# Patient Record
Sex: Male | Born: 1935 | Race: White | Hispanic: No | Marital: Married | Smoking: Never smoker
Health system: Southern US, Community
[De-identification: ages and names within clinical notes are randomized; demographics above are authoritative.]

## PROBLEM LIST (undated history)

## (undated) DIAGNOSIS — I471 Supraventricular tachycardia, unspecified: Secondary | ICD-10-CM

## (undated) DIAGNOSIS — H3530 Unspecified macular degeneration: Secondary | ICD-10-CM

## (undated) DIAGNOSIS — J45909 Unspecified asthma, uncomplicated: Secondary | ICD-10-CM

## (undated) DIAGNOSIS — K5732 Diverticulitis of large intestine without perforation or abscess without bleeding: Secondary | ICD-10-CM

## (undated) DIAGNOSIS — I739 Peripheral vascular disease, unspecified: Secondary | ICD-10-CM

## (undated) DIAGNOSIS — I495 Sick sinus syndrome: Secondary | ICD-10-CM

## (undated) DIAGNOSIS — I110 Hypertensive heart disease with heart failure: Secondary | ICD-10-CM

## (undated) DIAGNOSIS — Z952 Presence of prosthetic heart valve: Secondary | ICD-10-CM

## (undated) DIAGNOSIS — R339 Retention of urine, unspecified: Secondary | ICD-10-CM

## (undated) DIAGNOSIS — I38 Endocarditis, valve unspecified: Secondary | ICD-10-CM

## (undated) DIAGNOSIS — M1990 Unspecified osteoarthritis, unspecified site: Secondary | ICD-10-CM

## (undated) DIAGNOSIS — D649 Anemia, unspecified: Secondary | ICD-10-CM

## (undated) DIAGNOSIS — I330 Acute and subacute infective endocarditis: Secondary | ICD-10-CM

## (undated) DIAGNOSIS — T826XXA Infection and inflammatory reaction due to cardiac valve prosthesis, initial encounter: Secondary | ICD-10-CM

## (undated) DIAGNOSIS — I10 Essential (primary) hypertension: Secondary | ICD-10-CM

## (undated) DIAGNOSIS — M545 Low back pain, unspecified: Secondary | ICD-10-CM

## (undated) DIAGNOSIS — E785 Hyperlipidemia, unspecified: Secondary | ICD-10-CM

## (undated) DIAGNOSIS — T462X5A Adverse effect of other antidysrhythmic drugs, initial encounter: Secondary | ICD-10-CM

## (undated) DIAGNOSIS — N184 Chronic kidney disease, stage 4 (severe): Secondary | ICD-10-CM

## (undated) DIAGNOSIS — C679 Malignant neoplasm of bladder, unspecified: Secondary | ICD-10-CM

## (undated) DIAGNOSIS — G4733 Obstructive sleep apnea (adult) (pediatric): Secondary | ICD-10-CM

## (undated) DIAGNOSIS — M549 Dorsalgia, unspecified: Secondary | ICD-10-CM

## (undated) DIAGNOSIS — I5042 Chronic combined systolic (congestive) and diastolic (congestive) heart failure: Secondary | ICD-10-CM

## (undated) DIAGNOSIS — J984 Other disorders of lung: Secondary | ICD-10-CM

## (undated) DIAGNOSIS — J309 Allergic rhinitis, unspecified: Secondary | ICD-10-CM

## (undated) DIAGNOSIS — I2510 Atherosclerotic heart disease of native coronary artery without angina pectoris: Secondary | ICD-10-CM

## (undated) DIAGNOSIS — Z8719 Personal history of other diseases of the digestive system: Secondary | ICD-10-CM

## (undated) DIAGNOSIS — I480 Paroxysmal atrial fibrillation: Secondary | ICD-10-CM

## (undated) DIAGNOSIS — I6529 Occlusion and stenosis of unspecified carotid artery: Secondary | ICD-10-CM

## (undated) DIAGNOSIS — C61 Malignant neoplasm of prostate: Secondary | ICD-10-CM

## (undated) DIAGNOSIS — R942 Abnormal results of pulmonary function studies: Secondary | ICD-10-CM

## (undated) DIAGNOSIS — R55 Syncope and collapse: Secondary | ICD-10-CM

## (undated) DIAGNOSIS — K219 Gastro-esophageal reflux disease without esophagitis: Secondary | ICD-10-CM

## (undated) DIAGNOSIS — D696 Thrombocytopenia, unspecified: Secondary | ICD-10-CM

## (undated) DIAGNOSIS — Z950 Presence of cardiac pacemaker: Secondary | ICD-10-CM

## (undated) DIAGNOSIS — G8929 Other chronic pain: Secondary | ICD-10-CM

## (undated) DIAGNOSIS — R7989 Other specified abnormal findings of blood chemistry: Secondary | ICD-10-CM

## (undated) HISTORY — DX: Hyperlipidemia, unspecified: E78.5

## (undated) HISTORY — DX: Malignant neoplasm of bladder, unspecified: C67.9

## (undated) HISTORY — DX: Supraventricular tachycardia: I47.1

## (undated) HISTORY — PX: INSERTION PROSTATE RADIATION SEED: SUR718

## (undated) HISTORY — DX: Malignant neoplasm of prostate: C61

## (undated) HISTORY — DX: Other chronic pain: G89.29

## (undated) HISTORY — DX: Supraventricular tachycardia, unspecified: I47.10

## (undated) HISTORY — PX: INSERT / REPLACE / REMOVE PACEMAKER: SUR710

## (undated) HISTORY — DX: Obstructive sleep apnea (adult) (pediatric): G47.33

## (undated) HISTORY — DX: Occlusion and stenosis of unspecified carotid artery: I65.29

## (undated) HISTORY — PX: CAROTID ENDARTERECTOMY: SUR193

## (undated) HISTORY — DX: Unspecified macular degeneration: H35.30

## (undated) HISTORY — PX: EYE SURGERY: SHX253

## (undated) HISTORY — DX: Diverticulitis of large intestine without perforation or abscess without bleeding: K57.32

## (undated) HISTORY — PX: APPENDECTOMY: SHX54

## (undated) HISTORY — PX: TONSILLECTOMY: SUR1361

## (undated) HISTORY — PX: CATARACT EXTRACTION W/ INTRAOCULAR LENS  IMPLANT, BILATERAL: SHX1307

## (undated) HISTORY — DX: Paroxysmal atrial fibrillation: I48.0

## (undated) HISTORY — DX: Low back pain: M54.5

## (undated) HISTORY — DX: Low back pain, unspecified: M54.50

## (undated) HISTORY — DX: Unspecified osteoarthritis, unspecified site: M19.90

## (undated) HISTORY — DX: Allergic rhinitis, unspecified: J30.9

## (undated) HISTORY — DX: Atherosclerotic heart disease of native coronary artery without angina pectoris: I25.10

---

## 1979-06-03 HISTORY — PX: COLON SURGERY: SHX602

## 2007-06-03 HISTORY — PX: INGUINAL HERNIA REPAIR: SUR1180

## 2015-04-01 ENCOUNTER — Encounter (HOSPITAL_COMMUNITY): Payer: Self-pay | Admitting: *Deleted

## 2015-04-01 ENCOUNTER — Emergency Department (HOSPITAL_COMMUNITY)
Admission: EM | Admit: 2015-04-01 | Discharge: 2015-04-01 | Disposition: A | Discharge status: Home / Self Care | Payer: Medicare Other | Attending: Emergency Medicine | Admitting: Emergency Medicine

## 2015-04-01 DIAGNOSIS — I10 Essential (primary) hypertension: Secondary | ICD-10-CM | POA: Insufficient documentation

## 2015-04-01 DIAGNOSIS — Z8546 Personal history of malignant neoplasm of prostate: Secondary | ICD-10-CM | POA: Diagnosis not present

## 2015-04-01 DIAGNOSIS — Y92000 Kitchen of unspecified non-institutional (private) residence as  the place of occurrence of the external cause: Secondary | ICD-10-CM | POA: Diagnosis not present

## 2015-04-01 DIAGNOSIS — Z23 Encounter for immunization: Secondary | ICD-10-CM | POA: Insufficient documentation

## 2015-04-01 DIAGNOSIS — Y9389 Activity, other specified: Secondary | ICD-10-CM | POA: Diagnosis not present

## 2015-04-01 DIAGNOSIS — S61411A Laceration without foreign body of right hand, initial encounter: Secondary | ICD-10-CM | POA: Diagnosis not present

## 2015-04-01 DIAGNOSIS — Y998 Other external cause status: Secondary | ICD-10-CM | POA: Insufficient documentation

## 2015-04-01 DIAGNOSIS — W231XXA Caught, crushed, jammed, or pinched between stationary objects, initial encounter: Secondary | ICD-10-CM | POA: Diagnosis not present

## 2015-04-01 HISTORY — DX: Essential (primary) hypertension: I10

## 2015-04-01 MED ORDER — ACETAMINOPHEN 325 MG PO TABS
650.0000 mg | ORAL_TABLET | Freq: Once | ORAL | Status: AC
  Administered 2015-04-01: 650 mg via ORAL
  Filled 2015-04-01: qty 2

## 2015-04-01 MED ORDER — TETANUS-DIPHTH-ACELL PERTUSSIS 5-2.5-18.5 LF-MCG/0.5 IM SUSP
0.5000 mL | Freq: Once | INTRAMUSCULAR | Status: AC
  Administered 2015-04-01: 0.5 mL via INTRAMUSCULAR
  Filled 2015-04-01: qty 0.5

## 2015-04-01 MED ORDER — LIDOCAINE-EPINEPHRINE (PF) 2 %-1:200000 IJ SOLN
10.0000 mL | Freq: Once | INTRAMUSCULAR | Status: AC
  Administered 2015-04-01: 10 mL
  Filled 2015-04-01: qty 20

## 2015-04-01 NOTE — ED Notes (Signed)
Pt is here with laceration to right hand.  Pt was trying to open a kitchen window and got it caught on a seal.  Unknown last tetanus.

## 2015-04-01 NOTE — ED Provider Notes (Signed)
CSN: [id number]     Arrival date & time 04/01/15  1051 History  By signing my name below, I, Clement Sayres, attest that this documentation has been prepared under the direction and in the presence of Lexi Conaty, PA-C.  Electronically Signed: Clement Sayres, ED Scribe. 04/01/2015. 12:33 PM.   Chief Complaint  Patient presents with  . Laceration   The history is provided by the patient. No language interpreter was used.   HPI Comments: Ryan [name] is a 81 y.o. male who presents to the Emergency Department complaining of non-painful laceration on dorsum of right hand that occurred PTA (bleeding controlled). Pt has not tried any treatments. Pt was opening a window with both arms outstretched. He was pulled off balance and his right hand was caught on the window seal. Pt is unsure when last tetanus shot was. Pt denies weakness, numbness, or any other injury.  He is not on blood thinners.   Past Medical History  Diagnosis Date  . Cancer [facility])     prostate  . Hypertension    Past Surgical History  Procedure Laterality Date  . Carotid endarterectomy      bilateral  . Appendectomy     No family history on file. Social History  Substance Use Topics  . Smoking status: Never Smoker   . Smokeless tobacco: None  . Alcohol Use: No    Review of Systems  Constitutional: Negative for fever.  Musculoskeletal: Negative for arthralgias.  Skin: Positive for wound. Negative for color change.  Allergic/Immunologic: Negative for immunocompromised state.  Neurological: Negative for weakness and numbness.  Hematological: Does not bruise/bleed easily.  Psychiatric/Behavioral: Positive for self-injury (accidental).      Allergies  Ciprofloxacin  Home Medications   Prior to Admission medications   Not on File   BP 152/71 mmHg  Pulse 71  Temp(Src) 97.5 F (36.4 C) (Oral)  Resp 18  SpO2 100% Physical Exam  Constitutional: He appears well-developed and well-nourished. No distress.   HENT:  Head: Normocephalic and atraumatic.  Neck: Neck supple.  Pulmonary/Chest: Effort normal.  Musculoskeletal:  Superficial laceration/skin tear across dorsum of right hand,  Full active ROM 5/5 in all five fingers Sensation in tact, cap refill < 3 seconds, small abrasion/skin tear on dorsum of left hand.  Neurological: He is alert.  Skin: He is not diaphoretic.  Nursing note and vitals reviewed.   ED Course  Procedures DIAGNOSTIC STUDIES: Oxygen Saturation is 100% on RA, normal by my interpretation.    12:16 PM COORDINATION OF CARE: Discussed treatment plan of updating tetanus and laceration pair. Pt agreed to plan.   Labs Review Labs Reviewed - No data to display  Imaging Review No results found. I have personally reviewed and evaluated these images and lab results as part of my medical decision-making.   EKG Interpretation None       12:11 PM Dr Oleta Mouse made aware of the patient.  She will also see and examine patient.   LACERATION REPAIR Performed by: Clayton Bibles Authorized by: Clayton Bibles Consent: Verbal consent obtained. Risks and benefits: risks, benefits and alternatives were discussed Consent given by: patient Patient identity confirmed: provided demographic data Prepped and Draped in normal sterile fashion Wound explored  Laceration Location: dorsal right hand  Laceration Length: 5cm  No Foreign Bodies seen or palpated  Anesthesia: local infiltration  Local anesthetic: lidocaine 2% with epinephrine  Anesthetic total: 5 ml  Irrigation method: syringe Amount of cleaning: standard  Skin closure: 5-0 vicryl plus  Number of sutures: 8  Technique: simple interrupted  Patient tolerance: Patient tolerated the procedure well with no immediate complications.   MDM   Final diagnoses:  Hand laceration, right, initial encounter    Afebrile, nontoxic patient with laceration to right dorsal hand without tendon involvement.  Neurovascularly intact.   Sutures placed in ED.   D/C home with wound care instructions, return precautions.  Discussed result, findings, treatment, and follow up  with patient.  Pt given return precautions.  Pt verbalizes understanding and agrees with plan.       I personally performed the services described in this documentation, which was scribed in my presence. The recorded information has been reviewed and is accurate.    [place], PA-C 04/01/15 [place] Liu, MD 04/01/15 [phone]

## 2015-04-01 NOTE — ED Notes (Signed)
Suture cart at bedside 

## 2015-04-01 NOTE — ED Notes (Signed)
Patient advised no eating or drinking until evaluated by PA

## 2015-04-01 NOTE — ED Notes (Signed)
Declined W/C at D/C and was escorted to lobby by RN. 

## 2015-04-01 NOTE — Discharge Instructions (Signed)
Read the information below.  You may return to the Emergency Department at any time for worsening condition or any new symptoms that concern you.  If you develop redness, swelling, pus draining from the wound, or fevers greater than 100.4, return to the ER immediately for a recheck.    The sutures are absorbable.  If you prefer to have them taken out you may see your primary care provider, the urgent care, or emergency department in 12-14 days for a wound check and suture removal.     Laceration Care, Adult A laceration is a cut that goes through all of the layers of the skin and into the tissue that is right under the skin. Some lacerations heal on their own. Others need to be closed with stitches (sutures), staples, skin adhesive strips, or skin glue. Proper laceration care minimizes the risk of infection and helps the laceration to heal better. HOW TO CARE FOR YOUR LACERATION If sutures or staples were used:  Keep the wound clean and dry.  If you were given a bandage (dressing), you should change it at least one time per day or as told by your health care provider. You should also change it if it becomes wet or dirty.  Keep the wound completely dry for the first 24 hours or as told by your health care provider. After that time, you may shower or bathe. However, make sure that the wound is not soaked in water until after the sutures or staples have been removed.  Clean the wound one time each day or as told by your health care provider:  Wash the wound with soap and water.  Rinse the wound with water to remove all soap.  Pat the wound dry with a clean towel. Do not rub the wound.  After cleaning the wound, apply a thin layer of antibiotic ointmentas told by your health care provider. This will help to prevent infection and keep the dressing from sticking to the wound.  Have the sutures or staples removed as told by your health care provider. If skin adhesive strips were used:  Keep  the wound clean and dry.  If you were given a bandage (dressing), you should change it at least one time per day or as told by your health care provider. You should also change it if it becomes dirty or wet.  Do not get the skin adhesive strips wet. You may shower or bathe, but be careful to keep the wound dry.  If the wound gets wet, pat it dry with a clean towel. Do not rub the wound.  Skin adhesive strips fall off on their own. You may trim the strips as the wound heals. Do not remove skin adhesive strips that are still stuck to the wound. They will fall off in time. If skin glue was used:  Try to keep the wound dry, but you may briefly wet it in the shower or bath. Do not soak the wound in water, such as by swimming.  After you have showered or bathed, gently pat the wound dry with a clean towel. Do not rub the wound.  Do not do any activities that will make you sweat heavily until the skin glue has fallen off on its own.  Do not apply liquid, cream, or ointment medicine to the wound while the skin glue is in place. Using those may loosen the film before the wound has healed.  If you were given a bandage (dressing), you should change  it at least one time per day or as told by your health care provider. You should also change it if it becomes dirty or wet.  If a dressing is placed over the wound, be careful not to apply tape directly over the skin glue. Doing that may cause the glue to be pulled off before the wound has healed.  Do not pick at the glue. The skin glue usually remains in place for 5-10 days, then it falls off of the skin. General Instructions  Take over-the-counter and prescription medicines only as told by your health care provider.  If you were prescribed an antibiotic medicine or ointment, take or apply it as told by your doctor. Do not stop using it even if your condition improves.  To help prevent scarring, make sure to cover your wound with sunscreen whenever you  are outside after stitches are removed, after adhesive strips are removed, or when glue remains in place and the wound is healed. Make sure to wear a sunscreen of at least 30 SPF.  Do not scratch or pick at the wound.  Keep all follow-up visits as told by your health care provider. This is important.  Check your wound every day for signs of infection. Watch for:  Redness, swelling, or pain.  Fluid, blood, or pus.  Raise (elevate) the injured area above the level of your heart while you are sitting or lying down, if possible. SEEK MEDICAL CARE IF:  You received a tetanus shot and you have swelling, severe pain, redness, or bleeding at the injection site.  You have a fever.  A wound that was closed breaks open.  You notice a bad smell coming from your wound or your dressing.  You notice something coming out of the wound, such as wood or glass.  Your pain is not controlled with medicine.  You have increased redness, swelling, or pain at the site of your wound.  You have fluid, blood, or pus coming from your wound.  You notice a change in the color of your skin near your wound.  You need to change the dressing frequently due to fluid, blood, or pus draining from the wound.  You develop a new rash.  You develop numbness around the wound. SEEK IMMEDIATE MEDICAL CARE IF:  You develop severe swelling around the wound.  Your pain suddenly increases and is severe.  You develop painful lumps near the wound or on skin that is anywhere on your body.  You have a red streak going away from your wound.  The wound is on your hand or foot and you cannot properly move a finger or toe.  The wound is on your hand or foot and you notice that your fingers or toes look pale or bluish.   This information is not intended to replace advice given to you by your health care provider. Make sure you discuss any questions you have with your health care provider.   Document Released: 05/19/2005  Document Revised: 10/03/2014 Document Reviewed: 05/15/2014 Elsevier Interactive Patient Education [employer].

## 2015-07-04 DIAGNOSIS — L570 Actinic keratosis: Secondary | ICD-10-CM | POA: Diagnosis not present

## 2015-07-04 DIAGNOSIS — D1722 Benign lipomatous neoplasm of skin and subcutaneous tissue of left arm: Secondary | ICD-10-CM | POA: Diagnosis not present

## 2015-07-04 DIAGNOSIS — L820 Inflamed seborrheic keratosis: Secondary | ICD-10-CM | POA: Diagnosis not present

## 2015-07-04 DIAGNOSIS — L821 Other seborrheic keratosis: Secondary | ICD-10-CM | POA: Diagnosis not present

## 2015-07-04 DIAGNOSIS — D1801 Hemangioma of skin and subcutaneous tissue: Secondary | ICD-10-CM | POA: Diagnosis not present

## 2015-08-07 DIAGNOSIS — H2513 Age-related nuclear cataract, bilateral: Secondary | ICD-10-CM | POA: Diagnosis not present

## 2015-08-07 DIAGNOSIS — H401222 Low-tension glaucoma, left eye, moderate stage: Secondary | ICD-10-CM | POA: Diagnosis not present

## 2015-08-07 DIAGNOSIS — H401213 Low-tension glaucoma, right eye, severe stage: Secondary | ICD-10-CM | POA: Diagnosis not present

## 2015-08-07 DIAGNOSIS — H353131 Nonexudative age-related macular degeneration, bilateral, early dry stage: Secondary | ICD-10-CM | POA: Diagnosis not present

## 2015-08-07 DIAGNOSIS — H52223 Regular astigmatism, bilateral: Secondary | ICD-10-CM | POA: Diagnosis not present

## 2015-09-25 ENCOUNTER — Telehealth: Payer: Self-pay | Admitting: Cardiology

## 2015-09-25 NOTE — Progress Notes (Signed)
Patient ID: Ryan [name], male   DOB: August 28, 1929, 82 y.o.   MRN: [id number]     Cardiology Office Note    Date:  09/26/2015   ID:  Ryan [name], DOB 08-30-1929, MRN [id number]  PCP:  Velna Hatchet, MD  Cardiologist:  Dorothy Spark, MD   Chief complain: Fatigue and dyspnea on exertion.  History of Present Illness:  Ryan [name] is a 82 y.o. male who is a very pleasant patient who despite his age remains very active. He is a former runner who is now struggling with multiple comorbidities and arthritis but he continues to walk with a walker 2 miles a day. Ryan [name] has a history of peripheral arterial disease with carotid endarterectomy bilaterally for severe stenosis in 1997, hypertension, hyperlipidemia, history of SVT, history of aortic stenosis mild to moderate on echocardiogram in 2015, obstructive sleep apnea that intolerance to Cipro admission as the patient is claustrophobic, patient also has known nonobstructive coronary artery disease with 2 cardiac caths in the past last done in 2013 that showed diffusely calcified vessel with mild left main disease, 40% LAD disease, 60% diffuse circumflex disease and 60% diffuse RCA disease, his LVEF was preserved estimated at 65%. Lexiscan stress test in October 2015 was negative for prior infarction or ischemia and showed persistent normal LVEF. Patient also has history of mildly to moderately elevated pulmonary pressures. He was intolerance to carvedilol, metoprolol, and losartan in the past with a drop in his blood pressure down to 90s. The patient states that on 2 occasions he went to the hospital when he developed chest pain associated pain in his neck that responded to PPIs and was believed to be related to his gastric reflux. He currently continues to be very active but is very limited Ryan [name] than what he is usually used to NSAIDs that it's progressively getting worse to the point when he has to make her frequent stops. He is currently not  on any statin, his last cholesterol measurement was in 2012 with LDL 73 triglycerides 131 and HDL 65. He denies any orthopnea, paroxysmal nocturnal dyspnea no lower extremity edema.   Past Medical History  Diagnosis Date  . Prostate cancer ([place])   . Hypertension   . HLD (hyperlipidemia)   . OSA (obstructive sleep apnea)     intolerant to CPAP  . Age-related macular degeneration   . Aortic valve stenosis   . Coronary arteriosclerosis in native artery   . Aortic valve disorder   . Paroxysmal supraventricular tachycardia ([place])   . Carotid artery obstruction   . Allergic rhinitis   . Diverticulitis of colon   . Chronic lower back pain   . Nocturnal hypoxemia   . DJD (degenerative joint disease)   . History of elevated PSA 05/2009  . Primary malignant neoplasm of bladder ([place])   . Dysthymia     Past Surgical History  Procedure Laterality Date  . Carotid endarterectomy      bilateral  . Appendectomy    . Hernia repair Right 2009  . Abdominal surgery  1981    Meckles Diverticulum with volvulus    Current Medications: Outpatient Prescriptions Prior to Visit  Medication Sig Dispense Refill  . amLODipine (NORVASC) 10 MG tablet Take 10 mg by mouth daily.    Marland Kitchen aspirin 81 MG tablet Take 81 mg by mouth daily.    . cholecalciferol (VITAMIN D) 1000 units tablet Take 1,000 Units by mouth daily.    . Coenzyme Q10 (COQ-10 PO) Take 1  capsule by mouth daily.    . Dorzolamide HCl-Timolol Mal PF 22.3-6.8 MG/ML SOLN Place 1 drop into both eyes at bedtime.    Marland Kitchen latanoprost (XALATAN) 0.005 % ophthalmic solution Place 1 drop into both eyes at bedtime.    Marland Kitchen lisinopril (PRINIVIL,ZESTRIL) 20 MG tablet Take 20 mg by mouth daily.    . Probiotic Product (PROBIOTIC PO) Take 1 tablet by mouth daily.    Marland Kitchen triamcinolone cream (KENALOG) 0.1 % Apply on to skin twice daily  6   No facility-administered medications prior to visit.     Allergies:   Hydrochlorothiazide; Ace inhibitors; Ciprofloxacin;  Losartan; and Sulfa antibiotics   Social History   Social History  . Marital Status: Married    Spouse Name: N/A  . Number of Children: N/A  . Years of Education: N/A   Social History Main Topics  . Smoking status: Never Smoker   . Smokeless tobacco: Never Used  . Alcohol Use: No  . Drug Use: No  . Sexual Activity: Not Asked   Other Topics Concern  . None   Social History Narrative     Family History:  The patient's family history includes Heart disease in his father and mother.   ROS:   Please see the history of present illness.    ROS All other systems reviewed and are negative.   PHYSICAL EXAM:   VS:  BP 160/66 mmHg  Pulse 66  Ht 5\' 7"  (1.702 m)  Wt 128 lb (58.06 kg)  BMI 20.04 kg/m2   GEN: Well nourished, well developed, in no acute distress HEENT: normal Neck: no JVD, carotid bruits, or masses Cardiac: RRR; 4/6 holosystolic murmurs, very soft S2, rubs, or gallops,no edema  Respiratory:  clear to auscultation bilaterally, normal work of breathing GI: soft, nontender, nondistended, + BS MS: no deformity or atrophy Skin: warm and dry, no rash Neuro:  Alert and Oriented x 3, Strength and sensation are intact Psych: euthymic mood, full affect  Wt Readings from Last 3 Encounters:  09/26/15 128 lb (58.06 kg)    Studies/Labs Reviewed:   EKG:  EKG is ordered today.  The ekg ordered today demonstrates Sinus rhythm with ventricular related 66 bpm, right axis deviation, left bundle branch block, there is no prior EKG available for comparison.  Recent Labs: No results found for requested labs within last 365 days.   Lipid Panel as described in history of present illness  Additional studies/ records that were reviewed today include:  I have reviewed his prior medical records including office visits, cardiac cath, stress test, and echocardiogram. All described in history of present illness.    ASSESSMENT:    1. Aortic stenosis   2. Essential hypertension     3. Coronary artery disease involving native coronary artery of native heart without angina pectoris    4. Peripheral arterial disease 5. Hyperlipidemia  PLAN:  In order of problems listed above:  1. Coronary artery disease - known nonobstructive, he is on medication with aspirin lisinopril, no beta blockers at as he is bradycardic at baseline and didn't tolerate in the past. He is not on statin most probably secondary to memory impairment in the past. His EKG shows left bundle branch block that was previously not described, as of now I want to rule out severe aortic stenosis that might be responsible for his symptoms of worsening exercise capacity and shortness of breath on exertion if he doesn't have aortic stenosis that is significant for causing the symptoms I  will consider ischemic workup in the future.  2. Aortic stenosis - mild to moderate on the echocardiogram in 2015, however loud murmur with worsening symptoms, and soft S2 we will repeat echocardiogram to evaluate.  3. Hypertension - most probably white coat syndrome as he states it's always in 130s at home and history of hypotension in the past for now I'll just follow current medical regimen  4. Hyperlipidemia - acceptable lipids in 2012, he is going to have them repeated today we will follow. No statins as he had memory impairment in the past.  5. Carotid disease status post endarterectomy in 1997, he currently has no bruits and no significant dizziness.  6. Left bundle branch block those previous not described on prior EKGs will consider ischemic workup in the future if other causes for his symptoms.  Medication Adjustments/Labs and Tests Ordered: Current medicines are reviewed at length with the patient today.  Concerns regarding medicines are outlined above.  Medication changes, Labs and Tests ordered today are listed in the Patient Instructions below. Patient Instructions  Medication Instructions:  Your physician recommends  that you continue on your current medications as directed. Please refer to the Current Medication list given to you today.   Labwork: Please have a copy of your labs ordered by Dr.Holwerda forwarded to our office Fax # [phone]  Testing/Procedures: Your physician has requested that you have an echocardiogram. Echocardiography is a painless test that uses sound waves to create images of your heart. It provides your doctor with information about the size and shape of your heart and how well your heart's chambers and valves are working. This procedure takes approximately one hour. There are no restrictions for this procedure.    Follow-Up: Your physician recommends that you schedule a follow-up appointment in:  Dr.Nelsons next available or 2 months  Any Other Special Instructions Will Be Listed Below (If Applicable).     If you need a refill on your cardiac medications before your next appointment, please call your pharmacy.       Signed, Dorothy Spark, MD  09/26/2015 10:20 AM    [facility] [address], [place], [place]  [address] Phone: [phone]; Fax: [phone]

## 2015-09-25 NOTE — Telephone Encounter (Signed)
Records rec From [facility] appointment 4/26 with Dr.Nelson gave to [facility]

## 2015-09-26 ENCOUNTER — Encounter: Payer: Self-pay | Admitting: Cardiology

## 2015-09-26 ENCOUNTER — Encounter: Payer: Self-pay | Admitting: *Deleted

## 2015-09-26 ENCOUNTER — Ambulatory Visit (INDEPENDENT_AMBULATORY_CARE_PROVIDER_SITE_OTHER): Payer: Medicare Other | Admitting: Cardiology

## 2015-09-26 VITALS — BP 160/66 | HR 66 | Ht 67.0 in | Wt 128.0 lb

## 2015-09-26 DIAGNOSIS — E785 Hyperlipidemia, unspecified: Secondary | ICD-10-CM | POA: Diagnosis not present

## 2015-09-26 DIAGNOSIS — I350 Nonrheumatic aortic (valve) stenosis: Secondary | ICD-10-CM | POA: Diagnosis not present

## 2015-09-26 DIAGNOSIS — I2510 Atherosclerotic heart disease of native coronary artery without angina pectoris: Secondary | ICD-10-CM | POA: Diagnosis not present

## 2015-09-26 DIAGNOSIS — I10 Essential (primary) hypertension: Secondary | ICD-10-CM

## 2015-09-26 DIAGNOSIS — I447 Left bundle-branch block, unspecified: Secondary | ICD-10-CM

## 2015-09-26 DIAGNOSIS — Z125 Encounter for screening for malignant neoplasm of prostate: Secondary | ICD-10-CM | POA: Diagnosis not present

## 2015-09-26 DIAGNOSIS — I779 Disorder of arteries and arterioles, unspecified: Secondary | ICD-10-CM

## 2015-09-26 DIAGNOSIS — I739 Peripheral vascular disease, unspecified: Secondary | ICD-10-CM

## 2015-09-26 NOTE — Patient Instructions (Addendum)
Medication Instructions:  Your physician recommends that you continue on your current medications as directed. Please refer to the Current Medication list given to you today.   Labwork: Please have a copy of your labs ordered by Dr.Holwerda forwarded to our office Fax # [phone]  Testing/Procedures: Your physician has requested that you have an echocardiogram. Echocardiography is a painless test that uses sound waves to create images of your heart. It provides your doctor with information about the size and shape of your heart and how well your heart's chambers and valves are working. This procedure takes approximately one hour. There are no restrictions for this procedure.    Follow-Up: Your physician recommends that you schedule a follow-up appointment in:  Dr.Nelsons next available or 2 months  Any Other Special Instructions Will Be Listed Below (If Applicable).     If you need a refill on your cardiac medications before your next appointment, please call your pharmacy.

## 2015-10-01 DIAGNOSIS — Z0000 Encounter for general adult medical examination without abnormal findings: Secondary | ICD-10-CM | POA: Diagnosis not present

## 2015-10-01 DIAGNOSIS — C61 Malignant neoplasm of prostate: Secondary | ICD-10-CM | POA: Diagnosis not present

## 2015-10-01 DIAGNOSIS — C671 Malignant neoplasm of dome of bladder: Secondary | ICD-10-CM | POA: Diagnosis not present

## 2015-10-03 DIAGNOSIS — H3530 Unspecified macular degeneration: Secondary | ICD-10-CM | POA: Diagnosis not present

## 2015-10-03 DIAGNOSIS — I350 Nonrheumatic aortic (valve) stenosis: Secondary | ICD-10-CM | POA: Diagnosis not present

## 2015-10-03 DIAGNOSIS — I10 Essential (primary) hypertension: Secondary | ICD-10-CM | POA: Diagnosis not present

## 2015-10-03 DIAGNOSIS — H4089 Other specified glaucoma: Secondary | ICD-10-CM | POA: Diagnosis not present

## 2015-10-03 DIAGNOSIS — I447 Left bundle-branch block, unspecified: Secondary | ICD-10-CM | POA: Diagnosis not present

## 2015-10-03 DIAGNOSIS — I6529 Occlusion and stenosis of unspecified carotid artery: Secondary | ICD-10-CM | POA: Diagnosis not present

## 2015-10-03 DIAGNOSIS — Z0000 Encounter for general adult medical examination without abnormal findings: Secondary | ICD-10-CM | POA: Diagnosis not present

## 2015-10-03 DIAGNOSIS — Z8546 Personal history of malignant neoplasm of prostate: Secondary | ICD-10-CM | POA: Diagnosis not present

## 2015-10-03 DIAGNOSIS — M1990 Unspecified osteoarthritis, unspecified site: Secondary | ICD-10-CM | POA: Diagnosis not present

## 2015-10-03 DIAGNOSIS — I25118 Atherosclerotic heart disease of native coronary artery with other forms of angina pectoris: Secondary | ICD-10-CM | POA: Diagnosis not present

## 2015-10-05 ENCOUNTER — Other Ambulatory Visit (HOSPITAL_COMMUNITY): Payer: Medicare Other

## 2015-10-10 ENCOUNTER — Other Ambulatory Visit: Payer: Self-pay

## 2015-10-10 ENCOUNTER — Ambulatory Visit (HOSPITAL_COMMUNITY): Payer: Medicare Other | Attending: Internal Medicine

## 2015-10-10 DIAGNOSIS — I350 Nonrheumatic aortic (valve) stenosis: Secondary | ICD-10-CM | POA: Diagnosis not present

## 2015-10-10 DIAGNOSIS — Z8249 Family history of ischemic heart disease and other diseases of the circulatory system: Secondary | ICD-10-CM | POA: Diagnosis not present

## 2015-10-10 DIAGNOSIS — I2510 Atherosclerotic heart disease of native coronary artery without angina pectoris: Secondary | ICD-10-CM | POA: Insufficient documentation

## 2015-10-10 DIAGNOSIS — I071 Rheumatic tricuspid insufficiency: Secondary | ICD-10-CM | POA: Insufficient documentation

## 2015-10-10 DIAGNOSIS — E785 Hyperlipidemia, unspecified: Secondary | ICD-10-CM | POA: Diagnosis not present

## 2015-10-10 DIAGNOSIS — I447 Left bundle-branch block, unspecified: Secondary | ICD-10-CM | POA: Insufficient documentation

## 2015-10-10 DIAGNOSIS — I340 Nonrheumatic mitral (valve) insufficiency: Secondary | ICD-10-CM | POA: Diagnosis not present

## 2015-10-10 DIAGNOSIS — G4733 Obstructive sleep apnea (adult) (pediatric): Secondary | ICD-10-CM | POA: Insufficient documentation

## 2015-10-10 DIAGNOSIS — I10 Essential (primary) hypertension: Secondary | ICD-10-CM | POA: Diagnosis not present

## 2015-10-10 DIAGNOSIS — I119 Hypertensive heart disease without heart failure: Secondary | ICD-10-CM | POA: Diagnosis not present

## 2015-10-12 ENCOUNTER — Telehealth: Payer: Self-pay | Admitting: *Deleted

## 2015-10-12 DIAGNOSIS — I350 Nonrheumatic aortic (valve) stenosis: Secondary | ICD-10-CM | POA: Insufficient documentation

## 2015-10-12 NOTE — Telephone Encounter (Signed)
Notified the pt that per Dr Meda Coffee, his echo showed that his LVEF is normal, aortic stenosis is now moderate, no intervention needed, and we will follow with an echo in 1 year.  Informed the pt that I will place the order for the echo in the system, and have one of our [facility] schedulers call the pt back to schedule his echo for one year out.  Pt verbalized understanding and agrees with this plan.

## 2015-10-12 NOTE — Telephone Encounter (Signed)
-----   Message from Dorothy Spark, MD sent at 10/10/2015 10:42 PM EDT ----- LVEF is normal, aortic stenosis is now moderate, no intervention needed, we will follow with an echo in 1 year

## 2015-10-31 DIAGNOSIS — M25561 Pain in right knee: Secondary | ICD-10-CM | POA: Diagnosis not present

## 2015-10-31 DIAGNOSIS — M1711 Unilateral primary osteoarthritis, right knee: Secondary | ICD-10-CM | POA: Diagnosis not present

## 2015-11-23 ENCOUNTER — Ambulatory Visit: Payer: Medicare Other | Admitting: Cardiology

## 2015-11-28 ENCOUNTER — Ambulatory Visit: Payer: Medicare Other | Admitting: Cardiology

## 2015-12-11 DIAGNOSIS — H353131 Nonexudative age-related macular degeneration, bilateral, early dry stage: Secondary | ICD-10-CM | POA: Diagnosis not present

## 2015-12-11 DIAGNOSIS — H401213 Low-tension glaucoma, right eye, severe stage: Secondary | ICD-10-CM | POA: Diagnosis not present

## 2015-12-11 DIAGNOSIS — H52223 Regular astigmatism, bilateral: Secondary | ICD-10-CM | POA: Diagnosis not present

## 2015-12-11 DIAGNOSIS — H401222 Low-tension glaucoma, left eye, moderate stage: Secondary | ICD-10-CM | POA: Diagnosis not present

## 2015-12-11 DIAGNOSIS — H2513 Age-related nuclear cataract, bilateral: Secondary | ICD-10-CM | POA: Diagnosis not present

## 2015-12-26 DIAGNOSIS — G8929 Other chronic pain: Secondary | ICD-10-CM | POA: Diagnosis not present

## 2015-12-26 DIAGNOSIS — M25561 Pain in right knee: Secondary | ICD-10-CM | POA: Diagnosis not present

## 2016-01-07 ENCOUNTER — Telehealth: Payer: Self-pay | Admitting: *Deleted

## 2016-01-07 NOTE — Telephone Encounter (Signed)
Spoke with the pt and informed him that Dr Meda Coffee spoke with his son-in-law and informed him of her recommendations for him to have a left cardiac cath done, with labs prior too, for recent symptoms.  Pt states that he is agreeable to the plan, but prefers to come into the office to see Dr Meda Coffee on Monday 8/14 at 0930, to discuss this in further detail.  Informed the pt that would be perfectly fine, for she suggested this as well.  Scheduled the pt an appt with Dr Meda Coffee on 8/14 at 0930 to discuss and set up cath, with pre-cath labs same day.  Pt verbalized understanding and agrees with this plan.  Pt gracious for all the assistance provided.

## 2016-01-07 NOTE — Telephone Encounter (Signed)
-----   Message from Dorothy Spark, MD sent at 01/07/2016  5:45 AM EDT ----- Karlene Einstein,  Please schedule this patient for a left cardiac cath. He will need labs - CMP, CBC, INR. THank you, [place] Siren

## 2016-01-08 DIAGNOSIS — Z85828 Personal history of other malignant neoplasm of skin: Secondary | ICD-10-CM | POA: Diagnosis not present

## 2016-01-08 DIAGNOSIS — D1722 Benign lipomatous neoplasm of skin and subcutaneous tissue of left arm: Secondary | ICD-10-CM | POA: Diagnosis not present

## 2016-01-08 DIAGNOSIS — L570 Actinic keratosis: Secondary | ICD-10-CM | POA: Diagnosis not present

## 2016-01-08 DIAGNOSIS — L821 Other seborrheic keratosis: Secondary | ICD-10-CM | POA: Diagnosis not present

## 2016-01-08 DIAGNOSIS — C44329 Squamous cell carcinoma of skin of other parts of face: Secondary | ICD-10-CM | POA: Diagnosis not present

## 2016-01-08 DIAGNOSIS — L820 Inflamed seborrheic keratosis: Secondary | ICD-10-CM | POA: Diagnosis not present

## 2016-01-08 DIAGNOSIS — D2261 Melanocytic nevi of right upper limb, including shoulder: Secondary | ICD-10-CM | POA: Diagnosis not present

## 2016-01-08 DIAGNOSIS — D225 Melanocytic nevi of trunk: Secondary | ICD-10-CM | POA: Diagnosis not present

## 2016-01-09 ENCOUNTER — Telehealth: Payer: Self-pay | Admitting: Cardiology

## 2016-01-09 NOTE — Telephone Encounter (Signed)
New message   Pt verbalized that he is calling for rn he has a missed call  Informed him I will send rn a message and I notified him of his appt on 01/14/16

## 2016-01-09 NOTE — Telephone Encounter (Signed)
Pt just calling to confirm his appt for Monday 8/14 at 0930 with Dr Meda Coffee, and to confirm that we will schedule his cardiac cath then at that appt.  Informed the pt that he does have an appt with [place] on Monday 8/14 at 0930, arrive 15 mins early, and we will schedule his cath while he's in the office on Monday.  Informed the pt that we will give him detailed instructions about his cath at that appt, and will also obtain his pre-cath labs same day too. Pt verbalized understanding and agrees with this plan.  Pt gracious for all the assistance provided.

## 2016-01-14 ENCOUNTER — Ambulatory Visit (INDEPENDENT_AMBULATORY_CARE_PROVIDER_SITE_OTHER): Payer: Medicare Other | Admitting: Cardiology

## 2016-01-14 ENCOUNTER — Encounter: Payer: Self-pay | Admitting: Cardiology

## 2016-01-14 ENCOUNTER — Encounter: Payer: Self-pay | Admitting: *Deleted

## 2016-01-14 VITALS — BP 150/70 | HR 73 | Ht 67.0 in | Wt 131.2 lb

## 2016-01-14 DIAGNOSIS — I350 Nonrheumatic aortic (valve) stenosis: Secondary | ICD-10-CM | POA: Diagnosis not present

## 2016-01-14 DIAGNOSIS — Z01812 Encounter for preprocedural laboratory examination: Secondary | ICD-10-CM

## 2016-01-14 DIAGNOSIS — I2510 Atherosclerotic heart disease of native coronary artery without angina pectoris: Secondary | ICD-10-CM | POA: Diagnosis not present

## 2016-01-14 DIAGNOSIS — I10 Essential (primary) hypertension: Secondary | ICD-10-CM

## 2016-01-14 DIAGNOSIS — R0600 Dyspnea, unspecified: Secondary | ICD-10-CM

## 2016-01-14 DIAGNOSIS — R9431 Abnormal electrocardiogram [ECG] [EKG]: Secondary | ICD-10-CM | POA: Diagnosis not present

## 2016-01-14 DIAGNOSIS — R0609 Other forms of dyspnea: Secondary | ICD-10-CM

## 2016-01-14 DIAGNOSIS — I447 Left bundle-branch block, unspecified: Secondary | ICD-10-CM

## 2016-01-14 LAB — CBC WITH DIFFERENTIAL/PLATELET
Basophils Absolute: 92 cells/uL (ref 0–200)
Basophils Relative: 2 %
Eosinophils Absolute: 138 cells/uL (ref 15–500)
Eosinophils Relative: 3 %
HCT: 47.9 % (ref 38.5–50.0)
Hemoglobin: 15.7 g/dL (ref 13.2–17.1)
Lymphs Abs: 690 cells/uL — ABNORMAL LOW (ref 850–3900)
MCH: 30.7 pg (ref 27.0–33.0)
MCHC: 32.8 g/dL (ref 32.0–36.0)
MCV: 93.7 fL (ref 80.0–100.0)
MPV: 10.1 fL (ref 7.5–12.5)
Monocytes Absolute: 506 cells/uL (ref 200–950)
Monocytes Relative: 11 %
Neutro Abs: 3174 cells/uL (ref 1500–7800)
Neutrophils Relative %: 69 %
Platelets: 252 10*3/uL (ref 140–400)
RBC: 5.11 MIL/uL (ref 4.20–5.80)
RDW: 13.3 % (ref 11.0–15.0)
WBC: 4.6 10*3/uL (ref 3.8–10.8)

## 2016-01-14 LAB — COMPREHENSIVE METABOLIC PANEL
ALT: 17 U/L (ref 9–46)
AST: 21 U/L (ref 10–35)
Albumin: 4.3 g/dL (ref 3.6–5.1)
Alkaline Phosphatase: 74 U/L (ref 40–115)
BUN: 21 mg/dL (ref 7–25)
CO2: 27 mmol/L (ref 20–31)
Calcium: 9.9 mg/dL (ref 8.6–10.3)
Chloride: 104 mmol/L (ref 98–110)
Creat: 0.91 mg/dL (ref 0.70–1.11)
Glucose, Bld: 101 mg/dL — ABNORMAL HIGH (ref 65–99)
Potassium: 4.2 mmol/L (ref 3.5–5.3)
Sodium: 142 mmol/L (ref 135–146)
Total Bilirubin: 0.4 mg/dL (ref 0.2–1.2)
Total Protein: 6.7 g/dL (ref 6.1–8.1)

## 2016-01-14 LAB — PROTIME-INR
INR: 1
Prothrombin Time: 10.5 s (ref 9.0–11.5)

## 2016-01-14 NOTE — Progress Notes (Signed)
Patient ID: Ryan [name], male   DOB: February 29, 1930, 82 y.o.   MRN: [id number]     Cardiology Office Note    Date:  01/14/2016   ID:  Ryan [name], DOB 1930-01-20, MRN [id number]  PCP:  Velna Hatchet, MD  Cardiologist:  Ena Dawley, MD   Chief complain: Dyspnea on exertion.  History of Present Illness:  Ryan [name] is a 82 y.o. male who is a very pleasant patient who despite his age remains very active. He is a former runner who is now struggling with multiple comorbidities and arthritis but he continues to walk with a walker 2 miles a day. Ryan [name] has a history of peripheral arterial disease with carotid endarterectomy bilaterally for severe stenosis in 1997, hypertension, hyperlipidemia, history of SVT, history of aortic stenosis mild to moderate on echocardiogram in 2015, obstructive sleep apnea that intolerance to Cipro admission as the patient is claustrophobic, patient also has known nonobstructive coronary artery disease with 2 cardiac caths in the past last done in 2013 that showed diffusely calcified vessel with mild left main disease, 40% LAD disease, 60% diffuse circumflex disease and 60% diffuse RCA disease, his LVEF was preserved estimated at 65%. Lexiscan stress test in October 2015 was negative for prior infarction or ischemia and showed persistent normal LVEF. Patient also has history of mildly to moderately elevated pulmonary pressures. He was intolerance to carvedilol, metoprolol, and losartan in the past with a drop in his blood pressure down to 90s. The patient states that on 2 occasions he went to the hospital when he developed chest pain associated pain in his neck that responded to PPIs and was believed to be related to his gastric reflux. He currently continues to be very active but is very limited [name] than what he is usually used to NSAIDs that it's progressively getting worse to the point when he has to make her frequent stops. He is  currently not on any statin, his last cholesterol measurement was in 2012 with LDL 73 triglycerides 131 and HDL 65. He denies any orthopnea, paroxysmal nocturnal dyspnea no lower extremity edema.  01/14/2016, the patient is being seen earlier than plain, I was notified by his son-in-law who is a physician at [facility] for concern of worsening fatigue and progressively worsening dyspnea on exertion. The patient has been active his whole life and lately he is unable to finish his walks and swims and he just overall feels very tired. He states that he will be okay to except that this is secondary to his age however with his prior medical history of nonobstructive CAD he wants to make sure this is not coming from his heart. He has been compliant to his meds. He denies any palpitations or syncope no lower extremity edema orthopnea or proximal nocturnal dyspnea.   Past Medical History:  Diagnosis Date  . Age-related macular degeneration   . Allergic rhinitis   . Aortic valve disorder   . Aortic valve stenosis   . Carotid artery obstruction   . Chronic lower back pain   . Coronary arteriosclerosis in native artery   . Diverticulitis of colon   . DJD (degenerative joint disease)   . Dysthymia   . History of elevated PSA 05/2009  . HLD (hyperlipidemia)   . Hypertension   . Nocturnal hypoxemia   . OSA (obstructive sleep apnea)    intolerant to CPAP  . Paroxysmal supraventricular tachycardia ([place])   . Primary malignant neoplasm of bladder ([place])   .  Prostate cancer [facility])     Past Surgical History:  Procedure Laterality Date  . ABDOMINAL SURGERY  1981   Meckles Diverticulum with volvulus  . APPENDECTOMY    . CAROTID ENDARTERECTOMY     bilateral  . HERNIA REPAIR Right 2009    Current Medications: Outpatient Medications Prior to Visit  Medication Sig Dispense Refill  . amLODipine (NORVASC) 10 MG tablet Take 10 mg by mouth daily.    Marland Kitchen aspirin 81 MG tablet Take 81 mg by mouth daily.    .  cholecalciferol (VITAMIN D) 1000 units tablet Take 1,000 Units by mouth daily.    . Coenzyme Q10 (COQ-10 PO) Take 1 capsule by mouth daily.    Marland Kitchen latanoprost (XALATAN) 0.005 % ophthalmic solution Place 1 drop into both eyes at bedtime.    Marland Kitchen lisinopril (PRINIVIL,ZESTRIL) 20 MG tablet Take 20 mg by mouth daily.    Marland Kitchen omeprazole (PRILOSEC) 20 MG capsule Take 20 mg by mouth daily.    . Probiotic Product (PROBIOTIC PO) Take 1 tablet by mouth daily.    . Dorzolamide HCl-Timolol Mal PF 22.3-6.8 MG/ML SOLN Place 1 drop into both eyes at bedtime.    . triamcinolone cream (KENALOG) 0.1 % Apply on to skin twice daily  6   No facility-administered medications prior to visit.      Allergies:   Hydrochlorothiazide; Ace inhibitors; Ciprofloxacin; Losartan; and Sulfa antibiotics   Social History   Social History  . Marital status: Married    Spouse name: N/A  . Number of children: N/A  . Years of education: N/A   Social History Main Topics  . Smoking status: Never Smoker  . Smokeless tobacco: Never Used  . Alcohol use No  . Drug use: No  . Sexual activity: Not Asked   Other Topics Concern  . None   Social History Narrative  . None     Family History:  The patient's family history includes Heart disease in his father and mother.   ROS:   Please see the history of present illness.    ROS All other systems reviewed and are negative.   PHYSICAL EXAM:   VS:  BP (!) 150/70   Pulse 73   Ht 5\' 7"  (1.702 m)   Wt 131 lb 3.2 oz (59.5 kg)   BMI 20.55 kg/m    GEN: Well nourished, well developed, in no acute distress  HEENT: normal  Neck: no JVD, carotid bruits, or masses Cardiac: RRR; 4/6 holosystolic murmurs, very soft S2, rubs, or gallops,no edema  Respiratory:  clear to auscultation bilaterally, normal work of breathing GI: soft, nontender, nondistended, + BS MS: no deformity or atrophy  Skin: warm and dry, no rash Neuro:  Alert and Oriented x 3, Strength and sensation are  intact Psych: euthymic mood, full affect  Wt Readings from Last 3 Encounters:  01/14/16 131 lb 3.2 oz (59.5 kg)  09/26/15 128 lb (58.1 kg)    Studies/Labs Reviewed:   EKG:  EKG is ordered today.  The ekg ordered today demonstrates Sinus rhythm with ventricular related 66 bpm, right axis deviation, left bundle branch block, there is no prior EKG available for comparison.  Recent Labs: No results found for requested labs within last 8760 hours.   Lipid Panel as described in history of present illness  Additional studies/ records that were reviewed today include:  I have reviewed his prior medical records including office visits, cardiac cath, stress test, and echocardiogram. All described in history of present  illness.    ASSESSMENT:    1. Pre-procedure lab exam   2. Moderate aortic stenosis   3. Abnormal EKG   4. Coronary artery disease involving native coronary artery of native heart without angina pectoris   5. Essential hypertension   6. DOE (dyspnea on exertion)   7. LBBB (left bundle branch block)    4. Peripheral arterial disease 5. Hyperlipidemia  PLAN:  In order of problems listed above:  1. Coronary artery disease - known nonobstructive, he is on medication with aspirin lisinopril, no beta blockers at as he is bradycardic at baseline and didn't tolerate in the past. He is not on statin most probably secondary to memory impairment in the past. His EKG shows left bundle branch block that was Present in April 2017 as well, but not prior to that.. We have recently checked his aortic stenosis that it's still in moderate range, and since his symptoms of functional capacity and dyspnea on exertion are progressively worsening we will schedule left cardiac catheterization his records indicate that on a cath in 2013 he had 60% lesion in post RCA and left circumflex artery and 40% lesion in LAD.  2. Aortic stenosis -moderate on the echocardiogram in April 2017 with mean gradient  of 25 mmHg.  3. Hypertension - most probably white coat syndrome as he states it's always in 130s at home and history of hypotension in the past for now I'll just follow current medical regimen  4. Hyperlipidemia - acceptable lipids in 2012, not on statins as he had memory impairment in the past.  5. Carotid disease status post endarterectomy in 1997, he currently has no bruits and no significant dizziness.  6. Left bundle branch block those previous not described on prior EKGs will consider ischemic workup in the future if other causes for his symptoms.  Medication Adjustments/Labs and Tests Ordered: Current medicines are reviewed at length with the patient today.  Concerns regarding medicines are outlined above.  Medication changes, Labs and Tests ordered today are listed in the Patient Instructions below. Patient Instructions  Medication Instructions:   Your physician recommends that you continue on your current medications as directed. Please refer to the Current Medication list given to you today.   Labwork:  TODAY--PRE-CATH LABS-CMET, CBC W DIFF, AND PT/INR   Testing/Procedures:  Your physician has requested that you have a cardiac catheterization. Cardiac catheterization is used to diagnose and/or treat various heart conditions. Doctors may recommend this procedure for a number of different reasons. The most common reason is to evaluate chest pain. Chest pain can be a symptom of coronary artery disease (CAD), and cardiac catheterization can show whether plaque is narrowing or blocking your heart's arteries. This procedure is also used to evaluate the valves, as well as measure the blood flow and oxygen levels in different parts of your heart. For further information please visit [facility]uge[facility]iesta[facility][email or web address]. Please follow instruction sheet, as given.  YOUR CATH IS SCHEDULED FOR THIS Thursday 01/17/16 AT 7:30 AM WITH DR Tamala Julian.  YOU MUST ARRIVE AT [place] AT 5:30 AM ON THE  MORNING OF YOUR CATH.    Follow-Up:  AS PLANNED IN February 20, 2016 AT 8:15 AM WITH DR Meda Coffee      If you need a refill on your cardiac medications before your next appointment, please call your pharmacy.      Signed, Ena Dawley, MD  01/14/2016 1:32 PM    [place], [place]  [address] Phone: [phone]; Fax: [phone]

## 2016-01-14 NOTE — Patient Instructions (Addendum)
Medication Instructions:   Your physician recommends that you continue on your current medications as directed. Please refer to the Current Medication list given to you today.   Labwork:  TODAY--PRE-CATH LABS-CMET, CBC W DIFF, AND PT/INR   Testing/Procedures:  Your physician has requested that you have a cardiac catheterization. Cardiac catheterization is used to diagnose and/or treat various heart conditions. Doctors may recommend this procedure for a number of different reasons. The most common reason is to evaluate chest pain. Chest pain can be a symptom of coronary artery disease (CAD), and cardiac catheterization can show whether plaque is narrowing or blocking your heart's arteries. This procedure is also used to evaluate the valves, as well as measure the blood flow and oxygen levels in different parts of your heart. For further information please visit [facility]uge[facility]iest[facility][email or web address]. Please follow instruction sheet, as given.  YOUR CATH IS SCHEDULED FOR THIS Thursday 01/17/16 AT 7:30 AM WITH DR Tamala Julian.  YOU MUST ARRIVE AT [place] AT 5:30 AM ON THE MORNING OF YOUR CATH.    Follow-Up:  AS PLANNED IN February 20, 2016 AT 8:15 AM WITH DR Meda Coffee      If you need a refill on your cardiac medications before your next appointment, please call your pharmacy.

## 2016-01-16 ENCOUNTER — Telehealth: Payer: Self-pay | Admitting: Cardiology

## 2016-01-16 NOTE — Telephone Encounter (Signed)
Follow Up:; ° ° °Returning your call. °

## 2016-01-16 NOTE — Telephone Encounter (Signed)
Notified the pt that per Dr Meda Coffee, his pre-cath labs were all normal. Pt verbalized understanding.

## 2016-01-17 ENCOUNTER — Ambulatory Visit (HOSPITAL_COMMUNITY)
Admission: RE | Admit: 2016-01-17 | Discharge: 2016-01-17 | Disposition: A | Discharge status: Home / Self Care | Payer: Medicare Other | Source: Ambulatory Visit | Attending: Interventional Cardiology | Admitting: Interventional Cardiology

## 2016-01-17 ENCOUNTER — Encounter (HOSPITAL_COMMUNITY): Payer: Self-pay | Admitting: Interventional Cardiology

## 2016-01-17 ENCOUNTER — Encounter (HOSPITAL_COMMUNITY)
Admission: RE | Disposition: A | Discharge status: Home / Self Care | Payer: Self-pay | Source: Ambulatory Visit | Attending: Interventional Cardiology

## 2016-01-17 DIAGNOSIS — I2584 Coronary atherosclerosis due to calcified coronary lesion: Secondary | ICD-10-CM | POA: Diagnosis not present

## 2016-01-17 DIAGNOSIS — Z7982 Long term (current) use of aspirin: Secondary | ICD-10-CM | POA: Diagnosis not present

## 2016-01-17 DIAGNOSIS — E785 Hyperlipidemia, unspecified: Secondary | ICD-10-CM | POA: Insufficient documentation

## 2016-01-17 DIAGNOSIS — M545 Low back pain: Secondary | ICD-10-CM | POA: Insufficient documentation

## 2016-01-17 DIAGNOSIS — Z8551 Personal history of malignant neoplasm of bladder: Secondary | ICD-10-CM | POA: Insufficient documentation

## 2016-01-17 DIAGNOSIS — Z8546 Personal history of malignant neoplasm of prostate: Secondary | ICD-10-CM | POA: Diagnosis not present

## 2016-01-17 DIAGNOSIS — I447 Left bundle-branch block, unspecified: Secondary | ICD-10-CM | POA: Diagnosis not present

## 2016-01-17 DIAGNOSIS — J309 Allergic rhinitis, unspecified: Secondary | ICD-10-CM | POA: Insufficient documentation

## 2016-01-17 DIAGNOSIS — K5732 Diverticulitis of large intestine without perforation or abscess without bleeding: Secondary | ICD-10-CM | POA: Diagnosis not present

## 2016-01-17 DIAGNOSIS — I2510 Atherosclerotic heart disease of native coronary artery without angina pectoris: Secondary | ICD-10-CM | POA: Insufficient documentation

## 2016-01-17 DIAGNOSIS — R0609 Other forms of dyspnea: Secondary | ICD-10-CM

## 2016-01-17 DIAGNOSIS — R0600 Dyspnea, unspecified: Secondary | ICD-10-CM

## 2016-01-17 DIAGNOSIS — F40240 Claustrophobia: Secondary | ICD-10-CM | POA: Insufficient documentation

## 2016-01-17 DIAGNOSIS — I350 Nonrheumatic aortic (valve) stenosis: Secondary | ICD-10-CM

## 2016-01-17 DIAGNOSIS — I6529 Occlusion and stenosis of unspecified carotid artery: Secondary | ICD-10-CM | POA: Insufficient documentation

## 2016-01-17 DIAGNOSIS — G4733 Obstructive sleep apnea (adult) (pediatric): Secondary | ICD-10-CM | POA: Diagnosis not present

## 2016-01-17 DIAGNOSIS — H3530 Unspecified macular degeneration: Secondary | ICD-10-CM | POA: Diagnosis not present

## 2016-01-17 DIAGNOSIS — G8929 Other chronic pain: Secondary | ICD-10-CM | POA: Insufficient documentation

## 2016-01-17 DIAGNOSIS — Z8249 Family history of ischemic heart disease and other diseases of the circulatory system: Secondary | ICD-10-CM | POA: Diagnosis not present

## 2016-01-17 DIAGNOSIS — K219 Gastro-esophageal reflux disease without esophagitis: Secondary | ICD-10-CM | POA: Insufficient documentation

## 2016-01-17 DIAGNOSIS — R5383 Other fatigue: Secondary | ICD-10-CM

## 2016-01-17 DIAGNOSIS — I739 Peripheral vascular disease, unspecified: Secondary | ICD-10-CM | POA: Diagnosis not present

## 2016-01-17 DIAGNOSIS — I10 Essential (primary) hypertension: Secondary | ICD-10-CM | POA: Diagnosis not present

## 2016-01-17 DIAGNOSIS — M1990 Unspecified osteoarthritis, unspecified site: Secondary | ICD-10-CM | POA: Diagnosis not present

## 2016-01-17 DIAGNOSIS — I471 Supraventricular tachycardia: Secondary | ICD-10-CM | POA: Diagnosis not present

## 2016-01-17 DIAGNOSIS — R0602 Shortness of breath: Secondary | ICD-10-CM | POA: Diagnosis present

## 2016-01-17 DIAGNOSIS — I25118 Atherosclerotic heart disease of native coronary artery with other forms of angina pectoris: Secondary | ICD-10-CM

## 2016-01-17 DIAGNOSIS — Z01812 Encounter for preprocedural laboratory examination: Secondary | ICD-10-CM

## 2016-01-17 HISTORY — PX: CARDIAC CATHETERIZATION: SHX172

## 2016-01-17 LAB — POCT I-STAT 3, ART BLOOD GAS (G3+)
ACID-BASE DEFICIT: 4 mmol/L — AB (ref 0.0–2.0)
BICARBONATE: 20.7 meq/L (ref 20.0–24.0)
O2 Saturation: 95 %
PO2 ART: 77 mmHg — AB (ref 80.0–100.0)
TCO2: 22 mmol/L (ref 0–100)
pCO2 arterial: 37.7 mmHg (ref 35.0–45.0)
pH, Arterial: 7.348 — ABNORMAL LOW (ref 7.350–7.450)

## 2016-01-17 LAB — POCT I-STAT 3, VENOUS BLOOD GAS (G3P V)
Acid-base deficit: 2 mmol/L (ref 0.0–2.0)
Acid-base deficit: 2 mmol/L (ref 0.0–2.0)
BICARBONATE: 21.8 meq/L (ref 20.0–24.0)
BICARBONATE: 24.1 meq/L — AB (ref 20.0–24.0)
O2 SAT: 90 %
O2 Saturation: 71 %
PCO2 VEN: 34.5 mmHg — AB (ref 45.0–50.0)
PH VEN: 7.358 — AB (ref 7.250–7.300)
PH VEN: 7.408 — AB (ref 7.250–7.300)
PO2 VEN: 58 mmHg — AB (ref 31.0–45.0)
TCO2: 23 mmol/L (ref 0–100)
TCO2: 25 mmol/L (ref 0–100)
pCO2, Ven: 42.9 mmHg — ABNORMAL LOW (ref 45.0–50.0)
pO2, Ven: 39 mmHg (ref 31.0–45.0)

## 2016-01-17 SURGERY — RIGHT/LEFT HEART CATH AND CORONARY ANGIOGRAPHY

## 2016-01-17 MED ORDER — SODIUM CHLORIDE 0.9 % WEIGHT BASED INFUSION
3.0000 mL/kg/h | INTRAVENOUS | Status: AC
  Administered 2016-01-17: 3 mL/kg/h via INTRAVENOUS

## 2016-01-17 MED ORDER — VERAPAMIL HCL 2.5 MG/ML IV SOLN
INTRAVENOUS | Status: AC
  Filled 2016-01-17: qty 2

## 2016-01-17 MED ORDER — SODIUM CHLORIDE 0.9% FLUSH: 3.0000 mL | Freq: Two times a day (BID) | INTRAVENOUS | Status: DC

## 2016-01-17 MED ORDER — HEPARIN SODIUM (PORCINE) 1000 UNIT/ML IJ SOLN
INTRAMUSCULAR | Status: AC
  Filled 2016-01-17: qty 1

## 2016-01-17 MED ORDER — SODIUM CHLORIDE 0.9 % WEIGHT BASED INFUSION: 1.0000 mL/kg/h | INTRAVENOUS | Status: AC

## 2016-01-17 MED ORDER — HEPARIN SODIUM (PORCINE) 1000 UNIT/ML IJ SOLN
INTRAMUSCULAR | Status: DC | PRN
  Administered 2016-01-17: 3000 [IU] via INTRAVENOUS

## 2016-01-17 MED ORDER — OXYCODONE-ACETAMINOPHEN 5-325 MG PO TABS: 1.0000 | ORAL_TABLET | ORAL | Status: DC | PRN

## 2016-01-17 MED ORDER — SODIUM CHLORIDE 0.9% FLUSH: 3.0000 mL | INTRAVENOUS | Status: DC | PRN

## 2016-01-17 MED ORDER — LIDOCAINE HCL (PF) 1 % IJ SOLN
INTRAMUSCULAR | Status: AC
  Filled 2016-01-17: qty 30

## 2016-01-17 MED ORDER — IOPAMIDOL (ISOVUE-370) INJECTION 76%
INTRAVENOUS | Status: AC
  Filled 2016-01-17: qty 50

## 2016-01-17 MED ORDER — LIDOCAINE HCL (PF) 1 % IJ SOLN
INTRAMUSCULAR | Status: DC | PRN
  Administered 2016-01-17 (×2): 2 mL via SUBCUTANEOUS

## 2016-01-17 MED ORDER — MIDAZOLAM HCL 2 MG/2ML IJ SOLN
INTRAMUSCULAR | Status: AC
  Filled 2016-01-17: qty 2

## 2016-01-17 MED ORDER — ACETAMINOPHEN 325 MG PO TABS: 650.0000 mg | ORAL_TABLET | ORAL | Status: DC | PRN

## 2016-01-17 MED ORDER — FENTANYL CITRATE (PF) 100 MCG/2ML IJ SOLN
INTRAMUSCULAR | Status: DC | PRN
  Administered 2016-01-17: 50 ug via INTRAVENOUS

## 2016-01-17 MED ORDER — SODIUM CHLORIDE 0.9 % IV SOLN: 250.0000 mL | INTRAVENOUS | Status: DC | PRN

## 2016-01-17 MED ORDER — IOPAMIDOL (ISOVUE-370) INJECTION 76%
INTRAVENOUS | Status: DC | PRN
  Administered 2016-01-17: 100 mL via INTRA_ARTERIAL

## 2016-01-17 MED ORDER — HEPARIN (PORCINE) IN NACL 2-0.9 UNIT/ML-% IJ SOLN
INTRAMUSCULAR | Status: DC | PRN
  Administered 2016-01-17: 1000 mL

## 2016-01-17 MED ORDER — SODIUM CHLORIDE 0.9 % WEIGHT BASED INFUSION: 1.0000 mL/kg/h | INTRAVENOUS | Status: DC

## 2016-01-17 MED ORDER — ASPIRIN 81 MG PO CHEW: 81.0000 mg | CHEWABLE_TABLET | Freq: Every day | ORAL | Status: DC

## 2016-01-17 MED ORDER — MIDAZOLAM HCL 2 MG/2ML IJ SOLN
INTRAMUSCULAR | Status: DC | PRN
  Administered 2016-01-17: 1 mg via INTRAVENOUS

## 2016-01-17 MED ORDER — ONDANSETRON HCL 4 MG/2ML IJ SOLN: 4.0000 mg | Freq: Four times a day (QID) | INTRAMUSCULAR | Status: DC | PRN

## 2016-01-17 MED ORDER — IOPAMIDOL (ISOVUE-370) INJECTION 76%
INTRAVENOUS | Status: AC
  Filled 2016-01-17: qty 100

## 2016-01-17 MED ORDER — HEPARIN (PORCINE) IN NACL 2-0.9 UNIT/ML-% IJ SOLN
INTRAMUSCULAR | Status: AC
  Filled 2016-01-17: qty 1000

## 2016-01-17 MED ORDER — ASPIRIN 81 MG PO CHEW: 81.0000 mg | CHEWABLE_TABLET | ORAL | Status: DC

## 2016-01-17 MED ORDER — FENTANYL CITRATE (PF) 100 MCG/2ML IJ SOLN
INTRAMUSCULAR | Status: AC
  Filled 2016-01-17: qty 2

## 2016-01-17 SURGICAL SUPPLY — 14 items
CATH BALLN WEDGE 5F 110CM (CATHETERS) ×2 IMPLANT
CATH INFINITI 5 FR JL3.5 (CATHETERS) ×2 IMPLANT
CATH INFINITI JR4 5F (CATHETERS) ×2 IMPLANT
DEVICE RAD COMP TR BAND LRG (VASCULAR PRODUCTS) ×2 IMPLANT
GLIDESHEATH SLEND A-KIT 6F 22G (SHEATH) ×2 IMPLANT
GUIDEWIRE .025 260CM (WIRE) ×2 IMPLANT
KIT HEART LEFT (KITS) ×2 IMPLANT
PACK CARDIAC CATHETERIZATION (CUSTOM PROCEDURE TRAY) ×2 IMPLANT
SHEATH FAST CATH BRACH 5F 5CM (SHEATH) ×2 IMPLANT
TRANSDUCER W/STOPCOCK (MISCELLANEOUS) ×2 IMPLANT
TUBING CIL FLEX 10 FLL-RA (TUBING) ×2 IMPLANT
WIRE EMERALD ST .035X150CM (WIRE) ×2 IMPLANT
WIRE HI TORQ VERSACORE-J 145CM (WIRE) ×2 IMPLANT
WIRE SAFE-T 1.5MM-J .035X260CM (WIRE) ×2 IMPLANT

## 2016-01-17 NOTE — H&P (View-Only) (Signed)
Patient ID: Ryan [name], male   DOB: July 30, 1929, 82 y.o.   MRN: [id number]     Cardiology Office Note    Date:  01/14/2016   ID:  [name], DOB [birth year], MRN [id number]  PCP:  Velna Hatchet, MD  Cardiologist:  Ena Dawley, MD   Chief complain: Dyspnea on exertion.  History of Present Illness:  Ryan [name] is a 82 y.o. male who is a very pleasant patient who despite his age remains very active. He is a former runner who is now struggling with multiple comorbidities and arthritis but he continues to walk with a walker 2 miles a day. Mr. Gumble has a history of peripheral arterial disease with carotid endarterectomy bilaterally for severe stenosis in 1997, hypertension, hyperlipidemia, history of SVT, history of aortic stenosis mild to moderate on echocardiogram in 2015, obstructive sleep apnea that intolerance to Cipro admission as the patient is claustrophobic, patient also has known nonobstructive coronary artery disease with 2 cardiac caths in the past last done in 2013 that showed diffusely calcified vessel with mild left main disease, 40% LAD disease, 60% diffuse circumflex disease and 60% diffuse RCA disease, his LVEF was preserved estimated at 65%. Lexiscan stress test in October 2015 was negative for prior infarction or ischemia and showed persistent normal LVEF. Patient also has history of mildly to moderately elevated pulmonary pressures. He was intolerance to carvedilol, metoprolol, and losartan in the past with a drop in his blood pressure down to 90s. The patient states that on 2 occasions he went to the hospital when he developed chest pain associated pain in his neck that responded to PPIs and was believed to be related to his gastric reflux. He currently continues to be very active but is very limited [name] than what he is usually used to NSAIDs that it's progressively getting worse to the point when he has to make her frequent stops. He is  currently not on any statin, his last cholesterol measurement was in 2012 with LDL 73 triglycerides 131 and HDL 65. He denies any orthopnea, paroxysmal nocturnal dyspnea no lower extremity edema.  01/14/2016, the patient is being seen earlier than plain, I was notified by his son-in-law who is a physician at [facility] for concern of worsening fatigue and progressively worsening dyspnea on exertion. The patient has been active his whole life and lately he is unable to finish his walks and swims and he just overall feels very tired. He states that he will be okay to except that this is secondary to his age however with his prior medical history of nonobstructive CAD he wants to make sure this is not coming from his heart. He has been compliant to his meds. He denies any palpitations or syncope no lower extremity edema orthopnea or proximal nocturnal dyspnea.   Past Medical History:  Diagnosis Date  . Age-related macular degeneration   . Allergic rhinitis   . Aortic valve disorder   . Aortic valve stenosis   . Carotid artery obstruction   . Chronic lower back pain   . Coronary arteriosclerosis in native artery   . Diverticulitis of colon   . DJD (degenerative joint disease)   . Dysthymia   . History of elevated PSA 05/2009  . HLD (hyperlipidemia)   . Hypertension   . Nocturnal hypoxemia   . OSA (obstructive sleep apnea)    intolerant to CPAP  . Paroxysmal supraventricular tachycardia ([place])   . Primary malignant neoplasm of bladder ([place])   .  Prostate cancer [facility])     Past Surgical History:  Procedure Laterality Date  . ABDOMINAL SURGERY  1981   Meckles Diverticulum with volvulus  . APPENDECTOMY    . CAROTID ENDARTERECTOMY     bilateral  . HERNIA REPAIR Right 2009    Current Medications: Outpatient Medications Prior to Visit  Medication Sig Dispense Refill  . amLODipine (NORVASC) 10 MG tablet Take 10 mg by mouth daily.    Marland Kitchen aspirin 81 MG tablet Take 81 mg by mouth daily.    .  cholecalciferol (VITAMIN D) 1000 units tablet Take 1,000 Units by mouth daily.    . Coenzyme Q10 (COQ-10 PO) Take 1 capsule by mouth daily.    Marland Kitchen latanoprost (XALATAN) 0.005 % ophthalmic solution Place 1 drop into both eyes at bedtime.    Marland Kitchen lisinopril (PRINIVIL,ZESTRIL) 20 MG tablet Take 20 mg by mouth daily.    Marland Kitchen omeprazole (PRILOSEC) 20 MG capsule Take 20 mg by mouth daily.    . Probiotic Product (PROBIOTIC PO) Take 1 tablet by mouth daily.    . Dorzolamide HCl-Timolol Mal PF 22.3-6.8 MG/ML SOLN Place 1 drop into both eyes at bedtime.    . triamcinolone cream (KENALOG) 0.1 % Apply on to skin twice daily  6   No facility-administered medications prior to visit.      Allergies:   Hydrochlorothiazide; Ace inhibitors; Ciprofloxacin; Losartan; and Sulfa antibiotics   Social History   Social History  . Marital status: Married    Spouse name: N/A  . Number of children: N/A  . Years of education: N/A   Social History Main Topics  . Smoking status: Never Smoker  . Smokeless tobacco: Never Used  . Alcohol use No  . Drug use: No  . Sexual activity: Not Asked   Other Topics Concern  . None   Social History Narrative  . None     Family History:  The patient's family history includes Heart disease in his father and mother.   ROS:   Please see the history of present illness.    ROS All other systems reviewed and are negative.   PHYSICAL EXAM:   VS:  BP (!) 150/70   Pulse 73   Ht 5\' 7"  (1.702 m)   Wt 131 lb 3.2 oz (59.5 kg)   BMI 20.55 kg/m    GEN: Well nourished, well developed, in no acute distress  HEENT: normal  Neck: no JVD, carotid bruits, or masses Cardiac: RRR; 4/6 holosystolic murmurs, very soft S2, rubs, or gallops,no edema  Respiratory:  clear to auscultation bilaterally, normal work of breathing GI: soft, nontender, nondistended, + BS MS: no deformity or atrophy  Skin: warm and dry, no rash Neuro:  Alert and Oriented x 3, Strength and sensation are  intact Psych: euthymic mood, full affect  Wt Readings from Last 3 Encounters:  01/14/16 131 lb 3.2 oz (59.5 kg)  09/26/15 128 lb (58.1 kg)    Studies/Labs Reviewed:   EKG:  EKG is ordered today.  The ekg ordered today demonstrates Sinus rhythm with ventricular related 66 bpm, right axis deviation, left bundle branch block, there is no prior EKG available for comparison.  Recent Labs: No results found for requested labs within last 8760 hours.   Lipid Panel as described in history of present illness  Additional studies/ records that were reviewed today include:  I have reviewed his prior medical records including office visits, cardiac cath, stress test, and echocardiogram. All described in history of present  illness.    ASSESSMENT:    1. Pre-procedure lab exam   2. Moderate aortic stenosis   3. Abnormal EKG   4. Coronary artery disease involving native coronary artery of native heart without angina pectoris   5. Essential hypertension   6. DOE (dyspnea on exertion)   7. LBBB (left bundle branch block)    4. Peripheral arterial disease 5. Hyperlipidemia  PLAN:  In order of problems listed above:  1. Coronary artery disease - known nonobstructive, he is on medication with aspirin lisinopril, no beta blockers at as he is bradycardic at baseline and didn't tolerate in the past. He is not on statin most probably secondary to memory impairment in the past. His EKG shows left bundle branch block that was Present in April 2017 as well, but not prior to that.. We have recently checked his aortic stenosis that it's still in moderate range, and since his symptoms of functional capacity and dyspnea on exertion are progressively worsening we will schedule left cardiac catheterization his records indicate that on a cath in 2013 he had 60% lesion in post RCA and left circumflex artery and 40% lesion in LAD.  2. Aortic stenosis -moderate on the echocardiogram in April 2017 with mean gradient  of 25 mmHg.  3. Hypertension - most probably white coat syndrome as he states it's always in 130s at home and history of hypotension in the past for now I'll just follow current medical regimen  4. Hyperlipidemia - acceptable lipids in 2012, not on statins as he had memory impairment in the past.  5. Carotid disease status post endarterectomy in 1997, he currently has no bruits and no significant dizziness.  6. Left bundle branch block those previous not described on prior EKGs will consider ischemic workup in the future if other causes for his symptoms.  Medication Adjustments/Labs and Tests Ordered: Current medicines are reviewed at length with the patient today.  Concerns regarding medicines are outlined above.  Medication changes, Labs and Tests ordered today are listed in the Patient Instructions below. Patient Instructions  Medication Instructions:   Your physician recommends that you continue on your current medications as directed. Please refer to the Current Medication list given to you today.   Labwork:  TODAY--PRE-CATH LABS-CMET, CBC W DIFF, AND PT/INR   Testing/Procedures:  Your physician has requested that you have a cardiac catheterization. Cardiac catheterization is used to diagnose and/or treat various heart conditions. Doctors may recommend this procedure for a number of different reasons. The most common reason is to evaluate chest pain. Chest pain can be a symptom of coronary artery disease (CAD), and cardiac catheterization can show whether plaque is narrowing or blocking your heart's arteries. This procedure is also used to evaluate the valves, as well as measure the blood flow and oxygen levels in different parts of your heart. For further information please visit [facility]uge[facility]iest[facility][email or web address]. Please follow instruction sheet, as given.  YOUR CATH IS SCHEDULED FOR THIS Thursday 01/17/16 AT 7:30 AM WITH DR Tamala Julian.  YOU MUST ARRIVE AT [place] AT 5:30 AM ON THE  MORNING OF YOUR CATH.    Follow-Up:  AS PLANNED IN February 20, 2016 AT 8:15 AM WITH DR Meda Coffee      If you need a refill on your cardiac medications before your next appointment, please call your pharmacy.      Signed, Ena Dawley, MD  01/14/2016 1:32 PM    [place], [place]  [address] Phone: [phone]; Fax: [phone]

## 2016-01-17 NOTE — Discharge Instructions (Signed)
Radial Site Care °Refer to this sheet in the next few weeks. These instructions provide you with information about caring for yourself after your procedure. Your health care provider may also give you more specific instructions. Your treatment has been planned according to current medical practices, but problems sometimes occur. Call your health care provider if you have any problems or questions after your procedure. °WHAT TO EXPECT AFTER THE PROCEDURE °After your procedure, it is typical to have the following: °· Bruising at the radial site that usually fades within 1-2 weeks. °· Blood collecting in the tissue (hematoma) that may be painful to the touch. It should usually decrease in size and tenderness within 1-2 weeks. °HOME CARE INSTRUCTIONS °· Take medicines only as directed by your health care provider. °· You may shower 24-48 hours after the procedure or as directed by your health care provider. Remove the bandage (dressing) and gently wash the site with plain soap and water. Pat the area dry with a clean towel. Do not rub the site, because this may cause bleeding. °· Do not take baths, swim, or use a hot tub until your health care provider approves. °· Check your insertion site every day for redness, swelling, or drainage. °· Do not apply powder or lotion to the site. °· Do not flex or bend the affected arm for 24 hours or as directed by your health care provider. °· Do not push or pull heavy objects with the affected arm for 24 hours or as directed by your health care provider. °· Do not lift over 10 lb (4.5 kg) for 5 days after your procedure or as directed by your health care provider. °· Ask your health care provider when it is okay to: °¨ Return to work or school. °¨ Resume usual physical activities or sports. °¨ Resume sexual activity. °· Do not drive home if you are discharged the same day as the procedure. Have someone else drive you. °· You may drive 24 hours after the procedure unless otherwise  instructed by your health care provider. °· Do not operate machinery or power tools for 24 hours after the procedure. °· If your procedure was done as an outpatient procedure, which means that you went home the same day as your procedure, a responsible adult should be with you for the first 24 hours after you arrive home. °· Keep all follow-up visits as directed by your health care provider. This is important. °SEEK MEDICAL CARE IF: °· You have a fever. °· You have chills. °· You have increased bleeding from the radial site. Hold pressure on the site. CALL 911 °SEEK IMMEDIATE MEDICAL CARE IF: °· You have unusual pain at the radial site. °· You have redness, warmth, or swelling at the radial site. °· You have drainage (other than a small amount of blood on the dressing) from the radial site. °· The radial site is bleeding, and the bleeding does not stop after 30 minutes of holding steady pressure on the site. °· Your arm or hand becomes pale, cool, tingly, or numb. °  °This information is not intended to replace advice given to you by your health care provider. Make sure you discuss any questions you have with your health care provider. °  °Document Released: 06/21/2010 Document Revised: 06/09/2014 Document Reviewed: 12/05/2013 °Elsevier Interactive Patient Education ©2016 [employer]vier Inc. ° °

## 2016-01-17 NOTE — Research (Signed)
LEADERS FREEII Informed Consent   Subject Name: Ryan [name]  Subject met inclusion and exclusion criteria.  The informed consent form, study requirements and expectations were reviewed with the subject and questions and concerns were addressed prior to the signing of the consent form.  The subject verbalized understanding of the trail requirements.  The subject agreed to participate in the LEADERS FREE II trial and signed the informed consent.  The informed consent was obtained prior to performance of any protocol-specific procedures for the subject.  A copy of the signed informed consent was given to the subject and a copy was placed in the subject's medical record. Applicable only if BIOFREEDOM STENT Implanted.  Hedrick,Tammy W 01/17/2016, 2423

## 2016-01-17 NOTE — Progress Notes (Signed)
Site area: rt ac venous sheath Site Prior to Removal:  Level 0 Pressure Applied For:  10 minutes Manual:   yes Patient Status During Pull:  stable Post Pull Site:  Level  0 Post Pull Instructions Given:  yes Post Pull Pulses Present: yes Dressing Applied:  Small tegaderm Bedrest begins @  0930 Comments:

## 2016-01-17 NOTE — Interval H&P Note (Signed)
Cath Lab Visit (complete for each Cath Lab visit)  Clinical Evaluation Leading to the Procedure:   ACS: No.  Non-ACS:    Anginal Classification: CCS [name]  Anti-ischemic medical therapy: Maximal Therapy (2 or more classes of medications)  Non-Invasive Test Results: No non-invasive testing performed  Prior CABG: No previous CABG      History and Physical Interval Note:  01/17/2016 7:43 AM  Ryan [name]  has presented today for surgery, with the diagnosis of ekg changes, moderaate CAD  The various methods of treatment have been discussed with the patient and family. After consideration of risks, benefits and other options for treatment, the patient has consented to  Procedure(s): Right/Left Heart Cath and Coronary Angiography (N/A) as a surgical intervention .  The patient's history has been reviewed, patient examined, no change in status, stable for surgery.  I have reviewed the patient's chart and labs.  Questions were answered to the patient's satisfaction.     Ryan [name]

## 2016-01-18 MED FILL — Iopamidol IV Soln 76%: INTRAVENOUS | Qty: 100 | Status: AC

## 2016-02-06 ENCOUNTER — Ambulatory Visit: Payer: Medicare Other | Admitting: Cardiology

## 2016-02-20 ENCOUNTER — Ambulatory Visit: Payer: Medicare Other | Admitting: Cardiology

## 2016-02-20 ENCOUNTER — Encounter: Payer: Self-pay | Admitting: Cardiology

## 2016-02-20 ENCOUNTER — Ambulatory Visit (INDEPENDENT_AMBULATORY_CARE_PROVIDER_SITE_OTHER): Payer: Medicare Other | Admitting: Cardiology

## 2016-02-20 VITALS — BP 126/62 | HR 80 | Ht 67.0 in | Wt 131.0 lb

## 2016-02-20 DIAGNOSIS — I10 Essential (primary) hypertension: Secondary | ICD-10-CM

## 2016-02-20 DIAGNOSIS — I2510 Atherosclerotic heart disease of native coronary artery without angina pectoris: Secondary | ICD-10-CM | POA: Diagnosis not present

## 2016-02-20 DIAGNOSIS — I447 Left bundle-branch block, unspecified: Secondary | ICD-10-CM | POA: Diagnosis not present

## 2016-02-20 DIAGNOSIS — R0600 Dyspnea, unspecified: Secondary | ICD-10-CM

## 2016-02-20 DIAGNOSIS — R0609 Other forms of dyspnea: Secondary | ICD-10-CM

## 2016-02-20 DIAGNOSIS — I350 Nonrheumatic aortic (valve) stenosis: Secondary | ICD-10-CM | POA: Diagnosis not present

## 2016-02-20 DIAGNOSIS — E785 Hyperlipidemia, unspecified: Secondary | ICD-10-CM

## 2016-02-20 NOTE — Progress Notes (Signed)
Patient ID: Ryan [name], male   DOB: June 14, 1929, 82 y.o.   MRN: [id number]     Cardiology Office Note    Date:  02/23/2016   ID:  Ryan [name], DOB 05-02-1930, MRN [id number]  PCP:  Velna Hatchet, MD  Cardiologist:  Ena Dawley, MD   Chief complain: Dyspnea on exertion.  History of Present Illness:  Ryan [name] is a 82 y.o. male who is a very pleasant patient who despite his age remains very active. He is a former runner who is now struggling with multiple comorbidities and arthritis but he continues to walk with a walker 2 miles a day. Mr. Landers has a history of peripheral arterial disease with carotid endarterectomy bilaterally for severe stenosis in 1997, hypertension, hyperlipidemia, history of SVT, history of aortic stenosis mild to moderate on echocardiogram in 2015, obstructive sleep apnea that intolerance to Cipro admission as the patient is claustrophobic, patient also has known nonobstructive coronary artery disease with 2 cardiac caths in the past last done in 2013 that showed diffusely calcified vessel with mild left main disease, 40% LAD disease, 60% diffuse circumflex disease and 60% diffuse RCA disease, his LVEF was preserved estimated at 65%. Lexiscan stress test in October 2015 was negative for prior infarction or ischemia and showed persistent normal LVEF. Patient also has history of mildly to moderately elevated pulmonary pressures. He was intolerance to carvedilol, metoprolol, and losartan in the past with a drop in his blood pressure down to 90s. The patient states that on 2 occasions he went to the hospital when he developed chest pain associated pain in his neck that responded to PPIs and was believed to be related to his gastric reflux. He currently continues to be very active but is very limited [name] than what he is usually used to NSAIDs that it's progressively getting worse to the point when he has to make her frequent stops. He is currently not on any  statin, his last cholesterol measurement was in 2012 with LDL 73 triglycerides 131 and HDL 65. He denies any orthopnea, paroxysmal nocturnal dyspnea no lower extremity edema.  01/14/2016, the patient is being seen earlier than plain, I was notified by his son-in-law who is a physician at [facility] for concern of worsening fatigue and progressively worsening dyspnea on exertion. The patient has been active his whole life and lately he is unable to finish his walks and swims and he just overall feels very tired. He states that he will be okay to except that this is secondary to his age however with his prior medical history of nonobstructive CAD he wants to make sure this is not coming from his heart. He has been compliant to his meds. He denies any palpitations or syncope no lower extremity edema orthopnea or proximal nocturnal dyspnea.  02/22/2016 - the patient underwent cardiac catheterization on 01/17/2016 that showed severe two-vessel disease in RCA, OM and left circumflex artery and medical management was recommended. The patient states that he is lifestyle has been miserable for at least 6 months now, he was used to walk and swim everyday now he is hardly able to do activities of daily living. He was disappointed that he didn't get any intervention done. He reports shortness of breath with minimal exertion. No palpitations or syncope. Been compliant with his meds.   Past Medical History:  Diagnosis Date  . Age-related macular degeneration   . Allergic rhinitis   . Aortic valve disorder   . Aortic valve stenosis   .  Carotid artery obstruction   . Chronic lower back pain   . Coronary arteriosclerosis in native artery   . Diverticulitis of colon   . DJD (degenerative joint disease)   . Dysthymia   . History of elevated PSA 05/2009  . HLD (hyperlipidemia)   . Hypertension   . Nocturnal hypoxemia   . OSA (obstructive sleep apnea)    intolerant to CPAP  . Paroxysmal supraventricular tachycardia  ([place])   . Primary malignant neoplasm of bladder ([place])   . Prostate cancer [facility])     Past Surgical History:  Procedure Laterality Date  . ABDOMINAL SURGERY  1981   Meckles Diverticulum with volvulus  . APPENDECTOMY    . CARDIAC CATHETERIZATION N/A 01/17/2016   Procedure: Right/Left Heart Cath and Coronary Angiography;  Surgeon: Belva Crome, MD;  Location: [place] [facility];  Service: Cardiovascular;  Laterality: N/A;  . CAROTID ENDARTERECTOMY     bilateral  . HERNIA REPAIR Right 2009    Current Medications: Outpatient Medications Prior to Visit  Medication Sig Dispense Refill  . amLODipine (NORVASC) 10 MG tablet Take 10 mg by mouth daily.    Marland Kitchen aspirin 81 MG tablet Take 81 mg by mouth daily.    . cholecalciferol (VITAMIN D) 1000 units tablet Take 1,000 Units by mouth daily.    . Coenzyme Q10 (COQ-10 PO) Take 1 capsule by mouth daily.    Marland Kitchen latanoprost (XALATAN) 0.005 % ophthalmic solution Place 1 drop into both eyes at bedtime.    Marland Kitchen lisinopril (PRINIVIL,ZESTRIL) 20 MG tablet Take 20 mg by mouth daily.    Marland Kitchen omeprazole (PRILOSEC) 20 MG capsule Take 20 mg by mouth daily.    . Probiotic Product (PROBIOTIC PO) Take 1 tablet by mouth daily.     No facility-administered medications prior to visit.      Allergies:   Hydrochlorothiazide; Ace inhibitors; Ciprofloxacin; Losartan; and Sulfa antibiotics   Social History   Social History  . Marital status: Married    Spouse name: N/A  . Number of children: N/A  . Years of education: N/A   Social History Main Topics  . Smoking status: Never Smoker  . Smokeless tobacco: Never Used  . Alcohol use No  . Drug use: No  . Sexual activity: Not Asked   Other Topics Concern  . None   Social History Narrative  . None     Family History:  The patient's family history includes Heart disease in his father and mother.   ROS:   Please see the history of present illness.    ROS All other systems reviewed and are negative.   PHYSICAL  EXAM:   VS:  BP 126/62   Pulse 80   Ht 5\' 7"  (1.702 m)   Wt 131 lb (59.4 kg)   BMI 20.52 kg/m    GEN: Well nourished, well developed, in no acute distress  HEENT: normal  Neck: no JVD, carotid bruits, or masses Cardiac: RRR; 4/6 holosystolic murmurs, very soft S2, rubs, or gallops,no edema  Respiratory:  clear to auscultation bilaterally, normal work of breathing GI: soft, nontender, nondistended, + BS MS: no deformity or atrophy  Skin: warm and dry, no rash Neuro:  Alert and Oriented x 3, Strength and sensation are intact Psych: euthymic mood, full affect  Wt Readings from Last 3 Encounters:  02/20/16 131 lb (59.4 kg)  01/17/16 130 lb (59 kg)  01/14/16 131 lb 3.2 oz (59.5 kg)    Studies/Labs Reviewed:   EKG:  EKG is ordered today.  The ekg ordered today demonstrates Sinus rhythm with ventricular related 66 bpm, right axis deviation, left bundle branch block, there is no prior EKG available for comparison.  Recent Labs: 01/14/2016: ALT 17; BUN 21; Creat 0.91; Hemoglobin 15.7; Platelets 252; Potassium 4.2; Sodium 142   Lipid Panel as described in history of present illness  Additional studies/ records that were reviewed today include:  I have reviewed his prior medical records including office visits, cardiac cath, stress test, and echocardiogram. All described in history of present illness.  TTE: 10/10/2015 - Left ventricle: The cavity size was normal. Wall thickness was   increased in a pattern of mild LVH. Systolic function was normal.   The estimated ejection fraction was in the range of 60% to 65%.   Wall motion was normal; there were no regional wall motion   abnormalities. Doppler parameters are consistent with abnormal   left ventricular relaxation (grade 1 diastolic dysfunction).   Doppler parameters are consistent with indeterminate ventricular   filling pressure. - Aortic valve: Valve mobility was restricted. There was moderate   stenosis. Peak velocity (S):  387.9 cm/s. Mean gradient (S): 25 mm   Hg. Valve area (VTI): 1.04 cm^2. Valve area (Vmax): 1 cm^2. Valve   area (Vmean): 1.22 cm^2. - Mitral valve: There was trivial regurgitation. - Left atrium: The atrium was mildly dilated. - Right ventricle: The cavity size was normal. Wall thickness was   normal. Systolic function was normal. - Atrial septum: No defect or patent foramen ovale was identified   by color flow Doppler. - Tricuspid valve: There was mild regurgitation. - Inferior vena cava: The vessel was normal in size. The   respirophasic diameter changes were in the normal range (= 50%),   consistent with normal central venous pressure.  Left cardiac catheterization on 01/17/2016  Conclusion     Prox RCA to Mid RCA lesion, 20 %stenosed.  Mid RCA lesion, 80 %stenosed.  Ost 1st Mrg to 1st Mrg lesion, 80 %stenosed.  Mid Cx to Dist Cx lesion, 70 %stenosed.  Prox LAD to Dist LAD lesion, 40 %stenosed.  The left ventricular systolic function is normal.  LV end diastolic pressure is normal.  The left ventricular ejection fraction is 55-65% by visual estimate.  LV end diastolic pressure is normal.    There is three-vessel coronary calcification.  80% eccentric mid RCA stenosis.  80% ostial obtuse marginal #1 stenosis.  Moderate diffuse calcified LAD disease less than 50%.  Normal left ventricular systolic function and hemodynamics  Normal pulmonary pressures and pulmonary care 0.1.  Moderate to severe aortic stenosis with aortic valve area 1.04.   RECOMMENDATIONS:   Anti-ischemic therapy to potentially include beta blocker therapy, antiplatelet therapy, and statin therapy.  Intervention on both the right coronary and obtuse marginal stenosis would be higher than usual risk due to heavy calcification and vessel tortuosity. Initial inclination is medical therapy in the absence of clear anginal symptoms.  Fatigue, you've an arrest is uncertain etiology. Exclude  other possibilities including depression, although on one systemic illnesses.       ASSESSMENT:    1. Hyperlipidemia   2. Coronary artery disease involving native coronary artery of native heart without angina pectoris   3. Aortic stenosis   4. LBBB (left bundle branch block)   5. DOE (dyspnea on exertion)   6. Moderate aortic stenosis   7. Essential hypertension    4. Peripheral arterial disease 5. Hyperlipidemia    PLAN:  In order of problems listed above:  1. Coronary artery disease - Severe mid RCA disease, OM1 disease and left circumflex disease, per Reports these lesions might be amenable for high-risk intervention if medical therapy fails. The patient is significantly symptomatic, we will show these pictures to Dr. Martinique. We will also consider referral for CT surgery.  2. Aortic stenosis -moderate on the echocardiogram in April 2017 with mean gradient of 25 mmHg.  3. Hypertension - controlled.  4. Hyperlipidemia - acceptable lipids in 2012, not on statins as he had memory impairment in the past.  5. Carotid disease status post endarterectomy in 1997, he currently has no bruits and no significant dizziness.  6. Left bundle branch block   Medication Adjustments/Labs and Tests Ordered: Current medicines are reviewed at length with the patient today.  Concerns regarding medicines are outlined above.  Medication changes, Labs and Tests ordered today are listed in the Patient Instructions below. Patient Instructions  Medication Instructions:   Your physician recommends that you continue on your current medications as directed. Please refer to the Current Medication list given to you today.    Follow-Up:  3 MONTHS WITH DR Meda Coffee     If you need a refill on your cardiac medications before your next appointment, please call your pharmacy.      Signed, Ena Dawley, MD  02/23/2016 9:14 AM    [address],  [place], [place]  [address] Phone: [phone]; Fax: [phone]

## 2016-02-20 NOTE — Patient Instructions (Signed)
Medication Instructions:   Your physician recommends that you continue on your current medications as directed. Please refer to the Current Medication list given to you today.     Follow-Up:  3 MONTHS WITH DR NELSON       If you need a refill on your cardiac medications before your next appointment, please call your pharmacy.   

## 2016-02-25 ENCOUNTER — Telehealth: Payer: Self-pay | Admitting: *Deleted

## 2016-02-25 DIAGNOSIS — R0609 Other forms of dyspnea: Secondary | ICD-10-CM

## 2016-02-25 DIAGNOSIS — I350 Nonrheumatic aortic (valve) stenosis: Secondary | ICD-10-CM

## 2016-02-25 DIAGNOSIS — I2510 Atherosclerotic heart disease of native coronary artery without angina pectoris: Secondary | ICD-10-CM

## 2016-02-25 DIAGNOSIS — R0600 Dyspnea, unspecified: Secondary | ICD-10-CM

## 2016-02-25 NOTE — Telephone Encounter (Signed)
Message  Received: Today  Message Contents  Ryan [name] is aware of the referral and will call patient to schedule

## 2016-02-25 NOTE — Telephone Encounter (Signed)
-----   Message from Dorothy Spark, MD sent at 02/24/2016  6:40 PM EDT ----- Thank you Cub! I will arrange him an appointment, he and his family are very nice people! Genelle Gather, please arrange a CT surgery consult, thank you  ----- Message ----- From: Rexene Alberts, MD Sent: 02/24/2016   6:28 PM To: Dorothy Spark, MD  Sounds like someone who should be referred to consider surgery prior to attempting high risk PCI  ----- Message ----- From: Dorothy Spark, MD Sent: 02/23/2016   9:20 AM To: Rexene Alberts, MD  Hi Cub,  This is a patient of mine, he is an 82 year old male, his son-in-law is  one of the [facility] physicians, he used to be very active swimming and walking every day but developed symptoms of severe fatigue and dyspnea on exertion starting earlier this year. He's currently symptomatic with minimal exertion and has difficulties performing activities of daily living. He had a cardiac cath on August 17 that showed severe RCA, OM1 and left circumflex lesion. He also has moderate aortic stenosis, medical therapy was recommended and potential high risk intervention if medical therapy fails. The patient is depressed because of the whole situation as he was use to very active lifestyle and currently unable to do anything.  I am going to ask Peter Martinique if he would consider him for a high risk intervention, however in a combination with moderate aortic stenosis, would you consider him for AVR and CABG?  Thank you,  Houston Siren

## 2016-02-25 NOTE — Telephone Encounter (Signed)
Pt aware of Dr Francesca Oman conversation with Dr Martinique and Dr Roxy Manns.  Pt aware of recommendations for him to be referred to [facility], to see Dr Roxy Manns, for 3 vessel disease, aortic stenosis. Made the pt aware that both Dr Roxy Manns and Dr Meda Coffee agree that he should be referred to consider surgery prior to attempting high risk PCI.  Informed the pt that I will place the referral in the system and send our [facility] schedulers a message to arrange for the pt to be scheduled a new consult appt with Dr Roxy Manns at [facility].  Pt verbalized understanding and agrees with this plan.

## 2016-02-26 ENCOUNTER — Telehealth: Payer: Self-pay | Admitting: Cardiology

## 2016-02-26 NOTE — Telephone Encounter (Signed)
Pt has an appt scheduled with Dr Roxy Manns at [facility] for 03/24/16 at 0930.  Pt aware of appt date and time.

## 2016-02-26 NOTE — Telephone Encounter (Signed)
Pt just calling to clarify that he will be seeing Dr Roxy Manns for consult before any intervention will be done.  Reassured and informed the pt that he will most definitely be seeing Dr Roxy Manns for a new pt consult appt, and they will discuss his case and further plan at that visit.  Also informed the pt that the referral was sent yesterday to Dr Guy Sandifer office at [facility], and Jenny Reichmann is aware of this and will be calling him in the near future to schedule this appt.  Pt verbalized understanding, agrees with this plan, and very gracious for all the assistance provided.

## 2016-02-26 NOTE — Telephone Encounter (Signed)
Mr.Crislip is wanting to speak with in regards to a follow up to your conversation.. Thanks

## 2016-02-27 DIAGNOSIS — Z23 Encounter for immunization: Secondary | ICD-10-CM | POA: Diagnosis not present

## 2016-03-11 ENCOUNTER — Ambulatory Visit (INDEPENDENT_AMBULATORY_CARE_PROVIDER_SITE_OTHER): Payer: Medicare Other | Admitting: Cardiology

## 2016-03-11 ENCOUNTER — Encounter: Payer: Self-pay | Admitting: Cardiology

## 2016-03-11 VITALS — BP 130/62 | HR 76 | Ht 67.0 in | Wt 131.0 lb

## 2016-03-11 DIAGNOSIS — I739 Peripheral vascular disease, unspecified: Secondary | ICD-10-CM

## 2016-03-11 DIAGNOSIS — R0600 Dyspnea, unspecified: Secondary | ICD-10-CM

## 2016-03-11 DIAGNOSIS — I350 Nonrheumatic aortic (valve) stenosis: Secondary | ICD-10-CM

## 2016-03-11 DIAGNOSIS — R0609 Other forms of dyspnea: Secondary | ICD-10-CM

## 2016-03-11 DIAGNOSIS — I779 Disorder of arteries and arterioles, unspecified: Secondary | ICD-10-CM

## 2016-03-11 DIAGNOSIS — I447 Left bundle-branch block, unspecified: Secondary | ICD-10-CM

## 2016-03-11 DIAGNOSIS — I2510 Atherosclerotic heart disease of native coronary artery without angina pectoris: Secondary | ICD-10-CM | POA: Diagnosis not present

## 2016-03-11 DIAGNOSIS — E782 Mixed hyperlipidemia: Secondary | ICD-10-CM

## 2016-03-11 NOTE — Progress Notes (Signed)
Patient ID: Ryan [name], male   DOB: April 28, 1930, 82 y.o.   MRN: [id number]     Cardiology Office Note    Date:  03/11/2016   ID:  Ryan [name], DOB [birth year], MRN [id number]  PCP:  Velna Hatchet, MD  Cardiologist:  Ena Dawley, MD   Chief complain: Dyspnea on exertion.  History of Present Illness:  Ryan [name] is a 82 y.o. male who is a very pleasant patient who despite his age remains very active. He is a former runner who is now struggling with multiple comorbidities and arthritis but he continues to walk with a walker 2 miles a day. Ryan [name] has a history of peripheral arterial disease with carotid endarterectomy bilaterally for severe stenosis in 1997, hypertension, hyperlipidemia, history of SVT, history of aortic stenosis mild to moderate on echocardiogram in 2015, obstructive sleep apnea that intolerance to Cipro admission as the patient is claustrophobic, patient also has known nonobstructive coronary artery disease with 2 cardiac caths in the past last done in 2013 that showed diffusely calcified vessel with mild left main disease, 40% LAD disease, 60% diffuse circumflex disease and 60% diffuse RCA disease, his LVEF was preserved estimated at 65%. Lexiscan stress test in October 2015 was negative for prior infarction or ischemia and showed persistent normal LVEF. Patient also has history of mildly to moderately elevated pulmonary pressures. He was intolerance to carvedilol, metoprolol, and losartan in the past with a drop in his blood pressure down to 90s. The patient states that on 2 occasions he went to the hospital when he developed chest pain associated pain in his neck that responded to PPIs and was believed to be related to his gastric reflux. He currently continues to be very active but is very limited [name] than what he is usually used to NSAIDs that it's progressively getting worse to the point when he has to make her frequent stops. He is currently not on any  statin, his last cholesterol measurement was in 2012 with LDL 73 triglycerides 131 and HDL 65. He denies any orthopnea, paroxysmal nocturnal dyspnea no lower extremity edema.  01/14/2016, the patient is being seen earlier than plain, I was notified by his son-in-law who is a physician at [facility] for concern of worsening fatigue and progressively worsening dyspnea on exertion. The patient has been active his whole life and lately he is unable to finish his walks and swims and he just overall feels very tired. He states that he will be okay to except that this is secondary to his age however with his prior medical history of nonobstructive CAD he wants to make sure this is not coming from his heart. He has been compliant to his meds. He denies any palpitations or syncope no lower extremity edema orthopnea or proximal nocturnal dyspnea.  02/22/2016 - the patient underwent cardiac catheterization on 01/17/2016 that showed severe two-vessel disease in RCA, OM and left circumflex artery and medical management was recommended. The patient states that he is lifestyle has been miserable for at least 6 months now, he was used to walk and swim everyday now he is hardly able to do activities of daily living. He was disappointed that he didn't get any intervention done. He reports shortness of breath with minimal exertion. No palpitations or syncope. Been compliant with his meds.  03/11/2016 - the patient is coming after 2 weeks, in the meantime I have discussed his case with Dr. Tamala Julian and Dr. [place] and they both agreed that he  is coronary lesions would be of high risk and also he has severe aortic stenosis. I have referred the patient to Dr. Roxy Manns but he wanted to discuss this before he goes for an appointment. States that he continues to have symptoms of retrosternal chest tightness, shortness of breath and neck pain after few steps and he is unable to even finish activities of daily living. A few months ago he was  actively involved in sports and had very active life and currently not able to do anything. He denies any syncope or palpitations.   Past Medical History:  Diagnosis Date  . Age-related macular degeneration   . Allergic rhinitis   . Aortic valve disorder   . Aortic valve stenosis   . Carotid artery obstruction   . Chronic lower back pain   . Coronary arteriosclerosis in native artery   . Diverticulitis of colon   . DJD (degenerative joint disease)   . Dysthymia   . History of elevated PSA 05/2009  . HLD (hyperlipidemia)   . Hypertension   . Nocturnal hypoxemia   . OSA (obstructive sleep apnea)    intolerant to CPAP  . Paroxysmal supraventricular tachycardia ([place])   . Primary malignant neoplasm of bladder ([place])   . Prostate cancer [facility])     Past Surgical History:  Procedure Laterality Date  . ABDOMINAL SURGERY  1981   Meckles Diverticulum with volvulus  . APPENDECTOMY    . CARDIAC CATHETERIZATION N/A 01/17/2016   Procedure: Right/Left Heart Cath and Coronary Angiography;  Surgeon: Belva Crome, MD;  Location: [facility];  Service: Cardiovascular;  Laterality: N/A;  . CAROTID ENDARTERECTOMY     bilateral  . HERNIA REPAIR Right 2009    Current Medications: Outpatient Medications Prior to Visit  Medication Sig Dispense Refill  . amLODipine (NORVASC) 10 MG tablet Take 10 mg by mouth daily.    Marland Kitchen aspirin 81 MG tablet Take 81 mg by mouth daily.    . cholecalciferol (VITAMIN D) 1000 units tablet Take 1,000 Units by mouth daily.    . Coenzyme Q10 (COQ-10 PO) Take 1 capsule by mouth daily.    Marland Kitchen latanoprost (XALATAN) 0.005 % ophthalmic solution Place 1 drop into both eyes at bedtime.    Marland Kitchen lisinopril (PRINIVIL,ZESTRIL) 20 MG tablet Take 20 mg by mouth daily.    Marland Kitchen omeprazole (PRILOSEC) 20 MG capsule Take 20 mg by mouth daily.    . Probiotic Product (PROBIOTIC PO) Take 1 tablet by mouth daily.     No facility-administered medications prior to visit.      Allergies:    Hydrochlorothiazide; Ace inhibitors; Ciprofloxacin; Losartan; and Sulfa antibiotics   Social History   Social History  . Marital status: Married    Spouse name: N/A  . Number of children: N/A  . Years of education: N/A   Social History Main Topics  . Smoking status: Never Smoker  . Smokeless tobacco: Never Used  . Alcohol use No  . Drug use: No  . Sexual activity: Not Asked   Other Topics Concern  . None   Social History Narrative  . None     Family History:  The patient's family history includes Heart disease in his father and mother.   ROS:   Please see the history of present illness.    ROS All other systems reviewed and are negative.   PHYSICAL EXAM:   VS:  BP 130/62   Pulse 76   Ht 5\' 7"  (1.702 m)  Wt 131 lb (59.4 kg)   BMI 20.52 kg/m    GEN: Well nourished, well developed, in no acute distress  HEENT: normal  Neck: no JVD, carotid bruits, or masses Cardiac: RRR; 5/6 holosystolic murmurs, no S2, rubs, or gallops,no edema  Respiratory:  clear to auscultation bilaterally, normal work of breathing GI: soft, nontender, nondistended, + BS MS: no deformity or atrophy  Skin: warm and dry, no rash Neuro:  Alert and Oriented x 3, Strength and sensation are intact Psych: euthymic mood, full affect  Wt Readings from Last 3 Encounters:  03/11/16 131 lb (59.4 kg)  02/20/16 131 lb (59.4 kg)  01/17/16 130 lb (59 kg)    Studies/Labs Reviewed:   EKG:  EKG is ordered today.  The ekg ordered today demonstrates Sinus rhythm with ventricular related 66 bpm, right axis deviation, left bundle branch block, there is no prior EKG available for comparison.  Recent Labs: 01/14/2016: ALT 17; BUN 21; Creat 0.91; Hemoglobin 15.7; Platelets 252; Potassium 4.2; Sodium 142   Lipid Panel as described in history of present illness  Additional studies/ records that were reviewed today include:  I have reviewed his prior medical records including office visits, cardiac cath, stress  test, and echocardiogram. All described in history of present illness.  TTE: 10/10/2015 - Left ventricle: The cavity size was normal. Wall thickness was   increased in a pattern of mild LVH. Systolic function was normal.   The estimated ejection fraction was in the range of 60% to 65%.   Wall motion was normal; there were no regional wall motion   abnormalities. Doppler parameters are consistent with abnormal   left ventricular relaxation (grade 1 diastolic dysfunction).   Doppler parameters are consistent with indeterminate ventricular   filling pressure. - Aortic valve: Valve mobility was restricted. There was moderate   stenosis. Peak velocity (S): 387.9 cm/s. Mean gradient (S): 25 mm   Hg. Valve area (VTI): 1.04 cm^2. Valve area (Vmax): 1 cm^2. Valve   area (Vmean): 1.22 cm^2. - Mitral valve: There was trivial regurgitation. - Left atrium: The atrium was mildly dilated. - Right ventricle: The cavity size was normal. Wall thickness was   normal. Systolic function was normal. - Atrial septum: No defect or patent foramen ovale was identified   by color flow Doppler. - Tricuspid valve: There was mild regurgitation. - Inferior vena cava: The vessel was normal in size. The   respirophasic diameter changes were in the normal range (= 50%),   consistent with normal central venous pressure.  Left cardiac catheterization on 01/17/2016  Conclusion     Prox RCA to Mid RCA lesion, 20 %stenosed.  Mid RCA lesion, 80 %stenosed.  Ost 1st Mrg to 1st Mrg lesion, 80 %stenosed.  Mid Cx to Dist Cx lesion, 70 %stenosed.  Prox LAD to Dist LAD lesion, 40 %stenosed.  The left ventricular systolic function is normal.  LV end diastolic pressure is normal.  The left ventricular ejection fraction is 55-65% by visual estimate.  LV end diastolic pressure is normal.    There is three-vessel coronary calcification.  80% eccentric mid RCA stenosis.  80% ostial obtuse marginal #1  stenosis.  Moderate diffuse calcified LAD disease less than 50%.  Normal left ventricular systolic function and hemodynamics  Normal pulmonary pressures and pulmonary care 0.1.  Moderate to severe aortic stenosis with aortic valve area 1.04.   RECOMMENDATIONS:   Anti-ischemic therapy to potentially include beta blocker therapy, antiplatelet therapy, and statin therapy.  Intervention on  both the right coronary and obtuse marginal stenosis would be higher than usual risk due to heavy calcification and vessel tortuosity. Initial inclination is medical therapy in the absence of clear anginal symptoms.  Fatigue, you've an arrest is uncertain etiology. Exclude other possibilities including depression, although on one systemic illnesses.       ASSESSMENT:    1. Coronary artery disease involving native coronary artery of native heart without angina pectoris   2. Nonrheumatic aortic valve stenosis   3. DOE (dyspnea on exertion)   4. Carotid disease, bilateral ([place])   5. Mixed hyperlipidemia   6. LBBB (left bundle branch block)    4. Peripheral arterial disease 5. Hyperlipidemia    PLAN:  In order of problems listed above:  Coronary artery disease - Severe mid RCA disease, OM1 disease and left circumflex disease,  high-risk intervention if medical therapy fails.The patient is severely symptomatic and in addition has severe aortic stenosis feel referred to Dr. Roxy Manns for AVR and CABG.   2. Aortic stenosis -moderate on the echocardiogram in April 2017, however symptoms significantly progressed in the last 2 months, he has no S2 and physical exam this is most probably severe now. He is being referred for AVR and CABG.  3. Hypertension - controlled.  4. Hyperlipidemia - acceptable lipids in 2012, not on statins as he had memory impairment in the past.  5. Carotid disease status post endarterectomy in 1997, he currently has no bruits and no significant dizziness.  6. Left bundle  branch block - chronic  Medication Adjustments/Labs and Tests Ordered: Current medicines are reviewed at length with the patient today.  Concerns regarding medicines are outlined above.  Medication changes, Labs and Tests ordered today are listed in the Patient Instructions below. Patient Instructions  Medication Instructions:   Your physician recommends that you continue on your current medications as directed. Please refer to the Current Medication list given to you today.    Follow-Up:  2 MONTHS WITH DR Meda Coffee     If you need a refill on your cardiac medications before your next appointment, please call your pharmacy.      Signed, Ena Dawley, MD  03/11/2016 10:12 AM    [address], [place], [place]  [address] Phone: [phone]; Fax: [phone]

## 2016-03-11 NOTE — Patient Instructions (Signed)
Medication Instructions:   Your physician recommends that you continue on your current medications as directed. Please refer to the Current Medication list given to you today.    Follow-Up:  2 MONTHS WITH DR Meda Coffee     If you need a refill on your cardiac medications before your next appointment, please call your pharmacy.

## 2016-03-12 ENCOUNTER — Institutional Professional Consult (permissible substitution) (INDEPENDENT_AMBULATORY_CARE_PROVIDER_SITE_OTHER): Payer: Medicare Other | Admitting: Thoracic Surgery (Cardiothoracic Vascular Surgery)

## 2016-03-12 ENCOUNTER — Encounter: Payer: Self-pay | Admitting: Thoracic Surgery (Cardiothoracic Vascular Surgery)

## 2016-03-12 VITALS — BP 145/86 | HR 90 | Resp 19 | Ht 67.0 in | Wt 129.0 lb

## 2016-03-12 DIAGNOSIS — I350 Nonrheumatic aortic (valve) stenosis: Secondary | ICD-10-CM

## 2016-03-12 DIAGNOSIS — R0602 Shortness of breath: Secondary | ICD-10-CM | POA: Diagnosis not present

## 2016-03-12 DIAGNOSIS — I25119 Atherosclerotic heart disease of native coronary artery with unspecified angina pectoris: Secondary | ICD-10-CM | POA: Diagnosis not present

## 2016-03-12 NOTE — Progress Notes (Signed)
I will review the angiogram tomorrow when in the lab and speak with you. Thanks for the update.

## 2016-03-12 NOTE — Patient Instructions (Signed)
Continue all previous medications without any changes at this time  Call your cardiologist and/or go directly to the emergency room for symptoms of chest/neck pain that do not resolve within 15 minutes of rest

## 2016-03-12 NOTE — Progress Notes (Signed)
[facility] AND [facility]  CARDIOTHORACIC SURGERY CONSULTATION REPORT  Referring Provider is Dorothy Spark, MD PCP is Velna Hatchet, MD  Chief Complaint  Patient presents with  . Aortic Stenosis    ECHO 10/10/15, CATH 01/17/16  . Shortness of Breath    ON EXERTION  . Fatigue    HPI:  Patient is an 82 year old gentleman with history of aortic stenosis, coronary artery disease, hypertension, cerebrovascular disease, peripheral vascular disease, and hyperlipidemia who is been referred for surgical consultation to discuss treatment options for management of aortic stenosis and multivessel coronary artery disease. The patient states that he has known about the presence of a heart murmur for most of his adult life. He has been retired for more than 25 years, and in retirement he has moved around the country fair a bit. 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 He still walks 2 miles a day although he has to use a walker because of severe pain in his knees. He moved to [place] within the past 2 years, prior to that having been followed by a cardiologist in [place].  By report his last transthoracic echocardiogram was performed in 2015 and revealed normal left ventricular systolic function with mild to moderate aortic stenosis. In 2013 he reportedly had a diagnostic cardiac catheterization performed that revealed multivessel coronary artery disease with 40% stenosis in the left anterior setting coronary artery, 60% stenosis in the left circumflex going artery, and 60% stenosis in the right coronary artery. After moving to [facility] the patient was referred to Dr. Meda Coffee for follow-up who first evaluated the patient in April of this year. The patient had an echo performed 10/10/2015 which revealed moderate to  severe aortic stenosis with peak velocity across the aortic valve measured 3.9 m/s corresponding to mean transvalvular gradient estimated 25 mmHg. The DVI was reported 0.22 and aortic valve area estimated 1.0 cm. Left ventricular systolic function remained normal with ejection fraction estimated 60-65%. Over the past few months the patient has developed progressive symptoms of exertional shortness of breath and burning pain in his upper chest and throat. Symptoms are almost always brought on with physical exertion and relieved by rest. The patient was seen in the office by Dr. Meda Coffee 01/14/2016 and referred for diagnostic cardiac catheterization. Catheterization performed 01/17/2016 by Dr. Tamala Julian confirmed the presence of aortic stenosis with mean transvalvular gradient measured 25.1 mmHg corresponding to aortic valve area calculated 1.04 cm and valve area index 0.62 cm/BSA.  Coronary angiography revealed multivessel coronary artery disease with discrete eccentric 80% stenosis of the mid right coronary artery, 80% ostial stenosis of a large first obtuse marginal branch of the left circumflex coronary artery, and diffuse nonobstructive disease in the left anterior descending coronary artery. Medical therapy was recommended. The patient was seen in follow-up recently by Dr. Meda Coffee and continued to complain of accelerating symptoms of exertional shortness of breath and discomfort in the upper chest and neck. He was subsequently referred for surgical consultation.  The patient is married and lives locally in [place] with his wife. He has remained physically active and completely functionally independent all of his adult life. He continues to exercise on a daily basis but he has been severely limited for the past month or 2 because of severe exertional shortness of breath and chest pain. He now  states that he can't do much of anything without getting short of breath, tired, or developing palpitations with chest  discomfort. He has had palpitations associated with chest discomfort and severe dizzy spells with one near syncopal event. Symptoms of chest discomfort usually resolve within 10-15 minutes of rest. The patient denies any nocturnal angina or shortness of breath. He denies any history of PND, orthopnea, or lower extremity edema. His mobility is slightly limited because of severe degenerative arthritis in both knees, but he still continues to exercise on a regular basis either walking with his walker or exercising in a swimming pool.    Past Medical History:  Diagnosis Date  . Age-related macular degeneration   . Allergic rhinitis   . Aortic valve stenosis   . Carotid artery obstruction   . Chronic lower back pain   . Coronary arteriosclerosis in native artery   . Diverticulitis of colon   . DJD (degenerative joint disease)   . Dysthymia   . History of elevated PSA 05/2009  . HLD (hyperlipidemia)   . Hypertension   . Nocturnal hypoxemia   . OSA (obstructive sleep apnea)    intolerant to CPAP  . Paroxysmal supraventricular tachycardia ([place])   . Primary malignant neoplasm of bladder ([place])   . Prostate cancer [facility])     Past Surgical History:  Procedure Laterality Date  . ABDOMINAL SURGERY  1981   Meckles Diverticulum with volvulus  . APPENDECTOMY    . CARDIAC CATHETERIZATION N/A 01/17/2016   Procedure: Right/Left Heart Cath and Coronary Angiography;  Surgeon: [place] Crome, MD;  Location: [place];  Service: Cardiovascular;  Laterality: N/A;  . CAROTID ENDARTERECTOMY     bilateral  . HERNIA REPAIR Right 2009    Family History  Problem Relation Age of Onset  . Heart disease Mother   . Heart disease Father     Social History   Social History  . Marital status: Married    Spouse name: N/A  . Number of children: N/A  . Years of education: N/A   Occupational History  . retired    Social History Main Topics  . Smoking status: Never Smoker  . Smokeless tobacco:  Never Used  . Alcohol use No  . Drug use: No  . Sexual activity: Not on file   Other Topics Concern  . Not on file   Social History Narrative  . No narrative on file    Current Outpatient Prescriptions  Medication Sig Dispense Refill  . amLODipine (NORVASC) 10 MG tablet Take 10 mg by mouth daily.    Marland Kitchen aspirin 81 MG tablet Take 81 mg by mouth daily.    . cholecalciferol (VITAMIN D) 1000 units tablet Take 1,000 Units by mouth daily.    . Coenzyme Q10 (COQ-10 PO) Take 1 capsule by mouth daily.    Marland Kitchen latanoprost (XALATAN) 0.005 % ophthalmic solution Place 1 drop into both eyes at bedtime.    Marland Kitchen lisinopril (PRINIVIL,ZESTRIL) 20 MG tablet Take 20 mg by mouth daily.    Marland Kitchen omeprazole (PRILOSEC) 20 MG capsule Take 20 mg by mouth daily.    . Probiotic Product (PROBIOTIC PO) Take 1 tablet by mouth daily.     No current facility-administered medications for this visit.     Allergies  Allergen Reactions  . Hydrochlorothiazide Anaphylaxis  . Ace Inhibitors Cough  . Ciprofloxacin   . Losartan Cough  . Sulfa Antibiotics       Review of Systems:   General:  normal appetite, decreased energy, no weight gain, no weight loss, no fever  Cardiac:  + chest pain with exertion, no chest pain at rest, + SOB with exertion, no resting SOB, no PND, no orthopnea, + palpitations, + arrhythmia, no atrial fibrillation, no LE edema, + dizzy spells, + near syncope  Respiratory:  + exertional shortness of breath, no home oxygen, no productive cough, + dry cough, no bronchitis, + wheezing, no hemoptysis, no asthma, no pain with inspiration or cough, + sleep apnea, no CPAP at night  GI:   no difficulty swallowing, + reflux, no frequent heartburn, no hiatal hernia, no abdominal pain, no constipation, no diarrhea, no hematochezia, no hematemesis, no melena  GU:   no dysuria,  + frequency, no urinary tract infection, no hematuria, no enlarged prostate, no kidney stones, no kidney disease  Vascular:  no pain  suggestive of claudication, no pain in feet, no leg cramps, no varicose veins, no DVT, no non-healing foot ulcer  Neuro:   no stroke, no TIA's, no seizures, no headaches, no temporary blindness one eye,  no slurred speech, no peripheral neuropathy, + chronic pain, no instability of gait, mild memory/cognitive dysfunction  Musculoskeletal: + arthritis, + joint swelling, no myalgias, some difficulty walking, slightly decreased mobility   Skin:   no rash, no itching, no skin infections, no pressure sores or ulcerations  Psych:   + anxiety, + depression, no nervousness, no unusual recent stress  Eyes:   + blurry vision, no floaters, + recent vision changes, + wears glasses or contacts  ENT:   + hearing loss, no loose or painful teeth, no dentures, last saw dentist 2016  Hematologic:  + easy bruising, no abnormal bleeding, no clotting disorder, no frequent epistaxis  Endocrine:  no diabetes, does not check CBG's at home           Physical Exam:   BP (!) 145/86   Pulse 90   Resp 19   Ht 5\' 7"  (1.702 m)   Wt 129 lb (58.5 kg)   SpO2 96% Comment: ON RA  BMI 20.20 kg/m   General:  Thin,  well-appearing  HEENT:  Unremarkable   Neck:   no JVD, no bruits, no adenopathy   Chest:   clear to auscultation, symmetrical breath sounds, no wheezes, no rhonchi   CV:   RRR, grade III/VI crescendo/decrescendo murmur heard best at RUSB,  no diastolic murmur  Abdomen:  soft, non-tender, no masses   Extremities:  warm, well-perfused, pulses diminished but palpable, no LE edema  Rectal/GU  Deferred  Neuro:   Grossly non-focal and symmetrical throughout  Skin:   Clean and dry, no rashes, no breakdown   Diagnostic Tests:  Transthoracic Echocardiography  Patient:    Zadrian, Mccauley MR #:       [id number] Study Date: 10/10/2015 Gender:     M Age:        44 Height:     170.2 cm Weight:     58.1 kg BSA:        1.65 m^2 Pt. Status: Room:   ATTENDING    Lyman Bishop MD  Gaylene Brooks, M.D.  REFERRING    Ena Dawley, M.D.  PERFORMING   [facility], Outpatient  SONOGRAPHER  [facility], RDCS  cc:  ------------------------------------------------------------------- LV EF: 60% -   65%  ------------------------------------------------------------------- Indications:      Aortic Stenosis (I35.0).  ------------------------------------------------------------------- History:   PMH:  SVT, Left Bundle Branch Block, Prostate  Cancer, Obstructive Sleep Apnea.  Coronary artery disease.  Aortic valve disease.  Risk factors:  Family history of coronary artery disease. Hypertension. Dyslipidemia.  ------------------------------------------------------------------- Study Conclusions  - Left ventricle: The cavity size was normal. Wall thickness was   increased in a pattern of mild LVH. Systolic function was normal.   The estimated ejection fraction was in the range of 60% to 65%.   Wall motion was normal; there were no regional wall motion   abnormalities. Doppler parameters are consistent with abnormal   left ventricular relaxation (grade 1 diastolic dysfunction).   Doppler parameters are consistent with indeterminate ventricular   filling pressure. - Aortic valve: Valve mobility was restricted. There was moderate   stenosis. Peak velocity (S): 387.9 cm/s. Mean gradient (S): 25 mm   Hg. Valve area (VTI): 1.04 cm^2. Valve area (Vmax): 1 cm^2. Valve   area (Vmean): 1.22 cm^2. - Mitral valve: There was trivial regurgitation. - Left atrium: The atrium was mildly dilated. - Right ventricle: The cavity size was normal. Wall thickness was   normal. Systolic function was normal. - Atrial septum: No defect or patent foramen ovale was identified   by color flow Doppler. - Tricuspid valve: There was mild regurgitation. - Inferior vena cava: The vessel was normal in size. The   respirophasic diameter changes were in the normal range (= 50%),   consistent with normal  central venous pressure.  Transthoracic echocardiography.  M-mode, complete 2D, spectral Doppler, and color Doppler.  Birthdate:  Patient birthdate: Sep 19, 1929.  Age:  Patient is 82 yr old.  Sex:  Gender: male. BMI: 20 kg/m^2.  Blood pressure:     160/66  Patient status: Outpatient.  Study date:  Study date: 10/10/2015. Study time: 02:10 PM.  Location:  [facility]  -------------------------------------------------------------------  ------------------------------------------------------------------- Left ventricle:  The cavity size was normal. Wall thickness was increased in a pattern of mild LVH. Systolic function was normal. The estimated ejection fraction was in the range of 60% to 65%. Wall motion was normal; there were no regional wall motion abnormalities. Doppler parameters are consistent with abnormal left ventricular relaxation (grade 1 diastolic dysfunction). Doppler parameters are consistent with indeterminate ventricular filling pressure.  ------------------------------------------------------------------- Aortic valve:   Noncalcified annulus. Trileaflet; severely thickened, severely calcified leaflets. Valve mobility was restricted.  Doppler:   There was moderate stenosis.   There was no regurgitation.    VTI ratio of LVOT to aortic valve: 0.23. Valve area (VTI): 1.04 cm^2. Indexed valve area (VTI): 0.63 cm^2/m^2. Peak velocity ratio of LVOT to aortic valve: 0.22. Valve area (Vmax): 1 cm^2. Indexed valve area (Vmax): 0.61 cm^2/m^2. Mean velocity ratio of LVOT to aortic valve: 0.27. Valve area (Vmean): 1.22 cm^2. Indexed valve area (Vmean): 0.74 cm^2/m^2.    Mean gradient (S): 25 mm Hg. Peak gradient (S): 60 mm Hg.  ------------------------------------------------------------------- Aorta:  Aortic root: The aortic root was normal in size.  ------------------------------------------------------------------- Mitral valve:   Structurally normal valve.    Mobility was not restricted.  Doppler:  Transvalvular velocity was within the normal range. There was no evidence for stenosis. There was trivial regurgitation.  ------------------------------------------------------------------- Left atrium:  The atrium was mildly dilated.  ------------------------------------------------------------------- Atrial septum:  No defect or patent foramen ovale was identified by color flow Doppler.  ------------------------------------------------------------------- Right ventricle:  The cavity size was normal. Wall thickness was normal. Systolic function was normal.  ------------------------------------------------------------------- Pulmonic valve:    Structurally normal valve.   Cusp separation was normal.  Doppler:  Transvalvular  velocity was within the normal range. There was no evidence for stenosis. There was no regurgitation.  ------------------------------------------------------------------- Tricuspid valve:   Structurally normal valve.    Doppler: Transvalvular velocity was within the normal range. There was mild regurgitation.  ------------------------------------------------------------------- Pulmonary artery:   The main pulmonary artery was normal-sized. Systolic pressure was within the normal range.  ------------------------------------------------------------------- Right atrium:  The atrium was normal in size.  ------------------------------------------------------------------- Pericardium:  There was no pericardial effusion.  ------------------------------------------------------------------- Systemic veins: Inferior vena cava: The vessel was normal in size. The respirophasic diameter changes were in the normal range (= 50%), consistent with normal central venous pressure. Diameter: 19.2 mm.  ------------------------------------------------------------------- Measurements   IVC                                        Value          Reference  ID                                        19.2  mm       ---------    Left ventricle                            Value          Reference  LV ID, ED, PLAX chordal           (L)     39.9  mm       43 - 52  LV ID, ES, PLAX chordal                   23.6  mm       23 - 38  LV fx shortening, PLAX chordal            41    %        >=29  LV PW thickness, ED                       10.5  mm       ---------  IVS/LV PW ratio, ED                       0.86           <=1.3  Stroke volume, 2D                         93    ml       ---------  Stroke volume/bsa, 2D                     56    ml/m^2   ---------  LV ejection fraction, 1-p A4C             61    %        ---------  LV end-diastolic volume, 2-p              101   ml       ---------  LV end-systolic volume, 2-p               46    ml       ---------  LV ejection fraction, 2-p                 54    %        ---------  Stroke volume, 2-p                        55    ml       ---------  LV end-diastolic volume/bsa, 2-p          61    ml/m^2   ---------  LV end-systolic volume/bsa, 2-p           28    ml/m^2   ---------  Stroke volume/bsa, 2-p                    33.3  ml/m^2   ---------  LV e&', lateral                            6.27  cm/s     ---------  LV E/e&', lateral                          9.6            ---------  LV s&', lateral                            14.7  cm/s     ---------  LV e&', medial                             6.36  cm/s     ---------  LV E/e&', medial                           9.47           ---------  LV e&', average                            6.32  cm/s     ---------  LV E/e&', average                          9.53           ---------  LV ejection time                          400   ms       ---------    Ventricular septum                        Value          Reference  IVS thickness, ED                         9.05  mm       ---------    LVOT                                      Value           Reference  LVOT  ID, S                                24    mm       ---------  LVOT area                                 4.52  cm^2     ---------  LVOT peak velocity, S                     87.1  cm/s     ---------  LVOT mean velocity, S                     63    cm/s     ---------  LVOT VTI, S                               20.6  cm       ---------  LVOT peak gradient, S                     3     mm Hg    ---------    Aortic valve                              Value          Reference  Aortic valve peak velocity, S             387.9 cm/s     ---------  Aortic valve mean velocity, S             234   cm/s     ---------  Aortic valve VTI, S                       87.74 cm       ---------  Aortic mean gradient, S                   25    mm Hg    ---------  Aortic peak gradient, S                   60    mm Hg    ---------  VTI ratio, LVOT/AV                        0.23           ---------  Aortic valve area, VTI                    1.04  cm^2     ---------  Aortic valve area/bsa, VTI                0.63  cm^2/m^2 ---------  Velocity ratio, peak, LVOT/AV             0.22           ---------  Aortic valve area, peak velocity          1     cm^2     ---------  Aortic valve area/bsa, peak  0.61  cm^2/m^2 ---------  velocity  Velocity ratio, mean, LVOT/AV             0.27           ---------  Aortic valve area, mean velocity          1.22  cm^2     ---------  Aortic valve area/bsa, mean               0.74  cm^2/m^2 ---------  velocity    Aorta                                     Value          Reference  Aortic root ID, ED                        28    mm       ---------    Left atrium                               Value          Reference  LA ID, A-P, ES                            36    mm       ---------  LA ID/bsa, A-P                            2.18  cm/m^2   <=2.2  LA volume, S                              53    ml       ---------  LA volume/bsa, S                           32.1  ml/m^2   ---------  LA volume, ES, 1-p A4C                    35    ml       ---------  LA volume/bsa, ES, 1-p A4C                21.2  ml/m^2   ---------  LA volume, ES, 1-p A2C                    72    ml       ---------  LA volume/bsa, ES, 1-p A2C                43.6  ml/m^2   ---------    Mitral valve                              Value          Reference  Mitral E-wave peak velocity               60.2  cm/s     ---------  Mitral A-wave peak velocity  81.9  cm/s     ---------  Mitral deceleration time          (H)     380   ms       150 - 230  Mitral E/A ratio, peak                    0.7            ---------    Tricuspid valve                           Value          Reference  Tricuspid regurg peak velocity            314   cm/s     ---------  Tricuspid peak RV-RA gradient             39    mm Hg    ---------    Right ventricle                           Value          Reference  RV s&', lateral, S                         14.7  cm/s     ---------  Legend: (L)  and  (H)  mark values outside specified reference range.  ------------------------------------------------------------------- Prepared and Electronically Authenticated by  Skeet Latch, MD 2017-05-10T18:52:25    Procedures   Right/Left Heart Cath and Coronary Angiography  Conclusion     Prox RCA to Mid RCA lesion, 20 %stenosed.  Mid RCA lesion, 80 %stenosed.  Ost 1st Mrg to 1st Mrg lesion, 80 %stenosed.  Mid Cx to Dist Cx lesion, 70 %stenosed.  Prox LAD to Dist LAD lesion, 40 %stenosed.  The left ventricular systolic function is normal.  LV end diastolic pressure is normal.  The left ventricular ejection fraction is 55-65% by visual estimate.  LV end diastolic pressure is normal.    There is three-vessel coronary calcification.  80% eccentric mid RCA stenosis.  80% ostial obtuse marginal #1 stenosis.  Moderate diffuse calcified LAD disease less than 50%.  Normal left  ventricular systolic function and hemodynamics  Normal pulmonary pressures and pulmonary care 0.1.  Moderate to severe aortic stenosis with aortic valve area 1.04.   RECOMMENDATIONS:   Anti-ischemic therapy to potentially include beta blocker therapy, antiplatelet therapy, and statin therapy.  Intervention on both the right coronary and obtuse marginal stenosis would be higher than usual risk due to heavy calcification and vessel tortuosity. Initial inclination is medical therapy in the absence of clear anginal symptoms.  Fatigue, you've an arrest is uncertain etiology. Exclude other possibilities including depression, although on one systemic illnesses.   Indications   Fatigue due to excessive exertion, initial encounter [T73.3XXA (ICD-10-CM)]  SOB (shortness of breath) on exertion [R06.02 (ICD-10-CM)]  Coronary artery disease involving native coronary artery of native heart without angina pectoris [I25.10 (ICD-10-CM)]  Aortic stenosis [I35.0 (ICD-10-CM)]  Procedural Details/Technique   Technical Details INDICATION: Vague complaints of exertional fatigue and dyspnea. No chest pain. History of moderate aortic valve disease and prior documentation of nonobstructive coronary disease.  An 18-gauge IV was started in the right antecubital vein. After 1% Xylocaine local infiltration a 5 French radial sheath was then advanced into the vein.  Right heart catheterization was performed with a 5 French Swan-Ganz catheter. Mixed venous main pulmonary artery oxygen saturation was obtained. The catheter was removed.  The right radial area was sterilely prepped and draped. Intravenous sedation with Versed and fentanyl was administered. 1% Xylocaine was infiltrated to achieve local analgesia. A double wall stick with an angiocath was utilized to obtain intra-arterial access. The modified Seldinger technique was used to place a 32F " Slender" sheath in the right radial artery. Weight based heparin was  administered. Coronary angiography was done using 5 F catheters. Right coronary angiography was performed with a JR4. Left ventricular hemodymic recordings and angiography was done using the JR 4 catheter and hand injection. Left coronary angiography was performed with a JL 3.5 cm.  Hemostasis was achieved using a pneumatic band.  During this procedure the patient is administered a total of Versed 1 mg and Fentanyl 50 mg to achieve and maintain moderate conscious sedation. The patient's heart rate, blood pressure, and oxygen saturation are monitored continuously during the procedure. The period of conscious sedation is 52 minutes, of which I was present face-to-face 100% of this time.   Estimated blood loss <50 mL. . During this procedure the patient was administered the following to achieve and maintain moderate conscious sedation: Versed 1 mg, Fentanyl 50 mcg, while the patient's heart rate, blood pressure, and oxygen saturation were continuously monitored. The period of conscious sedation was 52 minutes, of which I was present face-to-face 100% of this time.    Coronary Findings   Dominance: Right  Left Anterior Descending  Prox LAD to Dist LAD lesion, 40% stenosed. The lesion is type C and concentric. The lesion is calcified.  Left Circumflex  Mid Cx to Dist Cx lesion, 70% stenosed. The lesion is type C. The lesion is calcified.  First Obtuse Marginal Branch  Ost 1st Mrg to 1st Mrg lesion, 80% stenosed. The lesion is type C.  Right Coronary Artery  Prox RCA to Mid RCA lesion, 20% stenosed. The lesion is severely calcified.  Mid RCA lesion, 80% stenosed. The lesion is eccentric. The lesion is severely calcified.  Right Heart   Right Heart Pressures LV EDP is normal.    Right Atrium Right atrial pressure is normal.    Wall Motion   Resting               Left Heart   Left Ventricle The left ventricular size is normal. The left ventricular systolic function is normal. LV end  diastolic pressure is normal. The left ventricular ejection fraction is 55-65% by visual estimate. No regional wall motion abnormalities.    Coronary Diagrams   Diagnostic Diagram     Implants     No implant documentation for this case.  PACS Images   Show images for Cardiac catheterization   Link to Procedure Log   Procedure Log    Hemo Data   Flowsheet Row Most Recent Value  Fick Cardiac Output 4.39 L/min  Fick Cardiac Output Index 2.6 (L/min)/BSA  Aortic Mean Gradient 25.1 mmHg  Aortic Peak Gradient 26 mmHg  Aortic Valve Area 1.04  Aortic Value Area Index 0.62 cm2/BSA  RA A Wave 4 mmHg  RA V Wave 4 mmHg  RA Mean 2 mmHg  RV Systolic Pressure 34 mmHg  RV Diastolic Pressure -1 mmHg  RV EDP 6 mmHg  PA Systolic Pressure 32 mmHg  PA Diastolic Pressure 6 mmHg  PA Mean 14 mmHg  PW A Wave 5 mmHg  PW  V Wave 4 mmHg  PW Mean 4 mmHg  AO Systolic Pressure 710 mmHg  AO Diastolic Pressure 54 mmHg  AO Mean 81 mmHg  LV Systolic Pressure 626 mmHg  LV Diastolic Pressure 5 mmHg  LV EDP 11 mmHg  Arterial Occlusion Pressure Extended Systolic Pressure 948 mmHg  Arterial Occlusion Pressure Extended Diastolic Pressure 49 mmHg  Arterial Occlusion Pressure Extended Mean Pressure 75 mmHg  Left Ventricular Apex Extended Systolic Pressure 546 mmHg  Left Ventricular Apex Extended Diastolic Pressure 5 mmHg  Left Ventricular Apex Extended EDP Pressure 10 mmHg  QP/QS 1  TPVR Index 5.78 HRUI  TSVR Index 31.21 HRUI  PVR SVR Ratio 0.14  TPVR/TSVR Ratio 0.19     STS Risk Calculator  Procedure    AVR + CABG  Risk of Mortality   5.7% Morbidity or Mortality  29.1% Prolonged LOS   16.4% Short LOS    16.3% Permanent Stroke   3.6% Prolonged Vent Support  17.2% DSW Infection    0.3% Renal Failure    8.5% Reoperation    12.6%    Impression:  Patient has stage D severe symptomatic aortic stenosis and multivessel coronary artery disease. He presents with recent onset of progressive  symptoms of exertional shortness of breath and chest discomfort radiating to his neck and jaw consistent with angina pectoris and chronic diastolic congestive heart failure. The patient has also had symptoms of palpitations associated with severe dizzy spells and near syncope.  I have personally reviewed the patient's most recent transthoracic echocardiogram and diagnostic cardiac catheterization. Echocardiogram demonstrates findings consistent with severe aortic stenosis. The aortic valve is trileaflet with severe calcification, thickening, and restricted leaflet mobility involving all 3 leaflets of the aortic valve. The peak velocity across the aortic valve measured 3.9 m/s and the DVI was quite low. Left ventricular systolic function remains normal. Aortic valve area was calculator 1.0 cm.  Diagnostic cardiac catheterization confirmed similar findings and also revealed the presence of significant multivessel coronary artery disease. I am particular concern by the appearance of the discrete high-grade stenosis in the mid right coronary artery.  Options include conventional surgical aortic valve replacement with coronary artery bypass grafting versus a hybrid approach including PCI and stenting with transcatheter aortic valve replacement.  Risks associated with conventional surgery would be of at least intermediate risk because of the patient's advanced age and associated comorbid medical conditions. I think the patient would probably do fine with surgery, but risks would not be insignificant and the patient's convalescence would undoubtedly be affected by his advanced age.  The patient's coronary anatomy was felt to be relatively unfavorable for percutaneous coronary intervention at the time of his recent catheterization. It might be reasonable to consider PCI and stenting of the right coronary artery as this appears to be a fairly discrete lesion with critical anatomical appearance. In my opinion this may be in  fact the problem that has caused the patient's recent acceleration of symptoms.    Plan:  I discussed options at length with the patient and his wife in the office today. We reviewed independently the patient's previous echo and cath films and discussed treatment options for both aortic stenosis and coronary artery disease at length. The patient is particularly interested in considering a less invasive approach because of his concerns regarding advanced age and recovery from open heart surgery. As a result, we will plan to review his diagnostic cath films with other members of the multidisciplinary heart team to discuss whether not PCI  and stenting of the right coronary artery might be feasible.  In the meanwhile we will obtain cardiac gated CT angiogram of the heart and CT angiogram of the aorta and iliac vessels to further evaluate the feasibility of transcatheter aortic valve replacement. All of the patient's diagnostic tests will be reviewed by a multidisciplinary team at specialists and the patient will return within the next few weeks to discuss treatment options further. During the interim period of time the patient has been instructed to call Dr. Meda Coffee or present directly to the emergency department for any symptoms of prolonged substernal chest pain and shortness of breath that are unrelieved with rest. All of his questions have been addressed.   I spent in excess of 90 minutes during the conduct of this office consultation and >50% of this time involved direct face-to-face encounter with the patient for counseling and/or coordination of their care.     Valentina Gu. Roxy Manns, MD 03/12/2016 2:05 PM

## 2016-03-13 ENCOUNTER — Telehealth: Payer: Self-pay | Admitting: *Deleted

## 2016-03-13 ENCOUNTER — Other Ambulatory Visit: Payer: Self-pay | Admitting: *Deleted

## 2016-03-13 DIAGNOSIS — I350 Nonrheumatic aortic (valve) stenosis: Secondary | ICD-10-CM

## 2016-03-13 NOTE — Progress Notes (Signed)
I reviewed the images and technically, RCA PCI is feasible. There is a small chance we will not be able to deliver the stent due to calcification. My approach would be spot stent localized to the mid RCA.  He really has far advanced diffuse CAD in all territories.

## 2016-03-13 NOTE — Telephone Encounter (Signed)
-----   Message from Belva Crome, MD sent at 03/13/2016  4:05 PM EDT -----   ----- Message ----- From: Rexene Alberts, MD Sent: 03/13/2016   7:36 AM To: Belva Crome, MD, Dorothy Spark, MD  Thanks.  I favor PCI of RCA followed by TAVR if risks of PCI aren't excessive.  He is pretty vigorous and probably would do fine w/ AVR + CABG but he is 89   ----- Message ----- From: Belva Crome, MD Sent: 03/13/2016   7:27 AM To: Dorothy Spark, MD, Rexene Alberts, MD    ----- Message ----- From: Rexene Alberts, MD Sent: 03/12/2016   2:38 PM To: Belva Crome, MD, Dorothy Spark, MD  Please see the attached note regarding our mutual patient from their visit in our office today.  CHO

## 2016-03-13 NOTE — Progress Notes (Signed)
I will see him in office and discuss. Will then set up PTCA/ Rotational atherectomy of RCA. Anderson Malta, please work him in [employer].

## 2016-03-13 NOTE — Telephone Encounter (Signed)
Belva Crome, MD at 03/12/2016 1:00 PM   Status: Signed    I will see him in office and discuss. Will then set up PTCA/ Rotational atherectomy of RCA. Anderson Malta, please work him in [employer].     Left message for pt to call back.   Ok to double book on 03/18/16 at [facility]

## 2016-03-13 NOTE — Telephone Encounter (Signed)
Will forward to triage to follow up tomorrow as I am out of the office for several days.

## 2016-03-14 ENCOUNTER — Telehealth: Payer: Self-pay | Admitting: Interventional Cardiology

## 2016-03-14 DIAGNOSIS — I350 Nonrheumatic aortic (valve) stenosis: Secondary | ICD-10-CM | POA: Diagnosis not present

## 2016-03-14 LAB — COMPLETE METABOLIC PANEL WITH GFR
ALBUMIN: 4.1 g/dL (ref 3.6–5.1)
ALK PHOS: 67 U/L (ref 40–115)
ALT: 20 U/L (ref 9–46)
AST: 25 U/L (ref 10–35)
BILIRUBIN TOTAL: 0.4 mg/dL (ref 0.2–1.2)
BUN: 20 mg/dL (ref 7–25)
CALCIUM: 9.3 mg/dL (ref 8.6–10.3)
CO2: 28 mmol/L (ref 20–31)
Chloride: 103 mmol/L (ref 98–110)
Creat: 1.09 mg/dL (ref 0.70–1.11)
GFR, EST AFRICAN AMERICAN: 72 mL/min (ref >=60)
GFR, EST NON AFRICAN AMERICAN: 62 mL/min (ref >=60)
Glucose, Bld: 85 mg/dL (ref 65–99)
POTASSIUM: 4.1 mmol/L (ref 3.5–5.3)
SODIUM: 140 mmol/L (ref 135–146)
TOTAL PROTEIN: 6.3 g/dL (ref 6.1–8.1)

## 2016-03-14 NOTE — Telephone Encounter (Signed)
Patient agreeable to 9am on 10/17.

## 2016-03-18 ENCOUNTER — Inpatient Hospital Stay (HOSPITAL_COMMUNITY)
Admission: EM | Admit: 2016-03-18 | Discharge: 2016-03-22 | DRG: 242 | Disposition: A | Discharge status: Home / Self Care | Payer: Medicare Other | Attending: Interventional Cardiology | Admitting: Interventional Cardiology

## 2016-03-18 ENCOUNTER — Encounter: Payer: Self-pay | Admitting: Interventional Cardiology

## 2016-03-18 ENCOUNTER — Ambulatory Visit (HOSPITAL_COMMUNITY): Payer: Medicare Other

## 2016-03-18 ENCOUNTER — Ambulatory Visit (INDEPENDENT_AMBULATORY_CARE_PROVIDER_SITE_OTHER): Payer: Medicare Other | Admitting: Interventional Cardiology

## 2016-03-18 ENCOUNTER — Ambulatory Visit (HOSPITAL_COMMUNITY): Admission: RE | Admit: 2016-03-18 | Payer: Medicare Other | Source: Ambulatory Visit

## 2016-03-18 ENCOUNTER — Ambulatory Visit: Payer: Medicare Other | Admitting: Physical Therapy

## 2016-03-18 ENCOUNTER — Inpatient Hospital Stay (HOSPITAL_COMMUNITY): Payer: Medicare Other

## 2016-03-18 ENCOUNTER — Observation Stay (HOSPITAL_BASED_OUTPATIENT_CLINIC_OR_DEPARTMENT_OTHER): Payer: Medicare Other

## 2016-03-18 VITALS — BP 112/66 | HR 80 | Ht 67.0 in | Wt 132.2 lb

## 2016-03-18 DIAGNOSIS — I495 Sick sinus syndrome: Secondary | ICD-10-CM | POA: Diagnosis present

## 2016-03-18 DIAGNOSIS — I5042 Chronic combined systolic (congestive) and diastolic (congestive) heart failure: Secondary | ICD-10-CM

## 2016-03-18 DIAGNOSIS — I6523 Occlusion and stenosis of bilateral carotid arteries: Secondary | ICD-10-CM | POA: Diagnosis present

## 2016-03-18 DIAGNOSIS — I739 Peripheral vascular disease, unspecified: Secondary | ICD-10-CM | POA: Diagnosis not present

## 2016-03-18 DIAGNOSIS — I700 Atherosclerosis of aorta: Secondary | ICD-10-CM | POA: Diagnosis not present

## 2016-03-18 DIAGNOSIS — G4733 Obstructive sleep apnea (adult) (pediatric): Secondary | ICD-10-CM | POA: Diagnosis present

## 2016-03-18 DIAGNOSIS — I5033 Acute on chronic diastolic (congestive) heart failure: Secondary | ICD-10-CM | POA: Diagnosis not present

## 2016-03-18 DIAGNOSIS — Z8249 Family history of ischemic heart disease and other diseases of the circulatory system: Secondary | ICD-10-CM

## 2016-03-18 DIAGNOSIS — I350 Nonrheumatic aortic (valve) stenosis: Secondary | ICD-10-CM | POA: Diagnosis not present

## 2016-03-18 DIAGNOSIS — I447 Left bundle-branch block, unspecified: Secondary | ICD-10-CM | POA: Diagnosis not present

## 2016-03-18 DIAGNOSIS — R0789 Other chest pain: Secondary | ICD-10-CM | POA: Diagnosis not present

## 2016-03-18 DIAGNOSIS — Z888 Allergy status to other drugs, medicaments and biological substances status: Secondary | ICD-10-CM

## 2016-03-18 DIAGNOSIS — E7800 Pure hypercholesterolemia, unspecified: Secondary | ICD-10-CM | POA: Diagnosis present

## 2016-03-18 DIAGNOSIS — M545 Low back pain: Secondary | ICD-10-CM | POA: Diagnosis present

## 2016-03-18 DIAGNOSIS — R0602 Shortness of breath: Secondary | ICD-10-CM | POA: Diagnosis not present

## 2016-03-18 DIAGNOSIS — I248 Other forms of acute ischemic heart disease: Secondary | ICD-10-CM | POA: Diagnosis not present

## 2016-03-18 DIAGNOSIS — I471 Supraventricular tachycardia: Secondary | ICD-10-CM | POA: Diagnosis not present

## 2016-03-18 DIAGNOSIS — I480 Paroxysmal atrial fibrillation: Principal | ICD-10-CM | POA: Diagnosis present

## 2016-03-18 DIAGNOSIS — R918 Other nonspecific abnormal finding of lung field: Secondary | ICD-10-CM | POA: Diagnosis present

## 2016-03-18 DIAGNOSIS — E785 Hyperlipidemia, unspecified: Secondary | ICD-10-CM | POA: Diagnosis not present

## 2016-03-18 DIAGNOSIS — I2510 Atherosclerotic heart disease of native coronary artery without angina pectoris: Secondary | ICD-10-CM | POA: Diagnosis present

## 2016-03-18 DIAGNOSIS — R0609 Other forms of dyspnea: Secondary | ICD-10-CM

## 2016-03-18 DIAGNOSIS — G8929 Other chronic pain: Secondary | ICD-10-CM | POA: Diagnosis present

## 2016-03-18 DIAGNOSIS — Z950 Presence of cardiac pacemaker: Secondary | ICD-10-CM

## 2016-03-18 DIAGNOSIS — I4891 Unspecified atrial fibrillation: Secondary | ICD-10-CM | POA: Diagnosis not present

## 2016-03-18 DIAGNOSIS — Z8546 Personal history of malignant neoplasm of prostate: Secondary | ICD-10-CM

## 2016-03-18 DIAGNOSIS — Z79899 Other long term (current) drug therapy: Secondary | ICD-10-CM

## 2016-03-18 DIAGNOSIS — I110 Hypertensive heart disease with heart failure: Secondary | ICD-10-CM | POA: Diagnosis present

## 2016-03-18 DIAGNOSIS — Z7982 Long term (current) use of aspirin: Secondary | ICD-10-CM

## 2016-03-18 DIAGNOSIS — R0600 Dyspnea, unspecified: Secondary | ICD-10-CM

## 2016-03-18 DIAGNOSIS — Z881 Allergy status to other antibiotic agents status: Secondary | ICD-10-CM

## 2016-03-18 DIAGNOSIS — I060 Rheumatic aortic stenosis: Secondary | ICD-10-CM | POA: Diagnosis not present

## 2016-03-18 DIAGNOSIS — Z8551 Personal history of malignant neoplasm of bladder: Secondary | ICD-10-CM

## 2016-03-18 DIAGNOSIS — R079 Chest pain, unspecified: Secondary | ICD-10-CM | POA: Diagnosis not present

## 2016-03-18 DIAGNOSIS — I745 Embolism and thrombosis of iliac artery: Secondary | ICD-10-CM | POA: Diagnosis not present

## 2016-03-18 DIAGNOSIS — Z01810 Encounter for preprocedural cardiovascular examination: Secondary | ICD-10-CM

## 2016-03-18 DIAGNOSIS — I2584 Coronary atherosclerosis due to calcified coronary lesion: Secondary | ICD-10-CM | POA: Diagnosis present

## 2016-03-18 DIAGNOSIS — H353190 Nonexudative age-related macular degeneration, unspecified eye, stage unspecified: Secondary | ICD-10-CM | POA: Diagnosis present

## 2016-03-18 DIAGNOSIS — I25119 Atherosclerotic heart disease of native coronary artery with unspecified angina pectoris: Secondary | ICD-10-CM

## 2016-03-18 DIAGNOSIS — Z882 Allergy status to sulfonamides status: Secondary | ICD-10-CM

## 2016-03-18 DIAGNOSIS — R002 Palpitations: Secondary | ICD-10-CM | POA: Diagnosis not present

## 2016-03-18 DIAGNOSIS — Z7901 Long term (current) use of anticoagulants: Secondary | ICD-10-CM

## 2016-03-18 HISTORY — DX: Chronic combined systolic (congestive) and diastolic (congestive) heart failure: I50.42

## 2016-03-18 LAB — COMPREHENSIVE METABOLIC PANEL
ALBUMIN: 4 g/dL (ref 3.5–5.0)
ALK PHOS: 64 U/L (ref 38–126)
ALT: 19 U/L (ref 17–63)
AST: 26 U/L (ref 15–41)
Anion gap: 10 (ref 5–15)
BUN: 20 mg/dL (ref 6–20)
CALCIUM: 9.5 mg/dL (ref 8.9–10.3)
CHLORIDE: 109 mmol/L (ref 101–111)
CO2: 21 mmol/L — AB (ref 22–32)
CREATININE: 1.12 mg/dL (ref 0.61–1.24)
GFR calc non Af Amer: 59 mL/min — ABNORMAL LOW (ref >60)
GLUCOSE: 93 mg/dL (ref 65–99)
Potassium: 3.9 mmol/L (ref 3.5–5.1)
SODIUM: 140 mmol/L (ref 135–145)
Total Bilirubin: 0.6 mg/dL (ref 0.3–1.2)
Total Protein: 6.4 g/dL — ABNORMAL LOW (ref 6.5–8.1)

## 2016-03-18 LAB — CBC WITH DIFFERENTIAL/PLATELET
BASOS ABS: 0 10*3/uL (ref 0.0–0.1)
Basophils Relative: 1 %
EOS PCT: 3 %
Eosinophils Absolute: 0.1 10*3/uL (ref 0.0–0.7)
HCT: 45.6 % (ref 39.0–52.0)
Hemoglobin: 15.7 g/dL (ref 13.0–17.0)
LYMPHS ABS: 1 10*3/uL (ref 0.7–4.0)
LYMPHS PCT: 21 %
MCH: 31.2 pg (ref 26.0–34.0)
MCHC: 34.4 g/dL (ref 30.0–36.0)
MCV: 90.5 fL (ref 78.0–100.0)
MONO ABS: 0.6 10*3/uL (ref 0.1–1.0)
MONOS PCT: 12 %
NEUTROS ABS: 3.2 10*3/uL (ref 1.7–7.7)
Neutrophils Relative %: 63 %
PLATELETS: 212 10*3/uL (ref 150–400)
RBC: 5.04 MIL/uL (ref 4.22–5.81)
RDW: 13 % (ref 11.5–15.5)
WBC: 4.9 10*3/uL (ref 4.0–10.5)

## 2016-03-18 LAB — CBC
HEMATOCRIT: 47.4 % (ref 39.0–52.0)
HEMOGLOBIN: 16 g/dL (ref 13.0–17.0)
MCH: 30.7 pg (ref 26.0–34.0)
MCHC: 33.8 g/dL (ref 30.0–36.0)
MCV: 91 fL (ref 78.0–100.0)
Platelets: 221 10*3/uL (ref 150–400)
RBC: 5.21 MIL/uL (ref 4.22–5.81)
RDW: 13 % (ref 11.5–15.5)
WBC: 5.1 10*3/uL (ref 4.0–10.5)

## 2016-03-18 LAB — BRAIN NATRIURETIC PEPTIDE
B NATRIURETIC PEPTIDE 5: 18.4 pg/mL (ref 0.0–100.0)
B NATRIURETIC PEPTIDE 5: 38.4 pg/mL (ref 0.0–100.0)

## 2016-03-18 LAB — I-STAT TROPONIN, ED: TROPONIN I, POC: 0.08 ng/mL (ref 0.00–0.08)

## 2016-03-18 LAB — BASIC METABOLIC PANEL
ANION GAP: 9 (ref 5–15)
BUN: 19 mg/dL (ref 6–20)
CALCIUM: 9.7 mg/dL (ref 8.9–10.3)
CO2: 25 mmol/L (ref 22–32)
Chloride: 106 mmol/L (ref 101–111)
Creatinine, Ser: 1.18 mg/dL (ref 0.61–1.24)
GFR, EST NON AFRICAN AMERICAN: 55 mL/min — AB (ref >60)
Glucose, Bld: 106 mg/dL — ABNORMAL HIGH (ref 65–99)
POTASSIUM: 4 mmol/L (ref 3.5–5.1)
SODIUM: 140 mmol/L (ref 135–145)

## 2016-03-18 LAB — HEPARIN LEVEL (UNFRACTIONATED): Heparin Unfractionated: 0.46 IU/mL (ref 0.30–0.70)

## 2016-03-18 LAB — TROPONIN I
TROPONIN I: 0.03 ng/mL — AB (ref <0.03)
Troponin I: <0.03 ng/mL (ref <0.03)

## 2016-03-18 LAB — TSH: TSH: 2.194 u[IU]/mL (ref 0.350–4.500)

## 2016-03-18 MED ORDER — HEPARIN BOLUS VIA INFUSION
3000.0000 [IU] | Freq: Once | INTRAVENOUS | Status: AC
  Administered 2016-03-18: 3000 [IU] via INTRAVENOUS
  Filled 2016-03-18: qty 3000

## 2016-03-18 MED ORDER — APIXABAN 2.5 MG PO TABS: 2.5000 mg | ORAL_TABLET | Freq: Two times a day (BID) | ORAL | Status: DC

## 2016-03-18 MED ORDER — ONDANSETRON HCL 4 MG/2ML IJ SOLN: 4.0000 mg | Freq: Four times a day (QID) | INTRAMUSCULAR | Status: DC | PRN

## 2016-03-18 MED ORDER — DILTIAZEM HCL 30 MG PO TABS: 30.0000 mg | ORAL_TABLET | Freq: Three times a day (TID) | ORAL | Status: DC

## 2016-03-18 MED ORDER — VITAMIN D 1000 UNITS PO TABS
1000.0000 [IU] | ORAL_TABLET | Freq: Every day | ORAL | Status: DC
  Administered 2016-03-19 – 2016-03-22 (×4): 1000 [IU] via ORAL
  Filled 2016-03-18 (×4): qty 1

## 2016-03-18 MED ORDER — SODIUM CHLORIDE 0.9% FLUSH: 3.0000 mL | INTRAVENOUS | Status: DC | PRN

## 2016-03-18 MED ORDER — AMIODARONE HCL 200 MG PO TABS
400.0000 mg | ORAL_TABLET | Freq: Two times a day (BID) | ORAL | Status: DC
  Administered 2016-03-18 – 2016-03-19 (×3): 400 mg via ORAL
  Filled 2016-03-18 (×3): qty 2

## 2016-03-18 MED ORDER — PANTOPRAZOLE SODIUM 40 MG PO TBEC
40.0000 mg | DELAYED_RELEASE_TABLET | Freq: Every day | ORAL | Status: DC
  Administered 2016-03-19 – 2016-03-22 (×4): 40 mg via ORAL
  Filled 2016-03-18 (×4): qty 1

## 2016-03-18 MED ORDER — ASPIRIN 81 MG PO CHEW: 324.0000 mg | CHEWABLE_TABLET | Freq: Once | ORAL | Status: DC

## 2016-03-18 MED ORDER — AMIODARONE LOAD VIA INFUSION
150.0000 mg | Freq: Once | INTRAVENOUS | Status: AC
  Administered 2016-03-18: 150 mg via INTRAVENOUS
  Filled 2016-03-18: qty 83.34

## 2016-03-18 MED ORDER — ASPIRIN 81 MG PO TABS: 81.0000 mg | ORAL_TABLET | Freq: Every day | ORAL | Status: DC

## 2016-03-18 MED ORDER — AMIODARONE HCL IN DEXTROSE 360-4.14 MG/200ML-% IV SOLN: 60.0000 mg/h | INTRAVENOUS | Status: DC

## 2016-03-18 MED ORDER — DILTIAZEM HCL-DEXTROSE 100-5 MG/100ML-% IV SOLN (PREMIX): 5.0000 mg/h | INTRAVENOUS | Status: DC

## 2016-03-18 MED ORDER — DILTIAZEM LOAD VIA INFUSION
20.0000 mg | Freq: Once | INTRAVENOUS | Status: DC
  Filled 2016-03-18: qty 20

## 2016-03-18 MED ORDER — ASPIRIN EC 81 MG PO TBEC
81.0000 mg | DELAYED_RELEASE_TABLET | Freq: Every day | ORAL | Status: DC
  Administered 2016-03-19 – 2016-03-22 (×4): 81 mg via ORAL
  Filled 2016-03-18 (×4): qty 1

## 2016-03-18 MED ORDER — LISINOPRIL 20 MG PO TABS
20.0000 mg | ORAL_TABLET | Freq: Every day | ORAL | Status: DC
  Administered 2016-03-19 – 2016-03-22 (×4): 20 mg via ORAL
  Filled 2016-03-18 (×4): qty 1

## 2016-03-18 MED ORDER — LATANOPROST 0.005 % OP SOLN
1.0000 [drp] | Freq: Every day | OPHTHALMIC | Status: DC
  Administered 2016-03-18 – 2016-03-21 (×4): 1 [drp] via OPHTHALMIC
  Filled 2016-03-18: qty 2.5

## 2016-03-18 MED ORDER — APIXABAN 2.5 MG PO TABS
2.5000 mg | ORAL_TABLET | Freq: Two times a day (BID) | ORAL | Status: DC
  Administered 2016-03-19 – 2016-03-22 (×7): 2.5 mg via ORAL
  Filled 2016-03-18 (×8): qty 1

## 2016-03-18 MED ORDER — SODIUM CHLORIDE 0.9% FLUSH
3.0000 mL | Freq: Two times a day (BID) | INTRAVENOUS | Status: DC
  Administered 2016-03-19 – 2016-03-20 (×2): 30 mL via INTRAVENOUS
  Administered 2016-03-20 – 2016-03-22 (×3): 3 mL via INTRAVENOUS

## 2016-03-18 MED ORDER — AMIODARONE HCL IN DEXTROSE 360-4.14 MG/200ML-% IV SOLN
30.0000 mg/h | INTRAVENOUS | Status: DC
  Filled 2016-03-18: qty 200

## 2016-03-18 MED ORDER — ACETAMINOPHEN 325 MG PO TABS: 650.0000 mg | ORAL_TABLET | ORAL | Status: DC | PRN

## 2016-03-18 MED ORDER — SODIUM CHLORIDE 0.9 % IV SOLN: 250.0000 mL | INTRAVENOUS | Status: DC | PRN

## 2016-03-18 MED ORDER — DILTIAZEM LOAD VIA INFUSION
10.0000 mg | Freq: Once | INTRAVENOUS | Status: DC
  Filled 2016-03-18: qty 10

## 2016-03-18 MED ORDER — HEPARIN (PORCINE) IN NACL 100-0.45 UNIT/ML-% IJ SOLN
850.0000 [IU]/h | INTRAMUSCULAR | Status: DC
  Administered 2016-03-18: 850 [IU]/h via INTRAVENOUS
  Filled 2016-03-18 (×2): qty 250

## 2016-03-18 MED ORDER — COQ-10 30 MG PO CAPS: ORAL_CAPSULE | Freq: Every day | ORAL | Status: DC

## 2016-03-18 MED ORDER — DILTIAZEM HCL-DEXTROSE 100-5 MG/100ML-% IV SOLN (PREMIX)
5.0000 mg/h | INTRAVENOUS | Status: DC
  Administered 2016-03-18: 5 mg/h via INTRAVENOUS
  Filled 2016-03-18: qty 100

## 2016-03-18 NOTE — ED Provider Notes (Signed)
[place] DEPT Provider Note   CSN: [id number] Arrival date & time: 03/18/16  1047     History   Chief Complaint Chief Complaint  Patient presents with  . Atrial Fibrillation    HPI Ryan [name] is a 82 y.o. male.  Patient with hx cad, AS, c/o episodes of chest discomfort in the past couple of weeks.  States feels sob, palpitations, and vague burning sensation. Can occur at rest, or with activity. Duration is variable, seconds to several minutes. Today more prolonged, at rest. Also feels generally fatigued. Prior recent workup demonstrated multi-vessel cad and AS.  Patient saw cardiologist today with similar symptoms, was noted to be in new onset afib, and sent to ED.  Patient denies current chest pain. Is in rapid afib.    The history is provided by the patient, the spouse and a relative.  Atrial Fibrillation  Associated symptoms include chest pain and shortness of breath. Pertinent negatives include no abdominal pain and no headaches.    Past Medical History:  Diagnosis Date  . Age-related macular degeneration   . Allergic rhinitis   . Aortic valve stenosis   . Carotid artery obstruction   . Chronic lower back pain   . Coronary arteriosclerosis in native artery   . Diverticulitis of colon   . DJD (degenerative joint disease)   . Dysthymia   . History of elevated PSA 05/2009  . HLD (hyperlipidemia)   . Hypertension   . Nocturnal hypoxemia   . OSA (obstructive sleep apnea)    intolerant to CPAP  . Paroxysmal supraventricular tachycardia ([place])   . Primary malignant neoplasm of bladder ([place])   . Prostate cancer [facility])     Patient Active Problem List   Diagnosis Date Noted  . PAF (paroxysmal atrial fibrillation) ([place]) 03/18/2016  . Acute on chronic diastolic heart failure ([place]) 03/18/2016  . Atrial fibrillation with RVR ([place]) 03/18/2016  . Fatigue 01/17/2016  . DOE (dyspnea on exertion) 01/17/2016  . Coronary artery disease involving native coronary artery  of native heart with angina pectoris ([place])   . Critical aortic stenosis 10/12/2015    Past Surgical History:  Procedure Laterality Date  . ABDOMINAL SURGERY  1981   Meckles Diverticulum with volvulus  . APPENDECTOMY    . CARDIAC CATHETERIZATION N/A 01/17/2016   Procedure: Right/Left Heart Cath and Coronary Angiography;  Surgeon: Belva Crome, MD;  Location: [place] [facility];  Service: Cardiovascular;  Laterality: N/A;  . CAROTID ENDARTERECTOMY     bilateral  . HERNIA REPAIR Right 2009       Home Medications    Prior to Admission medications   Medication Sig Start Date End Date Taking? Authorizing Provider  amLODipine (NORVASC) 10 MG tablet Take 10 mg by mouth daily.    Historical Provider, MD  aspirin 81 MG tablet Take 81 mg by mouth daily.    Historical Provider, MD  cholecalciferol (VITAMIN D) 1000 units tablet Take 1,000 Units by mouth daily.    Historical Provider, MD  Coenzyme Q10 (COQ-10 PO) Take 1 capsule by mouth daily.    Historical Provider, MD  latanoprost (XALATAN) 0.005 % ophthalmic solution Place 1 drop into both eyes at bedtime.    Historical Provider, MD  lisinopril (PRINIVIL,ZESTRIL) 20 MG tablet Take 20 mg by mouth daily.    Historical Provider, MD  omeprazole (PRILOSEC) 20 MG capsule Take 20 mg by mouth daily.    Historical Provider, MD  Probiotic Product (PROBIOTIC PO) Take 1 tablet by  mouth daily.    Historical Provider, MD    Family History Family History  Problem Relation Age of Onset  . Heart disease Mother   . Heart disease Father     Social History Social History  Substance Use Topics  . Smoking status: Never Smoker  . Smokeless tobacco: Never Used  . Alcohol use No     Allergies   Hydrochlorothiazide; Ace inhibitors; Ciprofloxacin; Losartan; and Sulfa antibiotics   Review of Systems Review of Systems  Constitutional: Positive for fatigue. Negative for fever.  HENT: Negative for sore throat.   Eyes: Negative for redness.    Respiratory: Positive for shortness of breath.   Cardiovascular: Positive for chest pain and palpitations. Negative for leg swelling.  Gastrointestinal: Negative for abdominal pain.  Genitourinary: Negative for flank pain.  Musculoskeletal: Negative for back pain and neck pain.  Skin: Negative for rash.  Neurological: Negative for headaches.  Hematological: Does not bruise/bleed easily.  Psychiatric/Behavioral: Negative for confusion.     Physical Exam Updated Vital Signs BP 111/88   Pulse 60   Resp 16   Ht 5\' 7"  (1.702 m)   Wt 59 kg   SpO2 96%   BMI 20.36 kg/m   Physical Exam  Constitutional: He appears well-developed and well-nourished.  Mildly dyspneic.   HENT:  Mouth/Throat: Oropharynx is clear and moist.  Eyes: Conjunctivae are normal.  Neck: Neck supple. No tracheal deviation present. No thyromegaly present.  Cardiovascular: Intact distal pulses.   Tachycardic, irregular.   Pulmonary/Chest: Breath sounds normal. No accessory muscle usage. No respiratory distress.  Abdominal: Soft. He exhibits no distension. There is no tenderness.  Musculoskeletal: He exhibits no edema.  Neurological: He is alert.  Skin: Skin is warm and dry. He is not diaphoretic.  Psychiatric: He has a normal mood and affect.  Nursing note and vitals reviewed.    ED Treatments / Results  Labs (all labs ordered are listed, but only abnormal results are displayed) Results for orders placed or performed during the hospital encounter of 75/10/25  Basic metabolic panel  Result Value Ref Range   Sodium 140 135 - 145 mmol/L   Potassium 4.0 3.5 - 5.1 mmol/L   Chloride 106 101 - 111 mmol/L   CO2 25 22 - 32 mmol/L   Glucose, Bld 106 (H) 65 - 99 mg/dL   BUN 19 6 - 20 mg/dL   Creatinine, Ser 1.18 0.61 - 1.24 mg/dL   Calcium 9.7 8.9 - 10.3 mg/dL   GFR calc non Af Amer 55 (L) >60 mL/min   GFR calc Af Amer >60 >60 mL/min   Anion gap 9 5 - 15  CBC  Result Value Ref Range   WBC 5.1 4.0 - 10.5  K/uL   RBC 5.21 4.22 - 5.81 MIL/uL   Hemoglobin 16.0 13.0 - 17.0 g/dL   HCT 47.4 39.0 - 52.0 %   MCV 91.0 78.0 - 100.0 fL   MCH 30.7 26.0 - 34.0 pg   MCHC 33.8 30.0 - 36.0 g/dL   RDW 13.0 11.5 - 15.5 %   Platelets 221 150 - 400 K/uL  I-stat troponin, ED (not at [facility])  Result Value Ref Range   Troponin i, poc 0.08 0.00 - 0.08 ng/mL   Comment 3           EKG  EKG Interpretation  Date/Time:  Tuesday March 18 2016 10:49:26 EDT Ventricular Rate:  141 PR Interval:    QRS Duration: 117 QT Interval:  310  QTC Calculation: 475 R Axis:   103 Text Interpretation:  Atrial fibrillation with rapid V-rate Nonspecific intraventricular conduction delay Confirmed by Ashok Cordia  MD, Lennette Bihari ([id number]) on 03/18/2016 11:21:50 AM       Radiology Dg Chest Port 1 View  Result Date: 03/18/2016 CLINICAL DATA:  Episode of palpitations, chest pressure and fatigue at doctor's office today, worsening symptoms, history hypertension, prostate cancer, coronary artery disease, carotid artery disease, bladder cancer EXAM: PORTABLE CHEST 1 VIEW COMPARISON:  Portable exam 1150 hours without priors for comparison FINDINGS: Upper normal heart size. Calcified and elongated thoracic aorta. Mediastinal contours and pulmonary vascularity normal. Lungs clear. No pleural effusion or pneumothorax. Bones demineralized. IMPRESSION: No acute abnormalities. Aortic atherosclerosis. Electronically Signed   By: Lavonia Dana M.D.   On: 03/18/2016 12:09    Procedures Procedures (including critical care time)  Medications Ordered in ED Medications  diltiazem (CARDIZEM) 1 mg/mL load via infusion 20 mg (not administered)    And  diltiazem (CARDIZEM) 100 mg in dextrose 5% 146mL (1 mg/mL) infusion (5 mg/hr Intravenous New Bag/Given 03/18/16 1125)  amiodarone (NEXTERONE) 1.8 mg/mL load via infusion 150 mg (not administered)    Followed by  amiodarone (NEXTERONE PREMIX) 360-4.14 MG/200ML-% (1.8 mg/mL) IV infusion (not administered)     Followed by  amiodarone (NEXTERONE PREMIX) 360-4.14 MG/200ML-% (1.8 mg/mL) IV infusion (not administered)     Initial Impression / Assessment and Plan / ED Course  I have reviewed the triage vital signs and the nursing notes.  Pertinent labs & imaging results that were available during my care of the patient were reviewed by me and considered in my medical decision making (see chart for details).  Clinical Course    Iv ns. Continuous pulse ox and monitor. o2 [place]. Labs.  Asa.   cardizem iv bolus and gtt.   Reviewed nursing notes and prior charts for additional history.   Cardiology consulted - they will see in ED.    Rn indicates also has order to add amiodarone to current tx.   Recheck, no chest pain. Hr 70. Repeat ecg ordered.     Final Clinical Impressions(s) / ED Diagnoses   Final diagnoses:  None    New Prescriptions New Prescriptions   No medications on file     Lajean Saver, MD 03/18/16 1228

## 2016-03-18 NOTE — Progress Notes (Signed)
The patient was admitted to the hospital due to atrial fibrillation with RVR.

## 2016-03-18 NOTE — Progress Notes (Signed)
ANTICOAGULATION CONSULT NOTE - Initial Consult  Pharmacy Consult for heparin Indication: atrial fibrillation  Allergies  Allergen Reactions  . Hydrochlorothiazide Anaphylaxis  . Ace Inhibitors Cough  . Ciprofloxacin   . Losartan Cough  . Sulfa Antibiotics     Patient Measurements: Height: 5\' 7"  (170.2 cm) Weight: 130 lb (59 kg) IBW/kg (Calculated) : 66.1 Heparin Dosing Weight: 59kg  Vital Signs: BP: 124/73 (10/17 1230) Pulse Rate: 64 (10/17 1230)  Labs:  Recent Labs  03/18/16 1107 03/18/16 1222  HGB 16.0 15.7  HCT 47.4 45.6  PLT 221 212  CREATININE 1.18  --     Estimated Creatinine Clearance: 39.6 mL/min (by C-G formula based on SCr of 1.18 mg/dL).   Medical History: Past Medical History:  Diagnosis Date  . Age-related macular degeneration   . Allergic rhinitis   . Aortic valve stenosis   . Carotid artery obstruction   . Chronic lower back pain   . Coronary arteriosclerosis in native artery   . Diverticulitis of colon   . DJD (degenerative joint disease)   . Dysthymia   . History of elevated PSA 05/2009  . HLD (hyperlipidemia)   . Hypertension   . Nocturnal hypoxemia   . OSA (obstructive sleep apnea)    intolerant to CPAP  . Paroxysmal supraventricular tachycardia ([place])   . Primary malignant neoplasm of bladder ([place])   . Prostate cancer ([place])     Medications:  Infusions:  . sodium chloride    . amiodarone     Followed by  . amiodarone    . diltiazem (CARDIZEM) infusion 5 mg/hr (03/18/16 1125)  . heparin      Assessment: 9 yom presented to the ED with afib with RVR. To start IV heparin today and transition to apixaban tomorrow. Baseline CBC is WNL. He is not on anticoagulation PTA.   Goal of Therapy:  Heparin level 0.3-0.7 units/ml Monitor platelets by anticoagulation protocol: Yes   Plan:  - Heparin bolus 3000 units IV x 1 - Heparin gtt 850 units/hr - Check an 8 hr heparin level - Heparin level and CBC in AM *Heparin off tomorrow at  10am when apixaban starts  [place] Grayer, Rande Lawman 03/18/2016,12:42 PM

## 2016-03-18 NOTE — Progress Notes (Addendum)
     Seen in ER with wife and Dr. Loletha Carrow (GI doctor) and discussed case. He converted to NSR and is feeling much better. He really wanted to go home but will stay for observation and to get his cardiac CT, CT angio, PFTs and carotid dopplers (which will be arranged by Thurmond Butts). I will stop IV amio and start amio 400mg  BID and diltiazem 30mg  TID. Will continue IV heparin until tomorrow AM when they start Eliquis 2.5mg  at 10am.   Angelena Form PA-C  [facility]

## 2016-03-18 NOTE — ED Triage Notes (Signed)
Pt in from doc's office via [facility] with new onset Afib. Pt was at f/u cardiologist (Dr. Tamala Julian) visit today, placed on EKG monitor and Afib at rate between 108-187 noted. New for pt, hx of cath 1 mo ago, unable to place stents d/t blockage. Pt c/o sob with exertion x 2 wks. A&ox4

## 2016-03-18 NOTE — ED Notes (Signed)
Patient undressed, in gown, on monitor, continuous pulse oximetry and blood pressure cuff 

## 2016-03-18 NOTE — ED Notes (Signed)
Pt converted to NSR

## 2016-03-18 NOTE — Progress Notes (Signed)
*  PRELIMINARY RESULTS* Vascular Ultrasound Carotid Duplex (Doppler) has been completed.  Preliminary findings: Right 1-39% ICA stenosis. Left 40-59% ICA stenosis. Antegrade vertebral flow.   Landry Mellow, RDMS, RVT  03/18/2016, 4:08 PM

## 2016-03-18 NOTE — H&P (Addendum)
Ryan [name] is a 82 y.o. male  Admit Date: 03/18/2016   Referring Physician: Ena Dawley, MD/ Darylene Price, MD Primary Cardiologist:: Ena Dawley Chief complaint / reason for admission: Atrial fibrillation with rapid ventricular response  HPI: Very pleasant 82 year old gentleman with severe aortic stenosis and severe diffuse calcified 3 vessel coronary disease who presents for office evaluation to be considered for percutaneous intervention on the right coronary in preparation forTAVR.  For 2-3 months the patient has experienced recurring episodes of sudden and severe chest tightness/burning, dyspnea, and excessive fatigue. Upon arriving in the office this morning he states that an episode occurred in preparation for the visit. His pulse rate was noted to be greater than 150 and irregularly irregular. His current complaints of dyspnea, weakness, anxiety, and chest pressure are exactly what he has been experiencing over the last several months. EKG in the office confirmed atrial fibrillation with RVR and left bundle branch block. With ongoing chest discomfort he is being admitted to the hospital for control of atrial fibrillation, eventual cardioversion (hopefully pharmacologic) and initiation of anticoagulation therapy. These episodes have occurred with and without activity. They last between 5 and 40 minutes since they began occurring 3 months ago.    PMH:    Past Medical History:  Diagnosis Date  . Age-related macular degeneration   . Allergic rhinitis   . Aortic valve stenosis   . Carotid artery obstruction   . Chronic lower back pain   . Coronary arteriosclerosis in native artery   . Diverticulitis of colon   . DJD (degenerative joint disease)   . Dysthymia   . History of elevated PSA 05/2009  . HLD (hyperlipidemia)   . Hypertension   . Nocturnal hypoxemia   . OSA (obstructive sleep apnea)    intolerant to CPAP  . Paroxysmal supraventricular tachycardia ([place])     . Primary malignant neoplasm of bladder ([place])   . Prostate cancer ([place])     PSH:    Past Surgical History:  Procedure Laterality Date  . ABDOMINAL SURGERY  1981   Meckles Diverticulum with volvulus  . APPENDECTOMY    . CARDIAC CATHETERIZATION N/A 01/17/2016   Procedure: Right/Left Heart Cath and Coronary Angiography;  Surgeon: Belva Crome, MD;  Location: [facility];  Service: Cardiovascular;  Laterality: N/A;  . CAROTID ENDARTERECTOMY     bilateral  . HERNIA REPAIR Right 2009   ALLERGIES:   Hydrochlorothiazide; Ace inhibitors; Ciprofloxacin; Losartan; and Sulfa antibiotics Prior to Admit Meds:   (Not in a hospital admission) Family HX:    Family History  Problem Relation Age of Onset  . Heart disease Mother   . Heart disease Father    Social HX:    Social History   Social History  . Marital status: Married    Spouse name: N/A  . Number of children: N/A  . Years of education: N/A   Occupational History  . retired    Social History Main Topics  . Smoking status: Never Smoker  . Smokeless tobacco: Never Used  . Alcohol use No  . Drug use: No  . Sexual activity: Not on file   Other Topics Concern  . Not on file   Social History Narrative  . No narrative on file     ROS Multiple questions concerning the possibility of TAVR.  Is having additional imaging performed for the procedure and is concerned that he will miss those appointments today. He denies apnea and PND. All  other systems are negative.  Physical Exam: Blood pressure 111/88, pulse 60, resp. rate 16, height 5\' 7"  (1.702 m), weight 130 lb (59 kg), SpO2 96 %.   Slender pleasant gentleman accompanied by his wife. Pallor is noted. The patient appears apprehensive. Hands are cool.  HEENT exam reveals no jaundice. Extraocular movements are full. Neck exam reveals transmitted bilateral carotid bruits. Carotid upstroke and volume are diminished in part due to rapid irregular rhythm. Chest reveals  basilar crackles bilaterally. Cardiac exam reveals a rapid irregularly irregular rhythm with a 3 to 4/6 crescendo decrescendo systolic murmur of aortic stenosis. No diastolic murmurs appreciated. Abdomen is soft. Bowel sounds are normal. Extremities reveal no edema. Radial and pedal pulses are thready and 2+. Neurological exam is intact.   Labs: Lab Results  Component Value Date   WBC 4.6 01/14/2016   HGB 15.7 01/14/2016   HCT 47.9 01/14/2016   MCV 93.7 01/14/2016   PLT 252 01/14/2016    Recent Labs Lab 03/14/16 0945  NA 140  K 4.1  CL 103  CO2 28  BUN 20  CREATININE 1.09  CALCIUM 9.3  PROT 6.3  BILITOT 0.4  ALKPHOS 67  ALT 20  AST 25  GLUCOSE 85   No results found for: CKTOTAL, CKMB, CKMBINDEX, TROPONINI    Radiology:  No results found.  EKG:  Atrial fibrillation with rapid ventricular response, left bundle branch block, but no acute ST-T wave change. Compared to prior tracings, atrial fibrillation is new.   ASSESSMENT:  1. Paroxysmal atrial fibrillation with rapid ventricular response, new diagnosis. CHADS-VASC score 4. 2. Severe aortic stenosis documented by cath and noninvasive studies with valve area of 1 cm. 3. Severe calcific 3 vessel coronary disease. The patient was being seen in the office today to consider intervention on focal stenosis in the mid right coronary as a prelude to TAVR. 4. Acute on chronic diastolic heart failure precipitated by atrial fibrillation with RVR.   Plan:  1. The patient is being admitted to the hospital for management of atrial fibrillation. In the ER he will receive IV diltiazem, being careful not to drop his blood pressure. Additionally IV amiodarone will be started to hopefully convert the patient to normal sinus rhythm. He does have underlying conduction system disease so we have to be careful not to induce high-grade AV block/significant bradycardia. Will need to be watched very closely. It is now clear that the recent  acceleration in symptoms have been related to paroxysmal atrial fibrillation episodes. 2. Ongoing process of being evaluated for TAVR vs SAVR. After reviewing the coronary angiogram today and with the development of atrial fibrillation, SAVR with CABG and Maze should be considered. Other option is PCI+TAVR+Triple drug therapy for AF. Will speak with Dr. Roxy Manns and Dr. Cyndia Bent.Plan to complete staging imaging which was planned for today until hospitalized for AF. 3. Coronary angiography was reviewed with the patient. We discussed intervention on the right coronary which will be at higher risk due to heavy calcification, the requirement for temporary pacing, rotational atherectomy and the possibility that the stent may not be deliverable due to proximal tortuosity. Also has severe ostial disease in the circumflex/first obtuse marginal. PCI may need to be considered on this vessel as well but will jail the diffusely diseased, small ongoing circumflex beyond the obtuse marginal #1. The LAD has calcified diffuse CAD of intermediate stenosis. Will review further with interventional colleagues. 4. Rate control followed by cardioversion either chemically or electrically will help to compensate this  problem. May need diuresis.   [name] 03/18/2016 11:24 AM

## 2016-03-18 NOTE — Progress Notes (Signed)
[place] for heparin Indication: atrial fibrillation  Allergies  Allergen Reactions  . Hydrochlorothiazide Anaphylaxis  . Ace Inhibitors Cough  . Ciprofloxacin   . Losartan Cough  . Sulfa Antibiotics     Patient Measurements: Height: 5\' 7"  (170.2 cm) Weight: 131 lb 6.3 oz (59.6 kg) IBW/kg (Calculated) : 66.1 Heparin Dosing Weight: 59kg  Vital Signs: Temp: 97.8 F (36.6 C) (10/17 2035) Temp Source: Oral (10/17 2035) BP: 107/66 (10/17 2035) Pulse Rate: 51 (10/17 2035)  Labs:  Recent Labs  03/18/16 1107 03/18/16 1222 03/18/16 1958  HGB 16.0 15.7  --   HCT 47.4 45.6  --   PLT 221 212  --   HEPARINUNFRC  --   --  0.46  CREATININE 1.18 1.12  --   TROPONINI  --  <0.03 0.03*    Estimated Creatinine Clearance: 42.1 mL/min (by C-G formula based on SCr of 1.12 mg/dL).   Medical History: Past Medical History:  Diagnosis Date  . Age-related macular degeneration   . Allergic rhinitis   . Aortic valve stenosis   . Carotid artery obstruction   . Chronic lower back pain   . Coronary arteriosclerosis in native artery   . Diverticulitis of colon   . DJD (degenerative joint disease)   . Dysthymia   . History of elevated PSA 05/2009  . HLD (hyperlipidemia)   . Hypertension   . Nocturnal hypoxemia   . OSA (obstructive sleep apnea)    intolerant to CPAP  . Paroxysmal supraventricular tachycardia ([place])   . Primary malignant neoplasm of bladder ([place])   . Prostate cancer ([place])     Medications:  Infusions:  . diltiazem (CARDIZEM) infusion    . heparin 850 Units/hr (03/18/16 1600)    Assessment: 53 yom presented to the ED with afib with RVR. Pharmacy dosing heparin and initial heparin level is at goal   Goal of Therapy:  Heparin level 0.3-0.7 units/ml Monitor platelets by anticoagulation protocol: Yes   Plan:  -No heparin changes -Daily heparin level and CBC -Transition to [place] on 10/18  Hildred Laser, Pharm D  03/18/2016 10:01 PM

## 2016-03-19 ENCOUNTER — Observation Stay (HOSPITAL_COMMUNITY): Payer: Medicare Other

## 2016-03-19 ENCOUNTER — Encounter (HOSPITAL_COMMUNITY): Payer: Self-pay | Admitting: Radiology

## 2016-03-19 DIAGNOSIS — I5033 Acute on chronic diastolic (congestive) heart failure: Secondary | ICD-10-CM | POA: Diagnosis not present

## 2016-03-19 DIAGNOSIS — I350 Nonrheumatic aortic (valve) stenosis: Secondary | ICD-10-CM

## 2016-03-19 DIAGNOSIS — I745 Embolism and thrombosis of iliac artery: Secondary | ICD-10-CM | POA: Diagnosis not present

## 2016-03-19 DIAGNOSIS — E7800 Pure hypercholesterolemia, unspecified: Secondary | ICD-10-CM

## 2016-03-19 DIAGNOSIS — I471 Supraventricular tachycardia: Secondary | ICD-10-CM | POA: Diagnosis not present

## 2016-03-19 DIAGNOSIS — I480 Paroxysmal atrial fibrillation: Principal | ICD-10-CM

## 2016-03-19 DIAGNOSIS — I110 Hypertensive heart disease with heart failure: Secondary | ICD-10-CM | POA: Diagnosis not present

## 2016-03-19 DIAGNOSIS — I495 Sick sinus syndrome: Secondary | ICD-10-CM | POA: Diagnosis not present

## 2016-03-19 DIAGNOSIS — I248 Other forms of acute ischemic heart disease: Secondary | ICD-10-CM | POA: Diagnosis not present

## 2016-03-19 DIAGNOSIS — I447 Left bundle-branch block, unspecified: Secondary | ICD-10-CM | POA: Diagnosis not present

## 2016-03-19 DIAGNOSIS — I6523 Occlusion and stenosis of bilateral carotid arteries: Secondary | ICD-10-CM | POA: Diagnosis not present

## 2016-03-19 DIAGNOSIS — I739 Peripheral vascular disease, unspecified: Secondary | ICD-10-CM | POA: Diagnosis not present

## 2016-03-19 DIAGNOSIS — I060 Rheumatic aortic stenosis: Secondary | ICD-10-CM | POA: Diagnosis not present

## 2016-03-19 LAB — PROTIME-INR
INR: 1.11
Prothrombin Time: 14.4 seconds (ref 11.4–15.2)

## 2016-03-19 LAB — VAS US CAROTID
LEFT ECA DIAS: 12 cm/s
LEFT VERTEBRAL DIAS: 8 cm/s
LICAPDIAS: -46 cm/s
Left CCA dist dias: -23 cm/s
Left CCA dist sys: -81 cm/s
Left CCA prox dias: 17 cm/s
Left CCA prox sys: 99 cm/s
Left ICA dist dias: -20 cm/s
Left ICA dist sys: -92 cm/s
Left ICA prox sys: -155 cm/s
RCCADSYS: -84 cm/s
RIGHT ECA DIAS: 13 cm/s
RIGHT VERTEBRAL DIAS: 11 cm/s
Right CCA prox dias: 12 cm/s
Right CCA prox sys: 80 cm/s

## 2016-03-19 LAB — PULMONARY FUNCTION TEST
DL/VA % pred: 71 %
DL/VA: 3.14 ml/min/mmHg/L
DLCO COR % PRED: 57 %
DLCO cor: 16.19 ml/min/mmHg
DLCO unc % pred: 58 %
DLCO unc: 16.67 ml/min/mmHg
FEF 25-75 PRE: 1.66 L/s
FEF2575-%Pred-Pre: 109 %
FEV1-%PRED-PRE: 106 %
FEV1-PRE: 2.5 L
FEV1FVC-%Pred-Pre: 100 %
FEV6-%Pred-Pre: 109 %
FEV6-Pre: 3.41 L
FEV6FVC-%PRED-PRE: 105 %
FVC-%Pred-Pre: 103 %
FVC-Pre: 3.5 L
Pre FEV1/FVC ratio: 71 %
Pre FEV6/FVC Ratio: 97 %
RV % PRED: 98 %
RV: 2.54 L
TLC % pred: 95 %
TLC: 6.19 L

## 2016-03-19 LAB — HEMOGLOBIN A1C
Hgb A1c MFr Bld: 5.6 % (ref 4.8–5.6)
Hgb A1c MFr Bld: 5.9 % — ABNORMAL HIGH (ref 4.8–5.6)
Mean Plasma Glucose: 114 mg/dL
Mean Plasma Glucose: 123 mg/dL

## 2016-03-19 LAB — HEPARIN LEVEL (UNFRACTIONATED): HEPARIN UNFRACTIONATED: 0.52 [IU]/mL (ref 0.30–0.70)

## 2016-03-19 MED ORDER — AMIODARONE HCL IN DEXTROSE 360-4.14 MG/200ML-% IV SOLN
60.0000 mg/h | INTRAVENOUS | Status: DC
  Filled 2016-03-19: qty 200

## 2016-03-19 MED ORDER — AMIODARONE HCL IN DEXTROSE 360-4.14 MG/200ML-% IV SOLN
30.0000 mg/h | INTRAVENOUS | Status: DC
  Administered 2016-03-19: 30 mg/h via INTRAVENOUS

## 2016-03-19 MED ORDER — AMIODARONE HCL IN DEXTROSE 360-4.14 MG/200ML-% IV SOLN
INTRAVENOUS | Status: AC
  Administered 2016-03-19: 33.3 mg/h
  Filled 2016-03-19: qty 200

## 2016-03-19 MED ORDER — METOPROLOL TARTRATE 12.5 MG HALF TABLET
12.5000 mg | ORAL_TABLET | Freq: Two times a day (BID) | ORAL | Status: DC
  Filled 2016-03-19: qty 1

## 2016-03-19 MED ORDER — IOPAMIDOL (ISOVUE-370) INJECTION 76%
INTRAVENOUS | Status: AC
  Administered 2016-03-19: 100 mL
  Filled 2016-03-19: qty 100

## 2016-03-19 MED ORDER — METOPROLOL TARTRATE 25 MG PO TABS: 25.0000 mg | ORAL_TABLET | Freq: Once | ORAL | Status: DC

## 2016-03-19 MED ORDER — AMIODARONE HCL 200 MG PO TABS
400.0000 mg | ORAL_TABLET | Freq: Two times a day (BID) | ORAL | Status: DC
Start: 2016-03-19 — End: 2016-03-22
  Administered 2016-03-19 – 2016-03-22 (×6): 400 mg via ORAL
  Filled 2016-03-19 (×6): qty 2

## 2016-03-19 MED ORDER — AMIODARONE IV BOLUS ONLY 150 MG/100ML
150.0000 mg | Freq: Once | INTRAVENOUS | Status: AC
Start: 2016-03-19 — End: 2016-03-19
  Administered 2016-03-19: 150 mg via INTRAVENOUS

## 2016-03-19 MED ORDER — METOPROLOL TARTRATE 5 MG/5ML IV SOLN
INTRAVENOUS | Status: AC
  Filled 2016-03-19: qty 15

## 2016-03-19 MED ORDER — METOPROLOL TARTRATE 5 MG/5ML IV SOLN
5.0000 mg | INTRAVENOUS | Status: DC | PRN
  Administered 2016-03-19: 5 mg via INTRAVENOUS

## 2016-03-19 MED ORDER — AMIODARONE HCL 200 MG PO TABS: 400.0000 mg | ORAL_TABLET | Freq: Two times a day (BID) | ORAL | Status: DC

## 2016-03-19 MED ORDER — IOPAMIDOL (ISOVUE-370) INJECTION 76%
INTRAVENOUS | Status: AC
  Administered 2016-03-19: 50 mL
  Filled 2016-03-19: qty 50

## 2016-03-19 NOTE — Progress Notes (Signed)
Patient brought to pulmonary lab for pfts.  Did not administer the albuterol treatment for the post bronchodilator study due to recent onset of rapid a-fib.  HR fluctuated between 72-112 during the test.

## 2016-03-19 NOTE — Progress Notes (Signed)
Patient Name: Ryan [name] Date of Encounter: 03/19/2016  Primary Cardiologist: Ena Dawley, MD  Hospital Problem List     Principal Problem:   PAF (paroxysmal atrial fibrillation) [facility]) Active Problems:   Acute on chronic diastolic heart failure (HCC)   Atrial fibrillation with RVR (HCC)   Atrial fibrillation with rapid ventricular response (HCC)    Subjective   Feeling a little short of breath.  Denies chest pain or edema.    Inpatient Medications    Scheduled Meds: . amiodarone  400 mg Oral BID  . apixaban  2.5 mg Oral BID  . aspirin EC  81 mg Oral Daily  . cholecalciferol  1,000 Units Oral Daily  . diltiazem  10 mg Intravenous Once  . latanoprost  1 drop Both Eyes QHS  . lisinopril  20 mg Oral Daily  . pantoprazole  40 mg Oral Daily  . sodium chloride flush  3 mL Intravenous Q12H   Continuous Infusions: . diltiazem (CARDIZEM) infusion     PRN Meds: sodium chloride, acetaminophen, ondansetron (ZOFRAN) IV, sodium chloride flush   Vital Signs    Vitals:   03/18/16 2035 03/19/16 0011 03/19/16 0410 03/19/16 1000  BP: 107/66 117/61 125/62 (!) 143/67  Pulse: (!) 51 (!) 47 (!) 51   Resp: 14 16 12    Temp: 97.8 F (36.6 C) 97.5 F (36.4 C) 97.4 F (36.3 C)   TempSrc: Oral Oral Oral   SpO2: 94% 96% 93%   Weight:   60.5 kg (133 lb 4.8 oz)   Height:        Intake/Output Summary (Last 24 hours) at 03/19/16 1111 Last data filed at 03/19/16 0753  Gross per 24 hour  Intake              582 ml  Output              900 ml  Net             -318 ml   Filed Weights   03/18/16 1054 03/18/16 1508 03/19/16 0410  Weight: 59 kg (130 lb) 59.6 kg (131 lb 6.3 oz) 60.5 kg (133 lb 4.8 oz)    Physical Exam    GEN: Well nourished, well developed, in no acute distress.  HEENT: Grossly normal.  Neck: Supple, no JVD, carotid bruits, or masses. Cardiac: Irregularly irregular. , III/VI late-peaking systolic murmur at LUSB, no rubs, or gallops. No clubbing, cyanosis,  edema.  Radials/DP/PT 2+ and equal bilaterally.  Respiratory:  Respirations regular and unlabored, clear to auscultation bilaterally. GI: Soft, nontender, nondistended, BS + x 4. MS: no deformity or atrophy. Skin: warm and dry, no rash. Neuro:  Strength and sensation are intact. Psych: AAOx3.  Normal affect.  Labs    CBC  Recent Labs  03/18/16 1107 03/18/16 1222  WBC 5.1 4.9  NEUTROABS  --  3.2  HGB 16.0 15.7  HCT 47.4 45.6  MCV 91.0 90.5  PLT 221 253   Basic Metabolic Panel  Recent Labs  03/18/16 1107 03/18/16 1222  NA 140 140  K 4.0 3.9  CL 106 109  CO2 25 21*  GLUCOSE 106* 93  BUN 19 20  CREATININE 1.18 1.12  CALCIUM 9.7 9.5   Liver Function Tests  Recent Labs  03/18/16 1222  AST 26  ALT 19  ALKPHOS 64  BILITOT 0.6  PROT 6.4*  ALBUMIN 4.0   No results for input(s): LIPASE, AMYLASE in the last 72 hours. Cardiac Enzymes  Recent  Labs  03/18/16 1222 03/18/16 1958  TROPONINI <0.03 0.03*   BNP Invalid input(s): POCBNP D-Dimer No results for input(s): DDIMER in the last 72 hours. Hemoglobin A1C  Recent Labs  03/18/16 1958  HGBA1C 5.9*   Fasting Lipid Panel No results for input(s): CHOL, HDL, LDLCALC, TRIG, CHOLHDL, LDLDIRECT in the last 72 hours. Thyroid Function Tests  Recent Labs  03/18/16 1958  TSH 2.194    Telemetry    Sinus rhythm intermittently with atrial fibrillation with RVR.  Currently in atrial fibrillation.   - Personally Reviewed  ECG    03/19/16: Atrial fibrillation.  Rate 126.  LBBB  - Personally Reviewed  Radiology    Dg Chest Port 1 View  Result Date: 03/18/2016 CLINICAL DATA:  Episode of palpitations, chest pressure and fatigue at doctor's office today, worsening symptoms, history hypertension, prostate cancer, coronary artery disease, carotid artery disease, bladder cancer EXAM: PORTABLE CHEST 1 VIEW COMPARISON:  Portable exam 1150 hours without priors for comparison FINDINGS: Upper normal heart size.  Calcified and elongated thoracic aorta. Mediastinal contours and pulmonary vascularity normal. Lungs clear. No pleural effusion or pneumothorax. Bones demineralized. IMPRESSION: No acute abnormalities. Aortic atherosclerosis. Electronically Signed   By: Lavonia Dana M.D.   On: 03/18/2016 12:09    Cardiac Studies   LHC 01/17/16:  Prox RCA to Mid RCA lesion, 20 %stenosed.  Mid RCA lesion, 80 %stenosed.  Ost 1st Mrg to 1st Mrg lesion, 80 %stenosed.  Mid Cx to Dist Cx lesion, 70 %stenosed.  Prox LAD to Dist LAD lesion, 40 %stenosed.  The left ventricular systolic function is normal.  LV end diastolic pressure is normal.  The left ventricular ejection fraction is 55-65% by visual estimate.  LV end diastolic pressure is normal.    There is three-vessel coronary calcification.  80% eccentric mid RCA stenosis.  80% ostial obtuse marginal #1 stenosis.  Moderate diffuse calcified LAD disease less than 50%.  Normal left ventricular systolic function and hemodynamics  Normal pulmonary pressures and pulmonary capillary wedge.  Moderate to severe aortic stenosis with aortic valve area 1.04.  Echo 10/10/15: Study Conclusions  - Left ventricle: The cavity size was normal. Wall thickness was   increased in a pattern of mild LVH. Systolic function was normal.   The estimated ejection fraction was in the range of 60% to 65%.   Wall motion was normal; there were no regional wall motion   abnormalities. Doppler parameters are consistent with abnormal   left ventricular relaxation (grade 1 diastolic dysfunction).   Doppler parameters are consistent with indeterminate ventricular   filling pressure. - Aortic valve: Valve mobility was restricted. There was moderate   stenosis. Peak velocity (S): 387.9 cm/s. Mean gradient (S): 25 mm   Hg. Valve area (VTI): 1.04 cm^2. Valve area (Vmax): 1 cm^2. Valve   area (Vmean): 1.22 cm^2. - Mitral valve: There was trivial regurgitation. - Left  atrium: The atrium was mildly dilated. - Right ventricle: The cavity size was normal. Wall thickness was   normal. Systolic function was normal. - Atrial septum: No defect or patent foramen ovale was identified   by color flow Doppler. - Tricuspid valve: There was mild regurgitation. - Inferior vena cava: The vessel was normal in size. The   respirophasic diameter changes were in the normal range (= 50%),   consistent with normal central venous pressure.   Patient Profile     Mr. Walrath is an 59M with severe aortic stenosis, diffuse three vessel CAD, chronic diastolic heart  failure and hypertensive heart disease noted to have atrial fibrillation with RVR when evaluated in the office for critical aortic stenosis and obstructive coronary disease.  Assessment & Plan    # Paroxysmal atrial fibrillation:  Mr. Ng is back in atrial fibrillation this morning and rates are poorly-controlled.  We will stop the oral amiodarone and resume the amiodarone with a bolus and infusion.  He has a history of conduction disease, so we will start low dose metoprolol and monitor closely.  Metoprolol 12.5 mg q12h.  He was started on Eliquis today.  # Moderate to severe aortic stenosis:  Mean gradient 25 mmHg by echo 10/2015.  Valve area 1.04 cm^2 by cath 01/2016.  Currently being evaluated for SAVR vs. TAVR.  # CAD:  Mr. Haskew has 80% lesions in the mid RCA and OM1 that are technically challenging to open via PCI, though not impossible.  He is undergoing evaluation for CABG vs  PCI as an outpatient.  Continue aspirin and start metoprolol as above.  He had memory impairment with statins in the past.  Will check fasting lipids.  Consider PCSK9 as an outpatient.  Avoid nitrates given AS.  # Hypertensive heart disease: Blood pressure is well-controlled on lisinopril and diltiazem.    Signed, Skeet Latch, MD  03/19/2016, 11:11 AM

## 2016-03-19 NOTE — Progress Notes (Signed)
Dr. Oval Linsey said that paient can go down for his CT angio test with out a RN while on Amino gtt.

## 2016-03-19 NOTE — Care Management Obs Status (Signed)
[facility] NOTIFICATION   Patient Details  Name: Ryan [name] MRN: [id number] Date of Birth: [birth year]   Medicare Observation Status Notification Given:  Yes    Bethena Roys, RN 03/19/2016, 12:16 PM

## 2016-03-19 NOTE — Progress Notes (Signed)
Telemetry reviewed, patient was in sinus bradycardia with HR 30-40s, now in junctional rhythm with HR 40s without clear p wave. His amiodarone gtt cut back, still in junctional rhythm, will transition PO amiodarone. If continue to have sinus brady, may need to consider tikosyn. His h/o CAD preclude him from class Ic drug, sotolol may cause worsening bradycardia.   Hilbert Corrigan PA Pager: [phone]

## 2016-03-19 NOTE — Progress Notes (Signed)
PT Cancellation Note  Patient Details Name: Ryan [name] MRN: [id number] DOB: Jun 13, 1929   Cancelled Treatment:    Reason Eval/Treat Not Completed: Patient at procedure or test/unavailable.  Per RN, pt just left to go to several tests that will likely take a few hours.  PT will check back, but she reports he will likely not be back before 1700. PT will attempt TAVR exam again in AM.  Thanks,    Wells Guiles B. Ygnacio Fecteau, PT, DPT [phone]   03/19/2016, 3:18 PM

## 2016-03-19 NOTE — Progress Notes (Signed)
[place] for heparin Indication: atrial fibrillation   Assessment: 62 yom presented to the ED with afib with RVR. Pharmacy dosing heparin. Level at goal this morning and patient has now been transitioned to low dose apixaban.   Low dose appropriate given age>82 and wt of 60kg, scr ok at 1.1. CBC within normal limits.  Goal of Therapy:  Heparin level 0.3-0.7 units/ml Monitor platelets by anticoagulation protocol: Yes   Plan:  Apixaban 2.5mg  bid Will provide medication education prior to d/c   Allergies  Allergen Reactions  . Hydrochlorothiazide Anaphylaxis  . Ace Inhibitors Cough  . Ciprofloxacin   . Losartan Cough  . Sulfa Antibiotics     Patient Measurements: Height: 5\' 7"  (170.2 cm) Weight: 133 lb 4.8 oz (60.5 kg) IBW/kg (Calculated) : 66.1 Heparin Dosing Weight: 59kg  Vital Signs: Temp: 97.4 F (36.3 C) (10/18 0410) Temp Source: Oral (10/18 0410) BP: 143/67 (10/18 1000) Pulse Rate: 51 (10/18 0410)  Labs:  Recent Labs  03/18/16 1107 03/18/16 1222 03/18/16 1958 03/19/16 0420  HGB 16.0 15.7  --   --   HCT 47.4 45.6  --   --   PLT 221 212  --   --   LABPROT  --   --   --  14.4  INR  --   --   --  1.11  HEPARINUNFRC  --   --  0.46 0.52  CREATININE 1.18 1.12  --   --   TROPONINI  --  <0.03 0.03*  --     Estimated Creatinine Clearance: 42.8 mL/min (by C-G formula based on SCr of 1.12 mg/dL).   Medical History: Past Medical History:  Diagnosis Date  . Age-related macular degeneration   . Allergic rhinitis   . Aortic valve stenosis   . Carotid artery obstruction   . Chronic lower back pain   . Coronary arteriosclerosis in native artery   . Diverticulitis of colon   . DJD (degenerative joint disease)   . Dysthymia   . History of elevated PSA 05/2009  . HLD (hyperlipidemia)   . Hypertension   . Nocturnal hypoxemia   . OSA (obstructive sleep apnea)    intolerant to CPAP  . Paroxysmal supraventricular  tachycardia ([place])   . Primary malignant neoplasm of bladder ([place])   . Prostate cancer ([place])     Medications:  Infusions:  . diltiazem (CARDIZEM) infusion     Erin Hearing PharmD., BCPS Clinical Pharmacist Pager [phone] 03/19/2016 10:06 AM

## 2016-03-20 ENCOUNTER — Encounter (HOSPITAL_COMMUNITY): Payer: Self-pay | Admitting: *Deleted

## 2016-03-20 ENCOUNTER — Encounter: Payer: Medicare Other | Admitting: Surgery

## 2016-03-20 ENCOUNTER — Encounter (HOSPITAL_COMMUNITY): Payer: Medicare Other

## 2016-03-20 DIAGNOSIS — I2510 Atherosclerotic heart disease of native coronary artery without angina pectoris: Secondary | ICD-10-CM | POA: Diagnosis not present

## 2016-03-20 DIAGNOSIS — G8929 Other chronic pain: Secondary | ICD-10-CM | POA: Diagnosis present

## 2016-03-20 DIAGNOSIS — G4733 Obstructive sleep apnea (adult) (pediatric): Secondary | ICD-10-CM | POA: Diagnosis present

## 2016-03-20 DIAGNOSIS — M545 Low back pain: Secondary | ICD-10-CM | POA: Diagnosis present

## 2016-03-20 DIAGNOSIS — I480 Paroxysmal atrial fibrillation: Secondary | ICD-10-CM | POA: Diagnosis not present

## 2016-03-20 DIAGNOSIS — I495 Sick sinus syndrome: Secondary | ICD-10-CM | POA: Diagnosis not present

## 2016-03-20 DIAGNOSIS — Z7982 Long term (current) use of aspirin: Secondary | ICD-10-CM | POA: Diagnosis not present

## 2016-03-20 DIAGNOSIS — E785 Hyperlipidemia, unspecified: Secondary | ICD-10-CM | POA: Diagnosis not present

## 2016-03-20 DIAGNOSIS — I350 Nonrheumatic aortic (valve) stenosis: Secondary | ICD-10-CM | POA: Diagnosis not present

## 2016-03-20 DIAGNOSIS — R0789 Other chest pain: Secondary | ICD-10-CM | POA: Diagnosis present

## 2016-03-20 DIAGNOSIS — I248 Other forms of acute ischemic heart disease: Secondary | ICD-10-CM | POA: Diagnosis not present

## 2016-03-20 DIAGNOSIS — I739 Peripheral vascular disease, unspecified: Secondary | ICD-10-CM | POA: Diagnosis not present

## 2016-03-20 DIAGNOSIS — Z79899 Other long term (current) drug therapy: Secondary | ICD-10-CM | POA: Diagnosis not present

## 2016-03-20 DIAGNOSIS — I2584 Coronary atherosclerosis due to calcified coronary lesion: Secondary | ICD-10-CM | POA: Diagnosis present

## 2016-03-20 DIAGNOSIS — Z882 Allergy status to sulfonamides status: Secondary | ICD-10-CM | POA: Diagnosis not present

## 2016-03-20 DIAGNOSIS — I060 Rheumatic aortic stenosis: Secondary | ICD-10-CM

## 2016-03-20 DIAGNOSIS — Z881 Allergy status to other antibiotic agents status: Secondary | ICD-10-CM | POA: Diagnosis not present

## 2016-03-20 DIAGNOSIS — I447 Left bundle-branch block, unspecified: Secondary | ICD-10-CM | POA: Diagnosis not present

## 2016-03-20 DIAGNOSIS — Z45018 Encounter for adjustment and management of other part of cardiac pacemaker: Secondary | ICD-10-CM | POA: Diagnosis not present

## 2016-03-20 DIAGNOSIS — I6523 Occlusion and stenosis of bilateral carotid arteries: Secondary | ICD-10-CM | POA: Diagnosis not present

## 2016-03-20 DIAGNOSIS — Z7901 Long term (current) use of anticoagulants: Secondary | ICD-10-CM | POA: Diagnosis not present

## 2016-03-20 DIAGNOSIS — R918 Other nonspecific abnormal finding of lung field: Secondary | ICD-10-CM | POA: Diagnosis present

## 2016-03-20 DIAGNOSIS — I5033 Acute on chronic diastolic (congestive) heart failure: Secondary | ICD-10-CM | POA: Diagnosis not present

## 2016-03-20 DIAGNOSIS — I700 Atherosclerosis of aorta: Secondary | ICD-10-CM | POA: Diagnosis present

## 2016-03-20 DIAGNOSIS — I471 Supraventricular tachycardia: Secondary | ICD-10-CM | POA: Diagnosis not present

## 2016-03-20 DIAGNOSIS — I110 Hypertensive heart disease with heart failure: Secondary | ICD-10-CM | POA: Diagnosis not present

## 2016-03-20 DIAGNOSIS — H353190 Nonexudative age-related macular degeneration, unspecified eye, stage unspecified: Secondary | ICD-10-CM | POA: Diagnosis present

## 2016-03-20 DIAGNOSIS — E7800 Pure hypercholesterolemia, unspecified: Secondary | ICD-10-CM | POA: Diagnosis present

## 2016-03-20 LAB — BASIC METABOLIC PANEL
Anion gap: 7 (ref 5–15)
BUN: 13 mg/dL (ref 6–20)
CALCIUM: 9.1 mg/dL (ref 8.9–10.3)
CO2: 21 mmol/L — AB (ref 22–32)
CREATININE: 1.01 mg/dL (ref 0.61–1.24)
Chloride: 112 mmol/L — ABNORMAL HIGH (ref 101–111)
GFR calc non Af Amer: >60 mL/min (ref >60)
Glucose, Bld: 87 mg/dL (ref 65–99)
Potassium: 3.9 mmol/L (ref 3.5–5.1)
SODIUM: 140 mmol/L (ref 135–145)

## 2016-03-20 LAB — LIPID PANEL
CHOL/HDL RATIO: 2.4 ratio
Cholesterol: 147 mg/dL (ref 0–200)
HDL: 61 mg/dL (ref >40)
LDL Cholesterol: 75 mg/dL (ref 0–99)
Triglycerides: 54 mg/dL (ref <150)
VLDL: 11 mg/dL (ref 0–40)

## 2016-03-20 LAB — CBC
HCT: 42.2 % (ref 39.0–52.0)
HEMOGLOBIN: 14.2 g/dL (ref 13.0–17.0)
MCH: 30.5 pg (ref 26.0–34.0)
MCHC: 33.6 g/dL (ref 30.0–36.0)
MCV: 90.6 fL (ref 78.0–100.0)
Platelets: 183 10*3/uL (ref 150–400)
RBC: 4.66 MIL/uL (ref 4.22–5.81)
RDW: 13.2 % (ref 11.5–15.5)
WBC: 3.6 10*3/uL — ABNORMAL LOW (ref 4.0–10.5)

## 2016-03-20 NOTE — Progress Notes (Signed)
      Ryan [name] [phone]       Ryan [name],Ryan [name] [id number]             [phone]     CARDIOTHORACIC SURGERY PROGRESS NOTE  Subjective: Patient feels well presently  Objective: Vital signs in last 24 hours: Temp:  [97.4 F (36.3 C)-98.1 F (36.7 C)] 97.7 F (36.5 C) (10/19 2043) Pulse Rate:  [49-77] 77 (10/19 1640) Cardiac Rhythm: Normal sinus rhythm (10/19 2005) Resp:  [14-18] 14 (10/19 1007) BP: (134-153)/(60-101) 153/101 (10/19 1640) SpO2:  [95 %-96 %] 95 % (10/19 1640) Weight:  [130 lb 1.6 oz (59 kg)] 130 lb 1.6 oz (59 kg) (10/19 0400)  Physical Exam:  Rhythm:   Afib w/ HR 100-110  Breath sounds: clear  Heart sounds:  Irregular w/ systolic murmur  Incisions:  n/a  Abdomen:  soft  Extremities:  warm   Intake/Output from previous day: 10/18 0701 - 10/19 0700 In: 360 [P.O.:360] Out: 1380 [Urine:1380] Intake/Output this shift: No intake/output data recorded.  Lab Results:  Recent Labs  03/18/16 1222 03/20/16 0422  WBC 4.9 3.6*  HGB 15.7 14.2  HCT 45.6 42.2  PLT 212 183   BMET:  Recent Labs  03/18/16 1222 03/20/16 0422  NA 140 140  K 3.9 3.9  CL 109 112*  CO2 21* 21*  GLUCOSE 93 87  BUN 20 13  CREATININE 1.12 1.01  CALCIUM 9.5 9.1    CBG (last 3)  No results for input(s): GLUCAP in the last 72 hours. PT/INR:   Recent Labs  03/19/16 0420  LABPROT 14.4  INR 1.11    CXR:  N/A  Assessment/Plan:  Patient is well known to me from recent outpatient office consultation.  Circumstances regarding his development of recurrent paroxysmal atrial fibrillation noted.  I agree with Dr. Tamala Julian that this likely explains his recent symptoms of palpitations w/ dizzy spells and near syncope.  He has known severe multivessel CAD with high grade stenosis of mid RCA and moderate disease in the LCx system.  He has aortic stenosis that is bordering on severe.  I feel the most reasonable options include conventional surgical AVR + CABG with or without maze  procedure versus conventional medical therapy for PAF with PCI and stenting of RCA followed by TAVR at a delayed date.  Discussed these options briefly with Ryan [name] including issues regarding medical therapy for PAF with concurrent DAPT for PCI.  Discussed the possibility of maze procedure or at least clipping of LA appendage if the patient were to undergo conventional surgery.  Ryan [name] remains reluctant to consider conventional surgery and is hoping to stick with a less invasive approach.  Will ask one of the Interventional Cardiologists involved with the multidisciplinary heart valve team to speak with him.  This can be done as an outpatient if he is to be discharged in the morning.   I spent in excess of 30 minutes during the conduct of this hospital encounter and >50% of this time involved direct face-to-face encounter with the patient for counseling and/or coordination of their care.    Rexene Alberts, MD 03/20/2016 9:30 PM

## 2016-03-20 NOTE — Progress Notes (Signed)
Patient Name: Ryan [name] Date of Encounter: 03/20/2016  Primary Cardiologist: Ena Dawley, MD   Hospital Problem List     Principal Problem:   PAF (paroxysmal atrial fibrillation) [facility]) Active Problems:   Acute on chronic diastolic heart failure (HCC)   Atrial fibrillation with RVR (HCC)   Atrial fibrillation with rapid ventricular response ([place])   Hypertensive heart disease with heart failure ([place])   Pure hypercholesterolemia   Subjective   Doing well. No chest pain or dyspnea. Wants to go home.   Inpatient Medications    Scheduled Meds: . amiodarone  400 mg Oral BID  . apixaban  2.5 mg Oral BID  . aspirin EC  81 mg Oral Daily  . cholecalciferol  1,000 Units Oral Daily  . diltiazem  10 mg Intravenous Once  . latanoprost  1 drop Both Eyes QHS  . lisinopril  20 mg Oral Daily  . metoprolol tartrate  12.5 mg Oral BID  . metoprolol tartrate  25 mg Oral Once  . pantoprazole  40 mg Oral Daily  . sodium chloride flush  3 mL Intravenous Q12H   Continuous Infusions: . diltiazem (CARDIZEM) infusion     PRN Meds: sodium chloride, acetaminophen, metoprolol, ondansetron (ZOFRAN) IV, sodium chloride flush   Vital Signs    Vitals:   03/20/16 0400 03/20/16 0812 03/20/16 0912 03/20/16 1007  BP:  (!) 148/60 (!) 148/62 136/60  Pulse:  (!) 54  (!) 49  Resp:  18  14  Temp: 97.5 F (36.4 C) 97.8 F (36.6 C) 97.4 F (36.3 C)   TempSrc: Oral Oral Oral   SpO2:  96%    Weight: 130 lb 1.6 oz (59 kg)     Height:        Intake/Output Summary (Last 24 hours) at 03/20/16 1055 Last data filed at 03/20/16 0946  Gross per 24 hour  Intake              440 ml  Output              980 ml  Net             -540 ml   Filed Weights   03/18/16 1508 03/19/16 0410 03/20/16 0400  Weight: 131 lb 6.3 oz (59.6 kg) 133 lb 4.8 oz (60.5 kg) 130 lb 1.6 oz (59 kg)    Physical Exam   GEN: Well nourished, well developed, in no acute distress.  HEENT: Grossly normal.  Neck: Supple,  no JVD, carotid bruits, or masses. Cardiac: RRR, III/VI systolic murmurs, rubs, or gallops. No clubbing, cyanosis, edema.  Radials/DP/PT 2+ and equal bilaterally.  Respiratory:  Respirations regular and unlabored, clear to auscultation bilaterally. GI: Soft, nontender, nondistended, BS + x 4. MS: no deformity or atrophy. Skin: warm and dry, no rash. Neuro:  Strength and sensation are intact. Psych: AAOx3.  Normal affect.  Labs    CBC  Recent Labs  03/18/16 1222 03/20/16 0422  WBC 4.9 3.6*  NEUTROABS 3.2  --   HGB 15.7 14.2  HCT 45.6 42.2  MCV 90.5 90.6  PLT 212 409   Basic Metabolic Panel  Recent Labs  03/18/16 1222 03/20/16 0422  NA 140 140  K 3.9 3.9  CL 109 112*  CO2 21* 21*  GLUCOSE 93 87  BUN 20 13  CREATININE 1.12 1.01  CALCIUM 9.5 9.1   Liver Function Tests  Recent Labs  03/18/16 1222  AST 26  ALT 19  ALKPHOS 64  BILITOT 0.6  PROT 6.4*  ALBUMIN 4.0   No results for input(s): LIPASE, AMYLASE in the last 72 hours. Cardiac Enzymes  Recent Labs  03/18/16 1222 03/18/16 1958  TROPONINI <0.03 0.03*   BNP Invalid input(s): POCBNP D-Dimer No results for input(s): DDIMER in the last 72 hours. Hemoglobin A1C  Recent Labs  03/18/16 1958  HGBA1C 5.9*   Fasting Lipid Panel  Recent Labs  03/20/16 0422  CHOL 147  HDL 61  LDLCALC 75  TRIG 54  CHOLHDL 2.4   Thyroid Function Tests  Recent Labs  03/18/16 1958  TSH 2.194    Telemetry    Sinus rhythm at rate of 50-60s. Yesterday went into sinus bradycardia with HR 30-40s and junctional rhythm  ECG    NA  Radiology    Ct Coronary Morph W/cta Cor W/score W/ca W/cm &/or Wo/cm  Addendum Date: 03/19/2016   ADDENDUM REPORT: 03/19/2016 17:46 CLINICAL DATA:  82 year old male with severe aortic stenosis. EXAM: Cardiac TAVR CT TECHNIQUE: The patient was scanned on a Philips 256 scanner. A 120 kV retrospective scan was triggered in the descending thoracic aorta at 111 HU's. Gantry rotation  speed was 270 msecs and collimation was .9 mm. 10 mg of iv Metoprolol and no nitro were given. The 3D data set was reconstructed in 5% intervals of the R-R cycle. Systolic and diastolic phases were analyzed on a dedicated work station using MPR, MIP and VRT modes. The patient received 80 cc of contrast. FINDINGS: Aortic Valve: Trileaflet, severely thickened and calcified with severely restricted leaflet opening. There is minimal calcification extending into the LVOT. Aorta:  Normal size, moderate diffuse calcifications, no dissection. Sinotubular Junction:  31 x 31 mm Ascending Thoracic Aorta:  35 x 34 mm Aortic Arch:  25 x 25 mm Descending Thoracic Aorta:  23 x 23 mm Sinus of Valsalva Measurements: Non-coronary:  35 mm Right -coronary:  36 mm Left -coronary:  36 mm Coronary Artery Height above Annulus: Left Main:  11 mm Right Coronary:  12 mm Virtual Basal Annulus Measurements: Maximum/Minimum Diameter:  27 x 26 mm Perimeter:  82 mm Area:  504 mm2 Optimum Fluoroscopic Angle for Delivery:  LAO 8 CAU 7 Other findings: A large left atrial appendage without evidence of a thrombus. Normal pulmonary vein drainage into the left atrium. Dilated pulmonary artery measuring 34 x 25 mm. IMPRESSION: 1. Trileaflet, severely thickened and calcified aortic valve with severely restricted leaflet opening and annular measurements suitable for delivery of a 26 mm Edwards-SAPIEN 3 valve. 2. Sufficient annulus to coronary distance. 3. Optimum Fluoroscopic Angle for Delivery:  LAO 8 CAU 7. 4. A large left atrial appendage without evidence of a thrombus 5. Dilated pulmonary artery measuring 34 x 25 mm Ena Dawley Electronically Signed   By: Ena Dawley   On: 03/19/2016 17:46   Result Date: 03/19/2016 EXAM: OVER-READ INTERPRETATION  CT CHEST The following report is an over-read performed by radiologist Dr. Rebekah Chesterfield [facility] on 03/19/2016. This over-read does not include interpretation of cardiac or  coronary anatomy or pathology. The coronary calcium score/coronary CTA interpretation by the cardiologist is attached. COMPARISON:  None. FINDINGS: Full description of extracardiac findings will be dictated separately a under requisition for contemporaneously obtained CTA of the chest, abdomen and pelvis 03/19/2016. IMPRESSION: Please see separate dictation for contemporaneously obtained CTA of the chest, abdomen and pelvis 03/19/2016 for full description of extracardiac findings. Electronically Signed: By: Vinnie Langton M.D. On: 03/19/2016 16:53   Dg Chest  Port 1 View  Result Date: 03/18/2016 CLINICAL DATA:  Episode of palpitations, chest pressure and fatigue at doctor's office today, worsening symptoms, history hypertension, prostate cancer, coronary artery disease, carotid artery disease, bladder cancer EXAM: PORTABLE CHEST 1 VIEW COMPARISON:  Portable exam 1150 hours without priors for comparison FINDINGS: Upper normal heart size. Calcified and elongated thoracic aorta. Mediastinal contours and pulmonary vascularity normal. Lungs clear. No pleural effusion or pneumothorax. Bones demineralized. IMPRESSION: No acute abnormalities. Aortic atherosclerosis. Electronically Signed   By: Lavonia Dana M.D.   On: 03/18/2016 12:09   Ct Angio Chest Aorta W/cm &/or Wo/cm  Result Date: 03/20/2016 CLINICAL DATA:  82 year old male with history of severe aortic stenosis. Preprocedural study prior to potential transcatheter aortic valve replacement (TAVR). Chest tightness and sensation of racing heart beat. Fatigue yesterday. EXAM: CT ANGIOGRAPHY CHEST, ABDOMEN AND PELVIS TECHNIQUE: Multidetector CT imaging through the chest, abdomen and pelvis was performed using the standard protocol during bolus administration of intravenous contrast. Multiplanar reconstructed images and MIPs were obtained and reviewed to evaluate the vascular anatomy. CONTRAST:  70 mL of Isovue 370. COMPARISON:  No priors. FINDINGS: CTA CHEST  FINDINGS Cardiovascular: Heart size is mildly enlarged. There is no significant pericardial fluid, thickening or pericardial calcification. There is aortic atherosclerosis, as well as atherosclerosis of the great vessels of the mediastinum and the coronary arteries, including calcified atherosclerotic plaque in the left main, left anterior descending, left circumflex and right coronary arteries. Severe thickening calcification of the aortic valve. Mediastinum/Lymph Nodes: No pathologically enlarged mediastinal or hilar lymph nodes. Densely calcified right hilar lymph node incidentally noted. Esophagus is unremarkable in appearance. No axillary lymphadenopathy. Lungs/Pleura: There are several pulmonary nodules scattered throughout the lungs bilaterally, the largest of which measures up to 8 x 6 mm (mean diameter of 7 mm) in the subpleural aspect of the right lower lobe (image 41 of series 407). These are nonspecific, but most of these are subpleural in location, likely to represent subpleural lymph nodes. Areas of scarring and/or subsegmental atelectasis are noted in the lung bases bilaterally, particularly in the right lower lobe. No acute consolidative airspace disease. No pleural effusions. Small right-sided Bochdalek's hernia incidentally noted. Musculoskeletal/Soft Tissues: There are no aggressive appearing lytic or blastic lesions noted in the visualized portions of the skeleton. CTA ABDOMEN AND PELVIS FINDINGS Hepatobiliary: Several sub cm well-defined low-attenuation lesions are noted in the liver, largest of which measures up to 9 mm in segment 4A. These are too small to definitively characterize, but are favored to represent tiny cysts. No other definite suspicious appearing hepatic lesions are noted. There is no intra or extrahepatic biliary ductal dilatation. Gallbladder is normal in appearance. Pancreas: No pancreatic mass. No pancreatic ductal dilatation. No pancreatic or peripancreatic fluid or  inflammatory changes. Spleen: Unremarkable. Adrenals/Urinary Tract: Sub cm low-attenuation lesions in the left kidney are too small to definitively characterize, but are statistically likely tiny cysts. Right kidney and bilateral adrenal glands are normal in appearance. No hydroureteronephrosis. Urinary bladder is normal in appearance. Stomach/Bowel: The appearance of the stomach is normal. There is no pathologic dilatation of small bowel or colon. The appendix is not confidently identified and may be surgically absent. Regardless, there are no inflammatory changes noted adjacent to the cecum to suggest the presence of an acute appendicitis at this time. Vascular/Lymphatic: Vascular findings and measurements pertinent to potential TAVR procedure, as detailed below. No aneurysm or dissection identified in the abdominal or pelvic vasculature. The celiac axis, superior mesenteric artery and inferior  mesenteric artery all appear widely patent, without hemodynamically significant stenosis. There are single renal arteries bilaterally which are both widely patent. There is complete inclusion of the right internal iliac artery proximally, which is subsequently reconstituted distally from collateral pathways. No lymphadenopathy noted in the abdomen or pelvis. Reproductive: Fiducial markers associated with the prostate gland. Prostate gland and seminal vesicles are otherwise unremarkable in appearance. Other: Low-attenuation material in the right inguinal region, presumably a Prolene plug from prior herniorrhaphy. No significant volume of ascites. No pneumoperitoneum. Musculoskeletal: Bilateral pars defects at L5 with 14 mm of anterolisthesis of L5 upon S1. There are no aggressive appearing lytic or blastic lesions noted in the visualized portions of the skeleton. VASCULAR MEASUREMENTS PERTINENT TO TAVR: AORTA: Minimal Aortic Diameter -  13 x 14 mm Severity of Aortic Calcification -  moderate RIGHT PELVIS: Right Common Iliac  Artery - Minimal Diameter - 7.7 x 6.4 mm Tortuosity - mild Calcification - moderate Right External Iliac Artery - Minimal Diameter - 7.2 x 7.1 mm Tortuosity - severe Calcification - none Right Common Femoral Artery - Minimal Diameter - 6.9 x 8.0 mm Tortuosity - mild Calcification - none LEFT PELVIS: Left Common Iliac Artery - Minimal Diameter - 6.1 x 7.3 mm Tortuosity - moderate Calcification - mild Left External Iliac Artery - Minimal Diameter - 6.8 x 6.9 mm Tortuosity - severe Calcification - none Left Common Femoral Artery - Minimal Diameter - 6.5 x 7.9 mm Tortuosity - mild Calcification - mild Review of the MIP images confirms the above findings. IMPRESSION: 1. Vascular findings and measurements pertinent to potential TAVR procedure, as above. This patient appears to have suitable pelvic arterial access bilaterally from a size perspective, although there is extensive tortuosity bilaterally, particularly in the external iliac arteries, as detailed above. 2. Severe thickening calcification of the aortic valve, compatible with the clinical history of severe aortic stenosis. 3. Aortic atherosclerosis, in addition to left main and 3 vessel coronary artery disease. 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 4. Mild cardiomegaly. 5. Multiple small pulmonary nodules scattered throughout the lungs bilaterally, measuring 7 mm or less in mean diameter. Many of these are subpleural in location, and are therefore favored to represent benign subpleural lymph nodes, however, these are all considered nonspecific. Non-contrast chest CT at 3-6 months is recommended. If the nodules are stable at time of repeat CT, then future CT at 18-24 months (from today's scan) is considered optional for low-risk patients, but is recommended for high-risk patients. This recommendation follows the consensus  statement: Guidelines for Management of Incidental Pulmonary Nodules Detected on CT Images: From the Fleischner Society 2017; Radiology 2017; 284:228-243. 6. Additional incidental findings, as above. Electronically Signed   By: Vinnie Langton M.D.   On: 03/20/2016 10:19   Ct Angio Abd/pel W/ And/or W/o  Result Date: 03/20/2016 CLINICAL DATA:  82 year old male with history of severe aortic stenosis. Preprocedural study prior to potential transcatheter aortic valve replacement (TAVR). Chest tightness and sensation of racing heart beat. Fatigue yesterday. EXAM: CT ANGIOGRAPHY CHEST, ABDOMEN AND PELVIS TECHNIQUE: Multidetector CT imaging through the chest, abdomen and pelvis was performed using the standard protocol during bolus administration of intravenous contrast. Multiplanar reconstructed images and MIPs were obtained and reviewed to evaluate the vascular anatomy. CONTRAST:  70 mL of Isovue 370. COMPARISON:  No priors. FINDINGS: CTA CHEST FINDINGS Cardiovascular: Heart size is mildly enlarged.  There is no significant pericardial fluid, thickening or pericardial calcification. There is aortic atherosclerosis, as well as atherosclerosis of the great vessels of the mediastinum and the coronary arteries, including calcified atherosclerotic plaque in the left main, left anterior descending, left circumflex and right coronary arteries. Severe thickening calcification of the aortic valve. Mediastinum/Lymph Nodes: No pathologically enlarged mediastinal or hilar lymph nodes. Densely calcified right hilar lymph node incidentally noted. Esophagus is unremarkable in appearance. No axillary lymphadenopathy. Lungs/Pleura: There are several pulmonary nodules scattered throughout the lungs bilaterally, the largest of which measures up to 8 x 6 mm (mean diameter of 7 mm) in the subpleural aspect of the right lower lobe (image 41 of series 407). These are nonspecific, but most of these are subpleural in location, likely to  represent subpleural lymph nodes. Areas of scarring and/or subsegmental atelectasis are noted in the lung bases bilaterally, particularly in the right lower lobe. No acute consolidative airspace disease. No pleural effusions. Small right-sided Bochdalek's hernia incidentally noted. Musculoskeletal/Soft Tissues: There are no aggressive appearing lytic or blastic lesions noted in the visualized portions of the skeleton. CTA ABDOMEN AND PELVIS FINDINGS Hepatobiliary: Several sub cm well-defined low-attenuation lesions are noted in the liver, largest of which measures up to 9 mm in segment 4A. These are too small to definitively characterize, but are favored to represent tiny cysts. No other definite suspicious appearing hepatic lesions are noted. There is no intra or extrahepatic biliary ductal dilatation. Gallbladder is normal in appearance. Pancreas: No pancreatic mass. No pancreatic ductal dilatation. No pancreatic or peripancreatic fluid or inflammatory changes. Spleen: Unremarkable. Adrenals/Urinary Tract: Sub cm low-attenuation lesions in the left kidney are too small to definitively characterize, but are statistically likely tiny cysts. Right kidney and bilateral adrenal glands are normal in appearance. No hydroureteronephrosis. Urinary bladder is normal in appearance. Stomach/Bowel: The appearance of the stomach is normal. There is no pathologic dilatation of small bowel or colon. The appendix is not confidently identified and may be surgically absent. Regardless, there are no inflammatory changes noted adjacent to the cecum to suggest the presence of an acute appendicitis at this time. Vascular/Lymphatic: Vascular findings and measurements pertinent to potential TAVR procedure, as detailed below. No aneurysm or dissection identified in the abdominal or pelvic vasculature. The celiac axis, superior mesenteric artery and inferior mesenteric artery all appear widely patent, without hemodynamically significant  stenosis. There are single renal arteries bilaterally which are both widely patent. There is complete inclusion of the right internal iliac artery proximally, which is subsequently reconstituted distally from collateral pathways. No lymphadenopathy noted in the abdomen or pelvis. Reproductive: Fiducial markers associated with the prostate gland. Prostate gland and seminal vesicles are otherwise unremarkable in appearance. Other: Low-attenuation material in the right inguinal region, presumably a Prolene plug from prior herniorrhaphy. No significant volume of ascites. No pneumoperitoneum. Musculoskeletal: Bilateral pars defects at L5 with 14 mm of anterolisthesis of L5 upon S1. There are no aggressive appearing lytic or blastic lesions noted in the visualized portions of the skeleton. VASCULAR MEASUREMENTS PERTINENT TO TAVR: AORTA: Minimal Aortic Diameter -  13 x 14 mm Severity of Aortic Calcification -  moderate RIGHT PELVIS: Right Common Iliac Artery - Minimal Diameter - 7.7 x 6.4 mm Tortuosity - mild Calcification - moderate Right External Iliac Artery - Minimal Diameter - 7.2 x 7.1 mm Tortuosity - severe Calcification - none Right Common Femoral Artery - Minimal Diameter - 6.9 x 8.0 mm Tortuosity - mild Calcification - none LEFT PELVIS: Left Common Iliac  Artery - Minimal Diameter - 6.1 x 7.3 mm Tortuosity - moderate Calcification - mild Left External Iliac Artery - Minimal Diameter - 6.8 x 6.9 mm Tortuosity - severe Calcification - none Left Common Femoral Artery - Minimal Diameter - 6.5 x 7.9 mm Tortuosity - mild Calcification - mild Review of the MIP images confirms the above findings. IMPRESSION: 1. Vascular findings and measurements pertinent to potential TAVR procedure, as above. This patient appears to have suitable pelvic arterial access bilaterally from a size perspective, although there is extensive tortuosity bilaterally, particularly in the external iliac arteries, as detailed above. 2. Severe  thickening calcification of the aortic valve, compatible with the clinical history of severe aortic stenosis. 3. Aortic atherosclerosis, in addition to left main and 3 vessel coronary artery disease. 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 4. Mild cardiomegaly. 5. Multiple small pulmonary nodules scattered throughout the lungs bilaterally, measuring 7 mm or less in mean diameter. Many of these are subpleural in location, and are therefore favored to represent benign subpleural lymph nodes, however, these are all considered nonspecific. Non-contrast chest CT at 3-6 months is recommended. If the nodules are stable at time of repeat CT, then future CT at 18-24 months (from today's scan) is considered optional for low-risk patients, but is recommended for high-risk patients. This recommendation follows the consensus statement: Guidelines for Management of Incidental Pulmonary Nodules Detected on CT Images: From the Fleischner Society 2017; Radiology 2017; 284:228-243. 6. Additional incidental findings, as above. Electronically Signed   By: Vinnie Langton M.D.   On: 03/20/2016 10:19    Cardiac Studies   LHC 01/17/16:  Prox RCA to Mid RCA lesion, 20 %stenosed.  Mid RCA lesion, 80 %stenosed.  Ost 1st Mrg to 1st Mrg lesion, 80 %stenosed.  Mid Cx to Dist Cx lesion, 70 %stenosed.  Prox LAD to Dist LAD lesion, 40 %stenosed.  The left ventricular systolic function is normal.  LV end diastolic pressure is normal.  The left ventricular ejection fraction is 55-65% by visual estimate.  LV end diastolic pressure is normal.   There is three-vessel coronary calcification.  80% eccentric mid RCA stenosis.  80% ostial obtuse marginal #1 stenosis.  Moderate diffuse calcified LAD disease less than 50%.  Normal left ventricular systolic function and  hemodynamics  Normal pulmonary pressures and pulmonary capillary wedge.  Moderate to severe aortic stenosis with aortic valve area 1.04.  Echo 10/10/15: Study Conclusions  - Left ventricle: The cavity size was normal. Wall thickness was increased in a pattern of mild LVH. Systolic function was normal. The estimated ejection fraction was in the range of 60% to 65%. Wall motion was normal; there were no regional wall motion abnormalities. Doppler parameters are consistent with abnormal left ventricular relaxation (grade 1 diastolic dysfunction). Doppler parameters are consistent with indeterminate ventricular filling pressure. - Aortic valve: Valve mobility was restricted. There was moderate stenosis. Peak velocity (S): 387.9 cm/s. Mean gradient (S): 25 mm Hg. Valve area (VTI): 1.04 cm^2. Valve area (Vmax): 1 cm^2. Valve area (Vmean): 1.22 cm^2. - Mitral valve: There was trivial regurgitation. - Left atrium: The atrium was mildly dilated. - Right ventricle: The cavity size was normal. Wall thickness was normal. Systolic function was normal. - Atrial septum: No defect or patent foramen ovale was identified by color flow Doppler. - Tricuspid valve: There was mild regurgitation. - Inferior vena cava: The vessel  was normal in size. The respirophasic diameter changes were in the normal range (= 50%), consistent with normal central venous pressure.   Carotid doppler 03/18/16 Summary:  - The vertebral arteries appear patent with antegrade flow. - Findings consistent with 1-39 percent stenosis involving the   right internal carotid artery. - Findings consistent with 40 - 76 percent stenosis involving the   left internal carotid artery.  Patient Profile     Mr. Hunsinger is an 12M with severe aortic stenosis, diffuse three vessel CAD, chronic diastolic heart failure and hypertensive heart disease noted to have atrial fibrillation with RVR when evaluated  in the office for critical aortic stenosis and obstructive coronary disease.  Assessment & Plan    1. Paroxysmal atrial fibrillation:  Mr. Trettin is back in atrial fibrillation yesterday morning and rates are poorly-controlled-->Stopped oral amiodarone and resumed the amiodarone with a bolus and infusion. Also started on metroprolol 12.5mg  BID for conduction disease. However later in day he had sinus bradycardia at rate of 30-40 and then junction rhythm.--> stopped IV amiodarone and now rate stable to 50-60s. Will continue Amiodarone 400mg  BID for now. If recurrent afib with RVR will consider EP consult for possible Tikosyn. Hold metoprolol for now. Continue Eliquis for anticoagulation. .  2.  Moderate to severe aortic stenosis:  Mean gradient 25 mmHg by echo 10/2015.  Valve area 1.04 cm^2 by cath 01/2016.  Currently being evaluated for SAVR vs. TAVR. CT chest and doppler study done.   3. CAD:  Has 80% lesions in the mid RCA and OM1 that are technically challenging to open via PCI, though not impossible.  He is undergoing evaluation for CABG vs  PCI as an outpatient.  Continue aspirin and Held metoprolol as above.  Avoid nitrates given AS.  4. HLD: He had memory impairment with statins in the past. 03/20/2016: Cholesterol 147; HDL 61; LDL Cholesterol 75; Triglycerides 54; VLDL 11Consider PCSK9 as an outpatient.    5. Hypertensive heart disease: Blood pressure is well-controlled on lisinopril and diltiazem.    Signed, Leanor Kail, PA  03/20/2016, 10:55 AM

## 2016-03-20 NOTE — Telephone Encounter (Signed)
Closed Encounter  °

## 2016-03-20 NOTE — Care Management Note (Addendum)
Case Management Note  Patient Details  Name: Ryan [name] MRN: [id number] Date of Birth: May 15, 1930  Subjective/Objective:  Pt presented for PAF. Plan for d/c home on Eliquis.                  Action/Plan: CM to provide pt with 30 day free card for Eliquis. Benefits check in process and will make pt aware of cost once completed. Pt uses [employer] and medication is available. No further needs from CM at this time.   Expected Discharge Date:  03/21/16               Expected Discharge Plan:  Home/Self Care  In-House Referral:  NA  Discharge planning Services  CM Consult, Medication Assistance  Post Acute Care Choice:  NA Choice offered to:  NA  DME Arranged:  N/A DME Agency:  NA  HH Arranged:  NA HH Agency:  NA  Status of Service:  Completed, signed off  If discussed at [place] of Stay Meetings, dates discussed:    Additional Comments:     Pt copay will be $74- prior auth not required. Pt is aware. No further needs from CM at this time.     Bethena Roys, RN 03/20/2016, 11:38 AM

## 2016-03-20 NOTE — Progress Notes (Signed)
Discussed with [facility] coordinator. Dr. Roxy Manns will see the patient later today for surgical plan. Will hold discharge since then.

## 2016-03-20 NOTE — Evaluation (Signed)
Physical Therapy Evaluation Patient Details Name: Ryan [name] MRN: [id number] DOB: 1929/08/28 Today's Date: 03/20/2016   History of Present Illness  82 yo male with onset of  PAF and RVR, L BBB with TAVR planned later today, consideration for CABG and MAZE.  PMHx:  macular degeneration, aortic v stenosis, DJD, OSA, bladder CA, carotid endarterectomy, 3 vessel CAD  Clinical Impression  Pt is up to walk today with effort to maintain his O2 sats, but is very motivated to make some progress.  Will plan to work on endurance and will do the post TAVR assessment when appropriate, may need a new PT order to continue depending on the procedure that is done today.  Will follow acutely as needed for gait and strengthening.    Follow Up Recommendations Home health PT;Supervision for mobility/OOB    Equipment Recommendations  None recommended by PT    Recommendations for Other Services Rehab consult     Precautions / Restrictions Precautions Precautions: Fall (telemetry) Restrictions Weight Bearing Restrictions: No      Mobility  Bed Mobility Overal bed mobility: Modified Independent                Transfers Overall transfer level: Modified independent Equipment used: Rolling walker (2 wheeled)                Ambulation/Gait Ambulation/Gait assistance: Min guard Ambulation Distance (Feet): 150 Feet Assistive device: Rolling walker (2 wheeled);1 person hand held assist Gait Pattern/deviations: Step-through pattern;Decreased stride length;Wide base of support;Trunk flexed Gait velocity: controlled Gait velocity interpretation: Below normal speed for age/gender General Gait Details: slow pace with O2 sats declining with mobility, had 2 standing rests with sat declined to 81% once and at rest quickly returns to 96%.    Stairs            Wheelchair Mobility    Modified Rankin (Stroke Patients Only)       Balance Overall balance assessment: Needs  assistance Sitting-balance support: Feet supported Sitting balance-Leahy Scale: Fair     Standing balance support: Bilateral upper extremity supported Standing balance-Leahy Scale: Poor Standing balance comment: poor+ to fair-                             Pertinent Vitals/Pain Pain Assessment: No/denies pain    Home Living Family/patient expects to be discharged to:: Private residence Living Arrangements: Spouse/significant other Available Help at Discharge: Family;Available 24 hours/day Type of Home: House         Home Equipment: Walker - 2 wheels Additional Comments: was on walker at home due to knee OA    Prior Function Level of Independence: Needs assistance   Gait / Transfers Assistance Needed: RW with supervision of wife  ADL's / Homemaking Assistance Needed: wife handles with housework and cooking        Hand Dominance   Dominant Hand: Right    Extremity/Trunk Assessment   Upper Extremity Assessment: Overall WFL for tasks assessed           Lower Extremity Assessment: Generalized weakness      Cervical / Trunk Assessment: Normal  Communication   Communication: No difficulties  Cognition Arousal/Alertness: Awake/alert Behavior During Therapy: WFL for tasks assessed/performed Overall Cognitive Status: Within Functional Limits for tasks assessed                      General Comments General comments (skin integrity, edema, etc.): Pt is  somewhat unsteady with mobility and is desaturating with gait which is likely source    Exercises     Assessment/Plan    PT Assessment Patient needs continued PT services  PT Problem List Decreased range of motion;Decreased strength;Decreased activity tolerance;Decreased balance;Decreased mobility;Decreased coordination;Decreased safety awareness;Cardiopulmonary status limiting activity          PT Treatment Interventions DME instruction;Gait training;Functional mobility  training;Therapeutic activities;Therapeutic exercise;Balance training;Neuromuscular re-education;Patient/family education    PT Goals (Current goals can be found in the Care Plan section)  Acute Rehab PT Goals Patient Stated Goal: to feel better, get up from the bed PT Goal Formulation: With patient/family Time For Goal Achievement: 04/03/16 Potential to Achieve Goals: Good    Frequency Min 3X/week   Barriers to discharge   wife has been able to care for him    Co-evaluation               End of Session Equipment Utilized During Treatment: Gait belt Activity Tolerance: Patient tolerated treatment well;Treatment limited secondary to medical complications (Comment) (lowering sats with gait) Patient left: in bed;with call bell/phone within reach;with family/visitor present;with bed alarm set Nurse Communication: Mobility status    Functional Assessment Tool Used: clinical judgment Functional Limitation: Mobility: Walking and moving around Mobility: Walking and Moving Around Current Status ([id number]): At least 20 percent but less than 40 percent impaired, limited or restricted Mobility: Walking and Moving Around Goal Status [phone]): At least 1 percent but less than 20 percent impaired, limited or restricted    Time: 1004-1032 PT Time Calculation (min) (ACUTE ONLY): 28 min   Charges:   PT Evaluation $PT Eval Moderate Complexity: 1 Procedure PT Treatments $Gait Training: 8-22 mins   PT G Codes:   PT G-Codes **NOT FOR INPATIENT CLASS** Functional Assessment Tool Used: clinical judgment Functional Limitation: Mobility: Walking and moving around Mobility: Walking and Moving Around Current Status (X0940): At least 20 percent but less than 40 percent impaired, limited or restricted Mobility: Walking and Moving Around Goal Status [phone]): At least 1 percent but less than 20 percent impaired, limited or restricted    Ramond Dial 03/20/2016, 12:03 PM    Mee Hives, PT  [facility] Acute Rehab Dept. Number: [place] [facility] [place]

## 2016-03-21 ENCOUNTER — Encounter (HOSPITAL_COMMUNITY)
Admission: EM | Disposition: A | Discharge status: Home / Self Care | Payer: Self-pay | Source: Home / Self Care | Attending: Interventional Cardiology

## 2016-03-21 DIAGNOSIS — I4891 Unspecified atrial fibrillation: Secondary | ICD-10-CM

## 2016-03-21 DIAGNOSIS — I495 Sick sinus syndrome: Secondary | ICD-10-CM

## 2016-03-21 HISTORY — PX: EP IMPLANTABLE DEVICE: SHX172B

## 2016-03-21 LAB — BASIC METABOLIC PANEL
ANION GAP: 7 (ref 5–15)
BUN: 13 mg/dL (ref 6–20)
CHLORIDE: 110 mmol/L (ref 101–111)
CO2: 22 mmol/L (ref 22–32)
Calcium: 9.2 mg/dL (ref 8.9–10.3)
Creatinine, Ser: 1.07 mg/dL (ref 0.61–1.24)
GFR calc non Af Amer: >60 mL/min (ref >60)
Glucose, Bld: 83 mg/dL (ref 65–99)
POTASSIUM: 3.9 mmol/L (ref 3.5–5.1)
SODIUM: 139 mmol/L (ref 135–145)

## 2016-03-21 LAB — CBC
HCT: 47.4 % (ref 39.0–52.0)
HEMOGLOBIN: 15.9 g/dL (ref 13.0–17.0)
MCH: 30.5 pg (ref 26.0–34.0)
MCHC: 33.5 g/dL (ref 30.0–36.0)
MCV: 90.8 fL (ref 78.0–100.0)
Platelets: 223 10*3/uL (ref 150–400)
RBC: 5.22 MIL/uL (ref 4.22–5.81)
RDW: 12.9 % (ref 11.5–15.5)
WBC: 4.4 10*3/uL (ref 4.0–10.5)

## 2016-03-21 SURGERY — PACEMAKER IMPLANT
Anesthesia: LOCAL

## 2016-03-21 MED ORDER — ONDANSETRON HCL 4 MG/2ML IJ SOLN: 4.0000 mg | Freq: Four times a day (QID) | INTRAMUSCULAR | Status: DC | PRN

## 2016-03-21 MED ORDER — SODIUM CHLORIDE 0.9 % IR SOLN
80.0000 mg | Status: AC
  Administered 2016-03-21: 80 mg

## 2016-03-21 MED ORDER — CEFAZOLIN SODIUM-DEXTROSE 2-4 GM/100ML-% IV SOLN
2.0000 g | INTRAVENOUS | Status: AC
  Administered 2016-03-21: 2 g via INTRAVENOUS

## 2016-03-21 MED ORDER — CEFAZOLIN SODIUM-DEXTROSE 2-4 GM/100ML-% IV SOLN
INTRAVENOUS | Status: AC
  Filled 2016-03-21: qty 100

## 2016-03-21 MED ORDER — SODIUM CHLORIDE 0.9 % IV SOLN
INTRAVENOUS | Status: DC
  Administered 2016-03-21: 15:00:00 via INTRAVENOUS

## 2016-03-21 MED ORDER — HEPARIN (PORCINE) IN NACL 2-0.9 UNIT/ML-% IJ SOLN
INTRAMUSCULAR | Status: DC | PRN
  Administered 2016-03-21: 500 mL via INTRAVENOUS

## 2016-03-21 MED ORDER — ACETAMINOPHEN 325 MG PO TABS
325.0000 mg | ORAL_TABLET | ORAL | Status: DC | PRN
  Administered 2016-03-21: 650 mg via ORAL
  Filled 2016-03-21: qty 2

## 2016-03-21 MED ORDER — LIDOCAINE HCL (PF) 1 % IJ SOLN
INTRAMUSCULAR | Status: DC | PRN
  Administered 2016-03-21: 45 mL via INTRADERMAL

## 2016-03-21 MED ORDER — SODIUM CHLORIDE 0.9 % IR SOLN
Status: AC
  Filled 2016-03-21: qty 2

## 2016-03-21 MED ORDER — CEFAZOLIN SODIUM-DEXTROSE 2-3 GM-% IV SOLR: INTRAVENOUS | Status: DC | PRN

## 2016-03-21 MED ORDER — LIDOCAINE HCL (PF) 1 % IJ SOLN
INTRAMUSCULAR | Status: AC
Start: 2016-03-21 — End: 2016-03-21
  Filled 2016-03-21: qty 60

## 2016-03-21 MED ORDER — VANCOMYCIN HCL IN DEXTROSE 1-5 GM/200ML-% IV SOLN
1000.0000 mg | Freq: Two times a day (BID) | INTRAVENOUS | Status: AC
  Administered 2016-03-22: 1000 mg via INTRAVENOUS
  Filled 2016-03-21: qty 200

## 2016-03-21 SURGICAL SUPPLY — 8 items
CABLE SURGICAL S-101-97-12 (CABLE) ×2 IMPLANT
LEAD CAPSURE NOVUS 5076-52CM (Lead) ×2 IMPLANT
LEAD CAPSURE NOVUS 5076-58CM (Lead) ×2 IMPLANT
PACEMAKER ADAPTA DR ADDRL1 (Pacemaker) ×1 IMPLANT
PAD DEFIB LIFELINK (PAD) ×2 IMPLANT
PPM ADAPTA DR ADDRL1 (Pacemaker) ×2 IMPLANT
SHEATH CLASSIC 7F (SHEATH) ×4 IMPLANT
TRAY PACEMAKER INSERTION (PACKS) ×2 IMPLANT

## 2016-03-21 NOTE — H&P (View-Only) (Signed)
ELECTROPHYSIOLOGY CONSULT NOTE   Patient ID: Ryan [name] MRN: [id number] DOB/AGE: [birth year] 82 y.o.  Admit date: 03/18/2016  Requesting Physician: Dr. Oval Linsey  Primary Physician:   Velna Hatchet, MD Primary Cardiologist:   Dr. Meda Coffee Reason for Consultation: tachy brady   HPI: Ryan [name] is a 82 y.o. male with a history of severe aortic stenosis, diffuse three vessel CAD, chronic diastolic heart failure, peripheral arterial disease with carotid endarterectomy bilaterally for severe stenosis in 1997, hyperlipidemia and hypertensive heart disease noted to have atrial fibrillation with RVR when evaluated in the office on 03/18/16 for critical aortic stenosis and obstructive coronary disease. He was directly admitted to [facility] from the office. Now noted on tele to go in and out of afib with up to 4 second post conversion pauses and sinus bradycardia. Electrophysiology consulted for help in management of tachy brady and possible PPM placement.   The patient had an echo performed 10/10/2015 which revealed moderate to severe aortic stenosis with peak velocity across the aortic valve measured 3.9 m/s corresponding to mean transvalvular gradient estimated 25 mmHg. He was seen in 01/2016 by Dr. Meda Coffee for evaluation of worsening DOE and fatigue. Cath on 01/17/16 by Dr. Tamala Julian confirmed the presence of aortic stenosis with mean transvalvular gradient measured 25.1 mmHg corresponding to aortic valve area calculated 1.04 cm and valve area index 0.62 cm/BSA.  Coronary angiography revealed multivessel coronary artery disease with discrete eccentric 80% stenosis of the mid right coronary artery, 80% ostial stenosis of a large first obtuse marginal branch of the left circumflex coronary artery, and diffuse nonobstructive disease in the left anterior descending coronary artery. Medical therapy was recommended. The patient was seen in follow-up by Dr. Meda Coffee and continued to complain of accelerating  symptoms of exertional shortness of breath and discomfort in the upper chest and neck. He was subsequently referred for surgical consultation.  He was seen by Dr. Roxy Manns on 03/12/16 for surgical consult for CABG/AVR vs PCI/TAVR. Patient reported he had palpitations associated with chest discomfort and severe dizzy spells with one near syncopal event.  Patient had a severe episode of chest pain and palpitations on 03/18/16 and was added onto Dr. Darliss Ridgel schedule in the office. He was found to be in afib with RVR HRs ~150. He was admitted from the office and started on IV amiodarone and IV heparin gtt with plans to switch to Eliquis 2.5mg  BID. While waiting to be admitted in the ER, he converted into NSR and felt much better. Patient had missed CT angio and cardiac CT that day, so it was decided to admit him for further observation and to get those studies. Fortunately, he did stay because he went back into afib with RVR the following AM. Dr. Roxy Manns saw him in the hospital and again discussed his options. Patient is leaning towards the less invasive approach of TAVR + PCI.   He is currently in bed and feeling fine. He is very relieved there is a solution to the problems he has been having. None currently. He has had episodes while here which seem to coincide with afib with rvr and long pauses. No CP or sob currently. No LE edema, orthopnea or PND. No dizziness right now.    Past Medical History:  Diagnosis Date  . Age-related macular degeneration   . Allergic rhinitis   . Aortic valve stenosis   . Carotid artery obstruction   . Chronic lower back pain   . Coronary arteriosclerosis in  native artery   . Diverticulitis of colon   . DJD (degenerative joint disease)   . Dysthymia   . History of elevated PSA 05/2009  . HLD (hyperlipidemia)   . Hypertension   . Nocturnal hypoxemia   . OSA (obstructive sleep apnea)    intolerant to CPAP  . Paroxysmal supraventricular tachycardia ([place])   . Primary  malignant neoplasm of bladder ([place])   . Prostate cancer [facility])      Past Surgical History:  Procedure Laterality Date  . ABDOMINAL SURGERY  1981   Meckles Diverticulum with volvulus  . APPENDECTOMY    . CARDIAC CATHETERIZATION N/A 01/17/2016   Procedure: Right/Left Heart Cath and Coronary Angiography;  Surgeon: Belva Crome, MD;  Location: [facility];  Service: Cardiovascular;  Laterality: N/A;  . CAROTID ENDARTERECTOMY     bilateral  . HERNIA REPAIR Right 2009    Allergies  Allergen Reactions  . Hydrochlorothiazide Anaphylaxis  . Ace Inhibitors Cough  . Ciprofloxacin   . Losartan Cough  . Sulfa Antibiotics     I have reviewed the patient's current medications . amiodarone  400 mg Oral BID  . apixaban  2.5 mg Oral BID  . aspirin EC  81 mg Oral Daily  . cholecalciferol  1,000 Units Oral Daily  . diltiazem  10 mg Intravenous Once  . latanoprost  1 drop Both Eyes QHS  . lisinopril  20 mg Oral Daily  . metoprolol tartrate  25 mg Oral Once  . pantoprazole  40 mg Oral Daily  . sodium chloride flush  3 mL Intravenous Q12H   . diltiazem (CARDIZEM) infusion     sodium chloride, acetaminophen, metoprolol, ondansetron (ZOFRAN) IV, sodium chloride flush  Prior to Admission medications   Medication Sig Start Date End Date Taking? Authorizing Provider  amLODipine (NORVASC) 10 MG tablet Take 10 mg by mouth daily.   Yes Historical Provider, MD  aspirin 81 MG tablet Take 81 mg by mouth daily.   Yes Historical Provider, MD  cholecalciferol (VITAMIN D) 1000 units tablet Take 1,000 Units by mouth daily.   Yes Historical Provider, MD  Coenzyme Q10 (COQ-10 PO) Take 1 capsule by mouth daily.   Yes Historical Provider, MD  latanoprost (XALATAN) 0.005 % ophthalmic solution Place 1 drop into both eyes at bedtime.   Yes Historical Provider, MD  lisinopril (PRINIVIL,ZESTRIL) 20 MG tablet Take 20 mg by mouth daily.   Yes Historical Provider, MD  omeprazole (PRILOSEC) 20 MG capsule Take 20  mg by mouth daily.   Yes Historical Provider, MD     Social History   Social History  . Marital status: Married    Spouse name: N/A  . Number of children: N/A  . Years of education: N/A   Occupational History  . retired    Social History Main Topics  . Smoking status: Never Smoker  . Smokeless tobacco: Never Used  . Alcohol use No  . Drug use: No  . Sexual activity: Not on file   Other Topics Concern  . Not on file   Social History Narrative  . No narrative on file    Family Status  Relation Status  . Mother Deceased  . Father Deceased   Family History  Problem Relation Age of Onset  . Heart disease Mother   . Heart disease Father     ROS:  Full 14 point review of systems complete and found to be negative unless listed above.  Physical Exam: Blood pressure  105/86, pulse 73, temperature 97.7 F (36.5 C), temperature source Oral, resp. rate 15, height 5\' 7"  (1.702 m), weight 129 lb 12.8 oz (58.9 kg), SpO2 96 %.  General: Well developed, well nourished, male in no acute distress Head: Eyes PERRLA, No xanthomas.   Normocephalic and atraumatic, oropharynx without edema or exudate. Dentition:  Lungs: CTAB Heart: HRRR S1 S2, no rub/gallop, Heart regular rate and rhythm with S1, S2  murmur. pulses are 2+ extrem.   Neck: No carotid bruits. No lymphadenopathy. No  JVD. Abdomen: Bowel sounds present, abdomen soft and non-tender without masses or hernias noted. Msk:  No spine or cva tenderness. No weakness, no joint deformities or effusions. Extremities: No clubbing or cyanosis.  No LE edema.  Neuro: Alert and oriented X 3. No focal deficits noted. Psych:  Good affect, responds appropriately Skin: No rashes or lesions noted.  Labs:   Lab Results  Component Value Date   WBC 4.4 03/21/2016   HGB 15.9 03/21/2016   HCT 47.4 03/21/2016   MCV 90.8 03/21/2016   PLT 223 03/21/2016    Recent Labs  03/19/16 0420  INR 1.11    Recent Labs Lab 03/18/16 1222   03/21/16 0544  NA 140  < > 139  K 3.9  < > 3.9  CL 109  < > 110  CO2 21*  < > 22  BUN 20  < > 13  CREATININE 1.12  < > 1.07  CALCIUM 9.5  < > 9.2  PROT 6.4*  --   --   BILITOT 0.6  --   --   ALKPHOS 64  --   --   ALT 19  --   --   AST 26  --   --   GLUCOSE 93  < > 83  ALBUMIN 4.0  --   --   < > = values in this interval not displayed. No results found for: MG  Recent Labs  03/18/16 1222 03/18/16 1958  TROPONINI <0.03 0.03*   No results for input(s): TROPIPOC in the last 72 hours. No results found for: PROBNP Lab Results  Component Value Date   CHOL 147 03/20/2016   HDL 61 03/20/2016   LDLCALC 75 03/20/2016   TRIG 54 03/20/2016   No results found for: DDIMER No results found for: LIPASE, AMYLASE TSH  Date/Time Value Ref Range Status  03/18/2016 07:58 PM 2.194 0.350 - 4.500 uIU/mL Final    Comment:    Performed by a 3rd Generation assay with a functional sensitivity of <=0.01 uIU/mL.   No results found for: VITAMINB12, FOLATE, FERRITIN, TIBC, IRON, RETICCTPCT  LHC 01/17/16:  Prox RCA to Mid RCA lesion, 20 %stenosed.  Mid RCA lesion, 80 %stenosed.  Ost 1st Mrg to 1st Mrg lesion, 80 %stenosed.  Mid Cx to Dist Cx lesion, 70 %stenosed.  Prox LAD to Dist LAD lesion, 40 %stenosed.  The left ventricular systolic function is normal.  LV end diastolic pressure is normal.  The left ventricular ejection fraction is 55-65% by visual estimate.  LV end diastolic pressure is normal.   There is three-vessel coronary calcification.  80% eccentric mid RCA stenosis.  80% ostial obtuse marginal #1 stenosis.  Moderate diffuse calcified LAD disease less than 50%.  Normal left ventricular systolic function and hemodynamics  Normal pulmonary pressures and pulmonary capillary wedge.  Moderate to severe aortic stenosis with aortic valve area 1.04.  Echo 10/10/15: Study Conclusions  - Left ventricle: The cavity size was normal. Wall thickness  was increased in  a pattern of mild LVH. Systolic function was normal. The estimated ejection fraction was in the range of 60% to 65%. Wall motion was normal; there were no regional wall motion abnormalities. Doppler parameters are consistent with abnormal left ventricular relaxation (grade 1 diastolic dysfunction). Doppler parameters are consistent with indeterminate ventricular filling pressure. - Aortic valve: Valve mobility was restricted. There was moderate stenosis. Peak velocity (S): 387.9 cm/s. Mean gradient (S): 25 mm Hg. Valve area (VTI): 1.04 cm^2. Valve area (Vmax): 1 cm^2. Valve area (Vmean): 1.22 cm^2. - Mitral valve: There was trivial regurgitation. - Left atrium: The atrium was mildly dilated. - Right ventricle: The cavity size was normal. Wall thickness was normal. Systolic function was normal. - Atrial septum: No defect or patent foramen ovale was identified by color flow Doppler. - Tricuspid valve: There was mild regurgitation. - Inferior vena cava: The vessel was normal in size. The respirophasic diameter changes were in the normal range (= 50%), consistent with normal central venous pressure.   ECG:  afib with RVR, HR 126 and LBBB   Radiology:  Ct Coronary Morph W/cta Cor W/score W/ca W/cm &/or Wo/cm  Addendum Date: 03/19/2016   ADDENDUM REPORT: 03/19/2016 17:46 CLINICAL DATA:  82 year old male with severe aortic stenosis. EXAM: Cardiac TAVR CT TECHNIQUE: The patient was scanned on a Philips 256 scanner. A 120 kV retrospective scan was triggered in the descending thoracic aorta at 111 HU's. Gantry rotation speed was 270 msecs and collimation was .9 mm. 10 mg of iv Metoprolol and no nitro were given. The 3D data set was reconstructed in 5% intervals of the R-R cycle. Systolic and diastolic phases were analyzed on a dedicated work station using MPR, MIP and VRT modes. The patient received 80 cc of contrast. FINDINGS: Aortic Valve: Trileaflet, severely thickened  and calcified with severely restricted leaflet opening. There is minimal calcification extending into the LVOT. Aorta:  Normal size, moderate diffuse calcifications, no dissection. Sinotubular Junction:  31 x 31 mm Ascending Thoracic Aorta:  35 x 34 mm Aortic Arch:  25 x 25 mm Descending Thoracic Aorta:  23 x 23 mm Sinus of Valsalva Measurements: Non-coronary:  35 mm Right -coronary:  36 mm Left -coronary:  36 mm Coronary Artery Height above Annulus: Left Main:  11 mm Right Coronary:  12 mm Virtual Basal Annulus Measurements: Maximum/Minimum Diameter:  27 x 26 mm Perimeter:  82 mm Area:  504 mm2 Optimum Fluoroscopic Angle for Delivery:  LAO 8 CAU 7 Other findings: A large left atrial appendage without evidence of a thrombus. Normal pulmonary vein drainage into the left atrium. Dilated pulmonary artery measuring 34 x 25 mm. IMPRESSION: 1. Trileaflet, severely thickened and calcified aortic valve with severely restricted leaflet opening and annular measurements suitable for delivery of a 26 mm Edwards-SAPIEN 3 valve. 2. Sufficient annulus to coronary distance. 3. Optimum Fluoroscopic Angle for Delivery:  LAO 8 CAU 7. 4. A large left atrial appendage without evidence of a thrombus 5. Dilated pulmonary artery measuring 34 x 25 mm Ena Dawley Electronically Signed   By: Ena Dawley   On: 03/19/2016 17:46   Result Date: 03/19/2016 EXAM: OVER-READ INTERPRETATION  CT CHEST The following report is an over-read performed by radiologist Dr. Rebekah [facility] on 03/19/2016. This over-read does not include interpretation of cardiac or coronary anatomy or pathology. The coronary calcium score/coronary CTA interpretation by the cardiologist is attached. COMPARISON:  None. FINDINGS: Full description of extracardiac findings will be dictated  separately a under requisition for contemporaneously obtained CTA of the chest, abdomen and pelvis 03/19/2016. IMPRESSION: Please see separate dictation for  contemporaneously obtained CTA of the chest, abdomen and pelvis 03/19/2016 for full description of extracardiac findings. Electronically Signed: By: Vinnie Langton M.D. On: 03/19/2016 16:53   Ct Angio Chest Aorta W/cm &/or Wo/cm  Result Date: 03/20/2016 CLINICAL DATA:  82 year old male with history of severe aortic stenosis. Preprocedural study prior to potential transcatheter aortic valve replacement (TAVR). Chest tightness and sensation of racing heart beat. Fatigue yesterday. EXAM: CT ANGIOGRAPHY CHEST, ABDOMEN AND PELVIS TECHNIQUE: Multidetector CT imaging through the chest, abdomen and pelvis was performed using the standard protocol during bolus administration of intravenous contrast. Multiplanar reconstructed images and MIPs were obtained and reviewed to evaluate the vascular anatomy. CONTRAST:  70 mL of Isovue 370. COMPARISON:  No priors. FINDINGS: CTA CHEST FINDINGS Cardiovascular: Heart size is mildly enlarged. There is no significant pericardial fluid, thickening or pericardial calcification. There is aortic atherosclerosis, as well as atherosclerosis of the great vessels of the mediastinum and the coronary arteries, including calcified atherosclerotic plaque in the left main, left anterior descending, left circumflex and right coronary arteries. Severe thickening calcification of the aortic valve. Mediastinum/Lymph Nodes: No pathologically enlarged mediastinal or hilar lymph nodes. Densely calcified right hilar lymph node incidentally noted. Esophagus is unremarkable in appearance. No axillary lymphadenopathy. Lungs/Pleura: There are several pulmonary nodules scattered throughout the lungs bilaterally, the largest of which measures up to 8 x 6 mm (mean diameter of 7 mm) in the subpleural aspect of the right lower lobe (image 41 of series 407). These are nonspecific, but most of these are subpleural in location, likely to represent subpleural lymph nodes. Areas of scarring and/or subsegmental  atelectasis are noted in the lung bases bilaterally, particularly in the right lower lobe. No acute consolidative airspace disease. No pleural effusions. Small right-sided Bochdalek's hernia incidentally noted. Musculoskeletal/Soft Tissues: There are no aggressive appearing lytic or blastic lesions noted in the visualized portions of the skeleton. CTA ABDOMEN AND PELVIS FINDINGS Hepatobiliary: Several sub cm well-defined low-attenuation lesions are noted in the liver, largest of which measures up to 9 mm in segment 4A. These are too small to definitively characterize, but are favored to represent tiny cysts. No other definite suspicious appearing hepatic lesions are noted. There is no intra or extrahepatic biliary ductal dilatation. Gallbladder is normal in appearance. Pancreas: No pancreatic mass. No pancreatic ductal dilatation. No pancreatic or peripancreatic fluid or inflammatory changes. Spleen: Unremarkable. Adrenals/Urinary Tract: Sub cm low-attenuation lesions in the left kidney are too small to definitively characterize, but are statistically likely tiny cysts. Right kidney and bilateral adrenal glands are normal in appearance. No hydroureteronephrosis. Urinary bladder is normal in appearance. Stomach/Bowel: The appearance of the stomach is normal. There is no pathologic dilatation of small bowel or colon. The appendix is not confidently identified and may be surgically absent. Regardless, there are no inflammatory changes noted adjacent to the cecum to suggest the presence of an acute appendicitis at this time. Vascular/Lymphatic: Vascular findings and measurements pertinent to potential TAVR procedure, as detailed below. No aneurysm or dissection identified in the abdominal or pelvic vasculature. The celiac axis, superior mesenteric artery and inferior mesenteric artery all appear widely patent, without hemodynamically significant stenosis. There are single renal arteries bilaterally which are both widely  patent. There is complete inclusion of the right internal iliac artery proximally, which is subsequently reconstituted distally from collateral pathways. No lymphadenopathy noted in the abdomen or  pelvis. Reproductive: Fiducial markers associated with the prostate gland. Prostate gland and seminal vesicles are otherwise unremarkable in appearance. Other: Low-attenuation material in the right inguinal region, presumably a Prolene plug from prior herniorrhaphy. No significant volume of ascites. No pneumoperitoneum. Musculoskeletal: Bilateral pars defects at L5 with 14 mm of anterolisthesis of L5 upon S1. There are no aggressive appearing lytic or blastic lesions noted in the visualized portions of the skeleton. VASCULAR MEASUREMENTS PERTINENT TO TAVR: AORTA: Minimal Aortic Diameter -  13 x 14 mm Severity of Aortic Calcification -  moderate RIGHT PELVIS: Right Common Iliac Artery - Minimal Diameter - 7.7 x 6.4 mm Tortuosity - mild Calcification - moderate Right External Iliac Artery - Minimal Diameter - 7.2 x 7.1 mm Tortuosity - severe Calcification - none Right Common Femoral Artery - Minimal Diameter - 6.9 x 8.0 mm Tortuosity - mild Calcification - none LEFT PELVIS: Left Common Iliac Artery - Minimal Diameter - 6.1 x 7.3 mm Tortuosity - moderate Calcification - mild Left External Iliac Artery - Minimal Diameter - 6.8 x 6.9 mm Tortuosity - severe Calcification - none Left Common Femoral Artery - Minimal Diameter - 6.5 x 7.9 mm Tortuosity - mild Calcification - mild Review of the MIP images confirms the above findings. IMPRESSION: 1. Vascular findings and measurements pertinent to potential TAVR procedure, as above. This patient appears to have suitable pelvic arterial access bilaterally from a size perspective, although there is extensive tortuosity bilaterally, particularly in the external iliac arteries, as detailed above. 2. Severe thickening calcification of the aortic valve, compatible with the clinical history  of severe aortic stenosis. 3. Aortic atherosclerosis, in addition to left main and 3 vessel coronary artery disease. 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 4. Mild cardiomegaly. 5. Multiple small pulmonary nodules scattered throughout the lungs bilaterally, measuring 7 mm or less in mean diameter. Many of these are subpleural in location, and are therefore favored to represent benign subpleural lymph nodes, however, these are all considered nonspecific. Non-contrast chest CT at 3-6 months is recommended. If the nodules are stable at time of repeat CT, then future CT at 18-24 months (from today's scan) is considered optional for low-risk patients, but is recommended for high-risk patients. This recommendation follows the consensus statement: Guidelines for Management of Incidental Pulmonary Nodules Detected on CT Images: From the Fleischner Society 2017; Radiology 2017; 284:228-243. 6. Additional incidental findings, as above. Electronically Signed   By: Vinnie Langton M.D.   On: 03/20/2016 10:19   Ct Angio Abd/pel W/ And/or W/o  Result Date: 03/20/2016 CLINICAL DATA:  82 year old male with history of severe aortic stenosis. Preprocedural study prior to potential transcatheter aortic valve replacement (TAVR). Chest tightness and sensation of racing heart beat. Fatigue yesterday. EXAM: CT ANGIOGRAPHY CHEST, ABDOMEN AND PELVIS TECHNIQUE: Multidetector CT imaging through the chest, abdomen and pelvis was performed using the standard protocol during bolus administration of intravenous contrast. Multiplanar reconstructed images and MIPs were obtained and reviewed to evaluate the vascular anatomy. CONTRAST:  70 mL of Isovue 370. COMPARISON:  No priors. FINDINGS: CTA CHEST FINDINGS Cardiovascular: Heart size is mildly enlarged. There is no significant pericardial fluid, thickening  or pericardial calcification. There is aortic atherosclerosis, as well as atherosclerosis of the great vessels of the mediastinum and the coronary arteries, including calcified atherosclerotic plaque in the left main, left anterior descending, left circumflex and right coronary arteries. Severe  thickening calcification of the aortic valve. Mediastinum/Lymph Nodes: No pathologically enlarged mediastinal or hilar lymph nodes. Densely calcified right hilar lymph node incidentally noted. Esophagus is unremarkable in appearance. No axillary lymphadenopathy. Lungs/Pleura: There are several pulmonary nodules scattered throughout the lungs bilaterally, the largest of which measures up to 8 x 6 mm (mean diameter of 7 mm) in the subpleural aspect of the right lower lobe (image 41 of series 407). These are nonspecific, but most of these are subpleural in location, likely to represent subpleural lymph nodes. Areas of scarring and/or subsegmental atelectasis are noted in the lung bases bilaterally, particularly in the right lower lobe. No acute consolidative airspace disease. No pleural effusions. Small right-sided Bochdalek's hernia incidentally noted. Musculoskeletal/Soft Tissues: There are no aggressive appearing lytic or blastic lesions noted in the visualized portions of the skeleton. CTA ABDOMEN AND PELVIS FINDINGS Hepatobiliary: Several sub cm well-defined low-attenuation lesions are noted in the liver, largest of which measures up to 9 mm in segment 4A. These are too small to definitively characterize, but are favored to represent tiny cysts. No other definite suspicious appearing hepatic lesions are noted. There is no intra or extrahepatic biliary ductal dilatation. Gallbladder is normal in appearance. Pancreas: No pancreatic mass. No pancreatic ductal dilatation. No pancreatic or peripancreatic fluid or inflammatory changes. Spleen: Unremarkable. Adrenals/Urinary Tract: Sub cm low-attenuation lesions in the left kidney  are too small to definitively characterize, but are statistically likely tiny cysts. Right kidney and bilateral adrenal glands are normal in appearance. No hydroureteronephrosis. Urinary bladder is normal in appearance. Stomach/Bowel: The appearance of the stomach is normal. There is no pathologic dilatation of small bowel or colon. The appendix is not confidently identified and may be surgically absent. Regardless, there are no inflammatory changes noted adjacent to the cecum to suggest the presence of an acute appendicitis at this time. Vascular/Lymphatic: Vascular findings and measurements pertinent to potential TAVR procedure, as detailed below. No aneurysm or dissection identified in the abdominal or pelvic vasculature. The celiac axis, superior mesenteric artery and inferior mesenteric artery all appear widely patent, without hemodynamically significant stenosis. There are single renal arteries bilaterally which are both widely patent. There is complete inclusion of the right internal iliac artery proximally, which is subsequently reconstituted distally from collateral pathways. No lymphadenopathy noted in the abdomen or pelvis. Reproductive: Fiducial markers associated with the prostate gland. Prostate gland and seminal vesicles are otherwise unremarkable in appearance. Other: Low-attenuation material in the right inguinal region, presumably a Prolene plug from prior herniorrhaphy. No significant volume of ascites. No pneumoperitoneum. Musculoskeletal: Bilateral pars defects at L5 with 14 mm of anterolisthesis of L5 upon S1. There are no aggressive appearing lytic or blastic lesions noted in the visualized portions of the skeleton. VASCULAR MEASUREMENTS PERTINENT TO TAVR: AORTA: Minimal Aortic Diameter -  13 x 14 mm Severity of Aortic Calcification -  moderate RIGHT PELVIS: Right Common Iliac Artery - Minimal Diameter - 7.7 x 6.4 mm Tortuosity - mild Calcification - moderate Right External Iliac Artery -  Minimal Diameter - 7.2 x 7.1 mm Tortuosity - severe Calcification - none Right Common Femoral Artery - Minimal Diameter - 6.9 x 8.0 mm Tortuosity - mild Calcification - none LEFT PELVIS: Left Common Iliac Artery - Minimal Diameter - 6.1 x 7.3 mm Tortuosity - moderate Calcification - mild Left External Iliac Artery - Minimal Diameter - 6.8 x 6.9 mm Tortuosity - severe Calcification - none Left Common Femoral Artery - Minimal Diameter - 6.5 x 7.9 mm Tortuosity -  mild Calcification - mild Review of the MIP images confirms the above findings. IMPRESSION: 1. Vascular findings and measurements pertinent to potential TAVR procedure, as above. This patient appears to have suitable pelvic arterial access bilaterally from a size perspective, although there is extensive tortuosity bilaterally, particularly in the external iliac arteries, as detailed above. 2. Severe thickening calcification of the aortic valve, compatible with the clinical history of severe aortic stenosis. 3. Aortic atherosclerosis, in addition to left main and 3 vessel coronary artery disease. 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 4. Mild cardiomegaly. 5. Multiple small pulmonary nodules scattered throughout the lungs bilaterally, measuring 7 mm or less in mean diameter. Many of these are subpleural in location, and are therefore favored to represent benign subpleural lymph nodes, however, these are all considered nonspecific. Non-contrast chest CT at 3-6 months is recommended. If the nodules are stable at time of repeat CT, then future CT at 18-24 months (from today's scan) is considered optional for low-risk patients, but is recommended for high-risk patients. This recommendation follows the consensus statement: Guidelines for Management of Incidental Pulmonary Nodules Detected on CT Images: From the Fleischner Society  2017; Radiology 2017; 284:228-243. 6. Additional incidental findings, as above. Electronically Signed   By: Vinnie Langton M.D.   On: 03/20/2016 10:19    ASSESSMENT AND PLAN:    Principal Problem:   PAF (paroxysmal atrial fibrillation) (HCC) Active Problems:   Acute on chronic diastolic heart failure (HCC)   Atrial fibrillation with RVR (HCC)   Atrial fibrillation with rapid ventricular response (HCC)   Hypertensive heart disease with heart failure ([place])   Pure hypercholesterolemia   Tachy-brady syndrome (HCC)  Tachy-Brady Syndrome: patient admitted from office with highly symptomatic afib with RVR. He then spontaneously converted shortly after admission through ER. Tele shows sinus rhythm intermittently with atrial fibrillation with RVR. Rate in AF is 100-110s.  Post-conversion pauses up to 4 seconds, followed by bradycardia in the 30s and then sinus rhythm. -- Currently on amio 400mg  BID  -- Continue Eliquis 2.5 mg BID for CHADSVASC of at least 4 ( HTN, age, vasc dz).  -- Plan will be for PPM this afternoon. NPO until then   Moderate to severe aortic stenosis:  Mean gradient 25 mmHg by echo 10/2015.  Valve area 1.04 cm^2 by cath 01/2016.  He is leaning towards TAVR.  CAD:  Ryan [name] has 80% lesions in the mid RCA and OM1 that are technically challenging to open via PCI, though not impossible. He is leaning towards PCI and TAVR  Hypertensive heart disease: BP with moderate control.    Signed: Angelena Form, PA-C 03/21/2016 11:23 AM  Pager [phone]  EP Attending  Patient seen and examined. He has symptomatic tachy-brady syndrome and pauses of 4 seconds. He has atrial fib with a RVR. He has tight AS. He has LBBB. He needs his valve replaced but I think PPM insertion is reasonable. I have discussed the risks/benefits/goals/expectations of PPM insertion with the patient and his family and they wish to proceed.  Mikle Bosworth.D.

## 2016-03-21 NOTE — Progress Notes (Signed)
Pt's pacemaker dressing in clean dry and intact. Ortho tech paged to apply sling to patient's left arm.

## 2016-03-21 NOTE — Progress Notes (Signed)
Patient Name: Ryan [name] Date of Encounter: 03/21/2016  Primary Cardiologist: Ena Dawley, MD  Hospital Problem List     Principal Problem:   PAF (paroxysmal atrial fibrillation) [facility]) Active Problems:   Acute on chronic diastolic heart failure (HCC)   Atrial fibrillation with RVR (HCC)   Atrial fibrillation with rapid ventricular response ([place])   Hypertensive heart disease with heart failure ([place])   Pure hypercholesterolemia    Subjective   Feeling well.  Does endorse some lightheadedness.  Denies shortness of breath or chest pain.   Inpatient Medications    Scheduled Meds: . amiodarone  400 mg Oral BID  . apixaban  2.5 mg Oral BID  . aspirin EC  81 mg Oral Daily  . cholecalciferol  1,000 Units Oral Daily  . diltiazem  10 mg Intravenous Once  . latanoprost  1 drop Both Eyes QHS  . lisinopril  20 mg Oral Daily  . metoprolol tartrate  25 mg Oral Once  . pantoprazole  40 mg Oral Daily  . sodium chloride flush  3 mL Intravenous Q12H   Continuous Infusions: . diltiazem (CARDIZEM) infusion     PRN Meds: sodium chloride, acetaminophen, metoprolol, ondansetron (ZOFRAN) IV, sodium chloride flush   Vital Signs    Vitals:   03/20/16 2353 03/21/16 0459 03/21/16 0743 03/21/16 0847  BP:    (!) 159/72  Pulse:   (!) 43 74  Resp:   (!) 22 16  Temp: 97.8 F (36.6 C) 97.8 F (36.6 C)  97.7 F (36.5 C)  TempSrc: Oral Oral  Oral  SpO2:   93% 97%  Weight:  58.9 kg (129 lb 12.8 oz)    Height:        Intake/Output Summary (Last 24 hours) at 03/21/16 0912 Last data filed at 03/21/16 0858  Gross per 24 hour  Intake              560 ml  Output              770 ml  Net             -210 ml   Filed Weights   03/19/16 0410 03/20/16 0400 03/21/16 0459  Weight: 60.5 kg (133 lb 4.8 oz) 59 kg (130 lb 1.6 oz) 58.9 kg (129 lb 12.8 oz)    Physical Exam    GEN: Well nourished, well developed, in no acute distress.  HEENT: Grossly normal.  Neck: Supple, no JVD,  carotid bruits, or masses. Cardiac: Irregularly irregular. , III/VI late-peaking systolic murmur at LUSB, no rubs, or gallops. No clubbing, cyanosis, edema.  Radials/DP/PT 2+ and equal bilaterally.  Respiratory:  Respirations regular and unlabored, clear to auscultation bilaterally. GI: Soft, nontender, nondistended, BS + x 4. MS: no deformity or atrophy. Skin: warm and dry, no rash. Neuro:  Strength and sensation are intact. Psych: AAOx3.  Normal affect.  Labs    CBC  Recent Labs  03/18/16 1222 03/20/16 0422 03/21/16 0544  WBC 4.9 3.6* 4.4  NEUTROABS 3.2  --   --   HGB 15.7 14.2 15.9  HCT 45.6 42.2 47.4  MCV 90.5 90.6 90.8  PLT 212 183 366   Basic Metabolic Panel  Recent Labs  03/20/16 0422 03/21/16 0544  NA 140 139  K 3.9 3.9  CL 112* 110  CO2 21* 22  GLUCOSE 87 83  BUN 13 13  CREATININE 1.01 1.07  CALCIUM 9.1 9.2   Liver Function Tests  Recent Labs  03/18/16  1222  AST 26  ALT 19  ALKPHOS 64  BILITOT 0.6  PROT 6.4*  ALBUMIN 4.0   No results for input(s): LIPASE, AMYLASE in the last 72 hours. Cardiac Enzymes  Recent Labs  03/18/16 1222 03/18/16 1958  TROPONINI <0.03 0.03*   BNP Invalid input(s): POCBNP D-Dimer No results for input(s): DDIMER in the last 72 hours. Hemoglobin A1C  Recent Labs  03/18/16 1958  HGBA1C 5.9*   Fasting Lipid Panel  Recent Labs  03/20/16 0422  CHOL 147  HDL 61  LDLCALC 75  TRIG 54  CHOLHDL 2.4   Thyroid Function Tests  Recent Labs  03/18/16 1958  TSH 2.194    Telemetry    Sinus rhythm intermittently with atrial fibrillation with RVR.  Sinus rhythm.  Rate in AF is 100-110s.  Post-conversion pauses up to 4 seconds, followed by bradycardia in the 30s and then sinus rhythm.  - Personally Reviewed  ECG    03/19/16: Atrial fibrillation.  Rate 126.  LBBB  - Personally Reviewed  Radiology    Ct Coronary Morph W/cta Cor W/score W/ca W/cm &/or Wo/cm  Addendum Date: 03/19/2016   ADDENDUM REPORT:  03/19/2016 17:46 CLINICAL DATA:  82 year old male with severe aortic stenosis. EXAM: Cardiac TAVR CT TECHNIQUE: The patient was scanned on a Philips 256 scanner. A 120 kV retrospective scan was triggered in the descending thoracic aorta at 111 HU's. Gantry rotation speed was 270 msecs and collimation was .9 mm. 10 mg of iv Metoprolol and no nitro were given. The 3D data set was reconstructed in 5% intervals of the R-R cycle. Systolic and diastolic phases were analyzed on a dedicated work station using MPR, MIP and VRT modes. The patient received 80 cc of contrast. FINDINGS: Aortic Valve: Trileaflet, severely thickened and calcified with severely restricted leaflet opening. There is minimal calcification extending into the LVOT. Aorta:  Normal size, moderate diffuse calcifications, no dissection. Sinotubular Junction:  31 x 31 mm Ascending Thoracic Aorta:  35 x 34 mm Aortic Arch:  25 x 25 mm Descending Thoracic Aorta:  23 x 23 mm Sinus of Valsalva Measurements: Non-coronary:  35 mm Right -coronary:  36 mm Left -coronary:  36 mm Coronary Artery Height above Annulus: Left Main:  11 mm Right Coronary:  12 mm Virtual Basal Annulus Measurements: Maximum/Minimum Diameter:  27 x 26 mm Perimeter:  82 mm Area:  504 mm2 Optimum Fluoroscopic Angle for Delivery:  LAO 8 CAU 7 Other findings: A large left atrial appendage without evidence of a thrombus. Normal pulmonary vein drainage into the left atrium. Dilated pulmonary artery measuring 34 x 25 mm. IMPRESSION: 1. Trileaflet, severely thickened and calcified aortic valve with severely restricted leaflet opening and annular measurements suitable for delivery of a 26 mm Edwards-SAPIEN 3 valve. 2. Sufficient annulus to coronary distance. 3. Optimum Fluoroscopic Angle for Delivery:  LAO 8 CAU 7. 4. A large left atrial appendage without evidence of a thrombus 5. Dilated pulmonary artery measuring 34 x 25 mm Ena Dawley Electronically Signed   By: Ena Dawley   On: 03/19/2016  17:46   Result Date: 03/19/2016 EXAM: OVER-READ INTERPRETATION  CT CHEST The following report is an over-read performed by radiologist Dr. Rebekah Chesterfield [facility] on 03/19/2016. This over-read does not include interpretation of cardiac or coronary anatomy or pathology. The coronary calcium score/coronary CTA interpretation by the cardiologist is attached. COMPARISON:  None. FINDINGS: Full description of extracardiac findings will be dictated separately a under requisition for contemporaneously obtained CTA  of the chest, abdomen and pelvis 03/19/2016. IMPRESSION: Please see separate dictation for contemporaneously obtained CTA of the chest, abdomen and pelvis 03/19/2016 for full description of extracardiac findings. Electronically Signed: By: Vinnie Langton M.D. On: 03/19/2016 16:53   Ct Angio Chest Aorta W/cm &/or Wo/cm  Result Date: 03/20/2016 CLINICAL DATA:  82 year old male with history of severe aortic stenosis. Preprocedural study prior to potential transcatheter aortic valve replacement (TAVR). Chest tightness and sensation of racing heart beat. Fatigue yesterday. EXAM: CT ANGIOGRAPHY CHEST, ABDOMEN AND PELVIS TECHNIQUE: Multidetector CT imaging through the chest, abdomen and pelvis was performed using the standard protocol during bolus administration of intravenous contrast. Multiplanar reconstructed images and MIPs were obtained and reviewed to evaluate the vascular anatomy. CONTRAST:  70 mL of Isovue 370. COMPARISON:  No priors. FINDINGS: CTA CHEST FINDINGS Cardiovascular: Heart size is mildly enlarged. There is no significant pericardial fluid, thickening or pericardial calcification. There is aortic atherosclerosis, as well as atherosclerosis of the great vessels of the mediastinum and the coronary arteries, including calcified atherosclerotic plaque in the left main, left anterior descending, left circumflex and right coronary arteries. Severe thickening calcification of the  aortic valve. Mediastinum/Lymph Nodes: No pathologically enlarged mediastinal or hilar lymph nodes. Densely calcified right hilar lymph node incidentally noted. Esophagus is unremarkable in appearance. No axillary lymphadenopathy. Lungs/Pleura: There are several pulmonary nodules scattered throughout the lungs bilaterally, the largest of which measures up to 8 x 6 mm (mean diameter of 7 mm) in the subpleural aspect of the right lower lobe (image 41 of series 407). These are nonspecific, but most of these are subpleural in location, likely to represent subpleural lymph nodes. Areas of scarring and/or subsegmental atelectasis are noted in the lung bases bilaterally, particularly in the right lower lobe. No acute consolidative airspace disease. No pleural effusions. Small right-sided Bochdalek's hernia incidentally noted. Musculoskeletal/Soft Tissues: There are no aggressive appearing lytic or blastic lesions noted in the visualized portions of the skeleton. CTA ABDOMEN AND PELVIS FINDINGS Hepatobiliary: Several sub cm well-defined low-attenuation lesions are noted in the liver, largest of which measures up to 9 mm in segment 4A. These are too small to definitively characterize, but are favored to represent tiny cysts. No other definite suspicious appearing hepatic lesions are noted. There is no intra or extrahepatic biliary ductal dilatation. Gallbladder is normal in appearance. Pancreas: No pancreatic mass. No pancreatic ductal dilatation. No pancreatic or peripancreatic fluid or inflammatory changes. Spleen: Unremarkable. Adrenals/Urinary Tract: Sub cm low-attenuation lesions in the left kidney are too small to definitively characterize, but are statistically likely tiny cysts. Right kidney and bilateral adrenal glands are normal in appearance. No hydroureteronephrosis. Urinary bladder is normal in appearance. Stomach/Bowel: The appearance of the stomach is normal. There is no pathologic dilatation of small bowel or  colon. The appendix is not confidently identified and may be surgically absent. Regardless, there are no inflammatory changes noted adjacent to the cecum to suggest the presence of an acute appendicitis at this time. Vascular/Lymphatic: Vascular findings and measurements pertinent to potential TAVR procedure, as detailed below. No aneurysm or dissection identified in the abdominal or pelvic vasculature. The celiac axis, superior mesenteric artery and inferior mesenteric artery all appear widely patent, without hemodynamically significant stenosis. There are single renal arteries bilaterally which are both widely patent. There is complete inclusion of the right internal iliac artery proximally, which is subsequently reconstituted distally from collateral pathways. No lymphadenopathy noted in the abdomen or pelvis. Reproductive: Fiducial markers associated with the prostate gland.  Prostate gland and seminal vesicles are otherwise unremarkable in appearance. Other: Low-attenuation material in the right inguinal region, presumably a Prolene plug from prior herniorrhaphy. No significant volume of ascites. No pneumoperitoneum. Musculoskeletal: Bilateral pars defects at L5 with 14 mm of anterolisthesis of L5 upon S1. There are no aggressive appearing lytic or blastic lesions noted in the visualized portions of the skeleton. VASCULAR MEASUREMENTS PERTINENT TO TAVR: AORTA: Minimal Aortic Diameter -  13 x 14 mm Severity of Aortic Calcification -  moderate RIGHT PELVIS: Right Common Iliac Artery - Minimal Diameter - 7.7 x 6.4 mm Tortuosity - mild Calcification - moderate Right External Iliac Artery - Minimal Diameter - 7.2 x 7.1 mm Tortuosity - severe Calcification - none Right Common Femoral Artery - Minimal Diameter - 6.9 x 8.0 mm Tortuosity - mild Calcification - none LEFT PELVIS: Left Common Iliac Artery - Minimal Diameter - 6.1 x 7.3 mm Tortuosity - moderate Calcification - mild Left External Iliac Artery - Minimal  Diameter - 6.8 x 6.9 mm Tortuosity - severe Calcification - none Left Common Femoral Artery - Minimal Diameter - 6.5 x 7.9 mm Tortuosity - mild Calcification - mild Review of the MIP images confirms the above findings. IMPRESSION: 1. Vascular findings and measurements pertinent to potential TAVR procedure, as above. This patient appears to have suitable pelvic arterial access bilaterally from a size perspective, although there is extensive tortuosity bilaterally, particularly in the external iliac arteries, as detailed above. 2. Severe thickening calcification of the aortic valve, compatible with the clinical history of severe aortic stenosis. 3. Aortic atherosclerosis, in addition to left main and 3 vessel coronary artery disease. 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 4. Mild cardiomegaly. 5. Multiple small pulmonary nodules scattered throughout the lungs bilaterally, measuring 7 mm or less in mean diameter. Many of these are subpleural in location, and are therefore favored to represent benign subpleural lymph nodes, however, these are all considered nonspecific. Non-contrast chest CT at 3-6 months is recommended. If the nodules are stable at time of repeat CT, then future CT at 18-24 months (from today's scan) is considered optional for low-risk patients, but is recommended for high-risk patients. This recommendation follows the consensus statement: Guidelines for Management of Incidental Pulmonary Nodules Detected on CT Images: From the Fleischner Society 2017; Radiology 2017; 284:228-243. 6. Additional incidental findings, as above. Electronically Signed   By: Vinnie Langton M.D.   On: 03/20/2016 10:19   Ct Angio Abd/pel W/ And/or W/o  Result Date: 03/20/2016 CLINICAL DATA:  82 year old male with history of severe aortic stenosis. Preprocedural study prior to  potential transcatheter aortic valve replacement (TAVR). Chest tightness and sensation of racing heart beat. Fatigue yesterday. EXAM: CT ANGIOGRAPHY CHEST, ABDOMEN AND PELVIS TECHNIQUE: Multidetector CT imaging through the chest, abdomen and pelvis was performed using the standard protocol during bolus administration of intravenous contrast. Multiplanar reconstructed images and MIPs were obtained and reviewed to evaluate the vascular anatomy. CONTRAST:  70 mL of Isovue 370. COMPARISON:  No priors. FINDINGS: CTA CHEST FINDINGS Cardiovascular: Heart size is mildly enlarged. There is no significant pericardial fluid, thickening or pericardial calcification. There is aortic atherosclerosis, as well as atherosclerosis of the great vessels of the mediastinum and the coronary arteries, including calcified atherosclerotic plaque in the left main, left anterior descending, left circumflex and right coronary arteries. Severe thickening calcification of the aortic valve. Mediastinum/Lymph Nodes: No  pathologically enlarged mediastinal or hilar lymph nodes. Densely calcified right hilar lymph node incidentally noted. Esophagus is unremarkable in appearance. No axillary lymphadenopathy. Lungs/Pleura: There are several pulmonary nodules scattered throughout the lungs bilaterally, the largest of which measures up to 8 x 6 mm (mean diameter of 7 mm) in the subpleural aspect of the right lower lobe (image 41 of series 407). These are nonspecific, but most of these are subpleural in location, likely to represent subpleural lymph nodes. Areas of scarring and/or subsegmental atelectasis are noted in the lung bases bilaterally, particularly in the right lower lobe. No acute consolidative airspace disease. No pleural effusions. Small right-sided Bochdalek's hernia incidentally noted. Musculoskeletal/Soft Tissues: There are no aggressive appearing lytic or blastic lesions noted in the visualized portions of the skeleton. CTA ABDOMEN AND  PELVIS FINDINGS Hepatobiliary: Several sub cm well-defined low-attenuation lesions are noted in the liver, largest of which measures up to 9 mm in segment 4A. These are too small to definitively characterize, but are favored to represent tiny cysts. No other definite suspicious appearing hepatic lesions are noted. There is no intra or extrahepatic biliary ductal dilatation. Gallbladder is normal in appearance. Pancreas: No pancreatic mass. No pancreatic ductal dilatation. No pancreatic or peripancreatic fluid or inflammatory changes. Spleen: Unremarkable. Adrenals/Urinary Tract: Sub cm low-attenuation lesions in the left kidney are too small to definitively characterize, but are statistically likely tiny cysts. Right kidney and bilateral adrenal glands are normal in appearance. No hydroureteronephrosis. Urinary bladder is normal in appearance. Stomach/Bowel: The appearance of the stomach is normal. There is no pathologic dilatation of small bowel or colon. The appendix is not confidently identified and may be surgically absent. Regardless, there are no inflammatory changes noted adjacent to the cecum to suggest the presence of an acute appendicitis at this time. Vascular/Lymphatic: Vascular findings and measurements pertinent to potential TAVR procedure, as detailed below. No aneurysm or dissection identified in the abdominal or pelvic vasculature. The celiac axis, superior mesenteric artery and inferior mesenteric artery all appear widely patent, without hemodynamically significant stenosis. There are single renal arteries bilaterally which are both widely patent. There is complete inclusion of the right internal iliac artery proximally, which is subsequently reconstituted distally from collateral pathways. No lymphadenopathy noted in the abdomen or pelvis. Reproductive: Fiducial markers associated with the prostate gland. Prostate gland and seminal vesicles are otherwise unremarkable in appearance. Other:  Low-attenuation material in the right inguinal region, presumably a Prolene plug from prior herniorrhaphy. No significant volume of ascites. No pneumoperitoneum. Musculoskeletal: Bilateral pars defects at L5 with 14 mm of anterolisthesis of L5 upon S1. There are no aggressive appearing lytic or blastic lesions noted in the visualized portions of the skeleton. VASCULAR MEASUREMENTS PERTINENT TO TAVR: AORTA: Minimal Aortic Diameter -  13 x 14 mm Severity of Aortic Calcification -  moderate RIGHT PELVIS: Right Common Iliac Artery - Minimal Diameter - 7.7 x 6.4 mm Tortuosity - mild Calcification - moderate Right External Iliac Artery - Minimal Diameter - 7.2 x 7.1 mm Tortuosity - severe Calcification - none Right Common Femoral Artery - Minimal Diameter - 6.9 x 8.0 mm Tortuosity - mild Calcification - none LEFT PELVIS: Left Common Iliac Artery - Minimal Diameter - 6.1 x 7.3 mm Tortuosity - moderate Calcification - mild Left External Iliac Artery - Minimal Diameter - 6.8 x 6.9 mm Tortuosity - severe Calcification - none Left Common Femoral Artery - Minimal Diameter - 6.5 x 7.9 mm Tortuosity - mild Calcification - mild Review of the MIP images  confirms the above findings. IMPRESSION: 1. Vascular findings and measurements pertinent to potential TAVR procedure, as above. This patient appears to have suitable pelvic arterial access bilaterally from a size perspective, although there is extensive tortuosity bilaterally, particularly in the external iliac arteries, as detailed above. 2. Severe thickening calcification of the aortic valve, compatible with the clinical history of severe aortic stenosis. 3. Aortic atherosclerosis, in addition to left main and 3 vessel coronary artery disease. 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 4. Mild cardiomegaly. 5. Multiple small pulmonary nodules  scattered throughout the lungs bilaterally, measuring 7 mm or less in mean diameter. Many of these are subpleural in location, and are therefore favored to represent benign subpleural lymph nodes, however, these are all considered nonspecific. Non-contrast chest CT at 3-6 months is recommended. If the nodules are stable at time of repeat CT, then future CT at 18-24 months (from today's scan) is considered optional for low-risk patients, but is recommended for high-risk patients. This recommendation follows the consensus statement: Guidelines for Management of Incidental Pulmonary Nodules Detected on CT Images: From the Fleischner Society 2017; Radiology 2017; 284:228-243. 6. Additional incidental findings, as above. Electronically Signed   By: Vinnie Langton M.D.   On: 03/20/2016 10:19    Cardiac Studies   LHC 01/17/16:  Prox RCA to Mid RCA lesion, 20 %stenosed.  Mid RCA lesion, 80 %stenosed.  Ost 1st Mrg to 1st Mrg lesion, 80 %stenosed.  Mid Cx to Dist Cx lesion, 70 %stenosed.  Prox LAD to Dist LAD lesion, 40 %stenosed.  The left ventricular systolic function is normal.  LV end diastolic pressure is normal.  The left ventricular ejection fraction is 55-65% by visual estimate.  LV end diastolic pressure is normal.    There is three-vessel coronary calcification.  80% eccentric mid RCA stenosis.  80% ostial obtuse marginal #1 stenosis.  Moderate diffuse calcified LAD disease less than 50%.  Normal left ventricular systolic function and hemodynamics  Normal pulmonary pressures and pulmonary capillary wedge.  Moderate to severe aortic stenosis with aortic valve area 1.04.  Echo 10/10/15: Study Conclusions  - Left ventricle: The cavity size was normal. Wall thickness was   increased in a pattern of mild LVH. Systolic function was normal.   The estimated ejection fraction was in the range of 60% to 65%.   Wall motion was normal; there were no regional wall motion    abnormalities. Doppler parameters are consistent with abnormal   left ventricular relaxation (grade 1 diastolic dysfunction).   Doppler parameters are consistent with indeterminate ventricular   filling pressure. - Aortic valve: Valve mobility was restricted. There was moderate   stenosis. Peak velocity (S): 387.9 cm/s. Mean gradient (S): 25 mm   Hg. Valve area (VTI): 1.04 cm^2. Valve area (Vmax): 1 cm^2. Valve   area (Vmean): 1.22 cm^2. - Mitral valve: There was trivial regurgitation. - Left atrium: The atrium was mildly dilated. - Right ventricle: The cavity size was normal. Wall thickness was   normal. Systolic function was normal. - Atrial septum: No defect or patent foramen ovale was identified   by color flow Doppler. - Tricuspid valve: There was mild regurgitation. - Inferior vena cava: The vessel was normal in size. The   respirophasic diameter changes were in the normal range (= 50%),   consistent with normal central venous pressure.   Patient Profile  Mr. Bacha is an 63M with severe aortic stenosis, diffuse three vessel CAD, chronic diastolic heart failure and hypertensive heart disease noted to have atrial fibrillation with RVR when evaluated in the office for critical aortic stenosis and obstructive coronary disease.  Assessment & Plan    # Paroxysmal atrial fibrillation: # Tachy-brady syndrome:  Mr. Dimperio is in and out of atrial fibrillation.  Rates are in the 100-110s in AF and he is relatively asymtpomatic.  However, he has post-conversion pauses up to 4 seconds followed by bradycardia in the 30s.  He does endorses some lightheadedness, though it is unknown whether this coincides with his bradycardia.  He is leaning towards TAVR and PCI.  If he chooses TAVR, it is even more likely he will need a PPM.  It is unknown how his RCA disease may be contributing to the bradycardia.  Will ask EP to evaluate for PPM. Continue Eliquis, amiodarone and metoprolol for now.   #  Moderate to severe aortic stenosis:  Mean gradient 25 mmHg by echo 10/2015.  Valve area 1.04 cm^2 by cath 01/2016.  He is leaning towards TAVR.  # CAD:  Mr. Batta has 80% lesions in the mid RCA and OM1 that are technically challenging to open via PCI, though not impossible.   He is leaning towards PCI.Marland Kitchen  Continue aspirin and metoprolol as above.  He had memory impairment with statins in the past.  LDL 75 this admission.  Avoid nitrates given AS.  # Hypertensive heart disease: Blood pressure has been a little high on lisinopril and metoprolol.  However, given his AS and bradycardia, we will not titrate medications for now.    Signed, Skeet Latch, MD  03/21/2016, 9:12 AM

## 2016-03-21 NOTE — Interval H&P Note (Signed)
History and Physical Interval Note:  03/21/2016 3:57 PM  Ryan [name]  has presented today for surgery, with the diagnosis of tachy/brady  The various methods of treatment have been discussed with the patient and family. After consideration of risks, benefits and other options for treatment, the patient has consented to  Procedure(s): Pacemaker Implant (N/A) as a surgical intervention .  The patient's history has been reviewed, patient examined, no change in status, stable for surgery.  I have reviewed the patient's chart and labs.  Questions were answered to the patient's satisfaction.     Cristopher Peru

## 2016-03-21 NOTE — Progress Notes (Signed)
Orthopedic Tech Progress Note Patient Details:  Ryan [name] Sep 28, 1929 [id number]  Ortho Devices Type of Ortho Device: Arm sling Ortho Device/Splint Location: LUE Ortho Device/Splint Interventions: Ordered, Application   Braulio Bosch 03/21/2016, 8:56 PM

## 2016-03-21 NOTE — Consult Note (Signed)
ELECTROPHYSIOLOGY CONSULT NOTE   Patient ID: Ryan [name] MRN: [id number] DOB/AGE: 02-Aug-1929 82 y.o.  Admit date: 03/18/2016  Requesting Physician: Dr. Oval Linsey  Primary Physician:   Velna Hatchet, MD Primary Cardiologist:   Dr. Meda Coffee Reason for Consultation: tachy brady   HPI: Ryan [name] is a 82 y.o. male with a history of severe aortic stenosis, diffuse three vessel CAD, chronic diastolic heart failure, peripheral arterial disease with carotid endarterectomy bilaterally for severe stenosis in 1997, hyperlipidemia and hypertensive heart disease noted to have atrial fibrillation with RVR when evaluated in the office on 03/18/16 for critical aortic stenosis and obstructive coronary disease. He was directly admitted to [facility] from the office. Now noted on tele to go in and out of afib with up to 4 second post conversion pauses and sinus bradycardia. Electrophysiology consulted for help in management of tachy brady and possible PPM placement.   The patient had an echo performed 10/10/2015 which revealed moderate to severe aortic stenosis with peak velocity across the aortic valve measured 3.9 m/s corresponding to mean transvalvular gradient estimated 25 mmHg. He was seen in 01/2016 by Dr. Meda Coffee for evaluation of worsening DOE and fatigue. Cath on 01/17/16 by Dr. Tamala Julian confirmed the presence of aortic stenosis with mean transvalvular gradient measured 25.1 mmHg corresponding to aortic valve area calculated 1.04 cm and valve area index 0.62 cm/BSA.  Coronary angiography revealed multivessel coronary artery disease with discrete eccentric 80% stenosis of the mid right coronary artery, 80% ostial stenosis of a large first obtuse marginal branch of the left circumflex coronary artery, and diffuse nonobstructive disease in the left anterior descending coronary artery. Medical therapy was recommended. The patient was seen in follow-up by Dr. Meda Coffee and continued to complain of accelerating  symptoms of exertional shortness of breath and discomfort in the upper chest and neck. He was subsequently referred for surgical consultation.  He was seen by Dr. Roxy Manns on 03/12/16 for surgical consult for CABG/AVR vs PCI/TAVR. Patient reported he had palpitations associated with chest discomfort and severe dizzy spells with one near syncopal event.  Patient had a severe episode of chest pain and palpitations on 03/18/16 and was added onto Dr. Darliss Ridgel schedule in the office. He was found to be in afib with RVR HRs ~150. He was admitted from the office and started on IV amiodarone and IV heparin gtt with plans to switch to Eliquis 2.5mg  BID. While waiting to be admitted in the ER, he converted into NSR and felt much better. Patient had missed CT angio and cardiac CT that day, so it was decided to admit him for further observation and to get those studies. Fortunately, he did stay because he went back into afib with RVR the following AM. Dr. Roxy Manns saw him in the hospital and again discussed his options. Patient is leaning towards the less invasive approach of TAVR + PCI.   He is currently in bed and feeling fine. He is very relieved there is a solution to the problems he has been having. None currently. He has had episodes while here which seem to coincide with afib with rvr and long pauses. No CP or sob currently. No LE edema, orthopnea or PND. No dizziness right now.    Past Medical History:  Diagnosis Date  . Age-related macular degeneration   . Allergic rhinitis   . Aortic valve stenosis   . Carotid artery obstruction   . Chronic lower back pain   . Coronary arteriosclerosis in  native artery   . Diverticulitis of colon   . DJD (degenerative joint disease)   . Dysthymia   . History of elevated PSA 05/2009  . HLD (hyperlipidemia)   . Hypertension   . Nocturnal hypoxemia   . OSA (obstructive sleep apnea)    intolerant to CPAP  . Paroxysmal supraventricular tachycardia ([place])   . Primary  malignant neoplasm of bladder ([place])   . Prostate cancer [facility])      Past Surgical History:  Procedure Laterality Date  . ABDOMINAL SURGERY  1981   Meckles Diverticulum with volvulus  . APPENDECTOMY    . CARDIAC CATHETERIZATION N/A 01/17/2016   Procedure: Right/Left Heart Cath and Coronary Angiography;  Surgeon: Belva Crome, MD;  Location: [place] [facility];  Service: Cardiovascular;  Laterality: N/A;  . CAROTID ENDARTERECTOMY     bilateral  . HERNIA REPAIR Right 2009    Allergies  Allergen Reactions  . Hydrochlorothiazide Anaphylaxis  . Ace Inhibitors Cough  . Ciprofloxacin   . Losartan Cough  . Sulfa Antibiotics     I have reviewed the patient's current medications . amiodarone  400 mg Oral BID  . apixaban  2.5 mg Oral BID  . aspirin EC  81 mg Oral Daily  . cholecalciferol  1,000 Units Oral Daily  . diltiazem  10 mg Intravenous Once  . latanoprost  1 drop Both Eyes QHS  . lisinopril  20 mg Oral Daily  . metoprolol tartrate  25 mg Oral Once  . pantoprazole  40 mg Oral Daily  . sodium chloride flush  3 mL Intravenous Q12H   . diltiazem (CARDIZEM) infusion     sodium chloride, acetaminophen, metoprolol, ondansetron (ZOFRAN) IV, sodium chloride flush  Prior to Admission medications   Medication Sig Start Date End Date Taking? Authorizing Provider  amLODipine (NORVASC) 10 MG tablet Take 10 mg by mouth daily.   Yes Historical Provider, MD  aspirin 81 MG tablet Take 81 mg by mouth daily.   Yes Historical Provider, MD  cholecalciferol (VITAMIN D) 1000 units tablet Take 1,000 Units by mouth daily.   Yes Historical Provider, MD  Coenzyme Q10 (COQ-10 PO) Take 1 capsule by mouth daily.   Yes Historical Provider, MD  latanoprost (XALATAN) 0.005 % ophthalmic solution Place 1 drop into both eyes at bedtime.   Yes Historical Provider, MD  lisinopril (PRINIVIL,ZESTRIL) 20 MG tablet Take 20 mg by mouth daily.   Yes Historical Provider, MD  omeprazole (PRILOSEC) 20 MG capsule Take 20  mg by mouth daily.   Yes Historical Provider, MD     Social History   Social History  . Marital status: Married    Spouse name: N/A  . Number of children: N/A  . Years of education: N/A   Occupational History  . retired    Social History Main Topics  . Smoking status: Never Smoker  . Smokeless tobacco: Never Used  . Alcohol use No  . Drug use: No  . Sexual activity: Not on file   Other Topics Concern  . Not on file   Social History Narrative  . No narrative on file    Family Status  Relation Status  . Mother Deceased  . Father Deceased   Family History  Problem Relation Age of Onset  . Heart disease Mother   . Heart disease Father     ROS:  Full 14 point review of systems complete and found to be negative unless listed above.  Physical Exam: Blood pressure  105/86, pulse 73, temperature 97.7 F (36.5 C), temperature source Oral, resp. rate 15, height 5\' 7"  (1.702 m), weight 129 lb 12.8 oz (58.9 kg), SpO2 96 %.  General: Well developed, well nourished, male in no acute distress Head: Eyes PERRLA, No xanthomas.   Normocephalic and atraumatic, oropharynx without edema or exudate. Dentition:  Lungs: CTAB Heart: HRRR S1 S2, no rub/gallop, Heart regular rate and rhythm with S1, S2  murmur. pulses are 2+ extrem.   Neck: No carotid bruits. No lymphadenopathy. No  JVD. Abdomen: Bowel sounds present, abdomen soft and non-tender without masses or hernias noted. Msk:  No spine or cva tenderness. No weakness, no joint deformities or effusions. Extremities: No clubbing or cyanosis.  No LE edema.  Neuro: Alert and oriented X 3. No focal deficits noted. Psych:  Good affect, responds appropriately Skin: No rashes or lesions noted.  Labs:   Lab Results  Component Value Date   WBC 4.4 03/21/2016   HGB 15.9 03/21/2016   HCT 47.4 03/21/2016   MCV 90.8 03/21/2016   PLT 223 03/21/2016    Recent Labs  03/19/16 0420  INR 1.11    Recent Labs Lab 03/18/16 1222   03/21/16 0544  NA 140  < > 139  K 3.9  < > 3.9  CL 109  < > 110  CO2 21*  < > 22  BUN 20  < > 13  CREATININE 1.12  < > 1.07  CALCIUM 9.5  < > 9.2  PROT 6.4*  --   --   BILITOT 0.6  --   --   ALKPHOS 64  --   --   ALT 19  --   --   AST 26  --   --   GLUCOSE 93  < > 83  ALBUMIN 4.0  --   --   < > = values in this interval not displayed. No results found for: MG  Recent Labs  03/18/16 1222 03/18/16 1958  TROPONINI <0.03 0.03*   No results for input(s): TROPIPOC in the last 72 hours. No results found for: PROBNP Lab Results  Component Value Date   CHOL 147 03/20/2016   HDL 61 03/20/2016   LDLCALC 75 03/20/2016   TRIG 54 03/20/2016   No results found for: DDIMER No results found for: LIPASE, AMYLASE TSH  Date/Time Value Ref Range Status  03/18/2016 07:58 PM 2.194 0.350 - 4.500 uIU/mL Final    Comment:    Performed by a 3rd Generation assay with a functional sensitivity of <=0.01 uIU/mL.   No results found for: VITAMINB12, FOLATE, FERRITIN, TIBC, IRON, RETICCTPCT  LHC 01/17/16:  Prox RCA to Mid RCA lesion, 20 %stenosed.  Mid RCA lesion, 80 %stenosed.  Ost 1st Mrg to 1st Mrg lesion, 80 %stenosed.  Mid Cx to Dist Cx lesion, 70 %stenosed.  Prox LAD to Dist LAD lesion, 40 %stenosed.  The left ventricular systolic function is normal.  LV end diastolic pressure is normal.  The left ventricular ejection fraction is 55-65% by visual estimate.  LV end diastolic pressure is normal.   There is three-vessel coronary calcification.  80% eccentric mid RCA stenosis.  80% ostial obtuse marginal #1 stenosis.  Moderate diffuse calcified LAD disease less than 50%.  Normal left ventricular systolic function and hemodynamics  Normal pulmonary pressures and pulmonary capillary wedge.  Moderate to severe aortic stenosis with aortic valve area 1.04.  Echo 10/10/15: Study Conclusions  - Left ventricle: The cavity size was normal. Wall thickness  was increased in  a pattern of mild LVH. Systolic function was normal. The estimated ejection fraction was in the range of 60% to 65%. Wall motion was normal; there were no regional wall motion abnormalities. Doppler parameters are consistent with abnormal left ventricular relaxation (grade 1 diastolic dysfunction). Doppler parameters are consistent with indeterminate ventricular filling pressure. - Aortic valve: Valve mobility was restricted. There was moderate stenosis. Peak velocity (S): 387.9 cm/s. Mean gradient (S): 25 mm Hg. Valve area (VTI): 1.04 cm^2. Valve area (Vmax): 1 cm^2. Valve area (Vmean): 1.22 cm^2. - Mitral valve: There was trivial regurgitation. - Left atrium: The atrium was mildly dilated. - Right ventricle: The cavity size was normal. Wall thickness was normal. Systolic function was normal. - Atrial septum: No defect or patent foramen ovale was identified by color flow Doppler. - Tricuspid valve: There was mild regurgitation. - Inferior vena cava: The vessel was normal in size. The respirophasic diameter changes were in the normal range (= 50%), consistent with normal central venous pressure.   ECG:  afib with RVR, HR 126 and LBBB   Radiology:  Ct Coronary Morph W/cta Cor W/score W/ca W/cm &/or Wo/cm  Addendum Date: 03/19/2016   ADDENDUM REPORT: 03/19/2016 17:46 CLINICAL DATA:  82 year old male with severe aortic stenosis. EXAM: Cardiac TAVR CT TECHNIQUE: The patient was scanned on a Philips 256 scanner. A 120 kV retrospective scan was triggered in the descending thoracic aorta at 111 HU's. Gantry rotation speed was 270 msecs and collimation was .9 mm. 10 mg of iv Metoprolol and no nitro were given. The 3D data set was reconstructed in 5% intervals of the R-R cycle. Systolic and diastolic phases were analyzed on a dedicated work station using MPR, MIP and VRT modes. The patient received 80 cc of contrast. FINDINGS: Aortic Valve: Trileaflet, severely thickened  and calcified with severely restricted leaflet opening. There is minimal calcification extending into the LVOT. Aorta:  Normal size, moderate diffuse calcifications, no dissection. Sinotubular Junction:  31 x 31 mm Ascending Thoracic Aorta:  35 x 34 mm Aortic Arch:  25 x 25 mm Descending Thoracic Aorta:  23 x 23 mm Sinus of Valsalva Measurements: Non-coronary:  35 mm Right -coronary:  36 mm Left -coronary:  36 mm Coronary Artery Height above Annulus: Left Main:  11 mm Right Coronary:  12 mm Virtual Basal Annulus Measurements: Maximum/Minimum Diameter:  27 x 26 mm Perimeter:  82 mm Area:  504 mm2 Optimum Fluoroscopic Angle for Delivery:  LAO 8 CAU 7 Other findings: A large left atrial appendage without evidence of a thrombus. Normal pulmonary vein drainage into the left atrium. Dilated pulmonary artery measuring 34 x 25 mm. IMPRESSION: 1. Trileaflet, severely thickened and calcified aortic valve with severely restricted leaflet opening and annular measurements suitable for delivery of a 26 mm Edwards-SAPIEN 3 valve. 2. Sufficient annulus to coronary distance. 3. Optimum Fluoroscopic Angle for Delivery:  LAO 8 CAU 7. 4. A large left atrial appendage without evidence of a thrombus 5. Dilated pulmonary artery measuring 34 x 25 mm Ena Dawley Electronically Signed   By: Ena Dawley   On: 03/19/2016 17:46   Result Date: 03/19/2016 EXAM: OVER-READ INTERPRETATION  CT CHEST The following report is an over-read performed by radiologist Dr. Rebekah Chesterfield [facility] on 03/19/2016. This over-read does not include interpretation of cardiac or coronary anatomy or pathology. The coronary calcium score/coronary CTA interpretation by the cardiologist is attached. COMPARISON:  None. FINDINGS: Full description of extracardiac findings will be dictated  separately a under requisition for contemporaneously obtained CTA of the chest, abdomen and pelvis 03/19/2016. IMPRESSION: Please see separate dictation for  contemporaneously obtained CTA of the chest, abdomen and pelvis 03/19/2016 for full description of extracardiac findings. Electronically Signed: By: Vinnie Langton M.D. On: 03/19/2016 16:53   Ct Angio Chest Aorta W/cm &/or Wo/cm  Result Date: 03/20/2016 CLINICAL DATA:  82 year old male with history of severe aortic stenosis. Preprocedural study prior to potential transcatheter aortic valve replacement (TAVR). Chest tightness and sensation of racing heart beat. Fatigue yesterday. EXAM: CT ANGIOGRAPHY CHEST, ABDOMEN AND PELVIS TECHNIQUE: Multidetector CT imaging through the chest, abdomen and pelvis was performed using the standard protocol during bolus administration of intravenous contrast. Multiplanar reconstructed images and MIPs were obtained and reviewed to evaluate the vascular anatomy. CONTRAST:  70 mL of Isovue 370. COMPARISON:  No priors. FINDINGS: CTA CHEST FINDINGS Cardiovascular: Heart size is mildly enlarged. There is no significant pericardial fluid, thickening or pericardial calcification. There is aortic atherosclerosis, as well as atherosclerosis of the great vessels of the mediastinum and the coronary arteries, including calcified atherosclerotic plaque in the left main, left anterior descending, left circumflex and right coronary arteries. Severe thickening calcification of the aortic valve. Mediastinum/Lymph Nodes: No pathologically enlarged mediastinal or hilar lymph nodes. Densely calcified right hilar lymph node incidentally noted. Esophagus is unremarkable in appearance. No axillary lymphadenopathy. Lungs/Pleura: There are several pulmonary nodules scattered throughout the lungs bilaterally, the largest of which measures up to 8 x 6 mm (mean diameter of 7 mm) in the subpleural aspect of the right lower lobe (image 41 of series 407). These are nonspecific, but most of these are subpleural in location, likely to represent subpleural lymph nodes. Areas of scarring and/or subsegmental  atelectasis are noted in the lung bases bilaterally, particularly in the right lower lobe. No acute consolidative airspace disease. No pleural effusions. Small right-sided Bochdalek's hernia incidentally noted. Musculoskeletal/Soft Tissues: There are no aggressive appearing lytic or blastic lesions noted in the visualized portions of the skeleton. CTA ABDOMEN AND PELVIS FINDINGS Hepatobiliary: Several sub cm well-defined low-attenuation lesions are noted in the liver, largest of which measures up to 9 mm in segment 4A. These are too small to definitively characterize, but are favored to represent tiny cysts. No other definite suspicious appearing hepatic lesions are noted. There is no intra or extrahepatic biliary ductal dilatation. Gallbladder is normal in appearance. Pancreas: No pancreatic mass. No pancreatic ductal dilatation. No pancreatic or peripancreatic fluid or inflammatory changes. Spleen: Unremarkable. Adrenals/Urinary Tract: Sub cm low-attenuation lesions in the left kidney are too small to definitively characterize, but are statistically likely tiny cysts. Right kidney and bilateral adrenal glands are normal in appearance. No hydroureteronephrosis. Urinary bladder is normal in appearance. Stomach/Bowel: The appearance of the stomach is normal. There is no pathologic dilatation of small bowel or colon. The appendix is not confidently identified and may be surgically absent. Regardless, there are no inflammatory changes noted adjacent to the cecum to suggest the presence of an acute appendicitis at this time. Vascular/Lymphatic: Vascular findings and measurements pertinent to potential TAVR procedure, as detailed below. No aneurysm or dissection identified in the abdominal or pelvic vasculature. The celiac axis, superior mesenteric artery and inferior mesenteric artery all appear widely patent, without hemodynamically significant stenosis. There are single renal arteries bilaterally which are both widely  patent. There is complete inclusion of the right internal iliac artery proximally, which is subsequently reconstituted distally from collateral pathways. No lymphadenopathy noted in the abdomen or  pelvis. Reproductive: Fiducial markers associated with the prostate gland. Prostate gland and seminal vesicles are otherwise unremarkable in appearance. Other: Low-attenuation material in the right inguinal region, presumably a Prolene plug from prior herniorrhaphy. No significant volume of ascites. No pneumoperitoneum. Musculoskeletal: Bilateral pars defects at L5 with 14 mm of anterolisthesis of L5 upon S1. There are no aggressive appearing lytic or blastic lesions noted in the visualized portions of the skeleton. VASCULAR MEASUREMENTS PERTINENT TO TAVR: AORTA: Minimal Aortic Diameter -  13 x 14 mm Severity of Aortic Calcification -  moderate RIGHT PELVIS: Right Common Iliac Artery - Minimal Diameter - 7.7 x 6.4 mm Tortuosity - mild Calcification - moderate Right External Iliac Artery - Minimal Diameter - 7.2 x 7.1 mm Tortuosity - severe Calcification - none Right Common Femoral Artery - Minimal Diameter - 6.9 x 8.0 mm Tortuosity - mild Calcification - none LEFT PELVIS: Left Common Iliac Artery - Minimal Diameter - 6.1 x 7.3 mm Tortuosity - moderate Calcification - mild Left External Iliac Artery - Minimal Diameter - 6.8 x 6.9 mm Tortuosity - severe Calcification - none Left Common Femoral Artery - Minimal Diameter - 6.5 x 7.9 mm Tortuosity - mild Calcification - mild Review of the MIP images confirms the above findings. IMPRESSION: 1. Vascular findings and measurements pertinent to potential TAVR procedure, as above. This patient appears to have suitable pelvic arterial access bilaterally from a size perspective, although there is extensive tortuosity bilaterally, particularly in the external iliac arteries, as detailed above. 2. Severe thickening calcification of the aortic valve, compatible with the clinical history  of severe aortic stenosis. 3. Aortic atherosclerosis, in addition to left main and 3 vessel coronary artery disease. 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 4. Mild cardiomegaly. 5. Multiple small pulmonary nodules scattered throughout the lungs bilaterally, measuring 7 mm or less in mean diameter. Many of these are subpleural in location, and are therefore favored to represent benign subpleural lymph nodes, however, these are all considered nonspecific. Non-contrast chest CT at 3-6 months is recommended. If the nodules are stable at time of repeat CT, then future CT at 18-24 months (from today's scan) is considered optional for low-risk patients, but is recommended for high-risk patients. This recommendation follows the consensus statement: Guidelines for Management of Incidental Pulmonary Nodules Detected on CT Images: From the Fleischner Society 2017; Radiology 2017; 284:228-243. 6. Additional incidental findings, as above. Electronically Signed   By: Vinnie Langton M.D.   On: 03/20/2016 10:19   Ct Angio Abd/pel W/ And/or W/o  Result Date: 03/20/2016 CLINICAL DATA:  82 year old male with history of severe aortic stenosis. Preprocedural study prior to potential transcatheter aortic valve replacement (TAVR). Chest tightness and sensation of racing heart beat. Fatigue yesterday. EXAM: CT ANGIOGRAPHY CHEST, ABDOMEN AND PELVIS TECHNIQUE: Multidetector CT imaging through the chest, abdomen and pelvis was performed using the standard protocol during bolus administration of intravenous contrast. Multiplanar reconstructed images and MIPs were obtained and reviewed to evaluate the vascular anatomy. CONTRAST:  70 mL of Isovue 370. COMPARISON:  No priors. FINDINGS: CTA CHEST FINDINGS Cardiovascular: Heart size is mildly enlarged. There is no significant pericardial fluid, thickening  or pericardial calcification. There is aortic atherosclerosis, as well as atherosclerosis of the great vessels of the mediastinum and the coronary arteries, including calcified atherosclerotic plaque in the left main, left anterior descending, left circumflex and right coronary arteries. Severe  thickening calcification of the aortic valve. Mediastinum/Lymph Nodes: No pathologically enlarged mediastinal or hilar lymph nodes. Densely calcified right hilar lymph node incidentally noted. Esophagus is unremarkable in appearance. No axillary lymphadenopathy. Lungs/Pleura: There are several pulmonary nodules scattered throughout the lungs bilaterally, the largest of which measures up to 8 x 6 mm (mean diameter of 7 mm) in the subpleural aspect of the right lower lobe (image 41 of series 407). These are nonspecific, but most of these are subpleural in location, likely to represent subpleural lymph nodes. Areas of scarring and/or subsegmental atelectasis are noted in the lung bases bilaterally, particularly in the right lower lobe. No acute consolidative airspace disease. No pleural effusions. Small right-sided Bochdalek's hernia incidentally noted. Musculoskeletal/Soft Tissues: There are no aggressive appearing lytic or blastic lesions noted in the visualized portions of the skeleton. CTA ABDOMEN AND PELVIS FINDINGS Hepatobiliary: Several sub cm well-defined low-attenuation lesions are noted in the liver, largest of which measures up to 9 mm in segment 4A. These are too small to definitively characterize, but are favored to represent tiny cysts. No other definite suspicious appearing hepatic lesions are noted. There is no intra or extrahepatic biliary ductal dilatation. Gallbladder is normal in appearance. Pancreas: No pancreatic mass. No pancreatic ductal dilatation. No pancreatic or peripancreatic fluid or inflammatory changes. Spleen: Unremarkable. Adrenals/Urinary Tract: Sub cm low-attenuation lesions in the left kidney  are too small to definitively characterize, but are statistically likely tiny cysts. Right kidney and bilateral adrenal glands are normal in appearance. No hydroureteronephrosis. Urinary bladder is normal in appearance. Stomach/Bowel: The appearance of the stomach is normal. There is no pathologic dilatation of small bowel or colon. The appendix is not confidently identified and may be surgically absent. Regardless, there are no inflammatory changes noted adjacent to the cecum to suggest the presence of an acute appendicitis at this time. Vascular/Lymphatic: Vascular findings and measurements pertinent to potential TAVR procedure, as detailed below. No aneurysm or dissection identified in the abdominal or pelvic vasculature. The celiac axis, superior mesenteric artery and inferior mesenteric artery all appear widely patent, without hemodynamically significant stenosis. There are single renal arteries bilaterally which are both widely patent. There is complete inclusion of the right internal iliac artery proximally, which is subsequently reconstituted distally from collateral pathways. No lymphadenopathy noted in the abdomen or pelvis. Reproductive: Fiducial markers associated with the prostate gland. Prostate gland and seminal vesicles are otherwise unremarkable in appearance. Other: Low-attenuation material in the right inguinal region, presumably a Prolene plug from prior herniorrhaphy. No significant volume of ascites. No pneumoperitoneum. Musculoskeletal: Bilateral pars defects at L5 with 14 mm of anterolisthesis of L5 upon S1. There are no aggressive appearing lytic or blastic lesions noted in the visualized portions of the skeleton. VASCULAR MEASUREMENTS PERTINENT TO TAVR: AORTA: Minimal Aortic Diameter -  13 x 14 mm Severity of Aortic Calcification -  moderate RIGHT PELVIS: Right Common Iliac Artery - Minimal Diameter - 7.7 x 6.4 mm Tortuosity - mild Calcification - moderate Right External Iliac Artery -  Minimal Diameter - 7.2 x 7.1 mm Tortuosity - severe Calcification - none Right Common Femoral Artery - Minimal Diameter - 6.9 x 8.0 mm Tortuosity - mild Calcification - none LEFT PELVIS: Left Common Iliac Artery - Minimal Diameter - 6.1 x 7.3 mm Tortuosity - moderate Calcification - mild Left External Iliac Artery - Minimal Diameter - 6.8 x 6.9 mm Tortuosity - severe Calcification - none Left Common Femoral Artery - Minimal Diameter - 6.5 x 7.9 mm Tortuosity -  mild Calcification - mild Review of the MIP images confirms the above findings. IMPRESSION: 1. Vascular findings and measurements pertinent to potential TAVR procedure, as above. This patient appears to have suitable pelvic arterial access bilaterally from a size perspective, although there is extensive tortuosity bilaterally, particularly in the external iliac arteries, as detailed above. 2. Severe thickening calcification of the aortic valve, compatible with the clinical history of severe aortic stenosis. 3. Aortic atherosclerosis, in addition to left main and 3 vessel coronary artery disease. 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 4. Mild cardiomegaly. 5. Multiple small pulmonary nodules scattered throughout the lungs bilaterally, measuring 7 mm or less in mean diameter. Many of these are subpleural in location, and are therefore favored to represent benign subpleural lymph nodes, however, these are all considered nonspecific. Non-contrast chest CT at 3-6 months is recommended. If the nodules are stable at time of repeat CT, then future CT at 18-24 months (from today's scan) is considered optional for low-risk patients, but is recommended for high-risk patients. This recommendation follows the consensus statement: Guidelines for Management of Incidental Pulmonary Nodules Detected on CT Images: From the Fleischner Society  2017; Radiology 2017; 284:228-243. 6. Additional incidental findings, as above. Electronically Signed   By: Vinnie Langton M.D.   On: 03/20/2016 10:19    ASSESSMENT AND PLAN:    Principal Problem:   PAF (paroxysmal atrial fibrillation) (HCC) Active Problems:   Acute on chronic diastolic heart failure (HCC)   Atrial fibrillation with RVR (HCC)   Atrial fibrillation with rapid ventricular response (HCC)   Hypertensive heart disease with heart failure ([place])   Pure hypercholesterolemia   Tachy-brady syndrome (HCC)  Tachy-Brady Syndrome: patient admitted from office with highly symptomatic afib with RVR. He then spontaneously converted shortly after admission through ER. Tele shows sinus rhythm intermittently with atrial fibrillation with RVR. Rate in AF is 100-110s.  Post-conversion pauses up to 4 seconds, followed by bradycardia in the 30s and then sinus rhythm. -- Currently on amio 400mg  BID  -- Continue Eliquis 2.5 mg BID for CHADSVASC of at least 4 ( HTN, age, vasc dz).  -- Plan will be for PPM this afternoon. NPO until then   Moderate to severe aortic stenosis:  Mean gradient 25 mmHg by echo 10/2015.  Valve area 1.04 cm^2 by cath 01/2016.  He is leaning towards TAVR.  CAD:  Mr. Witts has 80% lesions in the mid RCA and OM1 that are technically challenging to open via PCI, though not impossible. He is leaning towards PCI and TAVR  Hypertensive heart disease: BP with moderate control.    Signed: Angelena Form, PA-C 03/21/2016 11:23 AM  Pager [phone]  EP Attending  Patient seen and examined. He has symptomatic tachy-brady syndrome and pauses of 4 seconds. He has atrial fib with a RVR. He has tight AS. He has LBBB. He needs his valve replaced but I think PPM insertion is reasonable. I have discussed the risks/benefits/goals/expectations of PPM insertion with the patient and his family and they wish to proceed.  Mikle Bosworth.D.

## 2016-03-21 NOTE — Progress Notes (Addendum)
Physical Therapy Treatment- Pre TAVR assessment/ Discharge Patient Details Name: Ryan [name] MRN: [id number] DOB: 1930-02-11 Today's Date: 03/21/2016    History of Present Illness 82 yo male with onset of  PAF and RVR, L BBB with consideration for CABG and MAZE or TAVR.  PMHx:  macular degeneration, aortic v stenosis, DJD, OSA, bladder CA, carotid endarterectomy, 3 vessel CAD    PT Comments    Pt very pleasant and moving well. Pt reports exercising daily at home with walking in the pool and other activities. Pt uses a RW at baseline due to painful knees from arthritis. Pt with no history of falls or difficulty with mobility and is active and mobile at home. No further therapy needs at this time. Pre TAVR assessment completed below and will sign off from P.T. With pt aware and agreeable. Recommend daily ambulation acutely.  Pre-TAVR assessment: Pre-  BP: 133/75,  HR 72, sats 92% on RA Post-  BP 126/81, HR 79, sats 95% on RA  5 meter walk: 9.52, 8.55, 9.31 = avg of 9.13 6 min walk test = 726 feet Pt rates self as 1 on clinical frailty scale Borg dyspnea of 0.5 with ambulation  Follow Up Recommendations  No PT follow up     Equipment Recommendations  None recommended by PT    Recommendations for Other Services       Precautions / Restrictions Precautions Precautions: Fall    Mobility  Bed Mobility               General bed mobility comments: in chair on arrival  Transfers Overall transfer level: Independent                  Ambulation/Gait Ambulation/Gait assistance: Modified independent (Device/Increase time) Ambulation Distance (Feet): 1000 Feet Assistive device: Rolling walker (2 wheeled) Gait Pattern/deviations: WFL(Within Functional Limits)   Gait velocity interpretation: at or above normal speed for age/gender General Gait Details: pt with good speed, cues x 2 to step into RW. No LOB or noted deficits. Pt prefers to use RW due to arthritic  knees   Stairs            Wheelchair Mobility    Modified Rankin (Stroke Patients Only)       Balance                                    Cognition Arousal/Alertness: Awake/alert Behavior During Therapy: WFL for tasks assessed/performed Overall Cognitive Status: Within Functional Limits for tasks assessed                      Exercises      General Comments        Pertinent Vitals/Pain Pain Assessment: No/denies pain    Home Living                      Prior Function            PT Goals (current goals can now be found in the care plan section) Progress towards PT goals: Goals met/education completed, patient discharged from PT    Frequency           PT Plan Discharge plan needs to be updated    Co-evaluation             End of Session   Activity Tolerance: Patient tolerated treatment well Patient  left: Other (comment);with call bell/phone within reach;with family/visitor present (in bathroom)     Time: 1211-1226 PT Time Calculation (min) (ACUTE ONLY): 15 min  Charges:  $Gait Training: 8-22 mins                    G Codes:      Melford Aase 04-16-16, 12:37 PM  Elwyn Reach, [place]

## 2016-03-21 NOTE — Consult Note (Signed)
CARDIOLOGY CONSULT NOTE  Patient ID: Ryan [name], MRN: [id number], DOB/AGE: 02-21-1930 82 y.o. Admit date: 03/18/2016 Date of Consult: 03/21/2016  Primary Physician: Velna Hatchet, MD Primary Cardiologist: Dr Meda Coffee Referring Physician: Dr Roxy Manns  Chief Complaint: Shortness of breath/chest pain  Reason for Consultation: Aortic stenosis/CAD  HPI: 82 yo male with history of aortic stenosis, CAD, carotid stenosis s/p bilateral carotid endarterectomy, who is currently hospitalized with problems related to atrial fibrillation with tachy - brady syndrome. He has undergone PPM placement earlier this afternoon.   He has been previously active, but over the last 3-4 months has become increasingly weak and dyspneic with physical activity. He has been found to have moderate-severe aortic stenosis with a mean transaortic gradient of 25 mmHg in May 2017 and dimensionless index of 0.22. He ultimately underwent cardiac catheterization confirming severe aortic stenosis with a calculated valve area of 1.0 square cm. He was also found to have severe calcific disease of the RCA and OM branches with 80% stenoses of both vessels.   He has had accelerating symptoms recently and was found to be in AF with RVR requiring hospitalization. On telemetry monitoring, he has been in and out of atrial fibrillation with post-termination pauses and marked bradycardia when in sinus rhythm. A pacemaker was placed by Dr Lovena Le earlier today. The patient has no complaints at present while he is resting in bed.   The patient is independent and lives locally with his wife. His son is a gastroenterologist with  GI.   Medical History:  Past Medical History:  Diagnosis Date  . Age-related macular degeneration   . Allergic rhinitis   . Aortic valve stenosis   . Carotid artery obstruction   . Chronic lower back pain   . Coronary arteriosclerosis in native artery   . Diverticulitis of colon   . DJD (degenerative  joint disease)   . Dysthymia   . History of elevated PSA 05/2009  . HLD (hyperlipidemia)   . Hypertension   . Nocturnal hypoxemia   . OSA (obstructive sleep apnea)    intolerant to CPAP  . Paroxysmal supraventricular tachycardia ([place])   . Primary malignant neoplasm of bladder ([place])   . Prostate cancer [facility])       Surgical History:  Past Surgical History:  Procedure Laterality Date  . ABDOMINAL SURGERY  1981   Meckles Diverticulum with volvulus  . APPENDECTOMY    . CARDIAC CATHETERIZATION N/A 01/17/2016   Procedure: Right/Left Heart Cath and Coronary Angiography;  Surgeon: Belva Crome, MD;  Location: [facility];  Service: Cardiovascular;  Laterality: N/A;  . CAROTID ENDARTERECTOMY     bilateral  . HERNIA REPAIR Right 2009     Home Meds: Prior to Admission medications   Medication Sig Start Date End Date Taking? Authorizing Provider  amLODipine (NORVASC) 10 MG tablet Take 10 mg by mouth daily.   Yes Historical Provider, MD  aspirin 81 MG tablet Take 81 mg by mouth daily.   Yes Historical Provider, MD  cholecalciferol (VITAMIN D) 1000 units tablet Take 1,000 Units by mouth daily.   Yes Historical Provider, MD  Coenzyme Q10 (COQ-10 PO) Take 1 capsule by mouth daily.   Yes Historical Provider, MD  latanoprost (XALATAN) 0.005 % ophthalmic solution Place 1 drop into both eyes at bedtime.   Yes Historical Provider, MD  lisinopril (PRINIVIL,ZESTRIL) 20 MG tablet Take 20 mg by mouth daily.   Yes Historical Provider, MD  omeprazole (PRILOSEC) 20 MG capsule Take  20 mg by mouth daily.   Yes Historical Provider, MD    Inpatient Medications:  . amiodarone  400 mg Oral BID  . apixaban  2.5 mg Oral BID  . aspirin EC  81 mg Oral Daily  . cholecalciferol  1,000 Units Oral Daily  . diltiazem  10 mg Intravenous Once  . latanoprost  1 drop Both Eyes QHS  . lisinopril  20 mg Oral Daily  . pantoprazole  40 mg Oral Daily  . sodium chloride flush  3 mL Intravenous Q12H   . diltiazem  (CARDIZEM) infusion      Allergies:  Allergies  Allergen Reactions  . Hydrochlorothiazide Anaphylaxis  . Ace Inhibitors Cough  . Ciprofloxacin   . Losartan Cough  . Sulfa Antibiotics     Social History   Social History  . Marital status: Married    Spouse name: N/A  . Number of children: N/A  . Years of education: N/A   Occupational History  . retired    Social History Main Topics  . Smoking status: Never Smoker  . Smokeless tobacco: Never Used  . Alcohol use No  . Drug use: No  . Sexual activity: Not on file   Other Topics Concern  . Not on file   Social History Narrative  . No narrative on file     Family History  Problem Relation Age of Onset  . Heart disease Mother   . Heart disease Father      Review of Systems: General: negative for chills, fever, night sweats or weight changes.  ENT: negative for rhinorrhea or epistaxis Cardiovascular: positive for chest pain and jaw pain with exertion and sometimes at rest Dermatological: negative for rash Respiratory: negative for cough or wheezing, + shortness of breath GI: negative for nausea, vomiting, diarrhea, bright red blood per rectum, melena, or hematemesis GU: no hematuria, urgency, or frequency Neurologic: negative for visual changes, syncope, headache, or dizziness Heme: no easy bruising or bleeding Endo: negative for excessive thirst, thyroid disorder, or flushing Musculoskeletal: + arthritis - knees  All other systems reviewed and are otherwise negative except as noted above.  Physical Exam: Blood pressure 126/81, pulse 80, temperature 97.8 F (36.6 C), temperature source Oral, resp. rate 15, height 5\' 7"  (1.702 m), weight 58.9 kg (129 lb 12.8 oz), SpO2 95 %. Pt is alert and oriented, elderly male, in no distress. HEENT: normal Neck: JVP normal. Carotid upstrokes normal without bruits. No thyromegaly. Lungs: equal expansion, clear bilaterally CV: Apex is discrete and nondisplaced, RRR with 3/6  harsh systolic murmur at the RUSB Abd: soft, NT, +BS, no bruit, no hepatosplenomegaly Back: no CVA tenderness Ext: no C/C/E        DP/PT pulses intact and = Skin: warm and dry without rash Neuro: CNII-XII intact             Strength intact = bilaterally    Labs:  Recent Labs  03/18/16 1958  TROPONINI 0.03*   Lab Results  Component Value Date   WBC 4.4 03/21/2016   HGB 15.9 03/21/2016   HCT 47.4 03/21/2016   MCV 90.8 03/21/2016   PLT 223 03/21/2016    Recent Labs Lab 03/18/16 1222  03/21/16 0544  NA 140  < > 139  K 3.9  < > 3.9  CL 109  < > 110  CO2 21*  < > 22  BUN 20  < > 13  CREATININE 1.12  < > 1.07  CALCIUM 9.5  < >  9.2  PROT 6.4*  --   --   BILITOT 0.6  --   --   ALKPHOS 64  --   --   ALT 19  --   --   AST 26  --   --   GLUCOSE 93  < > 83  < > = values in this interval not displayed. Lab Results  Component Value Date   CHOL 147 03/20/2016   HDL 61 03/20/2016   LDLCALC 75 03/20/2016   TRIG 54 03/20/2016   No results found for: DDIMER  Radiology/Studies:  Ct Coronary Morph W/cta Cor W/score W/ca W/cm &/or Wo/cm  Addendum Date: 03/19/2016   ADDENDUM REPORT: 03/19/2016 17:46 CLINICAL DATA:  82 year old male with severe aortic stenosis. EXAM: Cardiac TAVR CT TECHNIQUE: The patient was scanned on a Philips 256 scanner. A 120 kV retrospective scan was triggered in the descending thoracic aorta at 111 HU's. Gantry rotation speed was 270 msecs and collimation was .9 mm. 10 mg of iv Metoprolol and no nitro were given. The 3D data set was reconstructed in 5% intervals of the R-R cycle. Systolic and diastolic phases were analyzed on a dedicated work station using MPR, MIP and VRT modes. The patient received 80 cc of contrast. FINDINGS: Aortic Valve: Trileaflet, severely thickened and calcified with severely restricted leaflet opening. There is minimal calcification extending into the LVOT. Aorta:  Normal size, moderate diffuse calcifications, no dissection.  Sinotubular Junction:  31 x 31 mm Ascending Thoracic Aorta:  35 x 34 mm Aortic Arch:  25 x 25 mm Descending Thoracic Aorta:  23 x 23 mm Sinus of Valsalva Measurements: Non-coronary:  35 mm Right -coronary:  36 mm Left -coronary:  36 mm Coronary Artery Height above Annulus: Left Main:  11 mm Right Coronary:  12 mm Virtual Basal Annulus Measurements: Maximum/Minimum Diameter:  27 x 26 mm Perimeter:  82 mm Area:  504 mm2 Optimum Fluoroscopic Angle for Delivery:  LAO 8 CAU 7 Other findings: A large left atrial appendage without evidence of a thrombus. Normal pulmonary vein drainage into the left atrium. Dilated pulmonary artery measuring 34 x 25 mm. IMPRESSION: 1. Trileaflet, severely thickened and calcified aortic valve with severely restricted leaflet opening and annular measurements suitable for delivery of a 26 mm Edwards-SAPIEN 3 valve. 2. Sufficient annulus to coronary distance. 3. Optimum Fluoroscopic Angle for Delivery:  LAO 8 CAU 7. 4. A large left atrial appendage without evidence of a thrombus 5. Dilated pulmonary artery measuring 34 x 25 mm Ena Dawley Electronically Signed   By: Ena Dawley   On: 03/19/2016 17:46   Result Date: 03/19/2016 EXAM: OVER-READ INTERPRETATION  CT CHEST The following report is an over-read performed by radiologist Dr. Rebekah Chesterfield [facility] on 03/19/2016. This over-read does not include interpretation of cardiac or coronary anatomy or pathology. The coronary calcium score/coronary CTA interpretation by the cardiologist is attached. COMPARISON:  None. FINDINGS: Full description of extracardiac findings will be dictated separately a under requisition for contemporaneously obtained CTA of the chest, abdomen and pelvis 03/19/2016. IMPRESSION: Please see separate dictation for contemporaneously obtained CTA of the chest, abdomen and pelvis 03/19/2016 for full description of extracardiac findings. Electronically Signed: By: Vinnie Langton M.D. On:  03/19/2016 16:53   Dg Chest Port 1 View  Result Date: 03/18/2016 CLINICAL DATA:  Episode of palpitations, chest pressure and fatigue at doctor's office today, worsening symptoms, history hypertension, prostate cancer, coronary artery disease, carotid artery disease, bladder cancer EXAM: PORTABLE CHEST 1  VIEW COMPARISON:  Portable exam 1150 hours without priors for comparison FINDINGS: Upper normal heart size. Calcified and elongated thoracic aorta. Mediastinal contours and pulmonary vascularity normal. Lungs clear. No pleural effusion or pneumothorax. Bones demineralized. IMPRESSION: No acute abnormalities. Aortic atherosclerosis. Electronically Signed   By: Lavonia Dana M.D.   On: 03/18/2016 12:09   Ct Angio Chest Aorta W/cm &/or Wo/cm  Result Date: 03/20/2016 CLINICAL DATA:  82 year old male with history of severe aortic stenosis. Preprocedural study prior to potential transcatheter aortic valve replacement (TAVR). Chest tightness and sensation of racing heart beat. Fatigue yesterday. EXAM: CT ANGIOGRAPHY CHEST, ABDOMEN AND PELVIS TECHNIQUE: Multidetector CT imaging through the chest, abdomen and pelvis was performed using the standard protocol during bolus administration of intravenous contrast. Multiplanar reconstructed images and MIPs were obtained and reviewed to evaluate the vascular anatomy. CONTRAST:  70 mL of Isovue 370. COMPARISON:  No priors. FINDINGS: CTA CHEST FINDINGS Cardiovascular: Heart size is mildly enlarged. There is no significant pericardial fluid, thickening or pericardial calcification. There is aortic atherosclerosis, as well as atherosclerosis of the great vessels of the mediastinum and the coronary arteries, including calcified atherosclerotic plaque in the left main, left anterior descending, left circumflex and right coronary arteries. Severe thickening calcification of the aortic valve. Mediastinum/Lymph Nodes: No pathologically enlarged mediastinal or hilar lymph nodes.  Densely calcified right hilar lymph node incidentally noted. Esophagus is unremarkable in appearance. No axillary lymphadenopathy. Lungs/Pleura: There are several pulmonary nodules scattered throughout the lungs bilaterally, the largest of which measures up to 8 x 6 mm (mean diameter of 7 mm) in the subpleural aspect of the right lower lobe (image 41 of series 407). These are nonspecific, but most of these are subpleural in location, likely to represent subpleural lymph nodes. Areas of scarring and/or subsegmental atelectasis are noted in the lung bases bilaterally, particularly in the right lower lobe. No acute consolidative airspace disease. No pleural effusions. Small right-sided Bochdalek's hernia incidentally noted. Musculoskeletal/Soft Tissues: There are no aggressive appearing lytic or blastic lesions noted in the visualized portions of the skeleton. CTA ABDOMEN AND PELVIS FINDINGS Hepatobiliary: Several sub cm well-defined low-attenuation lesions are noted in the liver, largest of which measures up to 9 mm in segment 4A. These are too small to definitively characterize, but are favored to represent tiny cysts. No other definite suspicious appearing hepatic lesions are noted. There is no intra or extrahepatic biliary ductal dilatation. Gallbladder is normal in appearance. Pancreas: No pancreatic mass. No pancreatic ductal dilatation. No pancreatic or peripancreatic fluid or inflammatory changes. Spleen: Unremarkable. Adrenals/Urinary Tract: Sub cm low-attenuation lesions in the left kidney are too small to definitively characterize, but are statistically likely tiny cysts. Right kidney and bilateral adrenal glands are normal in appearance. No hydroureteronephrosis. Urinary bladder is normal in appearance. Stomach/Bowel: The appearance of the stomach is normal. There is no pathologic dilatation of small bowel or colon. The appendix is not confidently identified and may be surgically absent. Regardless, there  are no inflammatory changes noted adjacent to the cecum to suggest the presence of an acute appendicitis at this time. Vascular/Lymphatic: Vascular findings and measurements pertinent to potential TAVR procedure, as detailed below. No aneurysm or dissection identified in the abdominal or pelvic vasculature. The celiac axis, superior mesenteric artery and inferior mesenteric artery all appear widely patent, without hemodynamically significant stenosis. There are single renal arteries bilaterally which are both widely patent. There is complete inclusion of the right internal iliac artery proximally, which is subsequently reconstituted distally from collateral  pathways. No lymphadenopathy noted in the abdomen or pelvis. Reproductive: Fiducial markers associated with the prostate gland. Prostate gland and seminal vesicles are otherwise unremarkable in appearance. Other: Low-attenuation material in the right inguinal region, presumably a Prolene plug from prior herniorrhaphy. No significant volume of ascites. No pneumoperitoneum. Musculoskeletal: Bilateral pars defects at L5 with 14 mm of anterolisthesis of L5 upon S1. There are no aggressive appearing lytic or blastic lesions noted in the visualized portions of the skeleton. VASCULAR MEASUREMENTS PERTINENT TO TAVR: AORTA: Minimal Aortic Diameter -  13 x 14 mm Severity of Aortic Calcification -  moderate RIGHT PELVIS: Right Common Iliac Artery - Minimal Diameter - 7.7 x 6.4 mm Tortuosity - mild Calcification - moderate Right External Iliac Artery - Minimal Diameter - 7.2 x 7.1 mm Tortuosity - severe Calcification - none Right Common Femoral Artery - Minimal Diameter - 6.9 x 8.0 mm Tortuosity - mild Calcification - none LEFT PELVIS: Left Common Iliac Artery - Minimal Diameter - 6.1 x 7.3 mm Tortuosity - moderate Calcification - mild Left External Iliac Artery - Minimal Diameter - 6.8 x 6.9 mm Tortuosity - severe Calcification - none Left Common Femoral Artery - Minimal  Diameter - 6.5 x 7.9 mm Tortuosity - mild Calcification - mild Review of the MIP images confirms the above findings. IMPRESSION: 1. Vascular findings and measurements pertinent to potential TAVR procedure, as above. This patient appears to have suitable pelvic arterial access bilaterally from a size perspective, although there is extensive tortuosity bilaterally, particularly in the external iliac arteries, as detailed above. 2. Severe thickening calcification of the aortic valve, compatible with the clinical history of severe aortic stenosis. 3. Aortic atherosclerosis, in addition to left main and 3 vessel coronary artery disease. 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 4. Mild cardiomegaly. 5. Multiple small pulmonary nodules scattered throughout the lungs bilaterally, measuring 7 mm or less in mean diameter. Many of these are subpleural in location, and are therefore favored to represent benign subpleural lymph nodes, however, these are all considered nonspecific. Non-contrast chest CT at 3-6 months is recommended. If the nodules are stable at time of repeat CT, then future CT at 18-24 months (from today's scan) is considered optional for low-risk patients, but is recommended for high-risk patients. This recommendation follows the consensus statement: Guidelines for Management of Incidental Pulmonary Nodules Detected on CT Images: From the Fleischner Society 2017; Radiology 2017; 284:228-243. 6. Additional incidental findings, as above. Electronically Signed   By: Vinnie Langton M.D.   On: 03/20/2016 10:19   Ct Angio Abd/pel W/ And/or W/o  Result Date: 03/20/2016 CLINICAL DATA:  82 year old male with history of severe aortic stenosis. Preprocedural study prior to potential transcatheter aortic valve replacement (TAVR). Chest tightness and sensation of racing heart beat.  Fatigue yesterday. EXAM: CT ANGIOGRAPHY CHEST, ABDOMEN AND PELVIS TECHNIQUE: Multidetector CT imaging through the chest, abdomen and pelvis was performed using the standard protocol during bolus administration of intravenous contrast. Multiplanar reconstructed images and MIPs were obtained and reviewed to evaluate the vascular anatomy. CONTRAST:  70 mL of Isovue 370. COMPARISON:  No priors. FINDINGS: CTA CHEST FINDINGS Cardiovascular: Heart size is mildly enlarged. There is no significant pericardial fluid, thickening or pericardial calcification. There is aortic atherosclerosis, as well as atherosclerosis of the great vessels of the mediastinum and the coronary arteries, including calcified atherosclerotic plaque in the left main, left anterior  descending, left circumflex and right coronary arteries. Severe thickening calcification of the aortic valve. Mediastinum/Lymph Nodes: No pathologically enlarged mediastinal or hilar lymph nodes. Densely calcified right hilar lymph node incidentally noted. Esophagus is unremarkable in appearance. No axillary lymphadenopathy. Lungs/Pleura: There are several pulmonary nodules scattered throughout the lungs bilaterally, the largest of which measures up to 8 x 6 mm (mean diameter of 7 mm) in the subpleural aspect of the right lower lobe (image 41 of series 407). These are nonspecific, but most of these are subpleural in location, likely to represent subpleural lymph nodes. Areas of scarring and/or subsegmental atelectasis are noted in the lung bases bilaterally, particularly in the right lower lobe. No acute consolidative airspace disease. No pleural effusions. Small right-sided Bochdalek's hernia incidentally noted. Musculoskeletal/Soft Tissues: There are no aggressive appearing lytic or blastic lesions noted in the visualized portions of the skeleton. CTA ABDOMEN AND PELVIS FINDINGS Hepatobiliary: Several sub cm well-defined low-attenuation lesions are noted in the liver,  largest of which measures up to 9 mm in segment 4A. These are too small to definitively characterize, but are favored to represent tiny cysts. No other definite suspicious appearing hepatic lesions are noted. There is no intra or extrahepatic biliary ductal dilatation. Gallbladder is normal in appearance. Pancreas: No pancreatic mass. No pancreatic ductal dilatation. No pancreatic or peripancreatic fluid or inflammatory changes. Spleen: Unremarkable. Adrenals/Urinary Tract: Sub cm low-attenuation lesions in the left kidney are too small to definitively characterize, but are statistically likely tiny cysts. Right kidney and bilateral adrenal glands are normal in appearance. No hydroureteronephrosis. Urinary bladder is normal in appearance. Stomach/Bowel: The appearance of the stomach is normal. There is no pathologic dilatation of small bowel or colon. The appendix is not confidently identified and may be surgically absent. Regardless, there are no inflammatory changes noted adjacent to the cecum to suggest the presence of an acute appendicitis at this time. Vascular/Lymphatic: Vascular findings and measurements pertinent to potential TAVR procedure, as detailed below. No aneurysm or dissection identified in the abdominal or pelvic vasculature. The celiac axis, superior mesenteric artery and inferior mesenteric artery all appear widely patent, without hemodynamically significant stenosis. There are single renal arteries bilaterally which are both widely patent. There is complete inclusion of the right internal iliac artery proximally, which is subsequently reconstituted distally from collateral pathways. No lymphadenopathy noted in the abdomen or pelvis. Reproductive: Fiducial markers associated with the prostate gland. Prostate gland and seminal vesicles are otherwise unremarkable in appearance. Other: Low-attenuation material in the right inguinal region, presumably a Prolene plug from prior herniorrhaphy. No  significant volume of ascites. No pneumoperitoneum. Musculoskeletal: Bilateral pars defects at L5 with 14 mm of anterolisthesis of L5 upon S1. There are no aggressive appearing lytic or blastic lesions noted in the visualized portions of the skeleton. VASCULAR MEASUREMENTS PERTINENT TO TAVR: AORTA: Minimal Aortic Diameter -  13 x 14 mm Severity of Aortic Calcification -  moderate RIGHT PELVIS: Right Common Iliac Artery - Minimal Diameter - 7.7 x 6.4 mm Tortuosity - mild Calcification - moderate Right External Iliac Artery - Minimal Diameter - 7.2 x 7.1 mm Tortuosity - severe Calcification - none Right Common Femoral Artery - Minimal Diameter - 6.9 x 8.0 mm Tortuosity - mild Calcification - none LEFT PELVIS: Left Common Iliac Artery - Minimal Diameter - 6.1 x 7.3 mm Tortuosity - moderate Calcification - mild Left External Iliac Artery - Minimal Diameter - 6.8 x 6.9 mm Tortuosity - severe Calcification - none Left Common Femoral Artery - Minimal  Diameter - 6.5 x 7.9 mm Tortuosity - mild Calcification - mild Review of the MIP images confirms the above findings. IMPRESSION: 1. Vascular findings and measurements pertinent to potential TAVR procedure, as above. This patient appears to have suitable pelvic arterial access bilaterally from a size perspective, although there is extensive tortuosity bilaterally, particularly in the external iliac arteries, as detailed above. 2. Severe thickening calcification of the aortic valve, compatible with the clinical history of severe aortic stenosis. 3. Aortic atherosclerosis, in addition to left main and 3 vessel coronary artery disease. 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 4. Mild cardiomegaly. 5. Multiple small pulmonary nodules scattered throughout the lungs bilaterally, measuring 7 mm or less in mean diameter. Many of these are subpleural  in location, and are therefore favored to represent benign subpleural lymph nodes, however, these are all considered nonspecific. Non-contrast chest CT at 3-6 months is recommended. If the nodules are stable at time of repeat CT, then future CT at 18-24 months (from today's scan) is considered optional for low-risk patients, but is recommended for high-risk patients. This recommendation follows the consensus statement: Guidelines for Management of Incidental Pulmonary Nodules Detected on CT Images: From the Fleischner Society 2017; Radiology 2017; 284:228-243. 6. Additional incidental findings, as above. Electronically Signed   By: Vinnie Langton M.D.   On: 03/20/2016 10:19   Cardiac Studies: Cardiac Catheterization 2016-01-26: Conclusion     Prox RCA to Mid RCA lesion, 20 %stenosed.  Mid RCA lesion, 80 %stenosed.  Ost 1st Mrg to 1st Mrg lesion, 80 %stenosed.  Mid Cx to Dist Cx lesion, 70 %stenosed.  Prox LAD to Dist LAD lesion, 40 %stenosed.  The left ventricular systolic function is normal.  LV end diastolic pressure is normal.  The left ventricular ejection fraction is 55-65% by visual estimate.  LV end diastolic pressure is normal.    There is three-vessel coronary calcification.  80% eccentric mid RCA stenosis.  80% ostial obtuse marginal #1 stenosis.  Moderate diffuse calcified LAD disease less than 50%.  Normal left ventricular systolic function and hemodynamics  Normal pulmonary pressures and pulmonary capillary wedge.  Moderate to severe aortic stenosis with aortic valve area 1.04.   RECOMMENDATIONS:   Anti-ischemic therapy to potentially include beta blocker therapy, antiplatelet therapy, and statin therapy.  Intervention on both the right coronary and obtuse marginal stenosis would be higher than usual risk due to heavy calcification and vessel tortuosity. Initial inclination is medical therapy in the absence of clear anginal symptoms.  Fatigue even at  rest is of uncertain etiology. Exclude other possibilities including depression, although on one systemic illnesses.    Echo 10-10-2015: Study Conclusions  - Left ventricle: The cavity size was normal. Wall thickness was   increased in a pattern of mild LVH. Systolic function was normal.   The estimated ejection fraction was in the range of 60% to 65%.   Wall motion was normal; there were no regional wall motion   abnormalities. Doppler parameters are consistent with abnormal   left ventricular relaxation (grade 1 diastolic dysfunction).   Doppler parameters are consistent with indeterminate ventricular   filling pressure. - Aortic valve: Valve mobility was restricted. There was moderate   stenosis. Peak velocity (S): 387.9 cm/s. Mean gradient (S): 25 mm   Hg. Valve area (VTI): 1.04 cm^2. Valve area (Vmax): 1 cm^2. Valve   area (Vmean): 1.22 cm^2. -  Mitral valve: There was trivial regurgitation. - Left atrium: The atrium was mildly dilated. - Right ventricle: The cavity size was normal. Wall thickness was   normal. Systolic function was normal. - Atrial septum: No defect or patent foramen ovale was identified   by color flow Doppler. - Tricuspid valve: There was mild regurgitation. - Inferior vena cava: The vessel was normal in size. The   respirophasic diameter changes were in the normal range (= 50%),   consistent with normal central venous pressure.  STS Risk Calculator  Procedure                                          AVR + CABG  Risk of Mortality                                5.7% Morbidity or Mortality                       29.1% Prolonged LOS                                   16.4% Short LOS                                           16.3% Permanent Stroke                             3.6% Prolonged Vent Support                     17.2% DSW Infection                                     0.3% Renal Failure                                       8.5% Reoperation                                         12.6%  ASSESSMENT AND PLAN:  82 yo male with Stage D severe symptomatic aortic stenosis and comorbid cardiac conditions of 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 I suspect he has been having episodes of atrial fibrillation causing demand ischemia in the setting of CAD and aortic stenosis. I have personally reviewed with patient's cath and echo images and agree that TAVR might be a reasonable treatment option for him. I would recommend proceeding with PCI of the RCA and possibly the OM branches prior to TAVR since he has severe 2 vessel CAD with a significant ischemic burden. Complicating matters is the recent diagnosis of paroxysmal atrial fibrillation, now on anticoagulation with apixaban.   I have reviewed the natural history of aortic  stenosis with the patient and their family members who are present today. We have discussed the limitations of medical therapy and the poor prognosis associated with symptomatic aortic stenosis. We have reviewed potential treatment options, including palliative medical therapy, conventional surgical aortic valve replacement, and transcatheter aortic valve replacement. We discussed treatment options in the context of this patient's specific comorbid medical conditions. The patient and his family clearly prefer a minimally-invasive approach over conventional heart surgery in the setting of his advanced age. They understand there is an increased risk with PCI since rotational atherectomy will be required in the presence of calcific coronary disease.   I would anticipate hospital discharge tomorrow, and I will arrange follow-up in 2-3 weeks to arrange PCI as long as he is clinically stable. With PCI, he may require 'triple therapy' with ASA 81 mg, plavix 75 mg, and apixaban for a period of 1 months,  then I would anticipate using a combination of plavix and abixaban together for a total of 6 months.   All questions are answered today.   SignedSherren Mocha MD, [facility] 03/21/2016, 1:59 PM

## 2016-03-22 ENCOUNTER — Inpatient Hospital Stay (HOSPITAL_COMMUNITY): Payer: Medicare Other

## 2016-03-22 DIAGNOSIS — E785 Hyperlipidemia, unspecified: Secondary | ICD-10-CM

## 2016-03-22 DIAGNOSIS — I2510 Atherosclerotic heart disease of native coronary artery without angina pectoris: Secondary | ICD-10-CM

## 2016-03-22 LAB — CBC
HCT: 46.7 % (ref 39.0–52.0)
Hemoglobin: 15.8 g/dL (ref 13.0–17.0)
MCH: 30.6 pg (ref 26.0–34.0)
MCHC: 33.8 g/dL (ref 30.0–36.0)
MCV: 90.3 fL (ref 78.0–100.0)
Platelets: 197 10*3/uL (ref 150–400)
RBC: 5.17 MIL/uL (ref 4.22–5.81)
RDW: 13 % (ref 11.5–15.5)
WBC: 5.1 10*3/uL (ref 4.0–10.5)

## 2016-03-22 LAB — BASIC METABOLIC PANEL
ANION GAP: 6 (ref 5–15)
BUN: 17 mg/dL (ref 6–20)
CALCIUM: 9 mg/dL (ref 8.9–10.3)
CO2: 23 mmol/L (ref 22–32)
Chloride: 108 mmol/L (ref 101–111)
Creatinine, Ser: 1.09 mg/dL (ref 0.61–1.24)
GLUCOSE: 95 mg/dL (ref 65–99)
POTASSIUM: 4 mmol/L (ref 3.5–5.1)
SODIUM: 137 mmol/L (ref 135–145)

## 2016-03-22 MED ORDER — ATORVASTATIN CALCIUM 40 MG PO TABS: 40.0000 mg | ORAL_TABLET | Freq: Every day | ORAL | 5 refills | Status: DC

## 2016-03-22 MED ORDER — APIXABAN 2.5 MG PO TABS: 2.5000 mg | ORAL_TABLET | Freq: Two times a day (BID) | ORAL | 10 refills | Status: DC

## 2016-03-22 MED ORDER — ASPIRIN 81 MG PO TBEC: 81.0000 mg | DELAYED_RELEASE_TABLET | Freq: Every day | ORAL | Status: DC

## 2016-03-22 MED ORDER — APIXABAN 2.5 MG PO TABS: 2.5000 mg | ORAL_TABLET | Freq: Two times a day (BID) | ORAL | 0 refills | Status: DC

## 2016-03-22 MED ORDER — AMIODARONE HCL 400 MG PO TABS: 400.0000 mg | ORAL_TABLET | Freq: Two times a day (BID) | ORAL | 3 refills | Status: DC

## 2016-03-22 NOTE — Progress Notes (Signed)
Spoke with Dr. Lamona Curl on call for cardiology regarding the fact that patient does not have an order for a chest xray this a.m. Verbal order received to put in for a chest xray since patient had a pacemaker placed yesterday. Order placed in EPIC.

## 2016-03-22 NOTE — Progress Notes (Signed)
Patient Name: Ryan [name] Date of Encounter: 03/22/2016  Primary Cardiologist: Dr. Meda Coffee Electrophysiologist: Dr. Karel Jarvis Clinic: Dr. Tyrell Antonio Problem List     Principal Problem:   PAF (paroxysmal atrial fibrillation) [facility]) Active Problems:   Acute on chronic diastolic heart failure (HCC)   Atrial fibrillation with RVR (HCC)   Atrial fibrillation with rapid ventricular response (HCC)   Hypertensive heart disease with heart failure ([place])   Pure hypercholesterolemia   Tachy-brady syndrome [facility])    Patient Profile     82 yo male with Stage D severe symptomatic aortic stenosis and comorbid cardiac conditions of calcific high-grade CAD involving the RCA and left circumflex as well as paroxysmal atrial fibrillation with tachy brady syndrome requiring permanent pacemaker placement 03/21/16. Plan is for staged PCI of RCA and LCx, and likely TAVR in the near future.    Subjective   Doing well this morning. He denies resting CP and dyspnea. Ambulating w/o much difficulty.   Inpatient Medications    Scheduled Meds: . amiodarone  400 mg Oral BID  . apixaban  2.5 mg Oral BID  . aspirin EC  81 mg Oral Daily  . cholecalciferol  1,000 Units Oral Daily  . diltiazem  10 mg Intravenous Once  . latanoprost  1 drop Both Eyes QHS  . lisinopril  20 mg Oral Daily  . pantoprazole  40 mg Oral Daily  . sodium chloride flush  3 mL Intravenous Q12H   Continuous Infusions: . diltiazem (CARDIZEM) infusion     PRN Meds: sodium chloride, acetaminophen, metoprolol, ondansetron (ZOFRAN) IV, sodium chloride flush   Vital Signs    Vitals:   03/21/16 2011 03/22/16 0018 03/22/16 0408 03/22/16 0740  BP: (!) 112/58 118/66 137/78 124/74  Pulse: 61 (!) 59 60 60  Resp: 18 19 14 15   Temp: 97.8 F (36.6 C) 97.8 F (36.6 C) 97.6 F (36.4 C) 97.9 F (36.6 C)  TempSrc: Oral Oral Oral Oral  SpO2: 95% 98% 96% 97%  Weight:   127 lb 12.8 oz (58 kg)   Height:         Intake/Output Summary (Last 24 hours) at 03/22/16 0825 Last data filed at 03/22/16 0500  Gross per 24 hour  Intake             1040 ml  Output              150 ml  Net              890 ml   Filed Weights   03/20/16 0400 03/21/16 0459 03/22/16 0408  Weight: 130 lb 1.6 oz (59 kg) 129 lb 12.8 oz (58.9 kg) 127 lb 12.8 oz (58 kg)    Physical Exam   GEN: Well nourished, well developed, in no acute distress, left arm in sling.  HEENT: Grossly normal.  Neck: Supple, no JVD,  or masses. Bilateral carotid bruits, Cardiac: RRR, 2/6 AS murmur,  No rubs, or gallops. No clubbing, cyanosis, edema.  Radials/DP/PT 2+ and equal bilaterally.  Respiratory:  Respirations regular and unlabored, clear to auscultation bilaterally. GI: Soft, nontender, nondistended, BS + x 4. MS: no deformity or atrophy. Skin: warm and dry, no rash. Neuro:  Strength and sensation are intact. Psych: AAOx3.  Normal affect.  Labs    CBC  Recent Labs  03/21/16 0544 03/22/16 0522  WBC 4.4 5.1  HGB 15.9 15.8  HCT 47.4 46.7  MCV 90.8 90.3  PLT 223 197  Basic Metabolic Panel  Recent Labs  03/21/16 0544 03/22/16 0522  NA 139 137  K 3.9 4.0  CL 110 108  CO2 22 23  GLUCOSE 83 95  BUN 13 17  CREATININE 1.07 1.09  CALCIUM 9.2 9.0   Liver Function Tests No results for input(s): AST, ALT, ALKPHOS, BILITOT, PROT, ALBUMIN in the last 72 hours. No results for input(s): LIPASE, AMYLASE in the last 72 hours. Cardiac Enzymes No results for input(s): CKTOTAL, CKMB, CKMBINDEX, TROPONINI in the last 72 hours. BNP Invalid input(s): POCBNP D-Dimer No results for input(s): DDIMER in the last 72 hours. Hemoglobin A1C No results for input(s): HGBA1C in the last 72 hours. Fasting Lipid Panel  Recent Labs  03/20/16 0422  CHOL 147  HDL 61  LDLCALC 75  TRIG 54  CHOLHDL 2.4   Thyroid Function Tests No results for input(s): TSH, T4TOTAL, T3FREE, THYROIDAB in the last 72 hours.  Invalid input(s):  FREET3  Telemetry    Paced rhythm, HR in the 70s- Personally Reviewed  ECG    Atrial-paced rhythm with prolonged AV conduction- Personally Reviewed  Radiology    Post-Operative CXR  Dg Chest 2 View  Result Date: 03/22/2016 CLINICAL DATA:  82 year old male post pacemaker placement 03/21/2016. Subsequent encounter. EXAM: CHEST  2 VIEW COMPARISON:  03/18/2016 chest x-ray.  03/19/2016 chest CT. FINDINGS: Sequential pacemaker placed from left with leads in the region of the right atrium and right ventricle. No complication detected. Heart size within normal limits. Aortic calcification. CT detected tiny pulmonary nodules not well delineated on the present plain film exam. Please see prior CT report with regard to recommended follow-up. Right base subsegmental atelectasis/ blunting of the posterior sulci. Tiny pleural effusion not excluded. IMPRESSION: Sequential pacemaker placed from left with leads in the region of the right atrium and right ventricle. No complication detected. CT detected tiny pulmonary nodules not well delineated on the present plain film exam. Please see recent CT report with regard to recommended follow-up. Aortic atherosclerosis. Electronically Signed   By: Genia Del M.D.   On: 03/22/2016 08:14    Cardiac Studies   Device Interrogation Report: in paper chart. Normal functioning.   Patient Profile     82 yo male with Stage D severe symptomatic aortic stenosis and comorbid cardiac conditions of calcific high-grade CAD involving the RCA and left circumflex as well as paroxysmal atrial fibrillation with tachy brady syndrome requiring permanent pacemaker placement 03/21/16. Plan is for staged PCI of RCA and LCx, and likely TAVR in the near future.   Assessment & Plan     1. CAD: LHC 01/17/16 demonstrated calcific high-grade CAD involving the RCA and left circumflex. Recommendations are for staged PCI of the RCA and possibly the OM branches prior to TAVR since he has  severe 2 vessel CAD with a significant ischemic burden. Per Dr. Burt Knack, plan is for f/u in 2-3 weeks to arrange PCI. He will require rotational atherectomy in the presence of calcific coronary disease. He is currently CP free. For now, continue medical management with ASA and lisinopril. Consider addition of BB now that he has a PPM. His LDL is not at goal, currently at 75 mg/DL. He needs statin therapy. Consider Lipitor 40 mg nightly. Avoid nitrates given severe AS.   2. Severe Stage D Symptomatic Aortic Stenosis: Pt seen by Dr. Burt Knack in consultation yesterday. Plan is for TAVR, after PCI.   3. Paroxysmal Atrial Fibrillation: Paced rhythm on telemetry. Continue Amiodarone and Eliquis. He has been  receiving IV Cardizem for rate control. Given concomitant CAD, consider PO BB therapy for rate control.   4. Tachy Brady Syndrome/ Sinus Node Dysfunction: Day 1 s/p PPM insertion.   5. PPM: day 1 post-op Medtronic dual-chamber PPM for symptomatic bradycardia due to sinus node dysfunction per Dr. Lovena Le. Post-operative CXR demonstrates proper lead placement and no evidence of pneumothorax. Device interrogation reveals normal functioning. Device pocket stable w/o drainage. He will need wound check in device clinic in 7-10 days. Activity restriction and wound care instructions will be added to AVS.   6. Bilateral Carotid Artery Disease:  Carotid dopplers 03/18/16 demonstrated 1-39 percent stenosis involving the right internal carotid artery and 40-59% left internal carotid artery. He is asymptomatic. Continue ASA + BP control. Consider addition of statin for secondary prevention. Will need yearly f/u.   7. HLD: LDL is 75 mg/dL. He does not appear to be on a statin. Not listed in allergy list. CMP 03/18/16 showed normal hepatic function. He has severe 2V CAD and bilateral carotid artery disease. Will start Lipitor 40 mg. Recheck FLP and HFTs as outpatient in 6 weeks.   8. Pulmonary Nodules: incidentally seen on  recent CT. Per radiology report, multiple small pulmonary nodules scattered throughout the lungs bilaterally, measuring 7 mm or less in mean diameter. Many of these are subpleural in location, and are therefore favored to represent benign subpleural lymph nodes, however, these are all considered nonspecific. Non-contrast chest CT at 3-6 months is recommended.  Dispo: likely discharge home today after MD assessment.   Signed, Lyda Jester, PA-C  03/22/2016, 8:25 AM   EP Attending  Patient seen and examined. Agree with the findings as noted above. He is stable for DC home with followup with Dr. Burt Knack. His CXR and PPM interogation are good.  Cristopher Peru, M.D.

## 2016-03-22 NOTE — Discharge Summary (Signed)
Discharge Summary    Patient ID: Ryan [name],  MRN: [id number], DOB/AGE: [birth year] 80 y.o.  Admit date: 03/18/2016 Discharge date: 03/22/2016  Primary Care Provider: Velna Hatchet Primary Cardiologist: Dr. Meda Coffee Electrophysiologist: Dr. Karel Jarvis Clinic: Dr. Burt Knack   Discharge Diagnoses    Principal Problem:   PAF (paroxysmal atrial fibrillation) [facility]) Active Problems:   Acute on chronic diastolic heart failure (HCC)   Atrial fibrillation with RVR ([place])   Atrial fibrillation with rapid ventricular response (HCC)   Hypertensive heart disease with heart failure ([place])   Pure hypercholesterolemia   Tachy-brady syndrome (HCC)   Allergies Allergies  Allergen Reactions  . Hydrochlorothiazide Anaphylaxis  . Ace Inhibitors Cough  . Ciprofloxacin   . Losartan Cough  . Sulfa Antibiotics     Diagnostic Studies/Procedures    1. Pacemaker Implantation (see op note 03/21/16) 2. CT angio of chest/aorta, abd/pel, Chest CTA and carotid dopplers (see results in radiology studies below)    History of Present Illness     82 yo male with Stage D severe symptomatic aortic stenosis and comorbid cardiac conditions of calcific high-grade CAD involving the RCA and left circumflex as well as paroxysmal atrial fibrillation with tachy brady syndrome, admitted on 03/18/16 with worsening CP, in the setting of rapid atrial fibrillation.     Hospital Course     Patient was admitted to telemetry. He required IV Cardizem and ultimately IV amiodarone. He converted to SR but developed tachybrady syndrome, requiring PPM implantation on 03/21/16. His CP improved with control of his afib. He was seen by Dr. Burt Knack for consideration for TAVR. Dr. Burt Knack suspected that his episodes of atrial fibrillation was causing demand ischemia in the setting of CAD and aortic stenosis. Dr. Burt Knack personally reviewed the patient's cath and echo images and agreed that TAVR might be a reasonable treatment  option for him. Dr. Burt Knack recommend proceeding with PCI of the RCA and possibly the OM branches prior to TAVR since he has severe 2 vessel CAD with a significant ischemic burden. Plan is to have the patient return in 2-3 weeks to discuss staged PCI with Dr. Burt Knack. He was continued on medical therapy. He was discharged home on PO amiodarone, 400 mg BID and Eliquis.   In regards to his PPM, he had no post surgical complications. No CP or dyspnea. CXR revealed proper lead placement and no pneumothorax. Device pocket was stable. He was last seen and examined by Dr. Lovena Le, who determined he was stable for d/c home. He will have a wound check f/u in 7-10 days.    Consultants: [facility]   _____________  Discharge Vitals Blood pressure 121/67, pulse 60, temperature 97.9 F (36.6 C), temperature source Oral, resp. rate 15, height 5\' 7"  (1.702 m), weight 127 lb 12.8 oz (58 kg), SpO2 97 %.  Filed Weights   03/20/16 0400 03/21/16 0459 03/22/16 0408  Weight: 130 lb 1.6 oz (59 kg) 129 lb 12.8 oz (58.9 kg) 127 lb 12.8 oz (58 kg)    Labs & Radiologic Studies    CBC  Recent Labs  03/21/16 0544 03/22/16 0522  WBC 4.4 5.1  HGB 15.9 15.8  HCT 47.4 46.7  MCV 90.8 90.3  PLT 223 299   Basic Metabolic Panel  Recent Labs  03/21/16 0544 03/22/16 0522  NA 139 137  K 3.9 4.0  CL 110 108  CO2 22 23  GLUCOSE 83 95  BUN 13 17  CREATININE 1.07 1.09  CALCIUM 9.2 9.0   Liver Function Tests No results for input(s): AST, ALT, ALKPHOS, BILITOT, PROT, ALBUMIN in the last 72 hours. No results for input(s): LIPASE, AMYLASE in the last 72 hours. Cardiac Enzymes No results for input(s): CKTOTAL, CKMB, CKMBINDEX, TROPONINI in the last 72 hours. BNP Invalid input(s): POCBNP D-Dimer No results for input(s): DDIMER in the last 72 hours. Hemoglobin A1C No results for input(s): HGBA1C in the last 72 hours. Fasting Lipid Panel  Recent Labs  03/20/16 0422  CHOL 147  HDL 61    LDLCALC 75  TRIG 54  CHOLHDL 2.4   Thyroid Function Tests No results for input(s): TSH, T4TOTAL, T3FREE, THYROIDAB in the last 72 hours.  Invalid input(s): FREET3 _____________  Dg Chest 2 View  Result Date: 03/22/2016 CLINICAL DATA:  82 year old male post pacemaker placement 03/21/2016. Subsequent encounter. EXAM: CHEST  2 VIEW COMPARISON:  03/18/2016 chest x-ray.  03/19/2016 chest CT. FINDINGS: Sequential pacemaker placed from left with leads in the region of the right atrium and right ventricle. No complication detected. Heart size within normal limits. Aortic calcification. CT detected tiny pulmonary nodules not well delineated on the present plain film exam. Please see prior CT report with regard to recommended follow-up. Right base subsegmental atelectasis/ blunting of the posterior sulci. Tiny pleural effusion not excluded. IMPRESSION: Sequential pacemaker placed from left with leads in the region of the right atrium and right ventricle. No complication detected. CT detected tiny pulmonary nodules not well delineated on the present plain film exam. Please see recent CT report with regard to recommended follow-up. Aortic atherosclerosis. Electronically Signed   By: Genia Del M.D.   On: 03/22/2016 08:14   Ct Coronary Morph W/cta Cor W/score W/ca W/cm &/or Wo/cm  Addendum Date: 03/19/2016   ADDENDUM REPORT: 03/19/2016 17:46 CLINICAL DATA:  82 year old male with severe aortic stenosis. EXAM: Cardiac TAVR CT TECHNIQUE: The patient was scanned on a Philips 256 scanner. A 120 kV retrospective scan was triggered in the descending thoracic aorta at 111 HU's. Gantry rotation speed was 270 msecs and collimation was .9 mm. 10 mg of iv Metoprolol and no nitro were given. The 3D data set was reconstructed in 5% intervals of the R-R cycle. Systolic and diastolic phases were analyzed on a dedicated work station using MPR, MIP and VRT modes. The patient received 80 cc of contrast. FINDINGS: Aortic  Valve: Trileaflet, severely thickened and calcified with severely restricted leaflet opening. There is minimal calcification extending into the LVOT. Aorta:  Normal size, moderate diffuse calcifications, no dissection. Sinotubular Junction:  31 x 31 mm Ascending Thoracic Aorta:  35 x 34 mm Aortic Arch:  25 x 25 mm Descending Thoracic Aorta:  23 x 23 mm Sinus of Valsalva Measurements: Non-coronary:  35 mm Right -coronary:  36 mm Left -coronary:  36 mm Coronary Artery Height above Annulus: Left Main:  11 mm Right Coronary:  12 mm Virtual Basal Annulus Measurements: Maximum/Minimum Diameter:  27 x 26 mm Perimeter:  82 mm Area:  504 mm2 Optimum Fluoroscopic Angle for Delivery:  LAO 8 CAU 7 Other findings: A large left atrial appendage without evidence of a thrombus. Normal pulmonary vein drainage into the left atrium. Dilated pulmonary artery measuring 34 x 25 mm. IMPRESSION: 1. Trileaflet, severely thickened and calcified aortic valve with severely restricted leaflet opening and annular measurements suitable for delivery of a 26 mm Edwards-SAPIEN 3 valve. 2. Sufficient annulus to coronary distance. 3. Optimum Fluoroscopic Angle for Delivery:  LAO 8 CAU 7. 4.  A large left atrial appendage without evidence of a thrombus 5. Dilated pulmonary artery measuring 34 x 25 mm Ena Dawley Electronically Signed   By: Ena Dawley   On: 03/19/2016 17:46   Result Date: 03/19/2016 EXAM: OVER-READ INTERPRETATION  CT CHEST The following report is an over-read performed by radiologist Dr. Rebekah [facility] on 03/19/2016. This over-read does not include interpretation of cardiac or coronary anatomy or pathology. The coronary calcium score/coronary CTA interpretation by the cardiologist is attached. COMPARISON:  None. FINDINGS: Full description of extracardiac findings will be dictated separately a under requisition for contemporaneously obtained CTA of the chest, abdomen and pelvis 03/19/2016.  IMPRESSION: Please see separate dictation for contemporaneously obtained CTA of the chest, abdomen and pelvis 03/19/2016 for full description of extracardiac findings. Electronically Signed: By: Vinnie Langton M.D. On: 03/19/2016 16:53   Dg Chest Port 1 View  Result Date: 03/18/2016 CLINICAL DATA:  Episode of palpitations, chest pressure and fatigue at doctor's office today, worsening symptoms, history hypertension, prostate cancer, coronary artery disease, carotid artery disease, bladder cancer EXAM: PORTABLE CHEST 1 VIEW COMPARISON:  Portable exam 1150 hours without priors for comparison FINDINGS: Upper normal heart size. Calcified and elongated thoracic aorta. Mediastinal contours and pulmonary vascularity normal. Lungs clear. No pleural effusion or pneumothorax. Bones demineralized. IMPRESSION: No acute abnormalities. Aortic atherosclerosis. Electronically Signed   By: Lavonia Dana M.D.   On: 03/18/2016 12:09   Ct Angio Chest Aorta W/cm &/or Wo/cm  Result Date: 03/20/2016 CLINICAL DATA:  82 year old male with history of severe aortic stenosis. Preprocedural study prior to potential transcatheter aortic valve replacement (TAVR). Chest tightness and sensation of racing heart beat. Fatigue yesterday. EXAM: CT ANGIOGRAPHY CHEST, ABDOMEN AND PELVIS TECHNIQUE: Multidetector CT imaging through the chest, abdomen and pelvis was performed using the standard protocol during bolus administration of intravenous contrast. Multiplanar reconstructed images and MIPs were obtained and reviewed to evaluate the vascular anatomy. CONTRAST:  70 mL of Isovue 370. COMPARISON:  No priors. FINDINGS: CTA CHEST FINDINGS Cardiovascular: Heart size is mildly enlarged. There is no significant pericardial fluid, thickening or pericardial calcification. There is aortic atherosclerosis, as well as atherosclerosis of the great vessels of the mediastinum and the coronary arteries, including calcified atherosclerotic plaque in the left  main, left anterior descending, left circumflex and right coronary arteries. Severe thickening calcification of the aortic valve. Mediastinum/Lymph Nodes: No pathologically enlarged mediastinal or hilar lymph nodes. Densely calcified right hilar lymph node incidentally noted. Esophagus is unremarkable in appearance. No axillary lymphadenopathy. Lungs/Pleura: There are several pulmonary nodules scattered throughout the lungs bilaterally, the largest of which measures up to 8 x 6 mm (mean diameter of 7 mm) in the subpleural aspect of the right lower lobe (image 41 of series 407). These are nonspecific, but most of these are subpleural in location, likely to represent subpleural lymph nodes. Areas of scarring and/or subsegmental atelectasis are noted in the lung bases bilaterally, particularly in the right lower lobe. No acute consolidative airspace disease. No pleural effusions. Small right-sided Bochdalek's hernia incidentally noted. Musculoskeletal/Soft Tissues: There are no aggressive appearing lytic or blastic lesions noted in the visualized portions of the skeleton. CTA ABDOMEN AND PELVIS FINDINGS Hepatobiliary: Several sub cm well-defined low-attenuation lesions are noted in the liver, largest of which measures up to 9 mm in segment 4A. These are too small to definitively characterize, but are favored to represent tiny cysts. No other definite suspicious appearing hepatic lesions are noted. There is no intra or extrahepatic  biliary ductal dilatation. Gallbladder is normal in appearance. Pancreas: No pancreatic mass. No pancreatic ductal dilatation. No pancreatic or peripancreatic fluid or inflammatory changes. Spleen: Unremarkable. Adrenals/Urinary Tract: Sub cm low-attenuation lesions in the left kidney are too small to definitively characterize, but are statistically likely tiny cysts. Right kidney and bilateral adrenal glands are normal in appearance. No hydroureteronephrosis. Urinary bladder is normal in  appearance. Stomach/Bowel: The appearance of the stomach is normal. There is no pathologic dilatation of small bowel or colon. The appendix is not confidently identified and may be surgically absent. Regardless, there are no inflammatory changes noted adjacent to the cecum to suggest the presence of an acute appendicitis at this time. Vascular/Lymphatic: Vascular findings and measurements pertinent to potential TAVR procedure, as detailed below. No aneurysm or dissection identified in the abdominal or pelvic vasculature. The celiac axis, superior mesenteric artery and inferior mesenteric artery all appear widely patent, without hemodynamically significant stenosis. There are single renal arteries bilaterally which are both widely patent. There is complete inclusion of the right internal iliac artery proximally, which is subsequently reconstituted distally from collateral pathways. No lymphadenopathy noted in the abdomen or pelvis. Reproductive: Fiducial markers associated with the prostate gland. Prostate gland and seminal vesicles are otherwise unremarkable in appearance. Other: Low-attenuation material in the right inguinal region, presumably a Prolene plug from prior herniorrhaphy. No significant volume of ascites. No pneumoperitoneum. Musculoskeletal: Bilateral pars defects at L5 with 14 mm of anterolisthesis of L5 upon S1. There are no aggressive appearing lytic or blastic lesions noted in the visualized portions of the skeleton. VASCULAR MEASUREMENTS PERTINENT TO TAVR: AORTA: Minimal Aortic Diameter -  13 x 14 mm Severity of Aortic Calcification -  moderate RIGHT PELVIS: Right Common Iliac Artery - Minimal Diameter - 7.7 x 6.4 mm Tortuosity - mild Calcification - moderate Right External Iliac Artery - Minimal Diameter - 7.2 x 7.1 mm Tortuosity - severe Calcification - none Right Common Femoral Artery - Minimal Diameter - 6.9 x 8.0 mm Tortuosity - mild Calcification - none LEFT PELVIS: Left Common Iliac Artery  - Minimal Diameter - 6.1 x 7.3 mm Tortuosity - moderate Calcification - mild Left External Iliac Artery - Minimal Diameter - 6.8 x 6.9 mm Tortuosity - severe Calcification - none Left Common Femoral Artery - Minimal Diameter - 6.5 x 7.9 mm Tortuosity - mild Calcification - mild Review of the MIP images confirms the above findings. IMPRESSION: 1. Vascular findings and measurements pertinent to potential TAVR procedure, as above. This patient appears to have suitable pelvic arterial access bilaterally from a size perspective, although there is extensive tortuosity bilaterally, particularly in the external iliac arteries, as detailed above. 2. Severe thickening calcification of the aortic valve, compatible with the clinical history of severe aortic stenosis. 3. Aortic atherosclerosis, in addition to left main and 3 vessel coronary artery disease. 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 4. Mild cardiomegaly. 5. Multiple small pulmonary nodules scattered throughout the lungs bilaterally, measuring 7 mm or less in mean diameter. Many of these are subpleural in location, and are therefore favored to represent benign subpleural lymph nodes, however, these are all considered nonspecific. Non-contrast chest CT at 3-6 months is recommended. If the nodules are stable at time of repeat CT, then future CT at 18-24 months (from today's scan) is considered optional for low-risk patients, but is recommended for high-risk patients. This recommendation follows  the consensus statement: Guidelines for Management of Incidental Pulmonary Nodules Detected on CT Images: From the Fleischner Society 2017; Radiology 2017; 284:228-243. 6. Additional incidental findings, as above. Electronically Signed   By: Vinnie Langton M.D.   On: 03/20/2016 10:19   Ct Angio Abd/pel W/ And/or W/o  Result Date:  03/20/2016 CLINICAL DATA:  82 year old male with history of severe aortic stenosis. Preprocedural study prior to potential transcatheter aortic valve replacement (TAVR). Chest tightness and sensation of racing heart beat. Fatigue yesterday. EXAM: CT ANGIOGRAPHY CHEST, ABDOMEN AND PELVIS TECHNIQUE: Multidetector CT imaging through the chest, abdomen and pelvis was performed using the standard protocol during bolus administration of intravenous contrast. Multiplanar reconstructed images and MIPs were obtained and reviewed to evaluate the vascular anatomy. CONTRAST:  70 mL of Isovue 370. COMPARISON:  No priors. FINDINGS: CTA CHEST FINDINGS Cardiovascular: Heart size is mildly enlarged. There is no significant pericardial fluid, thickening or pericardial calcification. There is aortic atherosclerosis, as well as atherosclerosis of the great vessels of the mediastinum and the coronary arteries, including calcified atherosclerotic plaque in the left main, left anterior descending, left circumflex and right coronary arteries. Severe thickening calcification of the aortic valve. Mediastinum/Lymph Nodes: No pathologically enlarged mediastinal or hilar lymph nodes. Densely calcified right hilar lymph node incidentally noted. Esophagus is unremarkable in appearance. No axillary lymphadenopathy. Lungs/Pleura: There are several pulmonary nodules scattered throughout the lungs bilaterally, the largest of which measures up to 8 x 6 mm (mean diameter of 7 mm) in the subpleural aspect of the right lower lobe (image 41 of series 407). These are nonspecific, but most of these are subpleural in location, likely to represent subpleural lymph nodes. Areas of scarring and/or subsegmental atelectasis are noted in the lung bases bilaterally, particularly in the right lower lobe. No acute consolidative airspace disease. No pleural effusions. Small right-sided Bochdalek's hernia incidentally noted. Musculoskeletal/Soft Tissues: There are no  aggressive appearing lytic or blastic lesions noted in the visualized portions of the skeleton. CTA ABDOMEN AND PELVIS FINDINGS Hepatobiliary: Several sub cm well-defined low-attenuation lesions are noted in the liver, largest of which measures up to 9 mm in segment 4A. These are too small to definitively characterize, but are favored to represent tiny cysts. No other definite suspicious appearing hepatic lesions are noted. There is no intra or extrahepatic biliary ductal dilatation. Gallbladder is normal in appearance. Pancreas: No pancreatic mass. No pancreatic ductal dilatation. No pancreatic or peripancreatic fluid or inflammatory changes. Spleen: Unremarkable. Adrenals/Urinary Tract: Sub cm low-attenuation lesions in the left kidney are too small to definitively characterize, but are statistically likely tiny cysts. Right kidney and bilateral adrenal glands are normal in appearance. No hydroureteronephrosis. Urinary bladder is normal in appearance. Stomach/Bowel: The appearance of the stomach is normal. There is no pathologic dilatation of small bowel or colon. The appendix is not confidently identified and may be surgically absent. Regardless, there are no inflammatory changes noted adjacent to the cecum to suggest the presence of an acute appendicitis at this time. Vascular/Lymphatic: Vascular findings and measurements pertinent to potential TAVR procedure, as detailed below. No aneurysm or dissection identified in the abdominal or pelvic vasculature. The celiac axis, superior mesenteric artery and inferior mesenteric artery all appear widely patent, without hemodynamically significant stenosis. There are single renal arteries bilaterally which are both widely patent. There is complete inclusion of the right internal iliac artery proximally, which is subsequently reconstituted distally from collateral pathways. No lymphadenopathy noted in the abdomen or pelvis. Reproductive: Fiducial markers associated with  the prostate gland. Prostate gland and seminal vesicles are otherwise unremarkable in appearance. Other: Low-attenuation material in the right inguinal region, presumably a Prolene plug from prior herniorrhaphy. No significant volume of ascites. No pneumoperitoneum. Musculoskeletal: Bilateral pars defects at L5 with 14 mm of anterolisthesis of L5 upon S1. There are no aggressive appearing lytic or blastic lesions noted in the visualized portions of the skeleton. VASCULAR MEASUREMENTS PERTINENT TO TAVR: AORTA: Minimal Aortic Diameter -  13 x 14 mm Severity of Aortic Calcification -  moderate RIGHT PELVIS: Right Common Iliac Artery - Minimal Diameter - 7.7 x 6.4 mm Tortuosity - mild Calcification - moderate Right External Iliac Artery - Minimal Diameter - 7.2 x 7.1 mm Tortuosity - severe Calcification - none Right Common Femoral Artery - Minimal Diameter - 6.9 x 8.0 mm Tortuosity - mild Calcification - none LEFT PELVIS: Left Common Iliac Artery - Minimal Diameter - 6.1 x 7.3 mm Tortuosity - moderate Calcification - mild Left External Iliac Artery - Minimal Diameter - 6.8 x 6.9 mm Tortuosity - severe Calcification - none Left Common Femoral Artery - Minimal Diameter - 6.5 x 7.9 mm Tortuosity - mild Calcification - mild Review of the MIP images confirms the above findings. IMPRESSION: 1. Vascular findings and measurements pertinent to potential TAVR procedure, as above. This patient appears to have suitable pelvic arterial access bilaterally from a size perspective, although there is extensive tortuosity bilaterally, particularly in the external iliac arteries, as detailed above. 2. Severe thickening calcification of the aortic valve, compatible with the clinical history of severe aortic stenosis. 3. Aortic atherosclerosis, in addition to left main and 3 vessel coronary artery disease. In addition, there is complete occlusion of the proximal right internal iliac artery, with reconstitution of distal flow in the vessel  and its branches from collateral pathways. 4. Mild cardiomegaly. 5. Multiple small pulmonary nodules scattered throughout the lungs bilaterally, measuring 7 mm or less in mean diameter. Many of these are subpleural in location, and are therefore favored to represent benign subpleural lymph nodes, however, these are all considered nonspecific. Non-contrast chest CT at 3-6 months is recommended. If the nodules are stable at time of repeat CT, then future CT at 18-24 months (from today's scan) is considered optional for low-risk patients, but is recommended for high-risk patients. This recommendation follows the consensus statement: Guidelines for Management of Incidental Pulmonary Nodules Detected on CT Images: From the Fleischner Society 2017; Radiology 2017; 284:228-243. 6. Additional incidental findings, as above. Electronically Signed   By: Vinnie Langton M.D.   On: 03/20/2016 10:19   Disposition   Pt is being discharged home today in good condition.  Follow-up Plans & Appointments    Follow-up [place], [place]. Go on 03/21/2016.   Specialty:  Cardiothoracic Surgery Contact information: [address] [place] [address] [phone]        Sherren Mocha, MD .   Specialty:  Cardiology Why:  Our office will call you with a follow-up vist with Dr. Burt Knack to discuss plans for cardiac catheterization for intervention Contact information: [address] [address] [phone]        [place] .   Specialty:  Cardiology Why:  or office will call you with an appointment for a wound check with the pacemaker clinic  Contact information: [address] [place] [phone]         Discharge Instructions  Diet - low sodium heart healthy    Complete by:  As directed    Increase activity slowly    Complete by:  As directed       Discharge Medications    Current Discharge Medication List    START taking these medications   Details  amiodarone (PACERONE) 400 MG tablet Take 1 tablet (400 mg total) by mouth 2 (two) times daily. Qty: 60 tablet, Refills: 3    !! apixaban (ELIQUIS) 2.5 MG TABS tablet Take 1 tablet (2.5 mg total) by mouth 2 (two) times daily. Qty: 60 tablet, Refills: 10    !! apixaban (ELIQUIS) 2.5 MG TABS tablet Take 1 tablet (2.5 mg total) by mouth 2 (two) times daily. Qty: 60 tablet, Refills: 0    atorvastatin (LIPITOR) 40 MG tablet Take 1 tablet (40 mg total) by mouth daily. Qty: 30 tablet, Refills: 5     !! - Potential duplicate medications found. Please discuss with provider.    CONTINUE these medications which have NOT CHANGED   Details  aspirin 81 MG tablet Take 81 mg by mouth daily.    cholecalciferol (VITAMIN D) 1000 units tablet Take 1,000 Units by mouth daily.    Coenzyme Q10 (COQ-10 PO) Take 1 capsule by mouth daily.    latanoprost (XALATAN) 0.005 % ophthalmic solution Place 1 drop into both eyes at bedtime.    lisinopril (PRINIVIL,ZESTRIL) 20 MG tablet Take 20 mg by mouth daily.    omeprazole (PRILOSEC) 20 MG capsule Take 20 mg by mouth daily.      STOP taking these medications     amLODipine (NORVASC) 10 MG tablet          Aspirin prescribed at discharge?  Yes High Intensity Statin Prescribed? (Lipitor 40-80mg  or Crestor 20-40mg ): Yes Beta Blocker Prescribed? No:  For EF <40%, was ACEI/ARB Prescribed? Yes ADP Receptor Inhibitor Prescribed? (i.e. Plavix etc.-Includes Medically Managed Patients): Yes For EF <40%, Aldosterone Inhibitor Prescribed? No:  Was EF assessed during THIS hospitalization? Yes Was Cardiac Rehab II ordered? (Included Medically managed Patients): No:    Outstanding Labs/Studies   Staged PCI. Date TBD.   Duration of Discharge Encounter   Greater than 30 minutes including physician time.  Signed, Lyda Jester PA-C  03/22/2016, 11:23 AM  EP  attending  Patient seen and examined. Agree with above. See my note as well. He is stable for DC home. Usual followup. Will continue systemic anti-coag.  Mikle Bosworth.D.

## 2016-03-24 ENCOUNTER — Encounter: Payer: Self-pay | Admitting: Internal Medicine

## 2016-03-24 ENCOUNTER — Encounter: Payer: Medicare Other | Admitting: Thoracic Surgery (Cardiothoracic Vascular Surgery)

## 2016-03-24 ENCOUNTER — Ambulatory Visit (INDEPENDENT_AMBULATORY_CARE_PROVIDER_SITE_OTHER): Payer: Medicare Other | Admitting: *Deleted

## 2016-03-24 ENCOUNTER — Telehealth: Payer: Self-pay | Admitting: Internal Medicine

## 2016-03-24 ENCOUNTER — Encounter (HOSPITAL_COMMUNITY): Payer: Self-pay | Admitting: Internal Medicine

## 2016-03-24 VITALS — BP 157/70 | HR 60

## 2016-03-24 DIAGNOSIS — Z950 Presence of cardiac pacemaker: Secondary | ICD-10-CM

## 2016-03-24 LAB — CUP PACEART INCLINIC DEVICE CHECK
Battery Remaining Longevity: 137 mo
Battery Voltage: 2.8 V
Brady Statistic AP VP Percent: 0 %
Brady Statistic AS VP Percent: 0 %
Implantable Lead Implant Date: 20171020
Implantable Lead Implant Date: 20171020
Implantable Lead Location: 753859
Implantable Lead Model: 5076
Implantable Lead Model: 5076
Lead Channel Impedance Value: 543 Ohm
Lead Channel Pacing Threshold Amplitude: 0.5 V
Lead Channel Pacing Threshold Pulse Width: 0.4 ms
Lead Channel Setting Pacing Amplitude: 3.5 V
Lead Channel Setting Sensing Sensitivity: 4 mV
MDC IDC LEAD LOCATION: 753860
MDC IDC MSMT BATTERY IMPEDANCE: 100 Ohm
MDC IDC MSMT LEADCHNL RA SENSING INTR AMPL: 2.8 mV
MDC IDC MSMT LEADCHNL RV IMPEDANCE VALUE: 711 Ohm
MDC IDC MSMT LEADCHNL RV PACING THRESHOLD AMPLITUDE: 0.75 V
MDC IDC MSMT LEADCHNL RV PACING THRESHOLD PULSEWIDTH: 0.4 ms
MDC IDC MSMT LEADCHNL RV SENSING INTR AMPL: 8 mV
MDC IDC SESS DTM: 20171023180859
MDC IDC SET LEADCHNL RA PACING AMPLITUDE: 3.5 V
MDC IDC SET LEADCHNL RV PACING PULSEWIDTH: 0.4 ms
MDC IDC STAT BRADY AP VS PERCENT: 74 %
MDC IDC STAT BRADY AS VS PERCENT: 26 %

## 2016-03-24 NOTE — Telephone Encounter (Signed)
Pt with hematoma and bruising s/p PPM implant. Appt made for 2:30 today to evaluate pacemaker site. Dr Lovena Le advised patient to hold Eliquis last night and this morning.   Chanetta Marshall, NP 03/24/2016 12:00 PM

## 2016-03-24 NOTE — Progress Notes (Signed)
Wound check appointment, added-on for post-pressure dressing removal hematoma assessment. Eliquis on hold per GT order since 10/22 AM dose. Steri-strips remain in place until scheduled wound check. Extensive ecchymosis present, site soft to palpation. Verbally reviewed with GT--continue to hold Eliquis until 03/31/16 appointment with GT and call if worsening prior to then. Patient and wife verbalize understanding of instructions.  Device checked due to patient reporting transient dizziness, denies syncope. B/P 157/70 today. Device function stable. Thresholds, sensing, and impedances consistent with implant measurements. Device programmed at 3.5V with auto capture programmed on for extra safety margin until 3 month visit. Histogram distribution appropriate for patient and level of activity. No mode switches or high ventricular rates noted. Patient educated about wound care, arm mobility, lifting restrictions. ROV with GT on 03/31/16.   Obtained patient's permission to include following photo:

## 2016-03-24 NOTE — Patient Instructions (Addendum)
Call the [facility] at [phone] if you develop any signs/syptoms of infection or worsening bleeding, including drainage, increased swelling or pain at site, fever, or chills.   Hold Eliquis until after 03/31/16 appointment with Dr. Lovena Le.

## 2016-03-24 NOTE — Telephone Encounter (Signed)
New message   Pt wife verbalized that Dr.Taylor wants her husband to have an appt in the device clinic today 03/24/16 please call pt wife

## 2016-03-24 NOTE — Telephone Encounter (Signed)
Pt had PPM placed on 03/21/16. Pt experiencing bruising. Dr. Lovena Le recommended pt be seen today in [facility]. Will forward to [facility] to schedule.

## 2016-03-31 ENCOUNTER — Encounter: Payer: Self-pay | Admitting: Internal Medicine

## 2016-03-31 ENCOUNTER — Ambulatory Visit (INDEPENDENT_AMBULATORY_CARE_PROVIDER_SITE_OTHER): Payer: Medicare Other | Admitting: Internal Medicine

## 2016-03-31 VITALS — BP 150/72 | HR 64 | Ht 67.0 in | Wt 133.6 lb

## 2016-03-31 DIAGNOSIS — I495 Sick sinus syndrome: Secondary | ICD-10-CM | POA: Diagnosis not present

## 2016-03-31 MED ORDER — AMIODARONE HCL 200 MG PO TABS: 200.0000 mg | ORAL_TABLET | Freq: Every day | ORAL | 3 refills | Status: DC

## 2016-03-31 NOTE — Progress Notes (Signed)
HPI [name] returns today for followup of symptomatic tachy-brady, s/p PPM insertion. After his device implant, he had a large pocket hematoma on his NOAC. His Eliquis was stopped. He returns for followup. He denies chest pain. No syncope. No palpitations. Allergies  Allergen Reactions  . Hydrochlorothiazide Anaphylaxis  . Ace Inhibitors Cough  . Ciprofloxacin     unknown  . Losartan Cough  . Sulfa Antibiotics     unknown     Current Outpatient Prescriptions  Medication Sig Dispense Refill  . aspirin 81 MG tablet Take 81 mg by mouth daily.    Marland Kitchen atorvastatin (LIPITOR) 40 MG tablet Take 1 tablet (40 mg total) by mouth daily. 30 tablet 5  . cholecalciferol (VITAMIN D) 1000 units tablet Take 1,000 Units by mouth daily.    . Coenzyme Q10 (COQ-10 PO) Take 1 capsule by mouth daily.     Marland Kitchen latanoprost (XALATAN) 0.005 % ophthalmic solution Place 1 drop into both eyes at bedtime.    Marland Kitchen lisinopril (PRINIVIL,ZESTRIL) 20 MG tablet Take 20 mg by mouth daily.    Marland Kitchen amiodarone (PACERONE) 200 MG tablet Take 1 tablet (200 mg total) by mouth daily. 90 tablet 3  . apixaban (ELIQUIS) 2.5 MG TABS tablet Take 1 tablet (2.5 mg total) by mouth 2 (two) times daily. (Patient not taking: Reported on 03/31/2016) 60 tablet 10   No current facility-administered medications for this visit.      Past Medical History:  Diagnosis Date  . Age-related macular degeneration   . Allergic rhinitis   . Aortic valve stenosis   . Carotid artery obstruction   . Chronic lower back pain   . Coronary arteriosclerosis in native artery   . Diverticulitis of colon   . DJD (degenerative joint disease)   . Dysthymia   . History of elevated PSA 05/2009  . HLD (hyperlipidemia)   . Hypertension   . Nocturnal hypoxemia   . OSA (obstructive sleep apnea)    intolerant to CPAP  . Paroxysmal supraventricular tachycardia ([place])   . Primary malignant neoplasm of bladder ([place])   . Prostate cancer ([place])     ROS:   All  systems reviewed and negative except as noted in the HPI.   Past Surgical History:  Procedure Laterality Date  . ABDOMINAL SURGERY  1981   Meckles Diverticulum with volvulus  . APPENDECTOMY    . CARDIAC CATHETERIZATION N/A 01/17/2016   Procedure: Right/Left Heart Cath and Coronary Angiography;  Surgeon: Belva Crome, MD;  Location: [facility];  Service: Cardiovascular;  Laterality: N/A;  . CAROTID ENDARTERECTOMY     bilateral  . EP IMPLANTABLE DEVICE N/A 03/21/2016   Procedure: Pacemaker Implant;  Surgeon: Evans Lance, MD;  Location: [facility];  Service: Cardiovascular;  Laterality: N/A;  . HERNIA REPAIR Right 2009     Family History  Problem Relation Age of Onset  . Heart disease Mother   . Heart disease Father      Social History   Social History  . Marital status: Married    Spouse name: N/A  . Number of children: N/A  . Years of education: N/A   Occupational History  . retired    Social History Main Topics  . Smoking status: Never Smoker  . Smokeless tobacco: Never Used  . Alcohol use No  . Drug use: No  . Sexual activity: Not on file   Other Topics Concern  . Not on file  Social History Narrative  . No narrative on file     BP (!) 150/72   Pulse 64   Ht 5\' 7"  (1.702 m)   Wt 133 lb 9.6 oz (60.6 kg)   BMI 20.92 kg/m   Physical Exam:  Well appearing NAD HEENT: Unremarkable Neck:  No JVD, no thyromegally Lymphatics:  No adenopathy Back:  No CVA tenderness Lungs:  Clear with no wheezes. HEART:  Regular rate rhythm, no murmurs, no rubs, no clicks Abd:  soft, positive bowel sounds, no organomegally, no rebound, no guarding Ext:  2 plus pulses, no edema, no cyanosis, no clubbing Skin:  No rashes no nodules Neuro:  CN II through XII intact, motor grossly intact  DEVICE  Normal device function.  See PaceArt for details.   Assess/Plan: 1. PAF - he is maintaining NSR. Will follow 2. PPM - his hematoma has improved markedly. He  will restart his anti-coagulation 3. HTN - his blood pressure is up a little. Will follow.  Mikle Bosworth.D.

## 2016-03-31 NOTE — Patient Instructions (Addendum)
Medication Instructions:    Your physician has recommended you make the following change in your medication:   1) RESTART Eliquis  --- If you need a refill on your cardiac medications before your next appointment, please call your pharmacy. ---  Labwork:  None ordered  Testing/Procedures:  None ordered  Follow-Up:  Your physician recommends that you schedule a follow-up appointment in: 3 months with Dr. Lovena Le.   Thank you for choosing [facility]!!

## 2016-04-03 ENCOUNTER — Ambulatory Visit: Payer: Medicare Other

## 2016-04-18 ENCOUNTER — Ambulatory Visit (INDEPENDENT_AMBULATORY_CARE_PROVIDER_SITE_OTHER): Payer: Medicare Other | Admitting: Cardiovascular Disease

## 2016-04-18 ENCOUNTER — Encounter: Payer: Self-pay | Admitting: Cardiovascular Disease

## 2016-04-18 VITALS — BP 154/64 | HR 68 | Ht 67.0 in | Wt 131.4 lb

## 2016-04-18 DIAGNOSIS — I350 Nonrheumatic aortic (valve) stenosis: Secondary | ICD-10-CM | POA: Diagnosis not present

## 2016-04-18 MED ORDER — CLOPIDOGREL BISULFATE 75 MG PO TABS: 75.0000 mg | ORAL_TABLET | Freq: Every day | ORAL | 3 refills | Status: DC

## 2016-04-18 NOTE — Progress Notes (Signed)
Cardiology Office Note Date:  04/20/2016   ID:  Ryan [name], DOB Mar 31, 1930, MRN [id number]  PCP:  Velna Hatchet, MD  Cardiologist:  Sherren Mocha, MD    Chief Complaint  Patient presents with  . Follow-up     History of Present Illness: Ryan [name] is a 82 y.o. male who presents for follow-up of aortic stenosis and CAD. The patient was recently hospitalized with atrial fibrillation with RVR, then tachy-brady syndrome requiring PPM placement 03-21-2016. He had complained of progressive fatigue and exercise intolerance over a period of 3-4 months. He is here with his wife today to discuss ongoing treatment options.   His pacemaker implantation was complicated by a pocket hematoma but he is now back on Eliquis for anticoagulation. He continues to complain of exertional dyspnea and fatigue with low-level activity. No chest pain, chest pressure, lightheadedness, or syncope. Has not appreciated any symptomatic improvement with pacemaker implantation and eager to move forward with treatment of aortic stenosis.  He has undergone cardiac catheterization, CTA studies, and surgical evaluation with Dr Roxy Manns.   Past Medical History:  Diagnosis Date  . Age-related macular degeneration   . Allergic rhinitis   . Aortic valve stenosis   . Carotid artery obstruction   . Chronic lower back pain   . Coronary arteriosclerosis in native artery   . Diverticulitis of colon   . DJD (degenerative joint disease)   . Dysthymia   . History of elevated PSA 05/2009  . HLD (hyperlipidemia)   . Hypertension   . Nocturnal hypoxemia   . OSA (obstructive sleep apnea)    intolerant to CPAP  . Paroxysmal supraventricular tachycardia ([place])   . Primary malignant neoplasm of bladder ([place])   . Prostate cancer [facility])     Past Surgical History:  Procedure Laterality Date  . ABDOMINAL SURGERY  1981   Meckles Diverticulum with volvulus  . APPENDECTOMY    . CARDIAC CATHETERIZATION N/A 01/17/2016   Procedure: Right/Left Heart Cath and Coronary Angiography;  Surgeon: Belva Crome, MD;  Location: [facility];  Service: Cardiovascular;  Laterality: N/A;  . CAROTID ENDARTERECTOMY     bilateral  . EP IMPLANTABLE DEVICE N/A 03/21/2016   Procedure: Pacemaker Implant;  Surgeon: Evans Lance, MD;  Location: [facility];  Service: Cardiovascular;  Laterality: N/A;  . HERNIA REPAIR Right 2009    Current Outpatient Prescriptions  Medication Sig Dispense Refill  . amiodarone (PACERONE) 200 MG tablet Take 1 tablet (200 mg total) by mouth daily. 90 tablet 3  . apixaban (ELIQUIS) 2.5 MG TABS tablet Take 1 tablet (2.5 mg total) by mouth 2 (two) times daily. 60 tablet 10  . aspirin 81 MG tablet Take 81 mg by mouth daily.    Marland Kitchen atorvastatin (LIPITOR) 40 MG tablet Take 1 tablet (40 mg total) by mouth daily. 30 tablet 5  . cholecalciferol (VITAMIN D) 1000 units tablet Take 1,000 Units by mouth 2 (two) times a week.     . Coenzyme Q10 (COQ-10 PO) Take 1 capsule by mouth 2 (two) times a week.     . latanoprost (XALATAN) 0.005 % ophthalmic solution Place 1 drop into both eyes at bedtime.    Marland Kitchen lisinopril (PRINIVIL,ZESTRIL) 20 MG tablet Take 20 mg by mouth daily.    . clopidogrel (PLAVIX) 75 MG tablet Take 1 tablet (75 mg total) by mouth daily. 90 tablet 3   No current facility-administered medications for this visit.     Allergies:  Hydrochlorothiazide; Ace inhibitors; Ciprofloxacin; Losartan; and Sulfa antibiotics   Social History:  The patient  reports that he has never smoked. He has never used smokeless tobacco. He reports that he does not drink alcohol or use drugs.   Family History:  The patient's  family history includes Heart disease in his father and mother.    ROS:  Please see the history of present illness. All other systems are reviewed and negative.    PHYSICAL EXAM: VS:  BP (!) 154/64   Pulse 68   Ht 5\' 7"  (1.702 m)   Wt 131 lb 6.4 oz (59.6 kg)   BMI 20.58 kg/m  ,  BMI Body mass index is 20.58 kg/m. GEN: Thin, elderly male, in no acute distress  HEENT: normal  Neck: no JVD, no masses. bilateral carotid bruits Cardiac: RRR with 3/6 harsh late-peaking systolic murmur at the RUSB                Respiratory:  clear to auscultation bilaterally, normal work of breathing GI: soft, nontender, nondistended, + BS MS: no deformity or atrophy  Ext: no pretibial edema, pedal pulses 2+= bilaterally Skin: warm and dry, no rash Neuro:  Strength and sensation are intact Psych: euthymic mood, full affect  EKG:  EKG is not ordered today.  Recent Labs: 03/18/2016: ALT 19; B Natriuretic Peptide 38.4; TSH 2.194 03/22/2016: BUN 17; Creatinine, Ser 1.09; Hemoglobin 15.8; Platelets 197; Potassium 4.0; Sodium 137   Lipid Panel     Component Value Date/Time   CHOL 147 03/20/2016 0422   TRIG 54 03/20/2016 0422   HDL 61 03/20/2016 0422   CHOLHDL 2.4 03/20/2016 0422   VLDL 11 03/20/2016 0422   LDLCALC 75 03/20/2016 0422      Wt Readings from Last 3 Encounters:  04/18/16 131 lb 6.4 oz (59.6 kg)  03/31/16 133 lb 9.6 oz (60.6 kg)  03/22/16 127 lb 12.8 oz (58 kg)     Cardiac Studies Reviewed: Echo 10-10-2015: Study Conclusions  - Left ventricle: The cavity size was normal. Wall thickness was   increased in a pattern of mild LVH. Systolic function was normal.   The estimated ejection fraction was in the range of 60% to 65%.   Wall motion was normal; there were no regional wall motion   abnormalities. Doppler parameters are consistent with abnormal   left ventricular relaxation (grade 1 diastolic dysfunction).   Doppler parameters are consistent with indeterminate ventricular   filling pressure. - Aortic valve: Valve mobility was restricted. There was moderate   stenosis. Peak velocity (S): 387.9 cm/s. Mean gradient (S): 25 mm   Hg. Valve area (VTI): 1.04 cm^2. Valve area (Vmax): 1 cm^2. Valve   area (Vmean): 1.22 cm^2. - Mitral valve: There was trivial  regurgitation. - Left atrium: The atrium was mildly dilated. - Right ventricle: The cavity size was normal. Wall thickness was   normal. Systolic function was normal. - Atrial septum: No defect or patent foramen ovale was identified   by color flow Doppler. - Tricuspid valve: There was mild regurgitation. - Inferior vena cava: The vessel was normal in size. The   respirophasic diameter changes were in the normal range (= 50%),   consistent with normal central venous pressure.  Cardiac Catheterization: Conclusion     Prox RCA to Mid RCA lesion, 20 %stenosed.  Mid RCA lesion, 80 %stenosed.  Ost 1st Mrg to 1st Mrg lesion, 80 %stenosed.  Mid Cx to Dist Cx lesion, 70 %stenosed.  Prox LAD  to Dist LAD lesion, 40 %stenosed.  The left ventricular systolic function is normal.  LV end diastolic pressure is normal.  The left ventricular ejection fraction is 55-65% by visual estimate.  LV end diastolic pressure is normal.    There is three-vessel coronary calcification.  80% eccentric mid RCA stenosis.  80% ostial obtuse marginal #1 stenosis.  Moderate diffuse calcified LAD disease less than 50%.  Normal left ventricular systolic function and hemodynamics  Normal pulmonary pressures and pulmonary capillary wedge.  Moderate to severe aortic stenosis with aortic valve area 1.04.   RECOMMENDATIONS:   Anti-ischemic therapy to potentially include beta blocker therapy, antiplatelet therapy, and statin therapy.  Intervention on both the right coronary and obtuse marginal stenosis would be higher than usual risk due to heavy calcification and vessel tortuosity. Initial inclination is medical therapy in the absence of clear anginal symptoms.  Fatigue even at rest is of uncertain etiology. Exclude other possibilities including depression, although on one systemic illnesses.    CTA Cardiac: FINDINGS: Aortic Valve: Trileaflet, severely thickened and calcified with severely  restricted leaflet opening. There is minimal calcification extending into the LVOT.  Aorta:  Normal size, moderate diffuse calcifications, no dissection.  Sinotubular Junction:  31 x 31 mm  Ascending Thoracic Aorta:  35 x 34 mm  Aortic Arch:  25 x 25 mm  Descending Thoracic Aorta:  23 x 23 mm  Sinus of Valsalva Measurements:  Non-coronary:  35 mm  Right -coronary:  36 mm  Left -coronary:  36 mm  Coronary Artery Height above Annulus:  Left Main:  11 mm  Right Coronary:  12 mm  Virtual Basal Annulus Measurements:  Maximum/Minimum Diameter:  27 x 26 mm  Perimeter:  82 mm  Area:  504 mm2  Optimum Fluoroscopic Angle for Delivery:  LAO 8 CAU 7  Other findings:  A large left atrial appendage without evidence of a thrombus.  Normal pulmonary vein drainage into the left atrium.  Dilated pulmonary artery measuring 34 x 25 mm.  IMPRESSION: 1. Trileaflet, severely thickened and calcified aortic valve with severely restricted leaflet opening and annular measurements suitable for delivery of a 26 mm Edwards-SAPIEN 3 valve.  2. Sufficient annulus to coronary distance.  3. Optimum Fluoroscopic Angle for Delivery:  LAO 8 CAU 7.  4. A large left atrial appendage without evidence of a thrombus  5. Dilated pulmonary artery measuring 34 x 25 mm  CTA Chest/Abd/Pelvis: VASCULAR MEASUREMENTS PERTINENT TO TAVR:  AORTA:  Minimal Aortic Diameter -  13 x 14 mm  Severity of Aortic Calcification -  moderate  RIGHT PELVIS:  Right Common Iliac Artery -  Minimal Diameter - 7.7 x 6.4 mm  Tortuosity - mild  Calcification - moderate  Right External Iliac Artery -  Minimal Diameter - 7.2 x 7.1 mm  Tortuosity - severe  Calcification - none  Right Common Femoral Artery -  Minimal Diameter - 6.9 x 8.0 mm  Tortuosity - mild  Calcification - none  LEFT PELVIS:  Left Common Iliac Artery -  Minimal Diameter - 6.1 x 7.3  mm  Tortuosity - moderate  Calcification - mild  Left External Iliac Artery -  Minimal Diameter - 6.8 x 6.9 mm  Tortuosity - severe  Calcification - none  Left Common Femoral Artery -  Minimal Diameter - 6.5 x 7.9 mm  Tortuosity - mild  Calcification - mild  Review of the MIP images confirms the above findings.  IMPRESSION: 1. Vascular findings and measurements pertinent  to potential TAVR procedure, as above. This patient appears to have suitable pelvic arterial access bilaterally from a size perspective, although there is extensive tortuosity bilaterally, particularly in the external iliac arteries, as detailed above. 2. Severe thickening calcification of the aortic valve, compatible with the clinical history of severe aortic stenosis. 3. Aortic atherosclerosis, in addition to left main and 3 vessel coronary artery disease. 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 4. Mild cardiomegaly. 5. Multiple small pulmonary nodules scattered throughout the lungs bilaterally, measuring 7 mm or less in mean diameter. Many of these are subpleural in location, and are therefore favored to represent benign subpleural lymph nodes, however, these are all considered nonspecific. Non-contrast chest CT at 3-6 months is recommended. If the nodules are stable at time of repeat CT, then future CT at 18-24 months (from today's scan) is considered optional for low-risk patients, but is recommended for high-risk patients. This recommendation follows the consensus statement: Guidelines for Management of Incidental Pulmonary Nodules Detected on CT Images: From the Fleischner Society 2017; Radiology 2017; 284:228-243. 6. Additional incidental findings, as above  ASSESSMENT AND PLAN: 82 yo male with Stage D Severe Aortic Stenosis and Multivessel  CAD, Chronic diastolic CHF NYHA II-III symptoms of exertional dyspnea and fatigue.  Reviewed treatment options with the patient and his wife. Have discussed his case with Dr Roxy Manns and Dr Tamala Julian. With multivessel CAD affecting the RCA and LCx distribution, favor PCI of at least the RCA and possibly the first OM branch of the circumflex as both vessels supply a relatively large area of myocardium and both vessels have severe stenoses. With heavy calcification, I suspect rotational atherectomy will be necessary. Plan a femoral approach with rotational atherectomy and stenting of the RCA and possibly the first OM. I have reviewed the risks, indications, and alternatives to cardiac catheterization, angioplasty, rotational atherectomy, and stenting with the patient. Risks include but are not limited to bleeding, infection, vascular injury, stroke, myocardial infection, arrhythmia, kidney injury, radiation-related injury in the case of prolonged fluoroscopy use, emergency cardiac surgery, and death. The patient understands the risks of serious complication is 1-2 in 3570 with diagnostic cardiac cath and 1-2% or less with angioplasty/stenting. They understand risks are increased in the context of severe aortic stenosis.   Following PCI, will plan to move forward with TAVR once he recovers. CTA studies are reviewed and he appears to have transfemoral access for a 26 mm Sapien 3 valve. Ongoing evaluation by the multidisciplinary heart team will continue once his PCI is completed.   Current medicines are reviewed with the patient today.  The patient does not have concerns regarding medicines.  Labs/ tests ordered today include:  No orders of the defined types were placed in this encounter.   Deatra James, MD  04/20/2016 10:39 PM    [address], [place], [place]  [address] Phone: [phone]; Fax: [phone]

## 2016-04-18 NOTE — Patient Instructions (Signed)
Medication Instructions:  Your physician recommends that you continue on your current medications as directed. Please refer to the Current Medication list given to you today.  Labwork: No new orders.   Testing/Procedures: Your physician has requested that you have a cardiac catheterization. Cardiac catheterization is used to diagnose and/or treat various heart conditions. Doctors may recommend this procedure for a number of different reasons. The most common reason is to evaluate chest pain. Chest pain can be a symptom of coronary artery disease (CAD), and cardiac catheterization can show whether plaque is narrowing or blocking your heart's arteries. This procedure is also used to evaluate the valves, as well as measure the blood flow and oxygen levels in different parts of your heart. For further information please visit [email or web address]. Please follow instruction sheet, as given.  Follow-Up: We will arrange further follow-up after your coronary intervention.   Any Other Special Instructions Will Be Listed Below (If Applicable).     If you need a refill on your cardiac medications before your next appointment, please call your pharmacy.

## 2016-04-25 ENCOUNTER — Other Ambulatory Visit: Payer: Self-pay | Admitting: Cardiovascular Disease

## 2016-04-25 DIAGNOSIS — I25118 Atherosclerotic heart disease of native coronary artery with other forms of angina pectoris: Secondary | ICD-10-CM

## 2016-04-30 ENCOUNTER — Encounter (HOSPITAL_COMMUNITY): Payer: Self-pay | Admitting: Cardiovascular Disease

## 2016-04-30 ENCOUNTER — Encounter (HOSPITAL_COMMUNITY)
Admission: RE | Disposition: A | Discharge status: Home / Self Care | Payer: Self-pay | Source: Ambulatory Visit | Attending: Cardiovascular Disease

## 2016-04-30 ENCOUNTER — Ambulatory Visit (HOSPITAL_COMMUNITY)
Admission: RE | Admit: 2016-04-30 | Discharge: 2016-05-01 | Disposition: A | Discharge status: Home / Self Care | Payer: Medicare Other | Source: Ambulatory Visit | Attending: Cardiovascular Disease | Admitting: Cardiovascular Disease

## 2016-04-30 DIAGNOSIS — Z79899 Other long term (current) drug therapy: Secondary | ICD-10-CM | POA: Insufficient documentation

## 2016-04-30 DIAGNOSIS — I5032 Chronic diastolic (congestive) heart failure: Secondary | ICD-10-CM | POA: Diagnosis not present

## 2016-04-30 DIAGNOSIS — I2584 Coronary atherosclerosis due to calcified coronary lesion: Secondary | ICD-10-CM | POA: Diagnosis not present

## 2016-04-30 DIAGNOSIS — Z955 Presence of coronary angioplasty implant and graft: Secondary | ICD-10-CM

## 2016-04-30 DIAGNOSIS — I2510 Atherosclerotic heart disease of native coronary artery without angina pectoris: Secondary | ICD-10-CM | POA: Diagnosis present

## 2016-04-30 DIAGNOSIS — G4733 Obstructive sleep apnea (adult) (pediatric): Secondary | ICD-10-CM | POA: Insufficient documentation

## 2016-04-30 DIAGNOSIS — Z8546 Personal history of malignant neoplasm of prostate: Secondary | ICD-10-CM | POA: Diagnosis not present

## 2016-04-30 DIAGNOSIS — H3530 Unspecified macular degeneration: Secondary | ICD-10-CM | POA: Diagnosis not present

## 2016-04-30 DIAGNOSIS — M1990 Unspecified osteoarthritis, unspecified site: Secondary | ICD-10-CM | POA: Diagnosis not present

## 2016-04-30 DIAGNOSIS — I110 Hypertensive heart disease with heart failure: Secondary | ICD-10-CM | POA: Diagnosis not present

## 2016-04-30 DIAGNOSIS — Z8551 Personal history of malignant neoplasm of bladder: Secondary | ICD-10-CM | POA: Diagnosis not present

## 2016-04-30 DIAGNOSIS — Z7982 Long term (current) use of aspirin: Secondary | ICD-10-CM | POA: Insufficient documentation

## 2016-04-30 DIAGNOSIS — E785 Hyperlipidemia, unspecified: Secondary | ICD-10-CM | POA: Insufficient documentation

## 2016-04-30 DIAGNOSIS — I25118 Atherosclerotic heart disease of native coronary artery with other forms of angina pectoris: Secondary | ICD-10-CM | POA: Diagnosis not present

## 2016-04-30 DIAGNOSIS — Z8249 Family history of ischemic heart disease and other diseases of the circulatory system: Secondary | ICD-10-CM | POA: Diagnosis not present

## 2016-04-30 DIAGNOSIS — I350 Nonrheumatic aortic (valve) stenosis: Secondary | ICD-10-CM | POA: Diagnosis not present

## 2016-04-30 DIAGNOSIS — Z950 Presence of cardiac pacemaker: Secondary | ICD-10-CM | POA: Diagnosis not present

## 2016-04-30 DIAGNOSIS — I4891 Unspecified atrial fibrillation: Secondary | ICD-10-CM | POA: Diagnosis not present

## 2016-04-30 DIAGNOSIS — Z7901 Long term (current) use of anticoagulants: Secondary | ICD-10-CM | POA: Diagnosis not present

## 2016-04-30 HISTORY — DX: Presence of cardiac pacemaker: Z95.0

## 2016-04-30 HISTORY — DX: Gastro-esophageal reflux disease without esophagitis: K21.9

## 2016-04-30 HISTORY — DX: Unspecified osteoarthritis, unspecified site: M19.90

## 2016-04-30 HISTORY — PX: CARDIAC CATHETERIZATION: SHX172

## 2016-04-30 HISTORY — DX: Unspecified asthma, uncomplicated: J45.909

## 2016-04-30 LAB — CBC
HEMATOCRIT: 38.6 % — AB (ref 39.0–52.0)
Hemoglobin: 12.8 g/dL — ABNORMAL LOW (ref 13.0–17.0)
MCH: 30.8 pg (ref 26.0–34.0)
MCHC: 33.2 g/dL (ref 30.0–36.0)
MCV: 93 fL (ref 78.0–100.0)
PLATELETS: 199 10*3/uL (ref 150–400)
RBC: 4.15 MIL/uL — AB (ref 4.22–5.81)
RDW: 14.3 % (ref 11.5–15.5)
WBC: 4.2 10*3/uL (ref 4.0–10.5)

## 2016-04-30 LAB — BASIC METABOLIC PANEL
ANION GAP: 8 (ref 5–15)
BUN: 17 mg/dL (ref 6–20)
CO2: 26 mmol/L (ref 22–32)
Calcium: 9.3 mg/dL (ref 8.9–10.3)
Chloride: 106 mmol/L (ref 101–111)
Creatinine, Ser: 1.13 mg/dL (ref 0.61–1.24)
GFR calc Af Amer: >60 mL/min (ref >60)
GFR, EST NON AFRICAN AMERICAN: 58 mL/min — AB (ref >60)
GLUCOSE: 103 mg/dL — AB (ref 65–99)
POTASSIUM: 4.4 mmol/L (ref 3.5–5.1)
Sodium: 140 mmol/L (ref 135–145)

## 2016-04-30 LAB — PROTIME-INR
INR: 1.04
Prothrombin Time: 13.6 seconds (ref 11.4–15.2)

## 2016-04-30 LAB — POCT ACTIVATED CLOTTING TIME: Activated Clotting Time: 357 seconds

## 2016-04-30 SURGERY — CORONARY ATHERECTOMY
Anesthesia: LOCAL

## 2016-04-30 MED ORDER — SODIUM CHLORIDE 0.9 % WEIGHT BASED INFUSION: 1.0000 mL/kg/h | INTRAVENOUS | Status: AC

## 2016-04-30 MED ORDER — IOPAMIDOL (ISOVUE-370) INJECTION 76%
INTRAVENOUS | Status: AC
  Filled 2016-04-30: qty 125

## 2016-04-30 MED ORDER — ATORVASTATIN CALCIUM 40 MG PO TABS: 40.0000 mg | ORAL_TABLET | Freq: Every day | ORAL | Status: DC

## 2016-04-30 MED ORDER — HEPARIN (PORCINE) IN NACL 2-0.9 UNIT/ML-% IJ SOLN
INTRAMUSCULAR | Status: DC | PRN
  Administered 2016-04-30: 1000 mL

## 2016-04-30 MED ORDER — AMIODARONE HCL 200 MG PO TABS
200.0000 mg | ORAL_TABLET | Freq: Every day | ORAL | Status: DC
  Administered 2016-05-01: 11:00:00 200 mg via ORAL
  Filled 2016-04-30: qty 1

## 2016-04-30 MED ORDER — IOPAMIDOL (ISOVUE-370) INJECTION 76%
INTRAVENOUS | Status: DC | PRN
  Administered 2016-04-30: 110 mL via INTRA_ARTERIAL

## 2016-04-30 MED ORDER — SODIUM CHLORIDE 0.9 % WEIGHT BASED INFUSION
3.0000 mL/kg/h | INTRAVENOUS | Status: DC
Start: 2016-04-30 — End: 2016-04-30
  Administered 2016-04-30: 3 mL/kg/h via INTRAVENOUS

## 2016-04-30 MED ORDER — HEPARIN (PORCINE) IN NACL 2-0.9 UNIT/ML-% IJ SOLN
INTRAMUSCULAR | Status: AC
  Filled 2016-04-30: qty 500

## 2016-04-30 MED ORDER — ASPIRIN 81 MG PO CHEW: 81.0000 mg | CHEWABLE_TABLET | ORAL | Status: DC

## 2016-04-30 MED ORDER — VERAPAMIL HCL 2.5 MG/ML IV SOLN
INTRAVENOUS | Status: AC
  Filled 2016-04-30: qty 2

## 2016-04-30 MED ORDER — ONDANSETRON HCL 4 MG/2ML IJ SOLN: 4.0000 mg | Freq: Four times a day (QID) | INTRAMUSCULAR | Status: DC | PRN

## 2016-04-30 MED ORDER — BIVALIRUDIN BOLUS VIA INFUSION - CUPID
INTRAVENOUS | Status: DC | PRN
  Administered 2016-04-30: 44.55 mg via INTRAVENOUS

## 2016-04-30 MED ORDER — BIVALIRUDIN 250 MG IV SOLR
INTRAVENOUS | Status: AC
Start: 2016-04-30 — End: 2016-04-30
  Filled 2016-04-30: qty 250

## 2016-04-30 MED ORDER — LIDOCAINE HCL (PF) 1 % IJ SOLN
INTRAMUSCULAR | Status: AC
  Filled 2016-04-30: qty 30

## 2016-04-30 MED ORDER — ASPIRIN 81 MG PO CHEW
81.0000 mg | CHEWABLE_TABLET | Freq: Every day | ORAL | Status: DC
  Administered 2016-05-01: 81 mg via ORAL
  Filled 2016-04-30: qty 1

## 2016-04-30 MED ORDER — SODIUM CHLORIDE 0.9 % WEIGHT BASED INFUSION
1.0000 mL/kg/h | INTRAVENOUS | Status: DC
  Administered 2016-04-30: 250 mL via INTRAVENOUS

## 2016-04-30 MED ORDER — MIDAZOLAM HCL 2 MG/2ML IJ SOLN
INTRAMUSCULAR | Status: DC | PRN
  Administered 2016-04-30 (×3): 1 mg via INTRAVENOUS

## 2016-04-30 MED ORDER — HYDRALAZINE HCL 20 MG/ML IJ SOLN: 5.0000 mg | INTRAMUSCULAR | Status: AC | PRN

## 2016-04-30 MED ORDER — NITROGLYCERIN IN D5W 200-5 MCG/ML-% IV SOLN
INTRAVENOUS | Status: AC
  Filled 2016-04-30: qty 250

## 2016-04-30 MED ORDER — LABETALOL HCL 5 MG/ML IV SOLN: 10.0000 mg | INTRAVENOUS | Status: AC | PRN

## 2016-04-30 MED ORDER — SODIUM CHLORIDE 0.9% FLUSH: 3.0000 mL | Freq: Two times a day (BID) | INTRAVENOUS | Status: DC

## 2016-04-30 MED ORDER — ASPIRIN 81 MG PO TABS: 81.0000 mg | ORAL_TABLET | Freq: Every day | ORAL | Status: DC

## 2016-04-30 MED ORDER — HEPARIN SODIUM (PORCINE) 1000 UNIT/ML IJ SOLN
INTRAMUSCULAR | Status: AC
  Filled 2016-04-30: qty 1

## 2016-04-30 MED ORDER — SODIUM CHLORIDE 0.9 % IV SOLN: 250.0000 mL | INTRAVENOUS | Status: DC | PRN

## 2016-04-30 MED ORDER — SODIUM CHLORIDE 0.9% FLUSH: 3.0000 mL | INTRAVENOUS | Status: DC | PRN

## 2016-04-30 MED ORDER — MIDAZOLAM HCL 2 MG/2ML IJ SOLN
INTRAMUSCULAR | Status: AC
  Filled 2016-04-30: qty 2

## 2016-04-30 MED ORDER — ACETAMINOPHEN 325 MG PO TABS: 650.0000 mg | ORAL_TABLET | ORAL | Status: DC | PRN

## 2016-04-30 MED ORDER — APIXABAN 2.5 MG PO TABS
2.5000 mg | ORAL_TABLET | Freq: Two times a day (BID) | ORAL | Status: DC
  Administered 2016-05-01: 2.5 mg via ORAL
  Filled 2016-04-30: qty 1

## 2016-04-30 MED ORDER — LIDOCAINE HCL (PF) 1 % IJ SOLN
INTRAMUSCULAR | Status: DC | PRN
  Administered 2016-04-30: 30 mL

## 2016-04-30 MED ORDER — FENTANYL CITRATE (PF) 100 MCG/2ML IJ SOLN
INTRAMUSCULAR | Status: AC
  Filled 2016-04-30: qty 2

## 2016-04-30 MED ORDER — LATANOPROST 0.005 % OP SOLN
1.0000 [drp] | Freq: Every day | OPHTHALMIC | Status: DC
  Filled 2016-04-30: qty 2.5

## 2016-04-30 MED ORDER — CLOPIDOGREL BISULFATE 75 MG PO TABS
75.0000 mg | ORAL_TABLET | Freq: Every day | ORAL | Status: DC
  Administered 2016-05-01: 75 mg via ORAL
  Filled 2016-04-30: qty 1

## 2016-04-30 MED ORDER — FENTANYL CITRATE (PF) 100 MCG/2ML IJ SOLN
INTRAMUSCULAR | Status: DC | PRN
  Administered 2016-04-30 (×3): 25 ug via INTRAVENOUS

## 2016-04-30 MED ORDER — LISINOPRIL 10 MG PO TABS
20.0000 mg | ORAL_TABLET | Freq: Every day | ORAL | Status: DC
  Administered 2016-05-01: 20 mg via ORAL
  Filled 2016-04-30: qty 2

## 2016-04-30 MED ORDER — SODIUM CHLORIDE 0.9 % IV SOLN
INTRAVENOUS | Status: DC | PRN
  Administered 2016-04-30: 1.75 mg/kg/h via INTRAVENOUS
  Administered 2016-04-30: 12:00:00

## 2016-04-30 SURGICAL SUPPLY — 18 items
BALLN TREK OTW 2.5X12 (BALLOONS) ×2
BALLN ~~LOC~~ TREK RX 3.25X8 (BALLOONS) ×2 IMPLANT
BALLOON TREK OTW 2.5X12 (BALLOONS) ×1 IMPLANT
CATH ROTALINK PLUS 1.50MM (BURR) ×2 IMPLANT
CATH VISTA GUIDE 7FR AL 1 (CATHETERS) ×2 IMPLANT
DEVICE CLOSURE PERCLS PRGLD 6F (VASCULAR PRODUCTS) ×1 IMPLANT
ELECT DEFIB PAD ADLT CADENCE (PAD) ×2 IMPLANT
KIT ENCORE 26 ADVANTAGE (KITS) ×4 IMPLANT
KIT HEART LEFT (KITS) ×2 IMPLANT
LUBRICANT ROTAGLIDE 20CC VIAL (MISCELLANEOUS) ×2 IMPLANT
PACK CARDIAC CATHETERIZATION (CUSTOM PROCEDURE TRAY) ×2 IMPLANT
PERCLOSE PROGLIDE 6F (VASCULAR PRODUCTS) ×2
SHEATH PINNACLE 7F 10CM (SHEATH) ×2 IMPLANT
STENT SYNERGY DES 3X12 (Permanent Stent) ×2 IMPLANT
TRANSDUCER W/STOPCOCK (MISCELLANEOUS) ×2 IMPLANT
TUBING CIL FLEX 10 FLL-RA (TUBING) ×2 IMPLANT
WIRE COUGAR XT STRL 300CM (WIRE) ×2 IMPLANT
WIRE ROTA FLOPPY .009X325CM (WIRE) ×4 IMPLANT

## 2016-04-30 NOTE — Interval H&P Note (Signed)
Cath Lab Visit (complete for each Cath Lab visit)  Clinical Evaluation Leading to the Procedure:   ACS: No.  Non-ACS:    Anginal Classification: CCS III  Anti-ischemic medical therapy: No Therapy  Non-Invasive Test Results: No non-invasive testing performed  Prior CABG: No previous CABG      History and Physical Interval Note:  04/30/2016 11:19 AM  [name]  has presented today for surgery, with the diagnosis of cad  The various methods of treatment have been discussed with the patient and family. After consideration of risks, benefits and other options for treatment, the patient has consented to  Procedure(s): Coronary/Graft Atherectomy (N/A) as a surgical intervention .  The patient's history has been reviewed, patient examined, no change in status, stable for surgery.  I have reviewed the patient's chart and labs.  Questions were answered to the patient's satisfaction.     Sherren Mocha

## 2016-04-30 NOTE — H&P (View-Only) (Signed)
Cardiology Office Note Date:  04/20/2016   ID:  Ryan [name], DOB Aug 31, 1929, MRN [id number]  PCP:  Velna Hatchet, MD  Cardiologist:  Sherren Mocha, MD    Chief Complaint  Patient presents with  . Follow-up     History of Present Illness: Ryan [name] is a 82 y.o. male who presents for follow-up of aortic stenosis and CAD. The patient was recently hospitalized with atrial fibrillation with RVR, then tachy-brady syndrome requiring PPM placement 03-21-2016. He had complained of progressive fatigue and exercise intolerance over a period of 3-4 months. He is here with his wife today to discuss ongoing treatment options.   His pacemaker implantation was complicated by a pocket hematoma but he is now back on Eliquis for anticoagulation. He continues to complain of exertional dyspnea and fatigue with low-level activity. No chest pain, chest pressure, lightheadedness, or syncope. Has not appreciated any symptomatic improvement with pacemaker implantation and eager to move forward with treatment of aortic stenosis.  He has undergone cardiac catheterization, CTA studies, and surgical evaluation with Dr Roxy Manns.   Past Medical History:  Diagnosis Date  . Age-related macular degeneration   . Allergic rhinitis   . Aortic valve stenosis   . Carotid artery obstruction   . Chronic lower back pain   . Coronary arteriosclerosis in native artery   . Diverticulitis of colon   . DJD (degenerative joint disease)   . Dysthymia   . History of elevated PSA 05/2009  . HLD (hyperlipidemia)   . Hypertension   . Nocturnal hypoxemia   . OSA (obstructive sleep apnea)    intolerant to CPAP  . Paroxysmal supraventricular tachycardia ([place])   . Primary malignant neoplasm of bladder ([place])   . Prostate cancer [facility])     Past Surgical History:  Procedure Laterality Date  . ABDOMINAL SURGERY  1981   Meckles Diverticulum with volvulus  . APPENDECTOMY    . CARDIAC CATHETERIZATION N/A 01/17/2016   Procedure: Right/Left Heart Cath and Coronary Angiography;  Surgeon: Belva Crome, MD;  Location: [place] [facility];  Service: Cardiovascular;  Laterality: N/A;  . CAROTID ENDARTERECTOMY     bilateral  . EP IMPLANTABLE DEVICE N/A 03/21/2016   Procedure: Pacemaker Implant;  Surgeon: Evans Lance, MD;  Location: [place] [facility];  Service: Cardiovascular;  Laterality: N/A;  . HERNIA REPAIR Right 2009    Current Outpatient Prescriptions  Medication Sig Dispense Refill  . amiodarone (PACERONE) 200 MG tablet Take 1 tablet (200 mg total) by mouth daily. 90 tablet 3  . apixaban (ELIQUIS) 2.5 MG TABS tablet Take 1 tablet (2.5 mg total) by mouth 2 (two) times daily. 60 tablet 10  . aspirin 81 MG tablet Take 81 mg by mouth daily.    Marland Kitchen atorvastatin (LIPITOR) 40 MG tablet Take 1 tablet (40 mg total) by mouth daily. 30 tablet 5  . cholecalciferol (VITAMIN D) 1000 units tablet Take 1,000 Units by mouth 2 (two) times a week.     . Coenzyme Q10 (COQ-10 PO) Take 1 capsule by mouth 2 (two) times a week.     . latanoprost (XALATAN) 0.005 % ophthalmic solution Place 1 drop into both eyes at bedtime.    Marland Kitchen lisinopril (PRINIVIL,ZESTRIL) 20 MG tablet Take 20 mg by mouth daily.    . clopidogrel (PLAVIX) 75 MG tablet Take 1 tablet (75 mg total) by mouth daily. 90 tablet 3   No current facility-administered medications for this visit.     Allergies:  Hydrochlorothiazide; Ace inhibitors; Ciprofloxacin; Losartan; and Sulfa antibiotics   Social History:  The patient  reports that he has never smoked. He has never used smokeless tobacco. He reports that he does not drink alcohol or use drugs.   Family History:  The patient's  family history includes Heart disease in his father and mother.    ROS:  Please see the history of present illness. All other systems are reviewed and negative.    PHYSICAL EXAM: VS:  BP (!) 154/64   Pulse 68   Ht 5\' 7"  (1.702 m)   Wt 131 lb 6.4 oz (59.6 kg)   BMI 20.58 kg/m  ,  BMI Body mass index is 20.58 kg/m. GEN: Thin, elderly male, in no acute distress  HEENT: normal  Neck: no JVD, no masses. bilateral carotid bruits Cardiac: RRR with 3/6 harsh late-peaking systolic murmur at the RUSB                Respiratory:  clear to auscultation bilaterally, normal work of breathing GI: soft, nontender, nondistended, + BS MS: no deformity or atrophy  Ext: no pretibial edema, pedal pulses 2+= bilaterally Skin: warm and dry, no rash Neuro:  Strength and sensation are intact Psych: euthymic mood, full affect  EKG:  EKG is not ordered today.  Recent Labs: 03/18/2016: ALT 19; B Natriuretic Peptide 38.4; TSH 2.194 03/22/2016: BUN 17; Creatinine, Ser 1.09; Hemoglobin 15.8; Platelets 197; Potassium 4.0; Sodium 137   Lipid Panel     Component Value Date/Time   CHOL 147 03/20/2016 0422   TRIG 54 03/20/2016 0422   HDL 61 03/20/2016 0422   CHOLHDL 2.4 03/20/2016 0422   VLDL 11 03/20/2016 0422   LDLCALC 75 03/20/2016 0422      Wt Readings from Last 3 Encounters:  04/18/16 131 lb 6.4 oz (59.6 kg)  03/31/16 133 lb 9.6 oz (60.6 kg)  03/22/16 127 lb 12.8 oz (58 kg)     Cardiac Studies Reviewed: Echo 10-10-2015: Study Conclusions  - Left ventricle: The cavity size was normal. Wall thickness was   increased in a pattern of mild LVH. Systolic function was normal.   The estimated ejection fraction was in the range of 60% to 65%.   Wall motion was normal; there were no regional wall motion   abnormalities. Doppler parameters are consistent with abnormal   left ventricular relaxation (grade 1 diastolic dysfunction).   Doppler parameters are consistent with indeterminate ventricular   filling pressure. - Aortic valve: Valve mobility was restricted. There was moderate   stenosis. Peak velocity (S): 387.9 cm/s. Mean gradient (S): 25 mm   Hg. Valve area (VTI): 1.04 cm^2. Valve area (Vmax): 1 cm^2. Valve   area (Vmean): 1.22 cm^2. - Mitral valve: There was trivial  regurgitation. - Left atrium: The atrium was mildly dilated. - Right ventricle: The cavity size was normal. Wall thickness was   normal. Systolic function was normal. - Atrial septum: No defect or patent foramen ovale was identified   by color flow Doppler. - Tricuspid valve: There was mild regurgitation. - Inferior vena cava: The vessel was normal in size. The   respirophasic diameter changes were in the normal range (= 50%),   consistent with normal central venous pressure.  Cardiac Catheterization: Conclusion     Prox RCA to Mid RCA lesion, 20 %stenosed.  Mid RCA lesion, 80 %stenosed.  Ost 1st Mrg to 1st Mrg lesion, 80 %stenosed.  Mid Cx to Dist Cx lesion, 70 %stenosed.  Prox LAD  to Dist LAD lesion, 40 %stenosed.  The left ventricular systolic function is normal.  LV end diastolic pressure is normal.  The left ventricular ejection fraction is 55-65% by visual estimate.  LV end diastolic pressure is normal.    There is three-vessel coronary calcification.  80% eccentric mid RCA stenosis.  80% ostial obtuse marginal #1 stenosis.  Moderate diffuse calcified LAD disease less than 50%.  Normal left ventricular systolic function and hemodynamics  Normal pulmonary pressures and pulmonary capillary wedge.  Moderate to severe aortic stenosis with aortic valve area 1.04.   RECOMMENDATIONS:   Anti-ischemic therapy to potentially include beta blocker therapy, antiplatelet therapy, and statin therapy.  Intervention on both the right coronary and obtuse marginal stenosis would be higher than usual risk due to heavy calcification and vessel tortuosity. Initial inclination is medical therapy in the absence of clear anginal symptoms.  Fatigue even at rest is of uncertain etiology. Exclude other possibilities including depression, although on one systemic illnesses.    CTA Cardiac: FINDINGS: Aortic Valve: Trileaflet, severely thickened and calcified with severely  restricted leaflet opening. There is minimal calcification extending into the LVOT.  Aorta:  Normal size, moderate diffuse calcifications, no dissection.  Sinotubular Junction:  31 x 31 mm  Ascending Thoracic Aorta:  35 x 34 mm  Aortic Arch:  25 x 25 mm  Descending Thoracic Aorta:  23 x 23 mm  Sinus of Valsalva Measurements:  Non-coronary:  35 mm  Right -coronary:  36 mm  Left -coronary:  36 mm  Coronary Artery Height above Annulus:  Left Main:  11 mm  Right Coronary:  12 mm  Virtual Basal Annulus Measurements:  Maximum/Minimum Diameter:  27 x 26 mm  Perimeter:  82 mm  Area:  504 mm2  Optimum Fluoroscopic Angle for Delivery:  LAO 8 CAU 7  Other findings:  A large left atrial appendage without evidence of a thrombus.  Normal pulmonary vein drainage into the left atrium.  Dilated pulmonary artery measuring 34 x 25 mm.  IMPRESSION: 1. Trileaflet, severely thickened and calcified aortic valve with severely restricted leaflet opening and annular measurements suitable for delivery of a 26 mm Edwards-SAPIEN 3 valve.  2. Sufficient annulus to coronary distance.  3. Optimum Fluoroscopic Angle for Delivery:  LAO 8 CAU 7.  4. A large left atrial appendage without evidence of a thrombus  5. Dilated pulmonary artery measuring 34 x 25 mm  CTA Chest/Abd/Pelvis: VASCULAR MEASUREMENTS PERTINENT TO TAVR:  AORTA:  Minimal Aortic Diameter -  13 x 14 mm  Severity of Aortic Calcification -  moderate  RIGHT PELVIS:  Right Common Iliac Artery -  Minimal Diameter - 7.7 x 6.4 mm  Tortuosity - mild  Calcification - moderate  Right External Iliac Artery -  Minimal Diameter - 7.2 x 7.1 mm  Tortuosity - severe  Calcification - none  Right Common Femoral Artery -  Minimal Diameter - 6.9 x 8.0 mm  Tortuosity - mild  Calcification - none  LEFT PELVIS:  Left Common Iliac Artery -  Minimal Diameter - 6.1 x 7.3  mm  Tortuosity - moderate  Calcification - mild  Left External Iliac Artery -  Minimal Diameter - 6.8 x 6.9 mm  Tortuosity - severe  Calcification - none  Left Common Femoral Artery -  Minimal Diameter - 6.5 x 7.9 mm  Tortuosity - mild  Calcification - mild  Review of the MIP images confirms the above findings.  IMPRESSION: 1. Vascular findings and measurements pertinent  to potential TAVR procedure, as above. This patient appears to have suitable pelvic arterial access bilaterally from a size perspective, although there is extensive tortuosity bilaterally, particularly in the external iliac arteries, as detailed above. 2. Severe thickening calcification of the aortic valve, compatible with the clinical history of severe aortic stenosis. 3. Aortic atherosclerosis, in addition to left main and 3 vessel coronary artery disease. 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 4. Mild cardiomegaly. 5. Multiple small pulmonary nodules scattered throughout the lungs bilaterally, measuring 7 mm or less in mean diameter. Many of these are subpleural in location, and are therefore favored to represent benign subpleural lymph nodes, however, these are all considered nonspecific. Non-contrast chest CT at 3-6 months is recommended. If the nodules are stable at time of repeat CT, then future CT at 18-24 months (from today's scan) is considered optional for low-risk patients, but is recommended for high-risk patients. This recommendation follows the consensus statement: Guidelines for Management of Incidental Pulmonary Nodules Detected on CT Images: From the Fleischner Society 2017; Radiology 2017; 284:228-243. 6. Additional incidental findings, as above  ASSESSMENT AND PLAN: 82 yo male with Stage D Severe Aortic Stenosis and Multivessel  CAD, Chronic diastolic CHF NYHA II-III symptoms of exertional dyspnea and fatigue.  Reviewed treatment options with the patient and his wife. Have discussed his case with Dr Roxy Manns and Dr Tamala Julian. With multivessel CAD affecting the RCA and LCx distribution, favor PCI of at least the RCA and possibly the first OM branch of the circumflex as both vessels supply a relatively large area of myocardium and both vessels have severe stenoses. With heavy calcification, I suspect rotational atherectomy will be necessary. Plan a femoral approach with rotational atherectomy and stenting of the RCA and possibly the first OM. I have reviewed the risks, indications, and alternatives to cardiac catheterization, angioplasty, rotational atherectomy, and stenting with the patient. Risks include but are not limited to bleeding, infection, vascular injury, stroke, myocardial infection, arrhythmia, kidney injury, radiation-related injury in the case of prolonged fluoroscopy use, emergency cardiac surgery, and death. The patient understands the risks of serious complication is 1-2 in 4656 with diagnostic cardiac cath and 1-2% or less with angioplasty/stenting. They understand risks are increased in the context of severe aortic stenosis.   Following PCI, will plan to move forward with TAVR once he recovers. CTA studies are reviewed and he appears to have transfemoral access for a 26 mm Sapien 3 valve. Ongoing evaluation by the multidisciplinary heart team will continue once his PCI is completed.   Current medicines are reviewed with the patient today.  The patient does not have concerns regarding medicines.  Labs/ tests ordered today include:  No orders of the defined types were placed in this encounter.   Deatra James, MD  04/20/2016 10:39 PM    [address], [place], [place]  [address] Phone: [phone]; Fax: [phone]

## 2016-05-01 ENCOUNTER — Telehealth: Payer: Self-pay | Admitting: Cardiovascular Disease

## 2016-05-01 DIAGNOSIS — I2584 Coronary atherosclerosis due to calcified coronary lesion: Secondary | ICD-10-CM | POA: Diagnosis not present

## 2016-05-01 DIAGNOSIS — I25110 Atherosclerotic heart disease of native coronary artery with unstable angina pectoris: Secondary | ICD-10-CM | POA: Diagnosis not present

## 2016-05-01 DIAGNOSIS — I350 Nonrheumatic aortic (valve) stenosis: Secondary | ICD-10-CM

## 2016-05-01 DIAGNOSIS — I25118 Atherosclerotic heart disease of native coronary artery with other forms of angina pectoris: Secondary | ICD-10-CM | POA: Diagnosis not present

## 2016-05-01 DIAGNOSIS — I4891 Unspecified atrial fibrillation: Secondary | ICD-10-CM | POA: Diagnosis not present

## 2016-05-01 LAB — BASIC METABOLIC PANEL
ANION GAP: 5 (ref 5–15)
BUN: 17 mg/dL (ref 6–20)
CALCIUM: 8.6 mg/dL — AB (ref 8.9–10.3)
CO2: 23 mmol/L (ref 22–32)
Chloride: 109 mmol/L (ref 101–111)
Creatinine, Ser: 1.12 mg/dL (ref 0.61–1.24)
GFR, EST NON AFRICAN AMERICAN: 59 mL/min — AB (ref >60)
Glucose, Bld: 107 mg/dL — ABNORMAL HIGH (ref 65–99)
POTASSIUM: 4.3 mmol/L (ref 3.5–5.1)
Sodium: 137 mmol/L (ref 135–145)

## 2016-05-01 LAB — CBC
HEMATOCRIT: 36.6 % — AB (ref 39.0–52.0)
HEMOGLOBIN: 12 g/dL — AB (ref 13.0–17.0)
MCH: 30.5 pg (ref 26.0–34.0)
MCHC: 32.8 g/dL (ref 30.0–36.0)
MCV: 92.9 fL (ref 78.0–100.0)
Platelets: 180 10*3/uL (ref 150–400)
RBC: 3.94 MIL/uL — AB (ref 4.22–5.81)
RDW: 14.2 % (ref 11.5–15.5)
WBC: 4.5 10*3/uL (ref 4.0–10.5)

## 2016-05-01 MED ORDER — ANGIOPLASTY BOOK
Freq: Once | Status: AC
  Administered 2016-05-01
  Filled 2016-05-01: qty 1

## 2016-05-01 MED ORDER — NITROGLYCERIN 0.4 MG SL SUBL: 0.4000 mg | SUBLINGUAL_TABLET | SUBLINGUAL | 3 refills | Status: DC | PRN

## 2016-05-01 MED FILL — Nitroglycerin IV Soln 200 MCG/ML in D5W: INTRAVENOUS | Qty: 250 | Status: AC

## 2016-05-01 MED FILL — Heparin Sodium (Porcine) 2 Unit/ML in Sodium Chloride 0.9%: INTRAMUSCULAR | Qty: 500 | Status: AC

## 2016-05-01 MED FILL — Heparin Sodium (Porcine) Inj 1000 Unit/ML: INTRAMUSCULAR | Qty: 10 | Status: AC

## 2016-05-01 MED FILL — Verapamil HCl IV Soln 2.5 MG/ML: INTRAVENOUS | Qty: 4 | Status: AC

## 2016-05-01 NOTE — Telephone Encounter (Signed)
New message  TCM per Ria Comment, appt on 12/8 @8 :60 w/Scott Kathlen Mody

## 2016-05-01 NOTE — Discharge Summary (Signed)
Discharge Summary    Patient ID: Ryan [name],  MRN: [id number], DOB/AGE: 80-Sep-1931 80 y.o.  Admit date: 04/30/2016 Discharge date: 05/01/2016  Primary Care Provider: Velna Hatchet Primary Cardiologist: Dr. Burt Knack  Discharge Diagnoses    Active Problems:   Coronary artery disease of native artery of native heart with stable angina pectoris [facility])   CAD (coronary artery disease), native coronary artery   Allergies Allergies  Allergen Reactions  . Ciprofloxacin Anaphylaxis  . Hydrochlorothiazide Anaphylaxis  . Ace Inhibitors Cough  . Losartan Cough  . Sulfa Antibiotics     unknown    Diagnostic Studies/Procedures    LHC: 04/30/16  Conclusion   Successful rotational atherectomy and stenting of the right coronary artery with a 3.0 x 12 mm Synergy DES.  Recommendations: Triple therapy with aspirin 81 mg, Plavix 75 mg, and apixaban 30 days, then discontinue aspirin. Plans for TAVR in approximately 3-4 weeks.   _____________   History of Present Illness     82 yo male with PMH of AS, CAD, AF with RVR, tachy-brady syndrome requiring PPM who presented to the office with Dr. Burt Knack to discuss treatment options regarding TAVR work-up for progressive fatigue and exercise intolerance over the previous months. Underwent cardiac catheterization showing multivessel CAD in the RCA and LCx. Planned for follow up cath with rotational atherectomy and possible stenting of the RCA and OM with femoral approach.   Hospital Course     Consultants: None   He presented for outpatient cardiac catheterization on 11/29 with Dr. Burt Knack. Had successful rotational atherectomy and stenting of the RCA with DES. Plan to continue on triple therapy with ASA 81mg , Plavix, and Eliquis for one month, then stop ASA. He was observed overnight without any noted complications. Labs were stable post procedure.   He was seen and assessed by Dr. Irish Lack on 11/30 and determined stable for discharge  home. I have arranged for follow up in the office next week. Physical exam per MD on day of discharge. Plans to proceed with TAVR in 3-4 weeks per Dr. York Cerise note.  _____________  Discharge Vitals Blood pressure (!) 152/55, pulse 66, temperature 97.9 F (36.6 C), temperature source Oral, resp. rate 13, height 5\' 7"  (1.702 m), weight 132 lb 4.4 oz (60 kg), SpO2 100 %.  Filed Weights   04/30/16 0928 05/01/16 0447  Weight: 131 lb (59.4 kg) 132 lb 4.4 oz (60 kg)    Labs & Radiologic Studies    CBC  Recent Labs  04/30/16 0947 05/01/16 0244  WBC 4.2 4.5  HGB 12.8* 12.0*  HCT 38.6* 36.6*  MCV 93.0 92.9  PLT 199 381   Basic Metabolic Panel  Recent Labs  04/30/16 0947 05/01/16 0244  NA 140 137  K 4.4 4.3  CL 106 109  CO2 26 23  GLUCOSE 103* 107*  BUN 17 17  CREATININE 1.13 1.12  CALCIUM 9.3 8.6*   Liver Function Tests No results for input(s): AST, ALT, ALKPHOS, BILITOT, PROT, ALBUMIN in the last 72 hours. No results for input(s): LIPASE, AMYLASE in the last 72 hours. Cardiac Enzymes No results for input(s): CKTOTAL, CKMB, CKMBINDEX, TROPONINI in the last 72 hours. BNP Invalid input(s): POCBNP D-Dimer No results for input(s): DDIMER in the last 72 hours. Hemoglobin A1C No results for input(s): HGBA1C in the last 72 hours. Fasting Lipid Panel No results for input(s): CHOL, HDL, LDLCALC, TRIG, CHOLHDL, LDLDIRECT in the last 72 hours. Thyroid Function Tests No results for input(s): TSH, T4TOTAL,  T3FREE, THYROIDAB in the last 72 hours.  Invalid input(s): FREET3 _____________  No results found. Disposition   Pt is being discharged home today in good condition.  Follow-up Plans & Appointments    Follow-up Information    Richardson Dopp, PA-C Follow up on 05/09/2016.   Specialties:  Cardiology, Physician Assistant Why:  at 8:30am for your follow up appt.  Contact information: [address] [address] [phone]           Discharge Instructions    Amb Referral to Cardiac Rehabilitation    Complete by:  As directed    Diagnosis:   Coronary Stents PTCA     Call MD for:  redness, tenderness, or signs of infection (pain, swelling, redness, odor or green/yellow discharge around incision site)    Complete by:  As directed    Diet - low sodium heart healthy    Complete by:  As directed    Discharge instructions    Complete by:  As directed    Groin Site Care Refer to this sheet in the next few weeks. These instructions provide you with information on caring for yourself after your procedure. Your caregiver may also give you more specific instructions. Your treatment has been planned according to current medical practices, but problems sometimes occur. Call your caregiver if you have any problems or questions after your procedure. HOME CARE INSTRUCTIONS You may shower 24 hours after the procedure. Remove the bandage (dressing) and gently wash the site with plain soap and water. Gently pat the site dry.  Do not apply powder or lotion to the site.  Do not sit in a bathtub, swimming pool, or whirlpool for 5 to 7 days.  No bending, squatting, or lifting anything over 10 pounds (4.5 kg) as directed by your caregiver.  Inspect the site at least twice daily.  Do not drive home if you are discharged the same day of the procedure. Have someone else drive you.  You may drive 24 hours after the procedure unless otherwise instructed by your caregiver.  What to expect: Any bruising will usually fade within 1 to 2 weeks.  Blood that collects in the tissue (hematoma) may be painful to the touch. It should usually decrease in size and tenderness within 1 to 2 weeks.  SEEK IMMEDIATE MEDICAL CARE IF: You have unusual pain at the groin site or down the affected leg.  You have redness, warmth, swelling, or pain at the groin site.  You have drainage (other than a small amount of blood on the dressing).  You have chills.  You  have a fever or persistent symptoms for more than 72 hours.  You have a fever and your symptoms suddenly get worse.  Your leg becomes pale, cool, tingly, or numb.  You have heavy bleeding from the site. Hold pressure on the site. .   Continue with your home medications as noted on your summary. You will need to stop your 81mg  aspirin after one month, but continue Eliquis and Plavix.   Increase activity slowly    Complete by:  As directed       Discharge Medications   Current Discharge Medication List    START taking these medications   Details  nitroGLYCERIN (NITROSTAT) 0.4 MG SL tablet Place 1 tablet (0.4 mg total) under the tongue every 5 (five) minutes as needed. Qty: 25 tablet, Refills: 3      CONTINUE these medications which have NOT CHANGED  Details  amiodarone (PACERONE) 200 MG tablet Take 1 tablet (200 mg total) by mouth daily. Qty: 90 tablet, Refills: 3    apixaban (ELIQUIS) 2.5 MG TABS tablet Take 1 tablet (2.5 mg total) by mouth 2 (two) times daily. Qty: 60 tablet, Refills: 10    aspirin 81 MG tablet Take 81 mg by mouth daily.    atorvastatin (LIPITOR) 40 MG tablet Take 1 tablet (40 mg total) by mouth daily. Qty: 30 tablet, Refills: 5    cholecalciferol (VITAMIN D) 1000 units tablet Take 1,000 Units by mouth 2 (two) times a week.     clopidogrel (PLAVIX) 75 MG tablet Take 1 tablet (75 mg total) by mouth daily. Qty: 90 tablet, Refills: 3    Coenzyme Q10 (COQ-10 PO) Take 1 capsule by mouth 2 (two) times a week.     latanoprost (XALATAN) 0.005 % ophthalmic solution Place 1 drop into both eyes at bedtime.    lisinopril (PRINIVIL,ZESTRIL) 20 MG tablet Take 20 mg by mouth daily.          Outstanding Labs/Studies   None  Duration of Discharge Encounter   Greater than 30 minutes including physician time.  Signed, Dameion Briles NP-C 05/01/2016, 10:14 AM   I have examined the patient and reviewed assessment and plan and discussed with patient.  Agree  with above as stated.  Labs stable.  Did well walking.  Systolic murmur noted.  No right groin hematoma.  Palpable right DP pulse. Discussed anticoagulation strategy for the next month and beyond.  TAVR in Jan.  Larae Grooms

## 2016-05-01 NOTE — Care Management Note (Signed)
Case Management Note  Patient Details  Name: [name] MRN: [id number] Date of Birth: [birth year]  Subjective/Objective:     S/p coronary graft atherectomy , DES, will be on triple therapy ASA, Plavix, and Eliquis, which he was already on.  Plan for TAVR in 3-4 weeks.  Patient is for dc today, pta indep.  Lives with [facility],has medication coverage, pcp and transportation at dc.                Action/Plan:   Expected Discharge Date:                  Expected Discharge Plan:  Home/Self Care  In-House Referral:     Discharge planning Services  CM Consult  Post Acute Care Choice:    Choice offered to:     DME Arranged:    DME Agency:     HH Arranged:    HH Agency:     Status of Service:  Completed, signed off  If discussed at [employer] of Stay Meetings, dates discussed:    Additional Comments:  Zenon Mayo, RN 05/01/2016, 11:08 AM

## 2016-05-01 NOTE — Telephone Encounter (Signed)
Now discharged.

## 2016-05-01 NOTE — Progress Notes (Signed)
CARDIAC REHAB PHASE I   PRE:  Rate/Rhythm: 43 SR    BP: sitting 152/55    SaO2:   MODE:  Ambulation: 500 ft   POST:  Rate/Rhythm: 93 pacing    BP: sitting 195/65     SaO2:   Pt ambulated without assist, quick pace. He sts he feels much improved, not so tired. However after sitting, he sts he feels tired. BP elevated. Pt is learning how to pay attention to his body as he loves to exercise. At home, if he plans to walk long distance, he uses a RW and walks slower to conserve energy. Ed completed. Good understanding. He is interested in [place] for after his TAVR and I will refer to [place]. Reinforced to pt to walk daily but pay attention to how he feels and the weather.  [place], ACSM 05/01/2016 8:47 AM

## 2016-05-01 NOTE — Telephone Encounter (Signed)
Still inpatient 11/30.

## 2016-05-02 NOTE — Telephone Encounter (Signed)
Patient contacted regarding discharge from [facility] hospital on 05/01/16.  Patient understands to follow up with provider on 05/09/16 at 8:45 am at [facility] office.  Patient understands discharge instructions?  Yes  Patient understands medications and regiment?  Yes  Patient understands to bring all medications to this visit?  Yes  Other details:

## 2016-05-08 NOTE — Progress Notes (Signed)
Cardiology Office Note:    Date:  05/09/2016   ID:  Ryan [name], DOB Oct 25, 1929, MRN [id number]  PCP:  Velna Hatchet, MD  Cardiologist:  Dr. Ena Dawley  TAVR: Dr. Sherren Mocha   Electrophysiologist:  Dr. Cristopher Peru   Referring MD: Velna Hatchet, MD   Chief Complaint  Patient presents with  . Hospitalization Follow-up    s/p PCI    History of Present Illness:    Ryan [name] is a 82 y.o. male with a hx of carotid artery disease s/p bilateral CEA, CAD, aortic stenosis, HTN, HL, prior SVT, OSA, diastolic CHF, paroxysmal atrial fibrillation.  Echo in 5/17 demonstrated mod aortic stenosis with mean gradient 25 mmHg. In 8/17, he developed worsening dyspnea with exertion and fatigue. Cardiac catheterization demonstrated worsening CAD with 70% mid LCx, 80% OM1 and 80% mid RCA stenosis. PCI would be high risk. Medical therapy was recommended initially. He then saw Dr. Roxy Manns in surgical consultation. He was felt to have stage D severe symptomatic aortic stenosis. Work up for AVR vs TAVR was initiated.  He was then admitted in 10/17 with unstable angina in the setting of AF with RVR. This was complicated by tachybradycardia syndrome and he underwent pacemaker implantation with Dr. Lovena Le. In 11/17, he saw Dr. Burt Knack who recommended proceeding with PCI of the RCA and possibly the OM1. Following PCI, the plan would be to move forward with TAVR. He underwent successful rotational atherectomy and stenting of the RCA with a 3 x 12 mm Synergy DES on 04/30/16. He was discharged on aspirin, Plavix and apixaban with plans to discontinue aspirin after 30 days.  He returns for Cardiology follow up. He is here today with his wife. Since DC from the hospital, he feels about the same. Most distressing for him is fatigue and dyspnea with mild to moderate activities. He denies chest discomfort. He has been dizzy at times. He denies syncope. He denies orthopnea, PND or edema. He did have an episode  of epistaxis 2 days ago. He decided to hold his Eliquis. The epistaxis has resolved.   Prior CV studies that were reviewed today include:    PCI 04/30/16 Rotational atherectomy and stenting with a 3 x 12 mm Synergy DES to the mid RCA Recommendations: Triple therapy with aspirin 81 mg, Plavix 75 mg, and apixaban 30 days, then discontinue aspirin. Plans for TAVR in approximately 3-4 weeks.  Carotid US 03/18/16 R 1-39; L 40-59  R/L [facility] 01/17/16 LAD proximal 40 LCx mid 70, OM1 80 RCA proximal 20, mid 80 EF 55-65 Moderate to severe aortic stenosis with AVA 1.04, peak gradient 26, mean gradient 25.1  Echo 10/10/15 Mild LVH, EF 60-65, normal wall motion, grade 1 diastolic dysfunction, moderate aortic stenosis (mean 25, peak 60), trivial MR, mild LAE, mild TR  Past Medical History:  Diagnosis Date  . Age-related macular degeneration   . Allergic rhinitis   . Aortic valve stenosis   . Arthritis    "hands, lower back" (04/30/2016)  . Asthma   . Carotid artery obstruction   . Chronic lower back pain   . Coronary arteriosclerosis in native artery   . Diverticulitis of colon   . DJD (degenerative joint disease)   . Dysthymia   . GERD (gastroesophageal reflux disease)   . History of elevated PSA 05/2009  . HLD (hyperlipidemia)   . Hypertension   . Nocturnal hypoxemia   . OSA (obstructive sleep apnea)    intolerant to CPAP  . Paroxysmal  supraventricular tachycardia ([place])   . Pneumonia    "when I was a kid"  . Presence of permanent cardiac pacemaker   . Primary malignant neoplasm of bladder ([place])   . Prostate cancer [facility])     Past Surgical History:  Procedure Laterality Date  . APPENDECTOMY    . CARDIAC CATHETERIZATION N/A 01/17/2016   Procedure: Right/Left Heart Cath and Coronary Angiography;  Surgeon: Belva Crome, MD;  Location: [place] [facility];  Service: Cardiovascular;  Laterality: N/A;  . CARDIAC CATHETERIZATION N/A 04/30/2016   Procedure: Coronary/Graft Atherectomy;   Surgeon: Sherren Mocha, MD;  Location: [place] [facility];  Service: Cardiovascular;  Laterality: N/A;  . CAROTID ENDARTERECTOMY Bilateral   . CATARACT EXTRACTION W/ INTRAOCULAR LENS  IMPLANT, BILATERAL Bilateral   . COLON SURGERY  1981   Meckles Diverticulum with volvulus  . EP IMPLANTABLE DEVICE N/A 03/21/2016   Procedure: Pacemaker Implant;  Surgeon: Evans Lance, MD;  Location: [place] [facility];  Service: Cardiovascular;  Laterality: N/A;  . INGUINAL HERNIA REPAIR Right 2009  . INSERTION PROSTATE RADIATION SEED    . TONSILLECTOMY  ~ 1947    Current Medications: Current Meds  Medication Sig  . amiodarone (PACERONE) 200 MG tablet Take 1 tablet (200 mg total) by mouth daily.  Marland Kitchen apixaban (ELIQUIS) 2.5 MG TABS tablet Take 1 tablet (2.5 mg total) by mouth 2 (two) times daily.  Marland Kitchen aspirin 81 MG tablet Take 81 mg by mouth daily.  Marland Kitchen atorvastatin (LIPITOR) 40 MG tablet Take 1 tablet (40 mg total) by mouth daily.  . cholecalciferol (VITAMIN D) 1000 units tablet Take 1,000 Units by mouth 2 (two) times a week.   . clopidogrel (PLAVIX) 75 MG tablet Take 1 tablet (75 mg total) by mouth daily.  . Coenzyme Q10 (COQ-10 PO) Take 1 capsule by mouth 2 (two) times a week.   . latanoprost (XALATAN) 0.005 % ophthalmic solution Place 1 drop into both eyes at bedtime.  . nitroGLYCERIN (NITROSTAT) 0.4 MG SL tablet Place 1 tablet (0.4 mg total) under the tongue every 5 (five) minutes as needed.  . [DISCONTINUED] lisinopril (PRINIVIL,ZESTRIL) 20 MG tablet Take 20 mg by mouth daily.     Allergies:   Ciprofloxacin; Hydrochlorothiazide; Ace inhibitors; Losartan; and Sulfa antibiotics   Social History   Social History  . Marital status: Married    Spouse name: N/A  . Number of children: N/A  . Years of education: N/A   Occupational History  . retired    Social History Main Topics  . Smoking status: Never Smoker  . Smokeless tobacco: Never Used  . Alcohol use 0.0 oz/week     Comment: 04/30/2016  "nothing in years"  . Drug use: No  . Sexual activity: No   Other Topics Concern  . None   Social History Narrative  . None     Family History:  The patient's family history includes Heart disease in his father and mother.   ROS:   Please see the history of present illness.    ROS All other systems reviewed and are negative.   EKGs/Labs/Other Test Reviewed:    EKG:  EKG is  ordered today.  The ekg ordered today demonstrates NSR, HR 71, LBBB  Recent Labs: 03/18/2016: ALT 19; B Natriuretic Peptide 38.4; TSH 2.194 05/01/2016: BUN 17; Creatinine, Ser 1.12; Hemoglobin 12.0; Platelets 180; Potassium 4.3; Sodium 137   Recent Lipid Panel    Component Value Date/Time   CHOL 147 03/20/2016 0422  TRIG 54 03/20/2016 0422   HDL 61 03/20/2016 0422   CHOLHDL 2.4 03/20/2016 0422   VLDL 11 03/20/2016 0422   LDLCALC 75 03/20/2016 0422     Physical Exam:    VS:  BP 124/72   Pulse 71   Ht 5\' 7"  (1.702 m)   Wt 131 lb (59.4 kg)   BMI 20.52 kg/m     Wt Readings from Last 3 Encounters:  05/09/16 131 lb (59.4 kg)  05/01/16 132 lb 4.4 oz (60 kg)  04/18/16 131 lb 6.4 oz (59.6 kg)     Physical Exam  Constitutional: He is oriented to person, place, and time. He appears well-developed and well-nourished. No distress.  HENT:  Head: Normocephalic and atraumatic.  Eyes: No scleral icterus.  Neck: No JVD present.  Cardiovascular: Normal rate and regular rhythm.   Murmur heard.  Harsh crescendo-decrescendo systolic murmur is present with a grade of 3/6  at the upper right sternal border Pulmonary/Chest: He has no wheezes. He has no rales.  Abdominal: Soft. There is no tenderness.  Musculoskeletal: He exhibits no edema.  R groin without hematoma or bruit  Neurological: He is alert and oriented to person, place, and time.  Skin: Skin is warm and dry.  Psychiatric: He has a normal mood and affect.    ASSESSMENT:    1. Coronary artery disease involving native coronary artery of  native heart without angina pectoris   2. Aortic valve stenosis, etiology of cardiac valve disease unspecified   3. Persistent atrial fibrillation ([place])   4. Chronic diastolic CHF (congestive heart failure) ([place])   5. Hypertensive heart disease with heart failure ([place])   6. Mixed hyperlipidemia   7. Cardiac pacemaker in situ    PLAN:    In order of problems listed above:  1. CAD - s/p PCI with DES to RCA.  He denies further angina since his PCI. Residual disease will be treated medically. Continue aspirin, Plavix, statin. He knows to discontinue aspirin 30 days post PCI.  2. Aortic stenosis - Symptomatic severe stage D.  Unfortunately, he continues to have symptoms of dyspnea with exertion and fatigue. We discussed that these symptoms are all likely related to his aortic stenosis. He has follow-up already arranged with Dr. Cyndia Bent in January.  3. Persistent AF - Maintaining normal sinus rhythm. He remains on amiodarone and Eliquis. He did stop his Eliquis 2 days ago secondary to epistaxis. I have asked him to resume this tonight. If he has recurrent epistaxis, he can hold his Eliquis for 2-3 days. However, I have asked him to call our office so that we can arrange ENT evaluation for possible cauterization.  4. Chronic diastolic CHF - Volume appears stable. He is NYHA 2b/3.  5. HTN - Blood pressure is normal. However, he has had some dizziness especially with standing. I will decrease lisinopril to 10 mg daily.  6. HL - Continue statin.  7. PPM - FU with EP as planned.   Medication Adjustments/Labs and Tests Ordered: Current medicines are reviewed at length with the patient today.  Concerns regarding medicines are outlined above.  Medication changes, Labs and Tests ordered today are outlined in the Patient Instructions noted below. Patient Instructions  Medication Instructions:  1. DECREASE LISINOPRIL TO 10 MG DAILY' NEW RX HAS BEEN SENT IN FOR THE 10 MG TABLET 2. RESUME ELIQUIS TONIGHT    3. YOUR LAST DOSE OF ASPIRIN WILL BE ON 05/30/16  Labwork: NONE  Testing/Procedures: NONE  Follow-Up: KEEP  YOUR APPT AS SCHEDULED WITH DR. Meda Coffee ON 05/22/16  Any Other Special Instructions Will Be Listed Below (If Applicable).  If you need a refill on your cardiac medications before your next appointment, please call your pharmacy.  Signed, Richardson Dopp, PA-C  05/09/2016 11:23 AM    [facility] [place], [place], [place]  [address] Phone: [phone]; Fax: [phone]

## 2016-05-09 ENCOUNTER — Other Ambulatory Visit: Payer: Self-pay | Admitting: *Deleted

## 2016-05-09 ENCOUNTER — Ambulatory Visit (INDEPENDENT_AMBULATORY_CARE_PROVIDER_SITE_OTHER): Payer: Medicare Other | Admitting: Physician Assistant

## 2016-05-09 ENCOUNTER — Encounter: Payer: Self-pay | Admitting: Physician Assistant

## 2016-05-09 VITALS — BP 124/72 | HR 71 | Ht 67.0 in | Wt 131.0 lb

## 2016-05-09 DIAGNOSIS — I2510 Atherosclerotic heart disease of native coronary artery without angina pectoris: Secondary | ICD-10-CM

## 2016-05-09 DIAGNOSIS — I5032 Chronic diastolic (congestive) heart failure: Secondary | ICD-10-CM | POA: Diagnosis not present

## 2016-05-09 DIAGNOSIS — I350 Nonrheumatic aortic (valve) stenosis: Secondary | ICD-10-CM | POA: Diagnosis not present

## 2016-05-09 DIAGNOSIS — I4819 Other persistent atrial fibrillation: Secondary | ICD-10-CM

## 2016-05-09 DIAGNOSIS — I481 Persistent atrial fibrillation: Secondary | ICD-10-CM

## 2016-05-09 DIAGNOSIS — I110 Hypertensive heart disease with heart failure: Secondary | ICD-10-CM

## 2016-05-09 DIAGNOSIS — E782 Mixed hyperlipidemia: Secondary | ICD-10-CM

## 2016-05-09 DIAGNOSIS — Z950 Presence of cardiac pacemaker: Secondary | ICD-10-CM

## 2016-05-09 MED ORDER — LISINOPRIL 10 MG PO TABS: 10.0000 mg | ORAL_TABLET | Freq: Every day | ORAL | 3 refills | Status: DC

## 2016-05-09 NOTE — Patient Instructions (Addendum)
Medication Instructions:  1. DECREASE LISINOPRIL TO 10 MG DAILY' NEW RX HAS BEEN SENT IN FOR THE 10 MG TABLET 2. RESUME ELIQUIS TONIGHT  3. YOUR LAST DOSE OF ASPIRIN WILL BE ON 05/30/16  Labwork: NONE  Testing/Procedures: NONE  Follow-Up: KEEP YOUR APPT AS SCHEDULED WITH DR. Meda Coffee ON 05/22/16  Any Other Special Instructions Will Be Listed Below (If Applicable).  If you need a refill on your cardiac medications before your next appointment, please call your pharmacy.

## 2016-05-14 ENCOUNTER — Encounter: Payer: Self-pay | Admitting: Cardiology

## 2016-05-16 ENCOUNTER — Ambulatory Visit: Payer: Medicare Other | Admitting: Cardiology

## 2016-05-22 ENCOUNTER — Ambulatory Visit (INDEPENDENT_AMBULATORY_CARE_PROVIDER_SITE_OTHER): Payer: Medicare Other | Admitting: Cardiology

## 2016-05-22 DIAGNOSIS — Z950 Presence of cardiac pacemaker: Secondary | ICD-10-CM

## 2016-05-22 DIAGNOSIS — I110 Hypertensive heart disease with heart failure: Secondary | ICD-10-CM | POA: Diagnosis not present

## 2016-05-22 DIAGNOSIS — I350 Nonrheumatic aortic (valve) stenosis: Secondary | ICD-10-CM | POA: Diagnosis not present

## 2016-05-22 DIAGNOSIS — I2510 Atherosclerotic heart disease of native coronary artery without angina pectoris: Secondary | ICD-10-CM

## 2016-05-22 LAB — CBC WITH DIFFERENTIAL/PLATELET
Basophils Absolute: 52 cells/uL (ref 0–200)
Basophils Relative: 1 %
Eosinophils Absolute: 104 cells/uL (ref 15–500)
Eosinophils Relative: 2 %
HCT: 40.4 % (ref 38.5–50.0)
Hemoglobin: 13 g/dL — ABNORMAL LOW (ref 13.2–17.1)
Lymphocytes Relative: 21 %
Lymphs Abs: 1092 cells/uL (ref 850–3900)
MCH: 30.2 pg (ref 27.0–33.0)
MCHC: 32.2 g/dL (ref 32.0–36.0)
MCV: 93.7 fL (ref 80.0–100.0)
MPV: 9.7 fL (ref 7.5–12.5)
Monocytes Absolute: 572 cells/uL (ref 200–950)
Monocytes Relative: 11 %
Neutro Abs: 3380 cells/uL (ref 1500–7800)
Neutrophils Relative %: 65 %
Platelets: 274 10*3/uL (ref 140–400)
RBC: 4.31 MIL/uL (ref 4.20–5.80)
RDW: 13.9 % (ref 11.0–15.0)
WBC: 5.2 10*3/uL (ref 3.8–10.8)

## 2016-05-22 LAB — COMPREHENSIVE METABOLIC PANEL
ALT: 36 U/L (ref 9–46)
AST: 32 U/L (ref 10–35)
Albumin: 4.1 g/dL (ref 3.6–5.1)
Alkaline Phosphatase: 77 U/L (ref 40–115)
BUN: 26 mg/dL — ABNORMAL HIGH (ref 7–25)
CO2: 27 mmol/L (ref 20–31)
Calcium: 9.2 mg/dL (ref 8.6–10.3)
Chloride: 102 mmol/L (ref 98–110)
Creat: 1.18 mg/dL — ABNORMAL HIGH (ref 0.70–1.11)
Glucose, Bld: 102 mg/dL — ABNORMAL HIGH (ref 65–99)
Potassium: 4.5 mmol/L (ref 3.5–5.3)
Sodium: 137 mmol/L (ref 135–146)
Total Bilirubin: 0.3 mg/dL (ref 0.2–1.2)
Total Protein: 6.1 g/dL (ref 6.1–8.1)

## 2016-05-22 NOTE — Progress Notes (Signed)
Cardiology Office Note:    Date:  05/26/2016   ID:  [name], DOB 17-Jul-1929, MRN [id number]  PCP:  Velna Hatchet, MD  Cardiologist:  Dr. Ena Dawley  TAVR: Dr. Sherren Mocha   Electrophysiologist:  Dr. Cristopher Peru   Referring MD: Velna Hatchet, MD   No chief complaint on file.   History of Present Illness:    Ryan [name] is a 82 y.o. male with a hx of carotid artery disease s/p bilateral CEA, CAD, aortic stenosis, HTN, HL, prior SVT, OSA, diastolic CHF, paroxysmal atrial fibrillation.  Echo in 5/17 demonstrated mod aortic stenosis with mean gradient 25 mmHg. In 8/17, he developed worsening dyspnea with exertion and fatigue. Cardiac catheterization demonstrated worsening CAD with 70% mid LCx, 80% OM1 and 80% mid RCA stenosis. PCI would be high risk. Medical therapy was recommended initially. He then saw Dr. Roxy Manns in surgical consultation. He was felt to have stage D severe symptomatic aortic stenosis. Work up for AVR vs TAVR was initiated.  He was then admitted in 10/17 with unstable angina in the setting of AF with RVR. This was complicated by tachybradycardia syndrome and he underwent pacemaker implantation with Dr. Lovena Le.   05/22/2016 - He returns for Cardiology follow up. In 11/17, he underwent successful rotational atherectomy and stenting of the RCA with a 3 x 12 mm Synergy DES on 04/30/16 by Dr Burt Knack. He was discharged on aspirin, Plavix and apixaban with plans to discontinue aspirin after 30 days. Apixaban to be stopped 5 days prior to TAVR. TAVR is scheduled for 06/11/2015. He states that he continues to have dyspnea on minimal exertion, dizziness with exertion. No syncope. He continues to have daily epistaxis.   Prior CV studies that were reviewed today include:    PCI 04/30/16 Rotational atherectomy and stenting with a 3 x 12 mm Synergy DES to the mid RCA Recommendations: Triple therapy with aspirin 81 mg, Plavix 75 mg, and apixaban 30 days, then  discontinue aspirin. Plans for TAVR in approximately 3-4 weeks.  Carotid US 03/18/16 R 1-39; L 40-59  R/L [facility] 01/17/16 LAD proximal 40 LCx mid 70, OM1 80 RCA proximal 20, mid 80 EF 55-65 Moderate to severe aortic stenosis with AVA 1.04, peak gradient 26, mean gradient 25.1  Echo 10/10/15 Mild LVH, EF 60-65, normal wall motion, grade 1 diastolic dysfunction, moderate aortic stenosis (mean 25, peak 60), trivial MR, mild LAE, mild TR  Past Medical History:  Diagnosis Date  . Age-related macular degeneration   . Allergic rhinitis   . Aortic valve stenosis   . Arthritis    "hands, lower back" (04/30/2016)  . Asthma   . Carotid artery obstruction   . Chronic lower back pain   . Coronary arteriosclerosis in native artery   . Diverticulitis of colon   . DJD (degenerative joint disease)   . Dysthymia   . GERD (gastroesophageal reflux disease)   . History of elevated PSA 05/2009  . HLD (hyperlipidemia)   . Hypertension   . Nocturnal hypoxemia   . OSA (obstructive sleep apnea)    intolerant to CPAP  . Paroxysmal supraventricular tachycardia ([place])   . Pneumonia    "when I was a kid"  . Presence of permanent cardiac pacemaker   . Primary malignant neoplasm of bladder ([place])   . Prostate cancer [facility])     Past Surgical History:  Procedure Laterality Date  . APPENDECTOMY    . CARDIAC CATHETERIZATION N/A 01/17/2016   Procedure: Right/Left Heart Cath  and Coronary Angiography;  Surgeon: Belva Crome, MD;  Location: [place] [facility];  Service: Cardiovascular;  Laterality: N/A;  . CARDIAC CATHETERIZATION N/A 04/30/2016   Procedure: Coronary/Graft Atherectomy;  Surgeon: Sherren Mocha, MD;  Location: [place] [facility];  Service: Cardiovascular;  Laterality: N/A;  . CAROTID ENDARTERECTOMY Bilateral   . CATARACT EXTRACTION W/ INTRAOCULAR LENS  IMPLANT, BILATERAL Bilateral   . COLON SURGERY  1981   Meckles Diverticulum with volvulus  . EP IMPLANTABLE DEVICE N/A 03/21/2016   Procedure:  Pacemaker Implant;  Surgeon: Evans Lance, MD;  Location: [place] [facility];  Service: Cardiovascular;  Laterality: N/A;  . INGUINAL HERNIA REPAIR Right 2009  . INSERTION PROSTATE RADIATION SEED    . TONSILLECTOMY  ~ 1947    Current Medications: No outpatient prescriptions have been marked as taking for the 05/22/16 encounter (Office Visit) with Dorothy Spark, MD.     Allergies:   Ciprofloxacin; Hydrochlorothiazide; Ace inhibitors; Losartan; and Sulfa antibiotics   Social History   Social History  . Marital status: Married    Spouse name: N/A  . Number of children: N/A  . Years of education: N/A   Occupational History  . retired    Social History Main Topics  . Smoking status: Never Smoker  . Smokeless tobacco: Never Used  . Alcohol use 0.0 oz/week     Comment: 04/30/2016 "nothing in years"  . Drug use: No  . Sexual activity: No   Other Topics Concern  . Not on file   Social History Narrative  . No narrative on file     Family History:  The patient's family history includes Heart disease in his father and mother.   ROS:   Please see the history of present illness.    ROS All other systems reviewed and are negative.   EKGs/Labs/Other Test Reviewed:    EKG:  EKG is  ordered today.  The ekg ordered today demonstrates NSR, HR 71, LBBB  Recent Labs: 03/18/2016: B Natriuretic Peptide 38.4; TSH 2.194 05/22/2016: ALT 36; BUN 26; Creat 1.18; Hemoglobin 13.0; Platelets 274; Potassium 4.5; Sodium 137   Recent Lipid Panel    Component Value Date/Time   CHOL 147 03/20/2016 0422   TRIG 54 03/20/2016 0422   HDL 61 03/20/2016 0422   CHOLHDL 2.4 03/20/2016 0422   VLDL 11 03/20/2016 0422   LDLCALC 75 03/20/2016 0422     Physical Exam:    VS:  There were no vitals taken for this visit.  BP 144/64, HR 72  Wt Readings from Last 3 Encounters:  05/09/16 131 lb (59.4 kg)  05/01/16 132 lb 4.4 oz (60 kg)  04/18/16 131 lb 6.4 oz (59.6 kg)     Physical Exam    Constitutional: He is oriented to person, place, and time. He appears well-developed and well-nourished. No distress.  HENT:  Head: Normocephalic and atraumatic.  Eyes: No scleral icterus.  Neck: No JVD present.  Cardiovascular: Normal rate and regular rhythm.   Murmur heard.  Harsh crescendo-decrescendo systolic murmur is present with a grade of 3/6  at the upper right sternal border Pulmonary/Chest: He has no wheezes. He has no rales.  Abdominal: Soft. There is no tenderness.  Musculoskeletal: He exhibits no edema.  R groin without hematoma or bruit  Neurological: He is alert and oriented to person, place, and time.  Skin: Skin is warm and dry.  Psychiatric: He has a normal mood and affect.    ASSESSMENT:  1. Coronary artery disease involving native coronary artery of native heart without angina pectoris   2. Aortic valve stenosis, etiology of cardiac valve disease unspecified   3. Cardiac pacemaker in situ   4. Hypertensive heart disease with heart failure ([place])    PLAN:    In order of problems listed above:  1. CAD - s/p PCI with DES to RCA.  He denies further angina since his PCI. Residual disease will be treated medically. Continue aspirin, Plavix, statin. He knows to discontinue aspirin 30 days post PCI - on 06/01/2016.  2. Aortic stenosis - Symptomatic severe stage D.  Unfortunately, he continues to have symptoms of dyspnea with exertion and fatigue. TAVR on 06/11/2015  3. Persistent AF - Maintaining normal sinus rhythm. He remains on amiodarone and Eliquis. He continues to have epistaxis however Hb today 13.0. D/C Eliquis 5 days prior to TAVR - on 06/05/2015.  4. Chronic diastolic CHF - Volume appears stable. He is NYHA 2b/3.  5. HTN - Blood pressure is borderline, we will reevaluate after TAVR.   6. HL - Continue statin.  7. PPM - FU with EP as planned.   Medication Adjustments/Labs and Tests Ordered: Current medicines are reviewed at length with the patient  today.  Concerns regarding medicines are outlined above.  Medication changes, Labs and Tests ordered today are outlined in the Patient Instructions noted below. Patient Instructions  Medication Instructions:   Your physician recommends that you continue on your current medications as directed. Please refer to the Current Medication list given to you today.   Labwork:  TODAY--CMET AND CBC W DIFF    Follow-Up:  2 MONTHS WITH DR Meda Coffee       If you need a refill on your cardiac medications before your next appointment, please call your pharmacy.    Signed, Ena Dawley, MD  05/26/2016 4:04 PM    [facility] [place], [place], [place]  [address] Phone: [phone]; Fax: [phone]

## 2016-05-22 NOTE — Patient Instructions (Signed)
Medication Instructions:   Your physician recommends that you continue on your current medications as directed. Please refer to the Current Medication list given to you today.   Labwork:  TODAY--CMET AND CBC W DIFF    Follow-Up:  2 MONTHS WITH DR Meda Coffee       If you need a refill on your cardiac medications before your next appointment, please call your pharmacy.

## 2016-05-29 ENCOUNTER — Other Ambulatory Visit: Payer: Self-pay | Admitting: *Deleted

## 2016-05-29 DIAGNOSIS — I350 Nonrheumatic aortic (valve) stenosis: Secondary | ICD-10-CM

## 2016-06-04 ENCOUNTER — Encounter: Payer: Self-pay | Admitting: Surgery

## 2016-06-04 ENCOUNTER — Institutional Professional Consult (permissible substitution) (INDEPENDENT_AMBULATORY_CARE_PROVIDER_SITE_OTHER): Payer: Medicare Other | Admitting: Surgery

## 2016-06-04 VITALS — BP 188/89 | HR 71 | Resp 16 | Ht 66.0 in | Wt 134.0 lb

## 2016-06-04 DIAGNOSIS — I25119 Atherosclerotic heart disease of native coronary artery with unspecified angina pectoris: Secondary | ICD-10-CM

## 2016-06-04 DIAGNOSIS — I350 Nonrheumatic aortic (valve) stenosis: Secondary | ICD-10-CM

## 2016-06-04 DIAGNOSIS — R0602 Shortness of breath: Secondary | ICD-10-CM

## 2016-06-05 ENCOUNTER — Encounter: Payer: Self-pay | Admitting: Surgery

## 2016-06-05 NOTE — Pre-Procedure Instructions (Addendum)
    Ryan [name]  06/05/2016      [facility] - [address], [place] AT [facility] OF [address] [place] [address] Phone: [phone] Fax: [phone]    Your procedure is scheduled on Tues., Jan 9  Report to [facility] Admitting at 5:30 A.M.  Call this number if you have problems the morning of surgery:  [phone]   Remember:  Do not eat food or drink liquids after midnight on Mon. Jan.8   Take these medicines the morning of surgery with A SIP OF WATER :tylenol if needed, amiodarone (pacerone), eye drops, nitroglycerine if needed              Stop advil, motrin, ibuprofen, aleve, BC Powders, Goody's, vitamins/herbal medicines   Do not wear jewelry.  Do not wear lotions, powders, or cologne, or deoderant.  Do not shave 48 hours prior to surgery.  Men may shave face and neck.  Do not bring valuables to the hospital.  [facility] is not responsible for any belongings or valuables.  Contacts, dentures or bridgework may not be worn into surgery.  Leave your suitcase in the car.  After surgery it may be brought to your room.  For patients admitted to the hospital, discharge time will be determined by your treatment team.  Patients discharged the day of surgery will not be allowed to drive home.    Special instructions:  Review preparing for surgery handout  Please read over the following fact sheets that you were given. Coughing and Deep Breathing and MRSA Information

## 2016-06-05 NOTE — Progress Notes (Signed)
Patient ID: Ryan [name], male   DOB: 04-Jun-1929, 83 y.o.   MRN: [id number]   [place] [facility]ATION REPORT  Referring Provider is Dorothy Spark, MD PCP is Velna Hatchet, MD  Chief Complaint  Patient presents with  . Aortic Stenosis    2nd TAVR eval to review all studies    HPI:  The patient is an 83 year old gentleman with history of aortic stenosis, coronary artery disease, hypertension, cerebrovascular disease, peripheral vascular disease, and hyperlipidemia who was evaluated in August 2017 for progressive symptoms of exertional shortness of breath and burning pain in his upper chest and throat. His last echo in May 2017 showed moderate AS with a mean gradient of 25 mm Hg and a peak velocity ratio of 0.22 with an LVEF of 60-65%. He underwent cath on 01/17/2016 showing a mean aortic transvalvular gradient of 25 mm Hg and a calculated AVA of 1.04 cm2. Coronary angiography revealed multivessel coronary artery disease with an eccentric 80% stenosis of the mid right coronary artery, 80% ostial stenosis of a large first obtuse marginal branch and diffuse nonobstructive disease in the LAD. Medical therapy was recommended but he continued to have progressive exertional shortness of breath and pain in the upper chest and neck. He was seen by Dr. Roxy Manns for surgical evaluation and he was concerned about the RCA stenosis and felt that pre-op PCI was indicated. The patient was subsequently admitted with new onset atrial fibrillation and tachybrady syndrome and underwent implantation of a dual chamber pacemaker by Dr. Lovena Le on 03/21/2016. He then underwent PCI with rotational atherectomy and DES of the RCA on 04/30/2016 and was started on ASA, Plavix and Eliquis with plans for staged TAVR in 4 weeks. He says that he has had no further chest discomfort but still has shortness of breath and fatigue with minimal exertion  around the house. He has not been able to walk too much. He has had some dizzy spells and feels like he is in a mental fog in the afternoon but has had no syncope.  He is married and lives with his wife. They have been physically active walking daily and exercising in a swimming pool but he has not been able to do much lately due to symptoms. His ambulation is slowed by severe degenerative arthritis of both knees and he uses a walker if he needs to go very far.   Past Medical History:  Diagnosis Date  . Age-related macular degeneration   . Allergic rhinitis   . Aortic valve stenosis   . Arthritis    "hands, lower back" (04/30/2016)  . Asthma   . Carotid artery obstruction   . Chronic lower back pain   . Coronary arteriosclerosis in native artery   . Diverticulitis of colon   . DJD (degenerative joint disease)   . Dysthymia   . GERD (gastroesophageal reflux disease)   . History of elevated PSA 05/2009  . HLD (hyperlipidemia)   . Hypertension   . Nocturnal hypoxemia   . OSA (obstructive sleep apnea)    intolerant to CPAP  . Paroxysmal supraventricular tachycardia ([place])   . Pneumonia    "when I was a kid"  . Presence of permanent cardiac pacemaker   . Primary malignant neoplasm of bladder ([place])   . Prostate cancer [facility])     Past Surgical History:  Procedure Laterality Date  . APPENDECTOMY    . CARDIAC CATHETERIZATION N/A 01/17/2016  Procedure: Right/Left Heart Cath and Coronary Angiography;  Surgeon: Belva Crome, MD;  Location: [place] [facility];  Service: Cardiovascular;  Laterality: N/A;  . CARDIAC CATHETERIZATION N/A 04/30/2016   Procedure: Coronary/Graft Atherectomy;  Surgeon: Sherren Mocha, MD;  Location: [place] [facility];  Service: Cardiovascular;  Laterality: N/A;  . CAROTID ENDARTERECTOMY Bilateral   . CATARACT EXTRACTION W/ INTRAOCULAR LENS  IMPLANT, BILATERAL Bilateral   . COLON SURGERY  1981   Meckles Diverticulum with volvulus  . EP IMPLANTABLE DEVICE N/A  03/21/2016   Procedure: Pacemaker Implant;  Surgeon: Evans Lance, MD;  Location: [place] [facility];  Service: Cardiovascular;  Laterality: N/A;  . INGUINAL HERNIA REPAIR Right 2009  . INSERTION PROSTATE RADIATION SEED    . TONSILLECTOMY  ~ 1947    Family History  Problem Relation Age of Onset  . Heart disease Mother   . Heart disease Father     Social History   Social History  . Marital status: Married    Spouse name: N/A  . Number of children: N/A  . Years of education: N/A   Occupational History  . retired    Social History Main Topics  . Smoking status: Never Smoker  . Smokeless tobacco: Never Used  . Alcohol use 0.0 oz/week     Comment: 04/30/2016 "nothing in years"  . Drug use: No  . Sexual activity: No   Other Topics Concern  . Not on file   Social History Narrative  . No narrative on file    Current Outpatient Prescriptions  Medication Sig Dispense Refill  . acetaminophen (TYLENOL) 500 MG tablet Take 500 mg by mouth every 6 (six) hours as needed for mild pain.    Marland Kitchen amiodarone (PACERONE) 200 MG tablet Take 1 tablet (200 mg total) by mouth daily. 90 tablet 3  . apixaban (ELIQUIS) 2.5 MG TABS tablet Take 1 tablet (2.5 mg total) by mouth 2 (two) times daily. 60 tablet 10  . aspirin 81 MG tablet Take 81 mg by mouth daily. Stopped prior to procedure    . atorvastatin (LIPITOR) 40 MG tablet Take 1 tablet (40 mg total) by mouth daily. (Patient taking differently: Take 40 mg by mouth daily at 6 PM. ) 30 tablet 5  . cholecalciferol (VITAMIN D) 1000 units tablet Take 1,000 Units by mouth 2 (two) times a week.     . clopidogrel (PLAVIX) 75 MG tablet Take 1 tablet (75 mg total) by mouth daily. (Patient taking differently: Take 75 mg by mouth every evening. ) 90 tablet 3  . Coenzyme Q10 (COQ-10 PO) Take 1 capsule by mouth 2 (two) times a week.     . latanoprost (XALATAN) 0.005 % ophthalmic solution Place 1 drop into both eyes at bedtime.    Marland Kitchen lisinopril  (PRINIVIL,ZESTRIL) 10 MG tablet Take 1 tablet (10 mg total) by mouth daily. 90 tablet 3  . nitroGLYCERIN (NITROSTAT) 0.4 MG SL tablet Place 1 tablet (0.4 mg total) under the tongue every 5 (five) minutes as needed. 25 tablet 3   No current facility-administered medications for this visit.     Allergies  Allergen Reactions  . Ciprofloxacin Anaphylaxis  . Hydrochlorothiazide Anaphylaxis  . Sulfa Antibiotics Anaphylaxis    unknown  . Ace Inhibitors Cough  . Losartan Cough      Review of Systems:     General:                      normal  appetite, decreased energy, no weight gain, no weight loss, no fever             Cardiac:                       + chest pain with exertion, no chest pain at rest, + SOB with exertion, no resting SOB, no PND, no orthopnea, + palpitations, + arrhythmia, no atrial fibrillation, no LE edema, + dizzy spells, + near syncope             Respiratory:                 + exertional shortness of breath, no home oxygen, no productive cough, + dry cough, no bronchitis, + wheezing, no hemoptysis, no asthma, no pain with inspiration or cough, + sleep apnea, no CPAP at night             GI:                               no difficulty swallowing, + reflux, no frequent heartburn, no hiatal hernia, no abdominal pain, no constipation, no diarrhea, no hematochezia, no hematemesis, no melena             GU:                              no dysuria,  + frequency, no urinary tract infection, no hematuria, no enlarged prostate, no kidney stones, no kidney disease             Vascular:                     no pain suggestive of claudication, no pain in feet, no leg cramps, no varicose veins, no DVT, no non-healing foot ulcer             Neuro:                         no stroke, no TIA's, no seizures, no headaches, no temporary blindness one eye,  no slurred speech, no peripheral neuropathy, + chronic pain, no instability of gait, mild memory/cognitive dysfunction              Musculoskeletal:         + arthritis, + joint swelling, no myalgias, some difficulty walking, slightly decreased mobility              Skin:                            no rash, no itching, no skin infections, no pressure sores or ulcerations             Psych:                         + anxiety, + depression, no nervousness, no unusual recent stress             Eyes:                           + blurry vision, no floaters, + recent vision changes, + wears glasses or contacts             ENT:                            +  hearing loss, no loose or painful teeth, no dentures, last saw dentist 2016             Hematologic:               + easy bruising, no abnormal bleeding, no clotting disorder, no frequent epistaxis             Endocrine:                   no diabetes, does not check CBG's at home              Physical Exam:   BP (!) 188/89 (BP Location: Right Arm, Patient Position: Sitting, Cuff Size: Large)   Pulse 71   Resp 16   Ht _0  (1.676 m)   Wt 134 lb (60.8 kg)   SpO2 99% Comment: ON RA  BMI 21.63 kg/m   General:  Elderly, thin but well-appearing  HEENT:  Unremarkable , NCAT, PERLA, EOMI, oropharynx clear  Neck:   no JVD, no bruits, no adenopathy or thyromegaly  Chest:   clear to auscultation, symmetrical breath sounds, no wheezes, no rhonchi   CV:   RRR, grade III/VI crescendo/decrescendo murmur heard best at RSB,  no diastolic murmur  Abdomen:  soft, non-tender, no masses or organomegaly  Extremities:  warm, well-perfused, pulses palpable in feet, no LE edema  Rectal/GU  Deferred  Neuro:   Grossly non-focal and symmetrical throughout  Skin:   Clean and dry, no rashes, no breakdown   Diagnostic Tests:   [name]                        [address], [place] [address]                            [phone]  ------------------------------------------------------------------- Transthoracic Echocardiography  Patient:     Orland, Visconti MR #:       [id number] Study Date: 10/10/2015 Gender:     M Age:        70 Height:     170.2 cm Weight:     58.1 kg BSA:        1.65 m^2 Pt. Status: Room:   ATTENDING    Lyman Bishop MD  Gaylene Brooks, M.D.  REFERRING    Ena Dawley, M.D.  PERFORMING   [facility], Outpatient  SONOGRAPHER  [facility], RDCS  cc:  ------------------------------------------------------------------- LV EF: 60% -   65%  ------------------------------------------------------------------- Indications:      Aortic Stenosis (I35.0).  ------------------------------------------------------------------- History:   PMH:  SVT, Left Bundle Branch Block, Prostate Cancer, Obstructive Sleep Apnea.  Coronary artery disease.  Aortic valve disease.  Risk factors:  Family history of coronary artery disease. Hypertension. Dyslipidemia.  ------------------------------------------------------------------- Study Conclusions  - Left ventricle: The cavity size was normal. Wall thickness was   increased in a pattern of mild LVH. Systolic function was normal.   The estimated ejection fraction was in the range of 60% to 65%.   Wall motion was normal; there were no regional wall motion   abnormalities. Doppler parameters are consistent with abnormal   left ventricular relaxation (grade 1 diastolic dysfunction).   Doppler parameters are consistent with indeterminate ventricular   filling  pressure. - Aortic valve: Valve mobility was restricted. There was moderate   stenosis. Peak velocity (S): 387.9 cm/s. Mean gradient (S): 25 mm   Hg. Valve area (VTI): 1.04 cm^2. Valve area (Vmax): 1 cm^2. Valve   area (Vmean): 1.22 cm^2. - Mitral valve: There was trivial regurgitation. - Left atrium: The atrium was mildly dilated. - Right ventricle: The cavity size was normal. Wall thickness was   normal. Systolic function was normal. - Atrial septum: No defect or patent foramen ovale was  identified   by color flow Doppler. - Tricuspid valve: There was mild regurgitation. - Inferior vena cava: The vessel was normal in size. The   respirophasic diameter changes were in the normal range (= 50%),   consistent with normal central venous pressure.  Transthoracic echocardiography.  M-mode, complete 2D, spectral Doppler, and color Doppler.  Birthdate:  Patient birthdate: 1929/08/04.  Age:  Patient is 83 yr old.  Sex:  Gender: male. BMI: 20 kg/m^2.  Blood pressure:     160/66  Patient status: Outpatient.  Study date:  Study date: 10/10/2015. Study time: 02:10 PM.  Location:  [place] [facility]  -------------------------------------------------------------------  ------------------------------------------------------------------- Left ventricle:  The cavity size was normal. Wall thickness was increased in a pattern of mild LVH. Systolic function was normal. The estimated ejection fraction was in the range of 60% to 65%. Wall motion was normal; there were no regional wall motion abnormalities. Doppler parameters are consistent with abnormal left ventricular relaxation (grade 1 diastolic dysfunction). Doppler parameters are consistent with indeterminate ventricular filling pressure.  ------------------------------------------------------------------- Aortic valve:   Noncalcified annulus. Trileaflet; severely thickened, severely calcified leaflets. Valve mobility was restricted.  Doppler:   There was moderate stenosis.   There was no regurgitation.    VTI ratio of LVOT to aortic valve: 0.23. Valve area (VTI): 1.04 cm^2. Indexed valve area (VTI): 0.63 cm^2/m^2. Peak velocity ratio of LVOT to aortic valve: 0.22. Valve area (Vmax): 1 cm^2. Indexed valve area (Vmax): 0.61 cm^2/m^2. Mean velocity ratio of LVOT to aortic valve: 0.27. Valve area (Vmean): 1.22 cm^2. Indexed valve area (Vmean): 0.74 cm^2/m^2.    Mean gradient (S): 25 mm Hg. Peak gradient (S): 60 mm  Hg.  ------------------------------------------------------------------- Aorta:  Aortic root: The aortic root was normal in size.  ------------------------------------------------------------------- Mitral valve:   Structurally normal valve.   Mobility was not restricted.  Doppler:  Transvalvular velocity was within the normal range. There was no evidence for stenosis. There was trivial regurgitation.  ------------------------------------------------------------------- Left atrium:  The atrium was mildly dilated.  ------------------------------------------------------------------- Atrial septum:  No defect or patent foramen ovale was identified by color flow Doppler.  ------------------------------------------------------------------- Right ventricle:  The cavity size was normal. Wall thickness was normal. Systolic function was normal.  ------------------------------------------------------------------- Pulmonic valve:    Structurally normal valve.   Cusp separation was normal.  Doppler:  Transvalvular velocity was within the normal range. There was no evidence for stenosis. There was no regurgitation.  ------------------------------------------------------------------- Tricuspid valve:   Structurally normal valve.    Doppler: Transvalvular velocity was within the normal range. There was mild regurgitation.  ------------------------------------------------------------------- Pulmonary artery:   The main pulmonary artery was normal-sized. Systolic pressure was within the normal range.  ------------------------------------------------------------------- Right atrium:  The atrium was normal in size.  ------------------------------------------------------------------- Pericardium:  There was no pericardial effusion.  ------------------------------------------------------------------- Systemic veins: Inferior vena cava: The vessel was normal in size.  The respirophasic diameter changes were in the normal range (= 50%), consistent with normal central venous  pressure. Diameter: 19.2 mm.  ------------------------------------------------------------------- Measurements   IVC                                       Value          Reference  ID                                        19.2  mm       ---------    Left ventricle                            Value          Reference  LV ID, ED, PLAX chordal           (L)     39.9  mm       43 - 52  LV ID, ES, PLAX chordal                   23.6  mm       23 - 38  LV fx shortening, PLAX chordal            41    %        >=29  LV PW thickness, ED                       10.5  mm       ---------  IVS/LV PW ratio, ED                       0.86           <=1.3  Stroke volume, 2D                         93    ml       ---------  Stroke volume/bsa, 2D                     56    ml/m^2   ---------  LV ejection fraction, 1-p A4C             61    %        ---------  LV end-diastolic volume, 2-p              101   ml       ---------  LV end-systolic volume, 2-p               46    ml       ---------  LV ejection fraction, 2-p                 54    %        ---------  Stroke volume, 2-p                        55    ml       ---------  LV end-diastolic volume/bsa, 2-p          61    ml/m^2   ---------  LV end-systolic volume/bsa, 2-p  28    ml/m^2   ---------  Stroke volume/bsa, 2-p                    33.3  ml/m^2   ---------  LV e&', lateral                            6.27  cm/s     ---------  LV E/e&', lateral                          9.6            ---------  LV s&', lateral                            14.7  cm/s     ---------  LV e&', medial                             6.36  cm/s     ---------  LV E/e&', medial                           9.47           ---------  LV e&', average                            6.32  cm/s     ---------  LV E/e&', average                          9.53            ---------  LV ejection time                          400   ms       ---------    Ventricular septum                        Value          Reference  IVS thickness, ED                         9.05  mm       ---------    LVOT                                      Value          Reference  LVOT ID, S                                24    mm       ---------  LVOT area                                 4.52  cm^2     ---------  LVOT peak velocity, S  87.1  cm/s     ---------  LVOT mean velocity, S                     63    cm/s     ---------  LVOT VTI, S                               20.6  cm       ---------  LVOT peak gradient, S                     3     mm Hg    ---------    Aortic valve                              Value          Reference  Aortic valve peak velocity, S             387.9 cm/s     ---------  Aortic valve mean velocity, S             234   cm/s     ---------  Aortic valve VTI, S                       87.74 cm       ---------  Aortic mean gradient, S                   25    mm Hg    ---------  Aortic peak gradient, S                   60    mm Hg    ---------  VTI ratio, LVOT/AV                        0.23           ---------  Aortic valve area, VTI                    1.04  cm^2     ---------  Aortic valve area/bsa, VTI                0.63  cm^2/m^2 ---------  Velocity ratio, peak, LVOT/AV             0.22           ---------  Aortic valve area, peak velocity          1     cm^2     ---------  Aortic valve area/bsa, peak               0.61  cm^2/m^2 ---------  velocity  Velocity ratio, mean, LVOT/AV             0.27           ---------  Aortic valve area, mean velocity          1.22  cm^2     ---------  Aortic valve area/bsa, mean               0.74  cm^2/m^2 ---------  velocity    Aorta  Value          Reference  Aortic root ID, ED                        28    mm       ---------    Left atrium                                Value          Reference  LA ID, A-P, ES                            36    mm       ---------  LA ID/bsa, A-P                            2.18  cm/m^2   <=2.2  LA volume, S                              53    ml       ---------  LA volume/bsa, S                          32.1  ml/m^2   ---------  LA volume, ES, 1-p A4C                    35    ml       ---------  LA volume/bsa, ES, 1-p A4C                21.2  ml/m^2   ---------  LA volume, ES, 1-p A2C                    72    ml       ---------  LA volume/bsa, ES, 1-p A2C                43.6  ml/m^2   ---------    Mitral valve                              Value          Reference  Mitral E-wave peak velocity               60.2  cm/s     ---------  Mitral A-wave peak velocity               81.9  cm/s     ---------  Mitral deceleration time          (H)     380   ms       150 - 230  Mitral E/A ratio, peak                    0.7            ---------    Tricuspid valve                           Value          Reference  Tricuspid regurg peak  velocity            314   cm/s     ---------  Tricuspid peak RV-RA gradient             39    mm Hg    ---------    Right ventricle                           Value          Reference  RV s&', lateral, S                         14.7  cm/s     ---------  Legend: (L)  and  (H)  mark values outside specified reference range.  ------------------------------------------------------------------- Prepared and Electronically Authenticated by  Skeet Latch, MD 2017-05-10T18:52:25  Belva Crome, MD (Primary)    Procedures   Right/Left Heart Cath and Coronary Angiography  Conclusion     Prox RCA to Mid RCA lesion, 20 %stenosed.  Mid RCA lesion, 80 %stenosed.  Ost 1st Mrg to 1st Mrg lesion, 80 %stenosed.  Mid Cx to Dist Cx lesion, 70 %stenosed.  Prox LAD to Dist LAD lesion, 40 %stenosed.  The left ventricular systolic function is normal.  LV end diastolic pressure is normal.  The  left ventricular ejection fraction is 55-65% by visual estimate.  LV end diastolic pressure is normal.    There is three-vessel coronary calcification.  80% eccentric mid RCA stenosis.  80% ostial obtuse marginal #1 stenosis.  Moderate diffuse calcified LAD disease less than 50%.  Normal left ventricular systolic function and hemodynamics  Normal pulmonary pressures and pulmonary capillary wedge.  Moderate to severe aortic stenosis with aortic valve area 1.04.   RECOMMENDATIONS:   Anti-ischemic therapy to potentially include beta blocker therapy, antiplatelet therapy, and statin therapy.  Intervention on both the right coronary and obtuse marginal stenosis would be higher than usual risk due to heavy calcification and vessel tortuosity. Initial inclination is medical therapy in the absence of clear anginal symptoms.  Fatigue even at rest is of uncertain etiology. Exclude other possibilities including depression, although on one systemic illnesses.   Indications   Fatigue due to excessive exertion, initial encounter [T73.3XXA (ICD-10-CM)]  SOB (shortness of breath) on exertion [R06.02 (ICD-10-CM)]  Coronary artery disease involving native coronary artery of native heart without angina pectoris [I25.10 (ICD-10-CM)]  Nonrheumatic aortic valve stenosis [I35.0 (ICD-10-CM)]  Procedural Details/Technique   Technical Details INDICATION: Vague complaints of exertional fatigue and dyspnea. No chest pain. History of moderate aortic valve disease and prior documentation of nonobstructive coronary disease.  An 18-gauge IV was started in the right antecubital vein. After 1% Xylocaine local infiltration a 5 French radial sheath was then advanced into the vein. Right heart catheterization was performed with a 5 French Swan-Ganz catheter. Mixed venous main pulmonary artery oxygen saturation was obtained. The catheter was removed.  The right radial area was sterilely prepped and draped.  Intravenous sedation with Versed and fentanyl was administered. 1% Xylocaine was infiltrated to achieve local analgesia. A double wall stick with an angiocath was utilized to obtain intra-arterial access. The modified Seldinger technique was used to place a 61F " Slender" sheath in the right radial artery. Weight based heparin was administered. Coronary angiography was done using 5 F catheters. Right coronary angiography was performed with a JR4. Left ventricular hemodymic recordings and angiography was done using the  JR 4 catheter and hand injection. Left coronary angiography was performed with a JL 3.5 cm.  Hemostasis was achieved using a pneumatic band.  During this procedure the patient is administered a total of Versed 1 mg and Fentanyl 50 mg to achieve and maintain moderate conscious sedation. The patient's heart rate, blood pressure, and oxygen saturation are monitored continuously during the procedure. The period of conscious sedation is 52 minutes, of which I was present face-to-face 100% of this time.   Estimated blood loss <50 mL. . During this procedure the patient was administered the following to achieve and maintain moderate conscious sedation: Versed 1 mg, Fentanyl 50 mcg, while the patient's heart rate, blood pressure, and oxygen saturation were continuously monitored. The period of conscious sedation was 52 minutes, of which I was present face-to-face 100% of this time.    Complications   Complications documented before study signed (03/12/2016 4:33 PM EDT)    No complications were associated with this study.  Documented by Belva Crome, MD - 01/17/2016 9:43 AM EDT    Coronary Findings   Dominance: Right  Left Anterior Descending  Prox LAD to Dist LAD lesion, 40% stenosed. The lesion is type C and concentric. The lesion is calcified.  Left Circumflex  Mid Cx to Dist Cx lesion, 70% stenosed. The lesion is type C. The lesion is calcified.  First Obtuse Marginal Branch  Ost 1st  Mrg to 1st Mrg lesion, 80% stenosed. The lesion is type C.  Right Coronary Artery  Prox RCA to Mid RCA lesion, 20% stenosed. The lesion is severely calcified.  Mid RCA lesion, 80% stenosed. The lesion is eccentric. The lesion is severely calcified.  Right Heart   Right Heart Pressures LV EDP is normal.    Right Atrium Right atrial pressure is normal.    Wall Motion   Resting               Left Heart   Left Ventricle The left ventricular size is normal. The left ventricular systolic function is normal. LV end diastolic pressure is normal. The left ventricular ejection fraction is 55-65% by visual estimate. No regional wall motion abnormalities.    Coronary Diagrams   Diagnostic Diagram     Implants     No implant documentation for this case.  PACS Images   Show images for Cardiac catheterization   Link to Procedure Log   Procedure Log    Hemo Data   Flowsheet Row Most Recent Value  Fick Cardiac Output 4.39 L/min  Fick Cardiac Output Index 2.6 (L/min)/BSA  Aortic Mean Gradient 25.1 mmHg  Aortic Peak Gradient 26 mmHg  Aortic Valve Area 1.04  Aortic Value Area Index 0.62 cm2/BSA  RA A Wave 4 mmHg  RA V Wave 4 mmHg  RA Mean 2 mmHg  RV Systolic Pressure 34 mmHg  RV Diastolic Pressure -1 mmHg  RV EDP 6 mmHg  PA Systolic Pressure 32 mmHg  PA Diastolic Pressure 6 mmHg  PA Mean 14 mmHg  PW A Wave 5 mmHg  PW V Wave 4 mmHg  PW Mean 4 mmHg  AO Systolic Pressure 269 mmHg  AO Diastolic Pressure 54 mmHg  AO Mean 81 mmHg  LV Systolic Pressure 485 mmHg  LV Diastolic Pressure 5 mmHg  LV EDP 11 mmHg  Arterial Occlusion Pressure Extended Systolic Pressure 462 mmHg  Arterial Occlusion Pressure Extended Diastolic Pressure 49 mmHg  Arterial Occlusion Pressure Extended Mean Pressure 75 mmHg  Left Ventricular Apex Extended Systolic  Pressure 153 mmHg  Left Ventricular Apex Extended Diastolic Pressure 5 mmHg  Left Ventricular Apex Extended EDP Pressure 10 mmHg  QP/QS 1   TPVR Index 5.78 HRUI  TSVR Index 31.21 HRUI  PVR SVR Ratio 0.14  TPVR/TSVR Ratio 0.19    BURLIE CAJAMARCA  Cardiac catheterization  Order# [id number]  Reading physician: Sherren Mocha, MD Ordering physician: Sherren Mocha, MD Study date: 04/30/16  Physicians   Panel Physicians Referring Physician Case Authorizing Physician  Sherren Mocha, MD (Primary)    Procedures   Coronary/Graft Atherectomy  Conclusion   Successful rotational atherectomy and stenting of the right coronary artery with a 3.0 x 12 mm Synergy DES.  Recommendations: Triple therapy with aspirin 81 mg, Plavix 75 mg, and apixaban 30 days, then discontinue aspirin. Plans for TAVR in approximately 3-4 weeks.  Indications   Exertional dyspnea [R06.09 (ICD-10-CM)]  Procedural Details/Technique   Technical Details INDICATION: 83 year old gentleman with coronary artery disease. He also has atrial fibrillation and severe aortic stenosis with plans for TAVR. He underwent permanent pacemaker placement last month for treatment of tachybradycardia syndrome. He presents for PCI of the right coronary artery which has severe calcific stenosis in its midportion. Plans are to perform PCI prior to TAVR for definitive treatment of his aortic stenosis. The patient has exertional dyspnea with class III symptoms.  PROCEDURAL DETAILS:  The right groin was prepped, draped, and anesthetized with 1% lidocaine. Using modified Seldinger technique, a 7 French sheath was introduced into the right femoral artery. A 7 [place] AL-1 guide catheter was used. Initially, a 300 cm cougar wire was advanced down the right coronary artery with plans to transited out for a roto-floppy wire. A 2.5 mm over-the-wire balloon was advanced into the distal RCA without difficulty. However, I was unable to advance the roto-wire through the balloon because of a proximal band. The cougar wire was repositioned and the balloon was removed. A Rotafloppy wire was then used  to cross the lesion. The cougar wire was removed. A 1.5 mm bur was used to perform rotational atherectomy on the lesion. A total of 3 runs were made. The patient tolerated this well. The Rotablator bur was then removed and a cougar wire was again advanced into the distal RCA. The lesion was dilated with a 2.5 mm balloon, stented with a 3.0 x 12 mm Synergy DES, and the stent was postdilated with a 3.25 mm noncompliant balloon to 16 atm. The patient tolerated the entire procedure well. There was TIMI-3 flow and 0% residual stenosis at the completion of the case. The femoral arteriotomy was closed with a Perclose device. The patient has a high femoral artery bifurcation and the Perclose device was deployed at the level of the bifurcation. There were no immediate procedural complications. The patient was transferred to the post catheterization recovery area for further monitoring.    Estimated blood loss <50 mL.  During this procedure the patient was administered the following to achieve and maintain moderate conscious sedation: Versed 3 mg, Fentanyl 75 mcg, while the patient's heart rate, blood pressure, and oxygen saturation were continuously monitored. The period of conscious sedation was 73 minutes, of which I was present face-to-face 100% of this time.    Coronary Findings   Dominance: Right  Left Anterior Descending  Prox LAD to Dist LAD lesion, 40% stenosed. The lesion is type C and concentric. The lesion is calcified.  Left Circumflex  Mid Cx to Dist Cx lesion, 70% stenosed. The lesion is type C. The  lesion is calcified.  First Obtuse Marginal Branch  Ost 1st Mrg to 1st Mrg lesion, 80% stenosed. The lesion is type C.  Right Coronary Artery  Prox RCA to Mid RCA lesion, 20% stenosed. The lesion is severely calcified.  Mid RCA lesion, 90% stenosed. The lesion is eccentric. The lesion is severely calcified.  Angioplasty: Lesion crossed with guidewire. Pre-stent angioplasty was performed. A STENT  SYNERGY DES 3X12 drug eluting stent was successfully placed. Post-stent angioplasty was performed. The pre-interventional distal flow is normal (TIMI 3). The post-interventional distal flow is normal (TIMI 3). The intervention was successful . No complications occurred at this lesion.  Atherectomy: Rotational atherectomy was performed using a CATH ROTALINK PLUS 1.50MM. Atherectomy device crossed the lesion. The intervention was successful.  There is no residual stenosis post intervention.    ADDENDUM REPORT: 03/19/2016 17:46  CLINICAL DATA:  83 year old male with severe aortic stenosis.  EXAM: Cardiac TAVR CT  TECHNIQUE: The patient was scanned on a Philips 256 scanner. A 120 kV retrospective scan was triggered in the descending thoracic aorta at 111 HU's. Gantry rotation speed was 270 msecs and collimation was .9 mm. 10 mg of iv Metoprolol and no nitro were given. The 3D data set was reconstructed in 5% intervals of the R-R cycle. Systolic and diastolic phases were analyzed on a dedicated work station using MPR, MIP and VRT modes. The patient received 80 cc of contrast.  FINDINGS: Aortic Valve: Trileaflet, severely thickened and calcified with severely restricted leaflet opening. There is minimal calcification extending into the LVOT.  Aorta:  Normal size, moderate diffuse calcifications, no dissection.  Sinotubular Junction:  31 x 31 mm  Ascending Thoracic Aorta:  35 x 34 mm  Aortic Arch:  25 x 25 mm  Descending Thoracic Aorta:  23 x 23 mm  Sinus of Valsalva Measurements:  Non-coronary:  35 mm  Right -coronary:  36 mm  Left -coronary:  36 mm  Coronary Artery Height above Annulus:  Left Main:  11 mm  Right Coronary:  12 mm  Virtual Basal Annulus Measurements:  Maximum/Minimum Diameter:  27 x 26 mm  Perimeter:  82 mm  Area:  504 mm2  Optimum Fluoroscopic Angle for Delivery:  LAO 8 CAU 7  Other findings:  A large left atrial  appendage without evidence of a thrombus.  Normal pulmonary vein drainage into the left atrium.  Dilated pulmonary artery measuring 34 x 25 mm.  IMPRESSION: 1. Trileaflet, severely thickened and calcified aortic valve with severely restricted leaflet opening and annular measurements suitable for delivery of a 26 mm Edwards-SAPIEN 3 valve.  2. Sufficient annulus to coronary distance.  3. Optimum Fluoroscopic Angle for Delivery:  LAO 8 CAU 7.  4. A large left atrial appendage without evidence of a thrombus  5. Dilated pulmonary artery measuring 34 x 25 mm  Ena Dawley   Electronically Signed   By: Ena Dawley   On: 03/19/2016 17:46   CLINICAL DATA:  83 year old male with history of severe aortic stenosis. Preprocedural study prior to potential transcatheter aortic valve replacement (TAVR). Chest tightness and sensation of racing heart beat. Fatigue yesterday.  EXAM: CT ANGIOGRAPHY CHEST, ABDOMEN AND PELVIS  TECHNIQUE: Multidetector CT imaging through the chest, abdomen and pelvis was performed using the standard protocol during bolus administration of intravenous contrast. Multiplanar reconstructed images and MIPs were obtained and reviewed to evaluate the vascular anatomy.  CONTRAST:  70 mL of Isovue 370.  COMPARISON:  No priors.  FINDINGS: CTA CHEST FINDINGS  Cardiovascular: Heart size is mildly enlarged. There is no significant pericardial fluid, thickening or pericardial calcification. There is aortic atherosclerosis, as well as atherosclerosis of the great vessels of the mediastinum and the coronary arteries, including calcified atherosclerotic plaque in the left main, left anterior descending, left circumflex and right coronary arteries. Severe thickening calcification of the aortic valve.  Mediastinum/Lymph Nodes: No pathologically enlarged mediastinal or hilar lymph nodes. Densely calcified right hilar lymph node incidentally  noted. Esophagus is unremarkable in appearance. No axillary lymphadenopathy.  Lungs/Pleura: There are several pulmonary nodules scattered throughout the lungs bilaterally, the largest of which measures up to 8 x 6 mm (mean diameter of 7 mm) in the subpleural aspect of the right lower lobe (image 41 of series 407). These are nonspecific, but most of these are subpleural in location, likely to represent subpleural lymph nodes. Areas of scarring and/or subsegmental atelectasis are noted in the lung bases bilaterally, particularly in the right lower lobe. No acute consolidative airspace disease. No pleural effusions. Small right-sided Bochdalek's hernia incidentally noted.  Musculoskeletal/Soft Tissues: There are no aggressive appearing lytic or blastic lesions noted in the visualized portions of the skeleton.  CTA ABDOMEN AND PELVIS FINDINGS  Hepatobiliary: Several sub cm well-defined low-attenuation lesions are noted in the liver, largest of which measures up to 9 mm in segment 4A. These are too small to definitively characterize, but are favored to represent tiny cysts. No other definite suspicious appearing hepatic lesions are noted. There is no intra or extrahepatic biliary ductal dilatation. Gallbladder is normal in appearance.  Pancreas: No pancreatic mass. No pancreatic ductal dilatation. No pancreatic or peripancreatic fluid or inflammatory changes.  Spleen: Unremarkable.  Adrenals/Urinary Tract: Sub cm low-attenuation lesions in the left kidney are too small to definitively characterize, but are statistically likely tiny cysts. Right kidney and bilateral adrenal glands are normal in appearance. No hydroureteronephrosis. Urinary bladder is normal in appearance.  Stomach/Bowel: The appearance of the stomach is normal. There is no pathologic dilatation of small bowel or colon. The appendix is not confidently identified and may be surgically absent.  Regardless, there are no inflammatory changes noted adjacent to the cecum to suggest the presence of an acute appendicitis at this time.  Vascular/Lymphatic: Vascular findings and measurements pertinent to potential TAVR procedure, as detailed below. No aneurysm or dissection identified in the abdominal or pelvic vasculature. The celiac axis, superior mesenteric artery and inferior mesenteric artery all appear widely patent, without hemodynamically significant stenosis. There are single renal arteries bilaterally which are both widely patent. There is complete inclusion of the right internal iliac artery proximally, which is subsequently reconstituted distally from collateral pathways. No lymphadenopathy noted in the abdomen or pelvis.  Reproductive: Fiducial markers associated with the prostate gland. Prostate gland and seminal vesicles are otherwise unremarkable in appearance.  Other: Low-attenuation material in the right inguinal region, presumably a Prolene plug from prior herniorrhaphy. No significant volume of ascites. No pneumoperitoneum.  Musculoskeletal: Bilateral pars defects at L5 with 14 mm of anterolisthesis of L5 upon S1. There are no aggressive appearing lytic or blastic lesions noted in the visualized portions of the skeleton.  VASCULAR MEASUREMENTS PERTINENT TO TAVR:  AORTA:  Minimal Aortic Diameter -  13 x 14 mm  Severity of Aortic Calcification -  moderate  RIGHT PELVIS:  Right Common Iliac Artery -  Minimal Diameter - 7.7 x 6.4 mm  Tortuosity - mild  Calcification - moderate  Right External Iliac Artery -  Minimal Diameter - 7.2 x 7.1  mm  Tortuosity - severe  Calcification - none  Right Common Femoral Artery -  Minimal Diameter - 6.9 x 8.0 mm  Tortuosity - mild  Calcification - none  LEFT PELVIS:  Left Common Iliac Artery -  Minimal Diameter - 6.1 x 7.3 mm  Tortuosity - moderate  Calcification -  mild  Left External Iliac Artery -  Minimal Diameter - 6.8 x 6.9 mm  Tortuosity - severe  Calcification - none  Left Common Femoral Artery -  Minimal Diameter - 6.5 x 7.9 mm  Tortuosity - mild  Calcification - mild  Review of the MIP images confirms the above findings.  IMPRESSION: 1. Vascular findings and measurements pertinent to potential TAVR procedure, as above. This patient appears to have suitable pelvic arterial access bilaterally from a size perspective, although there is extensive tortuosity bilaterally, particularly in the external iliac arteries, as detailed above. 2. Severe thickening calcification of the aortic valve, compatible with the clinical history of severe aortic stenosis. 3. Aortic atherosclerosis, in addition to left main and 3 vessel coronary artery disease. 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 4. Mild cardiomegaly. 5. Multiple small pulmonary nodules scattered throughout the lungs bilaterally, measuring 7 mm or less in mean diameter. Many of these are subpleural in location, and are therefore favored to represent benign subpleural lymph nodes, however, these are all considered nonspecific. Non-contrast chest CT at 3-6 months is recommended. If the nodules are stable at time of repeat CT, then future CT at 18-24 months (from today's scan) is considered optional for low-risk patients, but is recommended for high-risk patients. This recommendation follows the consensus statement: Guidelines for Management of Incidental Pulmonary Nodules Detected on CT Images: From the Fleischner Society 2017; Radiology 2017; 284:228-243. 6. Additional incidental findings, as above.   Electronically Signed   By: Vinnie Langton M.D.   On: 03/20/2016 10:19  Procedure                                           AVR + CABG  Risk of Mortality                                5.7% Morbidity or Mortality                       29.1% Prolonged LOS                                   16.4% Short LOS                                           16.3% Permanent Stroke                             3.6% Prolonged Vent Support                     17.2% DSW Infection  0.3% Renal Failure                                       8.5% Reoperation                                        12.6%   Impression:  This 83 year old gentleman has stage D severe, symptomatic aortic stenosis and multivessel CAD s/p atherectomy and DES of the mid RCA 0n 04/30/2016. His chest discomfort has resolved but he continues to have exertional shortness of breath and fatigue with dizziness at low levels of activity, NYHA class III. I have personally reviewed his echo, cath and CT studies. His mean aortic valve gradient was only 25 by echo last May but the DI was 0.22 and his valve was severely calcified with restricted leaflet mobility and looked like a severely stenotic valve. His mean gradient by cath was 25.1 with an AVA of 1.04. I think he has low gradient, normal LVEF, severe AS and AVR is indicated. His cath shows 3-vessel CAD but the most concerning stenosis in the RCA has been treated with PCI and the other lesions can be managed medically for now. He is in the intermediate risk category for open surgical AVR and I think TAVR is a reasonable alternative treatment for him, especially given his age and degenerative arthritis that would make recovery from open surgery much slower.   His gated cardiac CT shows anatomy favorable for TAVR using a Sapien 3 valve. His abdominal and pelvic CT shows adequate pelvic arterial anatomy for transfemoral access. His chest CT shows multiple small pulmonary nodules scattered throughout both lungs measuring up to 7 mm or less that are likely benign but a follow up CT in 6 months is  recommended and if they are stable then I don't think any further follow up is indicated since he is a life-long non-smoker.  The patient and his wife were counseled at length regarding treatment alternatives for management of severe symptomatic aortic stenosis. Alternative approaches such as conventional aortic valve replacement, transcatheter aortic valve replacement, and palliative medical therapy were compared and contrasted at length. The risks associated with conventional surgical aortic valve replacement were been discussed in detail, as were expectations for post-operative convalescence. Long-term prognosis with medical therapy was discussed. I discussed transcatheter aortic valve replacement, what types of management strategies would be attempted intraoperatively in the event of life-threatening complications, including whether or not the patient would be considered a candidate for the use of cardiopulmonary bypass and/or conversion to open sternotomy for attempted surgical intervention.  The patient has been advised of a variety of complications that might develop including but not limited to risks of death, stroke, paravalvular leak, aortic dissection or other major vascular complications, aortic annulus rupture, device embolization, cardiac rupture or perforation, mitral regurgitation, acute myocardial infarction, arrhythmia, heart block or bradycardia requiring permanent pacemaker placement, congestive heart failure, respiratory failure, renal failure, pneumonia, infection, other late complications related to structural valve deterioration or migration, or other complications that might ultimately cause a temporary or permanent loss of functional independence or other long term morbidity.  The patient provides full informed consent for the procedure as described and all questions were answered.    Plan:  Transfemoral TAVR on Tuesday 06/10/2016  I spent 60 minutes  performing this consultation and  > 50% of this time was spent face to face counseling and coordinating the care of this patient's severe aortic stenosis.  Gaye Pollack, MD 06/04/2016

## 2016-06-06 ENCOUNTER — Encounter (HOSPITAL_COMMUNITY): Payer: Self-pay

## 2016-06-06 ENCOUNTER — Ambulatory Visit (HOSPITAL_COMMUNITY)
Admission: RE | Admit: 2016-06-06 | Discharge: 2016-06-06 | Disposition: A | Discharge status: Home / Self Care | Payer: Medicare Other | Source: Ambulatory Visit | Attending: Cardiovascular Disease | Admitting: Cardiovascular Disease

## 2016-06-06 ENCOUNTER — Encounter (HOSPITAL_COMMUNITY)
Admission: RE | Admit: 2016-06-06 | Discharge: 2016-06-06 | Disposition: A | Discharge status: Home / Self Care | Payer: Medicare Other | Source: Ambulatory Visit | Attending: Cardiovascular Disease | Admitting: Cardiovascular Disease

## 2016-06-06 DIAGNOSIS — R9431 Abnormal electrocardiogram [ECG] [EKG]: Secondary | ICD-10-CM | POA: Insufficient documentation

## 2016-06-06 DIAGNOSIS — I350 Nonrheumatic aortic (valve) stenosis: Secondary | ICD-10-CM | POA: Diagnosis not present

## 2016-06-06 DIAGNOSIS — I700 Atherosclerosis of aorta: Secondary | ICD-10-CM | POA: Insufficient documentation

## 2016-06-06 DIAGNOSIS — I447 Left bundle-branch block, unspecified: Secondary | ICD-10-CM | POA: Insufficient documentation

## 2016-06-06 DIAGNOSIS — Z01818 Encounter for other preprocedural examination: Secondary | ICD-10-CM | POA: Diagnosis not present

## 2016-06-06 DIAGNOSIS — Z01810 Encounter for preprocedural cardiovascular examination: Secondary | ICD-10-CM | POA: Insufficient documentation

## 2016-06-06 HISTORY — DX: Personal history of other diseases of the digestive system: Z87.19

## 2016-06-06 LAB — CBC
HEMATOCRIT: 40.1 % (ref 39.0–52.0)
Hemoglobin: 13.3 g/dL (ref 13.0–17.0)
MCH: 29.4 pg (ref 26.0–34.0)
MCHC: 33.2 g/dL (ref 30.0–36.0)
MCV: 88.7 fL (ref 78.0–100.0)
Platelets: 242 10*3/uL (ref 150–400)
RBC: 4.52 MIL/uL (ref 4.22–5.81)
RDW: 13.6 % (ref 11.5–15.5)
WBC: 4.5 10*3/uL (ref 4.0–10.5)

## 2016-06-06 LAB — COMPREHENSIVE METABOLIC PANEL
ALT: 32 U/L (ref 17–63)
AST: 31 U/L (ref 15–41)
Albumin: 4.1 g/dL (ref 3.5–5.0)
Alkaline Phosphatase: 70 U/L (ref 38–126)
Anion gap: 11 (ref 5–15)
BILIRUBIN TOTAL: 0.5 mg/dL (ref 0.3–1.2)
BUN: 21 mg/dL — AB (ref 6–20)
CO2: 20 mmol/L — ABNORMAL LOW (ref 22–32)
CREATININE: 1.1 mg/dL (ref 0.61–1.24)
Calcium: 9.5 mg/dL (ref 8.9–10.3)
Chloride: 106 mmol/L (ref 101–111)
GFR, EST NON AFRICAN AMERICAN: 60 mL/min — AB (ref >60)
Glucose, Bld: 102 mg/dL — ABNORMAL HIGH (ref 65–99)
POTASSIUM: 4.2 mmol/L (ref 3.5–5.1)
Sodium: 137 mmol/L (ref 135–145)
TOTAL PROTEIN: 6.6 g/dL (ref 6.5–8.1)

## 2016-06-06 LAB — URINALYSIS, ROUTINE W REFLEX MICROSCOPIC
BILIRUBIN URINE: NEGATIVE
GLUCOSE, UA: NEGATIVE mg/dL
Hgb urine dipstick: NEGATIVE
Ketones, ur: NEGATIVE mg/dL
Leukocytes, UA: NEGATIVE
Nitrite: NEGATIVE
PH: 6 (ref 5.0–8.0)
Protein, ur: NEGATIVE mg/dL
Specific Gravity, Urine: 1.009 (ref 1.005–1.030)

## 2016-06-06 LAB — SURGICAL PCR SCREEN
MRSA, PCR: NEGATIVE
Staphylococcus aureus: NEGATIVE

## 2016-06-06 LAB — TYPE AND SCREEN
ABO/RH(D): O POS
ANTIBODY SCREEN: NEGATIVE

## 2016-06-06 LAB — PROTIME-INR
INR: 1.07
PROTHROMBIN TIME: 14 s (ref 11.4–15.2)

## 2016-06-06 LAB — APTT: aPTT: 32 seconds (ref 24–36)

## 2016-06-06 LAB — ABO/RH: ABO/RH(D): O POS

## 2016-06-06 NOTE — Progress Notes (Signed)
Device  Form faxed to Dr. Crissie Sickles. Notified Tomi Bamberger @ [employer] of surgery date and time.   Pt. Stated he has stopped aspirin (12/29) and eliquis (1/3) Per Dr. Cyndia Bent. States to continue plavix.

## 2016-06-07 LAB — HEMOGLOBIN A1C
HEMOGLOBIN A1C: 5.5 % (ref 4.8–5.6)
MEAN PLASMA GLUCOSE: 111 mg/dL

## 2016-06-09 LAB — BLOOD GAS, ARTERIAL
Acid-base deficit: 0.4 mmol/L (ref 0.0–2.0)
BICARBONATE: 23 mmol/L (ref 20.0–28.0)
Drawn by: 449841
FIO2: 21
O2 Saturation: 96.4 %
PCO2 ART: 32.5 mmHg (ref 32.0–48.0)
PH ART: 7.463 — AB (ref 7.350–7.450)
Patient temperature: 98.6
pO2, Arterial: 85.6 mmHg (ref 83.0–108.0)

## 2016-06-09 MED ORDER — NITROGLYCERIN IN D5W 200-5 MCG/ML-% IV SOLN
2.0000 ug/min | INTRAVENOUS | Status: DC
  Filled 2016-06-09: qty 250

## 2016-06-09 MED ORDER — DOPAMINE-DEXTROSE 3.2-5 MG/ML-% IV SOLN
0.0000 ug/kg/min | INTRAVENOUS | Status: DC
  Filled 2016-06-09: qty 250

## 2016-06-09 MED ORDER — VANCOMYCIN HCL 10 G IV SOLR
1250.0000 mg | INTRAVENOUS | Status: AC
  Administered 2016-06-10: 1250 mg via INTRAVENOUS
  Filled 2016-06-09: qty 1250

## 2016-06-09 MED ORDER — DEXTROSE 5 % IV SOLN
0.0000 ug/min | INTRAVENOUS | Status: DC
  Filled 2016-06-09: qty 4

## 2016-06-09 MED ORDER — SODIUM CHLORIDE 0.9 % IV SOLN
INTRAVENOUS | Status: DC
  Filled 2016-06-09: qty 2.5

## 2016-06-09 MED ORDER — MAGNESIUM SULFATE 50 % IJ SOLN
40.0000 meq | INTRAMUSCULAR | Status: DC
  Filled 2016-06-09: qty 10

## 2016-06-09 MED ORDER — NOREPINEPHRINE BITARTRATE 1 MG/ML IV SOLN
0.0000 ug/min | INTRAVENOUS | Status: DC
  Filled 2016-06-09: qty 4

## 2016-06-09 MED ORDER — PHENYLEPHRINE HCL 10 MG/ML IJ SOLN
30.0000 ug/min | INTRAVENOUS | Status: DC
  Filled 2016-06-09: qty 2

## 2016-06-09 MED ORDER — POTASSIUM CHLORIDE 2 MEQ/ML IV SOLN
80.0000 meq | INTRAVENOUS | Status: DC
  Filled 2016-06-09: qty 40

## 2016-06-09 MED ORDER — DEXMEDETOMIDINE HCL IN NACL 400 MCG/100ML IV SOLN
0.1000 ug/kg/h | INTRAVENOUS | Status: AC
  Administered 2016-06-10: .4 ug/kg/h via INTRAVENOUS
  Filled 2016-06-09: qty 100

## 2016-06-09 MED ORDER — CHLORHEXIDINE GLUCONATE 0.12 % MT SOLN
15.0000 mL | Freq: Once | OROMUCOSAL | Status: AC
  Administered 2016-06-10: 15 mL via OROMUCOSAL
  Filled 2016-06-09: qty 15

## 2016-06-09 MED ORDER — SODIUM CHLORIDE 0.9 % IV SOLN: INTRAVENOUS | Status: DC

## 2016-06-09 MED ORDER — DEXTROSE 5 % IV SOLN
1.5000 g | INTRAVENOUS | Status: AC
  Administered 2016-06-10: 1.5 g via INTRAVENOUS
  Filled 2016-06-09: qty 1.5

## 2016-06-09 MED ORDER — SODIUM CHLORIDE 0.9 % IV SOLN
INTRAVENOUS | Status: DC
  Filled 2016-06-09: qty 30

## 2016-06-09 NOTE — H&P (Signed)
[address]       [place] [address]             [phone]      Cardiothoracic Surgery Admission History and Physical   Referring Provider is Dorothy Spark, MD PCP is Velna Hatchet, MD      Chief Complaint  Patient presents with  . Aortic Stenosis        HPI:  The patient is an 83 year old gentleman with history of aortic stenosis, coronary artery disease, hypertension, cerebrovascular disease, peripheral vascular disease, and hyperlipidemia who was evaluated in August 2017 for progressive symptoms of exertional shortness of breath and burning pain in his upper chest and throat. His last echo in May 2017 showed moderate AS with a mean gradient of 25 mm Hg and a peak velocity ratio of 0.22 with an LVEF of 60-65%. He underwent cath on 01/17/2016 showing a mean aortic transvalvular gradient of 25 mm Hg and a calculated AVA of 1.04 cm2. Coronary angiography revealed multivessel coronary artery disease with an eccentric 80% stenosis of the mid right coronary artery, 80% ostial stenosis of a large first obtuse marginal branch and diffuse nonobstructive disease in the LAD. Medical therapy was recommended but he continued to have progressive exertional shortness of breath and pain in the upper chest and neck. He was seen by Dr. Roxy Manns for surgical evaluation and he was concerned about the RCA stenosis and felt that pre-op PCI was indicated. The patient was subsequently admitted with new onset atrial fibrillation and tachybrady syndrome and underwent implantation of a dual chamber pacemaker by Dr. Lovena Le on 03/21/2016. He then underwent PCI with rotational atherectomy and DES of the RCA on 04/30/2016 and was started on ASA, Plavix and Eliquis with plans for staged TAVR in 4 weeks. He says that he has had no further chest discomfort but still has shortness of breath and fatigue with minimal exertion around the house. He has not been able to walk too much. He has had  some dizzy spells and feels like he is in a mental fog in the afternoon but has had no syncope.  He is married and lives with his wife. They have been physically active walking daily and exercising in a swimming pool but he has not been able to do much lately due to symptoms. His ambulation is slowed by severe degenerative arthritis of both knees and he uses a walker if he needs to go very far.       Past Medical History:  Diagnosis Date  . Age-related macular degeneration   . Allergic rhinitis   . Aortic valve stenosis   . Arthritis    "hands, lower back" (04/30/2016)  . Asthma   . Carotid artery obstruction   . Chronic lower back pain   . Coronary arteriosclerosis in native artery   . Diverticulitis of colon   . DJD (degenerative joint disease)   . Dysthymia   . GERD (gastroesophageal reflux disease)   . History of elevated PSA 05/2009  . HLD (hyperlipidemia)   . Hypertension   . Nocturnal hypoxemia   . OSA (obstructive sleep apnea)    intolerant to CPAP  . Paroxysmal supraventricular tachycardia ([place])   . Pneumonia    "when I was a kid"  . Presence of permanent cardiac pacemaker   . Primary malignant neoplasm of bladder ([place])   . Prostate cancer [facility])          Past Surgical History:  Procedure Laterality Date  . APPENDECTOMY    . CARDIAC CATHETERIZATION N/A 01/17/2016   Procedure: Right/Left Heart Cath and Coronary Angiography;  Surgeon: Belva Crome, MD;  Location: [place] [facility];  Service: Cardiovascular;  Laterality: N/A;  . CARDIAC CATHETERIZATION N/A 04/30/2016   Procedure: Coronary/Graft Atherectomy;  Surgeon: Sherren Mocha, MD;  Location: [place] [facility];  Service: Cardiovascular;  Laterality: N/A;  . CAROTID ENDARTERECTOMY Bilateral   . CATARACT EXTRACTION W/ INTRAOCULAR LENS  IMPLANT, BILATERAL Bilateral   . COLON SURGERY  1981   Meckles Diverticulum with volvulus  . EP IMPLANTABLE DEVICE N/A 03/21/2016    Procedure: Pacemaker Implant;  Surgeon: Evans Lance, MD;  Location: [place] [facility];  Service: Cardiovascular;  Laterality: N/A;  . INGUINAL HERNIA REPAIR Right 2009  . INSERTION PROSTATE RADIATION SEED    . TONSILLECTOMY  ~ 1947         Family History  Problem Relation Age of Onset  . Heart disease Mother   . Heart disease Father     Social History        Social History  . Marital status: Married    Spouse name: N/A  . Number of children: N/A  . Years of education: N/A       Occupational History  . retired          Social History Main Topics  . Smoking status: Never Smoker  . Smokeless tobacco: Never Used  . Alcohol use 0.0 oz/week     Comment: 04/30/2016 "nothing in years"  . Drug use: No  . Sexual activity: No       Other Topics Concern  . Not on file      Social History Narrative  . No narrative on file          Current Outpatient Prescriptions  Medication Sig Dispense Refill  . acetaminophen (TYLENOL) 500 MG tablet Take 500 mg by mouth every 6 (six) hours as needed for mild pain.    Marland Kitchen amiodarone (PACERONE) 200 MG tablet Take 1 tablet (200 mg total) by mouth daily. 90 tablet 3  . apixaban (ELIQUIS) 2.5 MG TABS tablet Take 1 tablet (2.5 mg total) by mouth 2 (two) times daily. 60 tablet 10  . aspirin 81 MG tablet Take 81 mg by mouth daily. Stopped prior to procedure    . atorvastatin (LIPITOR) 40 MG tablet Take 1 tablet (40 mg total) by mouth daily. (Patient taking differently: Take 40 mg by mouth daily at 6 PM. ) 30 tablet 5  . cholecalciferol (VITAMIN D) 1000 units tablet Take 1,000 Units by mouth 2 (two) times a week.     . clopidogrel (PLAVIX) 75 MG tablet Take 1 tablet (75 mg total) by mouth daily. (Patient taking differently: Take 75 mg by mouth every evening. ) 90 tablet 3  . Coenzyme Q10 (COQ-10 PO) Take 1 capsule by mouth 2 (two) times a week.     . latanoprost (XALATAN) 0.005 % ophthalmic solution Place 1 drop into  both eyes at bedtime.    Marland Kitchen lisinopril (PRINIVIL,ZESTRIL) 10 MG tablet Take 1 tablet (10 mg total) by mouth daily. 90 tablet 3  . nitroGLYCERIN (NITROSTAT) 0.4 MG SL tablet Place 1 tablet (0.4 mg total) under the tongue every 5 (five) minutes as needed. 25 tablet 3   No current facility-administered medications for this visit.          Allergies  Allergen Reactions  . Ciprofloxacin Anaphylaxis  . Hydrochlorothiazide  Anaphylaxis  . Sulfa Antibiotics Anaphylaxis    unknown  . Ace Inhibitors Cough  . Losartan Cough      Review of Systems:              General:normalappetite, decreasedenergy, noweight gain, noweight loss, nofever Cardiac:+chest pain with exertion, nochest pain at rest, + SOB with exertion, noresting SOB, noPND, noorthopnea, +palpitations, +arrhythmia, noatrial fibrillation, noLE edema, +dizzy spells, + nearsyncope Respiratory:+ exertionalshortness of breath, nohome oxygen, noproductive cough, +dry cough, nobronchitis, +wheezing, nohemoptysis, noasthma, nopain with inspiration or cough, +sleep apnea, noCPAP at night SA:YTKZSWFUXNAT swallowing, +reflux, nofrequent heartburn, nohiatal hernia, noabdominal pain, noconstipation, nodiarrhea, nohematochezia, nohematemesis, nomelena FT:DDUKGURKY, +frequency, nourinary tract infection, nohematuria, noenlarged prostate, nokidney stones, nokidney disease Vascular:nopain suggestive of claudication, nopain in feet, noleg cramps, novaricose veins, noDVT, nonon-healing foot ulcer Neuro:nostroke, noTIA's, noseizures, noheadaches, notemporary blindness one eye, noslurred speech, noperipheral neuropathy, +chronic pain, noinstability of  gait, mildmemory/cognitive dysfunction Musculoskeletal:+arthritis, +joint swelling, nomyalgias, somedifficulty walking, slightly decreasedmobility  Skin:norash, noitching, noskin infections, nopressure sores or ulcerations Psych:+anxiety, +depression, nonervousness, nounusual recent stress Eyes:+blurry vision, nofloaters, +recent vision changes, +wears glasses or contacts ENT:+hearing loss, noloose or painful teeth, nodentures, last saw dentist 2016 Hematologic:+easy bruising, noabnormal bleeding, noclotting disorder, nofrequent epistaxis Endocrine:nodiabetes, does not check CBG's at home    Physical Exam:              BP (!) 188/89 (BP Location: Right Arm, Patient Position: Sitting, Cuff Size: Large)   Pulse 71   Resp 16   Ht 5' 6"  (1.676 m)   Wt 134 lb (60.8 kg)   SpO2 99% Comment: ON RA  BMI 21.63 kg/m              General:                      Elderly, thin but well-appearing             HEENT:                       Unremarkable , NCAT, PERLA, EOMI, oropharynx clear             Neck:                           no JVD, no bruits, no adenopathy or thyromegaly             Chest:                          clear to auscultation, symmetrical breath sounds, no wheezes, no rhonchi              CV:                              RRR, grade III/VI crescendo/decrescendo murmur heard best at RSB,  no diastolic murmur             Abdomen:                    soft, non-tender, no masses or organomegaly             Extremities:                 warm, well-perfused, pulses palpable in feet, no LE edema  Rectal/GU                   Deferred             Neuro:                         Grossly non-focal and symmetrical  throughout             Skin:                            Clean and dry, no rashes, no breakdown   Diagnostic Tests:  [name] [address], [place] [address] [phone]  ------------------------------------------------------------------- Transthoracic Echocardiography  Patient: Ryan [name], Ryan [name] MR #: [id number] Study Date: 10/10/2015 Gender: M Age: 89 Height: 170.2 cm Weight: 58.1 kg BSA: 1.65 m^2 Pt. Status: Room:  ATTENDING Lyman Bishop MD Gaylene Brooks, M.D. REFERRING Ena Dawley, M.D. PERFORMING [facility], Outpatient SONOGRAPHER [facility], RDCS  cc:  ------------------------------------------------------------------- LV EF: 60% - 65%  ------------------------------------------------------------------- Indications: Aortic Stenosis (I35.0).  ------------------------------------------------------------------- History: PMH: SVT, Left Bundle Branch Block, Prostate Cancer, Obstructive Sleep Apnea. Coronary artery disease. Aortic valve disease. Risk factors: Family history of coronary artery disease. Hypertension. Dyslipidemia.  ------------------------------------------------------------------- Study Conclusions  - Left ventricle: The cavity size was normal. Wall thickness was increased in a pattern of mild LVH. Systolic function was normal. The estimated ejection fraction was in the range of 60% to 65%. Wall motion was normal; there were no regional wall motion abnormalities. Doppler parameters are consistent with abnormal left ventricular relaxation (grade 1 diastolic dysfunction). Doppler parameters are consistent with indeterminate ventricular filling pressure. - Aortic valve: Valve mobility was restricted. There was moderate stenosis. Peak velocity (S):  387.9 cm/s. Mean gradient (S): 25 mm Hg. Valve area (VTI): 1.04 cm^2. Valve area (Vmax): 1 cm^2. Valve area (Vmean): 1.22 cm^2. - Mitral valve: There was trivial regurgitation. - Left atrium: The atrium was mildly dilated. - Right ventricle: The cavity size was normal. Wall thickness was normal. Systolic function was normal. - Atrial septum: No defect or patent foramen ovale was identified by color flow Doppler. - Tricuspid valve: There was mild regurgitation. - Inferior vena cava: The vessel was normal in size. The respirophasic diameter changes were in the normal range (= 50%), consistent with normal central venous pressure.  Transthoracic echocardiography. M-mode, complete 2D, spectral Doppler, and color Doppler. Birthdate: Patient birthdate: 1930/01/20. Age: Patient is 83 yr old. Sex: Gender: male. BMI: 20 kg/m^2. Blood pressure: 160/66 Patient status: Outpatient. Study date: Study date: 10/10/2015. Study time: 02:10 PM. Location: [facility]  -------------------------------------------------------------------  ------------------------------------------------------------------- Left ventricle: The cavity size was normal. Wall thickness was increased in a pattern of mild LVH. Systolic function was normal. The estimated ejection fraction was in the range of 60% to 65%. Wall motion was normal; there were no regional wall motion abnormalities. Doppler parameters are consistent with abnormal left ventricular relaxation (grade 1 diastolic dysfunction). Doppler parameters are consistent with indeterminate ventricular filling pressure.  ------------------------------------------------------------------- Aortic valve: Noncalcified annulus. Trileaflet; severely thickened, severely calcified leaflets. Valve mobility was restricted. Doppler: There was moderate stenosis. There was no regurgitation. VTI ratio of LVOT to aortic valve: 0.23.  Valve area (VTI): 1.04 cm^2. Indexed valve area (VTI): 0.63 cm^2/m^2. Peak velocity ratio of LVOT to aortic valve: 0.22. Valve area (Vmax): 1 cm^2. Indexed valve area (Vmax): 0.61 cm^2/m^2. Mean velocity ratio of LVOT to  aortic valve: 0.27. Valve area (Vmean): 1.22 cm^2. Indexed valve area (Vmean): 0.74 cm^2/m^2. Mean gradient (S): 25 mm Hg. Peak gradient (S): 60 mm Hg.  ------------------------------------------------------------------- Aorta: Aortic root: The aortic root was normal in size.  ------------------------------------------------------------------- Mitral valve: Structurally normal valve. Mobility was not restricted. Doppler: Transvalvular velocity was within the normal range. There was no evidence for stenosis. There was trivial regurgitation.  ------------------------------------------------------------------- Left atrium: The atrium was mildly dilated.  ------------------------------------------------------------------- Atrial septum: No defect or patent foramen ovale was identified by color flow Doppler.  ------------------------------------------------------------------- Right ventricle: The cavity size was normal. Wall thickness was normal. Systolic function was normal.  ------------------------------------------------------------------- Pulmonic valve: Structurally normal valve. Cusp separation was normal. Doppler: Transvalvular velocity was within the normal range. There was no evidence for stenosis. There was no regurgitation.  ------------------------------------------------------------------- Tricuspid valve: Structurally normal valve. Doppler: Transvalvular velocity was within the normal range. There was mild regurgitation.  ------------------------------------------------------------------- Pulmonary artery: The main pulmonary artery was normal-sized. Systolic pressure was within the normal  range.  ------------------------------------------------------------------- Right atrium: The atrium was normal in size.  ------------------------------------------------------------------- Pericardium: There was no pericardial effusion.  ------------------------------------------------------------------- Systemic veins: Inferior vena cava: The vessel was normal in size. The respirophasic diameter changes were in the normal range (= 50%), consistent with normal central venous pressure. Diameter: 19.2 mm.  ------------------------------------------------------------------- Measurements  IVC Value Reference ID 19.2 mm ---------  Left ventricle Value Reference LV ID, ED, PLAX chordal (L) 39.9 mm 43 - 52 LV ID, ES, PLAX chordal 23.6 mm 23 - 38 LV fx shortening, PLAX chordal 41 % >=29 LV PW thickness, ED 10.5 mm --------- IVS/LV PW ratio, ED 0.86 <=1.3 Stroke volume, 2D 93 ml --------- Stroke volume/bsa, 2D 56 ml/m^2 --------- LV ejection fraction, 1-p A4C 61 % --------- LV end-diastolic volume, 2-p 425 ml --------- LV end-systolic volume, 2-p 46 ml --------- LV ejection fraction, 2-p 54 % --------- Stroke volume, 2-p 55 ml --------- LV end-diastolic volume/bsa, 2-p 61 ml/m^2 --------- LV end-systolic volume/bsa, 2-p 28 ml/m^2 --------- Stroke volume/bsa, 2-p 33.3 ml/m^2 --------- LV e&', lateral 6.27 cm/s --------- LV E/e&', lateral  9.6 --------- LV s&', lateral 14.7 cm/s --------- LV e&', medial 6.36 cm/s --------- LV E/e&', medial 9.47 --------- LV e&', average 6.32 cm/s --------- LV E/e&', average 9.53 --------- LV ejection time 400 ms ---------  Ventricular septum Value Reference IVS thickness, ED 9.05 mm ---------  LVOT Value Reference LVOT ID, S 24 mm --------- LVOT area 4.52 cm^2 --------- LVOT peak velocity, S 87.1 cm/s --------- LVOT mean velocity, S 63 cm/s --------- LVOT VTI, S 20.6 cm --------- LVOT peak gradient, S 3 mm Hg ---------  Aortic valve Value Reference Aortic valve peak velocity, S 387.9 cm/s --------- Aortic valve mean velocity, S 234 cm/s --------- Aortic valve VTI, S 87.74 cm --------- Aortic mean gradient, S 25 mm Hg --------- Aortic peak gradient, S 60 mm Hg --------- VTI ratio, LVOT/AV 0.23 --------- Aortic valve area, VTI 1.04 cm^2 --------- Aortic valve area/bsa, VTI 0.63 cm^2/m^2 --------- Velocity ratio, peak, LVOT/AV 0.22 --------- Aortic valve area, peak velocity 1 cm^2 --------- Aortic valve area/bsa, peak 0.61 cm^2/m^2 --------- velocity Velocity  ratio, mean, LVOT/AV 0.27 --------- Aortic valve area, mean velocity 1.22 cm^2 --------- Aortic valve area/bsa, mean 0.74 cm^2/m^2 --------- velocity  Aorta Value Reference Aortic root ID, ED 28 mm ---------  Left atrium Value Reference LA ID, A-P, ES 36 mm --------- LA ID/bsa, A-P 2.18 cm/m^2 <=2.2 LA volume, S 53 ml --------- LA volume/bsa, S 32.1 ml/m^2 ---------  LA volume, ES, 1-p A4C 35 ml --------- LA volume/bsa, ES, 1-p A4C 21.2 ml/m^2 --------- LA volume, ES, 1-p A2C 72 ml --------- LA volume/bsa, ES, 1-p A2C 43.6 ml/m^2 ---------  Mitral valve Value Reference Mitral E-wave peak velocity 60.2 cm/s --------- Mitral A-wave peak velocity 81.9 cm/s --------- Mitral deceleration time (H) 380 ms 150 - 230 Mitral E/A ratio, peak 0.7 ---------  Tricuspid valve Value Reference Tricuspid regurg peak velocity 314 cm/s --------- Tricuspid peak RV-RA gradient 39 mm Hg ---------  Right ventricle Value Reference RV s&', lateral, S 14.7 cm/s ---------  Legend: (L) and (H) mark values outside specified reference range.  ------------------------------------------------------------------- Prepared and Electronically Authenticated by  Skeet Latch, MD 2017-05-10T18:52:25  Belva Crome, MD (Primary)      Procedures   Right/Left Heart Cath and Coronary Angiography  Conclusion     Prox RCA to Mid RCA lesion, 20 %stenosed.  Mid RCA lesion, 80 %stenosed.  Ost 1st Mrg to 1st Mrg lesion, 80 %stenosed.  Mid Cx to Dist Cx lesion, 70 %stenosed.  Prox LAD to Dist LAD lesion, 40 %stenosed.  The left ventricular systolic function is normal.  LV end diastolic pressure is normal.  The left ventricular ejection fraction is 55-65% by visual estimate.  LV end diastolic pressure is normal.   There is three-vessel coronary calcification.  80% eccentric mid RCA stenosis.  80% ostial obtuse marginal #1 stenosis.  Moderate diffuse calcified LAD disease less than 50%.  Normal left ventricular systolic function and hemodynamics  Normal pulmonary pressures and pulmonary capillary wedge.  Moderate to severe aortic stenosis with aortic valve area 1.04.   RECOMMENDATIONS:   Anti-ischemic therapy to potentially include beta blocker therapy, antiplatelet therapy, and statin therapy.  Intervention on both the right coronary and obtuse marginal stenosis would be higher than usual risk due to heavy calcification and vessel tortuosity. Initial inclination is medical therapy in the absence of clear anginal symptoms.  Fatigue even at rest is of uncertain etiology. Exclude other possibilities including depression, although on one systemic illnesses.   Indications   Fatigue due to excessive exertion, initial encounter [T73.3XXA (ICD-10-CM)]  SOB (shortness of breath) on exertion [R06.02 (ICD-10-CM)]  Coronary artery disease involving native coronary artery of native heart without angina pectoris [I25.10 (ICD-10-CM)]  Nonrheumatic aortic valve stenosis [I35.0 (ICD-10-CM)]  Procedural Details/Technique   Technical Details INDICATION: Vague complaints of exertional fatigue and dyspnea. No chest pain. History of moderate aortic valve disease and prior documentation of nonobstructive  coronary disease.  An 18-gauge IV was started in the right antecubital vein. After 1% Xylocaine local infiltration a 5 French radial sheath was then advanced into the vein. Right heart catheterization was performed with a 5 French Swan-Ganz catheter. Mixed venous main pulmonary artery oxygen saturation was obtained. The catheter was removed.  The right radial area was sterilely prepped and draped. Intravenous sedation with Versed and fentanyl was administered. 1% Xylocaine was infiltrated to achieve local analgesia. A double wall stick with an angiocath was utilized to obtain intra-arterial access. The modified Seldinger technique was used to place a 45F " Slender" sheath in the right radial artery. Weight based heparin was administered. Coronary angiography was done using 5 F catheters. Right coronary angiography was performed with a JR4. Left ventricular hemodymic recordings and angiography was done using the JR 4 catheter and hand injection. Left coronary angiography was performed with a JL 3.5 cm.  Hemostasis was achieved using a pneumatic band.  During this procedure the patient is administered  a total of Versed 1 mg and Fentanyl 50 mg to achieve and maintain moderate conscious sedation. The patient's heart rate, blood pressure, and oxygen saturation are monitored continuously during the procedure. The period of conscious sedation is 52 minutes, of which I was present face-to-face 100% of this time.   Estimated blood loss <50 mL. . During this procedure the patient was administered the following to achieve and maintain moderate conscious sedation: Versed 1 mg, Fentanyl 50 mcg, while the patient's heart rate, blood pressure, and oxygen saturation were continuously monitored. The period of conscious sedation was 52 minutes, of which I was present face-to-face 100% of this time.    Complications   Complications documented before study signed (03/12/2016 4:33 PM EDT)   No complications were  associated with this study.  Documented by Belva Crome, MD - 01/17/2016 9:43 AM EDT    Coronary Findings   Dominance: Right  Left Anterior Descending  Prox LAD to Dist LAD lesion, 40% stenosed. The lesion is type C and concentric. The lesion is calcified.  Left Circumflex  Mid Cx to Dist Cx lesion, 70% stenosed. The lesion is type C. The lesion is calcified.  First Obtuse Marginal Branch  Ost 1st Mrg to 1st Mrg lesion, 80% stenosed. The lesion is type C.  Right Coronary Artery  Prox RCA to Mid RCA lesion, 20% stenosed. The lesion is severely calcified.  Mid RCA lesion, 80% stenosed. The lesion is eccentric. The lesion is severely calcified.  Right Heart   Right Heart Pressures LV EDP is normal.    Right Atrium Right atrial pressure is normal.    Wall Motion        Resting               Left Heart   Left Ventricle The left ventricular size is normal. The left ventricular systolic function is normal. LV end diastolic pressure is normal. The left ventricular ejection fraction is 55-65% by visual estimate. No regional wall motion abnormalities.    Coronary Diagrams   Diagnostic Diagram     Implants        No implant documentation for this case.  PACS Images   Show images for Cardiac catheterization   Link to Procedure Log   Procedure Log    Hemo Data   Flowsheet Row Most Recent Value  Fick Cardiac Output 4.39 L/min  Fick Cardiac Output Index 2.6 (L/min)/BSA  Aortic Mean Gradient 25.1 mmHg  Aortic Peak Gradient 26 mmHg  Aortic Valve Area 1.04  Aortic Value Area Index 0.62 cm2/BSA  RA A Wave 4 mmHg  RA V Wave 4 mmHg  RA Mean 2 mmHg  RV Systolic Pressure 34 mmHg  RV Diastolic Pressure -1 mmHg  RV EDP 6 mmHg  PA Systolic Pressure 32 mmHg  PA Diastolic Pressure 6 mmHg  PA Mean 14 mmHg  PW A Wave 5 mmHg  PW V Wave 4 mmHg  PW Mean 4 mmHg  AO Systolic Pressure 563 mmHg  AO Diastolic Pressure 54 mmHg  AO Mean 81 mmHg  LV Systolic Pressure  893 mmHg  LV Diastolic Pressure 5 mmHg  LV EDP 11 mmHg  Arterial Occlusion Pressure Extended Systolic Pressure 734 mmHg  Arterial Occlusion Pressure Extended Diastolic Pressure 49 mmHg  Arterial Occlusion Pressure Extended Mean Pressure 75 mmHg  Left Ventricular Apex Extended Systolic Pressure 287 mmHg  Left Ventricular Apex Extended Diastolic Pressure 5 mmHg  Left Ventricular Apex Extended EDP Pressure 10 mmHg  QP/QS 1  TPVR Index 5.78 HRUI  TSVR Index 31.21 HRUI  PVR SVR Ratio 0.14  TPVR/TSVR Ratio 0.19    PAYSON EVRARD  Cardiac catheterization  Order# [id number]  Reading physician: Sherren Mocha, MD Ordering physician: Sherren Mocha, MD Study date: 04/30/16  Physicians   Panel Physicians Referring Physician Case Authorizing Physician  Sherren Mocha, MD (Primary)    Procedures   Coronary/Graft Atherectomy  Conclusion   Successful rotational atherectomy and stenting of the right coronary artery with a 3.0 x 12 mm Synergy DES.  Recommendations: Triple therapy with aspirin 81 mg, Plavix 75 mg, and apixaban 30 days, then discontinue aspirin. Plans for TAVR in approximately 3-4 weeks.  Indications   Exertional dyspnea [R06.09 (ICD-10-CM)]  Procedural Details/Technique   Technical Details INDICATION: 83 year old gentleman with coronary artery disease. He also has atrial fibrillation and severe aortic stenosis with plans for TAVR. He underwent permanent pacemaker placement last month for treatment of tachybradycardia syndrome. He presents for PCI of the right coronary artery which has severe calcific stenosis in its midportion. Plans are to perform PCI prior to TAVR for definitive treatment of his aortic stenosis. The patient has exertional dyspnea with class III symptoms.  PROCEDURAL DETAILS:  The right groin was prepped, draped, and anesthetized with 1% lidocaine. Using modified Seldinger technique, a 7 French sheath was introduced into the right femoral artery. A  7 [place] AL-1 guide catheter was used. Initially, a 300 cm cougar wire was advanced down the right coronary artery with plans to transited out for a roto-floppy wire. A 2.5 mm over-the-wire balloon was advanced into the distal RCA without difficulty. However, I was unable to advance the roto-wire through the balloon because of a proximal band. The cougar wire was repositioned and the balloon was removed. A Rotafloppy wire was then used to cross the lesion. The cougar wire was removed. A 1.5 mm bur was used to perform rotational atherectomy on the lesion. A total of 3 runs were made. The patient tolerated this well. The Rotablator bur was then removed and a cougar wire was again advanced into the distal RCA. The lesion was dilated with a 2.5 mm balloon, stented with a 3.0 x 12 mm Synergy DES, and the stent was postdilated with a 3.25 mm noncompliant balloon to 16 atm. The patient tolerated the entire procedure well. There was TIMI-3 flow and 0% residual stenosis at the completion of the case. The femoral arteriotomy was closed with a Perclose device. The patient has a high femoral artery bifurcation and the Perclose device was deployed at the level of the bifurcation. There were no immediate procedural complications. The patient was transferred to the post catheterization recovery area for further monitoring.    Estimated blood loss <50 mL.  During this procedure the patient was administered the following to achieve and maintain moderate conscious sedation: Versed 3 mg, Fentanyl 75 mcg, while the patient's heart rate, blood pressure, and oxygen saturation were continuously monitored. The period of conscious sedation was 73 minutes, of which I was present face-to-face 100% of this time.    Coronary Findings   Dominance: Right  Left Anterior Descending  Prox LAD to Dist LAD lesion, 40% stenosed. The lesion is type C and concentric. The lesion is calcified.  Left Circumflex  Mid Cx to Dist Cx lesion, 70%  stenosed. The lesion is type C. The lesion is calcified.  First Obtuse Marginal Branch  Ost 1st Mrg to 1st Mrg lesion, 80% stenosed. The lesion is type C.  Right  Coronary Artery  Prox RCA to Mid RCA lesion, 20% stenosed. The lesion is severely calcified.  Mid RCA lesion, 90% stenosed. The lesion is eccentric. The lesion is severely calcified.  Angioplasty: Lesion crossed with guidewire. Pre-stent angioplasty was performed. A STENT SYNERGY DES 3X12 drug eluting stent was successfully placed. Post-stent angioplasty was performed. The pre-interventional distal flow is normal (TIMI 3). The post-interventional distal flow is normal (TIMI 3). The intervention was successful . No complications occurred at this lesion.  Atherectomy: Rotational atherectomy was performed using a CATH ROTALINK PLUS 1.50MM. Atherectomy device crossed the lesion. The intervention was successful.  There is no residual stenosis post intervention.    ADDENDUM REPORT: 03/19/2016 17:46  CLINICAL DATA: 83 year old male with severe aortic stenosis.  EXAM: Cardiac TAVR CT  TECHNIQUE: The patient was scanned on a Philips 256 scanner. A 120 kV retrospective scan was triggered in the descending thoracic aorta at 111 HU's. Gantry rotation speed was 270 msecs and collimation was .9 mm. 10 mg of iv Metoprolol and no nitro were given. The 3D data set was reconstructed in 5% intervals of the R-R cycle. Systolic and diastolic phases were analyzed on a dedicated work station using MPR, MIP and VRT modes. The patient received 80 cc of contrast.  FINDINGS: Aortic Valve: Trileaflet, severely thickened and calcified with severely restricted leaflet opening. There is minimal calcification extending into the LVOT.  Aorta: Normal size, moderate diffuse calcifications, no dissection.  Sinotubular Junction: 31 x 31 mm  Ascending Thoracic Aorta: 35 x 34 mm  Aortic Arch: 25 x 25 mm  Descending Thoracic Aorta: 23 x 23  mm  Sinus of Valsalva Measurements:  Non-coronary: 35 mm  Right -coronary: 36 mm  Left -coronary: 36 mm  Coronary Artery Height above Annulus:  Left Main: 11 mm  Right Coronary: 12 mm  Virtual Basal Annulus Measurements:  Maximum/Minimum Diameter: 27 x 26 mm  Perimeter: 82 mm  Area: 504 mm2  Optimum Fluoroscopic Angle for Delivery: LAO 8 CAU 7  Other findings:  A large left atrial appendage without evidence of a thrombus.  Normal pulmonary vein drainage into the left atrium.  Dilated pulmonary artery measuring 34 x 25 mm.  IMPRESSION: 1. Trileaflet, severely thickened and calcified aortic valve with severely restricted leaflet opening and annular measurements suitable for delivery of a 26 mm Edwards-SAPIEN 3 valve.  2. Sufficient annulus to coronary distance.  3. Optimum Fluoroscopic Angle for Delivery: LAO 8 CAU 7.  4. A large left atrial appendage without evidence of a thrombus  5. Dilated pulmonary artery measuring 34 x 25 mm  Ena Dawley   Electronically Signed By: Ena Dawley On: 03/19/2016 17:46   CLINICAL DATA: 83 year old male with history of severe aortic stenosis. Preprocedural study prior to potential transcatheter aortic valve replacement (TAVR). Chest tightness and sensation of racing heart beat. Fatigue yesterday.  EXAM: CT ANGIOGRAPHY CHEST, ABDOMEN AND PELVIS  TECHNIQUE: Multidetector CT imaging through the chest, abdomen and pelvis was performed using the standard protocol during bolus administration of intravenous contrast. Multiplanar reconstructed images and MIPs were obtained and reviewed to evaluate the vascular anatomy.  CONTRAST: 70 mL of Isovue 370.  COMPARISON: No priors.  FINDINGS: CTA CHEST FINDINGS  Cardiovascular: Heart size is mildly enlarged. There is no significant pericardial fluid, thickening or pericardial calcification. There is aortic  atherosclerosis, as well as atherosclerosis of the great vessels of the mediastinum and the coronary arteries, including calcified atherosclerotic plaque in the left main, left anterior descending, left circumflex  and right coronary arteries. Severe thickening calcification of the aortic valve.  Mediastinum/Lymph Nodes: No pathologically enlarged mediastinal or hilar lymph nodes. Densely calcified right hilar lymph node incidentally noted. Esophagus is unremarkable in appearance. No axillary lymphadenopathy.  Lungs/Pleura: There are several pulmonary nodules scattered throughout the lungs bilaterally, the largest of which measures up to 8 x 6 mm (mean diameter of 7 mm) in the subpleural aspect of the right lower lobe (image 41 of series 407). These are nonspecific, but most of these are subpleural in location, likely to represent subpleural lymph nodes. Areas of scarring and/or subsegmental atelectasis are noted in the lung bases bilaterally, particularly in the right lower lobe. No acute consolidative airspace disease. No pleural effusions. Small right-sided Bochdalek's hernia incidentally noted.  Musculoskeletal/Soft Tissues: There are no aggressive appearing lytic or blastic lesions noted in the visualized portions of the skeleton.  CTA ABDOMEN AND PELVIS FINDINGS  Hepatobiliary: Several sub cm well-defined low-attenuation lesions are noted in the liver, largest of which measures up to 9 mm in segment 4A. These are too small to definitively characterize, but are favored to represent tiny cysts. No other definite suspicious appearing hepatic lesions are noted. There is no intra or extrahepatic biliary ductal dilatation. Gallbladder is normal in appearance.  Pancreas: No pancreatic mass. No pancreatic ductal dilatation. No pancreatic or peripancreatic fluid or inflammatory changes.  Spleen: Unremarkable.  Adrenals/Urinary Tract: Sub cm low-attenuation lesions in the  left kidney are too small to definitively characterize, but are statistically likely tiny cysts. Right kidney and bilateral adrenal glands are normal in appearance. No hydroureteronephrosis. Urinary bladder is normal in appearance.  Stomach/Bowel: The appearance of the stomach is normal. There is no pathologic dilatation of small bowel or colon. The appendix is not confidently identified and may be surgically absent. Regardless, there are no inflammatory changes noted adjacent to the cecum to suggest the presence of an acute appendicitis at this time.  Vascular/Lymphatic: Vascular findings and measurements pertinent to potential TAVR procedure, as detailed below. No aneurysm or dissection identified in the abdominal or pelvic vasculature. The celiac axis, superior mesenteric artery and inferior mesenteric artery all appear widely patent, without hemodynamically significant stenosis. There are single renal arteries bilaterally which are both widely patent. There is complete inclusion of the right internal iliac artery proximally, which is subsequently reconstituted distally from collateral pathways. No lymphadenopathy noted in the abdomen or pelvis.  Reproductive: Fiducial markers associated with the prostate gland. Prostate gland and seminal vesicles are otherwise unremarkable in appearance.  Other: Low-attenuation material in the right inguinal region, presumably a Prolene plug from prior herniorrhaphy. No significant volume of ascites. No pneumoperitoneum.  Musculoskeletal: Bilateral pars defects at L5 with 14 mm of anterolisthesis of L5 upon S1. There are no aggressive appearing lytic or blastic lesions noted in the visualized portions of the skeleton.  VASCULAR MEASUREMENTS PERTINENT TO TAVR:  AORTA:  Minimal Aortic Diameter - 13 x 14 mm  Severity of Aortic Calcification - moderate  RIGHT PELVIS:  Right Common Iliac Artery -  Minimal Diameter - 7.7 x  6.4 mm  Tortuosity - mild  Calcification - moderate  Right External Iliac Artery -  Minimal Diameter - 7.2 x 7.1 mm  Tortuosity - severe  Calcification - none  Right Common Femoral Artery -  Minimal Diameter - 6.9 x 8.0 mm  Tortuosity - mild  Calcification - none  LEFT PELVIS:  Left Common Iliac Artery -  Minimal Diameter - 6.1 x 7.3 mm  Tortuosity -  moderate  Calcification - mild  Left External Iliac Artery -  Minimal Diameter - 6.8 x 6.9 mm  Tortuosity - severe  Calcification - none  Left Common Femoral Artery -  Minimal Diameter - 6.5 x 7.9 mm  Tortuosity - mild  Calcification - mild  Review of the MIP images confirms the above findings.  IMPRESSION: 1. Vascular findings and measurements pertinent to potential TAVR procedure, as above. This patient appears to have suitable pelvic arterial access bilaterally from a size perspective, although there is extensive tortuosity bilaterally, particularly in the external iliac arteries, as detailed above. 2. Severe thickening calcification of the aortic valve, compatible with the clinical history of severe aortic stenosis. 3. Aortic atherosclerosis, in addition to left main and 3 vessel coronary artery disease. 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 4. Mild cardiomegaly. 5. Multiple small pulmonary nodules scattered throughout the lungs bilaterally, measuring 7 mm or less in mean diameter. Many of these are subpleural in location, and are therefore favored to represent benign subpleural lymph nodes, however, these are all considered nonspecific. Non-contrast chest CT at 3-6 months is recommended. If the nodules are stable at time of repeat CT, then future CT at 18-24 months (from today's scan) is considered optional for low-risk patients, but  is recommended for high-risk patients. This recommendation follows the consensus statement: Guidelines for Management of Incidental Pulmonary Nodules Detected on CT Images: From the Fleischner Society 2017; Radiology 2017; 284:228-243. 6. Additional incidental findings, as above.   Electronically Signed By: Vinnie Langton M.D. On: 03/20/2016 10:19  ProcedureAVR + CABG  Risk of Mortality5.7% Morbidity or Mortality29.1% Prolonged LOS16.4% Short LOS16.3% Permanent Stroke3.6% Prolonged Vent Support17.2% DSW Infection0.3% Renal Failure8.5% Reoperation12.6%   Impression:  This 83 year old gentleman has stage D severe, symptomatic aortic stenosis and multivessel CAD s/p atherectomy and DES of the mid RCA 0n 04/30/2016. His chest discomfort has resolved but he continues to have exertional shortness of breath and fatigue with dizziness at low levels of activity, NYHA class III. I have personally reviewed his echo, cath and CT studies. His mean aortic valve gradient was only 25 by echo last May but the DI was 0.22 and his valve was severely calcified with restricted leaflet mobility and looked like a severely stenotic valve. His mean gradient by cath was 25.1 with an AVA of 1.04. I think he has low gradient, normal LVEF, severe AS and AVR is indicated. His cath shows 3-vessel CAD but the most concerning stenosis in the RCA has been treated with PCI and the other lesions can be managed medically for now. He is in the intermediate risk category for open surgical AVR and I think TAVR is a reasonable alternative treatment for him, especially given his age  and degenerative arthritis that would make recovery from open surgery much slower.   His gated cardiac CT shows anatomy favorable for TAVR using a Sapien 3 valve. His abdominal and pelvic CT shows adequate pelvic arterial anatomy for transfemoral access. His chest CT shows multiple small pulmonary nodules scattered throughout both lungs measuring up to 7 mm or less that are likely benign but a follow up CT in 6 months is recommended and if they are stable then I don't think any further follow up is indicated since he is a life-long non-smoker.  The patient and his wife were counseled at length regarding treatment alternatives for  management of severe symptomatic aortic stenosis. Alternative approaches such as conventional aortic valve replacement, transcatheter aortic valve replacement, and palliative medical therapy were compared and contrasted at length. The risks associated with conventional surgical aortic valve replacement were been discussed in detail, as were expectations for post-operative convalescence. Long-term prognosis with medical therapy was discussed. I discussed transcatheter aortic valve replacement, what types of management strategies would be attempted intraoperatively in the event of life-threatening complications, including whether or not the patient would be considered a candidate for the use of cardiopulmonary bypass and/or conversion to open sternotomy for attempted surgical intervention.  The patient has been advised of a variety of complications that might develop including but not limited to risks of death, stroke, paravalvular leak, aortic dissection or other major vascular complications, aortic annulus rupture, device embolization, cardiac rupture or perforation, mitral regurgitation, acute myocardial infarction, arrhythmia, heart block or bradycardia requiring permanent pacemaker placement, congestive heart failure, respiratory failure, renal failure, pneumonia, infection, other  late complications related to structural valve deterioration or migration, or other complications that might ultimately cause a temporary or permanent loss of functional independence or other long term morbidity.  The patient provides full informed consent for the procedure as described and all questions were answered.    Plan:  Transfemoral TAVR on Tuesday 06/10/2016    Gaye Pollack, MD

## 2016-06-10 ENCOUNTER — Inpatient Hospital Stay (HOSPITAL_COMMUNITY): Payer: Medicare Other

## 2016-06-10 ENCOUNTER — Inpatient Hospital Stay (HOSPITAL_COMMUNITY): Payer: Medicare Other | Admitting: Anesthesiology

## 2016-06-10 ENCOUNTER — Other Ambulatory Visit: Payer: Self-pay

## 2016-06-10 ENCOUNTER — Inpatient Hospital Stay (HOSPITAL_COMMUNITY)
Admission: RE | Admit: 2016-06-10 | Discharge: 2016-06-12 | DRG: 267 | Disposition: A | Discharge status: Home / Self Care | Payer: Medicare Other | Source: Ambulatory Visit | Attending: Cardiovascular Disease | Admitting: Cardiovascular Disease

## 2016-06-10 ENCOUNTER — Encounter (HOSPITAL_COMMUNITY): Payer: Self-pay | Admitting: *Deleted

## 2016-06-10 ENCOUNTER — Encounter (HOSPITAL_COMMUNITY)
Admission: RE | Disposition: A | Discharge status: Home / Self Care | Payer: Self-pay | Source: Ambulatory Visit | Attending: Cardiovascular Disease

## 2016-06-10 DIAGNOSIS — Z7982 Long term (current) use of aspirin: Secondary | ICD-10-CM | POA: Diagnosis not present

## 2016-06-10 DIAGNOSIS — E785 Hyperlipidemia, unspecified: Secondary | ICD-10-CM | POA: Diagnosis not present

## 2016-06-10 DIAGNOSIS — Z79899 Other long term (current) drug therapy: Secondary | ICD-10-CM

## 2016-06-10 DIAGNOSIS — Z888 Allergy status to other drugs, medicaments and biological substances status: Secondary | ICD-10-CM | POA: Diagnosis not present

## 2016-06-10 DIAGNOSIS — M545 Low back pain: Secondary | ICD-10-CM | POA: Diagnosis not present

## 2016-06-10 DIAGNOSIS — G4733 Obstructive sleep apnea (adult) (pediatric): Secondary | ICD-10-CM | POA: Diagnosis not present

## 2016-06-10 DIAGNOSIS — I700 Atherosclerosis of aorta: Secondary | ICD-10-CM | POA: Diagnosis not present

## 2016-06-10 DIAGNOSIS — Z7901 Long term (current) use of anticoagulants: Secondary | ICD-10-CM | POA: Diagnosis not present

## 2016-06-10 DIAGNOSIS — I480 Paroxysmal atrial fibrillation: Principal | ICD-10-CM | POA: Diagnosis present

## 2016-06-10 DIAGNOSIS — Z8249 Family history of ischemic heart disease and other diseases of the circulatory system: Secondary | ICD-10-CM

## 2016-06-10 DIAGNOSIS — I350 Nonrheumatic aortic (valve) stenosis: Secondary | ICD-10-CM | POA: Diagnosis not present

## 2016-06-10 DIAGNOSIS — G8929 Other chronic pain: Secondary | ICD-10-CM | POA: Diagnosis not present

## 2016-06-10 DIAGNOSIS — Z8551 Personal history of malignant neoplasm of bladder: Secondary | ICD-10-CM

## 2016-06-10 DIAGNOSIS — Z7902 Long term (current) use of antithrombotics/antiplatelets: Secondary | ICD-10-CM | POA: Diagnosis not present

## 2016-06-10 DIAGNOSIS — K219 Gastro-esophageal reflux disease without esophagitis: Secondary | ICD-10-CM | POA: Diagnosis present

## 2016-06-10 DIAGNOSIS — E7800 Pure hypercholesterolemia, unspecified: Secondary | ICD-10-CM | POA: Diagnosis not present

## 2016-06-10 DIAGNOSIS — Z961 Presence of intraocular lens: Secondary | ICD-10-CM | POA: Diagnosis present

## 2016-06-10 DIAGNOSIS — Z952 Presence of prosthetic heart valve: Secondary | ICD-10-CM

## 2016-06-10 DIAGNOSIS — M170 Bilateral primary osteoarthritis of knee: Secondary | ICD-10-CM | POA: Diagnosis not present

## 2016-06-10 DIAGNOSIS — Z954 Presence of other heart-valve replacement: Secondary | ICD-10-CM | POA: Diagnosis not present

## 2016-06-10 DIAGNOSIS — Z8546 Personal history of malignant neoplasm of prostate: Secondary | ICD-10-CM

## 2016-06-10 DIAGNOSIS — Z006 Encounter for examination for normal comparison and control in clinical research program: Secondary | ICD-10-CM

## 2016-06-10 DIAGNOSIS — I2510 Atherosclerotic heart disease of native coronary artery without angina pectoris: Secondary | ICD-10-CM | POA: Diagnosis present

## 2016-06-10 DIAGNOSIS — Z950 Presence of cardiac pacemaker: Secondary | ICD-10-CM | POA: Diagnosis not present

## 2016-06-10 DIAGNOSIS — Z881 Allergy status to other antibiotic agents status: Secondary | ICD-10-CM

## 2016-06-10 DIAGNOSIS — I10 Essential (primary) hypertension: Secondary | ICD-10-CM | POA: Diagnosis present

## 2016-06-10 DIAGNOSIS — I739 Peripheral vascular disease, unspecified: Secondary | ICD-10-CM | POA: Diagnosis not present

## 2016-06-10 HISTORY — PX: TRANSCATHETER AORTIC VALVE REPLACEMENT, TRANSFEMORAL: SHX6400

## 2016-06-10 HISTORY — PX: TEE WITHOUT CARDIOVERSION: SHX5443

## 2016-06-10 LAB — POCT I-STAT 3, ART BLOOD GAS (G3+)
ACID-BASE DEFICIT: 1 mmol/L (ref 0.0–2.0)
Acid-base deficit: 1 mmol/L (ref 0.0–2.0)
Bicarbonate: 22.2 mmol/L (ref 20.0–28.0)
Bicarbonate: 22.2 mmol/L (ref 20.0–28.0)
O2 Saturation: 96 %
O2 Saturation: 96 %
PO2 ART: 76 mmHg — AB (ref 83.0–108.0)
PO2 ART: 76 mmHg — AB (ref 83.0–108.0)
TCO2: 23 mmol/L (ref 0–100)
TCO2: 23 mmol/L (ref 0–100)
pCO2 arterial: 31.1 mmHg — ABNORMAL LOW (ref 32.0–48.0)
pCO2 arterial: 31.1 mmHg — ABNORMAL LOW (ref 32.0–48.0)
pH, Arterial: 7.46 — ABNORMAL HIGH (ref 7.350–7.450)
pH, Arterial: 7.46 — ABNORMAL HIGH (ref 7.350–7.450)

## 2016-06-10 LAB — PROTIME-INR
INR: 1.26
Prothrombin Time: 15.9 seconds — ABNORMAL HIGH (ref 11.4–15.2)

## 2016-06-10 LAB — ECHO TEE
AO mean calculated velocity dopler: 178 cm/s
AV Area VTI index: 1.11 cm2/m2
AV Area VTI: 3.77 cm2
AV Area mean vel: 1.95 cm2
AV Peak grad: 8 mmHg
AV VEL mean LVOT/AV: 0.51
AV pk vel: 145 cm/s
AVAREAMEANVIN: 1.15 cm2/m2
AVG: 17 mmHg
AVLVOTPG: 8 mmHg
Ao pk vel: 0.99 m/s
CHL CUP AV PEAK INDEX: 2.22
CHL CUP AV VALUE AREA INDEX: 1.11
CHL CUP AV VEL: 1.88
CHL CUP DOP CALC LVOT VTI: 33.7 cm
LDCA: 3.8 cm2
LVOT SV: 128 mL
LVOT peak VTI: 0.49 cm
LVOTD: 22 mm
LVOTPV: 144 cm/s
TVMG: 257 mmHg
VTI: 68.1 cm
Valve area: 1.88 cm2

## 2016-06-10 LAB — POCT I-STAT, CHEM 8
BUN: 22 mg/dL — AB (ref 6–20)
BUN: 22 mg/dL — AB (ref 6–20)
BUN: 21 mg/dL — AB (ref 6–20)
CALCIUM ION: 1.15 mmol/L (ref 1.15–1.40)
CALCIUM ION: 1.16 mmol/L (ref 1.15–1.40)
CHLORIDE: 106 mmol/L (ref 101–111)
CHLORIDE: 107 mmol/L (ref 101–111)
CHLORIDE: 106 mmol/L (ref 101–111)
CREATININE: 1.1 mg/dL (ref 0.61–1.24)
Calcium, Ion: 1.14 mmol/L — ABNORMAL LOW (ref 1.15–1.40)
Creatinine, Ser: 1 mg/dL (ref 0.61–1.24)
Creatinine, Ser: 1.1 mg/dL (ref 0.61–1.24)
GLUCOSE: 116 mg/dL — AB (ref 65–99)
GLUCOSE: 116 mg/dL — AB (ref 65–99)
Glucose, Bld: 103 mg/dL — ABNORMAL HIGH (ref 65–99)
HCT: 33 % — ABNORMAL LOW (ref 39.0–52.0)
HCT: 33 % — ABNORMAL LOW (ref 39.0–52.0)
HEMATOCRIT: 33 % — AB (ref 39.0–52.0)
HEMOGLOBIN: 11.2 g/dL — AB (ref 13.0–17.0)
Hemoglobin: 11.2 g/dL — ABNORMAL LOW (ref 13.0–17.0)
Hemoglobin: 11.2 g/dL — ABNORMAL LOW (ref 13.0–17.0)
POTASSIUM: 4.2 mmol/L (ref 3.5–5.1)
Potassium: 4.1 mmol/L (ref 3.5–5.1)
Potassium: 4.4 mmol/L (ref 3.5–5.1)
SODIUM: 138 mmol/L (ref 135–145)
Sodium: 137 mmol/L (ref 135–145)
Sodium: 139 mmol/L (ref 135–145)
TCO2: 24 mmol/L (ref 0–100)
TCO2: 23 mmol/L (ref 0–100)
TCO2: 24 mmol/L (ref 0–100)

## 2016-06-10 LAB — POCT I-STAT 4, (NA,K, GLUC, HGB,HCT)
Glucose, Bld: 99 mg/dL (ref 65–99)
Glucose, Bld: 99 mg/dL (ref 65–99)
HCT: 32 % — ABNORMAL LOW (ref 39.0–52.0)
HEMATOCRIT: 32 % — AB (ref 39.0–52.0)
HEMOGLOBIN: 10.9 g/dL — AB (ref 13.0–17.0)
Hemoglobin: 10.9 g/dL — ABNORMAL LOW (ref 13.0–17.0)
POTASSIUM: 4 mmol/L (ref 3.5–5.1)
Potassium: 4 mmol/L (ref 3.5–5.1)
SODIUM: 141 mmol/L (ref 135–145)
Sodium: 141 mmol/L (ref 135–145)

## 2016-06-10 LAB — CBC
HEMATOCRIT: 33.1 % — AB (ref 39.0–52.0)
HEMOGLOBIN: 10.8 g/dL — AB (ref 13.0–17.0)
MCH: 29 pg (ref 26.0–34.0)
MCHC: 32.6 g/dL (ref 30.0–36.0)
MCV: 89 fL (ref 78.0–100.0)
Platelets: 190 10*3/uL (ref 150–400)
RBC: 3.72 MIL/uL — ABNORMAL LOW (ref 4.22–5.81)
RDW: 13.5 % (ref 11.5–15.5)
WBC: 4.7 10*3/uL (ref 4.0–10.5)

## 2016-06-10 LAB — APTT: aPTT: 33 seconds (ref 24–36)

## 2016-06-10 SURGERY — IMPLANTATION, AORTIC VALVE, TRANSCATHETER, FEMORAL APPROACH
Anesthesia: General | Site: Chest

## 2016-06-10 MED ORDER — AMIODARONE HCL 200 MG PO TABS
200.0000 mg | ORAL_TABLET | Freq: Every day | ORAL | Status: DC
  Administered 2016-06-11 – 2016-06-12 (×2): 200 mg via ORAL
  Filled 2016-06-10 (×2): qty 1

## 2016-06-10 MED ORDER — SODIUM CHLORIDE 0.9 % IR SOLN
Status: DC | PRN
  Administered 2016-06-10: 5000 mL

## 2016-06-10 MED ORDER — ONDANSETRON HCL 4 MG/2ML IJ SOLN: 4.0000 mg | Freq: Four times a day (QID) | INTRAMUSCULAR | Status: DC | PRN

## 2016-06-10 MED ORDER — ATORVASTATIN CALCIUM 40 MG PO TABS
40.0000 mg | ORAL_TABLET | Freq: Every day | ORAL | Status: DC
  Administered 2016-06-10 – 2016-06-11 (×2): 40 mg via ORAL
  Filled 2016-06-10 (×2): qty 1

## 2016-06-10 MED ORDER — METOPROLOL TARTRATE 25 MG/10 ML ORAL SUSPENSION: 12.5000 mg | Freq: Two times a day (BID) | ORAL | Status: DC

## 2016-06-10 MED ORDER — DEXTROSE 5 % IV SOLN
1.5000 g | Freq: Two times a day (BID) | INTRAVENOUS | Status: DC
  Administered 2016-06-10 – 2016-06-11 (×2): 1.5 g via INTRAVENOUS
  Filled 2016-06-10 (×3): qty 1.5

## 2016-06-10 MED ORDER — MORPHINE SULFATE (PF) 2 MG/ML IV SOLN
2.0000 mg | INTRAVENOUS | Status: DC | PRN
Start: 2016-06-10 — End: 2016-06-11

## 2016-06-10 MED ORDER — HEPARIN SODIUM (PORCINE) 1000 UNIT/ML IJ SOLN
INTRAMUSCULAR | Status: DC | PRN
  Administered 2016-06-10: 9000 [IU] via INTRAVENOUS

## 2016-06-10 MED ORDER — FAMOTIDINE IN NACL 20-0.9 MG/50ML-% IV SOLN
20.0000 mg | Freq: Two times a day (BID) | INTRAVENOUS | Status: DC
  Administered 2016-06-10: 20 mg via INTRAVENOUS
  Filled 2016-06-10: qty 50

## 2016-06-10 MED ORDER — VANCOMYCIN HCL IN DEXTROSE 1-5 GM/200ML-% IV SOLN
1000.0000 mg | Freq: Once | INTRAVENOUS | Status: AC
  Administered 2016-06-10: 1000 mg via INTRAVENOUS
  Filled 2016-06-10: qty 200

## 2016-06-10 MED ORDER — SODIUM CHLORIDE 0.9 % IV SOLN
INTRAVENOUS | Status: DC | PRN
Start: 2016-06-10 — End: 2016-06-10
  Administered 2016-06-10: 500 mL

## 2016-06-10 MED ORDER — OXYCODONE HCL 5 MG PO TABS: 5.0000 mg | ORAL_TABLET | ORAL | Status: DC | PRN

## 2016-06-10 MED ORDER — PROTAMINE SULFATE 10 MG/ML IV SOLN
INTRAVENOUS | Status: DC | PRN
  Administered 2016-06-10: 90 mg via INTRAVENOUS

## 2016-06-10 MED ORDER — ONDANSETRON HCL 4 MG/2ML IJ SOLN
INTRAMUSCULAR | Status: DC | PRN
  Administered 2016-06-10: 4 mg via INTRAVENOUS

## 2016-06-10 MED ORDER — PANTOPRAZOLE SODIUM 40 MG PO TBEC
40.0000 mg | DELAYED_RELEASE_TABLET | Freq: Every day | ORAL | Status: DC
  Administered 2016-06-11: 40 mg via ORAL
  Filled 2016-06-10: qty 1

## 2016-06-10 MED ORDER — PHENYLEPHRINE HCL 10 MG/ML IJ SOLN
INTRAMUSCULAR | Status: DC | PRN
  Administered 2016-06-10 (×2): 40 ug via INTRAVENOUS

## 2016-06-10 MED ORDER — SUCCINYLCHOLINE CHLORIDE 20 MG/ML IJ SOLN
INTRAMUSCULAR | Status: DC | PRN
  Administered 2016-06-10: 60 mg via INTRAVENOUS

## 2016-06-10 MED ORDER — CHLORHEXIDINE GLUCONATE 0.12 % MT SOLN
15.0000 mL | OROMUCOSAL | Status: AC
  Administered 2016-06-10: 15 mL via OROMUCOSAL
  Filled 2016-06-10: qty 15

## 2016-06-10 MED ORDER — METOPROLOL TARTRATE 12.5 MG HALF TABLET
12.5000 mg | ORAL_TABLET | Freq: Two times a day (BID) | ORAL | Status: DC
  Administered 2016-06-10 (×2): 12.5 mg via ORAL
  Filled 2016-06-10 (×2): qty 1

## 2016-06-10 MED ORDER — SODIUM CHLORIDE 0.9% FLUSH
3.0000 mL | Freq: Two times a day (BID) | INTRAVENOUS | Status: DC
  Administered 2016-06-10 – 2016-06-11 (×2): 3 mL via INTRAVENOUS

## 2016-06-10 MED ORDER — REMIFENTANIL HCL 1 MG IV SOLR
INTRAVENOUS | Status: DC | PRN
  Administered 2016-06-10: .5 ug/kg/min via INTRAVENOUS

## 2016-06-10 MED ORDER — SODIUM CHLORIDE 0.9 % IV SOLN
1.0000 mL/kg/h | INTRAVENOUS | Status: AC
  Administered 2016-06-10: 1 mL/kg/h via INTRAVENOUS

## 2016-06-10 MED ORDER — SODIUM CHLORIDE 0.9% FLUSH: 3.0000 mL | INTRAVENOUS | Status: DC | PRN

## 2016-06-10 MED ORDER — PROPOFOL 10 MG/ML IV BOLUS
INTRAVENOUS | Status: AC
  Filled 2016-06-10: qty 20

## 2016-06-10 MED ORDER — CHLORHEXIDINE GLUCONATE 4 % EX LIQD: 60.0000 mL | Freq: Once | CUTANEOUS | Status: DC

## 2016-06-10 MED ORDER — FENTANYL CITRATE (PF) 250 MCG/5ML IJ SOLN
INTRAMUSCULAR | Status: AC
  Filled 2016-06-10: qty 5

## 2016-06-10 MED ORDER — METOPROLOL TARTRATE 5 MG/5ML IV SOLN: 2.5000 mg | INTRAVENOUS | Status: DC | PRN

## 2016-06-10 MED ORDER — ALBUMIN HUMAN 5 % IV SOLN: 250.0000 mL | INTRAVENOUS | Status: DC | PRN

## 2016-06-10 MED ORDER — PHENYLEPHRINE 40 MCG/ML (10ML) SYRINGE FOR IV PUSH (FOR BLOOD PRESSURE SUPPORT)
PREFILLED_SYRINGE | INTRAVENOUS | Status: AC
  Filled 2016-06-10: qty 10

## 2016-06-10 MED ORDER — ALBUMIN HUMAN 5 % IV SOLN
INTRAVENOUS | Status: DC | PRN
  Administered 2016-06-10: 07:00:00 via INTRAVENOUS

## 2016-06-10 MED ORDER — PROPOFOL 10 MG/ML IV BOLUS
INTRAVENOUS | Status: DC | PRN
  Administered 2016-06-10: 10 mg via INTRAVENOUS
  Administered 2016-06-10: 60 mg via INTRAVENOUS

## 2016-06-10 MED ORDER — NITROGLYCERIN IN D5W 200-5 MCG/ML-% IV SOLN
0.0000 ug/min | INTRAVENOUS | Status: DC
  Administered 2016-06-10: 10 ug/min via INTRAVENOUS

## 2016-06-10 MED ORDER — ONDANSETRON HCL 4 MG/2ML IJ SOLN
INTRAMUSCULAR | Status: AC
  Filled 2016-06-10: qty 2

## 2016-06-10 MED ORDER — NOREPINEPHRINE BITARTRATE 1 MG/ML IV SOLN
INTRAVENOUS | Status: DC | PRN
  Administered 2016-06-10: 2 ug/min via INTRAVENOUS
  Administered 2016-06-10: 4 ug/min via INTRAVENOUS

## 2016-06-10 MED ORDER — PHENYLEPHRINE HCL 10 MG/ML IJ SOLN
0.0000 ug/min | INTRAVENOUS | Status: DC
  Filled 2016-06-10: qty 2

## 2016-06-10 MED ORDER — FENTANYL CITRATE (PF) 100 MCG/2ML IJ SOLN
INTRAMUSCULAR | Status: DC | PRN
  Administered 2016-06-10: 50 ug via INTRAVENOUS

## 2016-06-10 MED ORDER — TRAMADOL HCL 50 MG PO TABS
50.0000 mg | ORAL_TABLET | ORAL | Status: DC | PRN
  Administered 2016-06-10: 50 mg via ORAL
  Administered 2016-06-11: 100 mg via ORAL
  Filled 2016-06-10: qty 2
  Filled 2016-06-10: qty 1

## 2016-06-10 MED ORDER — LACTATED RINGERS IV SOLN
INTRAVENOUS | Status: DC | PRN
  Administered 2016-06-10: 07:00:00 via INTRAVENOUS

## 2016-06-10 MED ORDER — LIDOCAINE 2% (20 MG/ML) 5 ML SYRINGE
INTRAMUSCULAR | Status: AC
  Filled 2016-06-10: qty 5

## 2016-06-10 MED ORDER — SODIUM CHLORIDE 0.9 % IV SOLN: 250.0000 mL | INTRAVENOUS | Status: DC | PRN

## 2016-06-10 MED ORDER — CLOPIDOGREL BISULFATE 75 MG PO TABS
75.0000 mg | ORAL_TABLET | Freq: Every day | ORAL | Status: DC
  Administered 2016-06-11 – 2016-06-12 (×2): 75 mg via ORAL
  Filled 2016-06-10 (×2): qty 1

## 2016-06-10 MED ORDER — CHLORHEXIDINE GLUCONATE 4 % EX LIQD: 30.0000 mL | CUTANEOUS | Status: DC

## 2016-06-10 MED ORDER — IODIXANOL 320 MG/ML IV SOLN
INTRAVENOUS | Status: DC | PRN
  Administered 2016-06-10: 74.8 mL via INTRA_ARTERIAL

## 2016-06-10 MED ORDER — LACTATED RINGERS IV SOLN: 500.0000 mL | Freq: Once | INTRAVENOUS | Status: DC | PRN

## 2016-06-10 MED FILL — Potassium Chloride Inj 2 mEq/ML: INTRAVENOUS | Qty: 40 | Status: AC

## 2016-06-10 MED FILL — Magnesium Sulfate Inj 50%: INTRAMUSCULAR | Qty: 10 | Status: AC

## 2016-06-10 MED FILL — Heparin Sodium (Porcine) Inj 1000 Unit/ML: INTRAMUSCULAR | Qty: 30 | Status: AC

## 2016-06-10 SURGICAL SUPPLY — 98 items
ADAPTER UNIV SWAN GANZ BIP (ADAPTER) ×2 IMPLANT
ADAPTER UNV SWAN GANZ BIP (ADAPTER) ×1
ATTRACTOMAT 16X20 MAGNETIC DRP (DRAPES) IMPLANT
BAG BANDED W/RUBBER/TAPE 36X54 (MISCELLANEOUS) ×3 IMPLANT
BAG DECANTER FOR FLEXI CONT (MISCELLANEOUS) ×3 IMPLANT
BAG SNAP BAND KOVER 36X36 (MISCELLANEOUS) ×6 IMPLANT
BLADE 10 SAFETY STRL DISP (BLADE) ×3 IMPLANT
BLADE STERNUM SYSTEM 6 (BLADE) ×3 IMPLANT
BLADE SURG ROTATE 9660 (MISCELLANEOUS) ×3 IMPLANT
CABLE PACING FASLOC BIEGE (MISCELLANEOUS) ×3 IMPLANT
CABLE PACING FASLOC BLUE (MISCELLANEOUS) ×3 IMPLANT
CANISTER SUCTION 2500CC (MISCELLANEOUS) IMPLANT
CANNULA FEM VENOUS REMOTE 22FR (CANNULA) IMPLANT
CANNULA OPTISITE PERFUSION 16F (CANNULA) IMPLANT
CANNULA OPTISITE PERFUSION 18F (CANNULA) IMPLANT
CATH DIAG EXPO 6F VENT PIG 145 (CATHETERS) ×6 IMPLANT
CATH EXPO 5FR AL1 (CATHETERS) ×3 IMPLANT
CATH S G BIP PACING (SET/KITS/TRAYS/PACK) ×6 IMPLANT
CLIP TI MEDIUM 24 (CLIP) ×3 IMPLANT
CLIP TI WIDE RED SMALL 24 (CLIP) ×3 IMPLANT
CONT SPEC 4OZ CLIKSEAL STRL BL (MISCELLANEOUS) ×6 IMPLANT
COVER DOME SNAP 22 D (MISCELLANEOUS) ×3 IMPLANT
COVER MAYO STAND STRL (DRAPES) ×3 IMPLANT
COVER TABLE BACK 60X90 (DRAPES) ×3 IMPLANT
CRADLE DONUT ADULT HEAD (MISCELLANEOUS) ×3 IMPLANT
DERMABOND ADVANCED (GAUZE/BANDAGES/DRESSINGS) ×1
DERMABOND ADVANCED .7 DNX12 (GAUZE/BANDAGES/DRESSINGS) ×2 IMPLANT
DEVICE CLOSURE PERCLS PRGLD 6F (VASCULAR PRODUCTS) ×4 IMPLANT
DRAPE INCISE IOBAN 66X45 STRL (DRAPES) IMPLANT
DRAPE SLUSH MACHINE 52X66 (DRAPES) ×3 IMPLANT
DRAPE TABLE COVER HEAVY DUTY (DRAPES) ×3 IMPLANT
DRSG TEGADERM 4X4.75 (GAUZE/BANDAGES/DRESSINGS) ×6 IMPLANT
ELECT REM PT RETURN 9FT ADLT (ELECTROSURGICAL) ×6
ELECTRODE REM PT RTRN 9FT ADLT (ELECTROSURGICAL) ×4 IMPLANT
FELT TEFLON 6X6 (MISCELLANEOUS) ×3 IMPLANT
FEMORAL VENOUS CANN RAP (CANNULA) IMPLANT
GAUZE SPONGE 4X4 12PLY STRL (GAUZE/BANDAGES/DRESSINGS) ×3 IMPLANT
GLOVE BIO SURGEON STRL SZ7.5 (GLOVE) ×3 IMPLANT
GLOVE BIO SURGEON STRL SZ8 (GLOVE) ×6 IMPLANT
GLOVE EUDERMIC 7 POWDERFREE (GLOVE) ×3 IMPLANT
GLOVE ORTHO TXT STRL SZ7.5 (GLOVE) ×3 IMPLANT
GOWN STRL REUS W/ TWL LRG LVL3 (GOWN DISPOSABLE) ×6 IMPLANT
GOWN STRL REUS W/ TWL XL LVL3 (GOWN DISPOSABLE) ×12 IMPLANT
GOWN STRL REUS W/TWL LRG LVL3 (GOWN DISPOSABLE) ×3
GOWN STRL REUS W/TWL XL LVL3 (GOWN DISPOSABLE) ×6
GUIDEWIRE SAF TJ AMPL .035X180 (WIRE) ×3 IMPLANT
GUIDEWIRE SAFE TJ AMPLATZ EXST (WIRE) ×3 IMPLANT
GUIDEWIRE STRAIGHT .035 260CM (WIRE) ×3 IMPLANT
INSERT FOGARTY 61MM (MISCELLANEOUS) IMPLANT
INSERT FOGARTY SM (MISCELLANEOUS) IMPLANT
INSERT FOGARTY XLG (MISCELLANEOUS) IMPLANT
KIT BASIN OR (CUSTOM PROCEDURE TRAY) ×3 IMPLANT
KIT DILATOR VASC 18G NDL (KITS) IMPLANT
KIT HEART LEFT (KITS) ×3 IMPLANT
KIT ROOM TURNOVER OR (KITS) ×3 IMPLANT
KIT SUCTION CATH 14FR (SUCTIONS) IMPLANT
NEEDLE PERC 18GX7CM (NEEDLE) ×3 IMPLANT
NS IRRIG 1000ML POUR BTL (IV SOLUTION) ×18 IMPLANT
PACK AORTA (CUSTOM PROCEDURE TRAY) ×3 IMPLANT
PAD ARMBOARD 7.5X6 YLW CONV (MISCELLANEOUS) ×6 IMPLANT
PAD ELECT DEFIB RADIOL ZOLL (MISCELLANEOUS) ×3 IMPLANT
PATCH TACHOSII LRG 9.5X4.8 (VASCULAR PRODUCTS) IMPLANT
PERCLOSE PROGLIDE 6F (VASCULAR PRODUCTS) ×6
SET MICROPUNCTURE 5F STIFF (MISCELLANEOUS) ×3 IMPLANT
SHEATH AVANTI 11CM 8FR (MISCELLANEOUS) ×3 IMPLANT
SHEATH PINNACLE 6F 10CM (SHEATH) ×6 IMPLANT
SLEEVE REPOSITIONING LENGTH 30 (MISCELLANEOUS) ×3 IMPLANT
SPONGE GAUZE 4X4 12PLY STER LF (GAUZE/BANDAGES/DRESSINGS) ×6 IMPLANT
SPONGE LAP 4X18 X RAY DECT (DISPOSABLE) ×3 IMPLANT
STOPCOCK MORSE 400PSI 3WAY (MISCELLANEOUS) ×18 IMPLANT
SUT ETHIBOND X763 2 0 SH 1 (SUTURE) IMPLANT
SUT GORETEX CV 4 TH 22 36 (SUTURE) IMPLANT
SUT GORETEX CV4 TH-18 (SUTURE) IMPLANT
SUT GORETEX TH-18 36 INCH (SUTURE) IMPLANT
SUT MNCRL AB 3-0 PS2 18 (SUTURE) IMPLANT
SUT PROLENE 3 0 SH1 36 (SUTURE) IMPLANT
SUT PROLENE 4 0 RB 1 (SUTURE)
SUT PROLENE 4-0 RB1 .5 CRCL 36 (SUTURE) IMPLANT
SUT PROLENE 5 0 C 1 36 (SUTURE) IMPLANT
SUT PROLENE 6 0 C 1 30 (SUTURE) IMPLANT
SUT SILK  1 MH (SUTURE) ×1
SUT SILK 1 MH (SUTURE) ×2 IMPLANT
SUT SILK 2 0 SH CR/8 (SUTURE) IMPLANT
SUT VIC AB 2-0 CT1 27 (SUTURE)
SUT VIC AB 2-0 CT1 TAPERPNT 27 (SUTURE) IMPLANT
SUT VIC AB 2-0 CTX 36 (SUTURE) IMPLANT
SUT VIC AB 3-0 SH 8-18 (SUTURE) IMPLANT
SYR 30ML LL (SYRINGE) ×6 IMPLANT
SYR 50ML LL SCALE MARK (SYRINGE) ×3 IMPLANT
SYRINGE 10CC LL (SYRINGE) ×9 IMPLANT
TOWEL OR 17X26 10 PK STRL BLUE (TOWEL DISPOSABLE) ×6 IMPLANT
TRANSDUCER W/STOPCOCK (MISCELLANEOUS) ×6 IMPLANT
TRAY FOLEY IC TEMP SENS 14FR (CATHETERS) ×3 IMPLANT
TUBE SUCT INTRACARD DLP 20F (MISCELLANEOUS) IMPLANT
TUBING HIGH PRESSURE 120CM (CONNECTOR) ×3 IMPLANT
VALVE HEART TRANSCATH SZ3 26MM (Prosthesis & Implant Heart) ×3 IMPLANT
WIRE AMPLATZ SS-J .035X180CM (WIRE) ×3 IMPLANT
WIRE BENTSON .035X145CM (WIRE) ×3 IMPLANT

## 2016-06-10 NOTE — Plan of Care (Signed)
Problem: Activity: Goal: Risk for activity intolerance will decrease Outcome: Progressing Pt has tolerated sitting up in the chair for multiple hours as well as walking to the bathroom without difficulty.

## 2016-06-10 NOTE — Progress Notes (Signed)
  Echocardiogram Echocardiogram Transesophageal has been performed.  Ryan [name] 06/10/2016, 10:02 AM

## 2016-06-10 NOTE — Op Note (Signed)
[facility]   MULTIDISCIPLINARY HEART VALVE TEAM   TAVR OPERATIVE NOTE   Date of Procedure:  06/10/2016  Preoperative Diagnosis: Severe Aortic Stenosis   Postoperative Diagnosis: Same   Procedure:   Transcatheter Aortic Valve Replacement - Percutaneous Transfemoral Approach  Edwards Sapien 3 THV (size 26 mm, model # 9600TFX, serial # [id number])   Co-Surgeons:  Gilford Raid, MD and Sherren Mocha, MD  Anesthesiologist:  Oleta Mouse, MD  Echocardiographer:  Ena Dawley, MD  Pre-operative Echo Findings:  Severe aortic stenosis  Normal left ventricular systolic function  Post-operative Echo Findings:  Trivial paravalvular leak  Normal left ventricular systolic function    BRIEF CLINICAL NOTE AND INDICATIONS FOR SURGERY  83 year old gentleman with history of aortic stenosis, coronary artery disease, hypertension, cerebrovascular disease, peripheral vascular disease, and hyperlipidemia who was evaluated in August 2017 for progressive symptoms of exertional shortness of breath and burning pain in his upper chest and throat. His last echo in May 2017 showed moderate AS with a mean gradient of 25 mm Hg and a peak velocity ratio of 0.22 with an LVEF of 60-65%. He underwent cath on 01/17/2016 showing a mean aortic transvalvular gradient of 25 mm Hg and a calculated AVA of 1.04 cm2. Coronary angiography revealed multivessel coronary artery disease with an eccentric 80% stenosis of the mid right coronary artery, 80% ostial stenosis of a large first obtuse marginal branch and diffuse nonobstructive disease in the LAD. Medical therapy was recommended but he continued to have progressive exertional shortness of breath and pain in the upper chest and neck. He was seen by Dr. Roxy Manns for surgical evaluation and he was concerned about the RCA stenosis and felt that pre-op PCI was indicated. The patient was subsequently admitted with new onset atrial fibrillation and tachybrady  syndrome and underwent implantation of a dual chamber pacemaker by Dr. Lovena Le on 03/21/2016. He then underwent PCI with rotational atherectomy and DES of the RCA on 04/30/2016 and was started on ASA, Plavix and Eliquis with plans for staged TAVR in 4 weeks. He has held Eliquis x 5 days as per his instruction, has discontinued ASA, and has remained on plavix 75 mg daily. He presents today for TAVR.  During the course of the patient's preoperative work up they have been evaluated comprehensively by a multidisciplinary team of specialists coordinated through the [facility] in the [facility].  They have been demonstrated to suffer from symptomatic severe aortic stenosis as noted above. The patient has been counseled extensively as to the relative risks and benefits of all options for the treatment of severe aortic stenosis including long term medical therapy, conventional surgery for aortic valve replacement, and transcatheter aortic valve replacement.  The patient has been independently evaluated by two cardiac surgeons including Dr. Roxy Manns and Dr. Cyndia Bent, and they are felt to be at moderate risk for conventional surgical aortic valve replacement. Based upon review of all of the patient's preoperative diagnostic tests they are felt to be candidate for transcatheter aortic valve replacement using the transfemoral approach as an alternative to high risk conventional surgery.    Following the decision to proceed with transcatheter aortic valve replacement, a discussion has been held regarding what types of management strategies would be attempted intraoperatively in the event of life-threatening complications, including whether or not the patient would be considered a candidate for the use of cardiopulmonary bypass and/or conversion to open sternotomy for attempted surgical intervention.  The patient has been  advised of a variety of complications that might develop  peculiar to this approach including but not limited to risks of death, stroke, paravalvular leak, aortic dissection or other major vascular complications, aortic annulus rupture, device embolization, cardiac rupture or perforation, acute myocardial infarction, arrhythmia, heart block or bradycardia requiring permanent pacemaker placement, congestive heart failure, respiratory failure, renal failure, pneumonia, infection, other late complications related to structural valve deterioration or migration, or other complications that might ultimately cause a temporary or permanent loss of functional independence or other long term morbidity.  The patient provides full informed consent for the procedure as described and all questions were answered preoperatively.    DETAILS OF THE OPERATIVE PROCEDURE  PREPARATION:    The patient is brought to the operating room on the above mentioned date and central monitoring was established by the anesthesia team including placement of Swan-Ganz catheter and radial arterial line. The patient is placed in the supine position on the operating table.  Intravenous antibiotics are administered.  General endotracheal anesthesia is induced uneventfully.  A Foley catheter is placed.  Baseline transesophageal echocardiogram was performed. The patient's chest, abdomen, both groins, and both lower extremities are prepared and draped in a sterile manner. A time out procedure is performed.   PERIPHERAL ACCESS:    Using the modified Seldinger technique, femoral arterial and venous access was obtained with placement of 6 Fr sheaths on the left side.  A pigtail diagnostic catheter was passed through the left femoral arterial sheath under fluoroscopic guidance into the aortic root.  A temporary transvenous pacemaker catheter was passed through the left femoral venous sheath under fluoroscopic guidance into the right ventricle.  The pacemaker was tested to ensure stable lead placement and  pacemaker capture. Aortic root angiography was performed in order to determine the optimal angiographic angle for valve deployment.   TRANSFEMORAL ACCESS:  A micropuncture technique is used to access the right femoral artery under fluoroscopic guidance. 2 Perclose devices are deployed at 10' and 2' positions to 'PreClose' the femoral artery. An 8 French sheath is placed and then an Amplatz Superstiff wire is advanced through the sheath. This is changed out for a 14 French transfemoral E-Sheath after progressively dilating over the Superstiff wire.  An AL-2 catheter was used to direct a straight-tip exchange length wire across the native aortic valve into the left ventricle. This was exchanged out for a pigtail catheter and position was confirmed in the LV apex. Simultaneous LV and Ao pressures were recorded.  The pigtail catheter was exchanged for an Amplatz Extra-stiff wire in the LV apex.  Echocardiography was utilized to confirm appropriate wire position and no sign of entanglement in the mitral subvalvular apparatus.  TRANSCATHETER HEART VALVE DEPLOYMENT:  An Edwards Sapien 3 transcatheter heart valve (size 26 mm, model #9600TFX, serial # [id number]) was prepared and crimped per manufacturer's guidelines, and the proper orientation of the valve is confirmed on the [employer] delivery system. The valve was advanced through the introducer sheath using normal technique until in an appropriate position in the abdominal aorta beyond the sheath tip. The balloon was then retracted and using the fine-tuning wheel was centered on the valve. The valve was then advanced across the aortic arch using appropriate flexion of the catheter. The valve was carefully positioned across the aortic valve annulus. The Commander catheter was retracted using normal technique. Once final position of the valve has been confirmed by angiographic assessment, the valve is deployed while temporarily holding ventilation and during  rapid  ventricular pacing to maintain systolic blood pressure < 50 mmHg and pulse pressure < 10 mmHg. The balloon inflation is held for >3 seconds after reaching full deployment volume. Once the balloon has fully deflated the balloon is retracted into the ascending aorta and valve function is assessed using echocardiography. There is felt to be mild paravalvular leak and no central aortic insufficiency.  The patient's hemodynamic recovery following valve deployment is good.  The deployment balloon and guidewire are both removed. Echo demostrated acceptable post-procedural gradients, stable mitral valve function, and mild aortic insufficiency. Aortography confirmed no greater than mild aortic insufficiency.    PROCEDURE COMPLETION:  The sheath was removed and femoral artery closure is performed using the 2 previously deployed Perclose devices.  Protamine was administered once femoral arterial repair was complete. The temporary pacemaker, pigtail catheters and femoral sheaths were removed with manual pressure used for hemostasis.   The patient tolerated the procedure well and is transported to the surgical intensive care in stable condition. There were no immediate intraoperative complications. All sponge instrument and needle counts are verified correct at completion of the operation.   The patient received a total of 75 mL of intravenous contrast during the procedure.   Sherren Mocha, MD 06/10/2016 11:03 AM

## 2016-06-10 NOTE — Plan of Care (Signed)
Problem: Nutrition: Goal: Adequate nutrition will be maintained Outcome: Progressing Pt tolerated a cardiac diet today without difficulty.

## 2016-06-10 NOTE — Op Note (Signed)
[facility]   MULTIDISCIPLINARY HEART VALVE TEAM   TAVR OPERATIVE NOTE   Date of Procedure:  06/10/2016  Preoperative Diagnosis: Severe Aortic Stenosis   Postoperative Diagnosis: Same   Procedure:    Transcatheter Aortic Valve Replacement - Percutaneous Right Transfemoral Approach  Edwards Sapien 3 THV (size 26 mm, model # 9600TFX, serial # [id number])   Co-Surgeons:  Gaye Pollack, MD and Sherren Mocha, MD   Anesthesiologist:  Oleta Mouse, MD  Echocardiographer:  Ena Dawley, MD  Pre-operative Echo Findings:   severe aortic stenosis   Normal left ventricular systolic function   Post-operative Echo Findings:  Trivial paravalvular leak   Normal left ventricular systolic function   BRIEF CLINICAL NOTE AND INDICATIONS FOR SURGERY  The patient is an 83 year old gentleman with history of aortic stenosis, coronary artery disease, hypertension, cerebrovascular disease, peripheral vascular disease, and hyperlipidemia who was evaluated in August 2017 for progressive symptoms of exertional shortness of breath and burning pain in his upper chest and throat. His last echo in May 2017 showed moderate AS with a mean gradient of 25 mm Hg and a peak velocity ratio of 0.22 with an LVEF of 60-65%. He underwent cath on 01/17/2016 showing a mean aortic transvalvular gradient of 25 mm Hg and a calculated AVA of 1.04 cm2. Coronary angiography revealed multivessel coronary artery disease with aneccentric 80% stenosis of the mid right coronary artery, 80% ostial stenosis of a large first obtuse marginal branch and diffuse nonobstructive disease in the LAD. Medical therapy was recommended but he continued to have progressive exertional shortness of breath and pain in the upper chest and neck. He was seen by Dr. Roxy Manns for surgical evaluation and he was concerned about the RCA stenosis and felt that pre-op PCI was indicated. The patient was subsequently admitted with new onset  atrial fibrillation and tachybrady syndrome and underwent implantation of a dual chamber pacemaker by Dr. Lovena Le on 03/21/2016. He then underwent PCI with rotational atherectomy and DES of the RCA on 04/30/2016 and was started on ASA, Plavix and Eliquis with plans for staged TAVR in 4 weeks.  He has stage D severe, symptomatic aortic stenosis and multivessel CAD s/p atherectomy and DES of the mid RCA 0n 04/30/2016. His chest discomfort has resolved but he continues to have exertional shortness of breath and fatigue with dizziness at low levels of activity, NYHA class III. I have personally reviewed his echo, cath and CT studies. His mean aortic valve gradient was only 25 by echo last May but the DI was 0.22 and his valve was severely calcified with restricted leaflet mobility and looked like a severely stenotic valve. His mean gradient by cath was 25.1 with an AVA of 1.04. I think he has low gradient, normal LVEF, severe AS and AVR is indicated. His cath shows 3-vessel CAD but the most concerning stenosis in the RCA has been treated with PCI and the other lesions can be managed medically for now. He is in the intermediate risk category for open surgical AVR and I think TAVR is a reasonable alternative treatment for him, especially given his age and degenerative arthritis that would make recovery from open surgery much slower.  His gated cardiac CT shows anatomy favorable for TAVR using a Sapien 3 valve. His abdominal and pelvic CT shows adequate pelvic arterial anatomy for transfemoral access.  The patient and his wife were counseled at length regarding treatment alternatives for management of severe symptomatic aortic stenosis. Alternative approaches such as conventional aortic  valve replacement, transcatheter aortic valve replacement, and palliative medical therapy were compared and contrasted at length. The risks associated with conventional surgical aortic valve replacement were been discussed in  detail, as were expectations for post-operative convalescence. Long-term prognosis with medical therapy was discussed. I discussed transcatheter aortic valve replacement, what types of management strategies would be attempted intraoperatively in the event of life-threatening complications, including whether or not the patient would be considered a candidate for the use of cardiopulmonary bypass and/or conversion to open sternotomy for attempted surgical intervention. The patient has been advised of a variety of complications that might develop including but not limited to risks of death, stroke, paravalvular leak, aortic dissection or other major vascular complications, aortic annulus rupture, device embolization, cardiac rupture or perforation, mitral regurgitation, acute myocardial infarction, arrhythmia, heart block or bradycardia requiring permanent pacemaker placement, congestive heart failure, respiratory failure, renal failure, pneumonia, infection, other late complications related to structural valve deterioration or migration, or other complications that might ultimately cause a temporary or permanent loss of functional independence or other long term morbidity. The patient provides full informed consent for the procedure as described and all questions were answered.   DETAILS OF THE OPERATIVE PROCEDURE  PREPARATION:    The patient is brought to the operating room on the above mentioned date and central monitoring was established by the anesthesia team including placement of a radial arterial line. The patient is placed in the supine position on the operating table.  Intravenous antibiotics are administered. General endotracheal anesthesia is induced uneventfully.  A Foley catheter is placed.  Baseline transesophageal echocardiogram was performed. The patient's chest, abdomen, both groins, and both lower extremities are prepared and draped in a sterile manner. A time out procedure is  performed.   Peripheral and transfemoral access was performed by Dr. Burt Knack and will be dictated in his note.    BALLOON AORTIC VALVULOPLASTY:   Not performed  TRANSCATHETER HEART VALVE DEPLOYMENT:   An Edwards Sapien 3 transcatheter heart valve (size 26 mm, model #9600TFX, serial #[id number]) was prepared and crimped per manufacturer's guidelines, and the proper orientation of the valve is confirmed on the [employer] delivery system. The valve was advanced through the introducer sheath using normal technique until in an appropriate position in the abdominal aorta beyond the sheath tip. The balloon was then retracted and using the fine-tuning wheel was centered on the valve. The valve was then advanced across the aortic arch using appropriate flexion of the catheter. The valve was carefully positioned across the aortic valve annulus. The Commander catheter was retracted using normal technique. Once final position of the valve has been confirmed by angiographic assessment, the valve is deployed while temporarily holding ventilation and during rapid ventricular pacing to maintain systolic blood pressure < 50 mmHg and pulse pressure < 10 mmHg. The balloon inflation is held for >3 seconds after reaching full deployment volume. Once the balloon has fully deflated the balloon is retracted into the ascending aorta and valve function is assessed using echocardiography. There is felt to be trivial paravalvular leak and no central aortic insufficiency.  The patient's hemodynamic recovery following valve deployment is good.  The deployment balloon and guidewire are both removed. Echo demostrated acceptable post-procedural gradients, stable mitral valve function, and trivial aortic insufficiency.     PROCEDURE COMPLETION:   The sheath was removed and femoral artery closure performed by Dr Burt Knack. Please see his separate report for details.  Protamine was administered once femoral arterial repair was  complete.  The temporary pacemaker, pigtail catheters and femoral sheaths were removed with manual pressure used for hemostasis.   The patient tolerated the procedure well and is transported to the surgical intensive care in stable condition. There were no immediate intraoperative complications. All sponge instrument and needle counts are verified correct at completion of the operation.   No blood products were administered during the operation.  The patient received a total of 75 mL of intravenous contrast during the procedure.   Gaye Pollack, MD 06/10/2016 5:37 PM

## 2016-06-10 NOTE — Interval H&P Note (Signed)
History and Physical Interval Note:  06/10/2016 5:55 AM  Ryan [name]  has presented today for surgery, with the diagnosis of SEVERE AS  The various methods of treatment have been discussed with the patient and family. After consideration of risks, benefits and other options for treatment, the patient has consented to  Procedure(s): TRANSCATHETER AORTIC VALVE REPLACEMENT, TRANSFEMORAL (N/A) TRANSESOPHAGEAL ECHOCARDIOGRAM (TEE) (N/A) as a surgical intervention .  The patient's history has been reviewed, patient examined, no change in status, stable for surgery.  I have reviewed the patient's chart and labs.  Questions were answered to the patient's satisfaction.     Gaye Pollack

## 2016-06-10 NOTE — Anesthesia Procedure Notes (Signed)
Procedure Name: Intubation Date/Time: 06/10/2016 7:49 AM Performed by: Rebekah Chesterfield L Pre-anesthesia Checklist: Patient identified, Emergency Drugs available, Suction available and Patient being monitored Patient Re-evaluated:Patient Re-evaluated prior to inductionOxygen Delivery Method: Circle System Utilized Preoxygenation: Pre-oxygenation with 100% oxygen Intubation Type: IV induction Ventilation: Mask ventilation without difficulty Laryngoscope Size: Miller and 2 Grade View: Grade I Tube type: Oral Tube size: 7.5 mm Number of attempts: 1 Airway Equipment and Method: Stylet and Oral airway Placement Confirmation: ETT inserted through vocal cords under direct vision,  positive ETCO2 and breath sounds checked- equal and bilateral Secured at: 20 cm Tube secured with: Tape Dental Injury: Teeth and Oropharynx as per pre-operative assessment

## 2016-06-10 NOTE — Anesthesia Preprocedure Evaluation (Signed)
Anesthesia Evaluation  Patient identified by MRN, date of birth, ID band Patient awake    Reviewed: Allergy & Precautions, NPO status , Patient's Chart, lab work & pertinent test results  History of Anesthesia Complications Negative for: history of anesthetic complications  Airway Mallampati: II  TM Distance: >3 FB Neck ROM: Full    Dental  (+) Teeth Intact   Pulmonary shortness of breath, asthma , sleep apnea ,    breath sounds clear to auscultation       Cardiovascular hypertension, Pt. on medications and Pt. on home beta blockers + angina + CAD, + Peripheral Vascular Disease, +CHF and + DOE  + pacemaker  Rhythm:Regular + Systolic murmurs    Neuro/Psych PSYCHIATRIC DISORDERS Depression negative neurological ROS     GI/Hepatic Neg liver ROS, hiatal hernia, GERD  Controlled,  Endo/Other  negative endocrine ROS  Renal/GU negative Renal ROS     Musculoskeletal  (+) Arthritis ,   Abdominal   Peds  Hematology negative hematology ROS (+)   Anesthesia Other Findings   Reproductive/Obstetrics                             Anesthesia Physical Anesthesia Plan  ASA: III  Anesthesia Plan: General   Post-op Pain Management:    Induction: Intravenous  Airway Management Planned: Oral ETT  Additional Equipment: Arterial line and TEE  Intra-op Plan:   Post-operative Plan: Extubation in OR  Informed Consent: I have reviewed the patients History and Physical, chart, labs and discussed the procedure including the risks, benefits and alternatives for the proposed anesthesia with the patient or authorized representative who has indicated his/her understanding and acceptance.   Dental advisory given  Plan Discussed with: CRNA and Surgeon  Anesthesia Plan Comments:         Anesthesia Quick Evaluation

## 2016-06-10 NOTE — Transfer of Care (Signed)
Immediate Anesthesia Transfer of Care Note  Patient: Ryan [name]  Procedure(s) Performed: Procedure(s): TRANSCATHETER AORTIC VALVE REPLACEMENT, TRANSFEMORAL (N/A) TRANSESOPHAGEAL ECHOCARDIOGRAM (TEE) (N/A)  Patient Location: SICU  Anesthesia Type:General  Level of Consciousness: awake, alert , oriented and patient cooperative  Airway & Oxygen Therapy: Patient Spontanous Breathing and Patient connected to nasal cannula oxygen  Post-op Assessment: Report given to RN, Post -op Vital signs reviewed and stable and Patient moving all extremities  Post vital signs: Reviewed and stable  Last Vitals:  Vitals:   06/10/16 0559  BP: (!) 192/65  Pulse: 76  Resp: 20  Temp: 36.4 C    Last Pain:  Vitals:   06/10/16 0559  TempSrc: Oral         Complications: No apparent anesthesia complications

## 2016-06-10 NOTE — Anesthesia Procedure Notes (Signed)
Central Venous Catheter Insertion Performed by: Oleta Mouse, anesthesiologist Start/End1/01/2017 7:18 AM, 06/10/2016 7:24 AM Patient location: Pre-op. Preanesthetic checklist: patient identified, IV checked, site marked, risks and benefits discussed, surgical consent, monitors and equipment checked, pre-op evaluation, timeout performed and anesthesia consent Lidocaine 1% used for infiltration and patient sedated Hand hygiene performed  and maximum sterile barriers used  Catheter size: 8 Fr Total catheter length 16. Central line was placed.Double lumen Procedure performed using ultrasound guided technique. Ultrasound Notes:anatomy identified, needle tip was noted to be adjacent to the nerve/plexus identified, no ultrasound evidence of intravascular and/or intraneural injection and image(s) printed for medical record Attempts: 1 Following insertion, dressing applied, line sutured and Biopatch. Post procedure assessment: blood return through all ports, free fluid flow and no air  Patient tolerated the procedure well with no immediate complications.

## 2016-06-11 ENCOUNTER — Inpatient Hospital Stay (HOSPITAL_COMMUNITY): Payer: Medicare Other

## 2016-06-11 ENCOUNTER — Encounter (HOSPITAL_COMMUNITY): Payer: Self-pay | Admitting: Cardiovascular Disease

## 2016-06-11 DIAGNOSIS — I700 Atherosclerosis of aorta: Secondary | ICD-10-CM | POA: Diagnosis not present

## 2016-06-11 DIAGNOSIS — I350 Nonrheumatic aortic (valve) stenosis: Secondary | ICD-10-CM

## 2016-06-11 DIAGNOSIS — Z954 Presence of other heart-valve replacement: Secondary | ICD-10-CM

## 2016-06-11 LAB — BASIC METABOLIC PANEL
Anion gap: 7 (ref 5–15)
BUN: 22 mg/dL — AB (ref 6–20)
CALCIUM: 8.6 mg/dL — AB (ref 8.9–10.3)
CO2: 24 mmol/L (ref 22–32)
CREATININE: 1.29 mg/dL — AB (ref 0.61–1.24)
Chloride: 107 mmol/L (ref 101–111)
GFR calc Af Amer: 57 mL/min — ABNORMAL LOW (ref >60)
GFR, EST NON AFRICAN AMERICAN: 49 mL/min — AB (ref >60)
GLUCOSE: 112 mg/dL — AB (ref 65–99)
Potassium: 4 mmol/L (ref 3.5–5.1)
SODIUM: 138 mmol/L (ref 135–145)

## 2016-06-11 LAB — CBC
HCT: 33.6 % — ABNORMAL LOW (ref 39.0–52.0)
Hemoglobin: 11.1 g/dL — ABNORMAL LOW (ref 13.0–17.0)
MCH: 29.9 pg (ref 26.0–34.0)
MCHC: 33 g/dL (ref 30.0–36.0)
MCV: 90.6 fL (ref 78.0–100.0)
PLATELETS: 189 10*3/uL (ref 150–400)
RBC: 3.71 MIL/uL — ABNORMAL LOW (ref 4.22–5.81)
RDW: 13.9 % (ref 11.5–15.5)
WBC: 5.5 10*3/uL (ref 4.0–10.5)

## 2016-06-11 LAB — ECHOCARDIOGRAM COMPLETE
HEIGHTINCHES: 66 in
WEIGHTICAEL: 2172.85 [oz_av]

## 2016-06-11 LAB — MAGNESIUM: Magnesium: 2.3 mg/dL (ref 1.7–2.4)

## 2016-06-11 MED ORDER — FAMOTIDINE 20 MG PO TABS
20.0000 mg | ORAL_TABLET | Freq: Two times a day (BID) | ORAL | Status: DC
  Administered 2016-06-12: 20 mg via ORAL
  Filled 2016-06-11 (×2): qty 1

## 2016-06-11 MED ORDER — SODIUM CHLORIDE 0.9 % IV SOLN: 250.0000 mL | INTRAVENOUS | Status: DC | PRN

## 2016-06-11 MED ORDER — TRAMADOL HCL 50 MG PO TABS: 50.0000 mg | ORAL_TABLET | ORAL | Status: DC | PRN

## 2016-06-11 MED ORDER — ALBUTEROL SULFATE (2.5 MG/3ML) 0.083% IN NEBU: 2.5000 mg | INHALATION_SOLUTION | Freq: Four times a day (QID) | RESPIRATORY_TRACT | Status: DC | PRN

## 2016-06-11 MED ORDER — ONDANSETRON HCL 4 MG/2ML IJ SOLN: 4.0000 mg | Freq: Four times a day (QID) | INTRAMUSCULAR | Status: DC | PRN

## 2016-06-11 MED ORDER — BISACODYL 5 MG PO TBEC: 10.0000 mg | DELAYED_RELEASE_TABLET | Freq: Every day | ORAL | Status: DC | PRN

## 2016-06-11 MED ORDER — MOVING RIGHT ALONG BOOK
Freq: Once | Status: AC
  Administered 2016-06-11: 17:00:00
  Filled 2016-06-11: qty 1

## 2016-06-11 MED ORDER — DOCUSATE SODIUM 100 MG PO CAPS
200.0000 mg | ORAL_CAPSULE | Freq: Every day | ORAL | Status: DC
  Administered 2016-06-12: 200 mg via ORAL
  Filled 2016-06-11: qty 2

## 2016-06-11 MED ORDER — ONDANSETRON HCL 4 MG PO TABS: 4.0000 mg | ORAL_TABLET | Freq: Four times a day (QID) | ORAL | Status: DC | PRN

## 2016-06-11 MED ORDER — APIXABAN 2.5 MG PO TABS
2.5000 mg | ORAL_TABLET | Freq: Two times a day (BID) | ORAL | Status: DC
  Administered 2016-06-12: 2.5 mg via ORAL
  Filled 2016-06-11: qty 1

## 2016-06-11 MED ORDER — ACETAMINOPHEN 325 MG PO TABS: 650.0000 mg | ORAL_TABLET | Freq: Four times a day (QID) | ORAL | Status: DC | PRN

## 2016-06-11 MED ORDER — SODIUM CHLORIDE 0.9% FLUSH
3.0000 mL | Freq: Two times a day (BID) | INTRAVENOUS | Status: DC
  Administered 2016-06-12: 3 mL via INTRAVENOUS

## 2016-06-11 MED ORDER — LATANOPROST 0.005 % OP SOLN
1.0000 [drp] | Freq: Every day | OPHTHALMIC | Status: DC
  Filled 2016-06-11: qty 2.5

## 2016-06-11 MED ORDER — SODIUM CHLORIDE 0.9% FLUSH: 3.0000 mL | INTRAVENOUS | Status: DC | PRN

## 2016-06-11 MED ORDER — BISACODYL 10 MG RE SUPP: 10.0000 mg | Freq: Every day | RECTAL | Status: DC | PRN

## 2016-06-11 NOTE — Anesthesia Postprocedure Evaluation (Addendum)
Anesthesia Post Note  Patient: Ryan [name]  Procedure(s) Performed: Procedure(s) (LRB): TRANSCATHETER AORTIC VALVE REPLACEMENT, TRANSFEMORAL (N/A) TRANSESOPHAGEAL ECHOCARDIOGRAM (TEE) (N/A)  Patient location during evaluation: ICU Anesthesia Type: General Level of consciousness: awake Pain management: pain level controlled Vital Signs Assessment: post-procedure vital signs reviewed and stable Respiratory status: spontaneous breathing, respiratory function stable and patient connected to nasal cannula oxygen Cardiovascular status: stable Postop Assessment: no signs of nausea or vomiting Anesthetic complications: no       Last Vitals:  Vitals:   06/11/16 0600 06/11/16 0700  BP: (!) 95/45 100/72  Pulse: 62 60  Resp: (!) 22 (!) 21  Temp:  37.1 C    Last Pain:  Vitals:   06/11/16 0745  TempSrc:   PainSc: 0-No pain                 Haizlee Henton

## 2016-06-11 NOTE — Progress Notes (Signed)
    Subjective:  Feels well. He walked around the unit 4 times this morning. No complaints.  Objective:  Vital Signs in the last 24 hours: Temp:  [97.3 F (36.3 C)-98.9 F (37.2 C)] 98.7 F (37.1 C) (01/10 0700) Pulse Rate:  [0-162] 60 (01/10 0700) Resp:  [10-24] 21 (01/10 0700) BP: (90-170)/(45-83) 100/72 (01/10 0700) SpO2:  [87 %-99 %] 94 % (01/10 0700) Arterial Line BP: (104-167)/(43-60) 148/54 (01/09 1445) Weight:  [135 lb 12.9 oz (61.6 kg)] 135 lb 12.9 oz (61.6 kg) (01/10 0300)  Intake/Output from previous day: 01/09 0701 - 01/10 0700 In: 2443.2 [P.O.:720; I.V.:1173.2; IV Piggyback:550] Out: 1265 [Urine:1215; Blood:50]  Physical Exam: Pt is alert and oriented, pleasant elderly male in NAD HEENT: normal Neck: JVP - normal Lungs: CTA bilaterally CV: RRR 2/6 systolic ejection murmur at the right upper sternal border Abd: soft, NT, Positive BS, no hepatomegaly Ext: no C/C/E, distal pulses intact and equal, bilateral groin sites are clear Skin: warm/dry no rash   Lab Results:  Recent Labs  06/10/16 1030 06/10/16 1039 06/11/16 0355  WBC 4.7  --  5.5  HGB 10.8* 10.9*  10.9* 11.1*  PLT 190  --  189    Recent Labs  06/10/16 0932 06/10/16 1039 06/11/16 0355  NA 139 141  141 138  K 4.4 4.0  4.0 4.0  CL 106  --  107  CO2  --   --  24  GLUCOSE 116* 99  99 112*  BUN 21*  --  22*  CREATININE 1.10  --  1.29*   No results for input(s): TROPONINI in the last 72 hours.  Invalid input(s): CK, MB  Cardiac Studies: Postoperative day #1 echo is pending  Tele: Paced rhythm  Assessment/Plan:  1. Severe aortic stenosis status post TAVR: Patient with good early recovery. He will be transferred to 2 W. today. Anticipate discharge home tomorrow. Postoperative day #1 echo to be done today.  2. Coronary artery disease, native vessel: The patient with recent PCI. He will continue on clopidogrel. He is having no anginal symptoms.  3. Paroxysmal atrial fibrillation:  Plan to resume Eliquis tomorrow morning prior to discharge. Anticipate strategy of Eliquis and clopidogrel without aspirin. He continues on amiodarone.  Otherwise plans as per Dr Cyndia Bent.   Sherren Mocha, M.D. 06/11/2016, 9:40 AM

## 2016-06-11 NOTE — Significant Event (Signed)
Patient transferred safely to 2W36, ambulated there with RN and spouse. VS stable prior and during the transfer. No personal belongings at bedside of 2S16 that could be transferred to new room. Report given to Benjamin. Patient settled in chair per his request. Staff in room prior to RN leaving.        Shiquita Collignon

## 2016-06-11 NOTE — Care Management Note (Signed)
Case Management Note  Patient Details  Name: Ryan [name] MRN: [id number] Date of Birth: 02-08-1930  Subjective/Objective:      Pt is s/p TRANSCATHETER AORTIC VALVE REPLACEMENT,   Action/Plan:  PTA independent from home with wife.  CM will continue to follow for discharge needs   Expected Discharge Date:                  Expected Discharge Plan:  Home/Self Care  In-House Referral:     Discharge planning Services  CM Consult  Post Acute Care Choice:    Choice offered to:     DME Arranged:    DME Agency:     HH Arranged:    HH Agency:     Status of Service:  In process, will continue to follow  If discussed at Long Length of Stay Meetings, dates discussed:    Additional Comments:  Maryclare Labrador, RN 06/11/2016, 10:00 AM

## 2016-06-11 NOTE — Progress Notes (Signed)
  Echocardiogram 2D Echocardiogram has been performed.  Tam Delisle 06/11/2016, 10:57 AM

## 2016-06-11 NOTE — Progress Notes (Signed)
1 Day Post-Op Procedure(s) (LRB): TRANSCATHETER AORTIC VALVE REPLACEMENT, TRANSFEMORAL (N/A) TRANSESOPHAGEAL ECHOCARDIOGRAM (TEE) (N/A) Subjective:  No complaints. Walked already  Objective: Vital signs in last 24 hours: Temp:  [97.3 F (36.3 C)-98.9 F (37.2 C)] 98.7 F (37.1 C) (01/10 0700) Pulse Rate:  [0-162] 60 (01/10 0700) Cardiac Rhythm: Atrial paced (01/10 0745) Resp:  [10-24] 21 (01/10 0700) BP: (90-170)/(45-83) 100/72 (01/10 0700) SpO2:  [87 %-99 %] 94 % (01/10 0700) Arterial Line BP: (104-167)/(43-60) 148/54 (01/09 1445) Weight:  [61.6 kg (135 lb 12.9 oz)] 61.6 kg (135 lb 12.9 oz) (01/10 0300)  Hemodynamic parameters for last 24 hours:    Intake/Output from previous day: 01/09 0701 - 01/10 0700 In: 2443.2 [P.O.:720; I.V.:1173.2; IV Piggyback:550] Out: 4132 [Urine:1215; Blood:50] Intake/Output this shift: No intake/output data recorded.  General appearance: alert and cooperative Neurologic: intact Heart: regular rate and rhythm, S1, S2 normal, no murmur, click, rub or gallop Lungs: clear to auscultation bilaterally Extremities: extremities normal, atraumatic, no cyanosis or edema Wound: groin sites ok  Lab Results:  Recent Labs  06/10/16 1030 06/10/16 1039 06/11/16 0355  WBC 4.7  --  5.5  HGB 10.8* 10.9*  10.9* 11.1*  HCT 33.1* 32.0*  32.0* 33.6*  PLT 190  --  189   BMET:  Recent Labs  06/10/16 0932 06/10/16 1039 06/11/16 0355  NA 139 141  141 138  K 4.4 4.0  4.0 4.0  CL 106  --  107  CO2  --   --  24  GLUCOSE 116* 99  99 112*  BUN 21*  --  22*  CREATININE 1.10  --  1.29*  CALCIUM  --   --  8.6*    PT/INR:  Recent Labs  06/10/16 1030  LABPROT 15.9*  INR 1.26   ABG    Component Value Date/Time   PHART 7.460 (H) 06/10/2016 1036   PHART 7.460 (H) 06/10/2016 1036   HCO3 22.2 06/10/2016 1036   HCO3 22.2 06/10/2016 1036   TCO2 23 06/10/2016 1036   TCO2 23 06/10/2016 1036   ACIDBASEDEF 1.0 06/10/2016 1036   ACIDBASEDEF 1.0  06/10/2016 1036   O2SAT 96.0 06/10/2016 1036   O2SAT 96.0 06/10/2016 1036   CBG (last 3)  No results for input(s): GLUCAP in the last 72 hours.  CXR: clear  ECG: atrial paced 60, LBBB  Assessment/Plan: S/P Procedure(s) (LRB): TRANSCATHETER AORTIC VALVE REPLACEMENT, TRANSFEMORAL (N/A) TRANSESOPHAGEAL ECHOCARDIOGRAM (TEE) (N/A)  He is hemodynamically stable in A-paced rhythm at 60 by PPM.  Continue Plavix and plan to restart Eliquis tomorrow with coronary stent. No aspirin.  2D echo today.  Remove central line.  Mobilize today and plan home tomorrow if no changes.  Transfer to 2W .      LOS: 1 day    Gaye Pollack 06/11/2016

## 2016-06-12 DIAGNOSIS — Z954 Presence of other heart-valve replacement: Secondary | ICD-10-CM

## 2016-06-12 DIAGNOSIS — I350 Nonrheumatic aortic (valve) stenosis: Secondary | ICD-10-CM

## 2016-06-12 MED ORDER — LISINOPRIL 10 MG PO TABS
10.0000 mg | ORAL_TABLET | Freq: Every day | ORAL | Status: DC
  Administered 2016-06-12: 10 mg via ORAL
  Filled 2016-06-12: qty 1

## 2016-06-12 NOTE — Care Management Note (Signed)
Case Management Note Previous CM note initiated by Maryclare Labrador, RN 06/11/2016, 10:00 AM   Patient Details  Name: Ryan [name] MRN: [id number] Date of Birth: [birth year]  Subjective/Objective:      Pt is s/p TRANSCATHETER AORTIC VALVE REPLACEMENT,   Action/Plan:  PTA independent from home with wife.  CM will continue to follow for discharge needs   Expected Discharge Date:    06/12/16              Expected Discharge Plan:  Home/Self Care  In-House Referral:     Discharge planning Services  CM Consult  Post Acute Care Choice:    Choice offered to:     DME Arranged:    DME Agency:     HH Arranged:    [place] Agency:     Status of Service:  Completed, signed off  If discussed at [employer] of Stay Meetings, dates discussed:    Discharge Disposition: home/self care   Additional Comments:  06/12/16- [place] RN, CM- pt for d/c home today with spouse- no CM needs noted for discharge.   Dawayne Patricia, RN 06/12/2016, 11:50 AM

## 2016-06-12 NOTE — Progress Notes (Addendum)
CARDIAC REHAB PHASE I   PRE:  Rate/Rhythm: 60 paced  BP:  Sitting: 132/47        SaO2: 97 RA  MODE:  Ambulation: 540 ft   POST:  Rate/Rhythm: 77  BP:  Sitting: 155/48         SaO2: 97 RA  Pt ambulated 540 ft on RA, hand held assist, steady gait, tolerated well. Pt c/o increased fatigue with distance, declined rest stop, denies any other complaints. Cardiac surgery discharge education completed with pt and wife at bedside. Reviewed IS, activity progression/restrictions, exercise, heart healthy diet and phase 2 cardiac rehab. Pt verbalized understanding. Pt agrees to phase 2 cardiac rehab referral, will send to [facility]. Pt to chair after walk, call bell within reach.   [phone] Lenna Sciara, RN, BSN 06/12/2016 9:43 AM

## 2016-06-12 NOTE — Progress Notes (Signed)
Subjective:  The patient feels well. He has no complaints this morning. Specifically denies chest pain or shortness of breath.  Objective:  Vital Signs in the last 24 hours: Temp:  [97.7 F (36.5 C)-98.7 F (37.1 C)] 97.8 F (36.6 C) (01/11 0700) Pulse Rate:  [59-61] 61 (01/11 0700) Resp:  [9-22] 16 (01/11 0700) BP: (103-145)/(44-65) 138/56 (01/11 0700) SpO2:  [92 %-100 %] 97 % (01/11 0700) Weight:  [135 lb 8 oz (61.5 kg)] 135 lb 8 oz (61.5 kg) (01/11 0700)  Intake/Output from previous day: 01/10 0701 - 01/11 0700 In: 290 [P.O.:240; IV Piggyback:50] Out: -   Physical Exam: Pt is alert and oriented, NAD HEENT: normal Neck: JVP - normal Lungs: CTA bilaterally CV: RRR with soft systolic ejection murmur at the right upper sternal border Abd: soft, NT, Positive BS, no hepatomegaly Ext: no C/C/E, there is an area of mild ecchymosis around the left groin and left scrotal area with no firm hematoma or tenderness. Skin: warm/dry no rash   Lab Results:  Recent Labs  06/10/16 1030 06/10/16 1039 06/11/16 0355  WBC 4.7  --  5.5  HGB 10.8* 10.9*  10.9* 11.1*  PLT 190  --  189    Recent Labs  06/10/16 0932 06/10/16 1039 06/11/16 0355  NA 139 141  141 138  K 4.4 4.0  4.0 4.0  CL 106  --  107  CO2  --   --  24  GLUCOSE 116* 99  99 112*  BUN 21*  --  22*  CREATININE 1.10  --  1.29*   No results for input(s): TROPONINI in the last 72 hours.  Invalid input(s): CK, MB  Cardiac Studies: 2D Echo: Left ventricle:  The cavity size was normal. Systolic function was normal. The estimated ejection fraction was in the range of 50% to 55%. Wall motion was normal; there were no regional wall motion abnormalities. Doppler parameters are consistent with abnormal left ventricular relaxation (grade 1 diastolic dysfunction).  ------------------------------------------------------------------- Aortic valve:  TAVR bioprosthetic valve is functioning normally. Mobility was  not restricted.  Doppler:  Transvalvular velocity was within the normal range. There was no stenosis. There was trivial regurgitation.    VTI ratio of LVOT to aortic valve: 0.44. Peak velocity ratio of LVOT to aortic valve: 0.44. Mean velocity ratio of LVOT to aortic valve: 0.43.    Mean gradient (S): 6 mm Hg. Peak gradient (S): 13 mm Hg.  ------------------------------------------------------------------- Aorta:  Aortic root: The aortic root was normal in size.  ------------------------------------------------------------------- Mitral valve:   Mildly thickened leaflets . Mobility was not restricted.  Doppler:  Transvalvular velocity was within the normal range. There was no evidence for stenosis. There was trivial regurgitation.  ------------------------------------------------------------------- Left atrium:  The atrium was normal in size.  ------------------------------------------------------------------- Right ventricle:  The cavity size was normal. Wall thickness was normal. Systolic function was normal.  ------------------------------------------------------------------- Pulmonic valve:   Poorly visualized.  Structurally normal valve. Cusp separation was normal.  Doppler:  Transvalvular velocity was within the normal range. There was no evidence for stenosis. There was no regurgitation.  ------------------------------------------------------------------- Tricuspid valve:   Structurally normal valve.    Doppler: Transvalvular velocity was within the normal range. There was moderate regurgitation.  ------------------------------------------------------------------- Pulmonary artery:   The main pulmonary artery was normal-sized. Systolic pressure was mildly increased.  ------------------------------------------------------------------- Right atrium:  The atrium was normal in size.  ------------------------------------------------------------------- Pericardium:   There was no pericardial effusion.  ------------------------------------------------------------------- Systemic veins: Inferior vena  cava: The vessel was normal in size.  Tele: A-paced 60 bpm  Assessment/Plan:  1. Severe aortic stenosis postoperative day #2 from TAVR: The patient has had an uncomplicated postoperative course. His echo is reviewed and shows normal valve function with low gradients and trivial paravalvular insufficiency. He is ready for hospital discharge today. We discussed post discharge restrictions. He can drive in one week and he will return for follow-up at 30 days with an echocardiogram.  2. Coronary artery disease, native vessel: No symptoms of angina. The patient will continue on clopidogrel.  3. Paroxysmal atrial fibrillation: He remains on amiodarone. Eliquis will be restarted this morning.  4. Hypertension: Resume lisinopril 10 mg daily  Disposition: home today   Sherren Mocha, M.D. 06/12/2016, 7:12 AM

## 2016-06-12 NOTE — Discharge Summary (Signed)
Discharge Summary    Patient ID: Ryan [name],  MRN: [id number], DOB/AGE: June 23, 1929 83 y.o.  Admit date: 06/10/2016 Discharge date: 06/12/2016  Primary Care Provider: Velna Hatchet Primary Cardiologist: Dr. Smith/Dr. Lovena Le. Dr. Burt Knack - TAVR  Discharge Diagnoses    Active Problems:   Severe aortic stenosis   Allergies Allergies  Allergen Reactions  . Ciprofloxacin Anaphylaxis  . Hydrochlorothiazide Anaphylaxis  . Sulfa Antibiotics Anaphylaxis  . Ace Inhibitors Cough  . Losartan Cough    Diagnostic Studies/Procedures    Transthoracic Echocardiography 06/11/16 - 1 day post TAVR Study Conclusions  - Left ventricle: The cavity size was normal. Systolic function was   normal. The estimated ejection fraction was in the range of 50%   to 55%. Wall motion was normal; there were no regional wall   motion abnormalities. Doppler parameters are consistent with   abnormal left ventricular relaxation (grade 1 diastolic   dysfunction). - Aortic valve: TAVR bioprosthetic valve is functioning normally.   There was trivial regurgitation. - Mitral valve: There was trivial regurgitation. - Tricuspid valve: There was moderate regurgitation. - Pulmonary arteries: Systolic pressure was mildly increased. PA   peak pressure: 44 mm Hg (S). _____________   History of Present Illness     The patient is an 83 year old gentleman with history of aortic stenosis, coronary artery disease, hypertension, cerebrovascular disease, peripheral vascular disease, and hyperlipidemia who was evaluated in August 2017 for progressive symptoms of exertional shortness of breath and burning pain in his upper chest and throat.   His echo in May 2017 showed moderate AS with a mean gradient of 25 mm Hg and a peak velocity ratio of 0.22 with an LVEF of 60-65%. He underwent cath on 01/17/2016 showing a mean aortic transvalvular gradient of 25 mm Hg and a calculated AVA of 1.04 cm2. Coronary angiography  revealed multivessel coronary artery disease with aneccentric 80% stenosis of the mid right coronary artery, 80% ostial stenosis of a large first obtuse marginal branch and diffuse nonobstructive disease in the LAD. Medical therapy was recommended but he continued to have progressive exertional shortness of breath and pain in the upper chest and neck. He was seen by Dr. Roxy Manns for surgical evaluation and he was concerned about the RCA stenosis and felt that pre-op PCI was indicated.   The patient was subsequently admitted with new onset atrial fibrillation and tachybrady syndrome and underwent implantation of a dual chamber pacemaker by Dr. Lovena Le on 03/21/2016. He then underwent PCI with rotational atherectomy and DES of the RCA on 04/30/2016 and was started on ASA, Plavix and Eliquis with plans for staged TAVR in 4 weeks. He says that he has had no further chest discomfort but still has shortness of breath and fatigue with minimal exertion around the house. He has not been able to walk too much. He has had some dizzy spells and feels like he is in a mental fog in the afternoon but has had no syncope.  He is married and lives with his wife. They have been physically active walking daily and exercising in a swimming pool but he has not been able to do much lately due to symptoms. His ambulation is slowed by severe degenerative arthritis of both knees and he uses a walker if he needs to go very far.    Hospital Course     1. Severe aortic stenosis postoperative day #2 from TAVR: The patient has had an uncomplicated postoperative course. His echo is reviewed and shows  normal valve function with low gradients and trivial paravalvular insufficiency. He is ready for hospital discharge today. We discussed post discharge restrictions. He can drive in one week and he will return for follow-up at 30 days with an echocardiogram.  2. Coronary artery disease, native vessel: No symptoms of angina. The patient will  continue on clopidogrel.  3. Paroxysmal atrial fibrillation: He remains on amiodarone. Eliquis will be restarted on the morning of 06/12/16   4. Hypertension: Resume lisinopril 10 mg daily  _____________  Discharge Vitals Blood pressure (!) 141/57, pulse 61, temperature 97.8 F (36.6 C), temperature source Oral, resp. rate 16, height 5\' 6"  (1.676 m), weight 135 lb 8 oz (61.5 kg), SpO2 97 %.  Filed Weights   06/10/16 0559 06/11/16 0300 06/12/16 0700  Weight: 136 lb (61.7 kg) 135 lb 12.9 oz (61.6 kg) 135 lb 8 oz (61.5 kg)    Labs & Radiologic Studies     CBC  Recent Labs  06/10/16 1030 06/10/16 1039 06/11/16 0355  WBC 4.7  --  5.5  HGB 10.8* 10.9*  10.9* 11.1*  HCT 33.1* 32.0*  32.0* 33.6*  MCV 89.0  --  90.6  PLT 190  --  053   Basic Metabolic Panel  Recent Labs  06/10/16 0932 06/10/16 1039 06/11/16 0355  NA 139 141  141 138  K 4.4 4.0  4.0 4.0  CL 106  --  107  CO2  --   --  24  GLUCOSE 116* 99  99 112*  BUN 21*  --  22*  CREATININE 1.10  --  1.29*  CALCIUM  --   --  8.6*  MG  --   --  2.3   Dg Chest 2 View  Result Date: 06/06/2016 CLINICAL DATA:  Severe aortic stenosis. Pre operative respiratory exam. EXAM: CHEST  2 VIEW COMPARISON:  03/22/2016 FINDINGS: Heart size pulmonary vascularity are normal. Pacemaker in place. Lungs are clear. No effusions or wall abnormality.Calcification in the thoracic aorta. IMPRESSION: No acute abnormality. Aortic atherosclerosis. Electronically Signed   By: Lorriane Shire M.D.   On: 06/06/2016 09:52   Dg Chest Port 1 View  Result Date: 06/11/2016 CLINICAL DATA:  Status post transcatheter aortic valve replacement for severe aortic stenosis. Chest soreness. EXAM: PORTABLE CHEST 1 VIEW COMPARISON:  Chest x-ray of June 10, 2016. FINDINGS: The lungs are well-expanded. Atelectasis on the left has resolved. The right lung is clear. The cardiac silhouette is mildly enlarged but stable. The pulmonary vascularity is normal. The  aortic valve cage appears to be in stable position. The permanent pacemakers also in stable position. There is calcification in the wall of the aortic arch. The right internal jugular venous catheter tip projects over the midportion of the SVC. IMPRESSION: Interval resolution of basilar atelectasis. No pulmonary edema. No acute cardiopulmonary abnormality. Thoracic aortic atherosclerosis. Electronically Signed   By: David  Martinique M.D.   On: 06/11/2016 08:07   Dg Chest Port 1 View  Result Date: 06/10/2016 CLINICAL DATA:  Post TAVR for severe aortic stenosis, history hypertension, P SVT EXAM: PORTABLE CHEST 1 VIEW COMPARISON:  Portable exam 1048 hours compared to 06/06/2016 FINDINGS: RIGHT jugular central venous catheter with tip projecting over SVC near cavoatrial junction. LEFT subclavian transvenous pacemaker leads project over RIGHT atrium and RIGHT ventricle unchanged. Interval TAVR. Enlargement of cardiac silhouette. Mediastinal contours and pulmonary vascularity normal. Streaky atelectasis at lung bases. Lungs otherwise clear. No pleural effusion or pneumothorax. Bones demineralized. IMPRESSION: Bibasilar atelectasis. Interval TAVR. Electronically  Signed   By: Lavonia Dana M.D.   On: 06/10/2016 11:00    Disposition   Pt is being discharged home today in good condition.  Follow-up Plans & Appointments    Follow-up Information    Sherren Mocha, MD Follow up on 07/04/2016.   Specialty:  Cardiology Why:  at 10:30am for hospital follow up  Contact information: [address] [phone]          Discharge Instructions    Amb Referral to Cardiac Rehabilitation    Complete by:  As directed    Diagnosis:  Valve Replacement   Valve:  Aortic Comment - s/p TAVR   Diet - low sodium heart healthy    Complete by:  As directed    Increase activity slowly    Complete by:  As directed       Discharge Medications   Discharge Medication List as of  06/12/2016 12:23 PM    CONTINUE these medications which have NOT CHANGED   Details  acetaminophen (TYLENOL) 500 MG tablet Take 500 mg by mouth every 6 (six) hours as needed for mild pain., Historical Med    amiodarone (PACERONE) 200 MG tablet Take 1 tablet (200 mg total) by mouth daily., Starting Mon 03/31/2016, Normal    apixaban (ELIQUIS) 2.5 MG TABS tablet Take 1 tablet (2.5 mg total) by mouth 2 (two) times daily., Starting Sat 03/22/2016, Normal    atorvastatin (LIPITOR) 40 MG tablet Take 1 tablet (40 mg total) by mouth daily., Starting Sat 03/22/2016, Normal    cholecalciferol (VITAMIN D) 1000 units tablet Take 1,000 Units by mouth 2 (two) times a week. , Historical Med    clopidogrel (PLAVIX) 75 MG tablet Take 1 tablet (75 mg total) by mouth daily., Starting Fri 04/18/2016, Normal    Coenzyme Q10 (COQ-10 PO) Take 1 capsule by mouth 2 (two) times a week. , Historical Med    latanoprost (XALATAN) 0.005 % ophthalmic solution Place 1 drop into both eyes at bedtime., Historical Med    lisinopril (PRINIVIL,ZESTRIL) 10 MG tablet Take 1 tablet (10 mg total) by mouth daily., Starting Fri 05/09/2016, Until Thu 08/07/2016, Normal    nitroGLYCERIN (NITROSTAT) 0.4 MG SL tablet Place 1 tablet (0.4 mg total) under the tongue every 5 (five) minutes as needed., Starting Thu 05/01/2016, Normal      STOP taking these medications     aspirin 81 MG tablet            Outstanding Labs/Studies     Duration of Discharge Encounter   Greater than 30 minutes including physician time.  Signed, Arbutus Leas NP 06/12/2016, 2:43 PM

## 2016-06-12 NOTE — Progress Notes (Signed)
06/12/2015 1745 Received pt to room 2W36 from 2S.  Pt is A&O, no c/o voiced.  Tele monitor applied and CCMD notified.  Oriented to room, call light and bed.  Call bell in reach, wife at bedside. [place]

## 2016-06-12 NOTE — Progress Notes (Signed)
06/12/2016 12:55 PM Discharge AVS meds taken today and those due this evening reviewed.  Follow-up appointments and when to call md reviewed.  D/C IV and TELE.  Questions and concerns addressed.   D/C home per orders. [place]

## 2016-06-12 NOTE — Progress Notes (Signed)
2 Days Post-Op Procedure(s) (LRB): TRANSCATHETER AORTIC VALVE REPLACEMENT, TRANSFEMORAL (N/A) TRANSESOPHAGEAL ECHOCARDIOGRAM (TEE) (N/A) Subjective:  No complaints. Ambulating well  Objective: Vital signs in last 24 hours: Temp:  [97.7 F (36.5 C)-98.7 F (37.1 C)] 97.8 F (36.6 C) (01/11 0700) Pulse Rate:  [59-61] 61 (01/11 0700) Cardiac Rhythm: Normal sinus rhythm;Atrial paced (01/10 1900) Resp:  [9-18] 16 (01/11 0700) BP: (103-145)/(44-65) 138/56 (01/11 0700) SpO2:  [92 %-100 %] 97 % (01/11 0700) Weight:  [61.5 kg (135 lb 8 oz)] 61.5 kg (135 lb 8 oz) (01/11 0700)  Hemodynamic parameters for last 24 hours:    Intake/Output from previous day: 01/10 0701 - 01/11 0700 In: 290 [P.O.:240; IV Piggyback:50] Out: -  Intake/Output this shift: No intake/output data recorded.  General appearance: alert and cooperative Neurologic: intact Heart: regular rate and rhythm, S1, S2 normal, no murmur, click, rub or gallop Lungs: clear to auscultation bilaterally Extremities: extremities normal, atraumatic, no cyanosis or edema Wound: groin sites ok  Lab Results:  Recent Labs  06/10/16 1030 06/10/16 1039 06/11/16 0355  WBC 4.7  --  5.5  HGB 10.8* 10.9*  10.9* 11.1*  HCT 33.1* 32.0*  32.0* 33.6*  PLT 190  --  189   BMET:  Recent Labs  06/10/16 0932 06/10/16 1039 06/11/16 0355  NA 139 141  141 138  K 4.4 4.0  4.0 4.0  CL 106  --  107  CO2  --   --  24  GLUCOSE 116* 99  99 112*  BUN 21*  --  22*  CREATININE 1.10  --  1.29*  CALCIUM  --   --  8.6*    PT/INR:  Recent Labs  06/10/16 1030  LABPROT 15.9*  INR 1.26   ABG    Component Value Date/Time   PHART 7.460 (H) 06/10/2016 1036   PHART 7.460 (H) 06/10/2016 1036   HCO3 22.2 06/10/2016 1036   HCO3 22.2 06/10/2016 1036   TCO2 23 06/10/2016 1036   TCO2 23 06/10/2016 1036   ACIDBASEDEF 1.0 06/10/2016 1036   ACIDBASEDEF 1.0 06/10/2016 1036   O2SAT 96.0 06/10/2016 1036   O2SAT 96.0 06/10/2016 1036   CBG  (last 3)  No results for input(s): GLUCAP in the last 72 hours.  Assessment/Plan: S/P Procedure(s) (LRB): TRANSCATHETER AORTIC VALVE REPLACEMENT, TRANSFEMORAL (N/A) TRANSESOPHAGEAL ECHOCARDIOGRAM (TEE) (N/A)  He is doing well postop. Echo reviewed and aortic valve prosthesis looks good with expected low mean gradient of 6 mm Hg, trivial AI. He can go home today on Plavix and Eliquis. He will follow up in one month with an echo.   LOS: 2 days    Gaye Pollack 06/12/2016

## 2016-06-13 ENCOUNTER — Telehealth: Payer: Self-pay | Admitting: Cardiovascular Disease

## 2016-06-13 NOTE — Telephone Encounter (Signed)
New message    Pt verbalized that he wants to speak to rn for his eye issues

## 2016-06-13 NOTE — Telephone Encounter (Signed)
Patient wants to get an eye exam.  He had valve replacement surgery on 06-10-16.  Eye Dr is wanting a clearance prior to this.  I advised the patient to have the eye Drs office to fax a clearance request to our office for Dr Burt Knack.

## 2016-06-17 NOTE — Telephone Encounter (Signed)
I spoke with the pt and made him aware that he can proceed with eye exam from a cardiac standpoint.  The pt said he may need some form of eye surgery in the future but he is not having surgery at this time and knows that if he requires surgery then clearance may be needed at that time.

## 2016-06-17 NOTE — Telephone Encounter (Signed)
Not sure why an 'eye exam' would require clearance but he should be fine to do that. He would be at low cardiac risk.

## 2016-06-20 ENCOUNTER — Telehealth (HOSPITAL_COMMUNITY): Payer: Self-pay | Admitting: Internal Medicine

## 2016-06-20 NOTE — Telephone Encounter (Deleted)
S/w Laquina H. From [facility] verifying benefits for pt for Cardiac Rehab. Primary Insurance is BCBS with $1,080.00 Deductible, 30% co-insurance and Out of Pocket $4,388.00, with nothing met. No limit of visits.... Reference # [id number]... KJ

## 2016-06-25 DIAGNOSIS — H353131 Nonexudative age-related macular degeneration, bilateral, early dry stage: Secondary | ICD-10-CM | POA: Diagnosis not present

## 2016-06-25 DIAGNOSIS — H401213 Low-tension glaucoma, right eye, severe stage: Secondary | ICD-10-CM | POA: Diagnosis not present

## 2016-06-25 DIAGNOSIS — H401222 Low-tension glaucoma, left eye, moderate stage: Secondary | ICD-10-CM | POA: Diagnosis not present

## 2016-06-25 DIAGNOSIS — H5213 Myopia, bilateral: Secondary | ICD-10-CM | POA: Diagnosis not present

## 2016-06-25 DIAGNOSIS — H2513 Age-related nuclear cataract, bilateral: Secondary | ICD-10-CM | POA: Diagnosis not present

## 2016-07-01 ENCOUNTER — Ambulatory Visit (INDEPENDENT_AMBULATORY_CARE_PROVIDER_SITE_OTHER): Payer: Medicare Other | Admitting: Internal Medicine

## 2016-07-01 ENCOUNTER — Encounter: Payer: Self-pay | Admitting: Internal Medicine

## 2016-07-01 VITALS — BP 142/74 | HR 62 | Ht 67.0 in | Wt 132.4 lb

## 2016-07-01 DIAGNOSIS — Z950 Presence of cardiac pacemaker: Secondary | ICD-10-CM | POA: Diagnosis not present

## 2016-07-01 DIAGNOSIS — I495 Sick sinus syndrome: Secondary | ICD-10-CM | POA: Diagnosis not present

## 2016-07-01 NOTE — Progress Notes (Signed)
HPI [name] returns today for followup of symptomatic tachy-brady, s/p PPM insertion.  He returns for followup. He denies chest pain. No syncope. No palpitations. No edema.  Allergies  Allergen Reactions  . Ciprofloxacin Anaphylaxis  . Hydrochlorothiazide Anaphylaxis  . Sulfa Antibiotics Anaphylaxis  . Ace Inhibitors Cough  . Losartan Cough     Current Outpatient Prescriptions  Medication Sig Dispense Refill  . acetaminophen (TYLENOL) 500 MG tablet Take 500 mg by mouth every 6 (six) hours as needed for mild pain.    Marland Kitchen amiodarone (PACERONE) 200 MG tablet Take 1 tablet (200 mg total) by mouth daily. 90 tablet 3  . apixaban (ELIQUIS) 2.5 MG TABS tablet Take 1 tablet (2.5 mg total) by mouth 2 (two) times daily. 60 tablet 10  . atorvastatin (LIPITOR) 40 MG tablet Take 1 tablet (40 mg total) by mouth daily. (Patient taking differently: Take 40 mg by mouth daily at 6 PM. ) 30 tablet 5  . cholecalciferol (VITAMIN D) 1000 units tablet Take 1,000 Units by mouth 2 (two) times a week.     . clopidogrel (PLAVIX) 75 MG tablet Take 1 tablet (75 mg total) by mouth daily. (Patient taking differently: Take 75 mg by mouth every evening. ) 90 tablet 3  . Coenzyme Q10 (COQ-10 PO) Take 1 capsule by mouth 2 (two) times a week.     . latanoprost (XALATAN) 0.005 % ophthalmic solution Place 1 drop into both eyes at bedtime.    Marland Kitchen lisinopril (PRINIVIL,ZESTRIL) 10 MG tablet Take 1 tablet (10 mg total) by mouth daily. 90 tablet 3  . nitroGLYCERIN (NITROSTAT) 0.4 MG SL tablet Place 1 tablet (0.4 mg total) under the tongue every 5 (five) minutes as needed. 25 tablet 3  . timolol (TIMOPTIC) 0.5 % ophthalmic solution Place 1 drop into both eyes daily.  1   No current facility-administered medications for this visit.      Past Medical History:  Diagnosis Date  . Age-related macular degeneration   . Allergic rhinitis   . Aortic valve stenosis   . Arthritis    "hands, lower back" (04/30/2016)  . Asthma    . Carotid artery obstruction   . Chronic lower back pain   . Coronary arteriosclerosis in native artery   . Diverticulitis of colon   . DJD (degenerative joint disease)   . Dyspnea    "constant but the degree changes"  . Dysthymia   . GERD (gastroesophageal reflux disease)   . History of elevated PSA 05/2009  . History of hiatal hernia   . HLD (hyperlipidemia)   . Hypertension   . Nocturnal hypoxemia   . OSA (obstructive sleep apnea)    intolerant to CPAP  . Paroxysmal supraventricular tachycardia ([place])   . Pneumonia    "when I was a kid"  . Presence of permanent cardiac pacemaker   . Primary malignant neoplasm of bladder ([place])   . Prostate cancer ([place])     ROS:   All systems reviewed and negative except as noted in the HPI.   Past Surgical History:  Procedure Laterality Date  . APPENDECTOMY    . CARDIAC CATHETERIZATION N/A 01/17/2016   Procedure: Right/Left Heart Cath and Coronary Angiography;  Surgeon: Belva Crome, MD;  Location: [place] [facility];  Service: Cardiovascular;  Laterality: N/A;  . CARDIAC CATHETERIZATION N/A 04/30/2016   Procedure: Coronary/Graft Atherectomy;  Surgeon: Sherren Mocha, MD;  Location: [place] [facility];  Service: Cardiovascular;  Laterality: N/A;  .  CAROTID ENDARTERECTOMY Bilateral   . CATARACT EXTRACTION W/ INTRAOCULAR LENS  IMPLANT, BILATERAL Bilateral   . COLON SURGERY  1981   Meckles Diverticulum with volvulus  . EP IMPLANTABLE DEVICE N/A 03/21/2016   Procedure: Pacemaker Implant;  Surgeon: Evans Lance, MD;  Location: [place] [facility];  Service: Cardiovascular;  Laterality: N/A;  . INGUINAL HERNIA REPAIR Right 2009  . INSERTION PROSTATE RADIATION SEED    . TEE WITHOUT CARDIOVERSION N/A 06/10/2016   Procedure: TRANSESOPHAGEAL ECHOCARDIOGRAM (TEE);  Surgeon: Sherren Mocha, MD;  Location: [facility];  Service: Open Heart Surgery;  Laterality: N/A;  . TONSILLECTOMY  ~ 1947  . TRANSCATHETER AORTIC VALVE REPLACEMENT, TRANSFEMORAL N/A  06/10/2016   Procedure: TRANSCATHETER AORTIC VALVE REPLACEMENT, TRANSFEMORAL;  Surgeon: Sherren Mocha, MD;  Location: [place];  Service: Open Heart Surgery;  Laterality: N/A;     Family History  Problem Relation Age of Onset  . Heart disease Mother   . Heart disease Father      Social History   Social History  . Marital status: Married    Spouse name: N/A  . Number of children: N/A  . Years of education: N/A   Occupational History  . retired    Social History Main Topics  . Smoking status: Never Smoker  . Smokeless tobacco: Never Used  . Alcohol use No     Comment: 04/30/2016 "nothing in years"  . Drug use: No  . Sexual activity: No   Other Topics Concern  . Not on file   Social History Narrative  . No narrative on file     BP (!) 142/74 (BP Location: Left Arm)   Pulse 62   Ht 5\' 7"  (1.702 m)   Wt 132 lb 6.4 oz (60.1 kg)   BMI 20.74 kg/m   Physical Exam:  Well appearing elderly, NAD HEENT: Unremarkable Neck:  6 cm JVD, no thyromegally Lymphatics:  No adenopathy Back:  No CVA tenderness Lungs:  Clear with no wheezes. HEART:  Regular rate rhythm, no murmurs, no rubs, no clicks Abd:  soft, positive bowel sounds, no organomegally, no rebound, no guarding Ext:  2 plus pulses, no edema, no cyanosis, no clubbing Skin:  No rashes no nodules Neuro:  CN II through XII intact, motor grossly intact  DEVICE  Normal device function.  See PaceArt for details.   Assess/Plan: 1. PAF - he is maintaining NSR. Will follow 2. PPM - his Medtronic device is working normally. Will recheck in several months. 3. HTN - his blood pressure is up a little. Will follow. He is encouraged to maintain a low sodium diet.  Ryan [name].D.

## 2016-07-01 NOTE — Patient Instructions (Signed)
Medication Instructions:  Your physician recommends that you continue on your current medications as directed. Please refer to the Current Medication list given to you today.  Labwork: None ordered  Testing/Procedures: None ordered  Follow-Up: Your physician wants you to follow-up in 9 months with Dr. Lovena Le. You will receive a reminder letter in the mail two months in advance. If you don't receive a letter, please call our office to schedule the follow-up appointment.  Remote monitoring is used to monitor your Pacemaker from home. This monitoring reduces the number of office visits required to check your device to one time per year. It allows [place] to keep an eye on the functioning of your device to ensure it is working properly. You are scheduled for a device check from home on 09-30-2016. You may send your transmission at any time that day. If you have a wireless device, the transmission will be sent automatically. After your physician reviews your transmission, you will receive a postcard with your next transmission date.     Any Other Special Instructions Will Be Listed Below (If Applicable).     If you need a refill on your cardiac medications before your next appointment, please call your pharmacy.

## 2016-07-04 ENCOUNTER — Ambulatory Visit (HOSPITAL_COMMUNITY): Payer: Medicare Other | Attending: Cardiovascular Disease

## 2016-07-04 ENCOUNTER — Ambulatory Visit (INDEPENDENT_AMBULATORY_CARE_PROVIDER_SITE_OTHER): Payer: Medicare Other | Admitting: Cardiovascular Disease

## 2016-07-04 ENCOUNTER — Other Ambulatory Visit: Payer: Self-pay

## 2016-07-04 ENCOUNTER — Encounter: Payer: Self-pay | Admitting: Cardiovascular Disease

## 2016-07-04 VITALS — BP 148/80 | HR 80 | Ht 67.0 in | Wt 130.8 lb

## 2016-07-04 DIAGNOSIS — M1990 Unspecified osteoarthritis, unspecified site: Secondary | ICD-10-CM | POA: Insufficient documentation

## 2016-07-04 DIAGNOSIS — I350 Nonrheumatic aortic (valve) stenosis: Secondary | ICD-10-CM

## 2016-07-04 DIAGNOSIS — E785 Hyperlipidemia, unspecified: Secondary | ICD-10-CM | POA: Diagnosis not present

## 2016-07-04 DIAGNOSIS — G4733 Obstructive sleep apnea (adult) (pediatric): Secondary | ICD-10-CM | POA: Diagnosis not present

## 2016-07-04 DIAGNOSIS — I10 Essential (primary) hypertension: Secondary | ICD-10-CM | POA: Insufficient documentation

## 2016-07-04 DIAGNOSIS — I471 Supraventricular tachycardia: Secondary | ICD-10-CM | POA: Insufficient documentation

## 2016-07-04 DIAGNOSIS — Z952 Presence of prosthetic heart valve: Secondary | ICD-10-CM

## 2016-07-04 DIAGNOSIS — J189 Pneumonia, unspecified organism: Secondary | ICD-10-CM | POA: Insufficient documentation

## 2016-07-04 DIAGNOSIS — I083 Combined rheumatic disorders of mitral, aortic and tricuspid valves: Secondary | ICD-10-CM | POA: Diagnosis not present

## 2016-07-04 MED ORDER — LISINOPRIL 20 MG PO TABS: 20.0000 mg | ORAL_TABLET | Freq: Every day | ORAL | 3 refills | Status: DC

## 2016-07-04 NOTE — Patient Instructions (Addendum)
Medication Instructions:  Your physician has recommended you make the following change in your medication:  1. INCREASE Lisinopril to 20mg  take one tablet by mouth daily  You can STOP Plavix (clopidogrel) 10/31/2016  Labwork: No new orders.   Testing/Procedures: Your physician has requested that you have an echocardiogram in 1 YEAR. Echocardiography is a painless test that uses sound waves to create images of your heart. It provides your doctor with information about the size and shape of your heart and how well your heart's chambers and valves are working. This procedure takes approximately one hour. There are no restrictions for this procedure.  Follow-Up: Keep your scheduled follow-up appointment with Dr Meda Coffee.   Your physician wants you to follow-up in: 1 YEAR with Dr Burt Knack.  You will receive a reminder letter in the mail two months in advance. If you don't receive a letter, please call our office to schedule the follow-up appointment.  You have been referred to Cardiac Rehabilitation.   Any Other Special Instructions Will Be Listed Below (If Applicable).     If you need a refill on your cardiac medications before your next appointment, please call your pharmacy.

## 2016-07-04 NOTE — Progress Notes (Signed)
Cardiology Office Note Date:  07/04/2016   ID:  Ryan [name], DOB 09-19-1929, MRN [id number]  PCP:  Velna Hatchet, MD  Cardiologist:  Sherren Mocha, MD    Chief Complaint  Patient presents with  . Fatigue    History of Present Illness: Ryan [name] is a 83 y.o. male who presents for one month TAVR follow-up. He has a fairly complex cardiac history and within the last few months has undergone permanent pacemaker placement for paroxysmal atrial fibrillation with tachybradycardia syndrome, has undergone PCI for severe RCA stenosis, and then underwent TAVR 06/10/2016 for treatment of severe systematic aortic stenosis.  The patient is here alone today. He is back to all of his normal activities.He continues to have fatigue. Otherwise is doing pretty well. Denies chest pain, edema, palpitations. Has some DOE with physical activity. He has been walking 15-20 minutes twice/day. Eager to start cardiac rehab.    Past Medical History:  Diagnosis Date  . Age-related macular degeneration   . Allergic rhinitis   . Aortic valve stenosis   . Arthritis    "hands, lower back" (04/30/2016)  . Asthma   . Carotid artery obstruction   . Chronic lower back pain   . Coronary arteriosclerosis in native artery   . Diverticulitis of colon   . DJD (degenerative joint disease)   . Dyspnea    "constant but the degree changes"  . Dysthymia   . GERD (gastroesophageal reflux disease)   . History of elevated PSA 05/2009  . History of hiatal hernia   . HLD (hyperlipidemia)   . Hypertension   . Nocturnal hypoxemia   . OSA (obstructive sleep apnea)    intolerant to CPAP  . Paroxysmal supraventricular tachycardia ([place])   . Pneumonia    "when I was a kid"  . Presence of permanent cardiac pacemaker   . Primary malignant neoplasm of bladder ([place])   . Prostate cancer [facility])     Past Surgical History:  Procedure Laterality Date  . APPENDECTOMY    . CARDIAC CATHETERIZATION N/A 01/17/2016   Procedure: Right/Left Heart Cath and Coronary Angiography;  Surgeon: Belva Crome, MD;  Location: [place] [facility];  Service: Cardiovascular;  Laterality: N/A;  . CARDIAC CATHETERIZATION N/A 04/30/2016   Procedure: Coronary/Graft Atherectomy;  Surgeon: Sherren Mocha, MD;  Location: [place] [facility];  Service: Cardiovascular;  Laterality: N/A;  . CAROTID ENDARTERECTOMY Bilateral   . CATARACT EXTRACTION W/ INTRAOCULAR LENS  IMPLANT, BILATERAL Bilateral   . COLON SURGERY  1981   Meckles Diverticulum with volvulus  . EP IMPLANTABLE DEVICE N/A 03/21/2016   Procedure: Pacemaker Implant;  Surgeon: Evans Lance, MD;  Location: [place] [facility];  Service: Cardiovascular;  Laterality: N/A;  . INGUINAL HERNIA REPAIR Right 2009  . INSERTION PROSTATE RADIATION SEED    . TEE WITHOUT CARDIOVERSION N/A 06/10/2016   Procedure: TRANSESOPHAGEAL ECHOCARDIOGRAM (TEE);  Surgeon: Sherren Mocha, MD;  Location: [place];  Service: Open Heart Surgery;  Laterality: N/A;  . TONSILLECTOMY  ~ 1947  . TRANSCATHETER AORTIC VALVE REPLACEMENT, TRANSFEMORAL N/A 06/10/2016   Procedure: TRANSCATHETER AORTIC VALVE REPLACEMENT, TRANSFEMORAL;  Surgeon: Sherren Mocha, MD;  Location: [place];  Service: Open Heart Surgery;  Laterality: N/A;    Current Outpatient Prescriptions  Medication Sig Dispense Refill  . acetaminophen (TYLENOL) 500 MG tablet Take 500 mg by mouth every 6 (six) hours as needed for mild pain.    Marland Kitchen amiodarone (PACERONE) 200 MG tablet Take 1 tablet (200 mg  total) by mouth daily. 90 tablet 3  . apixaban (ELIQUIS) 2.5 MG TABS tablet Take 1 tablet (2.5 mg total) by mouth 2 (two) times daily. 60 tablet 10  . atorvastatin (LIPITOR) 40 MG tablet Take 1 tablet (40 mg total) by mouth daily. (Patient taking differently: Take 40 mg by mouth daily at 6 PM. ) 30 tablet 5  . cholecalciferol (VITAMIN D) 1000 units tablet Take 1,000 Units by mouth 2 (two) times a week.     . clopidogrel (PLAVIX) 75 MG tablet Take 1 tablet (75  mg total) by mouth daily. (Patient taking differently: Take 75 mg by mouth every evening. ) 90 tablet 3  . Coenzyme Q10 (COQ-10 PO) Take 1 capsule by mouth 2 (two) times a week.     . latanoprost (XALATAN) 0.005 % ophthalmic solution Place 1 drop into both eyes at bedtime.    Marland Kitchen lisinopril (PRINIVIL,ZESTRIL) 20 MG tablet Take 1 tablet (20 mg total) by mouth daily. 90 tablet 3  . nitroGLYCERIN (NITROSTAT) 0.4 MG SL tablet Place 1 tablet (0.4 mg total) under the tongue every 5 (five) minutes as needed. 25 tablet 3  . timolol (TIMOPTIC) 0.5 % ophthalmic solution Place 1 drop into both eyes daily.  1   No current facility-administered medications for this visit.     Allergies:   Ciprofloxacin; Hydrochlorothiazide; Sulfa antibiotics; Ace inhibitors; and Losartan   Social History:  The patient  reports that he has never smoked. He has never used smokeless tobacco. He reports that he does not drink alcohol or use drugs.   Family History:  The patient's  family history includes Heart disease in his father and mother.    ROS:  Please see the history of present illness.  All other systems are reviewed and negative.    PHYSICAL EXAM: VS:  BP (!) 148/80   Pulse 80   Ht 5\' 7"  (1.702 m)   Wt 130 lb 12.8 oz (59.3 kg)   BMI 20.49 kg/m  , BMI Body mass index is 20.49 kg/m. GEN: Well nourished, well developed, in no acute distress  HEENT: normal  Neck: no JVD, no masses. No carotid bruits Cardiac: RRR without murmur or gallop                Respiratory:  clear to auscultation bilaterally, normal work of breathing GI: soft, nontender, nondistended, + BS MS: no deformity or atrophy  Ext: no pretibial edema, pedal pulses 2+= bilaterally Skin: warm and dry, no rash Neuro:  Strength and sensation are intact Psych: euthymic mood, full affect  EKG:  EKG is ordered today. The ekg ordered today shows electronic atrial pacemaker, LBBB, HR 80 bpm, occasional PVC  Recent Labs: 03/18/2016: B Natriuretic  Peptide 38.4; TSH 2.194 06/06/2016: ALT 32 06/11/2016: BUN 22; Creatinine, Ser 1.29; Hemoglobin 11.1; Magnesium 2.3; Platelets 189; Potassium 4.0; Sodium 138   Lipid Panel     Component Value Date/Time   CHOL 147 03/20/2016 0422   TRIG 54 03/20/2016 0422   HDL 61 03/20/2016 0422   CHOLHDL 2.4 03/20/2016 0422   VLDL 11 03/20/2016 0422   LDLCALC 75 03/20/2016 0422      Wt Readings from Last 3 Encounters:  07/04/16 130 lb 12.8 oz (59.3 kg)  07/01/16 132 lb 6.4 oz (60.1 kg)  06/12/16 135 lb 8 oz (61.5 kg)     Cardiac Studies Reviewed: 2D Echo images reviewed (formal interpretation pending): LV function appears low-normal and there is a conduction-related delay secondary to pacing.  The aortic prosthesis is well-visualized and there is trivial AI seen on doppler envelope only. The peak and mean transvalvular gradients are 11 and 7 mmHg, respectively.   ASSESSMENT AND PLAN: 1.  Severe aortic stenosis s/p TAVR: NYHA II sx's of chronic diastolic CHF. No volume overload on exam. Valve function is normal by echo and exam. He has recovered well. Continues on eliquis and plavix.   2. CAD s/p PCI: no angina. Advised to stop plavix in June when he is 6 months out from PCI.  3. PAF: on Eliquis long-term. Amiodarone for rhythm control.   4. HTN, uncontrolled: home BP's reviewed and systolic BP is above goal. Increase lisinopril to 20 mg daily.   Current medicines are reviewed with the patient today.  The patient does not have concerns regarding medicines.  Labs/ tests ordered today include:   Orders Placed This Encounter  Procedures  . AMB referral to cardiac rehabilitation  . EKG 12-Lead  . ECHOCARDIOGRAM COMPLETE    Disposition:   FU with Dr Meda Coffee as scheduled.   Deatra James, MD  07/04/2016 5:11 PM    [facility] [address], [place], [place]  [address] Phone: [phone]; Fax: [phone]

## 2016-07-07 ENCOUNTER — Telehealth: Payer: Self-pay | Admitting: Cardiovascular Disease

## 2016-07-07 NOTE — Telephone Encounter (Signed)
Called Ryan [name] regarding his concerns that [employer] has denied TAVR.  Assured him that (according to acct notes), the claim is being worked on hospital side and will be on pro-fee side as well.  Advised him that these wheels turn slowly & asked for his patience in getting it review.  He was appreciative of the return call and will CB if he has questions regarding future statements.

## 2016-07-07 NOTE — Telephone Encounter (Signed)
I spoke with the pt and he received 4 statements from [facility] in the mail last Friday that his TAVR (06/10/2016) was denied. Statements were in regards to the care provided by Dr Burt Knack, Dr Cyndia Bent, Dr Meda Coffee and Dr Marlou Porch. I advised the pt that I will reach out to our billing department so that they can address any issue with [employer].  The pt states that prior to his surgery he did receive a letter from [facility] stating that his surgery was approved. I will forward this message to billing to follow-up with the pt.

## 2016-07-07 NOTE — Telephone Encounter (Signed)
Ryan [name] is requesting a call back in regards to a procedure that he had completed. Please call, thanks.

## 2016-07-17 DIAGNOSIS — Z85828 Personal history of other malignant neoplasm of skin: Secondary | ICD-10-CM | POA: Diagnosis not present

## 2016-07-17 DIAGNOSIS — D485 Neoplasm of uncertain behavior of skin: Secondary | ICD-10-CM | POA: Diagnosis not present

## 2016-07-17 DIAGNOSIS — L821 Other seborrheic keratosis: Secondary | ICD-10-CM | POA: Diagnosis not present

## 2016-07-17 DIAGNOSIS — L570 Actinic keratosis: Secondary | ICD-10-CM | POA: Diagnosis not present

## 2016-07-17 DIAGNOSIS — L820 Inflamed seborrheic keratosis: Secondary | ICD-10-CM | POA: Diagnosis not present

## 2016-07-23 ENCOUNTER — Telehealth (HOSPITAL_COMMUNITY): Payer: Self-pay | Admitting: Internal Medicine

## 2016-07-23 ENCOUNTER — Encounter: Payer: Self-pay | Admitting: Cardiology

## 2016-07-23 ENCOUNTER — Ambulatory Visit (INDEPENDENT_AMBULATORY_CARE_PROVIDER_SITE_OTHER): Payer: Medicare Other | Admitting: Cardiology

## 2016-07-23 VITALS — BP 142/70 | HR 86 | Ht 67.0 in | Wt 135.0 lb

## 2016-07-23 DIAGNOSIS — I495 Sick sinus syndrome: Secondary | ICD-10-CM

## 2016-07-23 DIAGNOSIS — E782 Mixed hyperlipidemia: Secondary | ICD-10-CM

## 2016-07-23 DIAGNOSIS — I481 Persistent atrial fibrillation: Secondary | ICD-10-CM

## 2016-07-23 DIAGNOSIS — I2510 Atherosclerotic heart disease of native coronary artery without angina pectoris: Secondary | ICD-10-CM

## 2016-07-23 DIAGNOSIS — I4819 Other persistent atrial fibrillation: Secondary | ICD-10-CM

## 2016-07-23 DIAGNOSIS — Z952 Presence of prosthetic heart valve: Secondary | ICD-10-CM

## 2016-07-23 DIAGNOSIS — I5032 Chronic diastolic (congestive) heart failure: Secondary | ICD-10-CM

## 2016-07-23 DIAGNOSIS — Z950 Presence of cardiac pacemaker: Secondary | ICD-10-CM

## 2016-07-23 DIAGNOSIS — I350 Nonrheumatic aortic (valve) stenosis: Secondary | ICD-10-CM | POA: Diagnosis not present

## 2016-07-23 DIAGNOSIS — I110 Hypertensive heart disease with heart failure: Secondary | ICD-10-CM

## 2016-07-23 NOTE — Progress Notes (Signed)
Cardiology Office Note:    Date:  07/23/2016   ID:  [name], DOB 05/10/1930, MRN [id number]  PCP:  Velna Hatchet, MD  Cardiologist:  Dr. Ena Dawley  TAVR: Dr. Sherren Mocha   Electrophysiologist:  Dr. Cristopher Peru   Referring MD: Velna Hatchet, MD   Chief complain: follow up after TAVR  History of Present Illness:    Ryan [name] is a 83 y.o. male with a hx of carotid artery disease s/p bilateral CEA, CAD, aortic stenosis, HTN, HL, prior SVT, OSA, diastolic CHF, paroxysmal atrial fibrillation.  Echo in 5/17 demonstrated mod aortic stenosis with mean gradient 25 mmHg. In 8/17, he developed worsening dyspnea with exertion and fatigue. Cardiac catheterization demonstrated worsening CAD with 70% mid LCx, 80% OM1 and 80% mid RCA stenosis. PCI would be high risk. Medical therapy was recommended initially. He then saw Dr. Roxy Manns in surgical consultation. He was felt to have stage D severe symptomatic aortic stenosis. Work up for AVR vs TAVR was initiated.  He was then admitted in 10/17 with unstable angina in the setting of AF with RVR. This was complicated by tachybradycardia syndrome and he underwent pacemaker implantation with Dr. Lovena Le.   05/22/2016 - He returns for Cardiology follow up. In 11/17, he underwent successful rotational atherectomy and stenting of the RCA with a 3 x 12 mm Synergy DES on 04/30/16 by Dr Burt Knack. He was discharged on aspirin, Plavix and apixaban with plans to discontinue aspirin after 30 days. Apixaban to be stopped 5 days prior to TAVR. TAVR is scheduled for 06/11/2015. He states that he continues to have dyspnea on minimal exertion, dizziness with exertion. No syncope. He continues to have daily epistaxis.  07/23/2016 - 6 weeks post TAVR, he is procedure was uncomplicated, his postprocedural echo shows LVEF 50-55% with no central AI and only trivial paravalvular leak. The patient has been doing well, he started to walk about 20-30 minutes a day, he is  going to start cardiac rehabilitation on March 1. He denies any palpitations, no dizziness, no syncope, no orthopnea, no lower extremity edema. He's complaining is profound fatigue, lack of energy and easy bruising.  Prior CV studies that were reviewed today include:    PCI 04/30/16 Rotational atherectomy and stenting with a 3 x 12 mm Synergy DES to the mid RCA Recommendations: Triple therapy with aspirin 81 mg, Plavix 75 mg, and apixaban 30 days, then discontinue aspirin. Plans for TAVR in approximately 3-4 weeks.  Carotid US 03/18/16 R 1-39; L 40-59  R/L [facility] 01/17/16 LAD proximal 40 LCx mid 70, OM1 80 RCA proximal 20, mid 80 EF 55-65 Moderate to severe aortic stenosis with AVA 1.04, peak gradient 26, mean gradient 25.1  Echo 10/10/15 Mild LVH, EF 60-65, normal wall motion, grade 1 diastolic dysfunction, moderate aortic stenosis (mean 25, peak 60), trivial MR, mild LAE, mild TR  Past Medical History:  Diagnosis Date  . Age-related macular degeneration   . Allergic rhinitis   . Aortic valve stenosis   . Arthritis    "hands, lower back" (04/30/2016)  . Asthma   . Carotid artery obstruction   . Chronic lower back pain   . Coronary arteriosclerosis in native artery   . Diverticulitis of colon   . DJD (degenerative joint disease)   . Dyspnea    "constant but the degree changes"  . Dysthymia   . GERD (gastroesophageal reflux disease)   . History of elevated PSA 05/2009  . History of hiatal hernia   .  HLD (hyperlipidemia)   . Hypertension   . Nocturnal hypoxemia   . OSA (obstructive sleep apnea)    intolerant to CPAP  . Paroxysmal supraventricular tachycardia ([place])   . Pneumonia    "when I was a kid"  . Presence of permanent cardiac pacemaker   . Primary malignant neoplasm of bladder ([place])   . Prostate cancer [facility])     Past Surgical History:  Procedure Laterality Date  . APPENDECTOMY    . CARDIAC CATHETERIZATION N/A 01/17/2016   Procedure: Right/Left Heart Cath and  Coronary Angiography;  Surgeon: Belva Crome, MD;  Location: [place] [facility];  Service: Cardiovascular;  Laterality: N/A;  . CARDIAC CATHETERIZATION N/A 04/30/2016   Procedure: Coronary/Graft Atherectomy;  Surgeon: Sherren Mocha, MD;  Location: [place] [facility];  Service: Cardiovascular;  Laterality: N/A;  . CAROTID ENDARTERECTOMY Bilateral   . CATARACT EXTRACTION W/ INTRAOCULAR LENS  IMPLANT, BILATERAL Bilateral   . COLON SURGERY  1981   Meckles Diverticulum with volvulus  . EP IMPLANTABLE DEVICE N/A 03/21/2016   Procedure: Pacemaker Implant;  Surgeon: Evans Lance, MD;  Location: [place] [facility];  Service: Cardiovascular;  Laterality: N/A;  . INGUINAL HERNIA REPAIR Right 2009  . INSERTION PROSTATE RADIATION SEED    . TEE WITHOUT CARDIOVERSION N/A 06/10/2016   Procedure: TRANSESOPHAGEAL ECHOCARDIOGRAM (TEE);  Surgeon: Sherren Mocha, MD;  Location: [place];  Service: Open Heart Surgery;  Laterality: N/A;  . TONSILLECTOMY  ~ 1947  . TRANSCATHETER AORTIC VALVE REPLACEMENT, TRANSFEMORAL N/A 06/10/2016   Procedure: TRANSCATHETER AORTIC VALVE REPLACEMENT, TRANSFEMORAL;  Surgeon: Sherren Mocha, MD;  Location: [place];  Service: Open Heart Surgery;  Laterality: N/A;    Current Medications: Current Meds  Medication Sig  . acetaminophen (TYLENOL) 500 MG tablet Take 500 mg by mouth every 6 (six) hours as needed for mild pain.  Marland Kitchen amiodarone (PACERONE) 200 MG tablet Take 1 tablet (200 mg total) by mouth daily.  Marland Kitchen apixaban (ELIQUIS) 2.5 MG TABS tablet Take 1 tablet (2.5 mg total) by mouth 2 (two) times daily.  Marland Kitchen atorvastatin (LIPITOR) 40 MG tablet Take 1 tablet (40 mg total) by mouth daily. (Patient taking differently: Take 40 mg by mouth daily at 6 PM. )  . cholecalciferol (VITAMIN D) 1000 units tablet Take 1,000 Units by mouth 2 (two) times a week.   . clopidogrel (PLAVIX) 75 MG tablet Take 1 tablet (75 mg total) by mouth daily. (Patient taking differently: Take 75 mg by mouth every evening. )    . Coenzyme Q10 (COQ-10 PO) Take 1 capsule by mouth 2 (two) times a week.   . latanoprost (XALATAN) 0.005 % ophthalmic solution Place 1 drop into both eyes at bedtime.  Marland Kitchen lisinopril (PRINIVIL,ZESTRIL) 20 MG tablet Take 1 tablet (20 mg total) by mouth daily.  . nitroGLYCERIN (NITROSTAT) 0.4 MG SL tablet Place 1 tablet (0.4 mg total) under the tongue every 5 (five) minutes as needed.  . timolol (TIMOPTIC) 0.5 % ophthalmic solution Place 1 drop into both eyes daily.     Allergies:   Ciprofloxacin; Hydrochlorothiazide; Sulfa antibiotics; Ace inhibitors; and Losartan   Social History   Social History  . Marital status: Married    Spouse name: N/A  . Number of children: N/A  . Years of education: N/A   Occupational History  . retired    Social History Main Topics  . Smoking status: Never Smoker  . Smokeless tobacco: Never Used  . Alcohol use No     Comment:  04/30/2016 "nothing in years"  . Drug use: No  . Sexual activity: No   Other Topics Concern  . None   Social History Narrative  . None     Family History:  The patient's family history includes Heart disease in his father and mother.   ROS:   Please see the history of present illness.    ROS All other systems reviewed and are negative.   EKGs/Labs/Other Test Reviewed:    EKG:  EKG is  ordered today.  The ekg ordered today demonstrates NSR, HR 71, LBBB  Recent Labs: 03/18/2016: B Natriuretic Peptide 38.4; TSH 2.194 06/06/2016: ALT 32 06/11/2016: BUN 22; Creatinine, Ser 1.29; Hemoglobin 11.1; Magnesium 2.3; Platelets 189; Potassium 4.0; Sodium 138   Recent Lipid Panel    Component Value Date/Time   CHOL 147 03/20/2016 0422   TRIG 54 03/20/2016 0422   HDL 61 03/20/2016 0422   CHOLHDL 2.4 03/20/2016 0422   VLDL 11 03/20/2016 0422   LDLCALC 75 03/20/2016 0422     Physical Exam:    VS:  BP (!) 142/70   Pulse 86   Ht 5\' 7"  (1.702 m)   Wt 135 lb (61.2 kg)   BMI 21.14 kg/m   BP 144/64, HR 72  Wt Readings from  Last 3 Encounters:  07/23/16 135 lb (61.2 kg)  07/04/16 130 lb 12.8 oz (59.3 kg)  07/01/16 132 lb 6.4 oz (60.1 kg)     Physical Exam  Constitutional: He is oriented to person, place, and time. He appears well-developed and well-nourished. No distress.  HENT:  Head: Normocephalic and atraumatic.  Eyes: No scleral icterus.  Neck: No JVD present.  Cardiovascular: Normal rate and regular rhythm.   Murmur heard.  Harsh crescendo-decrescendo systolic murmur is present with a grade of 3/6  at the upper right sternal border Pulmonary/Chest: He has no wheezes. He has no rales.  Abdominal: Soft. There is no tenderness.  Musculoskeletal: He exhibits no edema.  R groin without hematoma or bruit  Neurological: He is alert and oriented to person, place, and time.  Skin: Skin is warm and dry.  Psychiatric: He has a normal mood and affect.   TTE: 07/04/2016  Left ventricle: The cavity size was normal. Wall thickness was   increased in a pattern of mild LVH. Systolic function was normal.   The estimated ejection fraction was in the range of 50% to 55%.   Doppler parameters are consistent with abnormal left ventricular   relaxation (grade 1 diastolic dysfunction). - Aortic valve: Post TAVR with trivial periprosthetic   regurgitation. - Aorta: Post TAVR with stable gradients and only trivial   perivalvular regurgitation. - Mitral valve: There was mild regurgitation. - Left atrium: The atrium was moderately dilated. - Atrial septum: No defect or patent foramen ovale was identified.    ASSESSMENT:    1. Severe aortic stenosis   2. S/P TAVR (transcatheter aortic valve replacement)   3. Tachycardia-bradycardia syndrome ([place])   4. Cardiac pacemaker in situ   5. Persistent atrial fibrillation ([employer])   6. Hypertensive heart disease with heart failure ([place])   7. Mixed hyperlipidemia   8. Chronic diastolic CHF (congestive heart failure) ([place])   9. Coronary artery disease involving native coronary  artery of native heart without angina pectoris    PLAN:    In order of problems listed above:  1. CAD - s/p PCI with DES to RCA.  He denies further angina since his PCI. Residual disease will be  treated medically. Continue Plavix, statin, aspirin was discontinued. The patient complains of significant bruising, however he needs to continue Plavix to 10/31/2016.  2. Aortic stenosis -  status post TAVR on 06/11/2015, with great results normal transaortic gradients only mild paravalvular leak. He is about to start cardiac rehabilitation.  3. Persistent AF - Maintaining normal sinus rhythm. He remains on amiodarone and Eliquis. No more epistaxis, hemoglobin stable.   4. Chronic diastolic CHF - Volume appears stable. He is NYHA has improved from 2b ->2a.  5. HTN - controlled.   6. HL - Continue statin.  7. PPM - FU with EP as planned.   Medication Adjustments/Labs and Tests Ordered: Current medicines are reviewed at length with the patient today.  Concerns regarding medicines are outlined above.  Medication changes, Labs and Tests ordered today are outlined in the Patient Instructions noted below. Patient Instructions  Medication Instructions:   Your physician recommends that you continue on your current medications as directed. Please refer to the Current Medication list given to you today.    Follow-Up:  3 MONTHS WITH DR Meda Coffee       If you need a refill on your cardiac medications before your next appointment, please call your pharmacy.    Signed, Ena Dawley, MD  07/23/2016 11:07 AM    [address], [place], [place]  [address] Phone: [phone]; Fax: [phone]

## 2016-07-23 NOTE — Telephone Encounter (Signed)
Pulled pt's information through [employer] to verify patients Insurance Benefits through  [employer]. No Co-Pay, No Deductible, Out Of Pocket $5500.00, pt has met $721.96, pt's responsibility is $4778.04 No Co-Insurance, no limits. Reference # [id number]. KJ

## 2016-07-23 NOTE — Patient Instructions (Signed)
Medication Instructions:   Your physician recommends that you continue on your current medications as directed. Please refer to the Current Medication list given to you today.     Follow-Up:  3 MONTHS WITH DR NELSON       If you need a refill on your cardiac medications before your next appointment, please call your pharmacy.   

## 2016-07-31 ENCOUNTER — Encounter (HOSPITAL_COMMUNITY): Payer: Self-pay

## 2016-07-31 ENCOUNTER — Encounter (HOSPITAL_COMMUNITY)
Admission: RE | Admit: 2016-07-31 | Discharge: 2016-07-31 | Disposition: A | Discharge status: Home / Self Care | Payer: Medicare Other | Source: Ambulatory Visit | Attending: Cardiovascular Disease | Admitting: Cardiovascular Disease

## 2016-07-31 VITALS — BP 142/80 | HR 65 | Ht 64.75 in | Wt 135.6 lb

## 2016-07-31 DIAGNOSIS — I350 Nonrheumatic aortic (valve) stenosis: Secondary | ICD-10-CM | POA: Diagnosis present

## 2016-07-31 DIAGNOSIS — Z952 Presence of prosthetic heart valve: Secondary | ICD-10-CM | POA: Diagnosis present

## 2016-07-31 NOTE — Progress Notes (Signed)
Cardiac Individual Treatment Plan  Patient Details  Name: Ryan [name] MRN: [id number] Date of Birth: 06/24/1929 Referring Provider:   Flowsheet Row CARDIAC REHAB PHASE II ORIENTATION from 07/31/2016 in [facility]  Referring Provider  MD Nelson,Katarina      Initial Encounter Date:  [facility] PHASE II ORIENTATION from 07/31/2016 in [facility]  Date  07/31/16  Referring Provider  MD Ena Dawley      Visit Diagnosis: S/P TAVR (transcatheter aortic valve replacement)  Patient's Home Medications on Admission:  Current Outpatient Prescriptions:  .  acetaminophen (TYLENOL) 500 MG tablet, Take 500 mg by mouth as needed for mild pain. , Disp: , Rfl:  .  amiodarone (PACERONE) 200 MG tablet, Take 1 tablet (200 mg total) by mouth daily., Disp: 90 tablet, Rfl: 3 .  apixaban (ELIQUIS) 2.5 MG TABS tablet, Take 1 tablet (2.5 mg total) by mouth 2 (two) times daily., Disp: 60 tablet, Rfl: 10 .  atorvastatin (LIPITOR) 40 MG tablet, Take 1 tablet (40 mg total) by mouth daily. (Patient taking differently: Take 40 mg by mouth daily at 6 PM. ), Disp: 30 tablet, Rfl: 5 .  cholecalciferol (VITAMIN D) 1000 units tablet, Take 1,000 Units by mouth 2 (two) times a week. , Disp: , Rfl:  .  clopidogrel (PLAVIX) 75 MG tablet, Take 1 tablet (75 mg total) by mouth daily., Disp: 90 tablet, Rfl: 3 .  Coenzyme Q10 (COQ-10 PO), Take 1 capsule by mouth 2 (two) times a week. , Disp: , Rfl:  .  latanoprost (XALATAN) 0.005 % ophthalmic solution, Place 1 drop into both eyes at bedtime., Disp: , Rfl:  .  lisinopril (PRINIVIL,ZESTRIL) 20 MG tablet, Take 1 tablet (20 mg total) by mouth daily., Disp: 90 tablet, Rfl: 3 .  nitroGLYCERIN (NITROSTAT) 0.4 MG SL tablet, Place 1 tablet (0.4 mg total) under the tongue every 5 (five) minutes as needed., Disp: 25 tablet, Rfl: 3 .  timolol (TIMOPTIC) 0.5 % ophthalmic solution, Place 1 drop into both eyes  daily., Disp: , Rfl: 1  Past Medical History: Past Medical History:  Diagnosis Date  . Age-related macular degeneration   . Allergic rhinitis   . Aortic valve stenosis   . Arthritis    "hands, lower back" (04/30/2016)  . Asthma   . Carotid artery obstruction   . Chronic lower back pain   . Coronary arteriosclerosis in native artery   . Diverticulitis of colon   . DJD (degenerative joint disease)   . Dyspnea    "constant but the degree changes"  . Dysthymia   . GERD (gastroesophageal reflux disease)   . History of elevated PSA 05/2009  . History of hiatal hernia   . HLD (hyperlipidemia)   . Hypertension   . Nocturnal hypoxemia   . OSA (obstructive sleep apnea)    intolerant to CPAP  . Paroxysmal supraventricular tachycardia ([place])   . Pneumonia    "when I was a kid"  . Presence of permanent cardiac pacemaker   . Primary malignant neoplasm of bladder ([place])   . Prostate cancer ([place])     Tobacco Use: History  Smoking Status  . Never Smoker  Smokeless Tobacco  . Never Used    Labs: Recent Review Flowsheet Data    Labs for ITP Cardiac and Pulmonary Rehab Latest Ref Rng & Units 06/10/2016 06/10/2016 06/10/2016 06/10/2016 06/10/2016   Cholestrol 0 - 200 mg/dL - - - - -   LDLCALC  0 - 99 mg/dL - - - - -   HDL >40 mg/dL - - - - -   Trlycerides <150 mg/dL - - - - -   Hemoglobin A1c 4.8 - 5.6 % - - - - -   PHART 7.350 - 7.450 - - - 7.460(H) 7.460(H)   PCO2ART 32.0 - 48.0 mmHg - - - 31.1(L) 31.1(L)   HCO3 20.0 - 28.0 mmol/L - - - 22.2 22.2   TCO2 0 - 100 mmol/L 24 23 24 23 23    ACIDBASEDEF 0.0 - 2.0 mmol/L - - - 1.0 1.0   O2SAT % - - - 96.0 96.0      Capillary Blood Glucose: No results found for: GLUCAP   Exercise Target Goals: Date: 07/31/16  Exercise Program Goal: Individual exercise prescription set with THRR, safety & activity barriers. Participant demonstrates ability to understand and report RPE using BORG scale, to self-measure pulse accurately, and to acknowledge  the importance of the exercise prescription.  Exercise Prescription Goal: Starting with aerobic activity 30 plus minutes a day, 3 days per week for initial exercise prescription. Provide home exercise prescription and guidelines that participant acknowledges understanding prior to discharge.  Activity Barriers & Risk Stratification:   6 Minute Walk:     6 Minute Walk    Row Name 07/31/16 1021 07/31/16 1345       6 Minute Walk   Phase Initial  -    Distance 1288 feet  -    Walk Time 6 minutes  -    # of Rest Breaks 0  -    MPH 2.4  -    METS 2.6  -    RPE 11  -    VO2 Peak 9  -    Symptoms No  -    Resting HR 65 bpm  -    Resting BP 174/90  -    Max Ex. HR 95 bpm  -    Max Ex. BP 170/88  -    2 Minute Post BP  - 142/80       Oxygen Initial Assessment:   Oxygen Re-Evaluation:   Oxygen Discharge (Final Oxygen Re-Evaluation):   Initial Exercise Prescription:     Initial Exercise Prescription - 07/31/16 1300      Date of Initial Exercise RX and Referring Provider   Date 07/31/16   Referring Provider MD Nelson,Katarina     Bike   Level 0.6   Minutes 10   METs 2.53     NuStep   Level 1   Minutes 10   METs 2     Track   Laps 10   Minutes 10   METs 2.74     Prescription Details   Frequency (times per week) 5   Duration Progress to 45 minutes of aerobic exercise without signs/symptoms of physical distress     Intensity   THRR 40-80% of Max Heartrate 54-109   Ratings of Perceived Exertion 11-13   Perceived Dyspnea 0-4     Progression   Progression Continue to progress workloads to maintain intensity without signs/symptoms of physical distress.     Resistance Training   Training Prescription Yes   Weight 2   Reps 10-15      Perform Capillary Blood Glucose checks as needed.  Exercise Prescription Changes:   Exercise Comments:   Exercise Goals and Review:      Exercise Goals    Row Name 07/31/16 [phone]  Exercise Goals    Increase Physical Activity Yes       Intervention Provide advice, education, support and counseling about physical activity/exercise needs.;Develop an individualized exercise prescription for aerobic and resistive training based on initial evaluation findings, risk stratification, comorbidities and participant's personal goals.       Expected Outcomes Achievement of increased cardiorespiratory fitness and enhanced flexibility, muscular endurance and strength shown through measurements of functional capacity and personal statement of participant.       Increase Strength and Stamina Yes       Intervention Provide advice, education, support and counseling about physical activity/exercise needs.;Develop an individualized exercise prescription for aerobic and resistive training based on initial evaluation findings, risk stratification, comorbidities and participant's personal goals.       Expected Outcomes Achievement of increased cardiorespiratory fitness and enhanced flexibility, muscular endurance and strength shown through measurements of functional capacity and personal statement of participant.          Exercise Goals Re-Evaluation :    Discharge Exercise Prescription (Final Exercise Prescription Changes):   Nutrition:  Target Goals: Understanding of nutrition guidelines, daily intake of sodium 1500mg , cholesterol 200mg , calories 30% from fat and 7% or less from saturated fats, daily to have 5 or more servings of fruits and vegetables.  Biometrics:     Pre Biometrics - 07/31/16 1639      Pre Biometrics   Waist Circumference 34 inches   Hip Circumference 36.5 inches   Waist to Hip Ratio 0.93 %   Triceps Skinfold 12 mm   % Body Fat 22.9 %   Grip Strength 32 kg   Flexibility 17 in   Single Leg Stand 9.65 seconds       Nutrition Therapy Plan and Nutrition Goals:   Nutrition Discharge: Nutrition Scores:   Nutrition Goals Re-Evaluation:   Nutrition Goals  Re-Evaluation:   Nutrition Goals Discharge (Final Nutrition Goals Re-Evaluation):   Psychosocial: Target Goals: Acknowledge presence or absence of significant depression and/or stress, maximize coping skills, provide positive support system. Participant is able to verbalize types and ability to use techniques and skills needed for reducing stress and depression.  Initial Review & Psychosocial Screening:   Quality of Life Scores:     Quality of Life - 07/31/16 1020      Quality of Life Scores   Health/Function Pre 20.67 %   Socioeconomic Pre 21.57 %   Psych/Spiritual Pre 20.29 %   Family Pre 30 %   GLOBAL Pre 22.15 %      PHQ-9: Recent Review Flowsheet Data    There is no flowsheet data to display.     Interpretation of Total Score  Total Score Depression Severity:  1-4 = Minimal depression, 5-9 = Mild depression, 10-14 = Moderate depression, 15-19 = Moderately severe depression, 20-27 = Severe depression   Psychosocial Evaluation and Intervention:   Psychosocial Re-Evaluation:   Psychosocial Discharge (Final Psychosocial Re-Evaluation):   Vocational Rehabilitation: Provide vocational rehab assistance to qualifying candidates.   Vocational Rehab Evaluation & Intervention:     Vocational Rehab - 07/31/16 1629      Initial Vocational Rehab Evaluation & Intervention   Assessment shows need for Vocational Rehabilitation No     Vocational Rehab Re-Evaulation   Comments --  Ryan [name] is retired      Education: Education Goals: Education classes will be provided on a weekly basis, covering required topics. Participant will state understanding/return demonstration of topics presented.  Learning Barriers/Preferences:     Learning Barriers/Preferences -  07/31/16 0850      Learning Barriers/Preferences   Learning Barriers Sight   Learning Preferences Written Material      Education Topics: Count Your Pulse:  -Group instruction provided by verbal  instruction, demonstration, patient participation and written materials to support subject.  Instructors address importance of being able to find your pulse and how to count your pulse when at home without a heart monitor.  Patients get hands on experience counting their pulse with staff help and individually.   Heart Attack, Angina, and Risk Factor Modification:  -Group instruction provided by verbal instruction, video, and written materials to support subject.  Instructors address signs and symptoms of angina and heart attacks.    Also discuss risk factors for heart disease and how to make changes to improve heart health risk factors.   Functional Fitness:  -Group instruction provided by verbal instruction, demonstration, patient participation, and written materials to support subject.  Instructors address safety measures for doing things around the house.  Discuss how to get up and down off the floor, how to pick things up properly, how to safely get out of a chair without assistance, and balance training.   Meditation and Mindfulness:  -Group instruction provided by verbal instruction, patient participation, and written materials to support subject.  Instructor addresses importance of mindfulness and meditation practice to help reduce stress and improve awareness.  Instructor also leads participants through a meditation exercise.    Stretching for Flexibility and Mobility:  -Group instruction provided by verbal instruction, patient participation, and written materials to support subject.  Instructors lead participants through series of stretches that are designed to increase flexibility thus improving mobility.  These stretches are additional exercise for major muscle groups that are typically performed during regular warm up and cool down.   Hands Only CPR Anytime:  -Group instruction provided by verbal instruction, video, patient participation and written materials to support subject.   Instructors co-teach with AHA video for hands only CPR.  Participants get hands on experience with mannequins.   Nutrition I class: Heart Healthy Eating:  -Group instruction provided by PowerPoint slides, verbal discussion, and written materials to support subject matter. The instructor gives an explanation and review of the Therapeutic Lifestyle Changes diet recommendations, which includes a discussion on lipid goals, dietary fat, sodium, fiber, plant stanol/sterol esters, sugar, and the components of a well-balanced, healthy diet.   Nutrition II class: Lifestyle Skills:  -Group instruction provided by PowerPoint slides, verbal discussion, and written materials to support subject matter. The instructor gives an explanation and review of label reading, grocery shopping for heart health, heart healthy recipe modifications, and ways to make healthier choices when eating out.   Diabetes Question & Answer:  -Group instruction provided by PowerPoint slides, verbal discussion, and written materials to support subject matter. The instructor gives an explanation and review of diabetes co-morbidities, pre- and post-prandial blood glucose goals, pre-exercise blood glucose goals, signs, symptoms, and treatment of hypoglycemia and hyperglycemia, and foot care basics.   Diabetes Blitz:  -Group instruction provided by PowerPoint slides, verbal discussion, and written materials to support subject matter. The instructor gives an explanation and review of the physiology behind type 1 and type 2 diabetes, diabetes medications and rational behind using different medications, pre- and post-prandial blood glucose recommendations and Hemoglobin A1c goals, diabetes diet, and exercise including blood glucose guidelines for exercising safely.    Portion Distortion:  -Group instruction provided by PowerPoint slides, verbal discussion, written materials, and food models to  support subject matter. The instructor gives an  explanation of serving size versus portion size, changes in portions sizes over the last 20 years, and what consists of a serving from each food group.   Stress Management:  -Group instruction provided by verbal instruction, video, and written materials to support subject matter.  Instructors review role of stress in heart disease and how to cope with stress positively.     Exercising on Your Own:  -Group instruction provided by verbal instruction, power point, and written materials to support subject.  Instructors discuss benefits of exercise, components of exercise, frequency and intensity of exercise, and end points for exercise.  Also discuss use of nitroglycerin and activating EMS.  Review options of places to exercise outside of rehab.  Review guidelines for sex with heart disease.   Cardiac Drugs I:  -Group instruction provided by verbal instruction and written materials to support subject.  Instructor reviews cardiac drug classes: antiplatelets, anticoagulants, beta blockers, and statins.  Instructor discusses reasons, side effects, and lifestyle considerations for each drug class.   Cardiac Drugs II:  -Group instruction provided by verbal instruction and written materials to support subject.  Instructor reviews cardiac drug classes: angiotensin converting enzyme inhibitors (ACE-I), angiotensin II receptor blockers (ARBs), nitrates, and calcium channel blockers.  Instructor discusses reasons, side effects, and lifestyle considerations for each drug class.   Anatomy and Physiology of the Circulatory System:  -Group instruction provided by verbal instruction, video, and written materials to support subject.  Reviews functional anatomy of heart, how it relates to various diagnoses, and what role the heart plays in the overall system.   Knowledge Questionnaire Score:     Knowledge Questionnaire Score - 07/31/16 1020      Knowledge Questionnaire Score   Pre Score 17/24      Core  Components/Risk Factors/Patient Goals at Admission:     Personal Goals and Risk Factors at Admission - 07/31/16 1628      Core Components/Risk Factors/Patient Goals on Admission   Hypertension Yes   Intervention Provide education on lifestyle modifcations including regular physical activity/exercise, weight management, moderate sodium restriction and increased consumption of fresh fruit, vegetables, and low fat dairy, alcohol moderation, and smoking cessation.;Monitor prescription use compliance.   Expected Outcomes Short Term: Continued assessment and intervention until BP is < 140/68mm HG in hypertensive participants. < 130/26mm HG in hypertensive participants with diabetes, heart failure or chronic kidney disease.;Long Term: Maintenance of blood pressure at goal levels.   Lipids Yes   Intervention Provide education and support for participant on nutrition & aerobic/resistive exercise along with prescribed medications to achieve LDL 70mg , HDL >40mg .   Expected Outcomes Short Term: Participant states understanding of desired cholesterol values and is compliant with medications prescribed. Participant is following exercise prescription and nutrition guidelines.;Long Term: Cholesterol controlled with medications as prescribed, with individualized exercise RX and with personalized nutrition plan. Value goals: LDL < 70mg , HDL > 40 mg.   Personal Goal Other Yes   Personal Goal Increase grip strength in order to tie shoes again   Intervention Provide exercise programming to assist with increasing strength, functional mobility and exercise capacity.   Expected Outcomes Pt will be able to increase grip strength and return to ADL's without fatigue      Core Components/Risk Factors/Patient Goals Review:    Core Components/Risk Factors/Patient Goals at Discharge (Final Review):    ITP Comments:     ITP Comments    Row Name 07/31/16 [phone]  ITP Comments Dr. Loreta Ave, [place]  Director          Comments: Ryan [name] attended orientation from 0800 to 1000 to review rules and guidelines for program. Completed 6 minute walk test, Intitial ITP, and exercise prescription.  . Telemetry-Atrial paced rhythm with a bundle branch block.  Asymptomatic. Ryan [name] initial  Systolic blood pressure was in the 160's after completing the walk test Ryan [name]'s  Exit blood pressure was 142/80. Will continue to monitor blood pressure.Barnet Pall, RN,BSN 07/31/2016 4:45 PM

## 2016-07-31 NOTE — Progress Notes (Signed)
Cardiac Rehab Medication Review by a Pharmacist  Does the patient  feel that his/her medications are working for him/her?  yes  Has the patient been experiencing any side effects to the medications prescribed?  Yes, patient is experiencing dry cough which has been noted in the allergy section.   Does the patient measure his/her own blood pressure or blood glucose at home?  yes   Does the patient have any problems obtaining medications due to transportation or finances?   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 He is well aware of his medications. We reviewed indications, potential side effects, and the proper administration instructions for nitrostat. He does report dry cough throughout the day with random 'coughing fits.' This intolerance is noted. Will alert nurse.     Ryan [name], Ryan [name], PharmD Clinical Pharmacy Resident [phone] (Pager) 07/31/2016 8:16 AM

## 2016-08-04 ENCOUNTER — Telehealth: Payer: Self-pay | Admitting: Cardiology

## 2016-08-04 ENCOUNTER — Encounter (HOSPITAL_COMMUNITY)
Admission: RE | Admit: 2016-08-04 | Discharge: 2016-08-04 | Disposition: A | Discharge status: Home / Self Care | Payer: Medicare Other | Source: Ambulatory Visit | Attending: Cardiology | Admitting: Cardiology

## 2016-08-04 DIAGNOSIS — I350 Nonrheumatic aortic (valve) stenosis: Secondary | ICD-10-CM | POA: Diagnosis not present

## 2016-08-04 DIAGNOSIS — Z952 Presence of prosthetic heart valve: Secondary | ICD-10-CM

## 2016-08-04 NOTE — Telephone Encounter (Signed)
Called Maria at cardiac rehab.  She stated patient's BP increased when patient was on treadmill 166/80. She had patient rest and his BP went down to 122/80. Verdis Frederickson had patient walk the track and BP was elevated again. Verdis Frederickson had patient rest, and currently his BP is 142/80 HR 74. Verdis Frederickson stated patient has chronic pain as well. Verdis Frederickson stated she would wait to hear from Dr. Meda Coffee before she schedules patient for his next rehab appt. Verdis Frederickson suggested that parameters for BP when exercising at cardiac rehab would be helpful. Will forward to Dr. Meda Coffee for advisement.

## 2016-08-04 NOTE — Telephone Encounter (Signed)
BP up to 180 mmHg is ok during exercise

## 2016-08-04 NOTE — Telephone Encounter (Signed)
New message    Pt c/o BP issue: STAT if pt c/o blurred vision, one-sided weakness or slurred speech  1. What are your last 5 BP readings?came in 140/84 170/80  2. Are you having any other symptoms (ex. Dizziness, headache, blurred vision, passed out)? no  3. What is your BP issue? bp is high when he does exercise test. Verdis Frederickson at cardiac rehab stated she had him stop exercise and it's coming down. She would like a call back

## 2016-08-04 NOTE — Progress Notes (Signed)
Daily Session Note  Patient Details  Name: Ryan [name] MRN: [id number] Date of Birth: 07/01/1929 Referring Provider:   Flowsheet Row CARDIAC REHAB PHASE II ORIENTATION from 07/31/2016 in [facility]  Referring Provider  MD Ena Dawley      Encounter Date: 08/04/2016  Check In:     Session Check In - 08/04/16 1234      Pain Assessment   Currently in Pain? Yes   Pain Score 5    Pain Location Knee   Pain Type Chronic pain   Pain Onset More than a month ago   Pain Frequency Intermittent      Capillary Blood Glucose: No results found for this or any previous visit (from the past 24 hour(s)).    History  Smoking Status  . Never Smoker  Smokeless Tobacco  . Never Used    Goals Met:  No report of cardiac concerns or symptoms  Goals Unmet:  BP  Comments: Today was Ryan [name]'s first day of exercise at cardiac rehab. Entry blood pressure 144/80 with a heart rate of 72. Telemetry rhythm A paced. Blood pressure 172/60 on the nustep with a heart rate of 96. Exercise stopped Resting recheck blood pressure was 138/84 the 128/80. Ryan [name] has chronic knee pain and wears a back brace. Ryan [name] reported that his knee pain is a 5/10. I had Ryan [name] walk on the walking track. Patient used a cane for stability. Blood pressure noted at 166/80 on the walking track after resting Ryan [name] recheck blood pressure was noted at 138/60 with a heart rate of 70. Ryan [name] called and notified at Dr Francesca Oman office. Will fax exercise flow sheets to Dr. Francesca Oman office for review with today's blood pressure and blood pressures from Thursday. Will get further instructions on Ryan Ragle's blood pressure parameters before resuming exercise. Ryan Sers [name] called and notified.Will fax exercise flow sheets to Dr. Francesca Oman office for review with today's blood pressure and last Thursday's blood pressures from orientation. Ryan Pall, [name],BSN 08/04/2016 12:36 PM Dr.  Fransico Him is Medical Director for Cardiac Rehab at [facility].

## 2016-08-04 NOTE — Telephone Encounter (Signed)
Informed Maria RN at Cardiac Rehab that per Dr Meda Coffee, acceptable BP during exertional phase of rehab is 388 mmHg systolic,  and during the resting phase it should be 875 mmHg systolic and lower.  Verdis Frederickson RN verbalized understanding and agrees with this plan.  Verdis Frederickson RN will be faxing parameters over to Dr Meda Coffee to sign off.

## 2016-08-05 ENCOUNTER — Ambulatory Visit (HOSPITAL_COMMUNITY): Payer: Medicare Other

## 2016-08-05 ENCOUNTER — Telehealth: Payer: Self-pay | Admitting: Cardiology

## 2016-08-05 MED ORDER — CARVEDILOL 6.25 MG PO TABS: 6.2500 mg | ORAL_TABLET | Freq: Two times a day (BID) | ORAL | 2 refills | Status: DC

## 2016-08-05 NOTE — Telephone Encounter (Signed)
New message    Pt is calling requesting a call back from RN about phone call that was received yesterday.

## 2016-08-05 NOTE — Telephone Encounter (Signed)
Spoke with the pt and informed him that per Dr Meda Coffee, she recommends that he discontinue his lisinopril and start taking new med carvedilol 6.25 mg po bid.  Confirmed the pharmacy of choice with the pt.  Advised the pt to take this one dose at breakfast and the second dose at dinner time.  Pt verbalized understanding and agrees with this plan.

## 2016-08-05 NOTE — Telephone Encounter (Signed)
Pt calling into the office to let Dr Meda Coffee know that overtime, since he's been taking Lisinopril, he has noted worsening with coughing and at sometimes "feeling wheezy." Pt states that yesterday, he stopped taking this medication.  Pt would like for Dr Meda Coffee to advise on a different regimen in place of lisinopril.  Advised the pt to continue holding lisinopril, and I will update this medication as an allergy in his chart.  Pt states he has multiple intolerances to ace inhibitors and losartan, as indicated in his chart.  Pt states with the stop of lisinopril, the cough slightly improved.  Pt voiced he has no sob at this time, and is in no acute distress. Informed the pt that Dr Meda Coffee is out of the office today, but I will route this message to her for further review and recommendation, and follow-up with the pt shortly thereafter. Pt verbalized understanding and agrees with this plan.

## 2016-08-05 NOTE — Telephone Encounter (Signed)
Please discontinue and try carvedilol 6.25 mg po BID instead

## 2016-08-06 ENCOUNTER — Encounter: Payer: Self-pay | Admitting: Cardiovascular Disease

## 2016-08-06 ENCOUNTER — Encounter (HOSPITAL_COMMUNITY): Payer: Medicare Other

## 2016-08-06 DIAGNOSIS — H401213 Low-tension glaucoma, right eye, severe stage: Secondary | ICD-10-CM | POA: Diagnosis not present

## 2016-08-06 DIAGNOSIS — H401222 Low-tension glaucoma, left eye, moderate stage: Secondary | ICD-10-CM | POA: Diagnosis not present

## 2016-08-06 DIAGNOSIS — H353131 Nonexudative age-related macular degeneration, bilateral, early dry stage: Secondary | ICD-10-CM | POA: Diagnosis not present

## 2016-08-06 DIAGNOSIS — H2513 Age-related nuclear cataract, bilateral: Secondary | ICD-10-CM | POA: Diagnosis not present

## 2016-08-08 ENCOUNTER — Encounter (HOSPITAL_COMMUNITY)
Admission: RE | Admit: 2016-08-08 | Discharge: 2016-08-08 | Disposition: A | Discharge status: Home / Self Care | Payer: Medicare Other | Source: Ambulatory Visit | Attending: Cardiology | Admitting: Cardiology

## 2016-08-08 DIAGNOSIS — Z952 Presence of prosthetic heart valve: Secondary | ICD-10-CM

## 2016-08-08 DIAGNOSIS — I350 Nonrheumatic aortic (valve) stenosis: Secondary | ICD-10-CM | POA: Diagnosis not present

## 2016-08-08 NOTE — Progress Notes (Signed)
Mr Herd returned to exercise today. Medication changes noted. Bob's blood pressures were much improved. Will continue to monitor the patient throughout  the program.Frida Wahlstrom Venetia Maxon, RN,BSN 08/08/2016 5:32 PM

## 2016-08-11 ENCOUNTER — Encounter (HOSPITAL_COMMUNITY)
Admission: RE | Admit: 2016-08-11 | Discharge: 2016-08-11 | Disposition: A | Discharge status: Home / Self Care | Payer: Medicare Other | Source: Ambulatory Visit | Attending: Cardiology | Admitting: Cardiology

## 2016-08-11 ENCOUNTER — Encounter (HOSPITAL_COMMUNITY): Payer: Medicare Other

## 2016-08-11 DIAGNOSIS — I350 Nonrheumatic aortic (valve) stenosis: Secondary | ICD-10-CM | POA: Diagnosis not present

## 2016-08-11 DIAGNOSIS — Z952 Presence of prosthetic heart valve: Secondary | ICD-10-CM

## 2016-08-13 ENCOUNTER — Encounter (HOSPITAL_COMMUNITY): Payer: Medicare Other

## 2016-08-13 ENCOUNTER — Encounter (HOSPITAL_COMMUNITY)
Admission: RE | Admit: 2016-08-13 | Discharge: 2016-08-13 | Disposition: A | Discharge status: Home / Self Care | Payer: Medicare Other | Source: Ambulatory Visit | Attending: Cardiology | Admitting: Cardiology

## 2016-08-13 DIAGNOSIS — Z952 Presence of prosthetic heart valve: Secondary | ICD-10-CM

## 2016-08-13 DIAGNOSIS — I350 Nonrheumatic aortic (valve) stenosis: Secondary | ICD-10-CM | POA: Diagnosis not present

## 2016-08-13 NOTE — Progress Notes (Signed)
Reviewed home exercise program with pt.  Discussed mode/frequency of exercise, THRR, RPE scale and weather conditions for exercising outdoors.  Also discussed signs and symptoms, NTG use and when to call Dr./911.  Pt received copy of handout and verbalized understanding.  Cleda Mccreedy, [place] ACSM RCEP 08/13/2016 16:52

## 2016-08-15 ENCOUNTER — Encounter (HOSPITAL_COMMUNITY)
Admission: RE | Admit: 2016-08-15 | Discharge: 2016-08-15 | Disposition: A | Discharge status: Home / Self Care | Payer: Medicare Other | Source: Ambulatory Visit | Attending: Cardiology | Admitting: Cardiology

## 2016-08-15 ENCOUNTER — Encounter (HOSPITAL_COMMUNITY): Payer: Medicare Other

## 2016-08-15 ENCOUNTER — Telehealth (HOSPITAL_COMMUNITY): Payer: Self-pay | Admitting: *Deleted

## 2016-08-15 DIAGNOSIS — Z952 Presence of prosthetic heart valve: Secondary | ICD-10-CM

## 2016-08-15 DIAGNOSIS — I350 Nonrheumatic aortic (valve) stenosis: Secondary | ICD-10-CM | POA: Diagnosis not present

## 2016-08-15 NOTE — Progress Notes (Signed)
Discharge Summary  Patient Details  Name: Ryan [name] MRN: [id number] Date of Birth: 20-Apr-1930 Referring Provider:     CARDIAC REHAB PHASE II ORIENTATION from 07/31/2016 in [facility]  Referring Provider  MD Nelson,Katarina       Number of Visits: 6  Reason for Discharge:  Patient independent in their exercise. Early Exit:  Mr Broadus says he has equipment at home. Mr Saran will exercise on his own  Smoking History:  History  Smoking Status  . Never Smoker  Smokeless Tobacco  . Never Used    Diagnosis:  S/P TAVR (transcatheter aortic valve replacement)  ADL UCSD:   Initial Exercise Prescription:     Initial Exercise Prescription - 07/31/16 1300      Date of Initial Exercise RX and Referring Provider   Date 07/31/16   Referring Provider MD Nelson,Katarina     Bike   Level 0.6   Minutes 10   METs 2.53     NuStep   Level 1   Minutes 10   METs 2     Track   Laps 10   Minutes 10   METs 2.74     Prescription Details   Frequency (times per week) 5   Duration Progress to 45 minutes of aerobic exercise without signs/symptoms of physical distress     Intensity   THRR 40-80% of Max Heartrate 54-109   Ratings of Perceived Exertion 11-13   Perceived Dyspnea 0-4     Progression   Progression Continue to progress workloads to maintain intensity without signs/symptoms of physical distress.     Resistance Training   Training Prescription Yes   Weight 2   Reps 10-15      Discharge Exercise Prescription (Final Exercise Prescription Changes):     Exercise Prescription Changes - 08/18/16 1300      Response to Exercise   Blood Pressure (Admit) 132/78   Blood Pressure (Exercise) 132/62   Blood Pressure (Exit) 118/72   Heart Rate (Admit) 69 bpm   Heart Rate (Exercise) 97 bpm   Heart Rate (Exit) 70 bpm   Rating of Perceived Exertion (Exercise) 13   Duration Continue with 30 min of aerobic exercise without  signs/symptoms of physical distress.   Intensity THRR unchanged     Progression   Progression Continue to progress workloads to maintain intensity without signs/symptoms of physical distress.   Average METs 2.5     Resistance Training   Training Prescription Yes   Weight 5lbs   Reps 10-15   Time 10 Minutes     Bike   Level 0.6   Minutes 10   METs 2.55     NuStep   Level 3   Minutes 10   METs 2.9     Track   Laps 6   Minutes 10   METs 2.04     Home Exercise Plan   Plans to continue exercise at Home (comment)   Frequency Add 4 additional days to program exercise sessions.   Initial Home Exercises Provided 08/15/16      Functional Capacity:     6 Minute Walk    Row Name 07/31/16 1021 07/31/16 1345       6 Minute Walk   Phase Initial  -    Distance 1288 feet  -    Walk Time 6 minutes  -    # of Rest Breaks 0  -    MPH 2.4  -  METS 2.6  -    RPE 11  -    VO2 Peak 9  -    Symptoms No  -    Resting HR 65 bpm  -    Resting BP 174/90  -    Max Ex. HR 95 bpm  -    Max Ex. BP 170/88  -    2 Minute Post BP  - 142/80       Psychological, QOL, Others - Outcomes: PHQ 2/9: Depression screen PHQ 2/9 08/04/2016  Decreased Interest 0  Down, Depressed, Hopeless 0  PHQ - 2 Score 0    Quality of Life:     Quality of Life - 07/31/16 1020      Quality of Life Scores   Health/Function Pre 20.67 %   Socioeconomic Pre 21.57 %   Psych/Spiritual Pre 20.29 %   Family Pre 30 %   GLOBAL Pre 22.15 %      Personal Goals: Goals established at orientation with interventions provided to work toward goal.     Personal Goals and Risk Factors at Admission - 07/31/16 1628      Core Components/Risk Factors/Patient Goals on Admission   Hypertension Yes   Intervention Provide education on lifestyle modifcations including regular physical activity/exercise, weight management, moderate sodium restriction and increased consumption of fresh fruit, vegetables, and low fat  dairy, alcohol moderation, and smoking cessation.;Monitor prescription use compliance.   Expected Outcomes Short Term: Continued assessment and intervention until BP is < 140/69mm HG in hypertensive participants. < 130/38mm HG in hypertensive participants with diabetes, heart failure or chronic kidney disease.;Long Term: Maintenance of blood pressure at goal levels.   Lipids Yes   Intervention Provide education and support for participant on nutrition & aerobic/resistive exercise along with prescribed medications to achieve LDL 70mg , HDL >40mg .   Expected Outcomes Short Term: Participant states understanding of desired cholesterol values and is compliant with medications prescribed. Participant is following exercise prescription and nutrition guidelines.;Long Term: Cholesterol controlled with medications as prescribed, with individualized exercise RX and with personalized nutrition plan. Value goals: LDL < 70mg , HDL > 40 mg.   Personal Goal Other Yes   Personal Goal Increase grip strength in order to tie shoes again   Intervention Provide exercise programming to assist with increasing strength, functional mobility and exercise capacity.   Expected Outcomes Pt will be able to increase grip strength and return to ADL's without fatigue       Personal Goals Discharge:   Nutrition & Weight - Outcomes:     Pre Biometrics - 07/31/16 1639      Pre Biometrics   Waist Circumference 34 inches   Hip Circumference 36.5 inches   Waist to Hip Ratio 0.93 %   Triceps Skinfold 12 mm   % Body Fat 22.9 %   Grip Strength 32 kg   Flexibility 17 in   Single Leg Stand 9.65 seconds       Nutrition:     Nutrition Therapy & Goals - 08/05/16 1511      Nutrition Therapy   Diet Therapeutic Lifestyle Changes     Personal Nutrition Goals   Nutrition Goal Pt to maintain his current wt around 135 lb.      Intervention Plan   Intervention Prescribe, educate and counsel regarding individualized specific  dietary modifications aiming towards targeted core components such as weight, hypertension, lipid management, diabetes, heart failure and other comorbidities.   Expected Outcomes Short Term Goal: Understand basic principles of  dietary content, such as calories, fat, sodium, cholesterol and nutrients.;Long Term Goal: Adherence to prescribed nutrition plan.      Nutrition Discharge:     Nutrition Assessments - 08/05/16 1511      MEDFICTS Scores   Pre Score 0      Education Questionnaire Score:     Knowledge Questionnaire Score - 07/31/16 1020      Knowledge Questionnaire Score   Pre Score 17/24      Mr Tsou attended exercise this morning and exercised without complaints. Mr Bacchi called back and said he will not be returning to exercise at cardiac rehab. Mr Quiles says he will continue exercise on his own at home.Barnet Pall, RN,BSN 08/21/2016 1:01 PM

## 2016-08-18 ENCOUNTER — Encounter (HOSPITAL_COMMUNITY): Payer: Medicare Other

## 2016-08-18 ENCOUNTER — Telehealth: Payer: Self-pay | Admitting: Physician Assistant

## 2016-08-18 DIAGNOSIS — Z7901 Long term (current) use of anticoagulants: Principal | ICD-10-CM

## 2016-08-18 DIAGNOSIS — Z5181 Encounter for therapeutic drug level monitoring: Secondary | ICD-10-CM

## 2016-08-18 NOTE — Telephone Encounter (Signed)
Pt states he is currently on Plavix and Eliquis. Pt states in the last week or so, when he wakes up in the morning, he notices spots of blood on the sheets, quarter size or less, where his right arm would have been during the night. Pt states he has bruising on his arms that is scabbing,  he feels is the source of the blood on his sheets. Pt denies active bleeding but may have a nose bleed when he blows his nose.  Pt advised I will forward to Dr Meda Coffee for review.

## 2016-08-18 NOTE — Telephone Encounter (Signed)
This call should had been routed to a Triage nurse due to pt c/o bleeding and is on Eliquis and Plavix. He recently saw Dr. Meda Coffee 07/23/16. I will route this call to the Triage nurse for further evaluation of the pt.

## 2016-08-18 NOTE — Telephone Encounter (Signed)
Follow Up:   Pt said Dr Burt Knack said if he had any type of flair up or problem to call Richardson Dopp. He says he is having unnecessary bleeding. Pt is very concerned,because he is on Eliquis and Plavix.Please call asap to advise.He also wants to know if he should follow up with Dr Erling Conte is his regular Cardiolgist?

## 2016-08-19 DIAGNOSIS — Z5181 Encounter for therapeutic drug level monitoring: Secondary | ICD-10-CM | POA: Insufficient documentation

## 2016-08-19 DIAGNOSIS — Z7901 Long term (current) use of anticoagulants: Principal | ICD-10-CM

## 2016-08-19 NOTE — Telephone Encounter (Signed)
He needs to be on both for now, please have him come to check CBC. Thank you.

## 2016-08-19 NOTE — Telephone Encounter (Signed)
Notified the pt that per Dr Meda Coffee, he needs to take both meds for now, and come in for a cbc w diff.  Scheduled the pt a lab appt for this Thursday 3/22 to recheck a cbc.  Pt verbalized understanding and agrees with this plan.

## 2016-08-20 ENCOUNTER — Encounter (HOSPITAL_COMMUNITY): Payer: Medicare Other

## 2016-08-21 ENCOUNTER — Other Ambulatory Visit: Payer: Medicare Other | Admitting: *Deleted

## 2016-08-21 DIAGNOSIS — Z5181 Encounter for therapeutic drug level monitoring: Secondary | ICD-10-CM

## 2016-08-21 DIAGNOSIS — Z7901 Long term (current) use of anticoagulants: Principal | ICD-10-CM

## 2016-08-21 LAB — CBC WITH DIFFERENTIAL/PLATELET
Basophils Absolute: 0.1 10*3/uL (ref 0.0–0.2)
Basos: 1 %
EOS (ABSOLUTE): 0.3 10*3/uL (ref 0.0–0.4)
Eos: 5 %
Hematocrit: 39.1 % (ref 37.5–51.0)
Hemoglobin: 12.6 g/dL — ABNORMAL LOW (ref 13.0–17.7)
Immature Grans (Abs): 0 10*3/uL (ref 0.0–0.1)
Immature Granulocytes: 1 %
Lymphocytes Absolute: 1 10*3/uL (ref 0.7–3.1)
Lymphs: 20 %
MCH: 27.4 pg (ref 26.6–33.0)
MCHC: 32.2 g/dL (ref 31.5–35.7)
MCV: 85 fL (ref 79–97)
Monocytes Absolute: 0.6 10*3/uL (ref 0.1–0.9)
Monocytes: 12 %
Neutrophils Absolute: 3 10*3/uL (ref 1.4–7.0)
Neutrophils: 61 %
Platelets: 226 10*3/uL (ref 150–379)
RBC: 4.6 x10E6/uL (ref 4.14–5.80)
RDW: 15.1 % (ref 12.3–15.4)
WBC: 5 10*3/uL (ref 3.4–10.8)

## 2016-08-22 ENCOUNTER — Encounter (HOSPITAL_COMMUNITY): Payer: Medicare Other

## 2016-08-25 ENCOUNTER — Encounter (HOSPITAL_COMMUNITY): Payer: Medicare Other

## 2016-08-25 ENCOUNTER — Telehealth: Payer: Self-pay | Admitting: *Deleted

## 2016-08-25 MED ORDER — METOPROLOL SUCCINATE ER 25 MG PO TB24: 25.0000 mg | ORAL_TABLET | Freq: Every day | ORAL | 4 refills | Status: DC

## 2016-08-25 NOTE — Telephone Encounter (Signed)
Notified the pt that per Dr Meda Coffee, she wants him to stop taking carvedilol first, and switch to Toprol XL 25 mg po daily.  Dr Meda Coffee wants to see if this improves his coughing and doesn't irritate his asthma, for this is beta selective.  Informed the pt that if he see's no improvement with Toprol XL, and this doesn't help his symptoms, then he should talk with his Ophthalmologist, for possible discontinuation of timolol gtts.  Confirmed the pharmacy of choice with the pt.  Pt verbalized understanding and agrees with this plan.  Will update carvedilol in pts allergies as an intolerance.

## 2016-08-25 NOTE — Telephone Encounter (Signed)
-----   Message from Dorothy Spark, MD sent at 08/23/2016 12:07 AM EDT ----- Please switch carvedilol to Toprol XL 25 mg po daily and if that doesn't help with his symptoms he should talk to his ophtalmologist.

## 2016-08-27 ENCOUNTER — Encounter (HOSPITAL_COMMUNITY): Payer: Medicare Other

## 2016-08-29 ENCOUNTER — Encounter (HOSPITAL_COMMUNITY): Payer: Medicare Other

## 2016-09-01 ENCOUNTER — Encounter (HOSPITAL_COMMUNITY): Payer: Medicare Other

## 2016-09-03 ENCOUNTER — Encounter (HOSPITAL_COMMUNITY): Payer: Medicare Other

## 2016-09-05 ENCOUNTER — Encounter (HOSPITAL_COMMUNITY): Payer: Medicare Other

## 2016-09-08 ENCOUNTER — Encounter (HOSPITAL_COMMUNITY): Payer: Medicare Other

## 2016-09-10 ENCOUNTER — Encounter (HOSPITAL_COMMUNITY): Payer: Medicare Other

## 2016-09-12 ENCOUNTER — Encounter (HOSPITAL_COMMUNITY): Payer: Medicare Other

## 2016-09-15 ENCOUNTER — Encounter (HOSPITAL_COMMUNITY): Payer: Medicare Other

## 2016-09-17 ENCOUNTER — Encounter (HOSPITAL_COMMUNITY): Payer: Medicare Other

## 2016-09-18 ENCOUNTER — Other Ambulatory Visit: Payer: Self-pay | Admitting: Cardiology

## 2016-09-18 NOTE — Telephone Encounter (Signed)
REFILL 

## 2016-09-19 ENCOUNTER — Encounter (HOSPITAL_COMMUNITY): Payer: Medicare Other

## 2016-09-22 ENCOUNTER — Encounter (HOSPITAL_COMMUNITY): Payer: Medicare Other

## 2016-09-24 ENCOUNTER — Encounter (HOSPITAL_COMMUNITY): Payer: Medicare Other

## 2016-09-26 ENCOUNTER — Encounter (HOSPITAL_COMMUNITY): Payer: Medicare Other

## 2016-09-29 ENCOUNTER — Encounter (HOSPITAL_COMMUNITY): Payer: Medicare Other

## 2016-09-30 ENCOUNTER — Ambulatory Visit (INDEPENDENT_AMBULATORY_CARE_PROVIDER_SITE_OTHER): Payer: Medicare Other | Admitting: *Deleted

## 2016-09-30 ENCOUNTER — Telehealth: Payer: Self-pay | Admitting: Cardiology

## 2016-09-30 DIAGNOSIS — I495 Sick sinus syndrome: Secondary | ICD-10-CM | POA: Diagnosis not present

## 2016-09-30 NOTE — Progress Notes (Signed)
Remote pacemaker transmission.   

## 2016-09-30 NOTE — Telephone Encounter (Signed)
Spoke with pt and reminded pt of remote transmission that is due today. Pt verbalized understanding.   

## 2016-10-01 ENCOUNTER — Encounter (HOSPITAL_COMMUNITY): Payer: Medicare Other

## 2016-10-01 ENCOUNTER — Encounter: Payer: Self-pay | Admitting: Cardiology

## 2016-10-01 LAB — CUP PACEART REMOTE DEVICE CHECK
Battery Impedance: 113 Ohm
Battery Remaining Longevity: 156 mo
Battery Voltage: 2.79 V
Brady Statistic AP VP Percent: 0 %
Brady Statistic AS VP Percent: 0 %
Implantable Lead Implant Date: 20171020
Implantable Lead Implant Date: 20171020
Implantable Lead Location: 753860
Implantable Lead Model: 5076
Implantable Lead Model: 5076
Implantable Pulse Generator Implant Date: 20171020
Lead Channel Impedance Value: 472 Ohm
Lead Channel Pacing Threshold Amplitude: 0.5 V
Lead Channel Setting Pacing Amplitude: 1.5 V
Lead Channel Setting Sensing Sensitivity: 4 mV
MDC IDC LEAD LOCATION: 753859
MDC IDC MSMT LEADCHNL RA PACING THRESHOLD PULSEWIDTH: 0.4 ms
MDC IDC MSMT LEADCHNL RV IMPEDANCE VALUE: 671 Ohm
MDC IDC MSMT LEADCHNL RV PACING THRESHOLD AMPLITUDE: 0.625 V
MDC IDC MSMT LEADCHNL RV PACING THRESHOLD PULSEWIDTH: 0.4 ms
MDC IDC SESS DTM: 20180501144916
MDC IDC SET LEADCHNL RV PACING AMPLITUDE: 2 V
MDC IDC SET LEADCHNL RV PACING PULSEWIDTH: 0.4 ms
MDC IDC STAT BRADY AP VS PERCENT: 95 %
MDC IDC STAT BRADY AS VS PERCENT: 5 %

## 2016-10-03 ENCOUNTER — Encounter (HOSPITAL_COMMUNITY): Payer: Medicare Other

## 2016-10-06 ENCOUNTER — Encounter (HOSPITAL_COMMUNITY): Payer: Medicare Other

## 2016-10-08 ENCOUNTER — Encounter (HOSPITAL_COMMUNITY): Payer: Medicare Other

## 2016-10-08 DIAGNOSIS — D692 Other nonthrombocytopenic purpura: Secondary | ICD-10-CM | POA: Diagnosis not present

## 2016-10-08 DIAGNOSIS — R143 Flatulence: Secondary | ICD-10-CM | POA: Diagnosis not present

## 2016-10-08 DIAGNOSIS — Z0000 Encounter for general adult medical examination without abnormal findings: Secondary | ICD-10-CM | POA: Diagnosis not present

## 2016-10-08 DIAGNOSIS — M179 Osteoarthritis of knee, unspecified: Secondary | ICD-10-CM | POA: Diagnosis not present

## 2016-10-10 ENCOUNTER — Encounter (HOSPITAL_COMMUNITY): Payer: Medicare Other

## 2016-10-13 ENCOUNTER — Encounter (HOSPITAL_COMMUNITY): Payer: Medicare Other

## 2016-10-15 ENCOUNTER — Encounter (HOSPITAL_COMMUNITY): Payer: Medicare Other

## 2016-10-15 DIAGNOSIS — C61 Malignant neoplasm of prostate: Secondary | ICD-10-CM | POA: Diagnosis not present

## 2016-10-17 ENCOUNTER — Encounter (HOSPITAL_COMMUNITY): Payer: Medicare Other

## 2016-10-20 ENCOUNTER — Encounter (HOSPITAL_COMMUNITY): Payer: Medicare Other

## 2016-10-20 DIAGNOSIS — C671 Malignant neoplasm of dome of bladder: Secondary | ICD-10-CM | POA: Diagnosis not present

## 2016-10-20 DIAGNOSIS — C61 Malignant neoplasm of prostate: Secondary | ICD-10-CM | POA: Diagnosis not present

## 2016-10-22 ENCOUNTER — Encounter (HOSPITAL_COMMUNITY): Payer: Medicare Other

## 2016-10-23 ENCOUNTER — Encounter: Payer: Self-pay | Admitting: Cardiology

## 2016-10-23 ENCOUNTER — Ambulatory Visit (INDEPENDENT_AMBULATORY_CARE_PROVIDER_SITE_OTHER): Payer: Medicare Other | Admitting: Cardiology

## 2016-10-23 VITALS — BP 126/62 | HR 80 | Ht 64.75 in | Wt 135.0 lb

## 2016-10-23 DIAGNOSIS — E782 Mixed hyperlipidemia: Secondary | ICD-10-CM

## 2016-10-23 DIAGNOSIS — Z952 Presence of prosthetic heart valve: Secondary | ICD-10-CM | POA: Diagnosis not present

## 2016-10-23 DIAGNOSIS — I480 Paroxysmal atrial fibrillation: Secondary | ICD-10-CM

## 2016-10-23 DIAGNOSIS — I2510 Atherosclerotic heart disease of native coronary artery without angina pectoris: Secondary | ICD-10-CM

## 2016-10-23 DIAGNOSIS — Z5181 Encounter for therapeutic drug level monitoring: Secondary | ICD-10-CM | POA: Diagnosis not present

## 2016-10-23 DIAGNOSIS — R5383 Other fatigue: Secondary | ICD-10-CM

## 2016-10-23 DIAGNOSIS — Z950 Presence of cardiac pacemaker: Secondary | ICD-10-CM | POA: Diagnosis not present

## 2016-10-23 DIAGNOSIS — Z7901 Long term (current) use of anticoagulants: Secondary | ICD-10-CM | POA: Diagnosis not present

## 2016-10-23 DIAGNOSIS — I350 Nonrheumatic aortic (valve) stenosis: Secondary | ICD-10-CM

## 2016-10-23 DIAGNOSIS — I5032 Chronic diastolic (congestive) heart failure: Secondary | ICD-10-CM | POA: Diagnosis not present

## 2016-10-23 MED ORDER — DILTIAZEM HCL ER COATED BEADS 120 MG PO CP24: 120.0000 mg | ORAL_CAPSULE | Freq: Every day | ORAL | 1 refills | Status: DC

## 2016-10-23 NOTE — Progress Notes (Signed)
Cardiology Office Note    Date:  10/23/2016   ID:  DUONG HAYDEL, DOB 1930-02-06, MRN [id number]  PCP:  Velna Hatchet, MD  Cardiologist:   Ena Dawley, MD   Chief complain: Fatigue  History of Present Illness:  Ryan [name] is a 83 y.o. male with a hx of carotid artery disease s/p bilateral CEA, CAD, aortic stenosis, HTN, HL, prior SVT, OSA, diastolic CHF, paroxysmal atrial fibrillation.  Echo in 5/17 demonstrated mod aortic stenosis with mean gradient 25 mmHg. In 8/17, he developed worsening dyspnea with exertion and fatigue. Cardiac catheterization demonstrated worsening CAD with 70% mid LCx, 80% OM1 and 80% mid RCA stenosis. PCI would be high risk. Medical therapy was recommended initially. He then saw Dr. Roxy Manns in surgical consultation. He was felt to have stage D severe symptomatic aortic stenosis. Work up for AVR vs TAVR was initiated.  He was then admitted in 10/17 with unstable angina in the setting of AF with RVR. This was complicated by tachybradycardia syndrome and he underwent pacemaker implantation with Dr. Lovena Le.   05/22/2016 - He returns for Cardiology follow up. In 11/17, he underwent successful rotational atherectomy and stenting of the RCA with a 3 x 12 mm Synergy DES on 04/30/16 by Dr Burt Knack. He was discharged on aspirin, Plavix and apixaban with plans to discontinue aspirin after 30 days. Apixaban to be stopped 5 days prior to TAVR. TAVR is scheduled for 06/11/2015. He states that he continues to have dyspnea on minimal exertion, dizziness with exertion. No syncope. He continues to have daily epistaxis.  07/23/2016 - 6 weeks post TAVR, he is procedure was uncomplicated, his postprocedural echo shows LVEF 50-55% with no central AI and only trivial paravalvular leak. The patient has been doing well, he started to walk about 20-30 minutes a day, he is going to start cardiac rehabilitation on March 1. He denies any palpitations, no dizziness, no syncope, no orthopnea, no  lower extremity edema. He's complaining is profound fatigue, lack of energy and easy bruising.  10/23/2016 - 3 months follow-up, the patient states that he continues to feel significantly tired, he exercises every day but has to take break and then has to take a nap after exercise. He is to exercise every day but his hospital course and procedure prolonged course and he'll lost his conditioning. He denies any chest pain shortness of breath, no palpitation no lower extremity edema. He has history of wheezing on a daily basis. He continues to bruise easily but nosebleeds have resolved.   Past Medical History:  Diagnosis Date  . Age-related macular degeneration   . Allergic rhinitis   . Aortic valve stenosis   . Arthritis    "hands, lower back" (04/30/2016)  . Asthma   . Carotid artery obstruction   . Chronic lower back pain   . Coronary arteriosclerosis in native artery   . Diverticulitis of colon   . DJD (degenerative joint disease)   . Dyspnea    "constant but the degree changes"  . Dysthymia   . GERD (gastroesophageal reflux disease)   . History of elevated PSA 05/2009  . History of hiatal hernia   . HLD (hyperlipidemia)   . Hypertension   . Nocturnal hypoxemia   . OSA (obstructive sleep apnea)    intolerant to CPAP  . Paroxysmal supraventricular tachycardia ([place])   . Pneumonia    "when I was a kid"  . Presence of permanent cardiac pacemaker   . Primary malignant neoplasm of  bladder ([place])   . Prostate cancer [facility])     Past Surgical History:  Procedure Laterality Date  . APPENDECTOMY    . CARDIAC CATHETERIZATION N/A 01/17/2016   Procedure: Right/Left Heart Cath and Coronary Angiography;  Surgeon: Belva Crome, MD;  Location: [facility];  Service: Cardiovascular;  Laterality: N/A;  . CARDIAC CATHETERIZATION N/A 04/30/2016   Procedure: Coronary/Graft Atherectomy;  Surgeon: Sherren Mocha, MD;  Location: [facility];  Service: Cardiovascular;  Laterality: N/A;    . CAROTID ENDARTERECTOMY Bilateral   . CATARACT EXTRACTION W/ INTRAOCULAR LENS  IMPLANT, BILATERAL Bilateral   . COLON SURGERY  1981   Meckles Diverticulum with volvulus  . EP IMPLANTABLE DEVICE N/A 03/21/2016   Procedure: Pacemaker Implant;  Surgeon: Evans Lance, MD;  Location: [facility];  Service: Cardiovascular;  Laterality: N/A;  . INGUINAL HERNIA REPAIR Right 2009  . INSERTION PROSTATE RADIATION SEED    . TEE WITHOUT CARDIOVERSION N/A 06/10/2016   Procedure: TRANSESOPHAGEAL ECHOCARDIOGRAM (TEE);  Surgeon: Sherren Mocha, MD;  Location: [facility];  Service: Open Heart Surgery;  Laterality: N/A;  . TONSILLECTOMY  ~ 1947  . TRANSCATHETER AORTIC VALVE REPLACEMENT, TRANSFEMORAL N/A 06/10/2016   Procedure: TRANSCATHETER AORTIC VALVE REPLACEMENT, TRANSFEMORAL;  Surgeon: Sherren Mocha, MD;  Location: [facility];  Service: Open Heart Surgery;  Laterality: N/A;   Current Medications: Outpatient Medications Prior to Visit  Medication Sig Dispense Refill  . acetaminophen (TYLENOL) 500 MG tablet Take 500 mg by mouth as needed for mild pain.     Marland Kitchen apixaban (ELIQUIS) 2.5 MG TABS tablet Take 1 tablet (2.5 mg total) by mouth 2 (two) times daily. 60 tablet 10  . atorvastatin (LIPITOR) 40 MG tablet TAKE 1 TABLET(40 MG) BY MOUTH DAILY 30 tablet 6  . cholecalciferol (VITAMIN D) 1000 units tablet Take 1,000 Units by mouth 2 (two) times a week.     . Coenzyme Q10 (COQ-10 PO) Take 1 capsule by mouth 2 (two) times a week.     . latanoprost (XALATAN) 0.005 % ophthalmic solution Place 1 drop into both eyes at bedtime.    . nitroGLYCERIN (NITROSTAT) 0.4 MG SL tablet Place 1 tablet (0.4 mg total) under the tongue every 5 (five) minutes as needed. 25 tablet 3  . amiodarone (PACERONE) 200 MG tablet Take 1 tablet (200 mg total) by mouth daily. 90 tablet 3  . clopidogrel (PLAVIX) 75 MG tablet Take 1 tablet (75 mg total) by mouth daily. 90 tablet 3  . metoprolol succinate (TOPROL XL) 25 MG 24 hr tablet Take 1 tablet  (25 mg total) by mouth daily. 30 tablet 4  . timolol (TIMOPTIC) 0.5 % ophthalmic solution Place 1 drop into both eyes daily.  1   No facility-administered medications prior to visit.      Allergies:   Ciprofloxacin; Hydrochlorothiazide; Sulfa antibiotics; Ace inhibitors; Losartan; Carvedilol; and Lisinopril   Social History   Social History  . Marital status: Married    Spouse name: N/A  . Number of children: N/A  . Years of education: N/A   Occupational History  . retired    Social History Main Topics  . Smoking status: Never Smoker  . Smokeless tobacco: Never Used  . Alcohol use No     Comment: 04/30/2016 "nothing in years"  . Drug use: No  . Sexual activity: No   Other Topics Concern  . None   Social History Narrative  . None     Family History:  The  patient's family history includes Heart disease in his father and mother.   ROS:   Please see the history of present illness.    ROS All other systems reviewed and are negative.   PHYSICAL EXAM:   VS:  BP 126/62   Pulse 80   Ht 5' 4.75" (1.645 m)   Wt 135 lb (61.2 kg)   BMI 22.64 kg/m    GEN: Well nourished, well developed, in no acute distress  HEENT: normal  Neck: no JVD, carotid bruits, or masses Cardiac: RRR; 2/6 systolic murmurs, rubs, or gallops,no edema  Respiratory:  clear to auscultation bilaterally, normal work of breathing GI: soft, nontender, nondistended, + BS MS: no deformity or atrophy  Skin: warm and dry, no rash Neuro:  Alert and Oriented x 3, Strength and sensation are intact Psych: euthymic mood, full affect  Wt Readings from Last 3 Encounters:  10/23/16 135 lb (61.2 kg)  07/31/16 135 lb 9.3 oz (61.5 kg)  07/23/16 135 lb (61.2 kg)    Studies/Labs Reviewed:   EKG:  EKG is not ordered today.    Recent Labs: 03/18/2016: B Natriuretic Peptide 38.4; TSH 2.194 06/06/2016: ALT 32 06/11/2016: BUN 22; Creatinine, Ser 1.29; Hemoglobin 11.1; Magnesium 2.3; Potassium 4.0; Sodium  138 08/21/2016: Platelets 226   Lipid Panel    Component Value Date/Time   CHOL 147 03/20/2016 0422   TRIG 54 03/20/2016 0422   HDL 61 03/20/2016 0422   CHOLHDL 2.4 03/20/2016 0422   VLDL 11 03/20/2016 0422   LDLCALC 75 03/20/2016 0422    Additional studies/ records that were reviewed today include:   - Left ventricle: The cavity size was normal. Wall thickness was   increased in a pattern of mild LVH. Systolic function was normal.   The estimated ejection fraction was in the range of 50% to 55%.   Doppler parameters are consistent with abnormal left ventricular   relaxation (grade 1 diastolic dysfunction). - Aortic valve: Post TAVR with trivial periprosthetic   regurgitation. - Aorta: Post TAVR with stable gradients and only trivial   perivalvular regurgitation. - Mitral valve: There was mild regurgitation. - Left atrium: The atrium was moderately dilated. - Atrial septum: No defect or patent foramen ovale was identified.    ASSESSMENT:    1. S/P TAVR (transcatheter aortic valve replacement)   2. PAF (paroxysmal atrial fibrillation) (HCC)      PLAN:  In order of problems listed above:  1. CAD - s/p PCI with DES to RCA.  He denies further angina since his PCI. Residual disease will be treated medically. Discontinue Plavix on 10/31/2016, continue Eliquis only.  2. Aortic stenosis -  status post TAVR on 06/11/2015, with great results normal transaortic gradients only mild paravalvular leak. Continue cardiac rehabilitation.He is encouraged to continue exercising on a daily basis as it might take longer to get back to the original functional capacity.  3. Paroxysmal AF - Maintaining normal sinus rhythm. He remains on amiodarone and Eliquis. No more epistaxis, hemoglobin stable. He is profoundly tired, I will discontinue amiodarone and Toprol-XL and start Cardizem 120 CD daily.  4. Chronic diastolic CHF - Volume appears stable. He is NYHA has improved from 2b ->2a.  5. HTN -  controlled.   6. HL - Continue statin.  7. PPM - FU with EP as planned.   Medication Adjustments/Labs and Tests Ordered: Current medicines are reviewed at length with the patient today.  Concerns regarding medicines are outlined above.  Medication changes, Labs and  Tests ordered today are listed in the Patient Instructions below. Patient Instructions  Medication Instructions:   STOP TAKING PLAVIX NOW  STOP TAKING AMIODARONE NOW  STOP TAKING TOPROL XL NOW  START TAKING CARDIZEM CD (DILTIAZEM CD) 120 MG BY MOUTH ONCE DAILY    Follow-Up:  ADD PATIENT ONTO November 24, 2016 AT DR Kalika Smay'S 10:20 AM SLOT, PER DR Meda Coffee       If you need a refill on your cardiac medications before your next appointment, please call your pharmacy.      Signed, Ena Dawley, MD  10/23/2016 9:58 AM    [address], [place], [place]  [address] Phone: [phone]; Fax: [phone]

## 2016-10-23 NOTE — Patient Instructions (Signed)
Medication Instructions:   STOP TAKING PLAVIX NOW  STOP TAKING AMIODARONE NOW  STOP TAKING TOPROL XL NOW  START TAKING CARDIZEM CD (DILTIAZEM CD) 120 MG BY MOUTH ONCE DAILY    Follow-Up:  ADD PATIENT ONTO November 24, 2016 AT DR NELSON'S 10:20 AM SLOT, PER DR Meda Coffee       If you need a refill on your cardiac medications before your next appointment, please call your pharmacy.

## 2016-10-24 ENCOUNTER — Encounter (HOSPITAL_COMMUNITY): Payer: Medicare Other

## 2016-10-27 ENCOUNTER — Encounter (HOSPITAL_COMMUNITY): Payer: Medicare Other

## 2016-10-29 ENCOUNTER — Encounter (HOSPITAL_COMMUNITY): Payer: Medicare Other

## 2016-10-31 ENCOUNTER — Encounter (HOSPITAL_COMMUNITY): Payer: Medicare Other

## 2016-11-03 ENCOUNTER — Encounter (HOSPITAL_COMMUNITY): Payer: Medicare Other

## 2016-11-03 NOTE — Addendum Note (Signed)
Addendum  created 11/03/16 1001 by Oleta Mouse, MD   Sign clinical note

## 2016-11-05 ENCOUNTER — Encounter (HOSPITAL_COMMUNITY): Payer: Medicare Other

## 2016-11-07 ENCOUNTER — Encounter (HOSPITAL_COMMUNITY): Payer: Medicare Other

## 2016-11-10 ENCOUNTER — Encounter (HOSPITAL_COMMUNITY): Payer: Medicare Other

## 2016-11-12 ENCOUNTER — Encounter (HOSPITAL_COMMUNITY): Payer: Medicare Other

## 2016-11-14 ENCOUNTER — Encounter (HOSPITAL_COMMUNITY): Payer: Medicare Other

## 2016-11-17 ENCOUNTER — Encounter (HOSPITAL_COMMUNITY): Payer: Medicare Other

## 2016-11-19 ENCOUNTER — Encounter (HOSPITAL_COMMUNITY): Payer: Medicare Other

## 2016-11-21 ENCOUNTER — Encounter (HOSPITAL_COMMUNITY): Payer: Medicare Other

## 2016-11-24 ENCOUNTER — Encounter (HOSPITAL_COMMUNITY): Payer: Medicare Other

## 2016-11-24 ENCOUNTER — Encounter: Payer: Self-pay | Admitting: Cardiology

## 2016-11-24 ENCOUNTER — Ambulatory Visit (INDEPENDENT_AMBULATORY_CARE_PROVIDER_SITE_OTHER): Payer: Medicare Other | Admitting: Cardiology

## 2016-11-24 VITALS — BP 142/64 | HR 84 | Ht 64.75 in | Wt 134.0 lb

## 2016-11-24 DIAGNOSIS — E782 Mixed hyperlipidemia: Secondary | ICD-10-CM

## 2016-11-24 DIAGNOSIS — I2510 Atherosclerotic heart disease of native coronary artery without angina pectoris: Secondary | ICD-10-CM | POA: Diagnosis not present

## 2016-11-24 DIAGNOSIS — R5383 Other fatigue: Secondary | ICD-10-CM

## 2016-11-24 DIAGNOSIS — I495 Sick sinus syndrome: Secondary | ICD-10-CM

## 2016-11-24 DIAGNOSIS — Z952 Presence of prosthetic heart valve: Secondary | ICD-10-CM | POA: Diagnosis not present

## 2016-11-24 DIAGNOSIS — Z950 Presence of cardiac pacemaker: Secondary | ICD-10-CM

## 2016-11-24 DIAGNOSIS — I480 Paroxysmal atrial fibrillation: Secondary | ICD-10-CM

## 2016-11-24 MED ORDER — ATORVASTATIN CALCIUM 20 MG PO TABS: 20.0000 mg | ORAL_TABLET | Freq: Every day | ORAL | 3 refills | Status: DC

## 2016-11-24 NOTE — Patient Instructions (Signed)
Medication Instructions:   DECREASE YOUR ATORVASTATIN (LIPITOR) TO 20 MG ONCE DAILY     Follow-Up:  4 MONTHS WITH DR Meda Coffee       If you need a refill on your cardiac medications before your next appointment, please call your pharmacy.

## 2016-11-24 NOTE — Progress Notes (Addendum)
Cardiology Office Note    Date:  11/24/2016   ID:  Ryan [name], DOB [birth year], MRN [id number]  PCP:  Velna Hatchet, MD  Cardiologist:   Ena Dawley, MD   Chief complain: Fatigue  History of Present Illness:  Ryan [name] is a 83 y.o. male with a hx of carotid artery disease s/p bilateral CEA, CAD, aortic stenosis, HTN, HL, prior SVT, OSA, diastolic CHF, paroxysmal atrial fibrillation.  Echo in 5/17 demonstrated mod aortic stenosis with mean gradient 25 mmHg. In 8/17, he developed worsening dyspnea with exertion and fatigue. Cardiac catheterization demonstrated worsening CAD with 70% mid LCx, 80% OM1 and 80% mid RCA stenosis. PCI would be high risk. Medical therapy was recommended initially. He then saw Dr. Roxy Manns in surgical consultation. He was felt to have stage D severe symptomatic aortic stenosis. Work up for AVR vs TAVR was initiated.  He was then admitted in 10/17 with unstable angina in the setting of AF with RVR. This was complicated by tachybradycardia syndrome and he underwent pacemaker implantation with Dr. Lovena Le.   05/22/2016 - He returns for Cardiology follow up. In 11/17, he underwent successful rotational atherectomy and stenting of the RCA with a 3 x 12 mm Synergy DES on 04/30/16 by Dr Burt Knack. He was discharged on aspirin, Plavix and apixaban with plans to discontinue aspirin after 30 days. Apixaban to be stopped 5 days prior to TAVR. TAVR is scheduled for 06/11/2015. He states that he continues to have dyspnea on minimal exertion, dizziness with exertion. No syncope. He continues to have daily epistaxis.  07/23/2016 - 6 weeks post TAVR, he is procedure was uncomplicated, his postprocedural echo shows LVEF 50-55% with no central AI and only trivial paravalvular leak. The patient has been doing well, he started to walk about 20-30 minutes a day, he is going to start cardiac rehabilitation on March 1. He denies any palpitations, no dizziness, no syncope, no orthopnea, no  lower extremity edema. He's complaining is profound fatigue, lack of energy and easy bruising.  10/23/2016 - 3 months follow-up, the patient states that he continues to feel significantly tired, he exercises every day but has to take break and then has to take a nap after exercise. He is to exercise every day but his hospital course and procedure prolonged course and he'll lost his conditioning. He denies any chest pain shortness of breath, no palpitation no lower extremity edema. He has history of wheezing on a daily basis. He continues to bruise easily but nosebleeds have resolved.  11/24/2016 - the patient is coming after 1 months, he feels overall much stronger, he exercises in and out of the pool every day, he continues to experience post exertional fatigue that is slightly improved from a month ago. He has also been experiencing problems with balance. He is asking if we can discontinue helical S as he has easy bruising but otherwise no bleeding.  Past Medical History:  Diagnosis Date  . Age-related macular degeneration   . Allergic rhinitis   . Aortic valve stenosis   . Arthritis    "hands, lower back" (04/30/2016)  . Asthma   . Carotid artery obstruction   . Chronic lower back pain   . Coronary arteriosclerosis in native artery   . Diverticulitis of colon   . DJD (degenerative joint disease)   . Dyspnea    "constant but the degree changes"  . Dysthymia   . GERD (gastroesophageal reflux disease)   . History of elevated PSA 05/2009  .  History of hiatal hernia   . HLD (hyperlipidemia)   . Hypertension   . Nocturnal hypoxemia   . OSA (obstructive sleep apnea)    intolerant to CPAP  . Paroxysmal supraventricular tachycardia ([place])   . Pneumonia    "when I was a kid"  . Presence of permanent cardiac pacemaker   . Primary malignant neoplasm of bladder ([place])   . Prostate cancer [facility])     Past Surgical History:  Procedure Laterality Date  . APPENDECTOMY    . CARDIAC  CATHETERIZATION N/A 01/17/2016   Procedure: Right/Left Heart Cath and Coronary Angiography;  Surgeon: Belva Crome, MD;  Location: [place] [facility];  Service: Cardiovascular;  Laterality: N/A;  . CARDIAC CATHETERIZATION N/A 04/30/2016   Procedure: Coronary/Graft Atherectomy;  Surgeon: Sherren Mocha, MD;  Location: [place] [facility];  Service: Cardiovascular;  Laterality: N/A;  . CAROTID ENDARTERECTOMY Bilateral   . CATARACT EXTRACTION W/ INTRAOCULAR LENS  IMPLANT, BILATERAL Bilateral   . COLON SURGERY  1981   Meckles Diverticulum with volvulus  . EP IMPLANTABLE DEVICE N/A 03/21/2016   Procedure: Pacemaker Implant;  Surgeon: Evans Lance, MD;  Location: [place] CV LAB;  Service: Cardiovascular;  Laterality: N/A;  . INGUINAL HERNIA REPAIR Right 2009  . INSERTION PROSTATE RADIATION SEED    . TEE WITHOUT CARDIOVERSION N/A 06/10/2016   Procedure: TRANSESOPHAGEAL ECHOCARDIOGRAM (TEE);  Surgeon: Sherren Mocha, MD;  Location: [place];  Service: Open Heart Surgery;  Laterality: N/A;  . TONSILLECTOMY  ~ 1947  . TRANSCATHETER AORTIC VALVE REPLACEMENT, TRANSFEMORAL N/A 06/10/2016   Procedure: TRANSCATHETER AORTIC VALVE REPLACEMENT, TRANSFEMORAL;  Surgeon: Sherren Mocha, MD;  Location: [place];  Service: Open Heart Surgery;  Laterality: N/A;   Current Medications: Outpatient Medications Prior to Visit  Medication Sig Dispense Refill  . acetaminophen (TYLENOL) 500 MG tablet Take 500 mg by mouth as needed for mild pain.     Marland Kitchen apixaban (ELIQUIS) 2.5 MG TABS tablet Take 1 tablet (2.5 mg total) by mouth 2 (two) times daily. 60 tablet 10  . cholecalciferol (VITAMIN D) 1000 units tablet Take 1,000 Units by mouth 2 (two) times a week.     . Coenzyme Q10 (COQ-10 PO) Take 1 capsule by mouth 2 (two) times a week.     . diltiazem (CARDIZEM CD) 120 MG 24 hr capsule Take 1 capsule (120 mg total) by mouth daily. 90 capsule 1  . latanoprost (XALATAN) 0.005 % ophthalmic solution Place 1 drop into both eyes at  bedtime.    . nitroGLYCERIN (NITROSTAT) 0.4 MG SL tablet Place 1 tablet (0.4 mg total) under the tongue every 5 (five) minutes as needed. 25 tablet 3  . atorvastatin (LIPITOR) 40 MG tablet TAKE 1 TABLET(40 MG) BY MOUTH DAILY 30 tablet 6   No facility-administered medications prior to visit.      Allergies:   Ciprofloxacin; Hydrochlorothiazide; Sulfa antibiotics; Ace inhibitors; Losartan; Carvedilol; and Lisinopril   Social History   Social History  . Marital status: Married    Spouse name: N/A  . Number of children: N/A  . Years of education: N/A   Occupational History  . retired    Social History Main Topics  . Smoking status: Never Smoker  . Smokeless tobacco: Never Used  . Alcohol use No     Comment: 04/30/2016 "nothing in years"  . Drug use: No  . Sexual activity: No   Other Topics Concern  . None   Social History Narrative  . None  Family History:  The patient's family history includes Heart disease in his father and mother.   ROS:   Please see the history of present illness.    ROS All other systems reviewed and are negative.   PHYSICAL EXAM:   VS:  BP (!) 142/64   Pulse 84   Ht 5' 4.75" (1.645 m)   Wt 134 lb (60.8 kg)   BMI 22.47 kg/m    GEN: Well nourished, well developed, in no acute distress  HEENT: normal  Neck: no JVD, carotid bruits, or masses Cardiac: RRR; 2/6 systolic murmurs, rubs, or gallops,no edema  Respiratory:  clear to auscultation bilaterally, normal work of breathing GI: soft, nontender, nondistended, + BS MS: no deformity or atrophy  Skin: warm and dry, no rash Neuro:  Alert and Oriented x 3, Strength and sensation are intact Psych: euthymic mood, full affect  Wt Readings from Last 3 Encounters:  11/24/16 134 lb (60.8 kg)  10/23/16 135 lb (61.2 kg)  07/31/16 135 lb 9.3 oz (61.5 kg)    Studies/Labs Reviewed:   EKG:  EKG is not ordered today.    Recent Labs: 03/18/2016: B Natriuretic Peptide 38.4; TSH 2.194 06/06/2016: ALT  32 06/11/2016: BUN 22; Creatinine, Ser 1.29; Magnesium 2.3; Potassium 4.0; Sodium 138 08/21/2016: Hemoglobin 12.6; Platelets 226   Lipid Panel    Component Value Date/Time   CHOL 147 03/20/2016 0422   TRIG 54 03/20/2016 0422   HDL 61 03/20/2016 0422   CHOLHDL 2.4 03/20/2016 0422   VLDL 11 03/20/2016 0422   LDLCALC 75 03/20/2016 0422    Additional studies/ records that were reviewed today include:   - Left ventricle: The cavity size was normal. Wall thickness was   increased in a pattern of mild LVH. Systolic function was normal.   The estimated ejection fraction was in the range of 50% to 55%.   Doppler parameters are consistent with abnormal left ventricular   relaxation (grade 1 diastolic dysfunction). - Aortic valve: Post TAVR with trivial periprosthetic   regurgitation. - Aorta: Post TAVR with stable gradients and only trivial   perivalvular regurgitation. - Mitral valve: There was mild regurgitation. - Left atrium: The atrium was moderately dilated. - Atrial septum: No defect or patent foramen ovale was identified.    ASSESSMENT:    1. S/P TAVR (transcatheter aortic valve replacement)   2. PAF (paroxysmal atrial fibrillation) ([place])   3. Coronary artery disease involving native coronary artery of native heart without angina pectoris   4. Cardiac pacemaker in situ   5. Fatigue, unspecified type   6. Tachycardia-bradycardia syndrome ([place])   7. Mixed hyperlipidemia      PLAN:  In order of problems listed above:  1. CAD - s/p PCI with DES to RCA.  He denies further angina since his PCI. Residual disease will be treated medically. We discontinued Plavix on 10/31/2016, continue Eliquis only.  2. Aortic stenosis -  status post TAVR on 06/11/2015, with great results normal transaortic gradients 8 mmHg in 07/2016 and trivial paravalvular leak only.   3. Paroxysmal AF - Maintaining normal sinus rhythm. He is now off amiodarone, however will continue Eliquis. His epistaxis has  resolved his hemoglobin is stable and he has bruising but it's not significant. He agrees that he can tolerate a longer period  Continue Cardizem 120 CD daily.  4. Chronic diastolic CHF - Volume appears stable. He is NYHA has improved from 2b ->2a.  5. HTN - controlled.   6. HL -  Continue statin. Decrease dose of Lipitor to 20 mg po daily as he is experiencing memory problems.  7. PPM - FU with EP as planned.   Medication Adjustments/Labs and Tests Ordered: Current medicines are reviewed at length with the patient today.  Concerns regarding medicines are outlined above.  Medication changes, Labs and Tests ordered today are listed in the Patient Instructions below. Patient Instructions  Medication Instructions:   DECREASE YOUR ATORVASTATIN (LIPITOR) TO 20 MG ONCE DAILY     Follow-Up:  4 MONTHS WITH DR Meda Coffee       If you need a refill on your cardiac medications before your next appointment, please call your pharmacy.      Signed, Ena Dawley, MD  11/24/2016 11:35 AM    [address], [place], [place]  [address] Phone: [phone]; Fax: [phone]

## 2016-11-26 ENCOUNTER — Encounter (HOSPITAL_COMMUNITY): Payer: Medicare Other

## 2016-11-28 ENCOUNTER — Encounter (HOSPITAL_COMMUNITY): Payer: Medicare Other

## 2016-12-01 ENCOUNTER — Encounter (HOSPITAL_COMMUNITY): Payer: Medicare Other

## 2016-12-03 ENCOUNTER — Encounter (HOSPITAL_COMMUNITY): Payer: Medicare Other

## 2016-12-05 ENCOUNTER — Encounter (HOSPITAL_COMMUNITY): Payer: Medicare Other

## 2016-12-05 ENCOUNTER — Encounter (HOSPITAL_COMMUNITY): Payer: Self-pay | Admitting: Nurse Practitioner

## 2016-12-05 ENCOUNTER — Emergency Department (HOSPITAL_COMMUNITY)
Admission: EM | Admit: 2016-12-05 | Discharge: 2016-12-05 | Disposition: A | Discharge status: Home / Self Care | Payer: Medicare Other | Attending: Emergency Medicine | Admitting: Emergency Medicine

## 2016-12-05 DIAGNOSIS — R55 Syncope and collapse: Secondary | ICD-10-CM | POA: Diagnosis not present

## 2016-12-05 DIAGNOSIS — Z45018 Encounter for adjustment and management of other part of cardiac pacemaker: Secondary | ICD-10-CM

## 2016-12-05 DIAGNOSIS — Z950 Presence of cardiac pacemaker: Secondary | ICD-10-CM | POA: Diagnosis not present

## 2016-12-05 DIAGNOSIS — J45909 Unspecified asthma, uncomplicated: Secondary | ICD-10-CM | POA: Diagnosis not present

## 2016-12-05 DIAGNOSIS — Z7901 Long term (current) use of anticoagulants: Secondary | ICD-10-CM | POA: Insufficient documentation

## 2016-12-05 DIAGNOSIS — I480 Paroxysmal atrial fibrillation: Secondary | ICD-10-CM | POA: Diagnosis not present

## 2016-12-05 DIAGNOSIS — R404 Transient alteration of awareness: Secondary | ICD-10-CM | POA: Diagnosis not present

## 2016-12-05 DIAGNOSIS — Z953 Presence of xenogenic heart valve: Secondary | ICD-10-CM

## 2016-12-05 DIAGNOSIS — I5032 Chronic diastolic (congestive) heart failure: Secondary | ICD-10-CM | POA: Diagnosis not present

## 2016-12-05 DIAGNOSIS — Z8551 Personal history of malignant neoplasm of bladder: Secondary | ICD-10-CM | POA: Insufficient documentation

## 2016-12-05 DIAGNOSIS — I110 Hypertensive heart disease with heart failure: Secondary | ICD-10-CM | POA: Diagnosis not present

## 2016-12-05 DIAGNOSIS — R42 Dizziness and giddiness: Secondary | ICD-10-CM | POA: Diagnosis not present

## 2016-12-05 DIAGNOSIS — R5383 Other fatigue: Secondary | ICD-10-CM | POA: Insufficient documentation

## 2016-12-05 DIAGNOSIS — I2510 Atherosclerotic heart disease of native coronary artery without angina pectoris: Secondary | ICD-10-CM | POA: Diagnosis not present

## 2016-12-05 DIAGNOSIS — Z8546 Personal history of malignant neoplasm of prostate: Secondary | ICD-10-CM | POA: Insufficient documentation

## 2016-12-05 DIAGNOSIS — Z79899 Other long term (current) drug therapy: Secondary | ICD-10-CM | POA: Diagnosis not present

## 2016-12-05 DIAGNOSIS — I471 Supraventricular tachycardia: Secondary | ICD-10-CM

## 2016-12-05 DIAGNOSIS — M6281 Muscle weakness (generalized): Secondary | ICD-10-CM | POA: Diagnosis present

## 2016-12-05 LAB — I-STAT TROPONIN, ED: Troponin i, poc: 0.02 ng/mL (ref 0.00–0.08)

## 2016-12-05 LAB — BASIC METABOLIC PANEL
Anion gap: 10 (ref 5–15)
BUN: 19 mg/dL (ref 6–20)
CO2: 20 mmol/L — ABNORMAL LOW (ref 22–32)
Calcium: 9 mg/dL (ref 8.9–10.3)
Chloride: 106 mmol/L (ref 101–111)
Creatinine, Ser: 1.11 mg/dL (ref 0.61–1.24)
GFR calc Af Amer: >60 mL/min (ref >60)
GFR calc non Af Amer: 59 mL/min — ABNORMAL LOW (ref >60)
Glucose, Bld: 152 mg/dL — ABNORMAL HIGH (ref 65–99)
Potassium: 4.4 mmol/L (ref 3.5–5.1)
Sodium: 136 mmol/L (ref 135–145)

## 2016-12-05 LAB — URINALYSIS, ROUTINE W REFLEX MICROSCOPIC
BILIRUBIN URINE: NEGATIVE
GLUCOSE, UA: NEGATIVE mg/dL
Hgb urine dipstick: NEGATIVE
KETONES UR: NEGATIVE mg/dL
Leukocytes, UA: NEGATIVE
Nitrite: NEGATIVE
PH: 7 (ref 5.0–8.0)
Protein, ur: NEGATIVE mg/dL
SPECIFIC GRAVITY, URINE: 1.008 (ref 1.005–1.030)

## 2016-12-05 LAB — CBC WITH DIFFERENTIAL/PLATELET
Basophils Absolute: 0 10*3/uL (ref 0.0–0.1)
Basophils Relative: 1 %
Eosinophils Absolute: 0.1 10*3/uL (ref 0.0–0.7)
Eosinophils Relative: 2 %
HCT: 38 % — ABNORMAL LOW (ref 39.0–52.0)
Hemoglobin: 11.8 g/dL — ABNORMAL LOW (ref 13.0–17.0)
Lymphocytes Relative: 27 %
Lymphs Abs: 1.3 10*3/uL (ref 0.7–4.0)
MCH: 24.9 pg — ABNORMAL LOW (ref 26.0–34.0)
MCHC: 31.1 g/dL (ref 30.0–36.0)
MCV: 80.3 fL (ref 78.0–100.0)
Monocytes Absolute: 0.5 10*3/uL (ref 0.1–1.0)
Monocytes Relative: 10 %
Neutro Abs: 2.9 10*3/uL (ref 1.7–7.7)
Neutrophils Relative %: 60 %
Platelets: 151 10*3/uL (ref 150–400)
RBC: 4.73 MIL/uL (ref 4.22–5.81)
RDW: 16.6 % — ABNORMAL HIGH (ref 11.5–15.5)
WBC: 4.9 10*3/uL (ref 4.0–10.5)

## 2016-12-05 MED ORDER — SODIUM CHLORIDE 0.9 % IV BOLUS (SEPSIS)
1000.0000 mL | Freq: Once | INTRAVENOUS | Status: AC
  Administered 2016-12-05: 1000 mL via INTRAVENOUS

## 2016-12-05 NOTE — ED Provider Notes (Signed)
Medical screening examination/treatment/procedure(s) were conducted as a shared visit with non-physician practitioner(s) and myself.  I personally evaluated the patient during the encounter.   EKG Interpretation  Date/Time:  Friday December 05 2016 10:34:04 EDT Ventricular Rate:  68 PR Interval:    QRS Duration: 135 QT Interval:  462 QTC Calculation: 492 R Axis:   59 Text Interpretation:  Sinus rhythm Ventricular premature complex Probable left atrial enlargement IVCD, consider atypical LBBB previous EKG shows paced rhythm no acute changes  Confirmed by Brantley Stage [phone]) on 12/05/2016 10:42:75 AM      83 year old male who presents with weakness and near syncope. Patient sitting on indoor patio and got up to go inside the house. Began to have severe generalized weakness, dizziness/lightheadedness, mild dyspnea, and mild chest discomfort. Was lowered to ground by wife, and he states he felt so unwell that he was unable to get up. Denies heat exposure/sun exposure, recent illness, syncope, focal numbness/weakness, n/v/d, recent GI bleeding, abd pain or severe back pain.   Patient with known history of CAD, diastolic HF, PAF, medtronic pacemaker, and severe AS s/p TAVR. Vital signs normal in the ED. He is non-toxic and in no distress. EKG not ischemic and unchanged from prior. Pacemaker is interrogated. He had several seconds of extreme tachycardia that could be afib or atrial tachycardia during episode today. Patient evaluated by Dr. Recardo Evangelist in the ED, who did not recommend treatment changes for this and felt patient could be discharged home as he likely had brief episode of orthostatic hypotension.  Strict return and follow-up instructions reviewed. He expressed understanding of all discharge instructions and felt comfortable with the plan of care.    Forde Dandy, MD 12/05/16 [phone]

## 2016-12-05 NOTE — ED Triage Notes (Signed)
Per EMS patient from come c/o acute onset of weakness, dizziness and feeling pre-syncopal. Patient called wife who assisted patient to floor and patient was unable to get up. EMS arrival patient was pale and clammy, CBG 126 EKG left BBB- with no known history. Patient has pacemaker and valve replacements. Patient had near syncope but no syncopal event.

## 2016-12-05 NOTE — Consult Note (Signed)
Cardiology Consultation:   Patient ID: Ryan [name]; [id number]; 1930-01-30   Admit date: 12/05/2016 Date of Consult: 12/05/2016  Primary Care Provider: Velna Hatchet, MD Primary Cardiologist: Zella Richer Primary Electrophysiologist:  Lovena Le   Patient Profile:   Ryan [name] is a 83 y.o. male with a hx of AS s/p TAVR January 2018, PAFib and PAT, Tachycardia-bradycardia syndrome s/p dual ch PPM (Medtronic Adapta October 2017), CAD s/p RCA stent November 2017, history of carotid stenosis status post endarterectomy, HTN, chronic diastolic HF  who is being seen today for the evaluation of near syncope at the request of Dr. Oleta Mouse.  History of Present Illness:   Ryan [name] was feeling well earlier today. He went out on the porch and took a phone call for several minutes. It was very hot outside. He stood up to get back in the house and felt very weak and dizzy. He had to sit down on the ground and was unable to stand back up. She did not lose full consciousness. He continued to feel poorly for at least 10-15 minutes before EMS was called and was still feeling weak and unwell when he arrived at the emergency room. These events began around 9:30 AM.  After being monitored in the emergency room he felt much better. He is currently asymptomatic. He did not complain of shortness of breath or chest pain at any point.  He denies recent problems with fever, cough, chills, hemoptysis, leg edema, calf tenderness, abdominal pain, change in bowel pattern or overt bleeding, focal neurological complaints, full blown syncope or new focal neurological events. He did describe racing palpitationm when he was feeling weak, but he continued to have symptoms of a racing heartbeat when he was seen in the emergency room and was in normal sinus rhythm with a rate of 80/min.  Interrogation of his pacemaker does not show any arrhythmia there around the time of his symptoms. He did have a brief burst of what is probably  paroxysmal atrial tachycardia around 9:15 AM, but it lasted for under 3 seconds. His pacemaker shows occasional episodes of paroxysmal atrial tachycardia and paroxysmal atrial fibrillation that have occurred randomly over the last month, the longest being only 29 minutes in duration. During atrial fibrillation ventricular rate response is not well controlled, with rates around 120-125 bpm.  He has a chronic left bundle branch block. Cardiac enzymes and electrolytes were normal today.  Past Medical History:  Diagnosis Date  . Age-related macular degeneration   . Allergic rhinitis   . Aortic valve stenosis   . Arthritis    "hands, lower back" (04/30/2016)  . Asthma   . Carotid artery obstruction   . Chronic lower back pain   . Coronary arteriosclerosis in native artery   . Diverticulitis of colon   . DJD (degenerative joint disease)   . Dyspnea    "constant but the degree changes"  . Dysthymia   . GERD (gastroesophageal reflux disease)   . History of elevated PSA 05/2009  . History of hiatal hernia   . HLD (hyperlipidemia)   . Hypertension   . Nocturnal hypoxemia   . OSA (obstructive sleep apnea)    intolerant to CPAP  . Paroxysmal supraventricular tachycardia ([place])   . Pneumonia    "when I was a kid"  . Presence of permanent cardiac pacemaker   . Primary malignant neoplasm of bladder ([place])   . Prostate cancer [facility])     Past Surgical History:  Procedure Laterality Date  .  APPENDECTOMY    . CARDIAC CATHETERIZATION N/A 01/17/2016   Procedure: Right/Left Heart Cath and Coronary Angiography;  Surgeon: Belva Crome, MD;  Location: [place] CV LAB;  Service: Cardiovascular;  Laterality: N/A;  . CARDIAC CATHETERIZATION N/A 04/30/2016   Procedure: Coronary/Graft Atherectomy;  Surgeon: Sherren Mocha, MD;  Location: [place] CV LAB;  Service: Cardiovascular;  Laterality: N/A;  . CAROTID ENDARTERECTOMY Bilateral   . CATARACT EXTRACTION W/ INTRAOCULAR LENS  IMPLANT, BILATERAL  Bilateral   . COLON SURGERY  1981   Meckles Diverticulum with volvulus  . EP IMPLANTABLE DEVICE N/A 03/21/2016   Procedure: Pacemaker Implant;  Surgeon: Evans Lance, MD;  Location: [place] CV LAB;  Service: Cardiovascular;  Laterality: N/A;  . INGUINAL HERNIA REPAIR Right 2009  . INSERTION PROSTATE RADIATION SEED    . TEE WITHOUT CARDIOVERSION N/A 06/10/2016   Procedure: TRANSESOPHAGEAL ECHOCARDIOGRAM (TEE);  Surgeon: Sherren Mocha, MD;  Location: [place];  Service: Open Heart Surgery;  Laterality: N/A;  . TONSILLECTOMY  ~ 1947  . TRANSCATHETER AORTIC VALVE REPLACEMENT, TRANSFEMORAL N/A 06/10/2016   Procedure: TRANSCATHETER AORTIC VALVE REPLACEMENT, TRANSFEMORAL;  Surgeon: Sherren Mocha, MD;  Location: [place];  Service: Open Heart Surgery;  Laterality: N/A;     Inpatient Medications: Scheduled Meds:  Continuous Infusions:  PRN Meds:   Allergies:    Allergies  Allergen Reactions  . Ciprofloxacin Anaphylaxis  . Hydrochlorothiazide Anaphylaxis  . Sulfa Antibiotics Anaphylaxis  . Ace Inhibitors Cough  . Losartan Cough  . Carvedilol Cough    Pt states it "causes him too cough."  . Lisinopril Cough    Pt reports worsening cough and "feeling wheezy."    Social History:   Social History   Social History  . Marital status: Married    Spouse name: N/A  . Number of children: N/A  . Years of education: N/A   Occupational History  . retired    Social History Main Topics  . Smoking status: Never Smoker  . Smokeless tobacco: Never Used  . Alcohol use No     Comment: 04/30/2016 "nothing in years"  . Drug use: No  . Sexual activity: No   Other Topics Concern  . Not on file   Social History Narrative  . No narrative on file    Family History:   The patient's family history includes Heart disease in his father and mother.  ROS:  Please see the history of present illness.  ROS  All other ROS reviewed and negative.     Physical Exam/Data:   Vitals:   12/05/16  1228 12/05/16 1300 12/05/16 1330 12/05/16 1400  BP: (!) 163/75 (!) 171/71 (!) 172/80 (!) 166/75  Pulse: 65 62 70 67  Resp: 14 13 14 16   Temp:      TempSrc:      SpO2: 95% 98% 97% 96%   No intake or output data in the 24 hours ending 12/05/16 1448 There were no vitals filed for this visit. There is no height or weight on file to calculate BMI.  General:  Well nourished, well developed, in no acute distress HEENT: normal Lymph: no adenopathy Neck: no JVD Endocrine:  No thryomegaly Vascular: No carotid bruits; FA pulses 2+ bilaterally without bruits  Cardiac:  normal S1, Paradoxically split S2; RRR; noDiastolic murmur , 1/6 aortic ejection murmur Lungs:  clear to auscultation bilaterally, no wheezing, rhonchi or rales  Abd: soft, nontender, no hepatomegaly  Ext: no edema Musculoskeletal:  No deformities, BUE and BLE strength  normal and equal Skin: warm and dry  Neuro:  CNs 2-12 intact, no focal abnormalities noted Psych:  Normal affect   EKG:  The EKG was personally reviewed and demonstrates:  Sinus rhythm with a single PVC, atypical left bundle branch block Telemetry:  Telemetry was personally reviewed and demonstrates:  Sinus rhythm  Relevant CV Studies: Pacemaker interrogation is reviewed. Device function is normal. Estimated generator longevity 13 years, normal lead parameters. 90% atrial pacing, only 0.5% ventricular pacing. Overall burden of mode switch less than 0.1% with the longest episode of atrial fibrillation lasting for about 29 minutes on June 3.  Laboratory Data:  Chemistry Recent Labs Lab 12/05/16 1036  NA 136  K 4.4  CL 106  CO2 20*  GLUCOSE 152*  BUN 19  CREATININE 1.11  CALCIUM 9.0  GFRNONAA 59*  GFRAA >60  ANIONGAP 10    No results for input(s): PROT, ALBUMIN, AST, ALT, ALKPHOS, BILITOT in the last 168 hours. Hematology Recent Labs Lab 12/05/16 1036  WBC 4.9  RBC 4.73  HGB 11.8*  HCT 38.0*  MCV 80.3  MCH 24.9*  MCHC 31.1  RDW 16.6*  PLT  151   Cardiac EnzymesNo results for input(s): TROPONINI in the last 168 hours.  Recent Labs Lab 12/05/16 1048  TROPIPOC 0.02    BNPNo results for input(s): BNP, PROBNP in the last 168 hours.  DDimer No results for input(s): DDIMER in the last 168 hours.  Radiology/Studies:  No results found.  Assessment and Plan:   1. Near syncope: Clinical description signs most compatible with a brief episode of hypotension, likely related to change in position and extremely warm weather. Encouraged him to increase intake of water. He is not on diuretics. Avoid abrupt changes in position. 2. PAFib: Episodes are brief and were not temporally associated with the symptoms. They appear to be asymptomatic even when prolonged. He does have rapid ventricular rates and may benefit from a slight increase in the dose of diltiazem. Did not make any changes today since the suspicion is that he was transiently hypotensive. He is appropriately anticoagulated 3. PAT: A very brief episode of ectopic atrial tachycardia, possible atrial flutter with 2-1 AV conduction is seen today, but preceded his symptoms by at least 15 minutes and lasted for less than 3 seconds. Unlikely to have any relationship with this complaint. 4. AS s/p TAVR: Normal device function by echo February 2018. Trivial perivalvular regurgitation described, not audible on physical exam 5. PPM: Normal device function. No adjustments made today.   Signed, Sanda Klein, MD  12/05/2016 2:48 PM

## 2016-12-05 NOTE — ED Provider Notes (Signed)
[facility] DEPT Provider Note   CSN: [id number] Arrival date & time: 12/05/16  1030     History   Chief Complaint Chief Complaint  Patient presents with  . Weakness    HPI Ryan [name] is a 83 y.o. male.  HPI   83 year old male presents today with complaints of generalized weakness.  Patient has a significant past medical history of carotid artery disease status post CEA, CAD, aortic stenosis, hypertension, hyperlipidemia, prior SVT, obstructive sleep apnea, diastolic congestive heart failure, proximal atrial fibrillation status post pacemaker. Patient reports he was in his baseline state of health this morning when he went out onto his back deck.  He was sitting in a chair in the back deck, stood up to go back inside and felt extreme fatigue and dizziness. He notes that he sat on the ground and was unable to get up due to generalized weakness.  He denied any significant chest pain or shortness of breath, reports he was having dizziness and felt as if his heart was racing.  Patient denied any preceding fever, productive cough, shortness of breath, nausea vomiting or diarrhea.      Past Medical History:  Diagnosis Date  . Age-related macular degeneration   . Allergic rhinitis   . Aortic valve stenosis   . Arthritis    "hands, lower back" (04/30/2016)  . Asthma   . Carotid artery obstruction   . Chronic lower back pain   . Coronary arteriosclerosis in native artery   . Diverticulitis of colon   . DJD (degenerative joint disease)   . Dyspnea    "constant but the degree changes"  . Dysthymia   . GERD (gastroesophageal reflux disease)   . History of elevated PSA 05/2009  . History of hiatal hernia   . HLD (hyperlipidemia)   . Hypertension   . Nocturnal hypoxemia   . OSA (obstructive sleep apnea)    intolerant to CPAP  . Paroxysmal supraventricular tachycardia ([place])   . Pneumonia    "when I was a kid"  . Presence of permanent cardiac pacemaker   . Primary  malignant neoplasm of bladder ([place])   . Prostate cancer [facility])     Patient Active Problem List   Diagnosis Date Noted  . Anticoagulation management encounter 08/19/2016  . CAD (coronary artery disease), native coronary artery 04/30/2016  . Tachy-brady syndrome ([place])   . Hypertensive heart disease with heart failure ([place])   . Pure hypercholesterolemia   . PAF (paroxysmal atrial fibrillation) ([place]) 03/18/2016  . Chronic diastolic CHF (congestive heart failure) ([place]) 03/18/2016  . Atrial fibrillation with RVR ([place]) 03/18/2016  . Atrial fibrillation with rapid ventricular response ([place]) 03/18/2016  . Fatigue 01/17/2016  . DOE (dyspnea on exertion) 01/17/2016  . Coronary artery disease of native artery of native heart with stable angina pectoris ([place])   . Severe aortic stenosis 10/12/2015    Past Surgical History:  Procedure Laterality Date  . APPENDECTOMY    . CARDIAC CATHETERIZATION N/A 01/17/2016   Procedure: Right/Left Heart Cath and Coronary Angiography;  Surgeon: Belva Crome, MD;  Location: [facility];  Service: Cardiovascular;  Laterality: N/A;  . CARDIAC CATHETERIZATION N/A 04/30/2016   Procedure: Coronary/Graft Atherectomy;  Surgeon: Sherren Mocha, MD;  Location: [facility];  Service: Cardiovascular;  Laterality: N/A;  . CAROTID ENDARTERECTOMY Bilateral   . CATARACT EXTRACTION W/ INTRAOCULAR LENS  IMPLANT, BILATERAL Bilateral   . COLON SURGERY  1981   Meckles Diverticulum with volvulus  .  EP IMPLANTABLE DEVICE N/A 03/21/2016   Procedure: Pacemaker Implant;  Surgeon: Evans Lance, MD;  Location: [facility];  Service: Cardiovascular;  Laterality: N/A;  . INGUINAL HERNIA REPAIR Right 2009  . INSERTION PROSTATE RADIATION SEED    . TEE WITHOUT CARDIOVERSION N/A 06/10/2016   Procedure: TRANSESOPHAGEAL ECHOCARDIOGRAM (TEE);  Surgeon: Sherren Mocha, MD;  Location: [facility];  Service: Open Heart Surgery;  Laterality: N/A;  . TONSILLECTOMY  ~ 1947  .  TRANSCATHETER AORTIC VALVE REPLACEMENT, TRANSFEMORAL N/A 06/10/2016   Procedure: TRANSCATHETER AORTIC VALVE REPLACEMENT, TRANSFEMORAL;  Surgeon: Sherren Mocha, MD;  Location: [facility];  Service: Open Heart Surgery;  Laterality: N/A;       Home Medications    Prior to Admission medications   Medication Sig Start Date End Date Taking? Authorizing Provider  acetaminophen (TYLENOL) 500 MG tablet Take 500 mg by mouth as needed for mild pain.    Yes [provider]  apixaban (ELIQUIS) 2.5 MG TABS tablet Take 1 tablet (2.5 mg total) by mouth 2 (two) times daily. 03/22/16  Yes Lyda Jester M, PA-C  atorvastatin (LIPITOR) 20 MG tablet Take 1 tablet (20 mg total) by mouth daily. 11/24/16 02/22/17 Yes Dorothy Spark, MD  B Complex Vitamins (VITAMIN B COMPLEX PO) Take 1 capsule by mouth 2 (two) times a week.   Yes [provider]  cholecalciferol (VITAMIN D) 1000 units tablet Take 1,000 Units by mouth 2 (two) times a week.    Yes [provider]  Coenzyme Q10 (COQ-10 PO) Take 1 capsule by mouth 2 (two) times a week.    Yes [provider]  diltiazem (CARDIZEM CD) 120 MG 24 hr capsule Take 1 capsule (120 mg total) by mouth daily. 10/23/16 01/21/17 Yes Dorothy Spark, MD  latanoprost (XALATAN) 0.005 % ophthalmic solution Place 1 drop into both eyes at bedtime.   Yes [provider]  nitroGLYCERIN (NITROSTAT) 0.4 MG SL tablet Place 1 tablet (0.4 mg total) under the tongue every 5 (five) minutes as needed. 05/01/16  Yes Cheryln Manly, NP    Family History Family History  Problem Relation Age of Onset  . Heart disease Mother   . Heart disease Father     Social History Social History  Substance Use Topics  . Smoking status: Never Smoker  . Smokeless tobacco: Never Used  . Alcohol use No     Comment: 04/30/2016 "nothing in years"     Allergies   Ciprofloxacin; Hydrochlorothiazide; Sulfa antibiotics; Ace inhibitors; Losartan; Carvedilol; and  Lisinopril   Review of Systems Review of Systems  All other systems reviewed and are negative.    Physical Exam Updated Vital Signs BP (!) 144/66 (BP Location: Right Arm)   Pulse 71   Temp 98.1 F (36.7 C) (Oral)   Resp 16   SpO2 95%   Physical Exam  Constitutional: He is oriented to person, place, and time. He appears well-developed and well-nourished.  HENT:  Head: Normocephalic and atraumatic.  Eyes: Conjunctivae are normal. Pupils are equal, round, and reactive to light. Right eye exhibits no discharge. Left eye exhibits no discharge. No scleral icterus.  Neck: Normal range of motion. No JVD present. No tracheal deviation present.  Cardiovascular: Normal rate, regular rhythm and normal heart sounds.  Exam reveals no gallop and no friction rub.   No murmur heard. Pulmonary/Chest: Effort normal and breath sounds normal. No stridor. No respiratory distress. He has no wheezes. He has no rales. He exhibits no tenderness.  Abdominal: Soft. He exhibits no distension and no mass. There is no tenderness. There is no rebound and no guarding. No hernia.  Neurological: He is alert and oriented to person, place, and time. He has normal strength. No cranial nerve deficit or sensory deficit. Coordination normal. GCS eye subscore is 4. GCS verbal subscore is 5. GCS motor subscore is 6.  Psychiatric: He has a normal mood and affect. His behavior is normal. Judgment and thought content normal.  Nursing note and vitals reviewed.    ED Treatments / Results  Labs (all labs ordered are listed, but only abnormal results are displayed) Labs Reviewed  CBC WITH DIFFERENTIAL/PLATELET - Abnormal; Notable for the following:       Result Value   Hemoglobin 11.8 (*)    HCT 38.0 (*)    MCH 24.9 (*)    RDW 16.6 (*)    All other components within normal limits  BASIC METABOLIC PANEL - Abnormal; Notable for the following:    CO2 20 (*)    Glucose, Bld 152 (*)    GFR calc non Af Amer 59 (*)    All  other components within normal limits  URINALYSIS, ROUTINE W REFLEX MICROSCOPIC - Abnormal; Notable for the following:    Color, Urine STRAW (*)    All other components within normal limits  I-STAT TROPOININ, ED    EKG  EKG Interpretation  Date/Time:  Friday December 05 2016 10:34:04 EDT Ventricular Rate:  68 PR Interval:    QRS Duration: 135 QT Interval:  462 QTC Calculation: 492 R Axis:   59 Text Interpretation:  Sinus rhythm Ventricular premature complex Probable left atrial enlargement IVCD, consider atypical LBBB previous EKG shows paced rhythm no acute changes  Confirmed by Brantley Stage [phone]) on 12/05/2016 10:42:06 AM       Radiology No results found.  Procedures Procedures (including critical care time)  Medications Ordered in ED Medications  sodium chloride 0.9 % bolus 1,000 mL (0 mLs Intravenous Stopped 12/05/16 1233)     Initial Impression / Assessment and Plan / ED Course  I have reviewed the triage vital signs and the nursing notes.  Pertinent labs & imaging results that were available during my care of the patient were reviewed by me and considered in my medical decision making (see chart for details).      Final Clinical Impressions(s) / ED Diagnoses   Final diagnoses:  Fatigue, unspecified type   Labs: CBC, BMP, Troponin, urinalysis  Imaging:  Consults:  Therapeutics: NS  Discharge Meds:   Assessment/Plan:   83 year old male presents today with an episode of acute weakness.  Patient does have significant cardiac past medical history.  I have low suspicion that this was a true cardiac event.  Patient was very fatigued upon my initial evaluation, after a liter of saline he was dramatically improved.  Patient denied any chest pain throughout this event, denies any abdominal pain or any neurological deficits.  Patient's pacemaker was interrogated which showed a brief episode of atrial tachycardia, a spoke with cardiology who evaluated the patient who agreed  that this was unlikely cardiac event and felt that this was stable for outpatient follow-up.  Patient happy with today's plan, he has no acute findings that would necessitate further evaluation or management here in the ED.  Patient will be discharged home with family and strict return precautions.  He verbalized understanding and agreement to today's plan had no further questions or concerns at time of discharge.  New Prescriptions Discharge Medication List as of 12/05/2016  2:49 PM       Okey Regal, PA-C 12/05/16 1633    Forde Dandy, MD 12/06/16 [phone]

## 2016-12-05 NOTE — Discharge Instructions (Signed)
Please read attached information. If you experience any new or worsening signs or symptoms please return to the emergency room for evaluation. Please follow-up with your primary care provider or specialist as discussed. Please use previously prescribed medication only as directed and discontinue taking if you have any concerning signs or symptoms.   °

## 2016-12-05 NOTE — ED Notes (Signed)
Per [employer] around 951 am 7 beats of ventricular high rate. No other issues noted.

## 2016-12-08 ENCOUNTER — Encounter (HOSPITAL_COMMUNITY): Payer: Medicare Other

## 2016-12-10 DIAGNOSIS — H401213 Low-tension glaucoma, right eye, severe stage: Secondary | ICD-10-CM | POA: Diagnosis not present

## 2016-12-10 DIAGNOSIS — H47233 Glaucomatous optic atrophy, bilateral: Secondary | ICD-10-CM | POA: Diagnosis not present

## 2016-12-10 DIAGNOSIS — H401222 Low-tension glaucoma, left eye, moderate stage: Secondary | ICD-10-CM | POA: Diagnosis not present

## 2016-12-10 DIAGNOSIS — H2513 Age-related nuclear cataract, bilateral: Secondary | ICD-10-CM | POA: Diagnosis not present

## 2016-12-15 ENCOUNTER — Telehealth: Payer: Self-pay | Admitting: Cardiology

## 2016-12-15 MED ORDER — DILTIAZEM HCL ER COATED BEADS 180 MG PO CP24: 180.0000 mg | ORAL_CAPSULE | Freq: Every day | ORAL | 3 refills | Status: DC

## 2016-12-15 NOTE — Telephone Encounter (Signed)
Received patient's transmission which showed 78 atrial high rate episodes - EGMs show afib. 7 ventricular high rates - EGMs show afib with RVR. Most recent afib episode 7/16 beginning around 11:15am and lasting approximately 60min.

## 2016-12-15 NOTE — Telephone Encounter (Signed)
Will forward to Dr Meda Coffee for follow-up request with Dr Loletha Carrow, on this pt.

## 2016-12-15 NOTE — Telephone Encounter (Signed)
Walked patient through how to send a remote transmission successfully. Awaiting for transmission to be received in carelink.

## 2016-12-15 NOTE — Telephone Encounter (Signed)
I have let Dr Loletha Carrow know already. However let Mr Curto know that he had an episode of a-fib with RVR this am, for now we will increase cardizem CD to 180 mg po daily. Please instruct him to stay hydrated and monitor BP for hypotension Please add him to my schedule in 2 weeks. Have him call us if he has more episodes, we might need to restart amiodarone (+ metoprolol ) again. Thank you, Ena Dawley, MD

## 2016-12-15 NOTE — Telephone Encounter (Signed)
New message    Dr. Loletha Carrow cell phone # [phone]  wants Dr. Meda Coffee to call him regarding his father -in - law  From Vivien Rota understanding some issues going on with him. No other details was given  Only wants to speak with Dr.Nelson.   Aware that Dr. Meda Coffee in [place] today

## 2016-12-15 NOTE — Telephone Encounter (Signed)
Notified the pt of transmission results and plan per Dr Meda Coffee.  Informed the pt that Dr Meda Coffee, she wants to increase his Cardizem CD to 180 mg po daily, and he should stay plenty hydrated and monitor his BP for hypotension.  Informed the pt that per Dr Meda Coffee, she wants to see him back in around 2 weeks, for follow-up of increase in med change, and possibly reintroducing amiodarone and metoprolol to his regimen.  Informed the pt that he should call our office back between now and his next follow-up appt, if he has more episodes.  Scheduled the pt to see Dr Meda Coffee on 01/05/17 at 1000.  Confirmed the pharmacy of choice with the pt. Pt verbalized understanding and agrees with this plan.

## 2016-12-15 NOTE — Telephone Encounter (Signed)
Please have device clinic call him and remotely interrogate his pacemaker - he had symptomatic tachycardiac earlier today lasting 30-60 minutes. Please let me know what it shows. Thank you, [place] Siren

## 2016-12-23 DIAGNOSIS — H401133 Primary open-angle glaucoma, bilateral, severe stage: Secondary | ICD-10-CM | POA: Diagnosis not present

## 2016-12-30 ENCOUNTER — Ambulatory Visit (INDEPENDENT_AMBULATORY_CARE_PROVIDER_SITE_OTHER): Payer: Medicare Other | Admitting: *Deleted

## 2016-12-30 DIAGNOSIS — I495 Sick sinus syndrome: Secondary | ICD-10-CM | POA: Diagnosis not present

## 2016-12-30 NOTE — Progress Notes (Signed)
Remote pacemaker transmission.   

## 2017-01-01 ENCOUNTER — Encounter: Payer: Self-pay | Admitting: Cardiology

## 2017-01-01 LAB — CUP PACEART REMOTE DEVICE CHECK
Battery Impedance: 113 Ohm
Battery Voltage: 2.79 V
Brady Statistic AS VS Percent: 12 %
Implantable Lead Implant Date: 20171020
Implantable Lead Location: 753859
Implantable Lead Model: 5076
Lead Channel Impedance Value: 399 Ohm
Lead Channel Pacing Threshold Pulse Width: 0.4 ms
Lead Channel Setting Pacing Amplitude: 1.5 V
Lead Channel Setting Pacing Amplitude: 2 V
Lead Channel Setting Pacing Pulse Width: 0.4 ms
Lead Channel Setting Sensing Sensitivity: 4 mV
MDC IDC LEAD IMPLANT DT: 20171020
MDC IDC LEAD LOCATION: 753860
MDC IDC MSMT BATTERY REMAINING LONGEVITY: 154 mo
MDC IDC MSMT LEADCHNL RA PACING THRESHOLD AMPLITUDE: 0.375 V
MDC IDC MSMT LEADCHNL RA PACING THRESHOLD PULSEWIDTH: 0.4 ms
MDC IDC MSMT LEADCHNL RV IMPEDANCE VALUE: 622 Ohm
MDC IDC MSMT LEADCHNL RV PACING THRESHOLD AMPLITUDE: 0.625 V
MDC IDC MSMT LEADCHNL RV SENSING INTR AMPL: 8 mV
MDC IDC PG IMPLANT DT: 20171020
MDC IDC SESS DTM: 20180731104508
MDC IDC STAT BRADY AP VP PERCENT: 0 %
MDC IDC STAT BRADY AP VS PERCENT: 88 %
MDC IDC STAT BRADY AS VP PERCENT: 0 %

## 2017-01-04 NOTE — Progress Notes (Signed)
Cardiology Office Note    Date:  01/05/2017   ID:  Ryan [name], DOB [birth year], MRN [id number]  PCP:  Ryan Hatchet, MD  Cardiologist:   Ryan Dawley, MD   Chief complain: follow up, a-fib  History of Present Illness:  Ryan [name] is a 83 y.o. male with a hx of carotid artery disease s/p bilateral CEA, CAD, aortic stenosis, HTN, HL, prior SVT, OSA, diastolic CHF, paroxysmal atrial fibrillation.  Echo in 5/17 demonstrated mod aortic stenosis with mean gradient 25 mmHg. In 8/17, he developed worsening dyspnea with exertion and fatigue. Cardiac catheterization demonstrated worsening CAD with 70% mid LCx, 80% OM1 and 80% mid RCA stenosis. PCI would be high risk. Medical therapy was recommended initially. He then saw Ryan. Roxy [name] in surgical consultation. He was felt to have stage D severe symptomatic aortic stenosis. Work up for AVR vs TAVR was initiated.  He was then admitted in 10/17 with unstable angina in the setting of AF with RVR. This was complicated by tachybradycardia syndrome and he underwent pacemaker implantation with Ryan. Lovena [name].   05/22/2016 - He returns for Cardiology follow up. In 11/17, he underwent successful rotational atherectomy and stenting of the RCA with a 3 x 12 mm Synergy DES on 04/30/16 by Ryan [name]. He was discharged on aspirin, Plavix and apixaban with plans to discontinue aspirin after 30 days. Apixaban to be stopped 5 days prior to TAVR. TAVR is scheduled for 06/11/2015. He states that he continues to have dyspnea on minimal exertion, dizziness with exertion. No syncope. He continues to have daily epistaxis.  07/23/2016 - 6 weeks post TAVR, he is procedure was uncomplicated, his postprocedural echo shows LVEF 50-55% with no central AI and only trivial paravalvular leak. The patient has been doing well, he started to walk about 20-30 minutes a day, he is going to start cardiac rehabilitation on March 1. He denies any palpitations, no dizziness, no syncope, no  orthopnea, no lower extremity edema. He's complaining is profound fatigue, lack of energy and easy bruising.  10/23/2016 - 3 months follow-up, the patient states that he continues to feel significantly tired, he exercises every day but has to take break and then has to take a nap after exercise. He is to exercise every day but his hospital course and procedure prolonged course and he'll lost his conditioning. He denies any chest pain shortness of breath, no palpitation no lower extremity edema. He has history of wheezing on a daily basis. He continues to bruise easily but nosebleeds have resolved.  11/24/2016 - the patient is coming after 1 months, he feels overall much stronger, he exercises in and out of the pool every day, he continues to experience post exertional fatigue that is slightly improved from a month ago. He has also been experiencing problems with balance. He is asking if we can discontinue helical S as he has easy bruising but otherwise no bleeding.  01/05/17 - 6 week follow up, we were contacted by Ryan Ryan [name], his son in law in July that his father had an episode of presyncope, he was seen by Ryan Ryan [name] on 12/05/16, it was concluded that it was sec to orthostatic hypotension.  We interrogated his PM and found that he had an episode of a-fib with RVR, we increased cardizem CD to 180 mg po daily. He was instructed to stay hydrated and monitor BP for hypotension. He was explained that if he has more episodes, we might need to restart amiodarone (+ metoprolol )  again. The interrogation also showed A\a very brief episode of ectopic atrial tachycardia, possible atrial flutter with 2-1 AV conduction, but preceded his symptoms by at least 15 minutes and lasted for less than 3 seconds. Unlikely to have any relationship with this complaint. The patient also states that he has been profoundly tired with minimal activity and has chest pressure on exertion. This feels like before he got his stent placed. He  also has episodes of dizziness every day. We have gotten interrogation of his pacer today in the clinic he has had 9 episodes of rapid atrial fibrillation in the last months but the last episode was on July 31. He brings a diary and he had more episodes of dizziness and weakness on August 2nd, 5th, 6th. No syncope.   Past Medical History:  Diagnosis Date  . Age-related macular degeneration   . Allergic rhinitis   . Aortic valve stenosis   . Arthritis    "hands, lower back" (04/30/2016)  . Asthma   . Carotid artery obstruction   . Chronic lower back pain   . Coronary arteriosclerosis in native artery   . Diverticulitis of colon   . DJD (degenerative joint disease)   . Dyspnea    "constant but the degree changes"  . Dysthymia   . GERD (gastroesophageal reflux disease)   . History of elevated PSA 05/2009  . History of hiatal hernia   . HLD (hyperlipidemia)   . Hypertension   . Nocturnal hypoxemia   . OSA (obstructive sleep apnea)    intolerant to CPAP  . Paroxysmal supraventricular tachycardia ([place])   . Pneumonia    "when I was a kid"  . Presence of permanent cardiac pacemaker   . Primary malignant neoplasm of bladder ([place])   . Prostate cancer [facility])     Past Surgical History:  Procedure Laterality Date  . APPENDECTOMY    . CARDIAC CATHETERIZATION N/A 01/17/2016   Procedure: Right/Left Heart Cath and Coronary Angiography;  Surgeon: Ryan Crome, MD;  Location: [place] [facility];  Service: Cardiovascular;  Laterality: N/A;  . CARDIAC CATHETERIZATION N/A 04/30/2016   Procedure: Coronary/Graft Atherectomy;  Surgeon: Ryan Mocha, MD;  Location: [place] [facility];  Service: Cardiovascular;  Laterality: N/A;  . CAROTID ENDARTERECTOMY Bilateral   . CATARACT EXTRACTION W/ INTRAOCULAR LENS  IMPLANT, BILATERAL Bilateral   . COLON SURGERY  1981   Meckles Diverticulum with volvulus  . EP IMPLANTABLE DEVICE N/A 03/21/2016   Procedure: Pacemaker Implant;  Surgeon: Ryan Lance,  MD;  Location: [place] [facility];  Service: Cardiovascular;  Laterality: N/A;  . INGUINAL HERNIA REPAIR Right 2009  . INSERTION PROSTATE RADIATION SEED    . TEE WITHOUT CARDIOVERSION N/A 06/10/2016   Procedure: TRANSESOPHAGEAL ECHOCARDIOGRAM (TEE);  Surgeon: Ryan Mocha, MD;  Location: [place];  Service: Open Heart Surgery;  Laterality: N/A;  . TONSILLECTOMY  ~ 1947  . TRANSCATHETER AORTIC VALVE REPLACEMENT, TRANSFEMORAL N/A 06/10/2016   Procedure: TRANSCATHETER AORTIC VALVE REPLACEMENT, TRANSFEMORAL;  Surgeon: Ryan Mocha, MD;  Location: [place];  Service: Open Heart Surgery;  Laterality: N/A;   Current Medications: Outpatient Medications Prior to Visit  Medication Sig Dispense Refill  . acetaminophen (TYLENOL) 500 MG tablet Take 500 mg by mouth as needed for mild pain.     Marland Kitchen apixaban (ELIQUIS) 2.5 MG TABS tablet Take 1 tablet (2.5 mg total) by mouth 2 (two) times daily. 60 tablet 10  . atorvastatin (LIPITOR) 20 MG tablet Take 1 tablet (20 mg  total) by mouth daily. 90 tablet 3  . B Complex Vitamins (VITAMIN B COMPLEX PO) Take 1 capsule by mouth 2 (two) times a week.    . cholecalciferol (VITAMIN D) 1000 units tablet Take 1,000 Units by mouth 2 (two) times a week.     . Coenzyme Q10 (COQ-10 PO) Take 1 capsule by mouth 2 (two) times a week.     . latanoprost (XALATAN) 0.005 % ophthalmic solution Place 1 drop into both eyes at bedtime.    . nitroGLYCERIN (NITROSTAT) 0.4 MG SL tablet Place 1 tablet (0.4 mg total) under the tongue every 5 (five) minutes as needed. 25 tablet 3  . diltiazem (CARDIZEM CD) 180 MG 24 hr capsule Take 1 capsule (180 mg total) by mouth daily. 30 capsule 3   No facility-administered medications prior to visit.      Allergies:   Ciprofloxacin; Hydrochlorothiazide; Sulfa antibiotics; Ace inhibitors; Losartan; Carvedilol; and Lisinopril   Social History   Social History  . Marital status: Married    Spouse name: N/A  . Number of children: N/A  . Years of education:  N/A   Occupational History  . retired    Social History Main Topics  . Smoking status: Never Smoker  . Smokeless tobacco: Never Used  . Alcohol use No     Comment: 04/30/2016 "nothing in years"  . Drug use: No  . Sexual activity: No   Other Topics Concern  . None   Social History Narrative  . None     Family History:  The patient's family history includes Heart disease in his father and mother.   ROS:   Please see the history of present illness.    ROS All other systems reviewed and are negative.   PHYSICAL EXAM:   VS:  BP 136/74   Pulse 73   Ht 5\' 4"  (1.626 m)   Wt 138 lb (62.6 kg)   SpO2 97%   BMI 23.69 kg/m    GEN: Well nourished, well developed, in no acute distress  HEENT: normal  Neck: no JVD, carotid bruits, or masses Cardiac: RRR; 2/6 systolic murmurs, rubs, or gallops,no edema  Respiratory:  clear to auscultation bilaterally, normal work of breathing GI: soft, nontender, nondistended, + BS MS: no deformity or atrophy  Skin: warm and dry, no rash Neuro:  Alert and Oriented x 3, Strength and sensation are intact Psych: euthymic mood, full affect  Wt Readings from Last 3 Encounters:  01/05/17 138 lb (62.6 kg)  11/24/16 134 lb (60.8 kg)  10/23/16 135 lb (61.2 kg)    Studies/Labs Reviewed:   EKG:  Was personally reviewed and shows HL paced rhythm, left bundle branch block,  Recent Labs: 03/18/2016: B Natriuretic Peptide 38.4; TSH 2.194 06/06/2016: ALT 32 06/11/2016: Magnesium 2.3 12/05/2016: BUN 19; Creatinine, Ser 1.11; Hemoglobin 11.8; Platelets 151; Potassium 4.4; Sodium 136   Lipid Panel    Component Value Date/Time   CHOL 147 03/20/2016 0422   TRIG 54 03/20/2016 0422   HDL 61 03/20/2016 0422   CHOLHDL 2.4 03/20/2016 0422   VLDL 11 03/20/2016 0422   LDLCALC 75 03/20/2016 0422    Additional studies/ records that were reviewed today include:   - Left ventricle: The cavity size was normal. Wall thickness was   increased in a pattern of mild  LVH. Systolic function was normal.   The estimated ejection fraction was in the range of 50% to 55%.   Doppler parameters are consistent with abnormal left ventricular  relaxation (grade 1 diastolic dysfunction). - Aortic valve: Post TAVR with trivial periprosthetic   regurgitation. - Aorta: Post TAVR with stable gradients and only trivial   perivalvular regurgitation. - Mitral valve: There was mild regurgitation. - Left atrium: The atrium was moderately dilated. - Atrial septum: No defect or patent foramen ovale was identified.    ASSESSMENT:    1. Coronary artery disease involving native coronary artery of native heart without angina pectoris   2. SOB (shortness of breath)      PLAN:  In order of problems listed above:  1. DOE, exertional fatigue, dizziness - suspicious for CAD, he underwent successful rotational atherectomy and stenting of the RCA with a 3 x 12 mm Synergy DES on 04/30/16 by Ryan [name]. Residual disease will be treated medically (Mid Cx to Dist Cx lesion, 70% stenosed. The lesion is type C. The lesion is calcified). He now feels like prior to stenting. We will obtain a pharmacologic nuclear stress test to evaluate for ischemia.  2. Paroxysmal atrial fibrillation with rapid ventricular response - previously sought this will be behind his presyncopal episode and weakness some of the episodes are related to presyncope however exertional dyspnea is beyond these and pacemaker interrogation done today in the clinic doesn't correlate at all of his symptoms. However I will discontinue diltiazem and start loading with amiodarone 400 mg by mouth twice a day 5 days, followed by amiodarone 200 mg twice a day 5 days followed by amiodarone 200 mg daily chronically. Also add metoprolol 12.5 mg by mouth twice a day. Continue Eliquis.  3. Aortic stenosis -  status post TAVR on 06/11/2015, with great results normal transaortic gradients 8 mmHg in 07/2016 and trivial paravalvular leak  only.   4. Chronic diastolic CHF - Volume appears stable. He is NYHA has improved from 2b ->2a.  5. HTN - controlled.   6. HL - Continue statin. Decrease dose of Lipitor to 20 mg po daily as he is experiencing memory problems.  7. PPM - FU with EP as planned. Interrogated today as above.  Medication Adjustments/Labs and Tests Ordered: Current medicines are reviewed at length with the patient today.  Concerns regarding medicines are outlined above.  Medication changes, Labs and Tests ordered today are listed in the Patient Instructions below. Patient Instructions  Medication Instructions:  Your physician has recommended you make the following change in your medication:  1) STOP Cardizem 2) START Metoprolol 12.5mg  tablet by mouth TWICE daily 3) START Amiodarone 400 mg (two tablets) by mouth TWICE daily for 5 days; then take 200 mg (one tablet) by mouth TWICE daily for 5 days; then take 200 mg tablet by mouth ONCE daily    Labwork: none  Testing/Procedures: Your physician has requested that you have a lexiscan myoview. For further information please visit [email or web address]. Please follow instruction sheet, as given.   Follow-Up: Your physician recommends that you schedule a follow-up appointment in: 3 weeks in A.  Fib Clinic - August 28th at 11am;   Keep scheduled follow-up with Ryan. Meda Coffee in September   Any Other Special Instructions Will Be Listed Below (If Applicable).     If you need a refill on your cardiac medications before your next appointment, please call your pharmacy.      Signed, Ryan Dawley, MD  01/05/2017 11:21 AM    [address], [place],   [address] Phone: [phone]; Fax: [phone]

## 2017-01-05 ENCOUNTER — Telehealth (HOSPITAL_COMMUNITY): Payer: Self-pay | Admitting: *Deleted

## 2017-01-05 ENCOUNTER — Ambulatory Visit (INDEPENDENT_AMBULATORY_CARE_PROVIDER_SITE_OTHER): Payer: Medicare Other | Admitting: Cardiology

## 2017-01-05 ENCOUNTER — Ambulatory Visit (INDEPENDENT_AMBULATORY_CARE_PROVIDER_SITE_OTHER): Payer: Medicare Other | Admitting: *Deleted

## 2017-01-05 ENCOUNTER — Encounter: Payer: Self-pay | Admitting: Cardiology

## 2017-01-05 VITALS — BP 136/74 | HR 73 | Ht 64.0 in | Wt 138.0 lb

## 2017-01-05 DIAGNOSIS — I480 Paroxysmal atrial fibrillation: Secondary | ICD-10-CM

## 2017-01-05 DIAGNOSIS — R0602 Shortness of breath: Secondary | ICD-10-CM | POA: Diagnosis not present

## 2017-01-05 DIAGNOSIS — I2510 Atherosclerotic heart disease of native coronary artery without angina pectoris: Secondary | ICD-10-CM

## 2017-01-05 DIAGNOSIS — Z952 Presence of prosthetic heart valve: Secondary | ICD-10-CM

## 2017-01-05 DIAGNOSIS — R5383 Other fatigue: Secondary | ICD-10-CM | POA: Diagnosis not present

## 2017-01-05 DIAGNOSIS — I495 Sick sinus syndrome: Secondary | ICD-10-CM

## 2017-01-05 DIAGNOSIS — Z950 Presence of cardiac pacemaker: Secondary | ICD-10-CM

## 2017-01-05 DIAGNOSIS — Z5181 Encounter for therapeutic drug level monitoring: Secondary | ICD-10-CM

## 2017-01-05 DIAGNOSIS — Z7901 Long term (current) use of anticoagulants: Secondary | ICD-10-CM

## 2017-01-05 DIAGNOSIS — I5032 Chronic diastolic (congestive) heart failure: Secondary | ICD-10-CM

## 2017-01-05 LAB — CUP PACEART INCLINIC DEVICE CHECK
Battery Remaining Longevity: 157 mo
Battery Voltage: 2.79 V
Brady Statistic AP VP Percent: 0 %
Implantable Lead Implant Date: 20171020
Implantable Pulse Generator Implant Date: 20171020
Lead Channel Impedance Value: 460 Ohm
Lead Channel Impedance Value: 625 Ohm
Lead Channel Setting Sensing Sensitivity: 4 mV
MDC IDC LEAD IMPLANT DT: 20171020
MDC IDC LEAD LOCATION: 753859
MDC IDC LEAD LOCATION: 753860
MDC IDC MSMT BATTERY IMPEDANCE: 113 Ohm
MDC IDC SESS DTM: 20180806112419
MDC IDC SET LEADCHNL RA PACING AMPLITUDE: 2 V
MDC IDC SET LEADCHNL RV PACING AMPLITUDE: 2.5 V
MDC IDC SET LEADCHNL RV PACING PULSEWIDTH: 0.4 ms
MDC IDC STAT BRADY AP VS PERCENT: 88 %
MDC IDC STAT BRADY AS VP PERCENT: 0 %
MDC IDC STAT BRADY AS VS PERCENT: 12 %

## 2017-01-05 MED ORDER — AMIODARONE HCL 200 MG PO TABS: ORAL_TABLET | ORAL | 3 refills | Status: DC

## 2017-01-05 MED ORDER — METOPROLOL TARTRATE 25 MG PO TABS: 12.5000 mg | ORAL_TABLET | Freq: Two times a day (BID) | ORAL | 3 refills | Status: DC

## 2017-01-05 NOTE — Progress Notes (Signed)
Pacemaker check in clinic for episodes, added-on per Dr. Meda Coffee. No testing performed, impedance and threshold trends stable. Patient reports presyncopal episodes on 8/2, 8/5, and 8/6. 0.6% AT/AF burden, +Eliquis, most recent on 8/3, duration 41min 9sec. 9 VHR episodes--appear AF w/RVR and ?NSVT per markers, most recent on 7/31, duration 29sec (AF w/RVR). RA min output increased to 2.0V, RV min output increased to 2.5V per clinic protocol. Patient education completed. ROV with AF clinic on 8/28 and ROV with GT on 03/27/17.

## 2017-01-05 NOTE — Telephone Encounter (Signed)
Patient given detailed instructions per Myocardial Perfusion Study Information Sheet for the test on 01/06/17 at 7:30. Patient notified to arrive 15 minutes early and that it is imperative to arrive on time for appointment to keep from having the test rescheduled.  If you need to cancel or reschedule your appointment, please call the office within 24 hours of your appointment. . Patient verbalized understanding.Ryan [name]

## 2017-01-05 NOTE — Patient Instructions (Signed)
Medication Instructions:  Your physician has recommended you make the following change in your medication:  1) STOP Cardizem 2) START Metoprolol 12.5mg  tablet by mouth TWICE daily 3) START Amiodarone 400 mg (two tablets) by mouth TWICE daily for 5 days; then take 200 mg (one tablet) by mouth TWICE daily for 5 days; then take 200 mg tablet by mouth ONCE daily    Labwork: none  Testing/Procedures: Your physician has requested that you have a lexiscan myoview. For further information please visit [email or web address]. Please follow instruction sheet, as given.   Follow-Up: Your physician recommends that you schedule a follow-up appointment in: 3 weeks in A.  Fib Clinic - August 28th at 11am;   Keep scheduled follow-up with Dr. Meda Coffee in September   Any Other Special Instructions Will Be Listed Below (If Applicable).     If you need a refill on your cardiac medications before your next appointment, please call your pharmacy.

## 2017-01-06 ENCOUNTER — Encounter (HOSPITAL_COMMUNITY): Payer: Self-pay

## 2017-01-06 ENCOUNTER — Ambulatory Visit (HOSPITAL_COMMUNITY): Payer: Medicare Other | Attending: Cardiology

## 2017-01-06 DIAGNOSIS — R0602 Shortness of breath: Secondary | ICD-10-CM | POA: Diagnosis not present

## 2017-01-06 DIAGNOSIS — R0609 Other forms of dyspnea: Secondary | ICD-10-CM | POA: Diagnosis present

## 2017-01-06 DIAGNOSIS — R5383 Other fatigue: Secondary | ICD-10-CM | POA: Insufficient documentation

## 2017-01-06 DIAGNOSIS — R079 Chest pain, unspecified: Secondary | ICD-10-CM | POA: Insufficient documentation

## 2017-01-06 DIAGNOSIS — R42 Dizziness and giddiness: Secondary | ICD-10-CM | POA: Insufficient documentation

## 2017-01-06 DIAGNOSIS — R9439 Abnormal result of other cardiovascular function study: Secondary | ICD-10-CM | POA: Diagnosis not present

## 2017-01-06 DIAGNOSIS — I509 Heart failure, unspecified: Secondary | ICD-10-CM | POA: Insufficient documentation

## 2017-01-06 DIAGNOSIS — I2510 Atherosclerotic heart disease of native coronary artery without angina pectoris: Secondary | ICD-10-CM

## 2017-01-06 DIAGNOSIS — J45909 Unspecified asthma, uncomplicated: Secondary | ICD-10-CM | POA: Insufficient documentation

## 2017-01-06 DIAGNOSIS — I110 Hypertensive heart disease with heart failure: Secondary | ICD-10-CM | POA: Insufficient documentation

## 2017-01-06 DIAGNOSIS — I447 Left bundle-branch block, unspecified: Secondary | ICD-10-CM | POA: Diagnosis not present

## 2017-01-06 MED ORDER — TECHNETIUM TC 99M TETROFOSMIN IV KIT
10.8000 | PACK | Freq: Once | INTRAVENOUS | Status: AC | PRN
Start: 2017-01-06 — End: 2017-01-06
  Administered 2017-01-06: 10.8 via INTRAVENOUS
  Filled 2017-01-06: qty 11

## 2017-01-06 MED ORDER — TECHNETIUM TC 99M TETROFOSMIN IV KIT
32.4000 | PACK | Freq: Once | INTRAVENOUS | Status: AC | PRN
  Administered 2017-01-06: 32.4 via INTRAVENOUS
  Filled 2017-01-06: qty 33

## 2017-01-06 MED ORDER — REGADENOSON 0.4 MG/5ML IV SOLN
0.4000 mg | Freq: Once | INTRAVENOUS | Status: AC
  Administered 2017-01-06: 0.4 mg via INTRAVENOUS

## 2017-01-07 LAB — MYOCARDIAL PERFUSION IMAGING
LV dias vol: 117 mL (ref 62–150)
LV sys vol: 61 mL
Peak HR: 73 {beats}/min
RATE: 0.34
Rest HR: 73 {beats}/min
SDS: 2
SRS: 4
SSS: 6
TID: 0.93

## 2017-01-09 ENCOUNTER — Telehealth: Payer: Self-pay | Admitting: Cardiology

## 2017-01-09 NOTE — Telephone Encounter (Signed)
Patient called back, informed that per Dr. Meda Coffee there is no ischemia noted from [facility], and no need for a cardiac catheterization. Confirmed his amiodarone taper per last OV note. Pt appreciative for the information provided.

## 2017-01-09 NOTE — Telephone Encounter (Signed)
F/u message ° °Pt returning RN call .please call back to discuss  °

## 2017-01-15 DIAGNOSIS — L858 Other specified epidermal thickening: Secondary | ICD-10-CM | POA: Diagnosis not present

## 2017-01-15 DIAGNOSIS — D045 Carcinoma in situ of skin of trunk: Secondary | ICD-10-CM | POA: Diagnosis not present

## 2017-01-15 DIAGNOSIS — Z85828 Personal history of other malignant neoplasm of skin: Secondary | ICD-10-CM | POA: Diagnosis not present

## 2017-01-15 DIAGNOSIS — L570 Actinic keratosis: Secondary | ICD-10-CM | POA: Diagnosis not present

## 2017-01-15 DIAGNOSIS — D1801 Hemangioma of skin and subcutaneous tissue: Secondary | ICD-10-CM | POA: Diagnosis not present

## 2017-01-22 ENCOUNTER — Telehealth: Payer: Self-pay | Admitting: *Deleted

## 2017-01-22 NOTE — Telephone Encounter (Signed)
Called the pt and informed him that his handicapp assistance form was filled out by Dr Meda Coffee and is available for him to come and pick-up at the front desk at his earliest convenience.  Pt verbalized understanding and more than gracious for all the assistance provided.

## 2017-01-27 ENCOUNTER — Encounter (HOSPITAL_COMMUNITY): Payer: Self-pay | Admitting: Nurse Practitioner

## 2017-01-27 ENCOUNTER — Ambulatory Visit (HOSPITAL_COMMUNITY)
Admission: RE | Admit: 2017-01-27 | Discharge: 2017-01-27 | Disposition: A | Discharge status: Home / Self Care | Payer: Medicare Other | Source: Ambulatory Visit | Attending: Nurse Practitioner | Admitting: Nurse Practitioner

## 2017-01-27 VITALS — BP 132/84 | HR 80 | Ht 64.0 in | Wt 137.8 lb

## 2017-01-27 DIAGNOSIS — I2510 Atherosclerotic heart disease of native coronary artery without angina pectoris: Secondary | ICD-10-CM | POA: Diagnosis not present

## 2017-01-27 DIAGNOSIS — Z7901 Long term (current) use of anticoagulants: Secondary | ICD-10-CM | POA: Diagnosis not present

## 2017-01-27 DIAGNOSIS — Z952 Presence of prosthetic heart valve: Secondary | ICD-10-CM | POA: Diagnosis not present

## 2017-01-27 DIAGNOSIS — I480 Paroxysmal atrial fibrillation: Secondary | ICD-10-CM

## 2017-01-27 NOTE — Progress Notes (Signed)
Primary Care Physician: Velna Hatchet, MD Referring Physician: Dr. Janna Arch is a 83 y.o. male with a h/o CAD, PPM, PVD, TAVR CHF,that started having spells of weakness, presyncope and was found to have afib with RVR. He was started on amiodarone load with Dr. Meda Coffee 8/6 and is here for f/u. He currently is taking 200 mg daily. He reports that he still has spells of weakness that have been present for months. He thought his most recent stent and TAVR repair would have improved his symptoms but they persist. His device was interrogated today he has not had any tachy episodes since 8/10 and that episode was just 46 secs long. He continues on apixaban 2.5 mg with a chadsvasc score of at least 5.  Today, he denies symptoms of palpitations, chest pain, shortness of breath, orthopnea, PND, lower extremity edema, dizziness, presyncope, syncope, or neurologic sequela.+ for weak spells. The patient is tolerating medications without difficulties and is otherwise without complaint today.   Past Medical History:  Diagnosis Date  . Age-related macular degeneration   . Allergic rhinitis   . Aortic valve stenosis   . Arthritis    "hands, lower back" (04/30/2016)  . Asthma   . Carotid artery obstruction   . Chronic lower back pain   . Coronary arteriosclerosis in native artery   . Diverticulitis of colon   . DJD (degenerative joint disease)   . Dyspnea    "constant but the degree changes"  . Dysthymia   . GERD (gastroesophageal reflux disease)   . History of elevated PSA 05/2009  . History of hiatal hernia   . HLD (hyperlipidemia)   . Hypertension   . Nocturnal hypoxemia   . OSA (obstructive sleep apnea)    intolerant to CPAP  . Paroxysmal supraventricular tachycardia ([place])   . Pneumonia    "when I was a kid"  . Presence of permanent cardiac pacemaker   . Primary malignant neoplasm of bladder ([place])   . Prostate cancer [facility])    Past Surgical History:  Procedure Laterality  Date  . APPENDECTOMY    . CARDIAC CATHETERIZATION N/A 01/17/2016   Procedure: Right/Left Heart Cath and Coronary Angiography;  Surgeon: Belva Crome, MD;  Location: [place] [facility];  Service: Cardiovascular;  Laterality: N/A;  . CARDIAC CATHETERIZATION N/A 04/30/2016   Procedure: Coronary/Graft Atherectomy;  Surgeon: Sherren Mocha, MD;  Location: [place] [facility];  Service: Cardiovascular;  Laterality: N/A;  . CAROTID ENDARTERECTOMY Bilateral   . CATARACT EXTRACTION W/ INTRAOCULAR LENS  IMPLANT, BILATERAL Bilateral   . COLON SURGERY  1981   Meckles Diverticulum with volvulus  . EP IMPLANTABLE DEVICE N/A 03/21/2016   Procedure: Pacemaker Implant;  Surgeon: Evans Lance, MD;  Location: [place] [facility];  Service: Cardiovascular;  Laterality: N/A;  . INGUINAL HERNIA REPAIR Right 2009  . INSERTION PROSTATE RADIATION SEED    . TEE WITHOUT CARDIOVERSION N/A 06/10/2016   Procedure: TRANSESOPHAGEAL ECHOCARDIOGRAM (TEE);  Surgeon: Sherren Mocha, MD;  Location: [name];  Service: Open Heart Surgery;  Laterality: N/A;  . TONSILLECTOMY  ~ 1947  . TRANSCATHETER AORTIC VALVE REPLACEMENT, TRANSFEMORAL N/A 06/10/2016   Procedure: TRANSCATHETER AORTIC VALVE REPLACEMENT, TRANSFEMORAL;  Surgeon: Sherren Mocha, MD;  Location: [place];  Service: Open Heart Surgery;  Laterality: N/A;    Current Outpatient Prescriptions  Medication Sig Dispense Refill  . acetaminophen (TYLENOL) 500 MG tablet Take 500 mg by mouth as needed for mild pain.     Marland Kitchen  amiodarone (PACERONE) 200 MG tablet Take 200 mg by mouth daily.    Marland Kitchen apixaban (ELIQUIS) 2.5 MG TABS tablet Take 1 tablet (2.5 mg total) by mouth 2 (two) times daily. 60 tablet 10  . atorvastatin (LIPITOR) 20 MG tablet Take 1 tablet (20 mg total) by mouth daily. 90 tablet 3  . cholecalciferol (VITAMIN D) 1000 units tablet Take 1,000 Units by mouth 2 (two) times a week.     . Coenzyme Q10 (COQ-10 PO) Take 1 capsule by mouth 2 (two) times a week.     . Cyanocobalamin  (VITAMIN B 12 PO) Take 1 capsule by mouth daily.    Marland Kitchen latanoprost (XALATAN) 0.005 % ophthalmic solution Place 1 drop into both eyes at bedtime.    . metoprolol tartrate (LOPRESSOR) 25 MG tablet Take 0.5 tablets (12.5 mg total) by mouth 2 (two) times daily. 180 tablet 3  . nitroGLYCERIN (NITROSTAT) 0.4 MG SL tablet Place 1 tablet (0.4 mg total) under the tongue every 5 (five) minutes as needed. 25 tablet 3   No current facility-administered medications for this encounter.     Allergies  Allergen Reactions  . Ciprofloxacin Anaphylaxis  . Hydrochlorothiazide Anaphylaxis  . Sulfa Antibiotics Anaphylaxis  . Ace Inhibitors Cough  . Losartan Cough  . Carvedilol Cough    Pt states it "causes him too cough."  . Lisinopril Cough    Pt reports worsening cough and "feeling wheezy."    Social History   Social History  . Marital status: Married    Spouse name: N/A  . Number of children: N/A  . Years of education: N/A   Occupational History  . retired    Social History Main Topics  . Smoking status: Never Smoker  . Smokeless tobacco: Never Used  . Alcohol use No     Comment: 04/30/2016 "nothing in years"  . Drug use: No  . Sexual activity: No   Other Topics Concern  . Not on file   Social History Narrative  . No narrative on file    Family History  Problem Relation Age of Onset  . Heart disease Mother   . Heart disease Father     ROS- All systems are reviewed and negative except as per the HPI above  Physical Exam: Vitals:   01/27/17 1103  BP: 132/84  Pulse: 80  Weight: 137 lb 12.8 oz (62.5 kg)  Height: 5\' 4"  (1.626 m)   Wt Readings from Last 3 Encounters:  01/27/17 137 lb 12.8 oz (62.5 kg)  01/06/17 138 lb (62.6 kg)  01/05/17 138 lb (62.6 kg)    Labs: Lab Results  Component Value Date   NA 136 12/05/2016   K 4.4 12/05/2016   CL 106 12/05/2016   CO2 20 (L) 12/05/2016   GLUCOSE 152 (H) 12/05/2016   BUN 19 12/05/2016   CREATININE 1.11 12/05/2016   CALCIUM  9.0 12/05/2016   MG 2.3 06/11/2016   Lab Results  Component Value Date   INR 1.26 06/10/2016   Lab Results  Component Value Date   CHOL 147 03/20/2016   HDL 61 03/20/2016   [place] 75 03/20/2016   TRIG 54 03/20/2016     GEN- The patient is well appearing, alert and oriented x 3 today.   Head- normocephalic, atraumatic Eyes-  Sclera clear, conjunctiva pink Ears- hearing intact Oropharynx- clear Neck- supple, no JVP Lymph- no cervical lymphadenopathy Lungs- Clear to ausculation bilaterally, normal work of breathing Heart- Regular rate and rhythm, no murmurs, rubs or  gallops, PMI not laterally displaced GI- soft, NT, ND, + BS Extremities- no clubbing, cyanosis, or edema MS- no significant deformity or atrophy Skin- no rash or lesion Psych- euthymic mood, full affect Neuro- strength and sensation are intact  EKG-av paced rhythm at 80 bpm, LBBB, pr int 252 ms, qrs int 136 ms, qtc 512 ms Echo- Study Conclusions  - Left ventricle: The cavity size was normal. Systolic function was   normal. The estimated ejection fraction was in the range of 50%   to 55%. Wall motion was normal; there were no regional wall   motion abnormalities. Doppler parameters are consistent with   abnormal left ventricular relaxation (grade 1 diastolic   dysfunction). - Aortic valve: TAVR bioprosthetic valve is functioning normally.   There was trivial regurgitation. - Mitral valve: There was trivial regurgitation. - Tricuspid valve: There was moderate regurgitation. - Pulmonary arteries: Systolic pressure was mildly increased. PA   peak pressure: 44 mm Hg (S).  Nuclear study-8/7-Nuclear stress EF: 48%.  No T wave inversion was noted during stress.  There was no ST segment deviation noted during stress.  Defect 1: There is a medium defect of moderate severity.  This is an intermediate risk study.   Medium size, moderate severity mostly fixed (SDS 2) apical and apical septal perfusion defect,  likely representing LBBB abnormality. No significant reversible ischemia. LVEF 48% with incoordinate septal motion. This is an intermediate risk study (due to LVEF <49%).   Assessment and Plan: 1. Paroxysmal afib Has  finished amiodarone load and is now taking 200 mg a day Tolerating  well Device interrogated and only 45 sec of a RVR on 8/10, since then no tachy activity Continue apixaban 2.5 mg bid   2. CAD Per Dr. Meda Coffee No c/o chest pain  3. TAVR S/p repair  4. Intermittent generalized weakness spells ? Etiology No further high rate episodes since amiodarone to explain spells Defer to Dr. Meda Coffee  F/u with Dr. Meda Coffee 10/25 F/u with Dr. Lovena Le 10/26  Ryan [name], [facility] [address] [place], [place] [address] [phone]

## 2017-02-05 DIAGNOSIS — H18003 Unspecified corneal deposit, bilateral: Secondary | ICD-10-CM | POA: Diagnosis not present

## 2017-02-05 DIAGNOSIS — H401213 Low-tension glaucoma, right eye, severe stage: Secondary | ICD-10-CM | POA: Diagnosis not present

## 2017-02-05 DIAGNOSIS — H401222 Low-tension glaucoma, left eye, moderate stage: Secondary | ICD-10-CM | POA: Diagnosis not present

## 2017-02-05 DIAGNOSIS — H2513 Age-related nuclear cataract, bilateral: Secondary | ICD-10-CM | POA: Diagnosis not present

## 2017-03-02 DIAGNOSIS — H2511 Age-related nuclear cataract, right eye: Secondary | ICD-10-CM | POA: Diagnosis not present

## 2017-03-03 ENCOUNTER — Encounter: Payer: Self-pay | Admitting: Cardiology

## 2017-03-03 ENCOUNTER — Telehealth: Payer: Self-pay | Admitting: *Deleted

## 2017-03-03 NOTE — Telephone Encounter (Signed)
   [facility] Pre-operative Risk Assessment    Request for surgical clearance:  1. What type of surgery is being performed? Cataract Surgery   2. When is this surgery scheduled? 03/09/2017 & 03/23/2017   3. Are there any medications that need to be held prior to surgery and how long?   4. Name of physician performing surgery? Dr. Bing Plume   5. What is your office phone and fax number?  [place]   [phone]  Fax: [phone]   6. Anesthesia type (None, local, MAC, general) ? None  Per clearance request form:   Patients who have a positive history of cardiac problems are required to have had an EKG within the past 12 months.   Patients with a pacemaker/defibrillator must have a current pacer report dated within the last 3 months.   Patients who are having cataract surgery will not need to stop any of their blood thinners.   Patients who have a history of diabetes must be stable at the time of surgery.     Ryan [name] L 03/03/2017, 8:51 AM  _________________________________________________________________   (provider comments below)

## 2017-03-03 NOTE — Telephone Encounter (Signed)
Pre op clearance requested by Dr Bing Plume for cataract surgery. The pt had a TAVR in Jan 2018. He has PAF and is on Eliquis. He had an office visit with Roderic Palau NP in the AF clinic 01/27/17. Dr Lovena Le did a pacemaker check 01/29/17. Cataract surgery is considered low risk, and Eliquis would not need to be held. The pt does not need further cardiac testing and is cleared from a cardiac standpoint. Clearance will be faxed to [facility].  Kerin Ransom PA-C 03/03/2017 2:52 PM

## 2017-03-03 NOTE — Telephone Encounter (Signed)
Surgical clearance has been faxed to [phone]

## 2017-03-09 DIAGNOSIS — H25811 Combined forms of age-related cataract, right eye: Secondary | ICD-10-CM | POA: Diagnosis not present

## 2017-03-09 DIAGNOSIS — H2511 Age-related nuclear cataract, right eye: Secondary | ICD-10-CM | POA: Diagnosis not present

## 2017-03-11 ENCOUNTER — Other Ambulatory Visit: Payer: Self-pay | Admitting: Cardiology

## 2017-03-11 NOTE — Telephone Encounter (Signed)
Pt last saw Dr Meda Coffee 01/05/17, last labs 12/05/16 Creat 1.11, weight 62.5kg, age 83, based on specified criteria pt is not on appropriate dosage of Eliquis at 2.5mg  BID.  Discussed with Fuller Canada, PharmD pt's weight has been fluctuating slightly hovering round 60kg, therefore ok to leave on 2.5mg  BID at present and re-evaluate weight in 6 months. Will refill rx.

## 2017-03-18 DIAGNOSIS — H2512 Age-related nuclear cataract, left eye: Secondary | ICD-10-CM | POA: Diagnosis not present

## 2017-03-23 DIAGNOSIS — H2512 Age-related nuclear cataract, left eye: Secondary | ICD-10-CM | POA: Diagnosis not present

## 2017-03-23 DIAGNOSIS — H25812 Combined forms of age-related cataract, left eye: Secondary | ICD-10-CM | POA: Diagnosis not present

## 2017-03-26 ENCOUNTER — Encounter: Payer: Self-pay | Admitting: Cardiology

## 2017-03-26 ENCOUNTER — Encounter (INDEPENDENT_AMBULATORY_CARE_PROVIDER_SITE_OTHER): Payer: Self-pay

## 2017-03-26 ENCOUNTER — Ambulatory Visit (INDEPENDENT_AMBULATORY_CARE_PROVIDER_SITE_OTHER): Payer: Medicare Other | Admitting: Cardiology

## 2017-03-26 VITALS — BP 142/80 | HR 80 | Ht 64.0 in | Wt 141.0 lb

## 2017-03-26 DIAGNOSIS — Z952 Presence of prosthetic heart valve: Secondary | ICD-10-CM | POA: Diagnosis not present

## 2017-03-26 DIAGNOSIS — Z23 Encounter for immunization: Secondary | ICD-10-CM | POA: Diagnosis not present

## 2017-03-26 DIAGNOSIS — I2510 Atherosclerotic heart disease of native coronary artery without angina pectoris: Secondary | ICD-10-CM | POA: Diagnosis not present

## 2017-03-26 DIAGNOSIS — Z7901 Long term (current) use of anticoagulants: Secondary | ICD-10-CM

## 2017-03-26 DIAGNOSIS — I495 Sick sinus syndrome: Secondary | ICD-10-CM | POA: Diagnosis not present

## 2017-03-26 DIAGNOSIS — E782 Mixed hyperlipidemia: Secondary | ICD-10-CM

## 2017-03-26 DIAGNOSIS — I480 Paroxysmal atrial fibrillation: Secondary | ICD-10-CM

## 2017-03-26 DIAGNOSIS — Z950 Presence of cardiac pacemaker: Secondary | ICD-10-CM

## 2017-03-26 DIAGNOSIS — I5032 Chronic diastolic (congestive) heart failure: Secondary | ICD-10-CM

## 2017-03-26 MED ORDER — AMIODARONE HCL 200 MG PO TABS: 200.0000 mg | ORAL_TABLET | Freq: Every day | ORAL | 2 refills | Status: DC

## 2017-03-26 MED ORDER — METOPROLOL SUCCINATE ER 25 MG PO TB24: 25.0000 mg | ORAL_TABLET | Freq: Every day | ORAL | 2 refills | Status: DC

## 2017-03-26 NOTE — Progress Notes (Signed)
Cardiology Office Note    Date:  03/26/2017   ID:  CONNER MUEGGE, DOB [birth year], MRN [id number]  PCP:  Velna Hatchet, MD  Cardiologist:   Ena Dawley, MD   Chief complain: 2 months follow-up  History of Present Illness:  Ryan [name] is a 83 y.o. male with a hx of carotid artery disease s/p bilateral CEA, CAD, aortic stenosis, HTN, HL, prior SVT, OSA, diastolic CHF, paroxysmal atrial fibrillation.  Echo in 5/17 demonstrated mod aortic stenosis with mean gradient 25 mmHg. In 8/17, he developed worsening dyspnea with exertion and fatigue. Cardiac catheterization demonstrated worsening CAD with 70% mid LCx, 80% OM1 and 80% mid RCA stenosis. PCI would be high risk. Medical therapy was recommended initially. He then saw Dr. Roxy Manns in surgical consultation. He was felt to have stage D severe symptomatic aortic stenosis. Work up for AVR vs TAVR was initiated.  He was then admitted in 10/17 with unstable angina in the setting of AF with RVR. This was complicated by tachybradycardia syndrome and he underwent pacemaker implantation with Dr. Lovena Le.   05/22/2016 - He returns for Cardiology follow up. In 11/17, he underwent successful rotational atherectomy and stenting of the RCA with a 3 x 12 mm Synergy DES on 04/30/16 by Dr Burt Knack. He was discharged on aspirin, Plavix and apixaban with plans to discontinue aspirin after 30 days. Apixaban to be stopped 5 days prior to TAVR. TAVR is scheduled for 06/11/2015. He states that he continues to have dyspnea on minimal exertion, dizziness with exertion. No syncope. He continues to have daily epistaxis.  07/23/2016 - 6 weeks post TAVR, he is procedure was uncomplicated, his postprocedural echo shows LVEF 50-55% with no central AI and only trivial paravalvular leak. The patient has been doing well, he started to walk about 20-30 minutes a day, he is going to start cardiac rehabilitation on March 1. He denies any palpitations, no dizziness, no syncope, no  orthopnea, no lower extremity edema. He's complaining is profound fatigue, lack of energy and easy bruising.  10/23/2016 - 3 months follow-up, the patient states that he continues to feel significantly tired, he exercises every day but has to take break and then has to take a nap after exercise. He is to exercise every day but his hospital course and procedure prolonged course and he'll lost his conditioning. He denies any chest pain shortness of breath, no palpitation no lower extremity edema. He has history of wheezing on a daily basis. He continues to bruise easily but nosebleeds have resolved.  11/24/2016 - the patient is coming after 1 months, he feels overall much stronger, he exercises in and out of the pool every day, he continues to experience post exertional fatigue that is slightly improved from a month ago. He has also been experiencing problems with balance. He is asking if we can discontinue helical S as he has easy bruising but otherwise no bleeding.  01/05/17 - 6 week follow up, we were contacted by Dr Loletha Carrow, his son in law in July that his father had an episode of presyncope, he was seen by Dr Sallyanne Kuster on 12/05/16, it was concluded that it was sec to orthostatic hypotension.  We interrogated his PM and found that he had an episode of a-fib with RVR, we increased cardizem CD to 180 mg po daily. He was instructed to stay hydrated and monitor BP for hypotension. He was explained that if he has more episodes, we might need to restart amiodarone (+ metoprolol )  again. The interrogation also showed A\a very brief episode of ectopic atrial tachycardia, possible atrial flutter with 2-1 AV conduction, but preceded his symptoms by at least 15 minutes and lasted for less than 3 seconds. Unlikely to have any relationship with this complaint. The patient also states that he has been profoundly tired with minimal activity and has chest pressure on exertion. This feels like before he got his stent placed. He  also has episodes of dizziness every day. We have gotten interrogation of his pacer today in the clinic he has had 9 episodes of rapid atrial fibrillation in the last months but the last episode was on July 31. He brings a diary and he had more episodes of dizziness and weakness on August 2nd, 5th, 6th. No syncope.  03/26/2017 -patient is coming after 2 months, he is accompanied by his wife both seem to be doing well. He just underwent cataract surgery with significant improvement in his vision and is scheduled to undergo cataract surgery next week. He continues to exercise, he walks 5 times a week with a walker for half an hour in the [facility]. He denies any falls he has occasional dizziness but no syncope. Denies palpitations. He has completed his amiodarone load and now is on 200 mg daily. He would prefer to take metoprolol just once daily. He has no side effects from his medications. He denies any chest pain.  Past Medical History:  Diagnosis Date  . Age-related macular degeneration   . Allergic rhinitis   . Aortic valve stenosis   . Arthritis    "hands, lower back" (04/30/2016)  . Asthma   . Carotid artery obstruction   . Chronic lower back pain   . Coronary arteriosclerosis in native artery   . Diverticulitis of colon   . DJD (degenerative joint disease)   . Dyspnea    "constant but the degree changes"  . Dysthymia   . GERD (gastroesophageal reflux disease)   . History of elevated PSA 05/2009  . History of hiatal hernia   . HLD (hyperlipidemia)   . Hypertension   . Nocturnal hypoxemia   . OSA (obstructive sleep apnea)    intolerant to CPAP  . Paroxysmal supraventricular tachycardia ([place])   . Pneumonia    "when I was a kid"  . Presence of permanent cardiac pacemaker   . Primary malignant neoplasm of bladder ([place])   . Prostate cancer [facility])     Past Surgical History:  Procedure Laterality Date  . APPENDECTOMY    . CARDIAC CATHETERIZATION N/A 01/17/2016   Procedure:  Right/Left Heart Cath and Coronary Angiography;  Surgeon: Belva Crome, MD;  Location: [place] [facility];  Service: Cardiovascular;  Laterality: N/A;  . CARDIAC CATHETERIZATION N/A 04/30/2016   Procedure: Coronary/Graft Atherectomy;  Surgeon: Sherren Mocha, MD;  Location: [place] [facility];  Service: Cardiovascular;  Laterality: N/A;  . CAROTID ENDARTERECTOMY Bilateral   . CATARACT EXTRACTION W/ INTRAOCULAR LENS  IMPLANT, BILATERAL Bilateral   . COLON SURGERY  1981   Meckles Diverticulum with volvulus  . EP IMPLANTABLE DEVICE N/A 03/21/2016   Procedure: Pacemaker Implant;  Surgeon: Evans Lance, MD;  Location: [place] [facility];  Service: Cardiovascular;  Laterality: N/A;  . INGUINAL HERNIA REPAIR Right 2009  . INSERTION PROSTATE RADIATION SEED    . TEE WITHOUT CARDIOVERSION N/A 06/10/2016   Procedure: TRANSESOPHAGEAL ECHOCARDIOGRAM (TEE);  Surgeon: Sherren Mocha, MD;  Location: [place];  Service: Open Heart Surgery;  Laterality: N/A;  .  TONSILLECTOMY  ~ 1947  . TRANSCATHETER AORTIC VALVE REPLACEMENT, TRANSFEMORAL N/A 06/10/2016   Procedure: TRANSCATHETER AORTIC VALVE REPLACEMENT, TRANSFEMORAL;  Surgeon: Sherren Mocha, MD;  Location: [place];  Service: Open Heart Surgery;  Laterality: N/A;   Current Medications: Outpatient Medications Prior to Visit  Medication Sig Dispense Refill  . acetaminophen (TYLENOL) 500 MG tablet Take 500 mg by mouth as needed for mild pain.     Marland Kitchen atorvastatin (LIPITOR) 20 MG tablet Take 1 tablet (20 mg total) by mouth daily. 90 tablet 3  . cholecalciferol (VITAMIN D) 1000 units tablet Take 1,000 Units by mouth 2 (two) times a week.     . Coenzyme Q10 (COQ-10 PO) Take 1 capsule by mouth 2 (two) times a week.     . Cyanocobalamin (VITAMIN B 12 PO) Take 1 capsule by mouth daily.    Marland Kitchen ELIQUIS 2.5 MG TABS tablet TAKE 1 TABLET(2.5 MG) BY MOUTH TWICE DAILY 60 tablet 5  . latanoprost (XALATAN) 0.005 % ophthalmic solution Place 1 drop into both eyes at bedtime.    .  nitroGLYCERIN (NITROSTAT) 0.4 MG SL tablet Place 1 tablet (0.4 mg total) under the tongue every 5 (five) minutes as needed. 25 tablet 3  . amiodarone (PACERONE) 200 MG tablet Take 200 mg by mouth daily.    . metoprolol tartrate (LOPRESSOR) 25 MG tablet Take 0.5 tablets (12.5 mg total) by mouth 2 (two) times daily. 180 tablet 3   No facility-administered medications prior to visit.      Allergies:   Ciprofloxacin; Hydrochlorothiazide; Sulfa antibiotics; Ace inhibitors; Losartan; Carvedilol; and Lisinopril   Social History   Social History  . Marital status: Married    Spouse name: N/A  . Number of children: N/A  . Years of education: N/A   Occupational History  . retired    Social History Main Topics  . Smoking status: Never Smoker  . Smokeless tobacco: Never Used  . Alcohol use No     Comment: 04/30/2016 "nothing in years"  . Drug use: No  . Sexual activity: No   Other Topics Concern  . None   Social History Narrative  . None    Family History:  The patient's family history includes Heart disease in his father and mother.   ROS:   Please see the history of present illness.    ROS All other systems reviewed and are negative.  PHYSICAL EXAM:   VS:  BP (!) 142/80   Pulse 80   Ht 5\' 4"  (1.626 m)   Wt 141 lb (64 kg)   SpO2 99%   BMI 24.20 kg/m    GEN: Well nourished, well developed, in no acute distress  HEENT: normal  Neck: no JVD, carotid bruits, or masses Cardiac: RRR; 2/6 systolic murmurs, rubs, or gallops,no edema  Respiratory:  clear to auscultation bilaterally, normal work of breathing GI: soft, nontender, nondistended, + BS MS: no deformity or atrophy  Skin: warm and dry, no rash Neuro:  Alert and Oriented x 3, Strength and sensation are intact Psych: euthymic mood, full affect  Wt Readings from Last 3 Encounters:  03/26/17 141 lb (64 kg)  01/27/17 137 lb 12.8 oz (62.5 kg)  01/06/17 138 lb (62.6 kg)    Studies/Labs Reviewed:   EKG:  Was personally  reviewed and shows HL paced rhythm, left bundle branch block,  Recent Labs: 06/06/2016: ALT 32 06/11/2016: Magnesium 2.3 12/05/2016: BUN 19; Creatinine, Ser 1.11; Hemoglobin 11.8; Platelets 151; Potassium 4.4; Sodium 136  Lipid Panel    Component Value Date/Time   CHOL 147 03/20/2016 0422   TRIG 54 03/20/2016 0422   HDL 61 03/20/2016 0422   CHOLHDL 2.4 03/20/2016 0422   VLDL 11 03/20/2016 0422   LDLCALC 75 03/20/2016 0422    Additional studies/ records that were reviewed today include:   - Left ventricle: The cavity size was normal. Wall thickness was   increased in a pattern of mild LVH. Systolic function was normal.   The estimated ejection fraction was in the range of 50% to 55%.   Doppler parameters are consistent with abnormal left ventricular   relaxation (grade 1 diastolic dysfunction). - Aortic valve: Post TAVR with trivial periprosthetic   regurgitation. - Aorta: Post TAVR with stable gradients and only trivial   perivalvular regurgitation. - Mitral valve: There was mild regurgitation. - Left atrium: The atrium was moderately dilated. - Atrial septum: No defect or patent foramen ovale was identified.    ASSESSMENT:    1. Coronary artery disease involving native coronary artery of native heart without angina pectoris   2. Mixed hyperlipidemia   3. S/P TAVR (transcatheter aortic valve replacement)   4. Need for immunization against influenza   5. Tachycardia-bradycardia syndrome ([place])   6. Chronic diastolic CHF (congestive heart failure) ([facility])   7. Cardiac pacemaker in situ   8. PAF (paroxysmal atrial fibrillation) ([place])   9. Chronic anticoagulation      PLAN:  In order of problems listed above:  1. CAD, he underwent successful rotational atherectomy and stenting of the RCA with a 3 x 12 mm Synergy DES on 04/30/16 by Dr Burt Knack. Residual disease will be treated medically (Mid Cx to Dist Cx lesion, 70% stenosed. The lesion is type C. The lesion is calcified).  Because of shortness of breath he underwent a pharmacologic nuclear stress test that was negative for ischemia. He continues to exercise. He is not on aspirin as he is taking Eliquis. Switch from metoprolol 12.5 mg by mouth twice a day to Toprol-XL 25 mg daily.   2. Paroxysmal atrial fibrillation with rapid ventricular response - he has completed amiodarone load, currently on 2 mg daily. Continue Eliquis 2.5 mg by mouth twice a day, there is no bleeding..  3. Aortic stenosis -  status post TAVR on 06/11/2015, with great results normal transaortic gradients 8 mmHg in 07/2016 and trivial paravalvular leak only.   4. Chronic diastolic CHF - Volume appears stable. He is NYHA has improved from 2b ->2a.  5. HTN - controlled.   6. HL - Continue statin. Decrease dose of Lipitor to 20 mg po daily as he is experiencing memory problems.  7. PPM - FU with EP as planned. Followed by Dr. Lovena Le.  Medication Adjustments/Labs and Tests Ordered: Current medicines are reviewed at length with the patient today.  Concerns regarding medicines are outlined above.  Medication changes, Labs and Tests ordered today are listed in the Patient Instructions below. Patient Instructions  Medication Instructions:   STOP TAKING REGULAR METOPROLOL TARTRATE NOW  START TAKING TOPROL XL (METOPROLOL SUCCINATE) 25 MG ONCE DAILY     Follow-Up:  3 MONTHS WITH DR Meda Coffee        If you need a refill on your cardiac medications before your next appointment, please call your pharmacy.      Signed, Ena Dawley, MD  03/26/2017 1:14 PM    [facility] [place], [place], [place]  [address] Phone: [phone]; Fax: [phone]    

## 2017-03-26 NOTE — Patient Instructions (Signed)
Medication Instructions:   STOP TAKING REGULAR METOPROLOL TARTRATE NOW  START TAKING TOPROL XL (METOPROLOL SUCCINATE) 25 MG ONCE DAILY     Follow-Up:  3 MONTHS WITH DR Meda Coffee        If you need a refill on your cardiac medications before your next appointment, please call your pharmacy.

## 2017-03-27 ENCOUNTER — Ambulatory Visit (INDEPENDENT_AMBULATORY_CARE_PROVIDER_SITE_OTHER): Payer: Medicare Other | Admitting: Internal Medicine

## 2017-03-27 ENCOUNTER — Encounter (INDEPENDENT_AMBULATORY_CARE_PROVIDER_SITE_OTHER): Payer: Self-pay

## 2017-03-27 ENCOUNTER — Encounter: Payer: Self-pay | Admitting: Internal Medicine

## 2017-03-27 ENCOUNTER — Telehealth: Payer: Self-pay

## 2017-03-27 VITALS — BP 132/60 | HR 68 | Resp 16 | Ht 64.0 in | Wt 140.2 lb

## 2017-03-27 DIAGNOSIS — I495 Sick sinus syndrome: Secondary | ICD-10-CM

## 2017-03-27 DIAGNOSIS — I480 Paroxysmal atrial fibrillation: Secondary | ICD-10-CM

## 2017-03-27 LAB — CUP PACEART INCLINIC DEVICE CHECK
Battery Impedance: 113 Ohm
Battery Voltage: 2.79 V
Brady Statistic AS VP Percent: 0 %
Date Time Interrogation Session: 20181026125123
Implantable Lead Implant Date: 20171020
Implantable Lead Location: 753860
Implantable Lead Model: 5076
Implantable Lead Model: 5076
Lead Channel Pacing Threshold Amplitude: 0.5 V
Lead Channel Pacing Threshold Pulse Width: 0.4 ms
Lead Channel Pacing Threshold Pulse Width: 0.4 ms
Lead Channel Sensing Intrinsic Amplitude: 11.2 mV
Lead Channel Sensing Intrinsic Amplitude: 4 mV
Lead Channel Setting Pacing Amplitude: 2 V
MDC IDC LEAD IMPLANT DT: 20171020
MDC IDC LEAD LOCATION: 753859
MDC IDC MSMT BATTERY REMAINING LONGEVITY: 140 mo
MDC IDC MSMT LEADCHNL RA IMPEDANCE VALUE: 493 Ohm
MDC IDC MSMT LEADCHNL RA PACING THRESHOLD AMPLITUDE: 0.5 V
MDC IDC MSMT LEADCHNL RA PACING THRESHOLD PULSEWIDTH: 0.4 ms
MDC IDC MSMT LEADCHNL RV IMPEDANCE VALUE: 667 Ohm
MDC IDC MSMT LEADCHNL RV PACING THRESHOLD AMPLITUDE: 0.5 V
MDC IDC MSMT LEADCHNL RV PACING THRESHOLD AMPLITUDE: 0.875 V
MDC IDC MSMT LEADCHNL RV PACING THRESHOLD PULSEWIDTH: 0.4 ms
MDC IDC PG IMPLANT DT: 20171020
MDC IDC SET LEADCHNL RV PACING AMPLITUDE: 2.5 V
MDC IDC SET LEADCHNL RV PACING PULSEWIDTH: 0.4 ms
MDC IDC SET LEADCHNL RV SENSING SENSITIVITY: 4 mV
MDC IDC STAT BRADY AP VP PERCENT: 0 %
MDC IDC STAT BRADY AP VS PERCENT: 97 %
MDC IDC STAT BRADY AS VS PERCENT: 3 %

## 2017-03-27 MED ORDER — AMIODARONE HCL 200 MG PO TABS: ORAL_TABLET | ORAL | 3 refills | Status: DC

## 2017-03-27 NOTE — Patient Instructions (Addendum)
Medication Instructions:  Your physician has recommended you make the following change in your medication:  1.  Reduce your amiodarone.  Take amiodarone 200 mg (one tablet) by mouth daily Monday - Saturday.  Do not take amiodarone on Sunday.   Labwork: None ordered.  Testing/Procedures: None ordered.  Follow-Up: Your physician wants you to follow-up in: one year with Dr. Lovena Le.   You will receive a reminder letter in the mail two months in advance. If you don't receive a letter, please call our office to schedule the follow-up appointment.  Remote monitoring is used to monitor your Pacemaker from home. This monitoring reduces the number of office visits required to check your device to one time per year. It allows [place] to keep an eye on the functioning of your device to ensure it is working properly. You are scheduled for a device check from home on 06/29/2016. You may send your transmission at any time that day. If you have a wireless device, the transmission will be sent automatically. After your physician reviews your transmission, you will receive a postcard with your next transmission date.    Any Other Special Instructions Will Be Listed Below (If Applicable).     If you need a refill on your cardiac medications before your next appointment, please call your pharmacy.

## 2017-03-27 NOTE — Progress Notes (Signed)
HPI Ryan [name] returns today for ongoing evaluation and management of paroxysmal atrial fibrillation, sinus node dysfunction, status post pacemaker insertion. In the interim he has undergone percutaneous aortic valve replacement. His main complaint today is knee pain when he exerts himself. He also has some anginal symptoms which he is treating with nitroglycerin. No syncope. Allergies  Allergen Reactions  . Ciprofloxacin Anaphylaxis  . Hydrochlorothiazide Anaphylaxis  . Sulfa Antibiotics Anaphylaxis  . Ace Inhibitors Cough  . Losartan Cough  . Carvedilol Cough    Pt states it "causes him too cough."  . Lisinopril Cough    Pt reports worsening cough and "feeling wheezy."     Current Outpatient Prescriptions  Medication Sig Dispense Refill  . acetaminophen (TYLENOL) 500 MG tablet Take 500 mg by mouth as needed for mild pain.     Marland Kitchen amiodarone (PACERONE) 200 MG tablet Take 1 tablet (200 mg total) by mouth daily. 90 tablet 2  . atorvastatin (LIPITOR) 20 MG tablet Take 1 tablet (20 mg total) by mouth daily. 90 tablet 3  . cholecalciferol (VITAMIN D) 1000 units tablet Take 1,000 Units by mouth 2 (two) times a week.     . Coenzyme Q10 (COQ-10 PO) Take 1 capsule by mouth 2 (two) times a week.     . Cyanocobalamin (VITAMIN B 12 PO) Take 1 capsule by mouth daily.    Marland Kitchen ELIQUIS 2.5 MG TABS tablet TAKE 1 TABLET(2.5 MG) BY MOUTH TWICE DAILY 60 tablet 5  . latanoprost (XALATAN) 0.005 % ophthalmic solution Place 1 drop into both eyes at bedtime.    . metoprolol succinate (TOPROL XL) 25 MG 24 hr tablet Take 1 tablet (25 mg total) by mouth daily. 90 tablet 2  . nitroGLYCERIN (NITROSTAT) 0.4 MG SL tablet Place 1 tablet (0.4 mg total) under the tongue every 5 (five) minutes as needed. 25 tablet 3  . tobramycin (TOBREX) 0.3 % ophthalmic solution INT 1 GTT INTO OS QID STARTING 1 DAY B SURGERY U UNTIL GONE  0   No current facility-administered medications for this visit.      Past Medical  History:  Diagnosis Date  . Age-related macular degeneration   . Allergic rhinitis   . Aortic valve stenosis   . Arthritis    "hands, lower back" (04/30/2016)  . Asthma   . Carotid artery obstruction   . Chronic lower back pain   . Coronary arteriosclerosis in native artery   . Diverticulitis of colon   . DJD (degenerative joint disease)   . Dyspnea    "constant but the degree changes"  . Dysthymia   . GERD (gastroesophageal reflux disease)   . History of elevated PSA 05/2009  . History of hiatal hernia   . HLD (hyperlipidemia)   . Hypertension   . Nocturnal hypoxemia   . OSA (obstructive sleep apnea)    intolerant to CPAP  . Paroxysmal supraventricular tachycardia ([place])   . Pneumonia    "when I was a kid"  . Presence of permanent cardiac pacemaker   . Primary malignant neoplasm of bladder ([place])   . Prostate cancer ([place])     ROS:   All systems reviewed and negative except as noted in the HPI.   Past Surgical History:  Procedure Laterality Date  . APPENDECTOMY    . CARDIAC CATHETERIZATION N/A 01/17/2016   Procedure: Right/Left Heart Cath and Coronary Angiography;  Surgeon: Belva Crome, MD;  Location: [facility];  Service: Cardiovascular;  Laterality:  N/A;  . CARDIAC CATHETERIZATION N/A 04/30/2016   Procedure: Coronary/Graft Atherectomy;  Surgeon: Sherren Mocha, MD;  Location: [facility];  Service: Cardiovascular;  Laterality: N/A;  . CAROTID ENDARTERECTOMY Bilateral   . CATARACT EXTRACTION W/ INTRAOCULAR LENS  IMPLANT, BILATERAL Bilateral   . COLON SURGERY  1981   Meckles Diverticulum with volvulus  . EP IMPLANTABLE DEVICE N/A 03/21/2016   Procedure: Pacemaker Implant;  Surgeon: Evans Lance, MD;  Location: [facility];  Service: Cardiovascular;  Laterality: N/A;  . INGUINAL HERNIA REPAIR Right 2009  . INSERTION PROSTATE RADIATION SEED    . TEE WITHOUT CARDIOVERSION N/A 06/10/2016   Procedure: TRANSESOPHAGEAL ECHOCARDIOGRAM (TEE);  Surgeon:  Sherren Mocha, MD;  Location: [facility];  Service: Open Heart Surgery;  Laterality: N/A;  . TONSILLECTOMY  ~ 1947  . TRANSCATHETER AORTIC VALVE REPLACEMENT, TRANSFEMORAL N/A 06/10/2016   Procedure: TRANSCATHETER AORTIC VALVE REPLACEMENT, TRANSFEMORAL;  Surgeon: Sherren Mocha, MD;  Location: [facility];  Service: Open Heart Surgery;  Laterality: N/A;     Family History  Problem Relation Age of Onset  . Heart disease Mother   . Heart disease Father      Social History   Social History  . Marital status: Married    Spouse name: N/A  . Number of children: N/A  . Years of education: N/A   Occupational History  . retired    Social History Main Topics  . Smoking status: Never Smoker  . Smokeless tobacco: Never Used  . Alcohol use No     Comment: 04/30/2016 "nothing in years"  . Drug use: No  . Sexual activity: No   Other Topics Concern  . Not on file   Social History Narrative  . No narrative on file     BP 132/60   Pulse 68   Resp 16   Ht 5\' 4"  (1.626 m)   Wt 140 lb 3.2 oz (63.6 kg)   SpO2 97%   BMI 24.07 kg/m   Physical Exam:  Well appearing NAD HEENT: Unremarkable Neck:  No JVD, no thyromegally Lymphatics:  No adenopathy Back:  No CVA tenderness Lungs:  Clear HEART:  Regular rate rhythm, no murmurs, no rubs, no clicks Abd:  soft, positive bowel sounds, no organomegally, no rebound, no guarding Ext:  2 plus pulses, no edema, no cyanosis, no clubbing Skin:  No rashes no nodules Neuro:  CN II through XII intact, motor grossly intact   DEVICE  Normal device function.  See PaceArt for details.   Assess/Plan: 1. Sinus node dysfunction - he is doing well status post pacemaker insertion. 2. Paroxysmal atrial fibrillation - he is maintaining sinus rhythm very nicely on amiodarone. I've asked the patient to reduce his dose by holding it on Sundays and taking 1 tablet daily Monday through Saturday. 3. Angina - he has exertional angina. He will take nitroglycerin  sublingual as needed. He is instructed to notify his primary cardiologist if his symptoms worsen. 4. Pacemaker - his Medtronic dual-chamber pacemaker is working normally. Plan to recheck in several months.  Cristopher Peru, M.D.

## 2017-03-27 NOTE — Telephone Encounter (Signed)
Detailed message left per DPR.  Reiterated Dr. Lovena Le would like Pt to decrease his amiodarone to 200 mg (one tab) by mouth daily Monday - Saturday.  Do not take on Sunday.  Left this nurse name and # for call back if any questions.

## 2017-04-14 ENCOUNTER — Ambulatory Visit: Payer: Medicare Other | Admitting: Adult Health

## 2017-04-14 ENCOUNTER — Encounter: Payer: Self-pay | Admitting: Adult Health

## 2017-04-14 VITALS — BP 160/76 | Temp 97.5°F | Ht 65.5 in | Wt 141.0 lb

## 2017-04-14 DIAGNOSIS — Z7689 Persons encountering health services in other specified circumstances: Secondary | ICD-10-CM | POA: Diagnosis not present

## 2017-04-14 DIAGNOSIS — I110 Hypertensive heart disease with heart failure: Secondary | ICD-10-CM | POA: Diagnosis not present

## 2017-04-14 DIAGNOSIS — C61 Malignant neoplasm of prostate: Secondary | ICD-10-CM | POA: Diagnosis not present

## 2017-04-14 NOTE — Patient Instructions (Signed)
It was great meeting you today   I am going to have you increase your Metoprolol from 25 mg to 50 mg daily. Monitor your blood pressure at home and bring your log to the next appointment. If you feel lightheaded or dizzy then it is ok to go back to the 25 mg daily.   Follow up with me in two weeks for your physical   Someone from Urology will call you to schedule your appointment

## 2017-04-14 NOTE — Progress Notes (Addendum)
Patient presents to clinic today to establish care. He is a pleasant 83 year old male who  has a past medical history of Age-related macular degeneration, Allergic rhinitis, Aortic valve stenosis, Arthritis, Asthma, Carotid artery obstruction, Chronic lower back pain, Coronary arteriosclerosis in native artery, Diverticulitis of colon, DJD (degenerative joint disease), Dyspnea, Dysthymia, GERD (gastroesophageal reflux disease), History of elevated PSA (05/2009), History of hiatal hernia, HLD (hyperlipidemia), Hypertension, Nocturnal hypoxemia, OSA (obstructive sleep apnea), Paroxysmal supraventricular tachycardia ([place]), Pneumonia, Presence of permanent cardiac pacemaker, Primary malignant neoplasm of bladder ([place]), and Prostate cancer ([place]).  He is due for a CPE   Acute Concerns: Establish Care   Chronic Issues: AVR - takes Eliquis 2.5 mg   Sinus node dysfunction - doing well s/p pacemaker insertion   Paroxysmal atrial fibrillation - controlled on amiodarone   Hypertension - Recently prescribed 25 mg Toprol. This was changed from Metoprolol 25 mg BID he reports that at home he has had consistent blood pressure readings in the 937'J systolic at home since this medication change. He also reports a feeling of head pressure and " jumpiness like I have anxiety". Denies any CP or SOB   Hyperlipidemia - Takes Lipitor 20 mg   Prostate Cancer - Radiation therapy   Health Maintenance: Dental -- Does not do routine care  Vision -- Routine  Immunizations -- UTD  Colonoscopy -- No longer needs   Is followed by   Cardiology - Sees Dr. Lovena Le and Meda Coffee.  Urology - Was seen by Urology in [place]. He would like to see someone locally.  Opthalmology   Past Medical History:  Diagnosis Date  . Age-related macular degeneration   . Allergic rhinitis   . Aortic valve stenosis   . Arthritis    "hands, lower back" (04/30/2016)  . Asthma   . Carotid artery obstruction   . Chronic lower back  pain   . Coronary arteriosclerosis in native artery   . Diverticulitis of colon   . DJD (degenerative joint disease)   . Dyspnea    "constant but the degree changes"  . Dysthymia   . GERD (gastroesophageal reflux disease)   . History of elevated PSA 05/2009  . History of hiatal hernia   . HLD (hyperlipidemia)   . Hypertension   . Nocturnal hypoxemia   . OSA (obstructive sleep apnea)    intolerant to CPAP  . Paroxysmal supraventricular tachycardia ([place])   . Pneumonia    "when I was a kid"  . Presence of permanent cardiac pacemaker   . Primary malignant neoplasm of bladder ([place])   . Prostate cancer [facility])     Past Surgical History:  Procedure Laterality Date  . APPENDECTOMY    . CAROTID ENDARTERECTOMY Bilateral   . CATARACT EXTRACTION W/ INTRAOCULAR LENS  IMPLANT, BILATERAL Bilateral   . COLON SURGERY  1981   Meckles Diverticulum with volvulus  . INGUINAL HERNIA REPAIR Right 2009  . INSERTION PROSTATE RADIATION SEED    . TONSILLECTOMY  ~ 1947    Current Outpatient Medications on File Prior to Visit  Medication Sig Dispense Refill  . acetaminophen (TYLENOL) 500 MG tablet Take 500 mg by mouth as needed for mild pain.     Marland Kitchen amiodarone (PACERONE) 200 MG tablet Take 200 mg (one tablet) by mouth once daily Mon-Sat.  Do not take on Sunday. 90 tablet 3  . atorvastatin (LIPITOR) 20 MG tablet Take 1 tablet (20 mg total) by mouth daily. 90 tablet 3  . cholecalciferol (  VITAMIN D) 1000 units tablet Take 1,000 Units by mouth 2 (two) times a week.     . Coenzyme Q10 (COQ-10 PO) Take 1 capsule by mouth 2 (two) times a week.     . Cyanocobalamin (VITAMIN B 12 PO) Take 1 capsule by mouth daily.    Marland Kitchen ELIQUIS 2.5 MG TABS tablet TAKE 1 TABLET(2.5 MG) BY MOUTH TWICE DAILY 60 tablet 5  . latanoprost (XALATAN) 0.005 % ophthalmic solution Place 1 drop into both eyes at bedtime.    . metoprolol succinate (TOPROL XL) 25 MG 24 hr tablet Take 1 tablet (25 mg total) by mouth daily. 90 tablet 2  .  nitroGLYCERIN (NITROSTAT) 0.4 MG SL tablet Place 1 tablet (0.4 mg total) under the tongue every 5 (five) minutes as needed. 25 tablet 3   No current facility-administered medications on file prior to visit.     Allergies  Allergen Reactions  . Ciprofloxacin Anaphylaxis  . Hydrochlorothiazide Anaphylaxis  . Sulfa Antibiotics Anaphylaxis  . Ace Inhibitors Cough  . Losartan Cough  . Carvedilol Cough    Pt states it "causes him too cough."  . Lisinopril Cough    Pt reports worsening cough and "feeling wheezy."    Family History  Problem Relation Age of Onset  . Heart disease Mother   . Heart disease Father     Social History   Socioeconomic History  . Marital status: Married    Spouse name: Not on file  . Number of children: Not on file  . Years of education: Not on file  . Highest education level: Not on file  Social Needs  . Financial resource strain: Not on file  . Food insecurity - worry: Not on file  . Food insecurity - inability: Not on file  . Transportation needs - medical: Not on file  . Transportation needs - non-medical: Not on file  Occupational History  . Occupation: retired  Tobacco Use  . Smoking status: Never Smoker  . Smokeless tobacco: Never Used  Substance and Sexual Activity  . Alcohol use: No    Alcohol/week: 0.0 oz    Comment: 04/30/2016 "nothing in years"  . Drug use: No  . Sexual activity: No  Other Topics Concern  . Not on file  Social History Narrative   Retired    Three children - One in [place], One in [place], and one in [place].        Review of Systems  Constitutional: Negative.   HENT: Negative.   Eyes: Negative.   Respiratory: Negative.   Cardiovascular: Negative.   Genitourinary: Negative.   Musculoskeletal: Positive for back pain and joint pain.  Skin: Negative.   Neurological: Positive for headaches.  Psychiatric/Behavioral: Negative.   All other systems reviewed and are negative.   BP (!) 160/76 (BP Location: Left  Arm)   Temp (!) 97.5 F (36.4 C) (Oral)   Ht 5' 5.5" (1.664 m)   Wt 141 lb (64 kg)   BMI 23.11 kg/m   Physical Exam  Constitutional: He is oriented to person, place, and time and well-developed, well-nourished, and in no distress. No distress.  HENT:  Head: Normocephalic and atraumatic.  Right Ear: External ear normal.  Left Ear: External ear normal.  Nose: Nose normal.  Mouth/Throat: Oropharynx is clear and moist.  Neck: Normal range of motion. Neck supple.  Cardiovascular: Normal rate, regular rhythm, normal heart sounds and intact distal pulses. Exam reveals no gallop and no friction rub.  No murmur heard. Pulmonary/Chest:  Effort normal and breath sounds normal. No respiratory distress. He has no wheezes. He has no rales. He exhibits no tenderness.  Musculoskeletal: He exhibits no edema, tenderness or deformity.  Lymphadenopathy:    He has no cervical adenopathy.  Neurological: He is alert and oriented to person, place, and time. Gait normal. GCS score is 15.  Skin: Skin is warm and dry. No rash noted. He is not diaphoretic. No erythema. No pallor.  Psychiatric: Mood, memory, affect and judgment normal.  Nursing note and vitals reviewed.   Recent Results (from the past 2160 hour(s))  CUP PACEART INCLINIC DEVICE CHECK     Status: None   Collection Time: 03/27/17  5:32 PM  Result Value Ref Range   Date Time Interrogation Session [id number]    Pulse Generator Manufacturer MERM    Pulse Gen Model ADDRL1 Adapta    Pulse Gen Serial Number [id number]    Clinic Name [facility]    Implantable Pulse Generator Type Implantable Pulse Generator    Implantable Pulse Generator Implant Date [id number]    Implantable Lead Manufacturer MERM    Implantable Lead Model 5076 CapSureFix Novus    Implantable Lead Serial Number [id number]    Implantable Lead Implant Date [id number]    Implantable Lead Location Detail 1 UNKNOWN    Implantable Lead Location [id number]    Implantable Lead  Manufacturer MERM    Implantable Lead Model 5076 CapSureFix Novus    Implantable Lead Serial Number [id number]    Implantable Lead Implant Date [id number]    Implantable Lead Location Detail 1 UNKNOWN    Implantable Lead Location [id number]    Lead Channel Setting Sensing Sensitivity 4.00 mV   Lead Channel Setting Pacing Amplitude 2.000 V   Lead Channel Setting Pacing Pulse Width 0.40 ms   Lead Channel Setting Pacing Amplitude 2.500 V   Lead Channel Impedance Value 493 ohm   Lead Channel Sensing Intrinsic Amplitude 4.00 mV   Lead Channel Pacing Threshold Amplitude 0.500 V   Lead Channel Pacing Threshold Pulse Width 0.40 ms   Lead Channel Pacing Threshold Amplitude 0.500 V   Lead Channel Pacing Threshold Pulse Width 0.40 ms   Lead Channel Impedance Value 667 ohm   Lead Channel Sensing Intrinsic Amplitude 11.20 mV   Lead Channel Pacing Threshold Amplitude 0.500 V   Lead Channel Pacing Threshold Pulse Width 0.40 ms   Lead Channel Pacing Threshold Amplitude 0.875 V   Lead Channel Pacing Threshold Pulse Width 0.40 ms   Battery Status OK    Battery Remaining Longevity 140 mo   Battery Voltage 2.79 V   Battery Impedance 113 ohm   Brady Statistic AP VP Percent 0 %   Brady Statistic AS VP Percent 0 %   Brady Statistic AP VS Percent 97 %   Brady Statistic AS VS Percent 3 %   Eval Rhythm SB 51     Assessment/Plan:  1. Encounter to establish care - Very pleasant active gentleman  - Follow up in 2 weeks for CPE  - Follow up sooner if needed  2. Hypertensive heart disease with heart failure (HCC) - Elevated today. Will have him increase Toprol to 50 mg daily.  - Bring blood pressure log to CPE  - Advised if he experienced any lightheadedness or dizziness then to notify me and go back to 25 mg Toprol   3. Prostate cancer [facility])  - Ambulatory referral to Urology  Dorothyann Peng, NP

## 2017-04-14 NOTE — Addendum Note (Signed)
Addended by: Apolinar Junes on: 04/14/2017 10:23 AM   Modules accepted: Orders

## 2017-04-16 ENCOUNTER — Other Ambulatory Visit: Payer: Self-pay

## 2017-04-16 DIAGNOSIS — Z952 Presence of prosthetic heart valve: Secondary | ICD-10-CM

## 2017-04-16 DIAGNOSIS — I350 Nonrheumatic aortic (valve) stenosis: Secondary | ICD-10-CM

## 2017-04-30 ENCOUNTER — Telehealth: Payer: Self-pay | Admitting: *Deleted

## 2017-04-30 ENCOUNTER — Ambulatory Visit (INDEPENDENT_AMBULATORY_CARE_PROVIDER_SITE_OTHER): Payer: Medicare Other | Admitting: Adult Health

## 2017-04-30 ENCOUNTER — Encounter: Payer: Self-pay | Admitting: Adult Health

## 2017-04-30 VITALS — BP 140/82 | Temp 97.6°F | Ht 65.0 in | Wt 141.0 lb

## 2017-04-30 DIAGNOSIS — I110 Hypertensive heart disease with heart failure: Secondary | ICD-10-CM

## 2017-04-30 DIAGNOSIS — Z0000 Encounter for general adult medical examination without abnormal findings: Secondary | ICD-10-CM

## 2017-04-30 DIAGNOSIS — Z125 Encounter for screening for malignant neoplasm of prostate: Secondary | ICD-10-CM

## 2017-04-30 DIAGNOSIS — Z23 Encounter for immunization: Secondary | ICD-10-CM

## 2017-04-30 DIAGNOSIS — R269 Unspecified abnormalities of gait and mobility: Secondary | ICD-10-CM

## 2017-04-30 DIAGNOSIS — E7800 Pure hypercholesterolemia, unspecified: Secondary | ICD-10-CM

## 2017-04-30 NOTE — Telephone Encounter (Signed)
Copied from [place] [id number]. Topic: General - Other >> Apr 30, 2017  1:49 PM Scherrie Gerlach wrote: Reason for CRM:  Pt would like Tommi Rumps to know that the urologist he referred him to is a dr he is already established with.  Pt is happy to see him again, it is Dr Tammi Klippel. Just wanted cory to know

## 2017-04-30 NOTE — Progress Notes (Signed)
Subjective:    Patient ID: Ryan [name], male    DOB: Aug 09, 1929, 83 y.o.   MRN: [id number]  HPI  Patient presents for yearly preventative medicine examination. He is a pleasant 83 year old male who  has a past medical history of Age-related macular degeneration, Allergic rhinitis, Aortic valve stenosis, Arthritis, Asthma, Carotid artery obstruction, Chronic lower back pain, Coronary arteriosclerosis in native artery, Diverticulitis of colon, DJD (degenerative joint disease), Dyspnea, Dysthymia, GERD (gastroesophageal reflux disease), History of elevated PSA (05/2009), History of hiatal hernia, HLD (hyperlipidemia), Hypertension, Nocturnal hypoxemia, OSA (obstructive sleep apnea), Paroxysmal supraventricular tachycardia ([place]), Pneumonia, Presence of permanent cardiac pacemaker, Primary malignant neoplasm of bladder ([place]), and Prostate cancer ([place]).  AVR - takes Eliquis 2.5 mg - stable   Sinus node dysfunction - doing well s/p pacemaker insertion   Paroxysmal atrial fibrillation - controlled on amiodarone   Hypertension -  During his visit two weeks ago Metoprolol was increased from 25 mg daily to 50mg  daily due to elevated blood pressure readings. He was advised to go back to 25 mg daily if he felt dizzy or lightheaded. He reports that he was feeling dizzy about two days ago and went back to the 25 mg. He has been monitoring his blood pressure at home and has readings between 126/71 and 175/84. This morning at home his blood pressure was 126/71. In the office it is 140/82  Hyperlipidemia - Takes Lipitor 20 mg   Hx of Prostate Cancer - Radiation therapy completed   All immunizations and health maintenance protocols were reviewed with the patient and needed orders were placed.  Appropriate screening laboratory values were ordered for the patient including screening of hyperlipidemia, renal function and hepatic function. If indicated by BPH, a PSA was ordered.  Medication  reconciliation,  past medical history, social history, problem list and allergies were reviewed in detail with the patient  Goals were established with regard to weight loss, exercise, and  diet in compliance with medications  End of life planning was discussed. He has an advanced directive and living will  He no longer needs to have a colonoscopy. He does routine vision screens.   He reports that as of lately he has been feeling off balance and that his gait is not strong. He has not fallen but he has felt off balance quite often.     Review of Systems  Constitutional: Positive for fatigue.  HENT: Negative.   Eyes: Negative.   Respiratory: Negative.   Cardiovascular: Negative.   Gastrointestinal: Negative.   Endocrine: Negative.   Genitourinary: Negative.   Musculoskeletal: Positive for arthralgias, back pain and gait problem.  Skin: Negative.   Allergic/Immunologic: Negative.   Neurological: Positive for dizziness.  Hematological: Negative.   Psychiatric/Behavioral: Negative.   All other systems reviewed and are negative.  Past Medical History:  Diagnosis Date  . Age-related macular degeneration   . Allergic rhinitis   . Aortic valve stenosis   . Arthritis    "hands, lower back" (04/30/2016)  . Asthma   . Carotid artery obstruction   . Chronic lower back pain   . Coronary arteriosclerosis in native artery   . Diverticulitis of colon   . DJD (degenerative joint disease)   . Dyspnea    "constant but the degree changes"  . Dysthymia   . GERD (gastroesophageal reflux disease)   . History of elevated PSA 05/2009  . History of hiatal hernia   . HLD (hyperlipidemia)   . Hypertension   .  Nocturnal hypoxemia   . OSA (obstructive sleep apnea)    intolerant to CPAP  . Paroxysmal supraventricular tachycardia ([place])   . Pneumonia    "when I was a kid"  . Presence of permanent cardiac pacemaker   . Primary malignant neoplasm of bladder ([place])   . Prostate cancer [facility])      Social History   Socioeconomic History  . Marital status: Married    Spouse name: Not on file  . Number of children: Not on file  . Years of education: Not on file  . Highest education level: Not on file  Social Needs  . Financial resource strain: Not on file  . Food insecurity - worry: Not on file  . Food insecurity - inability: Not on file  . Transportation needs - medical: Not on file  . Transportation needs - non-medical: Not on file  Occupational History  . Occupation: retired  Tobacco Use  . Smoking status: Never Smoker  . Smokeless tobacco: Never Used  Substance and Sexual Activity  . Alcohol use: No    Alcohol/week: 0.0 oz    Comment: 04/30/2016 "nothing in years"  . Drug use: No  . Sexual activity: No  Other Topics Concern  . Not on file  Social History Narrative   Retired    Three children - One in [place], One in [place], and one in [place].        Past Surgical History:  Procedure Laterality Date  . APPENDECTOMY    . CARDIAC CATHETERIZATION N/A 01/17/2016   Procedure: Right/Left Heart Cath and Coronary Angiography;  Surgeon: Belva Crome, MD;  Location: [place] [facility];  Service: Cardiovascular;  Laterality: N/A;  . CARDIAC CATHETERIZATION N/A 04/30/2016   Procedure: Coronary/Graft Atherectomy;  Surgeon: Sherren Mocha, MD;  Location: [place] [facility];  Service: Cardiovascular;  Laterality: N/A;  . CAROTID ENDARTERECTOMY Bilateral   . CATARACT EXTRACTION W/ INTRAOCULAR LENS  IMPLANT, BILATERAL Bilateral   . COLON SURGERY  1981   Meckles Diverticulum with volvulus  . EP IMPLANTABLE DEVICE N/A 03/21/2016   Procedure: Pacemaker Implant;  Surgeon: Evans Lance, MD;  Location: [place] [facility];  Service: Cardiovascular;  Laterality: N/A;  . INGUINAL HERNIA REPAIR Right 2009  . INSERTION PROSTATE RADIATION SEED    . TEE WITHOUT CARDIOVERSION N/A 06/10/2016   Procedure: TRANSESOPHAGEAL ECHOCARDIOGRAM (TEE);  Surgeon: Sherren Mocha, MD;  Location: [place];  Service: Open Heart Surgery;  Laterality: N/A;  . TONSILLECTOMY  ~ 1947  . TRANSCATHETER AORTIC VALVE REPLACEMENT, TRANSFEMORAL N/A 06/10/2016   Procedure: TRANSCATHETER AORTIC VALVE REPLACEMENT, TRANSFEMORAL;  Surgeon: Sherren Mocha, MD;  Location: [place];  Service: Open Heart Surgery;  Laterality: N/A;    Family History  Problem Relation Age of Onset  . Heart disease Mother   . Heart disease Father     Allergies  Allergen Reactions  . Ciprofloxacin Anaphylaxis  . Hydrochlorothiazide Anaphylaxis  . Sulfa Antibiotics Anaphylaxis  . Ace Inhibitors Cough  . Losartan Cough  . Carvedilol Cough    Pt states it "causes him too cough."  . Lisinopril Cough    Pt reports worsening cough and "feeling wheezy."    Current Outpatient Medications on File Prior to Visit  Medication Sig Dispense Refill  . acetaminophen (TYLENOL) 500 MG tablet Take 500 mg by mouth as needed for mild pain.     Marland Kitchen amiodarone (PACERONE) 200 MG tablet Take 200 mg (one tablet) by mouth once daily Mon-Sat.  Do not  take on Sunday. 90 tablet 3  . atorvastatin (LIPITOR) 20 MG tablet Take 1 tablet (20 mg total) by mouth daily. 90 tablet 3  . cholecalciferol (VITAMIN D) 1000 units tablet Take 1,000 Units by mouth 2 (two) times a week.     . Coenzyme Q10 (COQ-10 PO) Take 1 capsule by mouth 2 (two) times a week.     . Cyanocobalamin (VITAMIN B 12 PO) Take 1 capsule by mouth daily.    Marland Kitchen ELIQUIS 2.5 MG TABS tablet TAKE 1 TABLET(2.5 MG) BY MOUTH TWICE DAILY 60 tablet 5  . latanoprost (XALATAN) 0.005 % ophthalmic solution Place 1 drop into both eyes at bedtime.    . metoprolol succinate (TOPROL XL) 25 MG 24 hr tablet Take 1 tablet (25 mg total) by mouth daily. 90 tablet 2  . nitroGLYCERIN (NITROSTAT) 0.4 MG SL tablet Place 1 tablet (0.4 mg total) under the tongue every 5 (five) minutes as needed. 25 tablet 3   No current facility-administered medications on file prior to visit.     BP 140/82 (BP Location: Left Arm)   Temp  97.6 F (36.4 C) (Oral)   Ht 5\' 5"  (1.651 m)   Wt 141 lb (64 kg)   BMI 23.46 kg/m       Objective:   Physical Exam  Constitutional: He is oriented to person, place, and time. He appears well-developed and well-nourished. No distress.  HENT:  Head: Normocephalic and atraumatic.  Right Ear: External ear normal.  Left Ear: External ear normal.  Nose: Nose normal.  Mouth/Throat: Oropharynx is clear and moist. No oropharyngeal exudate.  Eyes: Conjunctivae and EOM are normal. Pupils are equal, round, and reactive to light. Right eye exhibits no discharge. Left eye exhibits no discharge. No scleral icterus.  Neck: Normal range of motion. Neck supple. Carotid bruit is not present. No tracheal deviation present. No thyroid mass and no thyromegaly present.  Cardiovascular: Normal rate, regular rhythm, normal heart sounds and intact distal pulses. Exam reveals no gallop and no friction rub.  No murmur heard. Pulmonary/Chest: Effort normal and breath sounds normal. No stridor. No respiratory distress. He has no wheezes. He has no rales. He exhibits no tenderness.  Abdominal: Soft. Bowel sounds are normal. He exhibits no distension and no mass. There is no hepatosplenomegaly, splenomegaly or hepatomegaly. There is no tenderness. There is no rebound and no guarding.  Genitourinary:  Genitourinary Comments: Deferred   Musculoskeletal: Normal range of motion. He exhibits tenderness (bilateral knee ). He exhibits no edema or deformity.  Lymphadenopathy:    He has no cervical adenopathy.  Neurological: He is alert and oriented to person, place, and time. He has normal strength and normal reflexes. He displays normal reflexes. No cranial nerve deficit or sensory deficit. He exhibits normal muscle tone. Coordination normal. GCS eye subscore is 4. GCS verbal subscore is 5. GCS motor subscore is 6.  Skin: Skin is warm and dry. No rash noted. He is not diaphoretic. No erythema. No pallor.  Psychiatric: He  has a normal mood and affect. His behavior is normal. Judgment and thought content normal.  Nursing note and vitals reviewed.     Assessment & Plan:  1. Routine general medical examination at a health care facility - Continue to stay active and eat healthy.  - Follow up in one year or sooner if needed - Ambulatory referral to Physical Therapy - Basic metabolic panel; Future - CBC with Differential/Platelet; Future - Hepatic function panel; Future - Lipid panel; Future -  TSH; Future - PSA; Future  2. Pure hypercholesterolemia  - Ambulatory referral to Physical Therapy - Basic metabolic panel; Future - CBC with Differential/Platelet; Future - Hepatic function panel; Future - Lipid panel; Future - TSH; Future - PSA; Future  3. Hypertensive heart disease with heart failure (HCC) - Continue with 25 mg daily Toprol - Ambulatory referral to Physical Therapy - Basic metabolic panel; Future - CBC with Differential/Platelet; Future - Hepatic function panel; Future - Lipid panel; Future - TSH; Future - PSA; Future  4. Gait disturbance  - Ambulatory referral to Physical Therapy  5. Need for vaccination against Streptococcus pneumoniae  - Pneumococcal conjugate vaccine 13-valent   Dorothyann Peng, NP

## 2017-04-30 NOTE — Patient Instructions (Signed)
It was great seeing you today   Please return tomorrow morning for your blood work. No eating after midnight   Someone from physical therapy will call you to schedule your exam   If you need anything, please do not hesitate to call

## 2017-05-01 ENCOUNTER — Other Ambulatory Visit: Payer: Medicare Other

## 2017-05-01 ENCOUNTER — Encounter: Payer: Self-pay | Admitting: Family Medicine

## 2017-05-01 LAB — CBC WITH DIFFERENTIAL/PLATELET
BASOS PCT: 0.9 % (ref 0.0–3.0)
Basophils Absolute: 0 10*3/uL (ref 0.0–0.1)
EOS PCT: 1.6 % (ref 0.0–5.0)
Eosinophils Absolute: 0.1 10*3/uL (ref 0.0–0.7)
HCT: 40.9 % (ref 39.0–52.0)
HEMOGLOBIN: 12.9 g/dL — AB (ref 13.0–17.0)
Lymphocytes Relative: 15.4 % (ref 12.0–46.0)
Lymphs Abs: 0.8 10*3/uL (ref 0.7–4.0)
MCHC: 31.6 g/dL (ref 30.0–36.0)
MCV: 79.4 fl (ref 78.0–100.0)
MONOS PCT: 14 % — AB (ref 3.0–12.0)
Monocytes Absolute: 0.7 10*3/uL (ref 0.1–1.0)
Neutro Abs: 3.3 10*3/uL (ref 1.4–7.7)
Neutrophils Relative %: 68.1 % (ref 43.0–77.0)
Platelets: 167 10*3/uL (ref 150.0–400.0)
RBC: 5.14 Mil/uL (ref 4.22–5.81)
RDW: 18.9 % — AB (ref 11.5–15.5)
WBC: 4.9 10*3/uL (ref 4.0–10.5)

## 2017-05-01 LAB — BASIC METABOLIC PANEL
BUN: 25 mg/dL — AB (ref 6–23)
CALCIUM: 9.4 mg/dL (ref 8.4–10.5)
CO2: 27 mEq/L (ref 19–32)
Chloride: 106 mEq/L (ref 96–112)
Creatinine, Ser: 1.2 mg/dL (ref 0.40–1.50)
GFR: 61.17 mL/min (ref >60.00)
GLUCOSE: 96 mg/dL (ref 70–99)
POTASSIUM: 4.4 meq/L (ref 3.5–5.1)
SODIUM: 141 meq/L (ref 135–145)

## 2017-05-01 LAB — LIPID PANEL
CHOL/HDL RATIO: 2
CHOLESTEROL: 130 mg/dL (ref 0–200)
HDL: 56 mg/dL (ref >39.00)
LDL CALC: 60 mg/dL (ref 0–99)
NonHDL: 74.36
Triglycerides: 70 mg/dL (ref 0.0–149.0)
VLDL: 14 mg/dL (ref 0.0–40.0)

## 2017-05-01 LAB — HEPATIC FUNCTION PANEL
ALBUMIN: 4.2 g/dL (ref 3.5–5.2)
ALK PHOS: 68 U/L (ref 39–117)
ALT: 20 U/L (ref 0–53)
AST: 23 U/L (ref 0–37)
BILIRUBIN TOTAL: 0.6 mg/dL (ref 0.2–1.2)
Bilirubin, Direct: 0.1 mg/dL (ref 0.0–0.3)
Total Protein: 6.6 g/dL (ref 6.0–8.3)

## 2017-05-01 LAB — PSA: PSA: 0.16 ng/mL (ref 0.10–4.00)

## 2017-05-01 LAB — TSH: TSH: 4.54 u[IU]/mL — ABNORMAL HIGH (ref 0.35–4.50)

## 2017-05-01 NOTE — Addendum Note (Signed)
Addended by: Wyvonne Lenz on: 05/01/2017 08:39 AM   Modules accepted: Orders

## 2017-05-13 ENCOUNTER — Encounter: Payer: Self-pay | Admitting: Physician Assistant

## 2017-05-13 ENCOUNTER — Other Ambulatory Visit: Payer: Self-pay

## 2017-05-13 ENCOUNTER — Ambulatory Visit: Payer: Medicare Other | Admitting: Physician Assistant

## 2017-05-13 ENCOUNTER — Ambulatory Visit (HOSPITAL_COMMUNITY): Payer: Medicare Other | Attending: Cardiology

## 2017-05-13 ENCOUNTER — Other Ambulatory Visit: Payer: Self-pay | Admitting: Physician Assistant

## 2017-05-13 VITALS — BP 160/72 | HR 70 | Ht 65.0 in | Wt 141.0 lb

## 2017-05-13 DIAGNOSIS — I10 Essential (primary) hypertension: Secondary | ICD-10-CM

## 2017-05-13 DIAGNOSIS — I083 Combined rheumatic disorders of mitral, aortic and tricuspid valves: Secondary | ICD-10-CM | POA: Insufficient documentation

## 2017-05-13 DIAGNOSIS — Z952 Presence of prosthetic heart valve: Secondary | ICD-10-CM

## 2017-05-13 DIAGNOSIS — I495 Sick sinus syndrome: Secondary | ICD-10-CM | POA: Diagnosis not present

## 2017-05-13 DIAGNOSIS — I480 Paroxysmal atrial fibrillation: Secondary | ICD-10-CM | POA: Diagnosis not present

## 2017-05-13 DIAGNOSIS — I2510 Atherosclerotic heart disease of native coronary artery without angina pectoris: Secondary | ICD-10-CM | POA: Diagnosis not present

## 2017-05-13 DIAGNOSIS — E785 Hyperlipidemia, unspecified: Secondary | ICD-10-CM | POA: Diagnosis not present

## 2017-05-13 DIAGNOSIS — I272 Pulmonary hypertension, unspecified: Secondary | ICD-10-CM | POA: Insufficient documentation

## 2017-05-13 DIAGNOSIS — I519 Heart disease, unspecified: Secondary | ICD-10-CM | POA: Diagnosis not present

## 2017-05-13 DIAGNOSIS — Z48812 Encounter for surgical aftercare following surgery on the circulatory system: Secondary | ICD-10-CM | POA: Insufficient documentation

## 2017-05-13 DIAGNOSIS — I350 Nonrheumatic aortic (valve) stenosis: Secondary | ICD-10-CM | POA: Diagnosis not present

## 2017-05-13 MED ORDER — AMLODIPINE BESYLATE 5 MG PO TABS: 5.0000 mg | ORAL_TABLET | Freq: Every day | ORAL | 2 refills | Status: DC

## 2017-05-13 NOTE — Progress Notes (Addendum)
[facility] [place] [facility]                                       Cardiology Office Note    Date:  05/13/2017   ID:  [name], DOB Aug 24, 1929, MRN [id number]  PCP:  Dorothyann Peng, NP  Cardiologist:  Dr. Meda Coffee / Dr. Lovena Le (EP) / Dr. Burt Knack & Dr. Cyndia Bent (TAVR)  CC: 1 year follow up s/p TAVR  History of Present Illness:  Ryan [name] is a 83 y.o. male with a history of carotid artery disease s/p bilateral CEA, CAD s/p PCI, tachy-brady syndrome s/p PPM, HTN, HLD, prior SVT, OSA, chronic diastolic CHF, paroxysmal atrial fibrillation on Eliquis and severe AS s/p TAVR (06/2016) who presents to clinic for follow up.  Echo in 10/2015 demonstrated mod aortic stenosis with mean gradient 25 mmHg. In 8/17, he developed worsening dyspnea with exertion and fatigue. Cardiac catheterization demonstrated worsening CAD with 70% mid LCx, 80% OM1 and 80% mid RCA stenosis. PCI felt to be high risk. Medical therapy was recommended initially. He then saw Dr. Roxy Manns in surgical consultation. He was felt to have stage D severe symptomatic aortic stenosis. Work up for AVR vs TAVR was initiated.  He was then admitted in 10/17 with unstable angina in the setting of AF with RVR. This was complicated by tachybradycardia syndrome and he underwent pacemaker implantation with Dr. Lovena Le. In 11/17, he underwent successful rotational atherectomy and stenting of the RCA with a 3 x 12 mm Synergy DES on 04/30/16 by Dr Burt Knack.  He ultimately underwent TAVR with a 22mm Edwards Sapien THV via the R TF approach on 06/10/2016 for treatment of severe systematic aortic stenosis. His 1 month echo on 07/04/16 showed EF 50-55%, normally functioning TAVR valve with trivial PVL ( mean gradient 8 mm Hg.)   Today he presents to clinic for follow up. He is doing okay. He continues to have fatigue. Also had cataract surgery and now having a trouble with reading now. He has multiple complaints that  appear to be chronic after reviewing previous notes. No CP. He continues to have shortness of breath that seems to be worse at night. He sleeps on two pillows which seems to help. No PND. No LE edema. He does have some dizziness but no syncope. No blood in stool or urine. He occasionally gets palpitations. He has bone on bone OA in both knees which limits his mobility. He is able to walk 30-45 minutes in the mornings with his wife but he does get very tired after this. He tries to do what he can.   Past Medical History:  Diagnosis Date  . Age-related macular degeneration   . Allergic rhinitis   . Aortic valve stenosis   . Arthritis    "hands, lower back" (04/30/2016)  . Asthma   . Carotid artery obstruction   . Chronic lower back pain   . Coronary arteriosclerosis in native artery   . Diverticulitis of colon   . DJD (degenerative joint disease)   . Dyspnea    "constant but the degree changes"  . Dysthymia   . GERD (gastroesophageal reflux disease)   . History of elevated PSA 05/2009  . History of hiatal hernia   . HLD (hyperlipidemia)   . Hypertension   . Nocturnal hypoxemia   . OSA (obstructive sleep apnea)  intolerant to CPAP  . Paroxysmal supraventricular tachycardia ([place])   . Pneumonia    "when I was a kid"  . Presence of permanent cardiac pacemaker   . Primary malignant neoplasm of bladder ([place])   . Prostate cancer [facility])     Past Surgical History:  Procedure Laterality Date  . APPENDECTOMY    . CARDIAC CATHETERIZATION N/A 01/17/2016   Procedure: Right/Left Heart Cath and Coronary Angiography;  Surgeon: Belva Crome, MD;  Location: [place] [facility];  Service: Cardiovascular;  Laterality: N/A;  . CARDIAC CATHETERIZATION N/A 04/30/2016   Procedure: Coronary/Graft Atherectomy;  Surgeon: Sherren Mocha, MD;  Location: [place] [facility];  Service: Cardiovascular;  Laterality: N/A;  . CAROTID ENDARTERECTOMY Bilateral   . CATARACT EXTRACTION W/ INTRAOCULAR LENS  IMPLANT,  BILATERAL Bilateral   . COLON SURGERY  1981   Meckles Diverticulum with volvulus  . EP IMPLANTABLE DEVICE N/A 03/21/2016   Procedure: Pacemaker Implant;  Surgeon: Evans Lance, MD;  Location: [place] [facility];  Service: Cardiovascular;  Laterality: N/A;  . INGUINAL HERNIA REPAIR Right 2009  . INSERTION PROSTATE RADIATION SEED    . TEE WITHOUT CARDIOVERSION N/A 06/10/2016   Procedure: TRANSESOPHAGEAL ECHOCARDIOGRAM (TEE);  Surgeon: Sherren Mocha, MD;  Location: [place];  Service: Open Heart Surgery;  Laterality: N/A;  . TONSILLECTOMY  ~ 1947  . TRANSCATHETER AORTIC VALVE REPLACEMENT, TRANSFEMORAL N/A 06/10/2016   Procedure: TRANSCATHETER AORTIC VALVE REPLACEMENT, TRANSFEMORAL;  Surgeon: Sherren Mocha, MD;  Location: [place];  Service: Open Heart Surgery;  Laterality: N/A;    Current Medications: Outpatient Medications Prior to Visit  Medication Sig Dispense Refill  . acetaminophen (TYLENOL) 500 MG tablet Take 500 mg by mouth as needed for mild pain.     Marland Kitchen amiodarone (PACERONE) 200 MG tablet Take 200 mg (one tablet) by mouth once daily Mon-Sat.  Do not take on Sunday. 90 tablet 3  . atorvastatin (LIPITOR) 20 MG tablet Take 1 tablet (20 mg total) by mouth daily. 90 tablet 3  . cholecalciferol (VITAMIN D) 1000 units tablet Take 1,000 Units by mouth 2 (two) times a week.     . Coenzyme Q10 (COQ-10 PO) Take 1 capsule by mouth 2 (two) times a week.     . Cyanocobalamin (VITAMIN B 12 PO) Take 1 capsule by mouth daily.    Marland Kitchen ELIQUIS 2.5 MG TABS tablet TAKE 1 TABLET(2.5 MG) BY MOUTH TWICE DAILY 60 tablet 5  . latanoprost (XALATAN) 0.005 % ophthalmic solution Place 1 drop into both eyes at bedtime.    . metoprolol succinate (TOPROL XL) 25 MG 24 hr tablet Take 1 tablet (25 mg total) by mouth daily. 90 tablet 2  . nitroGLYCERIN (NITROSTAT) 0.4 MG SL tablet Place 1 tablet (0.4 mg total) under the tongue every 5 (five) minutes as needed. 25 tablet 3   No facility-administered medications prior to visit.        Allergies:   Ciprofloxacin; Hydrochlorothiazide; Sulfa antibiotics; Ace inhibitors; Losartan; Carvedilol; and Lisinopril   Social History   Socioeconomic History  . Marital status: Married    Spouse name: None  . Number of children: None  . Years of education: None  . Highest education level: None  Social Needs  . Financial resource strain: None  . Food insecurity - worry: None  . Food insecurity - inability: None  . Transportation needs - medical: None  . Transportation needs - non-medical: None  Occupational History  . Occupation: retired  Tobacco Use  .  Smoking status: Never Smoker  . Smokeless tobacco: Never Used  Substance and Sexual Activity  . Alcohol use: No    Alcohol/week: 0.0 oz    Comment: 04/30/2016 "nothing in years"  . Drug use: No  . Sexual activity: No  Other Topics Concern  . None  Social History Narrative   Retired    Three children - One in [place], One in [place], and one in [place].         Family History:  The patient's family history includes Heart disease in his father and mother.      ROS:   Please see the history of present illness.    ROS All other systems reviewed and are negative.   PHYSICAL EXAM:   VS:  BP (!) 160/72   Pulse 70   Ht 5\' 5"  (1.651 m)   Wt 141 lb (64 kg)   SpO2 98%   BMI 23.46 kg/m    GEN: Well nourished, well developed, in no acute distress  HEENT: normal  Neck: no JVD, carotid bruits, or masses Cardiac: RRR; soft SEM @ RUSB. No rubs, or gallops,no edema  Respiratory:  clear to auscultation bilaterally, normal work of breathing GI: soft, nontender, nondistended, + BS MS: no deformity or atrophy  Skin: warm and dry, no rash Neuro:  Alert and Oriented x 3, Strength and sensation are intact Psych: euthymic mood, full affect   Wt Readings from Last 3 Encounters:  05/13/17 141 lb (64 kg)  04/30/17 141 lb (64 kg)  04/14/17 141 lb (64 kg)      Studies/Labs Reviewed:   EKG:  EKG is NOT ordered today.     Recent Labs: 06/11/2016: Magnesium 2.3 05/01/2017: ALT 20; BUN 25; Creatinine, Ser 1.20; Hemoglobin 12.9; Platelets 167.0; Potassium 4.4; Sodium 141; TSH 4.54   Lipid Panel    Component Value Date/Time   CHOL 130 05/01/2017 0827   TRIG 70.0 05/01/2017 0827   HDL 56.00 05/01/2017 0827   CHOLHDL 2 05/01/2017 0827   VLDL 14.0 05/01/2017 0827   LDLCALC 60 05/01/2017 0827    Additional studies/ records that were reviewed today include:  2D ECHO 07/04/16 (1 month s/p TAVR) Study Conclusions - Left ventricle: The cavity size was normal. Wall thickness was   increased in a pattern of mild LVH. Systolic function was normal.   The estimated ejection fraction was in the range of 50% to 55%.   Doppler parameters are consistent with abnormal left ventricular   relaxation (grade 1 diastolic dysfunction). - Aortic valve: Post TAVR with trivial periprosthetic   regurgitation. - Aorta: Post TAVR with stable gradients and only trivial   perivalvular regurgitation. - Mitral valve: There was mild regurgitation. - Left atrium: The atrium was moderately dilated. - Atrial septum: No defect or patent foramen ovale was identified.  ________________  2D ECHO 05/13/17 (1 year s/p TAVR) Study Conclusions: - Left ventricle: The cavity size was normal. Wall thickness was   increased in a pattern of mild LVH. Systolic function was mildly   to moderately reduced. The estimated ejection fraction was in the   range of 40% to 45%. Diffuse hypokinesis. Doppler parameters are   consistent with abnormal left ventricular relaxation (grade 1   diastolic dysfunction). - Ventricular septum: Septal motion showed abnormal function and   dyssynergy. - Aortic valve: A bioprosthesis was present. There was mild   regurgitation. Valve area (VTI): 1.12 cm^2. Valve area (Vmax):   1.1 cm^2. Valve area (Vmean): 1.13 cm^2. -  Mitral valve: There was mild regurgitation. - Left atrium: The atrium was mildly dilated. -  Pulmonary arteries: Systolic pressure was mildly to moderately   increased. PA peak pressure: 44 mm Hg (S). Impressions: - Mild to moderate LV systolic dysfunction; mild diastolic   dysfunction; mild LVH; s/p TAVR with mean gradient 12 mmHg and   mild AI; mild MR; mild TR with mild to moderate pulmonary   hypertension.  ASSESSMENT & PLAN:   Severe AS s/p TAVR: he has NYHA class II symptoms. 2D ECHO today shows moderate LV dysfunction EF 40-45% (which is down from previous 50-55%) and a normally functioning TAVR valve with mild aortic regurgitation; mean gradient 12 mm Hg. He is aware of the need for SBE prophylaxis. He will call us when he arranges upcoming dental work. He will continue regular follow up with Dr. Meda Coffee and Dr. Lovena Le   HTN: BP is very elevated today. 180/80 on my personal recheck. He says it never runs that high at home. With his worsening LV function, I would like to get BP down. I will add amlodipine 5mg  daily and have him seen back in the HTN clinic in ~2 weeks. He will keep a log of his BPs at home.   LV dysfunction: no objective signs of CHF. Continue Toprol XL. He is intolerant to ACEi/ARBs. Start amlodipine as above for better BP control   CAD: s/p PCI in 04/2016. No chest pain. He is not on ASA given need for Eliquis. Continue BB and statin.   PAF: on amiodarone for rhythm control. Continue Eliquis for thromboembolic prophylaxis. He is on 2.5mg  BID. He is >70 years old and weight fluctuates ~ 60kg.   Tachy-brady s/p PPM: followed by Dr. Lovena Le   Medication Adjustments/Labs and Tests Ordered: Current medicines are reviewed at length with the patient today.  Concerns regarding medicines are outlined above.  Medication changes, Labs and Tests ordered today are listed in the Patient Instructions below. Patient Instructions  Medication Instructions:  Start Amlodipine 5mg  daily.   Labwork: None  Testing/Procedures: None  Follow-Up: Your provider recommends that  you schedule a follow-up appointment AS NEEDED with the Structural Heart Team.  Please keep your scheduled appointment with Dr. Meda Coffee. Keep a blood pressure log and bring for Dr. Meda Coffee to review.   Any Other Special Instructions Will Be Listed Below (If Applicable).     If you need a refill on your cardiac medications before your next appointment, please call your pharmacy.      Signed, Angelena Form, PA-C  05/13/2017 6:52 PM    [facility] [address], [place], [place]  [id number] Phone: [phone]; Fax: [phone]

## 2017-05-13 NOTE — Patient Instructions (Signed)
Medication Instructions:  Your provider recommends that you continue on your current medications as directed. Please refer to the Current Medication list given to you today.    Labwork: None  Testing/Procedures: None  Follow-Up: Your provider recommends that you schedule a follow-up appointment AS NEEDED with the Structural Heart Team.  Please keep your scheduled appointment with Dr. Meda Coffee. Keep a blood pressure log and bring for Dr. Meda Coffee to review.   Any Other Special Instructions Will Be Listed Below (If Applicable).     If you need a refill on your cardiac medications before your next appointment, please call your pharmacy.

## 2017-05-14 ENCOUNTER — Telehealth: Payer: Self-pay

## 2017-05-14 NOTE — Telephone Encounter (Signed)
-----   Message from Eileen Stanford, PA-C sent at 05/13/2017  6:50 PM EST ----- Do you mind helping me get an appointment for him in the HTN clinic in 2 weeks. I ended up calling in amlodipine when I saw his echo come back with worsening LV function. Thank you!!  KT

## 2017-05-14 NOTE — Telephone Encounter (Signed)
Mr. Drinkard is returning your call. Please call at [phone]. Thanks

## 2017-05-14 NOTE — Telephone Encounter (Signed)
Called patient to schedule HTN Clinic OV in a couple weeks.  Left message to call back.

## 2017-05-14 NOTE — Telephone Encounter (Signed)
Scheduled patient 1/3 in the HTN Clinic. He will bring BP log with him to review. He was grateful for call and agrees with treatment plan.

## 2017-05-18 ENCOUNTER — Ambulatory Visit: Payer: Medicare Other | Admitting: Physician Assistant

## 2017-05-22 DIAGNOSIS — H2512 Age-related nuclear cataract, left eye: Secondary | ICD-10-CM | POA: Diagnosis not present

## 2017-05-22 DIAGNOSIS — Z961 Presence of intraocular lens: Secondary | ICD-10-CM | POA: Diagnosis not present

## 2017-05-22 DIAGNOSIS — H2511 Age-related nuclear cataract, right eye: Secondary | ICD-10-CM | POA: Diagnosis not present

## 2017-05-28 ENCOUNTER — Telehealth: Payer: Self-pay | Admitting: Cardiology

## 2017-05-28 NOTE — Telephone Encounter (Signed)
Left message for patient to have the pharmacist call Nell Range, PA or Lenice Llamas, RN (whomever patient would like to see) 1/3 and if available best efforts will be made to come see him for a visit.

## 2017-05-28 NOTE — Telephone Encounter (Signed)
New Message    Patient called regarding his appt for 1/3 with the pharmacist, he would like to meet with Valetta Fuller  after he sees the pharmacist, if she will be available.

## 2017-06-04 ENCOUNTER — Ambulatory Visit (INDEPENDENT_AMBULATORY_CARE_PROVIDER_SITE_OTHER): Payer: Medicare Other | Admitting: Pharmacist

## 2017-06-04 ENCOUNTER — Encounter: Payer: Self-pay | Admitting: Pharmacist

## 2017-06-04 VITALS — BP 148/78

## 2017-06-04 DIAGNOSIS — I110 Hypertensive heart disease with heart failure: Secondary | ICD-10-CM | POA: Diagnosis not present

## 2017-06-04 NOTE — Patient Instructions (Addendum)
Return for a follow up appointment in 3-4 weeks as schedule with Dr. Meda Coffee. Call [phone] with questions or concerns with your blood pressure before your visit.   Check your blood pressure at home daily (if able) and keep record of the readings.  Take your BP meds as follows: Start taking amlodipine in the evening. Continue metoprolol succinate in the morning.   Bring all of your meds, your BP cuff and your record of home blood pressures to your next appointment.  Exercise as you're able, try to walk approximately 30 minutes per day.  Keep salt intake to a minimum, especially watch canned and prepared boxed foods.  Eat more fresh fruits and vegetables and fewer canned items.  Avoid eating in fast food restaurants.    HOW TO TAKE YOUR BLOOD PRESSURE: . Rest 5 minutes before taking your blood pressure. .  Don't smoke or drink caffeinated beverages for at least 30 minutes before. . Take your blood pressure before (not after) you eat. . Sit comfortably with your back supported and both feet on the floor (don't cross your legs). . Elevate your arm to heart level on a table or a desk. . Use the proper sized cuff. It should fit smoothly and snugly around your bare upper arm. There should be enough room to slip a fingertip under the cuff. The bottom edge of the cuff should be 1 inch above the crease of the elbow. . Ideally, take 3 measurements at one sitting and record the average.

## 2017-06-04 NOTE — Progress Notes (Signed)
Patient ID: Ryan [name]                 DOB: [birth year]                      MRN: [id number]     HPI: Ryan [name] is a 84 y.o. male patient of Dr. Meda Coffee who presents today for hypertension evaluation. PMH significant for carotid artery disease s/p bilateral CEA, CAD s/p PCI, tachy-brady syndrome s/p PPM, HTN, HLD, prior SVT, OSA, chronic diastolic CHF, paroxysmal atrial fibrillation on Eliquis and severe AS s/p TAVR (06/2016). He was referred to HTN clinic by Nell Range, PA for BP elevated to 180/80 at last visit. He was started on amlodipine 5mg  daily and told to bring log of home pressures.   He presents today for further blood pressure management with his wife. He is using a cane (of note it appears that the cane is a ski pole). He reports that he is dizzy and exhausted since starting amlodipine. He has some SOB. He was standing last night and he felt as if he could/would collapse. He reports he has lost his balance several times when standing. His home blood pressures range 110s-160s/70s-90s mostly 140s/80s. He report his pressure is too low when it runs in 761P systolic.    We had lengthy discussion on blood pressure ranges and what a normal blood pressure level is and why/how pressure are controlled. Ultimately he is concerned about his pressure being too low and causing the fatigue he has been experiencing.    Current HTN meds:  Amlodipine 5mg  daily in the morning Metoprolol succinate 25mg  daily in morning  Previously tried: HCTZ (anaphylaxis), losartan (cough), Acei (cough), carvedilol (cough), lisinopril (cough)  BP goal: <140/90 (given age and risk for fall)  Family History: Heart disease in his father and mother.  Social History: Denies tobacco and alcohol.   Diet: Prepares meals from home. Only uses herbs and no salt. Does not add salt to food. Does well getting vegetables. Eats mostly chicken [place] and fish. Drinks mostly water. He has stopped coffee. Rarely he  will drink some tea.   Exercise: Was an avid exerciser. He now walks every day and uses stationary bike, rower and nordick track at home.   Home BP readings: see above  Wt Readings from Last 3 Encounters:  05/13/17 141 lb (64 kg)  04/30/17 141 lb (64 kg)  04/14/17 141 lb (64 kg)   BP Readings from Last 3 Encounters:  06/04/17 (!) 148/78  05/13/17 (!) 160/72  04/30/17 140/82   Pulse Readings from Last 3 Encounters:  05/13/17 70  03/27/17 68  03/26/17 80    Renal function: CrCl cannot be calculated (Patient's most recent lab result is older than the maximum 21 days allowed.).  Past Medical History:  Diagnosis Date  . Age-related macular degeneration   . Allergic rhinitis   . Aortic valve stenosis   . Arthritis    "hands, lower back" (04/30/2016)  . Asthma   . Carotid artery obstruction   . Chronic lower back pain   . Coronary arteriosclerosis in native artery   . Diverticulitis of colon   . DJD (degenerative joint disease)   . Dyspnea    "constant but the degree changes"  . Dysthymia   . GERD (gastroesophageal reflux disease)   . History of elevated PSA 05/2009  . History of hiatal hernia   . HLD (hyperlipidemia)   . Hypertension   .  Nocturnal hypoxemia   . OSA (obstructive sleep apnea)    intolerant to CPAP  . Paroxysmal supraventricular tachycardia ([place])   . Pneumonia    "when I was a kid"  . Presence of permanent cardiac pacemaker   . Primary malignant neoplasm of bladder ([place])   . Prostate cancer [facility])     Current Outpatient Medications on File Prior to Visit  Medication Sig Dispense Refill  . acetaminophen (TYLENOL) 500 MG tablet Take 500 mg by mouth as needed for mild pain.     Marland Kitchen amiodarone (PACERONE) 200 MG tablet Take 200 mg (one tablet) by mouth once daily Mon-Sat.  Do not take on Sunday. 90 tablet 3  . amLODipine (NORVASC) 5 MG tablet Take 1 tablet (5 mg total) by mouth daily. 30 tablet 2  . atorvastatin (LIPITOR) 20 MG tablet Take 1 tablet (20 mg  total) by mouth daily. 90 tablet 3  . cholecalciferol (VITAMIN D) 1000 units tablet Take 1,000 Units by mouth 2 (two) times a week.     . Coenzyme Q10 (COQ-10 PO) Take 1 capsule by mouth 2 (two) times a week.     . Cyanocobalamin (VITAMIN B 12 PO) Take 1 capsule by mouth daily.    Marland Kitchen ELIQUIS 2.5 MG TABS tablet TAKE 1 TABLET(2.5 MG) BY MOUTH TWICE DAILY 60 tablet 5  . latanoprost (XALATAN) 0.005 % ophthalmic solution Place 1 drop into both eyes at bedtime.    . metoprolol succinate (TOPROL XL) 25 MG 24 hr tablet Take 1 tablet (25 mg total) by mouth daily. 90 tablet 2  . nitroGLYCERIN (NITROSTAT) 0.4 MG SL tablet Place 1 tablet (0.4 mg total) under the tongue every 5 (five) minutes as needed. (Patient not taking: Reported on 06/04/2017) 25 tablet 3   No current facility-administered medications on file prior to visit.     Allergies  Allergen Reactions  . Ciprofloxacin Anaphylaxis  . Hydrochlorothiazide Anaphylaxis  . Sulfa Antibiotics Anaphylaxis  . Ace Inhibitors Cough  . Losartan Cough  . Carvedilol Cough    Pt states it "causes him too cough."  . Lisinopril Cough    Pt reports worsening cough and "feeling wheezy."    Blood pressure (!) 148/78.   Assessment/Plan: Hypertension: BP today above less strict goal <130/80. Orthostatics were negative today.  Will space his medications (move amlodipine to evening dosing) to hopefully help with the perceived lows that he is experiencing and keep pressures more consistently in 130s and 140s. Advised he call if lethargy worsens or if has additional issues with pressures. He will follow up with Dr. Meda Coffee in 3 weeks as scheduled and then can be scheduled in HTN clinic if needed for additional follow up or medication titration.    Thank you, Lelan Pons. Patterson Hammersmith, [facility]  06/04/2017 4:36 PM

## 2017-06-09 DIAGNOSIS — H401222 Low-tension glaucoma, left eye, moderate stage: Secondary | ICD-10-CM | POA: Diagnosis not present

## 2017-06-09 DIAGNOSIS — H353131 Nonexudative age-related macular degeneration, bilateral, early dry stage: Secondary | ICD-10-CM | POA: Diagnosis not present

## 2017-06-09 DIAGNOSIS — H401213 Low-tension glaucoma, right eye, severe stage: Secondary | ICD-10-CM | POA: Diagnosis not present

## 2017-06-09 DIAGNOSIS — H5213 Myopia, bilateral: Secondary | ICD-10-CM | POA: Diagnosis not present

## 2017-06-23 ENCOUNTER — Encounter: Payer: Self-pay | Admitting: Thoracic Surgery (Cardiothoracic Vascular Surgery)

## 2017-06-29 ENCOUNTER — Encounter: Payer: Medicare Other | Admitting: *Deleted

## 2017-06-29 ENCOUNTER — Encounter: Payer: Self-pay | Admitting: Cardiology

## 2017-06-29 ENCOUNTER — Ambulatory Visit: Payer: Medicare Other | Admitting: Cardiology

## 2017-06-29 ENCOUNTER — Telehealth: Payer: Self-pay | Admitting: Cardiology

## 2017-06-29 VITALS — BP 146/70 | HR 85 | Ht 65.0 in | Wt 144.0 lb

## 2017-06-29 DIAGNOSIS — I2510 Atherosclerotic heart disease of native coronary artery without angina pectoris: Secondary | ICD-10-CM

## 2017-06-29 DIAGNOSIS — R0600 Dyspnea, unspecified: Secondary | ICD-10-CM

## 2017-06-29 DIAGNOSIS — I480 Paroxysmal atrial fibrillation: Secondary | ICD-10-CM

## 2017-06-29 DIAGNOSIS — R05 Cough: Secondary | ICD-10-CM | POA: Diagnosis not present

## 2017-06-29 DIAGNOSIS — R0609 Other forms of dyspnea: Secondary | ICD-10-CM | POA: Diagnosis not present

## 2017-06-29 DIAGNOSIS — Z7901 Long term (current) use of anticoagulants: Secondary | ICD-10-CM

## 2017-06-29 DIAGNOSIS — I10 Essential (primary) hypertension: Secondary | ICD-10-CM | POA: Diagnosis not present

## 2017-06-29 DIAGNOSIS — Z79899 Other long term (current) drug therapy: Secondary | ICD-10-CM | POA: Diagnosis not present

## 2017-06-29 DIAGNOSIS — E782 Mixed hyperlipidemia: Secondary | ICD-10-CM

## 2017-06-29 DIAGNOSIS — R059 Cough, unspecified: Secondary | ICD-10-CM

## 2017-06-29 DIAGNOSIS — Z952 Presence of prosthetic heart valve: Secondary | ICD-10-CM | POA: Diagnosis not present

## 2017-06-29 LAB — TSH: TSH: 5.35 u[IU]/mL — ABNORMAL HIGH (ref 0.450–4.500)

## 2017-06-29 MED ORDER — ALBUTEROL SULFATE HFA 108 (90 BASE) MCG/ACT IN AERS: 2.0000 | INHALATION_SPRAY | Freq: Four times a day (QID) | RESPIRATORY_TRACT | 2 refills | Status: DC | PRN

## 2017-06-29 MED ORDER — NEBIVOLOL HCL 2.5 MG PO TABS: 2.5000 mg | ORAL_TABLET | Freq: Every day | ORAL | 1 refills | Status: DC

## 2017-06-29 NOTE — Progress Notes (Signed)
Cardiology Office Note    Date:  06/29/2017   ID:  Ryan [name], DOB 03/15/1930, MRN [id number]  PCP:  Dorothyann Peng, NP  Cardiologist:   Ena Dawley, MD   Chief complain: 3 months follow-up  History of Present Illness:  Ryan [name] is a 84 y.o. male with a hx of carotid artery disease s/p bilateral CEA, CAD, aortic stenosis, HTN, HL, prior SVT, OSA, diastolic CHF, paroxysmal atrial fibrillation.  Echo in 5/17 demonstrated mod aortic stenosis with mean gradient 25 mmHg. In 8/17, he developed worsening dyspnea with exertion and fatigue. Cardiac catheterization demonstrated worsening CAD with 70% mid LCx, 80% OM1 and 80% mid RCA stenosis. PCI would be high risk. Medical therapy was recommended initially. He then saw Dr. Roxy Manns in surgical consultation. He was felt to have stage D severe symptomatic aortic stenosis. Work up for AVR vs TAVR was initiated.  He was then admitted in 10/17 with unstable angina in the setting of AF with RVR. This was complicated by tachybradycardia syndrome and he underwent pacemaker implantation with Dr. Lovena Le.   05/22/2016 - He returns for Cardiology follow up. In 11/17, he underwent successful rotational atherectomy and stenting of the RCA with a 3 x 12 mm Synergy DES on 04/30/16 by Dr Burt Knack. He was discharged on aspirin, Plavix and apixaban with plans to discontinue aspirin after 30 days. Apixaban to be stopped 5 days prior to TAVR. TAVR is scheduled for 06/11/2015. He states that he continues to have dyspnea on minimal exertion, dizziness with exertion. No syncope. He continues to have daily epistaxis.  07/23/2016 - 6 weeks post TAVR, he is procedure was uncomplicated, his postprocedural echo shows LVEF 50-55% with no central AI and only trivial paravalvular leak. The patient has been doing well, he started to walk about 20-30 minutes a day, he is going to start cardiac rehabilitation on March 1. He denies any palpitations, no dizziness, no syncope, no  orthopnea, no lower extremity edema. He's complaining is profound fatigue, lack of energy and easy bruising.  10/23/2016 - 3 months follow-up, the patient states that he continues to feel significantly tired, he exercises every day but has to take break and then has to take a nap after exercise. He is to exercise every day but his hospital course and procedure prolonged course and he'll lost his conditioning. He denies any chest pain shortness of breath, no palpitation no lower extremity edema. He has history of wheezing on a daily basis. He continues to bruise easily but nosebleeds have resolved.  11/24/2016 - the patient is coming after 1 months, he feels overall much stronger, he exercises in and out of the pool every day, he continues to experience post exertional fatigue that is slightly improved from a month ago. He has also been experiencing problems with balance. He is asking if we can discontinue helical S as he has easy bruising but otherwise no bleeding.  01/05/17 - 6 week follow up, we were contacted by Dr Loletha Carrow, his son in law in July that his father had an episode of presyncope, he was seen by Dr Sallyanne Kuster on 12/05/16, it was concluded that it was sec to orthostatic hypotension.  We interrogated his PM and found that he had an episode of a-fib with RVR, we increased cardizem CD to 180 mg po daily. He was instructed to stay hydrated and monitor BP for hypotension. He was explained that if he has more episodes, we might need to restart amiodarone (+ metoprolol )  again. The interrogation also showed A\a very brief episode of ectopic atrial tachycardia, possible atrial flutter with 2-1 AV conduction, but preceded his symptoms by at least 15 minutes and lasted for less than 3 seconds. Unlikely to have any relationship with this complaint. The patient also states that he has been profoundly tired with minimal activity and has chest pressure on exertion. This feels like before he got his stent placed. He  also has episodes of dizziness every day. We have gotten interrogation of his pacer today in the clinic he has had 9 episodes of rapid atrial fibrillation in the last months but the last episode was on July 31. He brings a diary and he had more episodes of dizziness and weakness on August 2nd, 5th, 6th. No syncope.  03/26/2017 -patient is coming after 2 months, he is accompanied by his wife both seem to be doing well. He just underwent cataract surgery with significant improvement in his vision and is scheduled to undergo cataract surgery next week. He continues to exercise, he walks 5 times a week with a walker for half an hour in the [facility]. He denies any falls he has occasional dizziness but no syncope. Denies palpitations. He has completed his amiodarone load and now is on 200 mg daily. He would prefer to take metoprolol just once daily. He has no side effects from his medications. He denies any chest pain.  06/29/2017 - patient is coming after 3 months, he continues to feel profoundly tired, now needs to take several naps during the day, when he exercises on a treadmill or running machine he actually feels better, but then has episodes when he just needs to lay down because he suddenly gets really tired. He denies any palpitations or syncope. He has also noticed wheezing and coughing. He has lifelong asthma but it seems to be worse right now.  Past Medical History:  Diagnosis Date  . Age-related macular degeneration   . Allergic rhinitis   . Aortic valve stenosis   . Arthritis    "hands, lower back" (04/30/2016)  . Asthma   . Carotid artery obstruction   . Chronic lower back pain   . Coronary arteriosclerosis in native artery   . Diverticulitis of colon   . DJD (degenerative joint disease)   . Dyspnea    "constant but the degree changes"  . Dysthymia   . GERD (gastroesophageal reflux disease)   . History of elevated PSA 05/2009  . History of hiatal hernia   . HLD (hyperlipidemia)    . Hypertension   . Nocturnal hypoxemia   . OSA (obstructive sleep apnea)    intolerant to CPAP  . Paroxysmal supraventricular tachycardia ([place])   . Pneumonia    "when I was a kid"  . Presence of permanent cardiac pacemaker   . Primary malignant neoplasm of bladder ([place])   . Prostate cancer [facility])     Past Surgical History:  Procedure Laterality Date  . APPENDECTOMY    . CARDIAC CATHETERIZATION N/A 01/17/2016   Procedure: Right/Left [name] Cath and Coronary Angiography;  Surgeon: Belva Crome, MD;  Location: [place] [facility];  Service: Cardiovascular;  Laterality: N/A;  . CARDIAC CATHETERIZATION N/A 04/30/2016   Procedure: Coronary/Graft Atherectomy;  Surgeon: Sherren Mocha, MD;  Location: [place] [facility];  Service: Cardiovascular;  Laterality: N/A;  . CAROTID ENDARTERECTOMY Bilateral   . CATARACT EXTRACTION W/ INTRAOCULAR LENS  IMPLANT, BILATERAL Bilateral   . COLON SURGERY  1981   [place]  Diverticulum with volvulus  . EP IMPLANTABLE DEVICE N/A 03/21/2016   Procedure: Pacemaker Implant;  Surgeon: Evans Lance, MD;  Location: [place] [facility];  Service: Cardiovascular;  Laterality: N/A;  . INGUINAL HERNIA REPAIR Right 2009  . INSERTION PROSTATE RADIATION SEED    . TEE WITHOUT CARDIOVERSION N/A 06/10/2016   Procedure: TRANSESOPHAGEAL ECHOCARDIOGRAM (TEE);  Surgeon: Sherren Mocha, MD;  Location: [place];  Service: Open [name] Surgery;  Laterality: N/A;  . TONSILLECTOMY  ~ 1947  . TRANSCATHETER AORTIC VALVE REPLACEMENT, TRANSFEMORAL N/A 06/10/2016   Procedure: TRANSCATHETER AORTIC VALVE REPLACEMENT, TRANSFEMORAL;  Surgeon: Sherren Mocha, MD;  Location: [place];  Service: Open [name] Surgery;  Laterality: N/A;   Current Medications: Outpatient Medications Prior to Visit  Medication Sig Dispense Refill  . acetaminophen (TYLENOL) 500 MG tablet Take 500 mg by mouth as needed for mild pain.     Marland Kitchen amiodarone (PACERONE) 200 MG tablet Take 200 mg (one tablet) by mouth once daily Mon-Sat.   Do not take on Sunday. 90 tablet 3  . amLODipine (NORVASC) 5 MG tablet Take 1 tablet (5 mg total) by mouth daily. 30 tablet 2  . atorvastatin (LIPITOR) 20 MG tablet Take 1 tablet (20 mg total) by mouth daily. 90 tablet 3  . cholecalciferol (VITAMIN D) 1000 units tablet Take 1,000 Units by mouth 2 (two) times a week.     . Coenzyme Q10 (COQ-10 PO) Take 1 capsule by mouth 2 (two) times a week.     . Cyanocobalamin (VITAMIN B 12 PO) Take 1 capsule by mouth daily.    Marland Kitchen ELIQUIS 2.5 MG TABS tablet TAKE 1 TABLET(2.5 MG) BY MOUTH TWICE DAILY 60 tablet 5  . latanoprost (XALATAN) 0.005 % ophthalmic solution Place 1 drop into both eyes at bedtime.    . nitroGLYCERIN (NITROSTAT) 0.4 MG SL tablet Place 1 tablet (0.4 mg total) under the tongue every 5 (five) minutes as needed. 25 tablet 3  . metoprolol succinate (TOPROL XL) 25 MG 24 hr tablet Take 1 tablet (25 mg total) by mouth daily. 90 tablet 2   No facility-administered medications prior to visit.      Allergies:   Ciprofloxacin; Hydrochlorothiazide; Sulfa antibiotics; Ace inhibitors; Losartan; Carvedilol; and Lisinopril   Social History   Socioeconomic History  . Marital status: Married    Spouse name: None  . Number of children: None  . Years of education: None  . Highest education level: None  Social Needs  . Financial resource strain: None  . Food insecurity - worry: None  . Food insecurity - inability: None  . Transportation needs - medical: None  . Transportation needs - non-medical: None  Occupational History  . Occupation: retired  Tobacco Use  . Smoking status: Never Smoker  . Smokeless tobacco: Never Used  Substance and Sexual Activity  . Alcohol use: No    Alcohol/week: 0.0 oz    Comment: 04/30/2016 "nothing in years"  . Drug use: No  . Sexual activity: No  Other Topics Concern  . None  Social History Narrative   Retired    Three children - One in [place], One in [place], and one in [place].        Family History:  The  patient's family history includes [name] disease in his father and mother.   ROS:   Please see the history of present illness.    ROS All other systems reviewed and are negative.  PHYSICAL EXAM:   VS:  BP (!) 146/70  Pulse 85   Ht 5\' 5"  (1.651 m)   Wt 144 lb (65.3 kg)   SpO2 97%   BMI 23.96 kg/m    GEN: Well nourished, well developed, in no acute distress  HEENT: normal  Neck: no JVD, carotid bruits, or masses Cardiac: RRR; 2/6 systolic murmurs, rubs, or gallops,no edema  Respiratory:  clear to auscultation bilaterally, normal work of breathing GI: soft, nontender, nondistended, + BS MS: no deformity or atrophy  Skin: warm and dry, no rash Neuro:  Alert and Oriented x 3, Strength and sensation are intact Psych: euthymic mood, full affect  Wt Readings from Last 3 Encounters:  06/29/17 144 lb (65.3 kg)  05/13/17 141 lb (64 kg)  04/30/17 141 lb (64 kg)    Studies/Labs Reviewed:   EKG:  Was personally reviewed and shows HL paced rhythm, left bundle branch block,  Recent Labs: 05/01/2017: ALT 20; BUN 25; Creatinine, Ser 1.20; Hemoglobin 12.9; Platelets 167.0; Potassium 4.4; Sodium 141; TSH 4.54   Lipid Panel    Component Value Date/Time   CHOL 130 05/01/2017 0827   TRIG 70.0 05/01/2017 0827   HDL 56.00 05/01/2017 0827   CHOLHDL 2 05/01/2017 0827   VLDL 14.0 05/01/2017 0827   LDLCALC 60 05/01/2017 0827    Additional studies/ records that were reviewed today include:   - Left ventricle: The cavity size was normal. Wall thickness was   increased in a pattern of mild LVH. Systolic function was normal.   The estimated ejection fraction was in the range of 50% to 55%.   Doppler parameters are consistent with abnormal left ventricular   relaxation (grade 1 diastolic dysfunction). - Aortic valve: Post TAVR with trivial periprosthetic   regurgitation. - Aorta: Post TAVR with stable gradients and only trivial   perivalvular regurgitation. - Mitral valve: There was mild  regurgitation. - Left atrium: The atrium was moderately dilated. - Atrial septum: No defect or patent foramen ovale was identified.   ASSESSMENT:    1. Coronary artery disease involving native coronary artery of native [name] without angina pectoris   2. Long term current use of amiodarone   3. Cough   4. DOE (dyspnea on exertion)      PLAN:  In order of problems listed above:  1. Fatigue, cough - we will check PFTs, he had moderate diffusion defect with pulmonary vascular disease on PFTs in 2017, he is now on long-term amiodarone and we will recheck. We will also refer to pulmonology, and check TSH as it was borderline 1 month ago. We will switch metoprolol to by Bystolic 2.5 mg daily.  2. Hypertension - amlodipine was added to his regimen a months ago, we'll switch metoprolol to by [employer].  3. CAD, he underwent successful rotational atherectomy and stenting of the RCA with a 3 x 12 mm Synergy DES on 04/30/16 by Dr Burt Knack. Residual disease will be treated medically (Mid Cx to Dist Cx lesion, 70% stenosed. The lesion is type C. The lesion is calcified). Because of shortness of breath he underwent a pharmacologic nuclear stress test that was negative for ischemia. He continues to exercise. He is not on aspirin as he is taking Eliquis.   4. Paroxysmal atrial fibrillation with rapid ventricular response - he has completed amiodarone load, we'll recheck PFTs, if abnormal we will send him to the A. fib clinic for possibly switching to a different regimen. Continue Eliquis 2.5 mg by mouth twice a day, there is no bleeding. Hemoglobin 12.9 in 04/2017.  5. Aortic stenosis -  status post TAVR on 06/11/2015, with great results normal transaortic gradients 12 mmHg in 122018 and trivial paravalvular leak only.   6. Chronic diastolic CHF - Volume appears stable.   7. HL - Continue statin. Decrease dose of Lipitor to 20 mg po daily as he is experiencing memory problems.  8. PPM - FU with EP as  planned. Followed by Dr. Lovena Le.  Medication Adjustments/Labs and Tests Ordered: Current medicines are reviewed at length with the patient today.  Concerns regarding medicines are outlined above.  Medication changes, Labs and Tests ordered today are listed in the Patient Instructions below. There are no Patient Instructions on file for this visit.   Signed, Ena Dawley, MD  06/29/2017 9:50 AM    [address], [place], [place]  [address] Phone: [phone]; Fax: [phone]

## 2017-06-29 NOTE — Patient Instructions (Signed)
Medication Instructions:   STOP TAKING METOPROLOL (TOPROL XL) NOW  START TAKING BYSTOLIC 2.5 MG ONCE DAILY  DR NELSON HAS PRESCRIBED FOR YOU TO USE AN  ALBUTEROL INHALER  AS NEEDED--FOLLOW THE INSTRUCTIONS ON THE INHALER CAREFULLY.    Labwork:  TODAY--TSH    Testing/Procedures:  Your physician has recommended that you have a pulmonary function test. Pulmonary Function Tests are a group of tests that measure how well air moves in and out of your lungs.    You have been referred to PULMONOLOGY      Follow-Up:  2 [place]       If you need a refill on your cardiac medications before your next appointment, please call your pharmacy.

## 2017-06-29 NOTE — Telephone Encounter (Signed)
Spoke with pt and reminded pt of remote transmission that is due today. Pt verbalized understanding.   

## 2017-06-30 ENCOUNTER — Telehealth: Payer: Self-pay | Admitting: *Deleted

## 2017-06-30 DIAGNOSIS — R7989 Other specified abnormal findings of blood chemistry: Secondary | ICD-10-CM

## 2017-06-30 HISTORY — DX: Other specified abnormal findings of blood chemistry: R79.89

## 2017-06-30 MED ORDER — LEVOTHYROXINE SODIUM 25 MCG PO TABS: 12.5000 ug | ORAL_TABLET | Freq: Every day | ORAL | 1 refills | Status: DC

## 2017-06-30 NOTE — Telephone Encounter (Signed)
-----   Message from Dorothy Spark, MD sent at 06/29/2017  5:23 PM EST ----- Abnormal TSH, start levothyroxine 12.5 mcg po daily and repeat TSH in 4-6 weeks.

## 2017-06-30 NOTE — Telephone Encounter (Signed)
Spoke with the pt and informed him that per Dr Meda Coffee, he had abnormal TSH, and she recommends that he start taking levothyroxine 12.5 mcg po daily before breakfast, and repeat TSH in 4-6 weeks.  Confirmed the pharmacy of choice with the pt.  Advised the pt that levothyroxine comes in 25 mcg, so he will need to 1/2 this tablet to get the equivalent of 12.5 mcg po daily.  Informed the pt that his pharmacy may 1/2 this tab for him, but incase they don't, he will need to.  Scheduled the pt a lab appt for 08/11/17 to repeat a TSH.  Pt verbalized understanding and agrees with this plan.

## 2017-07-01 DIAGNOSIS — H18003 Unspecified corneal deposit, bilateral: Secondary | ICD-10-CM | POA: Diagnosis not present

## 2017-07-01 DIAGNOSIS — H401222 Low-tension glaucoma, left eye, moderate stage: Secondary | ICD-10-CM | POA: Diagnosis not present

## 2017-07-01 DIAGNOSIS — H401213 Low-tension glaucoma, right eye, severe stage: Secondary | ICD-10-CM | POA: Diagnosis not present

## 2017-07-01 DIAGNOSIS — H353131 Nonexudative age-related macular degeneration, bilateral, early dry stage: Secondary | ICD-10-CM | POA: Diagnosis not present

## 2017-07-03 ENCOUNTER — Encounter: Payer: Self-pay | Admitting: Cardiology

## 2017-07-03 NOTE — Progress Notes (Signed)
Letter  

## 2017-07-07 ENCOUNTER — Ambulatory Visit (INDEPENDENT_AMBULATORY_CARE_PROVIDER_SITE_OTHER): Payer: Medicare Other | Admitting: Internal Medicine

## 2017-07-07 ENCOUNTER — Institutional Professional Consult (permissible substitution): Payer: Medicare Other | Admitting: Emergency Medicine

## 2017-07-07 DIAGNOSIS — R059 Cough, unspecified: Secondary | ICD-10-CM

## 2017-07-07 DIAGNOSIS — R0609 Other forms of dyspnea: Secondary | ICD-10-CM

## 2017-07-07 DIAGNOSIS — Z79899 Other long term (current) drug therapy: Secondary | ICD-10-CM

## 2017-07-07 DIAGNOSIS — R05 Cough: Secondary | ICD-10-CM

## 2017-07-07 DIAGNOSIS — R0600 Dyspnea, unspecified: Secondary | ICD-10-CM

## 2017-07-07 LAB — PULMONARY FUNCTION TEST
DL/VA % pred: 60 %
DL/VA: 2.63 ml/min/mmHg/L
DLCO unc % pred: 50 %
DLCO unc: 14.18 ml/min/mmHg
FEF 25-75 Post: 1.81 L/sec
FEF 25-75 Pre: 1.83 L/sec
FEF2575-%Change-Post: 0 %
FEF2575-%Pred-Post: 125 %
FEF2575-%Pred-Pre: 126 %
FEV1-%Change-Post: 0 %
FEV1-%Pred-Post: 108 %
FEV1-%Pred-Pre: 108 %
FEV1-Post: 2.48 L
FEV1-Pre: 2.46 L
FEV1FVC-%Change-Post: 3 %
FEV1FVC-%Pred-Pre: 105 %
FEV6-%Change-Post: -1 %
FEV6-%Pred-Post: 106 %
FEV6-%Pred-Pre: 108 %
FEV6-Post: 3.24 L
FEV6-Pre: 3.3 L
FEV6FVC-%Change-Post: 1 %
FEV6FVC-%Pred-Post: 108 %
FEV6FVC-%Pred-Pre: 107 %
FVC-%Change-Post: -3 %
FVC-%Pred-Post: 97 %
FVC-%Pred-Pre: 100 %
FVC-Post: 3.24 L
FVC-Pre: 3.34 L
Post FEV1/FVC ratio: 77 %
Post FEV6/FVC ratio: 100 %
Pre FEV1/FVC ratio: 74 %
Pre FEV6/FVC Ratio: 99 %
RV % pred: 101 %
RV: 2.65 L
TLC % pred: 103 %
TLC: 6.7 L

## 2017-07-07 NOTE — Progress Notes (Signed)
PFT completed today 07/07/17 per Ena Dawley, MD

## 2017-07-10 ENCOUNTER — Telehealth: Payer: Self-pay

## 2017-07-10 ENCOUNTER — Telehealth: Payer: Self-pay | Admitting: Cardiology

## 2017-07-10 NOTE — Telephone Encounter (Signed)
Follow Up: ° ° ° ° °Pt said he received a letter. °

## 2017-07-10 NOTE — Telephone Encounter (Signed)
Transmission received.

## 2017-07-10 NOTE — Telephone Encounter (Signed)
Pt to send in transmission

## 2017-07-10 NOTE — Telephone Encounter (Signed)
**Note De-Identified  Obfuscation** I have done a Bystolic PA through covermymeds per request from [employer].

## 2017-07-13 ENCOUNTER — Ambulatory Visit (INDEPENDENT_AMBULATORY_CARE_PROVIDER_SITE_OTHER): Payer: Medicare Other | Admitting: *Deleted

## 2017-07-13 DIAGNOSIS — I480 Paroxysmal atrial fibrillation: Secondary | ICD-10-CM

## 2017-07-13 NOTE — Progress Notes (Signed)
Remote pacemaker transmission.   

## 2017-07-15 ENCOUNTER — Encounter: Payer: Self-pay | Admitting: Cardiology

## 2017-07-15 ENCOUNTER — Telehealth: Payer: Self-pay | Admitting: Cardiology

## 2017-07-15 MED ORDER — METOPROLOL SUCCINATE ER 25 MG PO TB24: 25.0000 mg | ORAL_TABLET | Freq: Every day | ORAL | 11 refills | Status: DC

## 2017-07-15 NOTE — Telephone Encounter (Signed)
Patient calling complaining of facial swelling, weakness, SOB, and dizziness. Patient stated his BP is fine, but his HR has been wacky. Patient stated this symptoms started after taking bystolic. Patient was started on bystolic 2.5 mg daily and metoprolol was discontinued at office visit on 06/29/17. Instructed patient to hold his bystolic until Dr. Meda Coffee is consulted. Will forward to Dr. Meda Coffee for advisement.

## 2017-07-15 NOTE — Telephone Encounter (Signed)
New message ° °Pt verbalized that he is calling for the RN °

## 2017-07-15 NOTE — Telephone Encounter (Signed)
Please switch Bystolic back to toprol XL 25 mg po daily.

## 2017-07-15 NOTE — Telephone Encounter (Signed)
Instructed patient to STOP BYSTOLIC and RESTART TOPROL 25 mg daily. Patient will continue to monitor symptoms and call if they do not improve.

## 2017-07-20 LAB — CUP PACEART REMOTE DEVICE CHECK
Battery Remaining Longevity: 140 mo
Brady Statistic AS VP Percent: 0 %
Brady Statistic AS VS Percent: 3 %
Implantable Lead Implant Date: 20171020
Implantable Lead Implant Date: 20171020
Implantable Lead Location: 753860
Implantable Pulse Generator Implant Date: 20171020
Lead Channel Impedance Value: 684 Ohm
Lead Channel Pacing Threshold Amplitude: 0.75 V
Lead Channel Pacing Threshold Pulse Width: 0.4 ms
Lead Channel Pacing Threshold Pulse Width: 0.4 ms
Lead Channel Setting Pacing Amplitude: 2 V
Lead Channel Setting Sensing Sensitivity: 5.6 mV
MDC IDC LEAD LOCATION: 753859
MDC IDC MSMT BATTERY IMPEDANCE: 138 Ohm
MDC IDC MSMT BATTERY VOLTAGE: 2.8 V
MDC IDC MSMT LEADCHNL RA IMPEDANCE VALUE: 433 Ohm
MDC IDC MSMT LEADCHNL RA PACING THRESHOLD AMPLITUDE: 0.5 V
MDC IDC SESS DTM: 20190208211925
MDC IDC SET LEADCHNL RV PACING AMPLITUDE: 2.5 V
MDC IDC SET LEADCHNL RV PACING PULSEWIDTH: 0.4 ms
MDC IDC STAT BRADY AP VP PERCENT: 0 %
MDC IDC STAT BRADY AP VS PERCENT: 96 %

## 2017-07-21 DIAGNOSIS — L821 Other seborrheic keratosis: Secondary | ICD-10-CM | POA: Diagnosis not present

## 2017-07-21 DIAGNOSIS — D044 Carcinoma in situ of skin of scalp and neck: Secondary | ICD-10-CM | POA: Diagnosis not present

## 2017-07-21 DIAGNOSIS — Z85828 Personal history of other malignant neoplasm of skin: Secondary | ICD-10-CM | POA: Diagnosis not present

## 2017-07-21 DIAGNOSIS — D225 Melanocytic nevi of trunk: Secondary | ICD-10-CM | POA: Diagnosis not present

## 2017-07-21 DIAGNOSIS — D0439 Carcinoma in situ of skin of other parts of face: Secondary | ICD-10-CM | POA: Diagnosis not present

## 2017-07-21 DIAGNOSIS — L570 Actinic keratosis: Secondary | ICD-10-CM | POA: Diagnosis not present

## 2017-07-31 DIAGNOSIS — H353131 Nonexudative age-related macular degeneration, bilateral, early dry stage: Secondary | ICD-10-CM | POA: Diagnosis not present

## 2017-07-31 DIAGNOSIS — H401213 Low-tension glaucoma, right eye, severe stage: Secondary | ICD-10-CM | POA: Diagnosis not present

## 2017-07-31 DIAGNOSIS — H401222 Low-tension glaucoma, left eye, moderate stage: Secondary | ICD-10-CM | POA: Diagnosis not present

## 2017-07-31 DIAGNOSIS — H18003 Unspecified corneal deposit, bilateral: Secondary | ICD-10-CM | POA: Diagnosis not present

## 2017-08-03 ENCOUNTER — Encounter: Payer: Self-pay | Admitting: Emergency Medicine

## 2017-08-03 ENCOUNTER — Ambulatory Visit: Payer: Medicare Other | Admitting: Emergency Medicine

## 2017-08-03 VITALS — BP 132/76 | HR 89 | Ht 66.5 in | Wt 144.0 lb

## 2017-08-03 DIAGNOSIS — R0600 Dyspnea, unspecified: Secondary | ICD-10-CM

## 2017-08-03 DIAGNOSIS — R0609 Other forms of dyspnea: Secondary | ICD-10-CM

## 2017-08-03 DIAGNOSIS — J849 Interstitial pulmonary disease, unspecified: Secondary | ICD-10-CM | POA: Diagnosis not present

## 2017-08-03 DIAGNOSIS — I272 Pulmonary hypertension, unspecified: Secondary | ICD-10-CM

## 2017-08-03 NOTE — Patient Instructions (Addendum)
We will perform a walking oximetry on room air today. We will arrange for a CT scan of the chest. We will arrange for a VQ scan. Please try using your albuterol 2 puffs 10-15 minutes prior to exercise.  I'd like to see whether this medication makes your exercise routine easier, fatigue less bothersome. Continue your levothyroxine as ordered and follow-up thyroid labs as planned by Dr. Meda Coffee. Follow up with Dr. Lamonte Sakai after all of your testing is completed.

## 2017-08-03 NOTE — Progress Notes (Signed)
Subjective:    Patient ID: Ryan [name], male    DOB: 01/14/1930, 84 y.o.   MRN: [id number]  HPI 84 year old never smoker with a significant cardiac history that includes hypertension with diastolic CHF, coronary artery disease/PTCI, hyperlipidemia, atrial fibrillation and tachybradycardia syndrome with a pacemaker in place (03/2016), severe aortic stenosis for which she underwent TAVR 06/2016.  He is on amiodarone (approximately 1 year) and anticoagulation.  He also has a history of untreated obstructive sleep apnea, bladder cancer, prostate cancer.  He carries a diagnosis of long-standing asthma that was made many many years ago clinically, although he has not been on inhaled meds for many years, stopped them due to lack of efficacy. He has chronic back pain and wears a back brace - ? Some restrictive physiology from this.   A review of his chart shows that he has had recent medication changes, Bystolic adjusted back to metoprolol.  Also recently found to have an elevated TSH (06/29/17) and started on levothyroxine 12.5 mg 1 month ago.   He has been under evaluation for progressive fatigue that has been present for over two years. Also for exertional SOB, exhaustion after doing chores. Not clear from his hx whether SOB always accompanies the exhaustion. He is able to ride a bike, walk treadmill (limited by his knees). Able to do rowing machine for 5-8 minutes. Sx have definitely worsened last 2-3 months. He hears wheezing with some exercise, occasionally at rest or in evening. He was given albuterol in the last month, he has used it and it may help the wheeze some. He has a deviated septum, difficult to move air through the R side.   He isn't sure that either of these problems were improved by his TAVR, synthroid. He feels that his sx have slowly worsened.   He describes an episode in July when he was sitting on porch, warm day but not exerting, went from sitting to standing, experienced near  syncope, did not lose consciousness. Prompted the Myoview as below.   Pulmonary function testing was done 07/07/17 and I have reviewed.  His spirometry is grossly normal although he does have some curvature to his flow volume loop.  There is no significant bronchodilator response.  His lung volumes are also normal.  He has a decreased diffusion capacity and this does not correct when adjusted for his alveolar volume.    Review of Systems  Constitutional: Negative for fever and unexpected weight change.  HENT: Positive for congestion, sneezing and trouble swallowing. Negative for dental problem, ear pain, nosebleeds, postnasal drip, rhinorrhea, sinus pressure and sore throat.   Eyes: Negative for redness and itching.  Respiratory: Positive for cough, shortness of breath and wheezing. Negative for chest tightness.   Cardiovascular: Positive for palpitations. Negative for leg swelling.  Gastrointestinal: Negative for nausea and vomiting.  Genitourinary: Negative for dysuria.  Musculoskeletal: Negative for joint swelling.  Skin: Negative for rash.  Neurological: Negative for headaches.  Hematological: Does not bruise/bleed easily.  Psychiatric/Behavioral: Negative for dysphoric mood. The patient is not nervous/anxious.     Past Medical History:  Diagnosis Date  . Age-related macular degeneration   . Allergic rhinitis   . Aortic valve stenosis   . Arthritis    "hands, lower back" (04/30/2016)  . Asthma   . Carotid artery obstruction   . Chronic lower back pain   . Coronary arteriosclerosis in native artery   . Diverticulitis of colon   . DJD (degenerative joint disease)   .  Dyspnea    "constant but the degree changes"  . Dysthymia   . GERD (gastroesophageal reflux disease)   . History of elevated PSA 05/2009  . History of hiatal hernia   . HLD (hyperlipidemia)   . Hypertension   . Nocturnal hypoxemia   . OSA (obstructive sleep apnea)    intolerant to CPAP  . Paroxysmal  supraventricular tachycardia ([place])   . Pneumonia    "when I was a kid"  . Presence of permanent cardiac pacemaker   . Primary malignant neoplasm of bladder ([place])   . Prostate cancer [facility])      Family History  Problem Relation Age of Onset  . Heart disease Mother   . Heart disease Father      Social History   Socioeconomic History  . Marital status: Married    Spouse name: Not on file  . Number of children: Not on file  . Years of education: Not on file  . Highest education level: Not on file  Social Needs  . Financial resource strain: Not on file  . Food insecurity - worry: Not on file  . Food insecurity - inability: Not on file  . Transportation needs - medical: Not on file  . Transportation needs - non-medical: Not on file  Occupational History  . Occupation: retired  Tobacco Use  . Smoking status: Never Smoker  . Smokeless tobacco: Never Used  Substance and Sexual Activity  . Alcohol use: No    Alcohol/week: 0.0 oz    Comment: 04/30/2016 "nothing in years"  . Drug use: No  . Sexual activity: No  Other Topics Concern  . Not on file  Social History Narrative   Retired    Three children - One in [place], One in [place], and one in [place].         Allergies  Allergen Reactions  . Ciprofloxacin Anaphylaxis  . Hydrochlorothiazide Anaphylaxis  . Sulfa Antibiotics Anaphylaxis  . Ace Inhibitors Cough  . Losartan Cough  . Carvedilol Cough    Pt states it "causes him too cough."  . Lisinopril Cough    Pt reports worsening cough and "feeling wheezy."     Outpatient Medications Prior to Visit  Medication Sig Dispense Refill  . acetaminophen (TYLENOL) 500 MG tablet Take 500 mg by mouth as needed for mild pain.     Marland Kitchen albuterol (PROVENTIL HFA;VENTOLIN HFA) 108 (90 Base) MCG/ACT inhaler Inhale 2 puffs into the lungs every 6 (six) hours as needed for wheezing or shortness of breath. 1 Inhaler 2  . amiodarone (PACERONE) 200 MG tablet Take 200 mg (one tablet) by mouth  once daily Mon-Sat.  Do not take on Sunday. 90 tablet 3  . amLODipine (NORVASC) 5 MG tablet Take 1 tablet (5 mg total) by mouth daily. 30 tablet 2  . atorvastatin (LIPITOR) 20 MG tablet Take 1 tablet (20 mg total) by mouth daily. 90 tablet 3  . cholecalciferol (VITAMIN D) 1000 units tablet Take 1,000 Units by mouth 2 (two) times a week.     . Coenzyme Q10 (COQ-10 PO) Take 1 capsule by mouth 2 (two) times a week.     . Cyanocobalamin (VITAMIN B 12 PO) Take 1 capsule by mouth daily.    Marland Kitchen ELIQUIS 2.5 MG TABS tablet TAKE 1 TABLET(2.5 MG) BY MOUTH TWICE DAILY 60 tablet 5  . latanoprost (XALATAN) 0.005 % ophthalmic solution Place 1 drop into both eyes at bedtime.    Marland Kitchen levothyroxine (SYNTHROID, LEVOTHROID) 25 MCG tablet  Take 0.5 tablets (12.5 mcg total) by mouth daily before breakfast. 30 tablet 1  . metoprolol succinate (TOPROL XL) 25 MG 24 hr tablet Take 1 tablet (25 mg total) by mouth daily. 30 tablet 11  . nitroGLYCERIN (NITROSTAT) 0.4 MG SL tablet Place 1 tablet (0.4 mg total) under the tongue every 5 (five) minutes as needed. 25 tablet 3   No facility-administered medications prior to visit.         Objective:   Physical Exam Vitals:   08/03/17 0921 08/03/17 0922  BP:  132/76  Pulse:  89  SpO2:  98%  Weight: 144 lb (65.3 kg)   Height: 5' 6.5" (1.689 m)    Gen: Pleasant, elderly gentleman, in no distress,  normal affect  ENT: No lesions,  mouth clear,  oropharynx clear, no postnasal drip  Neck: No JVD, no stridor  Lungs: No use of accessory muscles, good air movement, no wheezes, no crackles  Cardiovascular: Regular, distant, no murmur  Musculoskeletal: No deformities, no cyanosis or clubbing.  He is wearing an elastic back brace  Neuro: alert, non focal  Skin: Warm, no lesions or rashes.  Some bruises on his arms     CT chest 03/20/16 --  COMPARISON:  No priors.  FINDINGS: CTA CHEST FINDINGS  Cardiovascular: Heart size is mildly enlarged. There is no significant  pericardial fluid, thickening or pericardial calcification. There is aortic atherosclerosis, as well as atherosclerosis of the great vessels of the mediastinum and the coronary arteries, including calcified atherosclerotic plaque in the left main, left anterior descending, left circumflex and right coronary arteries. Severe thickening calcification of the aortic valve.  Mediastinum/Lymph Nodes: No pathologically enlarged mediastinal or hilar lymph nodes. Densely calcified right hilar lymph node incidentally noted. Esophagus is unremarkable in appearance. No axillary lymphadenopathy.  Lungs/Pleura: There are several pulmonary nodules scattered throughout the lungs bilaterally, the largest of which measures up to 8 x 6 mm (mean diameter of 7 mm) in the subpleural aspect of the right lower lobe (image 41 of series 407). These are nonspecific, but most of these are subpleural in location, likely to represent subpleural lymph nodes. Areas of scarring and/or subsegmental atelectasis are noted in the lung bases bilaterally, particularly in the right lower lobe. No acute consolidative airspace disease. No pleural effusions. Small right-sided Bochdalek's hernia incidentally noted.  Musculoskeletal/Soft Tissues: There are no aggressive appearing lytic or blastic lesions noted in the visualized portions of the skeleton.    CXR 06/11/16 --  COMPARISON:  Chest x-ray of June 10, 2016.  FINDINGS: The lungs are well-expanded. Atelectasis on the left has resolved. The right lung is clear. The cardiac silhouette is mildly enlarged but stable. The pulmonary vascularity is normal. The aortic valve cage appears to be in stable position. The permanent pacemakers also in stable position. There is calcification in the wall of the aortic arch. The right internal jugular venous catheter tip projects over the midportion of the SVC.  IMPRESSION: Interval resolution of basilar atelectasis. No  pulmonary edema. No acute cardiopulmonary abnormality.  Thoracic aortic atherosclerosis.    Myoview 12/2016 --   Nuclear stress EF: 48%.  No T wave inversion was noted during stress.  There was no ST segment deviation noted during stress.  Defect 1: There is a medium defect of moderate severity.  This is an intermediate risk study.   Medium size, moderate severity mostly fixed (SDS 2) apical and apical septal perfusion defect, likely representing LBBB abnormality. No significant reversible ischemia. LVEF 48%  with incoordinate septal motion. This is an intermediate risk study (due to LVEF <49%).          Assessment & Plan:  DOE (dyspnea on exertion) Almost certainly multifactorial.  There is a bit of difficulty differentiating exertional dyspnea with progressive exertional fatigue.  He describes both.  He also describes at least one episode of orthostasis and near syncope.  Certainly most of the factors are likely due to his underlying cardiac disease.  These issues have all been treated and he still has symptoms.  Some of what he is experiencing could be medication related, beta-blocker, calcium channel blocker etc.  Also note that he is being evaluated and up titrated with levothyroxine for hypothyroidism.  Question how often he is in atrial fibrillation, how often he is paced and whether this is a contributor.  There are several potential pulmonary issues: - He carries a history of asthma although there is nothing on pulmonary function testing to support this except for some curvature of his flow volume loop.  His FEV1 and airflows appear to be normal.  He may have responded clinically to albuterol, at least it seems to have helped some wheeze that he hears both at rest and with exertion. He has not yet tested this to see if it benefits his exercise routine.  I will ask him to do so, keep track of how his symptoms respond.  No clear indication to put him on a scheduled  bronchodilator at this time. - He has a diffusion defect on pulmonary function testing.  This raises the potential for interstitial disease, pulmonary vascular disease.  There was no desaturation on walking oximetry today.  We will perform a ventilation perfusion scan to screen for possible chronic thromboembolic disease although he is already on anticoagulation.  I will also perform a high-resolution CT scan of his chest to look for any evidence of interstitial disease in the setting of his amiodarone.  No clear indication to stop his amiodarone right now. -He has untreated obstructive sleep apnea.  I do not see a recent sleep study.  This certainly could be a contributor to overall fatigue.  May need to pursue a repeat sleep study, treatment. -Note potential for some relative restrictive lung disease given his back discomfort and the fact that he wears an elastic back brace almost all the time. -He had small pulmonary nodules on CT scan of the chest back in 03/2016.  We can follow these on the high-resolution CT scan mentioned above.  These are almost certainly low risk in a never smoker.  In the end if we do not identify any significant pulmonary contributor than I believe we will ascribe his symptoms to his cardiac disease and its associated limitations, possibly some relative deconditioning because his exercise routine has been limited by his back and knee pain.  If we really needed to further evaluate then cardiopulmonary exercise testing would potentially be helpful.    Baltazar Apo, MD, PhD 08/03/2017, 10:21 AM  Pulmonary and Critical Care [phone] or if no answer [phone]

## 2017-08-03 NOTE — Assessment & Plan Note (Addendum)
Almost certainly multifactorial.  There is a bit of difficulty differentiating exertional dyspnea with progressive exertional fatigue.  He describes both.  He also describes at least one episode of orthostasis and near syncope.  Certainly most of the factors are likely due to his underlying cardiac disease.  These issues have all been treated and he still has symptoms.  Some of what he is experiencing could be medication related, beta-blocker, calcium channel blocker etc.  Also note that he is being evaluated and up titrated with levothyroxine for hypothyroidism.  Question how often he is in atrial fibrillation, how often he is paced and whether this is a contributor.  There are several potential pulmonary issues: - He carries a history of asthma although there is nothing on pulmonary function testing to support this except for some curvature of his flow volume loop.  His FEV1 and airflows appear to be normal.  He may have responded clinically to albuterol, at least it seems to have helped some wheeze that he hears both at rest and with exertion. He has not yet tested this to see if it benefits his exercise routine.  I will ask him to do so, keep track of how his symptoms respond.  No clear indication to put him on a scheduled bronchodilator at this time. - He has a diffusion defect on pulmonary function testing.  This raises the potential for interstitial disease, pulmonary vascular disease.  There was no desaturation on walking oximetry today.  We will perform a ventilation perfusion scan to screen for possible chronic thromboembolic disease although he is already on anticoagulation.  I will also perform a high-resolution CT scan of his chest to look for any evidence of interstitial disease in the setting of his amiodarone.  No clear indication to stop his amiodarone right now. -He has untreated obstructive sleep apnea.  I do not see a recent sleep study.  This certainly could be a contributor to overall  fatigue.  May need to pursue a repeat sleep study, treatment. -Note potential for some relative restrictive lung disease given his back discomfort and the fact that he wears an elastic back brace almost all the time. -He had small pulmonary nodules on CT scan of the chest back in 03/2016.  We can follow these on the high-resolution CT scan mentioned above.  These are almost certainly low risk in a never smoker.  In the end if we do not identify any significant pulmonary contributor than I believe we will ascribe his symptoms to his cardiac disease and its associated limitations, possibly some relative deconditioning because his exercise routine has been limited by his back and knee pain.  If we really needed to further evaluate then cardiopulmonary exercise testing would potentially be helpful.

## 2017-08-06 ENCOUNTER — Ambulatory Visit (HOSPITAL_COMMUNITY)
Admission: RE | Admit: 2017-08-06 | Discharge: 2017-08-06 | Disposition: A | Discharge status: Home / Self Care | Payer: Medicare Other | Source: Ambulatory Visit | Attending: Emergency Medicine | Admitting: Emergency Medicine

## 2017-08-06 DIAGNOSIS — I517 Cardiomegaly: Secondary | ICD-10-CM | POA: Insufficient documentation

## 2017-08-06 DIAGNOSIS — I272 Pulmonary hypertension, unspecified: Secondary | ICD-10-CM

## 2017-08-06 DIAGNOSIS — R918 Other nonspecific abnormal finding of lung field: Secondary | ICD-10-CM | POA: Diagnosis not present

## 2017-08-06 DIAGNOSIS — Z952 Presence of prosthetic heart valve: Secondary | ICD-10-CM | POA: Insufficient documentation

## 2017-08-06 DIAGNOSIS — Z950 Presence of cardiac pacemaker: Secondary | ICD-10-CM | POA: Insufficient documentation

## 2017-08-06 DIAGNOSIS — R0602 Shortness of breath: Secondary | ICD-10-CM | POA: Diagnosis not present

## 2017-08-06 DIAGNOSIS — J849 Interstitial pulmonary disease, unspecified: Secondary | ICD-10-CM | POA: Insufficient documentation

## 2017-08-06 DIAGNOSIS — I2510 Atherosclerotic heart disease of native coronary artery without angina pectoris: Secondary | ICD-10-CM | POA: Insufficient documentation

## 2017-08-06 DIAGNOSIS — I700 Atherosclerosis of aorta: Secondary | ICD-10-CM | POA: Diagnosis not present

## 2017-08-06 MED ORDER — TECHNETIUM TO 99M ALBUMIN AGGREGATED
4.3900 | Freq: Once | INTRAVENOUS | Status: AC | PRN
  Administered 2017-08-06: 4.39 via INTRAVENOUS

## 2017-08-06 MED ORDER — TECHNETIUM TC 99M DIETHYLENETRIAME-PENTAACETIC ACID
32.6000 | Freq: Once | INTRAVENOUS | Status: AC | PRN
  Administered 2017-08-06: 32.6 via RESPIRATORY_TRACT

## 2017-08-06 MED ORDER — TECHNETIUM TO 99M ALBUMIN AGGREGATED: 4.9000 | Freq: Once | INTRAVENOUS | Status: DC | PRN

## 2017-08-11 ENCOUNTER — Other Ambulatory Visit: Payer: Medicare Other

## 2017-08-11 DIAGNOSIS — R7989 Other specified abnormal findings of blood chemistry: Secondary | ICD-10-CM | POA: Diagnosis not present

## 2017-08-11 LAB — TSH: TSH: 3.56 u[IU]/mL (ref 0.450–4.500)

## 2017-08-12 ENCOUNTER — Other Ambulatory Visit: Payer: Self-pay | Admitting: Cardiology

## 2017-08-12 MED ORDER — AMLODIPINE BESYLATE 5 MG PO TABS: 5.0000 mg | ORAL_TABLET | Freq: Every day | ORAL | 2 refills | Status: DC

## 2017-08-12 NOTE — Telephone Encounter (Signed)
Pt's medication was sent to pt's pharmacy as requested. Confirmation received.  °

## 2017-08-26 ENCOUNTER — Encounter: Payer: Self-pay | Admitting: Adult Health

## 2017-08-26 ENCOUNTER — Ambulatory Visit: Payer: Medicare Other | Admitting: Adult Health

## 2017-08-26 VITALS — BP 138/62 | HR 81 | Temp 97.9°F | Wt 142.0 lb

## 2017-08-26 DIAGNOSIS — T148XXA Other injury of unspecified body region, initial encounter: Secondary | ICD-10-CM

## 2017-08-26 DIAGNOSIS — K4090 Unilateral inguinal hernia, without obstruction or gangrene, not specified as recurrent: Secondary | ICD-10-CM

## 2017-08-26 NOTE — Progress Notes (Signed)
Subjective:    Patient ID: Ryan [name], male    DOB: [birth year], 84 y.o.   MRN: [id number]  HPI  84 year old male who  has a past medical history of Age-related macular degeneration, Allergic rhinitis, Aortic valve stenosis, Arthritis, Asthma, Carotid artery obstruction, Chronic lower back pain, Coronary arteriosclerosis in native artery, Diverticulitis of colon, DJD (degenerative joint disease), Dyspnea, Dysthymia, GERD (gastroesophageal reflux disease), History of elevated PSA (05/2009), History of hiatal hernia, HLD (hyperlipidemia), Hypertension, Nocturnal hypoxemia, OSA (obstructive sleep apnea), Paroxysmal supraventricular tachycardia ([place]), Pneumonia, Presence of permanent cardiac pacemaker, Primary malignant neoplasm of bladder ([place]), and Prostate cancer ([place]).  He presents to the office today for for an acute visit.  1.Rash -reports noticing a small red rash on the left side of his face.  He wants to make sure this is nothing to be concerned about.  Denies any itching, pain, or drainage.  Is noticed 2 or 3 days ago and does not feel as though the rash is becoming larger  2. Hernia -he has noticed a bulge in his left lower abdomen.  Concerned that he might hernia.  He denies any pain, discomfort, nausea, vomiting, or bruising.  He does endorse constipation this has been a long-standing issue off and on for him.   Review of Systems See HPI   Past Medical History:  Diagnosis Date  . Age-related macular degeneration   . Allergic rhinitis   . Aortic valve stenosis   . Arthritis    "hands, lower back" (04/30/2016)  . Asthma   . Carotid artery obstruction   . Chronic lower back pain   . Coronary arteriosclerosis in native artery   . Diverticulitis of colon   . DJD (degenerative joint disease)   . Dyspnea    "constant but the degree changes"  . Dysthymia   . GERD (gastroesophageal reflux disease)   . History of elevated PSA 05/2009  . History of hiatal hernia   . HLD  (hyperlipidemia)   . Hypertension   . Nocturnal hypoxemia   . OSA (obstructive sleep apnea)    intolerant to CPAP  . Paroxysmal supraventricular tachycardia ([place])   . Pneumonia    "when I was a kid"  . Presence of permanent cardiac pacemaker   . Primary malignant neoplasm of bladder ([place])   . Prostate cancer [facility])     Social History   Socioeconomic History  . Marital status: Married    Spouse name: Not on file  . Number of children: Not on file  . Years of education: Not on file  . Highest education level: Not on file  Occupational History  . Occupation: retired  Scientific laboratory technician  . Financial resource strain: Not on file  . Food insecurity:    Worry: Not on file    Inability: Not on file  . Transportation needs:    Medical: Not on file    Non-medical: Not on file  Tobacco Use  . Smoking status: Never Smoker  . Smokeless tobacco: Never Used  Substance and Sexual Activity  . Alcohol use: No    Alcohol/week: 0.0 oz    Comment: 04/30/2016 "nothing in years"  . Drug use: No  . Sexual activity: Never  Lifestyle  . Physical activity:    Days per week: Not on file    Minutes per session: Not on file  . Stress: Not on file  Relationships  . Social connections:    Talks on phone: Not on file  Gets together: Not on file    Attends religious service: Not on file    Active member of club or organization: Not on file    Attends meetings of clubs or organizations: Not on file    Relationship status: Not on file  . Intimate partner violence:    Fear of current or ex partner: Not on file    Emotionally abused: Not on file    Physically abused: Not on file    Forced sexual activity: Not on file  Other Topics Concern  . Not on file  Social History Narrative   Retired    Three children - One in [place], One in [place], and one in [place].        Past Surgical History:  Procedure Laterality Date  . APPENDECTOMY    . CARDIAC CATHETERIZATION N/A 01/17/2016   Procedure:  Right/Left Heart Cath and Coronary Angiography;  Surgeon: Belva Crome, MD;  Location: [place] [facility];  Service: Cardiovascular;  Laterality: N/A;  . CARDIAC CATHETERIZATION N/A 04/30/2016   Procedure: Coronary/Graft Atherectomy;  Surgeon: Sherren Mocha, MD;  Location: [place] [facility];  Service: Cardiovascular;  Laterality: N/A;  . CAROTID ENDARTERECTOMY Bilateral   . CATARACT EXTRACTION W/ INTRAOCULAR LENS  IMPLANT, BILATERAL Bilateral   . COLON SURGERY  1981   Meckles Diverticulum with volvulus  . EP IMPLANTABLE DEVICE N/A 03/21/2016   Procedure: Pacemaker Implant;  Surgeon: Evans Lance, MD;  Location: [place] [facility];  Service: Cardiovascular;  Laterality: N/A;  . INGUINAL HERNIA REPAIR Right 2009  . INSERTION PROSTATE RADIATION SEED    . TEE WITHOUT CARDIOVERSION N/A 06/10/2016   Procedure: TRANSESOPHAGEAL ECHOCARDIOGRAM (TEE);  Surgeon: Sherren Mocha, MD;  Location: [place];  Service: Open Heart Surgery;  Laterality: N/A;  . TONSILLECTOMY  ~ 1947  . TRANSCATHETER AORTIC VALVE REPLACEMENT, TRANSFEMORAL N/A 06/10/2016   Procedure: TRANSCATHETER AORTIC VALVE REPLACEMENT, TRANSFEMORAL;  Surgeon: Sherren Mocha, MD;  Location: [facility];  Service: Open Heart Surgery;  Laterality: N/A;    Family History  Problem Relation Age of Onset  . Heart disease Mother   . Heart disease Father     Allergies  Allergen Reactions  . Ciprofloxacin Anaphylaxis  . Hydrochlorothiazide Anaphylaxis  . Sulfa Antibiotics Anaphylaxis  . Ace Inhibitors Cough  . Losartan Cough  . Carvedilol Cough    Pt states it "causes him too cough."  . Lisinopril Cough    Pt reports worsening cough and "feeling wheezy."    Current Outpatient Medications on File Prior to Visit  Medication Sig Dispense Refill  . acetaminophen (TYLENOL) 500 MG tablet Take 500 mg by mouth as needed for mild pain.     Marland Kitchen albuterol (PROVENTIL HFA;VENTOLIN HFA) 108 (90 Base) MCG/ACT inhaler Inhale 2 puffs into the lungs every 6 (six)  hours as needed for wheezing or shortness of breath. 1 Inhaler 2  . amiodarone (PACERONE) 200 MG tablet Take 200 mg (one tablet) by mouth once daily Mon-Sat.  Do not take on Sunday. 90 tablet 3  . amLODipine (NORVASC) 5 MG tablet Take 1 tablet (5 mg total) by mouth daily. 90 tablet 2  . atorvastatin (LIPITOR) 20 MG tablet Take 1 tablet (20 mg total) by mouth daily. 90 tablet 3  . cholecalciferol (VITAMIN D) 1000 units tablet Take 1,000 Units by mouth 2 (two) times a week.     . Coenzyme Q10 (COQ-10 PO) Take 1 capsule by mouth 2 (two) times a week.     Marland Kitchen  Cyanocobalamin (VITAMIN B 12 PO) Take 1 capsule by mouth daily.    Marland Kitchen ELIQUIS 2.5 MG TABS tablet TAKE 1 TABLET(2.5 MG) BY MOUTH TWICE DAILY 60 tablet 5  . latanoprost (XALATAN) 0.005 % ophthalmic solution Place 1 drop into both eyes at bedtime.    Marland Kitchen levothyroxine (SYNTHROID, LEVOTHROID) 25 MCG tablet Take 0.5 tablets (12.5 mcg total) by mouth daily before breakfast. 30 tablet 1  . metoprolol succinate (TOPROL XL) 25 MG 24 hr tablet Take 1 tablet (25 mg total) by mouth daily. 30 tablet 11  . nitroGLYCERIN (NITROSTAT) 0.4 MG SL tablet Place 1 tablet (0.4 mg total) under the tongue every 5 (five) minutes as needed. 25 tablet 3   No current facility-administered medications on file prior to visit.     BP 138/62 (BP Location: Left Arm, Patient Position: Sitting, Cuff Size: Normal)   Pulse 81   Temp 97.9 F (36.6 C) (Oral)   Wt 142 lb (64.4 kg)   SpO2 97%   BMI 22.58 kg/m       Objective:   Physical Exam  Constitutional: He is oriented to person, place, and time. He appears well-developed and well-nourished. No distress.  Cardiovascular: Normal rate, regular rhythm, normal heart sounds and intact distal pulses. Exam reveals no gallop and no friction rub.  No murmur heard. Pulmonary/Chest: Effort normal and breath sounds normal. No respiratory distress. He has no wheezes. He has no rales. He exhibits no tenderness.  Abdominal: Soft. Bowel  sounds are normal. He exhibits no distension and no mass. There is no tenderness. There is no rebound and no guarding. A hernia is present. Hernia confirmed positive in the left inguinal area.  Musculoskeletal:  Right leg brace noted  Neurological: He is alert and oriented to person, place, and time.  Skin: Skin is warm and dry. No rash noted. He is not diaphoretic. No erythema. No pallor.  Abrasion on left cheek  Psychiatric: He has a normal mood and affect. His behavior is normal. Judgment and thought content normal.  Nursing note and vitals reviewed.     Assessment & Plan:  1. Skin abrasion -No rash noted.  He does have various age spots but the area in question appears to be a small, dime sized abrasion.  He is on Eliquis  2.5 mg.   2. Left inguinal hernia -Positive left inguinal hernia.  Easily reducible.  No pain with palpation.  We discussed treatment for inguinal hernias.  He wishes to not see surgery at this time but will follow-up if he notices any discomfort.  I advised high-fiber diet to help with bowel movements  Dorothyann Peng, NP

## 2017-08-26 NOTE — Patient Instructions (Signed)
I would like you to increase the fiber diet ( green leafy vegetables, fruits, prunes, and.or fiber supplement. Stay well hydrated with water.   Try and avoid red meats, rice, cheeses, pasta  Lets follow up with each other after you seen Cardiology and Pulmonary

## 2017-09-01 ENCOUNTER — Encounter: Payer: Self-pay | Admitting: Emergency Medicine

## 2017-09-01 ENCOUNTER — Ambulatory Visit: Payer: Medicare Other | Admitting: Emergency Medicine

## 2017-09-01 DIAGNOSIS — R0600 Dyspnea, unspecified: Secondary | ICD-10-CM

## 2017-09-01 DIAGNOSIS — R0609 Other forms of dyspnea: Secondary | ICD-10-CM

## 2017-09-01 NOTE — Patient Instructions (Signed)
Your ventilation perfusion scan does not show any evidence of blood clots in the lungs. Your CT scan of the chest is overall reassuring.  There may be some very subtle evidence of poorly expanded areas at the bottom of each lung.  This is not enough to explain your shortness of breath. We will follow this to ensure that it does not change over time. At this time I do not see anything that would suggest active amiodarone toxicity.  We do not need to stop your amiodarone. Keep albuterol available to use 2 puffs if needed for wheezing, shortness of breath, chest tightness.  You do not need to take this medication on a schedule. Continue with your exercise routine and building up his stamina, strength, endurance. Follow with Dr Lamonte Sakai in 12 months or sooner if you have any problems

## 2017-09-01 NOTE — Assessment & Plan Note (Signed)
His pulmonary evaluation thus far has revealed some mild abnormalities.   - Specifically he has some possible evidence (subtle) for obstruction on his spirometry, and he has had some resolution of wheezing with empiric albuterol.  Okay to continue this as needed.  Unfortunately it has not helped his exertional dyspnea - Also we have noted some very mild basilar interstitial changes of unclear etiology.  This could be as simple as some basilar atelectasis but raises the question of scar versus interstitial lung disease.  The changes are minor and I do not believe there is any indication to stop his amiodarone due to this observation.  The most we should do is follow serial imaging to ensure that the basilar changes do not become more relevant.  I will follow him with chest x-ray.  Consider repeat CT depending on his chest x-ray and his clinical status after 1 year. -Neither of the above are enough to explain his exertional limitation.  At this point I believe that it is multifactorial but without much in the way of a pulmonary contribution except for some restrictive lung disease due to his back brace.  Agree with cardiopulmonary rehab if he is able to do so.

## 2017-09-01 NOTE — Progress Notes (Signed)
Subjective:    Patient ID: Ryan [name], male    DOB: 1929/10/08, 84 y.o.   MRN: [id number]  HPI 84 year old never smoker with a significant cardiac history that includes hypertension with diastolic CHF, coronary artery disease/PTCI, hyperlipidemia, atrial fibrillation and tachybradycardia syndrome with a pacemaker in place (03/2016), severe aortic stenosis for which she underwent TAVR 06/2016.  He is on amiodarone (approximately 1 year) and anticoagulation.  He also has a history of untreated obstructive sleep apnea, bladder cancer, prostate cancer.  He carries a diagnosis of long-standing asthma that was made many many years ago clinically, although he has not been on inhaled meds for many years, stopped them due to lack of efficacy. He has chronic back pain and wears a back brace - ? Some restrictive physiology from this.   A review of his chart shows that he has had recent medication changes, Bystolic adjusted back to metoprolol.  Also recently found to have an elevated TSH (06/29/17) and started on levothyroxine 12.5 mg 1 month ago.   He has been under evaluation for progressive fatigue that has been present for over two years. Also for exertional SOB, exhaustion after doing chores. Not clear from his hx whether SOB always accompanies the exhaustion. He is able to ride a bike, walk treadmill (limited by his knees). Able to do rowing machine for 5-8 minutes. Sx have definitely worsened last 2-3 months. He hears wheezing with some exercise, occasionally at rest or in evening. He was given albuterol in the last month, he has used it and it may help the wheeze some. He has a deviated septum, difficult to move air through the R side.   He isn't sure that either of these problems were improved by his TAVR, synthroid. He feels that his sx have slowly worsened.   He describes an episode in July when he was sitting on porch, warm day but not exerting, went from sitting to standing, experienced near  syncope, did not lose consciousness. Prompted the Myoview as below.   Pulmonary function testing was done 07/07/17 and I have reviewed.  His spirometry is grossly normal although he does have some curvature to his flow volume loop.  There is no significant bronchodilator response.  His lung volumes are also normal.  He has a decreased diffusion capacity and this does not correct when adjusted for his alveolar volume.   ROV 09/01/17 --pleasant 84 year old gentleman with history as outlined above who follows up today for multifactorial dyspnea.  Teasing out his symptoms he also had episodes of near syncope.  Based on some possible mild obstructive lung disease I asked him to try using albuterol to see if he would benefit. He reports that it may have helped him some in that it he feels that it stops him form wheezing.  It hasn't changed his fatigue, dyspnea with his exercise routine.   I also performed a high-resolution CT scan of the chest which I have reviewed.  This shows a very subtle basilar interstitial changes that are not necessarily consistent with UIP.  These could relate to his amiodarone use but again or not necessarily consistent with this.  He has some scattered bilateral stable small pulmonary nodules that are almost certainly benign.  Again not consistent with amiodarone toxicity.V/q scan 3/7 was low prob for PE.  He tells me that the albuterol may have helped his intermittent wheezing, his wife notes this as well.  It has not significantly impacted his original complaint which was fatigue,  weakness and inability to do his usual exercise routine that was possible before his cardiac procedures.    Review of Systems  Constitutional: Negative for fever and unexpected weight change.  HENT: Positive for congestion, sneezing and trouble swallowing. Negative for dental problem, ear pain, nosebleeds, postnasal drip, rhinorrhea, sinus pressure and sore throat.   Eyes: Negative for redness and itching.    Respiratory: Positive for cough, shortness of breath and wheezing. Negative for chest tightness.   Cardiovascular: Positive for palpitations. Negative for leg swelling.  Gastrointestinal: Negative for nausea and vomiting.  Genitourinary: Negative for dysuria.  Musculoskeletal: Negative for joint swelling.  Skin: Negative for rash.  Neurological: Negative for headaches.  Hematological: Does not bruise/bleed easily.  Psychiatric/Behavioral: Negative for dysphoric mood. The patient is not nervous/anxious.     Past Medical History:  Diagnosis Date  . Age-related macular degeneration   . Allergic rhinitis   . Aortic valve stenosis   . Arthritis    "hands, lower back" (04/30/2016)  . Asthma   . Carotid artery obstruction   . Chronic lower back pain   . Coronary arteriosclerosis in native artery   . Diverticulitis of colon   . DJD (degenerative joint disease)   . Dyspnea    "constant but the degree changes"  . Dysthymia   . GERD (gastroesophageal reflux disease)   . History of elevated PSA 05/2009  . History of hiatal hernia   . HLD (hyperlipidemia)   . Hypertension   . Nocturnal hypoxemia   . OSA (obstructive sleep apnea)    intolerant to CPAP  . Paroxysmal supraventricular tachycardia ([place])   . Pneumonia    "when I was a kid"  . Presence of permanent cardiac pacemaker   . Primary malignant neoplasm of bladder ([place])   . Prostate cancer [facility])      Family History  Problem Relation Age of Onset  . Heart disease Mother   . Heart disease Father      Social History   Socioeconomic History  . Marital status: Married    Spouse name: Not on file  . Number of children: Not on file  . Years of education: Not on file  . Highest education level: Not on file  Occupational History  . Occupation: retired  Scientific laboratory technician  . Financial resource strain: Not on file  . Food insecurity:    Worry: Not on file    Inability: Not on file  . Transportation needs:    Medical: Not on  file    Non-medical: Not on file  Tobacco Use  . Smoking status: Never Smoker  . Smokeless tobacco: Never Used  Substance and Sexual Activity  . Alcohol use: No    Alcohol/week: 0.0 oz    Comment: 04/30/2016 "nothing in years"  . Drug use: No  . Sexual activity: Never  Lifestyle  . Physical activity:    Days per week: Not on file    Minutes per session: Not on file  . Stress: Not on file  Relationships  . Social connections:    Talks on phone: Not on file    Gets together: Not on file    Attends religious service: Not on file    Active member of club or organization: Not on file    Attends meetings of clubs or organizations: Not on file    Relationship status: Not on file  . Intimate partner violence:    Fear of current or ex partner: Not on file  Emotionally abused: Not on file    Physically abused: Not on file    Forced sexual activity: Not on file  Other Topics Concern  . Not on file  Social History Narrative   Retired    Three children - One in [place], One in [place], and one in [place].         Allergies  Allergen Reactions  . Ciprofloxacin Anaphylaxis  . Hydrochlorothiazide Anaphylaxis  . Sulfa Antibiotics Anaphylaxis  . Ace Inhibitors Cough  . Losartan Cough  . Carvedilol Cough    Pt states it "causes him too cough."  . Lisinopril Cough    Pt reports worsening cough and "feeling wheezy."     Outpatient Medications Prior to Visit  Medication Sig Dispense Refill  . acetaminophen (TYLENOL) 500 MG tablet Take 500 mg by mouth as needed for mild pain.     Marland Kitchen albuterol (PROVENTIL HFA;VENTOLIN HFA) 108 (90 Base) MCG/ACT inhaler Inhale 2 puffs into the lungs every 6 (six) hours as needed for wheezing or shortness of breath. 1 Inhaler 2  . amiodarone (PACERONE) 200 MG tablet Take 200 mg (one tablet) by mouth once daily Mon-Sat.  Do not take on Sunday. 90 tablet 3  . amLODipine (NORVASC) 5 MG tablet Take 1 tablet (5 mg total) by mouth daily. 90 tablet 2  .  atorvastatin (LIPITOR) 20 MG tablet Take 1 tablet (20 mg total) by mouth daily. 90 tablet 3  . cholecalciferol (VITAMIN D) 1000 units tablet Take 1,000 Units by mouth 2 (two) times a week.     . Coenzyme Q10 (COQ-10 PO) Take 1 capsule by mouth 2 (two) times a week.     . Cyanocobalamin (VITAMIN B 12 PO) Take 1 capsule by mouth daily.    Marland Kitchen ELIQUIS 2.5 MG TABS tablet TAKE 1 TABLET(2.5 MG) BY MOUTH TWICE DAILY 60 tablet 5  . latanoprost (XALATAN) 0.005 % ophthalmic solution Place 1 drop into both eyes at bedtime.    Marland Kitchen levothyroxine (SYNTHROID, LEVOTHROID) 25 MCG tablet Take 0.5 tablets (12.5 mcg total) by mouth daily before breakfast. 30 tablet 1  . metoprolol succinate (TOPROL XL) 25 MG 24 hr tablet Take 1 tablet (25 mg total) by mouth daily. 30 tablet 11  . nitroGLYCERIN (NITROSTAT) 0.4 MG SL tablet Place 1 tablet (0.4 mg total) under the tongue every 5 (five) minutes as needed. 25 tablet 3   No facility-administered medications prior to visit.         Objective:   Physical Exam Vitals:   09/01/17 0958 09/01/17 0959  BP:  130/80  Pulse:  85  SpO2:  98%  Weight: 140 lb (63.5 kg)   Height: 5\' 6"  (1.676 m)    Gen: Pleasant, elderly gentleman, in no distress,  normal affect  ENT: No lesions,  mouth clear,  oropharynx clear, no postnasal drip  Neck: No JVD, no stridor  Lungs: No use of accessory muscles, good air movement, no wheezes, no crackles  Cardiovascular: Regular, distant, no murmur  Musculoskeletal: No deformities, no cyanosis or clubbing.  He is wearing an elastic back brace  Neuro: alert, non focal  Skin: Warm, no lesions or rashes.  Some bruises on his arms   V/q scan 08/06/17 --  FINDINGS: Ventilation: Central airway deposition of aerosol. Swallowed aerosol within stomach. No definite segmental or subsegmental ventilation defects identified. Prominent aortic arch.  Perfusion: Mildly prominent aortic arch. No perfusion abnormalities.  IMPRESSION: Normal  ventilation and perfusion lung scan.    CT chest  08/07/17 --  COMPARISON:  Chest CT 03/19/2016.  FINDINGS: Cardiovascular: Heart size is mildly enlarged. There is no significant pericardial fluid, thickening or pericardial calcification. There is aortic atherosclerosis, as well as atherosclerosis of the great vessels of the mediastinum and the coronary arteries, including calcified atherosclerotic plaque in the left main, left anterior descending, left circumflex and right coronary arteries. Implantable aortic valve in position. Left-sided pacemaker/AICD with lead tips terminating in the right atrium and right ventricular apex.  Mediastinum/Nodes: No pathologically enlarged mediastinal or hilar lymph nodes. Please note that accurate exclusion of hilar adenopathy is limited on noncontrast CT scans. Small hiatal hernia. No axillary lymphadenopathy.  Lungs/Pleura: Multiple small pulmonary nodules scattered throughout the lungs bilaterally, largest of which is in the anterior aspect of the superior segment of the right lower lobe intimately associated with the major fissure (axial image 81 of series 6) measuring 10 x 6 mm (mean diameter of 8 mm). These nodules all appear unchanged in size, number and distribution compared to prior study from 03/19/2016, strongly favored to be benign. No larger more suspicious appearing pulmonary nodules or masses are noted. No acute consolidative airspace disease. No pleural effusions. High-resolution images demonstrate very subtle areas of ground-glass attenuation and septal thickening, most evident in the extreme lung bases. No traction bronchiectasis or honeycombing.  Upper Abdomen: Aortic atherosclerosis.  Musculoskeletal: There are no aggressive appearing lytic or blastic lesions noted in the visualized portions of the skeleton.  IMPRESSION: 1. There are very subtle fibrotic changes in the extreme lung bases which are highly  nonspecific. 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 If there is persistent clinical concern for interstitial lung disease, repeat high-resolution chest CT should be obtained in 12 months to assess for temporal changes in the appearance of the lung parenchyma. 2. Multiple pulmonary nodules scattered throughout the lungs bilaterally, stable in size, number and distribution compared to the prior examination from 03/19/2016, strongly favored to be benign. Attention at time of follow-up high-resolution chest CT is suggested. 3. Aortic atherosclerosis, in addition to left main and 3 vessel coronary artery disease. 4. Status post TAVR.  CXR 06/11/16 --  COMPARISON:  Chest x-ray of June 10, 2016.  FINDINGS: The lungs are well-expanded. Atelectasis on the left has resolved. The right lung is clear. The cardiac silhouette is mildly enlarged but stable. The pulmonary vascularity is normal. The aortic valve cage appears to be in stable position. The permanent pacemakers also in stable position. There is calcification in the wall of the aortic arch. The right internal jugular venous catheter tip projects over the midportion of the SVC.  IMPRESSION: Interval resolution of basilar atelectasis. No pulmonary edema. No acute cardiopulmonary abnormality.  Thoracic aortic atherosclerosis.    Myoview 12/2016 --   Nuclear stress EF: 48%.  No T wave inversion was noted during stress.  There was no ST segment deviation noted during stress.  Defect 1: There is a medium defect of moderate severity.  This is an intermediate risk study.   Medium size, moderate severity mostly fixed (SDS 2) apical and apical septal perfusion defect, likely representing LBBB abnormality. No significant reversible ischemia. LVEF 48% with incoordinate septal motion. This is an intermediate risk study (due  to LVEF <49%).          Assessment & Plan:  DOE (dyspnea on exertion) His pulmonary evaluation thus far has revealed some mild abnormalities.   - Specifically he has some  possible evidence (subtle) for obstruction on his spirometry, and he has had some resolution of wheezing with empiric albuterol.  Okay to continue this as needed.  Unfortunately it has not helped his exertional dyspnea - Also we have noted some very mild basilar interstitial changes of unclear etiology.  This could be as simple as some basilar atelectasis but raises the question of scar versus interstitial lung disease.  The changes are minor and I do not believe there is any indication to stop his amiodarone due to this observation.  The most we should do is follow serial imaging to ensure that the basilar changes do not become more relevant.  I will follow him with chest x-ray.  Consider repeat CT depending on his chest x-ray and his clinical status after 1 year. -Neither of the above are enough to explain his exertional limitation.  At this point I believe that it is multifactorial but without much in the way of a pulmonary contribution except for some restrictive lung disease due to his back brace.  Agree with cardiopulmonary rehab if he is able to do so.    Baltazar Apo, MD, PhD 09/01/2017, 11:39 AM [facility] [phone] or if no answer [phone]

## 2017-09-03 ENCOUNTER — Telehealth: Payer: Self-pay | Admitting: Cardiology

## 2017-09-03 NOTE — Telephone Encounter (Signed)
New Message:    Pt is calling to reschedule tomorrow's appt I offered the pt the next available which is in aug and pt states he wants a nurse to call him back

## 2017-09-03 NOTE — Telephone Encounter (Signed)
Patient called to cancel his appt 4/5 with Dr. Meda Coffee, because of illness.Ryan [name]   He wanted to reschedule, but nothing was available until August 21 per scheduling and he of course, wanted something sooner.Ryan [name]  He would like to speak to [facility] concerning this matter..  Please advise..  Thank you

## 2017-09-04 ENCOUNTER — Ambulatory Visit: Payer: Medicare Other | Admitting: Cardiology

## 2017-09-04 NOTE — Telephone Encounter (Signed)
New message    April 8th 240p is fine

## 2017-09-04 NOTE — Telephone Encounter (Signed)
Left a detailed message on the pts VM that Dr Meda Coffee can see him next Monday 09/07/17 at 2:40 pm.  Held slot for pt.  Advised the pt to call back to endorse if this date and time works or not.

## 2017-09-04 NOTE — Telephone Encounter (Signed)
Appt scheduled for 09/07/17 at 2:40 pm.  Pt made aware of appt date and time by [facility] scheduling.

## 2017-09-06 ENCOUNTER — Ambulatory Visit (INDEPENDENT_AMBULATORY_CARE_PROVIDER_SITE_OTHER): Payer: Medicare Other

## 2017-09-06 ENCOUNTER — Ambulatory Visit (HOSPITAL_COMMUNITY)
Admission: EM | Admit: 2017-09-06 | Discharge: 2017-09-06 | Disposition: A | Discharge status: Home / Self Care | Payer: Medicare Other | Attending: Physician Assistant | Admitting: Physician Assistant

## 2017-09-06 ENCOUNTER — Other Ambulatory Visit: Payer: Self-pay

## 2017-09-06 ENCOUNTER — Encounter (HOSPITAL_COMMUNITY): Payer: Self-pay | Admitting: *Deleted

## 2017-09-06 DIAGNOSIS — R05 Cough: Secondary | ICD-10-CM

## 2017-09-06 DIAGNOSIS — J029 Acute pharyngitis, unspecified: Secondary | ICD-10-CM

## 2017-09-06 DIAGNOSIS — R0689 Other abnormalities of breathing: Secondary | ICD-10-CM

## 2017-09-06 DIAGNOSIS — J069 Acute upper respiratory infection, unspecified: Secondary | ICD-10-CM

## 2017-09-06 LAB — POCT I-STAT, CHEM 8
BUN: 19 mg/dL (ref 6–20)
CHLORIDE: 98 mmol/L — AB (ref 101–111)
Calcium, Ion: 1.1 mmol/L — ABNORMAL LOW (ref 1.15–1.40)
Creatinine, Ser: 1.4 mg/dL — ABNORMAL HIGH (ref 0.61–1.24)
Glucose, Bld: 110 mg/dL — ABNORMAL HIGH (ref 65–99)
HCT: 39 % (ref 39.0–52.0)
Hemoglobin: 13.3 g/dL (ref 13.0–17.0)
POTASSIUM: 4.4 mmol/L (ref 3.5–5.1)
SODIUM: 135 mmol/L (ref 135–145)
TCO2: 24 mmol/L (ref 22–32)

## 2017-09-06 MED ORDER — ALBUTEROL SULFATE (2.5 MG/3ML) 0.083% IN NEBU
2.5000 mg | INHALATION_SOLUTION | Freq: Once | RESPIRATORY_TRACT | Status: AC
  Administered 2017-09-06: 2.5 mg via RESPIRATORY_TRACT

## 2017-09-06 MED ORDER — PREDNISONE 20 MG PO TABS: ORAL_TABLET | ORAL | 0 refills | Status: AC

## 2017-09-06 MED ORDER — DOXYCYCLINE HYCLATE 100 MG PO CAPS: 100.0000 mg | ORAL_CAPSULE | Freq: Two times a day (BID) | ORAL | 0 refills | Status: AC

## 2017-09-06 MED ORDER — ALBUTEROL SULFATE (2.5 MG/3ML) 0.083% IN NEBU
INHALATION_SOLUTION | RESPIRATORY_TRACT | Status: AC
  Filled 2017-09-06: qty 3

## 2017-09-06 NOTE — ED Provider Notes (Addendum)
09/06/2017 2:30 PM   DOB: 1929-07-26 / MRN: [id number]  SUBJECTIVE:  Ryan [name] is a 84 y.o. male presenting for cough that began 3 days ago.  Patient associates sore throat and some mild nasal congestion.  No obvious exposures to the fluid however patient does have quite a few medical appointments and was at his doctor's office last week and tells me "everyone was wearing a mask."  His son works in the [employer] as a Copywriter, advertising and is accompanying him today.  Patient does have a history of asthma and his son tells me that time his father-in-law becomes ill his asthma tends to become involved in the illness.  He is allergic to ciprofloxacin; hydrochlorothiazide; sulfa antibiotics; ace inhibitors; losartan; carvedilol; and lisinopril.   He  has a past medical history of Age-related macular degeneration, Allergic rhinitis, Aortic valve stenosis, Arthritis, Asthma, Carotid artery obstruction, Chronic lower back pain, Coronary arteriosclerosis in native artery, Diverticulitis of colon, DJD (degenerative joint disease), Dyspnea, Dysthymia, GERD (gastroesophageal reflux disease), History of elevated PSA (05/2009), History of hiatal hernia, HLD (hyperlipidemia), Hypertension, Nocturnal hypoxemia, OSA (obstructive sleep apnea), Paroxysmal supraventricular tachycardia ([place]), Pneumonia, Presence of permanent cardiac pacemaker, Primary malignant neoplasm of bladder ([place]), and Prostate cancer ([place]).    He  reports that he has never smoked. He has never used smokeless tobacco. He reports that he does not drink alcohol or use drugs. He  reports that he does not engage in sexual activity. The patient  has a past surgical history that includes Carotid endarterectomy (Bilateral); Appendectomy; Cardiac catheterization (N/A, 03/21/2016); Tonsillectomy (~ 1947); Inguinal hernia repair (Right, 2009); Insertion prostate radiation seed; Cataract extraction w/ intraocular lens  implant, bilateral (Bilateral);  Cardiac catheterization (N/A, 01/17/2016); Cardiac catheterization (N/A, 04/30/2016); Colon surgery (1981); Transcatheter aortic valve replacement, transfemoral (N/A, 06/10/2016); and TEE without cardioversion (N/A, 06/10/2016).  His family history includes Heart disease in his father and mother.  Review of Systems  Constitutional: Positive for chills and fever. Negative for diaphoresis.  HENT: Positive for congestion and sore throat.   Respiratory: Positive for cough and sputum production. Negative for shortness of breath and wheezing.   Cardiovascular: Negative for chest pain.  Gastrointestinal: Negative for nausea and vomiting.  Genitourinary: Negative for dysuria.  Skin: Negative for rash.  Neurological: Negative for dizziness.    OBJECTIVE:  BP (!) 133/57   Pulse 76   Temp 98.7 F (37.1 C) (Oral)   Resp 18   SpO2 97%   Physical Exam  Constitutional: He appears well-developed. He is active and cooperative.  Non-toxic appearance.  Cardiovascular: Normal rate, regular rhythm, S1 normal, S2 normal, normal heart sounds, intact distal pulses and normal pulses. Exam reveals no gallop and no friction rub.  No murmur heard. Pulmonary/Chest: Effort normal. No tachypnea. He has no rales.  Abdominal: He exhibits no distension.  Musculoskeletal: He exhibits no edema.  Neurological: He is alert.  Skin: Skin is warm and dry. He is not diaphoretic. No pallor.  Vitals reviewed.   Results for orders placed or performed during the hospital encounter of 09/06/17 (from the past 72 hour(s))  I-STAT, chem 8     Status: Abnormal   Collection Time: 09/06/17  1:34 PM  Result Value Ref Range   Sodium 135 135 - 145 mmol/L   Potassium 4.4 3.5 - 5.1 mmol/L   Chloride 98 (L) 101 - 111 mmol/L   BUN 19 6 - 20 mg/dL   Creatinine, Ser 1.40 (H) 0.61 - 1.24 mg/dL  Glucose, Bld 110 (H) 65 - 99 mg/dL   Calcium, Ion 1.10 (L) 1.15 - 1.40 mmol/L   TCO2 24 22 - 32 mmol/L   Hemoglobin 13.3 13.0 - 17.0 g/dL   HCT  39.0 39.0 - 52.0 %   Lab Results  Component Value Date   CREATININE 1.40 (H) 09/06/2017   CREATININE 1.20 05/01/2017   CREATININE 1.11 12/05/2016      Dg Chest 2 View  Result Date: 09/06/2017 CLINICAL DATA:  Patient c/o cough some yellow mucus production. fever, and very fatigue x 3 days. Hx of pacemaker, aortic valve replacement and two stent placements, htn. EXAM: CHEST - 2 VIEW COMPARISON:  08/06/2017 FINDINGS: Cardiac silhouette is borderline enlarged. Changes from aortic valve replacement are stable. Left anterior chest wall sequential pacemaker is stable and well positioned. No mediastinal or hilar masses. No evidence of adenopathy. Clear lungs. No pleural effusion or pneumothorax. Skeletal structures are intact. IMPRESSION: No acute cardiopulmonary disease. Electronically Signed   By: Lajean Manes M.D.   On: 09/06/2017 13:42    ASSESSMENT AND PLAN:  Orders Placed This Encounter  Procedures  . DG Chest 2 View    Standing Status:   Standing    Number of Occurrences:   1    Order Specific Question:   Reason for Exam (SYMPTOM  OR DIAGNOSIS REQUIRED)    Answer:   Cough, fever, elderly.  . I-STAT, chem 8    Standing Status:   Standing    Number of Occurrences:   1     Acute URI: There was some improvement with albuterol.  This is most likely viral however I do not think I can rule out an early pneumonia with a high degree of accuracy.  We will start him on some doxycycline and have given prednisone to be filled with the patient is worsening in the next few days.  They have some nebulizers at home and have advised that the nebs every 8-12 hours  Adventitious breath sounds      The patient is advised to call or return to clinic if he does not see an improvement in symptoms, or to seek the care of the closest emergency department if he worsens with the above plan.   Philis Fendt, MHS, PA-C 09/06/2017 2:30 PM    Tereasa Coop, PA-C 09/06/17 1433    Tereasa Coop,  PA-C 09/06/17 1433

## 2017-09-06 NOTE — ED Triage Notes (Signed)
Per pt & family: started with cough 2 days ago.  Yesterday cough worse with coughing fits.  Now also having body aches, temp up to 100.5.

## 2017-09-06 NOTE — Discharge Instructions (Signed)
Start the doxycycline today.  This will cover for most community-acquired pneumonias.  It also has an anti-inflammatory effect.  I would suggest an albuterol nebulizer every 8-12 hours given his lung sounds that improved mildly with albuterol in the clinic.  He is worsening over the next few days is reasonable to start the prednisone.

## 2017-09-07 ENCOUNTER — Encounter: Payer: Self-pay | Admitting: Cardiology

## 2017-09-07 ENCOUNTER — Ambulatory Visit: Payer: Medicare Other | Admitting: Cardiology

## 2017-09-07 VITALS — BP 120/56 | HR 78 | Ht 66.0 in | Wt 142.2 lb

## 2017-09-07 DIAGNOSIS — I25119 Atherosclerotic heart disease of native coronary artery with unspecified angina pectoris: Secondary | ICD-10-CM

## 2017-09-07 DIAGNOSIS — R0600 Dyspnea, unspecified: Secondary | ICD-10-CM

## 2017-09-07 DIAGNOSIS — Z7901 Long term (current) use of anticoagulants: Secondary | ICD-10-CM

## 2017-09-07 DIAGNOSIS — R0609 Other forms of dyspnea: Secondary | ICD-10-CM | POA: Diagnosis not present

## 2017-09-07 DIAGNOSIS — I5032 Chronic diastolic (congestive) heart failure: Secondary | ICD-10-CM

## 2017-09-07 DIAGNOSIS — Z952 Presence of prosthetic heart valve: Secondary | ICD-10-CM | POA: Diagnosis not present

## 2017-09-07 DIAGNOSIS — R7989 Other specified abnormal findings of blood chemistry: Secondary | ICD-10-CM | POA: Diagnosis not present

## 2017-09-07 DIAGNOSIS — I480 Paroxysmal atrial fibrillation: Secondary | ICD-10-CM

## 2017-09-07 NOTE — Patient Instructions (Addendum)
Medication Instructions:  Your provider recommends that you continue on your current medications as directed. Please refer to the Current Medication list given to you today.    Labwork: Your provider recommends that you return for fasting labs a few days prior to your appointment with Dr. Meda Coffee.  Testing/Procedures: None  Follow-Up: You have an appointment scheduled with Dr. Meda Coffee Thursday, August 29 at 10:00AM.  Any Other Special Instructions Will Be Listed Below (If Applicable).     If you need a refill on your cardiac medications before your next appointment, please call your pharmacy.

## 2017-09-07 NOTE — Progress Notes (Signed)
Cardiology Office Note    Date:  09/07/2017   ID:  Ryan [name], DOB Oct 28, 1929, MRN [id number]  PCP:  Dorothyann Peng, NP  Cardiologist:   Ena Dawley, MD   Chief complain: 3 months follow-up  History of Present Illness:  Ryan [name] is a 84 y.o. male with a hx of carotid artery disease s/p bilateral CEA, CAD, aortic stenosis, HTN, HL, prior SVT, OSA, diastolic CHF, paroxysmal atrial fibrillation.  Echo in 5/17 demonstrated mod aortic stenosis with mean gradient 25 mmHg. In 8/17, he developed worsening dyspnea with exertion and fatigue. Cardiac catheterization demonstrated worsening CAD with 70% mid LCx, 80% OM1 and 80% mid RCA stenosis. PCI would be high risk. Medical therapy was recommended initially. He then saw Dr. Roxy Manns in surgical consultation. He was felt to have stage D severe symptomatic aortic stenosis. Work up for AVR vs TAVR was initiated.  He was then admitted in 10/17 with unstable angina in the setting of AF with RVR. This was complicated by tachybradycardia syndrome and he underwent pacemaker implantation with Dr. Lovena Le.   05/22/2016 - He returns for Cardiology follow up. In 11/17, he underwent successful rotational atherectomy and stenting of the RCA with a 3 x 12 mm Synergy DES on 04/30/16 by Dr Burt Knack. He was discharged on aspirin, Plavix and apixaban with plans to discontinue aspirin after 30 days. Apixaban to be stopped 5 days prior to TAVR. TAVR is scheduled for 06/11/2015. He states that he continues to have dyspnea on minimal exertion, dizziness with exertion. No syncope. He continues to have daily epistaxis.  07/23/2016 - 6 weeks post TAVR, he is procedure was uncomplicated, his postprocedural echo shows LVEF 50-55% with no central AI and only trivial paravalvular leak. The patient has been doing well, he started to walk about 20-30 minutes a day, he is going to start cardiac rehabilitation on March 1. He denies any palpitations, no dizziness, no syncope, no  orthopnea, no lower extremity edema. He's complaining is profound fatigue, lack of energy and easy bruising.  10/23/2016 - 3 months follow-up, the patient states that he continues to feel significantly tired, he exercises every day but has to take break and then has to take a nap after exercise. He is to exercise every day but his hospital course and procedure prolonged course and he'll lost his conditioning. He denies any chest pain shortness of breath, no palpitation no lower extremity edema. He has history of wheezing on a daily basis. He continues to bruise easily but nosebleeds have resolved.  11/24/2016 - the patient is coming after 1 months, he feels overall much stronger, he exercises in and out of the pool every day, he continues to experience post exertional fatigue that is slightly improved from a month ago. He has also been experiencing problems with balance. He is asking if we can discontinue helical S as he has easy bruising but otherwise no bleeding.  01/05/17 - 6 week follow up, we were contacted by Dr Loletha Carrow, his son in law in July that his father had an episode of presyncope, he was seen by Dr Sallyanne Kuster on 12/05/16, it was concluded that it was sec to orthostatic hypotension.  We interrogated his PM and found that he had an episode of a-fib with RVR, we increased cardizem CD to 180 mg po daily. He was instructed to stay hydrated and monitor BP for hypotension. He was explained that if he has more episodes, we might need to restart amiodarone (+ metoprolol )  again. The interrogation also showed A\a very brief episode of ectopic atrial tachycardia, possible atrial flutter with 2-1 AV conduction, but preceded his symptoms by at least 15 minutes and lasted for less than 3 seconds. Unlikely to have any relationship with this complaint. The patient also states that he has been profoundly tired with minimal activity and has chest pressure on exertion. This feels like before he got his stent placed. He  also has episodes of dizziness every day. We have gotten interrogation of his pacer today in the clinic he has had 9 episodes of rapid atrial fibrillation in the last months but the last episode was on July 31. He brings a diary and he had more episodes of dizziness and weakness on August 2nd, 5th, 6th. No syncope.  03/26/2017 -patient is coming after 2 months, he is accompanied by his wife both seem to be doing well. He just underwent cataract surgery with significant improvement in his vision and is scheduled to undergo cataract surgery next week. He continues to exercise, he walks 5 times a week with a walker for half an hour in the [facility]. He denies any falls he has occasional dizziness but no syncope. Denies palpitations. He has completed his amiodarone load and now is on 200 mg daily. He would prefer to take metoprolol just once daily. He has no side effects from his medications. He denies any chest pain.  06/29/2017 - patient is coming after 3 months, he continues to feel profoundly tired, now needs to take several naps during the day, when he exercises on a treadmill or running machine he actually feels better, but then has episodes when he just needs to lay down because he suddenly gets really tired. He denies any palpitations or syncope. He has also noticed wheezing and coughing. He has lifelong asthma but it seems to be worse right now.  09/07/2017 - 3 months follow up,  The patient went to the urgent care for concern of gastroenteritis fever chills and cough, he was diagnosed with viral bronchitis and started on doxycycline and albuterol nebulizer. He was seen by Dr. Lamonte Sakai who stated that he has subtle obstruction on spirometry with some resolution with albuterol, he also has mild interstitial lung disease on his chest CT and he concluded that changes are minor and he doesn't believe that there is indication to stop amiodarone.  The patient continues to have symptoms of fatigue, still able  to perform all activities of daily living denies any palpitations dizziness or syncope, no exertional chest pain, no lower extremity edema orthopnea or proximal nocturnal dyspnea.  Past Medical History:  Diagnosis Date  . Age-related macular degeneration   . Allergic rhinitis   . Aortic valve stenosis   . Arthritis    "hands, lower back" (04/30/2016)  . Asthma   . Carotid artery obstruction   . Chronic lower back pain   . Coronary arteriosclerosis in native artery   . Diverticulitis of colon   . DJD (degenerative joint disease)   . Dyspnea    "constant but the degree changes"  . Dysthymia   . GERD (gastroesophageal reflux disease)   . History of elevated PSA 05/2009  . History of hiatal hernia   . HLD (hyperlipidemia)   . Hypertension   . Nocturnal hypoxemia   . OSA (obstructive sleep apnea)    intolerant to CPAP  . Paroxysmal supraventricular tachycardia ([place])   . Pneumonia    "when I was a kid"  . Presence of  permanent cardiac pacemaker   . Primary malignant neoplasm of bladder ([place])   . Prostate cancer [facility])     Past Surgical History:  Procedure Laterality Date  . APPENDECTOMY    . CARDIAC CATHETERIZATION N/A 01/17/2016   Procedure: Right/Left Heart Cath and Coronary Angiography;  Surgeon: Belva Crome, MD;  Location: [place] [facility];  Service: Cardiovascular;  Laterality: N/A;  . CARDIAC CATHETERIZATION N/A 04/30/2016   Procedure: Coronary/Graft Atherectomy;  Surgeon: Sherren Mocha, MD;  Location: [place] [facility];  Service: Cardiovascular;  Laterality: N/A;  . CAROTID ENDARTERECTOMY Bilateral   . CATARACT EXTRACTION W/ INTRAOCULAR LENS  IMPLANT, BILATERAL Bilateral   . COLON SURGERY  1981   Meckles Diverticulum with volvulus  . EP IMPLANTABLE DEVICE N/A 03/21/2016   Procedure: Pacemaker Implant;  Surgeon: Evans Lance, MD;  Location: [place] [facility];  Service: Cardiovascular;  Laterality: N/A;  . INGUINAL HERNIA REPAIR Right 2009  . INSERTION PROSTATE  RADIATION SEED    . TEE WITHOUT CARDIOVERSION N/A 06/10/2016   Procedure: TRANSESOPHAGEAL ECHOCARDIOGRAM (TEE);  Surgeon: Sherren Mocha, MD;  Location: [place];  Service: Open Heart Surgery;  Laterality: N/A;  . TONSILLECTOMY  ~ 1947  . TRANSCATHETER AORTIC VALVE REPLACEMENT, TRANSFEMORAL N/A 06/10/2016   Procedure: TRANSCATHETER AORTIC VALVE REPLACEMENT, TRANSFEMORAL;  Surgeon: Sherren Mocha, MD;  Location: [place];  Service: Open Heart Surgery;  Laterality: N/A;   Current Medications: Outpatient Medications Prior to Visit  Medication Sig Dispense Refill  . acetaminophen (TYLENOL) 500 MG tablet Take 500 mg by mouth as needed for mild pain.     Marland Kitchen albuterol (2.5 MG/3ML) 0.083% NEBU 3 mL, albuterol (5 MG/ML) 0.5% NEBU 0.5 mL Inhale 5 mg into the lungs 2 (two) times daily at 10 am and 4 pm. PT'S WIFE ADVISED HE IS USING 1 TIME A DAY.    Marland Kitchen albuterol (PROVENTIL HFA;VENTOLIN HFA) 108 (90 Base) MCG/ACT inhaler Inhale 2 puffs into the lungs every 6 (six) hours as needed for wheezing or shortness of breath. 1 Inhaler 2  . amiodarone (PACERONE) 200 MG tablet Take 200 mg (one tablet) by mouth once daily Mon-Sat.  Do not take on Sunday. 90 tablet 3  . amLODipine (NORVASC) 5 MG tablet Take 1 tablet (5 mg total) by mouth daily. 90 tablet 2  . atorvastatin (LIPITOR) 20 MG tablet Take 1 tablet (20 mg total) by mouth daily. 90 tablet 3  . cholecalciferol (VITAMIN D) 1000 units tablet Take 1,000 Units by mouth 2 (two) times a week.     . Coenzyme Q10 (COQ-10 PO) Take 1 capsule by mouth 2 (two) times a week.     . Cyanocobalamin (VITAMIN B 12 PO) Take 1 capsule by mouth daily.    Marland Kitchen doxycycline (VIBRAMYCIN) 100 MG capsule Take 1 capsule (100 mg total) by mouth 2 (two) times daily for 10 days. 20 capsule 0  . ELIQUIS 2.5 MG TABS tablet TAKE 1 TABLET(2.5 MG) BY MOUTH TWICE DAILY 60 tablet 5  . latanoprost (XALATAN) 0.005 % ophthalmic solution Place 1 drop into both eyes at bedtime.    Marland Kitchen levothyroxine (SYNTHROID,  LEVOTHROID) 25 MCG tablet Take 0.5 tablets (12.5 mcg total) by mouth daily before breakfast. 30 tablet 1  . metoprolol succinate (TOPROL XL) 25 MG 24 hr tablet Take 1 tablet (25 mg total) by mouth daily. 30 tablet 11  . nitroGLYCERIN (NITROSTAT) 0.4 MG SL tablet Place 1 tablet (0.4 mg total) under the tongue every 5 (five) minutes as needed.  25 tablet 3  . predniSONE (DELTASONE) 20 MG tablet Take two tabs daily for five days. 10 tablet 0   No facility-administered medications prior to visit.      Allergies:   Ciprofloxacin; Hydrochlorothiazide; Sulfa antibiotics; Ace inhibitors; Losartan; Carvedilol; and Lisinopril   Social History   Socioeconomic History  . Marital status: Married    Spouse name: Not on file  . Number of children: Not on file  . Years of education: Not on file  . Highest education level: Not on file  Occupational History  . Occupation: retired  Scientific laboratory technician  . Financial resource strain: Not on file  . Food insecurity:    Worry: Not on file    Inability: Not on file  . Transportation needs:    Medical: Not on file    Non-medical: Not on file  Tobacco Use  . Smoking status: Never Smoker  . Smokeless tobacco: Never Used  Substance and Sexual Activity  . Alcohol use: No    Alcohol/week: 0.0 oz    Comment: 04/30/2016 "nothing in years"  . Drug use: No  . Sexual activity: Never  Lifestyle  . Physical activity:    Days per week: Not on file    Minutes per session: Not on file  . Stress: Not on file  Relationships  . Social connections:    Talks on phone: Not on file    Gets together: Not on file    Attends religious service: Not on file    Active member of club or organization: Not on file    Attends meetings of clubs or organizations: Not on file    Relationship status: Not on file  Other Topics Concern  . Not on file  Social History Narrative   Retired    Three children - One in [place], One in [place], and one in [place].        Family History:  The  patient's family history includes Heart disease in his father and mother.   ROS:   Please see the history of present illness.    ROS All other systems reviewed and are negative.  PHYSICAL EXAM:   VS:  BP (!) 120/56   Pulse 78   Ht 5\' 6"  (1.676 m)   Wt 142 lb 3.2 oz (64.5 kg)   SpO2 95%   BMI 22.95 kg/m    GEN: Well nourished, well developed, in no acute distress  HEENT: normal  Neck: no JVD, carotid bruits, or masses Cardiac: RRR; 2/6 systolic murmurs, rubs, or gallops,no edema  Respiratory:  clear to auscultation bilaterally, normal work of breathing GI: soft, nontender, nondistended, + BS MS: no deformity or atrophy  Skin: warm and dry, no rash Neuro:  Alert and Oriented x 3, Strength and sensation are intact Psych: euthymic mood, full affect  Wt Readings from Last 3 Encounters:  09/07/17 142 lb 3.2 oz (64.5 kg)  09/01/17 140 lb (63.5 kg)  08/26/17 142 lb (64.4 kg)    Studies/Labs Reviewed:   EKG:  Was personally reviewed and shows Atrial pacing, LBBB, unchanged from prior  Recent Labs: 05/01/2017: ALT 20; Platelets 167.0 08/11/2017: TSH 3.560 09/06/2017: BUN 19; Creatinine, Ser 1.40; Hemoglobin 13.3; Potassium 4.4; Sodium 135   Lipid Panel    Component Value Date/Time   CHOL 130 05/01/2017 0827   TRIG 70.0 05/01/2017 0827   HDL 56.00 05/01/2017 0827   CHOLHDL 2 05/01/2017 0827   VLDL 14.0 05/01/2017 0827   LDLCALC 60 05/01/2017 0827  Additional studies/ records that were reviewed today include:   - Left ventricle: The cavity size was normal. Wall thickness was   increased in a pattern of mild LVH. Systolic function was normal.   The estimated ejection fraction was in the range of 50% to 55%.   Doppler parameters are consistent with abnormal left ventricular   relaxation (grade 1 diastolic dysfunction). - Aortic valve: Post TAVR with trivial periprosthetic   regurgitation. - Aorta: Post TAVR with stable gradients and only trivial   perivalvular  regurgitation. - Mitral valve: There was mild regurgitation. - Left atrium: The atrium was moderately dilated. - Atrial septum: No defect or patent foramen ovale was identified.   ASSESSMENT:    1. Chronic diastolic CHF (congestive heart failure) ([place])   2. Coronary artery disease involving native coronary artery of native heart with angina pectoris ([place])   3. PAF (paroxysmal atrial fibrillation) ([place])   4. S/P TAVR (transcatheter aortic valve replacement)   5. Elevated TSH   6. DOE (dyspnea on exertion)   7. Chronic anticoagulation      PLAN:  In order of problems listed above:  1. Fatigue combination of age and the morbidity, with CAD, recent aVR, pacemaker placement, paroxysmal A. Fib on amiodarone, previously abnormal TSH. The patient is advised to continue exercising or walking as much as he can, no significant culprit was found.  2. Acute bronchitis - not wheezing anymore, advised not to start taking prednisone, continue albuterol and doxycycline.  3. CAD, he underwent successful rotational atherectomy and stenting of the RCA with a 3 x 12 mm Synergy DES on 04/30/16 by Dr Burt Knack. Residual disease will be treated medically (Mid Cx to Dist Cx lesion, 70% stenosed. The lesion is type C. The lesion is calcified). Because of shortness of breath he underwent a pharmacologic nuclear stress test that was negative for ischemia. He continues to exercise. He is not on aspirin as he is taking Eliquis.   4. Paroxysmal atrial fibrillation with rapid ventricular response - continue amiodarone, see about results from pulmonary checkup, he will need periodic CT chest to monitor interstitial lung disease, most recent TSH within normal limits, we'll continue low-dose of Synthroid 12.5 g daily.hemoglobin stable on Eliquis.  5. Aortic stenosis -  status post TAVR on 06/11/2015, with great results normal transaortic gradients 12 mmHg in 05/2017 and trivial paravalvular leak only.   6. Chronic diastolic  CHF - Volume appears stable.   7. HL - Continue statin.   8. PPM - his Medtronic dual-chamber pacemaker is working normally. Followed by Dr. Lovena Le.  9. Hypertension - controlled  Medication Adjustments/Labs and Tests Ordered: Current medicines are reviewed at length with the patient today.  Concerns regarding medicines are outlined above.  Medication changes, Labs and Tests ordered today are listed in the Patient Instructions below. Patient Instructions  Medication Instructions:  Your provider recommends that you continue on your current medications as directed. Please refer to the Current Medication list given to you today.    Labwork: Your provider recommends that you return for fasting labs a few days prior to your appointment with Dr. Meda Coffee.  Testing/Procedures: None  Follow-Up: You have an appointment scheduled with Dr. Meda Coffee Thursday, August 29 at 10:00AM.  Any Other Special Instructions Will Be Listed Below (If Applicable).     If you need a refill on your cardiac medications before your next appointment, please call your pharmacy.      Signed, Ena Dawley, MD  09/07/2017 3:42 PM  [facility] [address], [place], [place]  [address] Phone: [phone]; Fax: [phone]

## 2017-09-09 NOTE — Addendum Note (Signed)
Addended by: Mendel Ryder on: 09/09/2017 08:01 AM   Modules accepted: Orders

## 2017-09-11 ENCOUNTER — Ambulatory Visit: Payer: Medicare Other | Admitting: Adult Health

## 2017-09-11 ENCOUNTER — Encounter: Payer: Self-pay | Admitting: Adult Health

## 2017-09-11 VITALS — BP 106/56 | Temp 98.0°F | Wt 143.0 lb

## 2017-09-11 DIAGNOSIS — J40 Bronchitis, not specified as acute or chronic: Secondary | ICD-10-CM

## 2017-09-11 MED ORDER — PREDNISONE 20 MG PO TABS: 40.0000 mg | ORAL_TABLET | Freq: Every day | ORAL | 0 refills | Status: AC

## 2017-09-11 MED ORDER — ALBUTEROL SULFATE (2.5 MG/3ML) 0.083% IN NEBU: 2.5000 mg | INHALATION_SOLUTION | Freq: Three times a day (TID) | RESPIRATORY_TRACT | 0 refills | Status: DC | PRN

## 2017-09-11 MED ORDER — BUDESONIDE 0.5 MG/2ML IN SUSP: 0.5000 mg | Freq: Three times a day (TID) | RESPIRATORY_TRACT | 0 refills | Status: DC | PRN

## 2017-09-11 NOTE — Progress Notes (Signed)
Subjective:    Patient ID: Ryan [name], male    DOB: 1929/11/04, 84 y.o.   MRN: [id number]  HPI 84 year old male who  has a past medical history of Age-related macular degeneration, Allergic rhinitis, Aortic valve stenosis, Arthritis, Asthma, Carotid artery obstruction, Chronic lower back pain, Coronary arteriosclerosis in native artery, Diverticulitis of colon, DJD (degenerative joint disease), Dyspnea, Dysthymia, GERD (gastroesophageal reflux disease), History of elevated PSA (05/2009), History of hiatal hernia, HLD (hyperlipidemia), Hypertension, Nocturnal hypoxemia, OSA (obstructive sleep apnea), Paroxysmal supraventricular tachycardia ([place]), Pneumonia, Presence of permanent cardiac pacemaker, Primary malignant neoplasm of bladder ([place]), and Prostate cancer ([place]).  He presents to the office today for follow up after being seen in Urgent Care 5 days ago. He was diagnosed with bronchitis after chest xray was normal. He was prescribed doxycycline and prednisone.  Today in the clinic he reports that he has not started feeling any better. He has been using home nebulizer ( improvement for a short time), inhalers ( no improvement), finished his antibiotics but has not started his prednisone yet. He continues to have a continuous cough that is productive with yellow mucus at times. Denies any recent fevers. Continues to have wheezing, especially at night.   Review of Systems See HPI   Past Medical History:  Diagnosis Date  . Age-related macular degeneration   . Allergic rhinitis   . Aortic valve stenosis   . Arthritis    "hands, lower back" (04/30/2016)  . Asthma   . Carotid artery obstruction   . Chronic lower back pain   . Coronary arteriosclerosis in native artery   . Diverticulitis of colon   . DJD (degenerative joint disease)   . Dyspnea    "constant but the degree changes"  . Dysthymia   . GERD (gastroesophageal reflux disease)   . History of elevated PSA 05/2009  . History  of hiatal hernia   . HLD (hyperlipidemia)   . Hypertension   . Nocturnal hypoxemia   . OSA (obstructive sleep apnea)    intolerant to CPAP  . Paroxysmal supraventricular tachycardia ([place])   . Pneumonia    "when I was a kid"  . Presence of permanent cardiac pacemaker   . Primary malignant neoplasm of bladder ([place])   . Prostate cancer [facility])     Social History   Socioeconomic History  . Marital status: Married    Spouse name: Not on file  . Number of children: Not on file  . Years of education: Not on file  . Highest education level: Not on file  Occupational History  . Occupation: retired  Scientific laboratory technician  . Financial resource strain: Not on file  . Food insecurity:    Worry: Not on file    Inability: Not on file  . Transportation needs:    Medical: Not on file    Non-medical: Not on file  Tobacco Use  . Smoking status: Never Smoker  . Smokeless tobacco: Never Used  Substance and Sexual Activity  . Alcohol use: No    Alcohol/week: 0.0 oz    Comment: 04/30/2016 "nothing in years"  . Drug use: No  . Sexual activity: Never  Lifestyle  . Physical activity:    Days per week: Not on file    Minutes per session: Not on file  . Stress: Not on file  Relationships  . Social connections:    Talks on phone: Not on file    Gets together: Not on file  Attends religious service: Not on file    Active member of club or organization: Not on file    Attends meetings of clubs or organizations: Not on file    Relationship status: Not on file  . Intimate partner violence:    Fear of current or ex partner: Not on file    Emotionally abused: Not on file    Physically abused: Not on file    Forced sexual activity: Not on file  Other Topics Concern  . Not on file  Social History Narrative   Retired    Three children - One in [place], One in [place], and one in [place].        Past Surgical History:  Procedure Laterality Date  . APPENDECTOMY    . CARDIAC CATHETERIZATION N/A  01/17/2016   Procedure: Right/Left Heart Cath and Coronary Angiography;  Surgeon: Belva Crome, MD;  Location: [facility];  Service: Cardiovascular;  Laterality: N/A;  . CARDIAC CATHETERIZATION N/A 04/30/2016   Procedure: Coronary/Graft Atherectomy;  Surgeon: Sherren Mocha, MD;  Location: [facility];  Service: Cardiovascular;  Laterality: N/A;  . CAROTID ENDARTERECTOMY Bilateral   . CATARACT EXTRACTION W/ INTRAOCULAR LENS  IMPLANT, BILATERAL Bilateral   . COLON SURGERY  1981   Meckles Diverticulum with volvulus  . EP IMPLANTABLE DEVICE N/A 03/21/2016   Procedure: Pacemaker Implant;  Surgeon: Evans Lance, MD;  Location: [facility];  Service: Cardiovascular;  Laterality: N/A;  . INGUINAL HERNIA REPAIR Right 2009  . INSERTION PROSTATE RADIATION SEED    . TEE WITHOUT CARDIOVERSION N/A 06/10/2016   Procedure: TRANSESOPHAGEAL ECHOCARDIOGRAM (TEE);  Surgeon: Sherren Mocha, MD;  Location: [facility];  Service: Open Heart Surgery;  Laterality: N/A;  . TONSILLECTOMY  ~ 1947  . TRANSCATHETER AORTIC VALVE REPLACEMENT, TRANSFEMORAL N/A 06/10/2016   Procedure: TRANSCATHETER AORTIC VALVE REPLACEMENT, TRANSFEMORAL;  Surgeon: Sherren Mocha, MD;  Location: [facility];  Service: Open Heart Surgery;  Laterality: N/A;    Family History  Problem Relation Age of Onset  . Heart disease Mother   . Heart disease Father     Allergies  Allergen Reactions  . Ciprofloxacin Anaphylaxis  . Hydrochlorothiazide Anaphylaxis  . Sulfa Antibiotics Anaphylaxis  . Ace Inhibitors Cough  . Losartan Cough  . Carvedilol Cough    Pt states it "causes him too cough."  . Lisinopril Cough    Pt reports worsening cough and "feeling wheezy."    Current Outpatient Medications on File Prior to Visit  Medication Sig Dispense Refill  . acetaminophen (TYLENOL) 500 MG tablet Take 500 mg by mouth as needed for mild pain.     Marland Kitchen amiodarone (PACERONE) 200 MG tablet Take 200 mg (one tablet) by mouth once daily Mon-Sat.   Do not take on Sunday. 90 tablet 3  . amLODipine (NORVASC) 5 MG tablet Take 1 tablet (5 mg total) by mouth daily. 90 tablet 2  . atorvastatin (LIPITOR) 20 MG tablet Take 1 tablet (20 mg total) by mouth daily. 90 tablet 3  . cholecalciferol (VITAMIN D) 1000 units tablet Take 1,000 Units by mouth 2 (two) times a week.     . Coenzyme Q10 (COQ-10 PO) Take 1 capsule by mouth 2 (two) times a week.     . Cyanocobalamin (VITAMIN B 12 PO) Take 1 capsule by mouth daily.    Marland Kitchen doxycycline (VIBRAMYCIN) 100 MG capsule Take 1 capsule (100 mg total) by mouth 2 (two) times daily for 10 days. 20 capsule 0  .  ELIQUIS 2.5 MG TABS tablet TAKE 1 TABLET(2.5 MG) BY MOUTH TWICE DAILY 60 tablet 5  . latanoprost (XALATAN) 0.005 % ophthalmic solution Place 1 drop into both eyes at bedtime.    Marland Kitchen levothyroxine (SYNTHROID, LEVOTHROID) 25 MCG tablet Take 0.5 tablets (12.5 mcg total) by mouth daily before breakfast. 30 tablet 1  . metoprolol succinate (TOPROL XL) 25 MG 24 hr tablet Take 1 tablet (25 mg total) by mouth daily. 30 tablet 11  . nitroGLYCERIN (NITROSTAT) 0.4 MG SL tablet Place 1 tablet (0.4 mg total) under the tongue every 5 (five) minutes as needed. 25 tablet 3  . predniSONE (DELTASONE) 20 MG tablet Take two tabs daily for five days. (Patient not taking: Reported on 09/11/2017) 10 tablet 0   No current facility-administered medications on file prior to visit.     BP (!) 106/56   Temp 98 F (36.7 C) (Oral)   Wt 143 lb (64.9 kg)   BMI 23.08 kg/m       Objective:   Physical Exam  Constitutional: He is oriented to person, place, and time. He appears well-developed and well-nourished. No distress.  Cardiovascular: Normal rate, regular rhythm, normal heart sounds and intact distal pulses. Exam reveals no gallop and no friction rub.  No murmur heard. Pulmonary/Chest: Effort normal. No respiratory distress. He has wheezes in the right upper field, the right middle field, the right lower field, the left upper  field, the left middle field and the left lower field. He has no rhonchi. He has no rales. He exhibits no mass and no tenderness.  Neurological: He is alert and oriented to person, place, and time.  Skin: Skin is warm and dry. No rash noted. He is not diaphoretic. No erythema. No pallor.  Psychiatric: He has a normal mood and affect. His behavior is normal. Judgment and thought content normal.  Nursing note and vitals reviewed.     Assessment & Plan:  1. Bronchitis - Improvement with nebulizer. They are out, will refill for short time. Advised to take prednisone as directed. Follow up as needed  - budesonide (PULMICORT) 0.5 MG/2ML nebulizer solution; Take 2 mLs (0.5 mg total) by nebulization every 8 (eight) hours as needed.  Dispense: 50 mL; Refill: 0 - predniSONE (DELTASONE) 20 MG tablet; Take 2 tablets (40 mg total) by mouth daily with breakfast for 5 days.  Dispense: 10 tablet; Refill: 0 - albuterol (PROVENTIL) (2.5 MG/3ML) 0.083% nebulizer solution; Take 3 mLs (2.5 mg total) by nebulization every 8 (eight) hours as needed for wheezing or shortness of breath.  Dispense: 50 mL; Refill: 0   Dorothyann Peng, NP

## 2017-09-24 ENCOUNTER — Other Ambulatory Visit: Payer: Self-pay | Admitting: Cardiology

## 2017-09-25 ENCOUNTER — Other Ambulatory Visit: Payer: Self-pay | Admitting: Cardiology

## 2017-09-28 ENCOUNTER — Telehealth: Payer: Self-pay | Admitting: *Deleted

## 2017-09-28 NOTE — Telephone Encounter (Signed)
Sending Rx for Eliquis 2.5mg  BID.

## 2017-09-28 NOTE — Telephone Encounter (Signed)
-----   Message from Dorothy Spark, MD sent at 09/26/2017  8:13 AM EDT ----- Regarding: RE: Eliquis dosage  I would keep at 2.5 mg PO BID  ----- Message ----- From: Zenovia Jarred, RN Sent: 09/24/2017  11:07 AM To: Dorothy Spark, MD Subject: Eliquis dosage                                 Pt is a 84 yr old male, last weight when he had an office visit with you on 09/07/17 was 64.5Kg. Last serum creatine was 1.4 from hospital admission on 09/06/17 but previous creatines have been 1.1's. Since he only meets one of the criteria, should we increase him to the Eliquis 5mg  BID? He has been on 2.5mg  BID for a while? Please advise, Thank you.  Drue Camera

## 2017-09-30 DIAGNOSIS — H401222 Low-tension glaucoma, left eye, moderate stage: Secondary | ICD-10-CM | POA: Diagnosis not present

## 2017-09-30 DIAGNOSIS — H401213 Low-tension glaucoma, right eye, severe stage: Secondary | ICD-10-CM | POA: Diagnosis not present

## 2017-09-30 DIAGNOSIS — H18003 Unspecified corneal deposit, bilateral: Secondary | ICD-10-CM | POA: Diagnosis not present

## 2017-09-30 DIAGNOSIS — H26493 Other secondary cataract, bilateral: Secondary | ICD-10-CM | POA: Diagnosis not present

## 2017-10-12 ENCOUNTER — Ambulatory Visit (INDEPENDENT_AMBULATORY_CARE_PROVIDER_SITE_OTHER): Payer: Medicare Other | Admitting: *Deleted

## 2017-10-12 DIAGNOSIS — I5032 Chronic diastolic (congestive) heart failure: Secondary | ICD-10-CM

## 2017-10-12 DIAGNOSIS — I495 Sick sinus syndrome: Secondary | ICD-10-CM

## 2017-10-12 NOTE — Progress Notes (Signed)
Remote pacemaker transmission.   

## 2017-10-13 ENCOUNTER — Encounter: Payer: Self-pay | Admitting: Adult Health

## 2017-10-13 ENCOUNTER — Ambulatory Visit: Payer: Self-pay | Admitting: *Deleted

## 2017-10-13 ENCOUNTER — Ambulatory Visit: Payer: Medicare Other | Admitting: Adult Health

## 2017-10-13 VITALS — BP 124/60 | HR 79 | Temp 98.2°F | Wt 142.0 lb

## 2017-10-13 DIAGNOSIS — J42 Unspecified chronic bronchitis: Secondary | ICD-10-CM | POA: Diagnosis not present

## 2017-10-13 DIAGNOSIS — G8929 Other chronic pain: Secondary | ICD-10-CM | POA: Diagnosis not present

## 2017-10-13 DIAGNOSIS — J40 Bronchitis, not specified as acute or chronic: Secondary | ICD-10-CM

## 2017-10-13 DIAGNOSIS — M545 Low back pain, unspecified: Secondary | ICD-10-CM

## 2017-10-13 LAB — CUP PACEART REMOTE DEVICE CHECK
Brady Statistic AP VS Percent: 96 %
Brady Statistic AS VP Percent: 0 %
Brady Statistic AS VS Percent: 4 %
Date Time Interrogation Session: 20190513143838
Implantable Lead Implant Date: 20171020
Implantable Lead Location: 753859
Implantable Lead Location: 753860
Lead Channel Impedance Value: 607 Ohm
Lead Channel Pacing Threshold Amplitude: 0.75 V
Lead Channel Pacing Threshold Pulse Width: 0.4 ms
Lead Channel Pacing Threshold Pulse Width: 0.4 ms
Lead Channel Sensing Intrinsic Amplitude: 8 mV
Lead Channel Setting Pacing Amplitude: 2.5 V
Lead Channel Setting Sensing Sensitivity: 4 mV
MDC IDC LEAD IMPLANT DT: 20171020
MDC IDC MSMT BATTERY IMPEDANCE: 162 Ohm
MDC IDC MSMT BATTERY REMAINING LONGEVITY: 124 mo
MDC IDC MSMT BATTERY VOLTAGE: 2.79 V
MDC IDC MSMT LEADCHNL RA IMPEDANCE VALUE: 390 Ohm
MDC IDC MSMT LEADCHNL RA PACING THRESHOLD AMPLITUDE: 0.5 V
MDC IDC PG IMPLANT DT: 20171020
MDC IDC SET LEADCHNL RA PACING AMPLITUDE: 2 V
MDC IDC SET LEADCHNL RV PACING PULSEWIDTH: 0.4 ms
MDC IDC STAT BRADY AP VP PERCENT: 0 %

## 2017-10-13 MED ORDER — ACETAMINOPHEN-CODEINE #3 300-30 MG PO TABS: 1.0000 | ORAL_TABLET | Freq: Four times a day (QID) | ORAL | 0 refills | Status: DC | PRN

## 2017-10-13 MED ORDER — ALBUTEROL SULFATE (2.5 MG/3ML) 0.083% IN NEBU: 2.5000 mg | INHALATION_SOLUTION | Freq: Three times a day (TID) | RESPIRATORY_TRACT | 0 refills | Status: DC | PRN

## 2017-10-13 NOTE — Telephone Encounter (Signed)
Pt's wife stating the patient is weak this morning. Can use a walker to get around. She states he is not feeling well. He still has the cough from having bronchitis and some shortness of breath with it but has his nebulizer as needed.  An appointment made for today with his provider. She is advised to call back if symptoms increase. Wife voiced understanding. Will route to flow at [facility] at [place].  Reason for Disposition . [1] MODERATE weakness (i.e., interferes with work, school, normal activities) AND [2] cause unknown  (Exceptions: weakness with acute minor illness, or weakness from poor fluid intake)  Answer Assessment - Initial Assessment Questions 1. DESCRIPTION: "Describe how you are feeling."     Not feeling well 2. SEVERITY: "How bad is it?"  "Can you stand and walk?"   - MILD - Feels weak or tired, but does not interfere with work, school or normal activities   - [place] to stand and walk; weakness interferes with work, school, or normal activities   - SEVERE - Unable to stand or walk     moderate 3. ONSET:  "When did the weakness begin?"     This morning 4. CAUSE: "What do you think is causing the weakness?"     Not sure 5. MEDICINES: "Have you recently started a new medicine or had a change in the amount of a medicine?"     Just ventolin spray as needed 6. OTHER SYMPTOMS: "Do you have any other symptoms?" (e.g., chest pain, fever, cough, SOB, vomiting, diarrhea, bleeding)     Cough, sob, upset stomach 7. PREGNANCY: "Is there any chance you are pregnant?" "When was your last menstrual period?"     no  Protocols used: WEAKNESS (GENERALIZED) AND FATIGUE-A-AH

## 2017-10-13 NOTE — Progress Notes (Signed)
Subjective:    Patient ID: Ryan [name], male    DOB: [birth year], 84 y.o.   MRN: [id number]  HPI  84 year old male who  has a past medical history of Age-related macular degeneration, Allergic rhinitis, Aortic valve stenosis, Arthritis, Asthma, Carotid artery obstruction, Chronic lower back pain, Coronary arteriosclerosis in native artery, Diverticulitis of colon, DJD (degenerative joint disease), Dyspnea, Dysthymia, GERD (gastroesophageal reflux disease), History of elevated PSA (05/2009), History of hiatal hernia, HLD (hyperlipidemia), Hypertension, Nocturnal hypoxemia, OSA (obstructive sleep apnea), Paroxysmal supraventricular tachycardia ([place]), Pneumonia, Presence of permanent cardiac pacemaker, Primary malignant neoplasm of bladder ([place]), and Prostate cancer ([place]).  He presents to the office today with multiple complaints.   1 .  Cough.  Was diagnosed with bronchitis about a month ago and was treated with nebulizers, doxycycline, and eventually Tylenol with codeine to help with cough suppressant.  Reports that he did well with this but a couple weeks ago his symptoms returned.  Reports a semi-productive cough with white sputum, wheezing, shortness of breath.  Denies any fevers or fatigue past baseline.  2.  Back pain.  This is a chronic issue these had low back pain for 20+ years feels as though over the last six months has been getting progressively worse.  Pain is described as "sharp and stabbing".  Pain is worse in the morning when he gets out of bed and when changing positions such as from sitting to standing.  Does report some resolution in pain as the day progresses.  Denies any loss of sensation in his lower extremities or issues with bowel or bladder.  He has not had any falls but he often feels off balance.  Review of Systems  Constitutional: Positive for activity change and fatigue.  Respiratory: Positive for cough (semi productive ), shortness of breath and wheezing.     Cardiovascular: Negative.   Gastrointestinal: Negative.   Musculoskeletal: Positive for arthralgias, back pain and gait problem.  Skin: Negative.   Neurological: Positive for weakness. Negative for facial asymmetry, speech difficulty, light-headedness and numbness.  Psychiatric/Behavioral: Negative.   All other systems reviewed and are negative.  Past Medical History:  Diagnosis Date  . Age-related macular degeneration   . Allergic rhinitis   . Aortic valve stenosis   . Arthritis    "hands, lower back" (04/30/2016)  . Asthma   . Carotid artery obstruction   . Chronic lower back pain   . Coronary arteriosclerosis in native artery   . Diverticulitis of colon   . DJD (degenerative joint disease)   . Dyspnea    "constant but the degree changes"  . Dysthymia   . GERD (gastroesophageal reflux disease)   . History of elevated PSA 05/2009  . History of hiatal hernia   . HLD (hyperlipidemia)   . Hypertension   . Nocturnal hypoxemia   . OSA (obstructive sleep apnea)    intolerant to CPAP  . Paroxysmal supraventricular tachycardia ([place])   . Pneumonia    "when I was a kid"  . Presence of permanent cardiac pacemaker   . Primary malignant neoplasm of bladder ([place])   . Prostate cancer [facility])     Social History   Socioeconomic History  . Marital status: Married    Spouse name: Not on file  . Number of children: Not on file  . Years of education: Not on file  . Highest education level: Not on file  Occupational History  . Occupation: retired  Scientific laboratory technician  . Financial  resource strain: Not on file  . Food insecurity:    Worry: Not on file    Inability: Not on file  . Transportation needs:    Medical: Not on file    Non-medical: Not on file  Tobacco Use  . Smoking status: Never Smoker  . Smokeless tobacco: Never Used  Substance and Sexual Activity  . Alcohol use: No    Alcohol/week: 0.0 oz    Comment: 04/30/2016 "nothing in years"  . Drug use: No  . Sexual activity:  Never  Lifestyle  . Physical activity:    Days per week: Not on file    Minutes per session: Not on file  . Stress: Not on file  Relationships  . Social connections:    Talks on phone: Not on file    Gets together: Not on file    Attends religious service: Not on file    Active member of club or organization: Not on file    Attends meetings of clubs or organizations: Not on file    Relationship status: Not on file  . Intimate partner violence:    Fear of current or ex partner: Not on file    Emotionally abused: Not on file    Physically abused: Not on file    Forced sexual activity: Not on file  Other Topics Concern  . Not on file  Social History Narrative   Retired    Three children - One in [place], One in [place], and one in [place].        Past Surgical History:  Procedure Laterality Date  . APPENDECTOMY    . CARDIAC CATHETERIZATION N/A 01/17/2016   Procedure: Right/Left Heart Cath and Coronary Angiography;  Surgeon: Belva Crome, MD;  Location: [facility];  Service: Cardiovascular;  Laterality: N/A;  . CARDIAC CATHETERIZATION N/A 04/30/2016   Procedure: Coronary/Graft Atherectomy;  Surgeon: Sherren Mocha, MD;  Location: [facility];  Service: Cardiovascular;  Laterality: N/A;  . CAROTID ENDARTERECTOMY Bilateral   . CATARACT EXTRACTION W/ INTRAOCULAR LENS  IMPLANT, BILATERAL Bilateral   . COLON SURGERY  1981   Meckles Diverticulum with volvulus  . EP IMPLANTABLE DEVICE N/A 03/21/2016   Procedure: Pacemaker Implant;  Surgeon: Evans Lance, MD;  Location: [facility];  Service: Cardiovascular;  Laterality: N/A;  . INGUINAL HERNIA REPAIR Right 2009  . INSERTION PROSTATE RADIATION SEED    . TEE WITHOUT CARDIOVERSION N/A 06/10/2016   Procedure: TRANSESOPHAGEAL ECHOCARDIOGRAM (TEE);  Surgeon: Sherren Mocha, MD;  Location: [facility];  Service: Open Heart Surgery;  Laterality: N/A;  . TONSILLECTOMY  ~ 1947  . TRANSCATHETER AORTIC VALVE REPLACEMENT,  TRANSFEMORAL N/A 06/10/2016   Procedure: TRANSCATHETER AORTIC VALVE REPLACEMENT, TRANSFEMORAL;  Surgeon: Sherren Mocha, MD;  Location: [facility];  Service: Open Heart Surgery;  Laterality: N/A;    Family History  Problem Relation Age of Onset  . Heart disease Mother   . Heart disease Father     Allergies  Allergen Reactions  . Ciprofloxacin Anaphylaxis  . Hydrochlorothiazide Anaphylaxis  . Sulfa Antibiotics Anaphylaxis  . Ace Inhibitors Cough  . Losartan Cough  . Carvedilol Cough    Pt states it "causes him too cough."  . Lisinopril Cough    Pt reports worsening cough and "feeling wheezy."    Current Outpatient Medications on File Prior to Visit  Medication Sig Dispense Refill  . acetaminophen (TYLENOL) 500 MG tablet Take 500 mg by mouth as needed for mild pain.     Marland Kitchen  amiodarone (PACERONE) 200 MG tablet Take 200 mg (one tablet) by mouth once daily Mon-Sat.  Do not take on Sunday. 90 tablet 3  . amLODipine (NORVASC) 5 MG tablet Take 1 tablet (5 mg total) by mouth daily. 90 tablet 2  . atorvastatin (LIPITOR) 20 MG tablet Take 1 tablet (20 mg total) by mouth daily. 90 tablet 3  . budesonide (PULMICORT) 0.5 MG/2ML nebulizer solution Take 2 mLs (0.5 mg total) by nebulization every 8 (eight) hours as needed. 50 mL 0  . cholecalciferol (VITAMIN D) 1000 units tablet Take 1,000 Units by mouth 2 (two) times a week.     . Coenzyme Q10 (COQ-10 PO) Take 1 capsule by mouth 2 (two) times a week.     . Cyanocobalamin (VITAMIN B 12 PO) Take 1 capsule by mouth daily.    Marland Kitchen ELIQUIS 2.5 MG TABS tablet TAKE 1 TABLET BY MOUTH TWICE DAILY 60 tablet 10  . ELIQUIS 2.5 MG TABS tablet TAKE 1 TABLET BY MOUTH TWICE DAILY 60 tablet 0  . latanoprost (XALATAN) 0.005 % ophthalmic solution Place 1 drop into both eyes at bedtime.    Marland Kitchen levothyroxine (SYNTHROID, LEVOTHROID) 25 MCG tablet Take 0.5 tablets (12.5 mcg total) by mouth daily before breakfast. 30 tablet 1  . metoprolol succinate (TOPROL XL) 25 MG 24 hr tablet  Take 1 tablet (25 mg total) by mouth daily. 30 tablet 11  . nitroGLYCERIN (NITROSTAT) 0.4 MG SL tablet Place 1 tablet (0.4 mg total) under the tongue every 5 (five) minutes as needed. 25 tablet 3   No current facility-administered medications on file prior to visit.     BP 124/60   Pulse 79   Temp 98.2 F (36.8 C) (Oral)   Wt 142 lb (64.4 kg)   SpO2 97%   BMI 22.92 kg/m       Objective:   Physical Exam  Constitutional: He is oriented to person, place, and time. He appears well-developed and well-nourished. No distress.  Cardiovascular: Normal rate, regular rhythm, normal heart sounds and intact distal pulses. Exam reveals no gallop and no friction rub.  No murmur heard. Pulmonary/Chest: Effort normal. No stridor. No respiratory distress. He has wheezes. He has no rales. He exhibits no tenderness.  Abdominal: Soft. Bowel sounds are normal.  Musculoskeletal: He exhibits tenderness (lower lumbar and sacral spine with palpation ).  - Obvious pain with changing positions from sitting to standing.  - Using rolling walker with seat. Has slow steady gait.   Neurological: He is oriented to person, place, and time. No cranial nerve deficit.  Skin: Skin is warm and dry. Capillary refill takes less than 2 seconds. He is not diaphoretic.  Psychiatric: He has a normal mood and affect. His behavior is normal. Judgment and thought content normal.  Vitals reviewed.     Assessment & Plan:  1. Bronchitis - Nebulizer provided  - DG Chest 2 View; Future - acetaminophen-codeine (TYLENOL #3) 300-30 MG tablet; Take 1 tablet by mouth every 6 (six) hours as needed for up to 10 days (cough).  Dispense: 30 tablet; Refill: 0 - albuterol (PROVENTIL) (2.5 MG/3ML) 0.083% nebulizer solution; Take 3 mLs (2.5 mg total) by nebulization every 8 (eight) hours as needed for wheezing or shortness of breath.  Dispense: 50 mL; Refill: 0 - Follow up if no improvement  2. Chronic midline low back pain without  sciatica - Will get xray of lumbar spine. Consider further imaging in the future. Does not appear to be any spinal cord  issues. I think he would benefit from PT for gait training. He will wait until xray is back  - DG Lumbar Spine Complete; Future  Dorothyann Peng, NP

## 2017-10-14 ENCOUNTER — Encounter: Payer: Self-pay | Admitting: Cardiology

## 2017-10-14 ENCOUNTER — Ambulatory Visit: Payer: Medicare Other | Admitting: Adult Health

## 2017-10-14 ENCOUNTER — Ambulatory Visit (INDEPENDENT_AMBULATORY_CARE_PROVIDER_SITE_OTHER)
Admission: RE | Admit: 2017-10-14 | Discharge: 2017-10-14 | Disposition: A | Discharge status: Home / Self Care | Payer: Medicare Other | Source: Ambulatory Visit | Attending: Adult Health | Admitting: Adult Health

## 2017-10-14 ENCOUNTER — Telehealth: Payer: Self-pay | Admitting: Adult Health

## 2017-10-14 DIAGNOSIS — R2681 Unsteadiness on feet: Secondary | ICD-10-CM

## 2017-10-14 DIAGNOSIS — J40 Bronchitis, not specified as acute or chronic: Secondary | ICD-10-CM

## 2017-10-14 DIAGNOSIS — R05 Cough: Secondary | ICD-10-CM | POA: Diagnosis not present

## 2017-10-14 DIAGNOSIS — G8929 Other chronic pain: Secondary | ICD-10-CM

## 2017-10-14 DIAGNOSIS — M545 Low back pain: Secondary | ICD-10-CM

## 2017-10-14 MED ORDER — BENZONATATE 200 MG PO CAPS: 200.0000 mg | ORAL_CAPSULE | Freq: Two times a day (BID) | ORAL | 0 refills | Status: DC | PRN

## 2017-10-14 NOTE — Telephone Encounter (Signed)
Spoke with patient and informed him of his xrays. Chest xray negative for acute process. Lumbar spine x ray shows:   Grade 2 anterolisthesis of L5 with respect S1 secondary to degenerative disc disease and bilateral pars defects. Moderate to severe disc space narrowing at L1-2 and at L2-3 consistent with degenerative change.  We discussed options and he is not interested in surgery at this point in his life. I agree with this assessment. He is ok with trying PT.

## 2017-10-16 ENCOUNTER — Other Ambulatory Visit: Payer: Self-pay | Admitting: Adult Health

## 2017-10-16 MED ORDER — AZITHROMYCIN 250 MG PO TABS: ORAL_TABLET | ORAL | 0 refills | Status: DC

## 2017-10-19 ENCOUNTER — Telehealth: Payer: Self-pay

## 2017-10-19 ENCOUNTER — Telehealth: Payer: Self-pay | Admitting: Emergency Medicine

## 2017-10-19 DIAGNOSIS — C61 Malignant neoplasm of prostate: Secondary | ICD-10-CM | POA: Diagnosis not present

## 2017-10-19 NOTE — Telephone Encounter (Signed)
Dr. Loletha Carrow from GI came to speak to Dr. Lamonte Sakai about this pt this morning. Pt has been having increased shortness of breath for several days. Per Dr. Lamonte Sakai, pt needs to be seen acutely.  Spoke with pt. He has been scheduled with Wyn Quaker, NP on 10/20/17 at 10am. Nothing further was needed.

## 2017-10-19 NOTE — Telephone Encounter (Signed)
   [facility] Pre-operative Risk Assessment    Request for surgical clearance:  1. What type of surgery is being performed? Routine dental care   2. When is this surgery scheduled? Pending  3. What type of clearance is required (medical clearance vs. Pharmacy clearance to hold med vs. Both)? Both  4. Are there any medications that need to be held prior to surgery and how long? Not Listed, (patient takes Eliquis)   5. Practice name and name of physician performing surgery? [facility]   6. What is your office phone number? [phone]    7.   What is your office fax number? [phone]  8.   Anesthesia type (None, local, MAC, general) ? Not listed    Joaquim Lai 10/19/2017, 4:05 PM  _________________________________________________________________   (provider comments below)

## 2017-10-20 ENCOUNTER — Other Ambulatory Visit (INDEPENDENT_AMBULATORY_CARE_PROVIDER_SITE_OTHER): Payer: Medicare Other

## 2017-10-20 ENCOUNTER — Ambulatory Visit: Payer: Medicare Other | Admitting: Pulmonary Disease

## 2017-10-20 ENCOUNTER — Encounter: Payer: Self-pay | Admitting: Pulmonary Disease

## 2017-10-20 VITALS — BP 122/78 | HR 75 | Ht 65.0 in | Wt 143.6 lb

## 2017-10-20 DIAGNOSIS — R0609 Other forms of dyspnea: Secondary | ICD-10-CM

## 2017-10-20 DIAGNOSIS — R5383 Other fatigue: Secondary | ICD-10-CM | POA: Diagnosis not present

## 2017-10-20 DIAGNOSIS — I5032 Chronic diastolic (congestive) heart failure: Secondary | ICD-10-CM

## 2017-10-20 DIAGNOSIS — R0600 Dyspnea, unspecified: Secondary | ICD-10-CM

## 2017-10-20 LAB — BASIC METABOLIC PANEL
BUN: 20 mg/dL (ref 6–23)
CALCIUM: 8.3 mg/dL — AB (ref 8.4–10.5)
CHLORIDE: 104 meq/L (ref 96–112)
CO2: 24 meq/L (ref 19–32)
Creatinine, Ser: 1.13 mg/dL (ref 0.40–1.50)
GFR: 65.49 mL/min (ref >60.00)
Glucose, Bld: 102 mg/dL — ABNORMAL HIGH (ref 70–99)
Potassium: 3.7 mEq/L (ref 3.5–5.1)
SODIUM: 136 meq/L (ref 135–145)

## 2017-10-20 LAB — CBC
HCT: 33.6 % — ABNORMAL LOW (ref 39.0–52.0)
Hemoglobin: 10.9 g/dL — ABNORMAL LOW (ref 13.0–17.0)
MCHC: 32.5 g/dL (ref 30.0–36.0)
MCV: 80.2 fl (ref 78.0–100.0)
Platelets: 193 10*3/uL (ref 150.0–400.0)
RBC: 4.19 Mil/uL — ABNORMAL LOW (ref 4.22–5.81)
RDW: 18 % — AB (ref 11.5–15.5)
WBC: 7.4 10*3/uL (ref 4.0–10.5)

## 2017-10-20 LAB — BRAIN NATRIURETIC PEPTIDE: PRO B NATRI PEPTIDE: 146 pg/mL — AB (ref 0.0–100.0)

## 2017-10-20 MED ORDER — IPRATROPIUM-ALBUTEROL 0.5-2.5 (3) MG/3ML IN SOLN: 3.0000 mL | Freq: Four times a day (QID) | RESPIRATORY_TRACT | Status: DC

## 2017-10-20 MED ORDER — BUDESONIDE-FORMOTEROL FUMARATE 80-4.5 MCG/ACT IN AERO: 2.0000 | INHALATION_SPRAY | Freq: Two times a day (BID) | RESPIRATORY_TRACT | 0 refills | Status: DC

## 2017-10-20 MED ORDER — FUROSEMIDE 20 MG PO TABS: ORAL_TABLET | ORAL | 11 refills | Status: DC

## 2017-10-20 NOTE — Assessment & Plan Note (Signed)
Lab work today >>> BNP to check for fluid overload >>> Being met to check kidney functioning We will follow-up with you regarding these lab results if we need any additional medications or treatment  Follow-up in 2 weeks with Wyn Quaker FNP-C >>> Continue close follow-up with office, notify if you having any sort of changes in her breathing or respiratory symptoms.

## 2017-10-20 NOTE — Progress Notes (Signed)
_0  ID: Ryan [name], male    DOB: 09-20-1929, 84 y.o.   MRN: [id number]  Chief Complaint  Patient presents with  . Acute Visit    weak, fatigue, loss of balance. chronic cough for two weeks    Referring provider: Dorothyann Peng, NP  HPI: 84 year old never smoker with a significant cardiac history that includes hypertension, diastolic congestive heart failure, coronary artery disease, hyperlipidemia, atrial fibrillation, tachybradycardia syndrome with pacemaker in place (03/2016), severe aortic stenosis for which he underwent a TAVR January 2018.  Currently on amiodarone for 1 year and anticoagulation. Other past medical history: Untreated obstructive sleep apnea, bladder cancer, prostate cancer, asthma, chronic back pain.   08/03/2017 Office visit [facility] Reporting progressive fatigue has been present for over 2 years.  Able to do some exercise, but hears wheezing when he exercises difficult to move air through right side due to deviated septum.  He was started on albuterol which has helped somewhat the wheeze.  Pulmonary function test done on 07/07/2017-  High-res CT ordered to track lung nodules as well as to check for interstitial lung disease due to history of being on amiodarone.  03/2016 CT showed small pulmonary nodules.  No desats on walk test today.  VQ scan ordered. Potentially may need sleep study to check for obstructive sleep apnea and CPAP use.  Also may need to consider cardiopulmonary exercise if these other tests do not indicate any particular issues.  09/01/2017 Office visit-[facility] Reviewed high-res CT scan which shows very subtle basilar interstitial changes that are not necessarily consistent with UIP.  Some scattered bilateral stable small pulmonary nodules that are almost certainly benign.  VQ scan on 3/7 was low probability for PE.  He tells me the albuterol has helped with his intermittent wheezing.  Consider cardio pulmonary rehab.  Will follow CT results with chest  x-ray and may consider repeating CT depending on chest x-ray and his clinical status after 1 year (09/2018).  Tests:  Imaging:  10/14/2017 chest x-ray-no acute cardiopulmonary disease, lungs are well-expanded 08/06/2017-CT chest high-res- subtle fibrotic changes in extreme lung bases, multiple pulmonary nodules scattered throughout the lungs stable in size compared with 03/19/2016 CT 08/06/2017-VQ scan- normal ventilation improved perfusion lung scan, low risk for PE  Pulmonary function test 07/07/2017- spirometry is grossly normal, no significant bronchodilator response, lung volumes normal, decreased diffusion capacity.  Cardiac Tests 05/13/2017-echocardiogram- LV ejection fraction 40 to 95%, grade 1 diastolic dysfunction   Chart Review:  09/06/2017-emergency room visit-acute upper respiratory infection-chest x-ray inconclusive considering probably viral, still started on doxycycline and prednisone with albuterol nebulizers and rescue inhalers to be used 09/07/2017-seen by cardiology-advised to not take prednisone, appears clinically stable per assessment 09/11/2017-office visit- primary care- diagnosed with bronchitis, Pulmicort started, prednisone given, albuterol given 10/13/2017-primary care- bronchitis, lower back pain, x-ray of spine, Tylenol 3 given. 10/16/2017-  azithromycin given to patient by primary care   10/20/17  Acute  Patient presents office today with wife.  Patient has had extensive medical follow-up over the past couple of weeks going back and forth between urgent care, primary care, cardiology, and pulmonary.  Patient has finished doxycycline as well as a prednisone taper.  Now is about to finish his azithromycin/Z-Pak tomorrow.  Patient has had multiple chest x-ray showing no acute findings.  Patient also reporting that they have started using Tessalon Perles which is not helped to slow the cough.  Yesterday patient started doing fluticasone nasal nose spray 1 spray each nostril daily  as well  as taking a daily Zyrtec.  Patient and wife admit that this did improve the cough subtly yesterday.  Patient reporting white mucus occasionally with cough.    Patient is mainly reporting cough as well as fatigue.  Patient wants to "feel better and get back to doing the things that he used to do".  Patient denies any fevers.  Patient has been using nebulizer 1 time daily.   Allergies  Allergen Reactions  . Ciprofloxacin Anaphylaxis  . Hydrochlorothiazide Anaphylaxis  . Sulfa Antibiotics Anaphylaxis  . Ace Inhibitors Cough  . Losartan Cough  . Carvedilol Cough    Pt states it "causes him too cough."  . Lisinopril Cough    Pt reports worsening cough and "feeling wheezy."    Immunization History  Administered Date(s) Administered  . Influenza,inj,Quad PF,6+ Mos 03/26/2017  . Pneumococcal Conjugate-13 04/30/2017  . Tdap 04/01/2015    Past Medical History:  Diagnosis Date  . Age-related macular degeneration   . Allergic rhinitis   . Aortic valve stenosis   . Arthritis    "hands, lower back" (04/30/2016)  . Asthma   . Carotid artery obstruction   . Chronic lower back pain   . Coronary arteriosclerosis in native artery   . Diverticulitis of colon   . DJD (degenerative joint disease)   . Dyspnea    "constant but the degree changes"  . Dysthymia   . GERD (gastroesophageal reflux disease)   . History of elevated PSA 05/2009  . History of hiatal hernia   . HLD (hyperlipidemia)   . Hypertension   . Nocturnal hypoxemia   . OSA (obstructive sleep apnea)    intolerant to CPAP  . Paroxysmal supraventricular tachycardia ([place])   . Pneumonia    "when I was a kid"  . Presence of permanent cardiac pacemaker   . Primary malignant neoplasm of bladder ([place])   . Prostate cancer ([place])     Tobacco History: Social History   Tobacco Use  Smoking Status Never Smoker  Smokeless Tobacco Never Used   Counseling given: Not Answered   Outpatient Encounter Medications as of  10/20/2017  Medication Sig  . acetaminophen (TYLENOL) 500 MG tablet Take 500 mg by mouth as needed for mild pain.   Marland Kitchen acetaminophen-codeine (TYLENOL #3) 300-30 MG tablet Take 1 tablet by mouth every 6 (six) hours as needed for up to 10 days (cough).  Marland Kitchen albuterol (PROVENTIL) (2.5 MG/3ML) 0.083% nebulizer solution Take 3 mLs (2.5 mg total) by nebulization every 8 (eight) hours as needed for wheezing or shortness of breath.  Marland Kitchen amiodarone (PACERONE) 200 MG tablet Take 200 mg (one tablet) by mouth once daily Mon-Sat.  Do not take on Sunday.  Marland Kitchen amLODipine (NORVASC) 5 MG tablet Take 1 tablet (5 mg total) by mouth daily.  Marland Kitchen atorvastatin (LIPITOR) 20 MG tablet Take 1 tablet (20 mg total) by mouth daily.  Marland Kitchen azithromycin (ZITHROMAX Z-PAK) 250 MG tablet Take 2 tablets on Day 1.  Then take 1 tablet daily.  . benzonatate (TESSALON) 200 MG capsule Take 1 capsule (200 mg total) by mouth 2 (two) times daily as needed for cough.  . budesonide (PULMICORT) 0.5 MG/2ML nebulizer solution Take 2 mLs (0.5 mg total) by nebulization every 8 (eight) hours as needed.  . cholecalciferol (VITAMIN D) 1000 units tablet Take 1,000 Units by mouth 2 (two) times a week.   . Coenzyme Q10 (COQ-10 PO) Take 1 capsule by mouth 2 (two) times a week.   . Cyanocobalamin (VITAMIN B 12  PO) Take 1 capsule by mouth daily.  Marland Kitchen ELIQUIS 2.5 MG TABS tablet TAKE 1 TABLET BY MOUTH TWICE DAILY  . ELIQUIS 2.5 MG TABS tablet TAKE 1 TABLET BY MOUTH TWICE DAILY  . latanoprost (XALATAN) 0.005 % ophthalmic solution Place 1 drop into both eyes at bedtime.  Marland Kitchen levothyroxine (SYNTHROID, LEVOTHROID) 25 MCG tablet Take 0.5 tablets (12.5 mcg total) by mouth daily before breakfast.  . metoprolol succinate (TOPROL XL) 25 MG 24 hr tablet Take 1 tablet (25 mg total) by mouth daily.  . nitroGLYCERIN (NITROSTAT) 0.4 MG SL tablet Place 1 tablet (0.4 mg total) under the tongue every 5 (five) minutes as needed.  . budesonide-formoterol (SYMBICORT) 80-4.5 MCG/ACT inhaler  Inhale 2 puffs into the lungs 2 (two) times daily.   Facility-Administered Encounter Medications as of 10/20/2017  Medication  . ipratropium-albuterol (DUONEB) 0.5-2.5 (3) MG/3ML nebulizer solution 3 mL     Review of Systems  Constitutional:  +fatigue/lassitude No  weight loss, night sweats,  Fevers, chills  HEENT:   +post nasal drip No headaches,  Difficulty swallowing,  Tooth/dental problems, or  Sore throat, No sneezing, itching, ear ache,  CV:  +increased LE swelling bilaterally, occasional dizziness No chest pain,  Orthopnea, PND,  dizziness, palpitations, syncope.   GI: No heartburn, indigestion, abdominal pain, nausea, vomiting, diarrhea, change in bowel habits, loss of appetite, bloody stools.   Resp: +shortness of breath with rest and exertion, dry and productive cough, with white thick mucous   No coughing up of blood.  No change in color of mucus.  No wheezing.  No chest wall deformity  Skin: no rash or lesions.  GU: no dysuria, change in color of urine, no urgency or frequency.  No flank pain, no hematuria   MS:  No joint pain or swelling.  No decreased range of motion.  No back pain.    Physical Exam  BP 122/78 (BP Location: Right Arm, Cuff Size: Normal)   Pulse 75   Ht _0  (1.651 m)   Wt 143 lb 9.6 oz (65.1 kg)   SpO2 93%   BMI 23.90 kg/m   GEN: A/Ox3; pleasant, NAD, chronically ill, fatigued   HEENT:  [place]/AT,  EACs-clear, TMs-wnl, NOSE- erythematous , THROAT- +post nasal drip  no lesions  NECK:  Supple w/ fair ROM; no JVD; no thyromegaly or nodules palpated; no lymphadenopathy.    RESP  +inspiratory and expiratory wheezes in upper lobes bilaterally with diminished in the bases. Deep breaths illcits cough. No accessory muscle use, no dullness to percussion  CARD:  2+ edema in LE bilaterally, RRR, no m/r/g, pulses intact, no cyanosis or clubbing.  GI: Soft & nt; nml bowel sounds; no organomegaly or masses detected.   Musco: Warm bil, no deformities or  joint swelling noted.   Neuro: alert, no focal deficits noted.    Skin: Warm, no lesions or rashes   Lab Results:  CBC    Component Value Date/Time   WBC 4.9 05/01/2017 0827   RBC 5.14 05/01/2017 0827   HGB 13.3 09/06/2017 1334   HGB 12.6 (L) 08/21/2016 0831   HCT 39.0 09/06/2017 1334   HCT 39.1 08/21/2016 0831   PLT 167.0 05/01/2017 0827   PLT 226 08/21/2016 0831   MCV 79.4 05/01/2017 0827   MCV 85 08/21/2016 0831   MCH 24.9 (L) 12/05/2016 1036   MCHC 31.6 05/01/2017 0827   RDW 18.9 (H) 05/01/2017 0827   RDW 15.1 08/21/2016 0831   LYMPHSABS 0.8 05/01/2017 0827  LYMPHSABS 1.0 08/21/2016 0831   MONOABS 0.7 05/01/2017 0827   EOSABS 0.1 05/01/2017 0827   EOSABS 0.3 08/21/2016 0831   BASOSABS 0.0 05/01/2017 0827   BASOSABS 0.1 08/21/2016 0831    BMET    Component Value Date/Time   NA 135 09/06/2017 1334   K 4.4 09/06/2017 1334   CL 98 (L) 09/06/2017 1334   CO2 27 05/01/2017 0827   GLUCOSE 110 (H) 09/06/2017 1334   BUN 19 09/06/2017 1334   CREATININE 1.40 (H) 09/06/2017 1334   CREATININE 1.18 (H) 05/22/2016 1225   CALCIUM 9.4 05/01/2017 0827   GFRNONAA 59 (L) 12/05/2016 1036   GFRNONAA 62 03/14/2016 0945   GFRAA >60 12/05/2016 1036   GFRAA 72 03/14/2016 0945    BNP    Component Value Date/Time   BNP 38.4 03/18/2016 1958    ProBNP No results found for: PROBNP  Imaging: Dg Chest 2 View  Result Date: 10/14/2017 CLINICAL DATA:  Cough and wheezing due to bronchitis. History of valve replacement and pacemaker placement. Nonsmoker. EXAM: CHEST - 2 VIEW COMPARISON:  PA and lateral chest x-ray of September 06, 2017 FINDINGS: The lungs are well-expanded. There is no focal infiltrate. There is no pleural effusion. The heart and pulmonary vascularity are normal. And prosthetic aortic valve cage is present. The ICD is in stable position. There is calcification in the wall of the aortic arch. The bony thorax exhibits no acute abnormality. IMPRESSION: There is no active  cardiopulmonary disease. Electronically Signed   By: David  Martinique M.D.   On: 10/14/2017 15:01   Dg Lumbar Spine Complete  Result Date: 10/14/2017 CLINICAL DATA:  Worsening low back pain for the past 2 weeks with no known injury. EXAM: LUMBAR SPINE - COMPLETE 4+ VIEW COMPARISON:  Abdominal CT scan of March 19, 2016 FINDINGS: The colonic stool burden is increased diffusely. There is mild levocurvature centered at L4-5. There is grade 2 anterolisthesis of L5 with respect to S1 measuring approximately 1.7 cm. There is degenerative disc space narrowing at L1-2 and L2-3 as well as at L5-S1. There are bilateral pars defects at L5. The pedicles appear intact. The observed portions of the sacrum are normal. IMPRESSION: Grade 2 anterolisthesis of L5 with respect S1 secondary to degenerative disc disease and bilateral pars defects. Moderate to severe disc space narrowing at L1-2 and at L2-3 consistent with degenerative change. Electronically Signed   By: David  Martinique M.D.   On: 10/14/2017 14:59     Assessment & Plan:   Potential CHF exacerbation versus recurrent bronchitis versus potential asthma flare  Significant inspiratory and expiratory wheezes.  Patient does not want to take another prednisone taper.  Will do trial of Symbicort 80 2 puffs twice a day.  As well as duo nebs.  And continue rescue inhaler.  Will do BNP and BMET at work-up to check for fluid overload.  Patient will also wear compression stockings and keep legs elevated.  2-week follow-up.  Patient will call office if breathing and symptoms are not improving and worsening to be seen sooner.  DOE (dyspnea on exertion) Start Symbicort today 2 puffs every 12 hours Start duo nebs nebulizer solution can use every 6 hours Continue albuterol rescue inhaler as needed for shortness of breath  Lab work today >>> BNP to check for fluid overload >>> Being met to check kidney functioning We will follow-up with you regarding these lab results if we  need any additional medications or treatment  Update cardiology with current plan  as well as with the lower extremity swelling  Follow-up in 2 weeks with Wyn Quaker FNP-C >>> Continue close follow-up with office, notify if you having any sort of changes in her breathing or respiratory symptoms.  Fatigue Lab work today >>> BNP to check for fluid overload >>> Being met to check kidney functioning We will follow-up with you regarding these lab results if we need any additional medications or treatment  Follow-up in 2 weeks with Wyn Quaker FNP-C >>> Continue close follow-up with office, notify if you having any sort of changes in her breathing or respiratory symptoms.  Chronic diastolic CHF (congestive heart failure) ([place]) Lab work today >>> BNP to check for fluid overload >>> Being met to check kidney functioning We will follow-up with you regarding these lab results if we need any additional medications or treatment Update cardiology with current plan as well as with the lower extremity swelling  Follow-up in 2 weeks with Wyn Quaker FNP-C >>> Continue close follow-up with office, notify if you having any sort of changes in her breathing or respiratory symptoms.     Lauraine Rinne, NP 10/20/2017

## 2017-10-20 NOTE — Assessment & Plan Note (Addendum)
Lab work today >>> BNP to check for fluid overload >>> Being met to check kidney functioning We will follow-up with you regarding these lab results if we need any additional medications or treatment Update cardiology with current plan as well as with the lower extremity swelling  Wear compression stockings for up to 16 hours a day Elevate legs   Follow-up in 2 weeks with Ryan Quaker FNP-C >>> Continue close follow-up with office, notify if you having any sort of changes in her breathing or respiratory symptoms.

## 2017-10-20 NOTE — Assessment & Plan Note (Signed)
Start Symbicort today 2 puffs every 12 hours Start duo nebs nebulizer solution can use every 6 hours Continue albuterol rescue inhaler as needed for shortness of breath  Lab work today >>> BNP to check for fluid overload >>> Being met to check kidney functioning We will follow-up with you regarding these lab results if we need any additional medications or treatment  Update cardiology with current plan as well as with the lower extremity swelling  Follow-up in 2 weeks with Ryan [name] >>> Continue close follow-up with office, notify if you having any sort of changes in her breathing or respiratory symptoms.

## 2017-10-20 NOTE — Addendum Note (Signed)
Addended by: Lauraine Rinne on: 10/20/2017 01:38 PM   Modules accepted: Orders

## 2017-10-20 NOTE — Patient Instructions (Addendum)
Start Symbicort today 2 puffs every 12 hours Start duo nebs nebulizer solution can use every 6 hours Continue albuterol rescue inhaler as needed for shortness of breath  Lab work today >>> BNP to check for fluid overload >>> Being met to check kidney functioning >>>CBC checking for anemia  We will follow-up with you regarding these lab results if we need any additional medications or treatment  Update cardiology with current plan as well as with the lower extremity swelling  Follow-up in 2 weeks with Wyn Quaker FNP-C >>> Continue close follow-up with office, notify if you having any sort of changes in her breathing or respiratory symptoms.     Please contact the office if your symptoms worsen or you have concerns that you are not improving.   Thank you for choosing [facility] for your healthcare, and for allowing [place] to partner with you on your healthcare journey. I am thankful to be able to provide care to you today.   Wyn Quaker FNP-C

## 2017-10-21 ENCOUNTER — Ambulatory Visit: Payer: Medicare Other | Admitting: Physical Therapy

## 2017-10-21 ENCOUNTER — Telehealth: Payer: Self-pay | Admitting: Cardiology

## 2017-10-21 ENCOUNTER — Telehealth: Payer: Self-pay | Admitting: *Deleted

## 2017-10-21 DIAGNOSIS — R0600 Dyspnea, unspecified: Secondary | ICD-10-CM

## 2017-10-21 DIAGNOSIS — R7989 Other specified abnormal findings of blood chemistry: Secondary | ICD-10-CM

## 2017-10-21 DIAGNOSIS — R0609 Other forms of dyspnea: Principal | ICD-10-CM

## 2017-10-21 DIAGNOSIS — R5383 Other fatigue: Secondary | ICD-10-CM

## 2017-10-21 DIAGNOSIS — I5032 Chronic diastolic (congestive) heart failure: Secondary | ICD-10-CM

## 2017-10-21 MED ORDER — LEVOTHYROXINE SODIUM 25 MCG PO TABS: 12.5000 ug | ORAL_TABLET | Freq: Every day | ORAL | 3 refills | Status: DC

## 2017-10-21 NOTE — Telephone Encounter (Signed)
   Primary Cardiologist:Katarina Meda Coffee, MD  Chart reviewed as part of pre-operative protocol coverage. Please get information regarding what type of dental care patient is having and needed to hold Eliquis? Also need Anesthesia type (None, local, MAC, general).

## 2017-10-21 NOTE — Telephone Encounter (Signed)
Dr. Meda Coffee, ok to refill this?

## 2017-10-21 NOTE — Telephone Encounter (Signed)
RE: Almond Fitzgibbon, Dec 10, 2029  Received: Today  Message Contents  Dorothy Spark, MD  Loletha Carrow Kirke Corin, MD; Dorothyann Peng, NP Cc: Nuala Alpha, LPN        Hi Dr Loletha Carrow,  Thank you so much for letting me know, his weight is more of les stable and his BNP was normal at 124. I agree with a trial of Lasix, we will bring him in for follow up within the next few days.  Considering that he is on amiodarone, he had recent pulmonary function test that showed pulmonary vascular process - that is suggestive of a fluid overload and subtle obstructive process.  His chest CT that showed very subtle fibrotic changes in the extreme lung bases which are highly nonspecific. 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 Dr Lamonte Sakai stated: This could be as simple as some basilar atelectasis but raises the question of scar versus interstitial lung disease. The changes are minor and I do not believe there is any indication to stop his amiodarone due to this observation.   However if he doesn't gat better with diuresis I would send him back to Dr Lovena Le for consideration of a different regimen such as tikosyn.   [place] Siren   Previous Messages    ----- Message -----  From: Doran Stabler, MD  Sent: 10/21/2017  7:20 AM  To: Dorothy Spark, MD, Dorothyann Peng, NP  Subject: [name], Dec 10, 2029             Etheleen Mayhew,   I wanted to update you both on Bob's condition.   Tommi Rumps is aware of a worsening cough over last 7-10 days. Tried another round of Abx that seemed to work a little at first, but still feels bad and now not sleeping well as a result of persistent dry cough.  Tessalon, tylenol codeine, albuterol and budesonide nebs, flonase and zytec on board. CXR again clear last week.  He has also had bilateral and symmetrical peripheral edema for 4-5 days, which is new for him.  Thought it was from inactivity, so got him compression stockings.  [facility] was good enough to get him seen by their NP Aaron Edelman yesterday. He wondered about pulmonary edema, sent Bob for BNP.  They called late yesterday and prescribed furosemide 40 mg daily x 3 days, so I guess the BNP was elevated? He only took it early this AM so clearly too soon to tell. I am having him continue nebulizer treatments since he was apparently wheezing on Brian's exam yesterday.   [place] Siren, your input would be helpful regarding the BNP. [name] has not had pulmonary edema before, though certainly things change.   This all seemed to begin with a resp tract infection 5-6 weeks ago, then got better, then cough returned.   If plan is to stay the course with current treatment, then that is all good. It would just help [name] if he knew everyone was informed and on board. We are trying to help him keep spirits up since he becomes despondent with sickness.   Thanks a million.   - Wilfrid Lund

## 2017-10-21 NOTE — Telephone Encounter (Signed)
Pt's medication was sent to pharmacy as requested. Confirmation received.

## 2017-10-21 NOTE — Telephone Encounter (Signed)
Do not need to hold Eliquis for routine dental care.

## 2017-10-21 NOTE — Telephone Encounter (Signed)
Got the pt scheduled to see Dr. Lovena Le in EP for tomorrow 10/22/17 at 1115.  Pt made aware of appt date and time by EP scheduler.

## 2017-10-21 NOTE — Telephone Encounter (Signed)
Message sent to [place], to call the pt back and arrange a follow-up appt with Dr Lovena Le, at his very next available slot.

## 2017-10-21 NOTE — Telephone Encounter (Signed)
New message:       Pt c/o medication issue:  1. Name of Medication: furosemide (LASIX) 20 MG tablet  2. How are you currently taking this medication (dosage and times per day)?  Please take 40 mg (2 tablets) for the next 3 days. Then, take 20 mg (1 tablet) as needed for lower extremity swelling. 3. Are you having a reaction (difficulty breathing--STAT)? No  4. What is your medication issue? Pt started this medication today for the next three days and then 1 as needed after that.

## 2017-10-21 NOTE — Telephone Encounter (Signed)
Pt requesting a refill on levothyroxine. Would Dr. Meda Coffee like to refill this medication? Please address

## 2017-10-21 NOTE — Telephone Encounter (Signed)
New Message:        *STAT* If patient is at the pharmacy, call can be transferred to refill team.   1. Which medications need to be refilled? (please list name of each medication and dose if known) levothyroxine (SYNTHROID, LEVOTHROID) 25 MCG tablet  2. Which pharmacy/location (including street and city if local pharmacy) is medication to be sent to?[facility] [place], [address] AT [address]  3. Do they need a 30 day or 90 day supply? [place]

## 2017-10-21 NOTE — Telephone Encounter (Signed)
yes

## 2017-10-21 NOTE — Telephone Encounter (Signed)
Pt was just calling to inform Dr Meda Coffee that Dr. Agustina Caroli Nurse Practitioner (Pulmonlogy) office started him on lasix yesterday.  Pt states that Dr Meda Coffee can view the OV note he had with Pulmonology yesterday, for they're in Epic. Pt just wanted to let her know.  Informed the pt that I will endorse this update to Dr Meda Coffee.

## 2017-10-21 NOTE — Telephone Encounter (Signed)
Called dentist office patient is only scheduled for routine cleanings, xrays, and exam. No anesthesia needed.

## 2017-10-22 ENCOUNTER — Encounter: Payer: Self-pay | Admitting: Internal Medicine

## 2017-10-22 ENCOUNTER — Ambulatory Visit: Payer: Medicare Other | Admitting: Internal Medicine

## 2017-10-22 VITALS — BP 108/60 | HR 75 | Ht 65.0 in | Wt 143.6 lb

## 2017-10-22 DIAGNOSIS — I10 Essential (primary) hypertension: Secondary | ICD-10-CM | POA: Diagnosis not present

## 2017-10-22 DIAGNOSIS — I480 Paroxysmal atrial fibrillation: Secondary | ICD-10-CM | POA: Diagnosis not present

## 2017-10-22 DIAGNOSIS — I495 Sick sinus syndrome: Secondary | ICD-10-CM | POA: Diagnosis not present

## 2017-10-22 DIAGNOSIS — I4891 Unspecified atrial fibrillation: Secondary | ICD-10-CM | POA: Diagnosis not present

## 2017-10-22 DIAGNOSIS — Z950 Presence of cardiac pacemaker: Secondary | ICD-10-CM | POA: Diagnosis not present

## 2017-10-22 MED ORDER — APIXABAN 2.5 MG PO TABS: 2.5000 mg | ORAL_TABLET | Freq: Two times a day (BID) | ORAL | 3 refills | Status: DC

## 2017-10-22 MED ORDER — FUROSEMIDE 20 MG PO TABS: 20.0000 mg | ORAL_TABLET | Freq: Every day | ORAL | 3 refills | Status: DC | PRN

## 2017-10-22 NOTE — Telephone Encounter (Signed)
   Primary Cardiologist: Ena Dawley, MD  Chart reviewed as part of pre-operative protocol coverage. Given past medical history and time since last visit, based on ACC/AHA guidelines, Ryan [name] would be at acceptable risk for the planned procedure without further cardiovascular testing.   I will route this recommendation to the requesting party via Epic fax function and remove from pre-op pool.  Please call with questions. Per our clinical pharmacist, no need for hold eliquis for routine dental care  Almyra Deforest, [place] 10/22/2017, 2:54 PM

## 2017-10-22 NOTE — Telephone Encounter (Signed)
Spoke with the pt and wife  and informed them both that per Dr Meda Coffee, he should now decrease his lasix dose to 20 mg po daily as needed for lower extremity edema. Both parties verbalized understanding and agrees with this plan.

## 2017-10-22 NOTE — Telephone Encounter (Addendum)
-----   Message from Dorothy Spark, MD sent at 10/22/2017  4:55 PM EDT ----- Regarding: RE: appt with Dr Lovena Le please tell him to stop lasix and use it just as needed for LE edema  ----- Message ----- From: Nuala Alpha, LPN Sent: 11/14/3792   4:06 PM To: Dorothy Spark, MD Subject: FW: appt with Dr Lovena Le                        Dr. Lovena Le only discontinued his Amio.  Pt still on lasix. Just wanted to let you know!  Thanks,  Karlene Einstein   ----- Message ----- From: Dorothy Spark, MD Sent: 10/22/2017  12:57 PM To: Nuala Alpha, LPN, Doran Stabler, MD Subject: RE: appt with Dr Paticia Stack thinks its amiodarone toxicity, no fluid overload, he is stopping amiodarone and lasix, I will keep you updated. [place] Siren   ----- Message ----- From: Doran Stabler, MD Sent: 10/22/2017   8:02 AM To: Dorothy Spark, MD, Nuala Alpha, LPN Subject: RE: appt with Dr Lovena Le                        Can't thank you enough for your attention to [name].  - HD ----- Message ----- From: Nuala Alpha, LPN Sent: 08/26/6145   4:42 PM To: Dorothy Spark, MD, Doran Stabler, MD Subject: appt with Dr Allene Pyo him scheduled to see Dr Lovena Le tomorrow at [phone].   Thanks,  [facility]

## 2017-10-22 NOTE — Progress Notes (Addendum)
HPI Ryan [name] returns today for followup of his atrial fib. He has maintained NSR for over 2 years on amiodarone. Over the past few months he has had sob and cough. He has had PFTs with a corrected DLCO of 50% predicted. His cough is dry and non-productive and has been incessant over the past few months. He denies fever or chills. No palpitations. He is weak.  Allergies  Allergen Reactions  . Ciprofloxacin Anaphylaxis  . Hydrochlorothiazide Anaphylaxis  . Sulfa Antibiotics Anaphylaxis  . Ace Inhibitors Cough  . Losartan Cough  . Carvedilol Cough    Pt states it "causes him too cough."  . Lisinopril Cough    Pt reports worsening cough and "feeling wheezy."     Current Outpatient Medications  Medication Sig Dispense Refill  . acetaminophen (TYLENOL) 500 MG tablet Take 500 mg by mouth as needed for mild pain.     Marland Kitchen acetaminophen-codeine (TYLENOL #3) 300-30 MG tablet Take 1 tablet by mouth every 6 (six) hours as needed for up to 10 days (cough). 30 tablet 0  . albuterol (PROVENTIL) (2.5 MG/3ML) 0.083% nebulizer solution Take 3 mLs (2.5 mg total) by nebulization every 8 (eight) hours as needed for wheezing or shortness of breath. 50 mL 0  . amLODipine (NORVASC) 5 MG tablet Take 1 tablet (5 mg total) by mouth daily. 90 tablet 2  . apixaban (ELIQUIS) 2.5 MG TABS tablet Take 1 tablet (2.5 mg total) by mouth 2 (two) times daily. 180 tablet 3  . atorvastatin (LIPITOR) 20 MG tablet Take 1 tablet (20 mg total) by mouth daily. 90 tablet 3  . azithromycin (ZITHROMAX Z-PAK) 250 MG tablet Take 2 tablets on Day 1.  Then take 1 tablet daily. 6 tablet 0  . benzonatate (TESSALON) 200 MG capsule Take 1 capsule (200 mg total) by mouth 2 (two) times daily as needed for cough. 20 capsule 0  . budesonide (PULMICORT) 0.5 MG/2ML nebulizer solution Take 2 mLs (0.5 mg total) by nebulization every 8 (eight) hours as needed. 50 mL 0  . budesonide-formoterol (SYMBICORT) 80-4.5 MCG/ACT inhaler Inhale 2 puffs  into the lungs 2 (two) times daily. 1 Inhaler 0  . cholecalciferol (VITAMIN D) 1000 units tablet Take 1,000 Units by mouth 2 (two) times a week.     . Coenzyme Q10 (COQ-10 PO) Take 1 capsule by mouth 2 (two) times a week.     . Cyanocobalamin (VITAMIN B 12 PO) Take 1 capsule by mouth daily.    . furosemide (LASIX) 20 MG tablet Please take 40 mg (2 tablets) for the next 3 days.  Then, take 20 mg (1 tablet) as needed for lower extremity swelling. 60 tablet 11  . latanoprost (XALATAN) 0.005 % ophthalmic solution Place 1 drop into both eyes at bedtime.    Marland Kitchen levothyroxine (SYNTHROID, LEVOTHROID) 25 MCG tablet Take 0.5 tablets (12.5 mcg total) by mouth daily before breakfast. 45 tablet 3  . metoprolol succinate (TOPROL XL) 25 MG 24 hr tablet Take 1 tablet (25 mg total) by mouth daily. 30 tablet 11  . nitroGLYCERIN (NITROSTAT) 0.4 MG SL tablet Place 1 tablet (0.4 mg total) under the tongue every 5 (five) minutes as needed. 25 tablet 3   Current Facility-Administered Medications  Medication Dose Route Frequency Provider Last Rate Last Dose  . ipratropium-albuterol (DUONEB) 0.5-2.5 (3) MG/3ML nebulizer solution 3 mL  3 mL Nebulization Q6H Lauraine Rinne, NP         Past Medical History:  Diagnosis Date  . Age-related macular degeneration   . Allergic rhinitis   . Aortic valve stenosis   . Arthritis    "hands, lower back" (04/30/2016)  . Asthma   . Carotid artery obstruction   . Chronic lower back pain   . Coronary arteriosclerosis in native artery   . Diverticulitis of colon   . DJD (degenerative joint disease)   . Dyspnea    "constant but the degree changes"  . Dysthymia   . GERD (gastroesophageal reflux disease)   . History of elevated PSA 05/2009  . History of hiatal hernia   . HLD (hyperlipidemia)   . Hypertension   . Nocturnal hypoxemia   . OSA (obstructive sleep apnea)    intolerant to CPAP  . Paroxysmal supraventricular tachycardia ([place])   . Pneumonia    "when I was a kid"  .  Presence of permanent cardiac pacemaker   . Primary malignant neoplasm of bladder ([place])   . Prostate cancer ([place])     ROS:   All systems reviewed and negative except as noted in the HPI.   Past Surgical History:  Procedure Laterality Date  . APPENDECTOMY    . CARDIAC CATHETERIZATION N/A 01/17/2016   Procedure: Right/Left Heart Cath and Coronary Angiography;  Surgeon: Belva Crome, MD;  Location: [place] [facility];  Service: Cardiovascular;  Laterality: N/A;  . CARDIAC CATHETERIZATION N/A 04/30/2016   Procedure: Coronary/Graft Atherectomy;  Surgeon: Sherren Mocha, MD;  Location: [place] [facility];  Service: Cardiovascular;  Laterality: N/A;  . CAROTID ENDARTERECTOMY Bilateral   . CATARACT EXTRACTION W/ INTRAOCULAR LENS  IMPLANT, BILATERAL Bilateral   . COLON SURGERY  1981   Meckles Diverticulum with volvulus  . EP IMPLANTABLE DEVICE N/A 03/21/2016   Procedure: Pacemaker Implant;  Surgeon: Evans Lance, MD;  Location: [place] [facility];  Service: Cardiovascular;  Laterality: N/A;  . INGUINAL HERNIA REPAIR Right 2009  . INSERTION PROSTATE RADIATION SEED    . TEE WITHOUT CARDIOVERSION N/A 06/10/2016   Procedure: TRANSESOPHAGEAL ECHOCARDIOGRAM (TEE);  Surgeon: Sherren Mocha, MD;  Location: [place];  Service: Open Heart Surgery;  Laterality: N/A;  . TONSILLECTOMY  ~ 1947  . TRANSCATHETER AORTIC VALVE REPLACEMENT, TRANSFEMORAL N/A 06/10/2016   Procedure: TRANSCATHETER AORTIC VALVE REPLACEMENT, TRANSFEMORAL;  Surgeon: Sherren Mocha, MD;  Location: [place];  Service: Open Heart Surgery;  Laterality: N/A;     Family History  Problem Relation Age of Onset  . Heart disease Mother   . Heart disease Father      Social History   Socioeconomic History  . Marital status: Married    Spouse name: Not on file  . Number of children: Not on file  . Years of education: Not on file  . Highest education level: Not on file  Occupational History  . Occupation: retired  Scientific laboratory technician  .  Financial resource strain: Not on file  . Food insecurity:    Worry: Not on file    Inability: Not on file  . Transportation needs:    Medical: Not on file    Non-medical: Not on file  Tobacco Use  . Smoking status: Never Smoker  . Smokeless tobacco: Never Used  Substance and Sexual Activity  . Alcohol use: No    Alcohol/week: 0.0 oz    Comment: 04/30/2016 "nothing in years"  . Drug use: No  . Sexual activity: Never  Lifestyle  . Physical activity:    Days per week: Not on file  Minutes per session: Not on file  . Stress: Not on file  Relationships  . Social connections:    Talks on phone: Not on file    Gets together: Not on file    Attends religious service: Not on file    Active member of club or organization: Not on file    Attends meetings of clubs or organizations: Not on file    Relationship status: Not on file  . Intimate partner violence:    Fear of current or ex partner: Not on file    Emotionally abused: Not on file    Physically abused: Not on file    Forced sexual activity: Not on file  Other Topics Concern  . Not on file  Social History Narrative   Retired    Three children - One in [place], One in [place], and one in [place].         BP 108/60   Pulse 75   Ht 5\' 5"  (1.651 m)   Wt 143 lb 9.6 oz (65.1 kg)   SpO2 94%   BMI 23.90 kg/m   Physical Exam:  Well appearing 84 yo man, NAD HEENT: Unremarkable Neck:  6 cm JVD, no thyromegally Lymphatics:  No adenopathy Back:  No CVA tenderness Lungs:  Clear with no wheezes HEART:  Regular rate rhythm, no murmurs, no rubs, no clicks Abd:  soft, positive bowel sounds, no organomegally, no rebound, no guarding Ext:  2 plus pulses, no edema, no cyanosis, no clubbing Skin:  No rashes no nodules Neuro:  CN II through XII intact, motor grossly intact  EKG - NSR with atrial pacing and LBBB  DEVICE  Normal device function.  See PaceArt for details.   Assess/Plan: 1. PAF - he is maintaining NSR. I have  asked the patient to stop his amio. I will see him back in 3 months. 2. 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 I have asked him to stop the amio. 3. PPM - his medtronic DDD PM is working normally. Will recheck in several months. 4. Dyspnea - while this is multifactorial, I think he will improve with resolution of the lung toxicity.   Ryan [name].D.

## 2017-10-22 NOTE — Patient Instructions (Addendum)
Medication Instructions:  Your physician has recommended you make the following change in your medication:  1.  Stop taking amiodarone  Labwork: None ordered.  Testing/Procedures: None ordered.  Follow-Up: Your physician wants you to follow-up in: 3 months with Dr. Lovena Le.      Remote monitoring is used to monitor your Pacemaker from home. This monitoring reduces the number of office visits required to check your device to one time per year. It allows [place] to keep an eye on the functioning of your device to ensure it is working properly. You are scheduled for a device check from home on 01/11/2018. You may send your transmission at any time that day. If you have a wireless device, the transmission will be sent automatically. After your physician reviews your transmission, you will receive a postcard with your next transmission date.  Any Other Special Instructions Will Be Listed Below (If Applicable).  If you need a refill on your cardiac medications before your next appointment, please call your pharmacy.

## 2017-10-23 ENCOUNTER — Emergency Department (HOSPITAL_COMMUNITY): Payer: Medicare Other

## 2017-10-23 ENCOUNTER — Other Ambulatory Visit: Payer: Self-pay

## 2017-10-23 ENCOUNTER — Encounter (HOSPITAL_COMMUNITY): Payer: Self-pay | Admitting: Nurse Practitioner

## 2017-10-23 ENCOUNTER — Observation Stay (HOSPITAL_COMMUNITY)
Admission: EM | Admit: 2017-10-23 | Discharge: 2017-10-24 | Disposition: A | Discharge status: Home / Self Care | Payer: Medicare Other | Attending: Internal Medicine | Admitting: Internal Medicine

## 2017-10-23 DIAGNOSIS — J40 Bronchitis, not specified as acute or chronic: Secondary | ICD-10-CM

## 2017-10-23 DIAGNOSIS — I739 Peripheral vascular disease, unspecified: Secondary | ICD-10-CM | POA: Insufficient documentation

## 2017-10-23 DIAGNOSIS — J90 Pleural effusion, not elsewhere classified: Secondary | ICD-10-CM | POA: Diagnosis not present

## 2017-10-23 DIAGNOSIS — G4733 Obstructive sleep apnea (adult) (pediatric): Secondary | ICD-10-CM | POA: Insufficient documentation

## 2017-10-23 DIAGNOSIS — I482 Chronic atrial fibrillation: Secondary | ICD-10-CM | POA: Insufficient documentation

## 2017-10-23 DIAGNOSIS — Z882 Allergy status to sulfonamides status: Secondary | ICD-10-CM | POA: Insufficient documentation

## 2017-10-23 DIAGNOSIS — R0602 Shortness of breath: Secondary | ICD-10-CM | POA: Diagnosis not present

## 2017-10-23 DIAGNOSIS — M19042 Primary osteoarthritis, left hand: Secondary | ICD-10-CM | POA: Insufficient documentation

## 2017-10-23 DIAGNOSIS — R55 Syncope and collapse: Secondary | ICD-10-CM

## 2017-10-23 DIAGNOSIS — Z7901 Long term (current) use of anticoagulants: Secondary | ICD-10-CM | POA: Diagnosis not present

## 2017-10-23 DIAGNOSIS — I252 Old myocardial infarction: Secondary | ICD-10-CM | POA: Insufficient documentation

## 2017-10-23 DIAGNOSIS — R942 Abnormal results of pulmonary function studies: Secondary | ICD-10-CM | POA: Diagnosis present

## 2017-10-23 DIAGNOSIS — J9621 Acute and chronic respiratory failure with hypoxia: Secondary | ICD-10-CM | POA: Insufficient documentation

## 2017-10-23 DIAGNOSIS — J45909 Unspecified asthma, uncomplicated: Secondary | ICD-10-CM | POA: Insufficient documentation

## 2017-10-23 DIAGNOSIS — E785 Hyperlipidemia, unspecified: Secondary | ICD-10-CM | POA: Insufficient documentation

## 2017-10-23 DIAGNOSIS — H3530 Unspecified macular degeneration: Secondary | ICD-10-CM | POA: Insufficient documentation

## 2017-10-23 DIAGNOSIS — I350 Nonrheumatic aortic (valve) stenosis: Secondary | ICD-10-CM | POA: Insufficient documentation

## 2017-10-23 DIAGNOSIS — Z79890 Hormone replacement therapy (postmenopausal): Secondary | ICD-10-CM | POA: Insufficient documentation

## 2017-10-23 DIAGNOSIS — R7989 Other specified abnormal findings of blood chemistry: Secondary | ICD-10-CM | POA: Diagnosis present

## 2017-10-23 DIAGNOSIS — J984 Other disorders of lung: Secondary | ICD-10-CM

## 2017-10-23 DIAGNOSIS — D539 Nutritional anemia, unspecified: Secondary | ICD-10-CM | POA: Insufficient documentation

## 2017-10-23 DIAGNOSIS — R0902 Hypoxemia: Secondary | ICD-10-CM

## 2017-10-23 DIAGNOSIS — C61 Malignant neoplasm of prostate: Secondary | ICD-10-CM | POA: Diagnosis not present

## 2017-10-23 DIAGNOSIS — Z9841 Cataract extraction status, right eye: Secondary | ICD-10-CM | POA: Insufficient documentation

## 2017-10-23 DIAGNOSIS — R0600 Dyspnea, unspecified: Secondary | ICD-10-CM | POA: Diagnosis present

## 2017-10-23 DIAGNOSIS — Z7951 Long term (current) use of inhaled steroids: Secondary | ICD-10-CM | POA: Insufficient documentation

## 2017-10-23 DIAGNOSIS — I5042 Chronic combined systolic (congestive) and diastolic (congestive) heart failure: Secondary | ICD-10-CM | POA: Diagnosis present

## 2017-10-23 DIAGNOSIS — T462X5A Adverse effect of other antidysrhythmic drugs, initial encounter: Secondary | ICD-10-CM

## 2017-10-23 DIAGNOSIS — R404 Transient alteration of awareness: Secondary | ICD-10-CM | POA: Diagnosis not present

## 2017-10-23 DIAGNOSIS — I4581 Long QT syndrome: Secondary | ICD-10-CM | POA: Diagnosis not present

## 2017-10-23 DIAGNOSIS — I480 Paroxysmal atrial fibrillation: Secondary | ICD-10-CM | POA: Diagnosis present

## 2017-10-23 DIAGNOSIS — Z9842 Cataract extraction status, left eye: Secondary | ICD-10-CM | POA: Insufficient documentation

## 2017-10-23 DIAGNOSIS — Z952 Presence of prosthetic heart valve: Secondary | ICD-10-CM | POA: Insufficient documentation

## 2017-10-23 DIAGNOSIS — E7800 Pure hypercholesterolemia, unspecified: Secondary | ICD-10-CM | POA: Diagnosis not present

## 2017-10-23 DIAGNOSIS — I471 Supraventricular tachycardia: Secondary | ICD-10-CM | POA: Insufficient documentation

## 2017-10-23 DIAGNOSIS — I110 Hypertensive heart disease with heart failure: Secondary | ICD-10-CM | POA: Diagnosis not present

## 2017-10-23 DIAGNOSIS — R51 Headache: Secondary | ICD-10-CM | POA: Insufficient documentation

## 2017-10-23 DIAGNOSIS — Z8551 Personal history of malignant neoplasm of bladder: Secondary | ICD-10-CM | POA: Insufficient documentation

## 2017-10-23 DIAGNOSIS — R9431 Abnormal electrocardiogram [ECG] [EKG]: Secondary | ICD-10-CM

## 2017-10-23 DIAGNOSIS — E039 Hypothyroidism, unspecified: Secondary | ICD-10-CM | POA: Diagnosis not present

## 2017-10-23 DIAGNOSIS — I447 Left bundle-branch block, unspecified: Secondary | ICD-10-CM | POA: Diagnosis not present

## 2017-10-23 DIAGNOSIS — Z79899 Other long term (current) drug therapy: Secondary | ICD-10-CM | POA: Insufficient documentation

## 2017-10-23 DIAGNOSIS — I2510 Atherosclerotic heart disease of native coronary artery without angina pectoris: Secondary | ICD-10-CM | POA: Diagnosis not present

## 2017-10-23 DIAGNOSIS — I6529 Occlusion and stenosis of unspecified carotid artery: Secondary | ICD-10-CM | POA: Insufficient documentation

## 2017-10-23 DIAGNOSIS — R0609 Other forms of dyspnea: Secondary | ICD-10-CM

## 2017-10-23 DIAGNOSIS — R5383 Other fatigue: Secondary | ICD-10-CM | POA: Diagnosis present

## 2017-10-23 DIAGNOSIS — Z888 Allergy status to other drugs, medicaments and biological substances status: Secondary | ICD-10-CM | POA: Insufficient documentation

## 2017-10-23 DIAGNOSIS — Z881 Allergy status to other antibiotic agents status: Secondary | ICD-10-CM | POA: Insufficient documentation

## 2017-10-23 DIAGNOSIS — Z950 Presence of cardiac pacemaker: Secondary | ICD-10-CM | POA: Insufficient documentation

## 2017-10-23 DIAGNOSIS — M19041 Primary osteoarthritis, right hand: Secondary | ICD-10-CM | POA: Insufficient documentation

## 2017-10-23 DIAGNOSIS — Z961 Presence of intraocular lens: Secondary | ICD-10-CM | POA: Insufficient documentation

## 2017-10-23 DIAGNOSIS — M479 Spondylosis, unspecified: Secondary | ICD-10-CM | POA: Insufficient documentation

## 2017-10-23 HISTORY — DX: Abnormal results of pulmonary function studies: R94.2

## 2017-10-23 HISTORY — DX: Syncope and collapse: R55

## 2017-10-23 HISTORY — DX: Other disorders of lung: J98.4

## 2017-10-23 LAB — URINALYSIS, ROUTINE W REFLEX MICROSCOPIC
BILIRUBIN URINE: NEGATIVE
Glucose, UA: NEGATIVE mg/dL
Hgb urine dipstick: NEGATIVE
Ketones, ur: NEGATIVE mg/dL
Leukocytes, UA: NEGATIVE
Nitrite: NEGATIVE
PH: 6 (ref 5.0–8.0)
Protein, ur: NEGATIVE mg/dL
SPECIFIC GRAVITY, URINE: 1.008 (ref 1.005–1.030)

## 2017-10-23 LAB — COMPREHENSIVE METABOLIC PANEL
ALBUMIN: 3.1 g/dL — AB (ref 3.5–5.0)
ALT: 81 U/L — ABNORMAL HIGH (ref 17–63)
AST: 58 U/L — ABNORMAL HIGH (ref 15–41)
Alkaline Phosphatase: 110 U/L (ref 38–126)
Anion gap: 9 (ref 5–15)
BUN: 21 mg/dL — AB (ref 6–20)
CO2: 24 mmol/L (ref 22–32)
CREATININE: 1.01 mg/dL (ref 0.61–1.24)
Calcium: 8.6 mg/dL — ABNORMAL LOW (ref 8.9–10.3)
Chloride: 105 mmol/L (ref 101–111)
GFR calc Af Amer: >60 mL/min (ref >60)
GFR calc non Af Amer: >60 mL/min (ref >60)
Glucose, Bld: 104 mg/dL — ABNORMAL HIGH (ref 65–99)
Potassium: 3.7 mmol/L (ref 3.5–5.1)
Sodium: 138 mmol/L (ref 135–145)
Total Bilirubin: 0.8 mg/dL (ref 0.3–1.2)
Total Protein: 6.3 g/dL — ABNORMAL LOW (ref 6.5–8.1)

## 2017-10-23 LAB — FERRITIN: FERRITIN: 64 ng/mL (ref 24–336)

## 2017-10-23 LAB — IRON AND TIBC
Iron: 22 ug/dL — ABNORMAL LOW (ref 45–182)
SATURATION RATIOS: 8 % — AB (ref 17.9–39.5)
TIBC: 266 ug/dL (ref 250–450)
UIBC: 244 ug/dL

## 2017-10-23 LAB — POC OCCULT BLOOD, ED: Fecal Occult Bld: NEGATIVE

## 2017-10-23 LAB — CUP PACEART INCLINIC DEVICE CHECK
Date Time Interrogation Session: 20190524084240
Implantable Lead Implant Date: 20171020
Implantable Lead Location: 753859
Implantable Lead Model: 5076
Implantable Pulse Generator Implant Date: 20171020
MDC IDC LEAD IMPLANT DT: 20171020
MDC IDC LEAD LOCATION: 753860

## 2017-10-23 LAB — TROPONIN I: Troponin I: <0.03 ng/mL (ref <0.03)

## 2017-10-23 LAB — VITAMIN B12: Vitamin B-12: 823 pg/mL (ref 180–914)

## 2017-10-23 LAB — CBC WITH DIFFERENTIAL/PLATELET
BASOS ABS: 0 10*3/uL (ref 0.0–0.1)
Basophils Relative: 1 %
Eosinophils Absolute: 0.1 10*3/uL (ref 0.0–0.7)
Eosinophils Relative: 1 %
HCT: 35.6 % — ABNORMAL LOW (ref 39.0–52.0)
Hemoglobin: 11.4 g/dL — ABNORMAL LOW (ref 13.0–17.0)
LYMPHS ABS: 0.9 10*3/uL (ref 0.7–4.0)
LYMPHS PCT: 11 %
MCH: 25.6 pg — AB (ref 26.0–34.0)
MCHC: 32 g/dL (ref 30.0–36.0)
MCV: 80 fL (ref 78.0–100.0)
MONO ABS: 0.7 10*3/uL (ref 0.1–1.0)
Monocytes Relative: 8 %
NEUTROS ABS: 6.9 10*3/uL (ref 1.7–7.7)
Neutrophils Relative %: 79 %
Platelets: 214 10*3/uL (ref 150–400)
RBC: 4.45 MIL/uL (ref 4.22–5.81)
RDW: 17.3 % — ABNORMAL HIGH (ref 11.5–15.5)
WBC: 8.7 10*3/uL (ref 4.0–10.5)

## 2017-10-23 LAB — CBG MONITORING, ED: Glucose-Capillary: 113 mg/dL — ABNORMAL HIGH (ref 65–99)

## 2017-10-23 LAB — D-DIMER, QUANTITATIVE (NOT AT ARMC): D DIMER QUANT: 0.57 ug{FEU}/mL — AB (ref 0.00–0.50)

## 2017-10-23 LAB — BRAIN NATRIURETIC PEPTIDE: B Natriuretic Peptide: 146.7 pg/mL — ABNORMAL HIGH (ref 0.0–100.0)

## 2017-10-23 LAB — TSH: TSH: 1.162 u[IU]/mL (ref 0.350–4.500)

## 2017-10-23 MED ORDER — METOPROLOL SUCCINATE ER 25 MG PO TB24
25.0000 mg | ORAL_TABLET | Freq: Every day | ORAL | Status: DC
  Administered 2017-10-24: 25 mg via ORAL
  Filled 2017-10-23: qty 1

## 2017-10-23 MED ORDER — ALBUTEROL SULFATE (2.5 MG/3ML) 0.083% IN NEBU: 2.5000 mg | INHALATION_SOLUTION | Freq: Three times a day (TID) | RESPIRATORY_TRACT | Status: DC | PRN

## 2017-10-23 MED ORDER — IPRATROPIUM-ALBUTEROL 0.5-2.5 (3) MG/3ML IN SOLN: 3.0000 mL | Freq: Four times a day (QID) | RESPIRATORY_TRACT | Status: DC | PRN

## 2017-10-23 MED ORDER — BENZONATATE 100 MG PO CAPS: 100.0000 mg | ORAL_CAPSULE | Freq: Three times a day (TID) | ORAL | Status: DC

## 2017-10-23 MED ORDER — ONDANSETRON HCL 4 MG PO TABS: 4.0000 mg | ORAL_TABLET | Freq: Four times a day (QID) | ORAL | Status: DC | PRN

## 2017-10-23 MED ORDER — ACETAMINOPHEN-CODEINE #3 300-30 MG PO TABS
1.0000 | ORAL_TABLET | Freq: Four times a day (QID) | ORAL | Status: DC | PRN
  Administered 2017-10-23: 1 via ORAL
  Filled 2017-10-23: qty 1

## 2017-10-23 MED ORDER — BENZONATATE 100 MG PO CAPS
100.0000 mg | ORAL_CAPSULE | Freq: Three times a day (TID) | ORAL | Status: DC
  Administered 2017-10-23 – 2017-10-24 (×3): 100 mg via ORAL
  Filled 2017-10-23 (×3): qty 1

## 2017-10-23 MED ORDER — BUDESONIDE 0.5 MG/2ML IN SUSP: 0.5000 mg | Freq: Three times a day (TID) | RESPIRATORY_TRACT | Status: DC | PRN

## 2017-10-23 MED ORDER — SODIUM CHLORIDE 0.9 % IV SOLN
INTRAVENOUS | Status: DC
  Administered 2017-10-23: 15:00:00 via INTRAVENOUS

## 2017-10-23 MED ORDER — ATORVASTATIN CALCIUM 20 MG PO TABS
20.0000 mg | ORAL_TABLET | Freq: Every day | ORAL | Status: DC
  Administered 2017-10-24: 20 mg via ORAL
  Filled 2017-10-23: qty 1

## 2017-10-23 MED ORDER — BUTALBITAL-APAP-CAFFEINE 50-325-40 MG PO TABS: 1.0000 | ORAL_TABLET | Freq: Four times a day (QID) | ORAL | Status: DC | PRN

## 2017-10-23 MED ORDER — MOMETASONE FURO-FORMOTEROL FUM 100-5 MCG/ACT IN AERO
2.0000 | INHALATION_SPRAY | Freq: Two times a day (BID) | RESPIRATORY_TRACT | Status: DC
  Administered 2017-10-24: 2 via RESPIRATORY_TRACT
  Filled 2017-10-23: qty 8.8

## 2017-10-23 MED ORDER — LEVOTHYROXINE SODIUM 25 MCG PO TABS
12.5000 ug | ORAL_TABLET | Freq: Every day | ORAL | Status: DC
  Administered 2017-10-24: 12.5 ug via ORAL
  Filled 2017-10-23: qty 1

## 2017-10-23 MED ORDER — ACETAMINOPHEN 325 MG PO TABS: 650.0000 mg | ORAL_TABLET | Freq: Four times a day (QID) | ORAL | Status: DC | PRN

## 2017-10-23 MED ORDER — ONDANSETRON HCL 4 MG/2ML IJ SOLN: 4.0000 mg | Freq: Four times a day (QID) | INTRAMUSCULAR | Status: DC | PRN

## 2017-10-23 MED ORDER — FLUTICASONE PROPIONATE 50 MCG/ACT NA SUSP
2.0000 | Freq: Every day | NASAL | Status: DC
  Administered 2017-10-23 – 2017-10-24 (×2): 2 via NASAL
  Filled 2017-10-23: qty 16

## 2017-10-23 MED ORDER — LATANOPROST 0.005 % OP SOLN
1.0000 [drp] | Freq: Every day | OPHTHALMIC | Status: DC
  Administered 2017-10-23: 1 [drp] via OPHTHALMIC
  Filled 2017-10-23: qty 2.5

## 2017-10-23 MED ORDER — IPRATROPIUM-ALBUTEROL 0.5-2.5 (3) MG/3ML IN SOLN: 3.0000 mL | Freq: Four times a day (QID) | RESPIRATORY_TRACT | 5 refills | Status: DC | PRN

## 2017-10-23 MED ORDER — SODIUM CHLORIDE 0.9% FLUSH
3.0000 mL | Freq: Two times a day (BID) | INTRAVENOUS | Status: DC
  Administered 2017-10-23 – 2017-10-24 (×2): 3 mL via INTRAVENOUS

## 2017-10-23 MED ORDER — ACETAMINOPHEN 650 MG RE SUPP: 650.0000 mg | Freq: Four times a day (QID) | RECTAL | Status: DC | PRN

## 2017-10-23 MED ORDER — SENNOSIDES-DOCUSATE SODIUM 8.6-50 MG PO TABS: 1.0000 | ORAL_TABLET | Freq: Every evening | ORAL | Status: DC | PRN

## 2017-10-23 MED ORDER — APIXABAN 2.5 MG PO TABS
2.5000 mg | ORAL_TABLET | Freq: Two times a day (BID) | ORAL | Status: DC
  Administered 2017-10-23 – 2017-10-24 (×2): 2.5 mg via ORAL
  Filled 2017-10-23 (×2): qty 1

## 2017-10-23 MED ORDER — ORAL CARE MOUTH RINSE
15.0000 mL | Freq: Two times a day (BID) | OROMUCOSAL | Status: DC
  Administered 2017-10-24: 15 mL via OROMUCOSAL

## 2017-10-23 NOTE — ED Provider Notes (Signed)
[facility] DEPT Provider Note   CSN: [id number] Arrival date & time: 10/23/17  1319     History   Chief Complaint Chief Complaint  Patient presents with  . Loss of Consciousness    HPI Ryan [name] is a 84 y.o. male who presents to the emergency department with chief complaint of syncope.  Patient has a past medical history of peripheral vascular disease, asthma, hypertension, chronic A. fib and left bundle branch block on EKG status post pacemaker.  He is followed by Dr. Lovena Le of EP Dr. Meda Coffee in cardiology.  The patient has had about 5 weeks of a cough According to the patient's son-in-law, Dr. Loletha Carrow , patient symptoms began as a URI about 5 weeks ago.  He apparently had a period where he improved but then his cough came back and he has had progressive decline.  He has had multiple visits with primary doctors and specialist over the past 2 weeks.  He has had severe exertional dyspnea and cough and has been essentially unable to go about his ADLs. He was taken off of his amiodarone yesterday secondary to diagnosis of pulmonary toxicity by [facility] pulmonology .   Patient states that he was sitting in his car with his wife when he had sudden onset of vertiginous symptoms and then lost consciousness for about 6 seconds.  His wife helped him back into the house.  His daughter came by and then his son-in-law who brought a pulse oximeter.  The patient's pulse ox was noted to be in the 80s but would improve with deep inhalation.He was ashen and diaphoretic, but his Pulse and HR were normal.   He has no previous history of hypoxia or oxygen dependence.  Patient states that he has no previous episode of syncope.  His daughter who is at bedside states that recently he had significant peripheral edema and took 3 days of Lasix to diurese.  He was seen by his cardiologist who did not feel that he had any CHF as the cause.  He was also called by the pulmonologist 2 days ago to  let him know that he was anemic.  Review of EMR shows that he has a 3 g drop in his hemoglobin over the past month.  The patient is on daily Eliquis but denies any noticeable black or tarry colored stools.  He denied unilateral weakness, difficulty with speech.  His daughter said that he was pale, gray and sweaty.  Patient did not have any chest pain, palpitations.  He has no previous history of GI bleeds.  HPI  Past Medical History:  Diagnosis Date  . Age-related macular degeneration   . Allergic rhinitis   . Aortic valve stenosis   . Arthritis    "hands, lower back" (04/30/2016)  . Asthma   . Carotid artery obstruction   . Chronic lower back pain   . Coronary arteriosclerosis in native artery   . Diverticulitis of colon   . DJD (degenerative joint disease)   . Dyspnea    "constant but the degree changes"  . Dysthymia   . GERD (gastroesophageal reflux disease)   . History of elevated PSA 05/2009  . History of hiatal hernia   . HLD (hyperlipidemia)   . Hypertension   . Nocturnal hypoxemia   . OSA (obstructive sleep apnea)    intolerant to CPAP  . Paroxysmal supraventricular tachycardia ([place])   . Pneumonia    "when I was a kid"  . Presence of  permanent cardiac pacemaker   . Primary malignant neoplasm of bladder ([place])   . Prostate cancer [facility])     Patient Active Problem List   Diagnosis Date Noted  . Syncope 10/23/2017  . Decreased diffusion capacity 10/23/2017  . 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 Elevated TSH 06/30/2017  . Anticoagulation management encounter 08/19/2016  . CAD (coronary artery disease), native coronary artery 04/30/2016  . Tachy-brady syndrome ([place])   . Hypertensive heart disease with heart failure ([place])   . Pure hypercholesterolemia   . PAF (paroxysmal atrial fibrillation) ([place]) 03/18/2016  . Chronic combined systolic (congestive) and diastolic (congestive) heart failure ([place]) 03/18/2016  . Atrial fibrillation with RVR ([place]) 03/18/2016  .  Atrial fibrillation with rapid ventricular response ([place]) 03/18/2016  . Fatigue 01/17/2016  . DOE (dyspnea on exertion) 01/17/2016  . Coronary artery disease of native artery of native heart with stable angina pectoris ([place])   . Severe aortic stenosis 10/12/2015    Past Surgical History:  Procedure Laterality Date  . APPENDECTOMY    . CARDIAC CATHETERIZATION N/A 01/17/2016   Procedure: Right/Left Heart Cath and Coronary Angiography;  Surgeon: Belva Crome, MD;  Location: [place] CV LAB;  Service: Cardiovascular;  Laterality: N/A;  . CARDIAC CATHETERIZATION N/A 04/30/2016   Procedure: Coronary/Graft Atherectomy;  Surgeon: Sherren Mocha, MD;  Location: [place] CV LAB;  Service: Cardiovascular;  Laterality: N/A;  . CAROTID ENDARTERECTOMY Bilateral   . CATARACT EXTRACTION W/ INTRAOCULAR LENS  IMPLANT, BILATERAL Bilateral   . COLON SURGERY  1981   Meckles Diverticulum with volvulus  . EP IMPLANTABLE DEVICE N/A 03/21/2016   Procedure: Pacemaker Implant;  Surgeon: Evans Lance, MD;  Location: [place] CV LAB;  Service: Cardiovascular;  Laterality: N/A;  . INGUINAL HERNIA REPAIR Right 2009  . INSERTION PROSTATE RADIATION SEED    . TEE WITHOUT CARDIOVERSION N/A 06/10/2016   Procedure: TRANSESOPHAGEAL ECHOCARDIOGRAM (TEE);  Surgeon: Sherren Mocha, MD;  Location: [place];  Service: Open Heart Surgery;  Laterality: N/A;  . TONSILLECTOMY  ~ 1947  . TRANSCATHETER AORTIC VALVE REPLACEMENT, TRANSFEMORAL N/A 06/10/2016   Procedure: TRANSCATHETER AORTIC VALVE REPLACEMENT, TRANSFEMORAL;  Surgeon: Sherren Mocha, MD;  Location: [place];  Service: Open Heart Surgery;  Laterality: N/A;        Home Medications    Prior to Admission medications   Medication Sig Start Date End Date Taking? Authorizing Provider  acetaminophen (TYLENOL) 500 MG tablet Take 500 mg by mouth as needed for mild pain.    Yes [provider]  acetaminophen-codeine (TYLENOL #3) 300-30 MG tablet Take 1 tablet by  mouth every 6 (six) hours as needed for up to 10 days (cough). 10/13/17 10/23/17 Yes Nafziger, Tommi Rumps, NP  albuterol (PROVENTIL) (2.5 MG/3ML) 0.083% nebulizer solution Take 3 mLs (2.5 mg total) by nebulization every 8 (eight) hours as needed for wheezing or shortness of breath. 10/13/17  Yes Nafziger, Tommi Rumps, NP  amLODipine (NORVASC) 5 MG tablet Take 1 tablet (5 mg total) by mouth daily. 08/12/17  Yes Dorothy Spark, MD  apixaban (ELIQUIS) 2.5 MG TABS tablet Take 1 tablet (2.5 mg total) by mouth 2 (two) times daily. 10/22/17  Yes Evans Lance, MD  atorvastatin (LIPITOR) 20 MG tablet Take 1 tablet (20 mg total) by mouth daily. 11/24/16  Yes Dorothy Spark, MD  benzonatate (TESSALON) 200 MG capsule Take 1 capsule (200 mg total) by mouth 2 (two) times daily as needed for cough. 10/14/17  Yes Nafziger, Tommi Rumps, NP  budesonide (PULMICORT)  0.5 MG/2ML nebulizer solution Take 2 mLs (0.5 mg total) by nebulization every 8 (eight) hours as needed. 09/11/17  Yes Nafziger, Tommi Rumps, NP  budesonide-formoterol (SYMBICORT) 80-4.5 MCG/ACT inhaler Inhale 2 puffs into the lungs 2 (two) times daily. 10/20/17  Yes Lauraine Rinne, NP  cholecalciferol (VITAMIN D) 1000 units tablet Take 1,000 Units by mouth 2 (two) times a week.    Yes [provider]  Coenzyme Q10 (COQ-10 PO) Take 1 capsule by mouth 2 (two) times a week.    Yes [provider]  Cyanocobalamin (VITAMIN B 12 PO) Take 1 capsule by mouth daily.   Yes [provider]  latanoprost (XALATAN) 0.005 % ophthalmic solution Place 1 drop into both eyes at bedtime.   Yes [provider]  levothyroxine (SYNTHROID, LEVOTHROID) 25 MCG tablet Take 0.5 tablets (12.5 mcg total) by mouth daily before breakfast. 10/21/17  Yes Dorothy Spark, MD  metoprolol succinate (TOPROL XL) 25 MG 24 hr tablet Take 1 tablet (25 mg total) by mouth daily. 07/15/17 07/10/18 Yes Dorothy Spark, MD  furosemide (LASIX) 20 MG tablet Take 1 tablet (20 mg total) by mouth  daily as needed for edema. 10/22/17 01/20/18  Dorothy Spark, MD  ipratropium-albuterol (DUONEB) 0.5-2.5 (3) MG/3ML SOLN Take 3 mLs by nebulization every 6 (six) hours as needed. Dx: R06.09 10/23/17   Lauraine Rinne, NP  nitroGLYCERIN (NITROSTAT) 0.4 MG SL tablet Place 1 tablet (0.4 mg total) under the tongue every 5 (five) minutes as needed. 05/01/16   Cheryln Manly, NP    Family History Family History  Problem Relation Age of Onset  . Heart disease Mother   . Heart disease Father     Social History Social History   Tobacco Use  . Smoking status: Never Smoker  . Smokeless tobacco: Never Used  Substance Use Topics  . Alcohol use: No    Alcohol/week: 0.0 oz    Comment: 04/30/2016 "nothing in years"  . Drug use: No     Allergies   Ciprofloxacin; Hydrochlorothiazide; Sulfa antibiotics; Ace inhibitors; Losartan; Carvedilol; and Lisinopril   Review of Systems Review of Systems Ten systems reviewed and are negative for acute change, except as noted in the HPI.    Physical Exam Updated Vital Signs BP 130/61 (BP Location: Left Arm)   Pulse 62   Temp 98.5 F (36.9 C) (Oral)   Resp 18   Ht 5\' 6"  (1.676 m)   Wt 66 kg (145 lb 9.6 oz)   SpO2 95%   BMI 23.50 kg/m   Physical Exam  Constitutional: He is oriented to person, place, and time. He appears well-developed and well-nourished. No distress.  Appears fatigued  HENT:  Head: Normocephalic and atraumatic.  Mouth/Throat: Oropharynx is clear and moist.  Eyes: Pupils are equal, round, and reactive to light. Conjunctivae and EOM are normal. No scleral icterus.  No horizontal, vertical or rotational nystagmus  Neck: Normal range of motion. Neck supple.  Cardiovascular: Normal rate, regular rhythm, normal heart sounds and intact distal pulses.  Pulmonary/Chest: No respiratory distress. He has no rales.  With tight, wheezy, bronchitic cough.  Patient coughs to the point of breathlessness  Abdominal: Soft. Bowel sounds are  normal. There is no tenderness. There is no rebound and no guarding.  Musculoskeletal: Normal range of motion. He exhibits no edema.  Lymphadenopathy:    He has no cervical adenopathy.  Neurological: He is alert and oriented to person, place, and time. He has normal reflexes. No  cranial nerve deficit. He exhibits normal muscle tone. Coordination normal.  Mental Status:  Alert, oriented, thought content appropriate. Speech fluent without evidence of aphasia. Able to follow 2 step commands without difficulty.  Cranial Nerves:  II:  Peripheral visual fields grossly normal, pupils equal, round, reactive to light III,IV, VI: ptosis not present, extra-ocular motions intact bilaterally  V,VII: smile symmetric, facial light touch sensation equal VIII: hearing grossly normal bilaterally  IX,X: midline uvula rise  XI: bilateral shoulder shrug equal and strong XII: midline tongue extension  Motor:  5/5 in upper and lower extremities bilaterally including strong and equal grip strength and dorsiflexion/plantar flexion Sensory: Pinprick and light touch normal in all extremities.  Deep Tendon Reflexes: 2+ and symmetric  Cerebellar: normal finger-to-nose with bilateral upper extremities CV: distal pulses palpable throughout   Skin: Skin is warm and dry. No rash noted. He is not diaphoretic.  Psychiatric: He has a normal mood and affect. His behavior is normal. Judgment and thought content normal.  Nursing note and vitals reviewed.    ED Treatments / Results  Labs (all labs ordered are listed, but only abnormal results are displayed) Labs Reviewed  CBC WITH DIFFERENTIAL/PLATELET - Abnormal; Notable for the following components:      Result Value   Hemoglobin 11.4 (*)    HCT 35.6 (*)    MCH 25.6 (*)    RDW 17.3 (*)    All other components within normal limits  COMPREHENSIVE METABOLIC PANEL - Abnormal; Notable for the following components:   Glucose, Bld 104 (*)    BUN 21 (*)    Calcium 8.6  (*)    Total Protein 6.3 (*)    Albumin 3.1 (*)    AST 58 (*)    ALT 81 (*)    All other components within normal limits  D-DIMER, QUANTITATIVE (NOT AT [facility]) - Abnormal; Notable for the following components:   D-Dimer, Quant 0.57 (*)    All other components within normal limits  BRAIN NATRIURETIC PEPTIDE - Abnormal; Notable for the following components:   B Natriuretic Peptide 146.7 (*)    All other components within normal limits  IRON AND TIBC - Abnormal; Notable for the following components:   Iron 22 (*)    Saturation Ratios 8 (*)    All other components within normal limits  CBG MONITORING, ED - Abnormal; Notable for the following components:   Glucose-Capillary 113 (*)    All other components within normal limits  URINALYSIS, ROUTINE W REFLEX MICROSCOPIC  TROPONIN I  TSH  FERRITIN  VITAMIN B12  TROPONIN I  TROPONIN I  POC OCCULT BLOOD, ED    EKG EKG Interpretation  Date/Time:  Friday Oct 23 2017 13:41:24 EDT Ventricular Rate:  67 PR Interval:    QRS Duration: 139 QT Interval:  511 QTC Calculation: 540 R Axis:   82 Text Interpretation:  Sinus rhythm Anterior infarct, old Prolonged QT interval Baseline wander in lead(s) V2 Left bundle branch block since last tracing no significant change Confirmed by Malvin Johns [phone]) on 10/23/2017 2:32:54 PM   Radiology Dg Chest 2 View  Result Date: 10/23/2017 CLINICAL DATA:  Syncope. EXAM: CHEST - 2 VIEW COMPARISON:  Radiographs of Oct 14, 2017. FINDINGS: Stable cardiomediastinal silhouette. Aortic valve prosthesis is noted. Left-sided pacemaker is unchanged in position. No pneumothorax is noted. No consolidative process is noted. Small bilateral pleural effusions are noted posteriorly. The visualized skeletal structures are unremarkable. IMPRESSION: Small bilateral pleural effusions. No other significant abnormality seen  in the chest. Electronically Signed   By: Marijo Conception, M.D.   On: 10/23/2017 15:25   Ct Head Wo  Contrast  Result Date: 10/23/2017 CLINICAL DATA:  85 y/o  M; syncope. EXAM: CT HEAD WITHOUT CONTRAST TECHNIQUE: Contiguous axial images were obtained from the base of the skull through the vertex without intravenous contrast. COMPARISON:  None. FINDINGS: Brain: No evidence of acute infarction, hemorrhage, hydrocephalus, extra-axial collection or mass lesion/mass effect. Mild chronic microvascular ischemic changes and parenchymal volume loss of the brain for age. Small chronic lacunar infarctions within left caudate body and thalamus. Vascular: Calcific atherosclerosis of carotid siphons. No hyperdense vessel identified. Skull: Normal. Negative for fracture or focal lesion. Sinuses/Orbits: No acute finding. Other: 16 mm lipoma within the left suboccipital myocutaneous junction. IMPRESSION: 1. No acute intracranial abnormality identified. 2. Mild for age chronic microvascular ischemic changes and parenchymal volume loss of the brain. Electronically Signed   By: Kristine Garbe M.D.   On: 10/23/2017 14:30    Procedures Procedures (including critical care time)  Medications Ordered in ED Medications  acetaminophen-codeine (TYLENOL #3) 300-30 MG per tablet 1 tablet (1 tablet Oral Given 10/23/17 2026)  apixaban (ELIQUIS) tablet 2.5 mg (2.5 mg Oral Given 10/23/17 2227)  atorvastatin (LIPITOR) tablet 20 mg (has no administration in time range)  budesonide (PULMICORT) nebulizer solution 0.5 mg (has no administration in time range)  mometasone-formoterol (DULERA) 100-5 MCG/ACT inhaler 2 puff (2 puffs Inhalation Not Given 10/23/17 2021)  ipratropium-albuterol (DUONEB) 0.5-2.5 (3) MG/3ML nebulizer solution 3 mL (has no administration in time range)  latanoprost (XALATAN) 0.005 % ophthalmic solution 1 drop (1 drop Both Eyes Given 10/23/17 2225)  levothyroxine (SYNTHROID, LEVOTHROID) tablet 12.5 mcg (has no administration in time range)  metoprolol succinate (TOPROL-XL) 24 hr tablet 25 mg (has no  administration in time range)  sodium chloride flush (NS) 0.9 % injection 3 mL (3 mLs Intravenous Given 10/23/17 2228)  acetaminophen (TYLENOL) tablet 650 mg (has no administration in time range)    Or  acetaminophen (TYLENOL) suppository 650 mg (has no administration in time range)  senna-docusate (Senokot-S) tablet 1 tablet (has no administration in time range)  ondansetron (ZOFRAN) tablet 4 mg (has no administration in time range)    Or  ondansetron (ZOFRAN) injection 4 mg (has no administration in time range)  fluticasone (FLONASE) 50 MCG/ACT nasal spray 2 spray (2 sprays Each Nare Given 10/23/17 2227)  butalbital-acetaminophen-caffeine (FIORICET, ESGIC) 50-325-40 MG per tablet 1 tablet (has no administration in time range)  benzonatate (TESSALON) capsule 100 mg (100 mg Oral Given 10/23/17 2227)     Initial Impression / Assessment and Plan / ED Course  I have reviewed the triage vital signs and the nursing notes.  Pertinent labs & imaging results that were available during my care of the patient were reviewed by me and considered in my medical decision making (see chart for details).  Clinical Course as of Oct 23 2312  Fri Oct 23, 2017  1556 POC occult blood, ED [AH]  1558 ALT(!): 81 [AH]  1559 AST(!): 58 [AH]  1729 Patient desatureated to to 78% when ambulating   [AH]    Clinical Course User Index [AH] Margarita Mail, PA-C    Patient with syncope, hypoxia with ambulation.  This may be secondary to the patient's amiodarone toxicity.  I have had his pacemaker interrogated which showed no events during the syncopal event today and his pacemaker appears to be appropriately working.  Negative for occult blood stool and  his anemia does not appear to be acute.  Patient will be admitted by the hospitalist service. Final Clinical Impressions(s) / ED Diagnoses   Final diagnoses:  Syncope, unspecified syncope type  Hypoxia  SOB (shortness of breath)  QT prolongation    ED Discharge  Orders    None       Margarita Mail, PA-C 10/23/17 2315    Malvin Johns, MD 10/26/17 [phone]

## 2017-10-23 NOTE — Addendum Note (Signed)
Addended by: Parke Poisson E on: 10/23/2017 03:47 PM   Modules accepted: Orders

## 2017-10-23 NOTE — ED Notes (Signed)
Pt has urinal and made aware of need for urine specimen. 

## 2017-10-23 NOTE — ED Notes (Signed)
Pt back from x-ray.

## 2017-10-23 NOTE — ED Notes (Signed)
Bed: KL49 Expected date:  Expected time:  Means of arrival:  Comments: 84 yo syncope

## 2017-10-23 NOTE — H&P (Addendum)
[facility] History and Physical   Patient: Ryan [name] TDD:[id number]   PCP: Dorothyann Peng, NP DOB: April 29, 1930   DOA: 10/23/2017   DOS: 10/23/2017   DOS: the patient was seen and examined on 10/23/2017  Patient coming from: The patient is coming from home.  Chief Complaint: Passing out episode  HPI: Ryan [name] is a 84 y.o. male with Past medical history of aortic stenosis S/P TAVR, A. fib, CAD S/P PCI, chronic combined CHF, PVD, pacemaker implant, OSA, HTN, prostate cancer, amiodarone induced lung toxicity. Patient presented with an episode of passing.  Patient was at his baseline when he woke up this morning.  While he was trying to go out with his wife and try to sit in the car he felt that his head was spinning and he felt nauseous.  Following that he passed out in the car.  Patient was brought to the house and reportedly he was confused, pale looking, and severely fatigued with complaints of headache.  1 of the family member who is Dr., Dr. Loletha Carrow had a pulse oximetry and check his oxygen which was 86% on room air.  Patient came around and he was brought to the hospital. At the time of my evaluation patient continues to complain of frontal headache which is throbbing, no dizziness no lightheadedness no chest pain no abdominal pain no nausea no vomiting no diarrhea no burning urination. Patient has chronic swelling of his legs for which she was given Lasix for 2 days a few days ago.  Since yesterday patient has switched his Lasix to as needed as well. Patient is also using stockings for swelling of the leg. Patient is on amiodarone for A. fib.  For last 6 weeks he has been having progressive shortness of breath as well as cough on exertion.  Has been a minute by pulmonary, PFT performed shows very low DLCO and patient was diagnosed with amiodarone induced lung toxicity.  Amiodarone was discontinued no other change in the medications reported.  ED Course: Orthostatics were  negative although patient was not able to stand up for full 3 minutes due to shortness of breath.  Ambulated 40 feet and saturation dropped to 78% on room air and patient become symptomatic with dizziness as well as headache and shortness of breath.  CT of the head unremarkable, chest x-ray unremarkable, patient was referred for admission.  At his baseline ambulates with a walker And is independent for most of his ADL; manages his medication on his own.  Review of Systems: as mentioned in the history of present illness.  All other systems reviewed and are negative.  Past Medical History:  Diagnosis Date  . Age-related macular degeneration   . Allergic rhinitis   . Aortic valve stenosis   . Arthritis    "hands, lower back" (04/30/2016)  . Asthma   . Carotid artery obstruction   . Chronic lower back pain   . Coronary arteriosclerosis in native artery   . Diverticulitis of colon   . DJD (degenerative joint disease)   . Dyspnea    "constant but the degree changes"  . Dysthymia   . GERD (gastroesophageal reflux disease)   . History of elevated PSA 05/2009  . History of hiatal hernia   . HLD (hyperlipidemia)   . Hypertension   . Nocturnal hypoxemia   . OSA (obstructive sleep apnea)    intolerant to CPAP  . Paroxysmal supraventricular tachycardia ([place])   . Pneumonia    "when I was  a kid"  . Presence of permanent cardiac pacemaker   . Primary malignant neoplasm of bladder ([place])   . Prostate cancer [facility])    Past Surgical History:  Procedure Laterality Date  . APPENDECTOMY    . CARDIAC CATHETERIZATION N/A 01/17/2016   Procedure: Right/Left Heart Cath and Coronary Angiography;  Surgeon: Belva Crome, MD;  Location: [place] [facility];  Service: Cardiovascular;  Laterality: N/A;  . CARDIAC CATHETERIZATION N/A 04/30/2016   Procedure: Coronary/Graft Atherectomy;  Surgeon: Sherren Mocha, MD;  Location: [place] [facility];  Service: Cardiovascular;  Laterality: N/A;  . CAROTID  ENDARTERECTOMY Bilateral   . CATARACT EXTRACTION W/ INTRAOCULAR LENS  IMPLANT, BILATERAL Bilateral   . COLON SURGERY  1981   Meckles Diverticulum with volvulus  . EP IMPLANTABLE DEVICE N/A 03/21/2016   Procedure: Pacemaker Implant;  Surgeon: Evans Lance, MD;  Location: [place] [facility];  Service: Cardiovascular;  Laterality: N/A;  . INGUINAL HERNIA REPAIR Right 2009  . INSERTION PROSTATE RADIATION SEED    . TEE WITHOUT CARDIOVERSION N/A 06/10/2016   Procedure: TRANSESOPHAGEAL ECHOCARDIOGRAM (TEE);  Surgeon: Sherren Mocha, MD;  Location: [place];  Service: Open Heart Surgery;  Laterality: N/A;  . TONSILLECTOMY  ~ 1947  . TRANSCATHETER AORTIC VALVE REPLACEMENT, TRANSFEMORAL N/A 06/10/2016   Procedure: TRANSCATHETER AORTIC VALVE REPLACEMENT, TRANSFEMORAL;  Surgeon: Sherren Mocha, MD;  Location: [place];  Service: Open Heart Surgery;  Laterality: N/A;   Social History:  reports that he has never smoked. He has never used smokeless tobacco. He reports that he does not drink alcohol or use drugs.  Allergies  Allergen Reactions  . Ciprofloxacin Anaphylaxis  . Hydrochlorothiazide Anaphylaxis  . Sulfa Antibiotics Anaphylaxis  . Ace Inhibitors Cough  . Losartan Cough  . Carvedilol Cough    Pt states it "causes him too cough."  . Lisinopril Cough    Pt reports worsening cough and "feeling wheezy."    Family History  Problem Relation Age of Onset  . Heart disease Mother   . Heart disease Father      Prior to Admission medications   Medication Sig Start Date End Date Taking? Authorizing Provider  acetaminophen (TYLENOL) 500 MG tablet Take 500 mg by mouth as needed for mild pain.    Yes [provider]  acetaminophen-codeine (TYLENOL #3) 300-30 MG tablet Take 1 tablet by mouth every 6 (six) hours as needed for up to 10 days (cough). 10/13/17 10/23/17 Yes Nafziger, Tommi Rumps, NP  albuterol (PROVENTIL) (2.5 MG/3ML) 0.083% nebulizer solution Take 3 mLs (2.5 mg total) by nebulization every 8  (eight) hours as needed for wheezing or shortness of breath. 10/13/17  Yes Nafziger, Tommi Rumps, NP  amLODipine (NORVASC) 5 MG tablet Take 1 tablet (5 mg total) by mouth daily. 08/12/17  Yes Dorothy Spark, MD  apixaban (ELIQUIS) 2.5 MG TABS tablet Take 1 tablet (2.5 mg total) by mouth 2 (two) times daily. 10/22/17  Yes Evans Lance, MD  atorvastatin (LIPITOR) 20 MG tablet Take 1 tablet (20 mg total) by mouth daily. 11/24/16  Yes Dorothy Spark, MD  benzonatate (TESSALON) 200 MG capsule Take 1 capsule (200 mg total) by mouth 2 (two) times daily as needed for cough. 10/14/17  Yes Nafziger, Tommi Rumps, NP  budesonide (PULMICORT) 0.5 MG/2ML nebulizer solution Take 2 mLs (0.5 mg total) by nebulization every 8 (eight) hours as needed. 09/11/17  Yes Nafziger, Tommi Rumps, NP  budesonide-formoterol (SYMBICORT) 80-4.5 MCG/ACT inhaler Inhale 2 puffs into the lungs 2 (two) times daily.  10/20/17  Yes Lauraine Rinne, NP  cholecalciferol (VITAMIN D) 1000 units tablet Take 1,000 Units by mouth 2 (two) times a week.    Yes [provider]  Coenzyme Q10 (COQ-10 PO) Take 1 capsule by mouth 2 (two) times a week.    Yes [provider]  Cyanocobalamin (VITAMIN B 12 PO) Take 1 capsule by mouth daily.   Yes [provider]  latanoprost (XALATAN) 0.005 % ophthalmic solution Place 1 drop into both eyes at bedtime.   Yes [provider]  levothyroxine (SYNTHROID, LEVOTHROID) 25 MCG tablet Take 0.5 tablets (12.5 mcg total) by mouth daily before breakfast. 10/21/17  Yes Dorothy Spark, MD  metoprolol succinate (TOPROL XL) 25 MG 24 hr tablet Take 1 tablet (25 mg total) by mouth daily. 07/15/17 07/10/18 Yes Dorothy Spark, MD  furosemide (LASIX) 20 MG tablet Take 1 tablet (20 mg total) by mouth daily as needed for edema. 10/22/17 01/20/18  Dorothy Spark, MD  ipratropium-albuterol (DUONEB) 0.5-2.5 (3) MG/3ML SOLN Take 3 mLs by nebulization every 6 (six) hours as needed. Dx: R06.09 10/23/17   Lauraine Rinne, NP  nitroGLYCERIN (NITROSTAT) 0.4 MG SL tablet Place 1 tablet (0.4 mg total) under the tongue every 5 (five) minutes as needed. 05/01/16   Cheryln Manly, NP    Physical Exam: Vitals:   10/23/17 1630 10/23/17 1700 10/23/17 1800 10/23/17 1802  BP: 131/61 (!) 151/70 127/87 127/87  Pulse: 65 69 67 72  Resp:   18 18  Temp:      TempSrc:      SpO2: 96% 92% 99% 99%  Weight:      Height:        General: Alert, Awake and Oriented to Time, Place and Person. Appear in moderate distress, affect appropriate, fatigues very easily and developed wheezing audible on exertion Eyes: PERRL, Conjunctiva normal ENT: Oral Mucosa clear moist. Neck: no JVD, no Abnormal Mass Or lumps Cardiovascular: S1 and S2 Present, aortic systolic Murmur, Peripheral Pulses Present Respiratory: increased respiratory effort, Bilateral Air entry equal and Decreased, no use of accessory muscle, basal Crackles, Occasional  wheezes Abdomen: Bowel Sound present, Soft and no tenderness, no hernia Skin: no redness, no Rash, no induration Extremities: bilateral  Pedal edema, no calf tenderness Neurologic: Grossly no focal neuro deficit. Bilaterally Equal motor strength  Labs on Admission:  CBC: Recent Labs  Lab 10/20/17 1058 10/23/17 1450  WBC 7.4 8.7  NEUTROABS  --  6.9  HGB 10.9* 11.4*  HCT 33.6* 35.6*  MCV 80.2 80.0  PLT 193.0 884   Basic Metabolic Panel: Recent Labs  Lab 10/20/17 1058 10/23/17 1450  NA 136 138  K 3.7 3.7  CL 104 105  CO2 24 24  GLUCOSE 102* 104*  BUN 20 21*  CREATININE 1.13 1.01  CALCIUM 8.3* 8.6*   GFR: Estimated Creatinine Clearance: 48.3 mL/min (by C-G formula based on SCr of 1.01 mg/dL). Liver Function Tests: Recent Labs  Lab 10/23/17 1450  AST 58*  ALT 81*  ALKPHOS 110  BILITOT 0.8  PROT 6.3*  ALBUMIN 3.1*   No results for input(s): LIPASE, AMYLASE in the last 168 hours. No results for input(s): AMMONIA in the last 168 hours. Coagulation Profile: No results for  input(s): INR, PROTIME in the last 168 hours. Cardiac Enzymes: No results for input(s): CKTOTAL, CKMB, CKMBINDEX, TROPONINI in the last 168 hours. BNP (last 3 results) Recent Labs    10/20/17 1058  PROBNP 146.0*   HbA1C:  No results for input(s): HGBA1C in the last 72 hours. CBG: Recent Labs  Lab 10/23/17 1355  GLUCAP 113*   Lipid Profile: No results for input(s): CHOL, HDL, LDLCALC, TRIG, CHOLHDL, LDLDIRECT in the last 72 hours. Thyroid Function Tests: No results for input(s): TSH, T4TOTAL, FREET4, T3FREE, THYROIDAB in the last 72 hours. Anemia Panel: No results for input(s): VITAMINB12, FOLATE, FERRITIN, TIBC, IRON, RETICCTPCT in the last 72 hours. Urine analysis:    Component Value Date/Time   COLORURINE YELLOW 10/23/2017 1450   APPEARANCEUR CLEAR 10/23/2017 1450   LABSPEC 1.008 10/23/2017 1450   PHURINE 6.0 10/23/2017 1450   GLUCOSEU NEGATIVE 10/23/2017 1450   HGBUR NEGATIVE 10/23/2017 1450   BILIRUBINUR NEGATIVE 10/23/2017 1450   KETONESUR NEGATIVE 10/23/2017 1450   PROTEINUR NEGATIVE 10/23/2017 1450   NITRITE NEGATIVE 10/23/2017 1450   LEUKOCYTESUR NEGATIVE 10/23/2017 1450    Radiological Exams on Admission: Dg Chest 2 View  Result Date: 10/23/2017 CLINICAL DATA:  Syncope. EXAM: CHEST - 2 VIEW COMPARISON:  Radiographs of Oct 14, 2017. FINDINGS: Stable cardiomediastinal silhouette. Aortic valve prosthesis is noted. Left-sided pacemaker is unchanged in position. No pneumothorax is noted. No consolidative process is noted. Small bilateral pleural effusions are noted posteriorly. The visualized skeletal structures are unremarkable. IMPRESSION: Small bilateral pleural effusions. No other significant abnormality seen in the chest. Electronically Signed   By: Marijo Conception, M.D.   On: 10/23/2017 15:25   Ct Head Wo Contrast  Result Date: 10/23/2017 CLINICAL DATA:  84 y/o  M; syncope. EXAM: CT HEAD WITHOUT CONTRAST TECHNIQUE: Contiguous axial images were obtained from the  base of the skull through the vertex without intravenous contrast. COMPARISON:  None. FINDINGS: Brain: No evidence of acute infarction, hemorrhage, hydrocephalus, extra-axial collection or mass lesion/mass effect. Mild chronic microvascular ischemic changes and parenchymal volume loss of the brain for age. Small chronic lacunar infarctions within left caudate body and thalamus. Vascular: Calcific atherosclerosis of carotid siphons. No hyperdense vessel identified. Skull: Normal. Negative for fracture or focal lesion. Sinuses/Orbits: No acute finding. Other: 16 mm lipoma within the left suboccipital myocutaneous junction. IMPRESSION: 1. No acute intracranial abnormality identified. 2. Mild for age chronic microvascular ischemic changes and parenchymal volume loss of the brain. Electronically Signed   By: Kristine Garbe M.D.   On: 10/23/2017 14:30   EKG: Independently reviewed. normal sinus rhythm, nonspecific ST and T waves changes, prolonged QT interval.  Assessment/Plan 1. Syncope Presented with a syncope. Etiology unclear at present but most likely acute hypoxia developing progressively over last few weeks is the cause. CT head unremarkable, neurological examination unremarkable.  Do not suspect that patient has neurologic cause of syncope. Chest x-ray clear for pneumonia, d-dimer minimally elevated, EKG shows sinus rhythm. Patient has a pacemaker. QT is prolonged and patient was on amiodarone until yesterday. Elect lites are stable. We will monitor the patient on telemetry for now. Get pacemaker evaluated tomorrow. Check echocardiogram tomorrow. Hold amlodipine for now. Recheck orthostatic vitals in the morning as well as PT and OT evaluation tomorrow morning.  2.  Severe aortic stenosis S/P TAVR. Chronic combined systolic and diastolic CHF. A. fib. DOE. Fatigue. CAD. PAD. Only on Eliquis for now. Which I will continue. Patient was in Lasix for 2 days and it has been switched  to PRN for now. Given the presentation with syncope I will hold off on Lasix although check BMP. Also check echocardiogram. Hold Norvasc, start metoprolol tomorrow.  3.  Headache. ?  Sinus. Fioricet as needed, Tylenol as  needed. Flonase scheduled.  4.  Acute hypoxic respiratory failure. Appears to actually how progressively worsening chronic hypoxic respiratory failure. From amiodarone lung toxicity. Serial troponin, check BNP. D-dimer is not elevated. Echocardiogram pending. Continue nebulizers as needed.  5.  Chronic cough. Change Tessalon from PRN to schedule. Continue Tylenol 3 for cough. From amiodarone induced lung toxicity.  6.  Mild nutritional anemia. Globin is 11-10. FOBT negative. The patient actually has bleeding. Baseline H&H is also 10-11. Check iron studies.  7.  Hypothyroidism. TSH was elevated likely from amiodarone. Patient is on low-dose Synthroid. Monitor.  Nutrition: Cardiac diet DVT Prophylaxis: on therapeutic anticoagulation.  Advance goals of care discussion: full code   Consults: none  Family Communication: family was present at bedside, at the time of interview.  Opportunity was given to ask question and all questions were answered satisfactorily.  Disposition: Admitted as observation, telemetry unit. Likely to be discharged home, in 1-2 days.  Author: Berle Mull, MD [facility] 10/23/2017  If 7PM-7AM, please contact night-coverage [email or web address] Password [email or web address]

## 2017-10-23 NOTE — ED Notes (Signed)
ED TO INPATIENT HANDOFF REPORT  Name/Age/Gender Ryan [name] 84 y.o. male  Code Status    Code Status Orders  (From admission, onward)        Start     Ordered   10/23/17 1754  Full code  Continuous     10/23/17 1758    Code Status History    Date Active Date Inactive Code Status Order ID Comments User Context   06/10/2016 1003 06/12/2016 1605 Full Code [id number]  Sherren Mocha, MD Inpatient   04/30/2016 1328 05/01/2016 1512 Full Code [id number]  Sherren Mocha, MD Inpatient   03/18/2016 1506 03/22/2016 1615 Full Code [id number]  Crista Luria Inpatient   03/18/2016 1205 03/18/2016 1506 Full Code [id number]  Belva Crome, MD ED   01/17/2016 1003 01/17/2016 1649 Full Code [id number]  Belva Crome, MD Inpatient      Home/SNF/Other Home  Chief Complaint syncope  Level of Care/Admitting Diagnosis ED Disposition    ED Disposition Condition [facility]: [facility] [[id number]]  Level of Care: Telemetry [5]  Admit to tele based on following criteria: Eval of Syncope  Diagnosis: Syncope [572620]  Admitting Physician: Lavina Hamman [[id number]]  Attending Physician: Lavina Hamman [[id number]]  PT Class (Do Not Modify): Observation [104]  PT Acc Code (Do Not Modify): Observation [10022]       Medical History Past Medical History:  Diagnosis Date  . Age-related macular degeneration   . Allergic rhinitis   . Aortic valve stenosis   . Arthritis    "hands, lower back" (04/30/2016)  . Asthma   . Carotid artery obstruction   . Chronic lower back pain   . Coronary arteriosclerosis in native artery   . Diverticulitis of colon   . DJD (degenerative joint disease)   . Dyspnea    "constant but the degree changes"  . Dysthymia   . GERD (gastroesophageal reflux disease)   . History of elevated PSA 05/2009  . History of hiatal hernia   . HLD (hyperlipidemia)   . Hypertension   . Nocturnal hypoxemia   . OSA (obstructive  sleep apnea)    intolerant to CPAP  . Paroxysmal supraventricular tachycardia ([place])   . Pneumonia    "when I was a kid"  . Presence of permanent cardiac pacemaker   . Primary malignant neoplasm of bladder ([place])   . Prostate cancer (HCC)     Allergies Allergies  Allergen Reactions  . Ciprofloxacin Anaphylaxis  . Hydrochlorothiazide Anaphylaxis  . Sulfa Antibiotics Anaphylaxis  . Ace Inhibitors Cough  . Losartan Cough  . Carvedilol Cough    Pt states it "causes him too cough."  . Lisinopril Cough    Pt reports worsening cough and "feeling wheezy."    IV Location/Drains/Wounds Patient Lines/Drains/Airways Status   Active Line/Drains/Airways    Name:   Placement date:   Placement time:   Site:   Days:   Peripheral IV 10/23/17 Left Forearm   10/23/17    1447    Forearm   less than 1   Incision (Closed) 06/10/16 Groin Right   06/10/16    0914     500   Incision (Closed) 06/10/16 Groin Left   06/10/16    0914     500          Labs/Imaging Results for orders placed or performed during the hospital encounter of 10/23/17 (from the past 48 hour(s))  CBG monitoring, ED  Status: Abnormal   Collection Time: 10/23/17  1:55 PM  Result Value Ref Range   Glucose-Capillary 113 (H) 65 - 99 mg/dL   Comment 1 Notify RN    Comment 2 Document in Chart   POC occult blood, ED     Status: None   Collection Time: 10/23/17  2:45 PM  Result Value Ref Range   Fecal Occult Bld NEGATIVE NEGATIVE  CBC WITH DIFFERENTIAL     Status: Abnormal   Collection Time: 10/23/17  2:50 PM  Result Value Ref Range   WBC 8.7 4.0 - 10.5 K/uL   RBC 4.45 4.22 - 5.81 MIL/uL   Hemoglobin 11.4 (L) 13.0 - 17.0 g/dL   HCT 35.6 (L) 39.0 - 52.0 %   MCV 80.0 78.0 - 100.0 fL   MCH 25.6 (L) 26.0 - 34.0 pg   MCHC 32.0 30.0 - 36.0 g/dL   RDW 17.3 (H) 11.5 - 15.5 %   Platelets 214 150 - 400 K/uL   Neutrophils Relative % 79 %   Neutro Abs 6.9 1.7 - 7.7 K/uL   Lymphocytes Relative 11 %   Lymphs Abs 0.9 0.7 - 4.0 K/uL    Monocytes Relative 8 %   Monocytes Absolute 0.7 0.1 - 1.0 K/uL   Eosinophils Relative 1 %   Eosinophils Absolute 0.1 0.0 - 0.7 K/uL   Basophils Relative 1 %   Basophils Absolute 0.0 0.0 - 0.1 K/uL    Comment: Performed at [facility], [place] [address], [place], [place] [address]  Comprehensive metabolic panel     Status: Abnormal   Collection Time: 10/23/17  2:50 PM  Result Value Ref Range   Sodium 138 135 - 145 mmol/L   Potassium 3.7 3.5 - 5.1 mmol/L   Chloride 105 101 - 111 mmol/L   CO2 24 22 - 32 mmol/L   Glucose, Bld 104 (H) 65 - 99 mg/dL   BUN 21 (H) 6 - 20 mg/dL   Creatinine, Ser 1.01 0.61 - 1.24 mg/dL   Calcium 8.6 (L) 8.9 - 10.3 mg/dL   Total Protein 6.3 (L) 6.5 - 8.1 g/dL   Albumin 3.1 (L) 3.5 - 5.0 g/dL   AST 58 (H) 15 - 41 U/L   ALT 81 (H) 17 - 63 U/L   Alkaline Phosphatase 110 38 - 126 U/L   Total Bilirubin 0.8 0.3 - 1.2 mg/dL   GFR calc non Af Amer >60 >60 mL/min   GFR calc Af Amer >60 >60 mL/min    Comment: (NOTE) The eGFR has been calculated using the CKD EPI equation. This calculation has not been validated in all clinical situations. eGFR's persistently <60 mL/min signify possible Chronic Kidney Disease.    Anion gap 9 5 - 15    Comment: Performed at [facility], [place] [address] [place], [place] [address]  Urinalysis, Routine w reflex microscopic     Status: None   Collection Time: 10/23/17  2:50 PM  Result Value Ref Range   Color, Urine YELLOW YELLOW   APPearance CLEAR CLEAR   Specific Gravity, Urine 1.008 1.005 - 1.030   pH 6.0 5.0 - 8.0   Glucose, UA NEGATIVE NEGATIVE mg/dL   Hgb urine dipstick NEGATIVE NEGATIVE   Bilirubin Urine NEGATIVE NEGATIVE   Ketones, ur NEGATIVE NEGATIVE mg/dL   Protein, ur NEGATIVE NEGATIVE mg/dL   Nitrite NEGATIVE NEGATIVE   Leukocytes, UA NEGATIVE NEGATIVE    Comment: Performed at [facility], [address]  [place], [place] [address]  D-dimer, quantitative  (not at [facility])     Status: Abnormal   Collection Time: 10/23/17  2:51 PM  Result Value Ref Range   D-Dimer, Quant 0.57 (H) 0.00 - 0.50 ug/mL-FEU    Comment: (NOTE) At the manufacturer cut-off of 0.50 ug/mL FEU, this assay has been documented to exclude PE with a sensitivity and negative predictive value of 97 to 99%.  At this time, this assay has not been approved by the FDA to exclude DVT/VTE. Results should be correlated with clinical presentation. Performed at [facility], [place] [address], [place], [place] [address]    Dg Chest 2 View  Result Date: 10/23/2017 CLINICAL DATA:  Syncope. EXAM: CHEST - 2 VIEW COMPARISON:  Radiographs of Oct 14, 2017. FINDINGS: Stable cardiomediastinal silhouette. Aortic valve prosthesis is noted. Left-sided pacemaker is unchanged in position. No pneumothorax is noted. No consolidative process is noted. Small bilateral pleural effusions are noted posteriorly. The visualized skeletal structures are unremarkable. IMPRESSION: Small bilateral pleural effusions. No other significant abnormality seen in the chest. Electronically Signed   By: Marijo Conception, M.D.   On: 10/23/2017 15:25   Ct Head Wo Contrast  Result Date: 10/23/2017 CLINICAL DATA:  85 y/o  M; syncope. EXAM: CT HEAD WITHOUT CONTRAST TECHNIQUE: Contiguous axial images were obtained from the base of the skull through the vertex without intravenous contrast. COMPARISON:  None. FINDINGS: Brain: No evidence of acute infarction, hemorrhage, hydrocephalus, extra-axial collection or mass lesion/mass effect. Mild chronic microvascular ischemic changes and parenchymal volume loss of the brain for age. Small chronic lacunar infarctions within left caudate body and thalamus. Vascular: Calcific atherosclerosis of carotid siphons. No hyperdense vessel identified. Skull: Normal. Negative for fracture or focal lesion. Sinuses/Orbits: No acute finding. Other: 16 mm lipoma within the left suboccipital  myocutaneous junction. IMPRESSION: 1. No acute intracranial abnormality identified. 2. Mild for age chronic microvascular ischemic changes and parenchymal volume loss of the brain. Electronically Signed   By: Kristine Garbe M.D.   On: 10/23/2017 14:30    Pending Labs Unresulted Labs (From admission, onward)   Start     Ordered   10/23/17 1800  Iron and TIBC  Once,   R     10/23/17 1759   10/23/17 1800  Ferritin  Once,   R     10/23/17 1759   10/23/17 1800  Vitamin B12  Once,   R     10/23/17 1759   10/23/17 1759  Troponin I  Now then every 6 hours,   R     10/23/17 1758   10/23/17 1759  TSH  Once,   R     10/23/17 1758   10/23/17 1755  Brain natriuretic peptide  Once,   R     10/23/17 1758      Vitals/Pain Today's Vitals   10/23/17 1630 10/23/17 1700 10/23/17 1800 10/23/17 1802  BP: 131/61 (!) 151/70 127/87 127/87  Pulse: 65 69 67 72  Resp:   18 18  Temp:      TempSrc:      SpO2: 96% 92% 99% 99%  Weight:      Height:      PainSc:        Isolation Precautions No active isolations  Medications Medications  acetaminophen-codeine (TYLENOL #3) 300-30 MG per tablet 1 tablet (has no administration in time range)  albuterol (PROVENTIL) (2.5 MG/3ML) 0.083% nebulizer solution 2.5 mg (has no administration in time range)  apixaban (ELIQUIS)  tablet 2.5 mg (has no administration in time range)  atorvastatin (LIPITOR) tablet 20 mg (has no administration in time range)  benzonatate (TESSALON) capsule 100 mg (has no administration in time range)  budesonide (PULMICORT) nebulizer solution 0.5 mg (has no administration in time range)  mometasone-formoterol (DULERA) 100-5 MCG/ACT inhaler 2 puff (has no administration in time range)  ipratropium-albuterol (DUONEB) 0.5-2.5 (3) MG/3ML nebulizer solution 3 mL (has no administration in time range)  latanoprost (XALATAN) 0.005 % ophthalmic solution 1 drop (has no administration in time range)  levothyroxine (SYNTHROID, LEVOTHROID)  tablet 12.5 mcg (has no administration in time range)  metoprolol succinate (TOPROL-XL) 24 hr tablet 25 mg (has no administration in time range)  sodium chloride flush (NS) 0.9 % injection 3 mL (has no administration in time range)  acetaminophen (TYLENOL) tablet 650 mg (has no administration in time range)    Or  acetaminophen (TYLENOL) suppository 650 mg (has no administration in time range)  senna-docusate (Senokot-S) tablet 1 tablet (has no administration in time range)  ondansetron (ZOFRAN) tablet 4 mg (has no administration in time range)    Or  ondansetron (ZOFRAN) injection 4 mg (has no administration in time range)    Mobility walks with device

## 2017-10-23 NOTE — ED Notes (Signed)
Pt taken off 2L as he does not wear O2 at home. O2 sat remains between 95%-97%

## 2017-10-23 NOTE — ED Triage Notes (Signed)
Pt is presented by EMS for evaluation post reported syncope episode. He currently denies CP, SOB, dizziness or HA.

## 2017-10-23 NOTE — ED Notes (Signed)
Pt placed back on 2L of oxygen. 

## 2017-10-23 NOTE — ED Notes (Signed)
Patient transported to X-ray 

## 2017-10-23 NOTE — ED Notes (Addendum)
Pt ambulated 20 feet with walker and SpO2 dropped to 88%. Pt asked to walk an additional 20 feet and SpO2 went as low as 78% but then returned to baseline. Pt stated they "felt fine during the walk"

## 2017-10-24 ENCOUNTER — Observation Stay (HOSPITAL_COMMUNITY): Payer: Medicare Other

## 2017-10-24 DIAGNOSIS — R0609 Other forms of dyspnea: Secondary | ICD-10-CM | POA: Diagnosis not present

## 2017-10-24 DIAGNOSIS — R55 Syncope and collapse: Secondary | ICD-10-CM | POA: Diagnosis not present

## 2017-10-24 LAB — GLUCOSE, CAPILLARY: GLUCOSE-CAPILLARY: 85 mg/dL (ref 65–99)

## 2017-10-24 LAB — TROPONIN I: Troponin I: <0.03 ng/mL (ref <0.03)

## 2017-10-24 LAB — ECHOCARDIOGRAM COMPLETE
Height: 66 in
WEIGHTICAEL: 2272 [oz_av]

## 2017-10-24 MED ORDER — ACETAMINOPHEN-CODEINE #3 300-30 MG PO TABS
1.0000 | ORAL_TABLET | Freq: Three times a day (TID) | ORAL | 0 refills | Status: AC | PRN
Start: 2017-10-24 — End: 2017-10-27

## 2017-10-24 MED ORDER — SODIUM CHLORIDE 0.9 % IV SOLN
510.0000 mg | Freq: Once | INTRAVENOUS | Status: AC
  Administered 2017-10-24: 510 mg via INTRAVENOUS
  Filled 2017-10-24: qty 17

## 2017-10-24 MED ORDER — BENZONATATE 100 MG PO CAPS: 100.0000 mg | ORAL_CAPSULE | Freq: Three times a day (TID) | ORAL | 0 refills | Status: AC

## 2017-10-24 MED ORDER — MENTHOL 3 MG MT LOZG
1.0000 | LOZENGE | OROMUCOSAL | Status: DC | PRN
  Filled 2017-10-24: qty 9

## 2017-10-24 MED ORDER — MENTHOL 3 MG MT LOZG: 1.0000 | LOZENGE | OROMUCOSAL | 12 refills | Status: DC | PRN

## 2017-10-24 NOTE — Care Management Note (Addendum)
Case Management Note  Patient Details  Name: Ryan [name] MRN: [id number] Date of Birth: 06/18/1929  Subjective/Objective:    syncope                Action/Plan: NCM spoke to spouse and offered choice for HH/list provided. Wife agreeable to [facility] for [facility]. Will need oxygen and 3n1 bedside commode. Has Rollator at home. Contacted [facility] with new referral.   10/24/2017 4:25 pm Pt completed 6 minute walk test and oxygen dropped to 89% x2 and able to quickly recover during walk to 92-96%. Did not complain of shortness of breath. Pt complained of feeling a little fatigued. Notified attending. Contacted [facility] for 3n1 bedside commode for home.    Expected Discharge Date:                  Expected Discharge Plan:  [place]  In-House Referral:  NA  Discharge planning Services  CM Consult  Post Acute Care Choice:  Home Health Choice offered to:  Spouse  DME Arranged:  3-N-1, Oxygen DME Agency:  [place]:  PT [place] Agency:  [place]  Status of Service:  Completed, signed off  If discussed at [place] of Stay Meetings, dates discussed:    Additional Comments:  Erenest Rasher, RN 10/24/2017, 3:54 PM  [phone]

## 2017-10-24 NOTE — Progress Notes (Signed)
SATURATION QUALIFICATIONS: (This note is used to comply with regulatory documentation for home oxygen)  Patient Saturations on Room Air at Rest = 88%  Placed on 2L [place] and sat return to 92

## 2017-10-24 NOTE — Progress Notes (Signed)
  Echocardiogram 2D Echocardiogram has been performed.  Ryan [name] 10/24/2017, 11:16 AM

## 2017-10-24 NOTE — Progress Notes (Signed)
SATURATION QUALIFICATIONS: (This note is used to comply with regulatory documentation for home oxygen)  Patient Saturations on Room Air at Rest = 94-96%  Patient Saturations on Room Air while Ambulating = 89%     Please briefly explain why patient needs home oxygen:  Pt completed 6 minute walk test and oxygen dropped to 89% x2 and able to quickly recover during walk to 92-96%. Did not complain of shortness of breath. Pt complained of feeling a little fatigued. MD notified.

## 2017-10-24 NOTE — Evaluation (Signed)
Physical Therapy Evaluation Patient Details Name: Ryan [name] MRN: [id number] DOB: [birth year] Today's Date: 10/24/2017   History of Present Illness  84yo male who passed out in the car, woke but dizzy and nauseous, pale, easily fatigued. O2 measured at home was 86%, and he was taken to the ED. O2 dropped to 78% on room air in the ED. Diagnosed with syncope secondary to hypoxia. PMH macular degeneration, aortic valve stenosis, LBP, HTN, OSA, pacemaker, hx bladder CA, prostate CA, cardiac cath, s/p TAVR   Clinical Impression   Patient received in bed, pleasant and willing to work with skilled PT services. Took orthostatic vitals per MD request (see vitals flowsheets), then proceeded with functional transfers and ambulation approximately 166f today and limited by fatigue. Patient able to maintain between 89-93% on room air during gait, and had stabilized at 94-95% on room air while sitting up at end of session; RN aware of patient saturations and OK with him being on room air for now. Patient left up in chair with chair alarm set, all other needs met this afternoon. He will continue to benefit from skilled PT services in the acute setting as well as skilled HHPT services moving forward.    Follow Up Recommendations Home health PT    Equipment Recommendations  Rolling walker with 5" wheels;3in1 (PT)    Recommendations for Other Services       Precautions / Restrictions Precautions Precautions: Fall Precaution Comments: watch O2  Restrictions Weight Bearing Restrictions: No      Mobility  Bed Mobility Overal bed mobility: Modified Independent                Transfers Overall transfer level: Needs assistance Equipment used: Rolling walker (2 wheeled) Transfers: Sit to/from Stand Sit to Stand: Min guard         General transfer comment: min guard for safety and balance, Mod VC for hand placement and safety   Ambulation/Gait Ambulation/Gait assistance: Min  guard Ambulation Distance (Feet): 100 Feet Assistive device: Rolling walker (2 wheeled) Gait Pattern/deviations: Step-through pattern;Decreased step length - right;Decreased step length - left;Decreased stride length;Trunk flexed     General Gait Details: gait distance limited by fatigue   Stairs            Wheelchair Mobility    Modified Rankin (Stroke Patients Only)       Balance Overall balance assessment: Mild deficits observed, not formally tested                                           Pertinent Vitals/Pain Pain Assessment: No/denies pain    Home Living Family/patient expects to be discharged to:: Private residence Living Arrangements: Spouse/significant other Available Help at Discharge: Family;Available 24 hours/day Type of Home: House Home Access: Stairs to enter Entrance Stairs-Rails: Right Entrance Stairs-Number of Steps: 3 Home Layout: One level Home Equipment: Walker - 2 wheels      Prior Function Level of Independence: Independent with assistive device(s)               Hand Dominance        Extremity/Trunk Assessment   Upper Extremity Assessment Upper Extremity Assessment: Defer to OT evaluation    Lower Extremity Assessment Lower Extremity Assessment: Generalized weakness    Cervical / Trunk Assessment Cervical / Trunk Assessment: Normal  Communication   Communication: No difficulties  Cognition Arousal/Alertness: Awake/alert  Behavior During Therapy: WFL for tasks assessed/performed Overall Cognitive Status: Within Functional Limits for tasks assessed                                        General Comments General comments (skin integrity, edema, etc.): SpO2 on room air 95%, able to maintain between 89-93% during gait, stabilized at 94-95% at rest in chair on room air at EOS; VSS otherwise     Exercises     Assessment/Plan    PT Assessment Patient needs continued PT services  PT  Problem List Decreased strength;Decreased mobility;Decreased safety awareness;Decreased coordination;Decreased activity tolerance;Decreased balance       PT Treatment Interventions DME instruction;Therapeutic activities;Gait training;Therapeutic exercise;Patient/family education;Stair training;Balance training;Functional mobility training;Neuromuscular re-education;Manual techniques    PT Goals (Current goals can be found in the Care Plan section)  Acute Rehab PT Goals Patient Stated Goal: to go home  PT Goal Formulation: With patient/family Time For Goal Achievement: 11/07/17 Potential to Achieve Goals: Good    Frequency Min 3X/week   Barriers to discharge        Co-evaluation               AM-PAC PT "6 Clicks" Daily Activity  Outcome Measure Difficulty turning over in bed (including adjusting bedclothes, sheets and blankets)?: None Difficulty moving from lying on back to sitting on the side of the bed? : A Little Difficulty sitting down on and standing up from a chair with arms (e.g., wheelchair, bedside commode, etc,.)?: A Little Help needed moving to and from a bed to chair (including a wheelchair)?: A Little Help needed walking in hospital room?: A Little Help needed climbing 3-5 steps with a railing? : A Lot 6 Click Score: 18    End of Session Equipment Utilized During Treatment: Gait belt Activity Tolerance: Patient tolerated treatment well Patient left: in chair;with call bell/phone within reach;with chair alarm set Nurse Communication: Mobility status;Other (comment)(SpO2 on room air at rest and durnig activity, patient left off of room air at EOS ) PT Visit Diagnosis: Unsteadiness on feet (R26.81);Muscle weakness (generalized) (M62.81)    Time: 5597-4163 PT Time Calculation (min) (ACUTE ONLY): 41 min   Charges:   PT Evaluation $PT Eval Low Complexity: 1 Low PT Treatments $Gait Training: 8-22 mins $Therapeutic Activity: 8-22 mins   PT G Codes:         Deniece Ree PT, DPT, CBIS  Supplemental Physical Therapist [place]   Pager [phone]

## 2017-10-24 NOTE — Progress Notes (Signed)
Notified [facility] to discuss with pt and out of pocket cost. Jonnie Finner RN CCM Case Mgmt phone [phone]

## 2017-10-24 NOTE — Progress Notes (Addendum)
Pt for d/c home. VSS. No change in initial am assessment at this time. Explained that o2 would be delivered to the house tonight. Condition stable.

## 2017-10-25 NOTE — Discharge Summary (Signed)
[facility] Discharge Summary   Patient: Ryan [name] EXB:[id number]   PCP: Dorothyann Peng, NP DOB: [birth year]   Date of admission: 10/23/2017   Date of discharge: 10/24/2017     Discharge Diagnoses:  Principal Problem:   Syncope Active Problems:   Severe aortic stenosis   Fatigue   DOE (dyspnea on exertion)   PAF (paroxysmal atrial fibrillation) (HCC)   Chronic combined systolic (congestive) and diastolic (congestive) heart failure (HCC)   Pure hypercholesterolemia   CAD (coronary artery disease), native coronary artery   Elevated TSH   Decreased diffusion capacity   Amiodarone pulmonary toxicity   Admitted From: Home Disposition: Home with home health  Recommendations for Outpatient Follow-up:  1. Please follow-up with PCP in 1 week also follow-up with cardiology as recommended  Follow-up Information    [facility] Follow up.   Specialty:  [place] Why:  Home Health Physical Therapy- agency will call to arrange initial visit Contact information: [address] [place] [address] [phone]        Dorothyann Peng, NP. Schedule an appointment as soon as possible for a visit in 1 week(s).   Specialty:  Family Medicine Contact information: [place] [address] [phone]        Dorothy Spark, MD .   Specialty:  Cardiology Contact information: [address] [place] [address] [phone]          Diet recommendation: Cardiac diet  Activity: The patient is advised to gradually reintroduce usual activities.  Discharge Condition: good  Code Status: Full code  History of present illness: As per the H and P dictated on admission, "Ryan [name] is a 84 y.o. male with Past medical history of aortic stenosis S/P TAVR, A. fib, CAD S/P PCI, chronic combined CHF, PVD, pacemaker implant, OSA, HTN, prostate cancer, amiodarone induced lung toxicity. Patient  presented with an episode of passing.  Patient was at his baseline when he woke up this morning.  While he was trying to go out with his wife and try to sit in the car he felt that his head was spinning and he felt nauseous.  Following that he passed out in the car.  Patient was brought to the house and reportedly he was confused, pale looking, and severely fatigued with complaints of headache.  1 of the family member who is Dr., Dr. Loletha Carrow had a pulse oximetry and check his oxygen which was 86% on room air.  Patient came around and he was brought to the hospital. At the time of my evaluation patient continues to complain of frontal headache which is throbbing, no dizziness no lightheadedness no chest pain no abdominal pain no nausea no vomiting no diarrhea no burning urination. Patient has chronic swelling of his legs for which she was given Lasix for 2 days a few days ago.  Since yesterday patient has switched his Lasix to as needed as well. Patient is also using stockings for swelling of the leg. Patient is on amiodarone for A. fib.  For last 6 weeks he has been having progressive shortness of breath as well as cough on exertion.  Has been a minute by pulmonary, PFT performed shows very low DLCO and patient was diagnosed with amiodarone induced lung toxicity.  Amiodarone was discontinued no other change in the medications reported."  Hospital Course:  Summary of his active problems in the hospital is as following. 1. Syncope  Etiology unclear  at present but most likely acute hypoxia developing progressively over last few weeks is the cause. CT head unremarkable, neurological examination unremarkable.  Do not suspect that patient has neurologic cause of syncope. Chest x-ray clear for pneumonia, d-dimer minimally elevated-do not suspect PE, EKG shows sinus rhythm. Patient has a pacemaker. QT is prolonged and patient was on amiodarone until yesterday. Electrolytes are stable. Patient was monitored on  telemetry, no significant events noted overnight Patient with interrogation was also unremarkable. Echocardiogram showed EF 35 to 40%. Prior echocardiogram in December 2018 was showing EF of 40 to 45%. Discussed with cardiology, patient has allergies listed to lisinopril and losartan and is currently going amiodarone toxicity induced cough for last few weeks, also lack of room for blood pressure to add any other new medication. Given this contraindications patient will follow-up with cardiology as an outpatient no indication for any further work-up or cardiology in the hospital. Orthostatic vitals were unremarkable morning.  2.  Severe aortic stenosis S/P TAVR. Chronic combined systolic and diastolic CHF. Paroxysmal A. fib. DOE. Fatigue. CAD. PAD. Only on Eliquis for now. Which I will continue. Patient was in Lasix for 2 days and it has been switched to PRN for now.  3.  Headache. ?  Sinus.  Versus hypoxia causing headache. Tylenol as needed.  4.  Acute hypoxic respiratory failure. Appears to actually how progressively worsening chronic hypoxic respiratory failure. From amiodarone lung toxicity. Negative serial troponin, minimally elevated BNP. D-dimer is not elevated. Arrange oxygen on discharge for exertion.  5.  Chronic cough. Change Tessalon from PRN to schedule. Continue Tylenol 3 for cough. From amiodarone induced lung toxicity.  6.  Mild nutritional anemia. Globin is 11-10. FOBT negative. The patient actually has bleeding. Baseline H&H is also 10-11. Iron study suggest severely low iron.  Patient has history of severe constipation and therefore family would like to avoid oral iron. Patient received IV iron x1. Recommend further work-up as an outpatient.  7.  Hypothyroidism. TSH was elevated likely from amiodarone. Patient is on low-dose Synthroid. Monitor.  All other chronic medical condition were stable during the hospitalization.  Patient was seen by  physical therapy, who recommended home health, which was arranged by Education officer, museum and case Freight forwarder. On the day of the discharge the patient's vitals were stable, and no other acute medical condition were reported by patient. the patient was felt safe to be discharge at home with home health.  Consultants: none, discussed with cardiology on phone Procedures: Echocardiogram   DISCHARGE MEDICATION: Allergies as of 10/24/2017      Reactions   Ciprofloxacin Anaphylaxis   Hydrochlorothiazide Anaphylaxis   Sulfa Antibiotics Anaphylaxis   Ace Inhibitors Cough   Losartan Cough   Carvedilol Cough   Pt states it "causes him too cough."   Lisinopril Cough   Pt reports worsening cough and "feeling wheezy."      Medication List    TAKE these medications   acetaminophen 500 MG tablet Commonly known as:  TYLENOL Take 500 mg by mouth as needed for mild pain. Notes to patient:  DO NOT TAKE more than 2 a day. While taking the Tylenol #3   acetaminophen-codeine 300-30 MG tablet Commonly known as:  TYLENOL #3 Take 1 tablet by mouth every 8 (eight) hours as needed for up to 3 days (cough). What changed:  when to take this   albuterol (2.5 MG/3ML) 0.083% nebulizer solution Commonly known as:  PROVENTIL Take 3 mLs (2.5 mg total) by nebulization every 8 (  eight) hours as needed for wheezing or shortness of breath.   amLODipine 5 MG tablet Commonly known as:  NORVASC Take 1 tablet (5 mg total) by mouth daily.   apixaban 2.5 MG Tabs tablet Commonly known as:  ELIQUIS Take 1 tablet (2.5 mg total) by mouth 2 (two) times daily.   atorvastatin 20 MG tablet Commonly known as:  LIPITOR Take 1 tablet (20 mg total) by mouth daily.   benzonatate 100 MG capsule Commonly known as:  TESSALON Take 1 capsule (100 mg total) by mouth 3 (three) times daily for 5 days. What changed:    medication strength  how much to take  when to take this  reasons to take this   budesonide 0.5 MG/2ML nebulizer  solution Commonly known as:  PULMICORT Take 2 mLs (0.5 mg total) by nebulization every 8 (eight) hours as needed.   budesonide-formoterol 80-4.5 MCG/ACT inhaler Commonly known as:  SYMBICORT Inhale 2 puffs into the lungs 2 (two) times daily.   cholecalciferol 1000 units tablet Commonly known as:  VITAMIN D Take 1,000 Units by mouth 2 (two) times a week. Notes to patient:  Only take twice a week as before admission   COQ-10 PO Take 1 capsule by mouth 2 (two) times a week. Notes to patient:  Only take twice a week as before admission   furosemide 20 MG tablet Commonly known as:  LASIX Take 1 tablet (20 mg total) by mouth daily as needed for edema. Notes to patient:  As needed for swelling in the legs   ipratropium-albuterol 0.5-2.5 (3) MG/3ML Soln Commonly known as:  DUONEB Take 3 mLs by nebulization every 6 (six) hours as needed. Dx: R06.09   latanoprost 0.005 % ophthalmic solution Commonly known as:  XALATAN Place 1 drop into both eyes at bedtime.   levothyroxine 25 MCG tablet Commonly known as:  SYNTHROID, LEVOTHROID Take 0.5 tablets (12.5 mcg total) by mouth daily before breakfast.   menthol-cetylpyridinium 3 MG lozenge Commonly known as:  CEPACOL Take 1 lozenge (3 mg total) by mouth as needed for sore throat. Notes to patient:  New medication   metoprolol succinate 25 MG 24 hr tablet Commonly known as:  TOPROL XL Take 1 tablet (25 mg total) by mouth daily.   nitroGLYCERIN 0.4 MG SL tablet Commonly known as:  NITROSTAT Place 1 tablet (0.4 mg total) under the tongue every 5 (five) minutes as needed.   VITAMIN B 12 PO Take 1 capsule by mouth daily.      Allergies  Allergen Reactions  . Ciprofloxacin Anaphylaxis  . Hydrochlorothiazide Anaphylaxis  . Sulfa Antibiotics Anaphylaxis  . Ace Inhibitors Cough  . Losartan Cough  . Carvedilol Cough    Pt states it "causes him too cough."  . Lisinopril Cough    Pt reports worsening cough and "feeling wheezy."    Discharge Instructions    Diet - low sodium heart healthy   Complete by:  As directed    Diet - low sodium heart healthy   Complete by:  As directed    Discharge instructions   Complete by:  As directed    It is important that you read following instructions as well as go over your medication list with RN to help you understand your care after this hospitalization.  Discharge Instructions: Please follow-up with PCP in one week  Please request your primary care physician to go over all Hospital Tests and Procedure/Radiological results at the follow up,  Please get all Hospital records  sent to your PCP by signing hospital release before you go home.   Do not take more than prescribed Pain, Sleep and Anxiety Medications. You were cared for by a hospitalist during your hospital stay. If you have any questions about your discharge medications or the care you received while you were in the hospital after you are discharged, you can call the unit and ask to speak with the hospitalist on call if the hospitalist that took care of you is not available.  Once you are discharged, your primary care physician will handle any further medical issues. Please note that NO REFILLS for any discharge medications will be authorized once you are discharged, as it is imperative that you return to your primary care physician (or establish a relationship with a primary care physician if you do not have one) for your aftercare needs so that they can reassess your need for medications and monitor your lab values. You Must read complete instructions/literature along with all the possible adverse reactions/side effects for all the Medicines you take and that have been prescribed to you. Take any new Medicines after you have completely understood and accept all the possible adverse reactions/side effects. Wear Seat belts while driving. If you have smoked or chewed Tobacco in the last 2 yrs please stop smoking and/or stop  any Recreational drug use.   Increase activity slowly   Complete by:  As directed    Increase activity slowly   Complete by:  As directed      Discharge Exam: Filed Weights   10/23/17 1342 10/23/17 1930 10/24/17 0616  Weight: 65.1 kg (143 lb 9 oz) 66 kg (145 lb 9.6 oz) 64.4 kg (142 lb)   Vitals:   10/24/17 0742 10/24/17 1051  BP:  131/67  Pulse:  64  Resp:  18  Temp:  98.4 F (36.9 C)  SpO2: 90% 93%   General: Appear in no distress, no Rash; Oral Mucosa moist. Cardiovascular: S1 and S2 Present, no Murmur, no JVD Respiratory: Bilateral Air entry present and bilateral  Crackles, no wheezes Abdomen: Bowel Sound present, Soft and no tenderness Extremities: no Pedal edema, no calf tenderness Neurology: Grossly no focal neuro deficit.  The results of significant diagnostics from this hospitalization (including imaging, microbiology, ancillary and laboratory) are listed below for reference.    Significant Diagnostic Studies: Dg Chest 2 View  Result Date: 10/23/2017 CLINICAL DATA:  Syncope. EXAM: CHEST - 2 VIEW COMPARISON:  Radiographs of Oct 14, 2017. FINDINGS: Stable cardiomediastinal silhouette. Aortic valve prosthesis is noted. Left-sided pacemaker is unchanged in position. No pneumothorax is noted. No consolidative process is noted. Small bilateral pleural effusions are noted posteriorly. The visualized skeletal structures are unremarkable. IMPRESSION: Small bilateral pleural effusions. No other significant abnormality seen in the chest. Electronically Signed   By: Marijo Conception, M.D.   On: 10/23/2017 15:25   Dg Chest 2 View  Result Date: 10/14/2017 CLINICAL DATA:  Cough and wheezing due to bronchitis. History of valve replacement and pacemaker placement. Nonsmoker. EXAM: CHEST - 2 VIEW COMPARISON:  PA and lateral chest x-ray of September 06, 2017 FINDINGS: The lungs are well-expanded. There is no focal infiltrate. There is no pleural effusion. The heart and pulmonary vascularity are  normal. And prosthetic aortic valve cage is present. The ICD is in stable position. There is calcification in the wall of the aortic arch. The bony thorax exhibits no acute abnormality. IMPRESSION: There is no active cardiopulmonary disease. Electronically Signed   By: Shanon Brow  [place] M.D.   On: 10/14/2017 15:01   Dg Lumbar Spine Complete  Result Date: 10/14/2017 CLINICAL DATA:  Worsening low back pain for the past 2 weeks with no known injury. EXAM: LUMBAR SPINE - COMPLETE 4+ VIEW COMPARISON:  Abdominal CT scan of March 19, 2016 FINDINGS: The colonic stool burden is increased diffusely. There is mild levocurvature centered at L4-5. There is grade 2 anterolisthesis of L5 with respect to S1 measuring approximately 1.7 cm. There is degenerative disc space narrowing at L1-2 and L2-3 as well as at L5-S1. There are bilateral pars defects at L5. The pedicles appear intact. The observed portions of the sacrum are normal. IMPRESSION: Grade 2 anterolisthesis of L5 with respect S1 secondary to degenerative disc disease and bilateral pars defects. Moderate to severe disc space narrowing at L1-2 and at L2-3 consistent with degenerative change. Electronically Signed   By: David  [place] M.D.   On: 10/14/2017 14:59   Ct Head Wo Contrast  Result Date: 10/23/2017 CLINICAL DATA:  84 y/o  M; syncope. EXAM: CT HEAD WITHOUT CONTRAST TECHNIQUE: Contiguous axial images were obtained from the base of the skull through the vertex without intravenous contrast. COMPARISON:  None. FINDINGS: Brain: No evidence of acute infarction, hemorrhage, hydrocephalus, extra-axial collection or mass lesion/mass effect. Mild chronic microvascular ischemic changes and parenchymal volume loss of the brain for age. Small chronic lacunar infarctions within left caudate body and thalamus. Vascular: Calcific atherosclerosis of carotid siphons. No hyperdense vessel identified. Skull: Normal. Negative for fracture or focal lesion. Sinuses/Orbits: No acute  finding. Other: 16 mm lipoma within the left suboccipital myocutaneous junction. IMPRESSION: 1. No acute intracranial abnormality identified. 2. Mild for age chronic microvascular ischemic changes and parenchymal volume loss of the brain. Electronically Signed   By: Kristine Garbe M.D.   On: 10/23/2017 14:30    Microbiology: No results found for this or any previous visit (from the past 240 hour(s)).   Labs: CBC: Recent Labs  Lab 10/20/17 1058 10/23/17 1450  WBC 7.4 8.7  NEUTROABS  --  6.9  HGB 10.9* 11.4*  HCT 33.6* 35.6*  MCV 80.2 80.0  PLT 193.0 820   Basic Metabolic Panel: Recent Labs  Lab 10/20/17 1058 10/23/17 1450  NA 136 138  K 3.7 3.7  CL 104 105  CO2 24 24  GLUCOSE 102* 104*  BUN 20 21*  CREATININE 1.13 1.01  CALCIUM 8.3* 8.6*   Liver Function Tests: Recent Labs  Lab 10/23/17 1450  AST 58*  ALT 81*  ALKPHOS 110  BILITOT 0.8  PROT 6.3*  ALBUMIN 3.1*   No results for input(s): LIPASE, AMYLASE in the last 168 hours. No results for input(s): AMMONIA in the last 168 hours. Cardiac Enzymes: Recent Labs  Lab 10/23/17 1938 10/23/17 2352 10/24/17 0558  TROPONINI <0.03 <0.03 <0.03   BNP (last 3 results) Recent Labs    10/23/17 1938  BNP 146.7*   CBG: Recent Labs  Lab 10/23/17 1355 10/24/17 0624  GLUCAP 113* 85   Time spent: 35 minutes  Signed:  Berle Mull  [facility] 10/24/2017 , 8:04 AM

## 2017-10-26 DIAGNOSIS — R55 Syncope and collapse: Secondary | ICD-10-CM | POA: Diagnosis not present

## 2017-10-26 DIAGNOSIS — I5032 Chronic diastolic (congestive) heart failure: Secondary | ICD-10-CM | POA: Diagnosis not present

## 2017-10-26 DIAGNOSIS — J984 Other disorders of lung: Secondary | ICD-10-CM | POA: Diagnosis not present

## 2017-10-27 ENCOUNTER — Telehealth: Payer: Self-pay | Admitting: *Deleted

## 2017-10-27 NOTE — Telephone Encounter (Signed)
Spoke with the pt and wife and endorsed to them both that Dr Meda Coffee would like for him to come in to see her PA-C next week, for post-hospital follow-up for syncope and amiodarone toxicity.  Scheduled the pt to come in and see Melina Copa PA-C for next Thursday 11/05/17 at 1130.  Pt and wife aware to arrive 15 mins prior to this OV. Both verbalized understanding and agrees with this plan.

## 2017-10-27 NOTE — Telephone Encounter (Signed)
RE: Ryan [name]  Received: Today  Message Contents  Dorothy Spark, MD  Nuala Alpha, LPN        Karlene Einstein, can you have him come to see a PA in a week or two?   Previous Messages    ----- Message -----  From: Doran Stabler, MD  Sent: 10/27/2017  7:40 AM  To: Dorothy Spark, MD  Subject: Thompson Caul,   I hope your work weekend didn't take too much out of you.  I expect you are off after the call weekend, so nothing urgent.   [name] was admitted from Fri eve to Sat eve after syncope. Hypoxic to 78% with walking in ED. By Sat only got down to 89% with room air walking, but we still arranged home oxygen for now. Hope will improve over weeks while amiodarone goes away.   Repeat echo done, maybe little lower LVEF and little higher PA pressure since Dec. Minimal peripheral edema. Don't think there was pulm edema on ED CXR.   It does not seem he needs med changes at this point. When you feel ready to revisit Afib med, let us know.  He prob should not leave house for office appointments until at least late next week because so tough for him.   Take care,   Mallie Mussel

## 2017-10-27 NOTE — Telephone Encounter (Signed)
Sent a message to our [facility] pool and scheduler, to call the pt or wife to arrange post-ER appointment this week or early next week.  Will continue to follow.

## 2017-10-29 DIAGNOSIS — C61 Malignant neoplasm of prostate: Secondary | ICD-10-CM | POA: Diagnosis not present

## 2017-10-29 DIAGNOSIS — C671 Malignant neoplasm of dome of bladder: Secondary | ICD-10-CM | POA: Diagnosis not present

## 2017-11-03 ENCOUNTER — Ambulatory Visit: Payer: Medicare Other | Admitting: Pulmonary Disease

## 2017-11-03 ENCOUNTER — Other Ambulatory Visit (INDEPENDENT_AMBULATORY_CARE_PROVIDER_SITE_OTHER): Payer: Medicare Other

## 2017-11-03 ENCOUNTER — Encounter: Payer: Self-pay | Admitting: Pulmonary Disease

## 2017-11-03 VITALS — BP 94/58 | HR 81 | Ht 65.0 in | Wt 142.0 lb

## 2017-11-03 DIAGNOSIS — R0609 Other forms of dyspnea: Secondary | ICD-10-CM | POA: Diagnosis not present

## 2017-11-03 DIAGNOSIS — R0600 Dyspnea, unspecified: Secondary | ICD-10-CM

## 2017-11-03 DIAGNOSIS — J984 Other disorders of lung: Secondary | ICD-10-CM

## 2017-11-03 DIAGNOSIS — T462X5A Adverse effect of other antidysrhythmic drugs, initial encounter: Principal | ICD-10-CM

## 2017-11-03 LAB — SEDIMENTATION RATE: SED RATE: 90 mm/h — AB (ref 0–20)

## 2017-11-03 MED ORDER — BUDESONIDE 0.5 MG/2ML IN SUSP: 0.5000 mg | Freq: Three times a day (TID) | RESPIRATORY_TRACT | 0 refills | Status: DC | PRN

## 2017-11-03 MED ORDER — PREDNISONE 10 MG PO TABS: ORAL_TABLET | ORAL | 1 refills | Status: DC

## 2017-11-03 MED ORDER — IPRATROPIUM-ALBUTEROL 0.5-2.5 (3) MG/3ML IN SOLN: 3.0000 mL | Freq: Four times a day (QID) | RESPIRATORY_TRACT | 5 refills | Status: DC | PRN

## 2017-11-03 NOTE — Assessment & Plan Note (Signed)
Labs today  >>> Sed rate  Prednisone -for the amiodarone toxicity >>>40mg  daily for one week >>>30mg  for the following week  >>>20mg  for the following weeks after indefinitely until follow up appointment.  >>>take with food in the AM  >>> Remember to have close follow-up with our office, how you are feeling if this does help manage your symptoms then we can actually go up on this dose.  Continue follow up with PCP and Cardiology over the next 2 weeks  >>> Discuss headaches >>> Discuss daily prednisone started from our office for your amiodarone toxicity   Continue using your rescue inhaler as needed for shortness of breath  Walk today in office -maintained oxygen saturations greater than 92% with exertion off oxygen >>> Continue to follow-up with our office if you notice your oxygen saturations are dropping >>> Use your oxygen that you have at home as needed to maintain saturations  Need to check blood pressures at home >>> Notify primary care if trending down  Continue to check oxygen saturations at home, sporadically at rest as well as with exertion >>> Goal SPO2 sat should be greater than 90% >>> If you are needing oxygen therapy in order to maintain this please notify our office  Follow-up with our office in the next 3 to 4 weeks >>> With Dr. Lamonte Sakai or myself if he is unavailable

## 2017-11-03 NOTE — Assessment & Plan Note (Signed)
Prednisone -for the amiodarone toxicity >>>40mg  daily for one week >>>30mg  for the following week  >>>20mg  for the following weeks after indefinitely until follow up appointment.  >>>take with food in the AM  >>> Remember to have close follow-up with our office, how you are feeling if this does help manage your symptoms then we can actually go up on this dose.  Refilled DuoNeb's and Pulmicort at your request.  >>> These can be used as needed, does not need to be scheduled  Continue using your rescue inhaler as needed for shortness of breath  Walk today in office -maintained oxygen saturations greater than 92% with exertion off oxygen >>> Continue to follow-up with our office if you notice your oxygen saturations are dropping >>> Use your oxygen that you have at home as needed to maintain saturations  Continue to check oxygen saturations at home, sporadically at rest as well as with exertion >>> Goal SPO2 sat should be greater than 90% >>> If you are needing oxygen therapy in order to maintain this please notify our office  Limit daytime naps, long rest periods during the day.  To try to improve nighttime sleep.   Follow-up with our office in the next 3 to 4 weeks >>> With Dr. Lamonte Sakai or myself if he is unavailable

## 2017-11-03 NOTE — Patient Instructions (Addendum)
Labs today  >>> Sed rate  Prednisone -for the amiodarone toxicity >>>40mg  daily for one week >>>30mg  for the following week  >>>20mg  for the following weeks after indefinitely until follow up appointment.  >>>take with food in the AM  >>> Remember to have close follow-up with our office, how you are feeling if this does help manage your symptoms then we can actually go up on this dose.  Continue follow up with PCP and Cardiology over the next 2 weeks  >>> Discuss headaches >>> Discuss daily prednisone started from our office for your amiodarone toxicity   Refilled DuoNeb's and Pulmicort at your request.  >>> These can be used as needed, does not need to be scheduled  Continue using your rescue inhaler as needed for shortness of breath  Walk today in office -maintained oxygen saturations greater than 92% with exertion off oxygen >>> Continue to follow-up with our office if you notice your oxygen saturations are dropping >>> Use your oxygen that you have at home as needed to maintain saturations  Need to check blood pressures at home >>> Notify primary care if trending down  Continue to check oxygen saturations at home, sporadically at rest as well as with exertion >>> Goal SPO2 sat should be greater than 90% >>> If you are needing oxygen therapy in order to maintain this please notify our office  Limit daytime naps, long rest periods during the day.  To try to improve nighttime sleep.   Follow-up with our office in the next 3 to 4 weeks >>> With Dr. Lamonte Sakai or myself if he is unavailable    Please contact the office if your symptoms worsen or you have concerns that you are not improving.   Thank you for choosing [facility] for your healthcare, and for allowing [place] to partner with you on your healthcare journey. I am thankful to be able to provide care to you today.   Wyn Quaker FNP-C

## 2017-11-03 NOTE — Progress Notes (Signed)
@Patient  ID: Ryan [name], male    DOB: 1929-09-26, 84 y.o.   MRN: [id number]  Chief Complaint  Patient presents with  . Hospitalization Follow-up    When at home on 2l of o2 is at 94%. When off doing adls off  of his o2  his oxygen drops to 76 %.    Referring provider: Dorothyann Peng, NP  HPI: 84 year old never smoker with a significant cardiac history that includes hypertension, diastolic congestive heart failure, coronary artery disease, hyperlipidemia, atrial fibrillation, tachybradycardia syndrome with pacemaker in place (03/2016), severe aortic stenosis for which he underwent a TAVR January 2018.  Currently on anticoagulation. Amio recently stopped d/t suspected amiotoxicity Other past medical history: Untreated obstructive sleep apnea, bladder cancer, prostate cancer, asthma, chronic back pain.    Recent [place] [facility] Encounters:   08/03/2017 Office visit [facility] Reporting progressive fatigue has been present for over 2 years.  Able to do some exercise, but hears wheezing when he exercises difficult to move air through right side due to deviated septum.  He was started on albuterol which has helped somewhat the wheeze.  Pulmonary function test done on 07/07/2017-  High-res CT ordered to track lung nodules as well as to check for interstitial lung disease due to history of being on amiodarone.  03/2016 CT showed small pulmonary nodules.  No desats on walk test today.  VQ scan ordered. Potentially may need sleep study to check for obstructive sleep apnea and CPAP use.  Also may need to consider cardiopulmonary exercise if these other tests do not indicate any particular issues.  09/01/2017 Office visit-[facility] Reviewed high-res CT scan which shows very subtle basilar interstitial changes that are not necessarily consistent with UIP.  Some scattered bilateral stable small pulmonary nodules that are almost certainly benign.  VQ scan on 3/7 was low probability for PE.  He tells me the  albuterol has helped with his intermittent wheezing.  Consider cardio pulmonary rehab.  Will follow CT results with chest x-ray and may consider repeating CT depending on chest x-ray and his clinical status after 1 year (09/2018).  10/20/17  Acute  Patient presents office today with wife.  Patient has had extensive medical follow-up over the past couple of weeks going back and forth between urgent care, primary care, cardiology, and pulmonary.  Patient has finished doxycycline as well as a prednisone taper.  Now is about to finish his azithromycin/Z-Pak tomorrow.  Patient has had multiple chest x-ray showing no acute findings.  Patient also reporting that they have started using Tessalon Perles which is not helped to slow the cough.  Yesterday patient started doing fluticasone nasal nose spray 1 spray each nostril daily as well as taking a daily Zyrtec.  Patient and wife admit that this did improve the cough subtly yesterday.  Patient reporting white mucus occasionally with cough.   Patient is mainly reporting cough as well as fatigue.  Patient wants to "feel better and get back to doing the things that he used to do".  Patient denies any fevers.  Patient has been using nebulizer 1 time daily.  Tests:  Imaging:  10/23/2017-chest x-ray- small bilateral pleural effusions, no significant abnormality seen on chest 10/14/2017 chest x-ray-no acute cardiopulmonary disease, lungs are well-expanded 08/06/2017-CT chest high-res- subtle fibrotic changes in extreme lung bases, multiple pulmonary nodules scattered throughout the lungs stable in size compared with 03/19/2016 CT 08/06/2017-VQ scan- normal ventilation improved perfusion lung scan, low risk for PE  Pulmonary function test 07/07/2017- spirometry is  grossly normal, no significant bronchodilator response, lung volumes normal, decreased diffusion capacity.  Cardiac Tests 10/24/2017-echocardiogram- LV ejection fraction 35 to 14%, grade 1 diastolic dysfunction,  moderate LVH, systolic function was moderately reduced, mild regurgitation with mitral valve 05/13/2017-echocardiogram- LV ejection fraction 40 to 48%, grade 1 diastolic dysfunction   Lab:  10/23/2017-BNP-146.7 10/20/2017-BNP-146  Chart Review:  09/06/2017-emergency room visit-acute upper respiratory infection-chest x-ray inconclusive considering probably viral, still started on doxycycline and prednisone with albuterol nebulizers and rescue inhalers to be used 09/07/2017-seen by cardiology-advised to not take prednisone, appears clinically stable per assessment 09/11/2017-office visit- primary care- diagnosed with bronchitis, Pulmicort started, prednisone given, albuterol given 10/13/2017-primary care- bronchitis, lower back pain, x-ray of spine, Tylenol 3 given. 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  Reporting continued fatigue, shortness of breath with exertion.  Was sent home with 2 L of oxygen at the request of his family.  Patient did not qualify for oxygen coverage due to sats maintaining greater than 92% when in the hospital.  Family requesting to be retested today as they are noticing times where his oxygen sats have dropped to 85% or 76% over the past week.  We will recheck today in office.  Spouse and daughter reporting that PCP follow-up is next week, and cardiology follow-up is on 11/05/2017.  On arrival to  appointment today patient's blood pressure was low.  Family reports they have been checking his blood pressure at home and blood pressure has been higher than today's visit.  We will recheck the end of today's appointment.    Allergies  Allergen Reactions  . Ciprofloxacin Anaphylaxis  . Hydrochlorothiazide Anaphylaxis  . Sulfa Antibiotics Anaphylaxis  . Ace Inhibitors Cough  . Losartan Cough  . Carvedilol Cough    Pt states it "causes him too cough."  . Lisinopril Cough    Pt reports worsening cough and "feeling wheezy."    Immunization History  Administered Date(s) Administered  . Influenza,inj,Quad PF,6+ Mos 03/26/2017  . Pneumococcal Conjugate-13 04/30/2017  . Tdap 04/01/2015    Past Medical History:  Diagnosis Date  . Age-related macular degeneration   . Allergic rhinitis   . Aortic valve stenosis   . Arthritis    "hands, lower back" (04/30/2016)  . Asthma   . Carotid artery obstruction   . Chronic lower back pain   . Coronary arteriosclerosis in native artery   . Diverticulitis of colon   . DJD (degenerative joint disease)   . Dyspnea    "constant but the degree changes"  . Dysthymia   . GERD (gastroesophageal reflux disease)   . History of elevated PSA 05/2009  . History of hiatal hernia   . HLD (hyperlipidemia)   . Hypertension   . Nocturnal hypoxemia   . OSA (obstructive sleep apnea)    intolerant to CPAP  . Paroxysmal supraventricular tachycardia ([place])   . Pneumonia    "when I was a kid"  . Presence of permanent cardiac pacemaker   . Primary malignant neoplasm of  bladder ([place])   . Prostate cancer ([place])     Tobacco History: Social History   Tobacco Use  Smoking Status Never Smoker  Smokeless Tobacco Never Used   Counseling given: Yes   Outpatient Encounter Medications as of 11/03/2017  Medication Sig  . acetaminophen (TYLENOL) 500 MG tablet Take 500 mg by mouth as needed for mild pain.   Marland Kitchen albuterol (PROVENTIL) (2.5 MG/3ML) 0.083% nebulizer  solution Take 3 mLs (2.5 mg total) by nebulization every 8 (eight) hours as needed for wheezing or shortness of breath.  Marland Kitchen amLODipine (NORVASC) 5 MG tablet Take 1 tablet (5 mg total) by mouth daily.  Marland Kitchen apixaban (ELIQUIS) 2.5 MG TABS tablet Take 1 tablet (2.5 mg total) by mouth 2 (two) times daily.  Marland Kitchen atorvastatin (LIPITOR) 20 MG tablet Take 1 tablet (20 mg total) by mouth daily.  . budesonide (PULMICORT) 0.5 MG/2ML nebulizer solution Take 2 mLs (0.5 mg total) by nebulization every 8 (eight) hours as needed.  . budesonide-formoterol (SYMBICORT) 80-4.5 MCG/ACT inhaler Inhale 2 puffs into the lungs 2 (two) times daily.  . cholecalciferol (VITAMIN D) 1000 units tablet Take 1,000 Units by mouth 2 (two) times a week.   . Coenzyme Q10 (COQ-10 PO) Take 1 capsule by mouth 2 (two) times a week.   . Cyanocobalamin (VITAMIN B 12 PO) Take 1 capsule by mouth daily.  . furosemide (LASIX) 20 MG tablet Take 1 tablet (20 mg total) by mouth daily as needed for edema.  Marland Kitchen ipratropium-albuterol (DUONEB) 0.5-2.5 (3) MG/3ML SOLN Take 3 mLs by nebulization every 6 (six) hours as needed. Dx: R06.09  . latanoprost (XALATAN) 0.005 % ophthalmic solution Place 1 drop into both eyes at bedtime.  Marland Kitchen levothyroxine (SYNTHROID, LEVOTHROID) 25 MCG tablet Take 0.5 tablets (12.5 mcg total) by mouth daily before breakfast.  . menthol-cetylpyridinium (CEPACOL) 3 MG lozenge Take 1 lozenge (3 mg total) by mouth as needed for sore throat.  . metoprolol succinate (TOPROL XL) 25 MG 24 hr tablet Take 1 tablet (25 mg total) by mouth daily.  . nitroGLYCERIN (NITROSTAT) 0.4 MG SL tablet Place 1 tablet (0.4 mg total) under the tongue every 5 (five) minutes as needed.  . [DISCONTINUED] budesonide (PULMICORT) 0.5 MG/2ML nebulizer solution Take 2 mLs (0.5 mg total) by nebulization every 8 (eight) hours as needed.  . [DISCONTINUED] ipratropium-albuterol (DUONEB) 0.5-2.5 (3) MG/3ML SOLN Take 3 mLs by nebulization every 6 (six) hours as needed. Dx: R06.09   . predniSONE (DELTASONE) 10 MG tablet Take 4 tablets (40 mg) daily for the next week, take 3 tablets (30 mg) daily for the following week, then take 2 tablets (20 mg) daily until follow-up appointment   No facility-administered encounter medications on file as of 11/03/2017.      Review of Systems  Constitutional: +fatigue, sleeping changes  No  weight loss, night sweats,  fevers, chills HEENT:  +headaches,  No Difficulty swallowing,  Tooth/dental problems, or  Sore throat, No sneezing, itching, ear ache, nasal congestion, post nasal drip  CV: No chest pain,  orthopnea, PND, swelling in lower extremities, anasarca, dizziness, palpitations, syncope  GI: No heartburn, indigestion, abdominal pain, nausea, vomiting, diarrhea, change in bowel habits, loss of appetite, bloody stools Resp: +cough, dry and productive, sob with exertion, using home 02 as needed for sats No shortness of breath at rest  No change in color of mucus.  No wheezing.  No chest wall deformity Skin: no rash, lesions, no skin changes. GU: no dysuria, change in color of urine,  no urgency or frequency.  No flank pain, no hematuria  MS:  No joint pain or swelling.  No decreased range of motion.  No back pain. Psych:  No change in mood or affect. No depression or anxiety.  No memory loss.   Physical Exam  BP (!) 94/58 (BP Location: Left Arm, Cuff Size: Normal)   Pulse 81   Ht 5\' 5"  (1.651 m)   Wt 142 lb (64.4 kg)   BMI 23.63 kg/m   BP Readings from Last 3 Encounters:  11/03/17 (!) 94/58  10/24/17 131/67  10/22/17 108/60   Repeat in office today - 102/62   GEN: A/Ox3; pleasant , NAD, chronically ill   HEENT:  [place]/AT, THROAT-clear  NECK:  Supple w/ fair ROM; no JVD; normal carotid impulses w/o bruits; +bilaterally occipital lymphadenopathy.    RESP:  +coughing with deep breaths, good air movement, no wheeze  no accessory muscle use, no dullness to percussion  CARD:  RRR, no m/r/g, no peripheral edema, pulses intact,  no cyanosis or clubbing.  GI:   Soft & nt; nml bowel sounds  Musco: Warm bil, no deformities or joint swelling noted.   Neuro: alert, no focal deficits noted.    Skin: Warm, no lesions or rashes    Lab Results:  CBC    Component Value Date/Time   WBC 8.7 10/23/2017 1450   RBC 4.45 10/23/2017 1450   HGB 11.4 (L) 10/23/2017 1450   HGB 12.6 (L) 08/21/2016 0831   HCT 35.6 (L) 10/23/2017 1450   HCT 39.1 08/21/2016 0831   PLT 214 10/23/2017 1450   PLT 226 08/21/2016 0831   MCV 80.0 10/23/2017 1450   MCV 85 08/21/2016 0831   MCH 25.6 (L) 10/23/2017 1450   MCHC 32.0 10/23/2017 1450   RDW 17.3 (H) 10/23/2017 1450   RDW 15.1 08/21/2016 0831   LYMPHSABS 0.9 10/23/2017 1450   LYMPHSABS 1.0 08/21/2016 0831   MONOABS 0.7 10/23/2017 1450   EOSABS 0.1 10/23/2017 1450   EOSABS 0.3 08/21/2016 0831   BASOSABS 0.0 10/23/2017 1450   BASOSABS 0.1 08/21/2016 0831    BMET    Component Value Date/Time   NA 138 10/23/2017 1450   K 3.7 10/23/2017 1450   CL 105 10/23/2017 1450   CO2 24 10/23/2017 1450   GLUCOSE 104 (H) 10/23/2017 1450   BUN 21 (H) 10/23/2017 1450   CREATININE 1.01 10/23/2017 1450   CREATININE 1.18 (H) 05/22/2016 1225   CALCIUM 8.6 (L) 10/23/2017 1450   GFRNONAA >60 10/23/2017 1450   GFRNONAA 62 03/14/2016 0945   GFRAA >60 10/23/2017 1450   GFRAA 72 03/14/2016 0945    BNP    Component Value Date/Time   BNP 146.7 (H) 10/23/2017 1938    ProBNP    Component Value Date/Time   PROBNP 146.0 (H) 10/20/2017 1058    Imaging: Dg Chest 2 View  Result Date: 10/23/2017 CLINICAL DATA:  Syncope. EXAM: CHEST - 2 VIEW COMPARISON:  Radiographs of Oct 14, 2017. FINDINGS: Stable cardiomediastinal silhouette. Aortic valve prosthesis is noted. Left-sided pacemaker is unchanged in position. No pneumothorax is noted. No consolidative process is noted. Small bilateral pleural effusions are noted posteriorly. The visualized skeletal structures are unremarkable. IMPRESSION: Small  bilateral pleural effusions. No other significant abnormality seen in the chest. Electronically Signed   By: Marijo Conception, M.D.   On: 10/23/2017 15:25   Dg Chest 2 View  Result Date: 10/14/2017 CLINICAL DATA:  Cough and wheezing due to bronchitis. History  of valve replacement and pacemaker placement. Nonsmoker. EXAM: CHEST - 2 VIEW COMPARISON:  PA and lateral chest x-ray of September 06, 2017 FINDINGS: The lungs are well-expanded. There is no focal infiltrate. There is no pleural effusion. The heart and pulmonary vascularity are normal. And prosthetic aortic valve cage is present. The ICD is in stable position. There is calcification in the wall of the aortic arch. The bony thorax exhibits no acute abnormality. IMPRESSION: There is no active cardiopulmonary disease. Electronically Signed   By: David  Martinique M.D.   On: 10/14/2017 15:01   Dg Lumbar Spine Complete  Result Date: 10/14/2017 CLINICAL DATA:  Worsening low back pain for the past 2 weeks with no known injury. EXAM: LUMBAR SPINE - COMPLETE 4+ VIEW COMPARISON:  Abdominal CT scan of March 19, 2016 FINDINGS: The colonic stool burden is increased diffusely. There is mild levocurvature centered at L4-5. There is grade 2 anterolisthesis of L5 with respect to S1 measuring approximately 1.7 cm. There is degenerative disc space narrowing at L1-2 and L2-3 as well as at L5-S1. There are bilateral pars defects at L5. The pedicles appear intact. The observed portions of the sacrum are normal. IMPRESSION: Grade 2 anterolisthesis of L5 with respect S1 secondary to degenerative disc disease and bilateral pars defects. Moderate to severe disc space narrowing at L1-2 and at L2-3 consistent with degenerative change. Electronically Signed   By: David  Martinique M.D.   On: 10/14/2017 14:59   Ct Head Wo Contrast  Result Date: 10/23/2017 CLINICAL DATA:  84 y/o  M; syncope. EXAM: CT HEAD WITHOUT CONTRAST TECHNIQUE: Contiguous axial images were obtained from the base of the  skull through the vertex without intravenous contrast. COMPARISON:  None. FINDINGS: Brain: No evidence of acute infarction, hemorrhage, hydrocephalus, extra-axial collection or mass lesion/mass effect. Mild chronic microvascular ischemic changes and parenchymal volume loss of the brain for age. Small chronic lacunar infarctions within left caudate body and thalamus. Vascular: Calcific atherosclerosis of carotid siphons. No hyperdense vessel identified. Skull: Normal. Negative for fracture or focal lesion. Sinuses/Orbits: No acute finding. Other: 16 mm lipoma within the left suboccipital myocutaneous junction. IMPRESSION: 1. No acute intracranial abnormality identified. 2. Mild for age chronic microvascular ischemic changes and parenchymal volume loss of the brain. Electronically Signed   By: Kristine Garbe M.D.   On: 10/23/2017 14:30     Assessment & Plan:   84 year old patient seen in office today for follow-up from hospital as well as follow-up regarding his chronic shortness of breath, cough, amiodarone pulmonary toxicity.  No sed rate seen in chart review will get today.  Also not seen the patient has been started on prednisone for treatment of the amiodarone pulmonary toxicity.  Will start prednisone patient today.  Discussed with patient that prednisone dose is typically between 40 and 60 mg daily.  Patient is apprehensive to do high doses of prednisone as it affects his sleep.  Will do slow taper starting at 40 mg daily today, and will work down to 20 mg daily in 2 weeks.  Patient and wife to follow-up with office if they are having issues or need to stop and go down on the prednisone.  Follow-up with office in 3 to 4 weeks.  Walk in office today patient's oxygen saturations did not drop when off oxygen: At rest oxygen was 96 to 97%, with exertion lowest sats were 92%.  This does not qualify the patient for oxygen.  Informed patient to have close follow-up with her office and  to check SPO2  at home.  Amiodarone pulmonary toxicity Labs today  >>> Sed rate  Prednisone -for the amiodarone toxicity >>>40mg  daily for one week >>>30mg  for the following week  >>>20mg  for the following weeks after indefinitely until follow up appointment.  >>>take with food in the AM  >>> Remember to have close follow-up with our office, how you are feeling if this does help manage your symptoms then we can actually go up on this dose.  Continue follow up with PCP and Cardiology over the next 2 weeks  >>> Discuss headaches >>> Discuss daily prednisone started from our office for your amiodarone toxicity   Continue using your rescue inhaler as needed for shortness of breath  Walk today in office -maintained oxygen saturations greater than 92% with exertion off oxygen >>> Continue to follow-up with our office if you notice your oxygen saturations are dropping >>> Use your oxygen that you have at home as needed to maintain saturations  Need to check blood pressures at home >>> Notify primary care if trending down  Continue to check oxygen saturations at home, sporadically at rest as well as with exertion >>> Goal SPO2 sat should be greater than 90% >>> If you are needing oxygen therapy in order to maintain this please notify our office  Follow-up with our office in the next 3 to 4 weeks >>> With Dr. Lamonte Sakai or myself if he is unavailable   DOE (dyspnea on exertion) Prednisone -for the amiodarone toxicity >>>40mg  daily for one week >>>30mg  for the following week  >>>20mg  for the following weeks after indefinitely until follow up appointment.  >>>take with food in the AM  >>> Remember to have close follow-up with our office, how you are feeling if this does help manage your symptoms then we can actually go up on this dose.  Refilled DuoNeb's and Pulmicort at your request.  >>> These can be used as needed, does not need to be scheduled  Continue using your rescue inhaler as needed for  shortness of breath  Walk today in office -maintained oxygen saturations greater than 92% with exertion off oxygen >>> Continue to follow-up with our office if you notice your oxygen saturations are dropping >>> Use your oxygen that you have at home as needed to maintain saturations  Continue to check oxygen saturations at home, sporadically at rest as well as with exertion >>> Goal SPO2 sat should be greater than 90% >>> If you are needing oxygen therapy in order to maintain this please notify our office  Limit daytime naps, long rest periods during the day.  To try to improve nighttime sleep.   Follow-up with our office in the next 3 to 4 weeks >>> With Dr. Lamonte Sakai or myself if he is unavailable      Lauraine Rinne, NP 11/03/2017

## 2017-11-04 ENCOUNTER — Encounter: Payer: Self-pay | Admitting: Physician Assistant

## 2017-11-04 NOTE — Progress Notes (Signed)
Lab results have come back.  Showing an elevated sedimentation rate.  As discussed in your office visit this was to be expected given your other clinical picture.  This can be seen in the amiodarone pulmonary toxicity.  This is a marker.  No changes in the plan of care.    Let us continue with the prednisone as discussed in your office visit.  I followed up with Dr. Lamonte Sakai he agrees with this plan of care.  If you have any questions or concerns or feel like you are unable to tolerate prednisone insomnia please follow-up with [place].  I hope you have a good appointment with your cardiologist tomorrow.  Hang in there! Pleasure taking care of you yesterday!  Wyn Quaker FNP

## 2017-11-04 NOTE — Progress Notes (Signed)
Cardiology Office Note    Date:  11/05/2017  ID:  [name], DOB Jan 14, 1930, MRN [id number] PCP:  Dorothyann Peng, NP  Cardiologist:  Ena Dawley, MD   Chief Complaint: f/u afib  History of Present Illness:  Ryan [name] is a 84 y.o. 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  In 8/17, he developed worsening dyspnea with exertion and fatigue. Cardiac catheterization demonstrated worsening CAD with 70% mid LCx, 80% OM1 and 80% mid RCA stenosis. PCI would be high risk. Medical therapy was recommended initially. He then saw Dr. Roxy Manns in surgical consultation. He was felt to have stage D severe symptomatic aortic stenosis. Work up for AVR vs TAVR was initiated.  He was then admitted in 10/17 with unstable angina in the setting of AF with RVR. This was complicated by tachybradycardia syndrome and he underwent pacemaker implantation with Dr. Lovena Le. In 11/17, he underwent successful rotational atherectomy and DES to RCA. He underwent TAVR 06/2016. In 11/2016 he had near-syncope secondary to orthostatic hypotension. PPM 12/2016 demonstrated recurrence of atrial fibrillation and he was restarted on amiodarone. He was seen by pulmonology given mild interstitial lung disease on CT and Dr. Lamonte Sakai concluded that changes were minor and he didn't believe that there was indication to stop amiodarone at that time. When seen in follow-up 10/22/17 Dr. Lovena Le was concerned for amiodarone toxicity given combination of cough, fatigue, dyspnea and reduced DLCO so amiodarone was stopped. He was maintaining NSR by interrogation. He was admitted 5/24-5/25 with syncope without clear etiology. Device interrogation was reportedly unremarkable per discharge summary. Orthostatic vitals were  unremarkable. He did have evidence of acute hypoxic respiratory failure now felt to represent chronic hypoxic respiratory failure so O2 was arranged. TSH was normal. BNP 146. D-dimer 0.57, BUN 21, Cr 1.01, albumin 3.1, Hgb 11.4, Hemoccult neg. 2D echo 10/24/17 showed mod LVH, EF 35-40%, grade 1 DD, mild-moderate AS, PASP 20mmHg. EF was 50-55% after TAVR, and 40-45% in 05/2017. He was seen by pulm NP yesterday who checked a sed rate (high) and started prednisone.  Historically per Dr. Francesca Oman note he has had severe fatigue which has not really been improved with any cardiac interventions. Dr. Meda Coffee has suspected depression due to patient's decline and has advised he discuss with primary care. He returns for follow-up today with his daughter Ryan [name]. He did not sleep well last night because of the prednisone and feels "lousy" today. Ryan [name] states this is usually the case if he does not sleep well. He denies any CP or SOB. He had edema last week but no swelling today. He has had intermittent blurriness in his right eye. There is no visual field loss but a generalized blurriness. The first episode they noticed was when he was in the shower without his oxygen. He also tends to have a headache and nausea when O2 is low. He has no focal deficit today. He did have some right eye blurriness today but his O2 condenser was not set at the 3L he was supposed to be on. The blurriness is superimposed on intermittent eye issues over the years for which he's had surgery.   Past Medical History:  Diagnosis Date  . Age-related macular degeneration   . Allergic rhinitis   . Amiodarone toxicity   . Aortic valve stenosis  a. s/p TAVR 06/2016.  . Arthritis    "hands, lower back" (04/30/2016)  . Asthma   . Cardiomyopathy ([place])    a. EF declined to 35-40% by echo 09/2017 (previously 50-55% after TAVR).  . Carotid artery obstruction    a. s/p bilat CEA.  . Chronic combined systolic and diastolic CHF (congestive heart  failure) ([place])   . Chronic lower back pain   . Coronary arteriosclerosis in native artery    a. 2017 Cardiac catheterization demonstrated worsening CAD with 70% mid LCx, 80% OM1 and 80% mid RCA stenosis. b.  In 11/17, he underwent successful rotational atherectomy and DES to RCA.  . Diverticulitis of colon   . DJD (degenerative joint disease)   . Dyspnea    "constant but the degree changes"  . GERD (gastroesophageal reflux disease)   . History of elevated PSA 05/2009  . History of hiatal hernia   . HLD (hyperlipidemia)   . Hypertension   . Nocturnal hypoxemia   . OSA (obstructive sleep apnea)    intolerant to CPAP  . PAF (paroxysmal atrial fibrillation) ([place])   . Paroxysmal supraventricular tachycardia ([place])   . Pneumonia    "when I was a kid"  . Presence of permanent cardiac pacemaker   . Primary malignant neoplasm of bladder ([place])   . Prostate cancer [facility])     Past Surgical History:  Procedure Laterality Date  . APPENDECTOMY    . CARDIAC CATHETERIZATION N/A 01/17/2016   Procedure: Right/Left Heart Cath and Coronary Angiography;  Surgeon: Belva Crome, MD;  Location: [place] [facility];  Service: Cardiovascular;  Laterality: N/A;  . CARDIAC CATHETERIZATION N/A 04/30/2016   Procedure: Coronary/Graft Atherectomy;  Surgeon: Sherren Mocha, MD;  Location: [place] [facility];  Service: Cardiovascular;  Laterality: N/A;  . CAROTID ENDARTERECTOMY Bilateral   . CATARACT EXTRACTION W/ INTRAOCULAR LENS  IMPLANT, BILATERAL Bilateral   . COLON SURGERY  1981   Meckles Diverticulum with volvulus  . EP IMPLANTABLE DEVICE N/A 03/21/2016   Procedure: Pacemaker Implant;  Surgeon: Evans Lance, MD;  Location: [place] [facility];  Service: Cardiovascular;  Laterality: N/A;  . INGUINAL HERNIA REPAIR Right 2009  . INSERTION PROSTATE RADIATION SEED    . TEE WITHOUT CARDIOVERSION N/A 06/10/2016   Procedure: TRANSESOPHAGEAL ECHOCARDIOGRAM (TEE);  Surgeon: Sherren Mocha, MD;  Location: [place];   Service: Open Heart Surgery;  Laterality: N/A;  . TONSILLECTOMY  ~ 1947  . TRANSCATHETER AORTIC VALVE REPLACEMENT, TRANSFEMORAL N/A 06/10/2016   Procedure: TRANSCATHETER AORTIC VALVE REPLACEMENT, TRANSFEMORAL;  Surgeon: Sherren Mocha, MD;  Location: [place];  Service: Open Heart Surgery;  Laterality: N/A;    Current Medications: Current Meds  Medication Sig  . acetaminophen (TYLENOL) 500 MG tablet Take 500 mg by mouth as needed for mild pain.   Marland Kitchen albuterol (PROVENTIL) (2.5 MG/3ML) 0.083% nebulizer solution Take 3 mLs (2.5 mg total) by nebulization every 8 (eight) hours as needed for wheezing or shortness of breath.  Marland Kitchen amLODipine (NORVASC) 5 MG tablet Take 1 tablet (5 mg total) by mouth daily.  Marland Kitchen apixaban (ELIQUIS) 2.5 MG TABS tablet Take 1 tablet (2.5 mg total) by mouth 2 (two) times daily.  Marland Kitchen atorvastatin (LIPITOR) 20 MG tablet Take 1 tablet (20 mg total) by mouth daily.  . budesonide (PULMICORT) 0.5 MG/2ML nebulizer solution Take 2 mLs (0.5 mg total) by nebulization every 8 (eight) hours as needed.  . budesonide-formoterol (SYMBICORT) 80-4.5 MCG/ACT inhaler Inhale 2 puffs into the lungs 2 (two) times  daily.  . cholecalciferol (VITAMIN D) 1000 units tablet Take 1,000 Units by mouth 2 (two) times a week.   . Coenzyme Q10 (COQ-10 PO) Take 1 capsule by mouth 2 (two) times a week.   . Cyanocobalamin (VITAMIN B 12 PO) Take 1 capsule by mouth daily.  . furosemide (LASIX) 20 MG tablet Take 1 tablet (20 mg total) by mouth daily as needed for edema.  Marland Kitchen latanoprost (XALATAN) 0.005 % ophthalmic solution Place 1 drop into both eyes at bedtime.  Marland Kitchen levothyroxine (SYNTHROID, LEVOTHROID) 25 MCG tablet Take 0.5 tablets (12.5 mcg total) by mouth daily before breakfast.  . menthol-cetylpyridinium (CEPACOL) 3 MG lozenge Take 1 lozenge (3 mg total) by mouth as needed for sore throat.  . metoprolol succinate (TOPROL XL) 25 MG 24 hr tablet Take 1 tablet (25 mg total) by mouth daily.  . nitroGLYCERIN (NITROSTAT) 0.4  MG SL tablet Place 1 tablet (0.4 mg total) under the tongue every 5 (five) minutes as needed.  . predniSONE (DELTASONE) 10 MG tablet Take 4 tablets (40 mg) daily for the next week, take 3 tablets (30 mg) daily for the following week, then take 2 tablets (20 mg) daily until follow-up appointment    Allergies:   Ciprofloxacin; Hydrochlorothiazide; Sulfa antibiotics; Ace inhibitors; Losartan; Carvedilol; and Lisinopril   Social History   Socioeconomic History  . Marital status: Married    Spouse name: Not on file  . Number of children: Not on file  . Years of education: Not on file  . Highest education level: Not on file  Occupational History  . Occupation: retired  Scientific laboratory technician  . Financial resource strain: Not on file  . Food insecurity:    Worry: Not on file    Inability: Not on file  . Transportation needs:    Medical: Not on file    Non-medical: Not on file  Tobacco Use  . Smoking status: Never Smoker  . Smokeless tobacco: Never Used  Substance and Sexual Activity  . Alcohol use: No    Alcohol/week: 0.0 oz    Comment: 04/30/2016 "nothing in years"  . Drug use: No  . Sexual activity: Never  Lifestyle  . Physical activity:    Days per week: Not on file    Minutes per session: Not on file  . Stress: Not on file  Relationships  . Social connections:    Talks on phone: Not on file    Gets together: Not on file    Attends religious service: Not on file    Active member of club or organization: Not on file    Attends meetings of clubs or organizations: Not on file    Relationship status: Not on file  Other Topics Concern  . Not on file  Social History Narrative   Retired    Three children - One in [place], One in [place], and one in [place].         Family History:  The patient's family history includes Heart disease in his father and mother.  ROS:   Please see the history of present illness.  All other systems are reviewed and otherwise negative.    PHYSICAL  EXAM:   VS:  BP 124/70   Pulse 74   Ht 5\' 5"  (1.651 m)   Wt 140 lb (63.5 kg)   SpO2 92%   BMI 23.30 kg/m   BMI: Body mass index is 23.3 kg/m. GEN: Well nourished, well developed chronically ill appearing WM, in no acute distress  HEENT: normocephalic, atraumatic Neck: no JVD, carotid bruits, or masses Cardiac: RRR; no murmurs, rubs, or gallops, no edema  Respiratory:  Diminished throughout without wheezes/rales/rhonchi, normal work of breathing GI: soft, nontender, nondistended, + BS MS: no deformity or atrophy Skin: warm and dry, no rash Neuro:  Alert and Oriented x 3, Strength and sensation are intact, follows commands. reporst generalized R eye blurriness that comes and goes Psych: euthymic mood, full affect  Wt Readings from Last 3 Encounters:  11/05/17 140 lb (63.5 kg)  11/03/17 142 lb (64.4 kg)  10/24/17 142 lb (64.4 kg)      Studies/Labs Reviewed:   EKG:  EKG was ordered today and personally reviewed by me and demonstrates atrial paced rhythm with prolonged AV conduction, NSIVCD  Recent Labs: 10/20/2017: Pro B Natriuretic peptide (BNP) 146.0 10/23/2017: ALT 81; B Natriuretic Peptide 146.7; BUN 21; Creatinine, Ser 1.01; Hemoglobin 11.4; Platelets 214; Potassium 3.7; Sodium 138; TSH 1.162   Lipid Panel    Component Value Date/Time   CHOL 130 05/01/2017 0827   TRIG 70.0 05/01/2017 0827   HDL 56.00 05/01/2017 0827   CHOLHDL 2 05/01/2017 0827   VLDL 14.0 05/01/2017 0827   LDLCALC 60 05/01/2017 0827    Additional studies/ records that were reviewed today include: Summarized above  ASSESSMENT & PLAN:   1. CAD - overall stable. No angina. Not on ASA due to concomitant Eliquis. 2. Chronic combined CHF with cardiomyopathy - LV function has declined over the last several years. He's not had any angina. D/w Dr. Meda Coffee who does not suspect that invasive evaluation of this and his aortic stenosis would really change management or his overall quality of life. We will focus on  medical therapy for now, stopping amlodipine due to low EF and adding spironolactone 12.5mg  daily with repeat BMET in 5 days. We will avoid Imdur for now given h/o headaches. Continue metoprolol. Allergies/intolerances noted to ACEI/ARB in the past. Appears euvolemic on exam. 3. Paroxysmal atrial fib with amiodarone toxicity - unfortunately patient has not been a candidate for many of the antiarrhythmics given his known CAD. Amiodarone was stopped as above. He is atrial pacing today with controlled rate. He has not missed any doses of apixaban. Will continue surveillance. If he has symptomatic afib in the future might need to consider dofetilide but would have to review QTc with EP. 4. Eye blurriness - somewhat atypical for neurologic issue. Reviewed with Dr. Meda Coffee - recommend to use O2 at adequate level and recommend he reach out to primary care to discuss. He needs to get back into eye doctor was well since retinopathy could also be a side effect of amiodarone.  Disposition: F/u with Dr. Marjo Bicker (care team if available) in several weeks to f/u.  Medication Adjustments/Labs and Tests Ordered: Current medicines are reviewed at length with the patient today.  Concerns regarding medicines are outlined above. Medication changes, Labs and Tests ordered today are summarized above and listed in the Patient Instructions accessible in Encounters.   Signed, Charlie Pitter, PA-C  11/05/2017 12:11 PM    [facility] [address], [place], [place]  [address] Phone: [phone]; Fax: [phone]

## 2017-11-05 ENCOUNTER — Encounter: Payer: Self-pay | Admitting: Physician Assistant

## 2017-11-05 ENCOUNTER — Ambulatory Visit: Payer: Medicare Other | Admitting: Physician Assistant

## 2017-11-05 VITALS — BP 124/70 | HR 74 | Ht 65.0 in | Wt 140.0 lb

## 2017-11-05 DIAGNOSIS — I480 Paroxysmal atrial fibrillation: Secondary | ICD-10-CM | POA: Diagnosis not present

## 2017-11-05 DIAGNOSIS — I5042 Chronic combined systolic (congestive) and diastolic (congestive) heart failure: Secondary | ICD-10-CM

## 2017-11-05 DIAGNOSIS — I429 Cardiomyopathy, unspecified: Secondary | ICD-10-CM | POA: Diagnosis not present

## 2017-11-05 DIAGNOSIS — T462X5A Adverse effect of other antidysrhythmic drugs, initial encounter: Secondary | ICD-10-CM

## 2017-11-05 DIAGNOSIS — H538 Other visual disturbances: Secondary | ICD-10-CM | POA: Diagnosis not present

## 2017-11-05 DIAGNOSIS — J984 Other disorders of lung: Secondary | ICD-10-CM | POA: Diagnosis not present

## 2017-11-05 DIAGNOSIS — I2510 Atherosclerotic heart disease of native coronary artery without angina pectoris: Secondary | ICD-10-CM | POA: Diagnosis not present

## 2017-11-05 MED ORDER — SPIRONOLACTONE 25 MG PO TABS: 12.5000 mg | ORAL_TABLET | Freq: Every day | ORAL | 1 refills | Status: DC

## 2017-11-05 NOTE — Patient Instructions (Addendum)
Medication Instructions:  Your physician has recommended you make the following change in your medication: 1.  STOP the Amlodipine 2.  START the Spironolactone 25 mg taking 1/2 tablet daily   Labwork: IN 5 DAYS:  BMET  Testing/Procedures: None ordered  Follow-Up: Your physician recommends that you schedule a follow-up appointment in: [place] [facility] APP ON A DAY SHE IS IN THE OFFICE   Any Other Special Instructions Will Be Listed Below (If Applicable).     If you need a refill on your cardiac medications before your next appointment, please call your pharmacy.

## 2017-11-07 ENCOUNTER — Emergency Department (HOSPITAL_COMMUNITY): Payer: Medicare Other

## 2017-11-07 ENCOUNTER — Encounter (HOSPITAL_COMMUNITY): Payer: Self-pay | Admitting: Emergency Medicine

## 2017-11-07 ENCOUNTER — Inpatient Hospital Stay (HOSPITAL_COMMUNITY)
Admission: EM | Admit: 2017-11-07 | Discharge: 2017-11-14 | DRG: 314 | Disposition: A | Discharge status: Skilled Nursing Facility | Payer: Medicare Other | Attending: Internal Medicine | Admitting: Internal Medicine

## 2017-11-07 ENCOUNTER — Other Ambulatory Visit: Payer: Self-pay

## 2017-11-07 ENCOUNTER — Observation Stay (HOSPITAL_COMMUNITY): Payer: Medicare Other

## 2017-11-07 DIAGNOSIS — I5022 Chronic systolic (congestive) heart failure: Secondary | ICD-10-CM | POA: Diagnosis not present

## 2017-11-07 DIAGNOSIS — M4640 Discitis, unspecified, site unspecified: Secondary | ICD-10-CM | POA: Diagnosis present

## 2017-11-07 DIAGNOSIS — Z952 Presence of prosthetic heart valve: Secondary | ICD-10-CM | POA: Diagnosis not present

## 2017-11-07 DIAGNOSIS — M549 Dorsalgia, unspecified: Secondary | ICD-10-CM | POA: Diagnosis present

## 2017-11-07 DIAGNOSIS — R339 Retention of urine, unspecified: Secondary | ICD-10-CM | POA: Diagnosis not present

## 2017-11-07 DIAGNOSIS — T462X5A Adverse effect of other antidysrhythmic drugs, initial encounter: Secondary | ICD-10-CM

## 2017-11-07 DIAGNOSIS — Z8249 Family history of ischemic heart disease and other diseases of the circulatory system: Secondary | ICD-10-CM

## 2017-11-07 DIAGNOSIS — D72829 Elevated white blood cell count, unspecified: Secondary | ICD-10-CM | POA: Diagnosis not present

## 2017-11-07 DIAGNOSIS — I340 Nonrheumatic mitral (valve) insufficiency: Secondary | ICD-10-CM | POA: Diagnosis not present

## 2017-11-07 DIAGNOSIS — Z882 Allergy status to sulfonamides status: Secondary | ICD-10-CM

## 2017-11-07 DIAGNOSIS — R0603 Acute respiratory distress: Secondary | ICD-10-CM | POA: Diagnosis not present

## 2017-11-07 DIAGNOSIS — Z7901 Long term (current) use of anticoagulants: Secondary | ICD-10-CM

## 2017-11-07 DIAGNOSIS — R55 Syncope and collapse: Secondary | ICD-10-CM | POA: Diagnosis not present

## 2017-11-07 DIAGNOSIS — M47816 Spondylosis without myelopathy or radiculopathy, lumbar region: Secondary | ICD-10-CM | POA: Diagnosis present

## 2017-11-07 DIAGNOSIS — I429 Cardiomyopathy, unspecified: Secondary | ICD-10-CM | POA: Diagnosis not present

## 2017-11-07 DIAGNOSIS — M4317 Spondylolisthesis, lumbosacral region: Secondary | ICD-10-CM | POA: Diagnosis not present

## 2017-11-07 DIAGNOSIS — I10 Essential (primary) hypertension: Secondary | ICD-10-CM | POA: Insufficient documentation

## 2017-11-07 DIAGNOSIS — Z7952 Long term (current) use of systemic steroids: Secondary | ICD-10-CM

## 2017-11-07 DIAGNOSIS — I110 Hypertensive heart disease with heart failure: Secondary | ICD-10-CM | POA: Diagnosis present

## 2017-11-07 DIAGNOSIS — R279 Unspecified lack of coordination: Secondary | ICD-10-CM | POA: Diagnosis not present

## 2017-11-07 DIAGNOSIS — G8929 Other chronic pain: Secondary | ICD-10-CM | POA: Diagnosis present

## 2017-11-07 DIAGNOSIS — T826XXA Infection and inflammatory reaction due to cardiac valve prosthesis, initial encounter: Secondary | ICD-10-CM | POA: Diagnosis not present

## 2017-11-07 DIAGNOSIS — D696 Thrombocytopenia, unspecified: Secondary | ICD-10-CM | POA: Diagnosis present

## 2017-11-07 DIAGNOSIS — N390 Urinary tract infection, site not specified: Secondary | ICD-10-CM | POA: Diagnosis not present

## 2017-11-07 DIAGNOSIS — M19041 Primary osteoarthritis, right hand: Secondary | ICD-10-CM | POA: Diagnosis present

## 2017-11-07 DIAGNOSIS — Z888 Allergy status to other drugs, medicaments and biological substances status: Secondary | ICD-10-CM | POA: Diagnosis not present

## 2017-11-07 DIAGNOSIS — R140 Abdominal distension (gaseous): Secondary | ICD-10-CM

## 2017-11-07 DIAGNOSIS — T826XXD Infection and inflammatory reaction due to cardiac valve prosthesis, subsequent encounter: Secondary | ICD-10-CM | POA: Diagnosis not present

## 2017-11-07 DIAGNOSIS — D509 Iron deficiency anemia, unspecified: Secondary | ICD-10-CM | POA: Diagnosis not present

## 2017-11-07 DIAGNOSIS — Z881 Allergy status to other antibiotic agents status: Secondary | ICD-10-CM | POA: Diagnosis not present

## 2017-11-07 DIAGNOSIS — R7989 Other specified abnormal findings of blood chemistry: Secondary | ICD-10-CM | POA: Diagnosis present

## 2017-11-07 DIAGNOSIS — Z9981 Dependence on supplemental oxygen: Secondary | ICD-10-CM

## 2017-11-07 DIAGNOSIS — Z79899 Other long term (current) drug therapy: Secondary | ICD-10-CM

## 2017-11-07 DIAGNOSIS — M48061 Spinal stenosis, lumbar region without neurogenic claudication: Secondary | ICD-10-CM | POA: Diagnosis present

## 2017-11-07 DIAGNOSIS — R7881 Bacteremia: Secondary | ICD-10-CM

## 2017-11-07 DIAGNOSIS — I350 Nonrheumatic aortic (valve) stenosis: Secondary | ICD-10-CM | POA: Diagnosis not present

## 2017-11-07 DIAGNOSIS — E785 Hyperlipidemia, unspecified: Secondary | ICD-10-CM | POA: Diagnosis present

## 2017-11-07 DIAGNOSIS — G4733 Obstructive sleep apnea (adult) (pediatric): Secondary | ICD-10-CM | POA: Diagnosis present

## 2017-11-07 DIAGNOSIS — M5137 Other intervertebral disc degeneration, lumbosacral region: Secondary | ICD-10-CM | POA: Diagnosis not present

## 2017-11-07 DIAGNOSIS — D6959 Other secondary thrombocytopenia: Secondary | ICD-10-CM | POA: Diagnosis not present

## 2017-11-07 DIAGNOSIS — Z9861 Coronary angioplasty status: Secondary | ICD-10-CM

## 2017-11-07 DIAGNOSIS — J9611 Chronic respiratory failure with hypoxia: Secondary | ICD-10-CM | POA: Diagnosis not present

## 2017-11-07 DIAGNOSIS — B952 Enterococcus as the cause of diseases classified elsewhere: Secondary | ICD-10-CM

## 2017-11-07 DIAGNOSIS — R7982 Elevated C-reactive protein (CRP): Secondary | ICD-10-CM | POA: Diagnosis present

## 2017-11-07 DIAGNOSIS — Z7951 Long term (current) use of inhaled steroids: Secondary | ICD-10-CM

## 2017-11-07 DIAGNOSIS — J984 Other disorders of lung: Secondary | ICD-10-CM

## 2017-11-07 DIAGNOSIS — N179 Acute kidney failure, unspecified: Secondary | ICD-10-CM | POA: Diagnosis present

## 2017-11-07 DIAGNOSIS — Y831 Surgical operation with implant of artificial internal device as the cause of abnormal reaction of the patient, or of later complication, without mention of misadventure at the time of the procedure: Secondary | ICD-10-CM | POA: Diagnosis present

## 2017-11-07 DIAGNOSIS — I482 Chronic atrial fibrillation: Secondary | ICD-10-CM | POA: Diagnosis not present

## 2017-11-07 DIAGNOSIS — A4181 Sepsis due to Enterococcus: Secondary | ICD-10-CM | POA: Diagnosis not present

## 2017-11-07 DIAGNOSIS — R627 Adult failure to thrive: Secondary | ICD-10-CM | POA: Diagnosis present

## 2017-11-07 DIAGNOSIS — M546 Pain in thoracic spine: Secondary | ICD-10-CM | POA: Diagnosis not present

## 2017-11-07 DIAGNOSIS — Z950 Presence of cardiac pacemaker: Secondary | ICD-10-CM | POA: Diagnosis not present

## 2017-11-07 DIAGNOSIS — K219 Gastro-esophageal reflux disease without esophagitis: Secondary | ICD-10-CM | POA: Diagnosis present

## 2017-11-07 DIAGNOSIS — I2510 Atherosclerotic heart disease of native coronary artery without angina pectoris: Secondary | ICD-10-CM | POA: Diagnosis present

## 2017-11-07 DIAGNOSIS — I272 Pulmonary hypertension, unspecified: Secondary | ICD-10-CM | POA: Diagnosis present

## 2017-11-07 DIAGNOSIS — I330 Acute and subacute infective endocarditis: Secondary | ICD-10-CM

## 2017-11-07 DIAGNOSIS — E7800 Pure hypercholesterolemia, unspecified: Secondary | ICD-10-CM | POA: Diagnosis not present

## 2017-11-07 DIAGNOSIS — M545 Low back pain, unspecified: Secondary | ICD-10-CM

## 2017-11-07 DIAGNOSIS — I080 Rheumatic disorders of both mitral and aortic valves: Secondary | ICD-10-CM | POA: Diagnosis not present

## 2017-11-07 DIAGNOSIS — I495 Sick sinus syndrome: Secondary | ICD-10-CM | POA: Diagnosis not present

## 2017-11-07 DIAGNOSIS — Z5181 Encounter for therapeutic drug level monitoring: Secondary | ICD-10-CM | POA: Diagnosis not present

## 2017-11-07 DIAGNOSIS — I480 Paroxysmal atrial fibrillation: Secondary | ICD-10-CM | POA: Diagnosis not present

## 2017-11-07 DIAGNOSIS — J45909 Unspecified asthma, uncomplicated: Secondary | ICD-10-CM | POA: Diagnosis present

## 2017-11-07 DIAGNOSIS — I447 Left bundle-branch block, unspecified: Secondary | ICD-10-CM | POA: Diagnosis not present

## 2017-11-07 DIAGNOSIS — I38 Endocarditis, valve unspecified: Secondary | ICD-10-CM | POA: Diagnosis not present

## 2017-11-07 DIAGNOSIS — I5042 Chronic combined systolic (congestive) and diastolic (congestive) heart failure: Secondary | ICD-10-CM | POA: Diagnosis not present

## 2017-11-07 DIAGNOSIS — I119 Hypertensive heart disease without heart failure: Secondary | ICD-10-CM | POA: Diagnosis not present

## 2017-11-07 DIAGNOSIS — Z79890 Hormone replacement therapy (postmenopausal): Secondary | ICD-10-CM

## 2017-11-07 DIAGNOSIS — Z8546 Personal history of malignant neoplasm of prostate: Secondary | ICD-10-CM

## 2017-11-07 DIAGNOSIS — I4891 Unspecified atrial fibrillation: Secondary | ICD-10-CM | POA: Diagnosis not present

## 2017-11-07 DIAGNOSIS — I471 Supraventricular tachycardia: Secondary | ICD-10-CM | POA: Diagnosis not present

## 2017-11-07 DIAGNOSIS — Z8551 Personal history of malignant neoplasm of bladder: Secondary | ICD-10-CM

## 2017-11-07 DIAGNOSIS — R778 Other specified abnormalities of plasma proteins: Secondary | ICD-10-CM | POA: Diagnosis present

## 2017-11-07 DIAGNOSIS — Z9842 Cataract extraction status, left eye: Secondary | ICD-10-CM

## 2017-11-07 DIAGNOSIS — R935 Abnormal findings on diagnostic imaging of other abdominal regions, including retroperitoneum: Secondary | ICD-10-CM | POA: Diagnosis not present

## 2017-11-07 DIAGNOSIS — M19042 Primary osteoarthritis, left hand: Secondary | ICD-10-CM | POA: Diagnosis present

## 2017-11-07 DIAGNOSIS — H3530 Unspecified macular degeneration: Secondary | ICD-10-CM | POA: Diagnosis present

## 2017-11-07 DIAGNOSIS — R748 Abnormal levels of other serum enzymes: Secondary | ICD-10-CM | POA: Diagnosis not present

## 2017-11-07 DIAGNOSIS — I2584 Coronary atherosclerosis due to calcified coronary lesion: Secondary | ICD-10-CM | POA: Diagnosis present

## 2017-11-07 DIAGNOSIS — Z961 Presence of intraocular lens: Secondary | ICD-10-CM | POA: Diagnosis present

## 2017-11-07 DIAGNOSIS — R9389 Abnormal findings on diagnostic imaging of other specified body structures: Secondary | ICD-10-CM | POA: Diagnosis not present

## 2017-11-07 DIAGNOSIS — Z9841 Cataract extraction status, right eye: Secondary | ICD-10-CM

## 2017-11-07 DIAGNOSIS — E860 Dehydration: Secondary | ICD-10-CM | POA: Diagnosis not present

## 2017-11-07 DIAGNOSIS — Z743 Need for continuous supervision: Secondary | ICD-10-CM | POA: Diagnosis not present

## 2017-11-07 HISTORY — DX: Dorsalgia, unspecified: M54.9

## 2017-11-07 HISTORY — DX: Retention of urine, unspecified: R33.9

## 2017-11-07 HISTORY — DX: Thrombocytopenia, unspecified: D69.6

## 2017-11-07 LAB — COMPREHENSIVE METABOLIC PANEL
ALT: 49 U/L (ref 17–63)
AST: 48 U/L — AB (ref 15–41)
Albumin: 3 g/dL — ABNORMAL LOW (ref 3.5–5.0)
Alkaline Phosphatase: 90 U/L (ref 38–126)
Anion gap: 9 (ref 5–15)
BILIRUBIN TOTAL: 0.9 mg/dL (ref 0.3–1.2)
BUN: 20 mg/dL (ref 6–20)
CO2: 25 mmol/L (ref 22–32)
CREATININE: 1.18 mg/dL (ref 0.61–1.24)
Calcium: 8.7 mg/dL — ABNORMAL LOW (ref 8.9–10.3)
Chloride: 107 mmol/L (ref 101–111)
GFR calc Af Amer: >60 mL/min (ref >60)
GFR, EST NON AFRICAN AMERICAN: 54 mL/min — AB (ref >60)
Glucose, Bld: 97 mg/dL (ref 65–99)
POTASSIUM: 4 mmol/L (ref 3.5–5.1)
Sodium: 141 mmol/L (ref 135–145)
TOTAL PROTEIN: 6 g/dL — AB (ref 6.5–8.1)

## 2017-11-07 LAB — CBC WITH DIFFERENTIAL/PLATELET
Abs Immature Granulocytes: 0.3 10*3/uL — ABNORMAL HIGH (ref 0.0–0.1)
BASOS ABS: 0 10*3/uL (ref 0.0–0.1)
BASOS PCT: 0 %
EOS ABS: 0 10*3/uL (ref 0.0–0.7)
EOS PCT: 0 %
HCT: 39.5 % (ref 39.0–52.0)
Hemoglobin: 12.3 g/dL — ABNORMAL LOW (ref 13.0–17.0)
Immature Granulocytes: 2 %
LYMPHS PCT: 6 %
Lymphs Abs: 0.7 10*3/uL (ref 0.7–4.0)
MCH: 25.6 pg — ABNORMAL LOW (ref 26.0–34.0)
MCHC: 31.1 g/dL (ref 30.0–36.0)
MCV: 82.3 fL (ref 78.0–100.0)
Monocytes Absolute: 1 10*3/uL (ref 0.1–1.0)
Monocytes Relative: 9 %
Neutro Abs: 9.3 10*3/uL — ABNORMAL HIGH (ref 1.7–7.7)
Neutrophils Relative %: 83 %
PLATELETS: 60 10*3/uL — AB (ref 150–400)
RBC: 4.8 MIL/uL (ref 4.22–5.81)
RDW: 19.4 % — AB (ref 11.5–15.5)
WBC: 11.3 10*3/uL — AB (ref 4.0–10.5)

## 2017-11-07 LAB — URINALYSIS, ROUTINE W REFLEX MICROSCOPIC
Bilirubin Urine: NEGATIVE
GLUCOSE, UA: NEGATIVE mg/dL
Hgb urine dipstick: NEGATIVE
KETONES UR: NEGATIVE mg/dL
LEUKOCYTES UA: NEGATIVE
Nitrite: NEGATIVE
PH: 6 (ref 5.0–8.0)
Protein, ur: NEGATIVE mg/dL
Specific Gravity, Urine: 1.011 (ref 1.005–1.030)

## 2017-11-07 LAB — TROPONIN I
TROPONIN I: 0.2 ng/mL — AB (ref <0.03)
TROPONIN I: 0.09 ng/mL — AB (ref <0.03)
Troponin I: 0.08 ng/mL (ref <0.03)

## 2017-11-07 LAB — BRAIN NATRIURETIC PEPTIDE: B Natriuretic Peptide: 219.3 pg/mL — ABNORMAL HIGH (ref 0.0–100.0)

## 2017-11-07 LAB — PROTIME-INR
INR: 1.33
Prothrombin Time: 16.3 seconds — ABNORMAL HIGH (ref 11.4–15.2)

## 2017-11-07 LAB — TSH: TSH: 2.06 u[IU]/mL (ref 0.350–4.500)

## 2017-11-07 MED ORDER — ONDANSETRON HCL 4 MG PO TABS
4.0000 mg | ORAL_TABLET | Freq: Four times a day (QID) | ORAL | Status: DC | PRN
  Administered 2017-11-09: 4 mg via ORAL
  Filled 2017-11-07: qty 1

## 2017-11-07 MED ORDER — ATORVASTATIN CALCIUM 20 MG PO TABS
20.0000 mg | ORAL_TABLET | Freq: Every day | ORAL | Status: DC
  Administered 2017-11-07 – 2017-11-14 (×8): 20 mg via ORAL
  Filled 2017-11-07 (×8): qty 1

## 2017-11-07 MED ORDER — BISACODYL 5 MG PO TBEC: 5.0000 mg | DELAYED_RELEASE_TABLET | Freq: Every day | ORAL | Status: DC | PRN

## 2017-11-07 MED ORDER — METOPROLOL SUCCINATE ER 25 MG PO TB24
25.0000 mg | ORAL_TABLET | Freq: Every day | ORAL | Status: DC
  Administered 2017-11-08 – 2017-11-12 (×6): 25 mg via ORAL
  Filled 2017-11-07 (×7): qty 1

## 2017-11-07 MED ORDER — NITROGLYCERIN 0.4 MG SL SUBL: 0.4000 mg | SUBLINGUAL_TABLET | SUBLINGUAL | Status: DC | PRN

## 2017-11-07 MED ORDER — TRAZODONE HCL 50 MG PO TABS: 25.0000 mg | ORAL_TABLET | Freq: Every evening | ORAL | Status: DC | PRN

## 2017-11-07 MED ORDER — SENNOSIDES-DOCUSATE SODIUM 8.6-50 MG PO TABS
1.0000 | ORAL_TABLET | Freq: Two times a day (BID) | ORAL | Status: DC
  Administered 2017-11-07 – 2017-11-08 (×3): 1 via ORAL
  Filled 2017-11-07 (×3): qty 1

## 2017-11-07 MED ORDER — VITAMIN B 12 100 MCG PO LOZG: LOZENGE | Freq: Every day | ORAL | Status: DC

## 2017-11-07 MED ORDER — ACETAMINOPHEN 500 MG PO TABS
500.0000 mg | ORAL_TABLET | ORAL | Status: DC | PRN
  Administered 2017-11-12: 500 mg via ORAL
  Filled 2017-11-07: qty 1

## 2017-11-07 MED ORDER — PREDNISONE 20 MG PO TABS
40.0000 mg | ORAL_TABLET | Freq: Every day | ORAL | Status: DC
  Administered 2017-11-08 – 2017-11-09 (×2): 40 mg via ORAL
  Filled 2017-11-07 (×2): qty 2

## 2017-11-07 MED ORDER — APIXABAN 2.5 MG PO TABS
2.5000 mg | ORAL_TABLET | Freq: Two times a day (BID) | ORAL | Status: DC
  Filled 2017-11-07: qty 1

## 2017-11-07 MED ORDER — HYDROCODONE-ACETAMINOPHEN 5-325 MG PO TABS
1.0000 | ORAL_TABLET | ORAL | Status: DC | PRN
  Administered 2017-11-07: 2 via ORAL
  Administered 2017-11-07: 1 via ORAL
  Administered 2017-11-08 (×2): 2 via ORAL
  Filled 2017-11-07 (×2): qty 2
  Filled 2017-11-07: qty 1
  Filled 2017-11-07: qty 2

## 2017-11-07 MED ORDER — METHOCARBAMOL 500 MG PO TABS
500.0000 mg | ORAL_TABLET | Freq: Three times a day (TID) | ORAL | Status: DC
  Administered 2017-11-07 (×2): 500 mg via ORAL
  Filled 2017-11-07 (×2): qty 1

## 2017-11-07 MED ORDER — ALBUTEROL SULFATE (2.5 MG/3ML) 0.083% IN NEBU: 2.5000 mg | INHALATION_SOLUTION | Freq: Three times a day (TID) | RESPIRATORY_TRACT | Status: DC | PRN

## 2017-11-07 MED ORDER — GLYCERIN (LAXATIVE) 2.1 G RE SUPP
1.0000 | Freq: Every day | RECTAL | Status: DC | PRN
  Filled 2017-11-07: qty 1

## 2017-11-07 MED ORDER — MOMETASONE FURO-FORMOTEROL FUM 100-5 MCG/ACT IN AERO
2.0000 | INHALATION_SPRAY | Freq: Two times a day (BID) | RESPIRATORY_TRACT | Status: DC
  Administered 2017-11-08 – 2017-11-14 (×13): 2 via RESPIRATORY_TRACT
  Filled 2017-11-07: qty 8.8

## 2017-11-07 MED ORDER — MENTHOL 3 MG MT LOZG
1.0000 | LOZENGE | OROMUCOSAL | Status: DC | PRN
  Filled 2017-11-07: qty 9

## 2017-11-07 MED ORDER — ONDANSETRON HCL 4 MG/2ML IJ SOLN: 4.0000 mg | Freq: Four times a day (QID) | INTRAMUSCULAR | Status: DC | PRN

## 2017-11-07 MED ORDER — LORAZEPAM 2 MG/ML IJ SOLN
0.5000 mg | Freq: Once | INTRAMUSCULAR | Status: AC
  Administered 2017-11-07: 0.5 mg via INTRAVENOUS
  Filled 2017-11-07: qty 1

## 2017-11-07 MED ORDER — SPIRONOLACTONE 12.5 MG HALF TABLET
12.5000 mg | ORAL_TABLET | Freq: Every day | ORAL | Status: DC
  Filled 2017-11-07 (×2): qty 1

## 2017-11-07 MED ORDER — VITAMIN B-12 100 MCG PO TABS
500.0000 ug | ORAL_TABLET | Freq: Every day | ORAL | Status: DC
  Administered 2017-11-08 – 2017-11-14 (×7): 500 ug via ORAL
  Filled 2017-11-07 (×2): qty 5
  Filled 2017-11-07: qty 1
  Filled 2017-11-07 (×5): qty 5

## 2017-11-07 MED ORDER — MORPHINE SULFATE (PF) 4 MG/ML IV SOLN
4.0000 mg | Freq: Once | INTRAVENOUS | Status: AC
  Administered 2017-11-07: 4 mg via INTRAVENOUS
  Filled 2017-11-07: qty 1

## 2017-11-07 MED ORDER — LATANOPROST 0.005 % OP SOLN
1.0000 [drp] | Freq: Every day | OPHTHALMIC | Status: DC
  Administered 2017-11-07 – 2017-11-13 (×7): 1 [drp] via OPHTHALMIC
  Filled 2017-11-07: qty 2.5

## 2017-11-07 MED ORDER — IOPAMIDOL (ISOVUE-M 200) INJECTION 41%
INTRAMUSCULAR | Status: AC
  Filled 2017-11-07: qty 10

## 2017-11-07 MED ORDER — MORPHINE SULFATE (PF) 4 MG/ML IV SOLN
2.0000 mg | Freq: Once | INTRAVENOUS | Status: AC
  Administered 2017-11-07: 2 mg via INTRAVENOUS
  Filled 2017-11-07: qty 1

## 2017-11-07 MED ORDER — MORPHINE SULFATE (PF) 2 MG/ML IV SOLN
2.0000 mg | INTRAVENOUS | Status: AC | PRN
  Administered 2017-11-07 – 2017-11-08 (×3): 2 mg via INTRAVENOUS
  Filled 2017-11-07 (×3): qty 1

## 2017-11-07 MED ORDER — LEVOTHYROXINE SODIUM 25 MCG PO TABS
12.5000 ug | ORAL_TABLET | Freq: Every day | ORAL | Status: DC
  Administered 2017-11-08 – 2017-11-14 (×7): 12.5 ug via ORAL
  Filled 2017-11-07 (×7): qty 1

## 2017-11-07 MED ORDER — METHOCARBAMOL 500 MG PO TABS: 500.0000 mg | ORAL_TABLET | Freq: Three times a day (TID) | ORAL | Status: DC

## 2017-11-07 MED ORDER — BUDESONIDE 0.5 MG/2ML IN SUSP
0.5000 mg | Freq: Every day | RESPIRATORY_TRACT | Status: DC
  Administered 2017-11-11: 0.5 mg via RESPIRATORY_TRACT
  Filled 2017-11-07 (×9): qty 2

## 2017-11-07 NOTE — ED Provider Notes (Signed)
[facility] Provider Note   CSN: [id number] Arrival date & time: 11/07/17  0865     History   Chief Complaint Chief Complaint  Patient presents with  . Back Pain    HPI Ryan [name] is a 84 y.o. male.  84 year old male with prior medical history significant for aortic valve stenosis status post TAVR, 6 cardiomyopathy, A. fib, CHF, chronic low back pain, DJD, OSA, and permanent pacemaker who presents for evaluation of left low back pain.  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  Patient was started on prednisone and spironolactone last week for that issue.  Patient returns today complaining of increasing left low back pain that started about 3 days ago.  Patient has taken several doses of hydrocodone at home without improvement in the symptoms.  Patient reports pain with movement of his back.  He denies numbness or weakness in his lower extremities.  He denies difficulty with his bowel movements.  Patient denies recent fever.  Patient denies recent injury or fall.  Patient was not able to ambulate today secondary to his pain.  The history is provided by the patient and medical records.  Back Pain   This is a recurrent problem. The current episode started more than 2 days ago. The problem occurs constantly. The problem has been gradually worsening. The pain is associated with no known injury. The pain is present in the lumbar spine. The quality of the pain is described as aching and stabbing. The pain does not radiate. The pain is moderate. The symptoms are aggravated by bending and twisting. Pertinent negatives include no chest pain, no fever, no paresthesias, no paresis, no tingling and no weakness.    Past Medical History:  Diagnosis Date  . Age-related macular degeneration   . Allergic rhinitis   . Amiodarone toxicity   . Aortic valve stenosis    a. s/p TAVR 06/2016.  . Arthritis    "hands, lower back"  (04/30/2016)  . Asthma   . Cardiomyopathy ([place])    a. EF declined to 35-40% by echo 09/2017 (previously 50-55% after TAVR).  . Carotid artery obstruction    a. s/p bilat CEA.  . Chronic combined systolic and diastolic CHF (congestive heart failure) ([place])   . Chronic lower back pain   . Coronary arteriosclerosis in native artery    a. 2017 Cardiac catheterization demonstrated worsening CAD with 70% mid LCx, 80% OM1 and 80% mid RCA stenosis. b.  In 11/17, he underwent successful rotational atherectomy and DES to RCA.  . Diverticulitis of colon   . DJD (degenerative joint disease)   . Dyspnea    "constant but the degree changes"  . GERD (gastroesophageal reflux disease)   . History of elevated PSA 05/2009  . History of hiatal hernia   . HLD (hyperlipidemia)   . Hypertension   . Nocturnal hypoxemia   . OSA (obstructive sleep apnea)    intolerant to CPAP  . PAF (paroxysmal atrial fibrillation) ([place])   . Paroxysmal supraventricular tachycardia ([place])   . Pneumonia    "when I was a kid"  . Presence of permanent cardiac pacemaker   . Primary malignant neoplasm of bladder ([place])   . Prostate cancer [facility])     Patient Active Problem List   Diagnosis Date Noted  . Syncope 10/23/2017  . Decreased diffusion capacity 10/23/2017  . 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 Elevated TSH 06/30/2017  . Anticoagulation management encounter 08/19/2016  .  CAD (coronary artery disease), native coronary artery 04/30/2016  . Tachy-brady syndrome ([place])   . Hypertensive heart disease with heart failure ([place])   . Pure hypercholesterolemia   . PAF (paroxysmal atrial fibrillation) ([place]) 03/18/2016  . Chronic combined systolic (congestive) and diastolic (congestive) heart failure ([place]) 03/18/2016  . Atrial fibrillation with RVR ([place]) 03/18/2016  . Atrial fibrillation with rapid ventricular response ([place]) 03/18/2016  . Fatigue 01/17/2016  . DOE (dyspnea on exertion) 01/17/2016  . Coronary artery  disease of native artery of native heart with stable angina pectoris ([place])   . Severe aortic stenosis 10/12/2015    Past Surgical History:  Procedure Laterality Date  . APPENDECTOMY    . CARDIAC CATHETERIZATION N/A 01/17/2016   Procedure: Right/Left Heart Cath and Coronary Angiography;  Surgeon: Belva Crome, MD;  Location: [place] [facility];  Service: Cardiovascular;  Laterality: N/A;  . CARDIAC CATHETERIZATION N/A 04/30/2016   Procedure: Coronary/Graft Atherectomy;  Surgeon: Sherren Mocha, MD;  Location: [place] [facility];  Service: Cardiovascular;  Laterality: N/A;  . CAROTID ENDARTERECTOMY Bilateral   . CATARACT EXTRACTION W/ INTRAOCULAR LENS  IMPLANT, BILATERAL Bilateral   . COLON SURGERY  1981   Meckles Diverticulum with volvulus  . EP IMPLANTABLE DEVICE N/A 03/21/2016   Procedure: Pacemaker Implant;  Surgeon: Evans Lance, MD;  Location: [place] [facility];  Service: Cardiovascular;  Laterality: N/A;  . INGUINAL HERNIA REPAIR Right 2009  . INSERTION PROSTATE RADIATION SEED    . TEE WITHOUT CARDIOVERSION N/A 06/10/2016   Procedure: TRANSESOPHAGEAL ECHOCARDIOGRAM (TEE);  Surgeon: Sherren Mocha, MD;  Location: [place];  Service: Open Heart Surgery;  Laterality: N/A;  . TONSILLECTOMY  ~ 1947  . TRANSCATHETER AORTIC VALVE REPLACEMENT, TRANSFEMORAL N/A 06/10/2016   Procedure: TRANSCATHETER AORTIC VALVE REPLACEMENT, TRANSFEMORAL;  Surgeon: Sherren Mocha, MD;  Location: [place];  Service: Open Heart Surgery;  Laterality: N/A;        Home Medications    Prior to Admission medications   Medication Sig Start Date End Date Taking? Authorizing Provider  acetaminophen (TYLENOL) 500 MG tablet Take 500 mg by mouth as needed for mild pain.     [provider]  albuterol (PROVENTIL) (2.5 MG/3ML) 0.083% nebulizer solution Take 3 mLs (2.5 mg total) by nebulization every 8 (eight) hours as needed for wheezing or shortness of breath. 10/13/17   Nafziger, Tommi Rumps, NP  apixaban (ELIQUIS) 2.5 MG  TABS tablet Take 1 tablet (2.5 mg total) by mouth 2 (two) times daily. 10/22/17   Evans Lance, MD  atorvastatin (LIPITOR) 20 MG tablet Take 1 tablet (20 mg total) by mouth daily. 11/24/16   Dorothy Spark, MD  budesonide (PULMICORT) 0.5 MG/2ML nebulizer solution Take 2 mLs (0.5 mg total) by nebulization every 8 (eight) hours as needed. 11/03/17   Lauraine Rinne, NP  budesonide-formoterol (SYMBICORT) 80-4.5 MCG/ACT inhaler Inhale 2 puffs into the lungs 2 (two) times daily. 10/20/17   Lauraine Rinne, NP  cholecalciferol (VITAMIN D) 1000 units tablet Take 1,000 Units by mouth 2 (two) times a week.     [provider]  Coenzyme Q10 (COQ-10 PO) Take 1 capsule by mouth 2 (two) times a week.     [provider]  Cyanocobalamin (VITAMIN B 12 PO) Take 1 capsule by mouth daily.    [provider]  furosemide (LASIX) 20 MG tablet Take 1 tablet (20 mg total) by mouth daily as needed for edema. 10/22/17 01/20/18  Dorothy Spark, MD  latanoprost (  XALATAN) 0.005 % ophthalmic solution Place 1 drop into both eyes at bedtime.    [provider]  levothyroxine (SYNTHROID, LEVOTHROID) 25 MCG tablet Take 0.5 tablets (12.5 mcg total) by mouth daily before breakfast. 10/21/17   Dorothy Spark, MD  menthol-cetylpyridinium (CEPACOL) 3 MG lozenge Take 1 lozenge (3 mg total) by mouth as needed for sore throat. 10/24/17   Lavina Hamman, MD  metoprolol succinate (TOPROL XL) 25 MG 24 hr tablet Take 1 tablet (25 mg total) by mouth daily. 07/15/17 07/10/18  Dorothy Spark, MD  nitroGLYCERIN (NITROSTAT) 0.4 MG SL tablet Place 1 tablet (0.4 mg total) under the tongue every 5 (five) minutes as needed. 05/01/16   Cheryln Manly, NP  predniSONE (DELTASONE) 10 MG tablet Take 4 tablets (40 mg) daily for the next week, take 3 tablets (30 mg) daily for the following week, then take 2 tablets (20 mg) daily until follow-up appointment 11/03/17   Lauraine Rinne, NP  spironolactone (ALDACTONE) 25 MG  tablet Take 0.5 tablets (12.5 mg total) by mouth daily. 11/05/17   Charlie Pitter, PA-C    Family History Family History  Problem Relation Age of Onset  . Heart disease Mother   . Heart disease Father     Social History Social History   Tobacco Use  . Smoking status: Never Smoker  . Smokeless tobacco: Never Used  Substance Use Topics  . Alcohol use: No    Alcohol/week: 0.0 oz    Comment: 04/30/2016 "nothing in years"  . Drug use: No     Allergies   Ciprofloxacin; Hydrochlorothiazide; Sulfa antibiotics; Ace inhibitors; Losartan; Carvedilol; and Lisinopril   Review of Systems Review of Systems  Constitutional: Negative for fever.  Cardiovascular: Negative for chest pain.  Musculoskeletal: Positive for back pain.  Neurological: Negative for tingling, weakness and paresthesias.  All other systems reviewed and are negative.    Physical Exam Updated Vital Signs BP (!) 141/67   Pulse 64   Temp 97.7 F (36.5 C) (Oral)   Resp 17   Wt 63.5 kg (140 lb)   SpO2 94%   BMI 23.30 kg/m   Physical Exam  Constitutional: He is oriented to person, place, and time. He appears well-developed and well-nourished. No distress.  HENT:  Head: Normocephalic and atraumatic.  Mouth/Throat: Oropharynx is clear and moist.  Eyes: Pupils are equal, round, and reactive to light. Conjunctivae and EOM are normal.  Neck: Normal range of motion. Neck supple.  Cardiovascular: Normal rate, regular rhythm and normal heart sounds.  Pulmonary/Chest: Effort normal and breath sounds normal. No respiratory distress.  Abdominal: Soft. He exhibits no distension. There is no tenderness.  Musculoskeletal: Normal range of motion. He exhibits tenderness. He exhibits no edema or deformity.  Patient with localized tenderness to the left low back.  Patient's pain is reproducible with palpation or attempted movement of the left low back.  Both lower extremities are neurovascular intact with 5 out of 5 strength.    Neurological: He is alert and oriented to person, place, and time.  Skin: Skin is warm and dry.  Psychiatric: He has a normal mood and affect.  Nursing note and vitals reviewed.    ED Treatments / Results  Labs (all labs ordered are listed, but only abnormal results are displayed) Labs Reviewed  COMPREHENSIVE METABOLIC PANEL - Abnormal; Notable for the following components:      Result Value   Calcium 8.7 (*)    Total Protein 6.0 (*)  Albumin 3.0 (*)    AST 48 (*)    GFR calc non Af Amer 54 (*)    All other components within normal limits  CBC WITH DIFFERENTIAL/PLATELET - Abnormal; Notable for the following components:   WBC 11.3 (*)    Hemoglobin 12.3 (*)    MCH 25.6 (*)    RDW 19.4 (*)    Platelets 60 (*)    Neutro Abs 9.3 (*)    Abs Immature Granulocytes 0.3 (*)    All other components within normal limits  TROPONIN I - Abnormal; Notable for the following components:   Troponin I 0.20 (*)    All other components within normal limits  PROTIME-INR - Abnormal; Notable for the following components:   Prothrombin Time 16.3 (*)    All other components within normal limits  BRAIN NATRIURETIC PEPTIDE - Abnormal; Notable for the following components:   B Natriuretic Peptide 219.3 (*)    All other components within normal limits  URINALYSIS, ROUTINE W REFLEX MICROSCOPIC    EKG EKG Interpretation  Date/Time:  Saturday November 07 2017 11:14:20 EDT Ventricular Rate:  64 PR Interval:    QRS Duration: 134 QT Interval:  525 QTC Calculation: 542 R Axis:   18 Text Interpretation:  Sinus rhythm Probable left atrial enlargement IVCD, consider atypical LBBB Confirmed by Dene Gentry [phone]) on 11/07/2017 11:25:43 AM   Radiology Dg Chest 1 View  Result Date: 11/07/2017 CLINICAL DATA:  Respiratory distress EXAM: CHEST  1 VIEW COMPARISON:  Oct 23, 2017 FINDINGS: There is slight scarring in the lower lung zones. There is no edema or consolidation. Heart is mildly enlarged with  pulmonary vascularity normal. Pacemaker leads attached right atrium and right ventricle. There is a prosthetic aortic valve. There is aortic atherosclerosis. No. Bones appear osteoporotic. IMPRESSION: Slight scarring in the lower lung zones. No edema or consolidation. Stable cardiac prominence. Pacemaker leads attached to right atrium right ventricle. Aortic valve replacement present. There is aortic atherosclerosis. Aortic Atherosclerosis (ICD10-I70.0). Electronically Signed   By: Lowella Grip III M.D.   On: 11/07/2017 09:35   Dg Lumbar Spine Complete  Result Date: 11/07/2017 CLINICAL DATA:  Low back pain.  Worsening for 3 days. EXAM: LUMBAR SPINE - COMPLETE 4+ VIEW COMPARISON:  10/14/2017 FINDINGS: Again noted is some levoscoliosis in the lumbar spine. The curvature is similar to the comparison examination. Again noted is significant anterolisthesis at L5-S1 measuring roughly 1.8 cm and unchanged. This is compatible with grade 2 or grade 3 spondylolisthesis. Chronic disc space narrowing and endplate changes at U0-A5 and L2-L3. Mild retrolisthesis at L2-L3. The vertebral body heights are maintained. Limited evaluation for pars defects but suspect pars defects at L5 associated with the anterolisthesis. Large amount of stool in the abdomen. IMPRESSION: Stable appearance of the lumbar spine without acute abnormality. Scoliosis with multilevel degenerative disease and significant anterolisthesis at L5-S1. Large stool burden. Electronically Signed   By: Markus Daft M.D.   On: 11/07/2017 09:38    Procedures Procedures (including critical care time)  Medications Ordered in ED Medications  morphine 4 MG/ML injection 2 mg (2 mg Intravenous Given 11/07/17 0854)  morphine 4 MG/ML injection 4 mg (4 mg Intravenous Given 11/07/17 0949)  LORazepam (ATIVAN) injection 0.5 mg (0.5 mg Intravenous Given 11/07/17 1128)     Initial Impression / Assessment and Plan / ED Course  I have reviewed the triage vital signs and  the nursing notes.  Pertinent labs & imaging results that were available during my care  of the patient were reviewed by me and considered in my medical decision making (see chart for details).       MDM  Screen complete  Patient is presenting for evaluation of left low back pain.  Patient's history and exam are consistent with likely muscular spasm.  Patient is not significantly improved following multiple doses of narcotics and benzos in the ED.  Plain films do not suggest acute trauma or fracture.  Baseline labs do not suggest other acute pathology.  Of note, patient's troponin is mildly elevated at 0.2.  This is in the setting of an unchanged EKG with no active chest pain or shortness of breath.  Patient had an echo performed during his last admission last week.  Case was discussed with the hospitalist service to evaluate the patient for admission.  Patient understands the plan of care and need for admission.  Final Clinical Impressions(s) / ED Diagnoses   Final diagnoses:  Acute low back pain without sciatica, unspecified back pain laterality    ED Discharge Orders    None       Valarie Merino, MD 11/07/17 1151

## 2017-11-07 NOTE — ED Notes (Signed)
Patient transported to X-ray 

## 2017-11-07 NOTE — ED Notes (Signed)
Date and time results received: 11/07/17 1100  Test: Troponin Critical Value: 0.20  Name of Provider Notified: Messick MD  At bedside

## 2017-11-07 NOTE — H&P (Addendum)
History and Physical    PERETZ THIEME BSW:[id number] DOB: 05/05/1930 DOA: 11/07/2017  PCP: Dorothyann Peng, NP Patient coming from: home  Chief Complaint: back pain  HPI: Ryan [name] is a very pleasant 84 y.o. male with medical history significant for aortic valve stenosis status post TAVR, cardiomyopathy, A. Fib, CHF, chronic low back pain,carotid artery disease status post bilateral CEA, CAD status postPTCA 2017, hypertension since emergency Department chief complaint persistentback pain. [facility] are asked to admit  Information is obtained from the patient the chart and his wife and daughter who at the bedside. 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 He was started on prednisone and spironolactone. He was discharged home and doing better. He states 3 days ago he developed agile worsening left low back pain.  She states he took several doses of hydrocodone without improvement. He describes the pain as sharp intermittent with spasms. He states that movement makes the pain worse. He denies any recent fall or injury. He denies numbness tingling of his extremities. He does state that straightening his legs increases the pain as well. He states typically he is up during the night 3 or 4 times to go to the bathroom but now since he can't stand he is having difficulty emptying his bladder.    ED Course: in the emergency department he is provided with Ativan and morphine as well as IV fluids. He is afebrile hemodynamically stable and not hypoxic.  Review of Systems: As per HPI otherwise all other systems reviewed and are negative.   Ambulatory Status: he relates independently is independent with ADLs  Past Medical History:  Diagnosis Date  . Age-related macular degeneration   . Allergic rhinitis   . Amiodarone toxicity   . Aortic valve stenosis    a. s/p TAVR 06/2016.  . Arthritis    "hands, lower back" (04/30/2016)  . Asthma     . Cardiomyopathy ([place])    a. EF declined to 35-40% by echo 09/2017 (previously 50-55% after TAVR).  . Carotid artery obstruction    a. s/p bilat CEA.  . Chronic combined systolic and diastolic CHF (congestive heart failure) ([place])   . Chronic lower back pain   . Coronary arteriosclerosis in native artery    a. 2017 Cardiac catheterization demonstrated worsening CAD with 70% mid LCx, 80% OM1 and 80% mid RCA stenosis. b.  In 11/17, he underwent successful rotational atherectomy and DES to RCA.  . Diverticulitis of colon   . DJD (degenerative joint disease)   . Dyspnea    "constant but the degree changes"  . GERD (gastroesophageal reflux disease)   . History of elevated PSA 05/2009  . History of hiatal hernia   . HLD (hyperlipidemia)   . Hypertension   . Nocturnal hypoxemia   . OSA (obstructive sleep apnea)    intolerant to CPAP  . PAF (paroxysmal atrial fibrillation) ([place])   . Paroxysmal supraventricular tachycardia ([place])   . Pneumonia    "when I was a kid"  . Presence of permanent cardiac pacemaker   . Primary malignant neoplasm of bladder ([place])   . Prostate cancer [facility])     Past Surgical History:  Procedure Laterality Date  . APPENDECTOMY    . CARDIAC CATHETERIZATION N/A 01/17/2016   Procedure: Right/Left Heart Cath and Coronary Angiography;  Surgeon: Belva Crome, MD;  Location: [place] [facility];  Service: Cardiovascular;  Laterality: N/A;  . CARDIAC CATHETERIZATION N/A 04/30/2016   Procedure: Coronary/Graft Atherectomy;  Surgeon: Sherren Mocha, MD;  Location: [place] [facility];  Service: Cardiovascular;  Laterality: N/A;  . CAROTID ENDARTERECTOMY Bilateral   . CATARACT EXTRACTION W/ INTRAOCULAR LENS  IMPLANT, BILATERAL Bilateral   . COLON SURGERY  1981   Meckles Diverticulum with volvulus  . EP IMPLANTABLE DEVICE N/A 03/21/2016   Procedure: Pacemaker Implant;  Surgeon: Evans Lance, MD;  Location: [place] [facility];  Service: Cardiovascular;  Laterality: N/A;  .  INGUINAL HERNIA REPAIR Right 2009  . INSERTION PROSTATE RADIATION SEED    . TEE WITHOUT CARDIOVERSION N/A 06/10/2016   Procedure: TRANSESOPHAGEAL ECHOCARDIOGRAM (TEE);  Surgeon: Sherren Mocha, MD;  Location: [place];  Service: Open Heart Surgery;  Laterality: N/A;  . TONSILLECTOMY  ~ 1947  . TRANSCATHETER AORTIC VALVE REPLACEMENT, TRANSFEMORAL N/A 06/10/2016   Procedure: TRANSCATHETER AORTIC VALVE REPLACEMENT, TRANSFEMORAL;  Surgeon: Sherren Mocha, MD;  Location: [place];  Service: Open Heart Surgery;  Laterality: N/A;    Social History   Socioeconomic History  . Marital status: Married    Spouse name: Not on file  . Number of children: Not on file  . Years of education: Not on file  . Highest education level: Not on file  Occupational History  . Occupation: retired  Scientific laboratory technician  . Financial resource strain: Not on file  . Food insecurity:    Worry: Not on file    Inability: Not on file  . Transportation needs:    Medical: Not on file    Non-medical: Not on file  Tobacco Use  . Smoking status: Never Smoker  . Smokeless tobacco: Never Used  Substance and Sexual Activity  . Alcohol use: No    Alcohol/week: 0.0 oz    Comment: 04/30/2016 "nothing in years"  . Drug use: No  . Sexual activity: Never  Lifestyle  . Physical activity:    Days per week: Not on file    Minutes per session: Not on file  . Stress: Not on file  Relationships  . Social connections:    Talks on phone: Not on file    Gets together: Not on file    Attends religious service: Not on file    Active member of club or organization: Not on file    Attends meetings of clubs or organizations: Not on file    Relationship status: Not on file  . Intimate partner violence:    Fear of current or ex partner: Not on file    Emotionally abused: Not on file    Physically abused: Not on file    Forced sexual activity: Not on file  Other Topics Concern  . Not on file  Social History Narrative   Retired    Three  children - One in [place], One in [place], and one in [place].        Allergies  Allergen Reactions  . Ciprofloxacin Anaphylaxis  . Hydrochlorothiazide Anaphylaxis  . Sulfa Antibiotics Anaphylaxis  . Ace Inhibitors Cough  . Losartan Cough  . Carvedilol Cough    Pt states it "causes him too cough."  . Lisinopril Cough    Pt reports worsening cough and "feeling wheezy."    Family History  Problem Relation Age of Onset  . Heart disease Mother   . Heart disease Father     Prior to Admission medications   Medication Sig Start Date End Date Taking? Authorizing Provider  acetaminophen (TYLENOL) 500 MG tablet Take 500 mg by mouth as needed for mild pain.  Yes [provider]  acetaminophen-codeine (TYLENOL #3) 300-30 MG tablet Take 1 tablet by mouth at bedtime.   Yes [provider]  albuterol (PROVENTIL) (2.5 MG/3ML) 0.083% nebulizer solution Take 3 mLs (2.5 mg total) by nebulization every 8 (eight) hours as needed for wheezing or shortness of breath. 10/13/17  Yes Nafziger, Tommi Rumps, NP  apixaban (ELIQUIS) 2.5 MG TABS tablet Take 1 tablet (2.5 mg total) by mouth 2 (two) times daily. 10/22/17  Yes Evans Lance, MD  atorvastatin (LIPITOR) 20 MG tablet Take 1 tablet (20 mg total) by mouth daily. 11/24/16  Yes Dorothy Spark, MD  budesonide-formoterol [facility]) 80-4.5 MCG/ACT inhaler Inhale 2 puffs into the lungs 2 (two) times daily. 10/20/17  Yes Lauraine Rinne, NP  cholecalciferol (VITAMIN D) 1000 units tablet Take 1,000 Units by mouth 2 (two) times a week.    Yes [provider]  Coenzyme Q10 (COQ-10 PO) Take 1 capsule by mouth 2 (two) times a week.    Yes [provider]  Cyanocobalamin (VITAMIN B 12 PO) Take 1 capsule by mouth daily.   Yes [provider]  furosemide (LASIX) 20 MG tablet Take 1 tablet (20 mg total) by mouth daily as needed for edema. 10/22/17 01/20/18 Yes Dorothy Spark, MD  latanoprost (XALATAN) 0.005 % ophthalmic solution  Place 1 drop into both eyes at bedtime.   Yes [provider]  levothyroxine (SYNTHROID, LEVOTHROID) 25 MCG tablet Take 0.5 tablets (12.5 mcg total) by mouth daily before breakfast. 10/21/17  Yes Dorothy Spark, MD  menthol-cetylpyridinium (CEPACOL) 3 MG lozenge Take 1 lozenge (3 mg total) by mouth as needed for sore throat. 10/24/17  Yes Lavina Hamman, MD  metoprolol succinate (TOPROL XL) 25 MG 24 hr tablet Take 1 tablet (25 mg total) by mouth daily. 07/15/17 07/10/18 Yes Dorothy Spark, MD  Netarsudil Dimesylate (RHOPRESSA) 0.02 % SOLN Place 1 drop into both eyes at bedtime. Sample given by the physician   Yes [provider]  nitroGLYCERIN (NITROSTAT) 0.4 MG SL tablet Place 1 tablet (0.4 mg total) under the tongue every 5 (five) minutes as needed. 05/01/16  Yes Cheryln Manly, NP  predniSONE (DELTASONE) 10 MG tablet Take 4 tablets (40 mg) daily for the next week, take 3 tablets (30 mg) daily for the following week, then take 2 tablets (20 mg) daily until follow-up appointment Patient taking differently: Take 40 mg by mouth daily with breakfast. Take 4 tablets (40 mg) daily for the next week, take 3 tablets (30 mg) daily for the following week, then take 2 tablets (20 mg) daily until follow-up appointment 11/03/17  Yes Lauraine Rinne, NP  spironolactone (ALDACTONE) 25 MG tablet Take 0.5 tablets (12.5 mg total) by mouth daily. 11/05/17  Yes Charlie Pitter, PA-C    Physical Exam: Vitals:   11/07/17 1115 11/07/17 1130 11/07/17 1145 11/07/17 1200  BP: (!) 141/67 137/66  125/64  Pulse: 64 62 61 (!) 59  Resp: 17 14 13 18   Temp:      TempSrc:      SpO2: 94% 93% 91% 92%  Weight:         General:  Somewhat thin and frail appearing mild to moderate distress Eyes:  PERRL, EOMI, normal lids, iris ENT:  grossly normal hearing, lips & tongue, mucous membranes of his mouth are slightly pale somewhat dry Neck:  no LAD, masses or thyromegaly Cardiovascular:  RRR, +murmur. Trace LE  edema.  Respiratory:  CTA bilaterally, no  w/r/r. Normal respiratory effort. Abdomen:  soft, ntnd, positive bowel sounds throughout no guarding or rebounding Skin:  no rash or induration seen on limited exam Musculoskeletal:  grossly normal tone BUE/BLE, good ROM, no bony abnormality unable to perform straight leg left. Spine tender to palpation particularly on the left Psychiatric:  grossly normal mood and affect, speech fluent and appropriate, AOx3 Neurologic:  CN 2-12 grossly intact, moves all extremities in coordinated fashion, sensation intact speech clear facial symmetry  Labs on Admission: I have personally reviewed following labs and imaging studies  CBC: Recent Labs  Lab 11/07/17 0849  WBC 11.3*  NEUTROABS 9.3*  HGB 12.3*  HCT 39.5  MCV 82.3  PLT 60*   Basic Metabolic Panel: Recent Labs  Lab 11/07/17 0849  NA 141  K 4.0  CL 107  CO2 25  GLUCOSE 97  BUN 20  CREATININE 1.18  CALCIUM 8.7*   GFR: Estimated Creatinine Clearance: 39.8 mL/min (by C-G formula based on SCr of 1.18 mg/dL). Liver Function Tests: Recent Labs  Lab 11/07/17 0849  AST 48*  ALT 49  ALKPHOS 90  BILITOT 0.9  PROT 6.0*  ALBUMIN 3.0*   No results for input(s): LIPASE, AMYLASE in the last 168 hours. No results for input(s): AMMONIA in the last 168 hours. Coagulation Profile: Recent Labs  Lab 11/07/17 0849  INR 1.33   Cardiac Enzymes: Recent Labs  Lab 11/07/17 0849  TROPONINI 0.20*   BNP (last 3 results) Recent Labs    10/20/17 1058  PROBNP 146.0*   HbA1C: No results for input(s): HGBA1C in the last 72 hours. CBG: No results for input(s): GLUCAP in the last 168 hours. Lipid Profile: No results for input(s): CHOL, HDL, LDLCALC, TRIG, CHOLHDL, LDLDIRECT in the last 72 hours. Thyroid Function Tests: No results for input(s): TSH, T4TOTAL, FREET4, T3FREE, THYROIDAB in the last 72 hours. Anemia Panel: No results for input(s): VITAMINB12, FOLATE, FERRITIN, TIBC, IRON, RETICCTPCT  in the last 72 hours. Urine analysis:    Component Value Date/Time   COLORURINE YELLOW 10/23/2017 1450   APPEARANCEUR CLEAR 10/23/2017 1450   LABSPEC 1.008 10/23/2017 1450   PHURINE 6.0 10/23/2017 1450   GLUCOSEU NEGATIVE 10/23/2017 1450   HGBUR NEGATIVE 10/23/2017 1450   BILIRUBINUR NEGATIVE 10/23/2017 1450   KETONESUR NEGATIVE 10/23/2017 1450   PROTEINUR NEGATIVE 10/23/2017 1450   NITRITE NEGATIVE 10/23/2017 1450   LEUKOCYTESUR NEGATIVE 10/23/2017 1450    Creatinine Clearance: Estimated Creatinine Clearance: 39.8 mL/min (by C-G formula based on SCr of 1.18 mg/dL).  Sepsis Labs: @LABRCNTIP (procalcitonin:4,lacticidven:4) )No results found for this or any previous visit (from the past 240 hour(s)).   Radiological Exams on Admission: Dg Chest 1 View  Result Date: 11/07/2017 CLINICAL DATA:  Respiratory distress EXAM: CHEST  1 VIEW COMPARISON:  Oct 23, 2017 FINDINGS: There is slight scarring in the lower lung zones. There is no edema or consolidation. Heart is mildly enlarged with pulmonary vascularity normal. Pacemaker leads attached right atrium and right ventricle. There is a prosthetic aortic valve. There is aortic atherosclerosis. No. Bones appear osteoporotic. IMPRESSION: Slight scarring in the lower lung zones. No edema or consolidation. Stable cardiac prominence. Pacemaker leads attached to right atrium right ventricle. Aortic valve replacement present. There is aortic atherosclerosis. Aortic Atherosclerosis (ICD10-I70.0). Electronically Signed   By: Lowella Grip III M.D.   On: 11/07/2017 09:35   Dg Lumbar Spine Complete  Result Date: 11/07/2017 CLINICAL DATA:  Low back pain.  Worsening for 3 days. EXAM: LUMBAR SPINE - COMPLETE 4+  VIEW COMPARISON:  10/14/2017 FINDINGS: Again noted is some levoscoliosis in the lumbar spine. The curvature is similar to the comparison examination. Again noted is significant anterolisthesis at L5-S1 measuring roughly 1.8 cm and unchanged. This is  compatible with grade 2 or grade 3 spondylolisthesis. Chronic disc space narrowing and endplate changes at R9-F6 and L2-L3. Mild retrolisthesis at L2-L3. The vertebral body heights are maintained. Limited evaluation for pars defects but suspect pars defects at L5 associated with the anterolisthesis. Large amount of stool in the abdomen. IMPRESSION: Stable appearance of the lumbar spine without acute abnormality. Scoliosis with multilevel degenerative disease and significant anterolisthesis at L5-S1. Large stool burden. Electronically Signed   By: Markus Daft M.D.   On: 11/07/2017 09:38    EKG: Independently reviewed. Sinus rhythm Probable left atrial enlargement IVCD,   Assessment/Plan Principal Problem:   Intractable back pain Active Problems:   Elevated troponin   Urinary retention   Severe aortic stenosis   PAF (paroxysmal atrial fibrillation) ([facility])   Chronic combined systolic (congestive) and diastolic (congestive) heart failure ([facility])   CAD (coronary artery disease), native coronary artery   Amiodarone pulmonary toxicity   Thrombocytopenia ([place])   #1. Intractable back pain. Etiology unclear. Patient has had mild to moderate chronic back pain for many years but nevertheless debilitating. X-ray of lumbar spine and reveals stable appearance of lumbar spine without acute abnormality, scoliosis with multilevel degenerative disease and significant anterolisthesis at L5 and S1. Patient unable to bear weight. Started on prednisone 4 days ago for amiodarone toxicity. Unable to MRI due to pacemaker. -admit -continue prednisone -scheduled robaxin -narcotic for pain -mylegram for Monday per neurosurgery -physical therapy  #2. Elevated troponin. No chest pain. EKG without acute changes. Chest x-ray without acute changes. Chart review indicates recent office visit with cardiology. Note indicates CAD stable -cycle troponin -serial ekg -continue home meds including eliquis. -OP follow up  3.  Chronic combined CHF with cardiomyopathy. Recent echo with an EF of 35%. Chart review indicates recent visit with cardiology. That note indicates patient has not had any angina and opined invasive evaluation of heart failure and aortic stenosis would not change management or overall quality of his life. Plan to focus on medical therapy. Amiodarone was stopped due to low EF spironolactone was added. Note also indicates avoiding imdur due to history of headaches. -Continue home meds,  -Monitor intake and output -Obtain daily weights -See above  #4. Paroxysmal A. Fib with amiodarone toxicity. Chart review indicates patient was not a candidate for many of the antiarrhythmic medications do to know and CAD. Amiodarone recently stopped toxicity. Home meds include eliquis.  -Continue home meds including prednisone at 40 mg until 5/12 when dose tappered to 30mg  for 7 days -has appointment with pulm in 2 weeks for follow up -holding eliquis for above -Monitor  #5. Urinary retention. Patient denies history of same but does report history of frequency. States that now given his inability to stand is having trouble emptying his bladder. Bedside bladder scan reveals greater than 500 mils.  -in and out cath -follow urinalysis -renal US  #6. Thrombocytopenia. Platelet 60. No hx same. Was started on prednisone recently. No s/sx bleeding -fobt -inr -monitor   DVT prophylaxis: eliquis  Code Status: full  Family Communication: wife and daughter at bedside  Disposition Plan: home  Consults called: none  Admission status: obs    Dyanne Carrel M MD [facility]  If 7PM-7AM, please contact night-coverage [email or web address] Password [facility]  11/07/2017, 12:38 PM

## 2017-11-07 NOTE — ED Notes (Signed)
Patient transported to Ultrasound 

## 2017-11-07 NOTE — ED Triage Notes (Signed)
pt in from home via [facility] with c/o chronic back pain, worsened x 3 days. Here 2 wks ago with resp distress d/t Amio toxicity per [facility]. Started on Prednisone 3 days ago, states pain is low midline back. Denies numbness/tingling in lower extremities or flank pain. Wheelchair bound at home. A&ox4, on [facility]

## 2017-11-07 NOTE — Progress Notes (Signed)
Received call from Dr Erlinda Hong regarding patient 84 year old with acute intractable back pain. No radicular symptoms. There is a concern about urinary retention based on bladder scan although history is unclear. Only imaging thus far is lumbar xray which shows degenerative changes and anterolisthesis L5 on S1. Unable to have MRI due to pacemaker so I rec Ct Myelogram lumbar  Unfortunately, patient is on Eliquis for Afib and his platelet count is 60. There is an increased risk of bleeding with the CT myelo due to combination. Therefore, because he is neuro intact otherwise, would hold on emergent CT myelo until Eliquis has been held x48 hours. Proceed Monday unless his neuro exam should change. Could obtain plain CT L spine with flex/ex Xrays in interim.

## 2017-11-07 NOTE — ED Notes (Signed)
Pt aware of need for urine, bladder scan 528mL in bladdder

## 2017-11-08 ENCOUNTER — Observation Stay (HOSPITAL_COMMUNITY): Payer: Medicare Other

## 2017-11-08 DIAGNOSIS — D509 Iron deficiency anemia, unspecified: Secondary | ICD-10-CM | POA: Diagnosis not present

## 2017-11-08 DIAGNOSIS — T826XXA Infection and inflammatory reaction due to cardiac valve prosthesis, initial encounter: Secondary | ICD-10-CM | POA: Diagnosis not present

## 2017-11-08 DIAGNOSIS — I482 Chronic atrial fibrillation: Secondary | ICD-10-CM | POA: Diagnosis not present

## 2017-11-08 DIAGNOSIS — M549 Dorsalgia, unspecified: Secondary | ICD-10-CM | POA: Diagnosis not present

## 2017-11-08 DIAGNOSIS — R140 Abdominal distension (gaseous): Secondary | ICD-10-CM | POA: Diagnosis not present

## 2017-11-08 DIAGNOSIS — I330 Acute and subacute infective endocarditis: Secondary | ICD-10-CM | POA: Diagnosis not present

## 2017-11-08 DIAGNOSIS — Z950 Presence of cardiac pacemaker: Secondary | ICD-10-CM

## 2017-11-08 DIAGNOSIS — B952 Enterococcus as the cause of diseases classified elsewhere: Secondary | ICD-10-CM | POA: Diagnosis not present

## 2017-11-08 DIAGNOSIS — D696 Thrombocytopenia, unspecified: Secondary | ICD-10-CM | POA: Diagnosis not present

## 2017-11-08 DIAGNOSIS — M5137 Other intervertebral disc degeneration, lumbosacral region: Secondary | ICD-10-CM | POA: Diagnosis not present

## 2017-11-08 DIAGNOSIS — Z952 Presence of prosthetic heart valve: Secondary | ICD-10-CM

## 2017-11-08 DIAGNOSIS — Z7901 Long term (current) use of anticoagulants: Secondary | ICD-10-CM

## 2017-11-08 DIAGNOSIS — A4181 Sepsis due to Enterococcus: Secondary | ICD-10-CM | POA: Diagnosis not present

## 2017-11-08 DIAGNOSIS — M4317 Spondylolisthesis, lumbosacral region: Secondary | ICD-10-CM | POA: Diagnosis not present

## 2017-11-08 DIAGNOSIS — R7881 Bacteremia: Secondary | ICD-10-CM | POA: Diagnosis not present

## 2017-11-08 DIAGNOSIS — I5042 Chronic combined systolic (congestive) and diastolic (congestive) heart failure: Secondary | ICD-10-CM | POA: Diagnosis not present

## 2017-11-08 LAB — D-DIMER, QUANTITATIVE: D-Dimer, Quant: 2.65 ug/mL-FEU — ABNORMAL HIGH (ref 0.00–0.50)

## 2017-11-08 LAB — FIBRINOGEN: FIBRINOGEN: 564 mg/dL — AB (ref 210–475)

## 2017-11-08 LAB — CBC WITH DIFFERENTIAL/PLATELET
Abs Immature Granulocytes: 0.3 10*3/uL — ABNORMAL HIGH (ref 0.0–0.1)
Basophils Absolute: 0 10*3/uL (ref 0.0–0.1)
Basophils Relative: 0 %
EOS ABS: 0 10*3/uL (ref 0.0–0.7)
Eosinophils Relative: 0 %
HEMATOCRIT: 38.4 % — AB (ref 39.0–52.0)
Hemoglobin: 12.1 g/dL — ABNORMAL LOW (ref 13.0–17.0)
IMMATURE GRANULOCYTES: 2 %
LYMPHS ABS: 0.4 10*3/uL — AB (ref 0.7–4.0)
Lymphocytes Relative: 4 %
MCH: 25.6 pg — AB (ref 26.0–34.0)
MCHC: 31.5 g/dL (ref 30.0–36.0)
MCV: 81.2 fL (ref 78.0–100.0)
MONO ABS: 0.8 10*3/uL (ref 0.1–1.0)
MONOS PCT: 7 %
NEUTROS PCT: 87 %
Neutro Abs: 9.9 10*3/uL — ABNORMAL HIGH (ref 1.7–7.7)
Platelets: 51 10*3/uL — ABNORMAL LOW (ref 150–400)
RBC: 4.73 MIL/uL (ref 4.22–5.81)
RDW: 19.3 % — AB (ref 11.5–15.5)
WBC: 11.5 10*3/uL — ABNORMAL HIGH (ref 4.0–10.5)

## 2017-11-08 LAB — PROTIME-INR
INR: 1.3
PROTHROMBIN TIME: 16.1 s — AB (ref 11.4–15.2)

## 2017-11-08 LAB — CBC
HCT: 37 % — ABNORMAL LOW (ref 39.0–52.0)
HEMOGLOBIN: 11.7 g/dL — AB (ref 13.0–17.0)
MCH: 25.8 pg — ABNORMAL LOW (ref 26.0–34.0)
MCHC: 31.6 g/dL (ref 30.0–36.0)
MCV: 81.7 fL (ref 78.0–100.0)
PLATELETS: 50 10*3/uL — AB (ref 150–400)
RBC: 4.53 MIL/uL (ref 4.22–5.81)
RDW: 19.5 % — ABNORMAL HIGH (ref 11.5–15.5)
WBC: 11.1 10*3/uL — AB (ref 4.0–10.5)

## 2017-11-08 LAB — BASIC METABOLIC PANEL
ANION GAP: 9 (ref 5–15)
BUN: 17 mg/dL (ref 6–20)
CHLORIDE: 103 mmol/L (ref 101–111)
CO2: 27 mmol/L (ref 22–32)
CREATININE: 1.03 mg/dL (ref 0.61–1.24)
Calcium: 8.7 mg/dL — ABNORMAL LOW (ref 8.9–10.3)
GFR calc non Af Amer: >60 mL/min (ref >60)
Glucose, Bld: 98 mg/dL (ref 65–99)
Potassium: 4.1 mmol/L (ref 3.5–5.1)
SODIUM: 139 mmol/L (ref 135–145)

## 2017-11-08 LAB — TYPE AND SCREEN
ABO/RH(D): O POS
ANTIBODY SCREEN: NEGATIVE

## 2017-11-08 LAB — LACTIC ACID, PLASMA: Lactic Acid, Venous: 1.9 mmol/L (ref 0.5–1.9)

## 2017-11-08 LAB — TROPONIN I: Troponin I: 0.08 ng/mL (ref <0.03)

## 2017-11-08 LAB — APTT: aPTT: 31 seconds (ref 24–36)

## 2017-11-08 LAB — SAVE SMEAR

## 2017-11-08 MED ORDER — OXYCODONE-ACETAMINOPHEN 5-325 MG PO TABS
1.0000 | ORAL_TABLET | Freq: Four times a day (QID) | ORAL | Status: DC | PRN
  Administered 2017-11-08 – 2017-11-10 (×3): 1 via ORAL
  Filled 2017-11-08 (×3): qty 1

## 2017-11-08 MED ORDER — POLYETHYLENE GLYCOL 3350 17 G PO PACK
17.0000 g | PACK | Freq: Two times a day (BID) | ORAL | Status: DC
  Administered 2017-11-08 – 2017-11-09 (×4): 17 g via ORAL
  Filled 2017-11-08 (×6): qty 1

## 2017-11-08 MED ORDER — OXYCODONE-ACETAMINOPHEN 5-325 MG PO TABS
1.0000 | ORAL_TABLET | Freq: Four times a day (QID) | ORAL | Status: DC | PRN
  Administered 2017-11-08: 2 via ORAL
  Filled 2017-11-08: qty 2

## 2017-11-08 MED ORDER — LIDOCAINE 5 % EX PTCH
1.0000 | MEDICATED_PATCH | CUTANEOUS | Status: DC
  Administered 2017-11-09 – 2017-11-14 (×6): 1 via TRANSDERMAL
  Filled 2017-11-08 (×6): qty 1

## 2017-11-08 MED ORDER — CYCLOBENZAPRINE HCL 5 MG PO TABS
5.0000 mg | ORAL_TABLET | Freq: Three times a day (TID) | ORAL | Status: DC | PRN
  Administered 2017-11-08 – 2017-11-10 (×3): 5 mg via ORAL
  Filled 2017-11-08 (×3): qty 1

## 2017-11-08 MED ORDER — BISACODYL 10 MG RE SUPP: 10.0000 mg | Freq: Every day | RECTAL | Status: DC | PRN

## 2017-11-08 MED ORDER — LIP MEDEX EX OINT
TOPICAL_OINTMENT | CUTANEOUS | Status: DC | PRN
  Filled 2017-11-08: qty 7

## 2017-11-08 MED ORDER — ENSURE ENLIVE PO LIQD
237.0000 mL | Freq: Two times a day (BID) | ORAL | Status: DC
  Administered 2017-11-08 – 2017-11-14 (×10): 237 mL via ORAL

## 2017-11-08 MED ORDER — FAMOTIDINE 20 MG PO TABS
20.0000 mg | ORAL_TABLET | Freq: Every day | ORAL | Status: DC
  Administered 2017-11-08 – 2017-11-14 (×7): 20 mg via ORAL
  Filled 2017-11-08 (×7): qty 1

## 2017-11-08 MED ORDER — HYDROMORPHONE HCL 1 MG/ML IJ SOLN
0.5000 mg | INTRAMUSCULAR | Status: DC | PRN
  Administered 2017-11-08 – 2017-11-09 (×3): 0.5 mg via INTRAVENOUS
  Filled 2017-11-08 (×3): qty 1

## 2017-11-08 NOTE — Progress Notes (Signed)
Received call from central telemetry that patient heart rate was up to 140s and rhythm was converting to afib. Upon assessing patient, patient had been assisted to bedside commode to have bowel movement. Upon return to bed heart rate resumed 62. Will continue to monitor. Bartholomew Crews, RN

## 2017-11-08 NOTE — Consult Note (Signed)
Chief Complaint   Chief Complaint  Patient presents with  . Back Pain    HPI   HPI: Ryan [name] is a 84 y.o. male who was admitted for intractable back pain. Reports long standing history of chronic low back pain. Has been treating conservatively. Starting Thursday, he developed excruciating left lower back pain. Pain non-radiating, no radicular symptoms. Pain is so severe, that he is unable to get out of bed so he was brought to ER. He was subsequently admitted for pain control.  Continues to endorse low back pain that fluctuates in severity from 8-10/10. Pain does not radiate. Had one episode of ?urinary incontinence, but has since been able to urinate in urinal without issues.   Of note, he is on Eliquis for Afib and has thrombocytopenia (60). Wears O2 chronically. At baseline, ambulates minimally (several steps at a time).  Patient Active Problem List   Diagnosis Date Noted  . Intractable back pain 11/07/2017  . Elevated troponin 11/07/2017  . Urinary retention 11/07/2017  . Thrombocytopenia ([place]) 11/07/2017  . Hypertension   . Syncope 10/23/2017  . Decreased diffusion capacity 10/23/2017  . 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 Elevated TSH 06/30/2017  . Anticoagulation management encounter 08/19/2016  . CAD (coronary artery disease), native coronary artery 04/30/2016  . Tachy-brady syndrome ([place])   . Hypertensive heart disease with heart failure ([place])   . Pure hypercholesterolemia   . PAF (paroxysmal atrial fibrillation) ([place]) 03/18/2016  . Chronic combined systolic (congestive) and diastolic (congestive) heart failure ([place]) 03/18/2016  . Atrial fibrillation with RVR ([place]) 03/18/2016  . Atrial fibrillation with rapid ventricular response ([place]) 03/18/2016  . Fatigue 01/17/2016  . DOE (dyspnea on exertion) 01/17/2016  . Coronary artery disease of native artery of native heart with stable angina pectoris ([place])   . Severe aortic stenosis 10/12/2015    PMH: Past  Medical History:  Diagnosis Date  . Age-related macular degeneration   . Allergic rhinitis   . Amiodarone toxicity   . Aortic valve stenosis    a. s/p TAVR 06/2016.  . Arthritis    "hands, lower back" (04/30/2016)  . Asthma   . Cardiomyopathy ([place])    a. EF declined to 35-40% by echo 09/2017 (previously 50-55% after TAVR).  . Carotid artery obstruction    a. s/p bilat CEA.  . Chronic combined systolic and diastolic CHF (congestive heart failure) ([place])   . Chronic lower back pain   . Coronary arteriosclerosis in native artery    a. 2017 Cardiac catheterization demonstrated worsening CAD with 70% mid LCx, 80% OM1 and 80% mid RCA stenosis. b.  In 11/17, he underwent successful rotational atherectomy and DES to RCA.  . Diverticulitis of colon   . DJD (degenerative joint disease)   . Dyspnea    "constant but the degree changes"  . GERD (gastroesophageal reflux disease)   . History of elevated PSA 05/2009  . History of hiatal hernia   . HLD (hyperlipidemia)   . Hypertension   . Nocturnal hypoxemia   . OSA (obstructive sleep apnea)    intolerant to CPAP  . PAF (paroxysmal atrial fibrillation) ([place])   . Paroxysmal supraventricular tachycardia ([place])   . Pneumonia    "when I was a kid"  . Presence of permanent cardiac pacemaker   . Primary malignant neoplasm of bladder ([place])   . Prostate cancer ([place])     PSH: Past Surgical History:  Procedure Laterality Date  . APPENDECTOMY    . CARDIAC CATHETERIZATION  N/A 01/17/2016   Procedure: Right/Left Heart Cath and Coronary Angiography;  Surgeon: Belva Crome, MD;  Location: [facility];  Service: Cardiovascular;  Laterality: N/A;  . CARDIAC CATHETERIZATION N/A 04/30/2016   Procedure: Coronary/Graft Atherectomy;  Surgeon: Sherren Mocha, MD;  Location: [facility];  Service: Cardiovascular;  Laterality: N/A;  . CAROTID ENDARTERECTOMY Bilateral   . CATARACT EXTRACTION W/ INTRAOCULAR LENS  IMPLANT, BILATERAL Bilateral   . COLON  SURGERY  1981   Meckles Diverticulum with volvulus  . EP IMPLANTABLE DEVICE N/A 03/21/2016   Procedure: Pacemaker Implant;  Surgeon: Evans Lance, MD;  Location: [facility];  Service: Cardiovascular;  Laterality: N/A;  . INGUINAL HERNIA REPAIR Right 2009  . INSERTION PROSTATE RADIATION SEED    . TEE WITHOUT CARDIOVERSION N/A 06/10/2016   Procedure: TRANSESOPHAGEAL ECHOCARDIOGRAM (TEE);  Surgeon: Sherren Mocha, MD;  Location: [facility];  Service: Open Heart Surgery;  Laterality: N/A;  . TONSILLECTOMY  ~ 1947  . TRANSCATHETER AORTIC VALVE REPLACEMENT, TRANSFEMORAL N/A 06/10/2016   Procedure: TRANSCATHETER AORTIC VALVE REPLACEMENT, TRANSFEMORAL;  Surgeon: Sherren Mocha, MD;  Location: [facility];  Service: Open Heart Surgery;  Laterality: N/A;    Medications Prior to Admission  Medication Sig Dispense Refill Last Dose  . acetaminophen (TYLENOL) 500 MG tablet Take 500 mg by mouth as needed for mild pain.    11/06/2017 at prn  . acetaminophen-codeine (TYLENOL #3) 300-30 MG tablet Take 1 tablet by mouth at bedtime.   11/06/2017 at Unknown time  . albuterol (PROVENTIL) (2.5 MG/3ML) 0.083% nebulizer solution Take 3 mLs (2.5 mg total) by nebulization every 8 (eight) hours as needed for wheezing or shortness of breath. 50 mL 0 Past Week at Unknown time  . apixaban (ELIQUIS) 2.5 MG TABS tablet Take 1 tablet (2.5 mg total) by mouth 2 (two) times daily. 180 tablet 3 11/06/2017 at 0630   . atorvastatin (LIPITOR) 20 MG tablet Take 1 tablet (20 mg total) by mouth daily. 90 tablet 3 11/06/2017 at Unknown time  . budesonide-formoterol (SYMBICORT) 80-4.5 MCG/ACT inhaler Inhale 2 puffs into the lungs 2 (two) times daily. 1 Inhaler 0 11/06/2017 at Unknown time  . cholecalciferol (VITAMIN D) 1000 units tablet Take 1,000 Units by mouth 2 (two) times a week.    Past Week at Unknown time  . Coenzyme Q10 (COQ-10 PO) Take 1 capsule by mouth 2 (two) times a week.    Past Week at Unknown time  . Cyanocobalamin (VITAMIN B 12 PO) Take  1 capsule by mouth daily.   Past Week at Unknown time  . furosemide (LASIX) 20 MG tablet Take 1 tablet (20 mg total) by mouth daily as needed for edema. 90 tablet 3 10/24/2017 at prn  . latanoprost (XALATAN) 0.005 % ophthalmic solution Place 1 drop into both eyes at bedtime.   11/06/2017 at Unknown time  . levothyroxine (SYNTHROID, LEVOTHROID) 25 MCG tablet Take 0.5 tablets (12.5 mcg total) by mouth daily before breakfast. 45 tablet 3 11/06/2017 at 0500  . menthol-cetylpyridinium (CEPACOL) 3 MG lozenge Take 1 lozenge (3 mg total) by mouth as needed for sore throat. 100 tablet 12 11/04/2017 at prn  . metoprolol succinate (TOPROL XL) 25 MG 24 hr tablet Take 1 tablet (25 mg total) by mouth daily. 30 tablet 11 11/06/2017 at 0500  . Netarsudil Dimesylate (RHOPRESSA) 0.02 % SOLN Place 1 drop into both eyes at bedtime. Sample given by the physician   11/06/2017 at Unknown time  . nitroGLYCERIN (NITROSTAT) 0.4  MG SL tablet Place 1 tablet (0.4 mg total) under the tongue every 5 (five) minutes as needed. 25 tablet 3 unknown at prn  . predniSONE (DELTASONE) 10 MG tablet Take 4 tablets (40 mg) daily for the next week, take 3 tablets (30 mg) daily for the following week, then take 2 tablets (20 mg) daily until follow-up appointment (Patient taking differently: Take 40 mg by mouth daily with breakfast. Take 4 tablets (40 mg) daily for the next week, take 3 tablets (30 mg) daily for the following week, then take 2 tablets (20 mg) daily until follow-up appointment) 80 tablet 1 11/06/2017 at Unknown time  . spironolactone (ALDACTONE) 25 MG tablet Take 0.5 tablets (12.5 mg total) by mouth daily. 45 tablet 1 11/06/2017 at Unknown time    SH: Social History   Tobacco Use  . Smoking status: Never Smoker  . Smokeless tobacco: Never Used  Substance Use Topics  . Alcohol use: No    Alcohol/week: 0.0 oz    Comment: 04/30/2016 "nothing in years"  . Drug use: No    MEDS: Prior to Admission medications   Medication Sig Start Date  End Date Taking? Authorizing Provider  acetaminophen (TYLENOL) 500 MG tablet Take 500 mg by mouth as needed for mild pain.    Yes [provider]  acetaminophen-codeine (TYLENOL #3) 300-30 MG tablet Take 1 tablet by mouth at bedtime.   Yes [provider]  albuterol (PROVENTIL) (2.5 MG/3ML) 0.083% nebulizer solution Take 3 mLs (2.5 mg total) by nebulization every 8 (eight) hours as needed for wheezing or shortness of breath. 10/13/17  Yes Nafziger, Tommi Rumps, NP  apixaban (ELIQUIS) 2.5 MG TABS tablet Take 1 tablet (2.5 mg total) by mouth 2 (two) times daily. 10/22/17  Yes Evans Lance, MD  atorvastatin (LIPITOR) 20 MG tablet Take 1 tablet (20 mg total) by mouth daily. 11/24/16  Yes Dorothy Spark, MD  budesonide-formoterol [facility]) 80-4.5 MCG/ACT inhaler Inhale 2 puffs into the lungs 2 (two) times daily. 10/20/17  Yes Lauraine Rinne, NP  cholecalciferol (VITAMIN D) 1000 units tablet Take 1,000 Units by mouth 2 (two) times a week.    Yes [provider]  Coenzyme Q10 (COQ-10 PO) Take 1 capsule by mouth 2 (two) times a week.    Yes [provider]  Cyanocobalamin (VITAMIN B 12 PO) Take 1 capsule by mouth daily.   Yes [provider]  furosemide (LASIX) 20 MG tablet Take 1 tablet (20 mg total) by mouth daily as needed for edema. 10/22/17 01/20/18 Yes Dorothy Spark, MD  latanoprost (XALATAN) 0.005 % ophthalmic solution Place 1 drop into both eyes at bedtime.   Yes [provider]  levothyroxine (SYNTHROID, LEVOTHROID) 25 MCG tablet Take 0.5 tablets (12.5 mcg total) by mouth daily before breakfast. 10/21/17  Yes Dorothy Spark, MD  menthol-cetylpyridinium (CEPACOL) 3 MG lozenge Take 1 lozenge (3 mg total) by mouth as needed for sore throat. 10/24/17  Yes Lavina Hamman, MD  metoprolol succinate (TOPROL XL) 25 MG 24 hr tablet Take 1 tablet (25 mg total) by mouth daily. 07/15/17 07/10/18 Yes Dorothy Spark, MD  Netarsudil Dimesylate (RHOPRESSA)  0.02 % SOLN Place 1 drop into both eyes at bedtime. Sample given by the physician   Yes [provider]  nitroGLYCERIN (NITROSTAT) 0.4 MG SL tablet Place 1 tablet (0.4 mg total) under the tongue every 5 (five) minutes as needed. 05/01/16  Yes Cheryln Manly, NP  predniSONE (DELTASONE) 10 MG tablet  Take 4 tablets (40 mg) daily for the next week, take 3 tablets (30 mg) daily for the following week, then take 2 tablets (20 mg) daily until follow-up appointment Patient taking differently: Take 40 mg by mouth daily with breakfast. Take 4 tablets (40 mg) daily for the next week, take 3 tablets (30 mg) daily for the following week, then take 2 tablets (20 mg) daily until follow-up appointment 11/03/17  Yes Lauraine Rinne, NP  spironolactone (ALDACTONE) 25 MG tablet Take 0.5 tablets (12.5 mg total) by mouth daily. 11/05/17  Yes Dunn, Dayna N, PA-C    ALLERGY: Allergies  Allergen Reactions  . Ciprofloxacin Anaphylaxis  . Hydrochlorothiazide Anaphylaxis  . Sulfa Antibiotics Anaphylaxis  . Ace Inhibitors Cough  . Losartan Cough  . Carvedilol Cough    Pt states it "causes him too cough."  . Lisinopril Cough    Pt reports worsening cough and "feeling wheezy."    Social History   Tobacco Use  . Smoking status: Never Smoker  . Smokeless tobacco: Never Used  Substance Use Topics  . Alcohol use: No    Alcohol/week: 0.0 oz    Comment: 04/30/2016 "nothing in years"     Family History  Problem Relation Age of Onset  . Heart disease Mother   . Heart disease Father      ROS   Review of Systems  Constitutional: Negative.   HENT: Negative.   Eyes: Negative.   Respiratory: Negative.   Cardiovascular: Negative.   Gastrointestinal: Negative.   Genitourinary: Negative.   Musculoskeletal: Positive for back pain and myalgias. Negative for neck pain.  Skin: Negative.   Neurological: Negative for dizziness, tingling, tremors, sensory change, speech change, focal weakness, seizures, weakness  and headaches.    Exam   Vitals:   11/08/17 0826 11/08/17 0959  BP:  (!) 150/63  Pulse:  76  Resp:  20  Temp:  97.8 F (36.6 C)  SpO2: 96% 95%   General appearance: Elderly male, laying on left side, seems uncomfortable at times Eyes: PERRL, Fundoscopic: normal Cardiovascular: Regular rate and rhythm without murmurs, rubs, gallops. No edema or variciosities. Distal pulses normal. Pulmonary: Clear to auscultation Musculoskeletal:     Muscle tone upper extremities: Normal    Muscle tone lower extremities: Normal    Motor exam: Upper Extremities Deltoid Bicep Tricep Grip  Right 5/5 5/5 5/5 5/5  Left 5/5 5/5 5/5 5/5   Lower Extremity IP Quad PF DF EHL  Right 5/5 5/5 5/5 5/5 5/5  Left 4/5 secondary to pain 5/5 5/5 5/5 5/5   Neurological Awake, alert, oriented Memory and concentration grossly intact Speech fluent, appropriate CNII: Visual fields normal CNIII/IV/VI: EOMI CNV: Facial sensation normal CNVII: Symmetric, normal strength CNVIII: Grossly normal CNIX: Normal palate movement CNXI: Trap and SCM strength normal CN XII: Tongue protrusion normal Sensation grossly intact to LT DTR: Normal Coordination (finger/nose & heel/shin): Normal  Results - Imaging/Labs   Results for orders placed or performed during the hospital encounter of 11/07/17 (from the past 48 hour(s))  Comprehensive metabolic panel     Status: Abnormal   Collection Time: 11/07/17  8:49 AM  Result Value Ref Range   Sodium 141 135 - 145 mmol/L   Potassium 4.0 3.5 - 5.1 mmol/L   Chloride 107 101 - 111 mmol/L   CO2 25 22 - 32 mmol/L   Glucose, Bld 97 65 - 99 mg/dL   BUN 20 6 - 20 mg/dL   Creatinine, Ser 1.18 0.61 -  1.24 mg/dL   Calcium 8.7 (L) 8.9 - 10.3 mg/dL   Total Protein 6.0 (L) 6.5 - 8.1 g/dL   Albumin 3.0 (L) 3.5 - 5.0 g/dL   AST 48 (H) 15 - 41 U/L   ALT 49 17 - 63 U/L   Alkaline Phosphatase 90 38 - 126 U/L   Total Bilirubin 0.9 0.3 - 1.2 mg/dL   GFR calc non Af Amer 54 (L) >60 mL/min    GFR calc Af Amer >60 >60 mL/min    Comment: (NOTE) The eGFR has been calculated using the CKD EPI equation. This calculation has not been validated in all clinical situations. eGFR's persistently <60 mL/min signify possible Chronic Kidney Disease.    Anion gap 9 5 - 15    Comment: Performed at [facility] [address] [place], [place] [address]  CBC with Differential     Status: Abnormal   Collection Time: 11/07/17  8:49 AM  Result Value Ref Range   WBC 11.3 (H) 4.0 - 10.5 K/uL   RBC 4.80 4.22 - 5.81 MIL/uL   Hemoglobin 12.3 (L) 13.0 - 17.0 g/dL   HCT 39.5 39.0 - 52.0 %   MCV 82.3 78.0 - 100.0 fL   MCH 25.6 (L) 26.0 - 34.0 pg   MCHC 31.1 30.0 - 36.0 g/dL   RDW 19.4 (H) 11.5 - 15.5 %   Platelets 60 (L) 150 - 400 K/uL    Comment: SPECIMEN CHECKED FOR CLOTS REPEATED TO VERIFY PLATELET COUNT CONFIRMED BY SMEAR    Neutrophils Relative % 83 %   Neutro Abs 9.3 (H) 1.7 - 7.7 K/uL   Lymphocytes Relative 6 %   Lymphs Abs 0.7 0.7 - 4.0 K/uL   Monocytes Relative 9 %   Monocytes Absolute 1.0 0.1 - 1.0 K/uL   Eosinophils Relative 0 %   Eosinophils Absolute 0.0 0.0 - 0.7 K/uL   Basophils Relative 0 %   Basophils Absolute 0.0 0.0 - 0.1 K/uL   Immature Granulocytes 2 %   Abs Immature Granulocytes 0.3 (H) 0.0 - 0.1 K/uL    Comment: Performed at [facility], [place] [address] [place], [place] [address]  Troponin I     Status: Abnormal   Collection Time: 11/07/17  8:49 AM  Result Value Ref Range   Troponin I 0.20 (HH) <0.03 ng/mL    Comment: CRITICAL RESULT CALLED TO, READ BACK BY AND VERIFIED WITH: R.RICHARDS RN @ 1059 11/07/17 BY C.EDENS Performed at [facility], [place] [address] [place], [place] [address]   Protime-INR     Status: Abnormal   Collection Time: 11/07/17  8:49 AM  Result Value Ref Range   Prothrombin Time 16.3 (H) 11.4 - 15.2 seconds   INR 1.33     Comment: Performed at [facility] [address] [place],  [address]  Brain  natriuretic peptide     Status: Abnormal   Collection Time: 11/07/17  8:49 AM  Result Value Ref Range   B Natriuretic Peptide 219.3 (H) 0.0 - 100.0 pg/mL    Comment: Performed at [facility] [address], [place], [place] [address]  Troponin I (q 6hr x 3)     Status: Abnormal   Collection Time: 11/07/17 12:27 PM  Result Value Ref Range   Troponin I 0.09 (HH) <0.03 ng/mL    Comment: CRITICAL VALUE NOTED.  VALUE IS CONSISTENT WITH PREVIOUSLY REPORTED AND CALLED VALUE. Performed at [facility],  [address], [place], [place] [address]   Urinalysis, Routine w reflex microscopic     Status: None   Collection Time: 11/07/17 12:55 PM  Result Value Ref Range   Color, Urine YELLOW YELLOW   APPearance CLEAR CLEAR   Specific Gravity, Urine 1.011 1.005 - 1.030   pH 6.0 5.0 - 8.0   Glucose, UA NEGATIVE NEGATIVE mg/dL   Hgb urine dipstick NEGATIVE NEGATIVE   Bilirubin Urine NEGATIVE NEGATIVE   Ketones, ur NEGATIVE NEGATIVE mg/dL   Protein, ur NEGATIVE NEGATIVE mg/dL   Nitrite NEGATIVE NEGATIVE   Leukocytes, UA NEGATIVE NEGATIVE    Comment: Performed at [facility] [address] [place], [place] [address]  TSH     Status: None   Collection Time: 11/07/17  6:34 PM  Result Value Ref Range   TSH 2.060 0.350 - 4.500 uIU/mL    Comment: Performed by a 3rd Generation assay with a functional sensitivity of <=0.01 uIU/mL. Performed at [facility], [place] [address] [place], [place] [address]   Troponin I (q 6hr x 3)     Status: Abnormal   Collection Time: 11/07/17  6:34 PM  Result Value Ref Range   Troponin I 0.08 (HH) <0.03 ng/mL    Comment: CRITICAL VALUE NOTED.  VALUE IS CONSISTENT WITH PREVIOUSLY REPORTED AND CALLED VALUE. Performed at [facility], [place] [address], [place], [place] [address]   Troponin I (q 6hr x 3)     Status: Abnormal   Collection Time: 11/08/17  2:31 AM  Result Value Ref Range   Troponin I 0.08 (HH) <0.03 ng/mL    Comment: CRITICAL  VALUE NOTED.  VALUE IS CONSISTENT WITH PREVIOUSLY REPORTED AND CALLED VALUE. Performed at [facility], [place] [address] [place], [place] [address]   Basic metabolic panel     Status: Abnormal   Collection Time: 11/08/17  2:31 AM  Result Value Ref Range   Sodium 139 135 - 145 mmol/L   Potassium 4.1 3.5 - 5.1 mmol/L   Chloride 103 101 - 111 mmol/L   CO2 27 22 - 32 mmol/L   Glucose, Bld 98 65 - 99 mg/dL   BUN 17 6 - 20 mg/dL   Creatinine, Ser 1.03 0.61 - 1.24 mg/dL   Calcium 8.7 (L) 8.9 - 10.3 mg/dL   GFR calc non Af Amer >60 >60 mL/min   GFR calc Af Amer >60 >60 mL/min    Comment: (NOTE) The eGFR has been calculated using the CKD EPI equation. This calculation has not been validated in all clinical situations. eGFR's persistently <60 mL/min signify possible Chronic Kidney Disease.    Anion gap 9 5 - 15    Comment: Performed at [facility] [address] [place], [place] [address]  CBC     Status: Abnormal   Collection Time: 11/08/17  2:31 AM  Result Value Ref Range   WBC 11.1 (H) 4.0 - 10.5 K/uL   RBC 4.53 4.22 - 5.81 MIL/uL   Hemoglobin 11.7 (L) 13.0 - 17.0 g/dL   HCT 37.0 (L) 39.0 - 52.0 %   MCV 81.7 78.0 - 100.0 fL   MCH 25.8 (L) 26.0 - 34.0 pg   MCHC 31.6 30.0 - 36.0 g/dL   RDW 19.5 (H) 11.5 - 15.5 %   Platelets 50 (L) 150 - 400 K/uL    Comment: CONSISTENT WITH PREVIOUS RESULT Performed at [facility], [place] [address] [place], [place]  [place]     Status: Abnormal   Collection Time: 11/08/17  2:31 AM  Result Value Ref Range   Prothrombin Time 16.1 (H) 11.4 - 15.2 seconds   INR 1.30     Comment: Performed at [facility] [address], [place], [place] [address]  Save smear     Status: None   Collection Time: 11/08/17  2:31 AM  Result Value Ref Range   Smear Review SMEAR STAINED AND AVAILABLE FOR REVIEW     Comment: Performed at [facility] [address], [place],  [address]    Dg Chest 1 View  Result  Date: 11/07/2017 CLINICAL DATA:  Respiratory distress EXAM: CHEST  1 VIEW COMPARISON:  Oct 23, 2017 FINDINGS: There is slight scarring in the lower lung zones. There is no edema or consolidation. Heart is mildly enlarged with pulmonary vascularity normal. Pacemaker leads attached right atrium and right ventricle. There is a prosthetic aortic valve. There is aortic atherosclerosis. No. Bones appear osteoporotic. IMPRESSION: Slight scarring in the lower lung zones. No edema or consolidation. Stable cardiac prominence. Pacemaker leads attached to right atrium right ventricle. Aortic valve replacement present. There is aortic atherosclerosis. Aortic Atherosclerosis (ICD10-I70.0). Electronically Signed   By: Lowella Grip III M.D.   On: 11/07/2017 09:35   Dg Lumbar Spine Complete  Result Date: 11/07/2017 CLINICAL DATA:  Low back pain.  Worsening for 3 days. EXAM: LUMBAR SPINE - COMPLETE 4+ VIEW COMPARISON:  10/14/2017 FINDINGS: Again noted is some levoscoliosis in the lumbar spine. The curvature is similar to the comparison examination. Again noted is significant anterolisthesis at L5-S1 measuring roughly 1.8 cm and unchanged. This is compatible with grade 2 or grade 3 spondylolisthesis. Chronic disc space narrowing and endplate changes at P5-K9 and L2-L3. Mild retrolisthesis at L2-L3. The vertebral body heights are maintained. Limited evaluation for pars defects but suspect pars defects at L5 associated with the anterolisthesis. Large amount of stool in the abdomen. IMPRESSION: Stable appearance of the lumbar spine without acute abnormality. Scoliosis with multilevel degenerative disease and significant anterolisthesis at L5-S1. Large stool burden. Electronically Signed   By: Markus Daft M.D.   On: 11/07/2017 09:38   Ct Lumbar Spine Wo Contrast  Result Date: 11/07/2017 CLINICAL DATA:  Severe low back pain. Abnormal lumbar spine radiographs. EXAM: CT LUMBAR SPINE WITHOUT CONTRAST TECHNIQUE: Multidetector CT  imaging of the lumbar spine was performed without intravenous contrast administration. Multiplanar CT image reconstructions were also generated. COMPARISON:  Lumbar spine radiographs 11/07/2017. CT of the abdomen and pelvis 03/19/2016. FINDINGS: Segmentation: 5 non rib-bearing lumbar type vertebral bodies are present. Alignment: Grade 2 anterolisthesis at L5-S1 measures 18 mm, associated with bilateral L5 pars defects. Slight retrolisthesis at L1-2 and L2-3 is stable. Levoconvex curvature of the lumbar spine is centered at L3. There is rightward curvature centered at L1. Vertebrae: Chronic sclerotic changes are again noted on the left at L1-2 and more diffusely at L2-3. Chronic sclerotic changes are present at L5-S1. Vertebral body heights are maintained. No acute fracture is present. Paraspinal and other soft tissues: Bilateral pleural thickening is present. The heart is enlarged. Pacing wires are in place. Visualized abdominal organs are within normal limits. Atherosclerotic calcifications are present in the aorta without aneurysm. No significant adenopathy is present. Disc levels: T12-L1: Negative. L1-2: Asymmetric endplate changes are present on the left. The central canal is patent. Moderate bilateral foraminal stenosis is stable. L2-3: Slight retrolisthesis is present. Facet hypertrophy is asymmetric  on the right. This results in moderate right and mild left subarticular narrowing. Moderate foraminal stenosis is present on the right, unchanged. Mild left foraminal narrowing is present. L3-4: A rightward disc protrusion is present. Mild facet hypertrophy is noted. This results in mild right subarticular narrowing. Mild foraminal narrowing bilaterally is stable. L4-5: Mild disc bulging and left greater than right facet hypertrophy are present. Central canal is patent. Mild foraminal narrowing is worse on the left. L5-S1: Grade 2 anterolisthesis is present. The central canal is patent. Severe left and moderate  right foraminal stenosis is present. IMPRESSION: 1. Stable multilevel spondylosis of the lumbar spine. 2. Bilateral pars defects at L5 with grade 2 anterolisthesis measuring up to 18 mm. 3. Severe left and moderate right foraminal stenosis at L5-S1 is similar the prior study. 4. Mild foraminal narrowing at L4-5 is worse on the left. 5. Mild right subarticular narrowing and mild bilateral foraminal stenosis at L3-4 is stable. 6. Moderate right and mild left subarticular and foraminal narrowing at L2-3 is similar the prior study. 7. Moderate bilateral foraminal stenosis at L1-2 is stable. Electronically Signed   By: San Morelle M.D.   On: 11/07/2017 18:25   US Renal  Result Date: 11/07/2017 CLINICAL DATA:  Urinary retention. EXAM: RENAL / URINARY TRACT ULTRASOUND COMPLETE COMPARISON:  None. FINDINGS: Right Kidney: Length: 9.1 cm. Echogenicity within normal limits. No mass or hydronephrosis visualized. Left Kidney: Length: 10.1 cm. Echogenicity within normal limits. No mass or hydronephrosis visualized. Bladder: Empty and not well visualized on this exam. IMPRESSION: Normal appearance of both kidneys.  No evidence of hydronephrosis. Empty urinary bladder. Electronically Signed   By: Earle Gell M.D.   On: 11/07/2017 13:46    Impression/Plan   84 y.o. male with acute exacerbation of low back pain. He is neurologically intact with exception of left hip weakness secondary to pain. No drop foot. Endorses one episode of ?urinary incontinence with this acute pain but has since been able to urinate on command without difficulties here in the hospital. At baseline, he wears O2 chronically, ambulates minimally at home (per wife several steps at a time), on elquis for afib, recently diagnosed with amiodarone toxicity. He has new thrombocytopenia (60).  I have reviewed the images alongside attending Dr Kathyrn Sheriff who has also evaluated the patient and discussed results/recs with patient and family at  bedside.  CT scan is significant for multilevel degenerative changes and anterolisthesis of L5 on S1. There is no high grade central canal stenosis indicating necessity for emergent NS intervention. In fact, given age, comorbidities (eliquis in combo with thrombocytopenia), baseline of minimal ambulation at home, he is a poor surgical candidate and risks greatly outweigh benefits.  His pain is likely multifactorial due to aforementioned and will hopefully improve with time over the next several days to several weeks. Based on history and exam, we feel as though there is no indication to proceed with CT myelogram at this point. Order has been d/c. We discussed this at length with patient and family who agree. We will try to treat this conservatively with po mediations (muscle relaxers, steroids, tylenol, short course of narcotics as necessary) and working with therapy. We will be happy to f/u outpt with him as necessary.  Please call for any concerns.

## 2017-11-08 NOTE — Consult Note (Signed)
[facility] CONSULT NOTE  Patient Care Team: Dorothyann Peng, NP as PCP - General (Family Medicine) Dorothy Spark, MD as PCP - Cardiology (Cardiology)  CHIEF COMPLAINTS/PURPOSE OF CONSULTATION:  Acute thrombocytopenia  HISTORY OF PRESENTING ILLNESS:  Ryan [name] 84 y.o. male is seen per request by hospitalist for evaluation of severe back pain and acute thrombocytopenia The patient is mildly sedated from pain medicine. His wife and daughter were able to give excellent history. I have reviewed his records extensively.  The patient had history of aortic valve replacement therapy, chronic atrial fibrillation on chronic anticoagulation therapy and status post pacemaker implantation, has not been feeling well over the past few months or so due to progressive shortness of breath.  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  He was briefly hospitalized approximately 2 weeks ago for mild confusion, presyncopal episode and mild acute on chronic respiratory failure and was extensively evaluated in the hospital. Since then, he has started to require oxygen therapy for low oxygen saturation.   He had extensive cardiology evaluation which showed mild cardiomyopathy with reduced ejection fraction, mild to moderate stenosis of the prosthetic aortic valve and increased pulmonary arterial pressure Serial cardiac troponin was negative. He was subsequently discharged home after receiving 1 unit of intravenous iron for mild iron deficiency anemia  Most recently, he was started on prednisone therapy at 40 mg daily with some mild improvement of his oxygenation  Starting last week, he started to develop significant, acute back pain of sudden onset. He denies recent trauma or falls His back pain was so severe that he presented to  the emergency department yesterday and was subsequently admitted for pain control He had no bowel movement since last week On the admission, he was noted to have elevated white blood cell count from baseline of 8.7. Hemoglobin is mildly reduced at 11.7. He was noted to have acute drop of platelet count to 60,000, compared to baseline of 214,000 on 10/23/2017 He denies recent bleeding, such as spontaneous epistaxis, hematuria, melena or hematochezia Family members noted excessive easy bruising He denies prior blood or platelet transfusions  CT scan of the lumbar spine without contrast showed multilevel spondylosis and foraminal stenosis, not enough to explain the degree of severe pain The patient also complained of abdominal bloating and presence of a hard mass in his lower abdomen since admission He denies nausea At his current state, he denies chest pain or worsening shortness of breath His cough has improved since recent prednisone treatment His appetite has been stable up until last week.   MEDICAL HISTORY:  Past Medical History:  Diagnosis Date  . Age-related macular degeneration   . Allergic rhinitis   . Amiodarone toxicity   . Aortic valve stenosis    a. s/p TAVR 06/2016.  . Arthritis    "hands, lower back" (04/30/2016)  . Asthma   . Cardiomyopathy ([place])    a. EF declined to 35-40% by echo 09/2017 (previously 50-55% after TAVR).  . Carotid artery obstruction    a. s/p bilat CEA.  . Chronic combined systolic and diastolic CHF (congestive heart failure) ([place])   . Chronic lower back pain   . Coronary arteriosclerosis in native artery    a. 2017 Cardiac catheterization demonstrated worsening CAD with 70% mid LCx, 80% OM1 and 80% mid RCA stenosis. b.  In  11/17, he underwent successful rotational atherectomy and DES to RCA.  . Diverticulitis of colon   . DJD (degenerative joint disease)   . Dyspnea    "constant but the degree changes"  . GERD (gastroesophageal reflux disease)    . History of elevated PSA 05/2009  . History of hiatal hernia   . HLD (hyperlipidemia)   . Hypertension   . Nocturnal hypoxemia   . OSA (obstructive sleep apnea)    intolerant to CPAP  . PAF (paroxysmal atrial fibrillation) ([place])   . Paroxysmal supraventricular tachycardia ([place])   . Pneumonia    "when I was a kid"  . Presence of permanent cardiac pacemaker   . Primary malignant neoplasm of bladder ([place])   . Prostate cancer [facility])     SURGICAL HISTORY: Past Surgical History:  Procedure Laterality Date  . APPENDECTOMY    . CARDIAC CATHETERIZATION N/A 01/17/2016   Procedure: Right/Left Heart Cath and Coronary Angiography;  Surgeon: Belva Crome, MD;  Location: [place] CV LAB;  Service: Cardiovascular;  Laterality: N/A;  . CARDIAC CATHETERIZATION N/A 04/30/2016   Procedure: Coronary/Graft Atherectomy;  Surgeon: Sherren Mocha, MD;  Location: [place] CV LAB;  Service: Cardiovascular;  Laterality: N/A;  . CAROTID ENDARTERECTOMY Bilateral   . CATARACT EXTRACTION W/ INTRAOCULAR LENS  IMPLANT, BILATERAL Bilateral   . COLON SURGERY  1981   Meckles Diverticulum with volvulus  . EP IMPLANTABLE DEVICE N/A 03/21/2016   Procedure: Pacemaker Implant;  Surgeon: Evans Lance, MD;  Location: [place] CV LAB;  Service: Cardiovascular;  Laterality: N/A;  . INGUINAL HERNIA REPAIR Right 2009  . INSERTION PROSTATE RADIATION SEED    . TEE WITHOUT CARDIOVERSION N/A 06/10/2016   Procedure: TRANSESOPHAGEAL ECHOCARDIOGRAM (TEE);  Surgeon: Sherren Mocha, MD;  Location: [place];  Service: Open Heart Surgery;  Laterality: N/A;  . TONSILLECTOMY  ~ 1947  . TRANSCATHETER AORTIC VALVE REPLACEMENT, TRANSFEMORAL N/A 06/10/2016   Procedure: TRANSCATHETER AORTIC VALVE REPLACEMENT, TRANSFEMORAL;  Surgeon: Sherren Mocha, MD;  Location: [place];  Service: Open Heart Surgery;  Laterality: N/A;    SOCIAL HISTORY: Social History   Socioeconomic History  . Marital status: Married    Spouse name: Not on file  .  Number of children: Not on file  . Years of education: Not on file  . Highest education level: Not on file  Occupational History  . Occupation: retired  Scientific laboratory technician  . Financial resource strain: Not on file  . Food insecurity:    Worry: Not on file    Inability: Not on file  . Transportation needs:    Medical: Not on file    Non-medical: Not on file  Tobacco Use  . Smoking status: Never Smoker  . Smokeless tobacco: Never Used  Substance and Sexual Activity  . Alcohol use: No    Alcohol/week: 0.0 oz    Comment: 04/30/2016 "nothing in years"  . Drug use: No  . Sexual activity: Never  Lifestyle  . Physical activity:    Days per week: Not on file    Minutes per session: Not on file  . Stress: Not on file  Relationships  . Social connections:    Talks on phone: Not on file    Gets together: Not on file    Attends religious service: Not on file    Active member of club or organization: Not on file    Attends meetings of clubs or organizations: Not on file    Relationship status: Not on file  .  Intimate partner violence:    Fear of current or ex partner: Not on file    Emotionally abused: Not on file    Physically abused: Not on file    Forced sexual activity: Not on file  Other Topics Concern  . Not on file  Social History Narrative   Retired    Three children - One in [place], One in [place], and one in [place].        FAMILY HISTORY: Family History  Problem Relation Age of Onset  . Heart disease Mother   . Heart disease Father     ALLERGIES:  is allergic to ciprofloxacin; hydrochlorothiazide; sulfa antibiotics; ace inhibitors; losartan; carvedilol; and lisinopril.  MEDICATIONS:  Current Facility-Administered Medications  Medication Dose Route Frequency Provider Last Rate Last Dose  . acetaminophen (TYLENOL) tablet 500 mg  500 mg Oral PRN Radene Gunning, NP      . albuterol (PROVENTIL) (2.5 MG/3ML) 0.083% nebulizer solution 2.5 mg  2.5 mg Nebulization Q8H PRN  Black, Karen M, NP      . atorvastatin (LIPITOR) tablet 20 mg  20 mg Oral Daily Radene Gunning, NP   20 mg at 11/08/17 1022  . bisacodyl (DULCOLAX) EC tablet 5 mg  5 mg Oral Daily PRN Radene Gunning, NP      . bisacodyl (DULCOLAX) suppository 10 mg  10 mg Rectal Daily PRN Regalado, Belkys A, MD      . budesonide (PULMICORT) nebulizer solution 0.5 mg  0.5 mg Nebulization Daily Black, Lezlie Octave, NP      . cyclobenzaprine (FLEXERIL) tablet 5 mg  5 mg Oral TID PRN Regalado, Belkys A, MD   5 mg at 11/08/17 1022  . famotidine (PEPCID) tablet 20 mg  20 mg Oral Daily Regalado, Belkys A, MD   20 mg at 11/08/17 1023  . feeding supplement (ENSURE ENLIVE) (ENSURE ENLIVE) liquid 237 mL  237 mL Oral BID BM Regalado, Belkys A, MD   237 mL at 11/08/17 1022  . Glycerin (Adult) 2.1 g suppository 1 suppository  1 suppository Rectal Daily PRN Radene Gunning, NP      . HYDROmorphone (DILAUDID) injection 0.5 mg  0.5 mg Intravenous Q3H PRN Regalado, Belkys A, MD   0.5 mg at 11/08/17 1130  . latanoprost (XALATAN) 0.005 % ophthalmic solution 1 drop  1 drop Both Eyes QHS Radene Gunning, NP   1 drop at 11/07/17 2243  . levothyroxine (SYNTHROID, LEVOTHROID) tablet 12.5 mcg  12.5 mcg Oral QAC breakfast Radene Gunning, NP   12.5 mcg at 11/08/17 0825  . lidocaine (LIDODERM) 5 % 1 patch  1 patch Transdermal Q24H Regalado, Belkys A, MD      . menthol-cetylpyridinium (CEPACOL) lozenge 3 mg  1 lozenge Oral PRN Radene Gunning, NP      . metoprolol succinate (TOPROL-XL) 24 hr tablet 25 mg  25 mg Oral Daily Radene Gunning, NP   25 mg at 11/08/17 1023  . mometasone-formoterol (DULERA) 100-5 MCG/ACT inhaler 2 puff  2 puff Inhalation BID Radene Gunning, NP   2 puff at 11/08/17 0826  . nitroGLYCERIN (NITROSTAT) SL tablet 0.4 mg  0.4 mg Sublingual Q5 min PRN Radene Gunning, NP      . ondansetron [facility]) tablet 4 mg  4 mg Oral Q6H PRN Radene Gunning, NP       Or  . ondansetron [facility]) injection 4 mg  4 mg Intravenous Q6H PRN Black, Lezlie Octave,  NP      .  oxyCODONE-acetaminophen (PERCOCET/ROXICET) 5-325 MG per tablet 1-2 tablet  1-2 tablet Oral Q6H PRN Regalado, Belkys A, MD      . polyethylene glycol (MIRALAX / GLYCOLAX) packet 17 g  17 g Oral BID Regalado, Belkys A, MD   17 g at 11/08/17 1020  . predniSONE (DELTASONE) tablet 40 mg  40 mg Oral Q breakfast Radene Gunning, NP   40 mg at 11/08/17 0825  . senna-docusate (Senokot-S) tablet 1 tablet  1 tablet Oral BID Radene Gunning, NP   1 tablet at 11/08/17 1023  . traZODone (DESYREL) tablet 25 mg  25 mg Oral QHS PRN Black, Lezlie Octave, NP      . vitamin B-12 (CYANOCOBALAMIN) tablet 500 mcg  500 mcg Oral Daily Florencia Reasons, MD   500 mcg at 11/08/17 1022    REVIEW OF SYSTEMS:   Constitutional: Denies fevers, chills or abnormal night sweats Eyes: Denies blurriness of vision, double vision or watery eyes Ears, nose, mouth, throat, and face: Denies mucositis or sore throat Skin: Denies abnormal skin rashes Lymphatics: Denies new lymphadenopathy  Behavioral/Psych: Mood is stable, no new changes  All other systems were reviewed with the patient and are negative.  PHYSICAL EXAMINATION: ECOG PERFORMANCE STATUS: 2 - Symptomatic, <50% confined to bed  Vitals:   11/08/17 0826 11/08/17 0959  BP:  (!) 150/63  Pulse:  76  Resp:  20  Temp:  97.8 F (36.6 C)  SpO2: 96% 95%   Filed Weights   11/07/17 0757  Weight: 140 lb (63.5 kg)    GENERAL:alert, no distress and comfortable.  He is ill-appearing, with oxygen delivered via nasal cannula SKIN: skin color is mildly pale, texture, turgor are normal, no rashes or significant lesions EYES: normal, conjunctiva are mildly pale and non-injected, sclera clear OROPHARYNX: Dry mucous membrane is noted NECK: supple, thyroid normal size, non-tender, without nodularity.  Noted well-healed surgical scar on the left side of his neck LYMPH:  no palpable lymphadenopathy in the cervical, axillary or inguinal LUNGS: clear to auscultation and percussion with  normal breathing effort HEART: regular rate & rhythm and no murmurs and no lower extremity edema.  Noted pacemaker in situ ABDOMEN:abdomen is mildly tense, with suprapubic distension, non-tender and normal bowel sounds Musculoskeletal:no cyanosis of digits and no clubbing  PSYCH: alert & oriented x 3 with fluent speech NEURO: no focal motor/sensory deficits  LABORATORY DATA:  I have reviewed the data as listed Lab Results  Component Value Date   WBC 11.1 (H) 11/08/2017   HGB 11.7 (L) 11/08/2017   HCT 37.0 (L) 11/08/2017   MCV 81.7 11/08/2017   PLT 50 (L) 11/08/2017   I have personally reviewed his peripheral blood smear. Repeat CBC count on his most recent stored sample revealed significant thrombocytopenia of 10,000 (according to laboratory report) I see absolute reduced platelet count (agree with interpretation of less than 10,000) with occasional clumping.  I did not see evidence of schistocytes The red blood cell morphology is somewhat normal although mildly hypochromic Normal morphology of white blood cell  RADIOGRAPHIC STUDIES: I have personally reviewed the radiological images as listed and agreed with the findings in the report. Dg Chest 1 View  Result Date: 11/07/2017 CLINICAL DATA:  Respiratory distress EXAM: CHEST  1 VIEW COMPARISON:  Oct 23, 2017 FINDINGS: There is slight scarring in the lower lung zones. There is no edema or consolidation. Heart is mildly enlarged with pulmonary vascularity normal. Pacemaker leads attached right atrium and right ventricle. There is a  prosthetic aortic valve. There is aortic atherosclerosis. No. Bones appear osteoporotic. IMPRESSION: Slight scarring in the lower lung zones. No edema or consolidation. Stable cardiac prominence. Pacemaker leads attached to right atrium right ventricle. Aortic valve replacement present. There is aortic atherosclerosis. Aortic Atherosclerosis (ICD10-I70.0). Electronically Signed   By: Lowella Grip III M.D.    On: 11/07/2017 09:35   Dg Chest 2 View  Result Date: 10/23/2017 CLINICAL DATA:  Syncope. EXAM: CHEST - 2 VIEW COMPARISON:  Radiographs of Oct 14, 2017. FINDINGS: Stable cardiomediastinal silhouette. Aortic valve prosthesis is noted. Left-sided pacemaker is unchanged in position. No pneumothorax is noted. No consolidative process is noted. Small bilateral pleural effusions are noted posteriorly. The visualized skeletal structures are unremarkable. IMPRESSION: Small bilateral pleural effusions. No other significant abnormality seen in the chest. Electronically Signed   By: Marijo Conception, M.D.   On: 10/23/2017 15:25   Dg Chest 2 View  Result Date: 10/14/2017 CLINICAL DATA:  Cough and wheezing due to bronchitis. History of valve replacement and pacemaker placement. Nonsmoker. EXAM: CHEST - 2 VIEW COMPARISON:  PA and lateral chest x-ray of September 06, 2017 FINDINGS: The lungs are well-expanded. There is no focal infiltrate. There is no pleural effusion. The heart and pulmonary vascularity are normal. And prosthetic aortic valve cage is present. The ICD is in stable position. There is calcification in the wall of the aortic arch. The bony thorax exhibits no acute abnormality. IMPRESSION: There is no active cardiopulmonary disease. Electronically Signed   By: David  Martinique M.D.   On: 10/14/2017 15:01   Dg Lumbar Spine Complete  Result Date: 11/07/2017 CLINICAL DATA:  Low back pain.  Worsening for 3 days. EXAM: LUMBAR SPINE - COMPLETE 4+ VIEW COMPARISON:  10/14/2017 FINDINGS: Again noted is some levoscoliosis in the lumbar spine. The curvature is similar to the comparison examination. Again noted is significant anterolisthesis at L5-S1 measuring roughly 1.8 cm and unchanged. This is compatible with grade 2 or grade 3 spondylolisthesis. Chronic disc space narrowing and endplate changes at G2-R4 and L2-L3. Mild retrolisthesis at L2-L3. The vertebral body heights are maintained. Limited evaluation for pars defects  but suspect pars defects at L5 associated with the anterolisthesis. Large amount of stool in the abdomen. IMPRESSION: Stable appearance of the lumbar spine without acute abnormality. Scoliosis with multilevel degenerative disease and significant anterolisthesis at L5-S1. Large stool burden. Electronically Signed   By: Markus Daft M.D.   On: 11/07/2017 09:38   Dg Lumbar Spine Complete  Result Date: 10/14/2017 CLINICAL DATA:  Worsening low back pain for the past 2 weeks with no known injury. EXAM: LUMBAR SPINE - COMPLETE 4+ VIEW COMPARISON:  Abdominal CT scan of March 19, 2016 FINDINGS: The colonic stool burden is increased diffusely. There is mild levocurvature centered at L4-5. There is grade 2 anterolisthesis of L5 with respect to S1 measuring approximately 1.7 cm. There is degenerative disc space narrowing at L1-2 and L2-3 as well as at L5-S1. There are bilateral pars defects at L5. The pedicles appear intact. The observed portions of the sacrum are normal. IMPRESSION: Grade 2 anterolisthesis of L5 with respect S1 secondary to degenerative disc disease and bilateral pars defects. Moderate to severe disc space narrowing at L1-2 and at L2-3 consistent with degenerative change. Electronically Signed   By: David  Martinique M.D.   On: 10/14/2017 14:59   Ct Head Wo Contrast  Result Date: 10/23/2017 CLINICAL DATA:  84 y/o  M; syncope. EXAM: CT HEAD WITHOUT CONTRAST TECHNIQUE: Contiguous axial  images were obtained from the base of the skull through the vertex without intravenous contrast. COMPARISON:  None. FINDINGS: Brain: No evidence of acute infarction, hemorrhage, hydrocephalus, extra-axial collection or mass lesion/mass effect. Mild chronic microvascular ischemic changes and parenchymal volume loss of the brain for age. Small chronic lacunar infarctions within left caudate body and thalamus. Vascular: Calcific atherosclerosis of carotid siphons. No hyperdense vessel identified. Skull: Normal. Negative for  fracture or focal lesion. Sinuses/Orbits: No acute finding. Other: 16 mm lipoma within the left suboccipital myocutaneous junction. IMPRESSION: 1. No acute intracranial abnormality identified. 2. Mild for age chronic microvascular ischemic changes and parenchymal volume loss of the brain. Electronically Signed   By: Kristine Garbe M.D.   On: 10/23/2017 14:30   Ct Lumbar Spine Wo Contrast  Result Date: 11/07/2017 CLINICAL DATA:  Severe low back pain. Abnormal lumbar spine radiographs. EXAM: CT LUMBAR SPINE WITHOUT CONTRAST TECHNIQUE: Multidetector CT imaging of the lumbar spine was performed without intravenous contrast administration. Multiplanar CT image reconstructions were also generated. COMPARISON:  Lumbar spine radiographs 11/07/2017. CT of the abdomen and pelvis 03/19/2016. FINDINGS: Segmentation: 5 non rib-bearing lumbar type vertebral bodies are present. Alignment: Grade 2 anterolisthesis at L5-S1 measures 18 mm, associated with bilateral L5 pars defects. Slight retrolisthesis at L1-2 and L2-3 is stable. Levoconvex curvature of the lumbar spine is centered at L3. There is rightward curvature centered at L1. Vertebrae: Chronic sclerotic changes are again noted on the left at L1-2 and more diffusely at L2-3. Chronic sclerotic changes are present at L5-S1. Vertebral body heights are maintained. No acute fracture is present. Paraspinal and other soft tissues: Bilateral pleural thickening is present. The heart is enlarged. Pacing wires are in place. Visualized abdominal organs are within normal limits. Atherosclerotic calcifications are present in the aorta without aneurysm. No significant adenopathy is present. Disc levels: T12-L1: Negative. L1-2: Asymmetric endplate changes are present on the left. The central canal is patent. Moderate bilateral foraminal stenosis is stable. L2-3: Slight retrolisthesis is present. Facet hypertrophy is asymmetric on the right. This results in moderate right and  mild left subarticular narrowing. Moderate foraminal stenosis is present on the right, unchanged. Mild left foraminal narrowing is present. L3-4: A rightward disc protrusion is present. Mild facet hypertrophy is noted. This results in mild right subarticular narrowing. Mild foraminal narrowing bilaterally is stable. L4-5: Mild disc bulging and left greater than right facet hypertrophy are present. Central canal is patent. Mild foraminal narrowing is worse on the left. L5-S1: Grade 2 anterolisthesis is present. The central canal is patent. Severe left and moderate right foraminal stenosis is present. IMPRESSION: 1. Stable multilevel spondylosis of the lumbar spine. 2. Bilateral pars defects at L5 with grade 2 anterolisthesis measuring up to 18 mm. 3. Severe left and moderate right foraminal stenosis at L5-S1 is similar the prior study. 4. Mild foraminal narrowing at L4-5 is worse on the left. 5. Mild right subarticular narrowing and mild bilateral foraminal stenosis at L3-4 is stable. 6. Moderate right and mild left subarticular and foraminal narrowing at L2-3 is similar the prior study. 7. Moderate bilateral foraminal stenosis at L1-2 is stable. Electronically Signed   By: San Morelle M.D.   On: 11/07/2017 18:25   US Renal  Result Date: 11/07/2017 CLINICAL DATA:  Urinary retention. EXAM: RENAL / URINARY TRACT ULTRASOUND COMPLETE COMPARISON:  None. FINDINGS: Right Kidney: Length: 9.1 cm. Echogenicity within normal limits. No mass or hydronephrosis visualized. Left Kidney: Length: 10.1 cm. Echogenicity within normal limits. No mass or hydronephrosis  visualized. Bladder: Empty and not well visualized on this exam. IMPRESSION: Normal appearance of both kidneys.  No evidence of hydronephrosis. Empty urinary bladder. Electronically Signed   By: Earle Gell M.D.   On: 11/07/2017 13:46    ASSESSMENT & PLAN Acute thrombocytopenia Severe back pain Recent iron deficiency anemia I suspect the cause of the  acute thrombocytopenia is due to consumption from possible retroperitoneal bleeding I plan to repeat coagulation study and CBC I have discussed this with hospitalist who would order noncontrast CT scan to rule out retroperitoneal bleeding I have warned the patient and family members potential need for blood and platelet transfusion, depending on results of his repeat blood count We discussed the risk, benefits, side effects of blood transfusion and of platelets transfusions. Some of the side-effects to be expected including risks of transfusion reactions, chills, infection, syndrome of volume overload and risk of hospitalization from various reasons and the patient's family is willing to proceed if needed.  If CT imaging confirmed retroperitoneal bleeding, I would recommend him to be transferred to intensive care unit  Severe back pain As above, will noncontrast CT scan will be helpful For now, I recommend pain control with pain medicine as needed  Discharge planning The patient is very ill I will return to check on him tomorrow Plan of care is discussed with family members and hospitalist service  All questions were answered.   [name], MD 11/08/2017 12:53 PM

## 2017-11-08 NOTE — Progress Notes (Signed)
PT Cancellation Note  Patient Details Name: Ryan [name] MRN: [id number] DOB: Sep 05, 1929   Cancelled Treatment:    Reason Eval/Treat Not Completed: Pain limiting ability to participate. Patient declining out of bed mobility secondary to back pain. Per RN, patient had previously received pain medication. Will follow.  Ellamae Sia, PT, DPT Acute Rehabilitation Services  Pager: [phone]    Willy Eddy 11/08/2017, 12:25 PM

## 2017-11-08 NOTE — Progress Notes (Addendum)
PROGRESS NOTE    Ryan [name]  PJA:[id number] DOB: 1930-04-08 DOA: 11/07/2017 PCP: Dorothyann Peng, NP    Brief Narrative:  Ryan [name] is a very pleasant 84 y.o. male with medical history significant for aortic valve stenosis status post TAVR, cardiomyopathy, A. Fib, CHF, chronic low back pain,carotid artery disease status post bilateral CEA, CAD status postPTCA 2017, hypertension since emergency Department chief complaint persistentback pain. [facility] are asked to admit  Information is obtained from the patient the chart and his wife and daughter who at the bedside. 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 He was started on prednisone and spironolactone. He was discharged home and doing better. He states 3 days ago he developed agile worsening left low back pain.  She states he took several doses of hydrocodone without improvement. He describes the pain as sharp intermittent with spasms     Assessment & Plan:   Principal Problem:   Intractable back pain Active Problems:   Severe aortic stenosis   PAF (paroxysmal atrial fibrillation) (HCC)   Chronic combined systolic (congestive) and diastolic (congestive) heart failure (HCC)   CAD (coronary artery disease), native coronary artery   Amiodarone pulmonary toxicity   Elevated troponin   Urinary retention   Thrombocytopenia (HCC)   1-Intractable Back pain;  CT showed spondylosis, mild to moderate multilevel foraminal stenosis, pars defect L 4-5.  He was started on baclofen, IV morphine, and vicodin, which has not been helping/.  Will try very los dose dilaudid, and flexeril instead of baclofen. Will do oral percocet.  Due to thrombocytopenia, will rule out infection, will get blood culture.  Neurosurgery has been consulted, they recommend medical management and no need to do CT myelogram.  -also oncology was consulted, due to thrombocytopenia and back pain, will do CT  abdomen pelvis to rule out retroperitoneal hematoma.  -CT abdomen negative for retroperitoneal bleed. Stool through the colon, relative transition point, no obstruction. Discussed with radiologist transition point could be physiologic. Discussed with family he has prior history of adhesions, prior sx. I also spoke with Dr Alvy Bimler, she wonder about ischemic bowel. I will check lactic acid. Mild inflammatory changes about kidneys and mesenteric could be related to edema. Will check urine culture.   2-Thrombocytopenia;  Peripheral smear ordered.  I reviewed his medications, no obvious med could cause thrombocytopenia.  Dr Lottie Rater consulted/.   3-Mild elevation of troponin; flat  He denies chest pain.  EKG no significant changes.  Suspect stress related to acute illness.   4-Chronic combine systolic Diastolic HF;  Appears compensated.  Poor oral intake. Hold spironolactone.  If he doesn't eat, might need low rate fluid by 6-10  5-Paroxysmal A fib;  Hold eliquis due to thrombocytopenia, and rule out retroperitoneal bleed.   6-Urine retention; he has been able to urinate.   7-Constipation; Miralax, Senakot , and PRN dulcolax suppository.   DVT prophylaxis: scd Code Status: full code.  Family Communication: care discussed with daughter, son in law and patient wife.  Disposition Plan: remain inpatient for pain controlled.   Consultants:   Oncology   Neurosurgery    Procedures: none   Antimicrobials:     Subjective: He is severe back pain, having muscle spasms. He appears in distress due to back pain.  Morphine, Vicodin, baclofen not helping.  No BM since Friday, passing gas. He denies abdominal or chest pain.   Objective: Vitals:   11/07/17 1701 11/07/17 1732 11/07/17 2310 11/08/17 0555  BP:  Marland Kitchen)  145/70 (!) 145/91 (!) 161/73  Pulse: 84 79 74 61  Resp: (!) 21 18 16 18   Temp:  98.7 F (37.1 C) 98.5 F (36.9 C) 97.9 F (36.6 C)  TempSrc:  Oral Oral Oral  SpO2: 91%  94% 91% 96%  Weight:        Intake/Output Summary (Last 24 hours) at 11/08/2017 0730 Last data filed at 11/08/2017 1696 Gross per 24 hour  Intake 0 ml  Output 600 ml  Net -600 ml   Filed Weights   11/07/17 0757  Weight: 63.5 kg (140 lb)    Examination:  General exam: appears in severe pain.  Respiratory system: Clear to auscultation. Respiratory effort normal. Cardiovascular system: S1 & S2 heard, RRR. No JVD, murmurs,Trace edema LE>  Gastrointestinal system: Abdomen is mildly distended, soft and nontender. No organomegaly or masses felt. Normal bowel sounds heard. Central nervous system: Alert and oriented.  Extremities: Symmetric 5 x 5 power lower extremities. . Skin: No rashes, lesions or ulcers Psychiatry: Judgement and insight appear normal. Mood & affect appropriate.     Data Reviewed: I have personally reviewed following labs and imaging studies  CBC: Recent Labs  Lab 11/07/17 0849 11/08/17 0231  WBC 11.3* 11.1*  NEUTROABS 9.3*  --   HGB 12.3* 11.7*  HCT 39.5 37.0*  MCV 82.3 81.7  PLT 60* 50*   Basic Metabolic Panel: Recent Labs  Lab 11/07/17 0849 11/08/17 0231  NA 141 139  K 4.0 4.1  CL 107 103  CO2 25 27  GLUCOSE 97 98  BUN 20 17  CREATININE 1.18 1.03  CALCIUM 8.7* 8.7*   GFR: Estimated Creatinine Clearance: 45.6 mL/min (by C-G formula based on SCr of 1.03 mg/dL). Liver Function Tests: Recent Labs  Lab 11/07/17 0849  AST 48*  ALT 49  ALKPHOS 90  BILITOT 0.9  PROT 6.0*  ALBUMIN 3.0*   No results for input(s): LIPASE, AMYLASE in the last 168 hours. No results for input(s): AMMONIA in the last 168 hours. Coagulation Profile: Recent Labs  Lab 11/07/17 0849 11/08/17 0231  INR 1.33 1.30   Cardiac Enzymes: Recent Labs  Lab 11/07/17 0849 11/07/17 1227 11/07/17 1834 11/08/17 0231  TROPONINI 0.20* 0.09* 0.08* 0.08*   BNP (last 3 results) Recent Labs    10/20/17 1058  PROBNP 146.0*   HbA1C: No results for input(s): HGBA1C in the  last 72 hours. CBG: No results for input(s): GLUCAP in the last 168 hours. Lipid Profile: No results for input(s): CHOL, HDL, LDLCALC, TRIG, CHOLHDL, LDLDIRECT in the last 72 hours. Thyroid Function Tests: Recent Labs    11/07/17 1834  TSH 2.060   Anemia Panel: No results for input(s): VITAMINB12, FOLATE, FERRITIN, TIBC, IRON, RETICCTPCT in the last 72 hours. Sepsis Labs: No results for input(s): PROCALCITON, LATICACIDVEN in the last 168 hours.  No results found for this or any previous visit (from the past 240 hour(s)).       Radiology Studies: Dg Chest 1 View  Result Date: 11/07/2017 CLINICAL DATA:  Respiratory distress EXAM: CHEST  1 VIEW COMPARISON:  Oct 23, 2017 FINDINGS: There is slight scarring in the lower lung zones. There is no edema or consolidation. Heart is mildly enlarged with pulmonary vascularity normal. Pacemaker leads attached right atrium and right ventricle. There is a prosthetic aortic valve. There is aortic atherosclerosis. No. Bones appear osteoporotic. IMPRESSION: Slight scarring in the lower lung zones. No edema or consolidation. Stable cardiac prominence. Pacemaker leads attached to right atrium right ventricle. Aortic  valve replacement present. There is aortic atherosclerosis. Aortic Atherosclerosis (ICD10-I70.0). Electronically Signed   By: Lowella Grip III M.D.   On: 11/07/2017 09:35   Dg Lumbar Spine Complete  Result Date: 11/07/2017 CLINICAL DATA:  Low back pain.  Worsening for 3 days. EXAM: LUMBAR SPINE - COMPLETE 4+ VIEW COMPARISON:  10/14/2017 FINDINGS: Again noted is some levoscoliosis in the lumbar spine. The curvature is similar to the comparison examination. Again noted is significant anterolisthesis at L5-S1 measuring roughly 1.8 cm and unchanged. This is compatible with grade 2 or grade 3 spondylolisthesis. Chronic disc space narrowing and endplate changes at V6-H2 and L2-L3. Mild retrolisthesis at L2-L3. The vertebral body heights are  maintained. Limited evaluation for pars defects but suspect pars defects at L5 associated with the anterolisthesis. Large amount of stool in the abdomen. IMPRESSION: Stable appearance of the lumbar spine without acute abnormality. Scoliosis with multilevel degenerative disease and significant anterolisthesis at L5-S1. Large stool burden. Electronically Signed   By: Markus Daft M.D.   On: 11/07/2017 09:38   Ct Lumbar Spine Wo Contrast  Result Date: 11/07/2017 CLINICAL DATA:  Severe low back pain. Abnormal lumbar spine radiographs. EXAM: CT LUMBAR SPINE WITHOUT CONTRAST TECHNIQUE: Multidetector CT imaging of the lumbar spine was performed without intravenous contrast administration. Multiplanar CT image reconstructions were also generated. COMPARISON:  Lumbar spine radiographs 11/07/2017. CT of the abdomen and pelvis 03/19/2016. FINDINGS: Segmentation: 5 non rib-bearing lumbar type vertebral bodies are present. Alignment: Grade 2 anterolisthesis at L5-S1 measures 18 mm, associated with bilateral L5 pars defects. Slight retrolisthesis at L1-2 and L2-3 is stable. Levoconvex curvature of the lumbar spine is centered at L3. There is rightward curvature centered at L1. Vertebrae: Chronic sclerotic changes are again noted on the left at L1-2 and more diffusely at L2-3. Chronic sclerotic changes are present at L5-S1. Vertebral body heights are maintained. No acute fracture is present. Paraspinal and other soft tissues: Bilateral pleural thickening is present. The heart is enlarged. Pacing wires are in place. Visualized abdominal organs are within normal limits. Atherosclerotic calcifications are present in the aorta without aneurysm. No significant adenopathy is present. Disc levels: T12-L1: Negative. L1-2: Asymmetric endplate changes are present on the left. The central canal is patent. Moderate bilateral foraminal stenosis is stable. L2-3: Slight retrolisthesis is present. Facet hypertrophy is asymmetric on the right.  This results in moderate right and mild left subarticular narrowing. Moderate foraminal stenosis is present on the right, unchanged. Mild left foraminal narrowing is present. L3-4: A rightward disc protrusion is present. Mild facet hypertrophy is noted. This results in mild right subarticular narrowing. Mild foraminal narrowing bilaterally is stable. L4-5: Mild disc bulging and left greater than right facet hypertrophy are present. Central canal is patent. Mild foraminal narrowing is worse on the left. L5-S1: Grade 2 anterolisthesis is present. The central canal is patent. Severe left and moderate right foraminal stenosis is present. IMPRESSION: 1. Stable multilevel spondylosis of the lumbar spine. 2. Bilateral pars defects at L5 with grade 2 anterolisthesis measuring up to 18 mm. 3. Severe left and moderate right foraminal stenosis at L5-S1 is similar the prior study. 4. Mild foraminal narrowing at L4-5 is worse on the left. 5. Mild right subarticular narrowing and mild bilateral foraminal stenosis at L3-4 is stable. 6. Moderate right and mild left subarticular and foraminal narrowing at L2-3 is similar the prior study. 7. Moderate bilateral foraminal stenosis at L1-2 is stable. Electronically Signed   By: San Morelle M.D.   On: 11/07/2017  18:25   US Renal  Result Date: 11/07/2017 CLINICAL DATA:  Urinary retention. EXAM: RENAL / URINARY TRACT ULTRASOUND COMPLETE COMPARISON:  None. FINDINGS: Right Kidney: Length: 9.1 cm. Echogenicity within normal limits. No mass or hydronephrosis visualized. Left Kidney: Length: 10.1 cm. Echogenicity within normal limits. No mass or hydronephrosis visualized. Bladder: Empty and not well visualized on this exam. IMPRESSION: Normal appearance of both kidneys.  No evidence of hydronephrosis. Empty urinary bladder. Electronically Signed   By: Earle Gell M.D.   On: 11/07/2017 13:46        Scheduled Meds: . atorvastatin  20 mg Oral Daily  . budesonide  0.5 mg  Nebulization Daily  . latanoprost  1 drop Both Eyes QHS  . levothyroxine  12.5 mcg Oral QAC breakfast  . methocarbamol  500 mg Oral TID  . metoprolol succinate  25 mg Oral Daily  . mometasone-formoterol  2 puff Inhalation BID  . predniSONE  40 mg Oral Q breakfast  . senna-docusate  1 tablet Oral BID  . vitamin B-12  500 mcg Oral Daily   Continuous Infusions:   LOS: 0 days    Time spent: 35 minutes.     Elmarie Shiley, MD [facility] Pager [phone]  If 7PM-7AM, please contact night-coverage [email or web address] Password [email or web address] 11/08/2017, 7:30 AM

## 2017-11-09 ENCOUNTER — Encounter (HOSPITAL_COMMUNITY): Payer: Self-pay | Admitting: General Practice

## 2017-11-09 DIAGNOSIS — Z9981 Dependence on supplemental oxygen: Secondary | ICD-10-CM | POA: Diagnosis not present

## 2017-11-09 DIAGNOSIS — A4181 Sepsis due to Enterococcus: Secondary | ICD-10-CM | POA: Diagnosis not present

## 2017-11-09 DIAGNOSIS — R7881 Bacteremia: Secondary | ICD-10-CM | POA: Diagnosis not present

## 2017-11-09 DIAGNOSIS — I2584 Coronary atherosclerosis due to calcified coronary lesion: Secondary | ICD-10-CM | POA: Diagnosis present

## 2017-11-09 DIAGNOSIS — I350 Nonrheumatic aortic (valve) stenosis: Secondary | ICD-10-CM | POA: Diagnosis not present

## 2017-11-09 DIAGNOSIS — I110 Hypertensive heart disease with heart failure: Secondary | ICD-10-CM | POA: Diagnosis present

## 2017-11-09 DIAGNOSIS — T826XXD Infection and inflammatory reaction due to cardiac valve prosthesis, subsequent encounter: Secondary | ICD-10-CM | POA: Diagnosis not present

## 2017-11-09 DIAGNOSIS — N179 Acute kidney failure, unspecified: Secondary | ICD-10-CM | POA: Diagnosis present

## 2017-11-09 DIAGNOSIS — J9611 Chronic respiratory failure with hypoxia: Secondary | ICD-10-CM | POA: Diagnosis present

## 2017-11-09 DIAGNOSIS — R279 Unspecified lack of coordination: Secondary | ICD-10-CM | POA: Diagnosis not present

## 2017-11-09 DIAGNOSIS — M4640 Discitis, unspecified, site unspecified: Secondary | ICD-10-CM | POA: Diagnosis present

## 2017-11-09 DIAGNOSIS — R627 Adult failure to thrive: Secondary | ICD-10-CM | POA: Diagnosis present

## 2017-11-09 DIAGNOSIS — D72829 Elevated white blood cell count, unspecified: Secondary | ICD-10-CM

## 2017-11-09 DIAGNOSIS — I272 Pulmonary hypertension, unspecified: Secondary | ICD-10-CM | POA: Diagnosis present

## 2017-11-09 DIAGNOSIS — Z743 Need for continuous supervision: Secondary | ICD-10-CM | POA: Diagnosis not present

## 2017-11-09 DIAGNOSIS — I080 Rheumatic disorders of both mitral and aortic valves: Secondary | ICD-10-CM | POA: Diagnosis not present

## 2017-11-09 DIAGNOSIS — I5042 Chronic combined systolic (congestive) and diastolic (congestive) heart failure: Secondary | ICD-10-CM | POA: Diagnosis not present

## 2017-11-09 DIAGNOSIS — I480 Paroxysmal atrial fibrillation: Secondary | ICD-10-CM | POA: Diagnosis not present

## 2017-11-09 DIAGNOSIS — B952 Enterococcus as the cause of diseases classified elsewhere: Secondary | ICD-10-CM

## 2017-11-09 DIAGNOSIS — J984 Other disorders of lung: Secondary | ICD-10-CM | POA: Diagnosis not present

## 2017-11-09 DIAGNOSIS — I471 Supraventricular tachycardia: Secondary | ICD-10-CM | POA: Diagnosis present

## 2017-11-09 DIAGNOSIS — M48061 Spinal stenosis, lumbar region without neurogenic claudication: Secondary | ICD-10-CM | POA: Diagnosis present

## 2017-11-09 DIAGNOSIS — M545 Low back pain: Secondary | ICD-10-CM | POA: Diagnosis not present

## 2017-11-09 DIAGNOSIS — R935 Abnormal findings on diagnostic imaging of other abdominal regions, including retroperitoneum: Secondary | ICD-10-CM

## 2017-11-09 DIAGNOSIS — M549 Dorsalgia, unspecified: Secondary | ICD-10-CM | POA: Diagnosis not present

## 2017-11-09 DIAGNOSIS — I340 Nonrheumatic mitral (valve) insufficiency: Secondary | ICD-10-CM | POA: Diagnosis not present

## 2017-11-09 DIAGNOSIS — R339 Retention of urine, unspecified: Secondary | ICD-10-CM | POA: Diagnosis present

## 2017-11-09 DIAGNOSIS — I38 Endocarditis, valve unspecified: Secondary | ICD-10-CM | POA: Diagnosis not present

## 2017-11-09 DIAGNOSIS — T826XXA Infection and inflammatory reaction due to cardiac valve prosthesis, initial encounter: Secondary | ICD-10-CM | POA: Diagnosis not present

## 2017-11-09 DIAGNOSIS — I330 Acute and subacute infective endocarditis: Secondary | ICD-10-CM | POA: Diagnosis not present

## 2017-11-09 DIAGNOSIS — I429 Cardiomyopathy, unspecified: Secondary | ICD-10-CM | POA: Diagnosis present

## 2017-11-09 DIAGNOSIS — D696 Thrombocytopenia, unspecified: Secondary | ICD-10-CM | POA: Diagnosis not present

## 2017-11-09 DIAGNOSIS — M47816 Spondylosis without myelopathy or radiculopathy, lumbar region: Secondary | ICD-10-CM | POA: Diagnosis present

## 2017-11-09 DIAGNOSIS — I495 Sick sinus syndrome: Secondary | ICD-10-CM | POA: Diagnosis present

## 2017-11-09 DIAGNOSIS — I2510 Atherosclerotic heart disease of native coronary artery without angina pectoris: Secondary | ICD-10-CM | POA: Diagnosis not present

## 2017-11-09 DIAGNOSIS — E860 Dehydration: Secondary | ICD-10-CM | POA: Diagnosis present

## 2017-11-09 DIAGNOSIS — Y831 Surgical operation with implant of artificial internal device as the cause of abnormal reaction of the patient, or of later complication, without mention of misadventure at the time of the procedure: Secondary | ICD-10-CM | POA: Diagnosis present

## 2017-11-09 DIAGNOSIS — D6959 Other secondary thrombocytopenia: Secondary | ICD-10-CM | POA: Diagnosis present

## 2017-11-09 DIAGNOSIS — E785 Hyperlipidemia, unspecified: Secondary | ICD-10-CM | POA: Diagnosis present

## 2017-11-09 DIAGNOSIS — I5022 Chronic systolic (congestive) heart failure: Secondary | ICD-10-CM | POA: Diagnosis not present

## 2017-11-09 DIAGNOSIS — Z79899 Other long term (current) drug therapy: Secondary | ICD-10-CM

## 2017-11-09 DIAGNOSIS — R9389 Abnormal findings on diagnostic imaging of other specified body structures: Secondary | ICD-10-CM | POA: Diagnosis not present

## 2017-11-09 DIAGNOSIS — Z8249 Family history of ischemic heart disease and other diseases of the circulatory system: Secondary | ICD-10-CM

## 2017-11-09 DIAGNOSIS — Z888 Allergy status to other drugs, medicaments and biological substances status: Secondary | ICD-10-CM

## 2017-11-09 LAB — COMPREHENSIVE METABOLIC PANEL
ALT: 41 U/L (ref 17–63)
AST: 41 U/L (ref 15–41)
Albumin: 2.8 g/dL — ABNORMAL LOW (ref 3.5–5.0)
Alkaline Phosphatase: 87 U/L (ref 38–126)
Anion gap: 9 (ref 5–15)
BILIRUBIN TOTAL: 0.9 mg/dL (ref 0.3–1.2)
BUN: 24 mg/dL — AB (ref 6–20)
CHLORIDE: 103 mmol/L (ref 101–111)
CO2: 26 mmol/L (ref 22–32)
CREATININE: 1.13 mg/dL (ref 0.61–1.24)
Calcium: 8.9 mg/dL (ref 8.9–10.3)
GFR, EST NON AFRICAN AMERICAN: 57 mL/min — AB (ref >60)
Glucose, Bld: 149 mg/dL — ABNORMAL HIGH (ref 65–99)
POTASSIUM: 4.4 mmol/L (ref 3.5–5.1)
Sodium: 138 mmol/L (ref 135–145)
TOTAL PROTEIN: 5.9 g/dL — AB (ref 6.5–8.1)

## 2017-11-09 LAB — C-REACTIVE PROTEIN: CRP: 16.2 mg/dL — ABNORMAL HIGH (ref <1.0)

## 2017-11-09 LAB — BLOOD CULTURE ID PANEL (REFLEXED)
Acinetobacter baumannii: NOT DETECTED
CANDIDA KRUSEI: NOT DETECTED
CANDIDA PARAPSILOSIS: NOT DETECTED
CANDIDA TROPICALIS: NOT DETECTED
Candida albicans: NOT DETECTED
Candida glabrata: NOT DETECTED
ESCHERICHIA COLI: NOT DETECTED
Enterobacter cloacae complex: NOT DETECTED
Enterobacteriaceae species: NOT DETECTED
Enterococcus species: DETECTED — AB
Haemophilus influenzae: NOT DETECTED
KLEBSIELLA OXYTOCA: NOT DETECTED
KLEBSIELLA PNEUMONIAE: NOT DETECTED
LISTERIA MONOCYTOGENES: NOT DETECTED
Neisseria meningitidis: NOT DETECTED
PROTEUS SPECIES: NOT DETECTED
Pseudomonas aeruginosa: NOT DETECTED
SERRATIA MARCESCENS: NOT DETECTED
STAPHYLOCOCCUS AUREUS BCID: NOT DETECTED
STAPHYLOCOCCUS SPECIES: NOT DETECTED
STREPTOCOCCUS PNEUMONIAE: NOT DETECTED
Streptococcus agalactiae: NOT DETECTED
Streptococcus pyogenes: NOT DETECTED
Streptococcus species: NOT DETECTED
Vancomycin resistance: NOT DETECTED

## 2017-11-09 LAB — CBC
HCT: 39.8 % (ref 39.0–52.0)
Hemoglobin: 12.6 g/dL — ABNORMAL LOW (ref 13.0–17.0)
MCH: 26 pg (ref 26.0–34.0)
MCHC: 31.7 g/dL (ref 30.0–36.0)
MCV: 82.2 fL (ref 78.0–100.0)
Platelets: 54 10*3/uL — ABNORMAL LOW (ref 150–400)
RBC: 4.84 MIL/uL (ref 4.22–5.81)
RDW: 19.2 % — ABNORMAL HIGH (ref 11.5–15.5)
WBC: 17.7 10*3/uL — ABNORMAL HIGH (ref 4.0–10.5)

## 2017-11-09 LAB — URINE CULTURE: Culture: <10000 — AB

## 2017-11-09 LAB — PROCALCITONIN: Procalcitonin: 0.47 ng/mL

## 2017-11-09 LAB — SEDIMENTATION RATE: Sed Rate: 29 mm/h — ABNORMAL HIGH (ref 0–16)

## 2017-11-09 MED ORDER — AMPICILLIN SODIUM 2 G IJ SOLR
2.0000 g | Freq: Four times a day (QID) | INTRAMUSCULAR | Status: DC
  Administered 2017-11-09 – 2017-11-14 (×21): 2 g via INTRAVENOUS
  Filled 2017-11-09 (×23): qty 2000

## 2017-11-09 MED ORDER — SODIUM CHLORIDE 0.9 % IV SOLN
INTRAVENOUS | Status: DC
  Administered 2017-11-09 – 2017-11-10 (×2): via INTRAVENOUS

## 2017-11-09 MED ORDER — METOPROLOL TARTRATE 5 MG/5ML IV SOLN: 5.0000 mg | Freq: Once | INTRAVENOUS | Status: DC

## 2017-11-09 MED ORDER — PREDNISONE 20 MG PO TABS
40.0000 mg | ORAL_TABLET | Freq: Every day | ORAL | Status: DC
  Administered 2017-11-10 – 2017-11-14 (×5): 40 mg via ORAL
  Filled 2017-11-09 (×5): qty 2

## 2017-11-09 MED ORDER — METOPROLOL TARTRATE 5 MG/5ML IV SOLN
INTRAVENOUS | Status: AC
  Filled 2017-11-09: qty 5

## 2017-11-09 NOTE — Progress Notes (Signed)
[facility] Infusion Coordinator will follow pt with ID team to support pharmacy services for home IV ABX as ordered at DC.   If patient discharges after hours, please call [phone].   Ryan [name] 11/09/2017, 1:36 PM

## 2017-11-09 NOTE — Progress Notes (Signed)
PT evaluation was done with pt demonstrating difficulty standing on walker and pain to control his back with any movement.  Worst was to come up to stand but with cues for hand placement and support was better able to transition to safe standing with support.  Pt is appropriate for SNF care and was vetted by MD coming by to talk with him about his labs and findings.  Will focus on progression of safety and balance with RW, and will expect him to have inpt support to finish rehab before going home.    11/09/17 1200  PT Visit Information  Last PT Received On 11/09/17  Assistance Needed +2  History of Present Illness 84yo male who was admitted for intractible back pain, has grade 2 L5-S1 anterolisthesis and stenosis, L5 pars defect, L3 center of levoconvex curvature, foraminal narrowing mult levels, L5 pars defect.  PMHx:  TAVR, bladder CA, hypoxia,   Precautions  Precautions Fall (telemetry)  Precaution Comments watch O2   Restrictions  Weight Bearing Restrictions No  Home Living  Family/patient expects to be discharged to: [place] other  Available Help at Discharge Family;Available 24 hours/day  Type of Home House  Home Access Stairs to enter  Entrance Stairs-Number of Steps 3  Entrance Stairs-Rails Right  Home Layout One level  Tax adviser - 2 wheels  Prior Function  Level of Independence Independent with assistive device(s)  Communication  Communication No difficulties  Pain Assessment  Pain Assessment Faces  Faces Pain Scale 8  Pain Location back with some of his steps on RW  Pain Descriptors / Indicators Tightness;Tender;Spasm  Pain Intervention(s) Limited activity within patient's tolerance;Monitored during session;Premedicated before session;Repositioned;Heat applied  Cognition  Arousal/Alertness Awake/alert  Behavior During Therapy WFL for tasks assessed/performed  Overall  Cognitive Status Within Functional Limits for tasks assessed  General Comments pt had moist heat on lumbar spine with good tolerance for sitting as a result  Upper Extremity Assessment  Upper Extremity Assessment Overall WFL for tasks assessed  Lower Extremity Assessment  Lower Extremity Assessment Generalized weakness  Cervical / Trunk Assessment  Cervical / Trunk Assessment Other exceptions (see history for degenerative conditions)  Cervical / Trunk Exceptions anterolisthesis, pars defect  Bed Mobility  General bed mobility comments pt was up in [facility] when PT arrived  Transfers  Overall transfer level Needs assistance  Equipment used Rolling walker (2 wheeled)  Transfers Sit to/from Stand  Sit to Stand Mod assist  General transfer comment mod assist due to pain in spine, cued hand placement to assist him quickly to minimize increase of pain  Ambulation/Gait  Ambulation/Gait assistance Min assist;+2 physical assistance;+2 safety/equipment (daughter close with chair due to his back pain)  Ambulation Distance (Feet) 60 Feet  Assistive device Rolling walker (2 wheeled)  Gait Pattern/deviations Step-through pattern;Decreased step length - right;Decreased step length - left;Decreased stride length;Trunk flexed;Wide base of support;Antalgic  General Gait Details limited trip due to back pain and fatigue  Gait velocity reduced  Gait velocity interpretation <1.8 ft/sec, indicate of risk for recurrent falls  Balance  Overall balance assessment Needs assistance  Sitting-balance support Feet supported;Bilateral upper extremity supported  Sitting balance-Leahy Scale Fair  Standing balance support Bilateral upper extremity supported;During functional activity  Standing balance-Leahy Scale Poor  Exercises  Exercises Other exercises (LE strength was 3+ with pain to flex hips actively)  PT - End of Session  Equipment Utilized During Treatment Gait belt;Oxygen (2L O2 via  nasal cannula)  Activity  Tolerance Patient tolerated treatment well;Patient limited by fatigue;Patient limited by pain  Patient left in chair;with call bell/phone within reach;with chair alarm set;with family/visitor present;with nursing/sitter in room  Nurse Communication Mobility status;Patient requests pain meds  PT Assessment  PT Recommendation/Assessment Patient needs continued PT services  PT Visit Diagnosis Unsteadiness on feet (R26.81);Muscle weakness (generalized) (M62.81);Difficulty in walking, not elsewhere classified (R26.2);Pain  Pain - Right/Left  (back)  Pain - part of body  (back)  PT Problem List Decreased strength;Decreased range of motion;Decreased activity tolerance;Decreased balance;Decreased mobility;Decreased coordination;Decreased knowledge of use of DME;Decreased safety awareness;Cardiopulmonary status limiting activity;Pain  Barriers to Discharge Decreased caregiver support  Barriers to Discharge Comments wife is not in good health and cannot assist him  PT Plan  PT Frequency (ACUTE ONLY) Min 2X/week  PT Treatment/Interventions (ACUTE ONLY) DME instruction;Gait training;Functional mobility training;Therapeutic activities;Therapeutic exercise;Balance training;Neuromuscular re-education;Cognitive remediation;Wheelchair mobility training  AM-PAC PT "6 Clicks" Daily Activity Outcome Measure  Difficulty turning over in bed (including adjusting bedclothes, sheets and blankets)? 1  Difficulty moving from lying on back to sitting on the side of the bed?  1  Difficulty sitting down on and standing up from a chair with arms (e.g., wheelchair, bedside commode, etc,.)? 1  Help needed moving to and from a bed to chair (including a wheelchair)? 2  Help needed walking in hospital room? 2  Help needed climbing 3-5 steps with a railing?  1  6 Click Score 8  Mobility G Code  CM  PT Recommendation  Follow Up Recommendations SNF  PT equipment None recommended by PT  Individuals Consulted  Consulted and  Agree with Results and Recommendations Patient;Family member/caregiver  Family Member Consulted wife and daughter  Acute Rehab PT Goals  Patient Stated Goal to go home   PT Goal Formulation With patient/family  Time For Goal Achievement 11/23/17  Potential to Achieve Goals Good  PT Time Calculation  PT Start Time (ACUTE ONLY) 0942  PT Stop Time (ACUTE ONLY) 1010  PT Time Calculation (min) (ACUTE ONLY) 28 min  PT G-Codes **NOT FOR INPATIENT CLASS**  Functional Assessment Tool Used AM-PAC 6 Clicks Basic Mobility  PT General Charges  $$ ACUTE PT VISIT 1 Visit  PT Evaluation  $PT Eval Moderate Complexity 1 Mod  PT Treatments  $Gait Training 8-22 mins  Written Expression  Dominant Hand Right    Mee Hives, PT MS Acute Rehab Dept. Number: [place] and [place]

## 2017-11-09 NOTE — Consult Note (Addendum)
[facility]    Date of Admission:  11/07/2017   Total days of antibiotics 0        Day 0 ampicillin                Reason for Consult: Enterococcal bacteremia     Referring Provider: CHAMP  Primary Care Provider: Regalado   Assessment: 84 y.o. male admitted with acute on chronic worsening low back pain. He was found to have leukocytosis on admission - blood cultures subsequently positive 1/2 sets for enterococcus based on rapid ID test - No vancomycin resistance detected. Coccobacillis detected in second set in aerobic bottle which may be contaminant - will follow. Potential sources ?discitis, endocarditis, urinary. Follow urine culture. Would obtain TEE for him to evaluate. His family are in agreement after discussion. If discitis (not suggested on MRI but has had significant acute on chronic worsening of back pain in the setting of bacteremia) no indications for neurosurgery at this time and will be followed up on outpatient. Thrombocytopenia likely reactive in setting of infection. Discussed with Dr Tyrell Antonio.   Plan: 1. Start ampicillin 2g IV q6h 2. Hold on further antibiotic pending TEE 3. Would check TEE  4. Repeat blood cultures tomorrow morning 5. Hold on PICC line please     Principal Problem:   Enterococcal bacteremia Active Problems:   Severe aortic stenosis   PAF (paroxysmal atrial fibrillation) (HCC)   Chronic combined systolic (congestive) and diastolic (congestive) heart failure (HCC)   CAD (coronary artery disease), native coronary artery   Amiodarone pulmonary toxicity   Intractable back pain   Elevated troponin   Urinary retention   Thrombocytopenia ([place])   . atorvastatin  20 mg Oral Daily  . budesonide  0.5 mg Nebulization Daily  . famotidine  20 mg Oral Daily  . feeding supplement (ENSURE ENLIVE)  237 mL Oral BID BM  . latanoprost  1 drop Both Eyes QHS  . levothyroxine  12.5 mcg Oral QAC breakfast  . lidocaine  1 patch  Transdermal Q24H  . metoprolol succinate  25 mg Oral Daily  . mometasone-formoterol  2 puff Inhalation BID  . polyethylene glycol  17 g Oral BID  . predniSONE  40 mg Oral Q breakfast  . vitamin B-12  500 mcg Oral Daily    HPI: Ryan [name] is a 84 y.o. male admitted to the hospital with chief concern of back pain. He does have a significant past medical history including AS s/p TAVR,  Cardiomyopathy s/p PPM, a fib, CHF, chronic low back pain, carotid artery disease s/p CEA, CAD s/p PTCA 2017, HTN.   He presented to the emergency department on 11/07/17 from home after short admission for concern over amiodarone-induced lung toxicity. After discharge from this hospitalization on 5/24 - 5/25 he was doing well up until 3 days prior to admission when he developed worsening left lower back pain. He took a few doses of hydrococodone without improvement. Pain at admission was described to be sharp/stabbing and that straightening his legs or moving aggravates it. He usually is up and down to the bathroom 3-4 times a night but was having difficulty emptying his bladder on admission.   He was found to have slight leukocytosis that worsened 11.1 >> 17.7 which prompted blood and urine cultures to be drawn on 11/08/17. He has been afebrile (although on prednisone) since admission. Blood cultures 1/2 for enterococcus on BCID. Of note he has  a prosthetic stent valve and pacemaker. CT scan detailed below but diffuse degenerative disease with grade 2 spondyloarthritis at L5-S1negative for concern for abscess/discitis although unable to obtain MRI. Unable to get CT myelogram with thrombocytopenia and chronic anticoagulation.   In discussion with he and his wife and daughter in addition to the above history he also had some trouble with alternating constipation/diarrhea. No wounds to speak of aside from bruising.   CT scan lumbar spine  IMPRESSION: 1. Stable multilevel spondylosis of the lumbar spine. 2. 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 3. Severe left and moderate right foraminal stenosis at L5-S1 is similar the prior study. 4. Mild foraminal narrowing at L4-5 is worse on the left. 5. Mild right subarticular narrowing and mild bilateral foraminal stenosis at L3-4 is stable. 6. Moderate right and mild left subarticular and foraminal narrowing at L2-3 is similar the prior study. 7. Moderate bilateral foraminal stenosis at L1-2 is stable.  Review of Systems: Review of Systems  Constitutional: Negative for chills, fever, malaise/fatigue and weight loss.  HENT: Negative for sore throat.        No dental problems  Respiratory: Negative for cough and sputum production.   Cardiovascular: Negative for chest pain and leg swelling.  Gastrointestinal: Positive for constipation and diarrhea. Negative for abdominal pain and vomiting.  Genitourinary: Negative for dysuria and flank pain.       Urinary retention   Musculoskeletal: Positive for back pain (severe and unrelenting ). Negative for joint pain, myalgias and neck pain.  Skin: Negative for rash.  Neurological: Negative for dizziness, tingling and headaches.  Psychiatric/Behavioral: Negative for depression and substance abuse. The patient is not nervous/anxious and does not have insomnia.     Past Medical History:  Diagnosis Date  . Age-related macular degeneration   . Allergic rhinitis   . Amiodarone toxicity   . Aortic valve stenosis    a. s/p TAVR 06/2016.  . Arthritis    "hands, lower back" (04/30/2016)  . Asthma   . Cardiomyopathy ([place])    a. EF declined to 35-40% by echo 09/2017 (previously 50-55% after TAVR).  . Carotid artery obstruction    a. s/p bilat CEA.  . Chronic combined systolic and diastolic CHF (congestive heart failure) ([place])   . Chronic lower back pain   . Coronary arteriosclerosis in native artery    a. 2017 Cardiac catheterization demonstrated worsening CAD with 70% mid LCx,  80% OM1 and 80% mid RCA stenosis. b.  In 11/17, he underwent successful rotational atherectomy and DES to RCA.  . Diverticulitis of colon   . DJD (degenerative joint disease)   . Dyspnea    "constant but the degree changes"  . GERD (gastroesophageal reflux disease)   . History of elevated PSA 05/2009  . History of hiatal hernia   . HLD (hyperlipidemia)   . Hypertension   . Nocturnal hypoxemia   . OSA (obstructive sleep apnea)    intolerant to CPAP  . PAF (paroxysmal atrial fibrillation) ([place])   . Paroxysmal supraventricular tachycardia ([place])   . Pneumonia    "when I was a kid"  . Presence of permanent cardiac pacemaker   . Primary malignant neoplasm of bladder ([place])   . Prostate cancer [facility])     Social History   Tobacco Use  . Smoking status: Never Smoker  . Smokeless tobacco: Never Used  Substance Use Topics  . Alcohol use: No    Alcohol/week: 0.0  oz    Comment: 04/30/2016 "nothing in years"  . Drug use: No    Family History  Problem Relation Age of Onset  . Heart disease Mother   . Heart disease Father    Allergies  Allergen Reactions  . Ciprofloxacin Anaphylaxis  . Hydrochlorothiazide Anaphylaxis  . Sulfa Antibiotics Anaphylaxis  . Ace Inhibitors Cough  . Losartan Cough  . Carvedilol Cough    Pt states it "causes him too cough."  . Lisinopril Cough    Pt reports worsening cough and "feeling wheezy."    OBJECTIVE: Blood pressure (!) 134/56, pulse 64, temperature 98.1 F (36.7 C), temperature source Oral, resp. rate 16, weight 140 lb (63.5 kg), SpO2 96 %.  Physical Exam  Constitutional: He is oriented to person, place, and time.  HENT:  Mouth/Throat: No oral lesions. Normal dentition. No dental caries.  Eyes: No scleral icterus.  Cardiovascular: Normal rate, regular rhythm and normal heart sounds.  Pulmonary/Chest: Effort normal and breath sounds normal.  Abdominal: Soft. He exhibits no distension. There is no tenderness.  Lymphadenopathy:    He has  no cervical adenopathy.  Neurological: He is alert and oriented to person, place, and time.  Skin: Skin is warm and dry. No rash noted.  Vitals reviewed.  Lab Results Lab Results  Component Value Date   WBC 17.7 (H) 11/09/2017   HGB 12.6 (L) 11/09/2017   HCT 39.8 11/09/2017   MCV 82.2 11/09/2017   PLT 54 (L) 11/09/2017    Lab Results  Component Value Date   CREATININE 1.13 11/09/2017   BUN 24 (H) 11/09/2017   NA 138 11/09/2017   K 4.4 11/09/2017   CL 103 11/09/2017   CO2 26 11/09/2017    Lab Results  Component Value Date   ALT 41 11/09/2017   AST 41 11/09/2017   ALKPHOS 87 11/09/2017   BILITOT 0.9 11/09/2017    Sed Rate (mm/hr)  Date Value  11/09/2017 29 (H)  11/03/2017 90 (H)   CRP (mg/dL)  Date Value  11/09/2017 16.2 (H)    Microbiology: BCx 11/08/17 >> 1/2 sets enterococcus   Janene Madeira, MSN, NP-C [facility] [place] Cell: [phone] Pager: [phone]  11/09/2017 10:37 AM

## 2017-11-09 NOTE — Progress Notes (Signed)
PHARMACY - PHYSICIAN COMMUNICATION CRITICAL VALUE ALERT - BLOOD CULTURE IDENTIFICATION (BCID)  Ryan [name] is an 84 y.o. male who presented to [facility] on 11/07/2017 with a chief complaint of back pain  Assessment:  87 YOM being evaluated for back pain, could not do MRI due to presence of pacemaker. Neurosurgery planning myelogram today. Now with 1 of 2 blood cultures growing Enterococcus. The patient is noted to have hx TAVR/pacemaker and is high risk for involvement with these. Also noted thrombocytopenia - most antibiotics have some degree of thrombocytopenia associated with them so this will need to be monitored closely with antibiotic initiation.   Name of physician (or Provider) Contacted: Regalado  Current antibiotics: None  Changes to prescribed antibiotics recommended:  Start Ampicillin 2g IV every 6 hours (renally adjusted) - ID to see later today and make decision on adding synergy  Results for orders placed or performed during the hospital encounter of 11/07/17  Blood Culture ID Panel (Reflexed) (Collected: 11/08/2017  9:27 AM)  Result Value Ref Range   Enterococcus species DETECTED (A) NOT DETECTED   Vancomycin resistance NOT DETECTED NOT DETECTED   Listeria monocytogenes NOT DETECTED NOT DETECTED   Staphylococcus species NOT DETECTED NOT DETECTED   Staphylococcus aureus NOT DETECTED NOT DETECTED   Streptococcus species NOT DETECTED NOT DETECTED   Streptococcus agalactiae NOT DETECTED NOT DETECTED   Streptococcus pneumoniae NOT DETECTED NOT DETECTED   Streptococcus pyogenes NOT DETECTED NOT DETECTED   Acinetobacter baumannii NOT DETECTED NOT DETECTED   Enterobacteriaceae species NOT DETECTED NOT DETECTED   Enterobacter cloacae complex NOT DETECTED NOT DETECTED   Escherichia coli NOT DETECTED NOT DETECTED   Klebsiella oxytoca NOT DETECTED NOT DETECTED   Klebsiella pneumoniae NOT DETECTED NOT DETECTED   Proteus species NOT DETECTED NOT DETECTED   Serratia marcescens  NOT DETECTED NOT DETECTED   Haemophilus influenzae NOT DETECTED NOT DETECTED   Neisseria meningitidis NOT DETECTED NOT DETECTED   Pseudomonas aeruginosa NOT DETECTED NOT DETECTED   Candida albicans NOT DETECTED NOT DETECTED   Candida glabrata NOT DETECTED NOT DETECTED   Candida krusei NOT DETECTED NOT DETECTED   Candida parapsilosis NOT DETECTED NOT DETECTED   Candida tropicalis NOT DETECTED NOT DETECTED    Lawson Radar 11/09/2017  8:43 AM

## 2017-11-09 NOTE — Progress Notes (Signed)
PROGRESS NOTE    Ryan [name]  YNW:[id number] DOB: 1930/02/26 DOA: 11/07/2017 PCP: Ryan Peng, NP    Brief Narrative:  Ryan [name] is a very pleasant 84 y.o. male with medical history significant for aortic valve stenosis status post TAVR, cardiomyopathy, A. Fib, CHF, chronic low back pain,carotid artery disease status post bilateral CEA, CAD status postPTCA 2017, hypertension since emergency Department chief complaint persistentback pain. [facility] are asked to admit  Information is obtained from the patient the chart and his wife and daughter who at the bedside. 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 He was started on prednisone and spironolactone. He was discharged home and doing better. He states 3 days ago he developed agile worsening left low back pain.  She states he took several doses of hydrocodone without improvement. He describes the pain as sharp intermittent with spasms    Assessment & Plan:   Principal Problem:   Intractable back pain Active Problems:   Severe aortic stenosis   PAF (paroxysmal atrial fibrillation) (HCC)   Chronic combined systolic (congestive) and diastolic (congestive) heart failure (HCC)   CAD (coronary artery disease), native coronary artery   Amiodarone pulmonary toxicity   Elevated troponin   Urinary retention   Thrombocytopenia (HCC)   1-Intractable Back pain;  -CT showed spondylosis, mild to moderate multilevel foraminal stenosis, pars defect L 4-5.  -He was started on baclofen, IV morphine, and vicodin, didn't help. Now on low dose dilaudid and flexeril.  -on Prednisone.  -Blood culture positive for enterococcus.  -Neurosurgery has been consulted, they recommend medical management and no need to do CT myelogram.  -CT abdomen negative for retroperitoneal Bleed.  -He was on prednisone for recent prednisone toxicity, he was suppose to be on taper dose. Currently getting 40  mg daily. Will need to taper depending on recommendation by Neurosx, and cardiologist for amiodarone toxicity. -Discussed with Dr Kathyrn [name], unable to rule out discitis with CT spine. Ancillary and secondary test like TAG RBC scan, PET scan, wont change management regarding treatment for discitis. Regarding spinal bleed, patient would have symptoms of cord compression if he  had  large hematoma. We are not able to rule out small hematoma in the spine.  -Dr Kathyrn [name] will see patient again 6-11. -Patient was sitting in recliner today, appears in less pain.  -ESR and CRP elevated.   2-Enterococcus Bacteremia;  One of two blood culture grew enteroccocus.  ID consulted. Source GI, Urine, cardiac.  Patient started on IV ampicillin.  Plan to repeat Blood cultures 6-11.  Cardiology consulted for evaluation of TEE. Patient with pacemaker and recent TAVR.  ID recommend repeating CT spine in 2 weeks.   3-Relative transition point in the sigmoid no evidence of obstruction.  Enterococcus Bacteremia.  -CT abdomen pelvis.  negative for retroperitoneal bleed. Stool through the colon, relative transition point, no obstruction. Discussed with radiologist transition point could be physiologic, vs adhesions. . Discussed with family he has prior history of adhesions, prior sx. I also spoke with Dr. Alvy [name], she wonder about ischemic bowel. lactic acid  negative. Oncology recommend GI consultation.  -evaluated by GI, hold on colonoscopy, evaluate for endocarditis and Discitis.   4-Thrombocytopenia;  Dr Lottie Rater consulted/.  Now that blood cultures are positive, thrombocytopenia could be related to infection. Follow trend.   3-Mild elevation of troponin; flat  He denies chest pain.  EKG no significant changes.  Suspect stress related to acute illness.   4-Chronic combine systolic Diastolic HF;  Appears compensated.  Poor oral intake. Hold spironolactone.    5-Paroxysmal A fib;  Hold eliquis due to  thrombocytopenia.   6-Urine retention; he has been able to urinate.   7-Constipation; Miralax, Senakot , and PRN dulcolax suppository.   8-Chronic Hypoxic Respiratory failure. On Oxygen at home  Recently  diagnosed with amiodarone toxicity. On Prednisone.  Respiratory status stable.     DVT prophylaxis: scd Code Status: full code.  Family Communication: care discussed with daughter, son in law and patient wife.  Disposition Plan: remain inpatient for pain controlled.   Consultants:   Oncology   Neurosurgery    Procedures: none   Antimicrobials:     Subjective: He is sitting in recliner. He report some improvement of back pain, but when it start is bad, shooting pain.  He had BM last night and this morning.  He is mildly tender abdomen.  He is breathing ok.   Objective: Vitals:   11/08/17 1734 11/08/17 2027 11/08/17 2042 11/09/17 0733  BP: 134/69  129/68   Pulse: 60  68   Resp: 16  17   Temp: 97.7 F (36.5 C)  98 F (36.7 C)   TempSrc: Oral  Oral   SpO2: 93% 96% (!) 85% 99%  Weight:        Intake/Output Summary (Last 24 hours) at 11/09/2017 0814 Last data filed at 11/09/2017 0602 Gross per 24 hour  Intake 180 ml  Output 275 ml  Net -95 ml   Filed Weights   11/07/17 0757  Weight: 63.5 kg (140 lb)    Examination:  General exam: NAD.  Respiratory system: Normal respiratory effort, crackles bases.  Cardiovascular system: S 1, S 2 RRR Gastrointestinal system: Abdomen soft, mild tenderness left lower quadrant.  Central nervous system: Alert and oriented.  Extremities: Symmetric power.  Skin: few ecchymosis.  Psychiatry: Mood and affect appropriate     Data Reviewed: I have personally reviewed following labs and imaging studies  CBC: Recent Labs  Lab 11/07/17 0849 11/08/17 0231 11/08/17 1310 11/09/17 0613  WBC 11.3* 11.1* 11.5* 17.7*  NEUTROABS 9.3*  --  9.9*  --   HGB 12.3* 11.7* 12.1* 12.6*  HCT 39.5 37.0* 38.4* 39.8  MCV 82.3 81.7  81.2 82.2  PLT 60* 50* 51* 54*   Basic Metabolic Panel: Recent Labs  Lab 11/07/17 0849 11/08/17 0231 11/09/17 0613  NA 141 139 138  K 4.0 4.1 4.4  CL 107 103 103  CO2 25 27 26   GLUCOSE 97 98 149*  BUN 20 17 24*  CREATININE 1.18 1.03 1.13  CALCIUM 8.7* 8.7* 8.9   GFR: Estimated Creatinine Clearance: 41.6 mL/min (by C-G formula based on SCr of 1.13 mg/dL). Liver Function Tests: Recent Labs  Lab 11/07/17 0849 11/09/17 0613  AST 48* 41  ALT 49 41  ALKPHOS 90 87  BILITOT 0.9 0.9  PROT 6.0* 5.9*  ALBUMIN 3.0* 2.8*   No results for input(s): LIPASE, AMYLASE in the last 168 hours. No results for input(s): AMMONIA in the last 168 hours. Coagulation Profile: Recent Labs  Lab 11/07/17 0849 11/08/17 0231  INR 1.33 1.30   Cardiac Enzymes: Recent Labs  Lab 11/07/17 0849 11/07/17 1227 11/07/17 1834 11/08/17 0231  TROPONINI 0.20* 0.09* 0.08* 0.08*   BNP (last 3 results) Recent Labs    10/20/17 1058  PROBNP 146.0*   HbA1C: No results for input(s): HGBA1C in the last 72 hours. CBG: No results for input(s): GLUCAP in the last 168 hours. Lipid Profile:  No results for input(s): CHOL, HDL, LDLCALC, TRIG, CHOLHDL, LDLDIRECT in the last 72 hours. Thyroid Function Tests: Recent Labs    11/07/17 1834  TSH 2.060   Anemia Panel: No results for input(s): VITAMINB12, FOLATE, FERRITIN, TIBC, IRON, RETICCTPCT in the last 72 hours. Sepsis Labs: Recent Labs  Lab 11/08/17 1720  LATICACIDVEN 1.9    Recent Results (from the past 240 hour(s))  Culture, blood (routine x 2)     Status: None (Preliminary result)   Collection Time: 11/08/17  9:27 AM  Result Value Ref Range Status   Specimen Description BLOOD RIGHT HAND  Final   Special Requests   Final    BOTTLES DRAWN AEROBIC ONLY Blood Culture adequate volume   Culture  Setup Time   Final    GRAM POSITIVE COCCI AEROBIC BOTTLE ONLY Organism ID to follow Performed at [facility], [place] [address] [place],  [place] [address]    Culture [facility] POSITIVE COCCI  Final   Report Status PENDING  Incomplete         Radiology Studies: Ct Abdomen Pelvis Wo Contrast  Result Date: 11/08/2017 CLINICAL DATA:  Abdominal distention.  Leukocytosis. EXAM: CT ABDOMEN AND PELVIS WITHOUT CONTRAST TECHNIQUE: Multidetector CT imaging of the abdomen and pelvis was performed following the standard protocol without IV contrast. COMPARISON:  CT lumbar spine 11/07/2017 FINDINGS: Lower chest: Right greater than left basilar airspace disease is seen present. Small right pleural effusion is present. The heart is enlarged. Pacing wires are present. Coronary artery calcifications are noted. Aortic valve replacement is noted. Hepatobiliary: No focal liver abnormality is seen. No gallstones, gallbladder wall thickening, or biliary dilatation. Pancreas: Unremarkable. No pancreatic ductal dilatation or surrounding inflammatory changes. Spleen: Normal in size without focal abnormality. Adrenals/Urinary Tract: Adrenal glands are normal bilaterally. There is some stranding about both kidneys. No focal mass lesion is present. There is no hydronephrosis. No stones are present. Ureters are within normal limits bilaterally. The urinary bladder is unremarkable. Stomach/Bowel: Stomach and duodenum are within normal limits. The small bowel is unremarkable. The terminal ileum is within normal limits. Moderate stool is present throughout the colon. There is no obstruction. There is transition point along the sigmoid colon which may be due to adhesions. The distal sigmoid colon and rectum are decompressed. Vascular/Lymphatic: Atherosclerotic calcifications are present in the aorta and branch vessels without aneurysm. No significant adenopathy is present. Reproductive: Prostate is unremarkable. Other: No significant effusions are present. No significant ventral hernia is present. Musculoskeletal: Degenerative changes are again noted in the lumbar spine. Bilateral L5  pars defects are present. There is no significant interval change. IMPRESSION: 1. Moderate stool throughout the colon. There is a relative transition point in the sigmoid without obstruction. This could be due to adhesions. 2. No other acute or focal lesion to explain abdominal distention or leukocytosis. 3. Right greater than left basilar airspace disease likely reflects atelectasis. Infection is not excluded on the right. 4. Aortic Atherosclerosis (ICD10-I70.0). No significant aneurysm is present. 5. Coronary artery disease. 6. TAVR 7. Mild inflammatory changes about the kidneys and in the mesentery likely reflects mild edema. Electronically Signed   By: San Morelle M.D.   On: 11/08/2017 15:10   Dg Chest 1 View  Result Date: 11/07/2017 CLINICAL DATA:  Respiratory distress EXAM: CHEST  1 VIEW COMPARISON:  Oct 23, 2017 FINDINGS: There is slight scarring in the lower lung zones. There is no edema or consolidation. Heart is mildly enlarged with pulmonary vascularity normal. Pacemaker leads  attached right atrium and right ventricle. There is a prosthetic aortic valve. There is aortic atherosclerosis. No. Bones appear osteoporotic. IMPRESSION: Slight scarring in the lower lung zones. No edema or consolidation. Stable cardiac prominence. Pacemaker leads attached to right atrium right ventricle. Aortic valve replacement present. There is aortic atherosclerosis. Aortic Atherosclerosis (ICD10-I70.0). Electronically Signed   By: Lowella Grip III M.D.   On: 11/07/2017 09:35   Dg Lumbar Spine Complete  Result Date: 11/07/2017 CLINICAL DATA:  Low back pain.  Worsening for 3 days. EXAM: LUMBAR SPINE - COMPLETE 4+ VIEW COMPARISON:  10/14/2017 FINDINGS: Again noted is some levoscoliosis in the lumbar spine. The curvature is similar to the comparison examination. Again noted is significant anterolisthesis at L5-S1 measuring roughly 1.8 cm and unchanged. This is compatible with grade 2 or grade 3  spondylolisthesis. Chronic disc space narrowing and endplate changes at V4-U9 and L2-L3. Mild retrolisthesis at L2-L3. The vertebral body heights are maintained. Limited evaluation for pars defects but suspect pars defects at L5 associated with the anterolisthesis. Large amount of stool in the abdomen. IMPRESSION: Stable appearance of the lumbar spine without acute abnormality. Scoliosis with multilevel degenerative disease and significant anterolisthesis at L5-S1. Large stool burden. Electronically Signed   By: Markus Daft M.D.   On: 11/07/2017 09:38   Ct Lumbar Spine Wo Contrast  Result Date: 11/07/2017 CLINICAL DATA:  Severe low back pain. Abnormal lumbar spine radiographs. EXAM: CT LUMBAR SPINE WITHOUT CONTRAST TECHNIQUE: Multidetector CT imaging of the lumbar spine was performed without intravenous contrast administration. Multiplanar CT image reconstructions were also generated. COMPARISON:  Lumbar spine radiographs 11/07/2017. CT of the abdomen and pelvis 03/19/2016. FINDINGS: Segmentation: 5 non rib-bearing lumbar type vertebral bodies are present. Alignment: Grade 2 anterolisthesis at L5-S1 measures 18 mm, associated with bilateral L5 pars defects. Slight retrolisthesis at L1-2 and L2-3 is stable. Levoconvex curvature of the lumbar spine is centered at L3. There is rightward curvature centered at L1. Vertebrae: Chronic sclerotic changes are again noted on the left at L1-2 and more diffusely at L2-3. Chronic sclerotic changes are present at L5-S1. Vertebral body heights are maintained. No acute fracture is present. Paraspinal and other soft tissues: Bilateral pleural thickening is present. The heart is enlarged. Pacing wires are in place. Visualized abdominal organs are within normal limits. Atherosclerotic calcifications are present in the aorta without aneurysm. No significant adenopathy is present. Disc levels: T12-L1: Negative. L1-2: Asymmetric endplate changes are present on the left. The central  canal is patent. Moderate bilateral foraminal stenosis is stable. L2-3: Slight retrolisthesis is present. Facet hypertrophy is asymmetric on the right. This results in moderate right and mild left subarticular narrowing. Moderate foraminal stenosis is present on the right, unchanged. Mild left foraminal narrowing is present. L3-4: A rightward disc protrusion is present. Mild facet hypertrophy is noted. This results in mild right subarticular narrowing. Mild foraminal narrowing bilaterally is stable. L4-5: Mild disc bulging and left greater than right facet hypertrophy are present. Central canal is patent. Mild foraminal narrowing is worse on the left. L5-S1: Grade 2 anterolisthesis is present. The central canal is patent. Severe left and moderate right foraminal stenosis is present. IMPRESSION: 1. Stable multilevel spondylosis of the lumbar spine. 2. Bilateral pars defects at L5 with grade 2 anterolisthesis measuring up to 18 mm. 3. Severe left and moderate right foraminal stenosis at L5-S1 is similar the prior study. 4. Mild foraminal narrowing at L4-5 is worse on the left. 5. Mild right subarticular narrowing and mild bilateral foraminal  stenosis at L3-4 is stable. 6. Moderate right and mild left subarticular and foraminal narrowing at L2-3 is similar the prior study. 7. Moderate bilateral foraminal stenosis at L1-2 is stable. Electronically Signed   By: San Morelle M.D.   On: 11/07/2017 18:25   US Renal  Result Date: 11/07/2017 CLINICAL DATA:  Urinary retention. EXAM: RENAL / URINARY TRACT ULTRASOUND COMPLETE COMPARISON:  None. FINDINGS: Right Kidney: Length: 9.1 cm. Echogenicity within normal limits. No mass or hydronephrosis visualized. Left Kidney: Length: 10.1 cm. Echogenicity within normal limits. No mass or hydronephrosis visualized. Bladder: Empty and not well visualized on this exam. IMPRESSION: Normal appearance of both kidneys.  No evidence of hydronephrosis. Empty urinary bladder.  Electronically Signed   By: Earle Gell M.D.   On: 11/07/2017 13:46        Scheduled Meds: . atorvastatin  20 mg Oral Daily  . budesonide  0.5 mg Nebulization Daily  . famotidine  20 mg Oral Daily  . feeding supplement (ENSURE ENLIVE)  237 mL Oral BID BM  . latanoprost  1 drop Both Eyes QHS  . levothyroxine  12.5 mcg Oral QAC breakfast  . lidocaine  1 patch Transdermal Q24H  . metoprolol succinate  25 mg Oral Daily  . mometasone-formoterol  2 puff Inhalation BID  . polyethylene glycol  17 g Oral BID  . predniSONE  40 mg Oral Q breakfast  . vitamin B-12  500 mcg Oral Daily   Continuous Infusions:   LOS: 0 days    Time spent: 35 minutes.     Elmarie Shiley, MD [facility] Pager [phone]  If 7PM-7AM, please contact night-coverage [email or web address] Password [email or web address] 11/09/2017, 8:14 AM

## 2017-11-09 NOTE — Progress Notes (Signed)
Ryan [name]   DOB:01-18-1930   HK#:[id number]    Assessment & Plan:  Acute thrombocytopenia His overall presentation is not compatible with ITP.  He is already on prednisone More than likely, it is related to some form of infection or sepsis, especially given positive blood culture Continue supportive care for now He does not need platelet transfusion unless platelet count is less than 10,000 or signs of bleeding.  Leukocytosis Positive blood culture I would defer management to primary service to start him on antibiotics The patient is noted to be allergic to Cipro Broad-spectrum antibiotics will likely cause thrombocytopenia but in the scope of things, he needs to be started on treatment The prednisone could likely mask fever  Severe back pain Cause is unknown I am not able to appreciate any abscess on his back although the imaging is suboptimal For now, I recommend pain control with pain medicine as needed  Discharge planning The patient is very ill He will likely be here for a few more days until his issues resolve  [name], MD 11/09/2017  7:29 AM   Subjective:  The patient had multiple bowel movements and CT scan yesterday.  His back pain is stable.  He has been noted to be afebrile. The patient denies any recent signs or symptoms of bleeding such as spontaneous epistaxis, hematuria or hematochezia.   Objective:  Vitals:   11/08/17 2027 11/08/17 2042  BP:  129/68  Pulse:  68  Resp:  17  Temp:  98 F (36.7 C)  SpO2: 96% (!) 85%     Intake/Output Summary (Last 24 hours) at 11/09/2017 0729 Last data filed at 11/09/2017 0200 Gross per 24 hour  Intake 180 ml  Output 275 ml  Net -95 ml    GENERAL:alert, no distress and comfortable SKIN: skin color, texture, turgor are normal, no rashes or significant lesions NEURO: alert & oriented x 3 with fluent speech   Labs:  Lab Results  Component Value Date   WBC 17.7 (H) 11/09/2017   HGB 12.6 (L) 11/09/2017    HCT 39.8 11/09/2017   MCV 82.2 11/09/2017   PLT 54 (L) 11/09/2017   NEUTROABS 9.9 (H) 11/08/2017    Lab Results  Component Value Date   NA 138 11/09/2017   K 4.4 11/09/2017   CL 103 11/09/2017   CO2 26 11/09/2017    Studies: I have personally reviewed imaging study Ct Abdomen Pelvis Wo Contrast  Result Date: 11/08/2017 CLINICAL DATA:  Abdominal distention.  Leukocytosis. EXAM: CT ABDOMEN AND PELVIS WITHOUT CONTRAST TECHNIQUE: Multidetector CT imaging of the abdomen and pelvis was performed following the standard protocol without IV contrast. COMPARISON:  CT lumbar spine 11/07/2017 FINDINGS: Lower chest: Right greater than left basilar airspace disease is seen present. Small right pleural effusion is present. The heart is enlarged. Pacing wires are present. Coronary artery calcifications are noted. Aortic valve replacement is noted. Hepatobiliary: No focal liver abnormality is seen. No gallstones, gallbladder wall thickening, or biliary dilatation. Pancreas: Unremarkable. No pancreatic ductal dilatation or surrounding inflammatory changes. Spleen: Normal in size without focal abnormality. Adrenals/Urinary Tract: Adrenal glands are normal bilaterally. There is some stranding about both kidneys. No focal mass lesion is present. There is no hydronephrosis. No stones are present. Ureters are within normal limits bilaterally. The urinary bladder is unremarkable. Stomach/Bowel: Stomach and duodenum are within normal limits. The small bowel is unremarkable. The terminal ileum is within normal limits. Moderate stool is present throughout the colon. There is no obstruction.  There is transition point along the sigmoid colon which may be due to adhesions. The distal sigmoid colon and rectum are decompressed. Vascular/Lymphatic: Atherosclerotic calcifications are present in the aorta and branch vessels without aneurysm. No significant adenopathy is present. Reproductive: Prostate is unremarkable. Other: No  significant effusions are present. No significant ventral hernia is present. Musculoskeletal: Degenerative changes are again noted in the lumbar spine. Bilateral L5 pars defects are present. There is no significant interval change. IMPRESSION: 1. Moderate stool throughout the colon. There is a relative transition point in the sigmoid without obstruction. This could be due to adhesions. 2. No other acute or focal lesion to explain abdominal distention or leukocytosis. 3. Right greater than left basilar airspace disease likely reflects atelectasis. Infection is not excluded on the right. 4. Aortic Atherosclerosis (ICD10-I70.0). No significant aneurysm is present. 5. Coronary artery disease. 6. TAVR 7. Mild inflammatory changes about the kidneys and in the mesentery likely reflects mild edema. Electronically Signed   By: San Morelle M.D.   On: 11/08/2017 15:10   Dg Chest 1 View  Result Date: 11/07/2017 CLINICAL DATA:  Respiratory distress EXAM: CHEST  1 VIEW COMPARISON:  Oct 23, 2017 FINDINGS: There is slight scarring in the lower lung zones. There is no edema or consolidation. Heart is mildly enlarged with pulmonary vascularity normal. Pacemaker leads attached right atrium and right ventricle. There is a prosthetic aortic valve. There is aortic atherosclerosis. No. Bones appear osteoporotic. IMPRESSION: Slight scarring in the lower lung zones. No edema or consolidation. Stable cardiac prominence. Pacemaker leads attached to right atrium right ventricle. Aortic valve replacement present. There is aortic atherosclerosis. Aortic Atherosclerosis (ICD10-I70.0). Electronically Signed   By: Lowella Grip III M.D.   On: 11/07/2017 09:35   Dg Lumbar Spine Complete  Result Date: 11/07/2017 CLINICAL DATA:  Low back pain.  Worsening for 3 days. EXAM: LUMBAR SPINE - COMPLETE 4+ VIEW COMPARISON:  10/14/2017 FINDINGS: Again noted is some levoscoliosis in the lumbar spine. The curvature is similar to the comparison  examination. Again noted is significant anterolisthesis at L5-S1 measuring roughly 1.8 cm and unchanged. This is compatible with grade 2 or grade 3 spondylolisthesis. Chronic disc space narrowing and endplate changes at M4-Q6 and L2-L3. Mild retrolisthesis at L2-L3. The vertebral body heights are maintained. Limited evaluation for pars defects but suspect pars defects at L5 associated with the anterolisthesis. Large amount of stool in the abdomen. IMPRESSION: Stable appearance of the lumbar spine without acute abnormality. Scoliosis with multilevel degenerative disease and significant anterolisthesis at L5-S1. Large stool burden. Electronically Signed   By: Markus Daft M.D.   On: 11/07/2017 09:38   Ct Lumbar Spine Wo Contrast  Result Date: 11/07/2017 CLINICAL DATA:  Severe low back pain. Abnormal lumbar spine radiographs. EXAM: CT LUMBAR SPINE WITHOUT CONTRAST TECHNIQUE: Multidetector CT imaging of the lumbar spine was performed without intravenous contrast administration. Multiplanar CT image reconstructions were also generated. COMPARISON:  Lumbar spine radiographs 11/07/2017. CT of the abdomen and pelvis 03/19/2016. FINDINGS: Segmentation: 5 non rib-bearing lumbar type vertebral bodies are present. Alignment: Grade 2 anterolisthesis at L5-S1 measures 18 mm, associated with bilateral L5 pars defects. Slight retrolisthesis at L1-2 and L2-3 is stable. Levoconvex curvature of the lumbar spine is centered at L3. There is rightward curvature centered at L1. Vertebrae: Chronic sclerotic changes are again noted on the left at L1-2 and more diffusely at L2-3. Chronic sclerotic changes are present at L5-S1. Vertebral body heights are maintained. No acute fracture is present. Paraspinal and  other soft tissues: Bilateral pleural thickening is present. The heart is enlarged. Pacing wires are in place. Visualized abdominal organs are within normal limits. Atherosclerotic calcifications are present in the aorta without  aneurysm. No significant adenopathy is present. Disc levels: T12-L1: Negative. L1-2: Asymmetric endplate changes are present on the left. The central canal is patent. Moderate bilateral foraminal stenosis is stable. L2-3: Slight retrolisthesis is present. Facet hypertrophy is asymmetric on the right. This results in moderate right and mild left subarticular narrowing. Moderate foraminal stenosis is present on the right, unchanged. Mild left foraminal narrowing is present. L3-4: A rightward disc protrusion is present. Mild facet hypertrophy is noted. This results in mild right subarticular narrowing. Mild foraminal narrowing bilaterally is stable. L4-5: Mild disc bulging and left greater than right facet hypertrophy are present. Central canal is patent. Mild foraminal narrowing is worse on the left. L5-S1: Grade 2 anterolisthesis is present. The central canal is patent. Severe left and moderate right foraminal stenosis is present. IMPRESSION: 1. Stable multilevel spondylosis of the lumbar spine. 2. Bilateral pars defects at L5 with grade 2 anterolisthesis measuring up to 18 mm. 3. Severe left and moderate right foraminal stenosis at L5-S1 is similar the prior study. 4. Mild foraminal narrowing at L4-5 is worse on the left. 5. Mild right subarticular narrowing and mild bilateral foraminal stenosis at L3-4 is stable. 6. Moderate right and mild left subarticular and foraminal narrowing at L2-3 is similar the prior study. 7. Moderate bilateral foraminal stenosis at L1-2 is stable. Electronically Signed   By: San Morelle M.D.   On: 11/07/2017 18:25   US Renal  Result Date: 11/07/2017 CLINICAL DATA:  Urinary retention. EXAM: RENAL / URINARY TRACT ULTRASOUND COMPLETE COMPARISON:  None. FINDINGS: Right Kidney: Length: 9.1 cm. Echogenicity within normal limits. No mass or hydronephrosis visualized. Left Kidney: Length: 10.1 cm. Echogenicity within normal limits. No mass or hydronephrosis visualized. Bladder: Empty  and not well visualized on this exam. IMPRESSION: Normal appearance of both kidneys.  No evidence of hydronephrosis. Empty urinary bladder. Electronically Signed   By: Earle Gell M.D.   On: 11/07/2017 13:46

## 2017-11-09 NOTE — Consult Note (Signed)
Consultation  Referring Provider:     Niel Hummer MD Primary Care Physician:  Dorothyann Peng, NP Primary Gastroenterologist:        NA Reason for Consultation:     Abnormal CT scan         HPI:   Ryan [name] is a 84 y.o. 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 Patient was admitted for severe back pain, GI service consulted for abnormal CT scan of the abdomen done during workup for this issue.  The patient has recently stopped amiodarone in recent weeks, due to persistent cough, thought to be potentially related, and placed on prednisone which appeared to help his cough. He was admitted over the weekend with acute onset severe lower mid back pain which was debilitating. Non contrast CT scan showed foraminal stenosis and degenerative changes of the lumbar spine, with Neurosurgery being consulted. During this time he developed thrombocytopenia to level of 50s, (previously in the 200s). Hematology was consulted, and CT scan abdomen obtained to rule out retroperitoneal bleeding. There was no evidence of RP bleed, but moderate stool burden noted in the colon with a suspected transition point in the sigmoid without obstruction (? Adhesive disease per radiology). No other findings noted to account for back pain. Mild inflammatory changes around the kidneys and mesentery thought to reflect edema. We were consulted regarding the colonic findings.   The patient had been placed on Miralax and dulcolax and had 2 large BMs in the past 24 hours. He states his back pain is slightly improved but continues to bother him significantly (rated 7-8/10). He does endorse some mild LLQ discomfort which he thinks may have been bothering him intermittently over the past 1-2 days, but nothing significant. He has some nausea and decreased appetite but is tolerating  PO, no vomiting. No blood in his stools. Only on clear liquid diet at this time,  He reports a history of a remote Meckel's diverticulum with surgery for this as a teenager. He had a colonoscopy several years ago, nothing significant noted at the time other than a reportedly tortous colon.   During his course he has been evaluated by Neurosurgery, who did not feel CT myelogram was indicated (patient cannot have MRI due to pacemaker). His WBC rose this AM and his blood cultures are now positive for Enterococcus. Started on IV Ampicillin. Infectious disease consulted.     Past Medical History:  Diagnosis Date  . Age-related macular degeneration   . Allergic rhinitis   . Amiodarone toxicity   . Aortic valve stenosis    a. s/p TAVR 06/2016.  . Arthritis    "hands, lower back" (04/30/2016)  . Asthma   . Cardiomyopathy ([place])    a. EF declined to 35-40% by echo 09/2017 (previously 50-55% after TAVR).  . Carotid artery obstruction    a. s/p bilat CEA.  . Chronic combined systolic and diastolic CHF (congestive heart failure) ([place])   . Chronic lower back pain   . Coronary arteriosclerosis in native artery    a. 2017 Cardiac catheterization demonstrated worsening CAD with 70% mid LCx, 80% OM1 and 80% mid RCA stenosis. b.  In 11/17, he underwent successful rotational atherectomy and DES to RCA.  . Diverticulitis of colon   . DJD (degenerative joint disease)   . Dyspnea    "constant but the degree changes"  . GERD (  gastroesophageal reflux disease)   . History of elevated PSA 05/2009  . History of hiatal hernia   . HLD (hyperlipidemia)   . Hypertension   . Nocturnal hypoxemia   . OSA (obstructive sleep apnea)    intolerant to CPAP  . PAF (paroxysmal atrial fibrillation) ([place])   . Paroxysmal supraventricular tachycardia ([place])   . Pneumonia    "when I was a kid"  . Presence of permanent cardiac pacemaker   . Primary malignant neoplasm of bladder ([place])   . Prostate cancer [facility])     Past  Surgical History:  Procedure Laterality Date  . APPENDECTOMY    . CARDIAC CATHETERIZATION N/A 01/17/2016   Procedure: Right/Left Heart Cath and Coronary Angiography;  Surgeon: Belva Crome, MD;  Location: [place] CV LAB;  Service: Cardiovascular;  Laterality: N/A;  . CARDIAC CATHETERIZATION N/A 04/30/2016   Procedure: Coronary/Graft Atherectomy;  Surgeon: Sherren Mocha, MD;  Location: [place] CV LAB;  Service: Cardiovascular;  Laterality: N/A;  . CAROTID ENDARTERECTOMY Bilateral   . CATARACT EXTRACTION W/ INTRAOCULAR LENS  IMPLANT, BILATERAL Bilateral   . COLON SURGERY  1981   Meckles Diverticulum with volvulus  . EP IMPLANTABLE DEVICE N/A 03/21/2016   Procedure: Pacemaker Implant;  Surgeon: Evans Lance, MD;  Location: [place] [facility];  Service: Cardiovascular;  Laterality: N/A;  . INGUINAL HERNIA REPAIR Right 2009  . INSERTION PROSTATE RADIATION SEED    . TEE WITHOUT CARDIOVERSION N/A 06/10/2016   Procedure: TRANSESOPHAGEAL ECHOCARDIOGRAM (TEE);  Surgeon: Sherren Mocha, MD;  Location: [place];  Service: Open Heart Surgery;  Laterality: N/A;  . TONSILLECTOMY  ~ 1947  . TRANSCATHETER AORTIC VALVE REPLACEMENT, TRANSFEMORAL N/A 06/10/2016   Procedure: TRANSCATHETER AORTIC VALVE REPLACEMENT, TRANSFEMORAL;  Surgeon: Sherren Mocha, MD;  Location: [place];  Service: Open Heart Surgery;  Laterality: N/A;    Family History  Problem Relation Age of Onset  . Heart disease Mother   . Heart disease Father      Social History   Tobacco Use  . Smoking status: Never Smoker  . Smokeless tobacco: Never Used  Substance Use Topics  . Alcohol use: No    Alcohol/week: 0.0 oz    Comment: 04/30/2016 "nothing in years"  . Drug use: No    Prior to Admission medications   Medication Sig Start Date End Date Taking? Authorizing Provider  acetaminophen (TYLENOL) 500 MG tablet Take 500 mg by mouth as needed for mild pain.    Yes [provider]  acetaminophen-codeine (TYLENOL #3) 300-30  MG tablet Take 1 tablet by mouth at bedtime.   Yes [provider]  albuterol (PROVENTIL) (2.5 MG/3ML) 0.083% nebulizer solution Take 3 mLs (2.5 mg total) by nebulization every 8 (eight) hours as needed for wheezing or shortness of breath. 10/13/17  Yes Nafziger, Tommi Rumps, NP  apixaban (ELIQUIS) 2.5 MG TABS tablet Take 1 tablet (2.5 mg total) by mouth 2 (two) times daily. 10/22/17  Yes Evans Lance, MD  atorvastatin (LIPITOR) 20 MG tablet Take 1 tablet (20 mg total) by mouth daily. 11/24/16  Yes Dorothy Spark, MD  budesonide-formoterol [facility]) 80-4.5 MCG/ACT inhaler Inhale 2 puffs into the lungs 2 (two) times daily. 10/20/17  Yes Lauraine Rinne, NP  cholecalciferol (VITAMIN D) 1000 units tablet Take 1,000 Units by mouth 2 (two) times a week.    Yes [provider]  Coenzyme Q10 (COQ-10 PO) Take 1 capsule by mouth 2 (two) times a week.    Yes [provider]  Cyanocobalamin (VITAMIN B 12 PO) Take 1 capsule by mouth daily.   Yes [provider]  furosemide (LASIX) 20 MG tablet Take 1 tablet (20 mg total) by mouth daily as needed for edema. 10/22/17 01/20/18 Yes Dorothy Spark, MD  latanoprost (XALATAN) 0.005 % ophthalmic solution Place 1 drop into both eyes at bedtime.   Yes [provider]  levothyroxine (SYNTHROID, LEVOTHROID) 25 MCG tablet Take 0.5 tablets (12.5 mcg total) by mouth daily before breakfast. 10/21/17  Yes Dorothy Spark, MD  menthol-cetylpyridinium (CEPACOL) 3 MG lozenge Take 1 lozenge (3 mg total) by mouth as needed for sore throat. 10/24/17  Yes Lavina Hamman, MD  metoprolol succinate (TOPROL XL) 25 MG 24 hr tablet Take 1 tablet (25 mg total) by mouth daily. 07/15/17 07/10/18 Yes Dorothy Spark, MD  Netarsudil Dimesylate (RHOPRESSA) 0.02 % SOLN Place 1 drop into both eyes at bedtime. Sample given by the physician   Yes [provider]  nitroGLYCERIN (NITROSTAT) 0.4 MG SL tablet Place 1 tablet (0.4 mg total) under the  tongue every 5 (five) minutes as needed. 05/01/16  Yes Cheryln Manly, NP  predniSONE (DELTASONE) 10 MG tablet Take 4 tablets (40 mg) daily for the next week, take 3 tablets (30 mg) daily for the following week, then take 2 tablets (20 mg) daily until follow-up appointment Patient taking differently: Take 40 mg by mouth daily with breakfast. Take 4 tablets (40 mg) daily for the next week, take 3 tablets (30 mg) daily for the following week, then take 2 tablets (20 mg) daily until follow-up appointment 11/03/17  Yes Lauraine Rinne, NP  spironolactone (ALDACTONE) 25 MG tablet Take 0.5 tablets (12.5 mg total) by mouth daily. 11/05/17  Yes Dunn, Nedra Hai, PA-C    Current Facility-Administered Medications  Medication Dose Route Frequency Provider Last Rate Last Dose  . acetaminophen (TYLENOL) tablet 500 mg  500 mg Oral PRN Radene Gunning, NP      . albuterol (PROVENTIL) (2.5 MG/3ML) 0.083% nebulizer solution 2.5 mg  2.5 mg Nebulization Q8H PRN Black, Lezlie Octave, NP      . ampicillin (OMNIPEN) 2 g in sodium chloride 0.9 % 100 mL IVPB  2 g Intravenous Q6H Rolla Flatten, [facility]      . atorvastatin (LIPITOR) tablet 20 mg  20 mg Oral Daily Radene Gunning, NP   20 mg at 11/09/17 7510  . budesonide (PULMICORT) nebulizer solution 0.5 mg  0.5 mg Nebulization Daily Black, Lezlie Octave, NP      . cyclobenzaprine (FLEXERIL) tablet 5 mg  5 mg Oral TID PRN Regalado, Belkys A, MD   5 mg at 11/08/17 2132  . famotidine (PEPCID) tablet 20 mg  20 mg Oral Daily Regalado, Belkys A, MD   20 mg at 11/09/17 0941  . feeding supplement (ENSURE ENLIVE) (ENSURE ENLIVE) liquid 237 mL  237 mL Oral BID BM Regalado, Belkys A, MD   237 mL at 11/08/17 1612  . HYDROmorphone (DILAUDID) injection 0.5 mg  0.5 mg Intravenous Q3H PRN Regalado, Belkys A, MD   0.5 mg at 11/08/17 2132  . latanoprost (XALATAN) 0.005 % ophthalmic solution 1 drop  1 drop Both Eyes QHS Radene Gunning, NP   1 drop at 11/08/17 2132  . levothyroxine (SYNTHROID, LEVOTHROID)  tablet 12.5 mcg  12.5 mcg Oral QAC breakfast Radene Gunning, NP   12.5 mcg at 11/09/17 0938  . lidocaine (LIDODERM) 5 % 1 patch  1 patch  Transdermal Q24H Regalado, Belkys A, MD   1 patch at 11/09/17 0934  . lip balm (CARMEX) ointment   Topical PRN Regalado, Belkys A, MD      . menthol-cetylpyridinium (CEPACOL) lozenge 3 mg  1 lozenge Oral PRN Radene Gunning, NP      . metoprolol succinate (TOPROL-XL) 24 hr tablet 25 mg  25 mg Oral Daily Radene Gunning, NP   25 mg at 11/09/17 0941  . mometasone-formoterol (DULERA) 100-5 MCG/ACT inhaler 2 puff  2 puff Inhalation BID Radene Gunning, NP   2 puff at 11/09/17 0730  . nitroGLYCERIN (NITROSTAT) SL tablet 0.4 mg  0.4 mg Sublingual Q5 min PRN Black, Lezlie Octave, NP      . ondansetron [facility]) tablet 4 mg  4 mg Oral Q6H PRN Radene Gunning, NP       Or  . ondansetron [facility]) injection 4 mg  4 mg Intravenous Q6H PRN Black, Lezlie Octave, NP      . oxyCODONE-acetaminophen (PERCOCET/ROXICET) 5-325 MG per tablet 1 tablet  1 tablet Oral Q6H PRN Regalado, Belkys A, MD   1 tablet at 11/08/17 2329  . polyethylene glycol (MIRALAX / GLYCOLAX) packet 17 g  17 g Oral BID Regalado, Belkys A, MD   17 g at 11/09/17 0941  . predniSONE (DELTASONE) tablet 40 mg  40 mg Oral Q breakfast Radene Gunning, NP   40 mg at 11/09/17 0940  . traZODone (DESYREL) tablet 25 mg  25 mg Oral QHS PRN Radene Gunning, NP      . vitamin B-12 (CYANOCOBALAMIN) tablet 500 mcg  500 mcg Oral Daily Florencia Reasons, MD   500 mcg at 11/09/17 0175    Allergies as of 11/07/2017 - Review Complete 11/07/2017  Allergen Reaction Noted  . Ciprofloxacin Anaphylaxis 04/01/2015  . Hydrochlorothiazide Anaphylaxis   . Sulfa antibiotics Anaphylaxis   . Ace inhibitors Cough   . Losartan Cough   . Carvedilol Cough 08/25/2016  . Lisinopril Cough 08/05/2016     Review of Systems:    As per HPI, otherwise negative    Physical Exam:  Vital signs in last 24 hours: Temp:  [97.7 F (36.5 C)-98.1 F (36.7 C)] 98.1 F (36.7  C) (06/10 0857) Pulse Rate:  [60-73] 64 (06/10 0857) Resp:  [16-17] 16 (06/10 0857) BP: (129-144)/(56-95) 134/56 (06/10 0857) SpO2:  [85 %-99 %] 96 % (06/10 0857) Last BM Date: 11/08/17(large) General:   Pleasant male sitting in chair,  Head:  Normocephalic and atraumatic. Eyes:   No icterus.   Conjunctiva pink. Ears:  Normal auditory acuity. Neck:  Supple Lungs:  Respirations even and unlabored. Lungs clear to auscultation bilaterally.   Heart:  Regular rate and rhythm;  Abdomen:  Soft, , no significant tenderness appreciated. . No appreciable masses  Rectal:  Not performed.  Msk:  Symmetrical without gross deformities. Lower back focal tenderness just left of midline lumbar spine, no erythema or obvious abscess Extremities:  Trace edema LE. Neurologic:  Alert and  oriented x4;  Skin:  Intact without significant lesions or rashes. Psych:  Alert and cooperative. Normal affect.  LAB RESULTS: Recent Labs    11/08/17 0231 11/08/17 1310 11/09/17 0613  WBC 11.1* 11.5* 17.7*  HGB 11.7* 12.1* 12.6*  HCT 37.0* 38.4* 39.8  PLT 50* 51* 54*   BMET Recent Labs    11/07/17 0849 11/08/17 0231 11/09/17 0613  NA 141 139 138  K 4.0 4.1 4.4  CL 107 103 103  CO2  25 27 26   GLUCOSE 97 98 149*  BUN 20 17 24*  CREATININE 1.18 1.03 1.13  CALCIUM 8.7* 8.7* 8.9   LFT Recent Labs    11/09/17 0613  PROT 5.9*  ALBUMIN 2.8*  AST 41  ALT 41  ALKPHOS 87  BILITOT 0.9   PT/INR Recent Labs    11/07/17 0849 11/08/17 0231  LABPROT 16.3* 16.1*  INR 1.33 1.30    STUDIES: Ct Abdomen Pelvis Wo Contrast  Result Date: 11/08/2017 CLINICAL DATA:  Abdominal distention.  Leukocytosis. EXAM: CT ABDOMEN AND PELVIS WITHOUT CONTRAST TECHNIQUE: Multidetector CT imaging of the abdomen and pelvis was performed following the standard protocol without IV contrast. COMPARISON:  CT lumbar spine 11/07/2017 FINDINGS: Lower chest: Right greater than left basilar airspace disease is seen present. Small right  pleural effusion is present. The heart is enlarged. Pacing wires are present. Coronary artery calcifications are noted. Aortic valve replacement is noted. Hepatobiliary: No focal liver abnormality is seen. No gallstones, gallbladder wall thickening, or biliary dilatation. Pancreas: Unremarkable. No pancreatic ductal dilatation or surrounding inflammatory changes. Spleen: Normal in size without focal abnormality. Adrenals/Urinary Tract: Adrenal glands are normal bilaterally. There is some stranding about both kidneys. No focal mass lesion is present. There is no hydronephrosis. No stones are present. Ureters are within normal limits bilaterally. The urinary bladder is unremarkable. Stomach/Bowel: Stomach and duodenum are within normal limits. The small bowel is unremarkable. The terminal ileum is within normal limits. Moderate stool is present throughout the colon. There is no obstruction. There is transition point along the sigmoid colon which may be due to adhesions. The distal sigmoid colon and rectum are decompressed. Vascular/Lymphatic: Atherosclerotic calcifications are present in the aorta and branch vessels without aneurysm. No significant adenopathy is present. Reproductive: Prostate is unremarkable. Other: No significant effusions are present. No significant ventral hernia is present. Musculoskeletal: Degenerative changes are again noted in the lumbar spine. Bilateral L5 pars defects are present. There is no significant interval change. IMPRESSION: 1. Moderate stool throughout the colon. There is a relative transition point in the sigmoid without obstruction. This could be due to adhesions. 2. No other acute or focal lesion to explain abdominal distention or leukocytosis. 3. Right greater than left basilar airspace disease likely reflects atelectasis. Infection is not excluded on the right. 4. Aortic Atherosclerosis (ICD10-I70.0). No significant aneurysm is present. 5. Coronary artery disease. 6. TAVR 7.  Mild inflammatory changes about the kidneys and in the mesentery likely reflects mild edema. Electronically Signed   By: San Morelle M.D.   On: 11/08/2017 15:10   Ct Lumbar Spine Wo Contrast  Result Date: 11/07/2017 CLINICAL DATA:  Severe low back pain. Abnormal lumbar spine radiographs. EXAM: CT LUMBAR SPINE WITHOUT CONTRAST TECHNIQUE: Multidetector CT imaging of the lumbar spine was performed without intravenous contrast administration. Multiplanar CT image reconstructions were also generated. COMPARISON:  Lumbar spine radiographs 11/07/2017. CT of the abdomen and pelvis 03/19/2016. FINDINGS: Segmentation: 5 non rib-bearing lumbar type vertebral bodies are present. Alignment: Grade 2 anterolisthesis at L5-S1 measures 18 mm, associated with bilateral L5 pars defects. Slight retrolisthesis at L1-2 and L2-3 is stable. Levoconvex curvature of the lumbar spine is centered at L3. There is rightward curvature centered at L1. Vertebrae: Chronic sclerotic changes are again noted on the left at L1-2 and more diffusely at L2-3. Chronic sclerotic changes are present at L5-S1. Vertebral body heights are maintained. No acute fracture is present. Paraspinal and other soft tissues: Bilateral pleural thickening is present. The heart is  enlarged. Pacing wires are in place. Visualized abdominal organs are within normal limits. Atherosclerotic calcifications are present in the aorta without aneurysm. No significant adenopathy is present. Disc levels: T12-L1: Negative. L1-2: Asymmetric endplate changes are present on the left. The central canal is patent. Moderate bilateral foraminal stenosis is stable. L2-3: Slight retrolisthesis is present. Facet hypertrophy is asymmetric on the right. This results in moderate right and mild left subarticular narrowing. Moderate foraminal stenosis is present on the right, unchanged. Mild left foraminal narrowing is present. L3-4: A rightward disc protrusion is present. Mild facet  hypertrophy is noted. This results in mild right subarticular narrowing. Mild foraminal narrowing bilaterally is stable. L4-5: Mild disc bulging and left greater than right facet hypertrophy are present. Central canal is patent. Mild foraminal narrowing is worse on the left. L5-S1: Grade 2 anterolisthesis is present. The central canal is patent. Severe left and moderate right foraminal stenosis is present. IMPRESSION: 1. Stable multilevel spondylosis of the lumbar spine. 2. Bilateral pars defects at L5 with grade 2 anterolisthesis measuring up to 18 mm. 3. Severe left and moderate right foraminal stenosis at L5-S1 is similar the prior study. 4. Mild foraminal narrowing at L4-5 is worse on the left. 5. Mild right subarticular narrowing and mild bilateral foraminal stenosis at L3-4 is stable. 6. Moderate right and mild left subarticular and foraminal narrowing at L2-3 is similar the prior study. 7. Moderate bilateral foraminal stenosis at L1-2 is stable. Electronically Signed   By: San Morelle M.D.   On: 11/07/2017 18:25   US Renal  Result Date: 11/07/2017 CLINICAL DATA:  Urinary retention. EXAM: RENAL / URINARY TRACT ULTRASOUND COMPLETE COMPARISON:  None. FINDINGS: Right Kidney: Length: 9.1 cm. Echogenicity within normal limits. No mass or hydronephrosis visualized. Left Kidney: Length: 10.1 cm. Echogenicity within normal limits. No mass or hydronephrosis visualized. Bladder: Empty and not well visualized on this exam. IMPRESSION: Normal appearance of both kidneys.  No evidence of hydronephrosis. Empty urinary bladder. Electronically Signed   By: Earle Gell M.D.   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 GI service consulted for abnormal CT scan of the  abdomen done during workup for this issue. Now noted to have enterococcus bacteremia and rising WBC.   CT scan reviewed. There is some stool in the colon and a relative possible transition point suspected in the sigmoid colon, but he's certainly not obstructed. This may just be due to adhesive disease or normal variant, while malignancy seems less likely. I don't think this issue is causing his focal back pain, that would be unusual.   Given his enterococcus bacteremia, rising WBC, concern for discitis / endocarditis. Agree with infectious disease consult and TEE. Thrombocytopenia likely related to infection / bacteremia.   I don't think colonoscopy would be of high yield at this time with other etiologies being more likely, and would evaluate those issues first. I think it is okay for him to eat as he's not obstructed, I advanced his diet. Will continue a bowel regimen of Miralax twice daily for now, he should be on Miralax while taking narcotics to prevent impaction.   I have spoken about this case with Wilfrid Lund, my partner at [facility] who is this patient's son-in-law, and the patient's wife. They agreed with plan  for now. Will follow along, please call with questions.  [place] Cellar, MD [facility]

## 2017-11-09 NOTE — Consult Note (Addendum)
Cardiology Consultation:   Patient ID: Ryan [name]; [id number]; 1930/05/02   Admit date: 11/07/2017 Date of Consult: 11/09/2017  Primary Care Provider: Dorothyann Peng, NP Primary Cardiologist: Dr. Ena Dawley  Patient Profile:   Ryan [name] is a 84 y.o. 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 Tyrell Antonio.  History of Present Illness:   Ryan [name] is an 84 year old male with a history stated above who presented to [facility] on 11/07/2017 with acute on chronic intractable low back pain. Per review, it appears that his back pain began approximately 3 days prior to admission in which he took several doses of pain medication without resolution. He had no complaints of numbness or tingling in extremities or loss of bowel function however his pain was exacerbated by movement and straightening of his legs. He was found to have leukocytosis on admission in which blood cultures were subsequently positive for enterococcus. Initial concern was for acute urinary retention and bladder etiology. Given presenting symptoms, neurology was consulted. Initial xray of spine revealed stable appearance of lumbar spine with out acute abnormality with scoliosis with multilevel degenerative disease and significant anterolisthesis at L5-S1. He was unable to undergo MRI or CT myelogram secondary to pacemaker implantation and hx of Eliquis therapy with thrombocytopenia (platelets 60>50>51>54). Therefore, recommendations were made for medical management and PT/OT with close follow up in OP clinic if needed. Given his low platelets on admission, oncology was consulted. With concern for ITP.  A noncontrast CT scan was obtained to  rule out retroperitoneal bleed (as thought because of acute thrombocytopenia) which was negative for bleed.  Oncology feels acute thrombocytopenia is not compatible with ITP and likely related to infection/sepsis given positive blood culture. Given the above information from positive blood cultures for enterococcus, infectious disease was then consulted on 11/09/2017 who feels this is cardiac, urinary (UA negative with pending Cx) or GI related. CT with no evidence of discitis however in the setting of bacteremia, this is possible as well.  Recommend repeating CT approximately 2 weeks to evaluate for evolution or resolution with acute  recommendations for TEE during this hospital admission.  Of note, the patient was last seen in our office on 11/05/17 for follow up. At that time, he had c/o ongoing fatigue (chronic complaint) which has never improved with any cardiac interventions. He was recently admitted to the hospital Discussion was made between provider and Dr. Meda Coffee (primary cardiologist) about suspected depression in the setting of patients continual health decline despite multiple interventions. During his appointment he had no complaints of chest or back pain or SOB. He reported LE edema the week prior however there was no edema present during his visit. He reported intermittent right eye blurriness but no visual field loss. He is on supplemental home O2 at 3L. He has had a continual decline in LV function over the years, however invasive evaluation is not warranted given that it would not currently change management strategies at this point. His Amiodarone was stopped and Spironolactone 12.5mg  was added at this appointment.  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  Cardiology was consulted secondary to extensive cardiac history and the need for TEE evaluation in the setting of  acute bacteremia with concern for endocarditis.   Past Medical History:  Diagnosis  Date  . Age-related macular degeneration   . Allergic rhinitis   . Amiodarone toxicity   . Aortic valve stenosis    a. s/p TAVR 06/2016.  . Arthritis    "hands, lower back" (04/30/2016)  . Asthma   . Cardiomyopathy ([place])    a. EF declined to 35-40% by echo 09/2017 (previously 50-55% after TAVR).  . Carotid artery obstruction    a. s/p bilat CEA.  . Chronic combined systolic and diastolic CHF (congestive heart failure) ([place])   . Chronic lower back pain   . Coronary arteriosclerosis in native artery    a. 2017 Cardiac catheterization demonstrated worsening CAD with 70% mid LCx, 80% OM1 and 80% mid RCA stenosis. b.  In 11/17, he underwent successful rotational atherectomy and DES to RCA.  . Diverticulitis of colon   . DJD (degenerative joint disease)   . Dyspnea    "constant but the degree changes"  . GERD (gastroesophageal reflux disease)   . History of elevated PSA 05/2009  . History of hiatal hernia   . HLD (hyperlipidemia)   . Hypertension   . Leukocytosis 10/2017  . Nocturnal hypoxemia   . OSA (obstructive sleep apnea)    intolerant to CPAP  . PAF (paroxysmal atrial fibrillation) ([place])   . Paroxysmal supraventricular tachycardia ([place])   . Pneumonia    "when I was a kid"  . Presence of permanent cardiac pacemaker   . Primary malignant neoplasm of bladder ([place])   . Prostate cancer [facility])     Past Surgical History:  Procedure Laterality Date  . APPENDECTOMY    . CARDIAC CATHETERIZATION N/A 01/17/2016   Procedure: Right/Left Heart Cath and Coronary Angiography;  Surgeon: Belva Crome, MD;  Location: [place] [facility];  Service: Cardiovascular;  Laterality: N/A;  . CARDIAC CATHETERIZATION N/A 04/30/2016   Procedure: Coronary/Graft Atherectomy;  Surgeon: Sherren Mocha, MD;  Location: [place] [facility];  Service: Cardiovascular;  Laterality: N/A;  . CAROTID ENDARTERECTOMY Bilateral   . CATARACT EXTRACTION W/ INTRAOCULAR LENS  IMPLANT, BILATERAL Bilateral   . COLON SURGERY   1981   Meckles Diverticulum with volvulus  . EP IMPLANTABLE DEVICE N/A 03/21/2016   Procedure: Pacemaker Implant;  Surgeon: Evans Lance, MD;  Location: [place] [facility];  Service: Cardiovascular;  Laterality: N/A;  . EYE SURGERY    . INGUINAL HERNIA REPAIR Right 2009  . INSERT / REPLACE / REMOVE PACEMAKER    . INSERTION PROSTATE RADIATION SEED    . TEE WITHOUT CARDIOVERSION N/A 06/10/2016   Procedure: TRANSESOPHAGEAL ECHOCARDIOGRAM (TEE);  Surgeon: Sherren Mocha, MD;  Location: [place];  Service: Open Heart Surgery;  Laterality: N/A;  . TONSILLECTOMY  ~ 1947  . TRANSCATHETER AORTIC VALVE REPLACEMENT, TRANSFEMORAL N/A 06/10/2016   Procedure: TRANSCATHETER AORTIC VALVE REPLACEMENT, TRANSFEMORAL;  Surgeon: Sherren Mocha, MD;  Location: [place];  Service: Open Heart Surgery;  Laterality: N/A;     Prior to Admission medications   Medication Sig Start Date End Date Taking? Authorizing Provider  acetaminophen (TYLENOL) 500 MG tablet Take 500 mg by mouth as needed for mild pain.    Yes [provider]  acetaminophen-codeine (TYLENOL #3) 300-30 MG tablet Take 1 tablet by mouth at bedtime.   Yes [provider]  albuterol (PROVENTIL) (2.5 MG/3ML) 0.083% nebulizer solution Take 3 mLs (2.5 mg total) by nebulization every 8 (eight) hours as needed for wheezing or shortness of  breath. 10/13/17  Yes Nafziger, Tommi Rumps, NP  apixaban (ELIQUIS) 2.5 MG TABS tablet Take 1 tablet (2.5 mg total) by mouth 2 (two) times daily. 10/22/17  Yes Evans Lance, MD  atorvastatin (LIPITOR) 20 MG tablet Take 1 tablet (20 mg total) by mouth daily. 11/24/16  Yes Dorothy Spark, MD  budesonide-formoterol [facility]) 80-4.5 MCG/ACT inhaler Inhale 2 puffs into the lungs 2 (two) times daily. 10/20/17  Yes Lauraine Rinne, NP  cholecalciferol (VITAMIN D) 1000 units tablet Take 1,000 Units by mouth 2 (two) times a week.    Yes [provider]  Coenzyme Q10 (COQ-10 PO) Take 1 capsule by mouth 2 (two) times a  week.    Yes [provider]  Cyanocobalamin (VITAMIN B 12 PO) Take 1 capsule by mouth daily.   Yes [provider]  furosemide (LASIX) 20 MG tablet Take 1 tablet (20 mg total) by mouth daily as needed for edema. 10/22/17 01/20/18 Yes Dorothy Spark, MD  latanoprost (XALATAN) 0.005 % ophthalmic solution Place 1 drop into both eyes at bedtime.   Yes [provider]  levothyroxine (SYNTHROID, LEVOTHROID) 25 MCG tablet Take 0.5 tablets (12.5 mcg total) by mouth daily before breakfast. 10/21/17  Yes Dorothy Spark, MD  menthol-cetylpyridinium (CEPACOL) 3 MG lozenge Take 1 lozenge (3 mg total) by mouth as needed for sore throat. 10/24/17  Yes Lavina Hamman, MD  metoprolol succinate (TOPROL XL) 25 MG 24 hr tablet Take 1 tablet (25 mg total) by mouth daily. 07/15/17 07/10/18 Yes Dorothy Spark, MD  Netarsudil Dimesylate (RHOPRESSA) 0.02 % SOLN Place 1 drop into both eyes at bedtime. Sample given by the physician   Yes [provider]  nitroGLYCERIN (NITROSTAT) 0.4 MG SL tablet Place 1 tablet (0.4 mg total) under the tongue every 5 (five) minutes as needed. 05/01/16  Yes Cheryln Manly, NP  predniSONE (DELTASONE) 10 MG tablet Take 4 tablets (40 mg) daily for the next week, take 3 tablets (30 mg) daily for the following week, then take 2 tablets (20 mg) daily until follow-up appointment Patient taking differently: Take 40 mg by mouth daily with breakfast. Take 4 tablets (40 mg) daily for the next week, take 3 tablets (30 mg) daily for the following week, then take 2 tablets (20 mg) daily until follow-up appointment 11/03/17  Yes Lauraine Rinne, NP  spironolactone (ALDACTONE) 25 MG tablet Take 0.5 tablets (12.5 mg total) by mouth daily. 11/05/17  Yes Dunn, Nedra Hai, PA-C    Inpatient Medications: Scheduled Meds: . atorvastatin  20 mg Oral Daily  . budesonide  0.5 mg Nebulization Daily  . famotidine  20 mg Oral Daily  . feeding supplement (ENSURE ENLIVE)  237 mL Oral  BID BM  . latanoprost  1 drop Both Eyes QHS  . levothyroxine  12.5 mcg Oral QAC breakfast  . lidocaine  1 patch Transdermal Q24H  . metoprolol succinate  25 mg Oral Daily  . mometasone-formoterol  2 puff Inhalation BID  . polyethylene glycol  17 g Oral BID  . predniSONE  40 mg Oral Q breakfast  . vitamin B-12  500 mcg Oral Daily   Continuous Infusions: . ampicillin (OMNIPEN) IV     PRN Meds: acetaminophen, albuterol, cyclobenzaprine, HYDROmorphone (DILAUDID) injection, lip balm, menthol-cetylpyridinium, nitroGLYCERIN, ondansetron **OR** ondansetron (ZOFRAN) IV, oxyCODONE-acetaminophen, traZODone  Allergies:    Allergies  Allergen Reactions  . Ciprofloxacin Anaphylaxis  . Hydrochlorothiazide Anaphylaxis  . Sulfa Antibiotics Anaphylaxis  . Ace Inhibitors Cough  .  Losartan Cough  . Carvedilol Cough    Pt states it "causes him too cough."  . Lisinopril Cough    Pt reports worsening cough and "feeling wheezy."    Social History:   Social History   Socioeconomic History  . Marital status: Married    Spouse name: Not on file  . Number of children: Not on file  . Years of education: Not on file  . Highest education level: Not on file  Occupational History  . Occupation: retired  Scientific laboratory technician  . Financial resource strain: Not on file  . Food insecurity:    Worry: Not on file    Inability: Not on file  . Transportation needs:    Medical: Not on file    Non-medical: Not on file  Tobacco Use  . Smoking status: Never Smoker  . Smokeless tobacco: Never Used  Substance and Sexual Activity  . Alcohol use: No    Alcohol/week: 0.0 oz    Comment: 04/30/2016 "nothing in years"  . Drug use: No  . Sexual activity: Never  Lifestyle  . Physical activity:    Days per week: Not on file    Minutes per session: Not on file  . Stress: Not on file  Relationships  . Social connections:    Talks on phone: Not on file    Gets together: Not on file    Attends religious service: Not  on file    Active member of club or organization: Not on file    Attends meetings of clubs or organizations: Not on file    Relationship status: Not on file  . Intimate partner violence:    Fear of current or ex partner: Not on file    Emotionally abused: Not on file    Physically abused: Not on file    Forced sexual activity: Not on file  Other Topics Concern  . Not on file  Social History Narrative   Retired    Three children - One in [place], One in [place], and one in [place].        Family History:   Family History  Problem Relation Age of Onset  . Heart disease Mother   . Heart disease Father    Family Status:  Family Status  Relation Name Status  . Mother  Deceased  . Father  Deceased  . MGM  Deceased  . MGF  Deceased  . PGM  Deceased  . PGF  Deceased    ROS:  Please see the history of present illness.  All other ROS reviewed and negative.     Physical Exam/Data:   Vitals:   11/08/17 2027 11/08/17 2042 11/09/17 0733 11/09/17 0857  BP:  129/68  (!) 134/56  Pulse:  68  64  Resp:  17  16  Temp:  98 F (36.7 C)  98.1 F (36.7 C)  TempSrc:  Oral  Oral  SpO2: 96% (!) 85% 99% 96%  Weight:        Intake/Output Summary (Last 24 hours) at 11/09/2017 1118 Last data filed at 11/09/2017 0602 Gross per 24 hour  Intake 60 ml  Output 0 ml  Net 60 ml   Filed Weights   11/07/17 0757  Weight: 140 lb (63.5 kg)   Body mass index is 23.3 kg/m.   General: Frail, elderly, NAD Skin: Warm, dry, intact  Head: Normocephalic, atraumatic, clear, moist mucus membranes. Neck: Negative for carotid bruits. No JVD Lungs:Clear to ausculation bilaterally. No wheezes, rales, or  rhonchi. Breathing is unlabored. Cardiovascular: RRR with S1 S2. + murmur Abdomen: Soft, non-tender, non-distended with normoactive bowel sounds.  No obvious abdominal masses. MSK: Strength and tone appear decreased for age. 5/5 in all extremities Extremities: No edema. No clubbing or cyanosis. DP/PT  pulses 2+ bilaterally Neuro: Alert and oriented. No focal deficits. No facial asymmetry. MAE spontaneously. Psych: Responds to questions appropriately with normal affect.    EKG:  The EKG was personally reviewed and demonstrates:  11/08/17 NSR with LBBB  Telemetry:  Telemetry was personally reviewed and demonstrates: 11/09/17 NSR with atrial pacing   Relevant CV Studies:  ECHO: Echocardiogram 10/24/2017: Study Conclusions  - Left ventricle: The cavity size was normal. Wall thickness was   increased in a pattern of moderate LVH. Systolic function was   moderately reduced. The estimated ejection fraction was in the   range of 35% to 40%. Wall motion was normal; there were no   regional wall motion abnormalities. Doppler parameters are   consistent with abnormal left ventricular relaxation (grade 1   diastolic dysfunction). - Aortic valve: A bioprosthesis was present. The prosthesis had a   normal range of motion. There was mild to moderate stenosis.   Valve area (VTI): 1.15 cm^2. Valve area (Vmax): 1.17 cm^2. Valve   area (Vmean): 1.13 cm^2. - Mitral valve: There was mild regurgitation. - Pulmonary arteries: Systolic pressure was moderately increased.   PA peak pressure: 50 mm Hg (S).  Echocardiogram 05/13/2017: Study Conclusions  - Left ventricle: The cavity size was normal. Wall thickness was   increased in a pattern of mild LVH. Systolic function was mildly   to moderately reduced. The estimated ejection fraction was in the   range of 40% to 45%. Diffuse hypokinesis. Doppler parameters are   consistent with abnormal left ventricular relaxation (grade 1   diastolic dysfunction). - Ventricular septum: Septal motion showed abnormal function and   dyssynergy. - Aortic valve: A bioprosthesis was present. There was mild   regurgitation. Valve area (VTI): 1.12 cm^2. Valve area (Vmax):   1.1 cm^2. Valve area (Vmean): 1.13 cm^2. - Mitral valve: There was mild regurgitation. - Left  atrium: The atrium was mildly dilated. - Pulmonary arteries: Systolic pressure was mildly to moderately   increased. PA peak pressure: 44 mm Hg (S).  Impressions:  - Mild to moderate LV systolic dysfunction; mild diastolic   dysfunction; mild LVH; s/p TAVR with mean gradient 12 mmHg and   mild AI; mild MR; mild TR with mild to moderate pulmonary   hypertension.   CATH: Cardiac cath 01/17/2016:   Prox RCA to Mid RCA lesion, 20 %stenosed.  Mid RCA lesion, 80 %stenosed.  Ost 1st Mrg to 1st Mrg lesion, 80 %stenosed.  Mid Cx to Dist Cx lesion, 70 %stenosed.  Prox LAD to Dist LAD lesion, 40 %stenosed.  The left ventricular systolic function is normal.  LV end diastolic pressure is normal.  The left ventricular ejection fraction is 55-65% by visual estimate.  LV end diastolic pressure is normal.    There is three-vessel coronary calcification.  80% eccentric mid RCA stenosis.  80% ostial obtuse marginal #1 stenosis.  Moderate diffuse calcified LAD disease less than 50%.  Normal left ventricular systolic function and hemodynamics  Normal pulmonary pressures and pulmonary capillary wedge.  Moderate to severe aortic stenosis with aortic valve area 1.04.   RECOMMENDATIONS:   Anti-ischemic therapy to potentially include beta blocker therapy, antiplatelet therapy, and statin therapy.  Intervention on both the right coronary and  obtuse marginal stenosis would be higher than usual risk due to heavy calcification and vessel tortuosity. Initial inclination is medical therapy in the absence of clear anginal symptoms.  Fatigue even at rest is of uncertain etiology. Exclude other possibilities including depression, although on one systemic illnesses.  Laboratory Data:  Chemistry Recent Labs  Lab 11/07/17 0849 11/08/17 0231 11/09/17 0613  NA 141 139 138  K 4.0 4.1 4.4  CL 107 103 103  CO2 25 27 26   GLUCOSE 97 98 149*  BUN 20 17 24*  CREATININE 1.18 1.03 1.13    CALCIUM 8.7* 8.7* 8.9  GFRNONAA 54* >60 57*  GFRAA >60 >60 >60  ANIONGAP 9 9 9     Total Protein  Date Value Ref Range Status  11/09/2017 5.9 (L) 6.5 - 8.1 g/dL Final   Albumin  Date Value Ref Range Status  11/09/2017 2.8 (L) 3.5 - 5.0 g/dL Final   AST  Date Value Ref Range Status  11/09/2017 41 15 - 41 U/L Final   ALT  Date Value Ref Range Status  11/09/2017 41 17 - 63 U/L Final   Alkaline Phosphatase  Date Value Ref Range Status  11/09/2017 87 38 - 126 U/L Final   Total Bilirubin  Date Value Ref Range Status  11/09/2017 0.9 0.3 - 1.2 mg/dL Final   Hematology Recent Labs  Lab 11/08/17 0231 11/08/17 1310 11/09/17 0613  WBC 11.1* 11.5* 17.7*  RBC 4.53 4.73 4.84  HGB 11.7* 12.1* 12.6*  HCT 37.0* 38.4* 39.8  MCV 81.7 81.2 82.2  MCH 25.8* 25.6* 26.0  MCHC 31.6 31.5 31.7  RDW 19.5* 19.3* 19.2*  PLT 50* 51* 54*   Cardiac Enzymes Recent Labs  Lab 11/07/17 0849 11/07/17 1227 11/07/17 1834 11/08/17 0231  TROPONINI 0.20* 0.09* 0.08* 0.08*   No results for input(s): TROPIPOC in the last 168 hours.  BNP Recent Labs  Lab 11/07/17 0849  BNP 219.3*    DDimer  Recent Labs  Lab 11/08/17 1310  DDIMER 2.65*   TSH:  Lab Results  Component Value Date   TSH 2.060 11/07/2017   Lipids: Lab Results  Component Value Date   CHOL 130 05/01/2017   HDL 56.00 05/01/2017   LDLCALC 60 05/01/2017   TRIG 70.0 05/01/2017   CHOLHDL 2 05/01/2017   HgbA1c: Lab Results  Component Value Date   HGBA1C 5.5 06/06/2016    Radiology/Studies:  Ct Abdomen Pelvis Wo Contrast  Result Date: 11/08/2017 CLINICAL DATA:  Abdominal distention.  Leukocytosis. EXAM: CT ABDOMEN AND PELVIS WITHOUT CONTRAST TECHNIQUE: Multidetector CT imaging of the abdomen and pelvis was performed following the standard protocol without IV contrast. COMPARISON:  CT lumbar spine 11/07/2017 FINDINGS: Lower chest: Right greater than left basilar airspace disease is seen present. Small right pleural effusion  is present. The heart is enlarged. Pacing wires are present. Coronary artery calcifications are noted. Aortic valve replacement is noted. Hepatobiliary: No focal liver abnormality is seen. No gallstones, gallbladder wall thickening, or biliary dilatation. Pancreas: Unremarkable. No pancreatic ductal dilatation or surrounding inflammatory changes. Spleen: Normal in size without focal abnormality. Adrenals/Urinary Tract: Adrenal glands are normal bilaterally. There is some stranding about both kidneys. No focal mass lesion is present. There is no hydronephrosis. No stones are present. Ureters are within normal limits bilaterally. The urinary bladder is unremarkable. Stomach/Bowel: Stomach and duodenum are within normal limits. The small bowel is unremarkable. The terminal ileum is within normal limits. Moderate stool is present throughout the colon. There is no obstruction. There  is transition point along the sigmoid colon which may be due to adhesions. The distal sigmoid colon and rectum are decompressed. Vascular/Lymphatic: Atherosclerotic calcifications are present in the aorta and branch vessels without aneurysm. No significant adenopathy is present. Reproductive: Prostate is unremarkable. Other: No significant effusions are present. No significant ventral hernia is present. Musculoskeletal: Degenerative changes are again noted in the lumbar spine. Bilateral L5 pars defects are present. There is no significant interval change. IMPRESSION: 1. Moderate stool throughout the colon. There is a relative transition point in the sigmoid without obstruction. This could be due to adhesions. 2. No other acute or focal lesion to explain abdominal distention or leukocytosis. 3. Right greater than left basilar airspace disease likely reflects atelectasis. Infection is not excluded on the right. 4. Aortic Atherosclerosis (ICD10-I70.0). No significant aneurysm is present. 5. Coronary artery disease. 6. TAVR 7. Mild inflammatory  changes about the kidneys and in the mesentery likely reflects mild edema. Electronically Signed   By: San Morelle M.D.   On: 11/08/2017 15:10   Dg Chest 1 View  Result Date: 11/07/2017 CLINICAL DATA:  Respiratory distress EXAM: CHEST  1 VIEW COMPARISON:  Oct 23, 2017 FINDINGS: There is slight scarring in the lower lung zones. There is no edema or consolidation. Heart is mildly enlarged with pulmonary vascularity normal. Pacemaker leads attached right atrium and right ventricle. There is a prosthetic aortic valve. There is aortic atherosclerosis. No. Bones appear osteoporotic. IMPRESSION: Slight scarring in the lower lung zones. No edema or consolidation. Stable cardiac prominence. Pacemaker leads attached to right atrium right ventricle. Aortic valve replacement present. There is aortic atherosclerosis. Aortic Atherosclerosis (ICD10-I70.0). Electronically Signed   By: Lowella Grip III M.D.   On: 11/07/2017 09:35   Dg Lumbar Spine Complete  Result Date: 11/07/2017 CLINICAL DATA:  Low back pain.  Worsening for 3 days. EXAM: LUMBAR SPINE - COMPLETE 4+ VIEW COMPARISON:  10/14/2017 FINDINGS: Again noted is some levoscoliosis in the lumbar spine. The curvature is similar to the comparison examination. Again noted is significant anterolisthesis at L5-S1 measuring roughly 1.8 cm and unchanged. This is compatible with grade 2 or grade 3 spondylolisthesis. Chronic disc space narrowing and endplate changes at K0-U5 and L2-L3. Mild retrolisthesis at L2-L3. The vertebral body heights are maintained. Limited evaluation for pars defects but suspect pars defects at L5 associated with the anterolisthesis. Large amount of stool in the abdomen. IMPRESSION: Stable appearance of the lumbar spine without acute abnormality. Scoliosis with multilevel degenerative disease and significant anterolisthesis at L5-S1. Large stool burden. Electronically Signed   By: Markus Daft M.D.   On: 11/07/2017 09:38   Ct Lumbar Spine  Wo Contrast  Result Date: 11/07/2017 CLINICAL DATA:  Severe low back pain. Abnormal lumbar spine radiographs. EXAM: CT LUMBAR SPINE WITHOUT CONTRAST TECHNIQUE: Multidetector CT imaging of the lumbar spine was performed without intravenous contrast administration. Multiplanar CT image reconstructions were also generated. COMPARISON:  Lumbar spine radiographs 11/07/2017. CT of the abdomen and pelvis 03/19/2016. FINDINGS: Segmentation: 5 non rib-bearing lumbar type vertebral bodies are present. Alignment: Grade 2 anterolisthesis at L5-S1 measures 18 mm, associated with bilateral L5 pars defects. Slight retrolisthesis at L1-2 and L2-3 is stable. Levoconvex curvature of the lumbar spine is centered at L3. There is rightward curvature centered at L1. Vertebrae: Chronic sclerotic changes are again noted on the left at L1-2 and more diffusely at L2-3. Chronic sclerotic changes are present at L5-S1. Vertebral body heights are maintained. No acute fracture is present. Paraspinal and other  soft tissues: Bilateral pleural thickening is present. The heart is enlarged. Pacing wires are in place. Visualized abdominal organs are within normal limits. Atherosclerotic calcifications are present in the aorta without aneurysm. No significant adenopathy is present. Disc levels: T12-L1: Negative. L1-2: Asymmetric endplate changes are present on the left. The central canal is patent. Moderate bilateral foraminal stenosis is stable. L2-3: Slight retrolisthesis is present. Facet hypertrophy is asymmetric on the right. This results in moderate right and mild left subarticular narrowing. Moderate foraminal stenosis is present on the right, unchanged. Mild left foraminal narrowing is present. L3-4: A rightward disc protrusion is present. Mild facet hypertrophy is noted. This results in mild right subarticular narrowing. Mild foraminal narrowing bilaterally is stable. L4-5: Mild disc bulging and left greater than right facet hypertrophy are  present. Central canal is patent. Mild foraminal narrowing is worse on the left. L5-S1: Grade 2 anterolisthesis is present. The central canal is patent. Severe left and moderate right foraminal stenosis is present. IMPRESSION: 1. Stable multilevel spondylosis of the lumbar spine. 2. Bilateral pars defects at L5 with grade 2 anterolisthesis measuring up to 18 mm. 3. Severe left and moderate right foraminal stenosis at L5-S1 is similar the prior study. 4. Mild foraminal narrowing at L4-5 is worse on the left. 5. Mild right subarticular narrowing and mild bilateral foraminal stenosis at L3-4 is stable. 6. Moderate right and mild left subarticular and foraminal narrowing at L2-3 is similar the prior study. 7. Moderate bilateral foraminal stenosis at L1-2 is stable. Electronically Signed   By: San Morelle M.D.   On: 11/07/2017 18:25   US Renal  Result Date: 11/07/2017 CLINICAL DATA:  Urinary retention. EXAM: RENAL / URINARY TRACT ULTRASOUND COMPLETE COMPARISON:  None. FINDINGS: Right Kidney: Length: 9.1 cm. Echogenicity within normal limits. No mass or hydronephrosis visualized. Left Kidney: Length: 10.1 cm. Echogenicity within normal limits. No mass or hydronephrosis visualized. Bladder: Empty and not well visualized on this exam. IMPRESSION: Normal appearance of both kidneys.  No evidence of hydronephrosis. Empty urinary bladder. Electronically Signed   By: Earle Gell M.D.   On: 11/07/2017 13:46   Assessment and Plan:   1.  Enterococcal bacteremia: -Patient admitted for 3-day history of intractable low back pain on 11/07/17 subsequently found to have leukocytosis and positive blood cultures for enterococcus with vancomycin resistance.  Coccobacillus detected and second set of aerobic bottle with infectious disease following with concern for discitis versus endocarditis.  -Plan is for TEE for further evaluation however platelets on admission found to be 60 with subsequent dropped to 50>51>54 today.  TEE tentatively scheduled for 11/10/17 at 2pm with Dr. Acie Fredrickson. Will place orders and make NPO and evaluate platelet count in the AM. Family aware of process.  - After careful review of history and examination, the risks and benefits of transesophageal echocardiogram have been explained including risks of esophageal damage, perforation (1:10,000 risk), bleeding, pharyngeal hematoma as well as other potential complications associated with conscious sedation including aspiration, arrhythmia, respiratory failure and death. Alternatives to treatment were discussed, questions were answered. Patient is willing to proceed.   -Per chart review, if discitis (not suggested on MRI however in the setting of acute and worsening low back pain and bacteremia) no indications for neurosurgery and will be followed as outpatient -Patient started on ampicillin 2 g IV every 6 per infectious disease with further hold on antibiotic pending TEE for antibiotic guidance  2.  Thrombocytopenia: -Likely in the setting of acute infection/bacteremia -Platelets, 60>50>51>54 -Patient in need  of TEE for further evaluation of enterococcus bacteremia with questionable endocarditis however, will need to be cautious given low platelet count and the increased risk of bleeding. -Per oncology note, patient is not in need of platelet transfusion unless platelet count dropped to lower than 10,000 or s/s of bleeding -Eliquis on hold -Per primary team, oncology  3.  Elevated troponin with a history of CAD: -Troponins remain flat with no evidence of ACS symptoms.  Elevation likely in the setting of demand ischemia secondary to acute infectious process -Troponin, 0.20>0.06>0.08>0.08 -Not on ASA due to contaminant Eliquis which is currently on hold secondary to thrombocytopenia  -EKG with no acute ischemic changes -Continue Toprol-XL 25, atorvastatin -Will continue to follow  4.  Chronic combined CHF with cardiomyopathy: -LV function has  continued to decline over the last several years.  He has had no anginal symptoms.  Per chart review, discussion was made with primary cardiologist with no plans for further invasive evaluation as this would not change management or his overall quality of life. -Last echocardiogram 10/24/2017 with LVEF 35 to 40% with NWMA and G1 DD -Plan for medical management for now.  Amlodipine stopped at last office visit secondary to low EF. -Spironolactone 12.5 mg daily added -She has known intolerances to ACE-I/ARBs -Weight, 140lb, this appears to be around his baseline weight -I&O, net -695 mL since admission, 24-hour total output 225mL -No evidence of fluid volume overload on exam  5.  Paroxysmal atrial fibrillation with amiodarone toxicity: -Per chart review, pt has not been a candidate for multiple antiarrhythmics secondary to his known CAD. Amiodarone was stopped in the setting of possible amiodarone toxicity -Prior to admission, patient reports compliance with Eliquis -Per telemetry review, patient has been in atrial fibrillation has now converted to NSR with atrial pacing  6.  Low back pain: -Patient continues to have acute, intermittent low back pain which she is receiving pain medication -Questionable referred pain? if endocarditis  -Per primary team  7.  Nausea: -Per family, patient has not had much to eat in several days -Recently had a tuna sandwich for lunch with subsequent nausea -Zofran PRN  -Will consider clear liquids for now and advance as tolerated  -Will make NPO for TEE tomorrow pending platelet count  For questions or updates, please contact [facility] Please consult [email or web address] for contact info under Cardiology/STEMI.   Signed, Kathyrn Drown NP-C [facility] Pager: [phone] 11/09/2017 11:18 AM   Pt seen and examined   I agree with findings as noted above by Tonny Branch.  Pt is an 84 yo with multiple medical problems. as noted above   He is s/p TAVR and PPM    Pt  was admitted with back pain   Found to have enterococcal bacteremia.    On exam:   Pt appears frail but in NAD   Nauseated. Lungs: Clear to auscultation  Cardiac:   RRR   No significant murmuas  Chest:  Pacer site is dry, flat.   Abd Benign Ext without edema   Labs signif for plt of 54,000   WBC 17.7     Mild flat elevation of troponin   Will plan for TEE tomorrow if plts do not decline further   Pt has significant CAD   I am not convinced of any active angina  Troponin bump is trivial   Realtively flat   Will continue to follow clinically.  Dorris Carnes

## 2017-11-10 ENCOUNTER — Encounter (HOSPITAL_COMMUNITY)
Admission: EM | Disposition: A | Discharge status: Skilled Nursing Facility | Payer: Self-pay | Source: Home / Self Care | Attending: Internal Medicine

## 2017-11-10 ENCOUNTER — Inpatient Hospital Stay (HOSPITAL_COMMUNITY): Payer: Medicare Other | Admitting: Anesthesiology

## 2017-11-10 ENCOUNTER — Encounter (HOSPITAL_COMMUNITY): Payer: Self-pay

## 2017-11-10 ENCOUNTER — Inpatient Hospital Stay (HOSPITAL_COMMUNITY): Payer: Medicare Other

## 2017-11-10 ENCOUNTER — Other Ambulatory Visit: Payer: Medicare Other

## 2017-11-10 DIAGNOSIS — R9389 Abnormal findings on diagnostic imaging of other specified body structures: Secondary | ICD-10-CM

## 2017-11-10 DIAGNOSIS — I480 Paroxysmal atrial fibrillation: Secondary | ICD-10-CM

## 2017-11-10 DIAGNOSIS — T826XXA Infection and inflammatory reaction due to cardiac valve prosthesis, initial encounter: Secondary | ICD-10-CM

## 2017-11-10 DIAGNOSIS — T826XXD Infection and inflammatory reaction due to cardiac valve prosthesis, subsequent encounter: Secondary | ICD-10-CM

## 2017-11-10 DIAGNOSIS — G8929 Other chronic pain: Secondary | ICD-10-CM

## 2017-11-10 DIAGNOSIS — I38 Endocarditis, valve unspecified: Secondary | ICD-10-CM

## 2017-11-10 DIAGNOSIS — M549 Dorsalgia, unspecified: Secondary | ICD-10-CM

## 2017-11-10 DIAGNOSIS — Z882 Allergy status to sulfonamides status: Secondary | ICD-10-CM

## 2017-11-10 DIAGNOSIS — I340 Nonrheumatic mitral (valve) insufficiency: Secondary | ICD-10-CM

## 2017-11-10 DIAGNOSIS — I330 Acute and subacute infective endocarditis: Secondary | ICD-10-CM

## 2017-11-10 DIAGNOSIS — Z881 Allergy status to other antibiotic agents status: Secondary | ICD-10-CM

## 2017-11-10 DIAGNOSIS — D696 Thrombocytopenia, unspecified: Secondary | ICD-10-CM

## 2017-11-10 HISTORY — DX: Endocarditis, valve unspecified: I38

## 2017-11-10 HISTORY — DX: Infection and inflammatory reaction due to cardiac valve prosthesis, initial encounter: T82.6XXA

## 2017-11-10 HISTORY — PX: TEE WITHOUT CARDIOVERSION: SHX5443

## 2017-11-10 LAB — PROCALCITONIN: Procalcitonin: 0.51 ng/mL

## 2017-11-10 LAB — CBC WITH DIFFERENTIAL/PLATELET
ABS IMMATURE GRANULOCYTES: 0.2 10*3/uL — AB (ref 0.0–0.1)
Basophils Absolute: 0 10*3/uL (ref 0.0–0.1)
Basophils Relative: 0 %
Eosinophils Absolute: 0 10*3/uL (ref 0.0–0.7)
Eosinophils Relative: 0 %
HCT: 37.9 % — ABNORMAL LOW (ref 39.0–52.0)
HEMOGLOBIN: 12.1 g/dL — AB (ref 13.0–17.0)
Immature Granulocytes: 1 %
LYMPHS PCT: 4 %
Lymphs Abs: 0.6 10*3/uL — ABNORMAL LOW (ref 0.7–4.0)
MCH: 25.9 pg — ABNORMAL LOW (ref 26.0–34.0)
MCHC: 31.9 g/dL (ref 30.0–36.0)
MCV: 81 fL (ref 78.0–100.0)
Monocytes Absolute: 1.1 10*3/uL — ABNORMAL HIGH (ref 0.1–1.0)
Monocytes Relative: 7 %
NEUTROS ABS: 15.4 10*3/uL — AB (ref 1.7–7.7)
Neutrophils Relative %: 88 %
Platelets: 71 10*3/uL — ABNORMAL LOW (ref 150–400)
RBC: 4.68 MIL/uL (ref 4.22–5.81)
RDW: 19.6 % — ABNORMAL HIGH (ref 11.5–15.5)
WBC: 17.3 10*3/uL — AB (ref 4.0–10.5)

## 2017-11-10 LAB — BASIC METABOLIC PANEL
ANION GAP: 12 (ref 5–15)
BUN: 17 mg/dL (ref 6–20)
CO2: 24 mmol/L (ref 22–32)
Calcium: 8.8 mg/dL — ABNORMAL LOW (ref 8.9–10.3)
Chloride: 104 mmol/L (ref 101–111)
Creatinine, Ser: 0.92 mg/dL (ref 0.61–1.24)
GFR calc Af Amer: >60 mL/min (ref >60)
GFR calc non Af Amer: >60 mL/min (ref >60)
Glucose, Bld: 110 mg/dL — ABNORMAL HIGH (ref 65–99)
Potassium: 4.2 mmol/L (ref 3.5–5.1)
SODIUM: 140 mmol/L (ref 135–145)

## 2017-11-10 LAB — PROTIME-INR
INR: 1.16
Prothrombin Time: 14.8 seconds (ref 11.4–15.2)

## 2017-11-10 SURGERY — ECHOCARDIOGRAM, TRANSESOPHAGEAL
Anesthesia: Monitor Anesthesia Care

## 2017-11-10 MED ORDER — BISACODYL 10 MG RE SUPP: 10.0000 mg | Freq: Every day | RECTAL | Status: DC | PRN

## 2017-11-10 MED ORDER — BISACODYL 10 MG RE SUPP
10.0000 mg | Freq: Once | RECTAL | Status: AC
  Administered 2017-11-10: 10 mg via RECTAL
  Filled 2017-11-10: qty 1

## 2017-11-10 MED ORDER — SODIUM CHLORIDE 0.9 % IV SOLN
2.0000 g | Freq: Two times a day (BID) | INTRAVENOUS | Status: DC
  Administered 2017-11-10 – 2017-11-14 (×8): 2 g via INTRAVENOUS
  Filled 2017-11-10 (×9): qty 20

## 2017-11-10 MED ORDER — SODIUM CHLORIDE 0.9 % IV SOLN
INTRAVENOUS | Status: DC | PRN
  Administered 2017-11-10: 13:00:00 via INTRAVENOUS

## 2017-11-10 MED ORDER — LIDOCAINE HCL (CARDIAC) PF 100 MG/5ML IV SOSY
PREFILLED_SYRINGE | INTRAVENOUS | Status: DC | PRN
  Administered 2017-11-10: 40 mg via INTRATRACHEAL

## 2017-11-10 MED ORDER — APIXABAN 2.5 MG PO TABS: 2.5000 mg | ORAL_TABLET | Freq: Two times a day (BID) | ORAL | Status: DC

## 2017-11-10 MED ORDER — PROPOFOL 10 MG/ML IV BOLUS
INTRAVENOUS | Status: DC | PRN
  Administered 2017-11-10 (×4): 10 mg via INTRAVENOUS

## 2017-11-10 NOTE — Anesthesia Procedure Notes (Signed)
Procedure Name: MAC Date/Time: 11/10/2017 2:03 PM Performed by: Neldon Newport, CRNA Pre-anesthesia Checklist: Timeout performed, Patient being monitored, Suction available, Patient identified and Emergency Drugs available Oxygen Delivery Method: Nasal cannula Placement Confirmation: positive ETCO2

## 2017-11-10 NOTE — Progress Notes (Signed)
  Echocardiogram Echocardiogram Transesophageal has been performed.  Ryan [name] 11/10/2017, 2:40 PM

## 2017-11-10 NOTE — Progress Notes (Signed)
Progress Note  Patient Name: Ryan [name] Date of Encounter: 11/10/2017  Primary Cardiologist: Ena Dawley, MD   Subjective   No CP   Breathing is OK     Inpatient Medications    Scheduled Meds: . atorvastatin  20 mg Oral Daily  . budesonide  0.5 mg Nebulization Daily  . famotidine  20 mg Oral Daily  . feeding supplement (ENSURE ENLIVE)  237 mL Oral BID BM  . latanoprost  1 drop Both Eyes QHS  . levothyroxine  12.5 mcg Oral QAC breakfast  . lidocaine  1 patch Transdermal Q24H  . metoprolol succinate  25 mg Oral Daily  . metoprolol tartrate  5 mg Intravenous Once  . mometasone-formoterol  2 puff Inhalation BID  . polyethylene glycol  17 g Oral BID  . predniSONE  40 mg Oral Q breakfast  . vitamin B-12  500 mcg Oral Daily   Continuous Infusions: . ampicillin (OMNIPEN) IV Stopped (11/10/17 1700)  . cefTRIAXone (ROCEPHIN)  IV 2 g (11/10/17 1709)   PRN Meds: acetaminophen, albuterol, bisacodyl, cyclobenzaprine, HYDROmorphone (DILAUDID) injection, lip balm, menthol-cetylpyridinium, nitroGLYCERIN, ondansetron **OR** ondansetron (ZOFRAN) IV, oxyCODONE-acetaminophen, traZODone   Vital Signs    Vitals:   11/10/17 1450 11/10/17 1711 11/10/17 2028 11/10/17 2053  BP: 132/71 (!) 148/63    Pulse: 89 65 64   Resp: (!) 23 16 18    Temp:  98 F (36.7 C) 97.6 F (36.4 C)   TempSrc:  Oral Oral   SpO2: 97% 99% 98% 99%  Weight:      Height:        Intake/Output Summary (Last 24 hours) at 11/10/2017 2112 Last data filed at 11/10/2017 1900 Gross per 24 hour  Intake 2040 ml  Output 900 ml  Net 1140 ml   Filed Weights   11/07/17 0757 11/10/17 1307  Weight: 63.5 kg (140 lb) 63.5 kg (140 lb)    Telemetry    SR - Personally Reviewed  ECG      Physical Exam   GEN:  Thin 84 yo in No acute distress.   Neck: No JVD Cardiac: RRR, no murmurs, rubs, or gallops.  Respiratory: Clear to auscultation bilaterally. GI: Soft, nontender, non-distended  MS: No edema; No  deformity. Neuro:  Nonfocal  Psych: Normal affect   Labs    Chemistry Recent Labs  Lab 11/07/17 0849 11/08/17 0231 11/09/17 0613 11/10/17 0628  NA 141 139 138 140  K 4.0 4.1 4.4 4.2  CL 107 103 103 104  CO2 25 27 26 24   GLUCOSE 97 98 149* 110*  BUN 20 17 24* 17  CREATININE 1.18 1.03 1.13 0.92  CALCIUM 8.7* 8.7* 8.9 8.8*  PROT 6.0*  --  5.9*  --   ALBUMIN 3.0*  --  2.8*  --   AST 48*  --  41  --   ALT 49  --  41  --   ALKPHOS 90  --  87  --   BILITOT 0.9  --  0.9  --   GFRNONAA 54* >60 57* >60  GFRAA >60 >60 >60 >60  ANIONGAP 9 9 9 12      Hematology Recent Labs  Lab 11/08/17 1310 11/09/17 0613 11/10/17 0628  WBC 11.5* 17.7* 17.3*  RBC 4.73 4.84 4.68  HGB 12.1* 12.6* 12.1*  HCT 38.4* 39.8 37.9*  MCV 81.2 82.2 81.0  MCH 25.6* 26.0 25.9*  MCHC 31.5 31.7 31.9  RDW 19.3* 19.2* 19.6*  PLT 51* 54* 71*  Cardiac Enzymes Recent Labs  Lab 11/07/17 0849 11/07/17 1227 11/07/17 1834 11/08/17 0231  TROPONINI 0.20* 0.09* 0.08* 0.08*   No results for input(s): TROPIPOC in the last 168 hours.   BNP Recent Labs  Lab 11/07/17 0849  BNP 219.3*     DDimer  Recent Labs  Lab 11/08/17 1310  DDIMER 2.65*     Radiology    No results found.  Cardiac Studies   TEE  Study Conclusions  - Left ventricle: Systolic function was mildly reduced. The   estimated ejection fraction was in the range of 45% to 50%. - Aortic valve: A bioprosthesis was present. There was a large, 1.8   cm (L) x 1.8 cm (W), multilobulated, highly mobile vegetation on   the aortic aspect; the appearance is consistent with vegetation.   There was a medium-sized, 1.1 cm (L) x 0.4 cm (W), irregular,   mobile vegetation on the left ventricular aspect; the appearance   is consistent with vegetation. There was trivial regurgitation. - Mitral valve: There was mild to moderate regurgitation. - Left atrium: No evidence of thrombus in the atrial cavity or   appendage. - Tricuspid valve: No  evidence of vegetation.  Impressions:  - 3 D images of the Aortic valve were obtained.   There are 2 complex masses associated with the valve.   There is a large , multilobulated vegetation on the supravalvular   aspect of the valve and a small vegetation on the subvalvular   aspect of the valve.   There is trivial perivalvular aortic insufficiency  Patient Profile     84 y.o. male with history of CAD, PAF, PPM, AV dz, s/p TAVR.  Admitted with back pain   Found to be bacteremic      Assessment & Plan    1.  ID   TEE today shows large mobile vegetation on arterial side of AV prosthesis    As noted by Ezzie Dural in previous note plan for ABX therapy with surveillance TEE in future.  2  Paroxysmal atrial fib.  I have reviewed findings above with Drs. Lovena Le and Spurgeon.    Pt had 3 min of atrial fib on last interrogation of pacer. With concerns for bleeding, possible hematoma in back would hold Eliquis for now   In next few wks would resume for stroke prophylaxis from atrial fib  3.  CAD   No symtoms to sugg angina  4  Chronic systolic CHF   Volume status is not bad    Currently only on metoprolol   Was on aldactone prior to admit    Follow for now      For questions or updates, please contact [facility] Please consult [email or web address] for contact info under Cardiology/STEMI.      Signed, Dorris Carnes, MD  11/10/2017, 9:12 PM

## 2017-11-10 NOTE — Anesthesia Preprocedure Evaluation (Signed)
Anesthesia Evaluation  Patient identified by MRN, date of birth, ID band Patient awake    Reviewed: Allergy & Precautions, NPO status , Patient's Chart, lab work & pertinent test results  History of Anesthesia Complications Negative for: history of anesthetic complications  Airway Mallampati: II  TM Distance: >3 FB Neck ROM: Full    Dental  (+) Teeth Intact   Pulmonary shortness of breath, asthma , sleep apnea ,    breath sounds clear to auscultation       Cardiovascular hypertension, Pt. on medications and Pt. on home beta blockers + angina + CAD, + Peripheral Vascular Disease, +CHF and + DOE  + pacemaker  Rhythm:Regular + Systolic murmurs    Neuro/Psych PSYCHIATRIC DISORDERS Depression negative neurological ROS     GI/Hepatic Neg liver ROS, hiatal hernia, GERD  Controlled,  Endo/Other  negative endocrine ROS  Renal/GU negative Renal ROS     Musculoskeletal  (+) Arthritis ,   Abdominal   Peds  Hematology negative hematology ROS (+)   Anesthesia Other Findings   Reproductive/Obstetrics                             Anesthesia Physical  Anesthesia Plan  ASA: III  Anesthesia Plan: MAC   Post-op Pain Management:    Induction: Intravenous  PONV Risk Score and Plan:   Airway Management Planned: Mask and Natural Airway  Additional Equipment:   Intra-op Plan:   Post-operative Plan:   Informed Consent: I have reviewed the patients History and Physical, chart, labs and discussed the procedure including the risks, benefits and alternatives for the proposed anesthesia with the patient or authorized representative who has indicated his/her understanding and acceptance.   Dental advisory given  Plan Discussed with: CRNA  Anesthesia Plan Comments:         Anesthesia Quick Evaluation

## 2017-11-10 NOTE — Progress Notes (Addendum)
PROGRESS NOTE    Ryan [name]  IRJ:[id number] DOB: 01-Oct-1929 DOA: 11/07/2017 PCP: Dorothyann Peng, NP    Brief Narrative:  Ryan [name] is a very pleasant 84 y.o. male with medical history significant for aortic valve stenosis status post TAVR, cardiomyopathy, A. Fib, CHF, chronic low back pain,carotid artery disease status post bilateral CEA, CAD status postPTCA 2017, hypertension since emergency Department chief complaint persistentback pain. [facility] are asked to admit  Information is obtained from the patient the chart and his wife and daughter who at the bedside. 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 He was started on prednisone and spironolactone. He was discharged home and doing better. He states 3 days ago he developed agile worsening left low back pain.  She states he took several doses of hydrocodone without improvement. He describes the pain as sharp intermittent with spasms  Patient admitted with severe back pain, found to have enterococcus bacteremia. Regarding diagnosis of Back pain, differential exacerbation of spinal stenosis, vs discitis, vs spinal epidural hematoma. Neurosurgery recommend oral prednisone for 7 days, continue with IV antibiotics.   Regarding anticoagulation for A fib, please see Dr Ashley Jacobs recommendations. I ask cardiology to comment on anticoagulation risk and recommendation on when to resume anticoagulation.    Assessment & Plan:   Principal Problem:   Enterococcal bacteremia Active Problems:   Severe aortic stenosis   PAF (paroxysmal atrial fibrillation) (HCC)   Chronic combined systolic (congestive) and diastolic (congestive) heart failure (HCC)   CAD (coronary artery disease), native coronary artery   Amiodarone pulmonary toxicity   Intractable back pain   Elevated troponin   Urinary retention   Thrombocytopenia (HCC)   1-Intractable Back pain; could be related to  exacerbation of spondylosis, Vs Discitis, unable to rule out epidural hematoma.  -CT showed spondylosis, mild to moderate multilevel foraminal stenosis, pars defect L 4-5.  -He was started on baclofen, IV morphine, and vicodin, didn't help. Now on low dose dilaudid and flexeril.  -Needs prednisone for back pain for 1 week, then further dose of prednisone for amiodarone toxicity.  -Blood culture positive for enterococcus.  -Neurosurgery has been consulted, they recommend medical management and no need to do CT myelogram.  -CT abdomen negative for retroperitoneal Bleed.  -He was on prednisone for recent prednisone toxicity, he was suppose to be on taper dose. Currently getting 40 mg daily. Will need to taper depending on recommendation by Neurosx, and cardiologist for amiodarone toxicity. -Discussed with Dr Kathyrn Sheriff, unable to rule out discitis with CT spine. Ancillary and secondary test like TAG RBC scan, PET scan, wont change management regarding treatment for discitis. Regarding spinal bleed, patient would have symptoms of cord compression if he  had  large hematoma. We are not able to rule out small hematoma in the spine.  -Dr Kathyrn Sheriff will see patient again 6-11. See his note, and recommendations.  -ESR and CRP elevated.  - ID also recommending repeating CT spine in 2 weeks, to determine length of treatment.  -He might need IV antibiotics treatment for Discitis.   2-Enterococcus Bacteremia;   blood culture grew enteroccocus. Second set gram positive cocci in pair.  ID consulted. Source GI, Urine, cardiac.  Patient started on IV ampicillin. Day 2.  Blood cultures 6-11. Pending.  Cardiology consulted for evaluation of TEE. Patient with pacemaker and recent TAVR.  ID recommend repeating CT spine in 2 weeks.   3-Relative transition point in the sigmoid no evidence of obstruction.  Enterococcus Bacteremia.  -  CT abdomen pelvis.  negative for retroperitoneal bleed. Stool through the colon,  relative transition point, no obstruction. Discussed with radiologist transition point could be physiologic, vs adhesions. . Discussed with family he has prior history of adhesions, prior sx. I also spoke with Dr. Alvy Bimler, she wonder about ischemic bowel. lactic acid  negative. Oncology recommend GI consultation.  -Evaluated by GI, hold on colonoscopy, evaluate for endocarditis and Discitis.  -no BM today. Plan for suppository.    4-Thrombocytopenia;  Dr Lottie Rater consulted/.  Now that blood cultures are positive, thrombocytopenia could be related to infection. Follow trend.  Likely related to infection.   3-Mild elevation of troponin; flat  He denies chest pain.  EKG no significant changes.  Suspect stress related to acute illness.   4-Chronic combine systolic Diastolic HF;  Appears compensated.  Poor oral intake. Hold spironolactone.    5-Paroxysmal A fib;  Hold eliquis due to thrombocytopenia. If patient is consider high risk for stroke by cardiology and they recommend start anticoagulation, will need to resume Eliquis. GI is not planning colonoscopy.   6-Urine retention; he has been able to urinate.   7-Constipation; Miralax, Senakot , and PRN dulcolax suppository.   8-Chronic Hypoxic Respiratory failure. On Oxygen at home  Recently  diagnosed with amiodarone toxicity. On Prednisone.  Respiratory status stable.     DVT prophylaxis: scd Code Status: full code.  Family Communication: care discussed with daughter, son in law and patient wife.  Disposition Plan: remain inpatient for pain controlled.   Consultants:   Oncology   Neurosurgery    Procedures: none   Antimicrobials:     Subjective: He is doing ok, back pain is less frequent, 7/10.  No BM today.    Objective: Vitals:   11/09/17 2058 11/10/17 0523 11/10/17 0915 11/10/17 0934  BP:  (!) 152/66 (!) 150/74   Pulse:  65 73   Resp:  16 18   Temp:  98.5 F (36.9 C) 97.7 F (36.5 C)   TempSrc:   Oral     SpO2: 96% 97% 98% 99%  Weight:      Height:        Intake/Output Summary (Last 24 hours) at 11/10/2017 1025 Last data filed at 11/10/2017 0641 Gross per 24 hour  Intake 705.67 ml  Output 350 ml  Net 355.67 ml   Filed Weights   11/07/17 0757  Weight: 63.5 kg (140 lb)    Examination:  General exam: NAD Respiratory system: Normal respiratory effort, crackles bases.  Cardiovascular system: S 1, S 2 RRR Gastrointestinal system:BS present, soft, no rigidity  Central nervous system: alert, non focal.  Extremities: Symmetric power.   Skin: few ecchymosis.  Psychiatry: Mood and affect appropriate     Data Reviewed: I have personally reviewed following labs and imaging studies  CBC: Recent Labs  Lab 11/07/17 0849 11/08/17 0231 11/08/17 1310 11/09/17 0613 11/10/17 0628  WBC 11.3* 11.1* 11.5* 17.7* 17.3*  NEUTROABS 9.3*  --  9.9*  --  15.4*  HGB 12.3* 11.7* 12.1* 12.6* 12.1*  HCT 39.5 37.0* 38.4* 39.8 37.9*  MCV 82.3 81.7 81.2 82.2 81.0  PLT 60* 50* 51* 54* 71*   Basic Metabolic Panel: Recent Labs  Lab 11/07/17 0849 11/08/17 0231 11/09/17 0613 11/10/17 0628  NA 141 139 138 140  K 4.0 4.1 4.4 4.2  CL 107 103 103 104  CO2 25 27 26 24   GLUCOSE 97 98 149* 110*  BUN 20 17 24* 17  CREATININE 1.18 1.03 1.13  0.92  CALCIUM 8.7* 8.7* 8.9 8.8*   GFR: Estimated Creatinine Clearance: 51.1 mL/min (by C-G formula based on SCr of 0.92 mg/dL). Liver Function Tests: Recent Labs  Lab 11/07/17 0849 11/09/17 0613  AST 48* 41  ALT 49 41  ALKPHOS 90 87  BILITOT 0.9 0.9  PROT 6.0* 5.9*  ALBUMIN 3.0* 2.8*   No results for input(s): LIPASE, AMYLASE in the last 168 hours. No results for input(s): AMMONIA in the last 168 hours. Coagulation Profile: Recent Labs  Lab 11/07/17 0849 11/08/17 0231 11/10/17 0628  INR 1.33 1.30 1.16   Cardiac Enzymes: Recent Labs  Lab 11/07/17 0849 11/07/17 1227 11/07/17 1834 11/08/17 0231  TROPONINI 0.20* 0.09* 0.08* 0.08*   BNP  (last 3 results) Recent Labs    10/20/17 1058  PROBNP 146.0*   HbA1C: No results for input(s): HGBA1C in the last 72 hours. CBG: No results for input(s): GLUCAP in the last 168 hours. Lipid Profile: No results for input(s): CHOL, HDL, LDLCALC, TRIG, CHOLHDL, LDLDIRECT in the last 72 hours. Thyroid Function Tests: Recent Labs    11/07/17 1834  TSH 2.060   Anemia Panel: No results for input(s): VITAMINB12, FOLATE, FERRITIN, TIBC, IRON, RETICCTPCT in the last 72 hours. Sepsis Labs: Recent Labs  Lab 11/08/17 1720 11/09/17 0613 11/10/17 0628  PROCALCITON  --  0.47 0.51  LATICACIDVEN 1.9  --   --     Recent Results (from the past 240 hour(s))  Culture, blood (routine x 2)     Status: None (Preliminary result)   Collection Time: 11/08/17  9:20 AM  Result Value Ref Range Status   Specimen Description BLOOD RIGHT ANTECUBITAL  Final   Special Requests   Final    BOTTLES DRAWN AEROBIC AND ANAEROBIC Blood Culture adequate volume   Culture  Setup Time   Final    GRAM POSITIVE COCCI IN PAIRS IN BOTH AEROBIC AND ANAEROBIC BOTTLES CRITICAL VALUE NOTED.  VALUE IS CONSISTENT WITH PREVIOUSLY REPORTED AND CALLED VALUE.    Culture   Final    GRAM POSITIVE COCCI CULTURE REINCUBATED FOR BETTER GROWTH Performed at [facility], [place] [address] [place], [place] [address]    Report Status PENDING  Incomplete  Culture, blood (routine x 2)     Status: None (Preliminary result)   Collection Time: 11/08/17  9:27 AM  Result Value Ref Range Status   Specimen Description BLOOD RIGHT HAND  Final   Special Requests   Final    BOTTLES DRAWN AEROBIC ONLY Blood Culture adequate volume   Culture  Setup Time   Final    GRAM POSITIVE COCCOBACILLUS AEROBIC BOTTLE ONLY CRITICAL RESULT CALLED TO, READ BACK BY AND VERIFIED WITH: PHARMD E MARTIN 11/09/17 AT 62 BY CM    Culture   Final    GRAM POSITIVE COCCOBACILLUS IDENTIFICATION AND SUSCEPTIBILITIES TO FOLLOW Performed at [place], [place] [address] [place], [place] [address]    Report Status PENDING  Incomplete  Blood Culture ID Panel (Reflexed)     Status: Abnormal   Collection Time: 11/08/17  9:27 AM  Result Value Ref Range Status   Enterococcus species DETECTED (A) NOT DETECTED Final    Comment: RBV PHARMD E MARTIN 11/09/17 AT 838 BY CM    Vancomycin resistance NOT DETECTED NOT DETECTED Final   Listeria monocytogenes NOT DETECTED NOT DETECTED Final   Staphylococcus species NOT DETECTED NOT DETECTED Final   Staphylococcus aureus NOT DETECTED NOT DETECTED Final   Streptococcus species  NOT DETECTED NOT DETECTED Final   Streptococcus agalactiae NOT DETECTED NOT DETECTED Final   Streptococcus pneumoniae NOT DETECTED NOT DETECTED Final   Streptococcus pyogenes NOT DETECTED NOT DETECTED Final   Acinetobacter baumannii NOT DETECTED NOT DETECTED Final   Enterobacteriaceae species NOT DETECTED NOT DETECTED Final   Enterobacter cloacae complex NOT DETECTED NOT DETECTED Final   Escherichia coli NOT DETECTED NOT DETECTED Final   Klebsiella oxytoca NOT DETECTED NOT DETECTED Final   Klebsiella pneumoniae NOT DETECTED NOT DETECTED Final   Proteus species NOT DETECTED NOT DETECTED Final   Serratia marcescens NOT DETECTED NOT DETECTED Final   Haemophilus influenzae NOT DETECTED NOT DETECTED Final   Neisseria meningitidis NOT DETECTED NOT DETECTED Final   Pseudomonas aeruginosa NOT DETECTED NOT DETECTED Final   Candida albicans NOT DETECTED NOT DETECTED Final   Candida glabrata NOT DETECTED NOT DETECTED Final   Candida krusei NOT DETECTED NOT DETECTED Final   Candida parapsilosis NOT DETECTED NOT DETECTED Final   Candida tropicalis NOT DETECTED NOT DETECTED Final    Comment: Performed at [facility], [place] [address], [place], [place] [address]  Urine Culture     Status: Abnormal   Collection Time: 11/08/17  4:33 PM  Result Value Ref Range Status   Specimen Description URINE, RANDOM  Final   Special Requests NONE   Final   Culture (A)  Final    <10,000 COLONIES/mL INSIGNIFICANT GROWTH Performed at [facility], [place] [address], [place],  [address]    Report Status 11/09/2017 FINAL  Final         Radiology Studies: Ct Abdomen Pelvis Wo Contrast  Result Date: 11/08/2017 CLINICAL DATA:  Abdominal distention.  Leukocytosis. EXAM: CT ABDOMEN AND PELVIS WITHOUT CONTRAST TECHNIQUE: Multidetector CT imaging of the abdomen and pelvis was performed following the standard protocol without IV contrast. COMPARISON:  CT lumbar spine 11/07/2017 FINDINGS: Lower chest: Right greater than left basilar airspace disease is seen present. Small right pleural effusion is present. The heart is enlarged. Pacing wires are present. Coronary artery calcifications are noted. Aortic valve replacement is noted. Hepatobiliary: No focal liver abnormality is seen. No gallstones, gallbladder wall thickening, or biliary dilatation. Pancreas: Unremarkable. No pancreatic ductal dilatation or surrounding inflammatory changes. Spleen: Normal in size without focal abnormality. Adrenals/Urinary Tract: Adrenal glands are normal bilaterally. There is some stranding about both kidneys. No focal mass lesion is present. There is no hydronephrosis. No stones are present. Ureters are within normal limits bilaterally. The urinary bladder is unremarkable. Stomach/Bowel: Stomach and duodenum are within normal limits. The small bowel is unremarkable. The terminal ileum is within normal limits. Moderate stool is present throughout the colon. There is no obstruction. There is transition point along the sigmoid colon which may be due to adhesions. The distal sigmoid colon and rectum are decompressed. Vascular/Lymphatic: Atherosclerotic calcifications are present in the aorta and branch vessels without aneurysm. No significant adenopathy is present. Reproductive: Prostate is unremarkable. Other: No significant effusions are present. No significant ventral  hernia is present. Musculoskeletal: Degenerative changes are again noted in the lumbar spine. Bilateral L5 pars defects are present. There is no significant interval change. IMPRESSION: 1. Moderate stool throughout the colon. There is a relative transition point in the sigmoid without obstruction. This could be due to adhesions. 2. No other acute or focal lesion to explain abdominal distention or leukocytosis. 3. Right greater than left basilar airspace disease likely reflects atelectasis. Infection is not excluded on the right. 4. Aortic  Atherosclerosis (ICD10-I70.0). No significant aneurysm is present. 5. Coronary artery disease. 6. TAVR 7. Mild inflammatory changes about the kidneys and in the mesentery likely reflects mild edema. Electronically Signed   By: San Morelle M.D.   On: 11/08/2017 15:10        Scheduled Meds: . atorvastatin  20 mg Oral Daily  . bisacodyl  10 mg Rectal Once  . budesonide  0.5 mg Nebulization Daily  . famotidine  20 mg Oral Daily  . feeding supplement (ENSURE ENLIVE)  237 mL Oral BID BM  . latanoprost  1 drop Both Eyes QHS  . levothyroxine  12.5 mcg Oral QAC breakfast  . lidocaine  1 patch Transdermal Q24H  . metoprolol succinate  25 mg Oral Daily  . metoprolol tartrate      . metoprolol tartrate  5 mg Intravenous Once  . mometasone-formoterol  2 puff Inhalation BID  . polyethylene glycol  17 g Oral BID  . predniSONE  40 mg Oral Q breakfast  . vitamin B-12  500 mcg Oral Daily   Continuous Infusions: . sodium chloride 20 mL/hr at 11/10/17 0029  . ampicillin (OMNIPEN) IV 2 g (11/10/17 0953)     LOS: 1 day    Time spent: 35 minutes.     Elmarie Shiley, MD [facility] Pager [phone]  If 7PM-7AM, please contact night-coverage [email or web address] Password [facility] 11/10/2017, 10:25 AM

## 2017-11-10 NOTE — Progress Notes (Signed)
Patient not seen. I have reviewed his chart. Platelet count is improving with IV antibiotics. Cause of thrombocytopenia is due to suspected infection. In my opinion, there is no contraindication to remain start him on antiplatelet agents or anticoagulants as long as the platelet is greater than 50,000. Please call if questions arise

## 2017-11-10 NOTE — Progress Notes (Signed)
Patient was alternating between SR and Afib all night long. HR went up to 150s with activities but stayed there for several minutes. Otherwise, HR runs in low 100s with Afib, and 70s with SR

## 2017-11-10 NOTE — Progress Notes (Signed)
  NEUROSURGERY PROGRESS NOTE   No issues overnight. Pt reports mild improvement in back pain since Sunday. Able to ambulate a few steps in the hallway.  EXAM:  BP (!) 150/74 (BP Location: Right Arm)   Pulse 73   Temp 97.7 F (36.5 C) (Oral)   Resp 18   Ht 5\' 5"  (1.651 m)   Wt 63.5 kg (140 lb)   SpO2 99%   BMI 23.30 kg/m   Awake, alert, oriented  Speech fluent, appropriate  CN grossly intact  Good strength BLE  IMPRESSION:  84 y.o. male with bacteremia with multiple medical problems including Afib on Eliquis, Aortic stenosis s/p TAVR, CAD s/p bilateral CEA, CAD s/p stent, OSA, and pulmonary disease from amiodarone on home O2. Acute onset back pain in my mind likely a result of one of three possibilities. I think most likely is exacerbation of relatively severe underlying chronic lumbar spondylosis. Other possibilities include spontaneous spinal EDH related to Eliquis, or discitis. In any case, he remains neurologically intact and a very poor surgical candidate and so in either of the three diagnostic considerations (spondylosis v EDH v discitis) we would treat him non-operatively, with Abx for his bacteremia.  As to the question of restarting his Eliquis, I think the absolute contraindication to restarting Eliquis would be definitive diagnosis of epidural hematoma. We are unable to obtain MRI due to his pacemaker, but there does not appear to be a hematoma on plain CT (which certainly has much lower sensitivity than MRI). Therefore, the decision to restart [facility] is somewhat nebulous. In my opinion, without definitive diagnosis of an EDH, if his overall risk of stroke is relatively high without Eliquis, it might make sense then to restart it. If, on the other hand his overall stroke risk is low without Eliquis, then it may make more sense to remain off the Eliquis.  PLAN: - Cont current non-operative mgmt of back pain including PO pain meds, 5-7d of steroids, and PT/OT - IV Abx  I have  discussed this case with Dr. Tyrell Antonio from the primary service including my recommendations above. Also reviewed the above with the patient, his wife and daughter at bedside today. All their questions were answered.

## 2017-11-10 NOTE — CV Procedure (Signed)
    Transesophageal Echocardiogram Note  Ryan [name] [id number] April 06, 1930  Procedure: Transesophageal Echocardiogram Indications: failure to thrive , bacteremia   Procedure Details Consent: Obtained Time Out: Verified patient identification, verified procedure, site/side was marked, verified correct patient position, special equipment/implants available, Radiology Safety Procedures followed,  medications/allergies/relevent history reviewed, required imaging and test results available.  Performed  Medications:  During this procedure the patient is administered a Propfol drip - total of 250 mg by CRNA   Left Ventrical:  Normal   Mitral Valve: normal.    Mild - mod MR , multiple jets  Aortic Valve: TAVR.   There are 2 vegetations  On the TAVR.   A large , multilobulated mass supravalvular and a smaller , multilobulated mass sub valvular.   Both are c/w vegetations   Tricuspid Valve: normal   Pulmonic Valve: normal   Left Atrium/ Left atrial appendage: no thrombi   Atrial septum: no obvious ASD,     Aorta: mild calcification    Complications: No apparent complications Patient did tolerate procedure well.   Thayer Headings, [name], MD, [facility] 11/10/2017, 2:25 PM

## 2017-11-10 NOTE — Transfer of Care (Signed)
Immediate Anesthesia Transfer of Care Note  Patient: Ryan [name]  Procedure(s) Performed: TRANSESOPHAGEAL ECHOCARDIOGRAM (TEE) (N/A )  Patient Location: Endoscopy Unit  Anesthesia Type:MAC  Level of Consciousness: sedated  Airway & Oxygen Therapy: Patient Spontanous Breathing and Patient connected to nasal cannula oxygen  Post-op Assessment: Report given to RN, Post -op Vital signs reviewed and stable and Patient moving all extremities X 4  Post vital signs: Reviewed and stable  Last Vitals:  Vitals Value Taken Time  BP    Temp    Pulse    Resp    SpO2      Last Pain:  Vitals:   11/10/17 1307  TempSrc: Oral  PainSc: 0-No pain      Patients Stated Pain Goal: 0 (63/33/54 5625)  Complications: No apparent anesthesia complications

## 2017-11-10 NOTE — Progress Notes (Signed)
Daily Rounding Note  11/10/2017, 8:34 AM  LOS: 1 day   SUBJECTIVE:   Chief complaint: Back pain, this continues up to 7/10 intensity.  TEE set for 2 PM today so he is n.p.o. currently. No BMs so it has been about 3 days since he last had a bowel movement, this despite twice daily MiraLAX for the previous 2 days.  Patient denies abdominal pain and nausea.  OBJECTIVE:         Vital signs in last 24 hours:    Temp:  [97.8 F (36.6 C)-98.5 F (36.9 C)] 98.5 F (36.9 C) (06/11 0523) Pulse Rate:  [64-70] 65 (06/11 0523) Resp:  [16-18] 16 (06/11 0523) BP: (134-167)/(56-68) 152/66 (06/11 0523) SpO2:  [95 %-97 %] 97 % (06/11 0523) Last BM Date: 11/08/17(large) Filed Weights   11/07/17 0757  Weight: 140 lb (63.5 kg)   General: Frail, aged but alert and comfortable. Heart: RRR. Chest: Clear bilaterally. Abdomen: Soft.  Bowel sounds scant.  Not tender or distended. Extremities: CCE. Neuro/Psych: Alert.  Oriented x3.  Appropriate.  No gross deficits.  Intake/Output from previous day: 06/10 0701 - 06/11 0700 In: 705.7 [I.V.:305.7; IV Piggyback:400] Out: 350 [Urine:350]  Intake/Output this shift: No intake/output data recorded.  Lab Results: Recent Labs    11/08/17 1310 11/09/17 0613 11/10/17 0628  WBC 11.5* 17.7* 17.3*  HGB 12.1* 12.6* 12.1*  HCT 38.4* 39.8 37.9*  PLT 51* 54* 71*   BMET Recent Labs    11/08/17 0231 11/09/17 0613 11/10/17 0628  NA 139 138 140  K 4.1 4.4 4.2  CL 103 103 104  CO2 27 26 24   GLUCOSE 98 149* 110*  BUN 17 24* 17  CREATININE 1.03 1.13 0.92  CALCIUM 8.7* 8.9 8.8*   LFT Recent Labs    11/07/17 0849 11/09/17 0613  PROT 6.0* 5.9*  ALBUMIN 3.0* 2.8*  AST 48* 41  ALT 49 41  ALKPHOS 90 87  BILITOT 0.9 0.9   PT/INR Recent Labs    11/08/17 0231 11/10/17 0628  LABPROT 16.1* 14.8  INR 1.30 1.16   Hepatitis Panel No results for input(s): HEPBSAG, HCVAB, HEPAIGM, HEPBIGM  in the last 72 hours.  Studies/Results: Ct Abdomen Pelvis Wo Contrast  Result Date: 11/08/2017 CLINICAL DATA:  Abdominal distention.  Leukocytosis. EXAM: CT ABDOMEN AND PELVIS WITHOUT CONTRAST TECHNIQUE: Multidetector CT imaging of the abdomen and pelvis was performed following the standard protocol without IV contrast. COMPARISON:  CT lumbar spine 11/07/2017 FINDINGS: Lower chest: Right greater than left basilar airspace disease is seen present. Small right pleural effusion is present. The heart is enlarged. Pacing wires are present. Coronary artery calcifications are noted. Aortic valve replacement is noted. Hepatobiliary: No focal liver abnormality is seen. No gallstones, gallbladder wall thickening, or biliary dilatation. Pancreas: Unremarkable. No pancreatic ductal dilatation or surrounding inflammatory changes. Spleen: Normal in size without focal abnormality. Adrenals/Urinary Tract: Adrenal glands are normal bilaterally. There is some stranding about both kidneys. No focal mass lesion is present. There is no hydronephrosis. No stones are present. Ureters are within normal limits bilaterally. The urinary bladder is unremarkable. Stomach/Bowel: Stomach and duodenum are within normal limits. The small bowel is unremarkable. The terminal ileum is within normal limits. Moderate stool is present throughout the colon. There is no obstruction. There is transition point along the sigmoid colon which may be due to adhesions. The distal sigmoid colon and rectum are decompressed. Vascular/Lymphatic: Atherosclerotic calcifications are present in the aorta  and branch vessels without aneurysm. No significant adenopathy is present. Reproductive: Prostate is unremarkable. Other: No significant effusions are present. No significant ventral hernia is present. Musculoskeletal: Degenerative changes are again noted in the lumbar spine. Bilateral L5 pars defects are present. There is no significant interval change. IMPRESSION:  1. Moderate stool throughout the colon. There is a relative transition point in the sigmoid without obstruction. This could be due to adhesions. 2. No other acute or focal lesion to explain abdominal distention or leukocytosis. 3. Right greater than left basilar airspace disease likely reflects atelectasis. Infection is not excluded on the right. 4. Aortic Atherosclerosis (ICD10-I70.0). No significant aneurysm is present. 5. Coronary artery disease. 6. TAVR 7. Mild inflammatory changes about the kidneys and in the mesentery likely reflects mild edema. Electronically Signed   By: San Morelle M.D.   On: 11/08/2017 15:10   Scheduled Meds: . atorvastatin  20 mg Oral Daily  . budesonide  0.5 mg Nebulization Daily  . famotidine  20 mg Oral Daily  . feeding supplement (ENSURE ENLIVE)  237 mL Oral BID BM  . latanoprost  1 drop Both Eyes QHS  . levothyroxine  12.5 mcg Oral QAC breakfast  . lidocaine  1 patch Transdermal Q24H  . metoprolol succinate  25 mg Oral Daily  . metoprolol tartrate      . metoprolol tartrate  5 mg Intravenous Once  . mometasone-formoterol  2 puff Inhalation BID  . polyethylene glycol  17 g Oral BID  . predniSONE  40 mg Oral Q breakfast  . vitamin B-12  500 mcg Oral Daily   Continuous Infusions: . sodium chloride 20 mL/hr at 11/10/17 0029  . ampicillin (OMNIPEN) IV Stopped (11/10/17 0500)   PRN Meds:.acetaminophen, albuterol, cyclobenzaprine, HYDROmorphone (DILAUDID) injection, lip balm, menthol-cetylpyridinium, nitroGLYCERIN, ondansetron **OR** ondansetron (ZOFRAN) IV, oxyCODONE-acetaminophen, traZODone  ASSESMENT:   *  Abnormal CT.  Increased stool with ? Sigmoid transition point.  Low suspicion for mass, and non-obstructed.  Miralax added.  *  Enterococcal bacteremia. ? Discitis.  TEE planned.  On Ampicillin.       *  Chronic Eliquis. S/p TAVR.  A fib. Pacemaker in situ.      *  Symptomatic spinal stenosis.  ? Discitis.  On Prednisone.    *   Acute  thrombocytopenia in 50s, improved now in 34s.   PLAN   *   Add Dulcolax per rectum, give the first dose this afternoon after he returns from TEE.    Ryan [name]  11/10/2017, 8:34 AM Phone [phone]

## 2017-11-10 NOTE — Progress Notes (Signed)
[facility] for Infectious Disease  Date of Admission:  11/07/2017   Total days of antibiotics 1        Day 1 ampicillin          Patient ID: Ryan [name] is a 84 y.o. male with  Principal Problem:   Prosthetic valve endocarditis ([place]) Active Problems:   Enterococcal bacteremia   Subacute bacterial endocarditis   Severe aortic stenosis   PAF (paroxysmal atrial fibrillation) (HCC)   Chronic combined systolic (congestive) and diastolic (congestive) heart failure (HCC)   CAD (coronary artery disease), native coronary artery   Amiodarone pulmonary toxicity   Intractable back pain   Elevated troponin   Urinary retention   Thrombocytopenia ([place])   . atorvastatin  20 mg Oral Daily  . bisacodyl  10 mg Rectal Once  . budesonide  0.5 mg Nebulization Daily  . famotidine  20 mg Oral Daily  . feeding supplement (ENSURE ENLIVE)  237 mL Oral BID BM  . latanoprost  1 drop Both Eyes QHS  . levothyroxine  12.5 mcg Oral QAC breakfast  . lidocaine  1 patch Transdermal Q24H  . metoprolol succinate  25 mg Oral Daily  . metoprolol tartrate  5 mg Intravenous Once  . mometasone-formoterol  2 puff Inhalation BID  . polyethylene glycol  17 g Oral BID  . predniSONE  40 mg Oral Q breakfast  . vitamin B-12  500 mcg Oral Daily    SUBJECTIVE: Doing OK. Back pain still improved compared to admission. His wife is present assisting him to get up and down to restroom. Sleepy from procedure still.  TEE positive for endocarditis involving prosthetic valve.   Allergies  Allergen Reactions  . Ciprofloxacin Anaphylaxis  . Hydrochlorothiazide Anaphylaxis  . Sulfa Antibiotics Anaphylaxis  . Ace Inhibitors Cough  . Losartan Cough  . Carvedilol Cough    Pt states it "causes him too cough."  . Lisinopril Cough    Pt reports worsening cough and "feeling wheezy."    OBJECTIVE: Vitals:   11/10/17 1430 11/10/17 1435 11/10/17 1440 11/10/17 1450  BP: 106/72 131/64 132/62 132/71  Pulse: 96  97 (!) 103 89  Resp: 19 19 16  (!) 23  Temp:      TempSrc:      SpO2: 93% 95% 97% 97%  Weight:      Height:       Body mass index is 23.3 kg/m.  Physical Exam  Constitutional: He is oriented to person, place, and time. No distress.  Chronically ill appearing elderly man. Resting in bed comfortably.   HENT:  Mouth/Throat: No oropharyngeal exudate.  Eyes: Pupils are equal, round, and reactive to light.  Cardiovascular: Normal heart sounds. An irregular rhythm present. Tachycardia present.  Pulmonary/Chest: Effort normal. No respiratory distress.  Abdominal: Soft. Bowel sounds are normal.  Musculoskeletal:  Limited mobility d/t back pain   Neurological: He is alert and oriented to person, place, and time.  Skin: Skin is warm and dry.  Psychiatric: He has a normal mood and affect.    Lab Results Lab Results  Component Value Date   WBC 17.3 (H) 11/10/2017   HGB 12.1 (L) 11/10/2017   HCT 37.9 (L) 11/10/2017   MCV 81.0 11/10/2017   PLT 71 (L) 11/10/2017    Lab Results  Component Value Date   CREATININE 0.92 11/10/2017   BUN 17 11/10/2017   NA 140 11/10/2017   K 4.2 11/10/2017   CL 104  11/10/2017   CO2 24 11/10/2017    Lab Results  Component Value Date   ALT 41 11/09/2017   AST 41 11/09/2017   ALKPHOS 87 11/09/2017   BILITOT 0.9 11/09/2017     Microbiology: BCx 6/9 >> 1/2 confirmed enterococcus faecalis (2nd set pending ID)  BCx 6/10 >> pending   ASSESSMENT: 84 y.o. male admitted with severe acute on chronic lower back pain. Found to have leukocytosis and enterococcal bacteremia. Enterococcus faecalis confirmed in 1/2 with the second set indicating gram positive cocci in chains with organism ID pending --> strong likelihood this is also enterococcus. Urine does not coincide with bacteremia making urinary source less likely. TEE is positive for vegetations involving the prosthetic valve. With creatinine clearance just at 50 mL/min will add ceftriaxone 2gm IV q12h WITH  ampicillin for synergy instead of gentamicin with risk for nephrotoxicity. He will need longer course (6 weeks) of IV therapy with PVE.   I welcomed and answered all questions he and his wife had today. They are aware of treatment course. Plan is to discharge initially to SNF/Rehab considering his mobility limitations. Can get IV antibiotics at facility.   PLAN: 1. Enterococcal Bacteremia = TEE positive for endocarditis involving prosthetic valve. Add ceftriaxone 2gm Q12h. Will need 6 weeks of dual IV therapy. Hold on PICC until cultures are clear. Care management consulted for home therapy.   2. Thrombocytopenia = improved to 70k this morning with adding antibiotics. Likely related to infectious process. Continue to trend.   3. Back Pain = ?discitis vs acute on chronic exacerbation of degenerative issues. Continue antibiotics. Pain management per primary team appreciated. Treatment for endocarditis will provide adequate treatment for this as well although would favor that this is more of an acute flare of a chronic degenerative problem.   Janene Madeira, MSN, NP-C [facility] for Infectious [place] Cell: [phone] Pager: [phone]  11/10/2017  3:44 PM

## 2017-11-10 NOTE — Progress Notes (Signed)
Structural heart team note:  Notified of patient's admission for enterococcal bacteremia and endocarditis. He is known to our service after undergoing TAVR in January 2018 with a 26 mm Sapien 3 transcatheter heart valve. He has a complex cardiac history and has undergone PCI and permanent pacemaker placement as well. He presents now with progressive weakness and severe low back pain, BCx's positive for enterococcus. TEE today shows a small vegetation on the ventricular side of his aortic prosthesis and a large vegetation on the aortic side of the TAVR valve. TAVR valve function appears normal without significant stenosis or regurgitation. I spoke to the patient and his wife at the bedside and agree with the plans outlined by the [facility] and ID services. It seems that 6 weeks of IV antibiotics with ceftriazone and ampicillin with a FU surveillance TEE is the most appropriate course. Considering his advanced age, home O2 dependence, and poor functional capacity, I do not think he would be a candidate for cardiac surgical treatment even if he developed an indication for surgery. Appreciate the care of TRH/ID/Cards teams.   Sherren Mocha MD 11/10/2017 4:41 PM

## 2017-11-10 NOTE — Progress Notes (Signed)
Patient went into Afib with HR in 150s. Bodenheimer,  NP was notified and order was received to give Metoprolol 5mg  IV. Patient converted back to SR before the medication was given. Continue to monitor the patient.

## 2017-11-11 ENCOUNTER — Inpatient Hospital Stay: Payer: Medicare Other | Admitting: Adult Health

## 2017-11-11 DIAGNOSIS — I5042 Chronic combined systolic (congestive) and diastolic (congestive) heart failure: Secondary | ICD-10-CM

## 2017-11-11 DIAGNOSIS — Z5181 Encounter for therapeutic drug level monitoring: Secondary | ICD-10-CM

## 2017-11-11 DIAGNOSIS — R748 Abnormal levels of other serum enzymes: Secondary | ICD-10-CM

## 2017-11-11 DIAGNOSIS — Z0289 Encounter for other administrative examinations: Secondary | ICD-10-CM

## 2017-11-11 LAB — GLUCOSE, CAPILLARY: Glucose-Capillary: 135 mg/dL — ABNORMAL HIGH (ref 65–99)

## 2017-11-11 LAB — CULTURE, BLOOD (ROUTINE X 2)
SPECIAL REQUESTS: ADEQUATE
Special Requests: ADEQUATE

## 2017-11-11 LAB — PROCALCITONIN: PROCALCITONIN: 0.56 ng/mL

## 2017-11-11 MED ORDER — POLYETHYLENE GLYCOL 3350 17 G PO PACK: 17.0000 g | PACK | Freq: Every day | ORAL | Status: DC | PRN

## 2017-11-11 NOTE — Anesthesia Postprocedure Evaluation (Signed)
Anesthesia Post Note  Patient: Ryan [name]  Procedure(s) Performed: TRANSESOPHAGEAL ECHOCARDIOGRAM (TEE) (N/A )     Patient location during evaluation: Endoscopy Anesthesia Type: MAC Level of consciousness: awake Pain management: pain level controlled Vital Signs Assessment: post-procedure vital signs reviewed and stable Respiratory status: spontaneous breathing Cardiovascular status: stable Postop Assessment: no apparent nausea or vomiting Anesthetic complications: no    Last Vitals:  Vitals:   11/11/17 0913 11/11/17 0941  BP:  (!) 167/72  Pulse:  66  Resp:  20  Temp:  37.2 C  SpO2: 96% 100%    Last Pain:  Vitals:   11/11/17 0832  TempSrc:   PainSc: 5    Pain Goal: Patients Stated Pain Goal: 0 (11/07/17 2310)               Zakaria Fromer JR,JOHN Mateo Flow

## 2017-11-11 NOTE — Progress Notes (Signed)
[facility] for Infectious Disease   Reason for visit: Follow up on prosthetic valve endocarditis  Interval History: no new culture growth from 6/11 blood cultures; no fever, no chills.  No new events.   Physical Exam: Constitutional:  Vitals:   11/11/17 0913 11/11/17 0941  BP:  (!) 167/72  Pulse:  66  Resp:  20  Temp:  98.9 F (37.2 C)  SpO2: 96% 100%   patient appears in NAD Eyes: anicteric HENT: no thrush Respiratory: Normal respiratory effort; CTA B Cardiovascular: RRR  Review of Systems: Gastrointestinal: negative for nausea and diarrhea Integument/breast: negative for rash  Lab Results  Component Value Date   WBC 17.3 (H) 11/10/2017   HGB 12.1 (L) 11/10/2017   HCT 37.9 (L) 11/10/2017   MCV 81.0 11/10/2017   PLT 71 (L) 11/10/2017    Lab Results  Component Value Date   CREATININE 0.92 11/10/2017   BUN 17 11/10/2017   NA 140 11/10/2017   K 4.2 11/10/2017   CL 104 11/10/2017   CO2 24 11/10/2017    Lab Results  Component Value Date   ALT 41 11/09/2017   AST 41 11/09/2017   ALKPHOS 87 11/09/2017     Microbiology: Recent Results (from the past 240 hour(s))  Culture, blood (routine x 2)     Status: Abnormal   Collection Time: 11/08/17  9:20 AM  Result Value Ref Range Status   Specimen Description BLOOD RIGHT ANTECUBITAL  Final   Special Requests   Final    BOTTLES DRAWN AEROBIC AND ANAEROBIC Blood Culture adequate volume   Culture  Setup Time   Final    GRAM POSITIVE COCCI IN PAIRS IN BOTH AEROBIC AND ANAEROBIC BOTTLES CRITICAL VALUE NOTED.  VALUE IS CONSISTENT WITH PREVIOUSLY REPORTED AND CALLED VALUE. Performed at [facility], [place] [address] [place], [place] [address]    Culture (A)  Final    ENTEROCOCCUS FAECALIS SUSCEPTIBILITIES PERFORMED ON PREVIOUS CULTURE WITHIN THE LAST 5 DAYS.    Report Status 11/11/2017 FINAL  Final  Culture, blood (routine x 2)     Status: Abnormal   Collection Time: 11/08/17  9:27 AM  Result Value Ref  Range Status   Specimen Description BLOOD RIGHT HAND  Final   Special Requests   Final    BOTTLES DRAWN AEROBIC ONLY Blood Culture adequate volume   Culture  Setup Time   Final    GRAM POSITIVE COCCOBACILLUS AEROBIC BOTTLE ONLY CRITICAL RESULT CALLED TO, READ BACK BY AND VERIFIED WITH: PHARMD E MARTIN 11/09/17 AT 838 BY CM Performed at [facility], [place] [address] [place], [place] [address]    Culture ENTEROCOCCUS FAECALIS (A)  Final   Report Status 11/11/2017 FINAL  Final   Organism ID, Bacteria ENTEROCOCCUS FAECALIS  Final      Susceptibility   Enterococcus faecalis - MIC*    AMPICILLIN <=2 SENSITIVE Sensitive     VANCOMYCIN 2 SENSITIVE Sensitive     GENTAMICIN SYNERGY SENSITIVE Sensitive     * ENTEROCOCCUS FAECALIS  Blood Culture ID Panel (Reflexed)     Status: Abnormal   Collection Time: 11/08/17  9:27 AM  Result Value Ref Range Status   Enterococcus species DETECTED (A) NOT DETECTED Final    Comment: RBV PHARMD E MARTIN 11/09/17 AT 838 BY CM    Vancomycin resistance NOT DETECTED NOT DETECTED Final   Listeria monocytogenes NOT DETECTED NOT DETECTED Final   Staphylococcus species NOT DETECTED NOT DETECTED Final  Staphylococcus aureus NOT DETECTED NOT DETECTED Final   Streptococcus species NOT DETECTED NOT DETECTED Final   Streptococcus agalactiae NOT DETECTED NOT DETECTED Final   Streptococcus pneumoniae NOT DETECTED NOT DETECTED Final   Streptococcus pyogenes NOT DETECTED NOT DETECTED Final   Acinetobacter baumannii NOT DETECTED NOT DETECTED Final   Enterobacteriaceae species NOT DETECTED NOT DETECTED Final   Enterobacter cloacae complex NOT DETECTED NOT DETECTED Final   Escherichia coli NOT DETECTED NOT DETECTED Final   Klebsiella oxytoca NOT DETECTED NOT DETECTED Final   Klebsiella pneumoniae NOT DETECTED NOT DETECTED Final   Proteus species NOT DETECTED NOT DETECTED Final   Serratia marcescens NOT DETECTED NOT DETECTED Final   Haemophilus influenzae NOT DETECTED  NOT DETECTED Final   Neisseria meningitidis NOT DETECTED NOT DETECTED Final   Pseudomonas aeruginosa NOT DETECTED NOT DETECTED Final   Candida albicans NOT DETECTED NOT DETECTED Final   Candida glabrata NOT DETECTED NOT DETECTED Final   Candida krusei NOT DETECTED NOT DETECTED Final   Candida parapsilosis NOT DETECTED NOT DETECTED Final   Candida tropicalis NOT DETECTED NOT DETECTED Final    Comment: Performed at [facility], [place] [address] [place], [place] [address]  Urine Culture     Status: Abnormal   Collection Time: 11/08/17  4:33 PM  Result Value Ref Range Status   Specimen Description URINE, RANDOM  Final   Special Requests NONE  Final   Culture (A)  Final    <10,000 COLONIES/mL INSIGNIFICANT GROWTH Performed at [facility], [place] [address] [place] [address], [place], [place] [address]    Report Status 11/09/2017 FINAL  Final    Impression/Plan:  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  Follow up TEE per cardiology prior to stopping treatment in about 5-6 weeks.    2.  Enterococcal endocarditis - on ampicillin and ceftriaxone for dual beta lactam therapy.  Will need 6 weeks through July 22nd with follow up TEE as above.   If TEE ok then, will stop antibiotics. Will do surveillance blood cultures 3-5 days after antibiotic completion.  3.  Medication monitoring - will need weekly cbc, cmp on antibiotics at discharge.    4.  Thrombocytopenia - has been trending up, likely due to infection.  Continue to monitor.  5.  Back pain - improved.  Doubt discitis based on CT, clinical exam.   6.  Access - picc line ok 6/14 if blood cultures from 6/11 remain negative.

## 2017-11-11 NOTE — Care Management Note (Signed)
Case Management Note  Patient Details  Name: Ryan [name] MRN: [id number] Date of Birth: 05/12/1930  Subjective/Objective:               Spoke w patient wife and daughter at the bedside. They would like to go to SNF at DC based off physical deconditioning, and level of assistance needed in immediate post discharge care. They would like to take home once he has more strength and are willing to finish his IV Abx at home. Daughter states she lives behind parents and will be available to help.    Action/Plan:  DC to SNF Expected Discharge Date:  11/03/17               Expected Discharge Plan:  Skilled Nursing Facility  In-House Referral:  Clinical Social Work  Discharge planning Services  CM Consult  Post Acute Care Choice:    Choice offered to:     DME Arranged:    DME Agency:     HH Arranged:    [place] Agency:     Status of Service:  Completed, signed off  If discussed at [employer], dates discussed:    Additional Comments:  Carles Collet, RN 11/11/2017, 10:00 AM

## 2017-11-11 NOTE — Progress Notes (Signed)
PROGRESS NOTE    Ryan [name]  SWH:[id number] DOB: 01-15-1930 DOA: 11/07/2017 PCP: Dorothyann Peng, NP    Brief Narrative:  Ryan [name] is a very pleasant 84 y.o. male with medical history significant for aortic valve stenosis status post TAVR, cardiomyopathy, A. Fib, CHF, chronic low back pain,carotid artery disease status post bilateral CEA, CAD status postPTCA 2017, hypertension presented to ED due to persistent back pain.  Patient was recently admitted for suspected amiodarone induced lung toxicity at which time he was started on prednisone and spironolactone.  He was doing better until 3 days PTA when he developed worsening left low back pain.  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 Regarding diagnosis of Back pain, differential diagnosis of spinal stenosis, vs discitis, vs spinal epidural hematoma. Neurosurgery recommend oral prednisone for 7 days, continue with IV antibiotics.  ID, cardiology and neurosurgery consulted.   Assessment & Plan:   Principal Problem:   Prosthetic valve endocarditis (HCC) Active Problems:   Severe aortic stenosis   PAF (paroxysmal atrial fibrillation) (HCC)   Chronic combined systolic (congestive) and diastolic (congestive) heart failure (HCC)   CAD (coronary artery disease), native coronary artery   Amiodarone pulmonary toxicity   Intractable back pain   Elevated troponin   Urinary retention   Thrombocytopenia (HCC)   Enterococcal bacteremia   Subacute bacterial endocarditis   Intractable Back pain; could be related to exacerbation of spondylosis, Vs Discitis, unable to rule out epidural hematoma.  -CT showed spondylosis, mild to moderate multilevel foraminal stenosis, pars defect L 4-5.  -He was started on baclofen, IV morphine, and vicodin, didn't help. Now on low dose dilaudid and flexeril.  -Needs prednisone for back pain for 1 week, then further dose of prednisone for amiodarone toxicity.  -Blood culture positive for  enterococcus.  -Neurosurgery has been consulted, they recommend medical management and no need to do CT myelogram.  -CT abdomen negative for retroperitoneal Bleed.  -He was on prednisone for recent prednisone toxicity, he was supposed to be on taper dose. Currently getting 40 mg daily. Will need to taper depending on recommendation by Neurosx, and cardiologist for amiodarone toxicity. -Discussed with Dr Kathyrn Sheriff, unable to rule out discitis with CT spine. Ancillary and secondary test like TAG RBC scan, PET scan, wont change management regarding treatment for discitis. Regarding spinal bleed, patient would have symptoms of cord compression if he had  large hematoma. We are not able to rule out small hematoma in the spine.  -Dr Kathyrn Sheriff, neurosurgery reassessed on 6/11 and indicates that his acute onset of back pain is due to 1 of 3 possibilities: Exacerbation of chronic lumbar spondylosis, spontaneous spinal EDH related to Eliquis or discitis.  He recommends nonoperative treatment, continue antibiotics for his bacteremia and if stroke risk felt to be high without Eliquis then consider resuming it.  However cardiology has seen and recommend holding Eliquis at this time. -ESR and CRP elevated.  - ID also recommending repeating CT spine in 2 weeks, to determine length of treatment.  -He might need IV antibiotics treatment for Discitis.  -Please see below for antibiotic recommendations by ID.  Back pain slowly improving and reportedly significantly better.  Enterococcal bacteremia with prosthetic aortic valve endocarditis  blood culture grew enteroccocus. Second set gram positive cocci in pair.  As per ID, on ampicillin and ceftriaxone for dual beta-lactam therapy.  Recommend 6 weeks through December 21, 2017 and follow-up TEE by cardiology prior to stopping treatment in about 5 to 6 weeks.  If  TEE okay at that time then stop antibiotics.  Surveillance blood cultures to be drawn 3 to 5 days after antibiotic  completion.  PICC line 6/14 if surveillance blood cultures from 6/11 remain negative.  Will need lab monitoring per OPAT protocol at discharge.  Relative transition point in the sigmoid no evidence of obstruction.  -CT abdomen pelvis.  negative for retroperitoneal bleed. Stool through the colon, relative transition point, no obstruction. Discussed with radiologist transition point could be physiologic, vs adhesions. . Discussed with family he has prior history of adhesions, prior sx. I also spoke with Dr. Alvy Bimler, she wonder about ischemic bowel. lactic acid  negative. Oncology recommend GI consultation.  -Evaluated by GI, hold on colonoscopy, evaluate for endocarditis and Discitis.  -Patient had multiple BMs after Dulcolax suppository overnight.  Patient and family request changing MiraLAX and suppositories to as needed.  Patient also tolerating full liquids and wants to advance diet.  Thrombocytopenia;  Dr Lottie Rater consulted/.  Now that blood cultures are positive, thrombocytopenia could be related to infection. Follow trend.  Likely related to infection.  Stable.  Follow CBC in a.m.  Mild elevation of troponin; flat  He denies chest pain.  EKG no significant changes.  Suspect stress related to acute illness.   Chronic combine systolic Diastolic HF;  Appears compensated.  Poor oral intake. Hold spironolactone.   Paroxysmal A fib;  Cardiology input from 6/11 appreciated.  Dr. Harrington Challenger reviewed findings with Drs. Lovena Le and Slana.  He had 3 minutes of A. fib on last pacer interrogation.  Due to concerns for bleeding, possible hematoma in the back, cardiology recommended holding Eliquis for now and consider resuming in next few weeks for stroke prophylaxis from A. fib.  Urine retention; he has been able to urinate.  Resolved.  Constipation; Miralax, Senakot , and PRN dulcolax suppository.  Having BMs.  Management as above.  Chronic Hypoxic Respiratory failure. On Oxygen at home   Recently   diagnosed with amiodarone toxicity. On Prednisone.  Respiratory status stable.     DVT prophylaxis: scd Code Status: full code.  Family Communication: Discussed in detail with patient's daughter and spouse at bedside.  Updated care and answered questions. Disposition Plan: DC to SNF pending clinical improvement.  Consultants:   Oncology   Neurosurgery   Cardiology  Infectious disease   Procedures:  TEE   Antimicrobials: As above  Subjective: Patient states that he is doing much better compared to admission.  Back pain has significantly improved with decreased frequency and severity of back spasms.  Tolerating full liquid diet and wishes to advance diet.  Had multiple BMs after Dulcolax suppository and wishes to change MiraLAX to as needed.  Denies any other complaints.   Objective: Vitals:   11/10/17 2053 11/11/17 0455 11/11/17 0913 11/11/17 0941  BP:  (!) 153/75  (!) 167/72  Pulse:  62  66  Resp:  17  20  Temp:  98.1 F (36.7 C)  98.9 F (37.2 C)  TempSrc:      SpO2: 99% 97% 96% 100%  Weight:      Height:        Intake/Output Summary (Last 24 hours) at 11/11/2017 1825 Last data filed at 11/11/2017 1504 Gross per 24 hour  Intake 800 ml  Output 900 ml  Net -100 ml   Filed Weights   11/07/17 0757 11/10/17 1307 11/10/17 2028  Weight: 63.5 kg (140 lb) 63.5 kg (140 lb) 63.5 kg (140 lb 0.1 oz)    Examination:  General exam: Pleasant elderly male, moderately built and frail lying comfortably supine in bed. Respiratory system: Clear to auscultation.  No increased work of breathing. Cardiovascular system: S1 and S2 heard, RRR.  No JVD, murmurs or pedal edema.  Telemetry personally reviewed and shows on demand atrial pacing. Gastrointestinal system: Abdomen is nondistended, soft and nontender.  Normal bowel sounds heard. Central nervous system: Alert and oriented x3.  No focal neurological deficits. Extremities: Symmetric power.   Skin: few ecchymosis.    Psychiatry: Mood and affect appropriate     Data Reviewed: I have personally reviewed following labs and imaging studies  CBC: Recent Labs  Lab 11/07/17 0849 11/08/17 0231 11/08/17 1310 11/09/17 0613 11/10/17 0628  WBC 11.3* 11.1* 11.5* 17.7* 17.3*  NEUTROABS 9.3*  --  9.9*  --  15.4*  HGB 12.3* 11.7* 12.1* 12.6* 12.1*  HCT 39.5 37.0* 38.4* 39.8 37.9*  MCV 82.3 81.7 81.2 82.2 81.0  PLT 60* 50* 51* 54* 71*   Basic Metabolic Panel: Recent Labs  Lab 11/07/17 0849 11/08/17 0231 11/09/17 0613 11/10/17 0628  NA 141 139 138 140  K 4.0 4.1 4.4 4.2  CL 107 103 103 104  CO2 25 27 26 24   GLUCOSE 97 98 149* 110*  BUN 20 17 24* 17  CREATININE 1.18 1.03 1.13 0.92  CALCIUM 8.7* 8.7* 8.9 8.8*   GFR: Estimated Creatinine Clearance: 51.1 mL/min (by C-G formula based on SCr of 0.92 mg/dL). Liver Function Tests: Recent Labs  Lab 11/07/17 0849 11/09/17 0613  AST 48* 41  ALT 49 41  ALKPHOS 90 87  BILITOT 0.9 0.9  PROT 6.0* 5.9*  ALBUMIN 3.0* 2.8*   Coagulation Profile: Recent Labs  Lab 11/07/17 0849 11/08/17 0231 11/10/17 0628  INR 1.33 1.30 1.16   Cardiac Enzymes: Recent Labs  Lab 11/07/17 0849 11/07/17 1227 11/07/17 1834 11/08/17 0231  TROPONINI 0.20* 0.09* 0.08* 0.08*   BNP (last 3 results) Recent Labs    10/20/17 1058  PROBNP 146.0*   CBG: Recent Labs  Lab 11/11/17 1216  GLUCAP 135*     Recent Results (from the past 240 hour(s))  Culture, blood (routine x 2)     Status: Abnormal   Collection Time: 11/08/17  9:20 AM  Result Value Ref Range Status   Specimen Description BLOOD RIGHT ANTECUBITAL  Final   Special Requests   Final    BOTTLES DRAWN AEROBIC AND ANAEROBIC Blood Culture adequate volume   Culture  Setup Time   Final    GRAM POSITIVE COCCI IN PAIRS IN BOTH AEROBIC AND ANAEROBIC BOTTLES CRITICAL VALUE NOTED.  VALUE IS CONSISTENT WITH PREVIOUSLY REPORTED AND CALLED VALUE. Performed at [facility], [place] [address], [place],  [place] [address]    Culture (A)  Final    ENTEROCOCCUS FAECALIS SUSCEPTIBILITIES PERFORMED ON PREVIOUS CULTURE WITHIN THE LAST 5 DAYS.    Report Status 11/11/2017 FINAL  Final  Culture, blood (routine x 2)     Status: Abnormal   Collection Time: 11/08/17  9:27 AM  Result Value Ref Range Status   Specimen Description BLOOD RIGHT HAND  Final   Special Requests   Final    BOTTLES DRAWN AEROBIC ONLY Blood Culture adequate volume   Culture  Setup Time   Final    GRAM POSITIVE COCCOBACILLUS AEROBIC BOTTLE ONLY CRITICAL RESULT CALLED TO, READ BACK BY AND VERIFIED WITH: PHARMD E MARTIN 11/09/17 AT 838 BY CM Performed at [facility], [place] [address] [place], [place]  [address]    Culture ENTEROCOCCUS FAECALIS (A)  Final   Report Status 11/11/2017 FINAL  Final   Organism ID, Bacteria ENTEROCOCCUS FAECALIS  Final      Susceptibility   Enterococcus faecalis - MIC*    AMPICILLIN <=2 SENSITIVE Sensitive     VANCOMYCIN 2 SENSITIVE Sensitive     GENTAMICIN SYNERGY SENSITIVE Sensitive     * ENTEROCOCCUS FAECALIS  Blood Culture ID Panel (Reflexed)     Status: Abnormal   Collection Time: 11/08/17  9:27 AM  Result Value Ref Range Status   Enterococcus species DETECTED (A) NOT DETECTED Final    Comment: RBV PHARMD E MARTIN 11/09/17 AT 838 BY CM    Vancomycin resistance NOT DETECTED NOT DETECTED Final   Listeria monocytogenes NOT DETECTED NOT DETECTED Final   Staphylococcus species NOT DETECTED NOT DETECTED Final   Staphylococcus aureus NOT DETECTED NOT DETECTED Final   Streptococcus species NOT DETECTED NOT DETECTED Final   Streptococcus agalactiae NOT DETECTED NOT DETECTED Final   Streptococcus pneumoniae NOT DETECTED NOT DETECTED Final   Streptococcus pyogenes NOT DETECTED NOT DETECTED Final   Acinetobacter baumannii NOT DETECTED NOT DETECTED Final   Enterobacteriaceae species NOT DETECTED NOT DETECTED Final   Enterobacter cloacae complex NOT DETECTED NOT DETECTED Final   Escherichia coli NOT  DETECTED NOT DETECTED Final   Klebsiella oxytoca NOT DETECTED NOT DETECTED Final   Klebsiella pneumoniae NOT DETECTED NOT DETECTED Final   Proteus species NOT DETECTED NOT DETECTED Final   Serratia marcescens NOT DETECTED NOT DETECTED Final   Haemophilus influenzae NOT DETECTED NOT DETECTED Final   Neisseria meningitidis NOT DETECTED NOT DETECTED Final   Pseudomonas aeruginosa NOT DETECTED NOT DETECTED Final   Candida albicans NOT DETECTED NOT DETECTED Final   Candida glabrata NOT DETECTED NOT DETECTED Final   Candida krusei NOT DETECTED NOT DETECTED Final   Candida parapsilosis NOT DETECTED NOT DETECTED Final   Candida tropicalis NOT DETECTED NOT DETECTED Final    Comment: Performed at [facility], [place] [address], [place], [place] [address]  Urine Culture     Status: Abnormal   Collection Time: 11/08/17  4:33 PM  Result Value Ref Range Status   Specimen Description URINE, RANDOM  Final   Special Requests NONE  Final   Culture (A)  Final    <10,000 COLONIES/mL INSIGNIFICANT GROWTH Performed at [facility], [place] [address] [place], [place] [address]    Report Status 11/09/2017 FINAL  Final  Culture, blood (Routine X 2) w Reflex to ID Panel     Status: None (Preliminary result)   Collection Time: 11/10/17  6:28 AM  Result Value Ref Range Status   Specimen Description BLOOD LEFT ARM  Final   Special Requests   Final    BOTTLES DRAWN AEROBIC ONLY Blood Culture results may not be optimal due to an inadequate volume of blood received in culture bottles   Culture   Final    NO GROWTH 1 DAY Performed at [facility], [place] [address], [place], [place] [address]    Report Status PENDING  Incomplete  Culture, blood (Routine X 2) w Reflex to ID Panel     Status: None (Preliminary result)   Collection Time: 11/10/17  6:36 AM  Result Value Ref Range Status   Specimen Description BLOOD LEFT HAND  Final   Special Requests   Final    BOTTLES DRAWN AEROBIC ONLY Blood Culture  results may not be optimal due to  an inadequate volume of blood received in culture bottles   Culture   Final    NO GROWTH 1 DAY Performed at [facility], [place] [address] [place], [place] [address]    Report Status PENDING  Incomplete         Radiology Studies: No results found.      Scheduled Meds: . atorvastatin  20 mg Oral Daily  . budesonide  0.5 mg Nebulization Daily  . famotidine  20 mg Oral Daily  . feeding supplement (ENSURE ENLIVE)  237 mL Oral BID BM  . latanoprost  1 drop Both Eyes QHS  . levothyroxine  12.5 mcg Oral QAC breakfast  . lidocaine  1 patch Transdermal Q24H  . metoprolol succinate  25 mg Oral Daily  . metoprolol tartrate  5 mg Intravenous Once  . mometasone-formoterol  2 puff Inhalation BID  . predniSONE  40 mg Oral Q breakfast  . vitamin B-12  500 mcg Oral Daily   Continuous Infusions: . ampicillin (OMNIPEN) IV Stopped (11/11/17 1638)  . cefTRIAXone (ROCEPHIN)  IV Stopped (11/11/17 1818)     LOS: 2 days    Time spent: 35 minutes.    Vernell Leep, MD, FACP, [facility]. [facility] Pager [phone]  If 7PM-7AM, please contact night-coverage [email or web address] Password [email or web address] 11/11/2017, 6:42 PM

## 2017-11-11 NOTE — NC FL2 (Signed)
[place] LEVEL OF CARE SCREENING TOOL     IDENTIFICATION  Patient Name: Ryan [name] Birthdate: [birth year] Sex: male Admission Date (Current Location): 11/07/2017  [facility] and [place] Number:  Herbalist and Address:  The . [facility], [place] [address] [place], [place] [address]      Provider Number: [id number]  Attending Physician Name and Address:  Modena Jansky, MD  Relative Name and Phone Number:  Terelle Dobler - wife - [phone] (mobile), [phone] (h); Daughter Molinda Bailiff - [phone]    Current Level of Care: Hospital Recommended Level of Care: [place] Prior Approval Number:    Date Approved/Denied:   PASRR Number: [id number](Eff. 11/11/17)  Discharge Plan: SNF    Current Diagnoses: Patient Active Problem List   Diagnosis Date Noted  . Prosthetic valve endocarditis ([place]) 11/10/2017  . Subacute bacterial endocarditis   . Enterococcal bacteremia 11/09/2017  . Intractable back pain 11/07/2017  . Elevated troponin 11/07/2017  . Urinary retention 11/07/2017  . Thrombocytopenia ([place]) 11/07/2017  . Hypertension   . Syncope 10/23/2017  . Decreased diffusion capacity 10/23/2017  . 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 Elevated TSH 06/30/2017  . Anticoagulation management encounter 08/19/2016  . CAD (coronary artery disease), native coronary artery 04/30/2016  . Tachy-brady syndrome ([place])   . Hypertensive heart disease with heart failure ([place])   . Pure hypercholesterolemia   . PAF (paroxysmal atrial fibrillation) ([place]) 03/18/2016  . Chronic combined systolic (congestive) and diastolic (congestive) heart failure ([place]) 03/18/2016  . Atrial fibrillation with RVR ([place]) 03/18/2016  . Atrial fibrillation with rapid ventricular response ([place]) 03/18/2016  . Fatigue 01/17/2016  . DOE (dyspnea on exertion) 01/17/2016  . Coronary artery disease of native artery of native heart with stable  angina pectoris ([place])   . Severe aortic stenosis 10/12/2015    Orientation RESPIRATION BLADDER Height & Weight     Self, Time, Situation, Place  O2(2 Liters oxygen) Continent(Catheter placed 6/9 and removed 6/12) Weight: 140 lb 0.1 oz (63.5 kg) Height:  5\' 5"  (165.1 cm)  BEHAVIORAL SYMPTOMS/MOOD NEUROLOGICAL BOWEL NUTRITION STATUS      Continent Diet(Heart healthy)  AMBULATORY STATUS COMMUNICATION OF NEEDS Skin   Limited Assist Verbally Normal                       Personal Care Assistance Level of Assistance  Feeding, Dressing, Bathing Bathing Assistance: Maximum assistance(OT eval pending) Feeding assistance: Independent Dressing Assistance: Maximum assistance(OT eval pending)     Functional Limitations Info  Sight, Hearing, Speech Sight Info: Impaired(Wears glasses) Hearing Info: Impaired(Deaf in right ear) Speech Info: Adequate    SPECIAL CARE FACTORS FREQUENCY  PT (By licensed PT), OT (By licensed OT)     PT Frequency: Evaluated 6/10              Contractures Contractures Info: Not present    Additional Factors Info  Code Status, Allergies Code Status Info: Full Allergies Info: Ciprofloxacin, Hydrochlorothiazide, Sulfa Antibiotics, Losartan, Carvedilol, Lisinopril           Current Medications (11/11/2017):  This is the current hospital active medication list Current Facility-Administered Medications  Medication Dose Route Frequency Provider Last Rate Last Dose  . acetaminophen (TYLENOL) tablet 500 mg  500 mg Oral PRN Nahser, Wonda Cheng, MD      . albuterol (PROVENTIL) (2.5 MG/3ML) 0.083% nebulizer solution 2.5 mg  2.5 mg Nebulization Q8H PRN Nahser, Wonda Cheng, MD      .  ampicillin (OMNIPEN) 2 g in sodium chloride 0.9 % 100 mL IVPB  2 g Intravenous Q6H Nahser, Wonda Cheng, MD 300 mL/hr at 11/11/17 1053 2 g at 11/11/17 1053  . atorvastatin (LIPITOR) tablet 20 mg  20 mg Oral Daily Nahser, Wonda Cheng, MD   20 mg at 11/11/17 1053  . bisacodyl (DULCOLAX)  suppository 10 mg  10 mg Rectal Daily PRN Nahser, Wonda Cheng, MD      . budesonide (PULMICORT) nebulizer solution 0.5 mg  0.5 mg Nebulization Daily Nahser, Wonda Cheng, MD   0.5 mg at 11/11/17 0912  . cefTRIAXone (ROCEPHIN) 2 g in sodium chloride 0.9 % 100 mL IVPB  2 g Intravenous Q12H [place] Callas, NP 200 mL/hr at 11/11/17 0531 2 g at 11/11/17 0531  . cyclobenzaprine (FLEXERIL) tablet 5 mg  5 mg Oral TID PRN Nahser, Wonda Cheng, MD   5 mg at 11/10/17 1617  . famotidine (PEPCID) tablet 20 mg  20 mg Oral Daily Nahser, Wonda Cheng, MD   20 mg at 11/11/17 1053  . feeding supplement (ENSURE ENLIVE) (ENSURE ENLIVE) liquid 237 mL  237 mL Oral BID BM Nahser, Wonda Cheng, MD   237 mL at 11/11/17 1053  . HYDROmorphone (DILAUDID) injection 0.5 mg  0.5 mg Intravenous Q3H PRN Nahser, Wonda Cheng, MD   0.5 mg at 11/09/17 1058  . latanoprost (XALATAN) 0.005 % ophthalmic solution 1 drop  1 drop Both Eyes QHS Nahser, Wonda Cheng, MD   1 drop at 11/10/17 2157  . levothyroxine (SYNTHROID, LEVOTHROID) tablet 12.5 mcg  12.5 mcg Oral QAC breakfast Nahser, Wonda Cheng, MD   12.5 mcg at 11/11/17 0827  . lidocaine (LIDODERM) 5 % 1 patch  1 patch Transdermal Q24H Nahser, Wonda Cheng, MD   1 patch at 11/11/17 1053  . lip balm (CARMEX) ointment   Topical PRN Nahser, Wonda Cheng, MD      . menthol-cetylpyridinium (CEPACOL) lozenge 3 mg  1 lozenge Oral PRN Nahser, Wonda Cheng, MD      . metoprolol succinate (TOPROL-XL) 24 hr tablet 25 mg  25 mg Oral Daily Nahser, Wonda Cheng, MD   25 mg at 11/11/17 1053  . metoprolol tartrate (LOPRESSOR) injection 5 mg  5 mg Intravenous Once Nahser, Wonda Cheng, MD      . mometasone-formoterol [facility]) 100-5 MCG/ACT inhaler 2 puff  2 puff Inhalation BID Nahser, Wonda Cheng, MD   2 puff at 11/11/17 0911  . nitroGLYCERIN (NITROSTAT) SL tablet 0.4 mg  0.4 mg Sublingual Q5 min PRN Nahser, Wonda Cheng, MD      . ondansetron [facility]) tablet 4 mg  4 mg Oral Q6H PRN Nahser, Wonda Cheng, MD   4 mg at 11/09/17 1819   Or  . ondansetron (ZOFRAN)  injection 4 mg  4 mg Intravenous Q6H PRN Nahser, Wonda Cheng, MD      . oxyCODONE-acetaminophen (PERCOCET/ROXICET) 5-325 MG per tablet 1 tablet  1 tablet Oral Q6H PRN Nahser, Wonda Cheng, MD   1 tablet at 11/10/17 2157  . polyethylene glycol (MIRALAX / GLYCOLAX) packet 17 g  17 g Oral Daily PRN Hongalgi, Anand D, MD      . predniSONE (DELTASONE) tablet 40 mg  40 mg Oral Q breakfast Nahser, Wonda Cheng, MD   40 mg at 11/11/17 0827  . traZODone (DESYREL) tablet 25 mg  25 mg Oral QHS PRN Nahser, Wonda Cheng, MD      . vitamin B-12 (CYANOCOBALAMIN) tablet 500 mcg  500 mcg Oral Daily Nahser,  Wonda Cheng, MD   500 mcg at 11/11/17 1053     Discharge Medications: Please see discharge summary for a list of discharge medications.  Relevant Imaging Results:  Relevant Lab Results:   Additional Information ss#[id number].  Sable Feil, LCSW

## 2017-11-11 NOTE — Plan of Care (Signed)
  Problem: Clinical Measurements: Goal: Will remain free from infection Outcome: Progressing Note:  Antibiotics given as ordered.   

## 2017-11-11 NOTE — Progress Notes (Signed)
Pt seen.  Looks like he is feeling better  No new recommendations from those noted yesterday  IM and ID following  Will continue to follow.

## 2017-11-11 NOTE — Clinical Social Work Note (Signed)
Clinical Social Work Assessment  Patient Details  Name: Ryan [name] MRN: [id number] Date of Birth: Mar 30, 1930  Date of referral:  11/11/17               Reason for consult:  Facility Placement, Discharge Planning                Permission sought to share information with:  Family Supports Permission granted to share information::  No(Wife and daughter in room and patient allowed them to particpate freely in the d/c planning conversation.)  Name::     Ryan [name] and Ryan [name]  Agency::     Relationship::  Wife and daughter  Sport and exercise psychologist Information:  Ryan [name] [phone] (mobile) and Ryan [name] - [phone]  Housing/Transportation Living arrangements for the past 2 months:  Single Family Home(Per daughter, her parent's home is behind her home) Source of Information:  Patient, Spouse, Adult Children Patient Interpreter Needed:  None Criminal Activity/Legal Involvement Pertinent to Current Situation/Hospitalization:  No - Comment as needed Significant Relationships:  Spouse, Adult Children Lives with:    Do you feel safe going back to the place where you live?  No(Patient and family in agreement with ST rehab before returning home) Need for family participation in patient care:  Yes (Comment)  Care giving concerns:  Patient and wife live together. Everyone is in  agreement that short-term rehab will benefit patient before returning home.  Social Worker assessment / plan:  CSW talked with Ryan [name], his wife and daughter at the bedside regarding his discharge disposition  and recommendation of ST rehab. Ryan [name] was lying in bed and was awake, alert, oriented and open to talking with CSW, as was his wife and daughter. CSW introduced self and explained the facility search process, provided SNF list and also talked with them regarding some of the things patient will be participating in while at rehab. CSW informed that their facility preference is [name] and CSW also  requested a second choice.   CSW advised by daughter that she has 2 other siblings who live "far away". She added that her sister lives almost in [place].    Employment status:  Retired Brewing technologist) PT Recommendations:  [place] / Referral to community resources:  [facility](SNF list for [facility] provided to family)  Patient/Family's Response to care:  No concerns expressed by patient or family   Patient/Family's Understanding of and Emotional Response to Diagnosis, Current Treatment, and Prognosis:  Family appeared  knowledgeable regarding patient's need for continued care and rehab before returning home. A doctor came in during CSW's visit and daughter family engaged well with the doctor and asked questions.   Emotional Assessment Appearance:  Appears stated age Attitude/Demeanor/Rapport:  Other(Appropriate) Affect (typically observed):  Appropriate, Pleasant Orientation:  Oriented to Self, Oriented to Situation, Oriented to Place, Oriented to  Time Alcohol / Substance use:  Tobacco Use, Alcohol Use, Illicit Drugs(Patient reported that he has never smoked, does not drink or use illicit drugs) Psych involvement (Current and /or in the community):  No (Comment)  Discharge Needs  Concerns to be addressed:  Discharge Planning Concerns Readmission within the last 30 days:  Yes Current discharge risk:  None Barriers to Discharge:  Continued Medical Work up, [place], Aslin Farinas Bradley, LCSW 11/11/2017, 11:28 AM

## 2017-11-12 ENCOUNTER — Other Ambulatory Visit: Payer: Self-pay

## 2017-11-12 ENCOUNTER — Inpatient Hospital Stay: Payer: Self-pay

## 2017-11-12 DIAGNOSIS — I5022 Chronic systolic (congestive) heart failure: Secondary | ICD-10-CM

## 2017-11-12 LAB — CBC
HCT: 39.9 % (ref 39.0–52.0)
Hemoglobin: 12.6 g/dL — ABNORMAL LOW (ref 13.0–17.0)
MCH: 25.9 pg — AB (ref 26.0–34.0)
MCHC: 31.6 g/dL (ref 30.0–36.0)
MCV: 81.9 fL (ref 78.0–100.0)
Platelets: 107 10*3/uL — ABNORMAL LOW (ref 150–400)
RBC: 4.87 MIL/uL (ref 4.22–5.81)
RDW: 19.9 % — ABNORMAL HIGH (ref 11.5–15.5)
WBC: 16.3 10*3/uL — ABNORMAL HIGH (ref 4.0–10.5)

## 2017-11-12 LAB — GLUCOSE, CAPILLARY: GLUCOSE-CAPILLARY: 105 mg/dL — AB (ref 65–99)

## 2017-11-12 MED ORDER — POLYETHYLENE GLYCOL 3350 17 G PO PACK
17.0000 g | PACK | Freq: Every day | ORAL | Status: DC
  Administered 2017-11-12 – 2017-11-13 (×2): 17 g via ORAL
  Filled 2017-11-12 (×2): qty 1

## 2017-11-12 MED ORDER — METOPROLOL TARTRATE 50 MG PO TABS
50.0000 mg | ORAL_TABLET | Freq: Two times a day (BID) | ORAL | Status: DC
  Administered 2017-11-12 – 2017-11-14 (×4): 50 mg via ORAL
  Filled 2017-11-12 (×4): qty 1

## 2017-11-12 NOTE — Progress Notes (Signed)
PHARMACY CONSULT NOTE FOR:  OUTPATIENT  PARENTERAL ANTIBIOTIC THERAPY (OPAT)  Indication:Enterococcal Endocarditis of prosthetic valve   Regimen: Ceftriaxone 2gm IV q12h, Ampicillin 2gm Iv q6h End date: 12/22/17  IV antibiotic discharge orders are pended. To discharging provider:  please sign these orders via discharge navigator,  Select New Orders & click on the button choice - Manage This Unsigned Work.       Glee Lashomb A. Levada Dy, PharmD, [place] Pager: [phone]  11/12/2017, 12:54 PM

## 2017-11-12 NOTE — Progress Notes (Signed)
PROGRESS NOTE    Ryan [name]  WNU:[id number] DOB: 06/02/1930 DOA: 11/07/2017 PCP: Dorothyann Peng, NP    Brief Narrative:  Ryan [name] is a very pleasant 84 y.o. male with medical history significant for aortic valve stenosis status post TAVR, cardiomyopathy, A. Fib, CHF, chronic low back pain,carotid artery disease status post bilateral CEA, CAD status postPTCA 2017, hypertension presented to ED due to persistent back pain.  Patient was recently admitted for suspected amiodarone induced lung toxicity at which time he was started on prednisone and spironolactone.  He was doing better until 3 days PTA when he developed worsening left low back pain.  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 Regarding diagnosis of Back pain, differential diagnosis of spinal stenosis, vs discitis, vs spinal epidural hematoma. Neurosurgery recommend oral prednisone for 7 days, continue with IV antibiotics.  ID, cardiology and neurosurgery consulted.   Assessment & Plan:   Principal Problem:   Prosthetic valve endocarditis (HCC) Active Problems:   Severe aortic stenosis   PAF (paroxysmal atrial fibrillation) (HCC)   Chronic combined systolic (congestive) and diastolic (congestive) heart failure (HCC)   CAD (coronary artery disease), native coronary artery   Amiodarone pulmonary toxicity   Intractable back pain   Elevated troponin   Urinary retention   Thrombocytopenia (HCC)   Enterococcal bacteremia   Subacute bacterial endocarditis   Intractable Back pain; could be related to exacerbation of spondylosis, Vs Discitis, unable to rule out epidural hematoma.  -CT showed spondylosis, mild to moderate multilevel foraminal stenosis, pars defect L 4-5.  -He was started on baclofen, IV morphine, and vicodin, didn't help. Now on low dose dilaudid and flexeril.  -Needs prednisone for back pain for 1 week, then further dose of prednisone for amiodarone toxicity.  -Blood culture positive for  enterococcus.  -Neurosurgery has been consulted, they recommend medical management and no need to do CT myelogram.  -CT abdomen negative for retroperitoneal Bleed.  -He was on prednisone for recent prednisone toxicity, he was supposed to be on taper dose. Currently getting 40 mg daily. Will need to taper depending on recommendation by Neurosx, and cardiologist for amiodarone toxicity. -Discussed with Dr Kathyrn Sheriff, unable to rule out discitis with CT spine. Ancillary and secondary test like TAG RBC scan, PET scan, wont change management regarding treatment for discitis. Regarding spinal bleed, patient would have symptoms of cord compression if he had  large hematoma. We are not able to rule out small hematoma in the spine.  -Dr Kathyrn Sheriff, neurosurgery reassessed on 6/11 and indicates that his acute onset of back pain is due to 1 of 3 possibilities: Exacerbation of chronic lumbar spondylosis, spontaneous spinal EDH related to Eliquis or discitis.  He recommends nonoperative treatment, continue antibiotics for his bacteremia and if stroke risk felt to be high without Eliquis then consider resuming it.  However cardiology has seen and recommend holding Eliquis at this time. -ESR and CRP elevated.  - ID also recommending repeating CT spine in 2 weeks, to determine length of treatment.  -He might need IV antibiotics treatment for Discitis.  -Please see below for antibiotic recommendations by ID.  Back pain continues to improve and reportedly significantly better.  Patient ambulated with PT this morning.  Enterococcal bacteremia with prosthetic aortic valve (TAVR) endocarditis  blood culture grew enteroccocus. Second set gram positive cocci in pair.  As per ID, on ampicillin and ceftriaxone for dual beta-lactam therapy.  Recommend 6 weeks through December 21, 2017 and follow-up TEE by cardiology prior to stopping  treatment in about 5 to 6 weeks.  If TEE okay at that time then stop antibiotics.  Surveillance blood  cultures to be drawn 3 to 5 days after antibiotic completion.  PICC line 6/14 if surveillance blood cultures from 6/11 remain negative.  Will need lab monitoring per OPAT protocol at discharge.  ID continues to follow, have ordered PICC line for 6/14 and will arrange outpatient follow-up with them. Surveillance blood cultures x2 from 11/10/2017: Negative after 2 days.  Relative transition point in the sigmoid no evidence of obstruction.  -CT abdomen pelvis.  negative for retroperitoneal bleed. Stool through the colon, relative transition point, no obstruction. Discussed with radiologist transition point could be physiologic, vs adhesions. . Discussed with family he has prior history of adhesions, prior sx. I also spoke with Dr. Alvy Bimler, she wonder about ischemic bowel. lactic acid  negative. Oncology recommend GI consultation.  -Evaluated by GI, hold on colonoscopy, evaluate for endocarditis and Discitis.  -Patient had multiple BMs after Dulcolax suppository overnight.  Tolerating diet.  No BM since night before last.  After discussing with patient and family, change MiraLAX to daily.  Thrombocytopenia;  Dr Lottie Rater consulted/.  Now that blood cultures are positive, thrombocytopenia could be related to infection. Follow trend.  Likely related to infection.  Improving.  Continue to follow CBCs.  Mild elevation of troponin; flat  He denies chest pain.  EKG no significant changes.  Suspect stress related to acute illness.   Chronic combine systolic Diastolic HF;  Appears compensated.  Poor oral intake. Hold spironolactone.   Paroxysmal A fib;  Cardiology input from 6/11 appreciated.  Dr. Harrington Challenger reviewed findings with Drs. Lovena Le and [place].  He had 3 minutes of A. fib on last pacer interrogation.  Due to concerns for bleeding, possible hematoma in the back, cardiology recommended holding Eliquis for now and consider resuming in next few weeks for stroke prophylaxis from A. Fib. Noted A. fib with  RVR intermittently up to 140s since 5 AM today.  Continue Toprol-XL.  Cardiology team alerted.  Urine retention; he has been able to urinate.  Resolved.  Constipation; Miralax, Senakot , and PRN dulcolax suppository.  Having BMs.  Management as above.  Chronic Hypoxic Respiratory failure. On Oxygen at home.  As per patient and family, did not require oxygen when ambulated with PT today and was saturating normally.  Recently  diagnosed with amiodarone toxicity. On Prednisone.  Respiratory status stable.     DVT prophylaxis: scd Code Status: full code.  Family Communication: Discussed in detail with patient's daughter and spouse at bedside.  Updated care and answered questions.  Also discussed with patient's son-in-law who is a Retail buyer. Disposition Plan: DC to SNF pending clinical improvement.  Consultants:   Oncology   Neurosurgery   Cardiology  Infectious disease   Procedures:  TEE   Antimicrobials: As above  Subjective: Had just finished ambulating with PT.  Back pain controlled.  No BM since light before last.  Tolerating diet.  No other complaints reported.  Daughter indicates that patient has a bed at [facility] for tomorrow.   Objective: Vitals:   11/12/17 0448 11/12/17 0900 11/12/17 0945 11/12/17 1615  BP: (!) 144/74  92/74 (!) 156/68  Pulse: 78  75 66  Resp: 16  16 16   Temp: 98.1 F (36.7 C)  (!) 97.5 F (36.4 C) 97.6 F (36.4 C)  TempSrc:   Oral Oral  SpO2: 100% 94% 98% 97%  Weight:  Height:        Intake/Output Summary (Last 24 hours) at 11/12/2017 1631 Last data filed at 11/12/2017 1430 Gross per 24 hour  Intake 3063.33 ml  Output 700 ml  Net 2363.33 ml   Filed Weights   11/07/17 0757 11/10/17 1307 11/10/17 2028  Weight: 63.5 kg (140 lb) 63.5 kg (140 lb) 63.5 kg (140 lb 0.1 oz)    Examination:  General exam: Pleasant elderly male, moderately built and frail lying comfortably supine in bed. Respiratory system:  Clear to auscultation.  No increased work of breathing.  Stable Cardiovascular system: S1 and S2 heard, RRR.  No JVD, murmurs or pedal edema.  Telemetry personally reviewed and showed A. fib with RVR intermittently up to 140 since 5 AM. Gastrointestinal system: Abdomen is nondistended, soft and nontender.  Normal bowel sounds heard.  Stable Central nervous system: Alert and oriented x3.  No focal neurological deficits.  Stable Extremities: Symmetric power.   Skin: few ecchymosis.  Psychiatry: Mood and affect appropriate     Data Reviewed: I have personally reviewed following labs and imaging studies  CBC: Recent Labs  Lab 11/07/17 0849 11/08/17 0231 11/08/17 1310 11/09/17 0613 11/10/17 0628 11/12/17 0721  WBC 11.3* 11.1* 11.5* 17.7* 17.3* 16.3*  NEUTROABS 9.3*  --  9.9*  --  15.4*  --   HGB 12.3* 11.7* 12.1* 12.6* 12.1* 12.6*  HCT 39.5 37.0* 38.4* 39.8 37.9* 39.9  MCV 82.3 81.7 81.2 82.2 81.0 81.9  PLT 60* 50* 51* 54* 71* 413*   Basic Metabolic Panel: Recent Labs  Lab 11/07/17 0849 11/08/17 0231 11/09/17 0613 11/10/17 0628  NA 141 139 138 140  K 4.0 4.1 4.4 4.2  CL 107 103 103 104  CO2 25 27 26 24   GLUCOSE 97 98 149* 110*  BUN 20 17 24* 17  CREATININE 1.18 1.03 1.13 0.92  CALCIUM 8.7* 8.7* 8.9 8.8*   GFR: Estimated Creatinine Clearance: 51.1 mL/min (by C-G formula based on SCr of 0.92 mg/dL). Liver Function Tests: Recent Labs  Lab 11/07/17 0849 11/09/17 0613  AST 48* 41  ALT 49 41  ALKPHOS 90 87  BILITOT 0.9 0.9  PROT 6.0* 5.9*  ALBUMIN 3.0* 2.8*   Coagulation Profile: Recent Labs  Lab 11/07/17 0849 11/08/17 0231 11/10/17 0628  INR 1.33 1.30 1.16   Cardiac Enzymes: Recent Labs  Lab 11/07/17 0849 11/07/17 1227 11/07/17 1834 11/08/17 0231  TROPONINI 0.20* 0.09* 0.08* 0.08*   BNP (last 3 results) Recent Labs    10/20/17 1058  PROBNP 146.0*   CBG: Recent Labs  Lab 11/11/17 1216 11/12/17 0726  GLUCAP 135* 105*     Recent Results  (from the past 240 hour(s))  Culture, blood (routine x 2)     Status: Abnormal   Collection Time: 11/08/17  9:20 AM  Result Value Ref Range Status   Specimen Description BLOOD RIGHT ANTECUBITAL  Final   Special Requests   Final    BOTTLES DRAWN AEROBIC AND ANAEROBIC Blood Culture adequate volume   Culture  Setup Time   Final    GRAM POSITIVE COCCI IN PAIRS IN BOTH AEROBIC AND ANAEROBIC BOTTLES CRITICAL VALUE NOTED.  VALUE IS CONSISTENT WITH PREVIOUSLY REPORTED AND CALLED VALUE. Performed at [facility], [place] [address], [place], [place] [address]    Culture (A)  Final    ENTEROCOCCUS FAECALIS SUSCEPTIBILITIES PERFORMED ON PREVIOUS CULTURE WITHIN THE LAST 5 DAYS.    Report Status 11/11/2017 FINAL  Final  Culture, blood (routine x  2)     Status: Abnormal   Collection Time: 11/08/17  9:27 AM  Result Value Ref Range Status   Specimen Description BLOOD RIGHT HAND  Final   Special Requests   Final    BOTTLES DRAWN AEROBIC ONLY Blood Culture adequate volume   Culture  Setup Time   Final    GRAM POSITIVE COCCOBACILLUS AEROBIC BOTTLE ONLY CRITICAL RESULT CALLED TO, READ BACK BY AND VERIFIED WITH: PHARMD E MARTIN 11/09/17 AT 838 BY CM Performed at [facility], [place] [address], [place], [place] [address]    Culture ENTEROCOCCUS FAECALIS (A)  Final   Report Status 11/11/2017 FINAL  Final   Organism ID, Bacteria ENTEROCOCCUS FAECALIS  Final      Susceptibility   Enterococcus faecalis - MIC*    AMPICILLIN <=2 SENSITIVE Sensitive     VANCOMYCIN 2 SENSITIVE Sensitive     GENTAMICIN SYNERGY SENSITIVE Sensitive     * ENTEROCOCCUS FAECALIS  Blood Culture ID Panel (Reflexed)     Status: Abnormal   Collection Time: 11/08/17  9:27 AM  Result Value Ref Range Status   Enterococcus species DETECTED (A) NOT DETECTED Final    Comment: RBV PHARMD E MARTIN 11/09/17 AT 838 BY CM    Vancomycin resistance NOT DETECTED NOT DETECTED Final   Listeria monocytogenes NOT DETECTED NOT DETECTED  Final   Staphylococcus species NOT DETECTED NOT DETECTED Final   Staphylococcus aureus NOT DETECTED NOT DETECTED Final   Streptococcus species NOT DETECTED NOT DETECTED Final   Streptococcus agalactiae NOT DETECTED NOT DETECTED Final   Streptococcus pneumoniae NOT DETECTED NOT DETECTED Final   Streptococcus pyogenes NOT DETECTED NOT DETECTED Final   Acinetobacter baumannii NOT DETECTED NOT DETECTED Final   Enterobacteriaceae species NOT DETECTED NOT DETECTED Final   Enterobacter cloacae complex NOT DETECTED NOT DETECTED Final   Escherichia coli NOT DETECTED NOT DETECTED Final   Klebsiella oxytoca NOT DETECTED NOT DETECTED Final   Klebsiella pneumoniae NOT DETECTED NOT DETECTED Final   Proteus species NOT DETECTED NOT DETECTED Final   Serratia marcescens NOT DETECTED NOT DETECTED Final   Haemophilus influenzae NOT DETECTED NOT DETECTED Final   Neisseria meningitidis NOT DETECTED NOT DETECTED Final   Pseudomonas aeruginosa NOT DETECTED NOT DETECTED Final   Candida albicans NOT DETECTED NOT DETECTED Final   Candida glabrata NOT DETECTED NOT DETECTED Final   Candida krusei NOT DETECTED NOT DETECTED Final   Candida parapsilosis NOT DETECTED NOT DETECTED Final   Candida tropicalis NOT DETECTED NOT DETECTED Final    Comment: Performed at [facility], [place]. [address], [place], [place] [address]  Urine Culture     Status: Abnormal   Collection Time: 11/08/17  4:33 PM  Result Value Ref Range Status   Specimen Description URINE, RANDOM  Final   Special Requests NONE  Final   Culture (A)  Final    <10,000 COLONIES/mL INSIGNIFICANT GROWTH Performed at [facility], [place] [address], [place], [place] [address]    Report Status 11/09/2017 FINAL  Final  Culture, blood (Routine X 2) w Reflex to ID Panel     Status: None (Preliminary result)   Collection Time: 11/10/17  6:28 AM  Result Value Ref Range Status   Specimen Description BLOOD LEFT ARM  Final   Special Requests   Final     BOTTLES DRAWN AEROBIC ONLY Blood Culture results may not be optimal due to an inadequate volume of blood received in culture bottles   Culture  Final    NO GROWTH 2 DAYS Performed at [facility], [place] [address] [place], [place] [address]    Report Status PENDING  Incomplete  Culture, blood (Routine X 2) w Reflex to ID Panel     Status: None (Preliminary result)   Collection Time: 11/10/17  6:36 AM  Result Value Ref Range Status   Specimen Description BLOOD LEFT HAND  Final   Special Requests   Final    BOTTLES DRAWN AEROBIC ONLY Blood Culture results may not be optimal due to an inadequate volume of blood received in culture bottles   Culture   Final    NO GROWTH 2 DAYS Performed at [facility], [place] [address], [place], [place] [address]    Report Status PENDING  Incomplete         Radiology Studies: [place] Ekg Site Rite  Result Date: 11/12/2017 If Site Rite image not attached, placement could not be confirmed due to current cardiac rhythm.       Scheduled Meds: . atorvastatin  20 mg Oral Daily  . budesonide  0.5 mg Nebulization Daily  . famotidine  20 mg Oral Daily  . feeding supplement (ENSURE ENLIVE)  237 mL Oral BID BM  . latanoprost  1 drop Both Eyes QHS  . levothyroxine  12.5 mcg Oral QAC breakfast  . lidocaine  1 patch Transdermal Q24H  . metoprolol succinate  25 mg Oral Daily  . mometasone-formoterol  2 puff Inhalation BID  . predniSONE  40 mg Oral Q breakfast  . vitamin B-12  500 mcg Oral Daily   Continuous Infusions: . ampicillin (OMNIPEN) IV 2 g (11/12/17 1554)  . cefTRIAXone (ROCEPHIN)  IV Stopped (11/12/17 0600)     LOS: 3 days    Time spent: 35 minutes.    Vernell Leep, MD, FACP, [facility]. [facility] Pager [phone]  If 7PM-7AM, please contact night-coverage [email or web address] Password [facility] 11/12/2017, 4:31 PM

## 2017-11-12 NOTE — Progress Notes (Signed)
Progress Note  Patient Name: Ryan [name] Date of Encounter: 11/12/2017  Primary Cardiologist: Ena Dawley, MD   Subjective   No CP   Breathing is OK     Inpatient Medications    Scheduled Meds: . atorvastatin  20 mg Oral Daily  . budesonide  0.5 mg Nebulization Daily  . famotidine  20 mg Oral Daily  . feeding supplement (ENSURE ENLIVE)  237 mL Oral BID BM  . latanoprost  1 drop Both Eyes QHS  . levothyroxine  12.5 mcg Oral QAC breakfast  . lidocaine  1 patch Transdermal Q24H  . metoprolol succinate  25 mg Oral Daily  . mometasone-formoterol  2 puff Inhalation BID  . predniSONE  40 mg Oral Q breakfast  . vitamin B-12  500 mcg Oral Daily   Continuous Infusions: . ampicillin (OMNIPEN) IV 2 g (11/12/17 1554)  . cefTRIAXone (ROCEPHIN)  IV Stopped (11/12/17 0600)   PRN Meds: acetaminophen, albuterol, bisacodyl, cyclobenzaprine, HYDROmorphone (DILAUDID) injection, lip balm, menthol-cetylpyridinium, nitroGLYCERIN, ondansetron **OR** ondansetron (ZOFRAN) IV, oxyCODONE-acetaminophen, polyethylene glycol, traZODone   Vital Signs    Vitals:   11/12/17 0448 11/12/17 0900 11/12/17 0945 11/12/17 1615  BP: (!) 144/74  92/74 (!) 156/68  Pulse: 78  75 66  Resp: 16  16 16   Temp: 98.1 F (36.7 C)  (!) 97.5 F (36.4 C) 97.6 F (36.4 C)  TempSrc:   Oral Oral  SpO2: 100% 94% 98% 97%  Weight:      Height:        Intake/Output Summary (Last 24 hours) at 11/12/2017 1642 Last data filed at 11/12/2017 1430 Gross per 24 hour  Intake 2773.33 ml  Output 700 ml  Net 2073.33 ml   Filed Weights   11/07/17 0757 11/10/17 1307 11/10/17 2028  Weight: 63.5 kg (140 lb) 63.5 kg (140 lb) 63.5 kg (140 lb 0.1 oz)    Telemetry    SR - Personally Reviewed  ECG      Physical Exam   GEN:  Thin 84 yo in No acute distress.   Neck: No JVD Cardiac: RRR, I-II/VI  Systolic murmur   No, rubs, or gallops.  Respiratory: Clear to auscultation bilaterally. GI: Soft, nontender,  non-distended  MS: No edema; No deformity. Neuro:  Nonfocal  Psych: Normal affect   Labs    Chemistry Recent Labs  Lab 11/07/17 0849 11/08/17 0231 11/09/17 0613 11/10/17 0628  NA 141 139 138 140  K 4.0 4.1 4.4 4.2  CL 107 103 103 104  CO2 25 27 26 24   GLUCOSE 97 98 149* 110*  BUN 20 17 24* 17  CREATININE 1.18 1.03 1.13 0.92  CALCIUM 8.7* 8.7* 8.9 8.8*  PROT 6.0*  --  5.9*  --   ALBUMIN 3.0*  --  2.8*  --   AST 48*  --  41  --   ALT 49  --  41  --   ALKPHOS 90  --  87  --   BILITOT 0.9  --  0.9  --   GFRNONAA 54* >60 57* >60  GFRAA >60 >60 >60 >60  ANIONGAP 9 9 9 12      Hematology Recent Labs  Lab 11/09/17 0613 11/10/17 0628 11/12/17 0721  WBC 17.7* 17.3* 16.3*  RBC 4.84 4.68 4.87  HGB 12.6* 12.1* 12.6*  HCT 39.8 37.9* 39.9  MCV 82.2 81.0 81.9  MCH 26.0 25.9* 25.9*  MCHC 31.7 31.9 31.6  RDW 19.2* 19.6* 19.9*  PLT 54* 71* 107*  Cardiac Enzymes Recent Labs  Lab 11/07/17 0849 11/07/17 1227 11/07/17 1834 11/08/17 0231  TROPONINI 0.20* 0.09* 0.08* 0.08*   No results for input(s): TROPIPOC in the last 168 hours.   BNP Recent Labs  Lab 11/07/17 0849  BNP 219.3*     DDimer  Recent Labs  Lab 11/08/17 1310  DDIMER 2.65*     Radiology    [place] Ekg Site Rite  Result Date: 11/12/2017 If Site Rite image not attached, placement could not be confirmed due to current cardiac rhythm.   Cardiac Studies   TEE  Study Conclusions  - Left ventricle: Systolic function was mildly reduced. The   estimated ejection fraction was in the range of 45% to 50%. - Aortic valve: A bioprosthesis was present. There was a large, 1.8   cm (L) x 1.8 cm (W), multilobulated, highly mobile vegetation on   the aortic aspect; the appearance is consistent with vegetation.   There was a medium-sized, 1.1 cm (L) x 0.4 cm (W), irregular,   mobile vegetation on the left ventricular aspect; the appearance   is consistent with vegetation. There was trivial regurgitation. -  Mitral valve: There was mild to moderate regurgitation. - Left atrium: No evidence of thrombus in the atrial cavity or   appendage. - Tricuspid valve: No evidence of vegetation.  Impressions:  - 3 D images of the Aortic valve were obtained.   There are 2 complex masses associated with the valve.   There is a large , multilobulated vegetation on the supravalvular   aspect of the valve and a small vegetation on the subvalvular   aspect of the valve.   There is trivial perivalvular aortic insufficiency  Patient Profile     84 y.o. male with history of CAD, PAF, PPM, AV dz, s/p TAVR.  Admitted with back pain   Found to be bacteremic      Assessment & Plan    1.  ID   Pt on ABX for endocarditis.   ID monitoring   Will get PICC line tomorrow  2  Paroxysmal atrial fib. Pt had atrial fib with RVR develop around 5:30 this am   HR 110s to 140   Avg 120   Around 1 PM went back into SR for several beats then back in afib   Tis went back and forth for about 1 hour Pt is back in SR (Paced) now.   ON talking to him he may have noted a faint fluttering     Recomm increasing metorplol   Change to tartrate short acting   50 bid Will need Eliquis   Wait until after PICC line placed    3.  CAD   No symtoms to sugg angina  4  Chronic systolic CHF   Volume status remains pretty good  Currently only on metoprolol     Follow BP as titrate      For questions or updates, please contact [place] Please consult [email or web address] for contact info under Cardiology/STEMI.      Signed, Dorris Carnes, MD  11/12/2017, 4:42 PM

## 2017-11-12 NOTE — Evaluation (Signed)
Occupational Therapy Evaluation Patient Details Name: Ryan [name] MRN: [id number] DOB: [birth year] Today's Date: 11/12/2017    History of Present Illness 84yo male who was admitted for intractible back pain, has grade 2 L5-S1 anterolisthesis and stenosis, L5 pars defect, L3 center of levoconvex curvature, foraminal narrowing mult levels, L5 pars defect.  PMHx:  TAVR, bladder CA, hypoxia,    Clinical Impression   Pt reports he was mod I with ADL PTA. Currently pt requires min guard assist for functional mobility and standing grooming task; would require min-mod assist for LB ADL due to pain. SpO2 96% on RA following activity, HR in 120s. Pt planning d/c to SNF for continued rehab prior to return home; agree with short-term SNF placement. Pt would benefit from continued skilled OT to address established goals.    Follow Up Recommendations  SNF    Equipment Recommendations  Other (comment)(TBD at next venue)    Recommendations for Other Services       Precautions / Restrictions Precautions Precautions: Fall Precaution Comments: watch O2  Restrictions Weight Bearing Restrictions: No      Mobility Bed Mobility Overal bed mobility: Needs Assistance Bed Mobility: Rolling;Sidelying to Sit;Sit to Sidelying Rolling: Supervision Sidelying to sit: Min assist     Sit to sidelying: Min assist General bed mobility comments: Assist for LEs OOB and back to bed. Cues for log roll technqiue. HOB flat with use of bed rails. Light assist to elevate trunk to sitting position  Transfers Overall transfer level: Needs assistance Equipment used: Rolling walker (2 wheeled) Transfers: Sit to/from Stand Sit to Stand: Min guard         General transfer comment: Cues for hand placement but pt pulling up in RW with both hands. Cues for posture and technique    Balance Overall balance assessment: Needs assistance Sitting-balance support: Feet supported Sitting balance-Leahy Scale: Fair     Standing balance support: No upper extremity supported;During functional activity Standing balance-Leahy Scale: Fair Standing balance comment: static standing at sink during grooming task                           ADL either performed or assessed with clinical judgement   ADL Overall ADL's : Needs assistance/impaired Eating/Feeding: Set up   Grooming: Min guard;Standing;Oral care   Upper Body Bathing: Min guard;Sitting   Lower Body Bathing: Minimal assistance;Sit to/from stand   Upper Body Dressing : Min guard;Sitting   Lower Body Dressing: Moderate assistance;Sit to/from stand   Toilet Transfer: Min guard;Ambulation;RW Toilet Transfer Details (indicate cue type and reason): Simulated by sit to stand with functional mobility in room         Functional mobility during ADLs: Min guard;Rolling walker General ADL Comments: SpO2 96% on RA following activity; HR in 120s     Vision         Perception     Praxis      Pertinent Vitals/Pain Pain Assessment: 0-10 Pain Score: 8  Pain Location: back Pain Descriptors / Indicators: Aching;Sharp;Stabbing Pain Intervention(s): Monitored during session     Hand Dominance Right   Extremity/Trunk Assessment Upper Extremity Assessment Upper Extremity Assessment: Overall WFL for tasks assessed   Lower Extremity Assessment Lower Extremity Assessment: Defer to PT evaluation   Cervical / Trunk Assessment Cervical / Trunk Assessment: Other exceptions Cervical / Trunk Exceptions: anterolisthesis, pars defect   Communication Communication Communication: No difficulties   Cognition Arousal/Alertness: Awake/alert Behavior During Therapy: WFL for tasks  assessed/performed Overall Cognitive Status: Within Functional Limits for tasks assessed                                     General Comments       Exercises     Shoulder Instructions      Home Living Family/patient expects to be discharged to::  Skilled nursing facility Living Arrangements: Spouse/significant other Available Help at Discharge: Family;Available 24 hours/day Type of Home: House Home Access: Stairs to enter [employer] of Steps: 3 Entrance Stairs-Rails: Right Home Layout: One level         Bathroom Toilet: Standard     Home Equipment: Environmental consultant - 2 wheels;Shower seat          Prior Functioning/Environment Level of Independence: Independent with assistive device(s)        Comments: Rollator for mobility. Use of shower chair and supplemental O2 recently        OT Problem List: Decreased activity tolerance;Impaired balance (sitting and/or standing);Decreased knowledge of use of DME or AE;Decreased knowledge of precautions;Pain      OT Treatment/Interventions: Self-care/ADL training;Therapeutic exercise;Energy conservation;DME and/or AE instruction;Therapeutic activities;Patient/family education;Balance training    OT Goals(Current goals can be found in the care plan section) Acute Rehab OT Goals Patient Stated Goal: decrease pain OT Goal Formulation: With patient Time For Goal Achievement: 11/26/17 Potential to Achieve Goals: Good ADL Goals Pt Will Perform Lower Body Bathing: with supervision;sit to/from stand Pt Will Perform Lower Body Dressing: with supervision;sit to/from stand Pt Will Transfer to Toilet: with supervision;ambulating;bedside commode Pt Will Perform Toileting - Clothing Manipulation and hygiene: with supervision;sit to/from stand  OT Frequency: Min 2X/week   Barriers to D/C:            Co-evaluation PT/OT/SLP Co-Evaluation/Treatment: Yes Reason for Co-Treatment: For patient/therapist safety   OT goals addressed during session: ADL's and self-care      AM-PAC PT "6 Clicks" Daily Activity     Outcome Measure Help from another person eating meals?: None Help from another person taking care of personal grooming?: A Little Help from another person toileting, which  includes using toliet, bedpan, or urinal?: A Little Help from another person bathing (including washing, rinsing, drying)?: A Little Help from another person to put on and taking off regular upper body clothing?: A Little Help from another person to put on and taking off regular lower body clothing?: A Lot 6 Click Score: 18   End of Session Equipment Utilized During Treatment: Gait belt;Rolling walker  Activity Tolerance: Patient tolerated treatment well Patient left: in bed;with call bell/phone within reach;with family/visitor present  OT Visit Diagnosis: Unsteadiness on feet (R26.81);Pain Pain - part of body: (back)                Time: 3299-2426 OT Time Calculation (min): 21 min Charges:  OT General Charges $OT Visit: 1 Visit OT Evaluation $OT Eval Moderate Complexity: 1 Mod G-Codes:     Khadija Thier A. Ulice Brilliant, M.S., OTR/L Acute Rehab Department: [phone]  Binnie Kand 11/12/2017, 10:31 AM

## 2017-11-12 NOTE — Progress Notes (Signed)
Physical Therapy Treatment Patient Details Name: Ryan [name] MRN: [id number] DOB: 12/22/1929 Today's Date: 11/12/2017    History of Present Illness 84yo male who was admitted for intractible back pain, has grade 2 L5-S1 anterolisthesis and stenosis, L5 pars defect, L3 center of levoconvex curvature, foraminal narrowing mult levels, L5 pars defect.  PMHx:  TAVR, bladder CA, hypoxia,     PT Comments    Patient is progressing very well towards their physical therapy goals. Demonstrates improved activity tolerance and mobility, ambulating 60 feet with RW and min guard assist. Also able to tolerate extended static standing at sink for grooming task. Prior to ambulation, SpO2 92% on RA and 96% after ambulation. Still feel that SNF is appropriate recommendation to maximize functional independence and decrease caregiver burden.    Follow Up Recommendations  SNF     Equipment Recommendations  None recommended by PT    Recommendations for Other Services       Precautions / Restrictions Precautions Precautions: Fall Precaution Comments: watch O2  Restrictions Weight Bearing Restrictions: No    Mobility  Bed Mobility Overal bed mobility: Needs Assistance Bed Mobility: Rolling;Sidelying to Sit;Sit to Sidelying Rolling: Supervision Sidelying to sit: Min assist     Sit to sidelying: Min assist General bed mobility comments: Assist for LEs OOB and back to bed. Cues for log roll technqiue. HOB flat with use of bed rails. Light assist to elevate trunk to sitting position  Transfers Overall transfer level: Needs assistance Equipment used: Rolling walker (2 wheeled) Transfers: Sit to/from Stand Sit to Stand: Min guard         General transfer comment: Cues for hand placement but pt pulling up in RW with both hands. Cues for posture and technique  Ambulation/Gait Ambulation/Gait assistance: Min guard Gait Distance (Feet): 60 Feet Assistive device: Rolling walker (2  wheeled) Gait Pattern/deviations: Step-through pattern;Decreased stride length;Trunk flexed;Narrow base of support;Decreased dorsiflexion - right;Decreased dorsiflexion - left     General Gait Details: cueing for upright posture and proximity to [employer] Mobility    Modified Rankin (Stroke Patients Only)       Balance Overall balance assessment: Needs assistance Sitting-balance support: Feet supported Sitting balance-Leahy Scale: Fair     Standing balance support: No upper extremity supported;During functional activity Standing balance-Leahy Scale: Fair Standing balance comment: static standing at sink during grooming task                            Cognition Arousal/Alertness: Awake/alert Behavior During Therapy: WFL for tasks assessed/performed Overall Cognitive Status: Within Functional Limits for tasks assessed                                        Exercises Other Exercises Other Exercises: Manual BLE stretching including hamstrings and knee to chest. Noted increased left hamstring tightness     General Comments        Pertinent Vitals/Pain Pain Assessment: 0-10 Pain Score: 8  Pain Location: back Pain Descriptors / Indicators: Aching;Sharp;Stabbing Pain Intervention(s): Monitored during session;Repositioned;Limited activity within patient's tolerance    Home Living Family/patient expects to be discharged to:: Skilled nursing facility Living Arrangements: Spouse/significant other Available Help at Discharge: Family;Available 24 hours/day Type of Home: House Home Access: Stairs to enter Entrance Stairs-Rails: Right  Home Layout: One level Home Equipment: Walker - 2 wheels;Shower seat      Prior Function Level of Independence: Independent with assistive device(s)      Comments: Rollator for mobility. Use of shower chair and supplemental O2 recently   PT Goals (current goals can now be found in  the care plan section) Acute Rehab PT Goals Patient Stated Goal: decrease pain PT Goal Formulation: With patient/family Time For Goal Achievement: 11/23/17 Potential to Achieve Goals: Good Progress towards PT goals: Progressing toward goals    Frequency    Min 2X/week      PT Plan Current plan remains appropriate    Co-evaluation   Reason for Co-Treatment: For patient/therapist safety   OT goals addressed during session: ADL's and self-care      AM-PAC PT "6 Clicks" Daily Activity  Outcome Measure  Difficulty turning over in bed (including adjusting bedclothes, sheets and blankets)?: A Lot Difficulty moving from lying on back to sitting on the side of the bed? : Unable Difficulty sitting down on and standing up from a chair with arms (e.g., wheelchair, bedside commode, etc,.)?: A Lot Help needed moving to and from a bed to chair (including a wheelchair)?: A Little Help needed walking in hospital room?: A Little Help needed climbing 3-5 steps with a railing? : A Lot 6 Click Score: 13    End of Session Equipment Utilized During Treatment: Gait belt       PT Visit Diagnosis: Unsteadiness on feet (R26.81);Muscle weakness (generalized) (M62.81);Difficulty in walking, not elsewhere classified (R26.2);Pain Pain - part of body: (back)     Time: 0950-1009 PT Time Calculation (min) (ACUTE ONLY): 19 min  Charges:  $Gait Training: 8-22 mins                    G Codes:      Ellamae Sia, PT, DPT Acute Rehabilitation Services  Pager: [phone]    Willy Eddy 11/12/2017, 12:05 PM

## 2017-11-12 NOTE — Care Management Important Message (Signed)
Important Message  Patient Details  Name: Ryan [name] MRN: [id number] Date of Birth: 1929/07/02   Medicare Important Message Given:  Yes    Henri Baumler Montine Circle 11/12/2017, 2:52 PM

## 2017-11-12 NOTE — Progress Notes (Signed)
[facility] for Infectious Disease  Date of Admission:  11/07/2017   Total days of antibiotics 4        Day 4 ampicillin         Day 3 ceftriaxone          Patient ID: Ryan [name] is a 84 y.o. male with  Principal Problem:   Prosthetic valve endocarditis ([place]) Active Problems:   Enterococcal bacteremia   Subacute bacterial endocarditis   Severe aortic stenosis   PAF (paroxysmal atrial fibrillation) (HCC)   Chronic combined systolic (congestive) and diastolic (congestive) heart failure (HCC)   CAD (coronary artery disease), native coronary artery   Amiodarone pulmonary toxicity   Intractable back pain   Elevated troponin   Urinary retention   Thrombocytopenia ([place])   . atorvastatin  20 mg Oral Daily  . budesonide  0.5 mg Nebulization Daily  . famotidine  20 mg Oral Daily  . feeding supplement (ENSURE ENLIVE)  237 mL Oral BID BM  . latanoprost  1 drop Both Eyes QHS  . levothyroxine  12.5 mcg Oral QAC breakfast  . lidocaine  1 patch Transdermal Q24H  . metoprolol succinate  25 mg Oral Daily  . mometasone-formoterol  2 puff Inhalation BID  . predniSONE  40 mg Oral Q breakfast  . vitamin B-12  500 mcg Oral Daily    SUBJECTIVE: Feeling much better per his wife. He is eating better and moving better. Planning on D/C to [facility] for a few weeks for rehab then home.   Allergies  Allergen Reactions  . Ciprofloxacin Anaphylaxis  . Hydrochlorothiazide Anaphylaxis  . Sulfa Antibiotics Anaphylaxis  . Ace Inhibitors Cough  . Losartan Cough  . Carvedilol Cough    Pt states it "causes him too cough."  . Lisinopril Cough    Pt reports worsening cough and "feeling wheezy."    OBJECTIVE: Vitals:   11/11/17 2116 11/12/17 0448 11/12/17 0900 11/12/17 0945  BP:  (!) 144/74  92/74  Pulse:  78  75  Resp:  16  16  Temp:  98.1 F (36.7 C)  (!) 97.5 F (36.4 C)  TempSrc:    Oral  SpO2: 96% 100% 94% 98%  Weight:      Height:       Body mass index is 23.3  kg/m.  Physical Exam  Constitutional: No distress.  Chronically ill appearing elderly man. Asleep in bed  Cardiovascular: Normal rate and normal heart sounds. An irregular rhythm present.  Pulmonary/Chest: Effort normal. No respiratory distress.  Abdominal: Soft. Bowel sounds are normal.  Musculoskeletal:  Limited mobility d/t back pain   Neurological:  Asleep   Skin: Skin is warm and dry.    Lab Results Lab Results  Component Value Date   WBC 16.3 (H) 11/12/2017   HGB 12.6 (L) 11/12/2017   HCT 39.9 11/12/2017   MCV 81.9 11/12/2017   PLT 107 (L) 11/12/2017    Lab Results  Component Value Date   CREATININE 0.92 11/10/2017   BUN 17 11/10/2017   NA 140 11/10/2017   K 4.2 11/10/2017   CL 104 11/10/2017   CO2 24 11/10/2017    Lab Results  Component Value Date   ALT 41 11/09/2017   AST 41 11/09/2017   ALKPHOS 87 11/09/2017   BILITOT 0.9 11/09/2017     Microbiology: BCx 6/9 >> 1/2 confirmed enterococcus faecalis (2nd set pending ID)  BCx 6/10 >> pending   ASSESSMENT: 84  y.o. male admitted with severe acute on chronic lower back pain. Found to have leukocytosis and enterococcal bacteremia. TEE is positive for vegetations involving the prosthetic TAVR. With creatinine clearance just at 50 mL/min will add ceftriaxone 2gm IV q12h WITH ampicillin for synergy instead of gentamicin with risk for nephrotoxicity. He will need longer course (6 weeks) of IV therapy with PVE with surveillance TEE prior to stopping therapy.   OK to place PICC line Friday (order entered so IV tam can plan) - I requested dual lumen in anticipation of likely reverting to continuous ampicillin after D/C from SNF to ease the burden of medication administration for his wife.   I welcomed and answered all questions he and his wife had today. They are aware of treatment course. Plan is to discharge to [facility] initially then home with home health care services. I have discussed with Carolynn Sayers with [facility]  about him.   PLAN: 1. Enterococcal Bacteremia = TEE positive for endocarditis involving prosthetic valve.Will need 6 weeks of dual IV therapy. OPAT detailed below. PICC line to be placed tomorrow 6/14 then D/C OK per ID team.   2. PVE involving TAVR = will need surveillance TEE prior to stopping antibiotics. Will make appt in 4 weeks to see Dr. Linus Salmons.   3. Thrombocytopenia = improved to 100k  this morning with adding antibiotics. Likely related to infectious process. Will monitor with OPAT.   4. Back Pain = ?discitis vs acute on chronic exacerbation of degenerative issues. Continue antibiotics. Pain management per primary team appreciated. Treatment for endocarditis will provide adequate treatment for this as well although would favor that this is more of an acute flare of a chronic degenerative problem.   OPAT ORDERS:  Diagnosis: Enterococcal Endocarditis of Prosthetic Valve   Culture Result: Enterococcus faecalis (Amp MIC < 2)  Allergies  Allergen Reactions  . Ciprofloxacin Anaphylaxis  . Hydrochlorothiazide Anaphylaxis  . Sulfa Antibiotics Anaphylaxis  . Ace Inhibitors Cough  . Losartan Cough  . Carvedilol Cough    Pt states it "causes him too cough."  . Lisinopril Cough    Pt reports worsening cough and "feeling wheezy."    Discharge antibiotics: Ampicillin 2 gm IV every 6 hours   + Ceftriaxone 2 gm IV every 12 hours   Duration: 6 weeks (or until FU TEE is done)   End Date: 12/22/17   [facility] Care and Maintenance Per Protocol __ Please pull PIC at completion of IV antibiotics _x_ Please leave PIC in place until doctor has seen patient or been notified  Labs weekly while on IV antibiotics: _x_ CBC with differential _x_ BMP  Fax weekly labs to [phone]  Clinic Follow Up Appt: ~ 4 weeks (week of 12/07/17)  with Dr. Linus Salmons.   Our office will call his wife next week to set this up so she has her schedule.   Janene Madeira, MSN, NP-C [facility] for  Infectious [place] Cell: [phone] Pager: [phone]  11/12/2017  12:18 PM

## 2017-11-13 LAB — CBC
HEMATOCRIT: 37.8 % — AB (ref 39.0–52.0)
Hemoglobin: 11.9 g/dL — ABNORMAL LOW (ref 13.0–17.0)
MCH: 26.1 pg (ref 26.0–34.0)
MCHC: 31.5 g/dL (ref 30.0–36.0)
MCV: 82.9 fL (ref 78.0–100.0)
Platelets: 106 10*3/uL — ABNORMAL LOW (ref 150–400)
RBC: 4.56 MIL/uL (ref 4.22–5.81)
RDW: 19.9 % — AB (ref 11.5–15.5)
WBC: 10.4 10*3/uL (ref 4.0–10.5)

## 2017-11-13 LAB — BASIC METABOLIC PANEL
Anion gap: 7 (ref 5–15)
BUN: 29 mg/dL — AB (ref 6–20)
CALCIUM: 8.5 mg/dL — AB (ref 8.9–10.3)
CO2: 27 mmol/L (ref 22–32)
CREATININE: 1.4 mg/dL — AB (ref 0.61–1.24)
Chloride: 106 mmol/L (ref 101–111)
GFR calc non Af Amer: 44 mL/min — ABNORMAL LOW (ref >60)
GFR, EST AFRICAN AMERICAN: 51 mL/min — AB (ref >60)
Glucose, Bld: 100 mg/dL — ABNORMAL HIGH (ref 65–99)
Potassium: 3.6 mmol/L (ref 3.5–5.1)
Sodium: 140 mmol/L (ref 135–145)

## 2017-11-13 MED ORDER — SODIUM CHLORIDE 0.9 % IV SOLN
INTRAVENOUS | Status: AC
  Administered 2017-11-13 – 2017-11-14 (×2): via INTRAVENOUS

## 2017-11-13 MED ORDER — APIXABAN 2.5 MG PO TABS
2.5000 mg | ORAL_TABLET | Freq: Two times a day (BID) | ORAL | Status: DC
  Administered 2017-11-13 – 2017-11-14 (×2): 2.5 mg via ORAL
  Filled 2017-11-13 (×2): qty 1

## 2017-11-13 MED ORDER — SENNA 8.6 MG PO TABS
1.0000 | ORAL_TABLET | Freq: Every day | ORAL | Status: DC | PRN
  Administered 2017-11-13: 8.6 mg via ORAL
  Filled 2017-11-13: qty 1

## 2017-11-13 MED ORDER — SODIUM CHLORIDE 0.9% FLUSH
10.0000 mL | INTRAVENOUS | Status: DC | PRN
  Administered 2017-11-14 (×2): 10 mL
  Filled 2017-11-13 (×2): qty 40

## 2017-11-13 NOTE — Progress Notes (Signed)
Okay to place PICC for home antibiotics per Janene Madeira NP.

## 2017-11-13 NOTE — Progress Notes (Signed)
ANTICOAGULATION CONSULT NOTE - Initial Consult  Pharmacy Consult for apixaban  Indication: atrial fibrillation  Allergies  Allergen Reactions  . Ciprofloxacin Anaphylaxis  . Hydrochlorothiazide Anaphylaxis  . Sulfa Antibiotics Anaphylaxis  . Ace Inhibitors Cough  . Losartan Cough  . Carvedilol Cough    Pt states it "causes him too cough."  . Lisinopril Cough    Pt reports worsening cough and "feeling wheezy."    Patient Measurements: Height: 5\' 5"  (165.1 cm) Weight: 130 lb (59 kg) IBW/kg (Calculated) : 61.5   Vital Signs: Temp: 97.9 F (36.6 C) (06/14 0756) Temp Source: Oral (06/14 0756) BP: 163/65 (06/14 0756) Pulse Rate: 60 (06/14 0756)  Labs: Recent Labs    11/12/17 0721 11/13/17 0719  HGB 12.6* 11.9*  HCT 39.9 37.8*  PLT 107* 106*  CREATININE  --  1.40*    Estimated Creatinine Clearance: 32.2 mL/min (A) (by C-G formula based on SCr of 1.4 mg/dL (H)).   Medical History: Past Medical History:  Diagnosis Date  . Age-related macular degeneration   . Allergic rhinitis   . Amiodarone toxicity   . Aortic valve stenosis    a. s/p TAVR 06/2016.  . Arthritis    "hands, lower back" (04/30/2016)  . Asthma   . Cardiomyopathy ([place])    a. EF declined to 35-40% by echo 09/2017 (previously 50-55% after TAVR).  . Carotid artery obstruction    a. s/p bilat CEA.  . Chronic combined systolic and diastolic CHF (congestive heart failure) ([place])   . Chronic lower back pain   . Coronary arteriosclerosis in native artery    a. 2017 Cardiac catheterization demonstrated worsening CAD with 70% mid LCx, 80% OM1 and 80% mid RCA stenosis. b.  In 11/17, he underwent successful rotational atherectomy and DES to RCA.  . Diverticulitis of colon   . DJD (degenerative joint disease)   . Dyspnea    "constant but the degree changes"  . GERD (gastroesophageal reflux disease)   . History of elevated PSA 05/2009  . History of hiatal hernia   . HLD (hyperlipidemia)   . Hypertension   .  Leukocytosis 10/2017  . Nocturnal hypoxemia   . OSA (obstructive sleep apnea)    intolerant to CPAP  . PAF (paroxysmal atrial fibrillation) ([place])   . Paroxysmal supraventricular tachycardia ([place])   . Pneumonia    "when I was a kid"  . Presence of permanent cardiac pacemaker   . Primary malignant neoplasm of bladder ([place])   . Prostate cancer [facility])       Assessment: 84 yo male with aortic valve stenosis status post TAVR, cardiomyopathy, A. Fib being treated with antibiotics for endocarditis. Apixiban held for thrombocytopenia. PLT now >100K. Apixaban to be restarted now that PICC line is in place. Patient >80yo and <60 kg therefore will resume previous adjusted dose.    Goal of Therapy:   Monitor platelets by anticoagulation protocol: Yes   Plan:  Restart apixaban 2.5mg  PO BID at 2200 Monitor PLT and for s/sx of bleeding   Toshiko Kemler A. Levada Dy, PharmD, [place] Pager: [phone]  11/13/2017,3:45 PM

## 2017-11-13 NOTE — Clinical Social Work Note (Addendum)
[employer] contacted and talked with representative [place]. Advised rep that patient will be ready for d/c on Saturday and wanted to assure that auth would be received today or Saturday. CSW informed that Josem Kaufmann should be provided today. CSW will continue to follow and weekend CSW will facilitate d/c to [place] [facility] for [place] [facility].  4:37 pm: CSW received call from [place] with authorization information:  *ID reference number #[id number], initally authorized for 3 days effective 6/15; RUG level - RVB *547 minutes therapy per week approved *Updates to be faxed to [phone] and Lanetta Inch is contact.  Dorene Sorrow with [facility] contacted and provided with authorization information. MD contacted and informed that auth received. Family advised that auth received.   Evalena Fujii Givens, MSW, LCSW Licensed Clinical Social Worker [place] [phone]

## 2017-11-13 NOTE — Progress Notes (Signed)
Progress Note  Patient Name: Ryan [name] Date of Encounter: 11/13/2017  Primary Cardiologist: Ena Dawley, MD   Lovena Le  Subjective  Breathing is good   No CP    Inpatient Medications    Scheduled Meds: . atorvastatin  20 mg Oral Daily  . budesonide  0.5 mg Nebulization Daily  . famotidine  20 mg Oral Daily  . feeding supplement (ENSURE ENLIVE)  237 mL Oral BID BM  . latanoprost  1 drop Both Eyes QHS  . levothyroxine  12.5 mcg Oral QAC breakfast  . lidocaine  1 patch Transdermal Q24H  . metoprolol tartrate  50 mg Oral BID  . mometasone-formoterol  2 puff Inhalation BID  . polyethylene glycol  17 g Oral Daily  . predniSONE  40 mg Oral Q breakfast  . vitamin B-12  500 mcg Oral Daily   Continuous Infusions: . ampicillin (OMNIPEN) IV Stopped (11/13/17 0500)  . cefTRIAXone (ROCEPHIN)  IV 2 g (11/13/17 0645)   PRN Meds: acetaminophen, albuterol, bisacodyl, cyclobenzaprine, HYDROmorphone (DILAUDID) injection, lip balm, menthol-cetylpyridinium, nitroGLYCERIN, ondansetron **OR** ondansetron (ZOFRAN) IV, oxyCODONE-acetaminophen, traZODone   Vital Signs    Vitals:   11/12/17 2035 11/12/17 2116 11/12/17 2245 11/13/17 0351  BP:  113/90  (!) 159/78  Pulse:  78  68  Resp:  16  16  Temp:  97.7 F (36.5 C)  (!) 97.5 F (36.4 C)  TempSrc:  Oral  Oral  SpO2: 97% 93%  100%  Weight:   59 kg (130 lb)   Height:        Intake/Output Summary (Last 24 hours) at 11/13/2017 0646 Last data filed at 11/13/2017 0500 Gross per 24 hour  Intake 2840 ml  Output 200 ml  Net 2640 ml   Filed Weights   11/10/17 1307 11/10/17 2028 11/12/17 2245  Weight: 63.5 kg (140 lb) 63.5 kg (140 lb 0.1 oz) 59 kg (130 lb)    Telemetry    SR this am   Had about 1 hour of atrial fib last night   100s  - Personally Reviewed  ECG      Physical Exam   GEN:  Thin 84 yo in No acute distress.   Neck:  JVP is normal   Cardiac: RRR, I-II/VI  Systolic murmur   No, rubs, or gallops.  Respiratory:  Clear to auscultation bilaterally. GI: Soft, nontender, non-distended  MS: No edema; No deformity. Neuro:  Nonfocal  Psych: Normal affect   Labs    Chemistry Recent Labs  Lab 11/07/17 0849 11/08/17 0231 11/09/17 0613 11/10/17 0628  NA 141 139 138 140  K 4.0 4.1 4.4 4.2  CL 107 103 103 104  CO2 25 27 26 24   GLUCOSE 97 98 149* 110*  BUN 20 17 24* 17  CREATININE 1.18 1.03 1.13 0.92  CALCIUM 8.7* 8.7* 8.9 8.8*  PROT 6.0*  --  5.9*  --   ALBUMIN 3.0*  --  2.8*  --   AST 48*  --  41  --   ALT 49  --  41  --   ALKPHOS 90  --  87  --   BILITOT 0.9  --  0.9  --   GFRNONAA 54* >60 57* >60  GFRAA >60 >60 >60 >60  ANIONGAP 9 9 9 12      Hematology Recent Labs  Lab 11/09/17 0613 11/10/17 0628 11/12/17 0721  WBC 17.7* 17.3* 16.3*  RBC 4.84 4.68 4.87  HGB 12.6* 12.1* 12.6*  HCT 39.8 37.9*  39.9  MCV 82.2 81.0 81.9  MCH 26.0 25.9* 25.9*  MCHC 31.7 31.9 31.6  RDW 19.2* 19.6* 19.9*  PLT 54* 71* 107*    Cardiac Enzymes Recent Labs  Lab 11/07/17 0849 11/07/17 1227 11/07/17 1834 11/08/17 0231  TROPONINI 0.20* 0.09* 0.08* 0.08*   No results for input(s): TROPIPOC in the last 168 hours.   BNP Recent Labs  Lab 11/07/17 0849  BNP 219.3*     DDimer  Recent Labs  Lab 11/08/17 1310  DDIMER 2.65*     Radiology    [place] Ekg Site Rite  Result Date: 11/12/2017 If Site Rite image not attached, placement could not be confirmed due to current cardiac rhythm.   Cardiac Studies   TEE  Study Conclusions  - Left ventricle: Systolic function was mildly reduced. The   estimated ejection fraction was in the range of 45% to 50%. - Aortic valve: A bioprosthesis was present. There was a large, 1.8   cm (L) x 1.8 cm (W), multilobulated, highly mobile vegetation on   the aortic aspect; the appearance is consistent with vegetation.   There was a medium-sized, 1.1 cm (L) x 0.4 cm (W), irregular,   mobile vegetation on the left ventricular aspect; the appearance   is  consistent with vegetation. There was trivial regurgitation. - Mitral valve: There was mild to moderate regurgitation. - Left atrium: No evidence of thrombus in the atrial cavity or   appendage. - Tricuspid valve: No evidence of vegetation.  Impressions:  - 3 D images of the Aortic valve were obtained.   There are 2 complex masses associated with the valve.   There is a large , multilobulated vegetation on the supravalvular   aspect of the valve and a small vegetation on the subvalvular   aspect of the valve.   There is trivial perivalvular aortic insufficiency  Patient Profile     84 y.o. male with history of CAD, PAF, PPM, AV dz, s/p TAVR.  Admitted with back pain   Found to be bacteremic      Assessment & Plan    1.  ID   Pt on ABX for endocarditis.   ID monitoring   Will get PICC line today  2  Paroxysmal atrial fib.  Episode last night of atrial fib  Rates better  On higher dose of metoprolol    Start Eliquis after PICC placement    3.  CAD   No symtoms to sugg angina  4  Chronic systolic CHF   MIld LV dysfunction Volume is good   5  HTN  BP is a little high at times  I would follow a little while longer  before advancing meds   OK to D/C after PICC placemnt   Will make sure has f/u  In clinic    For questions or updates, please contact [facility] Please consult [email or web address] for contact info under Cardiology/STEMI.      Signed, Dorris Carnes, MD  11/13/2017, 6:46 AM

## 2017-11-13 NOTE — Progress Notes (Signed)
PROGRESS NOTE    Ryan [name]  JJO:[id number] DOB: 1930/03/12 DOA: 11/07/2017 PCP: Dorothyann Peng, NP    Brief Narrative:  Ryan [name] is a very pleasant 84 y.o. male with medical history significant for carotid artery disease status post bilateral CEA, CAD status post PTCA/DES to RCA 04/2016, aortic stenosis status post TAVR 06/2016, HTN, HLD, prior SVT, OSA, chronic combined CHF, cardiomyopathy status post PPM, paroxysmal atrial fibrillation on Eliquis who presented to ED on 11/07/2017 with progressive left lower back pain of 3 days duration not relieved by pain medications. Patient was recently admitted for suspected amiodarone induced lung toxicity at which time he was started on prednisone and spironolactone.  He was doing better until 3 days PTA when he developed worsening left low back pain. Now has enterococcal bacteremia with prosthetic valve endocarditis. Regarding diagnosis of Back pain, differential diagnosis includes spinal stenosis, vs discitis, vs spinal epidural hematoma. Neurosurgery recommend oral prednisone for 7 days, continue with IV antibiotics. ID, cardiology and neurosurgery consulted.   Assessment & Plan:   Principal Problem:   Prosthetic valve endocarditis (HCC) Active Problems:   Severe aortic stenosis   PAF (paroxysmal atrial fibrillation) (HCC)   Chronic combined systolic (congestive) and diastolic (congestive) heart failure (HCC)   CAD (coronary artery disease), native coronary artery   Amiodarone pulmonary toxicity   Intractable back pain   Elevated troponin   Urinary retention   Thrombocytopenia (HCC)   Enterococcal bacteremia   Subacute bacterial endocarditis   Intractable Back pain; could be related to exacerbation of spondylosis, Vs Discitis, unable to rule out epidural hematoma.  -CT showed spondylosis, mild to moderate multilevel foraminal stenosis, pars defect L 4-5.  -Needs prednisone for back pain for 1 week, then further dose of prednisone  for amiodarone toxicity.  -Blood culture positive for enterococcus.  -Neurosurgery has been consulted, they recommend medical management and no need to do CT myelogram.  -CT abdomen negative for retroperitoneal Bleed.  -He was on prednisone for recent prednisone toxicity, he was supposed to be on taper dose. Currently getting 40 mg daily since 11/08/2017.  At discharge recommend prednisone 30 mg daily x1 week followed by maintenance dose of 20 mg daily until outpatient follow-up with pulmonology regarding amiodarone lung toxicity.  -Discussed with Dr Kathyrn Sheriff, unable to rule out discitis with CT spine. Ancillary and secondary test like TAG RBC scan, PET scan, wont change management regarding treatment for discitis. Regarding spinal bleed, patient would have symptoms of cord compression if he had  large hematoma. We are not able to rule out small hematoma in the spine.  -Dr Kathyrn Sheriff, neurosurgery reassessed on 6/11 and indicated that his acute onset of back pain is due to 1 of 3 possibilities: Exacerbation of chronic lumbar spondylosis, spontaneous spinal EDH related to Eliquis or discitis.  He recommends nonoperative treatment, continue antibiotics for his bacteremia and if stroke risk felt to be high without Eliquis then consider resuming it.  Due to ongoing issues with A. fib, cardiology has reassessed and started him on Eliquis post PICC line placement on 11/13/2017. -ESR and CRP elevated.  - ID also recommending repeating CT spine in 2 weeks, to determine length of treatment.  -Please see below for antibiotic recommendations by ID.  Back pain continues to steadily improve.  Enterococcal bacteremia with prosthetic aortic valve (TAVR) endocarditis  blood culture grew enteroccocus. Second set gram positive cocci in pair.  As per ID, on ampicillin and ceftriaxone for dual beta-lactam therapy.  Recommend 6 weeks  through December 22, 2017 and follow-up TEE by cardiology prior to stopping treatment in about 5  to 6 weeks.  If TEE okay at that time then stop antibiotics.  Surveillance blood cultures to be drawn 3 to 5 days after antibiotic completion.  Surveillance blood cultures 6/11 continue to be negative.  PICC line placed 6/14.  Will need lab monitoring per OPAT protocol at discharge.   ID will arrange outpatient follow-up in 4 weeks.  Relative transition point in the sigmoid no evidence of obstruction.  -CT abdomen pelvis.  negative for retroperitoneal bleed. Stool through the colon, relative transition point, no obstruction. Discussed with radiologist transition point could be physiologic, vs adhesions. . Discussed with family he has prior history of adhesions, prior sx.  Oncology recommend GI consultation.  -Evaluated by GI, hold on colonoscopy, evaluate for endocarditis and Discitis.  - not had BM in 2 days.  After discussing with patient and family, bowel regimen adjusted.  Thrombocytopenia;  Dr Lottie Rater consulted/.  Now that blood cultures are positive, thrombocytopenia could be related to infection. Follow trend.  Platelets stable in the low 100 range for the last 2 days.  Continue to follow CBCs closely.  Mild elevation of troponin; flat  He denies chest pain.  EKG no significant changes.  Suspect stress related to acute illness.   Chronic combine systolic Diastolic HF;  Appears compensated.  Poor oral intake. Hold spironolactone.   Paroxysmal A fib;  Cardiology input from 6/11 appreciated.  Dr. Harrington Challenger reviewed findings with Drs. Lovena Le and Cameron [place].  He had 3 minutes of A. fib on last pacer interrogation.  Due to concerns for bleeding, possible hematoma in the back, cardiology initially recommended holding Eliquis for now and consider resuming in next few weeks for stroke prophylaxis from A. Fib. However since 6/13, patient had episodes of intermittent A. fib with RVR.  Metoprolol dose was adjusted and increased.  Cardiology recommended starting Eliquis after PICC line placement which was  done 6/14.  Cardiology has cleared him for discharge.  They will arrange outpatient follow-up.  Urine retention; he has been able to urinate.  Resolved.  Constipation; Miralax, Senakot , and PRN dulcolax suppository.  Patient has not had BM in last 2 days.  Adjusted bowel regimen.  Chronic Hypoxic Respiratory failure. On Oxygen at home.  Closely monitor/reassess oxygen requirement at SNF.  Recently  diagnosed with amiodarone toxicity.  Last seen by pulmonology on 11/03/2017 when prednisone taper was recommended as below  Prednisone 40 mg daily x1 week (will complete 1 week for low back pain in the hospital tomorrow), then 30 mg daily for 1 week, then 20 mg daily until follow-up with Dr. Lamonte Sakai in 3 to 4 weeks.  Pulmonary status has been stable.  Acute kidney injury Likely related to poor oral intake and dehydration.  Brief IV fluids and follow BMP in a.m.  DVT prophylaxis: scd.  Initiated Eliquis on 11/13/2017 Code Status: full code.  Family Communication: I discussed in detail with patient's daughter and spouse at bedside.  Subsequently also discussed with patient's son-in-law on the phone.  He is a Retail buyer. Disposition Plan: Likely DC to SNF 6/15 pending normalization of creatinine.  Consultants:   Oncology   Neurosurgery   Cardiology  Infectious disease   Procedures:  TEE PICC line 6/14   Antimicrobials: As above  Subjective: Back pain continues to improve.  No BM for 2 days.  No chest pain or dyspnea reported.   Objective: Vitals:   11/13/17 0351  11/13/17 0756 11/13/17 0836 11/13/17 1704  BP: (!) 159/78 (!) 163/65  (!) 160/72  Pulse: 68 60  66  Resp: 16 17  18   Temp: (!) 97.5 F (36.4 C) 97.9 F (36.6 C)  97.9 F (36.6 C)  TempSrc: Oral Oral    SpO2: 100% 99% 98% 99%  Weight:      Height:        Intake/Output Summary (Last 24 hours) at 11/13/2017 1736 Last data filed at 11/13/2017 1706 Gross per 24 hour  Intake 1955 ml  Output 800 ml    Net 1155 ml   Filed Weights   11/10/17 1307 11/10/17 2028 11/12/17 2245  Weight: 63.5 kg (140 lb) 63.5 kg (140 lb 0.1 oz) 59 kg (130 lb)    Examination:  General exam: Pleasant elderly male, moderately built and frail lying comfortably supine in bed.  Did not appear in any distress. Respiratory system: Clear to auscultation.  No increased work of breathing.  Stable. Cardiovascular system: S1 and S2 heard, RRR.  No JVD, murmurs or pedal edema.  Telemetry personally reviewed: A paced rhythm. Gastrointestinal system: Abdomen is nondistended, soft and nontender.  Normal bowel sounds heard.  Stable. Central nervous system: Alert and oriented x3.  No focal neurological deficits.  Stable. Extremities: Symmetric power.   Skin: few ecchymosis.  Psychiatry: Mood and affect appropriate     Data Reviewed: I have personally reviewed following labs and imaging studies  CBC: Recent Labs  Lab 11/07/17 0849  11/08/17 1310 11/09/17 0613 11/10/17 0628 11/12/17 0721 11/13/17 0719  WBC 11.3*   < > 11.5* 17.7* 17.3* 16.3* 10.4  NEUTROABS 9.3*  --  9.9*  --  15.4*  --   --   HGB 12.3*   < > 12.1* 12.6* 12.1* 12.6* 11.9*  HCT 39.5   < > 38.4* 39.8 37.9* 39.9 37.8*  MCV 82.3   < > 81.2 82.2 81.0 81.9 82.9  PLT 60*   < > 51* 54* 71* 107* 106*   < > = values in this interval not displayed.   Basic Metabolic Panel: Recent Labs  Lab 11/07/17 0849 11/08/17 0231 11/09/17 0613 11/10/17 0628 11/13/17 0719  NA 141 139 138 140 140  K 4.0 4.1 4.4 4.2 3.6  CL 107 103 103 104 106  CO2 25 27 26 24 27   GLUCOSE 97 98 149* 110* 100*  BUN 20 17 24* 17 29*  CREATININE 1.18 1.03 1.13 0.92 1.40*  CALCIUM 8.7* 8.7* 8.9 8.8* 8.5*   GFR: Estimated Creatinine Clearance: 32.2 mL/min (A) (by C-G formula based on SCr of 1.4 mg/dL (H)). Liver Function Tests: Recent Labs  Lab 11/07/17 0849 11/09/17 0613  AST 48* 41  ALT 49 41  ALKPHOS 90 87  BILITOT 0.9 0.9  PROT 6.0* 5.9*  ALBUMIN 3.0* 2.8*    Coagulation Profile: Recent Labs  Lab 11/07/17 0849 11/08/17 0231 11/10/17 0628  INR 1.33 1.30 1.16   Cardiac Enzymes: Recent Labs  Lab 11/07/17 0849 11/07/17 1227 11/07/17 1834 11/08/17 0231  TROPONINI 0.20* 0.09* 0.08* 0.08*   BNP (last 3 results) Recent Labs    10/20/17 1058  PROBNP 146.0*   CBG: Recent Labs  Lab 11/11/17 1216 11/12/17 0726  GLUCAP 135* 105*     Recent Results (from the past 240 hour(s))  Culture, blood (routine x 2)     Status: Abnormal   Collection Time: 11/08/17  9:20 AM  Result Value Ref Range Status   Specimen Description BLOOD RIGHT  ANTECUBITAL  Final   Special Requests   Final    BOTTLES DRAWN AEROBIC AND ANAEROBIC Blood Culture adequate volume   Culture  Setup Time   Final    GRAM POSITIVE COCCI IN PAIRS IN BOTH AEROBIC AND ANAEROBIC BOTTLES CRITICAL VALUE NOTED.  VALUE IS CONSISTENT WITH PREVIOUSLY REPORTED AND CALLED VALUE. Performed at [facility], [place] [address], [place], [place] [address]    Culture (A)  Final    ENTEROCOCCUS FAECALIS SUSCEPTIBILITIES PERFORMED ON PREVIOUS CULTURE WITHIN THE LAST 5 DAYS.    Report Status 11/11/2017 FINAL  Final  Culture, blood (routine x 2)     Status: Abnormal   Collection Time: 11/08/17  9:27 AM  Result Value Ref Range Status   Specimen Description BLOOD RIGHT HAND  Final   Special Requests   Final    BOTTLES DRAWN AEROBIC ONLY Blood Culture adequate volume   Culture  Setup Time   Final    GRAM POSITIVE COCCOBACILLUS AEROBIC BOTTLE ONLY CRITICAL RESULT CALLED TO, READ BACK BY AND VERIFIED WITH: PHARMD E MARTIN 11/09/17 AT 838 BY CM Performed at [facility], [place] [address] [place], [place] [address]    Culture ENTEROCOCCUS FAECALIS (A)  Final   Report Status 11/11/2017 FINAL  Final   Organism ID, Bacteria ENTEROCOCCUS FAECALIS  Final      Susceptibility   Enterococcus faecalis - MIC*    AMPICILLIN <=2 SENSITIVE Sensitive     VANCOMYCIN 2 SENSITIVE Sensitive      GENTAMICIN SYNERGY SENSITIVE Sensitive     * ENTEROCOCCUS FAECALIS  Blood Culture ID Panel (Reflexed)     Status: Abnormal   Collection Time: 11/08/17  9:27 AM  Result Value Ref Range Status   Enterococcus species DETECTED (A) NOT DETECTED Final    Comment: RBV PHARMD E MARTIN 11/09/17 AT 838 BY CM    Vancomycin resistance NOT DETECTED NOT DETECTED Final   Listeria monocytogenes NOT DETECTED NOT DETECTED Final   Staphylococcus species NOT DETECTED NOT DETECTED Final   Staphylococcus aureus NOT DETECTED NOT DETECTED Final   Streptococcus species NOT DETECTED NOT DETECTED Final   Streptococcus agalactiae NOT DETECTED NOT DETECTED Final   Streptococcus pneumoniae NOT DETECTED NOT DETECTED Final   Streptococcus pyogenes NOT DETECTED NOT DETECTED Final   Acinetobacter baumannii NOT DETECTED NOT DETECTED Final   Enterobacteriaceae species NOT DETECTED NOT DETECTED Final   Enterobacter cloacae complex NOT DETECTED NOT DETECTED Final   Escherichia coli NOT DETECTED NOT DETECTED Final   Klebsiella oxytoca NOT DETECTED NOT DETECTED Final   Klebsiella pneumoniae NOT DETECTED NOT DETECTED Final   Proteus species NOT DETECTED NOT DETECTED Final   Serratia marcescens NOT DETECTED NOT DETECTED Final   Haemophilus influenzae NOT DETECTED NOT DETECTED Final   Neisseria meningitidis NOT DETECTED NOT DETECTED Final   Pseudomonas aeruginosa NOT DETECTED NOT DETECTED Final   Candida albicans NOT DETECTED NOT DETECTED Final   Candida glabrata NOT DETECTED NOT DETECTED Final   Candida krusei NOT DETECTED NOT DETECTED Final   Candida parapsilosis NOT DETECTED NOT DETECTED Final   Candida tropicalis NOT DETECTED NOT DETECTED Final    Comment: Performed at [facility], [place]. [address], [place], [place] [address]  Urine Culture     Status: Abnormal   Collection Time: 11/08/17  4:33 PM  Result Value Ref Range Status   Specimen Description URINE, RANDOM  Final   Special Requests NONE  Final   Culture  (A)  Final    <10,000 COLONIES/mL INSIGNIFICANT GROWTH Performed at [facility] [address] [place], [place] [address]    Report Status 11/09/2017 FINAL  Final  Culture, blood (Routine X 2) w Reflex to ID Panel     Status: None (Preliminary result)   Collection Time: 11/10/17  6:28 AM  Result Value Ref Range Status   Specimen Description BLOOD LEFT ARM  Final   Special Requests   Final    BOTTLES DRAWN AEROBIC ONLY Blood Culture results may not be optimal due to an inadequate volume of blood received in culture bottles   Culture   Final    NO GROWTH 3 DAYS Performed at [facility], [place] [address], [place], [place] [address]    Report Status PENDING  Incomplete  Culture, blood (Routine X 2) w Reflex to ID Panel     Status: None (Preliminary result)   Collection Time: 11/10/17  6:36 AM  Result Value Ref Range Status   Specimen Description BLOOD LEFT HAND  Final   Special Requests   Final    BOTTLES DRAWN AEROBIC ONLY Blood Culture results may not be optimal due to an inadequate volume of blood received in culture bottles   Culture   Final    NO GROWTH 3 DAYS Performed at [facility], [place] [address], [place], [place] [address]    Report Status PENDING  Incomplete         Radiology Studies: [place] Ekg Site Rite  Result Date: 11/12/2017 If Site Rite image not attached, placement could not be confirmed due to current cardiac rhythm.       Scheduled Meds: . apixaban  2.5 mg Oral BID  . atorvastatin  20 mg Oral Daily  . famotidine  20 mg Oral Daily  . feeding supplement (ENSURE ENLIVE)  237 mL Oral BID BM  . latanoprost  1 drop Both Eyes QHS  . levothyroxine  12.5 mcg Oral QAC breakfast  . lidocaine  1 patch Transdermal Q24H  . metoprolol tartrate  50 mg Oral BID  . mometasone-formoterol  2 puff Inhalation BID  . polyethylene glycol  17 g Oral Daily  . predniSONE  40 mg Oral Q breakfast  . vitamin B-12  500 mcg Oral Daily   Continuous  Infusions: . sodium chloride 60 mL/hr at 11/13/17 1043  . ampicillin (OMNIPEN) IV Stopped (11/13/17 1542)  . cefTRIAXone (ROCEPHIN)  IV 2 g (11/13/17 1706)     LOS: 4 days    Time spent: 35 minutes.    Vernell Leep, MD, FACP, [facility]. [facility] Pager [phone]  If 7PM-7AM, please contact night-coverage [email or web address] Password [facility] 11/13/2017, 5:36 PM

## 2017-11-13 NOTE — Progress Notes (Signed)
Peripherally Inserted Central Catheter/Midline Placement  The IV Nurse has discussed with the patient and/or persons authorized to consent for the patient, the purpose of this procedure and the potential benefits and risks involved with this procedure.  The benefits include less needle sticks, lab draws from the catheter, and the patient may be discharged home with the catheter. Risks include, but not limited to, infection, bleeding, blood clot (thrombus formation), and puncture of an artery; nerve damage and irregular heartbeat and possibility to perform a PICC exchange if needed/ordered by physician.  Alternatives to this procedure were also discussed.  Bard Power PICC patient education guide, fact sheet on infection prevention and patient information card has been provided to patient /or left at bedside.    PICC/Midline Placement Documentation  PICC Single Lumen 88/91/69 PICC Right Basilic 36 cm 0 cm (Active)  Indication for Insertion or Continuance of Line Home intravenous therapies (PICC only) 11/13/2017  2:50 PM  Exposed Catheter (cm) 0 cm 11/13/2017  2:50 PM  Site Assessment Clean;Dry;Intact 11/13/2017  2:50 PM  Line Status Flushed;Saline locked;Blood return noted 11/13/2017  2:50 PM  Dressing Type Transparent;Securing device 11/13/2017  2:50 PM  Dressing Status Clean;Dry;Intact;Antimicrobial disc in place 11/13/2017  2:50 PM  Dressing Change Due 11/20/17 11/13/2017  2:50 PM       Frances Maywood 11/13/2017, 2:54 PM

## 2017-11-14 DIAGNOSIS — J449 Chronic obstructive pulmonary disease, unspecified: Secondary | ICD-10-CM | POA: Diagnosis not present

## 2017-11-14 DIAGNOSIS — E785 Hyperlipidemia, unspecified: Secondary | ICD-10-CM | POA: Diagnosis not present

## 2017-11-14 DIAGNOSIS — D649 Anemia, unspecified: Secondary | ICD-10-CM | POA: Diagnosis not present

## 2017-11-14 DIAGNOSIS — I350 Nonrheumatic aortic (valve) stenosis: Secondary | ICD-10-CM | POA: Diagnosis not present

## 2017-11-14 DIAGNOSIS — D696 Thrombocytopenia, unspecified: Secondary | ICD-10-CM | POA: Diagnosis not present

## 2017-11-14 DIAGNOSIS — R279 Unspecified lack of coordination: Secondary | ICD-10-CM | POA: Diagnosis not present

## 2017-11-14 DIAGNOSIS — T826XXD Infection and inflammatory reaction due to cardiac valve prosthesis, subsequent encounter: Secondary | ICD-10-CM | POA: Diagnosis not present

## 2017-11-14 DIAGNOSIS — R339 Retention of urine, unspecified: Secondary | ICD-10-CM | POA: Diagnosis not present

## 2017-11-14 DIAGNOSIS — J984 Other disorders of lung: Secondary | ICD-10-CM | POA: Diagnosis not present

## 2017-11-14 DIAGNOSIS — I119 Hypertensive heart disease without heart failure: Secondary | ICD-10-CM | POA: Diagnosis not present

## 2017-11-14 DIAGNOSIS — B952 Enterococcus as the cause of diseases classified elsewhere: Secondary | ICD-10-CM | POA: Diagnosis not present

## 2017-11-14 DIAGNOSIS — N390 Urinary tract infection, site not specified: Secondary | ICD-10-CM | POA: Diagnosis not present

## 2017-11-14 DIAGNOSIS — G8929 Other chronic pain: Secondary | ICD-10-CM | POA: Diagnosis not present

## 2017-11-14 DIAGNOSIS — Z950 Presence of cardiac pacemaker: Secondary | ICD-10-CM | POA: Diagnosis not present

## 2017-11-14 DIAGNOSIS — I471 Supraventricular tachycardia: Secondary | ICD-10-CM | POA: Diagnosis not present

## 2017-11-14 DIAGNOSIS — I739 Peripheral vascular disease, unspecified: Secondary | ICD-10-CM | POA: Diagnosis not present

## 2017-11-14 DIAGNOSIS — R55 Syncope and collapse: Secondary | ICD-10-CM | POA: Diagnosis not present

## 2017-11-14 DIAGNOSIS — M6281 Muscle weakness (generalized): Secondary | ICD-10-CM | POA: Diagnosis not present

## 2017-11-14 DIAGNOSIS — I110 Hypertensive heart disease with heart failure: Secondary | ICD-10-CM | POA: Diagnosis not present

## 2017-11-14 DIAGNOSIS — I330 Acute and subacute infective endocarditis: Secondary | ICD-10-CM | POA: Diagnosis not present

## 2017-11-14 DIAGNOSIS — Z952 Presence of prosthetic heart valve: Secondary | ICD-10-CM | POA: Diagnosis not present

## 2017-11-14 DIAGNOSIS — I10 Essential (primary) hypertension: Secondary | ICD-10-CM | POA: Diagnosis not present

## 2017-11-14 DIAGNOSIS — I2510 Atherosclerotic heart disease of native coronary artery without angina pectoris: Secondary | ICD-10-CM | POA: Diagnosis not present

## 2017-11-14 DIAGNOSIS — I480 Paroxysmal atrial fibrillation: Secondary | ICD-10-CM | POA: Diagnosis not present

## 2017-11-14 DIAGNOSIS — I495 Sick sinus syndrome: Secondary | ICD-10-CM | POA: Diagnosis not present

## 2017-11-14 DIAGNOSIS — I5032 Chronic diastolic (congestive) heart failure: Secondary | ICD-10-CM | POA: Diagnosis not present

## 2017-11-14 DIAGNOSIS — R7881 Bacteremia: Secondary | ICD-10-CM | POA: Diagnosis not present

## 2017-11-14 DIAGNOSIS — Z743 Need for continuous supervision: Secondary | ICD-10-CM | POA: Diagnosis not present

## 2017-11-14 DIAGNOSIS — M549 Dorsalgia, unspecified: Secondary | ICD-10-CM | POA: Diagnosis not present

## 2017-11-14 DIAGNOSIS — G4733 Obstructive sleep apnea (adult) (pediatric): Secondary | ICD-10-CM | POA: Diagnosis not present

## 2017-11-14 DIAGNOSIS — Z7901 Long term (current) use of anticoagulants: Secondary | ICD-10-CM | POA: Diagnosis not present

## 2017-11-14 DIAGNOSIS — M79604 Pain in right leg: Secondary | ICD-10-CM | POA: Diagnosis not present

## 2017-11-14 DIAGNOSIS — H3530 Unspecified macular degeneration: Secondary | ICD-10-CM | POA: Diagnosis not present

## 2017-11-14 DIAGNOSIS — I429 Cardiomyopathy, unspecified: Secondary | ICD-10-CM | POA: Diagnosis not present

## 2017-11-14 DIAGNOSIS — K219 Gastro-esophageal reflux disease without esophagitis: Secondary | ICD-10-CM | POA: Diagnosis not present

## 2017-11-14 DIAGNOSIS — J45909 Unspecified asthma, uncomplicated: Secondary | ICD-10-CM | POA: Diagnosis not present

## 2017-11-14 DIAGNOSIS — I4891 Unspecified atrial fibrillation: Secondary | ICD-10-CM | POA: Diagnosis not present

## 2017-11-14 DIAGNOSIS — Z7951 Long term (current) use of inhaled steroids: Secondary | ICD-10-CM | POA: Diagnosis not present

## 2017-11-14 DIAGNOSIS — N289 Disorder of kidney and ureter, unspecified: Secondary | ICD-10-CM | POA: Diagnosis not present

## 2017-11-14 DIAGNOSIS — Z882 Allergy status to sulfonamides status: Secondary | ICD-10-CM | POA: Diagnosis not present

## 2017-11-14 DIAGNOSIS — T462X5A Adverse effect of other antidysrhythmic drugs, initial encounter: Secondary | ICD-10-CM | POA: Diagnosis not present

## 2017-11-14 DIAGNOSIS — I38 Endocarditis, valve unspecified: Secondary | ICD-10-CM | POA: Diagnosis not present

## 2017-11-14 DIAGNOSIS — M545 Low back pain: Secondary | ICD-10-CM | POA: Diagnosis not present

## 2017-11-14 DIAGNOSIS — E7800 Pure hypercholesterolemia, unspecified: Secondary | ICD-10-CM | POA: Diagnosis not present

## 2017-11-14 DIAGNOSIS — I5042 Chronic combined systolic (congestive) and diastolic (congestive) heart failure: Secondary | ICD-10-CM | POA: Diagnosis not present

## 2017-11-14 LAB — BASIC METABOLIC PANEL
ANION GAP: 6 (ref 5–15)
BUN: 26 mg/dL — ABNORMAL HIGH (ref 6–20)
CO2: 27 mmol/L (ref 22–32)
Calcium: 8.7 mg/dL — ABNORMAL LOW (ref 8.9–10.3)
Chloride: 106 mmol/L (ref 101–111)
Creatinine, Ser: 1.29 mg/dL — ABNORMAL HIGH (ref 0.61–1.24)
GFR calc Af Amer: 57 mL/min — ABNORMAL LOW (ref >60)
GFR, EST NON AFRICAN AMERICAN: 49 mL/min — AB (ref >60)
GLUCOSE: 121 mg/dL — AB (ref 65–99)
Potassium: 4.5 mmol/L (ref 3.5–5.1)
Sodium: 139 mmol/L (ref 135–145)

## 2017-11-14 LAB — CBC
HCT: 40.5 % (ref 39.0–52.0)
HEMOGLOBIN: 12.6 g/dL — AB (ref 13.0–17.0)
MCH: 26 pg (ref 26.0–34.0)
MCHC: 31.1 g/dL (ref 30.0–36.0)
MCV: 83.5 fL (ref 78.0–100.0)
Platelets: 125 10*3/uL — ABNORMAL LOW (ref 150–400)
RBC: 4.85 MIL/uL (ref 4.22–5.81)
RDW: 19.9 % — ABNORMAL HIGH (ref 11.5–15.5)
WBC: 13 10*3/uL — ABNORMAL HIGH (ref 4.0–10.5)

## 2017-11-14 MED ORDER — SODIUM CHLORIDE 0.9 % IV SOLN: 2.0000 g | Freq: Four times a day (QID) | INTRAVENOUS | 0 refills | Status: AC

## 2017-11-14 MED ORDER — ENSURE ENLIVE PO LIQD: 237.0000 mL | Freq: Two times a day (BID) | ORAL | 12 refills | Status: DC

## 2017-11-14 MED ORDER — METOPROLOL TARTRATE 50 MG PO TABS: 50.0000 mg | ORAL_TABLET | Freq: Two times a day (BID) | ORAL | 0 refills | Status: DC

## 2017-11-14 MED ORDER — OXYCODONE-ACETAMINOPHEN 5-325 MG PO TABS: 1.0000 | ORAL_TABLET | Freq: Four times a day (QID) | ORAL | 0 refills | Status: DC | PRN

## 2017-11-14 MED ORDER — TRAZODONE HCL 50 MG PO TABS: 25.0000 mg | ORAL_TABLET | Freq: Every evening | ORAL | 0 refills | Status: DC | PRN

## 2017-11-14 MED ORDER — LIDOCAINE 5 % EX PTCH: 1.0000 | MEDICATED_PATCH | CUTANEOUS | 0 refills | Status: DC

## 2017-11-14 MED ORDER — SODIUM CHLORIDE 0.9 % IV SOLN: 2.0000 g | Freq: Two times a day (BID) | INTRAVENOUS | 0 refills | Status: AC

## 2017-11-14 MED ORDER — POLYETHYLENE GLYCOL 3350 17 G PO PACK: 17.0000 g | PACK | Freq: Every day | ORAL | 0 refills | Status: DC

## 2017-11-14 MED ORDER — SENNA 8.6 MG PO TABS: 1.0000 | ORAL_TABLET | Freq: Every day | ORAL | 0 refills | Status: DC | PRN

## 2017-11-14 MED ORDER — HEPARIN SOD (PORK) LOCK FLUSH 100 UNIT/ML IV SOLN
250.0000 [IU] | INTRAVENOUS | Status: AC | PRN
  Administered 2017-11-14: 250 [IU]

## 2017-11-14 NOTE — Clinical Social Work Note (Signed)
Clinical Social Worker facilitated patient discharge including contacting patient family and facility to confirm patient discharge plans.  Clinical information faxed to facility and family agreeable with plan.  CSW arranged ambulance transport via [facility] to [place].  RN to call [phone] for report prior to discharge.  Clinical Social Worker will sign off for now as social work intervention is no longer needed. Please consult [place] again if new need arises.  Mahamad Jabbi LCSWA [phone]

## 2017-11-14 NOTE — Discharge Summary (Signed)
Physician Discharge Summary  Patient ID: Ryan [name] MRN: [id number] DOB/AGE: February 19, 1930 84 y.o.  Admit date: 11/07/2017 Discharge date: 11/14/2017  Admission Diagnoses:  Discharge Diagnoses:  Principal Problem:   Prosthetic valve endocarditis ([place]) Active Problems:   Severe aortic stenosis   PAF (paroxysmal atrial fibrillation) (HCC)   Chronic combined systolic (congestive) and diastolic (congestive) heart failure (HCC)   CAD (coronary artery disease), native coronary artery   Amiodarone pulmonary toxicity   Intractable back pain   Elevated troponin   Urinary retention   Thrombocytopenia (HCC)   Enterococcal bacteremia   Subacute bacterial endocarditis Acute kidney injury   Discharged Condition: stable  Hospital Course: Ryan [name] a very pleasant14 year old Caucasian male with past medical history significant for carotid artery disease status post bilateral CEA, CAD status post PTCA/DES to RCA 04/2016, aortic stenosis status post TAVR 06/2016, HTN, HLD, prior SVT, OSA, chronic combined CHF, cardiomyopathy status post PPM, paroxysmal atrial fibrillation on Eliquis who presented to ED on 11/07/2017 with progressive left lower back pain of 3 days duration not relieved by pain medications. Patient was recently admitted for suspected amiodarone induced lung toxicity at which time he was started on prednisone and spironolactone.  He was doing better until 3 days prior to admission when he developed worsening left low back pain.  Work-up done during the hospital stay revealed enterococcal bacteremia with prosthetic valve endocarditis. Neurosurgery was consulted for the back pain as well, and oral prednisone was recommended for a week.  Patient has been on IV antibiotics for the endocarditis.  Infectious disease and cardiology team assisted in patient's management during the hospital stay.  Patient will be discharged to skilled nursing facility to complete course of antibiotics.   During the hospital stay, the patient developed an acute kidney injury.  Acute kidney injury improved with hydration.  Serum creatinine is 1.29 today.  There will be need for the skilled nursing facility physician to recheck patient's renal function in 2 days.     Intractable Back pain; could be related to exacerbation of spondylosis, Vs Discitis, unable to rule out epidural hematoma.  -CT showed spondylosis, mild to moderate multilevel foraminal stenosis, pars defect L 4-5.  -1 week prednisone therapy was recommended, then further dose of prednisone for amiodarone toxicity.  -Blood culture positive for enterococcus.  -Neurosurgery was consulted, they recommend medical management and no need to do CT myelogram.  -CT abdomen negative for retroperitoneal Bleed.  -He was on prednisone for recent prednisone toxicity, he was supposed to be on tapering dose. Currently getting 40 mg daily since 11/08/2017.  At discharge recommend prednisone 30 mg daily x1 week followed by maintenance dose of 20 mg daily until outpatient follow-up with pulmonology regarding amiodarone lung toxicity.  -Unable to rule out discitis with CT spine. Ancillary and secondary test like TAG RBC scan, PET scan, wont change management regarding treatment for discitis. Regarding spinal bleed, patient would have symptoms of cord compression if he had  large hematoma. We are not able to rule out small hematoma in the spine.  -Neurosurgery reassessed on 6/11 and indicated that his acute onset of back pain is due to 1 of 3 possibilities: Exacerbation of chronic lumbar spondylosis, spontaneous spinal EDH related to Eliquis or discitis.  He recommends nonoperative treatment, continue antibiotics for his bacteremia and if stroke risk felt to be high without Eliquis then consider resuming it.  Due to ongoing issues with A. fib, cardiology has reassessed and started him on Eliquis post PICC line  placement on 11/13/2017. -ESR and CRP elevated.  - ID also  recommending repeating CT spine in 2 weeks, to determine length of treatment.  -Please will be on IV ampicillin and Rocephin until December 22, 2017, at least.   -Back pain has improved significantly.    Enterococcal bacteremia with prosthetic aortic valve (TAVR) endocarditis  blood culture grew enteroccocus. Second set gram positive cocci in pair.  As per ID, on ampicillin and ceftriaxone for dual beta-lactam therapy.  Recommend 6 weeks through December 22, 2017 and follow-up TEE by cardiology prior to stopping treatment in about 5 to 6 weeks.  If TEE okay at that time then stop antibiotics.  Surveillance blood cultures to be drawn 3 to 5 days after antibiotic completion.  Surveillance blood cultures 6/11 continued to be negative.  PICC line placed 6/14.  Will need lab monitoring on discharge.   Infectious Disease will arrange outpatient follow-up.  Relative transition point in the sigmoid no evidence of obstruction.  -CT abdomen pelvis.  negative for retroperitoneal bleed. Stool through the colon, relative transition point, no obstruction. Discussed with radiologist transition point could be physiologic, vs adhesions. . Discussed with family he has prior history of adhesions, prior sx.  Oncology recommend GI consultation.  -Evaluated by GI, hold on colonoscopy, evaluate for endocarditis and Discitis.  -After discussing with patient and family, bowel regimen was adjusted.  Thrombocytopenia;  Hematologist, Dr Lottie Rater consulted.  Continue to follow CBC closely.  Mild elevation of troponin; flat  Patient denied chest pain.  EKG no significant changes.   Chronic combine systolic Diastolic HF;  Appears compensated.  Diuretics on hold due to acute kidney injury.   Paroxysmal A fib;  Cardiology input from 6/11 appreciated.  Dr. Harrington Challenger reviewed findings with Drs. Lovena Le and [place].  He had 3 minutes of A. fib on last pacer interrogation.  Due to concerns for bleeding, possible hematoma in the back,  cardiology initially recommended holding Eliquis for now and consider resuming in next few weeks for stroke prophylaxis from A. Fib. However since 6/13, patient had episodes of intermittent A. fib with RVR.  Metoprolol dose was adjusted and increased.  Cardiology recommended starting Eliquis after PICC line placement which was done 6/14.  Cardiology has cleared him for discharge.  They will arrange outpatient follow-up.  Urine retention -Resolved.  Constipation: Continue to manage expectantly.  Chronic Hypoxic Respiratory failure. On Oxygen at home.  Closely monitor/reassess oxygen requirement at SNF.  Recently  diagnosed with amiodarone toxicity.  Last seen by pulmonology on 11/03/2017 when prednisone taper was recommended as below  Prednisone 40 mg daily x1 week (will complete 1 week for low back pain in the hospital tomorrow), then 30 mg daily for 1 week, then 20 mg daily until follow-up with Dr. Lamonte Sakai in 3 to 4 weeks.  Pulmonary status has been stable.  Acute kidney injury Likely related to volume depletion.   Encourage liberal oral fluid on discharge. Physician at the skilled nursing facility should consider repeating basic metabolic profile in 2 days. Continue to monitor renal function and electrolytes.     Consults: ID, Cardiology and PCP within 1-2 weeks  Significant Diagnostic Studies:  Blood culture grew Enterococcus faecalis.  Echocardiogram:  - "Left ventricle: Systolic function was mildly reduced. The   estimated ejection fraction was in the range of 45% to 50%. - Aortic valve: A bioprosthesis was present. There was a large, 1.8   cm (L) x 1.8 cm (W), multilobulated, highly mobile vegetation on  the aortic aspect; the appearance is consistent with vegetation.   There was a medium-sized, 1.1 cm (L) x 0.4 cm (W), irregular,   mobile vegetation on the left ventricular aspect; the appearance   is consistent with vegetation. There was trivial regurgitation. - Mitral  valve: There was mild to moderate regurgitation. - Left atrium: No evidence of thrombus in the atrial cavity or   appendage. - Tricuspid valve: No evidence of vegetation".     Discharge Exam: Blood pressure (!) 160/64, pulse 63, temperature 97.6 F (36.4 C), temperature source Oral, resp. rate 18, height 5' 5" (1.651 m), weight 59 kg (130 lb 1.1 oz), SpO2 100 %.   Disposition: Discharge disposition: 03-Skilled Nursing Facility   Discharge Instructions    Call MD for:   Complete by:  As directed    Please call MD if the symptoms worsen.   Diet - low sodium heart healthy   Complete by:  As directed    Discharge instructions   Complete by:  As directed    Physician at the skilled nursing facility should consider checking BMP in 2 days time.   Home infusion instructions [facility] May follow [place] Dosing Protocol; May administer Cathflo as needed to maintain patency of vascular access device.; Flushing of vascular access device: per [facility] Protocol: 0.9% NaCl pre/post medica...   Complete by:  As directed    Instructions:  May follow [place] Dosing Protocol   Instructions:  May administer Cathflo as needed to maintain patency of vascular access device.   Instructions:  Flushing of vascular access device: per [facility] Protocol: 0.9% NaCl pre/post medication administration and prn patency; Heparin 100 u/ml, 4m for implanted ports and Heparin 10u/ml, 569mfor all other central venous catheters.   Instructions:  May follow [facility] Anaphylaxis Protocol for First Dose Administration in the home: 0.9% NaCl at 25-50 ml/hr to maintain IV access for protocol meds. Epinephrine 0.3 ml IV/IM PRN and Benadryl 25-50 IV/IM PRN s/s of anaphylaxis.   Instructions:  [facility] Coordinator (RN) to assist per patient IV care needs in the home PRN.   Increase activity slowly   Complete by:  As directed      Allergies as of 11/14/2017      Reactions   Ciprofloxacin Anaphylaxis    Hydrochlorothiazide Anaphylaxis   Sulfa Antibiotics Anaphylaxis   Ace Inhibitors Cough   Losartan Cough   Carvedilol Cough   Pt states it "causes him too cough."   Lisinopril Cough   Pt reports worsening cough and "feeling wheezy."      Medication List    STOP taking these medications   acetaminophen 500 MG tablet Commonly known as:  TYLENOL   acetaminophen-codeine 300-30 MG tablet Commonly known as:  TYLENOL #3   COQ-10 PO   furosemide 20 MG tablet Commonly known as:  LASIX   metoprolol succinate 25 MG 24 hr tablet Commonly known as:  TOPROL XL   spironolactone 25 MG tablet Commonly known as:  ALDACTONE     TAKE these medications   albuterol (2.5 MG/3ML) 0.083% nebulizer solution Commonly known as:  PROVENTIL Take 3 mLs (2.5 mg total) by nebulization every 8 (eight) hours as needed for wheezing or shortness of breath.   ampicillin 2 g in sodium chloride 0.9 % 100 mL Inject 2 g into the vein every 6 (six) hours.   apixaban 2.5 MG Tabs tablet Commonly known as:  ELIQUIS Take 1 tablet (2.5 mg total) by mouth  2 (two) times daily.   atorvastatin 20 MG tablet Commonly known as:  LIPITOR Take 1 tablet (20 mg total) by mouth daily.   budesonide-formoterol 80-4.5 MCG/ACT inhaler Commonly known as:  SYMBICORT Inhale 2 puffs into the lungs 2 (two) times daily.   cefTRIAXone 2 g in sodium chloride 0.9 % 100 mL Inject 2 g into the vein every 12 (twelve) hours.   cholecalciferol 1000 units tablet Commonly known as:  VITAMIN D Take 1,000 Units by mouth 2 (two) times a week.   feeding supplement (ENSURE ENLIVE) Liqd Take 237 mLs by mouth 2 (two) times daily between meals.   latanoprost 0.005 % ophthalmic solution Commonly known as:  XALATAN Place 1 drop into both eyes at bedtime.   levothyroxine 25 MCG tablet Commonly known as:  SYNTHROID, LEVOTHROID Take 0.5 tablets (12.5 mcg total) by mouth daily before breakfast.   lidocaine 5 % Commonly known as:   LIDODERM Place 1 patch onto the skin daily. Remove & Discard patch within 12 hours or as directed by MD   menthol-cetylpyridinium 3 MG lozenge Commonly known as:  CEPACOL Take 1 lozenge (3 mg total) by mouth as needed for sore throat.   metoprolol tartrate 50 MG tablet Commonly known as:  LOPRESSOR Take 1 tablet (50 mg total) by mouth 2 (two) times daily.   nitroGLYCERIN 0.4 MG SL tablet Commonly known as:  NITROSTAT Place 1 tablet (0.4 mg total) under the tongue every 5 (five) minutes as needed.   oxyCODONE-acetaminophen 5-325 MG tablet Commonly known as:  PERCOCET/ROXICET Take 1 tablet by mouth every 6 (six) hours as needed for moderate pain.   polyethylene glycol packet Commonly known as:  MIRALAX / GLYCOLAX Take 17 g by mouth daily.   predniSONE 10 MG tablet Commonly known as:  DELTASONE Take 4 tablets (40 mg) daily for the next week, take 3 tablets (30 mg) daily for the following week, then take 2 tablets (20 mg) daily until follow-up appointment What changed:    how much to take  how to take this  when to take this  additional instructions   RHOPRESSA 0.02 % Soln Generic drug:  Netarsudil Dimesylate Place 1 drop into both eyes at bedtime. Sample given by the physician   senna 8.6 MG Tabs tablet Commonly known as:  SENOKOT Take 1 tablet (8.6 mg total) by mouth daily as needed for mild constipation.   traZODone 50 MG tablet Commonly known as:  DESYREL Take 0.5 tablets (25 mg total) by mouth at bedtime as needed for sleep.   VITAMIN B 12 PO Take 1 capsule by mouth daily.            Home Infusion Instuctions  (From admission, onward)        Start     Ordered   11/14/17 0000  Home infusion instructions [facility] May follow [place] Dosing Protocol; May administer Cathflo as needed to maintain patency of vascular access device.; Flushing of vascular access device: per [facility] Protocol: 0.9% NaCl pre/post medica...    Question Answer Comment   Instructions May follow [place] Dosing Protocol   Instructions May administer Cathflo as needed to maintain patency of vascular access device.   Instructions Flushing of vascular access device: per [facility] Protocol: 0.9% NaCl pre/post medication administration and prn patency; Heparin 100 u/ml, 76m for implanted ports and Heparin 10u/ml, 556mfor all other central venous catheters.   Instructions May follow [facility] Anaphylaxis Protocol for First Dose Administration in the  home: 0.9% NaCl at 25-50 ml/hr to maintain IV access for protocol meds. Epinephrine 0.3 ml IV/IM PRN and Benadryl 25-50 IV/IM PRN s/s of anaphylaxis.   Instructions [facility] Infusion Coordinator (RN) to assist per patient IV care needs in the home PRN.      11/14/17 0948     Follow-up [place] for Infectious Disease. Call.   Specialty:  Infectious Diseases Why:  Please call to schedule an appointment the week of July 8th with Dr. Linus Salmons or Colletta Maryland, NP.  Contact information: [place], [place]       Evans Lance, MD Follow up.   Specialty:  Cardiology Why:  office will call with date and time Contact information: [address] [place] [address] [phone]           Signed: Bonnell Public 11/14/2017, 9:49 AM

## 2017-11-14 NOTE — Clinical Social Work Placement (Signed)
   CLINICAL SOCIAL WORK PLACEMENT  NOTE  Date:  11/14/2017  Patient Details  Name: Ryan [name] MRN: [id number] Date of Birth: August 18, 1929  Clinical Social Work is seeking post-discharge placement for this patient at the [place] level of care (*CSW will initial, date and re-position this form in  chart as items are completed):  Yes   Patient/family provided with [place] Work Department's list of facilities offering this level of care within the geographic area requested by the patient (or if unable, by the patient's family).  Yes   Patient/family informed of their freedom to choose among providers that offer the needed level of care, that participate in Medicare, Medicaid or managed care program needed by the patient, have an available bed and are willing to accept the patient.  Yes   Patient/family informed of [place]'s ownership interest in [facility] and [facility], as well as of the fact that they are under no obligation to receive care at these facilities.  PASRR submitted to EDS on 11/11/17     PASRR number received on 11/11/17     Existing PASRR number confirmed on       FL2 transmitted to all facilities in geographic area requested by pt/family on 11/11/17     FL2 transmitted to all facilities within larger geographic area on       Patient informed that his/her managed care company has contracts with or will negotiate with certain facilities, including the following:        Yes   Patient/family informed of bed offers received.  Patient chooses bed at [facility] [place] recommends and patient chooses bed at      Patient to be transferred to [facility] [place] on 11/14/17.  Patient to be transferred to facility by [facility]     Patient family notified on 11/14/17 of transfer.  Name of family member notified:  Joycelyn Schmid     PHYSICIAN       Additional Comment:     _______________________________________________ Eileen Stanford, LCSW 11/14/2017, 10:36 AM

## 2017-11-14 NOTE — Progress Notes (Signed)
Patient went to SNF via [facility]. Family reviewed AVS with patient. PICC line in place and IV fluids disconnected by IV RN. Patient belongings with family.   Farley Ly RN

## 2017-11-15 LAB — CULTURE, BLOOD (ROUTINE X 2)
CULTURE: NO GROWTH
Culture: NO GROWTH

## 2017-11-16 ENCOUNTER — Telehealth: Payer: Self-pay | Admitting: Pulmonary Disease

## 2017-11-16 DIAGNOSIS — J449 Chronic obstructive pulmonary disease, unspecified: Secondary | ICD-10-CM | POA: Diagnosis not present

## 2017-11-16 DIAGNOSIS — M6281 Muscle weakness (generalized): Secondary | ICD-10-CM | POA: Diagnosis not present

## 2017-11-16 DIAGNOSIS — I4891 Unspecified atrial fibrillation: Secondary | ICD-10-CM | POA: Diagnosis not present

## 2017-11-16 DIAGNOSIS — I330 Acute and subacute infective endocarditis: Secondary | ICD-10-CM | POA: Diagnosis not present

## 2017-11-16 NOTE — Telephone Encounter (Signed)
NP received staff message from patients son in law stating that patient was currently in in-patient rehab facility and needed to cancel his appt until further notice. Appt cancelled. Nothing further needed at this time.

## 2017-11-17 ENCOUNTER — Telehealth: Payer: Self-pay | Admitting: *Deleted

## 2017-11-17 ENCOUNTER — Encounter: Payer: Self-pay | Admitting: Internal Medicine

## 2017-11-17 DIAGNOSIS — D649 Anemia, unspecified: Secondary | ICD-10-CM | POA: Diagnosis not present

## 2017-11-17 DIAGNOSIS — I10 Essential (primary) hypertension: Secondary | ICD-10-CM | POA: Diagnosis not present

## 2017-11-17 NOTE — Telephone Encounter (Signed)
Katie from [facility] called to ask about the stop date on this patient PICC as well as weekly labs for him. She advised he was discharged to them over the weekend and she wants to make sure he gets the labs we need.   After review of the patient chart advised his stop date for PICC is 12/21/17 and the weekly labs we get are Sed Rate, ESR, CBC w Diff, and CMP. Gave her the fax number and she gave fax number for me to fax over the stop date note from discharge summary.

## 2017-11-23 ENCOUNTER — Encounter: Payer: Self-pay | Admitting: Internal Medicine

## 2017-11-26 DIAGNOSIS — I5032 Chronic diastolic (congestive) heart failure: Secondary | ICD-10-CM | POA: Diagnosis not present

## 2017-11-26 DIAGNOSIS — R55 Syncope and collapse: Secondary | ICD-10-CM | POA: Diagnosis not present

## 2017-11-26 DIAGNOSIS — J984 Other disorders of lung: Secondary | ICD-10-CM | POA: Diagnosis not present

## 2017-11-27 ENCOUNTER — Other Ambulatory Visit: Payer: Self-pay | Admitting: Pharmacist

## 2017-11-27 NOTE — Progress Notes (Signed)
[facility] pharmacy lab review  

## 2017-12-01 ENCOUNTER — Ambulatory Visit: Payer: Medicare Other | Admitting: Pulmonary Disease

## 2017-12-02 ENCOUNTER — Ambulatory Visit: Payer: Medicare Other | Admitting: Nurse Practitioner

## 2017-12-02 ENCOUNTER — Ambulatory Visit: Payer: Medicare Other | Admitting: Internal Medicine

## 2017-12-02 ENCOUNTER — Encounter: Payer: Self-pay | Admitting: Internal Medicine

## 2017-12-02 ENCOUNTER — Encounter: Payer: Self-pay | Admitting: Cardiology

## 2017-12-02 ENCOUNTER — Ambulatory Visit (INDEPENDENT_AMBULATORY_CARE_PROVIDER_SITE_OTHER): Payer: Medicare Other | Admitting: Cardiology

## 2017-12-02 ENCOUNTER — Encounter: Payer: Self-pay | Admitting: *Deleted

## 2017-12-02 VITALS — BP 140/80 | HR 69 | Ht 65.0 in | Wt 126.2 lb

## 2017-12-02 VITALS — BP 140/80 | HR 69 | Ht 65.0 in | Wt 126.0 lb

## 2017-12-02 DIAGNOSIS — I10 Essential (primary) hypertension: Secondary | ICD-10-CM

## 2017-12-02 DIAGNOSIS — T826XXD Infection and inflammatory reaction due to cardiac valve prosthesis, subsequent encounter: Secondary | ICD-10-CM

## 2017-12-02 DIAGNOSIS — Z950 Presence of cardiac pacemaker: Secondary | ICD-10-CM

## 2017-12-02 DIAGNOSIS — J984 Other disorders of lung: Secondary | ICD-10-CM

## 2017-12-02 DIAGNOSIS — N289 Disorder of kidney and ureter, unspecified: Secondary | ICD-10-CM | POA: Insufficient documentation

## 2017-12-02 DIAGNOSIS — I480 Paroxysmal atrial fibrillation: Secondary | ICD-10-CM

## 2017-12-02 DIAGNOSIS — I38 Endocarditis, valve unspecified: Secondary | ICD-10-CM | POA: Diagnosis not present

## 2017-12-02 DIAGNOSIS — Z952 Presence of prosthetic heart valve: Secondary | ICD-10-CM

## 2017-12-02 DIAGNOSIS — T462X5A Adverse effect of other antidysrhythmic drugs, initial encounter: Secondary | ICD-10-CM

## 2017-12-02 DIAGNOSIS — I330 Acute and subacute infective endocarditis: Secondary | ICD-10-CM | POA: Diagnosis not present

## 2017-12-02 DIAGNOSIS — R7881 Bacteremia: Secondary | ICD-10-CM | POA: Diagnosis not present

## 2017-12-02 DIAGNOSIS — I495 Sick sinus syndrome: Secondary | ICD-10-CM

## 2017-12-02 DIAGNOSIS — B952 Enterococcus as the cause of diseases classified elsewhere: Secondary | ICD-10-CM

## 2017-12-02 DIAGNOSIS — I429 Cardiomyopathy, unspecified: Secondary | ICD-10-CM

## 2017-12-02 HISTORY — DX: Presence of prosthetic heart valve: Z95.2

## 2017-12-02 MED ORDER — ISOSORBIDE MONONITRATE ER 30 MG PO TB24: 15.0000 mg | ORAL_TABLET | Freq: Every day | ORAL | 1 refills | Status: DC

## 2017-12-02 NOTE — Patient Instructions (Signed)
Medication Instructions:  Your physician recommends that you continue on your current medications as directed. Please refer to the Current Medication list given to you today.  Labwork: None ordered.  Testing/Procedures: None ordered.  Follow-Up: Your physician wants you to follow-up in: one year with Dr. Lovena Le.   You will receive a reminder letter in the mail two months in advance. If you don't receive a letter, please call our office to schedule the follow-up appointment.  Remote monitoring is used to monitor your Pacemaker from home. This monitoring reduces the number of office visits required to check your device to one time per year. It allows [place] to keep an eye on the functioning of your device to ensure it is working properly. You are scheduled for a device check from home on 01/11/2018. You may send your transmission at any time that day. If you have a wireless device, the transmission will be sent automatically. After your physician reviews your transmission, you will receive a postcard with your next transmission date.  Any Other Special Instructions Will Be Listed Below (If Applicable).  If you need a refill on your cardiac medications before your next appointment, please call your pharmacy.

## 2017-12-02 NOTE — H&P (View-Only) (Signed)
Cardiology Office Note    Date:  12/02/2017  ID:  Ryan [name], DOB [birth year], MRN [id number] PCP:  Dorothyann Peng, NP  Cardiologist:  Ena Dawley, MD   Chief Complaint: Posthospitalization visit for prosthetic aortic valve endocarditis  History of Present Illness:  Ryan [name] a very pleasant85 year old Caucasian male with past medical history significant for carotid artery disease status post bilateral CEA, CAD status post PTCA/DES to RCA 04/2016, aortic stenosis status post TAVR 06/2016, HTN, HLD, prior SVT, OSA, chronic combined CHF, cardiomyopathy status post PPM, paroxysmal atrial fibrillation on Eliquis who presented to ED on 11/07/2017 with progressive left lower back pain of 3 days duration not relieved by pain medications.Patient was 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 He was doing better until 3 days prior to admission when he developed worsening left low back pain and profound fatigue.    He was admitted on November 07, 2017 and diagnosed with eterococcal bacteremia withprosthetic valve endocarditis.  There was a large vegetation seen on bioprosthetic aortic valve, no vegetation was seen on pacemaker leads.  Neurosurgery was consulted for the back pain as well, and oral prednisone was recommended for a week.  Patient has been on IV antibiotics IV ampicillin and Rocephin for the endocarditis.  Patient was discharged to skilled nursing facility to complete course of antibiotics, currently still ongoing until December 22, 2017 During the hospital stay, the patient developed an acute kidney injury believed to be secondary to dehydration and possible endocarditis embolization. Patient's intractable back pain was further evaluated, CT showed spondylosis, mild to moderate multilevel foraminal stenosis, pars defect L 4-5.   Neurosurgery reassessed on 6/11 and indicatedthat his acute onset of back pain is  due to 1 of 3 possibilities: Exacerbation of chronic lumbar spondylosis, spontaneous spinal EDH related to Eliquis or discitis. He recommends nonoperative treatment, continue antibiotics for his bacteremia and if stroke risk felt to be high without Eliquis then consider resuming it. Due to ongoing issues with A. fib, cardiology has reassessed and started him on Eliquis post PICC line placement on 11/13/2017.  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 Second set gram positive cocci in pair.  As per ID, on ampicillin and ceftriaxone for dual beta-lactam therapy. Recommend 6 weeks through December 22, 2017 and follow-up TEE by cardiology prior to stopping treatment in about 5 to 6 weeks. If TEE okay at that time then stop antibiotics. Surveillance blood cultures to be drawn 3 to 5 days after antibiotic completion. Surveillance blood cultures 6/11 continued to be negative. PICC line placed 6/14. Will need lab monitoring on discharge.  The patient denies any further fever or chills and significant improvement of energy since the discharge. Infectious Disease  is scheduled to see patient on December 09, 2017.   During the hospital stay the patient had mild elevation of troponin with flat trend, believed to be secondary to demand ischemia, unchanged EKG, no ischemic work-up was performed.  Today patient states that overall he has more energy, he denies any fever or chills, he continues to perform daily exercises, he has been feeling daily episodes of exertional throat pressure with palpitations that previously indicated atrial fibrillation episodes.  This does not prevent him from exercising.  No dizziness or syncope.  No lower extremity edema orthopnea or proximal nocturnal dyspnea.  Past Medical History:  Diagnosis Date  . Age-related macular degeneration   . Allergic rhinitis   .  Amiodarone toxicity   . Aortic valve stenosis    a. s/p TAVR  06/2016.  . Arthritis    "hands, lower back" (04/30/2016)  . Asthma   . Cardiomyopathy ([place])    a. EF declined to 35-40% by echo 09/2017 (previously 50-55% after TAVR).  . Carotid artery obstruction    a. s/p bilat CEA.  . Chronic combined systolic and diastolic CHF (congestive heart failure) ([place])   . Chronic lower back pain   . Coronary arteriosclerosis in native artery    a. 2017 Cardiac catheterization demonstrated worsening CAD with 70% mid LCx, 80% OM1 and 80% mid RCA stenosis. b.  In 11/17, he underwent successful rotational atherectomy and DES to RCA.  . Diverticulitis of colon   . DJD (degenerative joint disease)   . Dyspnea    "constant but the degree changes"  . GERD (gastroesophageal reflux disease)   . History of elevated PSA 05/2009  . History of hiatal hernia   . HLD (hyperlipidemia)   . Hypertension   . Leukocytosis 10/2017  . Nocturnal hypoxemia   . OSA (obstructive sleep apnea)    intolerant to CPAP  . PAF (paroxysmal atrial fibrillation) ([place])   . Paroxysmal supraventricular tachycardia ([place])   . Pneumonia    "when I was a kid"  . Presence of permanent cardiac pacemaker   . Primary malignant neoplasm of bladder ([place])   . Prostate cancer [facility])     Past Surgical History:  Procedure Laterality Date  . APPENDECTOMY    . CARDIAC CATHETERIZATION N/A 01/17/2016   Procedure: Right/Left Heart Cath and Coronary Angiography;  Surgeon: Belva Crome, MD;  Location: [facility] [facility];  Service: Cardiovascular;  Laterality: N/A;  . CARDIAC CATHETERIZATION N/A 04/30/2016   Procedure: Coronary/Graft Atherectomy;  Surgeon: Sherren Mocha, MD;  Location: [place] [facility];  Service: Cardiovascular;  Laterality: N/A;  . CAROTID ENDARTERECTOMY Bilateral   . CATARACT EXTRACTION W/ INTRAOCULAR LENS  IMPLANT, BILATERAL Bilateral   . COLON SURGERY  1981   Meckles Diverticulum with volvulus  . EP IMPLANTABLE DEVICE N/A 03/21/2016   Procedure: Pacemaker Implant;  Surgeon: Evans Lance, MD;  Location: [place] [facility];  Service: Cardiovascular;  Laterality: N/A;  . EYE SURGERY    . INGUINAL HERNIA REPAIR Right 2009  . INSERT / REPLACE / REMOVE PACEMAKER    . INSERTION PROSTATE RADIATION SEED    . TEE WITHOUT CARDIOVERSION N/A 06/10/2016   Procedure: TRANSESOPHAGEAL ECHOCARDIOGRAM (TEE);  Surgeon: Sherren Mocha, MD;  Location: [facility];  Service: Open Heart Surgery;  Laterality: N/A;  . TEE WITHOUT CARDIOVERSION N/A 11/10/2017   Procedure: TRANSESOPHAGEAL ECHOCARDIOGRAM (TEE);  Surgeon: Acie Fredrickson Wonda Cheng, MD;  Location: [facility];  Service: Cardiovascular;  Laterality: N/A;  . TONSILLECTOMY  ~ 1947  . TRANSCATHETER AORTIC VALVE REPLACEMENT, TRANSFEMORAL N/A 06/10/2016   Procedure: TRANSCATHETER AORTIC VALVE REPLACEMENT, TRANSFEMORAL;  Surgeon: Sherren Mocha, MD;  Location: [facility];  Service: Open Heart Surgery;  Laterality: N/A;    Current Medications: Current Meds  Medication Sig  . albuterol (PROVENTIL) (2.5 MG/3ML) 0.083% nebulizer solution Take 3 mLs (2.5 mg total) by nebulization every 8 (eight) hours as needed for wheezing or shortness of breath.  Marland Kitchen ampicillin 2 g in sodium chloride 0.9 % 100 mL Inject 2 g into the vein every 6 (six) hours.  Marland Kitchen apixaban (ELIQUIS) 2.5 MG TABS tablet Take 1 tablet (2.5 mg total) by mouth 2 (two) times daily.  Marland Kitchen atorvastatin (LIPITOR) 20 MG  tablet Take 1 tablet (20 mg total) by mouth daily.  . budesonide-formoterol (SYMBICORT) 80-4.5 MCG/ACT inhaler Inhale 2 puffs into the lungs 2 (two) times daily.  . cefTRIAXone 2 g in sodium chloride 0.9 % 100 mL Inject 2 g into the vein every 12 (twelve) hours.  . cholecalciferol (VITAMIN D) 1000 units tablet Take 1,000 Units by mouth 2 (two) times a week.   . Cyanocobalamin (VITAMIN B 12 PO) Take 1 capsule by mouth daily.  . feeding supplement, ENSURE ENLIVE, (ENSURE ENLIVE) LIQD Take 237 mLs by mouth 2 (two) times daily between meals.  . latanoprost (XALATAN) 0.005 % ophthalmic solution Place  1 drop into both eyes at bedtime.  Marland Kitchen levothyroxine (SYNTHROID, LEVOTHROID) 25 MCG tablet Take 0.5 tablets (12.5 mcg total) by mouth daily before breakfast.  . lidocaine (LIDODERM) 5 % Place 1 patch onto the skin daily. Remove & Discard patch within 12 hours or as directed by MD  . menthol-cetylpyridinium (CEPACOL) 3 MG lozenge Take 1 lozenge (3 mg total) by mouth as needed for sore throat.  . metoprolol tartrate (LOPRESSOR) 50 MG tablet Take 1 tablet (50 mg total) by mouth 2 (two) times daily.  Mckinley Jewel Dimesylate (RHOPRESSA) 0.02 % SOLN Place 1 drop into both eyes at bedtime. Sample given by the physician  . nitroGLYCERIN (NITROSTAT) 0.4 MG SL tablet Place 1 tablet (0.4 mg total) under the tongue every 5 (five) minutes as needed.  Marland Kitchen oxyCODONE-acetaminophen (PERCOCET/ROXICET) 5-325 MG tablet Take 1 tablet by mouth every 6 (six) hours as needed for moderate pain.  . polyethylene glycol (MIRALAX / GLYCOLAX) packet Take 17 g by mouth daily.  . predniSONE (DELTASONE) 10 MG tablet Take 4 tablets (40 mg) daily for the next week, take 3 tablets (30 mg) daily for the following week, then take 2 tablets (20 mg) daily until follow-up appointment (Patient taking differently: Take 40 mg by mouth daily with breakfast. Take 4 tablets (40 mg) daily for the next week, take 3 tablets (30 mg) daily for the following week, then take 2 tablets (20 mg) daily until follow-up appointment)  . senna (SENOKOT) 8.6 MG TABS tablet Take 1 tablet (8.6 mg total) by mouth daily as needed for mild constipation.  . traZODone (DESYREL) 50 MG tablet Take 0.5 tablets (25 mg total) by mouth at bedtime as needed for sleep.    Allergies:   Ciprofloxacin; Hydrochlorothiazide; Sulfa antibiotics; Ace inhibitors; Losartan; Carvedilol; and Lisinopril   Social History   Socioeconomic History  . Marital status: Married    Spouse name: Not on file  . Number of children: Not on file  . Years of education: Not on file  . Highest education  level: Not on file  Occupational History  . Occupation: retired  Scientific laboratory technician  . Financial resource strain: Not on file  . Food insecurity:    Worry: Not on file    Inability: Not on file  . Transportation needs:    Medical: Not on file    Non-medical: Not on file  Tobacco Use  . Smoking status: Never Smoker  . Smokeless tobacco: Never Used  Substance and Sexual Activity  . Alcohol use: No    Alcohol/week: 0.0 oz    Comment: 04/30/2016 "nothing in years"  . Drug use: No  . Sexual activity: Never  Lifestyle  . Physical activity:    Days per week: Not on file    Minutes per session: Not on file  . Stress: Not on file  Relationships  .  Social connections:    Talks on phone: Not on file    Gets together: Not on file    Attends religious service: Not on file    Active member of club or organization: Not on file    Attends meetings of clubs or organizations: Not on file    Relationship status: Not on file  Other Topics Concern  . Not on file  Social History Narrative   Retired    Three children - One in [place], One in [place], and one in [place].         Family History:  The patient's family history includes Heart disease in his father and mother.  ROS:   Please see the history of present illness.  All other systems are reviewed and otherwise negative.    PHYSICAL EXAM:   VS:  BP 140/80   Pulse 69   Ht 5\' 5"  (1.651 m)   Wt 126 lb 3.2 oz (57.2 kg)   SpO2 97%   BMI 21.00 kg/m   BMI: Body mass index is 21 kg/m. GEN: Well nourished, well developed chronically ill appearing WM, in no acute distress HEENT: normocephalic, atraumatic Neck: no JVD, carotid bruits, or masses Cardiac: RRR; no murmurs, rubs, or gallops, no edema  Respiratory:  Diminished throughout without wheezes/rales/rhonchi, normal work of breathing GI: soft, nontender, nondistended, + BS MS: no deformity or atrophy Skin: warm and dry, no rash Neuro:  Alert and Oriented x 3, Strength and sensation  are intact, follows commands. reporst generalized R eye blurriness that comes and goes Psych: euthymic mood, full affect  Wt Readings from Last 3 Encounters:  12/02/17 126 lb 3.2 oz (57.2 kg)  12/02/17 126 lb (57.2 kg)  11/14/17 130 lb 1.1 oz (59 kg)    Studies/Labs Reviewed:   EKG:  EKG was ordered today and personally reviewed by me and demonstrates atrial paced rhythm with prolonged AV conduction, left bundle branch block is no longer present.  Recent Labs: 10/20/2017: Pro B Natriuretic peptide (BNP) 146.0 11/07/2017: B Natriuretic Peptide 219.3; TSH 2.060 11/09/2017: ALT 41 11/14/2017: BUN 26; Creatinine, Ser 1.29; Hemoglobin 12.6; Platelets 125; Potassium 4.5; Sodium 139   Lipid Panel    Component Value Date/Time   CHOL 130 05/01/2017 0827   TRIG 70.0 05/01/2017 0827   HDL 56.00 05/01/2017 0827   CHOLHDL 2 05/01/2017 0827   VLDL 14.0 05/01/2017 0827   LDLCALC 60 05/01/2017 0827    Additional studies/ records that were reviewed today include: Summarized above  ASSESSMENT & PLAN:   1. Bioprosthetic aortic valve endocarditis -diagnosed on November 13, 2017, on antibiotics IV ampicillin and Rocephin for total of 6 weeks until December 22, 2017.  Follow-up with ID - Dr Linus Salmons on December 09, 2017, currently no fever chills, repeat labs today, we will obtain TEE on December 15, 2017, if no further vegetation we will discontinue antibiotics and obtain blood cultures within a week post discontinuation of antibiotics.  He is followed by Dr. Lovena Le, pacemaker will need to be removed only if he has recurrent enterococcal infection.  2. CAD -he has episodes of exertional throat pain, similar to previous anginal equivalent, his blood pressure is elevated, we will add Imdur 50 mg daily. Not on ASA due to concomitant Eliquis.  Continue metoprolol 50 mg p.o. twice daily.  3.  Paroxysmal atrial fibrillation, he is maintaining sinus rhythm, recently diagnosed with amiodarone toxicity, now only on metoprolol, will  continue.  He is finishing a course of prednisone.  3. Chronic combined CHF with cardiomyopathy - LV function has declined over the last several years. He's not had any angina. Appears euvolemic on exam.  4. Profound back pain  -differential diagnosis as per neurosurgery as above, currently completely resolved.    5. Sick sinus syndrome, status post pacemaker placement, working normally, followed by Dr. Lovena Le.  Disposition: TEE scheduled for December 15, 2017, follow-up with me in 4 weeks.  Medication Adjustments/Labs and Tests Ordered: Current medicines are reviewed at length with the patient today.  Concerns regarding medicines are outlined above. Medication changes, Labs and Tests ordered today are summarized above and listed in the Patient Instructions accessible in Encounters.   Signed, Ena Dawley, MD  12/02/2017 10:28 PM    [address], [place], [place]  [address] Phone: [phone]; Fax: [phone]

## 2017-12-02 NOTE — Patient Instructions (Signed)
Medication Instructions:   START TAKING IMDUR 15 MG ONCE DAILY    Labwork:  TODAY--CMET, CBC W DIFF, TSH, AND CRP    Testing/Procedures:  Your physician has requested that you have a TEE. During a TEE, sound waves are used to create images of your heart. It provides your doctor with information about the size and shape of your heart and how well your heart's chambers and valves are working. In this test, a transducer is attached to the end of a flexible tube that's guided down your throat and into your esophagus (the tube leading from you mouth to your stomach) to get a more detailed image of your heart. You are not awake for the procedure. Please see the instruction sheet given to you today. For further information please visit [email or web address].  YOUR TEE IS SCHEDULED FOR Tuesday 12/15/17 AT 6:30 AM FOR DR CHRISTOPHER TO DO--PLEASE ARRIVE AT [facility] AT 6:30 AM--PLEASE FOLLOW INSTRUCTION LETTER PROVIDED TO YOU IN CLINIC TODAY    Follow-Up:  WITH DR Meda Coffee IN 4 WEEKS--ON EITHER AUGUST 14th OR 15th--PLEASE ADD ON PER DR NELSON       If you need a refill on your cardiac medications before your next appointment, please call your pharmacy.

## 2017-12-02 NOTE — Progress Notes (Signed)
Cardiology Office Note    Date:  12/02/2017  ID:  Ryan [name], DOB 08/11/1929, MRN [id number] PCP:  Dorothyann Peng, NP  Cardiologist:  Ena Dawley, MD   Chief Complaint: Posthospitalization visit for prosthetic aortic valve endocarditis  History of Present Illness:  Ryan [name] a very pleasant78 year old Caucasian male with past medical history significant for carotid artery disease status post bilateral CEA, CAD status post PTCA/DES to RCA 04/2016, aortic stenosis status post TAVR 06/2016, HTN, HLD, prior SVT, OSA, chronic combined CHF, cardiomyopathy status post PPM, paroxysmal atrial fibrillation on Eliquis who presented to ED on 11/07/2017 with progressive left lower back pain of 3 days duration not relieved by pain medications.Patient was 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 He was doing better until 3 days prior to admission when he developed worsening left low back pain and profound fatigue.    He was admitted on November 07, 2017 and diagnosed with eterococcal bacteremia withprosthetic valve endocarditis.  There was a large vegetation seen on bioprosthetic aortic valve, no vegetation was seen on pacemaker leads.  Neurosurgery was consulted for the back pain as well, and oral prednisone was recommended for a week.  Patient has been on IV antibiotics IV ampicillin and Rocephin for the endocarditis.  Patient was discharged to skilled nursing facility to complete course of antibiotics, currently still ongoing until December 22, 2017 During the hospital stay, the patient developed an acute kidney injury believed to be secondary to dehydration and possible endocarditis embolization. Patient's intractable back pain was further evaluated, CT showed spondylosis, mild to moderate multilevel foraminal stenosis, pars defect L 4-5.   Neurosurgery reassessed on 6/11 and indicatedthat his acute onset of back pain is  due to 1 of 3 possibilities: Exacerbation of chronic lumbar spondylosis, spontaneous spinal EDH related to Eliquis or discitis. He recommends nonoperative treatment, continue antibiotics for his bacteremia and if stroke risk felt to be high without Eliquis then consider resuming it. Due to ongoing issues with A. fib, cardiology has reassessed and started him on Eliquis post PICC line placement on 11/13/2017.  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 Second set gram positive cocci in pair.  As per ID, on ampicillin and ceftriaxone for dual beta-lactam therapy. Recommend 6 weeks through December 22, 2017 and follow-up TEE by cardiology prior to stopping treatment in about 5 to 6 weeks. If TEE okay at that time then stop antibiotics. Surveillance blood cultures to be drawn 3 to 5 days after antibiotic completion. Surveillance blood cultures 6/11 continued to be negative. PICC line placed 6/14. Will need lab monitoring on discharge.  The patient denies any further fever or chills and significant improvement of energy since the discharge. Infectious Disease  is scheduled to see patient on December 09, 2017.   During the hospital stay the patient had mild elevation of troponin with flat trend, believed to be secondary to demand ischemia, unchanged EKG, no ischemic work-up was performed.  Today patient states that overall he has more energy, he denies any fever or chills, he continues to perform daily exercises, he has been feeling daily episodes of exertional throat pressure with palpitations that previously indicated atrial fibrillation episodes.  This does not prevent him from exercising.  No dizziness or syncope.  No lower extremity edema orthopnea or proximal nocturnal dyspnea.  Past Medical History:  Diagnosis Date  . Age-related macular degeneration   . Allergic rhinitis   .  Amiodarone toxicity   . Aortic valve stenosis    a. s/p TAVR  06/2016.  . Arthritis    "hands, lower back" (04/30/2016)  . Asthma   . Cardiomyopathy ([place])    a. EF declined to 35-40% by echo 09/2017 (previously 50-55% after TAVR).  . Carotid artery obstruction    a. s/p bilat CEA.  . Chronic combined systolic and diastolic CHF (congestive heart failure) ([place])   . Chronic lower back pain   . Coronary arteriosclerosis in native artery    a. 2017 Cardiac catheterization demonstrated worsening CAD with 70% mid LCx, 80% OM1 and 80% mid RCA stenosis. b.  In 11/17, he underwent successful rotational atherectomy and DES to RCA.  . Diverticulitis of colon   . DJD (degenerative joint disease)   . Dyspnea    "constant but the degree changes"  . GERD (gastroesophageal reflux disease)   . History of elevated PSA 05/2009  . History of hiatal hernia   . HLD (hyperlipidemia)   . Hypertension   . Leukocytosis 10/2017  . Nocturnal hypoxemia   . OSA (obstructive sleep apnea)    intolerant to CPAP  . PAF (paroxysmal atrial fibrillation) ([place])   . Paroxysmal supraventricular tachycardia ([place])   . Pneumonia    "when I was a kid"  . Presence of permanent cardiac pacemaker   . Primary malignant neoplasm of bladder ([place])   . Prostate cancer [facility])     Past Surgical History:  Procedure Laterality Date  . APPENDECTOMY    . CARDIAC CATHETERIZATION N/A 01/17/2016   Procedure: Right/Left Heart Cath and Coronary Angiography;  Surgeon: Belva Crome, MD;  Location: [place] [facility];  Service: Cardiovascular;  Laterality: N/A;  . CARDIAC CATHETERIZATION N/A 04/30/2016   Procedure: Coronary/Graft Atherectomy;  Surgeon: Sherren Mocha, MD;  Location: [place] [facility];  Service: Cardiovascular;  Laterality: N/A;  . CAROTID ENDARTERECTOMY Bilateral   . CATARACT EXTRACTION W/ INTRAOCULAR LENS  IMPLANT, BILATERAL Bilateral   . COLON SURGERY  1981   Meckles Diverticulum with volvulus  . EP IMPLANTABLE DEVICE N/A 03/21/2016   Procedure: Pacemaker Implant;  Surgeon: Evans Lance, MD;  Location: [place] [facility];  Service: Cardiovascular;  Laterality: N/A;  . EYE SURGERY    . INGUINAL HERNIA REPAIR Right 2009  . INSERT / REPLACE / REMOVE PACEMAKER    . INSERTION PROSTATE RADIATION SEED    . TEE WITHOUT CARDIOVERSION N/A 06/10/2016   Procedure: TRANSESOPHAGEAL ECHOCARDIOGRAM (TEE);  Surgeon: Sherren Mocha, MD;  Location: [place];  Service: Open Heart Surgery;  Laterality: N/A;  . TEE WITHOUT CARDIOVERSION N/A 11/10/2017   Procedure: TRANSESOPHAGEAL ECHOCARDIOGRAM (TEE);  Surgeon: Acie Fredrickson Wonda Cheng, MD;  Location: [facility] ENDOSCOPY;  Service: Cardiovascular;  Laterality: N/A;  . TONSILLECTOMY  ~ 1947  . TRANSCATHETER AORTIC VALVE REPLACEMENT, TRANSFEMORAL N/A 06/10/2016   Procedure: TRANSCATHETER AORTIC VALVE REPLACEMENT, TRANSFEMORAL;  Surgeon: Sherren Mocha, MD;  Location: [place];  Service: Open Heart Surgery;  Laterality: N/A;    Current Medications: Current Meds  Medication Sig  . albuterol (PROVENTIL) (2.5 MG/3ML) 0.083% nebulizer solution Take 3 mLs (2.5 mg total) by nebulization every 8 (eight) hours as needed for wheezing or shortness of breath.  Marland Kitchen ampicillin 2 g in sodium chloride 0.9 % 100 mL Inject 2 g into the vein every 6 (six) hours.  Marland Kitchen apixaban (ELIQUIS) 2.5 MG TABS tablet Take 1 tablet (2.5 mg total) by mouth 2 (two) times daily.  Marland Kitchen atorvastatin (LIPITOR) 20 MG  tablet Take 1 tablet (20 mg total) by mouth daily.  . budesonide-formoterol (SYMBICORT) 80-4.5 MCG/ACT inhaler Inhale 2 puffs into the lungs 2 (two) times daily.  . cefTRIAXone 2 g in sodium chloride 0.9 % 100 mL Inject 2 g into the vein every 12 (twelve) hours.  . cholecalciferol (VITAMIN D) 1000 units tablet Take 1,000 Units by mouth 2 (two) times a week.   . Cyanocobalamin (VITAMIN B 12 PO) Take 1 capsule by mouth daily.  . feeding supplement, ENSURE ENLIVE, (ENSURE ENLIVE) LIQD Take 237 mLs by mouth 2 (two) times daily between meals.  . latanoprost (XALATAN) 0.005 % ophthalmic solution Place  1 drop into both eyes at bedtime.  Marland Kitchen levothyroxine (SYNTHROID, LEVOTHROID) 25 MCG tablet Take 0.5 tablets (12.5 mcg total) by mouth daily before breakfast.  . lidocaine (LIDODERM) 5 % Place 1 patch onto the skin daily. Remove & Discard patch within 12 hours or as directed by MD  . menthol-cetylpyridinium (CEPACOL) 3 MG lozenge Take 1 lozenge (3 mg total) by mouth as needed for sore throat.  . metoprolol tartrate (LOPRESSOR) 50 MG tablet Take 1 tablet (50 mg total) by mouth 2 (two) times daily.  Mckinley Jewel Dimesylate (RHOPRESSA) 0.02 % SOLN Place 1 drop into both eyes at bedtime. Sample given by the physician  . nitroGLYCERIN (NITROSTAT) 0.4 MG SL tablet Place 1 tablet (0.4 mg total) under the tongue every 5 (five) minutes as needed.  Marland Kitchen oxyCODONE-acetaminophen (PERCOCET/ROXICET) 5-325 MG tablet Take 1 tablet by mouth every 6 (six) hours as needed for moderate pain.  . polyethylene glycol (MIRALAX / GLYCOLAX) packet Take 17 g by mouth daily.  . predniSONE (DELTASONE) 10 MG tablet Take 4 tablets (40 mg) daily for the next week, take 3 tablets (30 mg) daily for the following week, then take 2 tablets (20 mg) daily until follow-up appointment (Patient taking differently: Take 40 mg by mouth daily with breakfast. Take 4 tablets (40 mg) daily for the next week, take 3 tablets (30 mg) daily for the following week, then take 2 tablets (20 mg) daily until follow-up appointment)  . senna (SENOKOT) 8.6 MG TABS tablet Take 1 tablet (8.6 mg total) by mouth daily as needed for mild constipation.  . traZODone (DESYREL) 50 MG tablet Take 0.5 tablets (25 mg total) by mouth at bedtime as needed for sleep.    Allergies:   Ciprofloxacin; Hydrochlorothiazide; Sulfa antibiotics; Ace inhibitors; Losartan; Carvedilol; and Lisinopril   Social History   Socioeconomic History  . Marital status: Married    Spouse name: Not on file  . Number of children: Not on file  . Years of education: Not on file  . Highest education  level: Not on file  Occupational History  . Occupation: retired  Scientific laboratory technician  . Financial resource strain: Not on file  . Food insecurity:    Worry: Not on file    Inability: Not on file  . Transportation needs:    Medical: Not on file    Non-medical: Not on file  Tobacco Use  . Smoking status: Never Smoker  . Smokeless tobacco: Never Used  Substance and Sexual Activity  . Alcohol use: No    Alcohol/week: 0.0 oz    Comment: 04/30/2016 "nothing in years"  . Drug use: No  . Sexual activity: Never  Lifestyle  . Physical activity:    Days per week: Not on file    Minutes per session: Not on file  . Stress: Not on file  Relationships  .  Social connections:    Talks on phone: Not on file    Gets together: Not on file    Attends religious service: Not on file    Active member of club or organization: Not on file    Attends meetings of clubs or organizations: Not on file    Relationship status: Not on file  Other Topics Concern  . Not on file  Social History Narrative   Retired    Three children - One in [place], One in [place], and one in [place].         Family History:  The patient's family history includes Heart disease in his father and mother.  ROS:   Please see the history of present illness.  All other systems are reviewed and otherwise negative.    PHYSICAL EXAM:   VS:  BP 140/80   Pulse 69   Ht 5\' 5"  (1.651 m)   Wt 126 lb 3.2 oz (57.2 kg)   SpO2 97%   BMI 21.00 kg/m   BMI: Body mass index is 21 kg/m. GEN: Well nourished, well developed chronically ill appearing WM, in no acute distress HEENT: normocephalic, atraumatic Neck: no JVD, carotid bruits, or masses Cardiac: RRR; no murmurs, rubs, or gallops, no edema  Respiratory:  Diminished throughout without wheezes/rales/rhonchi, normal work of breathing GI: soft, nontender, nondistended, + BS MS: no deformity or atrophy Skin: warm and dry, no rash Neuro:  Alert and Oriented x 3, Strength and sensation  are intact, follows commands. reporst generalized R eye blurriness that comes and goes Psych: euthymic mood, full affect  Wt Readings from Last 3 Encounters:  12/02/17 126 lb 3.2 oz (57.2 kg)  12/02/17 126 lb (57.2 kg)  11/14/17 130 lb 1.1 oz (59 kg)    Studies/Labs Reviewed:   EKG:  EKG was ordered today and personally reviewed by me and demonstrates atrial paced rhythm with prolonged AV conduction, left bundle branch block is no longer present.  Recent Labs: 10/20/2017: Pro B Natriuretic peptide (BNP) 146.0 11/07/2017: B Natriuretic Peptide 219.3; TSH 2.060 11/09/2017: ALT 41 11/14/2017: BUN 26; Creatinine, Ser 1.29; Hemoglobin 12.6; Platelets 125; Potassium 4.5; Sodium 139   Lipid Panel    Component Value Date/Time   CHOL 130 05/01/2017 0827   TRIG 70.0 05/01/2017 0827   HDL 56.00 05/01/2017 0827   CHOLHDL 2 05/01/2017 0827   VLDL 14.0 05/01/2017 0827   LDLCALC 60 05/01/2017 0827    Additional studies/ records that were reviewed today include: Summarized above  ASSESSMENT & PLAN:   1. Bioprosthetic aortic valve endocarditis -diagnosed on November 13, 2017, on antibiotics IV ampicillin and Rocephin for total of 6 weeks until December 22, 2017.  Follow-up with ID - Dr Linus Salmons on December 09, 2017, currently no fever chills, repeat labs today, we will obtain TEE on December 15, 2017, if no further vegetation we will discontinue antibiotics and obtain blood cultures within a week post discontinuation of antibiotics.  He is followed by Dr. Lovena Le, pacemaker will need to be removed only if he has recurrent enterococcal infection.  2. CAD -he has episodes of exertional throat pain, similar to previous anginal equivalent, his blood pressure is elevated, we will add Imdur 50 mg daily. Not on ASA due to concomitant Eliquis.  Continue metoprolol 50 mg p.o. twice daily.  3.  Paroxysmal atrial fibrillation, he is maintaining sinus rhythm, recently diagnosed with amiodarone toxicity, now only on metoprolol, will  continue.  He is finishing a course of prednisone.  3. Chronic combined CHF with cardiomyopathy - LV function has declined over the last several years. He's not had any angina. Appears euvolemic on exam.  4. Profound back pain  -differential diagnosis as per neurosurgery as above, currently completely resolved.    5. Sick sinus syndrome, status post pacemaker placement, working normally, followed by Dr. Lovena Le.  Disposition: TEE scheduled for December 15, 2017, follow-up with me in 4 weeks.  Medication Adjustments/Labs and Tests Ordered: Current medicines are reviewed at length with the patient today.  Concerns regarding medicines are outlined above. Medication changes, Labs and Tests ordered today are summarized above and listed in the Patient Instructions accessible in Encounters.   Signed, Ena Dawley, MD  12/02/2017 10:28 PM    [address], [place], [place]  [address] Phone: [phone]; Fax: [phone]

## 2017-12-02 NOTE — Progress Notes (Signed)
HPI [name] returns today for followup of PAF, sinus node dysfunction and SBE. He was diagnosed with enterococcal SBE several weeks ago. At that time he was found to have a vegetation on his aortic valve. No veg seen on his PPM leads. He has undergone several weeks of anti-biotics therapy and feels better. No fever or night sweats by his history. 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 These symptoms have improved with cessation of amiodarone.  Allergies  Allergen Reactions  . Ciprofloxacin Anaphylaxis  . Hydrochlorothiazide Anaphylaxis  . Sulfa Antibiotics Anaphylaxis  . Ace Inhibitors Cough  . Losartan Cough  . Carvedilol Cough    Pt states it "causes him too cough."  . Lisinopril Cough    Pt reports worsening cough and "feeling wheezy."     Current Outpatient Medications  Medication Sig Dispense Refill  . albuterol (PROVENTIL) (2.5 MG/3ML) 0.083% nebulizer solution Take 3 mLs (2.5 mg total) by nebulization every 8 (eight) hours as needed for wheezing or shortness of breath. 50 mL 0  . ampicillin 2 g in sodium chloride 0.9 % 100 mL Inject 2 g into the vein every 6 (six) hours. 228 Dose 0  . apixaban (ELIQUIS) 2.5 MG TABS tablet Take 1 tablet (2.5 mg total) by mouth 2 (two) times daily. 180 tablet 3  . atorvastatin (LIPITOR) 20 MG tablet Take 1 tablet (20 mg total) by mouth daily. 90 tablet 3  . budesonide-formoterol (SYMBICORT) 80-4.5 MCG/ACT inhaler Inhale 2 puffs into the lungs 2 (two) times daily. 1 Inhaler 0  . cefTRIAXone 2 g in sodium chloride 0.9 % 100 mL Inject 2 g into the vein every 12 (twelve) hours. 76 Dose 0  . cholecalciferol (VITAMIN D) 1000 units tablet Take 1,000 Units by mouth 2 (two) times a week.     . Cyanocobalamin (VITAMIN B 12 PO) Take 1 capsule by mouth daily.    . feeding supplement, ENSURE ENLIVE, (ENSURE ENLIVE) LIQD Take 237 mLs by mouth 2 (two) times daily  between meals. 237 mL 12  . latanoprost (XALATAN) 0.005 % ophthalmic solution Place 1 drop into both eyes at bedtime.    Marland Kitchen levothyroxine (SYNTHROID, LEVOTHROID) 25 MCG tablet Take 0.5 tablets (12.5 mcg total) by mouth daily before breakfast. 45 tablet 3  . lidocaine (LIDODERM) 5 % Place 1 patch onto the skin daily. Remove & Discard patch within 12 hours or as directed by MD 30 patch 0  . menthol-cetylpyridinium (CEPACOL) 3 MG lozenge Take 1 lozenge (3 mg total) by mouth as needed for sore throat. 100 tablet 12  . metoprolol tartrate (LOPRESSOR) 50 MG tablet Take 1 tablet (50 mg total) by mouth 2 (two) times daily. 60 tablet 0  . Netarsudil Dimesylate (RHOPRESSA) 0.02 % SOLN Place 1 drop into both eyes at bedtime. Sample given by the physician    . nitroGLYCERIN (NITROSTAT) 0.4 MG SL tablet Place 1 tablet (0.4 mg total) under the tongue every 5 (five) minutes as needed. 25 tablet 3  . oxyCODONE-acetaminophen (PERCOCET/ROXICET) 5-325 MG tablet Take 1 tablet by mouth every 6 (six) hours as needed for moderate pain. 30 tablet 0  . polyethylene glycol (MIRALAX / GLYCOLAX) packet Take 17 g by mouth daily. 14 each 0  . predniSONE (DELTASONE) 10 MG tablet Take 4 tablets (40 mg) daily for the next week, take 3 tablets (30 mg) daily for the  following week, then take 2 tablets (20 mg) daily until follow-up appointment (Patient taking differently: Take 40 mg by mouth daily with breakfast. Take 4 tablets (40 mg) daily for the next week, take 3 tablets (30 mg) daily for the following week, then take 2 tablets (20 mg) daily until follow-up appointment) 80 tablet 1  . senna (SENOKOT) 8.6 MG TABS tablet Take 1 tablet (8.6 mg total) by mouth daily as needed for mild constipation. 120 each 0  . traZODone (DESYREL) 50 MG tablet Take 0.5 tablets (25 mg total) by mouth at bedtime as needed for sleep. 399 tablet 0  . isosorbide mononitrate (IMDUR) 30 MG 24 hr tablet Take 0.5 tablets (15 mg total) by mouth daily. 90 tablet 1     No current facility-administered medications for this visit.      Past Medical History:  Diagnosis Date  . Age-related macular degeneration   . Allergic rhinitis   . Amiodarone toxicity   . Aortic valve stenosis    a. s/p TAVR 06/2016.  . Arthritis    "hands, lower back" (04/30/2016)  . Asthma   . Cardiomyopathy ([place])    a. EF declined to 35-40% by echo 09/2017 (previously 50-55% after TAVR).  . Carotid artery obstruction    a. s/p bilat CEA.  . Chronic combined systolic and diastolic CHF (congestive heart failure) ([place])   . Chronic lower back pain   . Coronary arteriosclerosis in native artery    a. 2017 Cardiac catheterization demonstrated worsening CAD with 70% mid LCx, 80% OM1 and 80% mid RCA stenosis. b.  In 11/17, he underwent successful rotational atherectomy and DES to RCA.  . Diverticulitis of colon   . DJD (degenerative joint disease)   . Dyspnea    "constant but the degree changes"  . GERD (gastroesophageal reflux disease)   . History of elevated PSA 05/2009  . History of hiatal hernia   . HLD (hyperlipidemia)   . Hypertension   . Leukocytosis 10/2017  . Nocturnal hypoxemia   . OSA (obstructive sleep apnea)    intolerant to CPAP  . PAF (paroxysmal atrial fibrillation) ([place])   . Paroxysmal supraventricular tachycardia ([place])   . Pneumonia    "when I was a kid"  . Presence of permanent cardiac pacemaker   . Primary malignant neoplasm of bladder ([place])   . Prostate cancer ([place])     ROS:   All systems reviewed and negative except as noted in the HPI.   Past Surgical History:  Procedure Laterality Date  . APPENDECTOMY    . CARDIAC CATHETERIZATION N/A 01/17/2016   Procedure: Right/Left Heart Cath and Coronary Angiography;  Surgeon: Belva Crome, MD;  Location: [facility];  Service: Cardiovascular;  Laterality: N/A;  . CARDIAC CATHETERIZATION N/A 04/30/2016   Procedure: Coronary/Graft Atherectomy;  Surgeon: Sherren Mocha, MD;  Location: [facility];  Service: Cardiovascular;  Laterality: N/A;  . CAROTID ENDARTERECTOMY Bilateral   . CATARACT EXTRACTION W/ INTRAOCULAR LENS  IMPLANT, BILATERAL Bilateral   . COLON SURGERY  1981   Meckles Diverticulum with volvulus  . EP IMPLANTABLE DEVICE N/A 03/21/2016   Procedure: Pacemaker Implant;  Surgeon: Evans Lance, MD;  Location: [facility];  Service: Cardiovascular;  Laterality: N/A;  . EYE SURGERY    . INGUINAL HERNIA REPAIR Right 2009  . INSERT / REPLACE / REMOVE PACEMAKER    . INSERTION PROSTATE RADIATION SEED    . TEE WITHOUT CARDIOVERSION N/A 06/10/2016  Procedure: TRANSESOPHAGEAL ECHOCARDIOGRAM (TEE);  Surgeon: Sherren Mocha, MD;  Location: [facility];  Service: Open Heart Surgery;  Laterality: N/A;  . TEE WITHOUT CARDIOVERSION N/A 11/10/2017   Procedure: TRANSESOPHAGEAL ECHOCARDIOGRAM (TEE);  Surgeon: Acie Fredrickson Wonda Cheng, MD;  Location: [facility];  Service: Cardiovascular;  Laterality: N/A;  . TONSILLECTOMY  ~ 1947  . TRANSCATHETER AORTIC VALVE REPLACEMENT, TRANSFEMORAL N/A 06/10/2016   Procedure: TRANSCATHETER AORTIC VALVE REPLACEMENT, TRANSFEMORAL;  Surgeon: Sherren Mocha, MD;  Location: [facility];  Service: Open Heart Surgery;  Laterality: N/A;     Family History  Problem Relation Age of Onset  . Heart disease Mother   . Heart disease Father      Social History   Socioeconomic History  . Marital status: Married    Spouse name: Not on file  . Number of children: Not on file  . Years of education: Not on file  . Highest education level: Not on file  Occupational History  . Occupation: retired  Scientific laboratory technician  . Financial resource strain: Not on file  . Food insecurity:    Worry: Not on file    Inability: Not on file  . Transportation needs:    Medical: Not on file    Non-medical: Not on file  Tobacco Use  . Smoking status: Never Smoker  . Smokeless tobacco: Never Used  Substance and Sexual Activity  . Alcohol use: No    Alcohol/week: 0.0 oz    Comment:  04/30/2016 "nothing in years"  . Drug use: No  . Sexual activity: Never  Lifestyle  . Physical activity:    Days per week: Not on file    Minutes per session: Not on file  . Stress: Not on file  Relationships  . Social connections:    Talks on phone: Not on file    Gets together: Not on file    Attends religious service: Not on file    Active member of club or organization: Not on file    Attends meetings of clubs or organizations: Not on file    Relationship status: Not on file  . Intimate partner violence:    Fear of current or ex partner: Not on file    Emotionally abused: Not on file    Physically abused: Not on file    Forced sexual activity: Not on file  Other Topics Concern  . Not on file  Social History Narrative   Retired    Three children - One in [place], One in [place], and one in [place].         BP 140/80   Pulse 69   Ht 5\' 5"  (1.651 m)   Wt 126 lb (57.2 kg)   SpO2 97%   BMI 20.97 kg/m   Physical Exam:  Well appearing elderly man, NAD HEENT: Unremarkable Neck:  7 cm JVD, no thyromegally Lymphatics:  No adenopathy Back:  No CVA tenderness Lungs:  Clear with no wheezes HEART:  Regular rate rhythm, no murmurs, no rubs, no clicks Abd:  soft, positive bowel sounds, no organomegally, no rebound, no guarding Ext:  2 plus pulses, no edema, no cyanosis, no clubbing Skin:  No rashes no nodules Neuro:  CN II through XII intact, motor grossly intact  EKG -  NSR with atrial pacing  DEVICE  Normal device function.  See PaceArt for details.   Assess/Plan: 1. Sinus node dysfunction - he is asymptomatic, s/p PPM insertion.  2. PPM - his medtronic DDD PM is working normally. We will  recheck in several months. 3. PAF - he is maintaining NSR. He will continue his current meds. 4. Endocarditis - he is pending ID service followup. Hopefully the enterococcal infection will clear with meds. Once he is under surveillance if he ha recurrent enterococal infection then  removal of his pPM would be manatory.    Mikle Bosworth.D.

## 2017-12-03 LAB — CBC WITH DIFFERENTIAL/PLATELET
Basophils Absolute: 0 10*3/uL (ref 0.0–0.2)
Basos: 0 %
EOS (ABSOLUTE): 0 10*3/uL (ref 0.0–0.4)
Eos: 0 %
Hematocrit: 43.6 % (ref 37.5–51.0)
Hemoglobin: 13.5 g/dL (ref 13.0–17.7)
Immature Grans (Abs): 0.1 10*3/uL (ref 0.0–0.1)
Immature Granulocytes: 1 %
Lymphocytes Absolute: 0.5 10*3/uL — ABNORMAL LOW (ref 0.7–3.1)
Lymphs: 5 %
MCH: 27.7 pg (ref 26.6–33.0)
MCHC: 31 g/dL — ABNORMAL LOW (ref 31.5–35.7)
MCV: 90 fL (ref 79–97)
Monocytes Absolute: 0.6 10*3/uL (ref 0.1–0.9)
Monocytes: 5 %
Neutrophils Absolute: 9.3 10*3/uL — ABNORMAL HIGH (ref 1.4–7.0)
Neutrophils: 89 %
Platelets: 67 10*3/uL — CL (ref 150–450)
RBC: 4.87 x10E6/uL (ref 4.14–5.80)
RDW: 22.1 % — ABNORMAL HIGH (ref 12.3–15.4)
WBC: 10.5 10*3/uL (ref 3.4–10.8)

## 2017-12-03 LAB — COMPREHENSIVE METABOLIC PANEL
ALT: 22 IU/L (ref 0–44)
AST: 22 IU/L (ref 0–40)
Albumin/Globulin Ratio: 1.7 (ref 1.2–2.2)
Albumin: 3.6 g/dL (ref 3.5–4.7)
Alkaline Phosphatase: 83 IU/L (ref 39–117)
BUN/Creatinine Ratio: 18 (ref 10–24)
BUN: 29 mg/dL — ABNORMAL HIGH (ref 8–27)
Bilirubin Total: 0.3 mg/dL (ref 0.0–1.2)
CO2: 24 mmol/L (ref 20–29)
Calcium: 8.8 mg/dL (ref 8.6–10.2)
Chloride: 102 mmol/L (ref 96–106)
Creatinine, Ser: 1.58 mg/dL — ABNORMAL HIGH (ref 0.76–1.27)
GFR calc Af Amer: 45 mL/min/{1.73_m2} — ABNORMAL LOW (ref >59)
GFR calc non Af Amer: 39 mL/min/{1.73_m2} — ABNORMAL LOW (ref >59)
Globulin, Total: 2.1 g/dL (ref 1.5–4.5)
Glucose: 113 mg/dL — ABNORMAL HIGH (ref 65–99)
Potassium: 4.4 mmol/L (ref 3.5–5.2)
Sodium: 142 mmol/L (ref 134–144)
Total Protein: 5.7 g/dL — ABNORMAL LOW (ref 6.0–8.5)

## 2017-12-03 LAB — TSH: TSH: 2.9 u[IU]/mL (ref 0.450–4.500)

## 2017-12-03 LAB — C-REACTIVE PROTEIN: CRP: 13 mg/L — ABNORMAL HIGH (ref 0–10)

## 2017-12-07 ENCOUNTER — Telehealth: Payer: Self-pay | Admitting: Cardiology

## 2017-12-07 DIAGNOSIS — M79604 Pain in right leg: Secondary | ICD-10-CM | POA: Diagnosis not present

## 2017-12-07 DIAGNOSIS — I330 Acute and subacute infective endocarditis: Secondary | ICD-10-CM | POA: Diagnosis not present

## 2017-12-07 DIAGNOSIS — I739 Peripheral vascular disease, unspecified: Secondary | ICD-10-CM | POA: Diagnosis not present

## 2017-12-07 NOTE — Telephone Encounter (Signed)
Patient's spouse calling in very concerned with medical recommendation that was given while pt is in rehab.(dpr on file) She states yesterday he had pain in his right leg, pt had no swelling, redness or warmth. doppler was ordered. Today she states he denies any pain and now has right foot numbness, she states the doppler was completed today and she will have results faxed to office. She states they recommend increasing his Eliquis and she will not agree with plan until she speaks with Dr. Meda Coffee first. I informed her that Dr. Meda Coffee is not in the office, I will forward to Dr. Meda Coffee and give her a call back with any additional recommendations. She stated understanding and thankful for the call

## 2017-12-07 NOTE — Telephone Encounter (Signed)
New Message:       Pt's wife is calling and states he is at the rehab center and they want to increase the pt's Eliquis due to thinking he may have a embolism.

## 2017-12-07 NOTE — Telephone Encounter (Signed)
I cant see Doppler results, I cant comment on this without seeing it.

## 2017-12-08 NOTE — Telephone Encounter (Signed)
Pts wife states that the doppler was resulted by the facility and showed no acute blockage.  Wife states that they are having this report faxed off to our office today, to have scanned into the pts chart for Dr Meda Coffee to review.

## 2017-12-09 ENCOUNTER — Ambulatory Visit: Payer: Medicare Other | Admitting: Internal Medicine

## 2017-12-09 ENCOUNTER — Other Ambulatory Visit: Payer: Self-pay

## 2017-12-09 ENCOUNTER — Encounter: Payer: Self-pay | Admitting: Internal Medicine

## 2017-12-09 VITALS — BP 126/54 | HR 72 | Wt 128.8 lb

## 2017-12-09 DIAGNOSIS — D696 Thrombocytopenia, unspecified: Secondary | ICD-10-CM | POA: Diagnosis not present

## 2017-12-09 DIAGNOSIS — R7881 Bacteremia: Secondary | ICD-10-CM | POA: Diagnosis not present

## 2017-12-09 DIAGNOSIS — T826XXD Infection and inflammatory reaction due to cardiac valve prosthesis, subsequent encounter: Secondary | ICD-10-CM | POA: Diagnosis not present

## 2017-12-09 DIAGNOSIS — N289 Disorder of kidney and ureter, unspecified: Secondary | ICD-10-CM | POA: Diagnosis not present

## 2017-12-09 DIAGNOSIS — B952 Enterococcus as the cause of diseases classified elsewhere: Secondary | ICD-10-CM

## 2017-12-09 DIAGNOSIS — I38 Endocarditis, valve unspecified: Secondary | ICD-10-CM

## 2017-12-09 NOTE — Assessment & Plan Note (Signed)
Continuing on treatment and doing well tolerating it.  Plan to continue through July 23rd.  TEE on 7/16.

## 2017-12-09 NOTE — Assessment & Plan Note (Signed)
Getting treatment as above and hopefully can continue.  Alternative treatment would be daptomycin.

## 2017-12-09 NOTE — Assessment & Plan Note (Signed)
Some at baseline though has decreased, some increased bruising.  Getting weekly and hopefully will stabilize.

## 2017-12-09 NOTE — Assessment & Plan Note (Signed)
His creat has increased a bit.  Can be side effect from ampicillin. Getting weekly labs and will monitor

## 2017-12-09 NOTE — Progress Notes (Signed)
   Subjective:    Patient ID: Ryan [name], male    DOB: 09-04-1929, 84 y.o.   MRN: [id number]  HPI He is here for hsfu. 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  Two vegetations noted on the aortic valve.  He has been on dual beta lactam therapy with ampcillin and ceftriaxone and no new issues.  Stop date is preliminarily projected to be 7/23.  He has a TEE scheduled for 7/16.   He is anxious to be done and get more active again.  He has had a recent cocncern in his right leg and a doppler noted some femoral stenosis but no blockage.  Some back pain again.  No fever, no chills.  No associated rash or diarrhea.     Review of Systems  Constitutional: Positive for activity change. Negative for fatigue.  Gastrointestinal: Negative for diarrhea and nausea.       Objective:   Physical Exam  Constitutional: He appears well-developed and well-nourished. No distress.  HENT:  Mouth/Throat: No oropharyngeal exudate.  Eyes: No scleral icterus.  Cardiovascular: Normal rate, regular rhythm and normal heart sounds.  No murmur heard. Pulmonary/Chest: Effort normal and breath sounds normal. No respiratory distress.  Musculoskeletal:  Right leg with bruising, bandage  Skin: No rash noted.   SH: no tobacco       Assessment & Plan:

## 2017-12-09 NOTE — Telephone Encounter (Signed)
Doppler report received from outside provider, and scanned into the pts chart and sent to Dr Francesca Oman in-basket for further review.

## 2017-12-10 ENCOUNTER — Telehealth: Payer: Self-pay | Admitting: *Deleted

## 2017-12-10 NOTE — Telephone Encounter (Signed)
Spoke with the pts Wife Ryan [name] (on [place] and takes results for pt for he is in rehab at this time), and informed her that Dr Meda Coffee reviewed the pts right lower extremity doppler, and recommends that based on these results we refer him to one of our PV Providers (Dr Myrene Galas), for abnormal study.  Informed the pts wife that I will send a message to our [facility] schedulers to call her back to arrange referral to Dr [place] Anon or Dr Gwenlyn Found, for [facility] consult appt for the pt. Wife verbalized understanding and agrees with this plan.

## 2017-12-10 NOTE — Telephone Encounter (Signed)
-----   Message from Elberta Fortis sent at 12/10/2017  9:22 AM EDT -----   ----- Message ----- From: Dorothy Spark, MD Sent: 12/10/2017   9:11 AM To: Elberta Fortis  Can you please refer him to our vascular cardiologists or  Vascular surgery?   ----- Message ----- From: Elberta Fortis Sent: 12/09/2017  12:29 PM To: Dorothy Spark, MD

## 2017-12-14 ENCOUNTER — Telehealth: Payer: Self-pay | Admitting: *Deleted

## 2017-12-14 NOTE — Telephone Encounter (Signed)
-----   Message from Howie Ill sent at 12/14/2017  8:40 AM EDT ----- Regarding: RE: refer to [facility] or Gwenlyn Found for [place] per Dr Meda Coffee. Ryan [name]   We need a referral put in for a PV consult for [facility].  Per the instructions that Crystal gave [place] we have to have a referral in the system for all internal referrals.  Thank  Romie Minus ----- Message ----- From: Nuala Alpha, LPN Sent: 2/57/5051   8:18 AM To: [facility] Subject: FW: refer to [facility] or Gwenlyn Found for #    ----- Message ----- From: Nuala Alpha, LPN Sent: 8/33/5825   5:55 PM To: [facility] Subject: FW: refer to [facility] or Gwenlyn Found for #    ----- Message ----- From: Nuala Alpha, LPN Sent: 1/89/8421  10:07 AM To: [facility] Subject: refer to [facility] or Gwenlyn Found for [facility] C#  Per Dr Meda Coffee, this pt needs to be referred to Dr [place] Anon or Gwenlyn Found for abnormal lower extremity duplex (right) that was done by the pts SNF and scanned to Dr Meda Coffee to review.  Dr Meda Coffee read as abnormal and needs a PV consult appt.  Wife is the one to call, for pt is in rehab at [facility].  Her name is Ryan [name] and on [place].  You could also try daughter too.  Wife is aware you will be calling to arrange.  Can you please call?    Thanks for all you do,  Ryan [name]

## 2017-12-15 ENCOUNTER — Encounter: Payer: Self-pay | Admitting: Internal Medicine

## 2017-12-15 ENCOUNTER — Encounter (HOSPITAL_COMMUNITY): Payer: Self-pay | Admitting: *Deleted

## 2017-12-15 ENCOUNTER — Ambulatory Visit (HOSPITAL_COMMUNITY)
Admission: RE | Admit: 2017-12-15 | Discharge: 2017-12-15 | Disposition: A | Discharge status: Home / Self Care | Payer: Medicare Other | Source: Ambulatory Visit | Attending: Cardiology | Admitting: Cardiology

## 2017-12-15 ENCOUNTER — Ambulatory Visit (HOSPITAL_BASED_OUTPATIENT_CLINIC_OR_DEPARTMENT_OTHER): Payer: Medicare Other

## 2017-12-15 ENCOUNTER — Encounter (HOSPITAL_COMMUNITY)
Admission: RE | Disposition: A | Discharge status: Home / Self Care | Payer: Self-pay | Source: Ambulatory Visit | Attending: Cardiology

## 2017-12-15 DIAGNOSIS — H3530 Unspecified macular degeneration: Secondary | ICD-10-CM | POA: Diagnosis not present

## 2017-12-15 DIAGNOSIS — G8929 Other chronic pain: Secondary | ICD-10-CM | POA: Insufficient documentation

## 2017-12-15 DIAGNOSIS — I471 Supraventricular tachycardia: Secondary | ICD-10-CM | POA: Insufficient documentation

## 2017-12-15 DIAGNOSIS — I330 Acute and subacute infective endocarditis: Secondary | ICD-10-CM | POA: Diagnosis not present

## 2017-12-15 DIAGNOSIS — I429 Cardiomyopathy, unspecified: Secondary | ICD-10-CM | POA: Diagnosis not present

## 2017-12-15 DIAGNOSIS — K219 Gastro-esophageal reflux disease without esophagitis: Secondary | ICD-10-CM | POA: Insufficient documentation

## 2017-12-15 DIAGNOSIS — Z950 Presence of cardiac pacemaker: Secondary | ICD-10-CM | POA: Diagnosis not present

## 2017-12-15 DIAGNOSIS — Z952 Presence of prosthetic heart valve: Secondary | ICD-10-CM

## 2017-12-15 DIAGNOSIS — I480 Paroxysmal atrial fibrillation: Secondary | ICD-10-CM | POA: Insufficient documentation

## 2017-12-15 DIAGNOSIS — M545 Low back pain: Secondary | ICD-10-CM | POA: Insufficient documentation

## 2017-12-15 DIAGNOSIS — G4733 Obstructive sleep apnea (adult) (pediatric): Secondary | ICD-10-CM | POA: Diagnosis not present

## 2017-12-15 DIAGNOSIS — Z7951 Long term (current) use of inhaled steroids: Secondary | ICD-10-CM | POA: Diagnosis not present

## 2017-12-15 DIAGNOSIS — I110 Hypertensive heart disease with heart failure: Secondary | ICD-10-CM | POA: Diagnosis not present

## 2017-12-15 DIAGNOSIS — I495 Sick sinus syndrome: Secondary | ICD-10-CM | POA: Diagnosis not present

## 2017-12-15 DIAGNOSIS — I5042 Chronic combined systolic (congestive) and diastolic (congestive) heart failure: Secondary | ICD-10-CM | POA: Insufficient documentation

## 2017-12-15 DIAGNOSIS — Z882 Allergy status to sulfonamides status: Secondary | ICD-10-CM | POA: Diagnosis not present

## 2017-12-15 DIAGNOSIS — I2510 Atherosclerotic heart disease of native coronary artery without angina pectoris: Secondary | ICD-10-CM | POA: Insufficient documentation

## 2017-12-15 DIAGNOSIS — Z7901 Long term (current) use of anticoagulants: Secondary | ICD-10-CM | POA: Diagnosis not present

## 2017-12-15 DIAGNOSIS — J45909 Unspecified asthma, uncomplicated: Secondary | ICD-10-CM | POA: Diagnosis not present

## 2017-12-15 DIAGNOSIS — T826XXD Infection and inflammatory reaction due to cardiac valve prosthesis, subsequent encounter: Secondary | ICD-10-CM

## 2017-12-15 DIAGNOSIS — I38 Endocarditis, valve unspecified: Secondary | ICD-10-CM

## 2017-12-15 DIAGNOSIS — E785 Hyperlipidemia, unspecified: Secondary | ICD-10-CM | POA: Insufficient documentation

## 2017-12-15 HISTORY — PX: TEE WITHOUT CARDIOVERSION: SHX5443

## 2017-12-15 LAB — CUP PACEART INCLINIC DEVICE CHECK
Battery Remaining Longevity: 132 mo
Brady Statistic AP VP Percent: 0.2 %
Brady Statistic AS VP Percent: 0.1 % — CL
Brady Statistic AS VS Percent: 18.9 %
Date Time Interrogation Session: 20190716150729
Implantable Lead Implant Date: 20171020
Implantable Lead Location: 753860
Implantable Pulse Generator Implant Date: 20171020
Lead Channel Impedance Value: 562 Ohm
Lead Channel Pacing Threshold Amplitude: 0.5 V
Lead Channel Pacing Threshold Pulse Width: 0.4 ms
Lead Channel Sensing Intrinsic Amplitude: 15.68 mV
Lead Channel Sensing Intrinsic Amplitude: 4 mV
Lead Channel Setting Pacing Amplitude: 2 V
Lead Channel Setting Pacing Pulse Width: 0.4 ms
MDC IDC LEAD IMPLANT DT: 20171020
MDC IDC LEAD LOCATION: 753859
MDC IDC MSMT BATTERY IMPEDANCE: 137 Ohm
MDC IDC MSMT BATTERY VOLTAGE: 2.79 V
MDC IDC MSMT LEADCHNL RA IMPEDANCE VALUE: 417 Ohm
MDC IDC MSMT LEADCHNL RV PACING THRESHOLD AMPLITUDE: 0.75 V
MDC IDC MSMT LEADCHNL RV PACING THRESHOLD PULSEWIDTH: 0.4 ms
MDC IDC SET LEADCHNL RV PACING AMPLITUDE: 2.5 V
MDC IDC SET LEADCHNL RV SENSING SENSITIVITY: 4 mV
MDC IDC STAT BRADY AP VS PERCENT: 80.9 %

## 2017-12-15 SURGERY — ECHOCARDIOGRAM, TRANSESOPHAGEAL
Anesthesia: Moderate Sedation

## 2017-12-15 MED ORDER — MIDAZOLAM HCL 10 MG/2ML IJ SOLN
INTRAMUSCULAR | Status: DC | PRN
  Administered 2017-12-15: 2 mg via INTRAVENOUS
  Administered 2017-12-15: 1 mg via INTRAVENOUS

## 2017-12-15 MED ORDER — HYDRALAZINE HCL 20 MG/ML IJ SOLN
INTRAMUSCULAR | Status: AC
  Filled 2017-12-15: qty 1

## 2017-12-15 MED ORDER — MIDAZOLAM HCL 5 MG/ML IJ SOLN
INTRAMUSCULAR | Status: AC
  Filled 2017-12-15: qty 2

## 2017-12-15 MED ORDER — HYDRALAZINE HCL 20 MG/ML IJ SOLN
INTRAMUSCULAR | Status: DC | PRN
  Administered 2017-12-15: 10 mg via INTRAVENOUS

## 2017-12-15 MED ORDER — SODIUM CHLORIDE 0.9 % IV SOLN
INTRAVENOUS | Status: AC | PRN
  Administered 2017-12-15: 500 mL via INTRAVENOUS

## 2017-12-15 MED ORDER — SODIUM CHLORIDE 0.9 % IV SOLN: INTRAVENOUS | Status: DC

## 2017-12-15 MED ORDER — FENTANYL CITRATE (PF) 100 MCG/2ML IJ SOLN
INTRAMUSCULAR | Status: DC | PRN
  Administered 2017-12-15: 25 ug via INTRAVENOUS

## 2017-12-15 MED ORDER — FENTANYL CITRATE (PF) 100 MCG/2ML IJ SOLN
INTRAMUSCULAR | Status: AC
Start: 2017-12-15 — End: ?
  Filled 2017-12-15: qty 2

## 2017-12-15 NOTE — CV Procedure (Addendum)
Brief TEE report, full report to follow in [place]:  No vegetations visualized on either side of aortic valve. No significant regurgitation, leaflet excursion normal.  Pacer wires interrogation, very minimal fibrinous stranding attached, no vegetations seen.  Patient tolerated procedure well.  During this procedure the patient is administered a total of Versed 3 mg and Fentanyl 25 mcg to achieve and maintain moderate conscious sedation.  The patient's heart rate, blood pressure, and oxygen saturation are monitored continuously during the procedure. The period of conscious sedation is 30 minutes, of which I was present face-to-face 100% of this time.  10 mg of IV hydralazine given for blood pressure control with satisfactory result.  Buford Dresser, MD  8:40 AM

## 2017-12-15 NOTE — Progress Notes (Signed)
  Echocardiogram Echocardiogram Transesophageal has been performed.  Ryan [name] [name] 12/15/2017, 9:00 AM

## 2017-12-15 NOTE — Interval H&P Note (Signed)
History and Physical Interval Note:  12/15/2017 8:04 AM  Ryan [name]  has presented today for surgery, with the diagnosis of STATIS POST AVR, ENDOCARDITIS, CHEST PAIN  The various methods of treatment have been discussed with the patient and family. After consideration of risks, benefits and other options for treatment, the patient has consented to  Procedure(s): TRANSESOPHAGEAL ECHOCARDIOGRAM (TEE) (N/A) as a surgical intervention .  The patient's history has been reviewed, patient examined, no change in status, stable for surgery.  I have reviewed the patient's chart and labs.  Questions were answered to the patient's satisfaction.     Alma Muegge Harrell Gave

## 2017-12-15 NOTE — Discharge Instructions (Signed)
Transesophageal Echocardiogram °Transesophageal echocardiography (TEE) is a picture test of your heart using sound waves. The pictures taken can give very detailed pictures of your heart. This can help your doctor see if there are problems with your heart. TEE can check: °· If your heart has blood clots in it. °· How well your heart valves are working. °· If you have an infection on the inside of your heart. °· Some of the major arteries of your heart. °· If your heart valve is working after a repair. °· Your heart before a procedure that uses a shock to your heart to get the rhythm back to normal. ° °What happens before the procedure? °· Do not eat or drink for 6 hours before the procedure or as told by your doctor. °· Make plans to have someone drive you home after the procedure. Do not drive yourself home. °· An IV tube will be put in your arm. °What happens during the procedure? °· You will be given a medicine to help you relax (sedative). It will be given through the IV tube. °· A numbing medicine will be sprayed or gargled in the back of your throat to help numb it. °· The tip of the probe is placed into the back of your mouth. You will be asked to swallow. This helps to pass the probe into your esophagus. °· Once the tip of the probe is in the right place, your doctor can take pictures of your heart. °· You may feel pressure at the back of your throat. °What happens after the procedure? °· You will be taken to a recovery area so the sedative can wear off. °· Your throat may be sore and scratchy. This will go away slowly over time. °· You will go home when you are fully awake and able to swallow liquids. °· You should have someone stay with you for the next 24 hours. °· Do not drive or operate machinery for the next 24 hours. °This information is not intended to replace advice given to you by your health care provider. Make sure you discuss any questions you have with your health care provider. °Document  Released: 03/16/2009 Document Revised: 10/25/2015 Document Reviewed: 11/18/2012 °Elsevier Interactive Patient Education © 2018 Elsevier Inc. ° °

## 2017-12-18 ENCOUNTER — Telehealth: Payer: Self-pay | Admitting: *Deleted

## 2017-12-18 ENCOUNTER — Other Ambulatory Visit: Payer: Self-pay | Admitting: Pharmacist

## 2017-12-18 NOTE — Telephone Encounter (Signed)
-----   Message from Thayer Headings, MD sent at 12/18/2017  2:26 PM EDT ----- Let him know that I reviewed his recent TEE and the endocarditis appears cleared up/no longer visible so we will proceed with the plan to complete the antibiotics on July 23rd, remove the picc line then and I would like to do repeat blood cultures on about July 29 or 30 with 2 sets.  thanks

## 2017-12-18 NOTE — Telephone Encounter (Signed)
RN unable to contact [facility] pharmacy or patient at phone numbers in chart. Will attempt again Monday 7/22. Landis Gandy, RN

## 2017-12-18 NOTE — Progress Notes (Signed)
[facility] pharmacy lab review  

## 2017-12-21 ENCOUNTER — Telehealth: Payer: Self-pay

## 2017-12-21 NOTE — Telephone Encounter (Signed)
Was able to fax order to [facility] with assistance from RN. Orders were faxed to [place], [place]

## 2017-12-21 NOTE — Telephone Encounter (Signed)
Received a call today from [facility] to confirm end date of pt's antibiotics per last message between RN and MD pt's end date is July 23rd. I was able to inform RN at [facility] with this and also informed RN that Md would like to do repeat cultures with two sets on July 29/30. RN stated pt will be discharged from facility on July 24th. Rn also stated that orders will have to be faxed to office for pt's picc line to be pulled. Will route message to MD to inform him of orders needed and when pt will be discharged.   Orders can be faxed to Lincoln Brigham, RN at [phone]

## 2017-12-21 NOTE — Telephone Encounter (Signed)
-----   Message from Thayer Headings, MD sent at 12/18/2017  2:26 PM EDT ----- Let him know that I reviewed his recent TEE and the endocarditis appears cleared up/no longer visible so we will proceed with the plan to complete the antibiotics on July 23rd, remove the picc line then and I would like to do repeat blood cultures on about July 29 or 30 with 2 sets.  thanks

## 2017-12-21 NOTE — Telephone Encounter (Signed)
Mandan, thanks.  If you could have someone sign that.  Just for clarification, it is two sets on just one day, not both days.  Rob

## 2017-12-22 ENCOUNTER — Encounter: Payer: Self-pay | Admitting: Internal Medicine

## 2017-12-23 ENCOUNTER — Other Ambulatory Visit: Payer: Self-pay | Admitting: Pharmacist

## 2017-12-23 ENCOUNTER — Telehealth: Payer: Self-pay | Admitting: Family Medicine

## 2017-12-23 ENCOUNTER — Other Ambulatory Visit: Payer: Self-pay | Admitting: Adult Health

## 2017-12-23 MED ORDER — NYSTATIN 100000 UNIT/GM EX CREA: 1.0000 "application " | TOPICAL_CREAM | Freq: Two times a day (BID) | CUTANEOUS | 3 refills | Status: DC

## 2017-12-23 NOTE — Telephone Encounter (Signed)
Pt notified that Ryan [name] will send in rx for Nystatin cream and he is agreeable to trying this.  No further action required.

## 2017-12-23 NOTE — Telephone Encounter (Signed)
Copied from [place] [phone]. Topic: Appointment Scheduling - Scheduling Inquiry for Clinic >> Dec 23, 2017  1:41 PM Synthia Innocent wrote: Reason for CRM: Patient requesting to be seen this week for hospital follow up with [facility]. Able to work in? Also has a rash in groin area, meds given in rehab are not working. Please advise

## 2017-12-23 NOTE — Telephone Encounter (Signed)
Spoke to the pt about his rash.  Pt advised he was given Nyamyc (nystatin powder) 100,000 units per gram to apply 3-4 times daily.  Pt states the rash is on his groin and partially goes down his thighs.  This medication is not helping the itching.  He is requesting a new medication be sent to the pharmacy.  Pt is not opposed to seeing another physician tomorrow if necessary.  Please advise.  Pt also notified that [facility] does not have any openings this week for hospital follow up.

## 2017-12-23 NOTE — Telephone Encounter (Signed)
I am ok with trying an nystain cream to see if it works any better. He can apply this twice a day

## 2017-12-23 NOTE — Progress Notes (Signed)
[facility] pharmacy lab review  

## 2017-12-25 ENCOUNTER — Telehealth: Payer: Self-pay | Admitting: Adult Health

## 2017-12-25 NOTE — Telephone Encounter (Signed)
Copied from [place] [phone]. Topic: Inquiry >> Dec 25, 2017 11:53 AM Lennox Solders wrote: Reason for CRM: sherry rn  [facility] is calling to notify the start of care will be delay more than 48 hrs per family request. Judeen Hammans will go out on 12-29-17. Pt was discharge from [facility] rehab on 12-23-17. [name] ordered skill nursing for medication and disease management, PT , OT for evaluations and treat  they will work on gait balance and training and also home health aid to assist with bathing. Pt has not seen cory yet

## 2017-12-26 DIAGNOSIS — J984 Other disorders of lung: Secondary | ICD-10-CM | POA: Diagnosis not present

## 2017-12-26 DIAGNOSIS — R55 Syncope and collapse: Secondary | ICD-10-CM | POA: Diagnosis not present

## 2017-12-26 DIAGNOSIS — I5032 Chronic diastolic (congestive) heart failure: Secondary | ICD-10-CM | POA: Diagnosis not present

## 2017-12-28 ENCOUNTER — Other Ambulatory Visit: Payer: Medicare Other

## 2017-12-28 DIAGNOSIS — T826XXD Infection and inflammatory reaction due to cardiac valve prosthesis, subsequent encounter: Secondary | ICD-10-CM | POA: Diagnosis not present

## 2017-12-28 DIAGNOSIS — I38 Endocarditis, valve unspecified: Secondary | ICD-10-CM | POA: Diagnosis not present

## 2017-12-29 ENCOUNTER — Telehealth: Payer: Self-pay | Admitting: Family Medicine

## 2017-12-29 NOTE — Telephone Encounter (Signed)
Copied from [place] [phone]. Topic: General - Other >> Dec 29, 2017 10:26 AM Judyann Munson wrote: Reason for CRM:  Darlina Guys- [facility] calling in to regards Home health care will start 12-31-17

## 2017-12-29 NOTE — Telephone Encounter (Signed)
Noted  

## 2017-12-30 ENCOUNTER — Encounter: Payer: Self-pay | Admitting: Adult Health

## 2017-12-30 ENCOUNTER — Ambulatory Visit: Payer: Medicare Other | Admitting: Adult Health

## 2017-12-30 VITALS — BP 142/60 | Temp 97.7°F | Wt 126.0 lb

## 2017-12-30 DIAGNOSIS — B356 Tinea cruris: Secondary | ICD-10-CM

## 2017-12-30 DIAGNOSIS — R7881 Bacteremia: Secondary | ICD-10-CM | POA: Diagnosis not present

## 2017-12-30 DIAGNOSIS — T826XXD Infection and inflammatory reaction due to cardiac valve prosthesis, subsequent encounter: Secondary | ICD-10-CM

## 2017-12-30 DIAGNOSIS — M549 Dorsalgia, unspecified: Secondary | ICD-10-CM

## 2017-12-30 DIAGNOSIS — B952 Enterococcus as the cause of diseases classified elsewhere: Secondary | ICD-10-CM

## 2017-12-30 DIAGNOSIS — I38 Endocarditis, valve unspecified: Secondary | ICD-10-CM | POA: Diagnosis not present

## 2017-12-30 DIAGNOSIS — R2681 Unsteadiness on feet: Secondary | ICD-10-CM

## 2017-12-30 LAB — BASIC METABOLIC PANEL
BUN: 51 mg/dL — AB (ref 6–23)
CALCIUM: 9 mg/dL (ref 8.4–10.5)
CHLORIDE: 104 meq/L (ref 96–112)
CO2: 30 mEq/L (ref 19–32)
CREATININE: 2.65 mg/dL — AB (ref 0.40–1.50)
GFR: 24.48 mL/min — ABNORMAL LOW (ref >60.00)
Glucose, Bld: 114 mg/dL — ABNORMAL HIGH (ref 70–99)
Potassium: 4.8 mEq/L (ref 3.5–5.1)
Sodium: 142 mEq/L (ref 135–145)

## 2017-12-30 LAB — CBC WITH DIFFERENTIAL/PLATELET
BASOS ABS: 0.1 10*3/uL (ref 0.0–0.1)
BASOS PCT: 1.4 % (ref 0.0–3.0)
EOS PCT: 0.9 % (ref 0.0–5.0)
Eosinophils Absolute: 0.1 10*3/uL (ref 0.0–0.7)
HEMATOCRIT: 37 % — AB (ref 39.0–52.0)
Hemoglobin: 12.1 g/dL — ABNORMAL LOW (ref 13.0–17.0)
LYMPHS ABS: 0.9 10*3/uL (ref 0.7–4.0)
LYMPHS PCT: 14.9 % (ref 12.0–46.0)
MCHC: 32.7 g/dL (ref 30.0–36.0)
MCV: 89.9 fl (ref 78.0–100.0)
MONOS PCT: 14.6 % — AB (ref 3.0–12.0)
Monocytes Absolute: 0.9 10*3/uL (ref 0.1–1.0)
NEUTROS PCT: 68.2 % (ref 43.0–77.0)
Neutro Abs: 4.1 10*3/uL (ref 1.4–7.7)
PLATELETS: 192 10*3/uL (ref 150.0–400.0)
RBC: 4.11 Mil/uL — ABNORMAL LOW (ref 4.22–5.81)
RDW: 22.1 % — AB (ref 11.5–15.5)
WBC: 6 10*3/uL (ref 4.0–10.5)

## 2017-12-30 MED ORDER — TRIAMCINOLONE ACETONIDE 0.5 % EX OINT: 1.0000 "application " | TOPICAL_OINTMENT | Freq: Two times a day (BID) | CUTANEOUS | 1 refills | Status: DC

## 2017-12-30 MED ORDER — LIDOCAINE 5 % EX PTCH: 1.0000 | MEDICATED_PATCH | CUTANEOUS | 3 refills | Status: DC

## 2017-12-30 NOTE — Progress Notes (Addendum)
Subjective:    Patient ID: Ryan [name], male    DOB: 1929/09/23, 84 y.o.   MRN: [id number]  HPI  84 year old male who  has a past medical history of Age-related macular degeneration, Allergic rhinitis, Amiodarone toxicity, Aortic valve stenosis, Arthritis, Asthma, Cardiomyopathy ([place]), Carotid artery obstruction, Chronic combined systolic and diastolic CHF (congestive heart failure) (HCC), Chronic lower back pain, Coronary arteriosclerosis in native artery, Diverticulitis of colon, DJD (degenerative joint disease), Dyspnea, GERD (gastroesophageal reflux disease), History of elevated PSA (05/2009), History of hiatal hernia, HLD (hyperlipidemia), Hypertension, Leukocytosis (10/2017), Nocturnal hypoxemia, OSA (obstructive sleep apnea), PAF (paroxysmal atrial fibrillation) ([place]), Paroxysmal supraventricular tachycardia ([place]), Pneumonia, Presence of permanent cardiac pacemaker, Primary malignant neoplasm of bladder ([place]), and Prostate cancer ([place]).  He presents to the office today for TCM visit   Admit Date: 11/07/2017  Discharge Date 11/14/2017   He presented to the emergency room on 11/07/2017 with progressive left lower back pain for 3 days duration that was not relieved by pain medications.  Previous to that he was 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  He was improving until about 3 days prior to admission when he developed a worsening left lower back pain and extreme fatigue.  He was admitted on November 07, 2017 and diagnosed with enterococcal bacteremia with prosthetic valve endocarditis.  TEE showed large vegetation on the supravalvular aspect of the valve and a small vegetation on the subvalvular aspect of the valve.  He was placed on IV antibiotics of ampicillin and Rocephin for the endocarditis and was discharged to a skilled nursing facility to complete course of antibiotics that were currently ongoing until December 22, 2017 when he was discharged from SNF   Today in the office he reports that overall he is feeling improved and feels as though he has more energy, continues to feel fatigued during the middle of the day.  He denies any fevers or chills, dizziness or syncopal episodes.  His appetite is good and he is as though he is staying hydrated throughout the day.  He is doing his home exercises daily.  Denies any chest pain but does have very intermittent episodes of shortness of breath especially while doing his home exercises  He is using a rolling walker with seat for ambulation but continues to feel unsteady on his feet since being discharged from skilled nursing facility.  Has follow-up appointments cardiology and electrophysiology this month.  While in the skilled nursing facility he evidently got a fungal infection in his groin that has not resolved with topical treatments, he was using a nystatin powder and then was switched to nystatin cream.  He continues to have this rash his groin bilaterally.  He would also like a refill of lidocaine patches  Review of Systems  Constitutional: Positive for fatigue. Negative for appetite change, chills, diaphoresis and fever.  Respiratory: Positive for shortness of breath. Negative for chest tightness.   Cardiovascular: Negative.   Gastrointestinal: Negative.   Genitourinary: Negative.   Musculoskeletal: Positive for arthralgias.  Skin: Positive for rash.  Neurological: Negative.   Psychiatric/Behavioral: Negative.    Past Medical History:  Diagnosis Date  . Age-related macular degeneration   . Allergic rhinitis   . Amiodarone toxicity   . Aortic valve stenosis    a. s/p TAVR 06/2016.  . Arthritis    "hands, lower back" (04/30/2016)  . Asthma   . Cardiomyopathy ([place])  a. EF declined to 35-40% by echo 09/2017 (previously 50-55% after TAVR).  . Carotid artery obstruction    a. s/p bilat CEA.  . Chronic combined systolic and diastolic CHF  (congestive heart failure) ([place])   . Chronic lower back pain   . Coronary arteriosclerosis in native artery    a. 2017 Cardiac catheterization demonstrated worsening CAD with 70% mid LCx, 80% OM1 and 80% mid RCA stenosis. b.  In 11/17, he underwent successful rotational atherectomy and DES to RCA.  . Diverticulitis of colon   . DJD (degenerative joint disease)   . Dyspnea    "constant but the degree changes"  . GERD (gastroesophageal reflux disease)   . History of elevated PSA 05/2009  . History of hiatal hernia   . HLD (hyperlipidemia)   . Hypertension   . Leukocytosis 10/2017  . Nocturnal hypoxemia   . OSA (obstructive sleep apnea)    intolerant to CPAP  . PAF (paroxysmal atrial fibrillation) ([place])   . Paroxysmal supraventricular tachycardia ([place])   . Pneumonia    "when I was a kid"  . Presence of permanent cardiac pacemaker   . Primary malignant neoplasm of bladder ([place])   . Prostate cancer [facility])     Social History   Socioeconomic History  . Marital status: Married    Spouse name: Not on file  . Number of children: Not on file  . Years of education: Not on file  . Highest education level: Not on file  Occupational History  . Occupation: retired  Scientific laboratory technician  . Financial resource strain: Not on file  . Food insecurity:    Worry: Not on file    Inability: Not on file  . Transportation needs:    Medical: Not on file    Non-medical: Not on file  Tobacco Use  . Smoking status: Never Smoker  . Smokeless tobacco: Never Used  Substance and Sexual Activity  . Alcohol use: No    Alcohol/week: 0.0 oz    Comment: 04/30/2016 "nothing in years"  . Drug use: No  . Sexual activity: Never  Lifestyle  . Physical activity:    Days per week: Not on file    Minutes per session: Not on file  . Stress: Not on file  Relationships  . Social connections:    Talks on phone: Not on file    Gets together: Not on file    Attends religious service: Not on file    Active member of  club or organization: Not on file    Attends meetings of clubs or organizations: Not on file    Relationship status: Not on file  . Intimate partner violence:    Fear of current or ex partner: Not on file    Emotionally abused: Not on file    Physically abused: Not on file    Forced sexual activity: Not on file  Other Topics Concern  . Not on file  Social History Narrative   Retired    Three children - One in [place], One in [place], and one in [place].        Past Surgical History:  Procedure Laterality Date  . APPENDECTOMY    . CARDIAC CATHETERIZATION N/A 01/17/2016   Procedure: Right/Left Heart Cath and Coronary Angiography;  Surgeon: Belva Crome, MD;  Location: [facility];  Service: Cardiovascular;  Laterality: N/A;  . CARDIAC CATHETERIZATION N/A 04/30/2016   Procedure: Coronary/Graft Atherectomy;  Surgeon: Sherren Mocha, MD;  Location: [facility]  [facility];  Service: Cardiovascular;  Laterality: N/A;  . CAROTID ENDARTERECTOMY Bilateral   . CATARACT EXTRACTION W/ INTRAOCULAR LENS  IMPLANT, BILATERAL Bilateral   . COLON SURGERY  1981   Meckles Diverticulum with volvulus  . EP IMPLANTABLE DEVICE N/A 03/21/2016   Procedure: Pacemaker Implant;  Surgeon: Evans Lance, MD;  Location: [facility];  Service: Cardiovascular;  Laterality: N/A;  . EYE SURGERY    . INGUINAL HERNIA REPAIR Right 2009  . INSERT / REPLACE / REMOVE PACEMAKER    . INSERTION PROSTATE RADIATION SEED    . TEE WITHOUT CARDIOVERSION N/A 06/10/2016   Procedure: TRANSESOPHAGEAL ECHOCARDIOGRAM (TEE);  Surgeon: Sherren Mocha, MD;  Location: [facility];  Service: Open Heart Surgery;  Laterality: N/A;  . TEE WITHOUT CARDIOVERSION N/A 11/10/2017   Procedure: TRANSESOPHAGEAL ECHOCARDIOGRAM (TEE);  Surgeon: Acie Fredrickson Wonda Cheng, MD;  Location[facility];  Service: Cardiovascular;  Laterality: N/A;  . TEE WITHOUT CARDIOVERSION N/A 12/15/2017   Procedure: TRANSESOPHAGEAL ECHOCARDIOGRAM (TEE);  Surgeon: Buford Dresser, MD;  Location: [facility]COPY;  Service: Cardiovascular;  Laterality: N/A;  . TONSILLECTOMY  ~ 1947  . TRANSCATHETER AORTIC VALVE REPLACEMENT, TRANSFEMORAL N/A 06/10/2016   Procedure: TRANSCATHETER AORTIC VALVE REPLACEMENT, TRANSFEMORAL;  Surgeon: Sherren Mocha, MD;  Location: [facility];  Service: Open Heart Surgery;  Laterality: N/A;    Family History  Problem Relation Age of Onset  . Heart disease Mother   . Heart disease Father     Allergies  Allergen Reactions  . Ciprofloxacin Anaphylaxis  . Hydrochlorothiazide Anaphylaxis  . Sulfa Antibiotics Anaphylaxis  . Ace Inhibitors Cough  . Losartan Cough  . Carvedilol Cough    Pt states it "causes him too cough."  . Lisinopril Cough    Pt reports worsening cough and "feeling wheezy."  . Soy Allergy     Current Outpatient Medications on File Prior to Visit  Medication Sig Dispense Refill  . albuterol (PROVENTIL) (2.5 MG/3ML) 0.083% nebulizer solution Take 3 mLs (2.5 mg total) by nebulization every 8 (eight) hours as needed for wheezing or shortness of breath. 50 mL 0  . apixaban (ELIQUIS) 2.5 MG TABS tablet Take 1 tablet (2.5 mg total) by mouth 2 (two) times daily. 180 tablet 3  . atorvastatin (LIPITOR) 20 MG tablet Take 1 tablet (20 mg total) by mouth daily. 90 tablet 3  . budesonide-formoterol (SYMBICORT) 80-4.5 MCG/ACT inhaler Inhale 2 puffs into the lungs 2 (two) times daily. 1 Inhaler 0  . cholecalciferol (VITAMIN D) 1000 units tablet Take 1,000 Units by mouth 2 (two) times a week.     . isosorbide mononitrate (IMDUR) 30 MG 24 hr tablet Take 0.5 tablets (15 mg total) by mouth daily. 90 tablet 1  . lactobacillus acidophilus (BACID) TABS tablet Take 1 tablet by mouth 2 (two) times daily. While on antibiotics (Bacid 1 billion cell 250mg )    . latanoprost (XALATAN) 0.005 % ophthalmic solution Place 1 drop into both eyes at bedtime.    Marland Kitchen levothyroxine (SYNTHROID, LEVOTHROID) 25 MCG tablet Take 0.5 tablets (12.5 mcg total) by mouth  daily before breakfast. 45 tablet 3  . lidocaine (LIDODERM) 5 % Place 1 patch onto the skin daily. Remove & Discard patch within 12 hours or as directed by MD 30 patch 0  . menthol-cetylpyridinium (CEPACOL) 3 MG lozenge Take 1 lozenge (3 mg total) by mouth as needed for sore throat. 100 tablet 12  . metoprolol tartrate (LOPRESSOR) 50 MG tablet Take 1 tablet (50 mg total) by mouth 2 (  two) times daily. 60 tablet 0  . Netarsudil Dimesylate (RHOPRESSA) 0.02 % SOLN Place 1 drop into both eyes at bedtime. Sample given by the physician    . nystatin cream (MYCOSTATIN) Apply 1 application topically 2 (two) times daily. 30 g 3  . OXYGEN Inhale 2 L into the lungs daily as needed (shortness of breath or chest pain).    . polyethylene glycol (MIRALAX / GLYCOLAX) packet Take 17 g by mouth daily. (Patient taking differently: Take 17 g by mouth daily as needed for moderate constipation. ) 14 each 0  . senna (SENOKOT) 8.6 MG TABS tablet Take 1 tablet (8.6 mg total) by mouth daily as needed for mild constipation. 120 each 0  . vitamin B-12 (CYANOCOBALAMIN) 1000 MCG tablet Take 1,000 mcg by mouth daily.    . nitroGLYCERIN (NITROSTAT) 0.4 MG SL tablet Place 1 tablet (0.4 mg total) under the tongue every 5 (five) minutes as needed. (Patient not taking: Reported on 12/30/2017) 25 tablet 3   No current facility-administered medications on file prior to visit.     BP (!) 142/60   Temp 97.7 F (36.5 C) (Oral)   Wt 126 lb (57.2 kg)   BMI 20.97 kg/m       Objective:   Physical Exam  Constitutional: He is oriented to person, place, and time. He appears well-developed and well-nourished. No distress.  Eyes: Pupils are equal, round, and reactive to light. EOM are normal. Right eye exhibits no discharge. Left eye exhibits no discharge.  Neck: Normal range of motion. Neck supple.  Cardiovascular: Normal rate, regular rhythm, normal heart sounds and intact distal pulses. Exam reveals no gallop and no friction rub.  No  murmur heard. Pulmonary/Chest: Effort normal and breath sounds normal. No stridor. No respiratory distress. He has no wheezes. He has no rales. He exhibits no tenderness.  Abdominal: Soft. Bowel sounds are normal.  Musculoskeletal: He exhibits no edema, tenderness or deformity.  Slow steady gait with ambulation with rolling walker  Neurological: He is alert and oriented to person, place, and time.  Skin: Skin is dry. Rash noted. He is not diaphoretic.  Psychiatric: He has a normal mood and affect. His behavior is normal. Judgment and thought content normal.  Nursing note and vitals reviewed.     Assessment & Plan:  1. Enterococcal bacteremia -Reviewed labs, hospital notes, and imaging in detail -[name] looks well today in the office, we will have him continue with cardiology follow-up.  We will check CBC and BMP today in the office.  And follow-up with me in 3 months or sooner if needed - CBC with Differential/Platelet - Basic Metabolic Panel  2. Endocarditis of prosthetic valve, subsequent encounter  - CBC with Differential/Platelet - Basic Metabolic Panel  3. Tinea cruris -I will have him mix a small amount of Kenalog cream with his nystatin cream and apply a thin layer BID to fungal infection  - triamcinolone ointment (KENALOG) 0.5 %; Apply 1 application topically 2 (two) times daily.  Dispense: 30 g; Refill: 1 - Follow up if not resolved in 1-2 weeks  4. Intractable back pain  - lidocaine (LIDODERM) 5 %; Place 1 patch onto the skin daily. Remove & Discard patch within 12 hours or as directed by MD  Dispense: 30 patch; Refill: 3  5. Gait instability -I do believe he would benefit from some outpatient physical therapy.  We talked about this and he is wife feel as though this would be a good idea.  They  have an upcoming appointment with cardiology and I would feel more comfortable for them to see cardiology prior to initiating physical therapy   Dorothyann Peng, NP

## 2017-12-31 ENCOUNTER — Inpatient Hospital Stay: Payer: Medicare Other | Admitting: Adult Health

## 2017-12-31 ENCOUNTER — Telehealth: Payer: Self-pay | Admitting: Family Medicine

## 2017-12-31 DIAGNOSIS — D649 Anemia, unspecified: Secondary | ICD-10-CM

## 2017-12-31 DIAGNOSIS — N184 Chronic kidney disease, stage 4 (severe): Secondary | ICD-10-CM

## 2017-12-31 HISTORY — DX: Anemia, unspecified: D64.9

## 2017-12-31 HISTORY — DX: Chronic kidney disease, stage 4 (severe): N18.4

## 2017-12-31 NOTE — Telephone Encounter (Signed)
Copied from [place] [id number]. Topic: General - Other >> Dec 29, 2017 10:26 AM Judyann Munson wrote: Reason for CRM:  Darlina Guys- [facility] calling in to regards Home health care will start 12-31-17  >> Dec 31, 2017  3:31 PM Judyann Munson wrote: Jackelyn Poling from [facility] is calling to advise to patient declined home health service. Patient stated he doesn't need these services.   Cb# [phone]  Please advise

## 2018-01-01 ENCOUNTER — Encounter: Payer: Self-pay | Admitting: Cardiovascular Disease

## 2018-01-01 ENCOUNTER — Ambulatory Visit: Payer: Medicare Other | Admitting: Cardiovascular Disease

## 2018-01-01 VITALS — BP 150/70 | HR 71 | Ht 65.0 in | Wt 127.4 lb

## 2018-01-01 DIAGNOSIS — I739 Peripheral vascular disease, unspecified: Secondary | ICD-10-CM | POA: Insufficient documentation

## 2018-01-01 HISTORY — DX: Peripheral vascular disease, unspecified: I73.9

## 2018-01-01 NOTE — Progress Notes (Signed)
01/01/2018 SARGENT MANKEY   May 22, 1930  [id number]  Primary Physician Ryan [name], Tommi Rumps, NP Primary Cardiologist: Ryan Harp MD Ryan [name], [place]  HPI:  Ryan [name] is a 84 y.o. thin chronically ill-appearing married Caucasian male father 3 children accompanied by his wife Ryan [name] today.  He was referred by Dr. Meda [name] for peripheral vascular evaluation.  He does have a complex past medical history notable for CAD with multiple coronary interventions in the past, PAD status post bilateral carotid endarterectomies.  He is had TAVR by Dr. Burt [name] January 2018 and recently has had prosthetic valve endocarditis and finished a 7-week course of antibiotics in a nursing facility.  He has PAF on Eliquis as well.  He has complained of some atypical lower extremity pain mostly on the right and had Doppler studies performed by a mobile service while at the nursing facility that suggested monophasic waveforms in his right common femoral artery on 12/07/2017.   Current Meds  Medication Sig  . albuterol (PROVENTIL) (2.5 MG/3ML) 0.083% nebulizer solution Take 3 mLs (2.5 mg total) by nebulization every 8 (eight) hours as needed for wheezing or shortness of breath.  Marland Kitchen apixaban (ELIQUIS) 2.5 MG TABS tablet Take 1 tablet (2.5 mg total) by mouth 2 (two) times daily.  Marland Kitchen atorvastatin (LIPITOR) 20 MG tablet Take 1 tablet (20 mg total) by mouth daily.  . budesonide-formoterol (SYMBICORT) 80-4.5 MCG/ACT inhaler Inhale 2 puffs into the lungs 2 (two) times daily.  . cholecalciferol (VITAMIN D) 1000 units tablet Take 1,000 Units by mouth 2 (two) times a week.   . isosorbide mononitrate (IMDUR) 30 MG 24 hr tablet Take 0.5 tablets (15 mg total) by mouth daily.  Marland Kitchen lactobacillus acidophilus (BACID) TABS tablet Take 1 tablet by mouth 2 (two) times daily. While on antibiotics (Bacid 1 billion cell 250mg )  . latanoprost (XALATAN) 0.005 % ophthalmic solution Place 1 drop into both eyes at bedtime.  Marland Kitchen levothyroxine  (SYNTHROID, LEVOTHROID) 25 MCG tablet Take 0.5 tablets (12.5 mcg total) by mouth daily before breakfast.  . lidocaine (LIDODERM) 5 % Place 1 patch onto the skin daily. Remove & Discard patch within 12 hours or as directed by MD  . menthol-cetylpyridinium (CEPACOL) 3 MG lozenge Take 1 lozenge (3 mg total) by mouth as needed for sore throat.  . metoprolol tartrate (LOPRESSOR) 50 MG tablet Take 1 tablet (50 mg total) by mouth 2 (two) times daily.  Ryan [name] (RHOPRESSA) 0.02 % SOLN Place 1 drop into both eyes at bedtime. Sample given by the physician  . nitroGLYCERIN (NITROSTAT) 0.4 MG SL tablet Place 1 tablet (0.4 mg total) under the tongue every 5 (five) minutes as needed.  . nystatin cream (MYCOSTATIN) Apply 1 application topically 2 (two) times daily.  . OXYGEN Inhale 2 L into the lungs daily as needed (shortness of breath or chest pain).  . polyethylene glycol (MIRALAX / GLYCOLAX) packet Take 17 g by mouth daily. (Patient taking differently: Take 17 g by mouth daily as needed for moderate constipation. )  . senna (SENOKOT) 8.6 MG TABS tablet Take 1 tablet (8.6 mg total) by mouth daily as needed for mild constipation.  . vitamin B-12 (CYANOCOBALAMIN) 1000 MCG tablet Take 1,000 mcg by mouth daily.  . [DISCONTINUED] triamcinolone ointment (KENALOG) 0.5 % Apply 1 application topically 2 (two) times daily.     Allergies  Allergen Reactions  . Ciprofloxacin Anaphylaxis  . Hydrochlorothiazide Anaphylaxis  . Sulfa Antibiotics Anaphylaxis  . Ace Inhibitors Cough  .  Losartan Cough  . Carvedilol Cough    Pt states it "causes him too cough."  . Lisinopril Cough    Pt reports worsening cough and "feeling wheezy."  . Soy Allergy     Social History   Socioeconomic History  . Marital status: Married    Spouse name: Not on file  . Number of children: Not on file  . Years of education: Not on file  . Highest education level: Not on file  Occupational History  . Occupation: retired    Scientific laboratory technician  . Financial resource strain: Not on file  . Food insecurity:    Worry: Not on file    Inability: Not on file  . Transportation needs:    Medical: Not on file    Non-medical: Not on file  Tobacco Use  . Smoking status: Never Smoker  . Smokeless tobacco: Never Used  Substance and Sexual Activity  . Alcohol use: No    Alcohol/week: 0.0 oz    Comment: 04/30/2016 "nothing in years"  . Drug use: No  . Sexual activity: Never  Lifestyle  . Physical activity:    Days per week: Not on file    Minutes per session: Not on file  . Stress: Not on file  Relationships  . Social connections:    Talks on phone: Not on file    Gets together: Not on file    Attends religious service: Not on file    Active member of club or organization: Not on file    Attends meetings of clubs or organizations: Not on file    Relationship status: Not on file  . Intimate partner violence:    Fear of current or ex partner: Not on file    Emotionally abused: Not on file    Physically abused: Not on file    Forced sexual activity: Not on file  Other Topics Concern  . Not on file  Social History Narrative   Retired    Three children - One in [place], One in [place], and one in [place]         Review of Systems: General: negative for chills, fever, night sweats or weight changes.  Cardiovascular: negative for chest pain, dyspnea on exertion, edema, orthopnea, palpitations, paroxysmal nocturnal dyspnea or shortness of breath Dermatological: negative for rash Respiratory: negative for cough or wheezing Urologic: negative for hematuria Abdominal: negative for nausea, vomiting, diarrhea, bright red blood per rectum, melena, or hematemesis Neurologic: negative for visual changes, syncope, or dizziness All other systems reviewed and are otherwise negative except as noted above.    Blood pressure (!) 150/70, pulse 71, height 5\' 5"  (1.651 m), weight 127 lb 6.4 oz (57.8 kg).  General appearance:  alert and no distress Neck: no adenopathy, no carotid bruit, no JVD, supple, symmetrical, trachea midline and thyroid not enlarged, symmetric, no tenderness/mass/nodules Lungs: clear to auscultation bilaterally Heart: regular rate and rhythm, S1, S2 normal, no murmur, click, rub or gallop Extremities: extremities normal, atraumatic, no cyanosis or edema Pulses: Diminished pedal pulses bilaterally Skin: Skin color, texture, turgor normal. No rashes or lesions Neurologic: Alert and oriented X 3, normal strength and tone. Normal symmetric reflexes. Normal coordination and gait  EKG not performed today  ASSESSMENT AND PLAN:   Claudication in peripheral vascular disease [facility]) Mr. Neels was referred to me by Dr. Meda [name] for evaluation of claudication he has had this since he has been in rehab facility for SBE difficult to know how significant this is because  he really has not ambulated much he did have a TAVR 06/10/2016 through his right femoral approach.  He had Doppler studies performed 12/07/2017 that questioned inflow issues on his right leg with a monophasic right common femoral waveform.  On exam today he has 2+ femoral pulses bilaterally with a right femoral bruit.  I am going to get formal LEAs and ABIs to further evaluate.      Ryan Harp MD FACP,FACC,FAHA, [facility] 01/01/2018 2:35 PM

## 2018-01-01 NOTE — Assessment & Plan Note (Signed)
Mr. Witucki was referred to me by Dr. Meda Coffee for evaluation of claudication he has had this since he has been in rehab facility for SBE difficult to know how significant this is because he really has not ambulated much he did have a TAVR 06/10/2016 through his right femoral approach.  He had Doppler studies performed 12/07/2017 that questioned inflow issues on his right leg with a monophasic right common femoral waveform.  On exam today he has 2+ femoral pulses bilaterally with a right femoral bruit.  I am going to get formal LEAs and ABIs to further evaluate.

## 2018-01-01 NOTE — Progress Notes (Signed)
vas 

## 2018-01-01 NOTE — Patient Instructions (Signed)
Medication Instructions:   NO CHANGE  Testing/Procedures:  Your physician has requested that you have a lower or upper extremity arterial duplex. This test is an ultrasound of the arteries in the legs or arms. It looks at arterial blood flow in the legs and arms. Allow one hour for Lower and Upper Arterial scans. There are no restrictions or special instructions   Follow-Up:  Your physician recommends that you schedule a follow-up appointment in: [place]

## 2018-01-01 NOTE — Telephone Encounter (Signed)
Left a message for Darlina Guys to return my call.

## 2018-01-03 LAB — CULTURE, BLOOD (SINGLE)
MICRO NUMBER: 90893239
MICRO NUMBER:: 90893194
Result:: NO GROWTH
Result:: NO GROWTH
SPECIMEN QUALITY:: ADEQUATE
SPECIMEN QUALITY:: ADEQUATE

## 2018-01-05 ENCOUNTER — Ambulatory Visit: Payer: Medicare Other | Admitting: Adult Health

## 2018-01-05 ENCOUNTER — Encounter: Payer: Self-pay | Admitting: Adult Health

## 2018-01-05 VITALS — BP 134/60 | Temp 97.8°F | Wt 126.0 lb

## 2018-01-05 DIAGNOSIS — H6122 Impacted cerumen, left ear: Secondary | ICD-10-CM | POA: Diagnosis not present

## 2018-01-05 DIAGNOSIS — N289 Disorder of kidney and ureter, unspecified: Secondary | ICD-10-CM | POA: Diagnosis not present

## 2018-01-05 LAB — BASIC METABOLIC PANEL
BUN: 42 mg/dL — ABNORMAL HIGH (ref 6–23)
CALCIUM: 9 mg/dL (ref 8.4–10.5)
CO2: 29 mEq/L (ref 19–32)
Chloride: 103 mEq/L (ref 96–112)
Creatinine, Ser: 2.56 mg/dL — ABNORMAL HIGH (ref 0.40–1.50)
GFR: 25.47 mL/min — AB (ref >60.00)
GLUCOSE: 174 mg/dL — AB (ref 70–99)
POTASSIUM: 4.6 meq/L (ref 3.5–5.1)
Sodium: 140 mEq/L (ref 135–145)

## 2018-01-05 NOTE — Telephone Encounter (Signed)
Left a message for a return call.

## 2018-01-05 NOTE — Progress Notes (Signed)
Subjective:    Patient ID: Ryan [name], male    DOB: 1929/08/30, 84 y.o.   MRN: [id number]  HPI  84 year old male who  has a past medical history of Age-related macular degeneration, Allergic rhinitis, Amiodarone toxicity, Aortic valve stenosis, Arthritis, Asthma, Cardiomyopathy ([place]), Carotid artery obstruction, Chronic combined systolic and diastolic CHF (congestive heart failure) (HCC), Chronic lower back pain, Coronary arteriosclerosis in native artery, Diverticulitis of colon, DJD (degenerative joint disease), Dyspnea, GERD (gastroesophageal reflux disease), History of elevated PSA (05/2009), History of hiatal hernia, HLD (hyperlipidemia), Hypertension, Leukocytosis (10/2017), Nocturnal hypoxemia, OSA (obstructive sleep apnea), PAF (paroxysmal atrial fibrillation) ([place]), Paroxysmal supraventricular tachycardia ([place]), Pneumonia, Presence of permanent cardiac pacemaker, Primary malignant neoplasm of bladder ([place]), and Prostate cancer ([place]).  He presents to the office today for an acute issue of left ear fullness and hard of hearing. Reports that his symptoms started last night. Denies any pain, drainage, or fevers.   Also, his kidney function was decreased last week during his hospital follow up, this was thought to be due to abx use. We will retest today   He continues to improve. He has been getting in his pool for 20 minutes a day and doing exercises. Reports that he feels as though his stamina is improving and he is feeling less fatigued.   Review of Systems See HPI   Past Medical History:  Diagnosis Date  . Age-related macular degeneration   . Allergic rhinitis   . Amiodarone toxicity   . Aortic valve stenosis    a. s/p TAVR 06/2016.  . Arthritis    "hands, lower back" (04/30/2016)  . Asthma   . Cardiomyopathy ([place])    a. EF declined to 35-40% by echo 09/2017 (previously 50-55% after TAVR).  . Carotid artery obstruction    a. s/p bilat CEA.  . Chronic combined systolic  and diastolic CHF (congestive heart failure) ([place])   . Chronic lower back pain   . Coronary arteriosclerosis in native artery    a. 2017 Cardiac catheterization demonstrated worsening CAD with 70% mid LCx, 80% OM1 and 80% mid RCA stenosis. b.  In 11/17, he underwent successful rotational atherectomy and DES to RCA.  . Diverticulitis of colon   . DJD (degenerative joint disease)   . Dyspnea    "constant but the degree changes"  . GERD (gastroesophageal reflux disease)   . History of elevated PSA 05/2009  . History of hiatal hernia   . HLD (hyperlipidemia)   . Hypertension   . Leukocytosis 10/2017  . Nocturnal hypoxemia   . OSA (obstructive sleep apnea)    intolerant to CPAP  . PAF (paroxysmal atrial fibrillation) ([place])   . Paroxysmal supraventricular tachycardia ([place])   . Pneumonia    "when I was a kid"  . Presence of permanent cardiac pacemaker   . Primary malignant neoplasm of bladder ([place])   . Prostate cancer [facility])     Social History   Socioeconomic History  . Marital status: Married    Spouse name: Not on file  . Number of children: Not on file  . Years of education: Not on file  . Highest education level: Not on file  Occupational History  . Occupation: retired  Scientific laboratory technician  . Financial resource strain: Not on file  . Food insecurity:    Worry: Not on file    Inability: Not on file  . Transportation needs:    Medical: Not on file    Non-medical: Not on  file  Tobacco Use  . Smoking status: Never Smoker  . Smokeless tobacco: Never Used  Substance and Sexual Activity  . Alcohol use: No    Alcohol/week: 0.0 oz    Comment: 04/30/2016 "nothing in years"  . Drug use: No  . Sexual activity: Never  Lifestyle  . Physical activity:    Days per week: Not on file    Minutes per session: Not on file  . Stress: Not on file  Relationships  . Social connections:    Talks on phone: Not on file    Gets together: Not on file    Attends religious service: Not on file     Active member of club or organization: Not on file    Attends meetings of clubs or organizations: Not on file    Relationship status: Not on file  . Intimate partner violence:    Fear of current or ex partner: Not on file    Emotionally abused: Not on file    Physically abused: Not on file    Forced sexual activity: Not on file  Other Topics Concern  . Not on file  Social History Narrative   Retired    Three children - One in [place], One in [place], and one in [place].        Past Surgical History:  Procedure Laterality Date  . APPENDECTOMY    . CARDIAC CATHETERIZATION N/A 01/17/2016   Procedure: Right/Left Heart Cath and Coronary Angiography;  Surgeon: Belva Crome, MD;  Location: [place] [facility];  Service: Cardiovascular;  Laterality: N/A;  . CARDIAC CATHETERIZATION N/A 04/30/2016   Procedure: Coronary/Graft Atherectomy;  Surgeon: Sherren Mocha, MD;  Location: [place] [facility];  Service: Cardiovascular;  Laterality: N/A;  . CAROTID ENDARTERECTOMY Bilateral   . CATARACT EXTRACTION W/ INTRAOCULAR LENS  IMPLANT, BILATERAL Bilateral   . COLON SURGERY  1981   Meckles Diverticulum with volvulus  . EP IMPLANTABLE DEVICE N/A 03/21/2016   Procedure: Pacemaker Implant;  Surgeon: Evans Lance, MD;  Location: [place] [facility];  Service: Cardiovascular;  Laterality: N/A;  . EYE SURGERY    . INGUINAL HERNIA REPAIR Right 2009  . INSERT / REPLACE / REMOVE PACEMAKER    . INSERTION PROSTATE RADIATION SEED    . TEE WITHOUT CARDIOVERSION N/A 06/10/2016   Procedure: TRANSESOPHAGEAL ECHOCARDIOGRAM (TEE);  Surgeon: Sherren Mocha, MD;  Location: [place];  Service: Open Heart Surgery;  Laterality: N/A;  . TEE WITHOUT CARDIOVERSION N/A 11/10/2017   Procedure: TRANSESOPHAGEAL ECHOCARDIOGRAM (TEE);  Surgeon: Acie Fredrickson Wonda Cheng, MD;  Location[facility] ENDOSCOPY;  Service: Cardiovascular;  Laterality: N/A;  . TEE WITHOUT CARDIOVERSION N/A 12/15/2017   Procedure: TRANSESOPHAGEAL ECHOCARDIOGRAM (TEE);  Surgeon:  Buford Dresser, MD;  Location[facility] ENDOSCOPY;  Service: Cardiovascular;  Laterality: N/A;  . TONSILLECTOMY  ~ 1947  . TRANSCATHETER AORTIC VALVE REPLACEMENT, TRANSFEMORAL N/A 06/10/2016   Procedure: TRANSCATHETER AORTIC VALVE REPLACEMENT, TRANSFEMORAL;  Surgeon: Sherren Mocha, MD;  Location: [place];  Service: Open Heart Surgery;  Laterality: N/A;    Family History  Problem Relation Age of Onset  . Heart disease Mother   . Heart disease Father     Allergies  Allergen Reactions  . Ciprofloxacin Anaphylaxis  . Hydrochlorothiazide Anaphylaxis  . Sulfa Antibiotics Anaphylaxis  . Ace Inhibitors Cough  . Losartan Cough  . Carvedilol Cough    Pt states it "causes him too cough."  . Lisinopril Cough    Pt reports worsening cough and "feeling wheezy."  .  Soy Allergy     Current Outpatient Medications on File Prior to Visit  Medication Sig Dispense Refill  . albuterol (PROVENTIL) (2.5 MG/3ML) 0.083% nebulizer solution Take 3 mLs (2.5 mg total) by nebulization every 8 (eight) hours as needed for wheezing or shortness of breath. 50 mL 0  . apixaban (ELIQUIS) 2.5 MG TABS tablet Take 1 tablet (2.5 mg total) by mouth 2 (two) times daily. 180 tablet 3  . atorvastatin (LIPITOR) 20 MG tablet Take 1 tablet (20 mg total) by mouth daily. 90 tablet 3  . budesonide-formoterol (SYMBICORT) 80-4.5 MCG/ACT inhaler Inhale 2 puffs into the lungs 2 (two) times daily. 1 Inhaler 0  . cholecalciferol (VITAMIN D) 1000 units tablet Take 1,000 Units by mouth 2 (two) times a week.     . isosorbide mononitrate (IMDUR) 30 MG 24 hr tablet Take 0.5 tablets (15 mg total) by mouth daily. 90 tablet 1  . lactobacillus acidophilus (BACID) TABS tablet Take 1 tablet by mouth 2 (two) times daily. While on antibiotics (Bacid 1 billion cell 250mg )    . latanoprost (XALATAN) 0.005 % ophthalmic solution Place 1 drop into both eyes at bedtime.    Marland Kitchen levothyroxine (SYNTHROID, LEVOTHROID) 25 MCG tablet Take 0.5 tablets (12.5 mcg  total) by mouth daily before breakfast. 45 tablet 3  . lidocaine (LIDODERM) 5 % Place 1 patch onto the skin daily. Remove & Discard patch within 12 hours or as directed by MD 30 patch 3  . menthol-cetylpyridinium (CEPACOL) 3 MG lozenge Take 1 lozenge (3 mg total) by mouth as needed for sore throat. 100 tablet 12  . metoprolol tartrate (LOPRESSOR) 50 MG tablet Take 1 tablet (50 mg total) by mouth 2 (two) times daily. 60 tablet 0  . Netarsudil Dimesylate (RHOPRESSA) 0.02 % SOLN Place 1 drop into both eyes at bedtime. Sample given by the physician    . nitroGLYCERIN (NITROSTAT) 0.4 MG SL tablet Place 1 tablet (0.4 mg total) under the tongue every 5 (five) minutes as needed. 25 tablet 3  . nystatin cream (MYCOSTATIN) Apply 1 application topically 2 (two) times daily. 30 g 3  . OXYGEN Inhale 2 L into the lungs daily as needed (shortness of breath or chest pain).    . polyethylene glycol (MIRALAX / GLYCOLAX) packet Take 17 g by mouth daily. (Patient taking differently: Take 17 g by mouth daily as needed for moderate constipation. ) 14 each 0  . senna (SENOKOT) 8.6 MG TABS tablet Take 1 tablet (8.6 mg total) by mouth daily as needed for mild constipation. 120 each 0  . vitamin B-12 (CYANOCOBALAMIN) 1000 MCG tablet Take 1,000 mcg by mouth daily.     No current facility-administered medications on file prior to visit.     BP 134/60   Temp 97.8 F (36.6 C) (Oral)   Wt 126 lb (57.2 kg)   BMI 20.97 kg/m       Objective:   Physical Exam  Constitutional: He is oriented to person, place, and time. He appears well-developed and well-nourished. No distress.  HENT:  Cerumen impaction in left ear canal   Cardiovascular: Normal rate and regular rhythm.  Pulmonary/Chest: Effort normal and breath sounds normal.  Neurological: He is alert and oriented to person, place, and time.  Skin: Skin is warm and dry. He is not diaphoretic.  Nursing note and vitals reviewed.     Assessment & Plan:  1. Impacted  cerumen of left ear - Cerumen impaction was easily removed with irrigation. Patient endorses improvement  in symptoms. He tolerated procedure well.   2. Function kidney decreased  - Basic Metabolic Panel  Dorothyann Peng, NP

## 2018-01-06 NOTE — Telephone Encounter (Signed)
Was able to reach [place].  She was calling to inform us that she was able to set up the pt for services.  No action needed.  Will close message.

## 2018-01-08 ENCOUNTER — Telehealth: Payer: Self-pay

## 2018-01-08 NOTE — Telephone Encounter (Signed)
Patient called today for lab results that were drawn on 12/28/17. Patient is concerned on what the results are and would like a call back once labs are released. Will route message to Dr. Linus Salmons for an update on lab results for patient. [place]

## 2018-01-11 ENCOUNTER — Ambulatory Visit (INDEPENDENT_AMBULATORY_CARE_PROVIDER_SITE_OTHER): Payer: Medicare Other | Admitting: *Deleted

## 2018-01-11 ENCOUNTER — Telehealth: Payer: Self-pay | Admitting: *Deleted

## 2018-01-11 ENCOUNTER — Telehealth: Payer: Self-pay | Admitting: Cardiology

## 2018-01-11 DIAGNOSIS — I495 Sick sinus syndrome: Secondary | ICD-10-CM

## 2018-01-11 NOTE — Telephone Encounter (Signed)
Patient called again today to see if his results were complete. RN relayed final culture results of no growth to date. Patient relieved. Please advise if any further steps are needed. Landis Gandy, RN

## 2018-01-11 NOTE — Telephone Encounter (Signed)
Prior auth for Lidocaine 5% patches sent to [email or web address] [facility].

## 2018-01-11 NOTE — Telephone Encounter (Signed)
Spoke with pt and reminded pt of remote transmission that is due today. Pt verbalized understanding.   

## 2018-01-12 ENCOUNTER — Telehealth: Payer: Self-pay | Admitting: Adult Health

## 2018-01-12 ENCOUNTER — Encounter: Payer: Self-pay | Admitting: Cardiology

## 2018-01-12 NOTE — Telephone Encounter (Signed)
Spoke to Ryan [name] and informed her that insurance will not cover the lidocaine patch 5%.  Can get 4% OTC.  She said the 5% patch works better but will try the OTC patch.  Will call back if needed.  No further action at this time.

## 2018-01-12 NOTE — Telephone Encounter (Signed)
Insurance does not like to cover lidocaine patch. If this worked he might be better off ( cost wise) on getting OTC lidocaine patches

## 2018-01-12 NOTE — Progress Notes (Signed)
Remote pacemaker transmission.   

## 2018-01-12 NOTE — Telephone Encounter (Signed)
Copied from [place] [id number]. Topic: Quick Communication - See Telephone Encounter >> Jan 12, 2018 11:11 AM Neva Seat wrote: April Holding [facility] Medicare - [id number]  opt 5  lidocaine (LIDODERM) 5 % - prior authorization has been denied due to Non FDA approved use.

## 2018-01-12 NOTE — Telephone Encounter (Signed)
Ryan [name], can you prescribe an alternative medication?

## 2018-01-14 ENCOUNTER — Encounter: Payer: Self-pay | Admitting: Cardiology

## 2018-01-14 ENCOUNTER — Ambulatory Visit: Payer: Medicare Other | Admitting: Cardiology

## 2018-01-14 ENCOUNTER — Other Ambulatory Visit: Payer: Self-pay | Admitting: Cardiology

## 2018-01-14 VITALS — BP 130/78 | HR 66 | Ht 66.0 in | Wt 125.6 lb

## 2018-01-14 DIAGNOSIS — I110 Hypertensive heart disease with heart failure: Secondary | ICD-10-CM

## 2018-01-14 DIAGNOSIS — T826XXD Infection and inflammatory reaction due to cardiac valve prosthesis, subsequent encounter: Secondary | ICD-10-CM | POA: Diagnosis not present

## 2018-01-14 DIAGNOSIS — I25118 Atherosclerotic heart disease of native coronary artery with other forms of angina pectoris: Secondary | ICD-10-CM

## 2018-01-14 DIAGNOSIS — I5042 Chronic combined systolic (congestive) and diastolic (congestive) heart failure: Secondary | ICD-10-CM | POA: Diagnosis not present

## 2018-01-14 DIAGNOSIS — I38 Endocarditis, valve unspecified: Secondary | ICD-10-CM

## 2018-01-14 DIAGNOSIS — I330 Acute and subacute infective endocarditis: Secondary | ICD-10-CM

## 2018-01-14 MED ORDER — ISOSORBIDE MONONITRATE ER 30 MG PO TB24: 30.0000 mg | ORAL_TABLET | Freq: Every day | ORAL | 3 refills | Status: DC

## 2018-01-14 MED ORDER — METOPROLOL TARTRATE 50 MG PO TABS: 50.0000 mg | ORAL_TABLET | Freq: Two times a day (BID) | ORAL | 0 refills | Status: DC

## 2018-01-14 MED ORDER — ATORVASTATIN CALCIUM 20 MG PO TABS: 20.0000 mg | ORAL_TABLET | Freq: Every day | ORAL | 3 refills | Status: DC

## 2018-01-14 NOTE — Patient Instructions (Addendum)
Medication Instructions:  Increase Imdur to 30 mg, once a day at night  Refilled: Metoprolol and Lipitor   Labwork: Today: At [facility]: BMET, CBC, TSH, CRP, Troponin, ProBNP, Blood culture   Follow-Up: With Dr. Meda Coffee on 8/29 at 10 am  If you need a refill on your cardiac medications before your next appointment, please call your pharmacy.

## 2018-01-14 NOTE — Progress Notes (Signed)
Cardiology Office Note    Date:  01/14/2018  ID:  Ryan [name], DOB [birth year], MRN [id number] PCP:  Dorothyann Peng, NP  Cardiologist:  Ena Dawley, MD   Chief Complaint: Follow up for prosthetic aortic valve endocarditis  History of Present Illness:  Ryan [name] a very pleasant32 year old Caucasian male with past medical history significant for carotid artery disease status post bilateral CEA, CAD status post PTCA/DES to RCA 04/2016, aortic stenosis status post TAVR 06/2016, HTN, HLD, prior SVT, OSA, chronic combined CHF, cardiomyopathy status post PPM, paroxysmal atrial fibrillation on Eliquis who presented to ED on 11/07/2017 with progressive left lower back pain of 3 days duration not relieved by pain medications.Patient was 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 He was doing better until 3 days prior to admission when he developed worsening left low back pain and profound fatigue.    He was admitted on November 07, 2017 and diagnosed with eterococcal bacteremia withprosthetic valve endocarditis.  There was a large vegetation seen on bioprosthetic aortic valve, no vegetation was seen on pacemaker leads.  Neurosurgery was consulted for the back pain as well, and oral prednisone was recommended for a week.  Patient has been on IV antibiotics IV ampicillin and Rocephin for the endocarditis.  Patient was discharged to skilled nursing facility to complete course of antibiotics, currently still ongoing until December 22, 2017 During the hospital stay, the patient developed an acute kidney injury believed to be secondary to dehydration and possible endocarditis embolization. Patient's intractable back pain was further evaluated, CT showed spondylosis, mild to moderate multilevel foraminal stenosis, pars defect L 4-5.   Neurosurgery reassessed on 6/11 and indicatedthat his acute onset of back pain is due to 1 of 3  possibilities: Exacerbation of chronic lumbar spondylosis, spontaneous spinal EDH related to Eliquis or discitis. He recommends nonoperative treatment, continue antibiotics for his bacteremia and if stroke risk felt to be high without Eliquis then consider resuming it. Due to ongoing issues with A. fib, cardiology has reassessed and started him on Eliquis post PICC line placement on 11/13/2017.  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 Second set gram positive cocci in pair.  As per ID, on ampicillin and ceftriaxone for dual beta-lactam therapy. Recommend 6 weeks through December 22, 2017 and follow-up TEE by cardiology prior to stopping treatment in about 5 to 6 weeks. If TEE okay at that time then stop antibiotics. Surveillance blood cultures to be drawn 3 to 5 days after antibiotic completion. Surveillance blood cultures 6/11 continued to be negative. PICC line placed 6/14. Will need lab monitoring on discharge.  The patient denies any further fever or chills and significant improvement of energy since the discharge. Infectious Disease  is scheduled to see patient on December 09, 2017.   During the hospital stay the patient had mild elevation of troponin with flat trend, believed to be secondary to demand ischemia, unchanged EKG, no ischemic work-up was performed.  01/14/2018 -today patient states that again he has no energy, he sleeps a lot, he tries to walk in his pool at home.  He stopped using antibiotics on July 23 with no recurrent fever or chills but prolonged malaise.  Last night he has developed severe chest pain of his life chest wall in the area around his pacemaker, he states that when he was pushing the area that made the pain worse, he try to use Gas-X  that improved his pain, pain lasted for several hours and he almost went to the ER but then decided not to go. No LE edema.  No orthopnea proximal nocturnal dyspnea. He also saw  Dr. Alvester Chou for claudications with abnormal ABIs and is awaiting peripheral arterial ultrasound there is concern for severe inflow disease.  He continues to experience significant claudications after short distance.  Past Medical History:  Diagnosis Date  . Age-related macular degeneration   . Allergic rhinitis   . Amiodarone toxicity   . Aortic valve stenosis    a. s/p TAVR 06/2016.  . Arthritis    "hands, lower back" (04/30/2016)  . Asthma   . Cardiomyopathy ([place])    a. EF declined to 35-40% by echo 09/2017 (previously 50-55% after TAVR).  . Carotid artery obstruction    a. s/p bilat CEA.  . Chronic combined systolic and diastolic CHF (congestive heart failure) ([place])   . Chronic lower back pain   . Coronary arteriosclerosis in native artery    a. 2017 Cardiac catheterization demonstrated worsening CAD with 70% mid LCx, 80% OM1 and 80% mid RCA stenosis. b.  In 11/17, he underwent successful rotational atherectomy and DES to RCA.  . Diverticulitis of colon   . DJD (degenerative joint disease)   . Dyspnea    "constant but the degree changes"  . GERD (gastroesophageal reflux disease)   . History of elevated PSA 05/2009  . History of hiatal hernia   . HLD (hyperlipidemia)   . Hypertension   . Leukocytosis 10/2017  . Nocturnal hypoxemia   . OSA (obstructive sleep apnea)    intolerant to CPAP  . PAF (paroxysmal atrial fibrillation) ([place])   . Paroxysmal supraventricular tachycardia ([place])   . Pneumonia    "when I was a kid"  . Presence of permanent cardiac pacemaker   . Primary malignant neoplasm of bladder ([place])   . Prostate cancer [facility])     Past Surgical History:  Procedure Laterality Date  . APPENDECTOMY    . CARDIAC CATHETERIZATION N/A 01/17/2016   Procedure: Right/Left Heart Cath and Coronary Angiography;  Surgeon: Belva Crome, MD;  Location: [facility];  Service: Cardiovascular;  Laterality: N/A;  . CARDIAC CATHETERIZATION N/A 04/30/2016   Procedure: Coronary/Graft  Atherectomy;  Surgeon: Sherren Mocha, MD;  Location: [facility];  Service: Cardiovascular;  Laterality: N/A;  . CAROTID ENDARTERECTOMY Bilateral   . CATARACT EXTRACTION W/ INTRAOCULAR LENS  IMPLANT, BILATERAL Bilateral   . COLON SURGERY  1981   Meckles Diverticulum with volvulus  . EP IMPLANTABLE DEVICE N/A 03/21/2016   Procedure: Pacemaker Implant;  Surgeon: Evans Lance, MD;  Location: [facility];  Service: Cardiovascular;  Laterality: N/A;  . EYE SURGERY    . INGUINAL HERNIA REPAIR Right 2009  . INSERT / REPLACE / REMOVE PACEMAKER    . INSERTION PROSTATE RADIATION SEED    . TEE WITHOUT CARDIOVERSION N/A 06/10/2016   Procedure: TRANSESOPHAGEAL ECHOCARDIOGRAM (TEE);  Surgeon: Sherren Mocha, MD;  Location: [facility];  Service: Open Heart Surgery;  Laterality: N/A;  . TEE WITHOUT CARDIOVERSION N/A 11/10/2017   Procedure: TRANSESOPHAGEAL ECHOCARDIOGRAM (TEE);  Surgeon: Acie Fredrickson Wonda Cheng, MD;  Location: [facility] END[facility];  Service: Cardiovascular;  Laterality: N/A;  . TEE WITHOUT CARDIOVERSION N/A 12/15/2017   Procedure: TRANSESOPHAGEAL ECHOCARDIOGRAM (TEE);  Surgeon: Buford Dresser, MD;  Location: [facility]OSCOPY;  Service: Cardiovascular;  Laterality: N/A;  . TONSILLECTOMY  ~ 1947  . TRANSCATHETER AORTIC VALVE REPLACEMENT, TRANSFEMORAL N/A 06/10/2016  Procedure: TRANSCATHETER AORTIC VALVE REPLACEMENT, TRANSFEMORAL;  Surgeon: Sherren Mocha, MD;  Location: [facility];  Service: Open Heart Surgery;  Laterality: N/A;    Current Medications: Current Meds  Medication Sig  . albuterol (PROVENTIL) (2.5 MG/3ML) 0.083% nebulizer solution Take 3 mLs (2.5 mg total) by nebulization every 8 (eight) hours as needed for wheezing or shortness of breath.  Marland Kitchen apixaban (ELIQUIS) 2.5 MG TABS tablet Take 1 tablet (2.5 mg total) by mouth 2 (two) times daily.  Marland Kitchen atorvastatin (LIPITOR) 20 MG tablet Take 1 tablet (20 mg total) by mouth daily.  . budesonide-formoterol (SYMBICORT) 80-4.5 MCG/ACT inhaler Inhale 2  puffs into the lungs 2 (two) times daily.  . cholecalciferol (VITAMIN D) 1000 units tablet Take 1,000 Units by mouth 2 (two) times a week.   . lactobacillus acidophilus (BACID) TABS tablet Take 1 tablet by mouth 2 (two) times daily. While on antibiotics (Bacid 1 billion cell 250mg )  . latanoprost (XALATAN) 0.005 % ophthalmic solution Place 1 drop into both eyes at bedtime.  Marland Kitchen levothyroxine (SYNTHROID, LEVOTHROID) 25 MCG tablet Take 0.5 tablets (12.5 mcg total) by mouth daily before breakfast.  . lidocaine (LIDODERM) 5 % Place 1 patch onto the skin daily. Remove & Discard patch within 12 hours or as directed by MD  . menthol-cetylpyridinium (CEPACOL) 3 MG lozenge Take 1 lozenge (3 mg total) by mouth as needed for sore throat.  Mckinley Jewel Dimesylate (RHOPRESSA) 0.02 % SOLN Place 1 drop into both eyes at bedtime. Sample given by the physician  . nitroGLYCERIN (NITROSTAT) 0.4 MG SL tablet Place 1 tablet (0.4 mg total) under the tongue every 5 (five) minutes as needed.  . nystatin cream (MYCOSTATIN) Apply 1 application topically 2 (two) times daily.  . OXYGEN Inhale 2 L into the lungs daily as needed (shortness of breath or chest pain).  . polyethylene glycol (MIRALAX / GLYCOLAX) packet Take 17 g by mouth daily. (Patient taking differently: Take 17 g by mouth daily as needed for moderate constipation. )  . senna (SENOKOT) 8.6 MG TABS tablet Take 1 tablet (8.6 mg total) by mouth daily as needed for mild constipation.  . vitamin B-12 (CYANOCOBALAMIN) 1000 MCG tablet Take 1,000 mcg by mouth daily.  . [DISCONTINUED] atorvastatin (LIPITOR) 20 MG tablet Take 1 tablet (20 mg total) by mouth daily.  . [DISCONTINUED] isosorbide mononitrate (IMDUR) 30 MG 24 hr tablet Take 0.5 tablets (15 mg total) by mouth daily.  . [DISCONTINUED] metoprolol tartrate (LOPRESSOR) 50 MG tablet Take 1 tablet (50 mg total) by mouth 2 (two) times daily.  . [DISCONTINUED] metoprolol tartrate (LOPRESSOR) 50 MG tablet Take 1 tablet (50  mg total) by mouth 2 (two) times daily.    Allergies:   Ciprofloxacin; Hydrochlorothiazide; Sulfa antibiotics; Ace inhibitors; Losartan; Carvedilol; Lisinopril; and Soy allergy   Social History   Socioeconomic History  . Marital status: Married    Spouse name: Not on file  . Number of children: Not on file  . Years of education: Not on file  . Highest education level: Not on file  Occupational History  . Occupation: retired  Scientific laboratory technician  . Financial resource strain: Not on file  . Food insecurity:    Worry: Not on file    Inability: Not on file  . Transportation needs:    Medical: Not on file    Non-medical: Not on file  Tobacco Use  . Smoking status: Never Smoker  . Smokeless tobacco: Never Used  Substance and Sexual Activity  . Alcohol use:  No    Alcohol/week: 0.0 standard drinks    Comment: 04/30/2016 "nothing in years"  . Drug use: No  . Sexual activity: Never  Lifestyle  . Physical activity:    Days per week: Not on file    Minutes per session: Not on file  . Stress: Not on file  Relationships  . Social connections:    Talks on phone: Not on file    Gets together: Not on file    Attends religious service: Not on file    Active member of club or organization: Not on file    Attends meetings of clubs or organizations: Not on file    Relationship status: Not on file  Other Topics Concern  . Not on file  Social History Narrative   Retired    Three children - One in [place], One in [place], and one in [place].         Family History:  The patient's family history includes Heart disease in his father and mother.  ROS:   Please see the history of present illness.  All other systems are reviewed and otherwise negative.    PHYSICAL EXAM:   VS:  BP 130/78   Pulse 66   Ht 5\' 6"  (1.676 m)   Wt 125 lb 9.6 oz (57 kg)   SpO2 96%   BMI 20.27 kg/m   BMI: Body mass index is 20.27 kg/m. GEN: Well nourished, well developed chronically ill appearing WM, in no acute  distress HEENT: normocephalic, atraumatic Neck: no JVD, carotid bruits, or masses Cardiac: RRR; no murmurs, rubs, or gallops, no edema  Respiratory:  Diminished throughout without wheezes/rales/rhonchi, normal work of breathing GI: soft, nontender, nondistended, + BS MS: no deformity or atrophy Skin: warm and dry, no rash Neuro:  Alert and Oriented x 3, Strength and sensation are intact, follows commands. reporst generalized R eye blurriness that comes and goes Psych: euthymic mood, full affect  Wt Readings from Last 3 Encounters:  01/14/18 125 lb 9.6 oz (57 kg)  01/05/18 126 lb (57.2 kg)  01/01/18 127 lb 6.4 oz (57.8 kg)    Studies/Labs Reviewed:   EKG:  EKG was ordered today and personally reviewed by me and demonstrates atrial paced rhythm with prolonged AV conduction, left bundle branch block is no longer present.  Recent Labs: 10/20/2017: Pro B Natriuretic peptide (BNP) 146.0 11/07/2017: B Natriuretic Peptide 219.3 12/02/2017: ALT 22; TSH 2.900 12/30/2017: Hemoglobin 12.1; Platelets 192.0 01/05/2018: BUN 42; Creatinine, Ser 2.56; Potassium 4.6; Sodium 140   Lipid Panel    Component Value Date/Time   CHOL 130 05/01/2017 0827   TRIG 70.0 05/01/2017 0827   HDL 56.00 05/01/2017 0827   CHOLHDL 2 05/01/2017 0827   VLDL 14.0 05/01/2017 0827   LDLCALC 60 05/01/2017 0827    Additional studies/ records that were reviewed today include: Summarized above  ASSESSMENT & PLAN:   1. Bioprosthetic aortic valve endocarditis -diagnosed on November 13, 2017, on antibiotics IV ampicillin and Rocephin for total of 6 weeks until December 22, 2017.  Follow-up TEE on December 15, 2017 showed no evidence for endocarditis.  Repeat 2 sets of blood cultures were obtained on December 28, 2017 and both were negative after 5 days of growth.  The patient is awaiting follow-up with Dr. Linus Salmons  currently no follow-up is arranged.  Because of ongoing malaise and chest pain I will obtain CBC, CRP, blood cultures today.  He is  followed by Dr. Lovena Le, pacemaker will need to be  removed only if he has recurrent enterococcal infection.  2. CAD -he had another episode of chest pain yesterday quite atypical, I will obtain troponin just to be certain, however possibly secondary to gastric reflux or pain related to pacemaker placement.  Not on ASA due to concomitant Eliquis.  Continue metoprolol 50 mg p.o. twice daily.  3.  Paroxysmal atrial fibrillation, he is maintaining sinus rhythm, recently diagnosed with amiodarone toxicity, now only on metoprolol, will continue.  He is finishing a course of prednisone.  4. Chronic combined CHF with cardiomyopathy - LV function has declined over the last several years. He's not had any angina. Appears euvolemic on exam.  5. Sick sinus syndrome, status post pacemaker placement, working normally, followed by Dr. Lovena Le.  6.  Claudications with abnormal ABIs, bilateral arterial duplex scheduled for next week followed by visit by Dr. Gwenlyn Found.  Disposition: Follow-up with me on January 28, 2018.  Follow-up with Dr. Alvester Chou as stated above, I will notify Dr. Linus Salmons to follow with the patient.  Medication Adjustments/Labs and Tests Ordered: Current medicines are reviewed at length with the patient today.  Concerns regarding medicines are outlined above. Medication changes, Labs and Tests ordered today are summarized above and listed in the Patient Instructions accessible in Encounters.   Signed, Ena Dawley, MD  01/14/2018 7:06 PM    [address], [place], [place]  [address] Phone: [phone]; Fax: [phone]

## 2018-01-15 ENCOUNTER — Inpatient Hospital Stay (HOSPITAL_COMMUNITY)
Admission: EM | Admit: 2018-01-15 | Discharge: 2018-01-19 | DRG: 287 | Disposition: A | Discharge status: Home / Self Care | Payer: Medicare Other | Source: Ambulatory Visit | Attending: Cardiovascular Disease | Admitting: Cardiovascular Disease

## 2018-01-15 ENCOUNTER — Encounter (HOSPITAL_COMMUNITY): Payer: Self-pay | Admitting: Emergency Medicine

## 2018-01-15 ENCOUNTER — Telehealth: Payer: Self-pay | Admitting: Cardiology

## 2018-01-15 ENCOUNTER — Emergency Department (HOSPITAL_COMMUNITY): Payer: Medicare Other

## 2018-01-15 ENCOUNTER — Other Ambulatory Visit: Payer: Self-pay

## 2018-01-15 DIAGNOSIS — I160 Hypertensive urgency: Secondary | ICD-10-CM | POA: Diagnosis not present

## 2018-01-15 DIAGNOSIS — Z8679 Personal history of other diseases of the circulatory system: Secondary | ICD-10-CM

## 2018-01-15 DIAGNOSIS — G4733 Obstructive sleep apnea (adult) (pediatric): Secondary | ICD-10-CM | POA: Diagnosis not present

## 2018-01-15 DIAGNOSIS — I447 Left bundle-branch block, unspecified: Secondary | ICD-10-CM | POA: Diagnosis not present

## 2018-01-15 DIAGNOSIS — Z7901 Long term (current) use of anticoagulants: Secondary | ICD-10-CM | POA: Diagnosis not present

## 2018-01-15 DIAGNOSIS — I200 Unstable angina: Secondary | ICD-10-CM | POA: Diagnosis not present

## 2018-01-15 DIAGNOSIS — Z79899 Other long term (current) drug therapy: Secondary | ICD-10-CM

## 2018-01-15 DIAGNOSIS — J45909 Unspecified asthma, uncomplicated: Secondary | ICD-10-CM | POA: Diagnosis present

## 2018-01-15 DIAGNOSIS — Y92239 Unspecified place in hospital as the place of occurrence of the external cause: Secondary | ICD-10-CM | POA: Diagnosis not present

## 2018-01-15 DIAGNOSIS — I5042 Chronic combined systolic (congestive) and diastolic (congestive) heart failure: Secondary | ICD-10-CM | POA: Diagnosis present

## 2018-01-15 DIAGNOSIS — I429 Cardiomyopathy, unspecified: Secondary | ICD-10-CM | POA: Diagnosis present

## 2018-01-15 DIAGNOSIS — Z8546 Personal history of malignant neoplasm of prostate: Secondary | ICD-10-CM

## 2018-01-15 DIAGNOSIS — E785 Hyperlipidemia, unspecified: Secondary | ICD-10-CM | POA: Diagnosis present

## 2018-01-15 DIAGNOSIS — I739 Peripheral vascular disease, unspecified: Secondary | ICD-10-CM | POA: Diagnosis not present

## 2018-01-15 DIAGNOSIS — R079 Chest pain, unspecified: Secondary | ICD-10-CM

## 2018-01-15 DIAGNOSIS — Z882 Allergy status to sulfonamides status: Secondary | ICD-10-CM

## 2018-01-15 DIAGNOSIS — I25110 Atherosclerotic heart disease of native coronary artery with unstable angina pectoris: Principal | ICD-10-CM | POA: Diagnosis present

## 2018-01-15 DIAGNOSIS — I2510 Atherosclerotic heart disease of native coronary artery without angina pectoris: Secondary | ICD-10-CM | POA: Diagnosis not present

## 2018-01-15 DIAGNOSIS — Z9981 Dependence on supplemental oxygen: Secondary | ICD-10-CM | POA: Diagnosis not present

## 2018-01-15 DIAGNOSIS — I495 Sick sinus syndrome: Secondary | ICD-10-CM

## 2018-01-15 DIAGNOSIS — M542 Cervicalgia: Secondary | ICD-10-CM | POA: Diagnosis not present

## 2018-01-15 DIAGNOSIS — I130 Hypertensive heart and chronic kidney disease with heart failure and stage 1 through stage 4 chronic kidney disease, or unspecified chronic kidney disease: Secondary | ICD-10-CM | POA: Diagnosis present

## 2018-01-15 DIAGNOSIS — I480 Paroxysmal atrial fibrillation: Secondary | ICD-10-CM | POA: Diagnosis not present

## 2018-01-15 DIAGNOSIS — N184 Chronic kidney disease, stage 4 (severe): Secondary | ICD-10-CM | POA: Diagnosis present

## 2018-01-15 DIAGNOSIS — Z881 Allergy status to other antibiotic agents status: Secondary | ICD-10-CM

## 2018-01-15 DIAGNOSIS — Z923 Personal history of irradiation: Secondary | ICD-10-CM | POA: Diagnosis not present

## 2018-01-15 DIAGNOSIS — I4891 Unspecified atrial fibrillation: Secondary | ICD-10-CM | POA: Diagnosis not present

## 2018-01-15 DIAGNOSIS — W010XXA Fall on same level from slipping, tripping and stumbling without subsequent striking against object, initial encounter: Secondary | ICD-10-CM | POA: Diagnosis not present

## 2018-01-15 DIAGNOSIS — Z7951 Long term (current) use of inhaled steroids: Secondary | ICD-10-CM

## 2018-01-15 DIAGNOSIS — R0789 Other chest pain: Secondary | ICD-10-CM | POA: Diagnosis not present

## 2018-01-15 DIAGNOSIS — Z950 Presence of cardiac pacemaker: Secondary | ICD-10-CM | POA: Diagnosis not present

## 2018-01-15 DIAGNOSIS — Z888 Allergy status to other drugs, medicaments and biological substances status: Secondary | ICD-10-CM

## 2018-01-15 DIAGNOSIS — R7989 Other specified abnormal findings of blood chemistry: Secondary | ICD-10-CM | POA: Diagnosis present

## 2018-01-15 DIAGNOSIS — I6529 Occlusion and stenosis of unspecified carotid artery: Secondary | ICD-10-CM | POA: Diagnosis present

## 2018-01-15 DIAGNOSIS — Z952 Presence of prosthetic heart valve: Secondary | ICD-10-CM

## 2018-01-15 DIAGNOSIS — M25519 Pain in unspecified shoulder: Secondary | ICD-10-CM | POA: Diagnosis not present

## 2018-01-15 DIAGNOSIS — Z91018 Allergy to other foods: Secondary | ICD-10-CM

## 2018-01-15 DIAGNOSIS — I350 Nonrheumatic aortic (valve) stenosis: Secondary | ICD-10-CM | POA: Diagnosis present

## 2018-01-15 DIAGNOSIS — Z8551 Personal history of malignant neoplasm of bladder: Secondary | ICD-10-CM | POA: Diagnosis not present

## 2018-01-15 LAB — BASIC METABOLIC PANEL
Anion gap: 9 (ref 5–15)
BUN/Creatinine Ratio: 16 (ref 10–24)
BUN: 39 mg/dL — ABNORMAL HIGH (ref 8–27)
BUN: 36 mg/dL — ABNORMAL HIGH (ref 8–23)
CO2: 23 mmol/L (ref 20–29)
CO2: 23 mmol/L (ref 22–32)
Calcium: 9.2 mg/dL (ref 8.6–10.2)
Calcium: 9 mg/dL (ref 8.9–10.3)
Chloride: 103 mmol/L (ref 96–106)
Chloride: 106 mmol/L (ref 98–111)
Creatinine, Ser: 2.45 mg/dL — ABNORMAL HIGH (ref 0.76–1.27)
Creatinine, Ser: 2.49 mg/dL — ABNORMAL HIGH (ref 0.61–1.24)
GFR calc Af Amer: 27 mL/min/{1.73_m2} — ABNORMAL LOW (ref >59)
GFR calc Af Amer: 26 mL/min — ABNORMAL LOW (ref >60)
GFR calc non Af Amer: 23 mL/min/{1.73_m2} — ABNORMAL LOW (ref >59)
GFR, EST NON AFRICAN AMERICAN: 22 mL/min — AB (ref >60)
GLUCOSE: 99 mg/dL (ref 70–99)
Glucose: 89 mg/dL (ref 65–99)
POTASSIUM: 4.2 mmol/L (ref 3.5–5.1)
Potassium: 4.5 mmol/L (ref 3.5–5.2)
Sodium: 142 mmol/L (ref 134–144)
Sodium: 138 mmol/L (ref 135–145)

## 2018-01-15 LAB — CBC
HCT: 38.7 % — ABNORMAL LOW (ref 39.0–52.0)
HEMOGLOBIN: 12.1 g/dL — AB (ref 13.0–17.0)
Hematocrit: 37.8 % (ref 37.5–51.0)
Hemoglobin: 12.2 g/dL — ABNORMAL LOW (ref 13.0–17.7)
MCH: 29.3 pg (ref 26.6–33.0)
MCH: 29.8 pg (ref 26.0–34.0)
MCHC: 32.3 g/dL (ref 31.5–35.7)
MCHC: 31.3 g/dL (ref 30.0–36.0)
MCV: 91 fL (ref 79–97)
MCV: 95.3 fL (ref 78.0–100.0)
Platelets: 106 10*3/uL — ABNORMAL LOW (ref 150–450)
Platelets: 100 10*3/uL — ABNORMAL LOW (ref 150–400)
RBC: 4.16 x10E6/uL (ref 4.14–5.80)
RBC: 4.06 MIL/uL — ABNORMAL LOW (ref 4.22–5.81)
RDW: 18.3 % — ABNORMAL HIGH (ref 12.3–15.4)
RDW: 18.2 % — ABNORMAL HIGH (ref 11.5–15.5)
WBC: 7.5 10*3/uL (ref 3.4–10.8)
WBC: 7.9 10*3/uL (ref 4.0–10.5)

## 2018-01-15 LAB — PRO B NATRIURETIC PEPTIDE: NT-Pro BNP: 3168 pg/mL — ABNORMAL HIGH (ref 0–486)

## 2018-01-15 LAB — TROPONIN I
Troponin I: 0.29 ng/mL (ref 0.00–0.04)
Troponin I: 0.1 ng/mL (ref <0.03)

## 2018-01-15 LAB — I-STAT TROPONIN, ED: Troponin i, poc: 0.1 ng/mL (ref 0.00–0.08)

## 2018-01-15 LAB — HEPARIN LEVEL (UNFRACTIONATED): HEPARIN UNFRACTIONATED: 1.42 [IU]/mL — AB (ref 0.30–0.70)

## 2018-01-15 LAB — TSH: TSH: 3.39 u[IU]/mL (ref 0.450–4.500)

## 2018-01-15 LAB — C-REACTIVE PROTEIN: CRP: 9 mg/L (ref 0–10)

## 2018-01-15 LAB — APTT: APTT: 73 s — AB (ref 24–36)

## 2018-01-15 MED ORDER — SENNA 8.6 MG PO TABS: 1.0000 | ORAL_TABLET | Freq: Every day | ORAL | Status: DC | PRN

## 2018-01-15 MED ORDER — ALBUTEROL SULFATE (2.5 MG/3ML) 0.083% IN NEBU: 2.5000 mg | INHALATION_SOLUTION | Freq: Three times a day (TID) | RESPIRATORY_TRACT | Status: DC | PRN

## 2018-01-15 MED ORDER — HEPARIN (PORCINE) IN NACL 100-0.45 UNIT/ML-% IJ SOLN
750.0000 [IU]/h | INTRAMUSCULAR | Status: DC
  Administered 2018-01-15: 700 [IU]/h via INTRAVENOUS
  Administered 2018-01-16: 750 [IU]/h via INTRAVENOUS
  Filled 2018-01-15 (×2): qty 250

## 2018-01-15 MED ORDER — POLYETHYLENE GLYCOL 3350 17 G PO PACK: 17.0000 g | PACK | Freq: Every day | ORAL | Status: DC | PRN

## 2018-01-15 MED ORDER — ASPIRIN EC 81 MG PO TBEC
81.0000 mg | DELAYED_RELEASE_TABLET | Freq: Every day | ORAL | Status: DC
  Administered 2018-01-16 – 2018-01-19 (×2): 81 mg via ORAL
  Filled 2018-01-15 (×4): qty 1

## 2018-01-15 MED ORDER — SODIUM CHLORIDE 0.9 % IV SOLN
INTRAVENOUS | Status: DC
  Administered 2018-01-15 – 2018-01-17 (×4): via INTRAVENOUS

## 2018-01-15 MED ORDER — ATORVASTATIN CALCIUM 20 MG PO TABS
20.0000 mg | ORAL_TABLET | Freq: Every day | ORAL | Status: DC
  Administered 2018-01-15 – 2018-01-18 (×4): 20 mg via ORAL
  Filled 2018-01-15 (×4): qty 1

## 2018-01-15 MED ORDER — ACETAMINOPHEN 500 MG PO TABS
1000.0000 mg | ORAL_TABLET | Freq: Once | ORAL | Status: AC
  Administered 2018-01-15: 1000 mg via ORAL
  Filled 2018-01-15: qty 2

## 2018-01-15 MED ORDER — NETARSUDIL DIMESYLATE 0.02 % OP SOLN: 1.0000 [drp] | Freq: Every day | OPHTHALMIC | Status: DC

## 2018-01-15 MED ORDER — SODIUM CHLORIDE 0.9% FLUSH
3.0000 mL | Freq: Two times a day (BID) | INTRAVENOUS | Status: DC
  Administered 2018-01-17: 3 mL via INTRAVENOUS

## 2018-01-15 MED ORDER — NITROGLYCERIN 0.4 MG SL SUBL: 0.4000 mg | SUBLINGUAL_TABLET | SUBLINGUAL | Status: DC | PRN

## 2018-01-15 MED ORDER — ACETAMINOPHEN 325 MG PO TABS
650.0000 mg | ORAL_TABLET | ORAL | Status: DC | PRN
  Administered 2018-01-15: 650 mg via ORAL
  Filled 2018-01-15: qty 2

## 2018-01-15 MED ORDER — SODIUM CHLORIDE 0.9 % IV SOLN: 250.0000 mL | INTRAVENOUS | Status: DC | PRN

## 2018-01-15 MED ORDER — SODIUM CHLORIDE 0.9% FLUSH: 3.0000 mL | INTRAVENOUS | Status: DC | PRN

## 2018-01-15 MED ORDER — ONDANSETRON HCL 4 MG/2ML IJ SOLN: 4.0000 mg | Freq: Four times a day (QID) | INTRAMUSCULAR | Status: DC | PRN

## 2018-01-15 MED ORDER — ISOSORBIDE MONONITRATE ER 30 MG PO TB24
30.0000 mg | ORAL_TABLET | Freq: Every day | ORAL | Status: DC
  Administered 2018-01-15 – 2018-01-16 (×2): 30 mg via ORAL
  Filled 2018-01-15 (×2): qty 1

## 2018-01-15 MED ORDER — METOPROLOL TARTRATE 50 MG PO TABS
50.0000 mg | ORAL_TABLET | Freq: Two times a day (BID) | ORAL | Status: DC
  Administered 2018-01-15 – 2018-01-16 (×4): 50 mg via ORAL
  Filled 2018-01-15 (×3): qty 1
  Filled 2018-01-15: qty 2

## 2018-01-15 MED ORDER — MOMETASONE FURO-FORMOTEROL FUM 100-5 MCG/ACT IN AERO
2.0000 | INHALATION_SPRAY | Freq: Two times a day (BID) | RESPIRATORY_TRACT | Status: DC
  Administered 2018-01-16 – 2018-01-19 (×7): 2 via RESPIRATORY_TRACT
  Filled 2018-01-15: qty 8.8

## 2018-01-15 MED ORDER — VITAMIN D 1000 UNITS PO TABS: 1000.0000 [IU] | ORAL_TABLET | ORAL | Status: DC

## 2018-01-15 MED ORDER — ASPIRIN 81 MG PO CHEW
81.0000 mg | CHEWABLE_TABLET | ORAL | Status: AC
  Administered 2018-01-18: 81 mg via ORAL
  Filled 2018-01-15: qty 1

## 2018-01-15 MED ORDER — SODIUM CHLORIDE 0.9 % WEIGHT BASED INFUSION: 1.0000 mL/kg/h | INTRAVENOUS | Status: DC

## 2018-01-15 MED ORDER — ZOLPIDEM TARTRATE 5 MG PO TABS
5.0000 mg | ORAL_TABLET | Freq: Every evening | ORAL | Status: DC | PRN
  Administered 2018-01-15 – 2018-01-16 (×2): 5 mg via ORAL
  Filled 2018-01-15 (×2): qty 1

## 2018-01-15 MED ORDER — LATANOPROST 0.005 % OP SOLN
1.0000 [drp] | Freq: Every day | OPHTHALMIC | Status: DC
  Administered 2018-01-15 – 2018-01-18 (×4): 1 [drp] via OPHTHALMIC
  Filled 2018-01-15: qty 2.5

## 2018-01-15 MED ORDER — VITAMIN B-12 1000 MCG PO TABS
1000.0000 ug | ORAL_TABLET | Freq: Every day | ORAL | Status: DC
  Administered 2018-01-16 – 2018-01-19 (×3): 1000 ug via ORAL
  Filled 2018-01-15 (×3): qty 1

## 2018-01-15 MED ORDER — FENTANYL CITRATE (PF) 100 MCG/2ML IJ SOLN
50.0000 ug | Freq: Once | INTRAMUSCULAR | Status: AC
  Administered 2018-01-15: 50 ug via INTRAVENOUS
  Filled 2018-01-15: qty 2

## 2018-01-15 MED ORDER — LEVOTHYROXINE SODIUM 25 MCG PO TABS
12.5000 ug | ORAL_TABLET | Freq: Every day | ORAL | Status: DC
  Administered 2018-01-16 – 2018-01-19 (×4): 12.5 ug via ORAL
  Filled 2018-01-15 (×4): qty 1

## 2018-01-15 MED ORDER — SODIUM CHLORIDE 0.9 % WEIGHT BASED INFUSION
3.0000 mL/kg/h | INTRAVENOUS | Status: DC
  Administered 2018-01-18: 3 mL/kg/h via INTRAVENOUS

## 2018-01-15 NOTE — Telephone Encounter (Signed)
Received page from [facility] about critical lab result of troponin-I of 0.29.  I called and discussed the results lab results with the patient's wife.  I advised him to come into the ED for further evaluation and serial troponin testing.  Given his elevated creatinine around 2.5-2.6, I explained that this may not represent a true ACS event, however there was no way to be sure without further serial labs.  She is in agreement with the plan, and will bring the patient to the emergency department shortly.

## 2018-01-15 NOTE — Progress Notes (Signed)
[place] for heparin Indication: chest pain/ACS  Allergies  Allergen Reactions  . Ciprofloxacin Anaphylaxis  . Hydrochlorothiazide Anaphylaxis  . Sulfa Antibiotics Anaphylaxis  . Ace Inhibitors Cough  . Losartan Cough  . Carvedilol Cough    Pt states it "causes him too cough."  . Lisinopril Cough    Pt reports worsening cough and "feeling wheezy."  . Soy Allergy Nausea And Vomiting    Patient Measurements: Height: 5\' 6"  (167.6 cm) Weight: 125 lb 6.4 oz (56.9 kg) IBW/kg (Calculated) : 63.8 Heparin Dosing Weight: 57.2 Kg  Vital Signs: Temp: 98.3 F (36.8 C) (08/16 1946) Temp Source: Oral (08/16 1946) BP: 167/75 (08/16 1946) Pulse Rate: 61 (08/16 1946)  Labs: Recent Labs    01/14/18 1309 01/15/18 0522 01/15/18 1219 01/15/18 2136  HGB 12.2* 12.1*  --   --   HCT 37.8 38.7*  --   --   PLT 106* 100*  --   --   APTT  --   --   --  73*  HEPARINUNFRC  --   --   --  1.42*  CREATININE 2.45* 2.49*  --   --   TROPONINI 0.29*  --  0.10*  --     Estimated Creatinine Clearance: 17.5 mL/min (A) (by C-G formula based on SCr of 2.49 mg/dL (H)).   Assessment:  84 y.o. male with elevated cardiac markers, h/o Afib and Eliquis on hold, for heparin Goal of Therapy:  Heparin level 0.3-0.7 units/ml aPTT 66-102 seconds Monitor platelets by anticoagulation protocol: Yes   Plan:  Continue Heparin at current rate Follow-up am labs.   Phillis Knack, PharmD, BCPS

## 2018-01-15 NOTE — ED Notes (Signed)
Cardiology still at bedside with patient.

## 2018-01-15 NOTE — Progress Notes (Signed)
ANTICOAGULATION CONSULT NOTE - Initial Consult  Pharmacy Consult for heparin Indication: chest pain/ACS  Allergies  Allergen Reactions  . Ciprofloxacin Anaphylaxis  . Hydrochlorothiazide Anaphylaxis  . Sulfa Antibiotics Anaphylaxis  . Ace Inhibitors Cough  . Losartan Cough  . Carvedilol Cough    Pt states it "causes him too cough."  . Lisinopril Cough    Pt reports worsening cough and "feeling wheezy."  . Soy Allergy Nausea And Vomiting    Patient Measurements: Height: 5\' 6"  (167.6 cm) Weight: 126 lb (57.2 kg) IBW/kg (Calculated) : 63.8 Heparin Dosing Weight: 57.2 Kg  Vital Signs: Temp: 98 F (36.7 C) (08/16 0746) Temp Source: Oral (08/16 0746) BP: 143/80 (08/16 1230) Pulse Rate: 58 (08/16 1315)  Labs: Recent Labs    01/14/18 1309 01/15/18 0522 01/15/18 1219  HGB 12.2* 12.1*  --   HCT 37.8 38.7*  --   PLT 106* 100*  --   CREATININE 2.45* 2.49*  --   TROPONINI 0.29*  --  0.10*    Estimated Creatinine Clearance: 17.5 mL/min (A) (by C-G formula based on SCr of 2.49 mg/dL (H)).   Medical History: Past Medical History:  Diagnosis Date  . Age-related macular degeneration   . Allergic rhinitis   . Amiodarone toxicity   . Aortic valve stenosis    a. s/p TAVR 06/2016.  . Arthritis    "hands, lower back" (04/30/2016)  . Asthma   . Cardiomyopathy ([place])    a. EF declined to 35-40% by echo 09/2017 (previously 50-55% after TAVR).  . Carotid artery obstruction    a. s/p bilat CEA.  . Chronic combined systolic and diastolic CHF (congestive heart failure) ([place])   . Chronic lower back pain   . Coronary arteriosclerosis in native artery    a. 2017 Cardiac catheterization demonstrated worsening CAD with 70% mid LCx, 80% OM1 and 80% mid RCA stenosis. b.  In 11/17, he underwent successful rotational atherectomy and DES to RCA.  . Diverticulitis of colon   . DJD (degenerative joint disease)   . Dyspnea    "constant but the degree changes"  . GERD (gastroesophageal reflux  disease)   . History of elevated PSA 05/2009  . History of hiatal hernia   . HLD (hyperlipidemia)   . Hypertension   . Leukocytosis 10/2017  . Nocturnal hypoxemia   . OSA (obstructive sleep apnea)    intolerant to CPAP  . PAF (paroxysmal atrial fibrillation) ([place])   . Paroxysmal supraventricular tachycardia ([place])   . Pneumonia    "when I was a kid"  . Presence of permanent cardiac pacemaker   . Primary malignant neoplasm of bladder ([place])   . Prostate cancer [facility])     Assessment: 66 yoM on Eliquis PTA for atrial fibrillation presenting to the ED after receiving a call from his PCP regarding elevated troponin. Last PTA dose of Eliquis  8/15 in the evening. Patient noted to have poor renal function with CrCl ~17. Hgb 12.1, PLT 100. Pharmacy will use aPTT to dose and avoid bolus.   Goal of Therapy:  Heparin level 0.3-0.7 units/ml aPTT 66-102 seconds Monitor platelets by anticoagulation protocol: Yes   Plan:  Start heparin infusion at 700 units/hr Check aPTT/anti-Xa level in 8 hours and daily while on heparin Continue to monitor H&H and platelets Monitor for s/sx of bleeding  Ryan [name], PharmD Clinical Pharmacist 01/15/2018 2:17 PM Please check AMION for all [place] numbers

## 2018-01-15 NOTE — H&P (Addendum)
Cardiology Admission History and Physical:   Patient ID: Ryan [name]; MRN: [id number]; DOB: [birth year]   Admission date: 01/15/2018  Primary Care Provider: Dorothyann Peng, NP Primary Cardiologist: Ena Dawley, MD  Primary Electrophysiologist:  Cristopher Peru, MD  Chief Complaint:  Elevated troponins  Patient Profile:   Ryan [name] is a 84 y.o. male with a history of of CAD s/p PCI/DES to RCA 04/2016, AS s/p TAVR 06/2016, carotid artery disease s/p b/l CEA, chronic combined CHF, SSS s/p PPM, HTN, HLD, paroxysmal atrial fibrillation on eliquis, claudication, and OSA, who presents with elevated troponins.  History of Present Illness:   Mr. Etheredge was in his usual state of health until the evening of 01/13/18 when he experienced severe left sided chest pain at the site of his pacemaker radiating across his chest with some associated SOB and tenderness to palpation of left upper chest wall. He reported that the pain lasted for hours and almost prompted him to present to the ED, however he did not. He was seen outpatient by Dr. Meda Coffee 01/14/18 at which time he was without chest pain but primary complaint was generalized malaise which has persisted since his recent hospitalization for prosthetic aortic valve endocarditis (IV antibiotics completed 12/22/17). Given his CP complaints and recent infection, he was sent to the labs for CBC (no leukocytosis, H/H stable, PLT 106), CRP (wnl), Trop (0.29), Pro-BNP (3168), and blood cultures (not peformed). Given troponin elevation, he was recommended to present to the ED overnight for further evaluation.   At the time of this evaluation, he is chest pain free. He reports intermittent left upper chest pain but is unable to correlate this with activity/rest. He reports DOE/SOB which has worsened over the past several weeks. He denies orthopnea, PND, LE edema, weight changes, dizziness, lightheadedness, or syncope. His last ischemic evaluation was a NST  12/2016 which was without reversible ischemia but deemed intermediate risk due to reduced EF of 48%.   ED course: Initially hypertensive on arrival but improved at this time, otherwise VSS. Labs notable for electrolytes wnl, Cr 2.49 (appears to be new baseline after IV antibiotics), no leukocytosis, Hgb 12.1, PLT 100, trop 0.10. Cardiology asked to evaluate for further recommendations.   Past Medical History:  Diagnosis Date  . Age-related macular degeneration   . Allergic rhinitis   . Amiodarone toxicity   . Aortic valve stenosis    a. s/p TAVR 06/2016.  . Arthritis    "hands, lower back" (04/30/2016)  . Asthma   . Cardiomyopathy ([place])    a. EF declined to 35-40% by echo 09/2017 (previously 50-55% after TAVR).  . Carotid artery obstruction    a. s/p bilat CEA.  . Chronic combined systolic and diastolic CHF (congestive heart failure) ([place])   . Chronic lower back pain   . Coronary arteriosclerosis in native artery    a. 2017 Cardiac catheterization demonstrated worsening CAD with 70% mid LCx, 80% OM1 and 80% mid RCA stenosis. b.  In 11/17, he underwent successful rotational atherectomy and DES to RCA.  . Diverticulitis of colon   . DJD (degenerative joint disease)   . Dyspnea    "constant but the degree changes"  . GERD (gastroesophageal reflux disease)   . History of elevated PSA 05/2009  . History of hiatal hernia   . HLD (hyperlipidemia)   . Hypertension   . Leukocytosis 10/2017  . Nocturnal hypoxemia   . OSA (obstructive sleep apnea)    intolerant to CPAP  . PAF (paroxysmal  atrial fibrillation) ([place])   . Paroxysmal supraventricular tachycardia ([place])   . Pneumonia    "when I was a kid"  . Presence of permanent cardiac pacemaker   . Primary malignant neoplasm of bladder ([place])   . Prostate cancer [facility])     Past Surgical History:  Procedure Laterality Date  . APPENDECTOMY    . CARDIAC CATHETERIZATION N/A 01/17/2016   Procedure: Right/Left Heart Cath and Coronary  Angiography;  Surgeon: Belva Crome, MD;  Location: [place] [facility];  Service: Cardiovascular;  Laterality: N/A;  . CARDIAC CATHETERIZATION N/A 04/30/2016   Procedure: Coronary/Graft Atherectomy;  Surgeon: Sherren Mocha, MD;  Location: [place] [facility];  Service: Cardiovascular;  Laterality: N/A;  . CAROTID ENDARTERECTOMY Bilateral   . CATARACT EXTRACTION W/ INTRAOCULAR LENS  IMPLANT, BILATERAL Bilateral   . COLON SURGERY  1981   Meckles Diverticulum with volvulus  . EP IMPLANTABLE DEVICE N/A 03/21/2016   Procedure: Pacemaker Implant;  Surgeon: Evans Lance, MD;  Location: [place] [facility];  Service: Cardiovascular;  Laterality: N/A;  . EYE SURGERY    . INGUINAL HERNIA REPAIR Right 2009  . INSERT / REPLACE / REMOVE PACEMAKER    . INSERTION PROSTATE RADIATION SEED    . TEE WITHOUT CARDIOVERSION N/A 06/10/2016   Procedure: TRANSESOPHAGEAL ECHOCARDIOGRAM (TEE);  Surgeon: Sherren Mocha, MD;  Location: [place];  Service: Open Heart Surgery;  Laterality: N/A;  . TEE WITHOUT CARDIOVERSION N/A 11/10/2017   Procedure: TRANSESOPHAGEAL ECHOCARDIOGRAM (TEE);  Surgeon: Acie Fredrickson Wonda Cheng, MD;  Location: [facility] ENDOSCOPY;  Service: Cardiovascular;  Laterality: N/A;  . TEE WITHOUT CARDIOVERSION N/A 12/15/2017   Procedure: TRANSESOPHAGEAL ECHOCARDIOGRAM (TEE);  Surgeon: Buford Dresser, MD;  Location: [facility] ENDOSCOPY;  Service: Cardiovascular;  Laterality: N/A;  . TONSILLECTOMY  ~ 1947  . TRANSCATHETER AORTIC VALVE REPLACEMENT, TRANSFEMORAL N/A 06/10/2016   Procedure: TRANSCATHETER AORTIC VALVE REPLACEMENT, TRANSFEMORAL;  Surgeon: Sherren Mocha, MD;  Location: [place];  Service: Open Heart Surgery;  Laterality: N/A;     Medications Prior to Admission: Prior to Admission medications   Medication Sig Start Date End Date Taking? Authorizing Provider  albuterol (PROVENTIL) (2.5 MG/3ML) 0.083% nebulizer solution Take 3 mLs (2.5 mg total) by nebulization every 8 (eight) hours as needed for wheezing or  shortness of breath. 10/13/17  Yes Nafziger, Tommi Rumps, NP  apixaban (ELIQUIS) 2.5 MG TABS tablet Take 1 tablet (2.5 mg total) by mouth 2 (two) times daily. 10/22/17  Yes Evans Lance, MD  atorvastatin (LIPITOR) 20 MG tablet Take 1 tablet (20 mg total) by mouth daily. 01/14/18  Yes Dorothy Spark, MD  budesonide-formoterol [facility]) 80-4.5 MCG/ACT inhaler Inhale 2 puffs into the lungs 2 (two) times daily. 10/20/17  Yes Lauraine Rinne, NP  cholecalciferol (VITAMIN D) 1000 units tablet Take 1,000 Units by mouth 2 (two) times a week.    Yes [provider]  isosorbide mononitrate (IMDUR) 30 MG 24 hr tablet Take 1 tablet (30 mg total) by mouth daily. Take at night 01/14/18  Yes Dorothy Spark, MD  latanoprost (XALATAN) 0.005 % ophthalmic solution Place 1 drop into both eyes at bedtime.   Yes [provider]  levothyroxine (SYNTHROID, LEVOTHROID) 25 MCG tablet Take 0.5 tablets (12.5 mcg total) by mouth daily before breakfast. 10/21/17  Yes Dorothy Spark, MD  menthol-cetylpyridinium (CEPACOL) 3 MG lozenge Take 1 lozenge (3 mg total) by mouth as needed for sore throat. 10/24/17  Yes Lavina Hamman, MD  metoprolol tartrate (LOPRESSOR) 50 MG tablet  TAKE 1 TABLET(50 MG) BY MOUTH TWICE DAILY 01/14/18  Yes Dorothy Spark, MD  Netarsudil Dimesylate (RHOPRESSA) 0.02 % SOLN Place 1 drop into both eyes at bedtime. Sample given by the physician   Yes [provider]  nitroGLYCERIN (NITROSTAT) 0.4 MG SL tablet Place 1 tablet (0.4 mg total) under the tongue every 5 (five) minutes as needed. 05/01/16  Yes Cheryln Manly, NP  OXYGEN Inhale 2 L into the lungs daily as needed (shortness of breath or chest pain).   Yes [provider]  polyethylene glycol (MIRALAX / GLYCOLAX) packet Take 17 g by mouth daily. Patient taking differently: Take 17 g by mouth daily as needed for moderate constipation.  11/14/17  Yes Bonnell Public, MD  senna (SENOKOT) 8.6 MG TABS tablet Take 1  tablet (8.6 mg total) by mouth daily as needed for mild constipation. 11/14/17  Yes Bonnell Public, MD  vitamin B-12 (CYANOCOBALAMIN) 1000 MCG tablet Take 1,000 mcg by mouth daily.   Yes [provider]  lidocaine (LIDODERM) 5 % Place 1 patch onto the skin daily. Remove & Discard patch within 12 hours or as directed by MD Patient not taking: Reported on 01/15/2018 12/30/17   Dorothyann Peng, NP  nystatin cream (MYCOSTATIN) Apply 1 application topically 2 (two) times daily. Patient not taking: Reported on 01/15/2018 12/23/17   Dorothyann Peng, NP     Allergies:    Allergies  Allergen Reactions  . Ciprofloxacin Anaphylaxis  . Hydrochlorothiazide Anaphylaxis  . Sulfa Antibiotics Anaphylaxis  . Ace Inhibitors Cough  . Losartan Cough  . Carvedilol Cough    Pt states it "causes him too cough."  . Lisinopril Cough    Pt reports worsening cough and "feeling wheezy."  . Soy Allergy Nausea And Vomiting    Social History:   Social History   Socioeconomic History  . Marital status: Married    Spouse name: Not on file  . Number of children: Not on file  . Years of education: Not on file  . Highest education level: Not on file  Occupational History  . Occupation: retired  Scientific laboratory technician  . Financial resource strain: Not on file  . Food insecurity:    Worry: Not on file    Inability: Not on file  . Transportation needs:    Medical: Not on file    Non-medical: Not on file  Tobacco Use  . Smoking status: Never Smoker  . Smokeless tobacco: Never Used  Substance and Sexual Activity  . Alcohol use: No    Alcohol/week: 0.0 standard drinks    Comment: 04/30/2016 "nothing in years"  . Drug use: No  . Sexual activity: Never  Lifestyle  . Physical activity:    Days per week: Not on file    Minutes per session: Not on file  . Stress: Not on file  Relationships  . Social connections:    Talks on phone: Not on file    Gets together: Not on file    Attends religious service: Not  on file    Active member of club or organization: Not on file    Attends meetings of clubs or organizations: Not on file    Relationship status: Not on file  . Intimate partner violence:    Fear of current or ex partner: Not on file    Emotionally abused: Not on file    Physically abused: Not on file    Forced sexual activity: Not on file  Other Topics Concern  .  Not on file  Social History Narrative   Retired    Three children - One in [place], One in [place], and one in [place].        Family History:   The patient's family history includes Heart disease in his father and mother.    ROS:  Please see the history of present illness.  All other ROS reviewed and negative.     Physical Exam/Data:   Vitals:   01/15/18 0845 01/15/18 0900 01/15/18 0945 01/15/18 1000  BP: (!) 151/78 (!) 148/93 124/72 123/73  Pulse: 61 63 (!) 59 60  Resp: 13 18 14 11   Temp:      TempSrc:      SpO2: 96% 97% 93% 95%  Weight:      Height:       No intake or output data in the 24 hours ending 01/15/18 1107 Filed Weights   01/15/18 0517  Weight: 57.2 kg   Body mass index is 20.34 kg/m.  General: Thin elderly gentleman laying in bed in no acute distress.  HEENT: sclera anicteric Neck: no JVD Vascular: No carotid bruits; distal pulses 1+ bilaterally Cardiac:  normal S1, S2; RRR; no murmurs, gallops, or rubs Lungs:  clear to auscultation bilaterally, no wheezing, rhonchi or rales  Abd: soft, nontender, no hepatomegaly  Ext: no edema Musculoskeletal:  No deformities, BUE and BLE strength normal and equal Skin: warm and dry  Neuro:  CNs 2-12 intact, no focal abnormalities noted Psych:  Normal affect    EKG:  Atrial-paced sinus rhythm with LBBB  Relevant CV Studies: TEE 11/2017: Study Conclusions  - Left ventricle: Systolic function was normal. The estimated   ejection fraction was in the range of 50% to 55%. Wall motion was   normal; there were no regional wall motion abnormalities. -  Aortic valve: A prosthesis was present and functioning normally.   The prosthesis had a normal range of motion. The sewing ring   appeared normal, had no rocking motion, and showed no evidence of   dehiscence. No evidence of vegetation. Large vegetations seen   previously are no longer visible. There was trivial paravalvular   regurgitation. - Mitral valve: No evidence of vegetation. - Left atrium: No evidence of thrombus in the atrial cavity or   appendage. - Right atrium: No evidence of thrombus in the atrial cavity or   appendage. - Atrial septum: No defect or patent foramen ovale was identified.   There was an atrial septal aneurysm. - Tricuspid valve: No evidence of vegetation. - Pulmonic valve: No evidence of vegetation. - Line: A venous pacing wire was visualized in the right atrial   cavity and right ventricular cavity, with its tip at the RV apex.   On image two (upper left corner), small oscillating fibrinous   structures were seen on the pacer leads. These were not well   visualized in additional views.  Impressions:  - No evidence of endocarditis.  Right/Left heart catheterization 01/2016:  Prox RCA to Mid RCA lesion, 20 %stenosed.  Mid RCA lesion, 80 %stenosed.  Ost 1st Mrg to 1st Mrg lesion, 80 %stenosed.  Mid Cx to Dist Cx lesion, 70 %stenosed.  Prox LAD to Dist LAD lesion, 40 %stenosed.  The left ventricular systolic function is normal.  LV end diastolic pressure is normal.  The left ventricular ejection fraction is 55-65% by visual estimate.  LV end diastolic pressure is normal.    There is three-vessel coronary calcification.  80% eccentric mid RCA  stenosis.  80% ostial obtuse marginal #1 stenosis.  Moderate diffuse calcified LAD disease less than 50%.  Normal left ventricular systolic function and hemodynamics  Normal pulmonary pressures and pulmonary capillary wedge.  Moderate to severe aortic stenosis with aortic valve area  1.04.   RECOMMENDATIONS:   Anti-ischemic therapy to potentially include beta blocker therapy, antiplatelet therapy, and statin therapy.  Intervention on both the right coronary and obtuse marginal stenosis would be higher than usual risk due to heavy calcification and vessel tortuosity. Initial inclination is medical therapy in the absence of clear anginal symptoms.  Fatigue even at rest is of uncertain etiology. Exclude other possibilities including depression, although on one systemic illnesses.  Coronary atherectomy 04/2016: Successful rotational atherectomy and stenting of the right coronary artery with a 3.0 x 12 mm Synergy DES.  Recommendations: Triple therapy with aspirin 81 mg, Plavix 75 mg, and apixaban 30 days, then discontinue aspirin. Plans for TAVR in approximately 3-4 weeks.  Laboratory Data:  Chemistry Recent Labs  Lab 01/14/18 1309 01/15/18 0522  NA 142 138  K 4.5 4.2  CL 103 106  CO2 23 23  GLUCOSE 89 99  BUN 39* 36*  CREATININE 2.45* 2.49*  CALCIUM 9.2 9.0  GFRNONAA 23* 22*  GFRAA 27* 26*  ANIONGAP  --  9    No results for input(s): PROT, ALBUMIN, AST, ALT, ALKPHOS, BILITOT in the last 168 hours. Hematology Recent Labs  Lab 01/14/18 1309 01/15/18 0522  WBC 7.5 7.9  RBC 4.16 4.06*  HGB 12.2* 12.1*  HCT 37.8 38.7*  MCV 91 95.3  MCH 29.3 29.8  MCHC 32.3 31.3  RDW 18.3* 18.2*  PLT 106* 100*   Cardiac Enzymes Recent Labs  Lab 01/14/18 1309  TROPONINI 0.29*    Recent Labs  Lab 01/15/18 0547  TROPIPOC 0.10*    BNP Recent Labs  Lab 01/14/18 1309  PROBNP 3,168*    DDimer No results for input(s): DDIMER in the last 168 hours.  Radiology/Studies:  Dg Chest 2 View  Result Date: 01/15/2018 CLINICAL DATA:  Chest pain. Lab results indicate endocarditis. History of hypertension. Nonsmoker. EXAM: CHEST - 2 VIEW COMPARISON:  11/07/2017 FINDINGS: Cardiac pacemaker. Postoperative changes with valve prosthesis. Heart size and pulmonary  vascularity are normal. Emphysematous changes and scattered fibrosis in the lungs. No blunting of costophrenic angles. No pneumothorax. Mediastinal contours appear intact. IMPRESSION: Emphysematous changes and fibrosis in the lungs. No evidence of active pulmonary disease. Electronically Signed   By: Lucienne Capers M.D.   On: 01/15/2018 06:07    Assessment and Plan:   1. Unstable angina in patient with CAD s/p PCI/DES to RCA 2017: patient presented after an episode of left sided chest pain which was evaluated outpatient and found to have a troponin 0.29 and recommended to present to ED. Here trop was 0.1. EKG with paced rhythm and LBBB (chronic). CXR without acute findings. Also with vague complaints of intermittent CP/SOB in recent weeks concerning for unstable anginal.  - Plan for [facility] on Monday to further evaluate chest pain complaints - Hold eliquis and maintain on heparin gtt  - Will hydrate over the weekend in anticipation of cath on Monday - Continue statin, imdur, and metoprolol - PRN nitro  2. Chronic combined CHF: EF 50-55% on last TEE 11/2017. CXR without edema. He appears euvolemic on exam. Will hydrate over the weekend in anticipation of cath on Monday - Monitor volume status closely this weekend  - Continue metoprolol  3.  Atrial fibrillation: last  dose of eliquis was 6pm 8/15.  - Continue metoprolol - Heparin gtt while eliquis is on hold for stroke prevention  4. Recent bioprosthetic aortic valve endocarditis: completed a 7 week course of IV antibiotics 12/22/17. No recent fevers and no leukocytosis on labs this admission. Last TEE without endocarditis but mentions small oscillating fibrinous structures on the pacer leads, not well visualized in additional views.  - Will check BCx now as a precaution but anticipate these will be negative  5. Chronic kidney disease: Cr 2.49 today. Prior to antibiotics course, baseline Cr was around 1.1, however Cr has been >2.4 since 12/30/17.  Possible this is his new baseline.  - Given need for LHC, will hydrate over the weekend  - Continue to monitor closely   6. SSS s/p PPM: denies palpitations. Last PPM check last month with normal fucntion and 2% AT/AF burden. - Will have PPM interrogated today  7. HTN: BP elevated on arrival likely due to missing morning medications.  - Resume home antihypertensives  8. Claudications: undergoing PVD work-up with Dr. Gwenlyn Found outpatient. Has an appointment for imaging 8/20 at 10:30am which may need to be rescheduled pending hospital course.  - Continue outpatient follow-up/work-up with Dr. Gwenlyn Found   Severity of Illness: The appropriate patient status for this patient is INPATIENT. Inpatient status is judged to be reasonable and necessary in order to provide the required intensity of service to ensure the patient's safety. The patient's presenting symptoms, physical exam findings, and initial radiographic and laboratory data in the context of their chronic comorbidities is felt to place them at high risk for further clinical deterioration. Furthermore, it is not anticipated that the patient will be medically stable for discharge from the hospital within 2 midnights of admission. The following factors support the patient status of inpatient.   " The patient's presenting symptoms include chest pain. " The worrisome physical exam findings include benign cardiopulmonary exam. " The initial radiographic and laboratory data are worrisome because of elevated troponin. " The chronic co-morbidities include CAD, CHF, AS s/p TAVR c/b endocarditis, HTN, HLD.   * I certify that at the point of admission it is my clinical judgment that the patient will require inpatient hospital care spanning beyond 2 midnights from the point of admission due to high intensity of service, high risk for further deterioration and high frequency of surveillance required.*    For questions or updates, please contact [place] Please consult [email or web address] for contact info under Cardiology/STEMI.    Signed, Abigail Butts, PA-C  01/15/2018 11:07 AM   I have seen and examined the patient along with Abigail Butts, PA-C.  I have reviewed the chart, notes and new data.  I agree with PA/NP's note.  Key new complaints: severe and prolonged chest pain at rest 2 nights ago, albeit different from the symptoms preceding his 2017 stent (weakness, palpitations, dyspnea). Now pain free. Last dose of Eliquis at 1800h yesterday. Key examination changes: paradoxically split S2, normal CV exam, healthy PM site, no murmurs Key new findings / data: LBBB is new from yesterday (but has occurred intermittently in the past). Repeat troponin is still elevated . Creatinine 2.49 is stable compared to readings in the last 30 days, but substantially higher than previous baseline of 1.4 as recently as last June.  PLAN: I think he should undergo coronary angiography for unstable angina (high risk biomarkers, nondiagnostic ECG LBBB), but this should be deferred until anticoagulant has been stopped for at least 48h (due  to renal dysfunction) and he has been adequately hydrated for renal protection. Logistically, this means cardiac catheterization on Monday. Discussed the indication for the procedure and the need for delay with Mr. Popoff and his family and they understand.  Sanda Klein, MD, [place] [phone] 01/15/2018, 1:23 PM

## 2018-01-15 NOTE — ED Notes (Signed)
Heparin verified with Roselyn Reef RN

## 2018-01-15 NOTE — ED Provider Notes (Signed)
[facility] EMERGENCY DEPARTMENT Provider Note   CSN: [id number] Arrival date & time: 01/15/18  0510     History   Chief Complaint Chief Complaint  Patient presents with  . Chest Pain  . abnormal labs    HPI Ryan [name] is a 84 y.o. male.  84 yo M with a chief complaint of a positive troponin.  The patient was seen in the cardiology office yesterday for chest pain that he had the day before.  Described it as a sharp pain that was around his pacemaker.  At that time he had labs drawn, he was then sent home.  He was called about 4 AM with a positive troponin result.  He denies any current chest pain.  He feels that he has had pain to bilateral aspects of his neck that is worse with movement or palpation.  He denies any injury to his neck.  Has had some episodic shortness of breath but denies any currently.  Denies any lower extremity edema.  The patient was recently treated for endocarditis of a artificial heart valve.  He had finished his antibiotic therapy about 3 weeks ago.  He denies any fevers or chills.  The history is provided by the patient.  Chest Pain   This is a new problem. The current episode started yesterday. The problem occurs constantly. The problem has not changed since onset.Pain location: neck. The pain is at a severity of 4/10. The pain is mild. The quality of the pain is described as brief. The pain does not radiate. Duration of episode(s) is 2 hours. Pertinent negatives include no abdominal pain, no fever, no headaches, no palpitations, no shortness of breath and no vomiting.    Past Medical History:  Diagnosis Date  . Age-related macular degeneration   . Allergic rhinitis   . Amiodarone toxicity   . Aortic valve stenosis    a. s/p TAVR 06/2016.  . Arthritis    "hands, lower back" (04/30/2016)  . Asthma   . Cardiomyopathy ([place])    a. EF declined to 35-40% by echo 09/2017 (previously 50-55% after TAVR).  . Carotid artery obstruction    a. s/p bilat CEA.  . Chronic combined systolic and diastolic CHF (congestive heart failure) ([place])   . Chronic lower back pain   . Coronary arteriosclerosis in native artery    a. 2017 Cardiac catheterization demonstrated worsening CAD with 70% mid LCx, 80% OM1 and 80% mid RCA stenosis. b.  In 11/17, he underwent successful rotational atherectomy and DES to RCA.  . Diverticulitis of colon   . DJD (degenerative joint disease)   . Dyspnea    "constant but the degree changes"  . GERD (gastroesophageal reflux disease)   . History of elevated PSA 05/2009  . History of hiatal hernia   . HLD (hyperlipidemia)   . Hypertension   . Leukocytosis 10/2017  . Nocturnal hypoxemia   . OSA (obstructive sleep apnea)    intolerant to CPAP  . PAF (paroxysmal atrial fibrillation) ([place])   . Paroxysmal supraventricular tachycardia ([place])   . Pneumonia    "when I was a kid"  . Presence of permanent cardiac pacemaker   . Primary malignant neoplasm of bladder ([place])   . Prostate cancer [facility])     Patient Active Problem List   Diagnosis Date Noted  . Claudication in peripheral vascular disease ([place]) 01/01/2018  . S/P AVR 12/02/2017  . Acute renal insufficiency 12/02/2017  . 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  Subacute bacterial endocarditis   . Enterococcal bacteremia 11/09/2017  . Intractable back pain 11/07/2017  . Elevated troponin 11/07/2017  . Urinary retention 11/07/2017  . Thrombocytopenia ([place]) 11/07/2017  . Hypertension   . Syncope 10/23/2017  . Decreased diffusion capacity 10/23/2017  . 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 Elevated TSH 06/30/2017  . Anticoagulation management encounter 08/19/2016  . CAD (coronary artery disease), native coronary artery 04/30/2016  . Tachy-brady syndrome ([place])   . Hypertensive heart disease with heart failure ([place])   . Pure hypercholesterolemia   . PAF (paroxysmal atrial fibrillation) ([place]) 03/18/2016  . Chronic combined systolic  (congestive) and diastolic (congestive) heart failure ([place]) 03/18/2016  . Atrial fibrillation with RVR ([place]) 03/18/2016  . Atrial fibrillation with rapid ventricular response ([place]) 03/18/2016  . Fatigue 01/17/2016  . DOE (dyspnea on exertion) 01/17/2016  . Coronary artery disease of native artery of native heart with stable angina pectoris ([place])   . Severe aortic stenosis 10/12/2015    Past Surgical History:  Procedure Laterality Date  . APPENDECTOMY    . CARDIAC CATHETERIZATION N/A 01/17/2016   Procedure: Right/Left Heart Cath and Coronary Angiography;  Surgeon: Belva Crome, MD;  Location: [facility] CV LAB;  Service: Cardiovascular;  Laterality: N/A;  . CARDIAC CATHETERIZATION N/A 04/30/2016   Procedure: Coronary/Graft Atherectomy;  Surgeon: Sherren Mocha, MD;  Location: [facility] CV LAB;  Service: Cardiovascular;  Laterality: N/A;  . CAROTID ENDARTERECTOMY Bilateral   . CATARACT EXTRACTION W/ INTRAOCULAR LENS  IMPLANT, BILATERAL Bilateral   . COLON SURGERY  1981   Meckles Diverticulum with volvulus  . EP IMPLANTABLE DEVICE N/A 03/21/2016   Procedure: Pacemaker Implant;  Surgeon: Evans Lance, MD;  Location: [facility] CV LAB;  Service: Cardiovascular;  Laterality: N/A;  . EYE SURGERY    . INGUINAL HERNIA REPAIR Right 2009  . INSERT / REPLACE / REMOVE PACEMAKER    . INSERTION PROSTATE RADIATION SEED    . TEE WITHOUT CARDIOVERSION N/A 06/10/2016   Procedure: TRANSESOPHAGEAL ECHOCARDIOGRAM (TEE);  Surgeon: Sherren Mocha, MD;  Location: [facility];  Service: Open Heart Surgery;  Laterality: N/A;  . TEE WITHOUT CARDIOVERSION N/A 11/10/2017   Procedure: TRANSESOPHAGEAL ECHOCARDIOGRAM (TEE);  Surgeon: Acie Fredrickson Wonda Cheng, MD;  Location[facility] ENDOSCOPY;  Service: Cardiovascular;  Laterality: N/A;  . TEE WITHOUT CARDIOVERSION N/A 12/15/2017   Procedure: TRANSESOPHAGEAL ECHOCARDIOGRAM (TEE);  Surgeon: Buford Dresser, MD;  Location[facility] ENDOSCOPY;  Service: Cardiovascular;  Laterality: N/A;    . TONSILLECTOMY  ~ 1947  . TRANSCATHETER AORTIC VALVE REPLACEMENT, TRANSFEMORAL N/A 06/10/2016   Procedure: TRANSCATHETER AORTIC VALVE REPLACEMENT, TRANSFEMORAL;  Surgeon: Sherren Mocha, MD;  Location: [place] [facility];  Service: Open Heart Surgery;  Laterality: N/A;        Home Medications    Prior to Admission medications   Medication Sig Start Date End Date Taking? Authorizing Provider  albuterol (PROVENTIL) (2.5 MG/3ML) 0.083% nebulizer solution Take 3 mLs (2.5 mg total) by nebulization every 8 (eight) hours as needed for wheezing or shortness of breath. 10/13/17  Yes Nafziger, Tommi Rumps, NP  apixaban (ELIQUIS) 2.5 MG TABS tablet Take 1 tablet (2.5 mg total) by mouth 2 (two) times daily. 10/22/17  Yes Evans Lance, MD  atorvastatin (LIPITOR) 20 MG tablet Take 1 tablet (20 mg total) by mouth daily. 01/14/18  Yes Dorothy Spark, MD  budesonide-formoterol [facility]) 80-4.5 MCG/ACT inhaler Inhale 2 puffs into the lungs 2 (two) times daily. 10/20/17  Yes Lauraine Rinne, NP  cholecalciferol (VITAMIN D) 1000 units  tablet Take 1,000 Units by mouth 2 (two) times a week.    Yes [provider]  isosorbide mononitrate (IMDUR) 30 MG 24 hr tablet Take 1 tablet (30 mg total) by mouth daily. Take at night 01/14/18  Yes Dorothy Spark, MD  latanoprost (XALATAN) 0.005 % ophthalmic solution Place 1 drop into both eyes at bedtime.   Yes [provider]  levothyroxine (SYNTHROID, LEVOTHROID) 25 MCG tablet Take 0.5 tablets (12.5 mcg total) by mouth daily before breakfast. 10/21/17  Yes Dorothy Spark, MD  menthol-cetylpyridinium (CEPACOL) 3 MG lozenge Take 1 lozenge (3 mg total) by mouth as needed for sore throat. 10/24/17  Yes Lavina Hamman, MD  metoprolol tartrate (LOPRESSOR) 50 MG tablet TAKE 1 TABLET(50 MG) BY MOUTH TWICE DAILY 01/14/18  Yes Dorothy Spark, MD  Netarsudil Dimesylate (RHOPRESSA) 0.02 % SOLN Place 1 drop into both eyes at bedtime. Sample given by the physician   Yes [provider]  nitroGLYCERIN (NITROSTAT) 0.4 MG SL tablet Place 1 tablet (0.4 mg total) under the tongue every 5 (five) minutes as needed. 05/01/16  Yes Cheryln Manly, NP  OXYGEN Inhale 2 L into the lungs daily as needed (shortness of breath or chest pain).   Yes [provider]  polyethylene glycol (MIRALAX / GLYCOLAX) packet Take 17 g by mouth daily. Patient taking differently: Take 17 g by mouth daily as needed for moderate constipation.  11/14/17  Yes Bonnell Public, MD  senna (SENOKOT) 8.6 MG TABS tablet Take 1 tablet (8.6 mg total) by mouth daily as needed for mild constipation. 11/14/17  Yes Bonnell Public, MD  vitamin B-12 (CYANOCOBALAMIN) 1000 MCG tablet Take 1,000 mcg by mouth daily.   Yes [provider]  lidocaine (LIDODERM) 5 % Place 1 patch onto the skin daily. Remove & Discard patch within 12 hours or as directed by MD Patient not taking: Reported on 01/15/2018 12/30/17   Dorothyann Peng, NP  nystatin cream (MYCOSTATIN) Apply 1 application topically 2 (two) times daily. Patient not taking: Reported on 01/15/2018 12/23/17   Dorothyann Peng, NP    Family History Family History  Problem Relation Age of Onset  . Heart disease Mother   . Heart disease Father     Social History Social History   Tobacco Use  . Smoking status: Never Smoker  . Smokeless tobacco: Never Used  Substance Use Topics  . Alcohol use: No    Alcohol/week: 0.0 standard drinks    Comment: 04/30/2016 "nothing in years"  . Drug use: No     Allergies   Ciprofloxacin; Hydrochlorothiazide; Sulfa antibiotics; Ace inhibitors; Losartan; Carvedilol; Lisinopril; and Soy allergy   Review of Systems Review of Systems  Constitutional: Negative for chills and fever.  HENT: Negative for congestion and facial swelling.   Eyes: Negative for discharge and visual disturbance.  Respiratory: Negative for shortness of breath.   Cardiovascular: Negative for chest pain and palpitations.    Gastrointestinal: Negative for abdominal pain, diarrhea and vomiting.  Musculoskeletal: Negative for arthralgias and myalgias.  Skin: Negative for color change and rash.  Neurological: Negative for tremors, syncope and headaches.  Psychiatric/Behavioral: Negative for confusion and dysphoric mood.     Physical Exam Updated Vital Signs BP (!) 142/77   Pulse 60   Temp 98 F (36.7 C) (Oral)   Resp 15   Ht 5\' 6"  (1.676 m)   Wt 57.2 kg   SpO2 91%   BMI 20.34 kg/m   Physical Exam  Constitutional: He is oriented to person, place, and time. He appears well-developed and well-nourished.  HENT:  Head: Normocephalic and atraumatic.  Eyes: Pupils are equal, round, and reactive to light. EOM are normal.  Neck: Normal range of motion. Neck supple. No JVD present.  Cardiovascular: Normal rate and regular rhythm. Exam reveals no gallop and no friction rub.  No murmur heard. Pulmonary/Chest: No respiratory distress. He has no wheezes.  Abdominal: He exhibits no distension and no mass. There is no tenderness. There is no rebound and no guarding.  Musculoskeletal: Normal range of motion.  Neurological: He is alert and oriented to person, place, and time.  Skin: No rash noted. No pallor.  Psychiatric: He has a normal mood and affect. His behavior is normal.  Nursing note and vitals reviewed.    ED Treatments / Results  Labs (all labs ordered are listed, but only abnormal results are displayed) Labs Reviewed  BASIC METABOLIC PANEL - Abnormal; Notable for the following components:      Result Value   BUN 36 (*)    Creatinine, Ser 2.49 (*)    GFR calc non Af Amer 22 (*)    GFR calc Af Amer 26 (*)    All other components within normal limits  CBC - Abnormal; Notable for the following components:   RBC 4.06 (*)    Hemoglobin 12.1 (*)    HCT 38.7 (*)    RDW 18.2 (*)    Platelets 100 (*)    All other components within normal limits  I-STAT TROPONIN, ED - Abnormal; Notable for the  following components:   Troponin i, poc 0.10 (*)    All other components within normal limits    EKG EKG Interpretation  Date/Time:  Friday January 15 2018 05:20:32 EDT Ventricular Rate:  64 PR Interval:  210 QRS Duration: 126 QT Interval:  474 QTC Calculation: 489 R Axis:   29 Text Interpretation:  Atrial-paced rhythm with prolonged AV conduction Left bundle branch block Abnormal ECG No significant change since last tracing Confirmed by Deno Etienne [id number]) on 01/15/2018 5:49:42 AM   Radiology Dg Chest 2 View  Result Date: 01/15/2018 CLINICAL DATA:  Chest pain. Lab results indicate endocarditis. History of hypertension. Nonsmoker. EXAM: CHEST - 2 VIEW COMPARISON:  11/07/2017 FINDINGS: Cardiac pacemaker. Postoperative changes with valve prosthesis. Heart size and pulmonary vascularity are normal. Emphysematous changes and scattered fibrosis in the lungs. No blunting of costophrenic angles. No pneumothorax. Mediastinal contours appear intact. IMPRESSION: Emphysematous changes and fibrosis in the lungs. No evidence of active pulmonary disease. Electronically Signed   By: Lucienne Capers M.D.   On: 01/15/2018 06:07    Procedures Procedures (including critical care time)  Medications Ordered in ED Medications - No data to display   Initial Impression / Assessment and Plan / ED Course  I have reviewed the triage vital signs and the nursing notes.  Pertinent labs & imaging results that were available during my care of the patient were reviewed by me and considered in my medical decision making (see chart for details).     84 yo M with a chief complaint of chest pain.  This occurred a couple days ago and resolved.  He is in the cardiology office and had a troponin that was drawn that was positive.  He was then called and sent here for evaluation.  The patient's troponin on point-of-care testing is lower than what was checked in the office.  He continues to have no chest pain  but is having  some lateral neck pain that is worse with movement and palpation.  His kidney function is similar to what it was recently, looks like he has had an acute kidney injury at the end of July and has not yet had improvement.  Will discuss with cards. Cards to come and eval at bedside.   CRITICAL CARE Performed by: Cecilio Asper   Total critical care time: 35 minutes  Critical care time was exclusive of separately billable procedures and treating other patients.  Critical care was necessary to treat or prevent imminent or life-threatening deterioration.  Critical care was time spent personally by me on the following activities: development of treatment plan with patient and/or surrogate as well as nursing, discussions with consultants, evaluation of patient's response to treatment, examination of patient, obtaining history from patient or surrogate, ordering and performing treatments and interventions, ordering and review of laboratory studies, ordering and review of radiographic studies, pulse oximetry and re-evaluation of patient's condition.  The patients results and plan were reviewed and discussed.   Any x-rays performed were independently reviewed by myself.   Differential diagnosis were considered with the presenting HPI.  Medications - No data to display  Vitals:   01/15/18 0517 01/15/18 0545 01/15/18 0615 01/15/18 0645  BP:  (!) 182/73 (!) 150/77 (!) 142/77  Pulse:  64 60 60  Resp:  16 (!) 22 15  Temp:      TempSrc:      SpO2:  100% 100% 91%  Weight: 57.2 kg     Height: 5\' 6"  (1.676 m)       Final diagnoses:  Neck pain  Chest pain with high risk for cardiac etiology     Final Clinical Impressions(s) / ED Diagnoses   Final diagnoses:  Neck pain  Chest pain with high risk for cardiac etiology    ED Discharge Orders    None       Deno Etienne, DO 01/15/18 0720

## 2018-01-15 NOTE — ED Notes (Signed)
ED Provider at bedside. 

## 2018-01-15 NOTE — ED Notes (Signed)
Report given to 6E RN 

## 2018-01-15 NOTE — ED Notes (Signed)
Cards at bedside

## 2018-01-15 NOTE — ED Triage Notes (Signed)
Patient presents to the ED with complaints of abnormal labs. Patient reports was DX with Endocarditis, dx 3 weeks ago follow- up with cardiology yesterday and advised was having chest pain yesterday, Cardiology did trop called this morning advised lab was elevated. Patient reports chest pain radiating to neck. Patient alert and oriented.

## 2018-01-15 NOTE — ED Provider Notes (Signed)
I received pt in signout from Dr. Tyrone Nine. Pt evaluated by cardiology team. Per their notes, they will admit for heparin drip and future heart catherization.    Ryan [name], Ryan Overland, MD 01/15/18 1340

## 2018-01-16 DIAGNOSIS — R079 Chest pain, unspecified: Secondary | ICD-10-CM

## 2018-01-16 DIAGNOSIS — I200 Unstable angina: Secondary | ICD-10-CM

## 2018-01-16 LAB — CBC
HEMATOCRIT: 33.5 % — AB (ref 39.0–52.0)
HEMOGLOBIN: 10.4 g/dL — AB (ref 13.0–17.0)
MCH: 29.4 pg (ref 26.0–34.0)
MCHC: 31 g/dL (ref 30.0–36.0)
MCV: 94.6 fL (ref 78.0–100.0)
PLATELETS: 84 10*3/uL — AB (ref 150–400)
RBC: 3.54 MIL/uL — ABNORMAL LOW (ref 4.22–5.81)
RDW: 17.9 % — ABNORMAL HIGH (ref 11.5–15.5)
WBC: 5.6 10*3/uL (ref 4.0–10.5)

## 2018-01-16 LAB — BASIC METABOLIC PANEL
ANION GAP: 11 (ref 5–15)
BUN: 37 mg/dL — ABNORMAL HIGH (ref 8–23)
CALCIUM: 8.7 mg/dL — AB (ref 8.9–10.3)
CO2: 21 mmol/L — AB (ref 22–32)
Chloride: 109 mmol/L (ref 98–111)
Creatinine, Ser: 2.54 mg/dL — ABNORMAL HIGH (ref 0.61–1.24)
GFR calc Af Amer: 25 mL/min — ABNORMAL LOW (ref >60)
GFR calc non Af Amer: 22 mL/min — ABNORMAL LOW (ref >60)
GLUCOSE: 84 mg/dL (ref 70–99)
POTASSIUM: 4 mmol/L (ref 3.5–5.1)
Sodium: 141 mmol/L (ref 135–145)

## 2018-01-16 LAB — APTT: APTT: 70 s — AB (ref 24–36)

## 2018-01-16 LAB — HEPARIN LEVEL (UNFRACTIONATED): HEPARIN UNFRACTIONATED: 1.12 [IU]/mL — AB (ref 0.30–0.70)

## 2018-01-16 NOTE — Progress Notes (Signed)
[facility] for heparin Indication: chest pain/ACS  Allergies  Allergen Reactions  . Ciprofloxacin Anaphylaxis  . Hydrochlorothiazide Anaphylaxis  . Sulfa Antibiotics Anaphylaxis  . Ace Inhibitors Cough  . Losartan Cough  . Carvedilol Cough    Pt states it "causes him too cough."  . Lisinopril Cough    Pt reports worsening cough and "feeling wheezy."  . Soy Allergy Nausea And Vomiting    Patient Measurements: Height: 5\' 6"  (167.6 cm) Weight: 125 lb 3.2 oz (56.8 kg) IBW/kg (Calculated) : 63.8 Heparin Dosing Weight: 57.2 Kg  Vital Signs: Temp: 97.6 F (36.4 C) (08/17 0551) Temp Source: Oral (08/17 0551) BP: 171/81 (08/17 0819) Pulse Rate: 79 (08/17 0551)  Labs: Recent Labs    01/14/18 1309 01/15/18 0522 01/15/18 1219 01/15/18 2136 01/16/18 0435  HGB 12.2* 12.1*  --   --  10.4*  HCT 37.8 38.7*  --   --  33.5*  PLT 106* 100*  --   --  84*  APTT  --   --   --  73* 70*  HEPARINUNFRC  --   --   --  1.42* 1.12*  CREATININE 2.45* 2.49*  --   --  2.54*  TROPONINI 0.29*  --  0.10*  --   --     Estimated Creatinine Clearance: 17.1 mL/min (A) (by C-G formula based on SCr of 2.54 mg/dL (H)).   Assessment:  84 y.o. male with elevated cardiac markers, h/o Afib and Eliquis on hold, transitioned to heparin infusion (LD apixaban on 8/15 PM).   Heparin level remains falsely elevated at 1.12, aPTT therapeutic at 70, on 700 units/hr. Hgb down slightly from 12.1 to 10.4, plts down to 84. Trop 0.29>0.1. No s/sx of bleeding. No infusion issues. Continue trending both heparin levels and aPTT until correlate.   Goal of Therapy:  Heparin level 0.3-0.7 units/ml aPTT 66-102 seconds Monitor platelets by anticoagulation protocol: Yes   Plan:  Increase heparin infusion slightly to 750 units/hr to keep in goal range Monitor daily HL, aPTT, CBC, and for s/sx of bleeding F/u plans to eventually transition back to apixaban  Doylene Canard,  PharmD Clinical Pharmacist  Pager: [phone] Phone: [phone]

## 2018-01-16 NOTE — Progress Notes (Signed)
Progress Note  Patient Name: Ryan [name] Date of Encounter: 01/16/2018  Primary Cardiologist: Ena Dawley, MD   Subjective   Doing well today. Has had some shoulder pain but not like the pain he experienced previously.   Inpatient Medications    Scheduled Meds: . [START ON 01/18/2018] aspirin  81 mg Oral Pre-Cath  . aspirin EC  81 mg Oral Daily  . atorvastatin  20 mg Oral q1800  . [START ON 01/18/2018] cholecalciferol  1,000 Units Oral Once per day on Mon Thu  . isosorbide mononitrate  30 mg Oral Daily  . latanoprost  1 drop Both Eyes QHS  . levothyroxine  12.5 mcg Oral QAC breakfast  . metoprolol tartrate  50 mg Oral BID  . mometasone-formoterol  2 puff Inhalation BID  . Netarsudil Dimesylate  1 drop Both Eyes QHS  . sodium chloride flush  3 mL Intravenous Q12H  . vitamin B-12  1,000 mcg Oral Daily   Continuous Infusions: . sodium chloride 50 mL/hr at 01/16/18 0818  . sodium chloride    . [START ON 01/18/2018] sodium chloride     Followed by  . [START ON 01/18/2018] sodium chloride    . heparin 750 Units/hr (01/16/18 1150)   PRN Meds: sodium chloride, acetaminophen, albuterol, nitroGLYCERIN, ondansetron (ZOFRAN) IV, polyethylene glycol, senna, sodium chloride flush, zolpidem   Vital Signs    Vitals:   01/15/18 1946 01/16/18 0551 01/16/18 0819 01/16/18 1132  BP: (!) 167/75 (!) 157/88 (!) 171/81 (!) 153/65  Pulse: 61 79  63  Resp: 13 18  18   Temp: 98.3 F (36.8 C) 97.6 F (36.4 C)  97.7 F (36.5 C)  TempSrc: Oral Oral  Oral  SpO2: 95% 93%  98%  Weight:  56.8 kg    Height:        Intake/Output Summary (Last 24 hours) at 01/16/2018 1319 Last data filed at 01/16/2018 1100 Gross per 24 hour  Intake 839.06 ml  Output 600 ml  Net 239.06 ml   Filed Weights   01/15/18 0517 01/15/18 1539 01/16/18 0551  Weight: 57.2 kg 56.9 kg 56.8 kg    Telemetry    Atrial paced rhythm - Personally Reviewed  ECG    Atrial paced - Personally Reviewed  Physical  Exam   GEN: No acute distress.   Neck: supple, no JVD Cardiac: regular S1 and S2, no murmurs, rubs, or gallops.  Respiratory: Clear to auscultation bilaterally. GI: Soft, nontender, non-distended. Bowel sounds normal MS: No edema; No deformity. Neuro:  Nonfocal, moves all limbs independently Psych: Normal affect   Labs    Chemistry Recent Labs  Lab 01/14/18 1309 01/15/18 0522 01/16/18 0435  NA 142 138 141  K 4.5 4.2 4.0  CL 103 106 109  CO2 23 23 21*  GLUCOSE 89 99 84  BUN 39* 36* 37*  CREATININE 2.45* 2.49* 2.54*  CALCIUM 9.2 9.0 8.7*  GFRNONAA 23* 22* 22*  GFRAA 27* 26* 25*  ANIONGAP  --  9 11     Hematology Recent Labs  Lab 01/14/18 1309 01/15/18 0522 01/16/18 0435  WBC 7.5 7.9 5.6  RBC 4.16 4.06* 3.54*  HGB 12.2* 12.1* 10.4*  HCT 37.8 38.7* 33.5*  MCV 91 95.3 94.6  MCH 29.3 29.8 29.4  MCHC 32.3 31.3 31.0  RDW 18.3* 18.2* 17.9*  PLT 106* 100* 84*    Cardiac Enzymes Recent Labs  Lab 01/14/18 1309 01/15/18 1219  TROPONINI 0.29* 0.10*    Recent Labs  Lab 01/15/18  0547  TROPIPOC 0.10*     BNP Recent Labs  Lab 01/14/18 1309  PROBNP 3,168*     DDimer No results for input(s): DDIMER in the last 168 hours.   Radiology    Dg Chest 2 View  Result Date: 01/15/2018 CLINICAL DATA:  Chest pain. Lab results indicate endocarditis. History of hypertension. Nonsmoker. EXAM: CHEST - 2 VIEW COMPARISON:  11/07/2017 FINDINGS: Cardiac pacemaker. Postoperative changes with valve prosthesis. Heart size and pulmonary vascularity are normal. Emphysematous changes and scattered fibrosis in the lungs. No blunting of costophrenic angles. No pneumothorax. Mediastinal contours appear intact. IMPRESSION: Emphysematous changes and fibrosis in the lungs. No evidence of active pulmonary disease. Electronically Signed   By: Lucienne Capers M.D.   On: 01/15/2018 06:07    Cardiac Studies   No new since admission, prior reviewed  Patient Profile     84 y.o. male with a  history of of CAD s/p PCI/DES to RCA 04/2016, AS s/p TAVR 06/2016, carotid artery disease s/p b/l CEA, chronic combined CHF, SSS s/p PPM, HTN, HLD, paroxysmal atrial fibrillation on eliquis, claudication, and OSA, who presents with elevated troponins.  Assessment & Plan    1. Unstable angina in patient with CAD s/p PCI/DES to RCA 2017: patient presented after an episode of left sided chest pain which was evaluated outpatient and found to have a troponin 0.29 and recommended to present to ED.  - Plan for [facility] on Monday to further evaluate chest pain complaints - Hold eliquis and maintain on heparin gtt  - Will hydrate over the weekend in anticipation of cath on Monday - Continue statin, imdur, and metoprolol - PRN nitro  2. Chronic combined CHF: EF 50-55% on last TEE 11/2017. CXR without edema. He appears euvolemic on exam. Will hydrate over the weekend in anticipation of cath on Monday - Monitor volume status closely this weekend  - Continue metoprolol  3.  Atrial fibrillation: last dose of eliquis was 6pm 8/15.  - Continue metoprolol - Heparin gtt while eliquis is on hold for stroke prevention  4. Recent bioprosthetic aortic valve endocarditis: completed a 7 week course of IV antibiotics 12/22/17. No recent fevers and no leukocytosis on labs this admission. Last TEE without endocarditis but mentions small oscillating fibrinous structures on the pacer leads, not well visualized in additional views.  - BCx from 8/16 NGTD  5. Chronic kidney disease: Cr 2.54 today. Prior to antibiotics course, baseline Cr was around 1.1, however Cr has been >2.4 since 12/30/17. Possible this is his new baseline.  - Given need for LHC, will hydrate over the weekend  - Continue to monitor closely   6. SSS s/p PPM: denies palpitations. Last PPM check last month with normal function and 2% AT/AF burden.  7. HTN:  -continue home medications  8. Claudications: undergoing PVD work-up with Dr. Gwenlyn Found outpatient.  Has an appointment for imaging 8/14m will need to be rescheduled - Continue outpatient follow-up/work-up with Dr. Gwenlyn Found    Time Spent Directly with Patient: I have spent a total of 25 minutes with the patient reviewing hospital notes, telemetry, EKGs, labs and examining the patient as well as establishing an assessment and plan that was discussed personally with the patient.  > 50% of time was spent in direct patient care.  Length of Stay:  LOS: 1 day   Buford Dresser, MD, PhD [facility]  [facility]   01/16/2018, 1:19 PM   For questions or updates, please contact [place] Please consult [email or web address]  for contact info under Cardiology/STEMI.

## 2018-01-16 NOTE — Plan of Care (Signed)
  Problem: Clinical Measurements: Goal: Respiratory complications will improve Outcome: Progressing Note:  No s/s of respiratory complications noted.   Problem: Activity: Goal: Risk for activity intolerance will decrease Outcome: Progressing Note:  Ambulates to bathroom without difficulty.   Problem: Coping: Goal: Level of anxiety will decrease Outcome: Progressing Note:  No s/s of anxiety noted.

## 2018-01-17 DIAGNOSIS — I160 Hypertensive urgency: Secondary | ICD-10-CM

## 2018-01-17 LAB — BASIC METABOLIC PANEL
ANION GAP: 6 (ref 5–15)
BUN: 29 mg/dL — ABNORMAL HIGH (ref 8–23)
CHLORIDE: 111 mmol/L (ref 98–111)
CO2: 26 mmol/L (ref 22–32)
Calcium: 8.9 mg/dL (ref 8.9–10.3)
Creatinine, Ser: 2.38 mg/dL — ABNORMAL HIGH (ref 0.61–1.24)
GFR calc Af Amer: 27 mL/min — ABNORMAL LOW (ref >60)
GFR calc non Af Amer: 23 mL/min — ABNORMAL LOW (ref >60)
GLUCOSE: 104 mg/dL — AB (ref 70–99)
POTASSIUM: 3.7 mmol/L (ref 3.5–5.1)
Sodium: 143 mmol/L (ref 135–145)

## 2018-01-17 LAB — CBC
HCT: 37 % — ABNORMAL LOW (ref 39.0–52.0)
HEMOGLOBIN: 11.8 g/dL — AB (ref 13.0–17.0)
MCH: 29.7 pg (ref 26.0–34.0)
MCHC: 31.9 g/dL (ref 30.0–36.0)
MCV: 93.2 fL (ref 78.0–100.0)
Platelets: 98 10*3/uL — ABNORMAL LOW (ref 150–400)
RBC: 3.97 MIL/uL — AB (ref 4.22–5.81)
RDW: 17.9 % — ABNORMAL HIGH (ref 11.5–15.5)
WBC: 6.4 10*3/uL (ref 4.0–10.5)

## 2018-01-17 LAB — APTT: aPTT: 90 seconds — ABNORMAL HIGH (ref 24–36)

## 2018-01-17 LAB — HEPARIN LEVEL (UNFRACTIONATED): Heparin Unfractionated: 0.96 IU/mL — ABNORMAL HIGH (ref 0.30–0.70)

## 2018-01-17 MED ORDER — METOPROLOL SUCCINATE ER 100 MG PO TB24
100.0000 mg | ORAL_TABLET | Freq: Every day | ORAL | Status: DC
  Administered 2018-01-17 – 2018-01-19 (×3): 100 mg via ORAL
  Filled 2018-01-17 (×3): qty 1

## 2018-01-17 MED ORDER — HYDRALAZINE HCL 10 MG PO TABS
10.0000 mg | ORAL_TABLET | Freq: Three times a day (TID) | ORAL | Status: DC
  Administered 2018-01-17 – 2018-01-18 (×4): 10 mg via ORAL
  Filled 2018-01-17 (×5): qty 1

## 2018-01-17 MED ORDER — AMLODIPINE BESYLATE 5 MG PO TABS
5.0000 mg | ORAL_TABLET | Freq: Every day | ORAL | Status: DC
  Administered 2018-01-17 – 2018-01-19 (×3): 5 mg via ORAL
  Filled 2018-01-17 (×3): qty 1

## 2018-01-17 MED ORDER — ISOSORBIDE MONONITRATE ER 60 MG PO TB24
60.0000 mg | ORAL_TABLET | Freq: Every day | ORAL | Status: DC
  Administered 2018-01-17 – 2018-01-19 (×3): 60 mg via ORAL
  Filled 2018-01-17 (×3): qty 1

## 2018-01-17 NOTE — Progress Notes (Signed)
[place] for heparin Indication: chest pain/ACS  Allergies  Allergen Reactions  . Ciprofloxacin Anaphylaxis  . Hydrochlorothiazide Anaphylaxis  . Sulfa Antibiotics Anaphylaxis  . Ace Inhibitors Cough  . Losartan Cough  . Carvedilol Cough    Pt states it "causes him too cough."  . Lisinopril Cough    Pt reports worsening cough and "feeling wheezy."  . Soy Allergy Nausea And Vomiting    Patient Measurements: Height: 5\' 6"  (167.6 cm) Weight: 125 lb 14.4 oz (57.1 kg) IBW/kg (Calculated) : 63.8 Heparin Dosing Weight: 57.2 Kg  Vital Signs: Temp: 97.4 F (36.3 C) (08/18 0511) Temp Source: Oral (08/18 0511) BP: 186/81 (08/18 0743) Pulse Rate: 66 (08/18 0511)  Labs: Recent Labs    01/14/18 1309  01/15/18 0522 01/15/18 1219 01/15/18 2136 01/16/18 0435 01/17/18 0358 01/17/18 0855  HGB 12.2*   < > 12.1*  --   --  10.4* 11.8*  --   HCT 37.8  --  38.7*  --   --  33.5* 37.0*  --   PLT 106*  --  100*  --   --  84* 98*  --   APTT  --   --   --   --  73* 70* 90*  --   HEPARINUNFRC  --   --   --   --  1.42* 1.12* 0.96*  --   CREATININE 2.45*  --  2.49*  --   --  2.54*  --  2.38*  TROPONINI 0.29*  --   --  0.10*  --   --   --   --    < > = values in this interval not displayed.    Estimated Creatinine Clearance: 18.3 mL/min (A) (by C-G formula based on SCr of 2.38 mg/dL (H)).   Assessment:  84 y.o. male with elevated cardiac markers, h/o Afib and Eliquis on hold, transitioned to heparin infusion (LD apixaban on 8/15 PM).   Heparin level continues to remain falsely elevated at 0.96, aPTT is therapeutic at 90, on 750 units/hr. Hgb 11.8, plt 98. No s/sx of bleeding. No infusion issues. Continue trending both heparin levels and aPTT until correlate.   Goal of Therapy:  Heparin level 0.3-0.7 units/ml aPTT 66-102 seconds Monitor platelets by anticoagulation protocol: Yes   Plan:  Continue heparin infusion at 750 units/hr  Monitor daily  HL, aPTT, CBC, and for s/sx of bleeding F/u plans to eventually transition back to apixaban  Doylene Canard, PharmD Clinical Pharmacist  Pager: [phone] Phone: [phone]

## 2018-01-17 NOTE — Plan of Care (Signed)
  Problem: Clinical Measurements: Goal: Respiratory complications will improve Outcome: Progressing   Problem: Clinical Measurements: Goal: Cardiovascular complication will be avoided Outcome: Progressing   Problem: Safety: Goal: Ability to remain free from injury will improve Outcome: Not Progressing

## 2018-01-17 NOTE — H&P (View-Only) (Signed)
Progress Note  Patient Name: Ryan [name] Date of Encounter: 01/17/2018  Primary Cardiologist: Ryan Dawley, MD   Subjective   Doing well today. Fell last night, got tripped up in the wires/cords of his IV pole. Fell on his hands, did not hit his head.   His blood pressures here continue to be elevated even with medication changes. He and his wife note labile blood pressures at home, 120s-180s to occasionally 465 systolic. He has had issues with many medications in the past, including carvedilol. He was on amlodipine in the past and was stopped due to med changes, not because of intolerance.  Feeling well, no headaches, no chest pain.   Inpatient Medications    Scheduled Meds: . amLODipine  5 mg Oral Daily  . [START ON 01/18/2018] aspirin  81 mg Oral Pre-Cath  . aspirin EC  81 mg Oral Daily  . atorvastatin  20 mg Oral q1800  . [START ON 01/18/2018] cholecalciferol  1,000 Units Oral Once per day on Mon Thu  . hydrALAZINE  10 mg Oral Q8H  . isosorbide mononitrate  60 mg Oral Daily  . latanoprost  1 drop Both Eyes QHS  . levothyroxine  12.5 mcg Oral QAC breakfast  . metoprolol succinate  100 mg Oral Daily  . mometasone-formoterol  2 puff Inhalation BID  . Netarsudil Dimesylate  1 drop Both Eyes QHS  . sodium chloride flush  3 mL Intravenous Q12H  . vitamin B-12  1,000 mcg Oral Daily   Continuous Infusions: . sodium chloride 50 mL/hr at 01/17/18 0225  . sodium chloride    . [START ON 01/18/2018] sodium chloride     Followed by  . [START ON 01/18/2018] sodium chloride    . heparin 750 Units/hr (01/16/18 2206)   PRN Meds: sodium chloride, acetaminophen, albuterol, nitroGLYCERIN, ondansetron (ZOFRAN) IV, polyethylene glycol, senna, sodium chloride flush, zolpidem   Vital Signs    Vitals:   01/17/18 0511 01/17/18 0512 01/17/18 0743 01/17/18 0858  BP: (!) 174/71  (!) 186/81   Pulse: 66     Resp:      Temp: (!) 97.4 F (36.3 C)     TempSrc: Oral     SpO2: 97%   96%    Weight:  57.1 kg    Height:        Intake/Output Summary (Last 24 hours) at 01/17/2018 0937 Last data filed at 01/17/2018 0513 Gross per 24 hour  Intake 2382.36 ml  Output 700 ml  Net 1682.36 ml   Filed Weights   01/15/18 1539 01/16/18 0551 01/17/18 0512  Weight: 56.9 kg 56.8 kg 57.1 kg    Telemetry    Atrial paced rhythm - Personally Reviewed  ECG    Atrial paced - Personally Reviewed  Physical Exam   GEN: No acute distress.   Neck: supple, no JVD Cardiac: regular S1 and S2, no murmurs, rubs, or gallops.  Respiratory: Clear to auscultation bilaterally. GI: Soft, nontender, non-distended. Bowel sounds normal MS: No edema; No deformity. Neuro:  Nonfocal, moves all limbs independently Psych: Normal affect   Labs    Chemistry Recent Labs  Lab 01/14/18 1309 01/15/18 0522 01/16/18 0435  NA 142 138 141  K 4.5 4.2 4.0  CL 103 106 109  CO2 23 23 21*  GLUCOSE 89 99 84  BUN 39* 36* 37*  CREATININE 2.45* 2.49* 2.54*  CALCIUM 9.2 9.0 8.7*  GFRNONAA 23* 22* 22*  GFRAA 27* 26* 25*  ANIONGAP  --  9  11     Hematology Recent Labs  Lab 01/15/18 0522 01/16/18 0435 01/17/18 0358  WBC 7.9 5.6 6.4  RBC 4.06* 3.54* 3.97*  HGB 12.1* 10.4* 11.8*  HCT 38.7* 33.5* 37.0*  MCV 95.3 94.6 93.2  MCH 29.8 29.4 29.7  MCHC 31.3 31.0 31.9  RDW 18.2* 17.9* 17.9*  PLT 100* 84* 98*    Cardiac Enzymes Recent Labs  Lab 01/14/18 1309 01/15/18 1219  TROPONINI 0.29* 0.10*    Recent Labs  Lab 01/15/18 0547  TROPIPOC 0.10*     BNP Recent Labs  Lab 01/14/18 1309  PROBNP 3,168*     DDimer No results for input(s): DDIMER in the last 168 hours.   Radiology    No results found.  Cardiac Studies   No new since admission, prior reviewed  Patient Profile     84 y.o. male with a history of of CAD s/p PCI/DES to RCA 04/2016, AS s/p TAVR 06/2016, carotid artery disease s/p b/l CEA, chronic combined CHF, SSS s/p PPM, HTN, HLD, paroxysmal atrial fibrillation on eliquis,  claudication, and OSA, who presents with elevated troponins.  Assessment & Plan    1. Unstable angina in patient with CAD s/p PCI/DES to RCA 2017: patient presented after an episode of left sided chest pain which was evaluated outpatient and found to have a troponin 0.29 and recommended to present to ED.  - Plan for [facility] tomorrow to further evaluate chest pain complaints - Hold eliquis and maintain on heparin gtt  - Will hydrate over the weekend in anticipation of cath - Continue statin (increased dose from 20 to 40), imdur (increased), and metoprolol - PRN nitro  Risks and benefits of cardiac catheterization have been discussed with the patient.  These include bleeding, infection, kidney damage, stroke, heart attack, death.  The patient understands these risks and is willing to proceed.   2. Chronic combined systolic and diastolic heart failure: No acute changes. EF 50-55% on last TEE 11/2017, but had been as low as 35-40% earlier this year. CXR without edema. He appears euvolemic on exam. Will hydrate over the weekend in anticipation of cath on Monday - Monitor volume status closely this weekend  - Continue metoprolol - renal function precludes ACEI/ARB or spironolactone  3.  Atrial fibrillation: last dose of eliquis was 6pm 8/15.  - Continue metoprolol - Heparin gtt while eliquis is on hold for stroke prevention  4. Recent bioprosthetic aortic valve endocarditis: completed a 7 week course of IV antibiotics 12/22/17. No recent fevers and no leukocytosis on labs this admission. Last TEE without endocarditis but mentions small oscillating fibrinous structures on the pacer leads, not well visualized in additional views.  - BCx from 8/16 NGTD  5. Chronic kidney disease: Prior to antibiotics course, baseline Cr was around 1.1, however Cr has been >2.4 since 12/30/17. Possible this is his new baseline.  - Given need for LHC, will hydrate - Continue to monitor closely   6. SSS s/p PPM:  denies palpitations. Last PPM check last month with normal function and 2% AT/AF burden.  7. HTN:  -increasing medications. Increasing imdur, adding amlodipine. Also adding PRN hydralazing with hold parameters. Given that he is anticoagulated and pending cath, would like tighter control of BP.  8. Claudications: undergoing PVD work-up with Dr. Gwenlyn Found outpatient. Has an appointment for imaging 8/67m will need to be rescheduled - Continue outpatient follow-up/work-up with Dr. Gwenlyn Found    Time Spent Directly with Patient: I have spent a total of 25  minutes with the patient reviewing hospital notes, telemetry, EKGs, labs and examining the patient as well as establishing an assessment and plan that was discussed personally with the patient.  > 50% of time was spent in direct patient care.  Length of Stay:  LOS: 2 days   Buford Dresser, MD, PhD [facility]  [facility]   01/17/2018, 9:37 AM   For questions or updates, please contact [place] Please consult [email or web address] for contact info under Cardiology/STEMI.

## 2018-01-17 NOTE — Progress Notes (Signed)
Progress Note  Patient Name: Ryan [name] Date of Encounter: 01/17/2018  Primary Cardiologist: Ena Dawley, MD   Subjective   Doing well today. Fell last night, got tripped up in the wires/cords of his IV pole. Fell on his hands, did not hit his head.   His blood pressures here continue to be elevated even with medication changes. He and his wife note labile blood pressures at home, 120s-180s to occasionally 903 systolic. He has had issues with many medications in the past, including carvedilol. He was on amlodipine in the past and was stopped due to med changes, not because of intolerance.  Feeling well, no headaches, no chest pain.   Inpatient Medications    Scheduled Meds: . amLODipine  5 mg Oral Daily  . [START ON 01/18/2018] aspirin  81 mg Oral Pre-Cath  . aspirin EC  81 mg Oral Daily  . atorvastatin  20 mg Oral q1800  . [START ON 01/18/2018] cholecalciferol  1,000 Units Oral Once per day on Mon Thu  . hydrALAZINE  10 mg Oral Q8H  . isosorbide mononitrate  60 mg Oral Daily  . latanoprost  1 drop Both Eyes QHS  . levothyroxine  12.5 mcg Oral QAC breakfast  . metoprolol succinate  100 mg Oral Daily  . mometasone-formoterol  2 puff Inhalation BID  . Netarsudil Dimesylate  1 drop Both Eyes QHS  . sodium chloride flush  3 mL Intravenous Q12H  . vitamin B-12  1,000 mcg Oral Daily   Continuous Infusions: . sodium chloride 50 mL/hr at 01/17/18 0225  . sodium chloride    . [START ON 01/18/2018] sodium chloride     Followed by  . [START ON 01/18/2018] sodium chloride    . heparin 750 Units/hr (01/16/18 2206)   PRN Meds: sodium chloride, acetaminophen, albuterol, nitroGLYCERIN, ondansetron (ZOFRAN) IV, polyethylene glycol, senna, sodium chloride flush, zolpidem   Vital Signs    Vitals:   01/17/18 0511 01/17/18 0512 01/17/18 0743 01/17/18 0858  BP: (!) 174/71  (!) 186/81   Pulse: 66     Resp:      Temp: (!) 97.4 F (36.3 C)     TempSrc: Oral     SpO2: 97%   96%    Weight:  57.1 kg    Height:        Intake/Output Summary (Last 24 hours) at 01/17/2018 0937 Last data filed at 01/17/2018 0513 Gross per 24 hour  Intake 2382.36 ml  Output 700 ml  Net 1682.36 ml   Filed Weights   01/15/18 1539 01/16/18 0551 01/17/18 0512  Weight: 56.9 kg 56.8 kg 57.1 kg    Telemetry    Atrial paced rhythm - Personally Reviewed  ECG    Atrial paced - Personally Reviewed  Physical Exam   GEN: No acute distress.   Neck: supple, no JVD Cardiac: regular S1 and S2, no murmurs, rubs, or gallops.  Respiratory: Clear to auscultation bilaterally. GI: Soft, nontender, non-distended. Bowel sounds normal MS: No edema; No deformity. Neuro:  Nonfocal, moves all limbs independently Psych: Normal affect   Labs    Chemistry Recent Labs  Lab 01/14/18 1309 01/15/18 0522 01/16/18 0435  NA 142 138 141  K 4.5 4.2 4.0  CL 103 106 109  CO2 23 23 21*  GLUCOSE 89 99 84  BUN 39* 36* 37*  CREATININE 2.45* 2.49* 2.54*  CALCIUM 9.2 9.0 8.7*  GFRNONAA 23* 22* 22*  GFRAA 27* 26* 25*  ANIONGAP  --  9  11     Hematology Recent Labs  Lab 01/15/18 0522 01/16/18 0435 01/17/18 0358  WBC 7.9 5.6 6.4  RBC 4.06* 3.54* 3.97*  HGB 12.1* 10.4* 11.8*  HCT 38.7* 33.5* 37.0*  MCV 95.3 94.6 93.2  MCH 29.8 29.4 29.7  MCHC 31.3 31.0 31.9  RDW 18.2* 17.9* 17.9*  PLT 100* 84* 98*    Cardiac Enzymes Recent Labs  Lab 01/14/18 1309 01/15/18 1219  TROPONINI 0.29* 0.10*    Recent Labs  Lab 01/15/18 0547  TROPIPOC 0.10*     BNP Recent Labs  Lab 01/14/18 1309  PROBNP 3,168*     DDimer No results for input(s): DDIMER in the last 168 hours.   Radiology    No results found.  Cardiac Studies   No new since admission, prior reviewed  Patient Profile     84 y.o. male with a history of of CAD s/p PCI/DES to RCA 04/2016, AS s/p TAVR 06/2016, carotid artery disease s/p b/l CEA, chronic combined CHF, SSS s/p PPM, HTN, HLD, paroxysmal atrial fibrillation on eliquis,  claudication, and OSA, who presents with elevated troponins.  Assessment & Plan    1. Unstable angina in patient with CAD s/p PCI/DES to RCA 2017: patient presented after an episode of left sided chest pain which was evaluated outpatient and found to have a troponin 0.29 and recommended to present to ED.  - Plan for [facility] tomorrow to further evaluate chest pain complaints - Hold eliquis and maintain on heparin gtt  - Will hydrate over the weekend in anticipation of cath - Continue statin (increased dose from 20 to 40), imdur (increased), and metoprolol - PRN nitro  Risks and benefits of cardiac catheterization have been discussed with the patient.  These include bleeding, infection, kidney damage, stroke, heart attack, death.  The patient understands these risks and is willing to proceed.   2. Chronic combined systolic and diastolic heart failure: No acute changes. EF 50-55% on last TEE 11/2017, but had been as low as 35-40% earlier this year. CXR without edema. He appears euvolemic on exam. Will hydrate over the weekend in anticipation of cath on Monday - Monitor volume status closely this weekend  - Continue metoprolol - renal function precludes ACEI/ARB or spironolactone  3.  Atrial fibrillation: last dose of eliquis was 6pm 8/15.  - Continue metoprolol - Heparin gtt while eliquis is on hold for stroke prevention  4. Recent bioprosthetic aortic valve endocarditis: completed a 7 week course of IV antibiotics 12/22/17. No recent fevers and no leukocytosis on labs this admission. Last TEE without endocarditis but mentions small oscillating fibrinous structures on the pacer leads, not well visualized in additional views.  - BCx from 8/16 NGTD  5. Chronic kidney disease: Prior to antibiotics course, baseline Cr was around 1.1, however Cr has been >2.4 since 12/30/17. Possible this is his new baseline.  - Given need for LHC, will hydrate - Continue to monitor closely   6. SSS s/p PPM:  denies palpitations. Last PPM check last month with normal function and 2% AT/AF burden.  7. HTN:  -increasing medications. Increasing imdur, adding amlodipine. Also adding PRN hydralazing with hold parameters. Given that he is anticoagulated and pending cath, would like tighter control of BP.  8. Claudications: undergoing PVD work-up with Dr. Gwenlyn Found outpatient. Has an appointment for imaging 8/43m will need to be rescheduled - Continue outpatient follow-up/work-up with Dr. Gwenlyn Found    Time Spent Directly with Patient: I have spent a total of 25  minutes with the patient reviewing hospital notes, telemetry, EKGs, labs and examining the patient as well as establishing an assessment and plan that was discussed personally with the patient.  > 50% of time was spent in direct patient care.  Length of Stay:  LOS: 2 days   Buford Dresser, MD, PhD [facility]  [facility]   01/17/2018, 9:37 AM   For questions or updates, please contact [place] Please consult [email or web address] for contact info under Cardiology/STEMI.

## 2018-01-18 ENCOUNTER — Encounter (HOSPITAL_COMMUNITY)
Admission: EM | Disposition: A | Discharge status: Home / Self Care | Payer: Self-pay | Source: Ambulatory Visit | Attending: Cardiovascular Disease

## 2018-01-18 ENCOUNTER — Encounter (HOSPITAL_COMMUNITY): Payer: Self-pay | Admitting: Cardiovascular Disease

## 2018-01-18 DIAGNOSIS — I4891 Unspecified atrial fibrillation: Secondary | ICD-10-CM

## 2018-01-18 HISTORY — PX: CORONARY ANGIOGRAPHY: CATH118303

## 2018-01-18 LAB — HEPARIN LEVEL (UNFRACTIONATED): HEPARIN UNFRACTIONATED: 0.87 [IU]/mL — AB (ref 0.30–0.70)

## 2018-01-18 LAB — BASIC METABOLIC PANEL
ANION GAP: 6 (ref 5–15)
BUN: 26 mg/dL — ABNORMAL HIGH (ref 8–23)
CHLORIDE: 113 mmol/L — AB (ref 98–111)
CO2: 24 mmol/L (ref 22–32)
Calcium: 9 mg/dL (ref 8.9–10.3)
Creatinine, Ser: 2.48 mg/dL — ABNORMAL HIGH (ref 0.61–1.24)
GFR calc non Af Amer: 22 mL/min — ABNORMAL LOW (ref >60)
GFR, EST AFRICAN AMERICAN: 26 mL/min — AB (ref >60)
GLUCOSE: 99 mg/dL (ref 70–99)
POTASSIUM: 4 mmol/L (ref 3.5–5.1)
Sodium: 143 mmol/L (ref 135–145)

## 2018-01-18 LAB — CBC
HEMATOCRIT: 35.9 % — AB (ref 39.0–52.0)
Hemoglobin: 11.3 g/dL — ABNORMAL LOW (ref 13.0–17.0)
MCH: 29.8 pg (ref 26.0–34.0)
MCHC: 31.5 g/dL (ref 30.0–36.0)
MCV: 94.7 fL (ref 78.0–100.0)
Platelets: 113 10*3/uL — ABNORMAL LOW (ref 150–400)
RBC: 3.79 MIL/uL — AB (ref 4.22–5.81)
RDW: 18.2 % — ABNORMAL HIGH (ref 11.5–15.5)
WBC: 6.6 10*3/uL (ref 4.0–10.5)

## 2018-01-18 LAB — APTT: aPTT: 76 seconds — ABNORMAL HIGH (ref 24–36)

## 2018-01-18 LAB — POCT ACTIVATED CLOTTING TIME: ACTIVATED CLOTTING TIME: 109 s

## 2018-01-18 SURGERY — CORONARY ANGIOGRAPHY (CATH LAB)
Anesthesia: LOCAL

## 2018-01-18 MED ORDER — MIDAZOLAM HCL 2 MG/2ML IJ SOLN
INTRAMUSCULAR | Status: DC | PRN
  Administered 2018-01-18: 1 mg via INTRAVENOUS

## 2018-01-18 MED ORDER — IOPAMIDOL (ISOVUE-370) INJECTION 76%
INTRAVENOUS | Status: DC | PRN
  Administered 2018-01-18: 80 mL via INTRA_ARTERIAL

## 2018-01-18 MED ORDER — SODIUM CHLORIDE 0.9 % IV SOLN
INTRAVENOUS | Status: AC
  Administered 2018-01-18: 14:00:00 via INTRAVENOUS

## 2018-01-18 MED ORDER — SODIUM CHLORIDE 0.9% FLUSH: 3.0000 mL | INTRAVENOUS | Status: DC | PRN

## 2018-01-18 MED ORDER — MIDAZOLAM HCL 2 MG/2ML IJ SOLN
INTRAMUSCULAR | Status: AC
  Filled 2018-01-18: qty 2

## 2018-01-18 MED ORDER — HEPARIN (PORCINE) IN NACL 1000-0.9 UT/500ML-% IV SOLN
INTRAVENOUS | Status: AC
  Filled 2018-01-18: qty 1000

## 2018-01-18 MED ORDER — IOPAMIDOL (ISOVUE-370) INJECTION 76%
INTRAVENOUS | Status: AC
  Filled 2018-01-18: qty 100

## 2018-01-18 MED ORDER — HEPARIN (PORCINE) IN NACL 1000-0.9 UT/500ML-% IV SOLN
INTRAVENOUS | Status: DC | PRN
  Administered 2018-01-18: 500 mL

## 2018-01-18 MED ORDER — MORPHINE SULFATE (PF) 2 MG/ML IV SOLN: 2.0000 mg | INTRAVENOUS | Status: DC | PRN

## 2018-01-18 MED ORDER — ACETAMINOPHEN 325 MG PO TABS: 650.0000 mg | ORAL_TABLET | ORAL | Status: DC | PRN

## 2018-01-18 MED ORDER — FENTANYL CITRATE (PF) 100 MCG/2ML IJ SOLN
INTRAMUSCULAR | Status: AC
  Filled 2018-01-18: qty 2

## 2018-01-18 MED ORDER — ONDANSETRON HCL 4 MG/2ML IJ SOLN: 4.0000 mg | Freq: Four times a day (QID) | INTRAMUSCULAR | Status: DC | PRN

## 2018-01-18 MED ORDER — FENTANYL CITRATE (PF) 100 MCG/2ML IJ SOLN
INTRAMUSCULAR | Status: DC | PRN
  Administered 2018-01-18: 25 ug via INTRAVENOUS

## 2018-01-18 MED ORDER — LIDOCAINE HCL (PF) 1 % IJ SOLN
INTRAMUSCULAR | Status: AC
  Filled 2018-01-18: qty 30

## 2018-01-18 MED ORDER — SODIUM CHLORIDE 0.9% FLUSH: 3.0000 mL | Freq: Two times a day (BID) | INTRAVENOUS | Status: DC

## 2018-01-18 MED ORDER — SODIUM CHLORIDE 0.9 % IV SOLN: 250.0000 mL | INTRAVENOUS | Status: DC | PRN

## 2018-01-18 MED ORDER — LIDOCAINE HCL (PF) 1 % IJ SOLN
INTRAMUSCULAR | Status: DC | PRN
  Administered 2018-01-18: 15 mL

## 2018-01-18 SURGICAL SUPPLY — 7 items
CATH INFINITI 5FR MULTPACK ANG (CATHETERS) ×2 IMPLANT
KIT HEART LEFT (KITS) ×2 IMPLANT
PACK CARDIAC CATHETERIZATION (CUSTOM PROCEDURE TRAY) ×2 IMPLANT
SHEATH PINNACLE 5F 10CM (SHEATH) ×2 IMPLANT
TRANSDUCER W/STOPCOCK (MISCELLANEOUS) ×2 IMPLANT
TUBING CIL FLEX 10 FLL-RA (TUBING) ×2 IMPLANT
WIRE EMERALD 3MM-J .035X150CM (WIRE) ×2 IMPLANT

## 2018-01-18 NOTE — Progress Notes (Signed)
Progress Note  Patient Name: Ryan [name] Date of Encounter: 01/18/2018  Primary Cardiologist: Ena Dawley, MD   Subjective   Seen post cath. Doing well. Understands results of cath, no additional questions or concerns.  Inpatient Medications    Scheduled Meds: . amLODipine  5 mg Oral Daily  . aspirin EC  81 mg Oral Daily  . atorvastatin  20 mg Oral q1800  . cholecalciferol  1,000 Units Oral Once per day on Mon Thu  . hydrALAZINE  10 mg Oral Q8H  . isosorbide mononitrate  60 mg Oral Daily  . latanoprost  1 drop Both Eyes QHS  . levothyroxine  12.5 mcg Oral QAC breakfast  . metoprolol succinate  100 mg Oral Daily  . mometasone-formoterol  2 puff Inhalation BID  . Netarsudil Dimesylate  1 drop Both Eyes QHS  . sodium chloride flush  3 mL Intravenous Q12H  . vitamin B-12  1,000 mcg Oral Daily   Continuous Infusions: . sodium chloride 50 mL/hr at 01/17/18 2106  . sodium chloride 50 mL/hr at 01/18/18 1426  . sodium chloride     PRN Meds: sodium chloride, acetaminophen, acetaminophen, albuterol, morphine injection, nitroGLYCERIN, ondansetron (ZOFRAN) IV, ondansetron (ZOFRAN) IV, polyethylene glycol, senna, sodium chloride flush, zolpidem   Vital Signs    Vitals:   01/18/18 1110 01/18/18 1321 01/18/18 1420 01/18/18 1421  BP: (!) 142/65 106/70 (!) 123/57 116/60  Pulse: (!) 59  60   Resp: 12  18   Temp:   97.8 F (36.6 C)   TempSrc:   Oral   SpO2: 94%  96%   Weight:      Height:        Intake/Output Summary (Last 24 hours) at 01/18/2018 1659 Last data filed at 01/18/2018 1400 Gross per 24 hour  Intake 1860.85 ml  Output 600 ml  Net 1260.85 ml   Filed Weights   01/16/18 0551 01/17/18 0512 01/18/18 0423  Weight: 56.8 kg 57.1 kg 57.8 kg    Telemetry    Atrial paced rhythm - Personally Reviewed  ECG    Atrial paced - Personally Reviewed  Physical Exam   GEN: No acute distress.   Neck: supple, no JVD Cardiac: regular S1 and S2, no murmurs, rubs,  or gallops.  Respiratory: Clear to auscultation bilaterally. GI: Soft, nontender, non-distended. Bowel sounds normal MS: No edema; No deformity. Neuro:  Nonfocal, moves all limbs independently Psych: Normal affect   Labs    Chemistry Recent Labs  Lab 01/16/18 0435 01/17/18 0855 01/18/18 0546  NA 141 143 143  K 4.0 3.7 4.0  CL 109 111 113*  CO2 21* 26 24  GLUCOSE 84 104* 99  BUN 37* 29* 26*  CREATININE 2.54* 2.38* 2.48*  CALCIUM 8.7* 8.9 9.0  GFRNONAA 22* 23* 22*  GFRAA 25* 27* 26*  ANIONGAP 11 6 6      Hematology Recent Labs  Lab 01/16/18 0435 01/17/18 0358 01/18/18 0546  WBC 5.6 6.4 6.6  RBC 3.54* 3.97* 3.79*  HGB 10.4* 11.8* 11.3*  HCT 33.5* 37.0* 35.9*  MCV 94.6 93.2 94.7  MCH 29.4 29.7 29.8  MCHC 31.0 31.9 31.5  RDW 17.9* 17.9* 18.2*  PLT 84* 98* 113*    Cardiac Enzymes Recent Labs  Lab 01/14/18 1309 01/15/18 1219  TROPONINI 0.29* 0.10*    Recent Labs  Lab 01/15/18 0547  TROPIPOC 0.10*     BNP Recent Labs  Lab 01/14/18 1309  PROBNP 3,168*     DDimer No results  for input(s): DDIMER in the last 168 hours.   Radiology    No results found.  Cardiac Studies   Cath 02/18/18 IMPRESSION: Mr. Ploeger mid RCA stent is widely patent.  He does have a 40% calcified proximal RCA stenosis on the first bend which does not appear to be obstructive nor different from his prior cath.  The remainder of his anatomy is unchanged.  The etiology of his chest pain is not supported by any change in his coronary anatomy.  Medical therapy is recommended.  His sheath will be removed and pressure held.  The patient left the lab in stable condition.  Patient Profile     84 y.o. male with a history of of CAD s/p PCI/DES to RCA 04/2016, AS s/p TAVR 06/2016, carotid artery disease s/p b/l CEA, chronic combined CHF, SSS s/p PPM, HTN, HLD, paroxysmal atrial fibrillation on eliquis, claudication, and OSA, who presents with elevated troponins.  Assessment & Plan    1.  Unstable angina in patient with CAD s/p PCI/DES to RCA 2017: patient presented after an episode of left sided chest pain which was evaluated outpatient and found to have a troponin 0.29 and recommended to present to ED.  - S/P LHC today without change in anatomy - femoral cath, on bedrest, receiving IV fluids. Will restart apixaban tomorrow. - Continue statin (increased dose from 20 to 40), imdur (increased), and metoprolol - PRN nitro   2. Chronic combined systolic and diastolic heart failure: No acute changes. EF 50-55% on last TEE 11/2017, but had been as low as 35-40% earlier this year. CXR without edema. He appears euvolemic on exam. Will hydrate over the weekend in anticipation of cath on Monday - Continue metoprolol - renal function precludes ACEI/ARB or spironolactone  3.  Atrial fibrillation: last dose of eliquis was 6pm 8/15.  - Continue metoprolol - will restart apixaban in AM  4. Recent bioprosthetic aortic valve endocarditis: completed a 7 week course of IV antibiotics 12/22/17. No recent fevers and no leukocytosis on labs this admission. Last TEE without endocarditis but mentions small oscillating fibrinous structures on the pacer leads, not well visualized in additional views.  - BCx from 8/16 NGTD  5. Chronic kidney disease: Prior to antibiotics course, baseline Cr was around 1.1, however Cr has been >2.4 since 12/30/17. Possible this is his new baseline.  - hydration post cath - Continue to monitor closely   6. SSS s/p PPM: denies palpitations. Last PPM check last month with normal function and 2% AT/AF burden.  7. HTN:  -increasing medications. Increasing imdur, adding amlodipine. Also adding PRN hydralazing with hold parameters. Given that he is anticoagulated and pending cath, would like tighter control of BP.  8. Claudications: undergoing PVD work-up with Dr. Gwenlyn Found outpatient. Has an appointment for imaging 8/48m will need to be rescheduled - Continue outpatient  follow-up/work-up with Dr. Gwenlyn Found    Time Spent Directly with Patient: I have spent a total of 25 minutes with the patient reviewing hospital notes, telemetry, EKGs, labs and examining the patient as well as establishing an assessment and plan that was discussed personally with the patient.  > 50% of time was spent in direct patient care.  Length of Stay:  LOS: 3 days   Buford Dresser, MD, PhD [facility]  [facility]   01/18/2018, 4:59 PM   For questions or updates, please contact [place] Please consult [email or web address] for contact info under Cardiology/STEMI.

## 2018-01-18 NOTE — Interval H&P Note (Signed)
Cath Lab Visit (complete for each Cath Lab visit)  Clinical Evaluation Leading to the Procedure:   ACS: Yes.    Non-ACS:    Anginal Classification: CCS III  Anti-ischemic medical therapy: Maximal Therapy (2 or more classes of medications)  Non-Invasive Test Results: No non-invasive testing performed  Prior CABG: No previous CABG      History and Physical Interval Note:  01/18/2018 10:03 AM  Ryan [name]  has presented today for surgery, with the diagnosis of UA  The various methods of treatment have been discussed with the patient and family. After consideration of risks, benefits and other options for treatment, the patient has consented to  Procedure(s): LEFT HEART CATH AND CORONARY ANGIOGRAPHY (N/A) as a surgical intervention .  The patient's history has been reviewed, patient examined, no change in status, stable for surgery.  I have reviewed the patient's chart and labs.  Questions were answered to the patient's satisfaction.     Quay Burow

## 2018-01-19 ENCOUNTER — Encounter (HOSPITAL_COMMUNITY): Payer: Medicare Other

## 2018-01-19 ENCOUNTER — Telehealth: Payer: Self-pay | Admitting: Cardiology

## 2018-01-19 DIAGNOSIS — I5042 Chronic combined systolic (congestive) and diastolic (congestive) heart failure: Secondary | ICD-10-CM

## 2018-01-19 DIAGNOSIS — I2510 Atherosclerotic heart disease of native coronary artery without angina pectoris: Secondary | ICD-10-CM

## 2018-01-19 LAB — APTT: aPTT: 31 seconds (ref 24–36)

## 2018-01-19 LAB — BASIC METABOLIC PANEL
Anion gap: 5 (ref 5–15)
BUN: 24 mg/dL — AB (ref 8–23)
CHLORIDE: 114 mmol/L — AB (ref 98–111)
CO2: 22 mmol/L (ref 22–32)
Calcium: 8.5 mg/dL — ABNORMAL LOW (ref 8.9–10.3)
Creatinine, Ser: 2.54 mg/dL — ABNORMAL HIGH (ref 0.61–1.24)
GFR calc Af Amer: 25 mL/min — ABNORMAL LOW (ref >60)
GFR calc non Af Amer: 22 mL/min — ABNORMAL LOW (ref >60)
GLUCOSE: 93 mg/dL (ref 70–99)
POTASSIUM: 4 mmol/L (ref 3.5–5.1)
Sodium: 141 mmol/L (ref 135–145)

## 2018-01-19 LAB — CBC
HEMATOCRIT: 31.7 % — AB (ref 39.0–52.0)
HEMOGLOBIN: 9.9 g/dL — AB (ref 13.0–17.0)
MCH: 29.6 pg (ref 26.0–34.0)
MCHC: 31.2 g/dL (ref 30.0–36.0)
MCV: 94.9 fL (ref 78.0–100.0)
Platelets: 100 10*3/uL — ABNORMAL LOW (ref 150–400)
RBC: 3.34 MIL/uL — ABNORMAL LOW (ref 4.22–5.81)
RDW: 18.2 % — ABNORMAL HIGH (ref 11.5–15.5)
WBC: 5.1 10*3/uL (ref 4.0–10.5)

## 2018-01-19 MED ORDER — ATORVASTATIN CALCIUM 40 MG PO TABS: 40.0000 mg | ORAL_TABLET | Freq: Every day | ORAL | 3 refills | Status: DC

## 2018-01-19 MED ORDER — NITROGLYCERIN 0.4 MG SL SUBL: 0.4000 mg | SUBLINGUAL_TABLET | SUBLINGUAL | 3 refills | Status: DC | PRN

## 2018-01-19 MED ORDER — ISOSORBIDE MONONITRATE ER 60 MG PO TB24: 60.0000 mg | ORAL_TABLET | Freq: Every day | ORAL | 3 refills | Status: DC

## 2018-01-19 MED ORDER — ATORVASTATIN CALCIUM 40 MG PO TABS: 40.0000 mg | ORAL_TABLET | Freq: Every day | ORAL | Status: DC

## 2018-01-19 MED ORDER — AMLODIPINE BESYLATE 5 MG PO TABS: 5.0000 mg | ORAL_TABLET | Freq: Every day | ORAL | 3 refills | Status: DC

## 2018-01-19 MED ORDER — METOPROLOL SUCCINATE ER 100 MG PO TB24: 100.0000 mg | ORAL_TABLET | Freq: Every day | ORAL | 3 refills | Status: DC

## 2018-01-19 NOTE — Telephone Encounter (Signed)
**Note De-Identified  Obfuscation** The pt is being discharged from the hospital today. Will call tomorrow.

## 2018-01-19 NOTE — Discharge Summary (Signed)
Discharge Summary    Patient ID: Ryan [name],  MRN: [id number], DOB/AGE: January 12, 1930 84 y.o.  Admit date: 01/15/2018 Discharge date: 01/19/2018  Primary Care Provider: Dorothyann [name] Primary Cardiologist: Ryan Dawley, MD  Discharge Diagnoses    Principal Problem:   Unstable angina [facility]) Active Problems:   PAF (paroxysmal atrial fibrillation) (HCC)   Chronic combined systolic (congestive) and diastolic (congestive) heart failure (HCC)   CAD (coronary artery disease), native coronary artery   Chest pain with high risk for cardiac etiology   Hypertensive urgency   Allergies Allergies  Allergen Reactions  . Ciprofloxacin Anaphylaxis  . Hydrochlorothiazide Anaphylaxis  . Sulfa Antibiotics Anaphylaxis  . Ace Inhibitors Cough  . Losartan Cough  . Carvedilol Cough    Pt states it "causes him too cough."  . Lisinopril Cough    Pt reports worsening cough and "feeling wheezy."  . Soy Allergy Nausea And Vomiting    Diagnostic Studies/Procedures    Left heart catheterization 01/18/18:  Prox Cx lesion is 70% stenosed.  Ost 1st Mrg lesion is 75% stenosed.  Previously placed Mid RCA stent (unknown type) is widely patent.  Prox RCA lesion is 40% stenosed.  Post Atrio lesion is 75% stenosed.  Ost LAD to Prox LAD lesion is 30% stenosed.  IMPRESSION: Ryan [name] mid RCA stent is widely patent.  He does have a 40% calcified proximal RCA stenosis on the first bend which does not appear to be obstructive nor different from his prior cath.  The remainder of his anatomy is unchanged.  The etiology of his chest pain is not supported by any change in his coronary anatomy.  Medical therapy is recommended.  His sheath will be removed and pressure held. The patient left the lab in stable condition.  _____________   History of Present Illness     Ryan [name] was in his usual state of health until the evening of 01/13/18 when he experienced severe left sided chest pain at the  site of his pacemaker radiating across his chest with some associated SOB and tenderness to palpation of left upper chest wall. He reported that the pain lasted for hours and almost prompted him to present to the ED, however he did not. He was seen outpatient by Dr. Meda [name] 01/14/18 at which time he was without chest pain but primary complaint was generalized malaise which has persisted since his recent hospitalization for prosthetic aortic valve endocarditis (IV antibiotics completed 12/22/17). Given his CP complaints and recent infection, he was sent to the labs for CBC (no leukocytosis, H/H stable, PLT 106), CRP (wnl), Trop (0.29), Pro-BNP (3168), and blood cultures (not peformed). Given troponin elevation, he was recommended to present to the ED overnight for further evaluation.   At the time of admission, he was chest pain free. He reports intermittent left upper chest pain but is unable to correlate this with activity/rest. He reports DOE/SOB which has worsened over the past several weeks. He denies orthopnea, PND, LE edema, weight changes, dizziness, lightheadedness, or syncope. His last ischemic evaluation was a NST 12/2016 which was without reversible ischemia but deemed intermediate risk due to reduced EF of 48%.   ED course: Initially hypertensive on arrival but improved at this time, otherwise VSS. Labs notable for electrolytes wnl, Cr 2.49 (appears to be new baseline after IV antibiotics), no leukocytosis, Hgb 12.1, PLT 100, trop 0.10. Cardiology asked to evaluate for further recommendations.   Hospital Course     Consultants: None   1.  Unstable angina in patient with CAD s/p PCI/DES to RCA 2017: patient presented after an episode of left sided chest pain which was evaluated outpatient and found to have a troponin 0.29 and recommended to present to ED. Troponin trended down to 0.10 at the time of admission. Home apixaban was held and he was maintained on a heparin gtt throughout admission. He  was hydrated over the weekend and underwent [facility] 01/18/18 which revealed patent mid RCA stent with 40% calcified stenosis in the proximal RCA unchanged from prior cath with the remainder of his anatomy unchanged. He was recommended for medical management. His home atorvastatin was increased to 40mg  daily, home imdur was increased to 60mg  daily, and he was started on amlodipine 5mg  daily.  - Resume home apixaban at discharge - Continue statin, imdur, amlodipine, and metoprolol   2. Chronic combined systolic and diastolic heart failure:EF 50-55% on last TEE 11/2017, but had been as low as 35-40% earlier this year. CXR without edema this admission. He maintained euvolemic status. Metoprolol transitioned to succinate. - Continue metoprolol - Renal function precludes ACEI/ARB or spironolactone  3. Atrial fibrillation:home eliquis held in anticipation of LHC and he was maintained on a heparin gtt. Eliquis resumed the day after LHC. Rate well controlled throughout hospitalization. - Continue metoprolol for rate control - Continue apixaban for stroke prevention  4. Recent bioprosthetic aortic valve endocarditis:completed a 7 week course of IV antibiotics 12/22/17. No recent fevers and no leukocytosis on labs this admission. Last TEE without endocarditis but mentions small oscillating fibrinous structures on the pacer leads, not well visualized in additional views. BCx this admission were negative.  - Continue routine outpatient monitoring  5. Chronic kidney disease:Prior to antibiotics course, baseline Cr was around 1.1, however Cr has been >2.4 since 12/30/17. Possible this is his new baseline. He was hydrated throughout admission. Cr 2.5 on the day of discharge.  - Continue to monitor routinely outpatient  6. SSS s/p PPM: Last PPM check last month with normal function and 2% AT/AF burden. - Continue routine outpatient monitoring  7. HTN:BP was initially elevated and improved on the day of  discharge with addition of amlodipine and a higher dose of imdur.  - Continue amlodipine, metoprolol, and imdur  8. Claudications:undergoing PVD work-up with Dr. Gwenlyn Found outpatient. Imaging appointment has been rescheduled - Continue outpatient follow-up/work-up with Dr. Gwenlyn Found _____________  Discharge Vitals Blood pressure 113/60, pulse 62, temperature 98.3 F (36.8 C), temperature source Oral, resp. rate 16, height 5\' 6"  (1.676 m), weight 58 kg, SpO2 98 %.  Filed Weights   01/17/18 0512 01/18/18 0423 01/19/18 0400  Weight: 57.1 kg 57.8 kg 58 kg    Labs & Radiologic Studies    CBC Recent Labs    01/18/18 0546 01/19/18 0459  WBC 6.6 5.1  HGB 11.3* 9.9*  HCT 35.9* 31.7*  MCV 94.7 94.9  PLT 113* 700*   Basic Metabolic Panel Recent Labs    01/18/18 0546 01/19/18 0459  NA 143 141  K 4.0 4.0  CL 113* 114*  CO2 24 22  GLUCOSE 99 93  BUN 26* 24*  CREATININE 2.48* 2.54*  CALCIUM 9.0 8.5*   Liver Function Tests No results for input(s): AST, ALT, ALKPHOS, BILITOT, PROT, ALBUMIN in the last 72 hours. No results for input(s): LIPASE, AMYLASE in the last 72 hours. Cardiac Enzymes No results for input(s): CKTOTAL, CKMB, CKMBINDEX, TROPONINI in the last 72 hours. BNP Invalid input(s): POCBNP D-Dimer No results for input(s): DDIMER in the last  72 hours. Hemoglobin A1C No results for input(s): HGBA1C in the last 72 hours. Fasting Lipid Panel No results for input(s): CHOL, HDL, LDLCALC, TRIG, CHOLHDL, LDLDIRECT in the last 72 hours. Thyroid Function Tests No results for input(s): TSH, T4TOTAL, T3FREE, THYROIDAB in the last 72 hours.  Invalid input(s): FREET3 _____________  Dg Chest 2 View  Result Date: 01/15/2018 CLINICAL DATA:  Chest pain. Lab results indicate endocarditis. History of hypertension. Nonsmoker. EXAM: CHEST - 2 VIEW COMPARISON:  11/07/2017 FINDINGS: Cardiac pacemaker. Postoperative changes with valve prosthesis. Heart size and pulmonary vascularity are  normal. Emphysematous changes and scattered fibrosis in the lungs. No blunting of costophrenic angles. No pneumothorax. Mediastinal contours appear intact. IMPRESSION: Emphysematous changes and fibrosis in the lungs. No evidence of active pulmonary disease. Electronically Signed   By: Lucienne Capers M.D.   On: 01/15/2018 06:07   Disposition   Patient was seen and examined by Dr. Harrell Gave who deemed patient as stable for discharge. Follow-up has been arranged. Discharge medications as listed below.   Follow-up Plans & Appointments    Follow-up Information    Dorothy Spark, MD Follow up on 01/28/2018.   Specialty:  Cardiology Why:  Please arrive 15 minutes early for your 10:00am post-hospital follow-up appointment Contact information: [place] [phone]          Discharge Instructions    Diet - low sodium heart healthy   Complete by:  As directed    Increase activity slowly   Complete by:  As directed       Discharge Medications   Allergies as of 01/19/2018      Reactions   Ciprofloxacin Anaphylaxis   Hydrochlorothiazide Anaphylaxis   Sulfa Antibiotics Anaphylaxis   Ace Inhibitors Cough   Losartan Cough   Carvedilol Cough   Pt states it "causes him too cough."   Lisinopril Cough   Pt reports worsening cough and "feeling wheezy."   Soy Allergy Nausea And Vomiting      Medication List    STOP taking these medications   metoprolol tartrate 50 MG tablet Commonly known as:  LOPRESSOR     TAKE these medications   albuterol (2.5 MG/3ML) 0.083% nebulizer solution Commonly known as:  PROVENTIL Take 3 mLs (2.5 mg total) by nebulization every 8 (eight) hours as needed for wheezing or shortness of breath.   amLODipine 5 MG tablet Commonly known as:  NORVASC Take 1 tablet (5 mg total) by mouth daily. Start taking on:  01/20/2018   apixaban 2.5 MG Tabs tablet Commonly known as:  ELIQUIS Take 1 tablet (2.5 mg total)  by mouth 2 (two) times daily.   atorvastatin 40 MG tablet Commonly known as:  LIPITOR Take 1 tablet (40 mg total) by mouth daily at 6 PM. What changed:    medication strength  how much to take  when to take this   budesonide-formoterol 80-4.5 MCG/ACT inhaler Commonly known as:  SYMBICORT Inhale 2 puffs into the lungs 2 (two) times daily.   cholecalciferol 1000 units tablet Commonly known as:  VITAMIN D Take 1,000 Units by mouth 2 (two) times a week.   isosorbide mononitrate 60 MG 24 hr tablet Commonly known as:  IMDUR Take 1 tablet (60 mg total) by mouth daily. Start taking on:  01/20/2018 What changed:    medication strength  how much to take  additional instructions   latanoprost 0.005 % ophthalmic solution Commonly known as:  XALATAN Place 1 drop into  both eyes at bedtime.   levothyroxine 25 MCG tablet Commonly known as:  SYNTHROID, LEVOTHROID Take 0.5 tablets (12.5 mcg total) by mouth daily before breakfast.   lidocaine 5 % Commonly known as:  LIDODERM Place 1 patch onto the skin daily. Remove & Discard patch within 12 hours or as directed by MD   menthol-cetylpyridinium 3 MG lozenge Commonly known as:  CEPACOL Take 1 lozenge (3 mg total) by mouth as needed for sore throat.   metoprolol succinate 100 MG 24 hr tablet Commonly known as:  TOPROL-XL Take 1 tablet (100 mg total) by mouth daily. Take with or immediately following a meal. Start taking on:  01/20/2018   nitroGLYCERIN 0.4 MG SL tablet Commonly known as:  NITROSTAT Place 1 tablet (0.4 mg total) under the tongue every 5 (five) minutes x 3 doses as needed for chest pain. What changed:    when to take this  reasons to take this   nystatin cream Commonly known as:  MYCOSTATIN Apply 1 application topically 2 (two) times daily.   OXYGEN Inhale 2 L into the lungs daily as needed (shortness of breath or chest pain).   polyethylene glycol packet Commonly known as:  MIRALAX / GLYCOLAX Take 17 g  by mouth daily. What changed:    when to take this  reasons to take this   RHOPRESSA 0.02 % Soln Generic drug:  Netarsudil Dimesylate Place 1 drop into both eyes at bedtime. Sample given by the physician   senna 8.6 MG Tabs tablet Commonly known as:  SENOKOT Take 1 tablet (8.6 mg total) by mouth daily as needed for mild constipation.   vitamin B-12 1000 MCG tablet Commonly known as:  CYANOCOBALAMIN Take 1,000 mcg by mouth daily.         Outstanding Labs/Studies   None  Duration of Discharge Encounter   Greater than 30 minutes including physician time.  Signed, Abigail Butts PA-C 01/19/2018, 12:50 PM

## 2018-01-19 NOTE — Care Management Important Message (Signed)
Important Message  Patient Details  Name: Ryan [name] MRN: [id number] Date of Birth: 08-11-1929   Medicare Important Message Given:  Yes    Orbie Pyo 01/19/2018, 4:28 PM

## 2018-01-19 NOTE — Telephone Encounter (Signed)
New Message:  TOC Per Sidney Ace  01/28/18 at 10am

## 2018-01-19 NOTE — Plan of Care (Signed)
Pt given discharge instructions with understanding. Carroll Kinds RN

## 2018-01-19 NOTE — Discharge Instructions (Signed)

## 2018-01-20 ENCOUNTER — Encounter (HOSPITAL_COMMUNITY): Payer: Medicare Other

## 2018-01-20 LAB — CULTURE, BLOOD (ROUTINE X 2)
CULTURE: NO GROWTH
Culture: NO GROWTH
SPECIAL REQUESTS: ADEQUATE
SPECIAL REQUESTS: ADEQUATE

## 2018-01-20 LAB — CULTURE, BLOOD (SINGLE)

## 2018-01-20 NOTE — Telephone Encounter (Signed)
Patient contacted regarding discharge from  [facility] on 01/19/18  Patient understands to follow up with provider Dr. Meda Coffee on 01/28/18 at 10:00 at [facility]. Patient understands discharge instructions? yes Patient understands medications and regiment? yes Patient understands to bring all medications to this visit? Yes

## 2018-01-25 ENCOUNTER — Encounter: Payer: Medicare Other | Admitting: Internal Medicine

## 2018-01-26 ENCOUNTER — Other Ambulatory Visit: Payer: Medicare Other

## 2018-01-26 ENCOUNTER — Ambulatory Visit: Payer: Medicare Other | Admitting: Cardiovascular Disease

## 2018-01-26 DIAGNOSIS — R55 Syncope and collapse: Secondary | ICD-10-CM | POA: Diagnosis not present

## 2018-01-26 DIAGNOSIS — I5032 Chronic diastolic (congestive) heart failure: Secondary | ICD-10-CM | POA: Diagnosis not present

## 2018-01-26 DIAGNOSIS — J984 Other disorders of lung: Secondary | ICD-10-CM | POA: Diagnosis not present

## 2018-01-28 ENCOUNTER — Encounter: Payer: Self-pay | Admitting: Cardiology

## 2018-01-28 ENCOUNTER — Ambulatory Visit: Payer: Medicare Other | Admitting: Cardiology

## 2018-01-28 VITALS — BP 150/74 | HR 67 | Ht 66.0 in | Wt 129.4 lb

## 2018-01-28 DIAGNOSIS — I739 Peripheral vascular disease, unspecified: Secondary | ICD-10-CM

## 2018-01-28 DIAGNOSIS — I25118 Atherosclerotic heart disease of native coronary artery with other forms of angina pectoris: Secondary | ICD-10-CM

## 2018-01-28 DIAGNOSIS — I38 Endocarditis, valve unspecified: Secondary | ICD-10-CM

## 2018-01-28 DIAGNOSIS — I495 Sick sinus syndrome: Secondary | ICD-10-CM | POA: Diagnosis not present

## 2018-01-28 DIAGNOSIS — Z952 Presence of prosthetic heart valve: Secondary | ICD-10-CM | POA: Diagnosis not present

## 2018-01-28 DIAGNOSIS — T826XXD Infection and inflammatory reaction due to cardiac valve prosthesis, subsequent encounter: Secondary | ICD-10-CM

## 2018-01-28 MED ORDER — LANSOPRAZOLE 15 MG PO CPDR: 15.0000 mg | DELAYED_RELEASE_CAPSULE | Freq: Every day | ORAL | 6 refills | Status: DC

## 2018-01-28 NOTE — Progress Notes (Signed)
Cardiology Office Note    Date:  01/28/2018  ID:  Ryan [name], DOB [birth year], MRN [id number] PCP:  Dorothyann Peng, NP  Cardiologist:  Ena Dawley, MD   Chief Complaint: Follow up for prosthetic aortic valve endocarditis  History of Present Illness:  Ryan [name] a very pleasant3 year old Caucasian male with past medical history significant for carotid artery disease status post bilateral CEA, CAD status post PTCA/DES to RCA 04/2016, aortic stenosis status post TAVR 06/2016, HTN, HLD, prior SVT, OSA, chronic combined CHF, cardiomyopathy status post PPM, paroxysmal atrial fibrillation on Eliquis who presented to ED on 11/07/2017 with progressive left lower back pain of 3 days duration not relieved by pain medications.Patient was 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 He was doing better until 3 days prior to admission when he developed worsening left low back pain and profound fatigue.    He was admitted on November 07, 2017 and diagnosed with eterococcal bacteremia withprosthetic valve endocarditis.  There was a large vegetation seen on bioprosthetic aortic valve, no vegetation was seen on pacemaker leads. Patient has been on IV antibiotics IV ampicillin and Rocephin for the endocarditis.  Patient was discharged to skilled nursing facility to complete course of antibiotics, completed on December 22, 2017 During the hospital stay, the patient developed an acute kidney injury believed to be secondary to dehydration and possible endocarditis embolization. Patient's intractable back pain was further evaluated, CT showed spondylosis, mild to moderate multilevel foraminal stenosis, pars defect L 4-5.   Neurosurgery reassessed on 6/11 and indicatedthat his acute onset of back pain is due to 1 of 3 possibilities: Exacerbation of chronic lumbar spondylosis, spontaneous spinal EDH related to Eliquis or discitis. He  recommends nonoperative treatment, continue antibiotics for his bacteremia and if stroke risk felt to be high without Eliquis then consider resuming it.   01/27/2018 -the patient was readmitted on January 15, 2018 for complaint of like of energy and chest pain with elevated troponin.  Coronary CTA on August 16 showed widely patent mid RCA stent the remainder of anatomy and change, the etiology of his chest pain is not supported by any change in his coronary anatomy and medical therapy was recommended.  He was treated for acute combined systolic and diastolic heart failure.  Discharged on August 20 has 2019. Today he states that he has more energy, he finally feels little better, alternates exercises with naps during the day, denies any chest pain other than heartburn at night, no lower extremity edema, no orthopnea, paroxmal nocturnal dyspnea no fevers.   Past Medical History:  Diagnosis Date  . Age-related macular degeneration   . Allergic rhinitis   . Amiodarone toxicity   . Aortic valve stenosis    a. s/p TAVR 06/2016.  . Arthritis    "hands, lower back" (04/30/2016)  . Asthma   . Cardiomyopathy ([place])    a. EF declined to 35-40% by echo 09/2017 (previously 50-55% after TAVR).  . Carotid artery obstruction    a. s/p bilat CEA.  . Chronic combined systolic and diastolic CHF (congestive heart failure) ([place])   . Chronic lower back pain   . Coronary arteriosclerosis in native artery    a. 2017 Cardiac catheterization demonstrated worsening CAD with 70% mid LCx, 80% OM1 and 80% mid RCA stenosis. b.  In 11/17, he underwent successful rotational atherectomy and DES to RCA.  . Diverticulitis of colon   . DJD (degenerative joint disease)   .  Dyspnea    "constant but the degree changes"  . GERD (gastroesophageal reflux disease)   . History of elevated PSA 05/2009  . History of hiatal hernia   . HLD (hyperlipidemia)   . Hypertension   . Leukocytosis 10/2017  . Nocturnal hypoxemia   . OSA  (obstructive sleep apnea)    intolerant to CPAP  . PAF (paroxysmal atrial fibrillation) ([place])   . Paroxysmal supraventricular tachycardia ([place])   . Pneumonia    "when I was a kid"  . Presence of permanent cardiac pacemaker   . Primary malignant neoplasm of bladder ([place])   . Prostate cancer [facility])     Past Surgical History:  Procedure Laterality Date  . APPENDECTOMY    . CARDIAC CATHETERIZATION N/A 01/17/2016   Procedure: Right/Left Heart Cath and Coronary Angiography;  Surgeon: Belva Crome, MD;  Location: [place] [facility];  Service: Cardiovascular;  Laterality: N/A;  . CARDIAC CATHETERIZATION N/A 04/30/2016   Procedure: Coronary/Graft Atherectomy;  Surgeon: Sherren Mocha, MD;  Location: [place] [facility];  Service: Cardiovascular;  Laterality: N/A;  . CAROTID ENDARTERECTOMY Bilateral   . CATARACT EXTRACTION W/ INTRAOCULAR LENS  IMPLANT, BILATERAL Bilateral   . COLON SURGERY  1981   Meckles Diverticulum with volvulus  . CORONARY ANGIOGRAPHY N/A 01/18/2018   Procedure: CORONARY ANGIOGRAPHY;  Surgeon: Lorretta Harp, MD;  Location: [place] [facility];  Service: Cardiovascular;  Laterality: N/A;  . EP IMPLANTABLE DEVICE N/A 03/21/2016   Procedure: Pacemaker Implant;  Surgeon: Evans Lance, MD;  Location: [place] [facility];  Service: Cardiovascular;  Laterality: N/A;  . EYE SURGERY    . INGUINAL HERNIA REPAIR Right 2009  . INSERT / REPLACE / REMOVE PACEMAKER    . INSERTION PROSTATE RADIATION SEED    . TEE WITHOUT CARDIOVERSION N/A 06/10/2016   Procedure: TRANSESOPHAGEAL ECHOCARDIOGRAM (TEE);  Surgeon: Sherren Mocha, MD;  Location: [place];  Service: Open Heart Surgery;  Laterality: N/A;  . TEE WITHOUT CARDIOVERSION N/A 11/10/2017   Procedure: TRANSESOPHAGEAL ECHOCARDIOGRAM (TEE);  Surgeon: Acie Fredrickson Wonda Cheng, MD;  Location[facility] ENDOSCOPY;  Service: Cardiovascular;  Laterality: N/A;  . TEE WITHOUT CARDIOVERSION N/A 12/15/2017   Procedure: TRANSESOPHAGEAL ECHOCARDIOGRAM (TEE);  Surgeon:  Buford Dresser, MD;  Location: [facility] ENDOSCOPY;  Service: Cardiovascular;  Laterality: N/A;  . TONSILLECTOMY  ~ 1947  . TRANSCATHETER AORTIC VALVE REPLACEMENT, TRANSFEMORAL N/A 06/10/2016   Procedure: TRANSCATHETER AORTIC VALVE REPLACEMENT, TRANSFEMORAL;  Surgeon: Sherren Mocha, MD;  Location: [place];  Service: Open Heart Surgery;  Laterality: N/A;    Current Medications: Current Meds  Medication Sig  . albuterol (PROVENTIL) (2.5 MG/3ML) 0.083% nebulizer solution Take 3 mLs (2.5 mg total) by nebulization every 8 (eight) hours as needed for wheezing or shortness of breath.  Marland Kitchen amLODipine (NORVASC) 5 MG tablet Take 1 tablet (5 mg total) by mouth daily.  Marland Kitchen apixaban (ELIQUIS) 2.5 MG TABS tablet Take 1 tablet (2.5 mg total) by mouth 2 (two) times daily.  Marland Kitchen atorvastatin (LIPITOR) 40 MG tablet Take 1 tablet (40 mg total) by mouth daily at 6 PM.  . budesonide-formoterol (SYMBICORT) 80-4.5 MCG/ACT inhaler Inhale 2 puffs into the lungs 2 (two) times daily.  . cholecalciferol (VITAMIN D) 1000 units tablet Take 1,000 Units by mouth 2 (two) times a week.   . isosorbide mononitrate (IMDUR) 60 MG 24 hr tablet Take 1 tablet (60 mg total) by mouth daily.  Marland Kitchen latanoprost (XALATAN) 0.005 % ophthalmic solution Place 1 drop into both eyes at bedtime.  Marland Kitchen  levothyroxine (SYNTHROID, LEVOTHROID) 25 MCG tablet Take 0.5 tablets (12.5 mcg total) by mouth daily before breakfast.  . lidocaine (LIDODERM) 5 % Place 1 patch onto the skin daily. Remove & Discard patch within 12 hours or as directed by MD  . menthol-cetylpyridinium (CEPACOL) 3 MG lozenge Take 1 lozenge (3 mg total) by mouth as needed for sore throat.  . metoprolol succinate (TOPROL-XL) 100 MG 24 hr tablet Take 1 tablet (100 mg total) by mouth daily. Take with or immediately following a meal.  . Netarsudil Dimesylate (RHOPRESSA) 0.02 % SOLN Place 1 drop into both eyes at bedtime. Sample given by the physician  . nitroGLYCERIN (NITROSTAT) 0.4 MG SL tablet Place 1  tablet (0.4 mg total) under the tongue every 5 (five) minutes x 3 doses as needed for chest pain.  Marland Kitchen nystatin cream (MYCOSTATIN) Apply 1 application topically 2 (two) times daily.  . OXYGEN Inhale 2 L into the lungs daily as needed (shortness of breath or chest pain).  . polyethylene glycol (MIRALAX / GLYCOLAX) packet Take 17 g by mouth daily. (Patient taking differently: Take 17 g by mouth daily as needed for moderate constipation. )  . senna (SENOKOT) 8.6 MG TABS tablet Take 1 tablet (8.6 mg total) by mouth daily as needed for mild constipation.  . vitamin B-12 (CYANOCOBALAMIN) 1000 MCG tablet Take 1,000 mcg by mouth daily.    Allergies:   Ciprofloxacin; Hydrochlorothiazide; Sulfa antibiotics; Ace inhibitors; Losartan; Carvedilol; Lisinopril; and Soy allergy   Social History   Socioeconomic History  . Marital status: Married    Spouse name: Not on file  . Number of children: Not on file  . Years of education: Not on file  . Highest education level: Not on file  Occupational History  . Occupation: retired  Scientific laboratory technician  . Financial resource strain: Not on file  . Food insecurity:    Worry: Not on file    Inability: Not on file  . Transportation needs:    Medical: Not on file    Non-medical: Not on file  Tobacco Use  . Smoking status: Never Smoker  . Smokeless tobacco: Never Used  Substance and Sexual Activity  . Alcohol use: No    Alcohol/week: 0.0 standard drinks    Comment: 04/30/2016 "nothing in years"  . Drug use: No  . Sexual activity: Never  Lifestyle  . Physical activity:    Days per week: Not on file    Minutes per session: Not on file  . Stress: Not on file  Relationships  . Social connections:    Talks on phone: Not on file    Gets together: Not on file    Attends religious service: Not on file    Active member of club or organization: Not on file    Attends meetings of clubs or organizations: Not on file    Relationship status: Not on file  Other Topics  Concern  . Not on file  Social History Narrative   Retired    Three children - One in [place], One in [place], and one in [place].         Family History:  The patient's family history includes Heart disease in his father and mother.  ROS:   Please see the history of present illness.  All other systems are reviewed and otherwise negative.    PHYSICAL EXAM:   VS:  BP (!) 150/74   Pulse 67   Ht 5\' 6"  (1.676 m)   Wt 129 lb  6.4 oz (58.7 kg)   SpO2 98%   BMI 20.89 kg/m   BMI: Body mass index is 20.89 kg/m. GEN: Well nourished, well developed chronically ill appearing WM, in no acute distress HEENT: normocephalic, atraumatic Neck: no JVD, carotid bruits, or masses Cardiac: RRR; no murmurs, rubs, or gallops, no edema  Respiratory:  Diminished throughout without wheezes/rales/rhonchi, normal work of breathing GI: soft, nontender, nondistended, + BS MS: no deformity or atrophy Skin: warm and dry, no rash Neuro:  Alert and Oriented x 3, Strength and sensation are intact, follows commands. reporst generalized R eye blurriness that comes and goes Psych: euthymic mood, full affect  Wt Readings from Last 3 Encounters:  01/28/18 129 lb 6.4 oz (58.7 kg)  01/19/18 127 lb 12.8 oz (58 kg)  01/14/18 125 lb 9.6 oz (57 kg)    Studies/Labs Reviewed:   EKG:  EKG was ordered today and personally reviewed by me and demonstrates atrial paced rhythm with prolonged AV conduction, left bundle branch block is no longer present.  Recent Labs: 11/07/2017: B Natriuretic Peptide 219.3 12/02/2017: ALT 22 01/14/2018: NT-Pro BNP 3,168; TSH 3.390 01/19/2018: BUN 24; Creatinine, Ser 2.54; Hemoglobin 9.9; Platelets 100; Potassium 4.0; Sodium 141   Lipid Panel    Component Value Date/Time   CHOL 130 05/01/2017 0827   TRIG 70.0 05/01/2017 0827   HDL 56.00 05/01/2017 0827   CHOLHDL 2 05/01/2017 0827   VLDL 14.0 05/01/2017 0827   LDLCALC 60 05/01/2017 0827    Additional studies/ records that were reviewed  today include: Summarized above  ASSESSMENT & PLAN:   1. Bioprosthetic aortic valve endocarditis -diagnosed on November 13, 2017, on antibiotics IV ampicillin and Rocephin for total of 6 weeks until December 22, 2017.  Follow-up TEE on December 15, 2017 showed no evidence for endocarditis.  Repeat 2 sets of blood cultures were obtained on December 28, 2017 and both were negative after 5 days of growth.  His pacemaker leads were not affected.  2. CAD -status post cath on December 15, 2017 with stable findings as above, medical therapy is indicated.  3.  Paroxysmal atrial fibrillation, he is maintaining sinus rhythm, recently diagnosed with amiodarone toxicity, now only on metoprolol, Eliquis for anticoagulation, no bleeding.  4. Chronic combined CHF with cardiomyopathy - LV function has declined over the last several years.  Euvolemic.  5. Sick sinus syndrome, status post pacemaker placement, working normally, followed by Dr. Lovena Ryan.  6.  Claudications with abnormal ABIs, Dr. Alvester Chou recommends medical therapy unless severe claudications.  Disposition: Follow-up with me on April 20, 2018.  Medication Adjustments/Labs and Tests Ordered: Current medicines are reviewed at length with the patient today.  Concerns regarding medicines are outlined above. Medication changes, Labs and Tests ordered today are summarized above and listed in the Patient Instructions accessible in Encounters.   Signed, Ena Dawley, MD  01/28/2018 10:28 AM    [address], [place], [place]  [address] Phone: [phone]; Fax: [phone]

## 2018-01-28 NOTE — Patient Instructions (Addendum)
Medication Instructions:   START TAKING PREVACID 15 MG BY MOUTH DAILY AT NOON--FOLLOW INSTRUCTIONS PROVIDED ON THE BOTTLE     Follow-Up:  ADD TO DR. Thera Flake CLINIC ON 04/20/18 AT HER 10:40 AM END SLOT, PER DR NELSON       If you need a refill on your cardiac medications before your next appointment, please call your pharmacy.

## 2018-02-02 NOTE — Addendum Note (Signed)
Addended by: Doneta Public C on: 02/02/2018 10:21 AM   Modules accepted: Orders

## 2018-02-04 LAB — CUP PACEART REMOTE DEVICE CHECK
Battery Impedance: 162 Ohm
Brady Statistic AP VS Percent: 98 %
Brady Statistic AS VP Percent: 0 %
Brady Statistic AS VS Percent: 2 %
Date Time Interrogation Session: 20190812194502
Implantable Lead Implant Date: 20171020
Implantable Lead Implant Date: 20171020
Implantable Lead Location: 753859
Implantable Lead Location: 753860
Implantable Lead Model: 5076
Lead Channel Impedance Value: 486 Ohm
Lead Channel Pacing Threshold Amplitude: 0.5 V
Lead Channel Pacing Threshold Pulse Width: 0.4 ms
Lead Channel Setting Pacing Amplitude: 2.5 V
Lead Channel Setting Pacing Pulse Width: 0.4 ms
MDC IDC MSMT BATTERY REMAINING LONGEVITY: 125 mo
MDC IDC MSMT BATTERY VOLTAGE: 2.79 V
MDC IDC MSMT LEADCHNL RA IMPEDANCE VALUE: 406 Ohm
MDC IDC MSMT LEADCHNL RV PACING THRESHOLD AMPLITUDE: 0.875 V
MDC IDC MSMT LEADCHNL RV PACING THRESHOLD PULSEWIDTH: 0.4 ms
MDC IDC PG IMPLANT DT: 20171020
MDC IDC SET LEADCHNL RA PACING AMPLITUDE: 2 V
MDC IDC SET LEADCHNL RV SENSING SENSITIVITY: 4 mV
MDC IDC STAT BRADY AP VP PERCENT: 0 %

## 2018-02-08 DIAGNOSIS — H401213 Low-tension glaucoma, right eye, severe stage: Secondary | ICD-10-CM | POA: Diagnosis not present

## 2018-02-08 DIAGNOSIS — H18003 Unspecified corneal deposit, bilateral: Secondary | ICD-10-CM | POA: Diagnosis not present

## 2018-02-08 DIAGNOSIS — H26493 Other secondary cataract, bilateral: Secondary | ICD-10-CM | POA: Diagnosis not present

## 2018-02-08 DIAGNOSIS — H401222 Low-tension glaucoma, left eye, moderate stage: Secondary | ICD-10-CM | POA: Diagnosis not present

## 2018-02-18 ENCOUNTER — Telehealth: Payer: Self-pay | Admitting: Adult Health

## 2018-02-18 NOTE — Telephone Encounter (Signed)
Copied from [place] [id number]. Topic: General - Other >> Feb 11, 2018  9:24 AM Alfredia Ferguson R wrote: Pt states he will be coming in to drop a memo off for Dr Carlisle Cater >> Feb 18, 2018  3:02 PM Ivar Drape wrote: Patient would like to speak to [facility] assist, Misty about the letter he dropped off for [place] last week. He would like a return call at [phone] or [phone].

## 2018-02-18 NOTE — Telephone Encounter (Signed)
Called and d/c o2

## 2018-02-18 NOTE — Telephone Encounter (Signed)
Patient has been notified

## 2018-02-18 NOTE — Telephone Encounter (Signed)
Called and spoke to patients wife. She stated that oxygen continues to be delivered to their house and they are being charged every month. She said that the company will continue to deliver it until they receive the letter from [facility] stating that it is not needed.

## 2018-02-22 NOTE — Telephone Encounter (Signed)
Pt's wife called to check to see if pcp has sent a fax to [employer] yet for no longer needing the oxygen; contact to advise

## 2018-02-26 DIAGNOSIS — I5032 Chronic diastolic (congestive) heart failure: Secondary | ICD-10-CM | POA: Diagnosis not present

## 2018-02-26 DIAGNOSIS — J984 Other disorders of lung: Secondary | ICD-10-CM | POA: Diagnosis not present

## 2018-02-26 DIAGNOSIS — R55 Syncope and collapse: Secondary | ICD-10-CM | POA: Diagnosis not present

## 2018-03-06 ENCOUNTER — Other Ambulatory Visit: Payer: Self-pay | Admitting: Cardiology

## 2018-03-08 ENCOUNTER — Ambulatory Visit (INDEPENDENT_AMBULATORY_CARE_PROVIDER_SITE_OTHER): Payer: Medicare Other

## 2018-03-08 DIAGNOSIS — Z23 Encounter for immunization: Secondary | ICD-10-CM

## 2018-03-12 ENCOUNTER — Other Ambulatory Visit: Payer: Self-pay | Admitting: Cardiology

## 2018-03-12 NOTE — Telephone Encounter (Signed)
Order Providers   Prescribing Provider Encounter Provider  Kroeger, Lorelee Cover., PA-C None  Outpatient Medication Detail    Disp Refills Start End   metoprolol succinate (TOPROL-XL) 100 MG 24 hr tablet 90 tablet 3 01/20/2018    Sig - Route: Take 1 tablet (100 mg total) by mouth daily. Take with or immediately following a meal. - Oral   Sent to pharmacy as: metoprolol succinate (TOPROL-XL) 100 MG 24 hr tablet   E-Prescribing Status: Receipt confirmed by pharmacy (01/19/2018 12:43 PM EDT)   [place], [address] AT [place]

## 2018-03-28 ENCOUNTER — Emergency Department (HOSPITAL_COMMUNITY): Payer: Medicare Other

## 2018-03-28 ENCOUNTER — Emergency Department (HOSPITAL_COMMUNITY)
Admission: EM | Admit: 2018-03-28 | Discharge: 2018-03-28 | Disposition: A | Discharge status: Home / Self Care | Payer: Medicare Other | Source: Home / Self Care | Attending: Emergency Medicine | Admitting: Emergency Medicine

## 2018-03-28 ENCOUNTER — Encounter (HOSPITAL_COMMUNITY): Payer: Self-pay | Admitting: *Deleted

## 2018-03-28 ENCOUNTER — Other Ambulatory Visit: Payer: Self-pay

## 2018-03-28 DIAGNOSIS — R6883 Chills (without fever): Secondary | ICD-10-CM | POA: Diagnosis not present

## 2018-03-28 DIAGNOSIS — R42 Dizziness and giddiness: Secondary | ICD-10-CM | POA: Diagnosis not present

## 2018-03-28 DIAGNOSIS — Z950 Presence of cardiac pacemaker: Secondary | ICD-10-CM | POA: Diagnosis not present

## 2018-03-28 DIAGNOSIS — I4891 Unspecified atrial fibrillation: Secondary | ICD-10-CM

## 2018-03-28 DIAGNOSIS — I110 Hypertensive heart disease with heart failure: Secondary | ICD-10-CM | POA: Insufficient documentation

## 2018-03-28 DIAGNOSIS — I2510 Atherosclerotic heart disease of native coronary artery without angina pectoris: Secondary | ICD-10-CM | POA: Diagnosis not present

## 2018-03-28 DIAGNOSIS — D696 Thrombocytopenia, unspecified: Secondary | ICD-10-CM | POA: Diagnosis not present

## 2018-03-28 DIAGNOSIS — R509 Fever, unspecified: Secondary | ICD-10-CM | POA: Insufficient documentation

## 2018-03-28 DIAGNOSIS — Z955 Presence of coronary angioplasty implant and graft: Secondary | ICD-10-CM | POA: Diagnosis not present

## 2018-03-28 DIAGNOSIS — D72829 Elevated white blood cell count, unspecified: Secondary | ICD-10-CM | POA: Diagnosis not present

## 2018-03-28 DIAGNOSIS — Z8679 Personal history of other diseases of the circulatory system: Secondary | ICD-10-CM | POA: Diagnosis not present

## 2018-03-28 DIAGNOSIS — Z881 Allergy status to other antibiotic agents status: Secondary | ICD-10-CM | POA: Diagnosis not present

## 2018-03-28 DIAGNOSIS — I480 Paroxysmal atrial fibrillation: Secondary | ICD-10-CM | POA: Diagnosis not present

## 2018-03-28 DIAGNOSIS — R5381 Other malaise: Secondary | ICD-10-CM | POA: Diagnosis not present

## 2018-03-28 DIAGNOSIS — I739 Peripheral vascular disease, unspecified: Secondary | ICD-10-CM | POA: Diagnosis not present

## 2018-03-28 DIAGNOSIS — I350 Nonrheumatic aortic (valve) stenosis: Secondary | ICD-10-CM | POA: Diagnosis not present

## 2018-03-28 DIAGNOSIS — R531 Weakness: Secondary | ICD-10-CM | POA: Insufficient documentation

## 2018-03-28 DIAGNOSIS — Y831 Surgical operation with implant of artificial internal device as the cause of abnormal reaction of the patient, or of later complication, without mention of misadventure at the time of the procedure: Secondary | ICD-10-CM | POA: Diagnosis present

## 2018-03-28 DIAGNOSIS — M1990 Unspecified osteoarthritis, unspecified site: Secondary | ICD-10-CM | POA: Diagnosis present

## 2018-03-28 DIAGNOSIS — I5042 Chronic combined systolic (congestive) and diastolic (congestive) heart failure: Secondary | ICD-10-CM | POA: Diagnosis not present

## 2018-03-28 DIAGNOSIS — I509 Heart failure, unspecified: Secondary | ICD-10-CM | POA: Insufficient documentation

## 2018-03-28 DIAGNOSIS — Z79899 Other long term (current) drug therapy: Secondary | ICD-10-CM

## 2018-03-28 DIAGNOSIS — R5383 Other fatigue: Secondary | ICD-10-CM | POA: Diagnosis not present

## 2018-03-28 DIAGNOSIS — Z8551 Personal history of malignant neoplasm of bladder: Secondary | ICD-10-CM | POA: Insufficient documentation

## 2018-03-28 DIAGNOSIS — M549 Dorsalgia, unspecified: Secondary | ICD-10-CM | POA: Diagnosis not present

## 2018-03-28 DIAGNOSIS — Z952 Presence of prosthetic heart valve: Secondary | ICD-10-CM | POA: Diagnosis not present

## 2018-03-28 DIAGNOSIS — R0602 Shortness of breath: Secondary | ICD-10-CM | POA: Insufficient documentation

## 2018-03-28 DIAGNOSIS — B952 Enterococcus as the cause of diseases classified elsewhere: Secondary | ICD-10-CM | POA: Diagnosis not present

## 2018-03-28 DIAGNOSIS — E039 Hypothyroidism, unspecified: Secondary | ICD-10-CM | POA: Diagnosis not present

## 2018-03-28 DIAGNOSIS — J45909 Unspecified asthma, uncomplicated: Secondary | ICD-10-CM

## 2018-03-28 DIAGNOSIS — Z888 Allergy status to other drugs, medicaments and biological substances status: Secondary | ICD-10-CM | POA: Diagnosis not present

## 2018-03-28 DIAGNOSIS — N179 Acute kidney failure, unspecified: Secondary | ICD-10-CM | POA: Diagnosis not present

## 2018-03-28 DIAGNOSIS — I495 Sick sinus syndrome: Secondary | ICD-10-CM | POA: Diagnosis not present

## 2018-03-28 DIAGNOSIS — K219 Gastro-esophageal reflux disease without esophagitis: Secondary | ICD-10-CM | POA: Diagnosis not present

## 2018-03-28 DIAGNOSIS — N184 Chronic kidney disease, stage 4 (severe): Secondary | ICD-10-CM | POA: Diagnosis not present

## 2018-03-28 DIAGNOSIS — Z8546 Personal history of malignant neoplasm of prostate: Secondary | ICD-10-CM | POA: Diagnosis not present

## 2018-03-28 DIAGNOSIS — I429 Cardiomyopathy, unspecified: Secondary | ICD-10-CM | POA: Diagnosis not present

## 2018-03-28 DIAGNOSIS — G4733 Obstructive sleep apnea (adult) (pediatric): Secondary | ICD-10-CM | POA: Diagnosis not present

## 2018-03-28 DIAGNOSIS — H3530 Unspecified macular degeneration: Secondary | ICD-10-CM | POA: Diagnosis not present

## 2018-03-28 DIAGNOSIS — E785 Hyperlipidemia, unspecified: Secondary | ICD-10-CM

## 2018-03-28 DIAGNOSIS — R7881 Bacteremia: Secondary | ICD-10-CM | POA: Diagnosis not present

## 2018-03-28 DIAGNOSIS — G8929 Other chronic pain: Secondary | ICD-10-CM | POA: Diagnosis not present

## 2018-03-28 DIAGNOSIS — I130 Hypertensive heart and chronic kidney disease with heart failure and stage 1 through stage 4 chronic kidney disease, or unspecified chronic kidney disease: Secondary | ICD-10-CM | POA: Diagnosis not present

## 2018-03-28 DIAGNOSIS — Z882 Allergy status to sulfonamides status: Secondary | ICD-10-CM | POA: Diagnosis not present

## 2018-03-28 DIAGNOSIS — I351 Nonrheumatic aortic (valve) insufficiency: Secondary | ICD-10-CM | POA: Diagnosis not present

## 2018-03-28 DIAGNOSIS — T826XXA Infection and inflammatory reaction due to cardiac valve prosthesis, initial encounter: Secondary | ICD-10-CM | POA: Diagnosis not present

## 2018-03-28 DIAGNOSIS — Z91018 Allergy to other foods: Secondary | ICD-10-CM | POA: Diagnosis not present

## 2018-03-28 DIAGNOSIS — Z95810 Presence of automatic (implantable) cardiac defibrillator: Secondary | ICD-10-CM | POA: Diagnosis not present

## 2018-03-28 LAB — BASIC METABOLIC PANEL
Anion gap: 11 (ref 5–15)
BUN: 34 mg/dL — ABNORMAL HIGH (ref 8–23)
CO2: 22 mmol/L (ref 22–32)
Calcium: 8.9 mg/dL (ref 8.9–10.3)
Chloride: 107 mmol/L (ref 98–111)
Creatinine, Ser: 2.03 mg/dL — ABNORMAL HIGH (ref 0.61–1.24)
GFR calc Af Amer: 33 mL/min — ABNORMAL LOW (ref >60)
GFR calc non Af Amer: 28 mL/min — ABNORMAL LOW (ref >60)
Glucose, Bld: 119 mg/dL — ABNORMAL HIGH (ref 70–99)
Potassium: 4.4 mmol/L (ref 3.5–5.1)
Sodium: 140 mmol/L (ref 135–145)

## 2018-03-28 LAB — CBG MONITORING, ED: Glucose-Capillary: 121 mg/dL — ABNORMAL HIGH (ref 70–99)

## 2018-03-28 LAB — URINALYSIS, ROUTINE W REFLEX MICROSCOPIC
Bilirubin Urine: NEGATIVE
Glucose, UA: NEGATIVE mg/dL
Hgb urine dipstick: NEGATIVE
Ketones, ur: NEGATIVE mg/dL
Leukocytes, UA: NEGATIVE
Nitrite: NEGATIVE
Protein, ur: NEGATIVE mg/dL
Specific Gravity, Urine: 1.013 (ref 1.005–1.030)
pH: 5 (ref 5.0–8.0)

## 2018-03-28 LAB — CBC
HCT: 33.3 % — ABNORMAL LOW (ref 39.0–52.0)
Hemoglobin: 10.6 g/dL — ABNORMAL LOW (ref 13.0–17.0)
MCH: 29.5 pg (ref 26.0–34.0)
MCHC: 31.8 g/dL (ref 30.0–36.0)
MCV: 92.8 fL (ref 80.0–100.0)
Platelets: 120 10*3/uL — ABNORMAL LOW (ref 150–400)
RBC: 3.59 MIL/uL — ABNORMAL LOW (ref 4.22–5.81)
RDW: 13.1 % (ref 11.5–15.5)
WBC: 8.3 10*3/uL (ref 4.0–10.5)
nRBC: 0 % (ref 0.0–0.2)

## 2018-03-28 LAB — HEPATIC FUNCTION PANEL
ALT: 14 U/L (ref 0–44)
AST: 20 U/L (ref 15–41)
Albumin: 3.4 g/dL — ABNORMAL LOW (ref 3.5–5.0)
Alkaline Phosphatase: 69 U/L (ref 38–126)
Bilirubin, Direct: 0.1 mg/dL (ref 0.0–0.2)
Indirect Bilirubin: 0.6 mg/dL (ref 0.3–0.9)
Total Bilirubin: 0.7 mg/dL (ref 0.3–1.2)
Total Protein: 6.2 g/dL — ABNORMAL LOW (ref 6.5–8.1)

## 2018-03-28 LAB — INFLUENZA PANEL BY PCR (TYPE A & B)
Influenza A By PCR: NEGATIVE
Influenza B By PCR: NEGATIVE

## 2018-03-28 LAB — I-STAT CG4 LACTIC ACID, ED: Lactic Acid, Venous: 0.73 mmol/L (ref 0.5–1.9)

## 2018-03-28 LAB — C-REACTIVE PROTEIN: CRP: 5.1 mg/dL — ABNORMAL HIGH (ref <1.0)

## 2018-03-28 MED ORDER — ACETAMINOPHEN 325 MG PO TABS
650.0000 mg | ORAL_TABLET | Freq: Once | ORAL | Status: AC
  Administered 2018-03-28: 650 mg via ORAL
  Filled 2018-03-28: qty 2

## 2018-03-28 MED ORDER — SODIUM CHLORIDE 0.9 % IV BOLUS: 500.0000 mL | Freq: Once | INTRAVENOUS | Status: DC

## 2018-03-28 NOTE — ED Triage Notes (Signed)
Fever on Friday 103, fever returned today, shob with exertion, In triage temp 100.2, general weakness

## 2018-03-28 NOTE — ED Provider Notes (Signed)
[facility] DEPT Provider Note   CSN: [id number] Arrival date & time: 03/28/18  1527     History   Chief Complaint Chief Complaint  Patient presents with  . Fever  . Weakness    HPI Ryan [name] is a 84 y.o. male.  HPI   84 year old male with fever.  Presented with his son-in-law Dr Loletha Carrow, gastroenterology, who really helped out with history .  Patient began having rigors 2 days ago.  Felt generally weak.  Treated with Tylenol and pushed fluids and seemed to be improving yesterday.  Today began feeling increasingly weak again.  Further fevers.  Some shortness of breath with exertion.   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  He finished 6 weeks of ampicillin and Rocephin on July 22.  He had a repeat echocardiogram on 7/16 which was clear of vegetations.  Past Medical History:  Diagnosis Date  . Age-related macular degeneration   . Allergic rhinitis   . Amiodarone toxicity   . Aortic valve stenosis    a. s/p TAVR 06/2016.  . Arthritis    "hands, lower back" (04/30/2016)  . Asthma   . Cardiomyopathy ([place])    a. EF declined to 35-40% by echo 09/2017 (previously 50-55% after TAVR).  . Carotid artery obstruction    a. s/p bilat CEA.  . Chronic combined systolic and diastolic CHF (congestive heart failure) ([place])   . Chronic lower back pain   . Coronary arteriosclerosis in native artery    a. 2017 Cardiac catheterization demonstrated worsening CAD with 70% mid LCx, 80% OM1 and 80% mid RCA stenosis. b.  In 11/17, he underwent successful rotational atherectomy and DES to RCA.  . Diverticulitis of colon   . DJD (degenerative joint disease)   . Dyspnea    "constant but the degree changes"  . GERD (gastroesophageal reflux disease)   . History of elevated PSA 05/2009  . History of hiatal hernia   . HLD (hyperlipidemia)   . Hypertension   . Leukocytosis 10/2017  . Nocturnal  hypoxemia   . OSA (obstructive sleep apnea)    intolerant to CPAP  . PAF (paroxysmal atrial fibrillation) ([place])   . Paroxysmal supraventricular tachycardia ([place])   . Pneumonia    "when I was a kid"  . Presence of permanent cardiac pacemaker   . Primary malignant neoplasm of bladder ([place])   . Prostate cancer [facility])     Patient Active Problem List   Diagnosis Date Noted  . Hypertensive urgency   . Chest pain with high risk for cardiac etiology   . Unstable angina ([place]) 01/15/2018  . Claudication in peripheral vascular disease ([place]) 01/01/2018  . S/P AVR 12/02/2017  . Acute renal insufficiency 12/02/2017  . 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 Subacute bacterial endocarditis   . Enterococcal bacteremia 11/09/2017  . Intractable back pain 11/07/2017  . Elevated troponin 11/07/2017  . Urinary retention 11/07/2017  . Thrombocytopenia ([place]) 11/07/2017  . Hypertension   . Syncope 10/23/2017  . Decreased diffusion capacity 10/23/2017  . 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 Elevated TSH 06/30/2017  . Anticoagulation management encounter 08/19/2016  . CAD (coronary artery disease), native coronary artery 04/30/2016  . Tachy-brady syndrome ([place])   . Hypertensive heart disease with heart failure ([place])   . Pure hypercholesterolemia   . PAF (paroxysmal atrial fibrillation) ([place]) 03/18/2016  . Chronic combined systolic (congestive) and diastolic (congestive) heart failure ([place]) 03/18/2016  .  Atrial fibrillation with RVR ([place]) 03/18/2016  . Atrial fibrillation with rapid ventricular response ([place]) 03/18/2016  . Fatigue 01/17/2016  . DOE (dyspnea on exertion) 01/17/2016  . Coronary artery disease of native artery of native heart with stable angina pectoris ([place])   . Severe aortic stenosis 10/12/2015    Past Surgical History:  Procedure Laterality Date  . APPENDECTOMY    . CARDIAC CATHETERIZATION N/A 01/17/2016   Procedure: Right/Left Heart Cath and Coronary  Angiography;  Surgeon: Belva Crome, MD;  Location: [facility] CV LAB;  Service: Cardiovascular;  Laterality: N/A;  . CARDIAC CATHETERIZATION N/A 04/30/2016   Procedure: Coronary/Graft Atherectomy;  Surgeon: Sherren Mocha, MD;  Location: [facility] LAB;  Service: Cardiovascular;  Laterality: N/A;  . CAROTID ENDARTERECTOMY Bilateral   . CATARACT EXTRACTION W/ INTRAOCULAR LENS  IMPLANT, BILATERAL Bilateral   . COLON SURGERY  1981   Meckles Diverticulum with volvulus  . CORONARY ANGIOGRAPHY N/A 01/18/2018   Procedure: CORONARY ANGIOGRAPHY;  Surgeon: Lorretta Harp, MD;  Location: [facility] CV LAB;  Service: Cardiovascular;  Laterality: N/A;  . EP IMPLANTABLE DEVICE N/A 03/21/2016   Procedure: Pacemaker Implant;  Surgeon: Evans Lance, MD;  Location: [facility] LAB;  Service: Cardiovascular;  Laterality: N/A;  . EYE SURGERY    . INGUINAL HERNIA REPAIR Right 2009  . INSERT / REPLACE / REMOVE PACEMAKER    . INSERTION PROSTATE RADIATION SEED    . TEE WITHOUT CARDIOVERSION N/A 06/10/2016   Procedure: TRANSESOPHAGEAL ECHOCARDIOGRAM (TEE);  Surgeon: Sherren Mocha, MD;  Location: [facility];  Service: Open Heart Surgery;  Laterality: N/A;  . TEE WITHOUT CARDIOVERSION N/A 11/10/2017   Procedure: TRANSESOPHAGEAL ECHOCARDIOGRAM (TEE);  Surgeon: Acie Fredrickson Wonda Cheng, MD;  Location: [facility] ENDOSCOPY;  Service: Cardiovascular;  Laterality: N/A;  . TEE WITHOUT CARDIOVERSION N/A 12/15/2017   Procedure: TRANSESOPHAGEAL ECHOCARDIOGRAM (TEE);  Surgeon: Buford Dresser, MD;  Location: [facility] ENDOSCOPY;  Service: Cardiovascular;  Laterality: N/A;  . TONSILLECTOMY  ~ 1947  . TRANSCATHETER AORTIC VALVE REPLACEMENT, TRANSFEMORAL N/A 06/10/2016   Procedure: TRANSCATHETER AORTIC VALVE REPLACEMENT, TRANSFEMORAL;  Surgeon: Sherren Mocha, MD;  Location: [facility];  Service: Open Heart Surgery;  Laterality: N/A;        Home Medications    Prior to Admission medications   Medication Sig Start Date End Date Taking?  Authorizing Provider  albuterol (PROVENTIL) (2.5 MG/3ML) 0.083% nebulizer solution Take 3 mLs (2.5 mg total) by nebulization every 8 (eight) hours as needed for wheezing or shortness of breath. 10/13/17  Yes Nafziger, Tommi Rumps, NP  amLODipine (NORVASC) 5 MG tablet Take 1 tablet (5 mg total) by mouth daily. 01/20/18  Yes Kroeger, Daleen Snook M., PA-C  apixaban (ELIQUIS) 2.5 MG TABS tablet Take 1 tablet (2.5 mg total) by mouth 2 (two) times daily. 10/22/17  Yes Evans Lance, MD  atorvastatin (LIPITOR) 40 MG tablet Take 1 tablet (40 mg total) by mouth daily at 6 PM. 01/19/18  Yes Kroeger, Daleen Snook M., PA-C  budesonide-formoterol (SYMBICORT) 80-4.5 MCG/ACT inhaler Inhale 2 puffs into the lungs 2 (two) times daily. 10/20/17  Yes Lauraine Rinne, NP  cholecalciferol (VITAMIN D) 1000 units tablet Take 1,000 Units by mouth 2 (two) times a week.    Yes [provider]  isosorbide mononitrate (IMDUR) 60 MG 24 hr tablet Take 1 tablet (60 mg total) by mouth daily. 01/20/18  Yes Kroeger, Daleen Snook M., PA-C  lansoprazole (PREVACID) 15 MG capsule Take 1 capsule (15 mg total) by mouth daily at 12 noon. Patient  taking differently: Take 15 mg by mouth daily as needed (heartburn.).  01/28/18  Yes Dorothy Spark, MD  latanoprost (XALATAN) 0.005 % ophthalmic solution Place 1 drop into both eyes at bedtime.   Yes [provider]  levothyroxine (SYNTHROID, LEVOTHROID) 25 MCG tablet Take 0.5 tablets (12.5 mcg total) by mouth daily before breakfast. 10/21/17  Yes Dorothy Spark, MD  menthol-cetylpyridinium (CEPACOL) 3 MG lozenge Take 1 lozenge (3 mg total) by mouth as needed for sore throat. 10/24/17  Yes Lavina Hamman, MD  metoprolol succinate (TOPROL-XL) 100 MG 24 hr tablet Take 1 tablet (100 mg total) by mouth daily. Take with or immediately following a meal. 01/20/18  Yes Kroeger, Daleen Snook M., PA-C  nitroGLYCERIN (NITROSTAT) 0.4 MG SL tablet Place 1 tablet (0.4 mg total) under the tongue every 5 (five) minutes x 3 doses  as needed for chest pain. 01/19/18  Yes Kroeger, Daleen Snook M., PA-C  polyethylene glycol (MIRALAX / GLYCOLAX) packet Take 17 g by mouth daily. Patient taking differently: Take 17 g by mouth daily as needed for moderate constipation.  11/14/17  Yes Bonnell Public, MD  senna (SENOKOT) 8.6 MG TABS tablet Take 1 tablet (8.6 mg total) by mouth daily as needed for mild constipation. 11/14/17  Yes Bonnell Public, MD  vitamin B-12 (CYANOCOBALAMIN) 1000 MCG tablet Take 1,000 mcg by mouth daily.   Yes [provider]  lidocaine (LIDODERM) 5 % Place 1 patch onto the skin daily. Remove & Discard patch within 12 hours or as directed by MD Patient not taking: Reported on 03/28/2018 12/30/17   Dorothyann Peng, NP  nystatin cream (MYCOSTATIN) Apply 1 application topically 2 (two) times daily. Patient not taking: Reported on 03/28/2018 12/23/17   Dorothyann Peng, NP    Family History Family History  Problem Relation Age of Onset  . Heart disease Mother   . Heart disease Father     Social History Social History   Tobacco Use  . Smoking status: Never Smoker  . Smokeless tobacco: Never Used  Substance Use Topics  . Alcohol use: No    Alcohol/week: 0.0 standard drinks    Comment: 04/30/2016 "nothing in years"  . Drug use: No     Allergies   Ciprofloxacin; Hydrochlorothiazide; Sulfa antibiotics; Ace inhibitors; Losartan; Carvedilol; Lisinopril; and Soy allergy   Review of Systems Review of Systems  All systems reviewed and negative, other than as noted in HPI.  Physical Exam Updated Vital Signs BP 138/87 (BP Location: Right Arm)   Pulse 71   Temp 100.2 F (37.9 C) (Oral)   Resp 18   Ht 5\' 5"  (1.651 m)   Wt 57.6 kg   SpO2 95%   BMI 21.13 kg/m   Physical Exam  Constitutional: He appears well-developed and well-nourished. No distress.  HENT:  Head: Normocephalic and atraumatic.  Eyes: Conjunctivae are normal. Right eye exhibits no discharge. Left eye exhibits no discharge.    Neck: Neck supple.  Cardiovascular: Normal rate, regular rhythm and normal heart sounds. Exam reveals no gallop and no friction rub.  No murmur heard. Pulmonary/Chest: Effort normal and breath sounds normal. No respiratory distress.  Abdominal: Soft. He exhibits no distension. There is no tenderness.  Musculoskeletal: He exhibits no edema or tenderness.  Neurological: He is alert.  Skin: Skin is warm and dry.  Psychiatric: He has a normal mood and affect. His behavior is normal. Thought content normal.  Nursing note and vitals reviewed.    ED Treatments / Results  Labs (all labs ordered are listed, but only abnormal results are displayed) Labs Reviewed  BASIC METABOLIC PANEL - Abnormal; Notable for the following components:      Result Value   Glucose, Bld 119 (*)    BUN 34 (*)    Creatinine, Ser 2.03 (*)    GFR calc non Af Amer 28 (*)    GFR calc Af Amer 33 (*)    All other components within normal limits  CBC - Abnormal; Notable for the following components:   RBC 3.59 (*)    Hemoglobin 10.6 (*)    HCT 33.3 (*)    Platelets 120 (*)    All other components within normal limits  HEPATIC FUNCTION PANEL - Abnormal; Notable for the following components:   Total Protein 6.2 (*)    Albumin 3.4 (*)    All other components within normal limits  C-REACTIVE PROTEIN - Abnormal; Notable for the following components:   CRP 5.1 (*)    All other components within normal limits  CBG MONITORING, ED - Abnormal; Notable for the following components:   Glucose-Capillary 121 (*)    All other components within normal limits  CULTURE, BLOOD (ROUTINE X 2)  CULTURE, BLOOD (ROUTINE X 2)  URINALYSIS, ROUTINE W REFLEX MICROSCOPIC  INFLUENZA PANEL BY PCR (TYPE A & B)  I-STAT CG4 LACTIC ACID, ED    EKG EKG Interpretation  Date/Time:  Sunday March 28 2018 16:18:20 EDT Ventricular Rate:  69 PR Interval:    QRS Duration: 130 QT Interval:  445 QTC Calculation: 477 R Axis:   -27 Text  Interpretation:  Sinus rhythm Probable left atrial enlargement Left bundle branch block Confirmed by ,  ([id number]) on 03/28/2018 4:31:32 PM   Radiology Dg Chest 2 View  Result Date: 03/28/2018 CLINICAL DATA:  Fever 2 days ago returning today. Shortness of breath on exertion. Generalized weakness. EXAM: CHEST - 2 VIEW COMPARISON:  01/15/2018 FINDINGS: Status post endovascular aortic valve replacement. Heart is top-normal in size. No mediastinal or hilar masses. No evidence of adenopathy. Clear lungs.  No pleural effusion or pneumothorax. Stable left anterior chest wall sequential pacemaker. Skeletal structures are demineralized but intact. IMPRESSION: No acute cardiopulmonary disease. Electronically Signed   By: David  Ormond M.D.   On: 03/28/2018 17:43    Procedures Procedures (including critical care time)  Medications Ordered in ED Medications  acetaminophen (TYLENOL) tablet 650 mg (has no administration in time range)     Initial Impression / Assessment and Plan / ED Course  I have reviewed the triage vital signs and the nursing notes.  Pertinent labs & imaging results that were available during my care of the patient were reviewed by me and considered in my medical decision making (see chart for details).     84  year old male with fever and generalized weakness.  Unclear source.  He is nontoxic.  ED work-up was pretty reassuring.  Chest x-ray clear.  UA looks fine. Influenza negative. No leukocytosis.  Normal lactic acid. CRP a little elevated but trending down from what it had been. Other labs close to his baseline.  He really has few specific complaints to suggest a clear source.  Hopefully this is simply a viral illness of some kind.  Somewhat recent endocarditis in the setting of a prosthetic heart valve.  Blood cultures were drawn today.   At this point I think he is medically appropriate for discharge.  Advised to continue Tylenol as needed for the fever.  Stay  well-hydrated.  Return precautions were discussed. Outpt FU otherwise.   Final Clinical Impressions(s) / ED Diagnoses   Final diagnoses:  Febrile illness  Generalized weakness    ED Discharge Orders    None       Virgel Manifold, MD 03/28/18 1958

## 2018-03-29 ENCOUNTER — Telehealth (HOSPITAL_BASED_OUTPATIENT_CLINIC_OR_DEPARTMENT_OTHER): Payer: Self-pay | Admitting: *Deleted

## 2018-03-29 ENCOUNTER — Inpatient Hospital Stay (HOSPITAL_COMMUNITY)
Admission: EM | Admit: 2018-03-29 | Discharge: 2018-04-02 | DRG: 315 | Disposition: A | Discharge status: Home Health | Payer: Medicare Other | Attending: Internal Medicine | Admitting: Internal Medicine

## 2018-03-29 ENCOUNTER — Encounter: Payer: Self-pay | Admitting: Internal Medicine

## 2018-03-29 DIAGNOSIS — Z952 Presence of prosthetic heart valve: Secondary | ICD-10-CM | POA: Diagnosis not present

## 2018-03-29 DIAGNOSIS — T826XXA Infection and inflammatory reaction due to cardiac valve prosthesis, initial encounter: Principal | ICD-10-CM | POA: Diagnosis present

## 2018-03-29 DIAGNOSIS — G8929 Other chronic pain: Secondary | ICD-10-CM | POA: Diagnosis not present

## 2018-03-29 DIAGNOSIS — H3530 Unspecified macular degeneration: Secondary | ICD-10-CM | POA: Diagnosis present

## 2018-03-29 DIAGNOSIS — B952 Enterococcus as the cause of diseases classified elsewhere: Secondary | ICD-10-CM | POA: Diagnosis not present

## 2018-03-29 DIAGNOSIS — Z881 Allergy status to other antibiotic agents status: Secondary | ICD-10-CM | POA: Diagnosis not present

## 2018-03-29 DIAGNOSIS — I429 Cardiomyopathy, unspecified: Secondary | ICD-10-CM | POA: Diagnosis present

## 2018-03-29 DIAGNOSIS — I739 Peripheral vascular disease, unspecified: Secondary | ICD-10-CM | POA: Diagnosis present

## 2018-03-29 DIAGNOSIS — E039 Hypothyroidism, unspecified: Secondary | ICD-10-CM | POA: Diagnosis present

## 2018-03-29 DIAGNOSIS — I480 Paroxysmal atrial fibrillation: Secondary | ICD-10-CM | POA: Diagnosis not present

## 2018-03-29 DIAGNOSIS — R5381 Other malaise: Secondary | ICD-10-CM | POA: Diagnosis not present

## 2018-03-29 DIAGNOSIS — Z950 Presence of cardiac pacemaker: Secondary | ICD-10-CM | POA: Insufficient documentation

## 2018-03-29 DIAGNOSIS — M1990 Unspecified osteoarthritis, unspecified site: Secondary | ICD-10-CM | POA: Diagnosis present

## 2018-03-29 DIAGNOSIS — I350 Nonrheumatic aortic (valve) stenosis: Secondary | ICD-10-CM

## 2018-03-29 DIAGNOSIS — Z888 Allergy status to other drugs, medicaments and biological substances status: Secondary | ICD-10-CM | POA: Diagnosis not present

## 2018-03-29 DIAGNOSIS — D696 Thrombocytopenia, unspecified: Secondary | ICD-10-CM | POA: Diagnosis not present

## 2018-03-29 DIAGNOSIS — R6883 Chills (without fever): Secondary | ICD-10-CM | POA: Diagnosis not present

## 2018-03-29 DIAGNOSIS — Z955 Presence of coronary angioplasty implant and graft: Secondary | ICD-10-CM

## 2018-03-29 DIAGNOSIS — I2510 Atherosclerotic heart disease of native coronary artery without angina pectoris: Secondary | ICD-10-CM | POA: Diagnosis present

## 2018-03-29 DIAGNOSIS — Y831 Surgical operation with implant of artificial internal device as the cause of abnormal reaction of the patient, or of later complication, without mention of misadventure at the time of the procedure: Secondary | ICD-10-CM | POA: Diagnosis present

## 2018-03-29 DIAGNOSIS — I10 Essential (primary) hypertension: Secondary | ICD-10-CM | POA: Diagnosis present

## 2018-03-29 DIAGNOSIS — I130 Hypertensive heart and chronic kidney disease with heart failure and stage 1 through stage 4 chronic kidney disease, or unspecified chronic kidney disease: Secondary | ICD-10-CM | POA: Diagnosis present

## 2018-03-29 DIAGNOSIS — J45909 Unspecified asthma, uncomplicated: Secondary | ICD-10-CM | POA: Diagnosis present

## 2018-03-29 DIAGNOSIS — Z79899 Other long term (current) drug therapy: Secondary | ICD-10-CM

## 2018-03-29 DIAGNOSIS — Z8546 Personal history of malignant neoplasm of prostate: Secondary | ICD-10-CM | POA: Diagnosis not present

## 2018-03-29 DIAGNOSIS — R7881 Bacteremia: Secondary | ICD-10-CM | POA: Diagnosis not present

## 2018-03-29 DIAGNOSIS — M549 Dorsalgia, unspecified: Secondary | ICD-10-CM | POA: Diagnosis not present

## 2018-03-29 DIAGNOSIS — Z91018 Allergy to other foods: Secondary | ICD-10-CM

## 2018-03-29 DIAGNOSIS — N289 Disorder of kidney and ureter, unspecified: Secondary | ICD-10-CM

## 2018-03-29 DIAGNOSIS — N184 Chronic kidney disease, stage 4 (severe): Secondary | ICD-10-CM | POA: Diagnosis present

## 2018-03-29 DIAGNOSIS — R5383 Other fatigue: Secondary | ICD-10-CM | POA: Diagnosis not present

## 2018-03-29 DIAGNOSIS — I351 Nonrheumatic aortic (valve) insufficiency: Secondary | ICD-10-CM | POA: Diagnosis not present

## 2018-03-29 DIAGNOSIS — E785 Hyperlipidemia, unspecified: Secondary | ICD-10-CM | POA: Diagnosis present

## 2018-03-29 DIAGNOSIS — D72829 Elevated white blood cell count, unspecified: Secondary | ICD-10-CM | POA: Diagnosis not present

## 2018-03-29 DIAGNOSIS — N179 Acute kidney failure, unspecified: Secondary | ICD-10-CM | POA: Diagnosis present

## 2018-03-29 DIAGNOSIS — Z7951 Long term (current) use of inhaled steroids: Secondary | ICD-10-CM

## 2018-03-29 DIAGNOSIS — G4733 Obstructive sleep apnea (adult) (pediatric): Secondary | ICD-10-CM | POA: Diagnosis present

## 2018-03-29 DIAGNOSIS — Z95810 Presence of automatic (implantable) cardiac defibrillator: Secondary | ICD-10-CM | POA: Diagnosis not present

## 2018-03-29 DIAGNOSIS — I5042 Chronic combined systolic (congestive) and diastolic (congestive) heart failure: Secondary | ICD-10-CM | POA: Diagnosis present

## 2018-03-29 DIAGNOSIS — R531 Weakness: Secondary | ICD-10-CM | POA: Diagnosis not present

## 2018-03-29 DIAGNOSIS — R42 Dizziness and giddiness: Secondary | ICD-10-CM | POA: Diagnosis not present

## 2018-03-29 DIAGNOSIS — Z7901 Long term (current) use of anticoagulants: Secondary | ICD-10-CM

## 2018-03-29 DIAGNOSIS — Z8249 Family history of ischemic heart disease and other diseases of the circulatory system: Secondary | ICD-10-CM

## 2018-03-29 DIAGNOSIS — I38 Endocarditis, valve unspecified: Secondary | ICD-10-CM | POA: Diagnosis present

## 2018-03-29 DIAGNOSIS — I495 Sick sinus syndrome: Secondary | ICD-10-CM | POA: Diagnosis not present

## 2018-03-29 DIAGNOSIS — K219 Gastro-esophageal reflux disease without esophagitis: Secondary | ICD-10-CM | POA: Diagnosis present

## 2018-03-29 DIAGNOSIS — Z882 Allergy status to sulfonamides status: Secondary | ICD-10-CM

## 2018-03-29 DIAGNOSIS — Z8551 Personal history of malignant neoplasm of bladder: Secondary | ICD-10-CM | POA: Diagnosis not present

## 2018-03-29 DIAGNOSIS — Z8679 Personal history of other diseases of the circulatory system: Secondary | ICD-10-CM | POA: Diagnosis not present

## 2018-03-29 HISTORY — DX: Other specified abnormal findings of blood chemistry: R79.89

## 2018-03-29 HISTORY — DX: Other disorders of lung: J98.4

## 2018-03-29 HISTORY — DX: Sick sinus syndrome: I49.5

## 2018-03-29 HISTORY — DX: Adverse effect of other antidysrhythmic drugs, initial encounter: T46.2X5A

## 2018-03-29 HISTORY — DX: Peripheral vascular disease, unspecified: I73.9

## 2018-03-29 HISTORY — DX: Retention of urine, unspecified: R33.9

## 2018-03-29 HISTORY — DX: Presence of prosthetic heart valve: Z95.2

## 2018-03-29 HISTORY — DX: Abnormal results of pulmonary function studies: R94.2

## 2018-03-29 HISTORY — DX: Syncope and collapse: R55

## 2018-03-29 HISTORY — DX: Endocarditis, valve unspecified: I38

## 2018-03-29 HISTORY — DX: Infection and inflammatory reaction due to cardiac valve prosthesis, initial encounter: T82.6XXA

## 2018-03-29 HISTORY — DX: Dorsalgia, unspecified: M54.9

## 2018-03-29 HISTORY — DX: Thrombocytopenia, unspecified: D69.6

## 2018-03-29 HISTORY — DX: Chronic combined systolic (congestive) and diastolic (congestive) heart failure: I50.42

## 2018-03-29 HISTORY — DX: Hypertensive heart disease with heart failure: I11.0

## 2018-03-29 LAB — COMPREHENSIVE METABOLIC PANEL
ALBUMIN: 3.3 g/dL — AB (ref 3.5–5.0)
ALK PHOS: 73 U/L (ref 38–126)
ALT: 13 U/L (ref 0–44)
ANION GAP: 8 (ref 5–15)
AST: 21 U/L (ref 15–41)
BUN: 35 mg/dL — ABNORMAL HIGH (ref 8–23)
CALCIUM: 8.8 mg/dL — AB (ref 8.9–10.3)
CO2: 24 mmol/L (ref 22–32)
Chloride: 107 mmol/L (ref 98–111)
Creatinine, Ser: 2.3 mg/dL — ABNORMAL HIGH (ref 0.61–1.24)
GFR calc non Af Amer: 24 mL/min — ABNORMAL LOW (ref >60)
GFR, EST AFRICAN AMERICAN: 28 mL/min — AB (ref >60)
Glucose, Bld: 159 mg/dL — ABNORMAL HIGH (ref 70–99)
POTASSIUM: 4.1 mmol/L (ref 3.5–5.1)
Sodium: 139 mmol/L (ref 135–145)
Total Bilirubin: 0.8 mg/dL (ref 0.3–1.2)
Total Protein: 5.9 g/dL — ABNORMAL LOW (ref 6.5–8.1)

## 2018-03-29 LAB — CBC WITH DIFFERENTIAL/PLATELET
ABS IMMATURE GRANULOCYTES: 0.06 10*3/uL (ref 0.00–0.07)
BASOS ABS: 0 10*3/uL (ref 0.0–0.1)
BASOS PCT: 0 %
EOS ABS: 0 10*3/uL (ref 0.0–0.5)
Eosinophils Relative: 0 %
HCT: 33.1 % — ABNORMAL LOW (ref 39.0–52.0)
Hemoglobin: 10.4 g/dL — ABNORMAL LOW (ref 13.0–17.0)
IMMATURE GRANULOCYTES: 1 %
Lymphocytes Relative: 8 %
Lymphs Abs: 1 10*3/uL (ref 0.7–4.0)
MCH: 29.5 pg (ref 26.0–34.0)
MCHC: 31.4 g/dL (ref 30.0–36.0)
MCV: 93.8 fL (ref 80.0–100.0)
Monocytes Absolute: 1.2 10*3/uL — ABNORMAL HIGH (ref 0.1–1.0)
Monocytes Relative: 10 %
NEUTROS ABS: 10.2 10*3/uL — AB (ref 1.7–7.7)
NEUTROS PCT: 81 %
NRBC: 0 % (ref 0.0–0.2)
PLATELETS: 123 10*3/uL — AB (ref 150–400)
RBC: 3.53 MIL/uL — ABNORMAL LOW (ref 4.22–5.81)
RDW: 13.2 % (ref 11.5–15.5)
WBC: 12.5 10*3/uL — ABNORMAL HIGH (ref 4.0–10.5)

## 2018-03-29 LAB — BLOOD CULTURE ID PANEL (REFLEXED)

## 2018-03-29 LAB — I-STAT CG4 LACTIC ACID, ED: LACTIC ACID, VENOUS: 1.26 mmol/L (ref 0.5–1.9)

## 2018-03-29 MED ORDER — POLYETHYLENE GLYCOL 3350 17 G PO PACK: 17.0000 g | PACK | Freq: Every day | ORAL | Status: DC | PRN

## 2018-03-29 MED ORDER — METOPROLOL SUCCINATE ER 100 MG PO TB24
100.0000 mg | ORAL_TABLET | Freq: Every day | ORAL | Status: DC
  Administered 2018-03-30 – 2018-04-02 (×4): 100 mg via ORAL
  Filled 2018-03-29 (×5): qty 1

## 2018-03-29 MED ORDER — LIDOCAINE 5 % EX PTCH
1.0000 | MEDICATED_PATCH | Freq: Every day | CUTANEOUS | Status: DC
  Administered 2018-03-29 – 2018-04-02 (×5): 1 via TRANSDERMAL
  Filled 2018-03-29 (×6): qty 1

## 2018-03-29 MED ORDER — DOCUSATE SODIUM 100 MG PO CAPS
100.0000 mg | ORAL_CAPSULE | Freq: Two times a day (BID) | ORAL | Status: DC
  Administered 2018-03-29 – 2018-03-31 (×2): 100 mg via ORAL
  Filled 2018-03-29 (×10): qty 1

## 2018-03-29 MED ORDER — SODIUM CHLORIDE 0.9 % IV SOLN
2.0000 g | Freq: Two times a day (BID) | INTRAVENOUS | Status: DC
  Administered 2018-03-29: 2 g via INTRAVENOUS
  Filled 2018-03-29 (×2): qty 2000

## 2018-03-29 MED ORDER — ACETAMINOPHEN 325 MG PO TABS: 650.0000 mg | ORAL_TABLET | Freq: Four times a day (QID) | ORAL | Status: DC | PRN

## 2018-03-29 MED ORDER — AMLODIPINE BESYLATE 5 MG PO TABS
5.0000 mg | ORAL_TABLET | Freq: Every day | ORAL | Status: DC
  Administered 2018-03-30 – 2018-04-02 (×4): 5 mg via ORAL
  Filled 2018-03-29 (×5): qty 1

## 2018-03-29 MED ORDER — PANTOPRAZOLE SODIUM 20 MG PO TBEC
20.0000 mg | DELAYED_RELEASE_TABLET | Freq: Every day | ORAL | Status: DC
  Administered 2018-03-30 – 2018-04-02 (×4): 20 mg via ORAL
  Filled 2018-03-29 (×6): qty 1

## 2018-03-29 MED ORDER — APIXABAN 2.5 MG PO TABS
2.5000 mg | ORAL_TABLET | Freq: Two times a day (BID) | ORAL | Status: DC
  Administered 2018-03-29 – 2018-04-02 (×8): 2.5 mg via ORAL
  Filled 2018-03-29 (×8): qty 1

## 2018-03-29 MED ORDER — LATANOPROST 0.005 % OP SOLN
1.0000 [drp] | Freq: Every day | OPHTHALMIC | Status: DC
  Administered 2018-03-29 – 2018-04-01 (×4): 1 [drp] via OPHTHALMIC
  Filled 2018-03-29: qty 2.5

## 2018-03-29 MED ORDER — ACETAMINOPHEN-CODEINE #3 300-30 MG PO TABS
1.0000 | ORAL_TABLET | Freq: Every evening | ORAL | Status: DC | PRN
  Administered 2018-03-29: 1 via ORAL
  Filled 2018-03-29: qty 1

## 2018-03-29 MED ORDER — MOMETASONE FURO-FORMOTEROL FUM 100-5 MCG/ACT IN AERO
2.0000 | INHALATION_SPRAY | Freq: Two times a day (BID) | RESPIRATORY_TRACT | Status: DC
  Administered 2018-03-29 – 2018-03-30 (×2): 2 via RESPIRATORY_TRACT
  Filled 2018-03-29: qty 8.8

## 2018-03-29 MED ORDER — ONDANSETRON HCL 4 MG/2ML IJ SOLN: 4.0000 mg | Freq: Four times a day (QID) | INTRAMUSCULAR | Status: DC | PRN

## 2018-03-29 MED ORDER — ACETAMINOPHEN 650 MG RE SUPP: 650.0000 mg | Freq: Four times a day (QID) | RECTAL | Status: DC | PRN

## 2018-03-29 MED ORDER — ONDANSETRON HCL 4 MG PO TABS: 4.0000 mg | ORAL_TABLET | Freq: Four times a day (QID) | ORAL | Status: DC | PRN

## 2018-03-29 MED ORDER — LEVOTHYROXINE SODIUM 25 MCG PO TABS
12.5000 ug | ORAL_TABLET | Freq: Every day | ORAL | Status: DC
  Administered 2018-03-30 – 2018-04-02 (×4): 12.5 ug via ORAL
  Filled 2018-03-29: qty 0.5
  Filled 2018-03-29 (×3): qty 1

## 2018-03-29 MED ORDER — SENNA 8.6 MG PO TABS: 1.0000 | ORAL_TABLET | Freq: Every day | ORAL | Status: DC | PRN

## 2018-03-29 MED ORDER — SODIUM CHLORIDE 0.9 % IV SOLN
2.0000 g | Freq: Three times a day (TID) | INTRAVENOUS | Status: DC
  Administered 2018-03-29 – 2018-04-02 (×12): 2 g via INTRAVENOUS
  Filled 2018-03-29 (×13): qty 2000

## 2018-03-29 MED ORDER — SODIUM CHLORIDE 0.9 % IV SOLN
2.0000 g | Freq: Two times a day (BID) | INTRAVENOUS | Status: DC
  Administered 2018-03-29 – 2018-04-02 (×9): 2 g via INTRAVENOUS
  Filled 2018-03-29 (×9): qty 20

## 2018-03-29 MED ORDER — ISOSORBIDE MONONITRATE ER 60 MG PO TB24
60.0000 mg | ORAL_TABLET | Freq: Every day | ORAL | Status: DC
  Administered 2018-03-30 – 2018-04-02 (×4): 60 mg via ORAL
  Filled 2018-03-29 (×5): qty 1

## 2018-03-29 MED ORDER — ALBUTEROL SULFATE (2.5 MG/3ML) 0.083% IN NEBU: 2.5000 mg | INHALATION_SOLUTION | Freq: Three times a day (TID) | RESPIRATORY_TRACT | Status: DC | PRN

## 2018-03-29 MED ORDER — ATORVASTATIN CALCIUM 40 MG PO TABS
40.0000 mg | ORAL_TABLET | Freq: Every day | ORAL | Status: DC
  Administered 2018-03-29 – 2018-04-01 (×4): 40 mg via ORAL
  Filled 2018-03-29 (×4): qty 1

## 2018-03-29 NOTE — ED Notes (Signed)
Yates, MD at bedside 

## 2018-03-29 NOTE — Progress Notes (Signed)
Pharmacy - Brief Note  Received notification that patient's blood cultures with GPC 2/2 from 10/27 is enterococcus.  Patient with history of enterococcus endocarditis in June s/p 6 weeks of ampicillin/ceftriaxone.    Called patient to inform him of results.  He will report back to ED.  He said he prefers to go to [facility] (presented yesterday at [facility])  Doreene Eland, PharmD, BCPS.   Work Cell: [phone] 03/29/2018 10:11 AM

## 2018-03-29 NOTE — Progress Notes (Signed)
PHARMACY NOTE:  ANTIMICROBIAL RENAL DOSAGE ADJUSTMENT  Current antimicrobial regimen includes a mismatch between antimicrobial dosage and estimated renal function.  As per policy approved by the Pharmacy & Therapeutics and Medical Executive Committees, the antimicrobial dosage will be adjusted accordingly.  Current antimicrobial dosage:  Ampicillin 2 gm every 12 hours  Indication: Enterococcal bacteremia  Renal Function:  Estimated Creatinine Clearance: 19.1 mL/min (A) (by C-G formula based on SCr of 2.3 mg/dL (H)). []      On intermittent HD, scheduled: []      On CRRT    Antimicrobial dosage has been changed to:  Ampicillin 2 gm every 8 hours   Additional comments:   Thank you for allowing pharmacy to be a part of this patient's care.  Susa Raring, [facility] , PharmD, BCPS Infectious Diseases Clinical Pharmacist Phone: [phone] 03/29/2018 4:28 PM

## 2018-03-29 NOTE — Progress Notes (Signed)
    [facility] has been requested to perform a transesophageal echocardiogram on Ryan [name] for bacteremia.  After careful review of history and examination, the risks and benefits of transesophageal echocardiogram have been explained including risks of esophageal damage, perforation (1:10,000 risk), bleeding, pharyngeal hematoma as well as other potential complications associated with conscious sedation including aspiration, arrhythmia, respiratory failure and death. Alternatives to treatment were discussed, questions were answered. This will be his 3rd TEE procedure. Patient is willing to proceed.   Hgb is 10.4, appears to be stable. Plts are 123.   The procedure is scheduled for 03/30/18 at 1500 with Dr. Marlou Porch.  Daune Perch, NP  03/29/2018 4:29 PM

## 2018-03-29 NOTE — ED Notes (Signed)
Pt complaining of back pain with hx of. Lorin Mercy, MD messaged.

## 2018-03-29 NOTE — Consult Note (Signed)
[facility] Infectious Disease    Date of Admission:  03/29/2018           Day 1 ampicillin        Day 1 ceftriaxone       Reason for Consult: Automatic consultation for enterococcal bacteremia     Assessment: He has recurrent enterococcal bacteremia raising concern for persistent endocarditis or pacemaker lead infection.  Repeat TEE on 12/15/2017 showed that the vegetations had resolved but that does not mean that his infection was cured.  We have started him back on ampicillin and ceftriaxone.  I will order repeat blood cultures tomorrow.  I recommend repeat TEE.  Plan: 1. Continue ampicillin and ceftriaxone 2. Repeat blood cultures tomorrow 3. Recommend repeat TEE  Active Problems:   Enterococcal bacteremia   Prosthetic valve endocarditis (HCC)   S/P AVR   Presence of permanent cardiac pacemaker   Acute renal insufficiency   Bacteremia due to Enterococcus   Scheduled Meds: . [START ON 03/30/2018] amLODipine  5 mg Oral Daily  . apixaban  2.5 mg Oral BID  . atorvastatin  40 mg Oral q1800  . docusate sodium  100 mg Oral BID  . [START ON 03/30/2018] isosorbide mononitrate  60 mg Oral Daily  . latanoprost  1 drop Both Eyes QHS  . [START ON 03/30/2018] levothyroxine  12.5 mcg Oral QAC breakfast  . [START ON 03/30/2018] metoprolol succinate  100 mg Oral Daily  . mometasone-formoterol  2 puff Inhalation BID  . pantoprazole  20 mg Oral Daily   Continuous Infusions: . ampicillin (OMNIPEN) IV Stopped (03/29/18 1246)  . cefTRIAXone (ROCEPHIN)  IV 2 g (03/29/18 1247)   PRN Meds:.acetaminophen **OR** acetaminophen, albuterol, ondansetron **OR** ondansetron (ZOFRAN) IV, polyethylene glycol, senna  HPI: Ryan [name] is a 84 y.o. male with a history of aortic stenosis, paroxysmal atrial fibrillation and tachybradycardia syndrome.  He has a permanent pacemaker and underwent TAVR in January 2018.  He was admitted to the hospital in June of this year with  enterococcal bacteremia and severe back pain.  CT scan could not confirm discitis but it was suspected.  TEE revealed to medium to large vegetations on his prosthetic aortic valve.  He was treated with IV ampicillin and ceftriaxone and improved.  His back pain resolved and he returned to his usual activities.  He completed 6 weeks of IV antibiotic therapy on 12/21/2017.  He did well until several days ago when he had sudden onset of high fever and profound weakness.  He was seen in the emergency department yesterday.  Both sets of blood cultures are growing enterococcus again.   Review of Systems: Review of Systems  Constitutional: Positive for chills, fever, malaise/fatigue and weight loss. Negative for diaphoresis.  Respiratory: Negative for cough and shortness of breath.   Cardiovascular: Negative for chest pain.  Gastrointestinal: Negative for abdominal pain, diarrhea, nausea and vomiting.  Genitourinary: Negative for dysuria.  Musculoskeletal:       He has chronic, mild back pain which is stable  Skin: Negative for rash.  Neurological: Negative for headaches.    Past Medical History:  Diagnosis Date  . Age-related macular degeneration   . Allergic rhinitis   . 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 Arthritis    "hands, lower back" (04/30/2016)  . Asthma   . Carotid artery obstruction    a. s/p bilat CEA.  . Chronic combined systolic (congestive) and diastolic (congestive) heart failure (HCC)  03/18/2016  . Chronic lower back pain   . Claudication in peripheral vascular disease ([place]) 01/01/2018   Claudication  . Coronary arteriosclerosis in native artery    a. 2017 Cardiac catheterization demonstrated worsening CAD with 70% mid LCx, 80% OM1 and 80% mid RCA stenosis. b.  In 11/17, he underwent successful rotational atherectomy and DES to RCA.  Marland Kitchen Decreased diffusion capacity 10/23/2017  . Diverticulitis of colon   . DJD (degenerative joint disease)   . Elevated TSH 06/30/2017  .  GERD (gastroesophageal reflux disease)   . History of hiatal hernia   . HLD (hyperlipidemia)   . Hypertension   . Hypertensive heart disease with heart failure ([place])   . Intractable back pain 11/07/2017  . OSA (obstructive sleep apnea)    intolerant to CPAP  . PAF (paroxysmal atrial fibrillation) ([place])   . Paroxysmal supraventricular tachycardia ([place])   . Presence of permanent cardiac pacemaker   . Primary malignant neoplasm of bladder ([place])   . Prostate cancer ([place])   . Prosthetic valve endocarditis ([place]) 11/10/2017   enterococcal bacteremia  . S/P AVR 12/02/2017  . Syncope 10/23/2017  . Tachy-brady syndrome ([place])   . Thrombocytopenia ([place]) 11/07/2017  . Urinary retention 11/07/2017    Social History   Tobacco Use  . Smoking status: Never Smoker  . Smokeless tobacco: Never Used  Substance Use Topics  . Alcohol use: No    Alcohol/week: 0.0 standard drinks    Comment: 04/30/2016 "nothing in years"  . Drug use: No    Family History  Problem Relation Age of Onset  . Heart disease Mother   . Heart disease Father    Allergies  Allergen Reactions  . Ciprofloxacin Anaphylaxis  . Hydrochlorothiazide Anaphylaxis  . Sulfa Antibiotics Anaphylaxis  . Ace Inhibitors Cough  . Losartan Cough  . Carvedilol Cough    Pt states it "causes him too cough."  . Lisinopril Cough    Pt reports worsening cough and "feeling wheezy."  . Soy Allergy Nausea And Vomiting    OBJECTIVE: Blood pressure (!) 109/52, pulse 63, temperature 98 F (36.7 C), temperature source Oral, resp. rate 13, SpO2 98 %.  Physical Exam  Lab Results Lab Results  Component Value Date   WBC 12.5 (H) 03/29/2018   HGB 10.4 (L) 03/29/2018   HCT 33.1 (L) 03/29/2018   MCV 93.8 03/29/2018   PLT 123 (L) 03/29/2018    Lab Results  Component Value Date   CREATININE 2.30 (H) 03/29/2018   BUN 35 (H) 03/29/2018   NA 139 03/29/2018   K 4.1 03/29/2018   CL 107 03/29/2018   CO2 24 03/29/2018    Lab Results  Component  Value Date   ALT 13 03/29/2018   AST 21 03/29/2018   ALKPHOS 73 03/29/2018   BILITOT 0.8 03/29/2018     Microbiology: Recent Results (from the past 240 hour(s))  Blood culture (routine x 2)     Status: None (Preliminary result)   Collection Time: 03/28/18  4:56 PM  Result Value Ref Range Status   Specimen Description   Final    BLOOD RIGHT ARM Performed at [facility], [place] [address] [place], [place] [address]    Special Requests   Final    BAA BCHV Performed at [facility], [place] [address], [place], [place] [address]    Culture  Setup Time   Final    GRAM POSITIVE COCCI IN BOTH AEROBIC AND ANAEROBIC BOTTLES CRITICAL VALUE NOTED.  VALUE IS CONSISTENT WITH PREVIOUSLY REPORTED AND CALLED VALUE. Performed at [facility], [place] [address], [place], [place] [address]    Culture GRAM POSITIVE COCCI  Final   Report Status PENDING  Incomplete  Blood culture (routine x 2)     Status: None (Preliminary result)   Collection Time: 03/28/18  5:20 PM  Result Value Ref Range Status   Specimen Description   Final    BLOOD LEFT ARM Performed at [facility] [address] [place], [place] [address]    Special Requests   Final    BAA BCHV Performed at [facility], [place] [address] [place], [place] [address]    Culture  Setup Time   Final    GRAM POSITIVE COCCI IN BOTH AEROBIC AND ANAEROBIC BOTTLES CRITICAL RESULT CALLED TO, READ BACK BY AND VERIFIED WITH: RN A HARTLEY [id number] Performed at [facility], [address] [place], [place] [address]    Culture GRAM POSITIVE COCCI  Final   Report Status PENDING  Incomplete  Blood Culture ID Panel (Reflexed)     Status: Abnormal   Collection Time: 03/28/18  5:20 PM  Result Value Ref Range Status   Enterococcus species DETECTED (A) NOT DETECTED Final    Comment: CRITICAL RESULT CALLED TO, READ BACK BY AND VERIFIED WITH: RN A HARTLEY [id number]  [id number] MLM    Vancomycin resistance NOT DETECTED NOT DETECTED Final   Listeria monocytogenes NOT DETECTED NOT DETECTED Final   Staphylococcus species NOT DETECTED NOT DETECTED Final   Staphylococcus aureus (BCID) NOT DETECTED NOT DETECTED Final   Streptococcus species NOT DETECTED NOT DETECTED Final   Streptococcus agalactiae NOT DETECTED NOT DETECTED Final   Streptococcus pneumoniae NOT DETECTED NOT DETECTED Final   Streptococcus pyogenes NOT DETECTED NOT DETECTED Final   Acinetobacter baumannii NOT DETECTED NOT DETECTED Final   Enterobacteriaceae species NOT DETECTED NOT DETECTED Final   Enterobacter cloacae complex NOT DETECTED NOT DETECTED Final   Escherichia coli NOT DETECTED NOT DETECTED Final   Klebsiella oxytoca NOT DETECTED NOT DETECTED Final   Klebsiella pneumoniae NOT DETECTED NOT DETECTED Final   Proteus species NOT DETECTED NOT DETECTED Final   Serratia marcescens NOT DETECTED NOT DETECTED Final   Haemophilus influenzae NOT DETECTED NOT DETECTED Final   Neisseria meningitidis NOT DETECTED NOT DETECTED Final   Pseudomonas aeruginosa NOT DETECTED NOT DETECTED Final   Candida albicans NOT DETECTED NOT DETECTED Final   Candida glabrata NOT DETECTED NOT DETECTED Final   Candida krusei NOT DETECTED NOT DETECTED Final   Candida parapsilosis NOT DETECTED NOT DETECTED Final   Candida tropicalis NOT DETECTED NOT DETECTED Final    Comment: Performed at [facility], [address], [place], [place] [address]    Michel Bickers, [facility] [phone] pager   [phone] cell 03/29/2018, 1:43 PM

## 2018-03-29 NOTE — ED Provider Notes (Signed)
[facility] EMERGENCY DEPARTMENT Provider Note   CSN: [id number] Arrival date & time: 03/29/18  1031     History   Chief Complaint Chief Complaint  Patient presents with  . Abnormal Lab    HPI Ryan [name] is a 84 y.o. male.  HPI  84 year old male with a history of a TAVR in 2018 as well as prior endocarditis earlier this year presents with fever and positive blood cultures.  For about 3 days he is been having on and off fevers, as high as 103.  Most recently had a fever around 6 AM at 101 and took Tylenol.  Went to [facility] were no obvious source found and blood cultures were obtained.  Was called this morning due to positive blood cultures for enterococcus.  The patient has been feeling weak and dizzy.  He gets a headache whenever the fever comes on.  No chest pain or shortness of breath.  Past Medical History:  Diagnosis Date  . Age-related macular degeneration   . Allergic rhinitis   . 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 Arthritis    "hands, lower back" (04/30/2016)  . Asthma   . Carotid artery obstruction    a. s/p bilat CEA.  . Chronic combined systolic (congestive) and diastolic (congestive) heart failure ([place]) 03/18/2016  . Chronic lower back pain   . Claudication in peripheral vascular disease ([place]) 01/01/2018   Claudication  . Coronary arteriosclerosis in native artery    a. 2017 Cardiac catheterization demonstrated worsening CAD with 70% mid LCx, 80% OM1 and 80% mid RCA stenosis. b.  In 11/17, he underwent successful rotational atherectomy and DES to RCA.  Marland Kitchen Decreased diffusion capacity 10/23/2017  . Diverticulitis of colon   . DJD (degenerative joint disease)   . Elevated TSH 06/30/2017  . GERD (gastroesophageal reflux disease)   . History of hiatal hernia   . HLD (hyperlipidemia)   . Hypertension   . Hypertensive heart disease with heart failure ([place])   . Intractable back pain 11/07/2017  . OSA (obstructive sleep apnea)     intolerant to CPAP  . PAF (paroxysmal atrial fibrillation) ([place])   . Paroxysmal supraventricular tachycardia ([place])   . Presence of permanent cardiac pacemaker   . Primary malignant neoplasm of bladder ([place])   . Prostate cancer ([place])   . Prosthetic valve endocarditis ([place]) 11/10/2017   enterococcal bacteremia  . S/P AVR 12/02/2017  . Syncope 10/23/2017  . Tachy-brady syndrome ([place])   . Thrombocytopenia ([place]) 11/07/2017  . Urinary retention 11/07/2017    Patient Active Problem List   Diagnosis Date Noted  . Presence of permanent cardiac pacemaker 03/29/2018  . Bacteremia due to Enterococcus 03/29/2018  . Hypertensive urgency   . Chest pain with high risk for cardiac etiology   . Unstable angina ([place]) 01/15/2018  . Claudication in peripheral vascular disease ([place]) 01/01/2018  . S/P AVR 12/02/2017  . Acute renal insufficiency 12/02/2017  . Prosthetic valve endocarditis ([place]) 11/10/2017  . Enterococcal bacteremia 11/09/2017  . Intractable back pain 11/07/2017  . Elevated troponin 11/07/2017  . Urinary retention 11/07/2017  . Thrombocytopenia ([place]) 11/07/2017  . Hypertension   . Syncope 10/23/2017  . Decreased diffusion capacity 10/23/2017  . 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 Elevated TSH 06/30/2017  . Anticoagulation management encounter 08/19/2016  . CAD (coronary artery disease), native coronary artery 04/30/2016  . Tachy-brady syndrome ([place])   . Hypertensive heart disease with heart failure ([place])   . Pure hypercholesterolemia   .  PAF (paroxysmal atrial fibrillation) ([place]) 03/18/2016  . Chronic combined systolic (congestive) and diastolic (congestive) heart failure ([place]) 03/18/2016  . Atrial fibrillation with RVR ([place]) 03/18/2016  . Atrial fibrillation with rapid ventricular response ([place]) 03/18/2016  . DOE (dyspnea on exertion) 01/17/2016  . Coronary artery disease of native artery of native heart with stable angina pectoris ([place])   . Severe aortic stenosis  10/12/2015    Past Surgical History:  Procedure Laterality Date  . APPENDECTOMY    . CARDIAC CATHETERIZATION N/A 01/17/2016   Procedure: Right/Left Heart Cath and Coronary Angiography;  Surgeon: Belva Crome, MD;  Location: [facility];  Service: Cardiovascular;  Laterality: N/A;  . CARDIAC CATHETERIZATION N/A 04/30/2016   Procedure: Coronary/Graft Atherectomy;  Surgeon: Sherren Mocha, MD;  Location: [facility];  Service: Cardiovascular;  Laterality: N/A;  . CAROTID ENDARTERECTOMY Bilateral   . CATARACT EXTRACTION W/ INTRAOCULAR LENS  IMPLANT, BILATERAL Bilateral   . COLON SURGERY  1981   Meckles Diverticulum with volvulus  . CORONARY ANGIOGRAPHY N/A 01/18/2018   Procedure: CORONARY ANGIOGRAPHY;  Surgeon: Lorretta Harp, MD;  Location: [facility];  Service: Cardiovascular;  Laterality: N/A;  . EP IMPLANTABLE DEVICE N/A 03/21/2016   Procedure: Pacemaker Implant;  Surgeon: Evans Lance, MD;  Location: [facility];  Service: Cardiovascular;  Laterality: N/A;  . EYE SURGERY    . INGUINAL HERNIA REPAIR Right 2009  . INSERT / REPLACE / REMOVE PACEMAKER    . INSERTION PROSTATE RADIATION SEED    . TEE WITHOUT CARDIOVERSION N/A 06/10/2016   Procedure: TRANSESOPHAGEAL ECHOCARDIOGRAM (TEE);  Surgeon: Sherren Mocha, MD;  Location: [facility];  Service: Open Heart Surgery;  Laterality: N/A;  . TEE WITHOUT CARDIOVERSION N/A 11/10/2017   Procedure: TRANSESOPHAGEAL ECHOCARDIOGRAM (TEE);  Surgeon: Acie Fredrickson Wonda Cheng, MD;  Location: [facility] ENDOSCOPY;  Service: Cardiovascular;  Laterality: N/A;  . TEE WITHOUT CARDIOVERSION N/A 12/15/2017   Procedure: TRANSESOPHAGEAL ECHOCARDIOGRAM (TEE);  Surgeon: Buford Dresser, MD;  Location: [facility] ENDOSCOPY;  Service: Cardiovascular;  Laterality: N/A;  . TONSILLECTOMY  ~ 1947  . TRANSCATHETER AORTIC VALVE REPLACEMENT, TRANSFEMORAL N/A 06/10/2016   Procedure: TRANSCATHETER AORTIC VALVE REPLACEMENT, TRANSFEMORAL;  Surgeon: Sherren Mocha, MD;   Location: [facility];  Service: Open Heart Surgery;  Laterality: N/A;        Home Medications    Prior to Admission medications   Medication Sig Start Date End Date Taking? Authorizing Provider  albuterol (PROVENTIL) (2.5 MG/3ML) 0.083% nebulizer solution Take 3 mLs (2.5 mg total) by nebulization every 8 (eight) hours as needed for wheezing or shortness of breath. 10/13/17   Nafziger, Tommi Rumps, NP  amLODipine (NORVASC) 5 MG tablet Take 1 tablet (5 mg total) by mouth daily. 01/20/18   Kroeger, Lorelee Cover., PA-C  apixaban (ELIQUIS) 2.5 MG TABS tablet Take 1 tablet (2.5 mg total) by mouth 2 (two) times daily. 10/22/17   Evans Lance, MD  atorvastatin (LIPITOR) 40 MG tablet Take 1 tablet (40 mg total) by mouth daily at 6 PM. 01/19/18   Kroeger, Lorelee Cover., PA-C  budesonide-formoterol (SYMBICORT) 80-4.5 MCG/ACT inhaler Inhale 2 puffs into the lungs 2 (two) times daily. 10/20/17   Lauraine Rinne, NP  cholecalciferol (VITAMIN D) 1000 units tablet Take 1,000 Units by mouth 2 (two) times a week.     [provider]  isosorbide mononitrate (IMDUR) 60 MG 24 hr tablet Take 1 tablet (60 mg total) by mouth daily. 01/20/18   Kroeger, Lorelee Cover., PA-C  lansoprazole (  PREVACID) 15 MG capsule Take 1 capsule (15 mg total) by mouth daily at 12 noon. Patient taking differently: Take 15 mg by mouth daily as needed (heartburn.).  01/28/18   Dorothy Spark, MD  latanoprost (XALATAN) 0.005 % ophthalmic solution Place 1 drop into both eyes at bedtime.    [provider]  levothyroxine (SYNTHROID, LEVOTHROID) 25 MCG tablet Take 0.5 tablets (12.5 mcg total) by mouth daily before breakfast. 10/21/17   Dorothy Spark, MD  lidocaine (LIDODERM) 5 % Place 1 patch onto the skin daily. Remove & Discard patch within 12 hours or as directed by MD Patient not taking: Reported on 03/28/2018 12/30/17   Dorothyann Peng, NP  menthol-cetylpyridinium (CEPACOL) 3 MG lozenge Take 1 lozenge (3 mg total) by mouth as needed for sore  throat. 10/24/17   Lavina Hamman, MD  metoprolol succinate (TOPROL-XL) 100 MG 24 hr tablet Take 1 tablet (100 mg total) by mouth daily. Take with or immediately following a meal. 01/20/18   Kroeger, Lorelee Cover., PA-C  nitroGLYCERIN (NITROSTAT) 0.4 MG SL tablet Place 1 tablet (0.4 mg total) under the tongue every 5 (five) minutes x 3 doses as needed for chest pain. 01/19/18   Kroeger, Lorelee Cover., PA-C  nystatin cream (MYCOSTATIN) Apply 1 application topically 2 (two) times daily. Patient not taking: Reported on 03/28/2018 12/23/17   Dorothyann Peng, NP  polyethylene glycol (MIRALAX / GLYCOLAX) packet Take 17 g by mouth daily. Patient taking differently: Take 17 g by mouth daily as needed for moderate constipation.  11/14/17   Bonnell Public, MD  senna (SENOKOT) 8.6 MG TABS tablet Take 1 tablet (8.6 mg total) by mouth daily as needed for mild constipation. 11/14/17   Bonnell Public, MD  vitamin B-12 (CYANOCOBALAMIN) 1000 MCG tablet Take 1,000 mcg by mouth daily.    [provider]    Family History Family History  Problem Relation Age of Onset  . Heart disease Mother   . Heart disease Father     Social History Social History   Tobacco Use  . Smoking status: Never Smoker  . Smokeless tobacco: Never Used  Substance Use Topics  . Alcohol use: No    Alcohol/week: 0.0 standard drinks    Comment: 04/30/2016 "nothing in years"  . Drug use: No     Allergies   Ciprofloxacin; Hydrochlorothiazide; Sulfa antibiotics; Ace inhibitors; Losartan; Carvedilol; Lisinopril; and Soy allergy   Review of Systems Review of Systems  Constitutional: Positive for fever.  Respiratory: Negative for cough and shortness of breath.   Cardiovascular: Negative for chest pain.  Gastrointestinal: Negative for vomiting.  Neurological: Positive for dizziness, weakness and headaches.  All other systems reviewed and are negative.    Physical Exam Updated Vital Signs BP 101/74   Pulse 61   Temp 98  F (36.7 C) (Oral)   Resp 14   SpO2 96%   Physical Exam  Constitutional: He appears well-developed and well-nourished. No distress.  HENT:  Head: Normocephalic and atraumatic.  Right Ear: External ear normal.  Left Ear: External ear normal.  Nose: Nose normal.  Eyes: Right eye exhibits no discharge. Left eye exhibits no discharge.  Neck: Neck supple.  Cardiovascular: Normal rate, regular rhythm and normal heart sounds.  Pulmonary/Chest: Effort normal and breath sounds normal.  Abdominal: Soft. There is no tenderness.  Musculoskeletal: He exhibits no edema.  Neurological: He is alert.  Skin: Skin is warm and dry. He is not diaphoretic.  Psychiatric: His mood appears not  anxious.  Nursing note and vitals reviewed.    ED Treatments / Results  Labs (all labs ordered are listed, but only abnormal results are displayed) Labs Reviewed  CBC WITH DIFFERENTIAL/PLATELET - Abnormal; Notable for the following components:      Result Value   WBC 12.5 (*)    RBC 3.53 (*)    Hemoglobin 10.4 (*)    HCT 33.1 (*)    Platelets 123 (*)    Neutro Abs 10.2 (*)    Monocytes Absolute 1.2 (*)    All other components within normal limits  COMPREHENSIVE METABOLIC PANEL  I-STAT CG4 LACTIC ACID, ED    EKG EKG Interpretation  Date/Time:  Monday March 29 2018 11:42:55 EDT Ventricular Rate:  62 PR Interval:    QRS Duration: 139 QT Interval:  506 QTC Calculation: 514 R Axis:   108 Text Interpretation:  Sinus rhythm Probable left atrial enlargement Nonspecific intraventricular conduction delay Anterior infarct, old Minimal ST depression, inferior leads Confirmed by Sherwood Gambler [phone]) on 03/29/2018 11:54:51 AM   Radiology Dg Chest 2 View  Result Date: 03/28/2018 CLINICAL DATA:  Fever 2 days ago returning today. Shortness of breath on exertion. Generalized weakness. EXAM: CHEST - 2 VIEW COMPARISON:  01/15/2018 FINDINGS: Status post endovascular aortic valve replacement. Heart is  top-normal in size. No mediastinal or hilar masses. No evidence of adenopathy. Clear lungs.  No pleural effusion or pneumothorax. Stable left anterior chest wall sequential pacemaker. Skeletal structures are demineralized but intact. IMPRESSION: No acute cardiopulmonary disease. Electronically Signed   By: Lajean Manes M.D.   On: 03/28/2018 17:43    Procedures Procedures (including critical care time)  Medications Ordered in ED Medications  ampicillin (OMNIPEN) 2 g in sodium chloride 0.9 % 100 mL IVPB (has no administration in time range)     Initial Impression / Assessment and Plan / ED Course  I have reviewed the triage vital signs and the nursing notes.  Pertinent labs & imaging results that were available during my care of the patient were reviewed by me and considered in my medical decision making (see chart for details).     Patient will be started on IV ampicillin for enterococcus bacteremia.  Concern for endocarditis.  Given he already has positive blood cultures, no indication for further blood cultures.  Discussed with pharmacy, recommend 2 g IV ampicillin every 12 hours based on renal function from yesterday.  Discussed with Dr. Lorin Mercy who will admit.  Final Clinical Impressions(s) / ED Diagnoses   Final diagnoses:  Bacteremia due to Enterococcus    ED Discharge Orders    None       Sherwood Gambler, MD 03/29/18 1155

## 2018-03-29 NOTE — Discharge Instructions (Addendum)
Follow with Primary MD Dorothyann Peng, NP in 7 days   Get CBC, CMP checked  by Primary MD  in 5-7 days   Activity: As tolerated with Full fall precautions use walker/cane & assistance as needed  Disposition Home    Diet: Heart Healthy     Special Instructions: If you have smoked or chewed Tobacco  in the last 2 yrs please stop smoking, stop any regular Alcohol  and or any Recreational drug use.  On your next visit with your primary care physician please Get Medicines reviewed and adjusted.  Please request your Prim.MD to go over all Hospital Tests and Procedure/Radiological results at the follow up, please get all Hospital records sent to your Prim MD by signing hospital release before you go home.  If you experience worsening of your admission symptoms, develop shortness of breath, life threatening emergency, suicidal or homicidal thoughts you must seek medical attention immediately by calling 911 or calling your MD immediately  if symptoms less severe.      Information on my medicine - ELIQUIS (apixaban)  This medication education was reviewed with me or my healthcare representative as part of my discharge preparation.  The pharmacist that spoke with me during my hospital stay was:  Willia Craze, Student-PharmD  Why was Eliquis prescribed for you? Eliquis was prescribed for you to reduce the risk of a blood clot forming that can cause a stroke if you have a medical condition called atrial fibrillation (a type of irregular heartbeat).  What do You need to know about Eliquis ? Take your Eliquis TWICE DAILY - one tablet in the morning and one tablet in the evening with or without food. If you have difficulty swallowing the tablet whole please discuss with your pharmacist how to take the medication safely.  Take Eliquis exactly as prescribed by your doctor and DO NOT stop taking Eliquis without talking to the doctor who prescribed the medication.  Stopping may increase your  risk of developing a stroke.  Refill your prescription before you run out.  After discharge, you should have regular check-up appointments with your healthcare provider that is prescribing your Eliquis.  In the future your dose may need to be changed if your kidney function or weight changes by a significant amount or as you get older.  What do you do if you miss a dose? If you miss a dose, take it as soon as you remember on the same day and resume taking twice daily.  Do not take more than one dose of ELIQUIS at the same time to make up a missed dose.  Important Safety Information A possible side effect of Eliquis is bleeding. You should call your healthcare provider right away if you experience any of the following: ? Bleeding from an injury or your nose that does not stop. ? Unusual colored urine (red or dark brown) or unusual colored stools (red or black). ? Unusual bruising for unknown reasons. ? A serious fall or if you hit your head (even if there is no bleeding).  Some medicines may interact with Eliquis and might increase your risk of bleeding or clotting while on Eliquis. To help avoid this, consult your healthcare provider or pharmacist prior to using any new prescription or non-prescription medications, including herbals, vitamins, non-steroidal anti-inflammatory drugs (NSAIDs) and supplements.  This website has more information on Eliquis (apixaban): http://www.eliquis.com/eliquis/home

## 2018-03-29 NOTE — ED Notes (Signed)
[place], MD at bedside.  

## 2018-03-29 NOTE — H&P (Signed)
History and Physical    Ryan [name] HAL:[id number] DOB: December 19, 1929 DOA: 03/29/2018  PCP: Dorothyann Peng, NP Consultants:  Meda Coffee - cardiology; [place] - ID; Lovena Le - EP; Bartle - CVTS Patient coming from:  Home - lives with wife; NOK: Wife, [phone]; Daughter, [phone]  Chief Complaint: Enterococcus bacteremia  HPI: Ryan [name] is a 84 y.o. male with medical history significant of CAD; thrombocytopenia; severe AS s/p AVR; SBE of the prosthetic valve in 6/19; HLD; prostate CA; bladder CA; afib/SVT; OSA not on CPAP;  HTN; PVD; chronic combined CHF; and h/o amiodarone pulmonary toxicity in 5/19 presenting with fever and + blood cultures (seen at [facility] yesterday, cultures + for enterococcus).  He had blood work done and it "was contaminated" and they told him to come to the ER right away.  On Friday, he had a temp to 103 and he was very dizzy, weak, shaky.  He took Tylenol and Saturday the fever resolved.  But Sunday the fever returned, 102.  They took him to the [facility] ER and he had blood cultures and an evaluation.  He still isn't feeling right.  He as "doing spectacularly" until last Wednesday - fatigue, a little SOB with morning walk, and dizziness.  He has a chronic cough from PND, unchanged.  No chest pain - which he had in June, although at that time it mostly presented as diskitis with severe back pain.  No urinary symptoms.  No unusual GI symptoms.   ED Course:  Enterococcus bacteremia, concerning for endocarditis.  S/p TAVR.  Fevers since Friday, went to [facility] yesterday and cultures are positive.  Pharmacy recommends Ampicillin 2 grams q12h.    Review of Systems: As per HPI; otherwise review of systems reviewed and negative.   Ambulatory Status:  Ambulates with a cane or walker  Past Medical History:  Diagnosis Date  . Age-related macular degeneration   . Allergic rhinitis   . 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 Arthritis    "hands, lower back" (04/30/2016)  . Asthma    . Carotid artery obstruction    a. s/p bilat CEA.  . Chronic combined systolic (congestive) and diastolic (congestive) heart failure ([place]) 03/18/2016  . Chronic lower back pain   . Claudication in peripheral vascular disease ([place]) 01/01/2018   Claudication  . Coronary arteriosclerosis in native artery    a. 2017 Cardiac catheterization demonstrated worsening CAD with 70% mid LCx, 80% OM1 and 80% mid RCA stenosis. b.  In 11/17, he underwent successful rotational atherectomy and DES to RCA.  Marland Kitchen Decreased diffusion capacity 10/23/2017  . Diverticulitis of colon   . DJD (degenerative joint disease)   . Elevated TSH 06/30/2017  . GERD (gastroesophageal reflux disease)   . History of hiatal hernia   . HLD (hyperlipidemia)   . Hypertension   . Hypertensive heart disease with heart failure ([place])   . Intractable back pain 11/07/2017  . OSA (obstructive sleep apnea)    intolerant to CPAP  . PAF (paroxysmal atrial fibrillation) ([place])   . Paroxysmal supraventricular tachycardia ([place])   . Presence of permanent cardiac pacemaker   . Primary malignant neoplasm of bladder ([place])   . Prostate cancer ([place])   . 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 S/P AVR 12/02/2017  . Syncope 10/23/2017  . Tachy-brady syndrome ([place])   . Thrombocytopenia ([place]) 11/07/2017  . Urinary retention 11/07/2017    Past Surgical History:  Procedure Laterality Date  . APPENDECTOMY    .  CARDIAC CATHETERIZATION N/A 01/17/2016   Procedure: Right/Left Heart Cath and Coronary Angiography;  Surgeon: Belva Crome, MD;  Location: [facility];  Service: Cardiovascular;  Laterality: N/A;  . CARDIAC CATHETERIZATION N/A 04/30/2016   Procedure: Coronary/Graft Atherectomy;  Surgeon: Sherren Mocha, MD;  Location: [place] [facility];  Service: Cardiovascular;  Laterality: N/A;  . CAROTID ENDARTERECTOMY Bilateral   . CATARACT EXTRACTION W/ INTRAOCULAR LENS  IMPLANT, BILATERAL Bilateral   . COLON  SURGERY  1981   Meckles Diverticulum with volvulus  . CORONARY ANGIOGRAPHY N/A 01/18/2018   Procedure: CORONARY ANGIOGRAPHY;  Surgeon: Lorretta Harp, MD;  Location: [facility];  Service: Cardiovascular;  Laterality: N/A;  . EP IMPLANTABLE DEVICE N/A 03/21/2016   Procedure: Pacemaker Implant;  Surgeon: Evans Lance, MD;  Location: [place] [facility];  Service: Cardiovascular;  Laterality: N/A;  . EYE SURGERY    . INGUINAL HERNIA REPAIR Right 2009  . INSERT / REPLACE / REMOVE PACEMAKER    . INSERTION PROSTATE RADIATION SEED    . TEE WITHOUT CARDIOVERSION N/A 06/10/2016   Procedure: TRANSESOPHAGEAL ECHOCARDIOGRAM (TEE);  Surgeon: Sherren Mocha, MD;  Location: [place];  Service: Open Heart Surgery;  Laterality: N/A;  . TEE WITHOUT CARDIOVERSION N/A 11/10/2017   Procedure: TRANSESOPHAGEAL ECHOCARDIOGRAM (TEE);  Surgeon: Acie Fredrickson Wonda Cheng, MD;  Location: [facility] ENDOSCOPY;  Service: Cardiovascular;  Laterality: N/A;  . TEE WITHOUT CARDIOVERSION N/A 12/15/2017   Procedure: TRANSESOPHAGEAL ECHOCARDIOGRAM (TEE);  Surgeon: Buford Dresser, MD;  Location: [facility] ENDOSCOPY;  Service: Cardiovascular;  Laterality: N/A;  . TONSILLECTOMY  ~ 1947  . TRANSCATHETER AORTIC VALVE REPLACEMENT, TRANSFEMORAL N/A 06/10/2016   Procedure: TRANSCATHETER AORTIC VALVE REPLACEMENT, TRANSFEMORAL;  Surgeon: Sherren Mocha, MD;  Location: [place];  Service: Open Heart Surgery;  Laterality: N/A;    Social History   Socioeconomic History  . Marital status: Married    Spouse name: Not on file  . Number of children: Not on file  . Years of education: Not on file  . Highest education level: Not on file  Occupational History  . Occupation: retired  Scientific laboratory technician  . Financial resource strain: Not on file  . Food insecurity:    Worry: Not on file    Inability: Not on file  . Transportation needs:    Medical: Not on file    Non-medical: Not on file  Tobacco Use  . Smoking status: Never Smoker  . Smokeless tobacco:  Never Used  Substance and Sexual Activity  . Alcohol use: No    Alcohol/week: 0.0 standard drinks    Comment: 04/30/2016 "nothing in years"  . Drug use: No  . Sexual activity: Never  Lifestyle  . Physical activity:    Days per week: Not on file    Minutes per session: Not on file  . Stress: Not on file  Relationships  . Social connections:    Talks on phone: Not on file    Gets together: Not on file    Attends religious service: Not on file    Active member of club or organization: Not on file    Attends meetings of clubs or organizations: Not on file    Relationship status: Not on file  . Intimate partner violence:    Fear of current or ex partner: Not on file    Emotionally abused: Not on file    Physically abused: Not on file    Forced sexual activity: Not on file  Other Topics Concern  .  Not on file  Social History Narrative   Retired    Three children - One in [place], One in [place], and one in [place].        Allergies  Allergen Reactions  . Ciprofloxacin Anaphylaxis  . Hydrochlorothiazide Anaphylaxis  . Sulfa Antibiotics Anaphylaxis  . Ace Inhibitors Cough  . Losartan Cough  . Carvedilol Cough    Pt states it "causes him too cough."  . Lisinopril Cough    Pt reports worsening cough and "feeling wheezy."  . Soy Allergy Nausea And Vomiting    Family History  Problem Relation Age of Onset  . Heart disease Mother   . Heart disease Father     Prior to Admission medications   Medication Sig Start Date End Date Taking? Authorizing Provider  albuterol (PROVENTIL) (2.5 MG/3ML) 0.083% nebulizer solution Take 3 mLs (2.5 mg total) by nebulization every 8 (eight) hours as needed for wheezing or shortness of breath. 10/13/17   Nafziger, Tommi Rumps, NP  amLODipine (NORVASC) 5 MG tablet Take 1 tablet (5 mg total) by mouth daily. 01/20/18   Kroeger, Lorelee Cover., PA-C  apixaban (ELIQUIS) 2.5 MG TABS tablet Take 1 tablet (2.5 mg total) by mouth 2 (two) times daily. 10/22/17   Evans Lance, MD  atorvastatin (LIPITOR) 40 MG tablet Take 1 tablet (40 mg total) by mouth daily at 6 PM. 01/19/18   Kroeger, Lorelee Cover., PA-C  budesonide-formoterol (SYMBICORT) 80-4.5 MCG/ACT inhaler Inhale 2 puffs into the lungs 2 (two) times daily. 10/20/17   Lauraine Rinne, NP  cholecalciferol (VITAMIN D) 1000 units tablet Take 1,000 Units by mouth 2 (two) times a week.     [provider]  isosorbide mononitrate (IMDUR) 60 MG 24 hr tablet Take 1 tablet (60 mg total) by mouth daily. 01/20/18   Kroeger, Lorelee Cover., PA-C  lansoprazole (PREVACID) 15 MG capsule Take 1 capsule (15 mg total) by mouth daily at 12 noon. Patient taking differently: Take 15 mg by mouth daily as needed (heartburn.).  01/28/18   Dorothy Spark, MD  latanoprost (XALATAN) 0.005 % ophthalmic solution Place 1 drop into both eyes at bedtime.    [provider]  levothyroxine (SYNTHROID, LEVOTHROID) 25 MCG tablet Take 0.5 tablets (12.5 mcg total) by mouth daily before breakfast. 10/21/17   Dorothy Spark, MD  lidocaine (LIDODERM) 5 % Place 1 patch onto the skin daily. Remove & Discard patch within 12 hours or as directed by MD Patient not taking: Reported on 03/28/2018 12/30/17   Dorothyann Peng, NP  menthol-cetylpyridinium (CEPACOL) 3 MG lozenge Take 1 lozenge (3 mg total) by mouth as needed for sore throat. 10/24/17   Lavina Hamman, MD  metoprolol succinate (TOPROL-XL) 100 MG 24 hr tablet Take 1 tablet (100 mg total) by mouth daily. Take with or immediately following a meal. 01/20/18   Kroeger, Lorelee Cover., PA-C  nitroGLYCERIN (NITROSTAT) 0.4 MG SL tablet Place 1 tablet (0.4 mg total) under the tongue every 5 (five) minutes x 3 doses as needed for chest pain. 01/19/18   Kroeger, Lorelee Cover., PA-C  nystatin cream (MYCOSTATIN) Apply 1 application topically 2 (two) times daily. Patient not taking: Reported on 03/28/2018 12/23/17   Dorothyann Peng, NP  polyethylene glycol (MIRALAX / GLYCOLAX) packet Take 17 g by mouth  daily. Patient taking differently: Take 17 g by mouth daily as needed for moderate constipation.  11/14/17   Bonnell Public, MD  senna (SENOKOT) 8.6 MG TABS tablet Take 1 tablet (8.6 mg  total) by mouth daily as needed for mild constipation. 11/14/17   Bonnell Public, MD  vitamin B-12 (CYANOCOBALAMIN) 1000 MCG tablet Take 1,000 mcg by mouth daily.    [provider]    Physical Exam: Vitals:   03/29/18 1215 03/29/18 1345 03/29/18 1430 03/29/18 1515  BP: (!) 109/52 106/60 (!) 102/58   Pulse: 63 (!) 58 63 62  Resp: 13 19 16 17   Temp:      TempSrc:      SpO2: 98% 99% 97% 95%     General:  Appears calm and comfortable and is NAD Eyes:  PERRL, EOMI, normal lids, iris ENT:  grossly normal hearing, lips & tongue, mmm; appropriate dentition Neck:  no LAD, masses or thyromegaly; no carotid bruits Cardiovascular:  RRR, no m/r/g. No LE edema.  Respiratory:   CTA bilaterally with no wheezes/rales/rhonchi.  Normal respiratory effort. Abdomen:  soft, NT, ND, NABS Back:   normal alignment, no CVAT Skin:  no rash or induration seen on limited exam; no osler nodes or Janeway lesions appreciated Musculoskeletal:  grossly normal tone BUE/BLE, good ROM, no bony abnormality Lower extremity:  No LE edema.  Limited foot exam with no ulcerations.  2+ distal pulses. Psychiatric:  grossly normal mood and affect, speech fluent and appropriate, AOx3 Neurologic:  CN 2-12 grossly intact, moves all extremities in coordinated fashion, sensation intact    Radiological Exams on Admission: Dg Chest 2 View  Result Date: 03/28/2018 CLINICAL DATA:  Fever 2 days ago returning today. Shortness of breath on exertion. Generalized weakness. EXAM: CHEST - 2 VIEW COMPARISON:  01/15/2018 FINDINGS: Status post endovascular aortic valve replacement. Heart is top-normal in size. No mediastinal or hilar masses. No evidence of adenopathy. Clear lungs.  No pleural effusion or pneumothorax. Stable left anterior  chest wall sequential pacemaker. Skeletal structures are demineralized but intact. IMPRESSION: No acute cardiopulmonary disease. Electronically Signed   By: Lajean Manes M.D.   On: 03/28/2018 17:43    EKG: Independently reviewed.  NSR with rate 62; IVCD; nonspecific ST changes with no evidence of acute ischemia   Labs on Admission: I have personally reviewed the available labs and imaging studies at the time of the admission.  Pertinent labs:   WBC 12.5 Hgb 10.4 Platelets 123 Lactate 1.26 Normal UA Glucose 119 BUN 34/Creatinine 2.03/GFR 28 - baseline appears to be about 1.4 CRP 5.1  Assessment/Plan Principal Problem:   Bacteremia due to Enterococcus Active Problems:   PAF (paroxysmal atrial fibrillation) (HCC)   Chronic combined systolic (congestive) and diastolic (congestive) heart failure (HCC)   Hypertension   Prosthetic valve endocarditis (HCC)   S/P AVR   Acute renal insufficiency   Presence of permanent cardiac pacemaker   Enterococcus bacteremia -Patient previously hospitalized in June with Enterococcus endocarditis causing bacteremia -He has a h/o AVR and this prosthetic valve was the source of the infection -He was treated for 6 weeks with Ampicillin and Rocephin, as per ID -He developed recurrent fever and weakness over the weekend and was seen at [facility] yesterday -Blood cultures overnight have again grown Enterococcus -Suspect recurrent endocarditis vs. Pacemaker lead infection -ID has been consulted -He has been resumed for now on Ampicillin and Rocephin, pending sensitivities on current culture -Repeat blood cultures tomorrow -Will need TEE; cardiology has been notified via [employer] -He is likely to again need long-term antibiotics -He prefers to receive treatment via PICC line at home, if possible  AKI on stage 3 CKD -July renal function showed GFR in  the 40-50 range -In August, this was markedly worse, in the 20s; this does not appear to have been  followed back to his baseline -Current renal function with GFR 24, which is worse than 28 on 10/27 -While this may be his new baseline, instead it appears that he has mild acute insufficiency on his CKD -Will rehydrate and follow  Afib on [facility] -He is rate controlled on Toprol, will control -Continue Eliquis for now  Chronic combined CHF -Echo in 5/19 showed EF 35-40% and grade 1 diastolic dysfunction -Echo on 6/11 showed the vegetation; repeat Echo on 7/16 showed EF 50-55% and resolution of the vegetation -He appears to be compensated at this time  HTN -Continue Norvasc, Toprol  DVT prophylaxis: Eliquis Code Status:  Full - confirmed with patient/family Family Communication: Daughter present throughout evaluation  Disposition Plan:  Home once clinically improved Consults called: Cardiology; ID  Admission status: Admit - It is my clinical opinion that admission to [facility] is reasonable and necessary because of the expectation that this patient will require hospital care that crosses at least 2 midnights to treat this condition based on the medical complexity of the problems presented.  Given the aforementioned information, the predictability of an adverse outcome is felt to be significant.    Karmen Bongo MD [facility]  If note is complete, please contact covering daytime or nighttime physician. [email or web address] Password [facility]  03/29/2018, 3:47 PM

## 2018-03-29 NOTE — ED Triage Notes (Signed)
Pt in with family reporting they received a phone call that he had positive blood cultures from yesterday, prior to this had long episode of endocarditis related to the same bacteria and received IV antibiotics for two months

## 2018-03-30 ENCOUNTER — Inpatient Hospital Stay (HOSPITAL_COMMUNITY): Payer: Medicare Other

## 2018-03-30 ENCOUNTER — Encounter (HOSPITAL_COMMUNITY): Payer: Self-pay | Admitting: *Deleted

## 2018-03-30 ENCOUNTER — Ambulatory Visit (HOSPITAL_COMMUNITY): Admit: 2018-03-30 | Payer: Medicare Other | Admitting: Cardiology

## 2018-03-30 ENCOUNTER — Encounter (HOSPITAL_COMMUNITY)
Admission: EM | Disposition: A | Discharge status: Home Health | Payer: Self-pay | Source: Home / Self Care | Attending: Internal Medicine

## 2018-03-30 DIAGNOSIS — I5042 Chronic combined systolic (congestive) and diastolic (congestive) heart failure: Secondary | ICD-10-CM

## 2018-03-30 DIAGNOSIS — I351 Nonrheumatic aortic (valve) insufficiency: Secondary | ICD-10-CM

## 2018-03-30 DIAGNOSIS — R7881 Bacteremia: Secondary | ICD-10-CM

## 2018-03-30 DIAGNOSIS — D696 Thrombocytopenia, unspecified: Secondary | ICD-10-CM

## 2018-03-30 DIAGNOSIS — D72829 Elevated white blood cell count, unspecified: Secondary | ICD-10-CM

## 2018-03-30 HISTORY — PX: TEE WITHOUT CARDIOVERSION: SHX5443

## 2018-03-30 LAB — CBC
HCT: 34.9 % — ABNORMAL LOW (ref 39.0–52.0)
Hemoglobin: 10.9 g/dL — ABNORMAL LOW (ref 13.0–17.0)
MCH: 29.1 pg (ref 26.0–34.0)
MCHC: 31.2 g/dL (ref 30.0–36.0)
MCV: 93.1 fL (ref 80.0–100.0)
PLATELETS: 126 10*3/uL — AB (ref 150–400)
RBC: 3.75 MIL/uL — AB (ref 4.22–5.81)
RDW: 13.2 % (ref 11.5–15.5)
WBC: 7.6 10*3/uL (ref 4.0–10.5)
nRBC: 0 % (ref 0.0–0.2)

## 2018-03-30 LAB — BASIC METABOLIC PANEL
ANION GAP: 9 (ref 5–15)
BUN: 29 mg/dL — ABNORMAL HIGH (ref 8–23)
CALCIUM: 8.9 mg/dL (ref 8.9–10.3)
CO2: 24 mmol/L (ref 22–32)
Chloride: 108 mmol/L (ref 98–111)
Creatinine, Ser: 2.18 mg/dL — ABNORMAL HIGH (ref 0.61–1.24)
GFR calc Af Amer: 30 mL/min — ABNORMAL LOW (ref >60)
GFR, EST NON AFRICAN AMERICAN: 26 mL/min — AB (ref >60)
GLUCOSE: 107 mg/dL — AB (ref 70–99)
Potassium: 3.8 mmol/L (ref 3.5–5.1)
Sodium: 141 mmol/L (ref 135–145)

## 2018-03-30 LAB — BLOOD CULTURE ID PANEL (REFLEXED)
Acinetobacter baumannii: NOT DETECTED
CANDIDA GLABRATA: NOT DETECTED
Candida albicans: NOT DETECTED
Candida krusei: NOT DETECTED
Candida parapsilosis: NOT DETECTED
Candida tropicalis: NOT DETECTED
ENTEROBACTER CLOACAE COMPLEX: NOT DETECTED
ENTEROCOCCUS SPECIES: DETECTED — AB
Enterobacteriaceae species: NOT DETECTED
Escherichia coli: NOT DETECTED
HAEMOPHILUS INFLUENZAE: NOT DETECTED
Klebsiella oxytoca: NOT DETECTED
Klebsiella pneumoniae: NOT DETECTED
LISTERIA MONOCYTOGENES: NOT DETECTED
NEISSERIA MENINGITIDIS: NOT DETECTED
PROTEUS SPECIES: NOT DETECTED
Pseudomonas aeruginosa: NOT DETECTED
SERRATIA MARCESCENS: NOT DETECTED
STAPHYLOCOCCUS AUREUS BCID: NOT DETECTED
STREPTOCOCCUS AGALACTIAE: NOT DETECTED
STREPTOCOCCUS SPECIES: NOT DETECTED
Staphylococcus species: NOT DETECTED
Streptococcus pneumoniae: NOT DETECTED
Streptococcus pyogenes: NOT DETECTED
VANCOMYCIN RESISTANCE: NOT DETECTED

## 2018-03-30 LAB — OSMOLALITY: Osmolality: 304 mOsm/kg — ABNORMAL HIGH (ref 275–295)

## 2018-03-30 LAB — OSMOLALITY, URINE: Osmolality, Ur: 472 mOsm/kg (ref 300–900)

## 2018-03-30 LAB — SODIUM, URINE, RANDOM: Sodium, Ur: 56 mmol/L

## 2018-03-30 LAB — URIC ACID: Uric Acid, Serum: 4.7 mg/dL (ref 3.7–8.6)

## 2018-03-30 SURGERY — ECHOCARDIOGRAM, TRANSESOPHAGEAL
Anesthesia: Moderate Sedation

## 2018-03-30 MED ORDER — FENTANYL CITRATE (PF) 100 MCG/2ML IJ SOLN
INTRAMUSCULAR | Status: AC
  Filled 2018-03-30: qty 2

## 2018-03-30 MED ORDER — BUTAMBEN-TETRACAINE-BENZOCAINE 2-2-14 % EX AERO
INHALATION_SPRAY | CUTANEOUS | Status: DC | PRN
  Administered 2018-03-30: 2 via TOPICAL

## 2018-03-30 MED ORDER — SODIUM CHLORIDE 0.9 % IV SOLN: INTRAVENOUS | Status: DC

## 2018-03-30 MED ORDER — MIDAZOLAM HCL 10 MG/2ML IJ SOLN
INTRAMUSCULAR | Status: DC | PRN
  Administered 2018-03-30: 1.5 mg via INTRAVENOUS
  Administered 2018-03-30: 1 mg via INTRAVENOUS
  Administered 2018-03-30: .5 mg via INTRAVENOUS
  Administered 2018-03-30: 2 mg via INTRAVENOUS

## 2018-03-30 MED ORDER — MIDAZOLAM HCL 5 MG/ML IJ SOLN
INTRAMUSCULAR | Status: AC
  Filled 2018-03-30: qty 2

## 2018-03-30 MED ORDER — SODIUM CHLORIDE 0.9 % IV SOLN
INTRAVENOUS | Status: DC | PRN
  Administered 2018-03-30 – 2018-03-31 (×2): 100 mL via INTRAVENOUS

## 2018-03-30 MED ORDER — FENTANYL CITRATE (PF) 100 MCG/2ML IJ SOLN
INTRAMUSCULAR | Status: DC | PRN
  Administered 2018-03-30: 25 ug via INTRAVENOUS

## 2018-03-30 MED ORDER — SODIUM CHLORIDE 0.9 % IV SOLN
INTRAVENOUS | Status: AC | PRN
  Administered 2018-03-30: 500 mL via INTRAVENOUS

## 2018-03-30 MED ORDER — MOMETASONE FURO-FORMOTEROL FUM 100-5 MCG/ACT IN AERO
2.0000 | INHALATION_SPRAY | Freq: Every day | RESPIRATORY_TRACT | Status: DC
  Administered 2018-03-31 – 2018-04-02 (×3): 2 via RESPIRATORY_TRACT
  Filled 2018-03-30: qty 8.8

## 2018-03-30 MED ORDER — SODIUM CHLORIDE 0.9 % IV SOLN
INTRAVENOUS | Status: DC | PRN
  Administered 2018-03-30 – 2018-04-01 (×3): 100 mL via INTRAVENOUS

## 2018-03-30 NOTE — Progress Notes (Signed)
  Echocardiogram Echocardiogram Transesophageal has been performed.  Ryan [name] 03/30/2018, 4:36 PM

## 2018-03-30 NOTE — Progress Notes (Signed)
[facility] for Infectious Disease   Reason for visit: Follow up on bacteremia  Interval History: 1/2 blood cultures positive from yesterday, WBC wnl, afebrile;  No rash.  TEE today   Physical Exam: Constitutional:  Vitals:   03/30/18 0540 03/30/18 0747  BP: (!) 156/59   Pulse: 63 67  Resp: 16 18  Temp: 97.7 F (36.5 C)   SpO2: 97% 96%   patient appears in NAD Eyes: anicteric Respiratory: Normal respiratory effort; CTA B Cardiovascular: RRR GI: soft, nt, nd  Review of Systems: Constitutional: negative for fevers and chills Gastrointestinal: negative for diarrhea  Lab Results  Component Value Date   WBC 7.6 03/30/2018   HGB 10.9 (L) 03/30/2018   HCT 34.9 (L) 03/30/2018   MCV 93.1 03/30/2018   PLT 126 (L) 03/30/2018    Lab Results  Component Value Date   CREATININE 2.18 (H) 03/30/2018   BUN 29 (H) 03/30/2018   NA 141 03/30/2018   K 3.8 03/30/2018   CL 108 03/30/2018   CO2 24 03/30/2018    Lab Results  Component Value Date   ALT 13 03/29/2018   AST 21 03/29/2018   ALKPHOS 73 03/29/2018     Microbiology: Recent Results (from the past 240 hour(s))  Blood culture (routine x 2)     Status: None (Preliminary result)   Collection Time: 03/28/18  4:56 PM  Result Value Ref Range Status   Specimen Description   Final    BLOOD RIGHT ARM Performed at [facility], [place] [address], [place], [place] [address]    Special Requests   Final    BAA BCHV Performed at [facility], [place] [address] [place], [place] [address]    Culture  Setup Time   Final    GRAM POSITIVE COCCI IN BOTH AEROBIC AND ANAEROBIC BOTTLES CRITICAL VALUE NOTED.  VALUE IS CONSISTENT WITH PREVIOUSLY REPORTED AND CALLED VALUE. Performed at [facility], [place] [address], [place], [place] [address]    Culture GRAM POSITIVE COCCI  Final   Report Status PENDING  Incomplete  Blood culture (routine x 2)     Status: None (Preliminary result)   Collection Time: 03/28/18  5:20 PM  Result Value Ref Range Status   Specimen Description   Final    BLOOD LEFT ARM Performed at [place] [address] [place], [place] [address]    Special Requests   Final    BAA BCHV Performed at [facility], [place] [address] [place], [place] [address]    Culture  Setup Time   Final    GRAM POSITIVE COCCI IN BOTH AEROBIC AND ANAEROBIC BOTTLES CRITICAL RESULT CALLED TO, READ BACK BY AND VERIFIED WITH: RN A HARTLEY [id number] MLM Performed at [facility], [address], [place], [place] [address]    Culture GRAM POSITIVE COCCI  Final   Report Status PENDING  Incomplete  Blood Culture ID Panel (Reflexed)     Status: Abnormal   Collection Time: 03/28/18  5:20 PM  Result Value Ref Range Status   Enterococcus species DETECTED (A) NOT DETECTED Final    Comment: CRITICAL RESULT CALLED TO, READ BACK BY AND VERIFIED WITH: RN A HARTLEY [phone] MLM    Vancomycin resistance NOT DETECTED NOT DETECTED Final   Listeria monocytogenes NOT DETECTED NOT DETECTED Final   Staphylococcus species NOT DETECTED NOT DETECTED Final   Staphylococcus aureus (BCID) NOT DETECTED NOT DETECTED  Final   Streptococcus species NOT DETECTED NOT DETECTED Final   Streptococcus agalactiae NOT DETECTED NOT DETECTED Final   Streptococcus pneumoniae NOT DETECTED NOT DETECTED Final   Streptococcus pyogenes NOT DETECTED NOT DETECTED Final   Acinetobacter baumannii NOT DETECTED NOT DETECTED Final   Enterobacteriaceae species NOT DETECTED NOT DETECTED Final   Enterobacter cloacae complex NOT DETECTED NOT DETECTED Final   Escherichia coli NOT DETECTED NOT DETECTED Final   Klebsiella oxytoca NOT DETECTED NOT DETECTED Final   Klebsiella pneumoniae NOT DETECTED NOT DETECTED Final   Proteus species NOT DETECTED NOT DETECTED Final   Serratia marcescens NOT DETECTED NOT DETECTED Final   Haemophilus influenzae NOT DETECTED NOT DETECTED  Final   Neisseria meningitidis NOT DETECTED NOT DETECTED Final   Pseudomonas aeruginosa NOT DETECTED NOT DETECTED Final   Candida albicans NOT DETECTED NOT DETECTED Final   Candida glabrata NOT DETECTED NOT DETECTED Final   Candida krusei NOT DETECTED NOT DETECTED Final   Candida parapsilosis NOT DETECTED NOT DETECTED Final   Candida tropicalis NOT DETECTED NOT DETECTED Final    Comment: Performed at [facility], [place] [address], [place], [place] [address]  Culture, blood (routine x 2)     Status: None (Preliminary result)   Collection Time: 03/29/18  1:49 PM  Result Value Ref Range Status   Specimen Description BLOOD LEFT ANTECUBITAL  Final   Special Requests   Final    BOTTLES DRAWN AEROBIC AND ANAEROBIC Blood Culture adequate volume   Culture  Setup Time   Final    GRAM POSITIVE COCCI IN BOTH AEROBIC AND ANAEROBIC BOTTLES CRITICAL RESULT CALLED TO, READ BACK BY AND VERIFIED WITH: [place] [id number] FCP Performed at [facility], [place] [address], [place], [place] [address]    Culture GRAM POSITIVE COCCI  Final   Report Status PENDING  Incomplete  Blood Culture ID Panel (Reflexed)     Status: Abnormal   Collection Time: 03/29/18  1:49 PM  Result Value Ref Range Status   Enterococcus species DETECTED (A) NOT DETECTED Final    Comment: CRITICAL RESULT CALLED TO, READ BACK BY AND VERIFIED WITH: PHARMD JAMES L. [phone]    Vancomycin resistance NOT DETECTED NOT DETECTED Final   Listeria monocytogenes NOT DETECTED NOT DETECTED Final   Staphylococcus species NOT DETECTED NOT DETECTED Final   Staphylococcus aureus (BCID) NOT DETECTED NOT DETECTED Final   Streptococcus species NOT DETECTED NOT DETECTED Final   Streptococcus agalactiae NOT DETECTED NOT DETECTED Final   Streptococcus pneumoniae NOT DETECTED NOT DETECTED Final   Streptococcus pyogenes NOT DETECTED NOT DETECTED Final   Acinetobacter baumannii NOT DETECTED NOT DETECTED Final   Enterobacteriaceae  species NOT DETECTED NOT DETECTED Final   Enterobacter cloacae complex NOT DETECTED NOT DETECTED Final   Escherichia coli NOT DETECTED NOT DETECTED Final   Klebsiella oxytoca NOT DETECTED NOT DETECTED Final   Klebsiella pneumoniae NOT DETECTED NOT DETECTED Final   Proteus species NOT DETECTED NOT DETECTED Final   Serratia marcescens NOT DETECTED NOT DETECTED Final   Haemophilus influenzae NOT DETECTED NOT DETECTED Final   Neisseria meningitidis NOT DETECTED NOT DETECTED Final   Pseudomonas aeruginosa NOT DETECTED NOT DETECTED Final   Candida albicans NOT DETECTED NOT DETECTED Final   Candida glabrata NOT DETECTED NOT DETECTED Final   Candida krusei NOT DETECTED NOT DETECTED Final   Candida parapsilosis NOT DETECTED NOT DETECTED Final   Candida tropicalis NOT DETECTED NOT DETECTED Final    Comment:  Performed at [facility], [place] [address], [place], [place] [address]  Culture, blood (routine x 2)     Status: None (Preliminary result)   Collection Time: 03/29/18  1:54 PM  Result Value Ref Range Status   Specimen Description BLOOD LEFT ANTECUBITAL  Final   Special Requests   Final    BOTTLES DRAWN AEROBIC AND ANAEROBIC Blood Culture adequate volume   Culture   Final    NO GROWTH < 24 HOURS Performed at [facility], [place] [address] [place], [place] [address]    Report Status PENDING  Incomplete    Impression/Plan:  1. Bacteremia - again positive blood culture with Enterococcus.  1 on 10/27 and one from yesterday.  High concern for vegetation.   Repeat blood cultures today Continue ceftriaxone and ampicillin.    2.  Leukocytosis - improved and now wnl.   3. Thrombocytopenia - stable.  Will continue to monitor with current medications.

## 2018-03-30 NOTE — Progress Notes (Signed)
[facility] Coordinator will follow pt with ID team to support [facility] [place] [facility] [place] as ordered.  If patient discharges after hours, please call [phone].   Larry Sierras 03/30/2018, 9:22 AM

## 2018-03-30 NOTE — Progress Notes (Addendum)
PROGRESS NOTE                                                                                                                                                                                                             Patient Demographics:    Ryan [name], is a 84 y.o. male, DOB - 1930-03-26, ZOX:[id number]  Admit date - 03/29/2018   Admitting Physician Karmen Bongo, MD  Outpatient Primary MD for the patient is Dorothyann Peng, NP  LOS - 1  Chief Complaint  Patient presents with  . Abnormal Lab       Brief Narrative   Ryan [name] is a 84 y.o. 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  He was admitted to the hospital for reoccurrence of endocarditis with enterococcus bacteremia.   Subjective:    [name] today has, No headache, No chest pain, No abdominal pain - No Nausea, No new weakness tingling or numbness, No Cough - SOB.     Assessment  & Plan :     1.  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  Continue to monitor.  Clinically looks stable.  2.  Chronic combined systolic and diastolic heart failure.  EF close to 50% on last echocardiogram.  Currently on beta-blocker, laced on fluid restriction, due to CKD 4 cannot use ACE/ARB.  3.  Paroxysmal atrial fibrillation [place] vas 2 score of at least 3.   Currently on beta-blocker Eliquis combination continue.  Has pacemaker placed.  4.  CKD 4.  Baseline creatinine around 2 to 2.5 recently, continue to monitor.  5.  CAD status post DES to RCA in 2017.  No chest pain, continue combination of Imdur, beta-blocker, statin and Eliquis.  6.  Essential hypertension.  On combination of Norvasc and beta-blocker will monitor.  7.  Hypothyroidism.  On Synthroid continue.  8.  Dyslipidemia.  On statin continue unchanged.  9. Mild Chr.Thrombocytopenia - in 100K range, monitor.     Family Communication  :  None  Code Status :  Full  Disposition Plan  :  Tele  Consults  :  ID, Cards  Procedures  :    TEE  DVT Prophylaxis  :  Eliquis  Lab Results  Component Value Date   PLT 126 (L) 03/30/2018    Diet :  Diet Order            Diet Heart Room service appropriate? Yes; Fluid consistency: Thin; Fluid restriction: 1200 mL Fluid  Diet effective now        Diet NPO time specified Except for: Sips with Meds  Diet effective midnight               Inpatient Medications Scheduled Meds: . amLODipine  5 mg Oral Daily  . apixaban  2.5 mg Oral BID  . atorvastatin  40 mg Oral q1800  . docusate sodium  100 mg Oral BID  . isosorbide mononitrate  60 mg Oral Daily  . latanoprost  1 drop Both Eyes QHS  . levothyroxine  12.5 mcg Oral QAC breakfast  . lidocaine  1 patch Transdermal Daily  . metoprolol succinate  100 mg Oral Daily  . mometasone-formoterol  2 puff Inhalation BID  . pantoprazole  20 mg Oral Daily   Continuous Infusions: . ampicillin (OMNIPEN) IV 2 g (03/30/18 0540)  . cefTRIAXone (ROCEPHIN)  IV 2 g (03/30/18 1025)   PRN Meds:.acetaminophen **OR** acetaminophen, acetaminophen-codeine, albuterol, ondansetron **OR** ondansetron (ZOFRAN) IV, polyethylene glycol, senna  Antibiotics  :   Anti-infectives (From admission, onward)   Start     Dose/Rate Route Frequency Ordered Stop   03/29/18 2200  ampicillin (OMNIPEN) 2 g in sodium  chloride 0.9 % 100 mL IVPB     2 g 300 mL/hr over 20 Minutes Intravenous Every 8 hours 03/29/18 1628     03/29/18 1215  cefTRIAXone (ROCEPHIN) 2 g in sodium chloride 0.9 % 100 mL IVPB     2 g 200 mL/hr over 30 Minutes Intravenous Every 12 hours 03/29/18 1204     03/29/18 1130  ampicillin (OMNIPEN) 2 g in sodium chloride 0.9 % 100 mL IVPB  Status:  Discontinued     2 g 300 mL/hr over 20 Minutes Intravenous Every 12 hours 03/29/18 1106 03/29/18 1628          Objective:   Vitals:   03/29/18 1915 03/29/18 2010 03/30/18 0540 03/30/18 0747  BP: 130/61 119/62 (!) 156/59   Pulse: 70 68 63 67  Resp: 17 20 16 18   Temp:  99.7 F (37.6 C) 97.7 F (36.5 C)   TempSrc:  Oral Oral   SpO2: 94% 94% 97% 96%    Wt Readings from Last 3 Encounters:  03/28/18 57.6 kg  01/28/18 58.7 kg  01/19/18 58 kg     Intake/Output Summary (Last 24 hours) at 03/30/2018 1146 Last data filed at 03/30/2018 0900 Gross per 24 hour  Intake 600 ml  Output -  Net 600 ml     Physical Exam  Awake Alert, Oriented X 3, No new F.N deficits, Normal affect [place].AT,PERRAL Supple Neck,No JVD, No cervical lymphadenopathy appriciated.  Symmetrical Chest wall movement, Good air movement bilaterally, CTAB RRR,No Gallops,Rubs or new Murmurs, No Parasternal Heave +ve B.Sounds, Abd Soft, No tenderness, No organomegaly appriciated, No rebound - guarding or rigidity. No Cyanosis, Clubbing or edema, No new Rash or bruise  Data Review:    CBC Recent Labs  Lab 03/28/18 1656 03/29/18 1054 03/30/18 0743  WBC 8.3 12.5* 7.6  HGB 10.6* 10.4* 10.9*  HCT 33.3* 33.1* 34.9*  PLT 120* 123* 126*  MCV 92.8 93.8 93.1  MCH 29.5 29.5 29.1  MCHC 31.8 31.4 31.2  RDW 13.1 13.2 13.2  LYMPHSABS  --  1.0  --   MONOABS  --  1.2*  --   EOSABS  --  0.0  --   BASOSABS  --  0.0  --     Chemistries  Recent Labs  Lab 03/28/18 1656 03/29/18 1054 03/30/18 0743  NA 140 139 141  K 4.4 4.1 3.8  CL 107 107 108  CO2 22 24 24     GLUCOSE 119* 159* 107*  BUN 34* 35* 29*  CREATININE 2.03* 2.30* 2.18*  CALCIUM 8.9 8.8* 8.9  AST 20 21  --   ALT 14 13  --   ALKPHOS 69 73  --   BILITOT 0.7 0.8  --    ------------------------------------------------------------------------------------------------------------------ No results for input(s): CHOL, HDL, LDLCALC, TRIG, CHOLHDL, LDLDIRECT in the last 72 hours.  Lab Results  Component Value Date   HGBA1C 5.5 06/06/2016   ------------------------------------------------------------------------------------------------------------------ No results for input(s): TSH, T4TOTAL, T3FREE, THYROIDAB in the last 72 hours.  Invalid input(s): FREET3 ------------------------------------------------------------------------------------------------------------------ No results for input(s): VITAMINB12, FOLATE, FERRITIN, TIBC, IRON, RETICCTPCT in the last 72 hours.  Coagulation profile No results for input(s): INR, PROTIME in the last 168 hours.  No results for input(s): DDIMER in the last 72 hours.  Cardiac Enzymes No results for input(s): CKMB, TROPONINI, MYOGLOBIN in the last 168 hours.  Invalid input(s): CK ------------------------------------------------------------------------------------------------------------------    Component Value Date/Time   BNP 219.3 (H) 11/07/2017 1324    Micro Results Recent Results (from the past 240 hour(s))  Blood culture (routine x 2)     Status: None (Preliminary result)   Collection Time: 03/28/18  4:56 PM  Result Value Ref Range Status   Specimen Description   Final    BLOOD RIGHT ARM Performed at [place] [address], [place], [place] [address]    Special Requests   Final    BAA BCHV Performed at [facility], [place] [address], [place], [place] [address]    Culture  Setup Time   Final    GRAM POSITIVE COCCI IN BOTH AEROBIC AND ANAEROBIC BOTTLES CRITICAL VALUE NOTED.  VALUE IS  CONSISTENT WITH PREVIOUSLY REPORTED AND CALLED VALUE. Performed at [facility], [place] [address] [place], [place] [address]    Culture GRAM POSITIVE COCCI  Final   Report Status PENDING  Incomplete  Blood culture (routine x 2)     Status: None (Preliminary result)   Collection Time: 03/28/18  5:20 PM  Result Value Ref Range Status   Specimen Description   Final    BLOOD LEFT ARM Performed at [place] [address], [place], [place] [address]    Special Requests   Final    BAA BCHV Performed at [facility], [place] [address], [place], [place] [address]    Culture  Setup Time   Final    GRAM POSITIVE COCCI IN BOTH AEROBIC AND ANAEROBIC BOTTLES CRITICAL RESULT CALLED TO, READ BACK BY AND VERIFIED WITH: RN A HARTLEY [id number] MLM Performed at [facility], [address], [place],  [address]    Culture [facility] POSITIVE COCCI  Final  Report Status PENDING  Incomplete  Blood Culture ID Panel (Reflexed)     Status: Abnormal   Collection Time: 03/28/18  5:20 PM  Result Value Ref Range Status   Enterococcus species DETECTED (A) NOT DETECTED Final    Comment: CRITICAL RESULT CALLED TO, READ BACK BY AND VERIFIED WITH: RN A HARTLEY [phone] MLM    Vancomycin resistance NOT DETECTED NOT DETECTED Final   Listeria monocytogenes NOT DETECTED NOT DETECTED Final   Staphylococcus species NOT DETECTED NOT DETECTED Final   Staphylococcus aureus (BCID) NOT DETECTED NOT DETECTED Final   Streptococcus species NOT DETECTED NOT DETECTED Final   Streptococcus agalactiae NOT DETECTED NOT DETECTED Final   Streptococcus pneumoniae NOT DETECTED NOT DETECTED Final   Streptococcus pyogenes NOT DETECTED NOT DETECTED Final   Acinetobacter baumannii NOT DETECTED NOT DETECTED Final   Enterobacteriaceae species NOT DETECTED NOT DETECTED Final   Enterobacter cloacae complex NOT DETECTED NOT DETECTED Final   Escherichia coli NOT DETECTED NOT DETECTED  Final   Klebsiella oxytoca NOT DETECTED NOT DETECTED Final   Klebsiella pneumoniae NOT DETECTED NOT DETECTED Final   Proteus species NOT DETECTED NOT DETECTED Final   Serratia marcescens NOT DETECTED NOT DETECTED Final   Haemophilus influenzae NOT DETECTED NOT DETECTED Final   Neisseria meningitidis NOT DETECTED NOT DETECTED Final   Pseudomonas aeruginosa NOT DETECTED NOT DETECTED Final   Candida albicans NOT DETECTED NOT DETECTED Final   Candida glabrata NOT DETECTED NOT DETECTED Final   Candida krusei NOT DETECTED NOT DETECTED Final   Candida parapsilosis NOT DETECTED NOT DETECTED Final   Candida tropicalis NOT DETECTED NOT DETECTED Final    Comment: Performed at [facility], [address], [place], [place] [address]  Culture, blood (routine x 2)     Status: None (Preliminary result)   Collection Time: 03/29/18  1:49 PM  Result Value Ref Range Status   Specimen Description BLOOD LEFT ANTECUBITAL  Final   Special Requests   Final    BOTTLES DRAWN AEROBIC AND ANAEROBIC Blood Culture adequate volume   Culture  Setup Time   Final    GRAM POSITIVE COCCI IN BOTH AEROBIC AND ANAEROBIC BOTTLES CRITICAL RESULT CALLED TO, READ BACK BY AND VERIFIED WITH: [place] [id number] FCP Performed at [facility], [place] [address], [place], [place] [address]    Culture GRAM POSITIVE COCCI  Final   Report Status PENDING  Incomplete  Blood Culture ID Panel (Reflexed)     Status: Abnormal   Collection Time: 03/29/18  1:49 PM  Result Value Ref Range Status   Enterococcus species DETECTED (A) NOT DETECTED Final    Comment: CRITICAL RESULT CALLED TO, READ BACK BY AND VERIFIED WITH: PHARMD JAMES L. [phone]    Vancomycin resistance NOT DETECTED NOT DETECTED Final   Listeria monocytogenes NOT DETECTED NOT DETECTED Final   Staphylococcus species NOT DETECTED NOT DETECTED Final   Staphylococcus aureus (BCID) NOT DETECTED NOT DETECTED Final   Streptococcus species NOT DETECTED NOT  DETECTED Final   Streptococcus agalactiae NOT DETECTED NOT DETECTED Final   Streptococcus pneumoniae NOT DETECTED NOT DETECTED Final   Streptococcus pyogenes NOT DETECTED NOT DETECTED Final   Acinetobacter baumannii NOT DETECTED NOT DETECTED Final   Enterobacteriaceae species NOT DETECTED NOT DETECTED Final   Enterobacter cloacae complex NOT DETECTED NOT DETECTED Final   Escherichia coli NOT DETECTED NOT DETECTED Final   Klebsiella oxytoca NOT DETECTED NOT DETECTED Final   Klebsiella pneumoniae NOT DETECTED  NOT DETECTED Final   Proteus species NOT DETECTED NOT DETECTED Final   Serratia marcescens NOT DETECTED NOT DETECTED Final   Haemophilus influenzae NOT DETECTED NOT DETECTED Final   Neisseria meningitidis NOT DETECTED NOT DETECTED Final   Pseudomonas aeruginosa NOT DETECTED NOT DETECTED Final   Candida albicans NOT DETECTED NOT DETECTED Final   Candida glabrata NOT DETECTED NOT DETECTED Final   Candida krusei NOT DETECTED NOT DETECTED Final   Candida parapsilosis NOT DETECTED NOT DETECTED Final   Candida tropicalis NOT DETECTED NOT DETECTED Final    Comment: Performed at [facility], [place] [address], [place], [place] [address]    Radiology Reports Dg Chest 2 View  Result Date: 03/28/2018 CLINICAL DATA:  Fever 2 days ago returning today. Shortness of breath on exertion. Generalized weakness. EXAM: CHEST - 2 VIEW COMPARISON:  01/15/2018 FINDINGS: Status post endovascular aortic valve replacement. Heart is top-normal in size. No mediastinal or hilar masses. No evidence of adenopathy. Clear lungs.  No pleural effusion or pneumothorax. Stable left anterior chest wall sequential pacemaker. Skeletal structures are demineralized but intact. IMPRESSION: No acute cardiopulmonary disease. Electronically Signed   By: Lajean Manes M.D.   On: 03/28/2018 17:43    Time Spent in minutes  30   Lala Lund M.D on 03/30/2018 at 11:46 AM  To page go to [email or web address] - password [facility]

## 2018-03-30 NOTE — Progress Notes (Signed)
PHARMACY - PHYSICIAN COMMUNICATION CRITICAL VALUE ALERT - BLOOD CULTURE IDENTIFICATION (BCID)  Ryan [name] is an 84 y.o. male who presented to [facility] on 03/29/2018 with a chief complaint of enterococcal bacteremia  Name of physician (or Provider) Contacted: Dr. Candiss Norse ([facility])  Current antibiotics: Ampicillin/Ceftriaxone  Changes to prescribed antibiotics recommended:  Cont current anti-biotics as recommended by ID   Results for orders placed or performed during the hospital encounter of 03/29/18  Blood Culture ID Panel (Reflexed) (Collected: 03/29/2018  1:49 PM)  Result Value Ref Range   Enterococcus species DETECTED (A) NOT DETECTED   Vancomycin resistance NOT DETECTED NOT DETECTED   Listeria monocytogenes NOT DETECTED NOT DETECTED   Staphylococcus species NOT DETECTED NOT DETECTED   Staphylococcus aureus (BCID) NOT DETECTED NOT DETECTED   Streptococcus species NOT DETECTED NOT DETECTED   Streptococcus agalactiae NOT DETECTED NOT DETECTED   Streptococcus pneumoniae NOT DETECTED NOT DETECTED   Streptococcus pyogenes NOT DETECTED NOT DETECTED   Acinetobacter baumannii NOT DETECTED NOT DETECTED   Enterobacteriaceae species NOT DETECTED NOT DETECTED   Enterobacter cloacae complex NOT DETECTED NOT DETECTED   Escherichia coli NOT DETECTED NOT DETECTED   Klebsiella oxytoca NOT DETECTED NOT DETECTED   Klebsiella pneumoniae NOT DETECTED NOT DETECTED   Proteus species NOT DETECTED NOT DETECTED   Serratia marcescens NOT DETECTED NOT DETECTED   Haemophilus influenzae NOT DETECTED NOT DETECTED   Neisseria meningitidis NOT DETECTED NOT DETECTED   Pseudomonas aeruginosa NOT DETECTED NOT DETECTED   Candida albicans NOT DETECTED NOT DETECTED   Candida glabrata NOT DETECTED NOT DETECTED   Candida krusei NOT DETECTED NOT DETECTED   Candida parapsilosis NOT DETECTED NOT DETECTED   Candida tropicalis NOT DETECTED NOT DETECTED    [name] 03/30/2018  7:26 AM

## 2018-03-30 NOTE — Consult Note (Signed)
Cardiology Consultation:   Patient ID: Ryan [name]; [id number]; 08-14-1929   Admit date: 03/29/2018 Date of Consult: 03/30/2018  Primary Care Provider: Dorothyann Peng, NP Primary Cardiologist: Dr. Ena Dawley, MD  Primary Electrophysiologist: Dr. Cristopher Peru, MD   Patient Profile:   Ryan [name] is a 84 y.o. male with a hx of CAD s/p PCI/DES to RCA 04/2016, AS s/p TAVR 06/2016, carotid artery disease s/p b/l CEA, chronic combined CHF, SSS s/p PPM, HTN, HLD, paroxysmal atrial fibrillation on eliquis, claudication, and OSA who is being seen today for the evaluation of recurrent bacterial endocarditis at the request of Dr. Lorin Mercy.  History of Present Illness:   Ryan [name] is an 84yo M with a hx as stated above who initially presented to [facility] on 03/28/18 with c/o fever of 102 which began Sunday afternoon. Given his prior history, blood cultures were drawn and he was released home. Unfortunately, his fever persisted and his blood cultures came back + for enterococcus. He was called and asked to come to the hospital for admission for possible reurrent endocarditis with enterococcus bacteremia. Upon presentation, per chart review he denied chest pain, palpitations, SOB, dizziness, LE swelling, orthopnea symptoms or syncope. He reported recent fatigue along with the spikes in his temperature. Given his hx of recent endocarditis, ID was consulted as well as Cardiology for further monitoring and evaluation. He underwent a TEE today, 03/30/18 with pending results.   Of note, the patient initially presented to ED on 11/07/2017 with progressive left lower back pain of three days duration not relieved by pain medications.Prior to that he was diagnosed with amiodarone induced lung toxicity at which time he was started on prednisone and spironolactone. He was doing better until three daysprior to [name] he developed worsening left low back pain and profound fatigue.He was admitted on  11/07/2017 and diagnosed with enterococcal bacteremia withprosthetic valve endocarditis. There was a large vegetation seen on bioprosthetic aortic valve, no vegetation was seen on pacemaker leads.Patient was placed on IV antibiotics, ampicillin and Rocephin for the endocarditis. He was discharged to skilled nursing facility to complete course of antibiotics, completed on 12/22/2017. During the hospital stay, the patient developed an acute kidney injury believed to be secondary to dehydration and possible endocarditis embolization.  The patient was then readmitted on 01/15/2018 with c/o chest pain with elevated troponin. Cardiology was consulted and a heart catheterization was completed on 01/15/18 which showed widely patent mid RCA stent and the remainder of his anatomy was unchanged from prior cath with the etiology of his chest pain not clearly supported. Medical therapy was recommended.  He was treated for acute combined systolic and diastolic heart failure and discharged on 01/19/18.   He was then seen in follow up by his primary Cardiologist on 01/28/18 and was feeling better. He had no complaints of chest pain, fevers or chills. Plans were made for close follow up scheduled for 04/20/18.    Past Medical History:  Diagnosis Date  . Age-related macular degeneration   . Allergic rhinitis   . 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 Arthritis    "hands, lower back" (04/30/2016)  . Asthma   . Carotid artery obstruction    a. s/p bilat CEA.  . Chronic combined systolic (congestive) and diastolic (congestive) heart failure ([place]) 03/18/2016  . Chronic lower back pain   . Claudication in peripheral vascular disease ([place]) 01/01/2018   Claudication  . Coronary arteriosclerosis in native artery    a. 2017 Cardiac catheterization  demonstrated worsening CAD with 70% mid LCx, 80% OM1 and 80% mid RCA stenosis. b.  In 11/17, he underwent successful rotational atherectomy and DES to RCA.  Marland Kitchen Decreased  diffusion capacity 10/23/2017  . Diverticulitis of colon   . DJD (degenerative joint disease)   . Elevated TSH 06/30/2017  . GERD (gastroesophageal reflux disease)   . History of hiatal hernia   . HLD (hyperlipidemia)   . Hypertension   . Hypertensive heart disease with heart failure ([place])   . Intractable back pain 11/07/2017  . OSA (obstructive sleep apnea)    intolerant to CPAP  . PAF (paroxysmal atrial fibrillation) ([place])   . Paroxysmal supraventricular tachycardia ([place])   . Presence of permanent cardiac pacemaker   . Primary malignant neoplasm of bladder ([place])   . Prostate cancer ([place])   . 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 S/P AVR 12/02/2017  . Syncope 10/23/2017  . Tachy-brady syndrome ([place])   . Thrombocytopenia ([place]) 11/07/2017  . Urinary retention 11/07/2017    Past Surgical History:  Procedure Laterality Date  . APPENDECTOMY    . CARDIAC CATHETERIZATION N/A 01/17/2016   Procedure: Right/Left Heart Cath and Coronary Angiography;  Surgeon: Belva Crome, MD;  Location: [place] [facility];  Service: Cardiovascular;  Laterality: N/A;  . CARDIAC CATHETERIZATION N/A 04/30/2016   Procedure: Coronary/Graft Atherectomy;  Surgeon: Sherren Mocha, MD;  Location: [place] [facility];  Service: Cardiovascular;  Laterality: N/A;  . CAROTID ENDARTERECTOMY Bilateral   . CATARACT EXTRACTION W/ INTRAOCULAR LENS  IMPLANT, BILATERAL Bilateral   . COLON SURGERY  1981   Meckles Diverticulum with volvulus  . CORONARY ANGIOGRAPHY N/A 01/18/2018   Procedure: CORONARY ANGIOGRAPHY;  Surgeon: Lorretta Harp, MD;  Location: [place] [facility];  Service: Cardiovascular;  Laterality: N/A;  . EP IMPLANTABLE DEVICE N/A 03/21/2016   Procedure: Pacemaker Implant;  Surgeon: Evans Lance, MD;  Location: [place] [facility];  Service: Cardiovascular;  Laterality: N/A;  . EYE SURGERY    . INGUINAL HERNIA REPAIR Right 2009  . INSERT / REPLACE / REMOVE PACEMAKER    .  INSERTION PROSTATE RADIATION SEED    . TEE WITHOUT CARDIOVERSION N/A 06/10/2016   Procedure: TRANSESOPHAGEAL ECHOCARDIOGRAM (TEE);  Surgeon: Sherren Mocha, MD;  Location: [place];  Service: Open Heart Surgery;  Laterality: N/A;  . TEE WITHOUT CARDIOVERSION N/A 11/10/2017   Procedure: TRANSESOPHAGEAL ECHOCARDIOGRAM (TEE);  Surgeon: Acie Fredrickson Wonda Cheng, MD;  Location: [facility];  Service: Cardiovascular;  Laterality: N/A;  . TEE WITHOUT CARDIOVERSION N/A 12/15/2017   Procedure: TRANSESOPHAGEAL ECHOCARDIOGRAM (TEE);  Surgeon: Buford Dresser, MD;  Location: [facility] ENDOSCOPY;  Service: Cardiovascular;  Laterality: N/A;  . TONSILLECTOMY  ~ 1947  . TRANSCATHETER AORTIC VALVE REPLACEMENT, TRANSFEMORAL N/A 06/10/2016   Procedure: TRANSCATHETER AORTIC VALVE REPLACEMENT, TRANSFEMORAL;  Surgeon: Sherren Mocha, MD;  Location: [place];  Service: Open Heart Surgery;  Laterality: N/A;     Prior to Admission medications   Medication Sig Start Date End Date Taking? Authorizing Provider  albuterol (PROVENTIL) (2.5 MG/3ML) 0.083% nebulizer solution Take 3 mLs (2.5 mg total) by nebulization every 8 (eight) hours as needed for wheezing or shortness of breath. 10/13/17  Yes Nafziger, Tommi Rumps, NP  amLODipine (NORVASC) 5 MG tablet Take 1 tablet (5 mg total) by mouth daily. 01/20/18  Yes Kroeger, Daleen Snook M., PA-C  apixaban (ELIQUIS) 2.5 MG TABS tablet Take 1 tablet (2.5 mg total) by mouth 2 (two) times daily. 10/22/17  Yes Cristopher Peru  W, MD  atorvastatin (LIPITOR) 40 MG tablet Take 1 tablet (40 mg total) by mouth daily at 6 PM. 01/19/18  Yes Kroeger, Daleen Snook M., PA-C  budesonide-formoterol (SYMBICORT) 80-4.5 MCG/ACT inhaler Inhale 2 puffs into the lungs 2 (two) times daily. 10/20/17  Yes Lauraine Rinne, NP  cholecalciferol (VITAMIN D) 1000 units tablet Take 1,000 Units by mouth 2 (two) times a week.    Yes [provider]  isosorbide mononitrate (IMDUR) 60 MG 24 hr tablet Take 1 tablet (60 mg total) by mouth daily. 01/20/18   Yes Kroeger, Daleen Snook M., PA-C  lansoprazole (PREVACID) 15 MG capsule Take 1 capsule (15 mg total) by mouth daily at 12 noon. Patient taking differently: Take 15 mg by mouth daily as needed (heartburn.).  01/28/18  Yes Dorothy Spark, MD  latanoprost (XALATAN) 0.005 % ophthalmic solution Place 1 drop into both eyes at bedtime.   Yes [provider]  levothyroxine (SYNTHROID, LEVOTHROID) 25 MCG tablet Take 0.5 tablets (12.5 mcg total) by mouth daily before breakfast. 10/21/17  Yes Dorothy Spark, MD  menthol-cetylpyridinium (CEPACOL) 3 MG lozenge Take 1 lozenge (3 mg total) by mouth as needed for sore throat. 10/24/17  Yes Lavina Hamman, MD  metoprolol succinate (TOPROL-XL) 100 MG 24 hr tablet Take 1 tablet (100 mg total) by mouth daily. Take with or immediately following a meal. 01/20/18  Yes Kroeger, Daleen Snook M., PA-C  Netarsudil Dimesylate (RHOPRESSA) 0.02 % SOLN Place 1 drop into both eyes at bedtime.   Yes [provider]  nitroGLYCERIN (NITROSTAT) 0.4 MG SL tablet Place 1 tablet (0.4 mg total) under the tongue every 5 (five) minutes x 3 doses as needed for chest pain. 01/19/18  Yes Kroeger, Daleen Snook M., PA-C  polyethylene glycol (MIRALAX / GLYCOLAX) packet Take 17 g by mouth daily. Patient taking differently: Take 17 g by mouth daily as needed for moderate constipation.  11/14/17  Yes Bonnell Public, MD  senna (SENOKOT) 8.6 MG TABS tablet Take 1 tablet (8.6 mg total) by mouth daily as needed for mild constipation. 11/14/17  Yes Bonnell Public, MD  vitamin B-12 (CYANOCOBALAMIN) 1000 MCG tablet Take 1,000 mcg by mouth daily.   Yes [provider]    Inpatient Medications: Scheduled Meds: . amLODipine  5 mg Oral Daily  . apixaban  2.5 mg Oral BID  . atorvastatin  40 mg Oral q1800  . docusate sodium  100 mg Oral BID  . isosorbide mononitrate  60 mg Oral Daily  . latanoprost  1 drop Both Eyes QHS  . levothyroxine  12.5 mcg Oral QAC breakfast  . lidocaine  1  patch Transdermal Daily  . metoprolol succinate  100 mg Oral Daily  . mometasone-formoterol  2 puff Inhalation BID  . pantoprazole  20 mg Oral Daily   Continuous Infusions: . ampicillin (OMNIPEN) IV 2 g (03/30/18 1332)  . cefTRIAXone (ROCEPHIN)  IV 2 g (03/30/18 1025)   PRN Meds: acetaminophen **OR** acetaminophen, acetaminophen-codeine, albuterol, ondansetron **OR** ondansetron (ZOFRAN) IV, polyethylene glycol, senna  Allergies:    Allergies  Allergen Reactions  . Ciprofloxacin Anaphylaxis  . Hydrochlorothiazide Anaphylaxis  . Sulfa Antibiotics Anaphylaxis  . Ace Inhibitors Cough  . Losartan Cough  . Carvedilol Cough    Pt states it "causes him too cough."  . Lisinopril Cough    Pt reports worsening cough and "feeling wheezy."  . Soy Allergy Nausea And Vomiting    Social History:   Social History   Socioeconomic History  .  Marital status: Married    Spouse name: Not on file  . Number of children: Not on file  . Years of education: Not on file  . Highest education level: Not on file  Occupational History  . Occupation: retired  Scientific laboratory technician  . Financial resource strain: Not on file  . Food insecurity:    Worry: Not on file    Inability: Not on file  . Transportation needs:    Medical: Not on file    Non-medical: Not on file  Tobacco Use  . Smoking status: Never Smoker  . Smokeless tobacco: Never Used  Substance and Sexual Activity  . Alcohol use: No    Alcohol/week: 0.0 standard drinks    Comment: 04/30/2016 "nothing in years"  . Drug use: No  . Sexual activity: Never  Lifestyle  . Physical activity:    Days per week: Not on file    Minutes per session: Not on file  . Stress: Not on file  Relationships  . Social connections:    Talks on phone: Not on file    Gets together: Not on file    Attends religious service: Not on file    Active member of club or organization: Not on file    Attends meetings of clubs or organizations: Not on file     Relationship status: Not on file  . Intimate partner violence:    Fear of current or ex partner: Not on file    Emotionally abused: Not on file    Physically abused: Not on file    Forced sexual activity: Not on file  Other Topics Concern  . Not on file  Social History Narrative   Retired    Three children - One in [place], One in [place], and one in [place].        Family History:   Family History  Problem Relation Age of Onset  . Heart disease Mother   . Heart disease Father    Family Status:  Family Status  Relation Name Status  . Mother  Deceased  . Father  Deceased  . MGM  Deceased  . MGF  Deceased  . PGM  Deceased  . PGF  Deceased    ROS:  Please see the history of present illness.  All other ROS reviewed and negative.     Physical Exam/Data:   Vitals:   03/29/18 1915 03/29/18 2010 03/30/18 0540 03/30/18 0747  BP: 130/61 119/62 (!) 156/59   Pulse: 70 68 63 67  Resp: 17 20 16 18   Temp:  99.7 F (37.6 C) 97.7 F (36.5 C)   TempSrc:  Oral Oral   SpO2: 94% 94% 97% 96%    Intake/Output Summary (Last 24 hours) at 03/30/2018 1400 Last data filed at 03/30/2018 1149 Gross per 24 hour  Intake 600 ml  Output -  Net 600 ml   There were no vitals filed for this visit. There is no height or weight on file to calculate BMI.    EKG:  The EKG was personally reviewed and demonstrates: 03/29/18 NSR HR 62 Telemetry:  Telemetry was personally reviewed and demonstrates: 03/30/18 Atrial paced/NSR , HR 60-70's   Relevant CV Studies:  TEE 11/2017: Study Conclusions  - Left ventricle: Systolic function was normal. The estimated ejection fraction was in the range of 50% to 55%. Wall motion was normal; there were no regional wall motion abnormalities. - Aortic valve: A prosthesis was present and functioning normally. The prosthesis had a normal  range of motion. The sewing ring appeared normal, had no rocking motion, and showed no evidence of dehiscence. No  evidence of vegetation. Large vegetations seen previously are no longer visible. There was trivial paravalvular regurgitation. - Mitral valve: No evidence of vegetation. - Left atrium: No evidence of thrombus in the atrial cavity or appendage. - Right atrium: No evidence of thrombus in the atrial cavity or appendage. - Atrial septum: No defect or patent foramen ovale was identified. There was an atrial septal aneurysm. - Tricuspid valve: No evidence of vegetation. - Pulmonic valve: No evidence of vegetation. - Line: A venous pacing wire was visualized in the right atrial cavity and right ventricular cavity, with its tip at the RV apex. On image two (upper left corner), small oscillating fibrinous structures were seen on the pacer leads. These were not well visualized in additional views.  Impressions:  - No evidence of endocarditis.  Right/Left heart catheterization 01/2016:  Prox RCA to Mid RCA lesion, 20 %stenosed.  Mid RCA lesion, 80 %stenosed.  Ost 1st Mrg to 1st Mrg lesion, 80 %stenosed.  Mid Cx to Dist Cx lesion, 70 %stenosed.  Prox LAD to Dist LAD lesion, 40 %stenosed.  The left ventricular systolic function is normal.  LV end diastolic pressure is normal.  The left ventricular ejection fraction is 55-65% by visual estimate.  LV end diastolic pressure is normal.   There is three-vessel coronary calcification.  80% eccentric mid RCA stenosis.  80% ostial obtuse marginal #1 stenosis.  Moderate diffuse calcified LAD disease less than 50%.  Normal left ventricular systolic function and hemodynamics  Normal pulmonary pressures and pulmonary capillary wedge.  Moderate to severe aortic stenosis with aortic valve area 1.04.   RECOMMENDATIONS:   Anti-ischemic therapy to potentially include beta blocker therapy, antiplatelet therapy, and statin therapy.  Intervention on both the right coronary and obtuse marginal stenosis would be  higher than usual risk due to heavy calcification and vessel tortuosity. Initial inclination is medical therapy in the absence of clear anginal symptoms.  Fatigue even at rest is of uncertain etiology. Exclude other possibilities including depression, although on one systemic illnesses.  Coronary atherectomy 04/2016: Successful rotational atherectomy and stenting of the right coronary artery with a 3.0 x 12 mm Synergy DES.  Recommendations: Triple therapy with aspirin 81 mg, Plavix 75 mg, and apixaban 30 days, then discontinue aspirin. Plans for TAVR in approximately 3-4 weeks.  Catheterization 01/18/18:  Prox Cx lesion is 70% stenosed.  Ost 1st Mrg lesion is 75% stenosed.  Previously placed Mid RCA stent (unknown type) is widely patent.  Prox RCA lesion is 40% stenosed.  Post Atrio lesion is 75% stenosed.  Ost LAD to Prox LAD lesion is 30% stenosed.  IMPRESSION:Mr. Guzzetta's mid RCA stent is widely patent. He does have a 40% calcified proximal RCA stenosis on the first bend which does not appear to be obstructive nor different from his prior cath. The remainder of his anatomy is unchanged. The etiology of his chest pain is not supported by any change in his coronary anatomy. Medical therapy is recommended. His sheath will be removed and pressure held. The patient left the lab in stable condition.  Laboratory Data:  Chemistry Recent Labs  Lab 03/28/18 1656 03/29/18 1054 03/30/18 0743  NA 140 139 141  K 4.4 4.1 3.8  CL 107 107 108  CO2 22 24 24   GLUCOSE 119* 159* 107*  BUN 34* 35* 29*  CREATININE 2.03* 2.30* 2.18*  CALCIUM 8.9 8.8* 8.9  GFRNONAA 28* 24* 26*  GFRAA 33* 28* 30*  ANIONGAP 11 8 9     Total Protein  Date Value Ref Range Status  03/29/2018 5.9 (L) 6.5 - 8.1 g/dL Final  12/02/2017 5.7 (L) 6.0 - 8.5 g/dL Final   Albumin  Date Value Ref Range Status  03/29/2018 3.3 (L) 3.5 - 5.0 g/dL Final  12/02/2017 3.6 3.5 - 4.7 g/dL Final   AST  Date Value  Ref Range Status  03/29/2018 21 15 - 41 U/L Final   ALT  Date Value Ref Range Status  03/29/2018 13 0 - 44 U/L Final   Alkaline Phosphatase  Date Value Ref Range Status  03/29/2018 73 38 - 126 U/L Final   Total Bilirubin  Date Value Ref Range Status  03/29/2018 0.8 0.3 - 1.2 mg/dL Final   Bilirubin Total  Date Value Ref Range Status  12/02/2017 0.3 0.0 - 1.2 mg/dL Final   Hematology Recent Labs  Lab 03/28/18 1656 03/29/18 1054 03/30/18 0743  WBC 8.3 12.5* 7.6  RBC 3.59* 3.53* 3.75*  HGB 10.6* 10.4* 10.9*  HCT 33.3* 33.1* 34.9*  MCV 92.8 93.8 93.1  MCH 29.5 29.5 29.1  MCHC 31.8 31.4 31.2  RDW 13.1 13.2 13.2  PLT 120* 123* 126*   Cardiac EnzymesNo results for input(s): TROPONINI in the last 168 hours. No results for input(s): TROPIPOC in the last 168 hours.  BNPNo results for input(s): BNP, PROBNP in the last 168 hours.  DDimer No results for input(s): DDIMER in the last 168 hours. TSH:  Lab Results  Component Value Date   TSH 3.390 01/14/2018   Lipids: Lab Results  Component Value Date   CHOL 130 05/01/2017   HDL 56.00 05/01/2017   LDLCALC 60 05/01/2017   TRIG 70.0 05/01/2017   CHOLHDL 2 05/01/2017   HgbA1c: Lab Results  Component Value Date   HGBA1C 5.5 06/06/2016   Radiology/Studies:  Dg Chest 2 View  Result Date: 03/28/2018 CLINICAL DATA:  Fever 2 days ago returning today. Shortness of breath on exertion. Generalized weakness. EXAM: CHEST - 2 VIEW COMPARISON:  01/15/2018 FINDINGS: Status post endovascular aortic valve replacement. Heart is top-normal in size. No mediastinal or hilar masses. No evidence of adenopathy. Clear lungs.  No pleural effusion or pneumothorax. Stable left anterior chest wall sequential pacemaker. Skeletal structures are demineralized but intact. IMPRESSION: No acute cardiopulmonary disease. Electronically Signed   By: Lajean Manes M.D.   On: 03/28/2018 17:43   Assessment and Plan:   1. Recurrent bioprosthetic aortic valve  endocarditis/bacteremia s/p TAVR 2018: -Initially diagnosed 11/13/2017 and treated with IV ampicillin and Rocephin for 6 weeks duration until 12/22/17. Follow up TEE and blood cultures performed 12/15/17 showed no evidence of endocarditis or growth. PPM leads were not affected.  -Pt [facility] on [facility]28[facility]18 with c/o of persistent fever found to have positive blood cultures and was admitted for further evaluation with IM, ID and Cardiology input.  -Repeat TEE performed today with pending results -Placed on IV ampicillin along with Rocephin per ID ] -High concern for vegetation per ID   2. CAD s/p DES/PCI in 2017: -Last cath 10/2017 with patent RCA stent and no other obstructive findings with medical therapy recommended  -Continue statin, BB, no ASA secondary to Eliquis     3. Paroxysmal atrial fibrillation: -Stable, HR 60-70's  -Hx of Amiodarone toxicity>>now rate controlled with BB -Continue low dose Eliquis for AC -No acute s/s of active bleeding  -CHA2DS2VASc =3  4. Chronic combined  CHF with cardiomyopathy:  -LVEF 50-55% per TEE performed 12/15/17  -Appears euvolemic on exam  -Weight, not on file  -I&O, positive 724ml  -Continue daily weights, strict I&O -Continue BB, no ACEI/ARB secondary to renal dysfunction   5. SSS s/p PPM: -Normal functioning, last interrogated 12/02/17 -Managed per EP, Dr. Lovena Le  6. Claudication with abnormal ABI's: -Medical management per Dr. Gwenlyn Found   7. HLD: -Stable, last LDL, 60 05/01/17 at goal of <70 -Continue statin   8. HTN: -Stable, 140/39>147/50>144/57 -Continue metoprolol, amlodipine   Final plan for MD>>to follow    For questions or updates, please contact [facility] Please consult [email or web address] for contact info under Cardiology/STEMI.   SignedKathyrn Drown NP-C [facility] Pager: [phone] 03/30/2018 2:00 PM

## 2018-03-30 NOTE — H&P (View-Only) (Signed)
[facility] for Infectious Disease   Reason for visit: Follow up on bacteremia  Interval History: 1/2 blood cultures positive from yesterday, WBC wnl, afebrile;  No rash.  TEE today   Physical Exam: Constitutional:  Vitals:   03/30/18 0540 03/30/18 0747  BP: (!) 156/59   Pulse: 63 67  Resp: 16 18  Temp: 97.7 F (36.5 C)   SpO2: 97% 96%   patient appears in NAD Eyes: anicteric Respiratory: Normal respiratory effort; CTA B Cardiovascular: RRR GI: soft, nt, nd  Review of Systems: Constitutional: negative for fevers and chills Gastrointestinal: negative for diarrhea  Lab Results  Component Value Date   WBC 7.6 03/30/2018   HGB 10.9 (L) 03/30/2018   HCT 34.9 (L) 03/30/2018   MCV 93.1 03/30/2018   PLT 126 (L) 03/30/2018    Lab Results  Component Value Date   CREATININE 2.18 (H) 03/30/2018   BUN 29 (H) 03/30/2018   NA 141 03/30/2018   K 3.8 03/30/2018   CL 108 03/30/2018   CO2 24 03/30/2018    Lab Results  Component Value Date   ALT 13 03/29/2018   AST 21 03/29/2018   ALKPHOS 73 03/29/2018     Microbiology: Recent Results (from the past 240 hour(s))  Blood culture (routine x 2)     Status: None (Preliminary result)   Collection Time: 03/28/18  4:56 PM  Result Value Ref Range Status   Specimen Description   Final    BLOOD RIGHT ARM Performed at [facility], [place] [address] [place], [place] [address]    Special Requests   Final    BAA BCHV Performed at [facility], [place] [address] [place], [place] [address]    Culture  Setup Time   Final    GRAM POSITIVE COCCI IN BOTH AEROBIC AND ANAEROBIC BOTTLES CRITICAL VALUE NOTED.  VALUE IS CONSISTENT WITH PREVIOUSLY REPORTED AND CALLED VALUE. Performed at [facility], [place] [address], [place], [place] [address]    Culture GRAM POSITIVE COCCI  Final   Report Status PENDING  Incomplete  Blood culture (routine x 2)     Status: None (Preliminary result)   Collection Time: 03/28/18  5:20 PM  Result Value Ref Range Status   Specimen Description   Final    BLOOD LEFT ARM Performed at [facility] [address], [place], [place] [address]    Special Requests   Final    BAA BCHV Performed at [facility], [place] [address], [place], [place] [address]    Culture  Setup Time   Final    GRAM POSITIVE COCCI IN BOTH AEROBIC AND ANAEROBIC BOTTLES CRITICAL RESULT CALLED TO, READ BACK BY AND VERIFIED WITH: RN A HARTLEY [id number] Performed at [facility], [address] [place], [place] [address]    Culture GRAM POSITIVE COCCI  Final   Report Status PENDING  Incomplete  Blood Culture ID Panel (Reflexed)     Status: Abnormal   Collection Time: 03/28/18  5:20 PM  Result Value Ref Range Status   Enterococcus species DETECTED (A) NOT DETECTED Final    Comment: CRITICAL RESULT CALLED TO, READ BACK BY AND VERIFIED WITH: RN A HARTLEY [id number] MLM    Vancomycin resistance NOT DETECTED NOT DETECTED Final   Listeria monocytogenes NOT DETECTED NOT DETECTED Final   Staphylococcus species NOT DETECTED NOT DETECTED Final   Staphylococcus aureus (BCID) NOT DETECTED NOT DETECTED  Final   Streptococcus species NOT DETECTED NOT DETECTED Final   Streptococcus agalactiae NOT DETECTED NOT DETECTED Final   Streptococcus pneumoniae NOT DETECTED NOT DETECTED Final   Streptococcus pyogenes NOT DETECTED NOT DETECTED Final   Acinetobacter baumannii NOT DETECTED NOT DETECTED Final   Enterobacteriaceae species NOT DETECTED NOT DETECTED Final   Enterobacter cloacae complex NOT DETECTED NOT DETECTED Final   Escherichia coli NOT DETECTED NOT DETECTED Final   Klebsiella oxytoca NOT DETECTED NOT DETECTED Final   Klebsiella pneumoniae NOT DETECTED NOT DETECTED Final   Proteus species NOT DETECTED NOT DETECTED Final   Serratia marcescens NOT DETECTED NOT DETECTED Final   Haemophilus influenzae NOT DETECTED NOT DETECTED  Final   Neisseria meningitidis NOT DETECTED NOT DETECTED Final   Pseudomonas aeruginosa NOT DETECTED NOT DETECTED Final   Candida albicans NOT DETECTED NOT DETECTED Final   Candida glabrata NOT DETECTED NOT DETECTED Final   Candida krusei NOT DETECTED NOT DETECTED Final   Candida parapsilosis NOT DETECTED NOT DETECTED Final   Candida tropicalis NOT DETECTED NOT DETECTED Final    Comment: Performed at [facility], [place] [address] [place], [place] [address]  Culture, blood (routine x 2)     Status: None (Preliminary result)   Collection Time: 03/29/18  1:49 PM  Result Value Ref Range Status   Specimen Description BLOOD LEFT ANTECUBITAL  Final   Special Requests   Final    BOTTLES DRAWN AEROBIC AND ANAEROBIC Blood Culture adequate volume   Culture  Setup Time   Final    GRAM POSITIVE COCCI IN BOTH AEROBIC AND ANAEROBIC BOTTLES CRITICAL RESULT CALLED TO, READ BACK BY AND VERIFIED WITH: [place] [id number] Performed at [facility], [place] [address], [place], [place] [address]    Culture GRAM POSITIVE COCCI  Final   Report Status PENDING  Incomplete  Blood Culture ID Panel (Reflexed)     Status: Abnormal   Collection Time: 03/29/18  1:49 PM  Result Value Ref Range Status   Enterococcus species DETECTED (A) NOT DETECTED Final    Comment: CRITICAL RESULT CALLED TO, READ BACK BY AND VERIFIED WITH: PHARMD JAMES L. [id number]    Vancomycin resistance NOT DETECTED NOT DETECTED Final   Listeria monocytogenes NOT DETECTED NOT DETECTED Final   Staphylococcus species NOT DETECTED NOT DETECTED Final   Staphylococcus aureus (BCID) NOT DETECTED NOT DETECTED Final   Streptococcus species NOT DETECTED NOT DETECTED Final   Streptococcus agalactiae NOT DETECTED NOT DETECTED Final   Streptococcus pneumoniae NOT DETECTED NOT DETECTED Final   Streptococcus pyogenes NOT DETECTED NOT DETECTED Final   Acinetobacter baumannii NOT DETECTED NOT DETECTED Final   Enterobacteriaceae  species NOT DETECTED NOT DETECTED Final   Enterobacter cloacae complex NOT DETECTED NOT DETECTED Final   Escherichia coli NOT DETECTED NOT DETECTED Final   Klebsiella oxytoca NOT DETECTED NOT DETECTED Final   Klebsiella pneumoniae NOT DETECTED NOT DETECTED Final   Proteus species NOT DETECTED NOT DETECTED Final   Serratia marcescens NOT DETECTED NOT DETECTED Final   Haemophilus influenzae NOT DETECTED NOT DETECTED Final   Neisseria meningitidis NOT DETECTED NOT DETECTED Final   Pseudomonas aeruginosa NOT DETECTED NOT DETECTED Final   Candida albicans NOT DETECTED NOT DETECTED Final   Candida glabrata NOT DETECTED NOT DETECTED Final   Candida krusei NOT DETECTED NOT DETECTED Final   Candida parapsilosis NOT DETECTED NOT DETECTED Final   Candida tropicalis NOT DETECTED NOT DETECTED Final    Comment:  Performed at [facility], [place] [address] [place], [place] [address]  Culture, blood (routine x 2)     Status: None (Preliminary result)   Collection Time: 03/29/18  1:54 PM  Result Value Ref Range Status   Specimen Description BLOOD LEFT ANTECUBITAL  Final   Special Requests   Final    BOTTLES DRAWN AEROBIC AND ANAEROBIC Blood Culture adequate volume   Culture   Final    NO GROWTH < 24 HOURS Performed at [facility], [place] [address], [place], [place] [address]    Report Status PENDING  Incomplete    Impression/Plan:  1. Bacteremia - again positive blood culture with Enterococcus.  1 on 10/27 and one from yesterday.  High concern for vegetation.   Repeat blood cultures today Continue ceftriaxone and ampicillin.    2.  Leukocytosis - improved and now wnl.   3. Thrombocytopenia - stable.  Will continue to monitor with current medications.

## 2018-03-30 NOTE — Interval H&P Note (Signed)
History and Physical Interval Note:  03/30/2018 3:11 PM  Ryan [name]  has presented today for surgery, with the diagnosis of bacteremia  The various methods of treatment have been discussed with the patient and family. After consideration of risks, benefits and other options for treatment, the patient has consented to  Procedure(s): TRANSESOPHAGEAL ECHOCARDIOGRAM (TEE) (N/A) as a surgical intervention .  The patient's history has been reviewed, patient examined, no change in status, stable for surgery.  I have reviewed the patient's chart and labs.  Questions were answered to the patient's satisfaction.     [employer]

## 2018-03-30 NOTE — CV Procedure (Signed)
   Transesophageal Echocardiogram  Indications: Endocarditis  Time out performed  During this procedure the patient is administered a total of Versed 5 mg and Fentanyl 25 mcg to achieve and maintain moderate conscious sedation.  The patient's heart rate, blood pressure, and oxygen saturation are monitored continuously during the procedure. The period of conscious sedation is 30 minutes, of which I was present face-to-face 100% of this time.  Findings:  Left Ventricle: Normal EF  Mitral Valve: Mild MR, no vegetation  Aortic Valve: TAVR, mild to moderate eccentric AI. No vegetation   Tricuspid Valve: mild TR  Left Atrium: No LAA thrombus  Pacer wires: no obvious vegetations detected  Impression: Overall reassuring TEE. No signs of endocarditis.   Candee Furbish, MD

## 2018-03-31 ENCOUNTER — Encounter (HOSPITAL_COMMUNITY): Payer: Self-pay | Admitting: Cardiology

## 2018-03-31 DIAGNOSIS — R6883 Chills (without fever): Secondary | ICD-10-CM

## 2018-03-31 DIAGNOSIS — Z95810 Presence of automatic (implantable) cardiac defibrillator: Secondary | ICD-10-CM

## 2018-03-31 LAB — CBC
HCT: 28.5 % — ABNORMAL LOW (ref 39.0–52.0)
Hemoglobin: 9 g/dL — ABNORMAL LOW (ref 13.0–17.0)
MCH: 29.3 pg (ref 26.0–34.0)
MCHC: 31.6 g/dL (ref 30.0–36.0)
MCV: 92.8 fL (ref 80.0–100.0)
NRBC: 0 % (ref 0.0–0.2)
PLATELETS: 120 10*3/uL — AB (ref 150–400)
RBC: 3.07 MIL/uL — ABNORMAL LOW (ref 4.22–5.81)
RDW: 13.2 % (ref 11.5–15.5)
WBC: 5.6 10*3/uL (ref 4.0–10.5)

## 2018-03-31 LAB — CULTURE, BLOOD (ROUTINE X 2)

## 2018-03-31 LAB — BASIC METABOLIC PANEL
ANION GAP: 5 (ref 5–15)
BUN: 29 mg/dL — ABNORMAL HIGH (ref 8–23)
CALCIUM: 8.5 mg/dL — AB (ref 8.9–10.3)
CO2: 25 mmol/L (ref 22–32)
Chloride: 111 mmol/L (ref 98–111)
Creatinine, Ser: 2.17 mg/dL — ABNORMAL HIGH (ref 0.61–1.24)
GFR calc Af Amer: 30 mL/min — ABNORMAL LOW (ref >60)
GFR, EST NON AFRICAN AMERICAN: 26 mL/min — AB (ref >60)
Glucose, Bld: 102 mg/dL — ABNORMAL HIGH (ref 70–99)
POTASSIUM: 4 mmol/L (ref 3.5–5.1)
SODIUM: 141 mmol/L (ref 135–145)

## 2018-03-31 LAB — MAGNESIUM: MAGNESIUM: 2.1 mg/dL (ref 1.7–2.4)

## 2018-03-31 NOTE — Progress Notes (Signed)
PROGRESS NOTE                                                                                                                                                                                                             Patient Demographics:    Ryan [name], is a 84 y.o. male, DOB - 01-15-1930, TWS:[id number]  Admit date - 03/29/2018   Admitting Physician Karmen Bongo, MD  Outpatient Primary MD for the patient is Dorothyann Peng, NP  LOS - 2  Chief Complaint  Patient presents with  . Abnormal Lab       Brief Narrative   Ryan [name] is a 84 y.o. 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  He was admitted to the hospital for reoccurrence of endocarditis with enterococcus bacteremia.   Subjective:   Patient in bed, appears comfortable, denies any headache, no fever, no chest pain or pressure, no shortness of breath , no abdominal pain. No focal weakness.    Assessment  & Plan :     1.  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  Repeat surveillance blood cultures obtained 03/30/2018 are negative, if they remain negative for another 24 hours we will proceed with PICC line, tentative plan will be to discharge him on Friday with 4 weeks of IV ampicillin  every 8 hours and IV Rocephin twice daily thereafter long-term oral suppressive treatment per ID.  2.  Chronic combined systolic and diastolic heart failure.  EF close to 50% on last echocardiogram.  Currently on beta-blocker, placed on fluid restriction, due to CKD 4 cannot use ACE/ARB.  3.  Paroxysmal atrial fibrillation [place] vas 2 score of at least 3.  Currently on beta-blocker Eliquis combination continue.  Has pacemaker placed.  4.  CKD 4.  Baseline creatinine around 2 to 2.5 recently, continue to monitor.  5.  CAD status post DES to RCA in 2017.  No chest pain, continue combination of Imdur, beta-blocker, statin and Eliquis.  6.  Essential hypertension.  On combination of Norvasc and beta-blocker will monitor.  7.  Hypothyroidism.  On Synthroid continue.  8.  Dyslipidemia.  On statin continue unchanged.  9. Mild Chr.Thrombocytopenia - in 100K range, monitor.   Lab Results  Component Value Date   PLT 120 (L) 03/31/2018     Family Communication  :  None  Code Status :  Full  Disposition Plan  : Telemetry, likely discharge on Friday if remains stable  Consults  :  ID, Cards  Procedures  :    TEE -   Left Ventricle: Normal EF  Mitral Valve: Mild MR, no vegetation  Aortic Valve: TAVR, mild to moderate eccentric AI. No vegetation   Tricuspid Valve: mild TR  Left Atrium: No LAA thrombus  Pacer wires: no obvious vegetations detected  Impression: Overall reassuring TEE. No signs of endocarditis.    DVT Prophylaxis  :  Eliquis  Diet :  Diet Order            Diet Heart Room service appropriate? Yes; Fluid consistency: Thin  Diet effective now               Inpatient Medications Scheduled Meds: . amLODipine  5 mg Oral Daily  . apixaban  2.5 mg Oral BID  . atorvastatin  40 mg Oral q1800  . docusate sodium  100 mg Oral BID  . isosorbide mononitrate  60 mg Oral Daily  . latanoprost  1 drop Both Eyes QHS  . levothyroxine  12.5 mcg Oral QAC breakfast   . lidocaine  1 patch Transdermal Daily  . metoprolol succinate  100 mg Oral Daily  . mometasone-formoterol  2 puff Inhalation Daily  . pantoprazole  20 mg Oral Daily   Continuous Infusions: . sodium chloride Stopped (03/30/18 2303)  . sodium chloride 10 mL/hr at 03/31/18 0621  . ampicillin (OMNIPEN) IV 2 g (03/31/18 4235)  . cefTRIAXone (ROCEPHIN)  IV Stopped (03/30/18 2251)   PRN Meds:.sodium chloride, sodium chloride, acetaminophen **OR** acetaminophen, acetaminophen-codeine, albuterol, ondansetron **OR** ondansetron (ZOFRAN) IV, polyethylene glycol, senna  Antibiotics  :   Anti-infectives (From admission, onward)   Start     Dose/Rate Route Frequency Ordered Stop   03/29/18 2200  ampicillin (OMNIPEN) 2 g in sodium chloride 0.9 % 100 mL IVPB     2 g 300 mL/hr over 20 Minutes Intravenous Every 8 hours 03/29/18 1628     03/29/18 1215  cefTRIAXone (ROCEPHIN) 2 g in sodium chloride 0.9 % 100 mL IVPB     2 g 200 mL/hr over 30 Minutes Intravenous Every 12 hours 03/29/18 1204     03/29/18 1130  ampicillin (OMNIPEN) 2 g in sodium chloride 0.9 % 100 mL IVPB  Status:  Discontinued     2 g 300 mL/hr over 20 Minutes Intravenous Every 12 hours 03/29/18 1106 03/29/18 1628          Objective:   Vitals:   03/30/18 1651 03/30/18 2107 03/31/18 0549 03/31/18 0820  BP: (!) 153/61 (!) 104/54 (!) 111/54   Pulse: 63 61 64   Resp:  12 12   Temp:  98 F (36.7 C) 98.2 F (36.8 C)   TempSrc:  Oral Oral   SpO2: 97% 95% 96% 97%    Wt Readings from Last 3 Encounters:  03/28/18 57.6 kg  01/28/18 58.7 kg  01/19/18 58 kg     Intake/Output Summary (Last 24 hours) at 03/31/2018 0956 Last data filed at 03/31/2018 0548 Gross per 24 hour  Intake 805.35 ml  Output 150 ml  Net 655.35 ml     Physical Exam  Awake Alert, Oriented X 3, No new F.N deficits, Normal affect [place].AT,PERRAL Supple Neck,No JVD, No cervical lymphadenopathy appriciated.  Symmetrical Chest wall movement, Good air  movement bilaterally, CTAB RRR,No Gallops, Rubs or new Murmurs, No Parasternal Heave +ve B.Sounds, Abd Soft, No tenderness, No organomegaly appriciated, No rebound - guarding or rigidity. No Cyanosis, Clubbing or edema, No new Rash or bruise    Data Review:    CBC Recent Labs  Lab 03/28/18 1656 03/29/18 1054 03/30/18 0743 03/31/18 0259  WBC 8.3 12.5* 7.6 5.6  HGB 10.6* 10.4* 10.9* 9.0*  HCT 33.3* 33.1* 34.9* 28.5*  PLT 120* 123* 126* 120*  MCV 92.8 93.8 93.1 92.8  MCH 29.5 29.5 29.1 29.3  MCHC 31.8 31.4 31.2 31.6  RDW 13.1 13.2 13.2 13.2  LYMPHSABS  --  1.0  --   --   MONOABS  --  1.2*  --   --   EOSABS  --  0.0  --   --   BASOSABS  --  0.0  --   --     Chemistries  Recent Labs  Lab 03/28/18 1656 03/29/18 1054 03/30/18 0743 03/31/18 0259  NA 140 139 141 141  K 4.4 4.1 3.8 4.0  CL 107 107 108 111  CO2 22 24 24 25   GLUCOSE 119* 159* 107* 102*  BUN 34* 35* 29* 29*  CREATININE 2.03* 2.30* 2.18* 2.17*  CALCIUM 8.9 8.8* 8.9 8.5*  MG  --   --   --  2.1  AST 20 21  --   --   ALT 14 13  --   --   ALKPHOS 69 73  --   --   BILITOT 0.7 0.8  --   --    ------------------------------------------------------------------------------------------------------------------ No results for input(s): CHOL, HDL, LDLCALC, TRIG, CHOLHDL, LDLDIRECT in the last 72 hours.  Lab Results  Component Value Date   HGBA1C 5.5 06/06/2016   ------------------------------------------------------------------------------------------------------------------ No results for input(s): TSH, T4TOTAL, T3FREE, THYROIDAB in the last 72 hours.  Invalid input(s): FREET3 ------------------------------------------------------------------------------------------------------------------ No results for input(s): VITAMINB12, FOLATE, FERRITIN, TIBC, IRON, RETICCTPCT in the last 72 hours.  Coagulation profile No results for input(s): INR, PROTIME in the last 168 hours.  No results for input(s): DDIMER in the  last 72 hours.  Cardiac Enzymes No results for input(s): CKMB, TROPONINI, MYOGLOBIN in the last 168 hours.  Invalid input(s): CK ------------------------------------------------------------------------------------------------------------------    Component Value Date/Time   BNP 219.3 (H) 11/07/2017 6962    Micro Results Recent Results (from the past 240 hour(s))  Blood culture (routine x 2)     Status: Abnormal   Collection Time: 03/28/18  4:56 PM  Result Value Ref Range Status   Specimen Description   Final    BLOOD RIGHT ARM Performed at [facility] [address] [place], [place] [address]    Special Requests   Final    BAA BCHV Performed at [facility], [place] [address], [place],  [address]    Culture  Setup Time   Final    GRAM POSITIVE COCCI IN BOTH AEROBIC AND ANAEROBIC BOTTLES CRITICAL VALUE NOTED.  VALUE IS CONSISTENT WITH PREVIOUSLY REPORTED AND CALLED VALUE.    Culture (A)  Final  ENTEROCOCCUS FAECALIS SUSCEPTIBILITIES PERFORMED ON PREVIOUS CULTURE WITHIN THE LAST 5 DAYS. Performed at [facility], [place] [address], [place], [place] [address]    Report Status 03/31/2018 FINAL  Final  Blood culture (routine x 2)     Status: Abnormal   Collection Time: 03/28/18  5:20 PM  Result Value Ref Range Status   Specimen Description   Final    BLOOD LEFT ARM Performed at [facility] [address], [place], [place] [address]    Special Requests   Final    BAA BCHV Performed at [facility], [place] [address], [place], [place] [address]    Culture  Setup Time   Final    GRAM POSITIVE COCCI IN BOTH AEROBIC AND ANAEROBIC BOTTLES CRITICAL RESULT CALLED TO, READ BACK BY AND VERIFIED WITH: RN A HARTLEY [id number] MLM Performed at [facility], [address], [place], [place] [address]    Culture ENTEROCOCCUS FAECALIS (A)  Final   Report Status 03/31/2018 FINAL  Final    Organism ID, Bacteria ENTEROCOCCUS FAECALIS  Final      Susceptibility   Enterococcus faecalis - MIC*    AMPICILLIN <=2 SENSITIVE Sensitive     VANCOMYCIN 2 SENSITIVE Sensitive     GENTAMICIN SYNERGY SENSITIVE Sensitive     * ENTEROCOCCUS FAECALIS  Blood Culture ID Panel (Reflexed)     Status: Abnormal   Collection Time: 03/28/18  5:20 PM  Result Value Ref Range Status   Enterococcus species DETECTED (A) NOT DETECTED Final    Comment: CRITICAL RESULT CALLED TO, READ BACK BY AND VERIFIED WITH: RN A HARTLEY [id number] MLM    Vancomycin resistance NOT DETECTED NOT DETECTED Final   Listeria monocytogenes NOT DETECTED NOT DETECTED Final   Staphylococcus species NOT DETECTED NOT DETECTED Final   Staphylococcus aureus (BCID) NOT DETECTED NOT DETECTED Final   Streptococcus species NOT DETECTED NOT DETECTED Final   Streptococcus agalactiae NOT DETECTED NOT DETECTED Final   Streptococcus pneumoniae NOT DETECTED NOT DETECTED Final   Streptococcus pyogenes NOT DETECTED NOT DETECTED Final   Acinetobacter baumannii NOT DETECTED NOT DETECTED Final   Enterobacteriaceae species NOT DETECTED NOT DETECTED Final   Enterobacter cloacae complex NOT DETECTED NOT DETECTED Final   Escherichia coli NOT DETECTED NOT DETECTED Final   Klebsiella oxytoca NOT DETECTED NOT DETECTED Final   Klebsiella pneumoniae NOT DETECTED NOT DETECTED Final   Proteus species NOT DETECTED NOT DETECTED Final   Serratia marcescens NOT DETECTED NOT DETECTED Final   Haemophilus influenzae NOT DETECTED NOT DETECTED Final   Neisseria meningitidis NOT DETECTED NOT DETECTED Final   Pseudomonas aeruginosa NOT DETECTED NOT DETECTED Final   Candida albicans NOT DETECTED NOT DETECTED Final   Candida glabrata NOT DETECTED NOT DETECTED Final   Candida krusei NOT DETECTED NOT DETECTED Final   Candida parapsilosis NOT DETECTED NOT DETECTED Final   Candida tropicalis NOT DETECTED NOT DETECTED Final    Comment: Performed at [facility], [place]. [address] [place],  [address]  Culture, blood (routine x 2)     Status: None (Preliminary result)   Collection Time: 03/29/18  1:49 PM  Result Value Ref Range Status   Specimen Description BLOOD LEFT ANTECUBITAL  Final   Special Requests   Final    BOTTLES DRAWN AEROBIC AND ANAEROBIC Blood Culture adequate volume   Culture  Setup Time   Final    GRAM POSITIVE COCCI IN BOTH AEROBIC AND ANAEROBIC  BOTTLES CRITICAL RESULT CALLED TO, READ BACK BY AND VERIFIED WITH: [place] [phone] FCP Performed at [facility], [place] [address] [place], [place] [address]    Culture GRAM POSITIVE COCCI  Final   Report Status PENDING  Incomplete  Blood Culture ID Panel (Reflexed)     Status: Abnormal   Collection Time: 03/29/18  1:49 PM  Result Value Ref Range Status   Enterococcus species DETECTED (A) NOT DETECTED Final    Comment: CRITICAL RESULT CALLED TO, READ BACK BY AND VERIFIED WITH: PHARMD JAMES L. [phone]    Vancomycin resistance NOT DETECTED NOT DETECTED Final   Listeria monocytogenes NOT DETECTED NOT DETECTED Final   Staphylococcus species NOT DETECTED NOT DETECTED Final   Staphylococcus aureus (BCID) NOT DETECTED NOT DETECTED Final   Streptococcus species NOT DETECTED NOT DETECTED Final   Streptococcus agalactiae NOT DETECTED NOT DETECTED Final   Streptococcus pneumoniae NOT DETECTED NOT DETECTED Final   Streptococcus pyogenes NOT DETECTED NOT DETECTED Final   Acinetobacter baumannii NOT DETECTED NOT DETECTED Final   Enterobacteriaceae species NOT DETECTED NOT DETECTED Final   Enterobacter cloacae complex NOT DETECTED NOT DETECTED Final   Escherichia coli NOT DETECTED NOT DETECTED Final   Klebsiella oxytoca NOT DETECTED NOT DETECTED Final   Klebsiella pneumoniae NOT DETECTED NOT DETECTED Final   Proteus species NOT DETECTED NOT DETECTED Final   Serratia marcescens NOT DETECTED NOT DETECTED Final   Haemophilus influenzae NOT DETECTED NOT DETECTED  Final   Neisseria meningitidis NOT DETECTED NOT DETECTED Final   Pseudomonas aeruginosa NOT DETECTED NOT DETECTED Final   Candida albicans NOT DETECTED NOT DETECTED Final   Candida glabrata NOT DETECTED NOT DETECTED Final   Candida krusei NOT DETECTED NOT DETECTED Final   Candida parapsilosis NOT DETECTED NOT DETECTED Final   Candida tropicalis NOT DETECTED NOT DETECTED Final    Comment: Performed at [facility], [place]. [address], [place], [place] [address]  Culture, blood (routine x 2)     Status: None (Preliminary result)   Collection Time: 03/29/18  1:54 PM  Result Value Ref Range Status   Specimen Description BLOOD LEFT ANTECUBITAL  Final   Special Requests   Final    BOTTLES DRAWN AEROBIC AND ANAEROBIC Blood Culture adequate volume   Culture   Final    NO GROWTH < 24 HOURS Performed at [facility], [place] [address], [place], [place] [address]    Report Status PENDING  Incomplete    Radiology Reports Dg Chest 2 View  Result Date: 03/28/2018 CLINICAL DATA:  Fever 2 days ago returning today. Shortness of breath on exertion. Generalized weakness. EXAM: CHEST - 2 VIEW COMPARISON:  01/15/2018 FINDINGS: Status post endovascular aortic valve replacement. Heart is top-normal in size. No mediastinal or hilar masses. No evidence of adenopathy. Clear lungs.  No pleural effusion or pneumothorax. Stable left anterior chest wall sequential pacemaker. Skeletal structures are demineralized but intact. IMPRESSION: No acute cardiopulmonary disease. Electronically Signed   By: Lajean Manes M.D.   On: 03/28/2018 17:43    Time Spent in minutes  30   Lala Lund M.D on 03/31/2018 at 9:56 AM  To page go to [email or web address] - password [facility]

## 2018-03-31 NOTE — Evaluation (Signed)
Physical Therapy Evaluation Patient Details Name: Ryan [name] MRN: [id number] DOB: 1930-03-03 Today's Date: 03/31/2018   History of Present Illness  84yo male c/o temperature of 103 degrees F, dizziness, weakness, being shaky. Had positive cultures at [facility]. Admitted to [facility] for bacteremia. 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 Able to complete all functional transfers with Mod(I) and ambulated approximately 297f with SPC and distant S, no significant gait deviations or balance impairments noted. Patient overall with good safety awareness and excellent mobility. Educated that he is safe to walk in the hallway with staff or family present. He was left up in the chair with all needs met and all questions/concerns addressed this morning. He does not appear to be in need of skilled PT services in the acute setting or following discharge. PT signing off for now- thank you for the referral.     Follow Up Recommendations No PT follow up    Equipment Recommendations  None recommended by PT    Recommendations for Other Services       Precautions / Restrictions Precautions Precautions: None Restrictions Weight Bearing Restrictions: No      Mobility  Bed Mobility               General bed mobility comments: OOB in chair   Transfers Overall transfer level: Modified independent               General transfer comment: no physical assist given, good safety awareness   Ambulation/Gait Ambulation/Gait assistance: Supervision Gait Distance (Feet): 200 Feet Assistive device: Straight cane Gait Pattern/deviations: Step-through pattern;Decreased step length - left;Decreased step length - right;Decreased stride length Gait velocity: decreased    General Gait Details: gait  mechanics generally WNL, no significant unsteadiness or LOB noted, good safety awareness   Stairs            Wheelchair Mobility    Modified Rankin (Stroke Patients Only)       Balance Overall balance assessment: No apparent balance deficits (not formally assessed)                                           Pertinent Vitals/Pain Pain Assessment: No/denies pain    Home Living Family/patient expects to be discharged to:: Private residence Living Arrangements: Spouse/significant other Available Help at Discharge: Family;Available 24 hours/day Type of Home: House Home Access: Stairs to enter Entrance Stairs-Rails: Can reach both Entrance Stairs-Number of Steps: 3 Home Layout: One level Home Equipment: Walker - 2 wheels;Cane - single point;Shower seat      Prior Function Level of Independence: Independent with assistive device(s)         Comments: uses SPC indoors, RW when out of home; very active and rows/rides seated bike      Hand Dominance        Extremity/Trunk Assessment   Upper Extremity Assessment Upper Extremity Assessment: Overall WFL for tasks assessed    Lower Extremity Assessment Lower Extremity Assessment: Overall WFL for tasks assessed    Cervical / Trunk Assessment Cervical / Trunk Assessment: Normal  Communication   Communication: No difficulties  Cognition Arousal/Alertness: Awake/alert Behavior During Therapy: WFL for tasks assessed/performed Overall Cognitive Status: Within  Functional Limits for tasks assessed                                        General Comments      Exercises     Assessment/Plan    PT Assessment Patent does not need any further PT services  PT Problem List Decreased activity tolerance       PT Treatment Interventions Therapeutic exercise    PT Goals (Current goals can be found in the Care Plan section)  Acute Rehab PT Goals Patient Stated Goal: to go home, get  back to normal routine  PT Goal Formulation: With patient Time For Goal Achievement: 04/14/18 Potential to Achieve Goals: Good    Frequency Other (Comment)(eval only )   Barriers to discharge        Co-evaluation               AM-PAC PT "6 Clicks" Daily Activity  Outcome Measure Difficulty turning over in bed (including adjusting bedclothes, sheets and blankets)?: None Difficulty moving from lying on back to sitting on the side of the bed? : None Difficulty sitting down on and standing up from a chair with arms (e.g., wheelchair, bedside commode, etc,.)?: None Help needed moving to and from a bed to chair (including a wheelchair)?: None Help needed walking in hospital room?: None Help needed climbing 3-5 steps with a railing? : A Little 6 Click Score: 23    End of Session   Activity Tolerance: Patient tolerated treatment well Patient left: in chair;with call bell/phone within reach   PT Visit Diagnosis: Difficulty in walking, not elsewhere classified (R26.2)    Time: 9704-4925 PT Time Calculation (min) (ACUTE ONLY): 18 min   Charges:   PT Evaluation $PT Eval Low Complexity: 1 Low         Deniece Ree PT, DPT, CBIS  Supplemental Physical Therapist [place]    Pager [phone] Acute Rehab Office [phone]

## 2018-03-31 NOTE — Care Management Note (Addendum)
Case Management Note  Patient Details  Name: Ryan [name] MRN: [id number] Date of Birth: [birth year]  Subjective/Objective:    AV Valve Endocarditis,  CHF, afib              Action/Plan: Spoke to pt and wife at bedside. States he was at [facility] in the past and wants to go home with [facility]. Offered choice for HH/list provided. Wife states she or her daughter will be available to administer IV abx. Requested NCM contact son in law, Dr Wilfrid Lund # [phone]. Spoke to Dr Loletha Carrow, he agreeable to [facility] for [facility] RN for IV abx. Contacted [facility] with new referral and request for Infusion Coordinator to contact son. Son in law would like for [facility] Infusion Coordinator to contact him to discuss home IV abx. Will need HHRN orders with F2F and OPAT orders. Has RW and 3n1 bedside commode at home.   Expected Discharge Date:               Expected Discharge Plan:  [place]  In-House Referral:  NA  Discharge planning Services  CM Consult  Post Acute Care Choice:  Home Health Choice offered to:  Patient, Spouse  DME Arranged:  N/A DME Agency:  NA  HH Arranged:  RN, IV Antibiotics HH Agency:  [facility]  Status of Service:  In process, will continue to follow  If discussed at Long Length of Stay Meetings, dates discussed:    Additional Comments:  Erenest Rasher, RN 03/31/2018, 1:44 PM

## 2018-03-31 NOTE — Progress Notes (Addendum)
[facility]  Date of Admission:  03/29/2018   Total days of antibiotics 3          ASSESSMENT: 84 y/o male with TAVR valve, PPM/ICD and recurrent enterococcus faecalis bacteremia. Initially in 10/2017 verified vegetations on ICD leads. Repeat TEE on 12/15/17 showed the vegetations had resolved. TEE today also without mention of vegetations on leads of valve. Would re-treat with dual therapy with 6 weeks for PVE/PPM infection with chronic suppression thereafter.   PLAN: 1. Follow micro  2. Continue ceftriaxone and ampicillin x 6 weeks for PVE 3. Hold on PICC line for now  Principal Problem:   Bacteremia due to Enterococcus Active Problems:   PAF (paroxysmal atrial fibrillation) (HCC)   Chronic combined systolic (congestive) and diastolic (congestive) heart failure (HCC)   Hypertension   Prosthetic valve endocarditis (HCC)   S/P AVR   Acute renal insufficiency   Presence of permanent cardiac pacemaker   Scheduled Meds: . amLODipine  5 mg Oral Daily  . apixaban  2.5 mg Oral BID  . atorvastatin  40 mg Oral q1800  . docusate sodium  100 mg Oral BID  . isosorbide mononitrate  60 mg Oral Daily  . latanoprost  1 drop Both Eyes QHS  . levothyroxine  12.5 mcg Oral QAC breakfast  . lidocaine  1 patch Transdermal Daily  . metoprolol succinate  100 mg Oral Daily  . mometasone-formoterol  2 puff Inhalation Daily  . pantoprazole  20 mg Oral Daily   Continuous Infusions: . sodium chloride Stopped (03/30/18 2303)  . sodium chloride 10 mL/hr at 03/31/18 0621  . ampicillin (OMNIPEN) IV 2 g (03/31/18 0737)  . cefTRIAXone (ROCEPHIN)  IV Stopped (03/30/18 2251)   PRN Meds:.sodium chloride, sodium chloride, acetaminophen **OR** acetaminophen, acetaminophen-codeine, albuterol, ondansetron **OR** ondansetron (ZOFRAN) IV, polyethylene glycol, senna   SUBJECTIVE: Reports he feels great, eating and drinking well, walking around. Denies fevers, reports some  chills. No n/v/d. No sob or chest pain. Feels great and is excited to go home after all of this is done.   Review of Systems: Review of Systems  Constitutional: Positive for chills. Negative for fever.  Cardiovascular: Negative for chest pain and leg swelling.  Gastrointestinal: Negative for abdominal pain, diarrhea, nausea and vomiting.  Skin: Negative for rash.    Allergies  Allergen Reactions  . Ciprofloxacin Anaphylaxis  . Hydrochlorothiazide Anaphylaxis  . Sulfa Antibiotics Anaphylaxis  . Ace Inhibitors Cough  . Losartan Cough  . Carvedilol Cough    Pt states it "causes him too cough."  . Lisinopril Cough    Pt reports worsening cough and "feeling wheezy."  . Soy Allergy Nausea And Vomiting    OBJECTIVE: Vitals:   03/30/18 1651 03/30/18 2107 03/31/18 0549 03/31/18 0820  BP: (!) 153/61 (!) 104/54 (!) 111/54   Pulse: 63 61 64   Resp:  12 12   Temp:  98 F (36.7 C) 98.2 F (36.8 C)   TempSrc:  Oral Oral   SpO2: 97% 95% 96% 97%   There is no height or weight on file to calculate BMI.  Physical Exam  Constitutional: He is oriented to person, place, and time. He appears well-developed.  HENT:  Head: Normocephalic.  Eyes: EOM are normal.  Neck: Normal range of motion.  Cardiovascular: Normal rate, regular rhythm and normal heart sounds.  Pulmonary/Chest: Effort normal and breath sounds normal.  Abdominal: Soft.  Musculoskeletal: Normal range of motion.  Neurological:  He is alert and oriented to person, place, and time.  Skin: Skin is warm and dry.    Lab Results Lab Results  Component Value Date   WBC 5.6 03/31/2018   HGB 9.0 (L) 03/31/2018   HCT 28.5 (L) 03/31/2018   MCV 92.8 03/31/2018   PLT 120 (L) 03/31/2018    Lab Results  Component Value Date   CREATININE 2.17 (H) 03/31/2018   BUN 29 (H) 03/31/2018   NA 141 03/31/2018   K 4.0 03/31/2018   CL 111 03/31/2018   CO2 25 03/31/2018    Lab Results  Component Value Date   ALT 13 03/29/2018   AST 21  03/29/2018   ALKPHOS 73 03/29/2018   BILITOT 0.8 03/29/2018     Microbiology: Recent Results (from the past 240 hour(s))  Blood culture (routine x 2)     Status: Abnormal   Collection Time: 03/28/18  4:56 PM  Result Value Ref Range Status   Specimen Description   Final    BLOOD RIGHT ARM Performed at [facility] [address], [place], [place] [address]    Special Requests   Final    BAA BCHV Performed at [facility], [place] [address], [place], [place] [address]    Culture  Setup Time   Final    GRAM POSITIVE COCCI IN BOTH AEROBIC AND ANAEROBIC BOTTLES CRITICAL VALUE NOTED.  VALUE IS CONSISTENT WITH PREVIOUSLY REPORTED AND CALLED VALUE.    Culture (A)  Final    ENTEROCOCCUS FAECALIS SUSCEPTIBILITIES PERFORMED ON PREVIOUS CULTURE WITHIN THE LAST 5 DAYS. Performed at [facility], [place] [address] [place], [place] [address]    Report Status 03/31/2018 FINAL  Final  Blood culture (routine x 2)     Status: Abnormal   Collection Time: 03/28/18  5:20 PM  Result Value Ref Range Status   Specimen Description   Final    BLOOD LEFT ARM Performed at [facility] [address], [place], [place] [address]    Special Requests   Final    BAA BCHV Performed at [facility], [place] [address], [place], [place] [address]    Culture  Setup Time   Final    GRAM POSITIVE COCCI IN BOTH AEROBIC AND ANAEROBIC BOTTLES CRITICAL RESULT CALLED TO, READ BACK BY AND VERIFIED WITH: RN A HARTLEY [id number] MLM Performed at [facility], [address] [place], [place] [address]    Culture ENTEROCOCCUS FAECALIS (A)  Final   Report Status 03/31/2018 FINAL  Final   Organism ID, Bacteria ENTEROCOCCUS FAECALIS  Final      Susceptibility   Enterococcus faecalis - MIC*    AMPICILLIN <=2 SENSITIVE Sensitive     VANCOMYCIN 2 SENSITIVE Sensitive     GENTAMICIN SYNERGY SENSITIVE Sensitive     * ENTEROCOCCUS FAECALIS    Blood Culture ID Panel (Reflexed)     Status: Abnormal   Collection Time: 03/28/18  5:20 PM  Result Value Ref Range Status   Enterococcus species DETECTED (A) NOT DETECTED Final    Comment: CRITICAL RESULT CALLED TO, READ BACK BY AND VERIFIED WITH: RN A HARTLEY [id number] MLM    Vancomycin resistance NOT DETECTED NOT DETECTED Final   Listeria monocytogenes NOT DETECTED NOT DETECTED Final   Staphylococcus species NOT DETECTED NOT DETECTED Final   Staphylococcus aureus (BCID) NOT DETECTED NOT DETECTED Final   Streptococcus species NOT DETECTED NOT DETECTED Final   Streptococcus  agalactiae NOT DETECTED NOT DETECTED Final   Streptococcus pneumoniae NOT DETECTED NOT DETECTED Final   Streptococcus pyogenes NOT DETECTED NOT DETECTED Final   Acinetobacter baumannii NOT DETECTED NOT DETECTED Final   Enterobacteriaceae species NOT DETECTED NOT DETECTED Final   Enterobacter cloacae complex NOT DETECTED NOT DETECTED Final   Escherichia coli NOT DETECTED NOT DETECTED Final   Klebsiella oxytoca NOT DETECTED NOT DETECTED Final   Klebsiella pneumoniae NOT DETECTED NOT DETECTED Final   Proteus species NOT DETECTED NOT DETECTED Final   Serratia marcescens NOT DETECTED NOT DETECTED Final   Haemophilus influenzae NOT DETECTED NOT DETECTED Final   Neisseria meningitidis NOT DETECTED NOT DETECTED Final   Pseudomonas aeruginosa NOT DETECTED NOT DETECTED Final   Candida albicans NOT DETECTED NOT DETECTED Final   Candida glabrata NOT DETECTED NOT DETECTED Final   Candida krusei NOT DETECTED NOT DETECTED Final   Candida parapsilosis NOT DETECTED NOT DETECTED Final   Candida tropicalis NOT DETECTED NOT DETECTED Final    Comment: Performed at [facility], [place] [address], [place], [place] [address]  Culture, blood (routine x 2)     Status: None (Preliminary result)   Collection Time: 03/29/18  1:49 PM  Result Value Ref Range Status   Specimen Description BLOOD LEFT ANTECUBITAL  Final   Special  Requests   Final    BOTTLES DRAWN AEROBIC AND ANAEROBIC Blood Culture adequate volume   Culture  Setup Time   Final    GRAM POSITIVE COCCI IN BOTH AEROBIC AND ANAEROBIC BOTTLES CRITICAL RESULT CALLED TO, READ BACK BY AND VERIFIED WITH: [place] [id number] FCP Performed at [facility], [place] [address], [place], [place] [address]    Culture GRAM POSITIVE COCCI  Final   Report Status PENDING  Incomplete  Blood Culture ID Panel (Reflexed)     Status: Abnormal   Collection Time: 03/29/18  1:49 PM  Result Value Ref Range Status   Enterococcus species DETECTED (A) NOT DETECTED Final    Comment: CRITICAL RESULT CALLED TO, READ BACK BY AND VERIFIED WITH: PHARMD JAMES L. [phone]    Vancomycin resistance NOT DETECTED NOT DETECTED Final   Listeria monocytogenes NOT DETECTED NOT DETECTED Final   Staphylococcus species NOT DETECTED NOT DETECTED Final   Staphylococcus aureus (BCID) NOT DETECTED NOT DETECTED Final   Streptococcus species NOT DETECTED NOT DETECTED Final   Streptococcus agalactiae NOT DETECTED NOT DETECTED Final   Streptococcus pneumoniae NOT DETECTED NOT DETECTED Final   Streptococcus pyogenes NOT DETECTED NOT DETECTED Final   Acinetobacter baumannii NOT DETECTED NOT DETECTED Final   Enterobacteriaceae species NOT DETECTED NOT DETECTED Final   Enterobacter cloacae complex NOT DETECTED NOT DETECTED Final   Escherichia coli NOT DETECTED NOT DETECTED Final   Klebsiella oxytoca NOT DETECTED NOT DETECTED Final   Klebsiella pneumoniae NOT DETECTED NOT DETECTED Final   Proteus species NOT DETECTED NOT DETECTED Final   Serratia marcescens NOT DETECTED NOT DETECTED Final   Haemophilus influenzae NOT DETECTED NOT DETECTED Final   Neisseria meningitidis NOT DETECTED NOT DETECTED Final   Pseudomonas aeruginosa NOT DETECTED NOT DETECTED Final   Candida albicans NOT DETECTED NOT DETECTED Final   Candida glabrata NOT DETECTED NOT DETECTED Final   Candida krusei NOT DETECTED NOT  DETECTED Final   Candida parapsilosis NOT DETECTED NOT DETECTED Final   Candida tropicalis NOT DETECTED NOT DETECTED Final    Comment: Performed at [facility], [place]. [address] [place], [place] [address]  Culture, blood (routine x 2)     Status: None (Preliminary result)   Collection Time: 03/29/18  1:54 PM  Result Value Ref Range Status   Specimen Description BLOOD LEFT ANTECUBITAL  Final   Special Requests   Final    BOTTLES DRAWN AEROBIC AND ANAEROBIC Blood Culture adequate volume   Culture   Final    NO GROWTH < 24 HOURS Performed at [facility], [address], [place], [place] [address]    Report Status PENDING  Incomplete    April  A Peterson, [place] for [facility] [phone] pager   [phone] cell 03/31/2018, 9:50 AM

## 2018-04-01 ENCOUNTER — Inpatient Hospital Stay: Payer: Self-pay

## 2018-04-01 DIAGNOSIS — R5381 Other malaise: Secondary | ICD-10-CM

## 2018-04-01 DIAGNOSIS — R5383 Other fatigue: Secondary | ICD-10-CM

## 2018-04-01 DIAGNOSIS — Z8679 Personal history of other diseases of the circulatory system: Secondary | ICD-10-CM

## 2018-04-01 LAB — BASIC METABOLIC PANEL
Anion gap: 8 (ref 5–15)
BUN: 32 mg/dL — AB (ref 8–23)
CALCIUM: 9 mg/dL (ref 8.9–10.3)
CHLORIDE: 110 mmol/L (ref 98–111)
CO2: 23 mmol/L (ref 22–32)
CREATININE: 2.43 mg/dL — AB (ref 0.61–1.24)
GFR, EST AFRICAN AMERICAN: 26 mL/min — AB (ref >60)
GFR, EST NON AFRICAN AMERICAN: 23 mL/min — AB (ref >60)
Glucose, Bld: 99 mg/dL (ref 70–99)
Potassium: 3.8 mmol/L (ref 3.5–5.1)
SODIUM: 141 mmol/L (ref 135–145)

## 2018-04-01 LAB — CULTURE, BLOOD (ROUTINE X 2): SPECIAL REQUESTS: ADEQUATE

## 2018-04-01 LAB — CBC
HCT: 33 % — ABNORMAL LOW (ref 39.0–52.0)
Hemoglobin: 10.4 g/dL — ABNORMAL LOW (ref 13.0–17.0)
MCH: 29.2 pg (ref 26.0–34.0)
MCHC: 31.5 g/dL (ref 30.0–36.0)
MCV: 92.7 fL (ref 80.0–100.0)
PLATELETS: 164 10*3/uL (ref 150–400)
RBC: 3.56 MIL/uL — ABNORMAL LOW (ref 4.22–5.81)
RDW: 13 % (ref 11.5–15.5)
WBC: 6.9 10*3/uL (ref 4.0–10.5)
nRBC: 0 % (ref 0.0–0.2)

## 2018-04-01 MED ORDER — SODIUM CHLORIDE 0.9% FLUSH
10.0000 mL | Freq: Two times a day (BID) | INTRAVENOUS | Status: DC
  Administered 2018-04-01 – 2018-04-02 (×2): 10 mL

## 2018-04-01 MED ORDER — SODIUM CHLORIDE 0.9% FLUSH: 10.0000 mL | INTRAVENOUS | Status: DC | PRN

## 2018-04-01 NOTE — Progress Notes (Signed)
PHARMACY CONSULT NOTE FOR:  OUTPATIENT  PARENTERAL ANTIBIOTIC THERAPY (OPAT)  Indication: PVE/PPM infection  Regimen:  1. Ampicillin 2 gm every 8 hours 2. Ceftriaxone 2 gm every 12 hours   End date: May 10, 2018  IV antibiotic discharge orders are pended. To discharging provider:  please sign these orders via discharge navigator,  Select New Orders & click on the button choice - Manage This Unsigned Work.     Thank you for allowing [place] to participate in this patients care.   Jens Som, PharmD Please utilize [facility] (under [facility]) for appropriate number for your unit pharmacist. 04/01/2018 4:25 PM

## 2018-04-01 NOTE — Progress Notes (Signed)
Spoke to Dr. Candiss Norse concerning patient's creatine clearance of 18 to ;be sure ok to place PICC. He said yes if they can use the same arm.

## 2018-04-01 NOTE — Progress Notes (Signed)
[facility] for Infectious Disease  Date of Admission:  03/29/2018   Total days of antibiotics 4          Patient ID: Ryan [name] is a 84 y.o. M with  Principal Problem:   Bacteremia due to Enterococcus Active Problems:   PAF (paroxysmal atrial fibrillation) (HCC)   Chronic combined systolic (congestive) and diastolic (congestive) heart failure (HCC)   Hypertension   Prosthetic valve endocarditis (HCC)   S/P AVR   Acute renal insufficiency   Presence of permanent cardiac pacemaker   Scheduled Meds: . amLODipine  5 mg Oral Daily  . apixaban  2.5 mg Oral BID  . atorvastatin  40 mg Oral q1800  . docusate sodium  100 mg Oral BID  . isosorbide mononitrate  60 mg Oral Daily  . latanoprost  1 drop Both Eyes QHS  . levothyroxine  12.5 mcg Oral QAC breakfast  . lidocaine  1 patch Transdermal Daily  . metoprolol succinate  100 mg Oral Daily  . mometasone-formoterol  2 puff Inhalation Daily  . pantoprazole  20 mg Oral Daily   Continuous Infusions: . sodium chloride Stopped (03/31/18 2327)  . sodium chloride Stopped (04/01/18 3846)  . ampicillin (OMNIPEN) IV 2 g (04/01/18 1503)  . cefTRIAXone (ROCEPHIN)  IV 2 g (04/01/18 0835)   PRN Meds:.sodium chloride, sodium chloride, acetaminophen **OR** acetaminophen, acetaminophen-codeine, albuterol, ondansetron **OR** ondansetron (ZOFRAN) IV, polyethylene glycol, senna   SUBJECTIVE: Pt reports he feels great but is somewhat sleepy. He had issues with his IV yesterday and it sprayed on him so he didn't sleep well. He remains in good spirits and is eager to go home. Denies fevers/chills, no n/v/d. Tolerating PO well.   Review of Systems  Constitutional: Positive for malaise/fatigue. Negative for chills and fever.  Cardiovascular: Negative for chest pain and leg swelling.  Gastrointestinal: Negative for abdominal pain, diarrhea, nausea and vomiting.  Skin: Negative for rash.    Allergies  Allergen Reactions  .  Ciprofloxacin Anaphylaxis  . Hydrochlorothiazide Anaphylaxis  . Sulfa Antibiotics Anaphylaxis  . Ace Inhibitors Cough  . Losartan Cough  . Carvedilol Cough    Pt states it "causes him too cough."  . Lisinopril Cough    Pt reports worsening cough and "feeling wheezy."  . Soy Allergy Nausea And Vomiting    OBJECTIVE: Vitals:   04/01/18 0550 04/01/18 0803 04/01/18 0826 04/01/18 0827  BP: 136/61  131/60   Pulse: 66   62  Resp:      Temp: 97.7 F (36.5 C)     TempSrc:      SpO2: 95% 97%     There is no height or weight on file to calculate BMI.  Physical Exam  Constitutional: He is oriented to person, place, and time. He appears well-developed.  HENT:  Head: Normocephalic.  Eyes: EOM are normal.  Neck: Normal range of motion.  Cardiovascular: Normal rate, regular rhythm and normal heart sounds.  Pulmonary/Chest: Effort normal and breath sounds normal.  Abdominal: Soft.  Musculoskeletal: Normal range of motion.  Neurological: He is alert and oriented to person, place, and time.  Skin: Skin is warm and dry.    Lab Results Lab Results  Component Value Date   WBC 6.9 04/01/2018   HGB 10.4 (L) 04/01/2018   HCT 33.0 (L) 04/01/2018   MCV 92.7 04/01/2018   PLT 164 04/01/2018    Lab Results  Component Value Date   CREATININE 2.43 (H)  04/01/2018   BUN 32 (H) 04/01/2018   NA 141 04/01/2018   K 3.8 04/01/2018   CL 110 04/01/2018   CO2 23 04/01/2018    Lab Results  Component Value Date   ALT 13 03/29/2018   AST 21 03/29/2018   ALKPHOS 73 03/29/2018   BILITOT 0.8 03/29/2018     Microbiology: Recent Results (from the past 240 hour(s))  Blood culture (routine x 2)     Status: Abnormal   Collection Time: 03/28/18  4:56 PM  Result Value Ref Range Status   Specimen Description   Final    BLOOD RIGHT ARM Performed at [facility] [address], [place], [place] [address]    Special Requests   Final    BAA BCHV Performed at [facility], [place] [address] [place], [place] [address]    Culture  Setup Time   Final    GRAM POSITIVE COCCI IN BOTH AEROBIC AND ANAEROBIC BOTTLES CRITICAL VALUE NOTED.  VALUE IS CONSISTENT WITH PREVIOUSLY REPORTED AND CALLED VALUE.    Culture (A)  Final    ENTEROCOCCUS FAECALIS SUSCEPTIBILITIES PERFORMED ON PREVIOUS CULTURE WITHIN THE LAST 5 DAYS. Performed at [facility], [place] [address] [place], [place] [address]    Report Status 03/31/2018 FINAL  Final  Blood culture (routine x 2)     Status: Abnormal   Collection Time: 03/28/18  5:20 PM  Result Value Ref Range Status   Specimen Description   Final    BLOOD LEFT ARM Performed at [facility] [address], [place], [place] [address]    Special Requests   Final    BAA BCHV Performed at [facility], [place] [address] [place], [place] [address]    Culture  Setup Time   Final    GRAM POSITIVE COCCI IN BOTH AEROBIC AND ANAEROBIC BOTTLES CRITICAL RESULT CALLED TO, READ BACK BY AND VERIFIED WITH: RN A HARTLEY [id number] MLM Performed at [facility], [address] [place],  [address]    Culture ENTEROCOCCUS FAECALIS (A)  Final   Report Status 03/31/2018 FINAL  Final   Organism ID, Bacteria ENTEROCOCCUS FAECALIS  Final      Susceptibility   Enterococcus faecalis - MIC*    AMPICILLIN <=2 SENSITIVE Sensitive     VANCOMYCIN 2 SENSITIVE Sensitive     GENTAMICIN SYNERGY SENSITIVE Sensitive     * ENTEROCOCCUS FAECALIS  Blood Culture ID Panel (Reflexed)     Status: Abnormal   Collection Time: 03/28/18  5:20 PM  Result Value Ref Range Status   Enterococcus species DETECTED (A) NOT DETECTED Final    Comment: CRITICAL RESULT CALLED TO, READ BACK BY AND VERIFIED WITH: RN A HARTLEY [phone] MLM    Vancomycin resistance NOT DETECTED NOT DETECTED Final   Listeria monocytogenes NOT DETECTED NOT DETECTED Final   Staphylococcus species NOT DETECTED NOT DETECTED Final    Staphylococcus aureus (BCID) NOT DETECTED NOT DETECTED Final   Streptococcus species NOT DETECTED NOT DETECTED Final   Streptococcus agalactiae NOT DETECTED NOT DETECTED Final   Streptococcus pneumoniae NOT DETECTED NOT DETECTED Final   Streptococcus pyogenes NOT DETECTED NOT DETECTED Final   Acinetobacter baumannii NOT DETECTED NOT DETECTED Final   Enterobacteriaceae species NOT DETECTED NOT DETECTED Final   Enterobacter cloacae complex NOT DETECTED NOT DETECTED Final   Escherichia coli NOT DETECTED NOT DETECTED Final   Klebsiella oxytoca NOT DETECTED NOT DETECTED Final  Klebsiella pneumoniae NOT DETECTED NOT DETECTED Final   Proteus species NOT DETECTED NOT DETECTED Final   Serratia marcescens NOT DETECTED NOT DETECTED Final   Haemophilus influenzae NOT DETECTED NOT DETECTED Final   Neisseria meningitidis NOT DETECTED NOT DETECTED Final   Pseudomonas aeruginosa NOT DETECTED NOT DETECTED Final   Candida albicans NOT DETECTED NOT DETECTED Final   Candida glabrata NOT DETECTED NOT DETECTED Final   Candida krusei NOT DETECTED NOT DETECTED Final   Candida parapsilosis NOT DETECTED NOT DETECTED Final   Candida tropicalis NOT DETECTED NOT DETECTED Final    Comment: Performed at [facility], [place] [address], [place], [place] [address]  Culture, blood (routine x 2)     Status: Abnormal   Collection Time: 03/29/18  1:49 PM  Result Value Ref Range Status   Specimen Description BLOOD LEFT ANTECUBITAL  Final   Special Requests   Final    BOTTLES DRAWN AEROBIC AND ANAEROBIC Blood Culture adequate volume   Culture  Setup Time   Final    GRAM POSITIVE COCCI IN BOTH AEROBIC AND ANAEROBIC BOTTLES CRITICAL RESULT CALLED TO, READ BACK BY AND VERIFIED WITH: PHARMD JAMES [id number] FCP    Culture (A)  Final    ENTEROCOCCUS FAECALIS SUSCEPTIBILITIES PERFORMED ON PREVIOUS CULTURE WITHIN THE LAST 5 DAYS. Performed at [facility], [place] [address] [place], [place] [address]    Report  Status 04/01/2018 FINAL  Final  Blood Culture ID Panel (Reflexed)     Status: Abnormal   Collection Time: 03/29/18  1:49 PM  Result Value Ref Range Status   Enterococcus species DETECTED (A) NOT DETECTED Final    Comment: CRITICAL RESULT CALLED TO, READ BACK BY AND VERIFIED WITH: PHARMD JAMES L. [id number] [id number]    Vancomycin resistance NOT DETECTED NOT DETECTED Final   Listeria monocytogenes NOT DETECTED NOT DETECTED Final   Staphylococcus species NOT DETECTED NOT DETECTED Final   Staphylococcus aureus (BCID) NOT DETECTED NOT DETECTED Final   Streptococcus species NOT DETECTED NOT DETECTED Final   Streptococcus agalactiae NOT DETECTED NOT DETECTED Final   Streptococcus pneumoniae NOT DETECTED NOT DETECTED Final   Streptococcus pyogenes NOT DETECTED NOT DETECTED Final   Acinetobacter baumannii NOT DETECTED NOT DETECTED Final   Enterobacteriaceae species NOT DETECTED NOT DETECTED Final   Enterobacter cloacae complex NOT DETECTED NOT DETECTED Final   Escherichia coli NOT DETECTED NOT DETECTED Final   Klebsiella oxytoca NOT DETECTED NOT DETECTED Final   Klebsiella pneumoniae NOT DETECTED NOT DETECTED Final   Proteus species NOT DETECTED NOT DETECTED Final   Serratia marcescens NOT DETECTED NOT DETECTED Final   Haemophilus influenzae NOT DETECTED NOT DETECTED Final   Neisseria meningitidis NOT DETECTED NOT DETECTED Final   Pseudomonas aeruginosa NOT DETECTED NOT DETECTED Final   Candida albicans NOT DETECTED NOT DETECTED Final   Candida glabrata NOT DETECTED NOT DETECTED Final   Candida krusei NOT DETECTED NOT DETECTED Final   Candida parapsilosis NOT DETECTED NOT DETECTED Final   Candida tropicalis NOT DETECTED NOT DETECTED Final    Comment: Performed at [facility], [place]. [address] [place], [place] [address]  Culture, blood (routine x 2)     Status: None (Preliminary result)   Collection Time: 03/29/18  1:54 PM  Result Value Ref Range Status   Specimen Description BLOOD LEFT  ANTECUBITAL  Final   Special Requests   Final    BOTTLES DRAWN AEROBIC AND ANAEROBIC Blood Culture adequate volume   Culture  Final    NO GROWTH 3 DAYS Performed at [facility], [place] [address] [place], [place] [address]    Report Status PENDING  Incomplete  Culture, blood (routine x 2)     Status: None (Preliminary result)   Collection Time: 03/30/18  7:48 AM  Result Value Ref Range Status   Specimen Description BLOOD RIGHT ANTECUBITAL  Final   Special Requests   Final    BOTTLES DRAWN AEROBIC AND ANAEROBIC Blood Culture adequate volume   Culture   Final    NO GROWTH 2 DAYS Performed at [facility], [place] [address] [place], [place] [address]    Report Status PENDING  Incomplete  Culture, blood (routine x 2)     Status: None (Preliminary result)   Collection Time: 03/30/18  7:51 AM  Result Value Ref Range Status   Specimen Description BLOOD LEFT ANTECUBITAL  Final   Special Requests   Final    BOTTLES DRAWN AEROBIC AND ANAEROBIC Blood Culture adequate volume   Culture   Final    NO GROWTH 2 DAYS Performed at [facility], [place] [address] [place], [place] [address]    Report Status PENDING  Incomplete   ASSESSMENT: 1. Enterococcus faecalis bacteremia, recurrent = known previous endocarditis involving PPD leads, no new vegetation on ICD lead or TAVR valve however considering his history he will require another 6 weeks of dual therapy with ampicillin + ceftriaxone. OPAT below.   2. Medication Monitoring = outlined in OPAT orders below   3. Access = OK to place double lumen PICC - will need OK fro primary team for peripheral access vs central/tunneled line with CrCl.    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 Ciprofloxacin Anaphylaxis  . Hydrochlorothiazide Anaphylaxis  . Sulfa Antibiotics Anaphylaxis  . Ace Inhibitors Cough  . Losartan Cough  .  Carvedilol Cough    Pt states it "causes him too cough."  . Lisinopril Cough    Pt reports worsening cough and "feeling wheezy."  . Soy Allergy Nausea And Vomiting    Discharge antibiotics: Ampicillin 2 gm Q24h (either q6h push or via pump)  + Ceftriaxone 2 gm IV Q12h  Duration: 6 weeks   End Date: 05/11/18  [facility] Maintenance Per Protocol _x_ Please pull PIC at completion of IV antibiotics __ Please leave PIC in place until doctor has seen patient or been notified  Labs weekly while on IV antibiotics: _x_ CBC with differential _x_ CMP   Fax weekly labs to [phone]  Clinic Follow Up Appt: 3 weeks with Dr. Linus Salmons November 21 @ 10:00 am  Janene Madeira, MSN, NP-C [facility] for Infectious Disease [email or web address] Pager: [phone] Office: [phone]

## 2018-04-01 NOTE — Progress Notes (Signed)
PROGRESS NOTE                                                                                                                                                                                                             Patient Demographics:    Ryan [name], is a 84 y.o. male, DOB - [birth year], XTG:[id number]  Admit date - 03/29/2018   Admitting Physician Karmen Bongo, MD  Outpatient Primary MD for the patient is Dorothyann Peng, NP  LOS - 3  Chief Complaint  Patient presents with  . Abnormal Lab       Brief Narrative   Ryan [name] is a 84 y.o. 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  He was admitted to the hospital for reoccurrence of endocarditis with enterococcus bacteremia.   Subjective:   Patient in bed, appears comfortable, denies any headache, no fever, no chest pain or pressure, no shortness of breath , no abdominal pain. No focal weakness.   Assessment  & Plan :     1.  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  Repeat surveillance blood cultures obtained 03/30/2018 are negative, will place a PICC line on 04/01/2018 around midday, tentative plan will be to discharge him on Friday with 4 weeks of IV ampicillin every 8 hours and IV Rocephin  twice daily thereafter long-term oral suppressive treatment per ID.  2.  Chronic combined systolic and diastolic heart failure.  EF close to 50% on last echocardiogram.  Currently on beta-blocker, placed on fluid restriction, due to CKD 4 cannot use ACE/ARB.  3.  Paroxysmal atrial fibrillation [place] vas 2 score of at least 3.  Currently on beta-blocker Eliquis combination continue.  Has pacemaker placed.  4.  CKD 4.  Baseline creatinine around 2 to 2.5 recently, continue to monitor.  5.  CAD status post DES  to RCA in 2017.  No chest pain, continue combination of Imdur, beta-blocker, statin and Eliquis.  6.  Essential hypertension.  On combination of Norvasc and beta-blocker will monitor.  7.  Hypothyroidism.  On Synthroid continue.  8.  Dyslipidemia.  On statin continue unchanged.  9. Mild Chr.Thrombocytopenia - in 100K range, monitor.   Lab Results  Component Value Date   PLT 164 04/01/2018     Family Communication  :  Son in Sports coach  Code Status :  Full  Disposition Plan  : Telemetry, likely discharge on Friday if remains stable  Consults  :  ID, Cards  Procedures  :    TEE -   Left Ventricle: Normal EF,  Mitral Valve: Mild MR, no vegetation.  Aortic Valve: TAVR, mild to moderate eccentric AI. No vegetation. Tricuspid Valve: mild TR. Left Atrium: No LAA thrombus. Pacer wires: no obvious vegetations detected  Impression: Overall reassuring TEE. No signs of endocarditis.    DVT Prophylaxis  :  Eliquis  Diet :  Diet Order            Diet Heart Room service appropriate? Yes; Fluid consistency: Thin  Diet effective now               Inpatient Medications Scheduled Meds: . amLODipine  5 mg Oral Daily  . apixaban  2.5 mg Oral BID  . atorvastatin  40 mg Oral q1800  . docusate sodium  100 mg Oral BID  . isosorbide mononitrate  60 mg Oral Daily  . latanoprost  1 drop Both Eyes QHS  . levothyroxine  12.5 mcg Oral QAC breakfast  . lidocaine  1 patch Transdermal Daily    . metoprolol succinate  100 mg Oral Daily  . mometasone-formoterol  2 puff Inhalation Daily  . pantoprazole  20 mg Oral Daily   Continuous Infusions: . sodium chloride Stopped (03/31/18 2327)  . sodium chloride Stopped (04/01/18 8182)  . ampicillin (OMNIPEN) IV Stopped (04/01/18 0630)  . cefTRIAXone (ROCEPHIN)  IV 2 g (04/01/18 0835)   PRN Meds:.sodium chloride, sodium chloride, acetaminophen **OR** acetaminophen, acetaminophen-codeine, albuterol, ondansetron **OR** ondansetron (ZOFRAN) IV, polyethylene glycol, senna  Antibiotics  :   Anti-infectives (From admission, onward)   Start     Dose/Rate Route Frequency Ordered Stop   03/29/18 2200  ampicillin (OMNIPEN) 2 g in sodium chloride 0.9 % 100 mL IVPB     2 g 300 mL/hr over 20 Minutes Intravenous Every 8 hours 03/29/18 1628     03/29/18 1215  cefTRIAXone (ROCEPHIN) 2 g in sodium chloride 0.9 % 100 mL IVPB     2 g 200 mL/hr over 30 Minutes Intravenous Every 12 hours 03/29/18 1204     03/29/18 1130  ampicillin (OMNIPEN) 2 g in sodium chloride 0.9 % 100 mL IVPB  Status:  Discontinued     2 g 300 mL/hr over 20 Minutes Intravenous Every 12 hours 03/29/18 1106 03/29/18 1628          Objective:   Vitals:   04/01/18 0550 04/01/18 0803 04/01/18 0826 04/01/18 0827  BP: 136/61  131/60   Pulse: 66   62  Resp:      Temp: 97.7 F (36.5 C)     TempSrc:      SpO2: 95% 97%      Wt Readings from Last 3 Encounters:  03/28/18 57.6 kg  01/28/18 58.7 kg  01/19/18 58 kg     Intake/Output Summary (Last 24 hours) at 04/01/2018 1145 Last  data filed at 04/01/2018 0308 Gross per 24 hour  Intake 774.34 ml  Output 300 ml  Net 474.34 ml     Physical Exam  Awake Alert, Oriented X 3, No new F.N deficits, Normal affect [place].AT,PERRAL Supple Neck,No JVD, No cervical lymphadenopathy appriciated.  Symmetrical Chest wall movement, Good air movement bilaterally, CTAB RRR,No Gallops, Rubs or new Murmurs, No Parasternal Heave +ve B.Sounds,  Abd Soft, No tenderness, No organomegaly appriciated, No rebound - guarding or rigidity. No Cyanosis, Clubbing or edema, No new Rash or bruise     Data Review:    CBC Recent Labs  Lab 03/28/18 1656 03/29/18 1054 03/30/18 0743 03/31/18 0259 04/01/18 0414  WBC 8.3 12.5* 7.6 5.6 6.9  HGB 10.6* 10.4* 10.9* 9.0* 10.4*  HCT 33.3* 33.1* 34.9* 28.5* 33.0*  PLT 120* 123* 126* 120* 164  MCV 92.8 93.8 93.1 92.8 92.7  MCH 29.5 29.5 29.1 29.3 29.2  MCHC 31.8 31.4 31.2 31.6 31.5  RDW 13.1 13.2 13.2 13.2 13.0  LYMPHSABS  --  1.0  --   --   --   MONOABS  --  1.2*  --   --   --   EOSABS  --  0.0  --   --   --   BASOSABS  --  0.0  --   --   --     Chemistries  Recent Labs  Lab 03/28/18 1656 03/29/18 1054 03/30/18 0743 03/31/18 0259 04/01/18 0414  NA 140 139 141 141 141  K 4.4 4.1 3.8 4.0 3.8  CL 107 107 108 111 110  CO2 22 24 24 25 23   GLUCOSE 119* 159* 107* 102* 99  BUN 34* 35* 29* 29* 32*  CREATININE 2.03* 2.30* 2.18* 2.17* 2.43*  CALCIUM 8.9 8.8* 8.9 8.5* 9.0  MG  --   --   --  2.1  --   AST 20 21  --   --   --   ALT 14 13  --   --   --   ALKPHOS 69 73  --   --   --   BILITOT 0.7 0.8  --   --   --    ------------------------------------------------------------------------------------------------------------------ No results for input(s): CHOL, HDL, LDLCALC, TRIG, CHOLHDL, LDLDIRECT in the last 72 hours.  Lab Results  Component Value Date   HGBA1C 5.5 06/06/2016   ------------------------------------------------------------------------------------------------------------------ No results for input(s): TSH, T4TOTAL, T3FREE, THYROIDAB in the last 72 hours.  Invalid input(s): FREET3 ------------------------------------------------------------------------------------------------------------------ No results for input(s): VITAMINB12, FOLATE, FERRITIN, TIBC, IRON, RETICCTPCT in the last 72 hours.  Coagulation profile No results for input(s): INR, PROTIME in the last 168  hours.  No results for input(s): DDIMER in the last 72 hours.  Cardiac Enzymes No results for input(s): CKMB, TROPONINI, MYOGLOBIN in the last 168 hours.  Invalid input(s): CK ------------------------------------------------------------------------------------------------------------------    Component Value Date/Time   BNP 219.3 (H) 11/07/2017 3016    Micro Results Recent Results (from the past 240 hour(s))  Blood culture (routine x 2)     Status: Abnormal   Collection Time: 03/28/18  4:56 PM  Result Value Ref Range Status   Specimen Description   Final    BLOOD RIGHT ARM Performed at [facility] [address] [place], [place] [address]    Special Requests   Final    BAA BCHV Performed at [facility], [place] [address], [place], [place] [address]    Culture  Setup Time   Final  GRAM POSITIVE COCCI IN BOTH AEROBIC AND ANAEROBIC BOTTLES CRITICAL VALUE NOTED.  VALUE IS CONSISTENT WITH PREVIOUSLY REPORTED AND CALLED VALUE.    Culture (A)  Final    ENTEROCOCCUS FAECALIS SUSCEPTIBILITIES PERFORMED ON PREVIOUS CULTURE WITHIN THE LAST 5 DAYS. Performed at [facility], [place] [address] [place], [place] [address]    Report Status 03/31/2018 FINAL  Final  Blood culture (routine x 2)     Status: Abnormal   Collection Time: 03/28/18  5:20 PM  Result Value Ref Range Status   Specimen Description   Final    BLOOD LEFT ARM Performed at [facility] [address] [place], [place] [address]    Special Requests   Final    BAA BCHV Performed at [facility], [place] [address], [place], [place] [address]    Culture  Setup Time   Final    GRAM POSITIVE COCCI IN BOTH AEROBIC AND ANAEROBIC BOTTLES CRITICAL RESULT CALLED TO, READ BACK BY AND VERIFIED WITH: RN A HARTLEY [id number] Performed at [facility], [address] [place], [place] [address]    Culture ENTEROCOCCUS FAECALIS (A)   Final   Report Status 03/31/2018 FINAL  Final   Organism ID, Bacteria ENTEROCOCCUS FAECALIS  Final      Susceptibility   Enterococcus faecalis - MIC*    AMPICILLIN <=2 SENSITIVE Sensitive     VANCOMYCIN 2 SENSITIVE Sensitive     GENTAMICIN SYNERGY SENSITIVE Sensitive     * ENTEROCOCCUS FAECALIS  Blood Culture ID Panel (Reflexed)     Status: Abnormal   Collection Time: 03/28/18  5:20 PM  Result Value Ref Range Status   Enterococcus species DETECTED (A) NOT DETECTED Final    Comment: CRITICAL RESULT CALLED TO, READ BACK BY AND VERIFIED WITH: RN A HARTLEY [id number] MLM    Vancomycin resistance NOT DETECTED NOT DETECTED Final   Listeria monocytogenes NOT DETECTED NOT DETECTED Final   Staphylococcus species NOT DETECTED NOT DETECTED Final   Staphylococcus aureus (BCID) NOT DETECTED NOT DETECTED Final   Streptococcus species NOT DETECTED NOT DETECTED Final   Streptococcus agalactiae NOT DETECTED NOT DETECTED Final   Streptococcus pneumoniae NOT DETECTED NOT DETECTED Final   Streptococcus pyogenes NOT DETECTED NOT DETECTED Final   Acinetobacter baumannii NOT DETECTED NOT DETECTED Final   Enterobacteriaceae species NOT DETECTED NOT DETECTED Final   Enterobacter cloacae complex NOT DETECTED NOT DETECTED Final   Escherichia coli NOT DETECTED NOT DETECTED Final   Klebsiella oxytoca NOT DETECTED NOT DETECTED Final   Klebsiella pneumoniae NOT DETECTED NOT DETECTED Final   Proteus species NOT DETECTED NOT DETECTED Final   Serratia marcescens NOT DETECTED NOT DETECTED Final   Haemophilus influenzae NOT DETECTED NOT DETECTED Final   Neisseria meningitidis NOT DETECTED NOT DETECTED Final   Pseudomonas aeruginosa NOT DETECTED NOT DETECTED Final   Candida albicans NOT DETECTED NOT DETECTED Final   Candida glabrata NOT DETECTED NOT DETECTED Final   Candida krusei NOT DETECTED NOT DETECTED Final   Candida parapsilosis NOT DETECTED NOT DETECTED Final   Candida tropicalis NOT DETECTED NOT DETECTED  Final    Comment: Performed at [facility], [place]. [address], [place], [place] [address]  Culture, blood (routine x 2)     Status: Abnormal   Collection Time: 03/29/18  1:49 PM  Result Value Ref Range Status   Specimen Description BLOOD LEFT ANTECUBITAL  Final   Special Requests   Final  BOTTLES DRAWN AEROBIC AND ANAEROBIC Blood Culture adequate volume   Culture  Setup Time   Final    GRAM POSITIVE COCCI IN BOTH AEROBIC AND ANAEROBIC BOTTLES CRITICAL RESULT CALLED TO, READ BACK BY AND VERIFIED WITH: PHARMD JAMES [id number] FCP    Culture (A)  Final    ENTEROCOCCUS FAECALIS SUSCEPTIBILITIES PERFORMED ON PREVIOUS CULTURE WITHIN THE LAST 5 DAYS. Performed at [facility], [place] [address] [place], [place] [address]    Report Status 04/01/2018 FINAL  Final  Blood Culture ID Panel (Reflexed)     Status: Abnormal   Collection Time: 03/29/18  1:49 PM  Result Value Ref Range Status   Enterococcus species DETECTED (A) NOT DETECTED Final    Comment: CRITICAL RESULT CALLED TO, READ BACK BY AND VERIFIED WITH: PHARMD JAMES L. [id number]    Vancomycin resistance NOT DETECTED NOT DETECTED Final   Listeria monocytogenes NOT DETECTED NOT DETECTED Final   Staphylococcus species NOT DETECTED NOT DETECTED Final   Staphylococcus aureus (BCID) NOT DETECTED NOT DETECTED Final   Streptococcus species NOT DETECTED NOT DETECTED Final   Streptococcus agalactiae NOT DETECTED NOT DETECTED Final   Streptococcus pneumoniae NOT DETECTED NOT DETECTED Final   Streptococcus pyogenes NOT DETECTED NOT DETECTED Final   Acinetobacter baumannii NOT DETECTED NOT DETECTED Final   Enterobacteriaceae species NOT DETECTED NOT DETECTED Final   Enterobacter cloacae complex NOT DETECTED NOT DETECTED Final   Escherichia coli NOT DETECTED NOT DETECTED Final   Klebsiella oxytoca NOT DETECTED NOT DETECTED Final   Klebsiella pneumoniae NOT DETECTED NOT DETECTED Final   Proteus species NOT DETECTED NOT DETECTED  Final   Serratia marcescens NOT DETECTED NOT DETECTED Final   Haemophilus influenzae NOT DETECTED NOT DETECTED Final   Neisseria meningitidis NOT DETECTED NOT DETECTED Final   Pseudomonas aeruginosa NOT DETECTED NOT DETECTED Final   Candida albicans NOT DETECTED NOT DETECTED Final   Candida glabrata NOT DETECTED NOT DETECTED Final   Candida krusei NOT DETECTED NOT DETECTED Final   Candida parapsilosis NOT DETECTED NOT DETECTED Final   Candida tropicalis NOT DETECTED NOT DETECTED Final    Comment: Performed at [facility], [place]. [address] [place], [place] [address]  Culture, blood (routine x 2)     Status: None (Preliminary result)   Collection Time: 03/29/18  1:54 PM  Result Value Ref Range Status   Specimen Description BLOOD LEFT ANTECUBITAL  Final   Special Requests   Final    BOTTLES DRAWN AEROBIC AND ANAEROBIC Blood Culture adequate volume   Culture   Final    NO GROWTH 2 DAYS Performed at [facility], [place] [address] [place], [place] [address]    Report Status PENDING  Incomplete  Culture, blood (routine x 2)     Status: None (Preliminary result)   Collection Time: 03/30/18  7:48 AM  Result Value Ref Range Status   Specimen Description BLOOD RIGHT ANTECUBITAL  Final   Special Requests   Final    BOTTLES DRAWN AEROBIC AND ANAEROBIC Blood Culture adequate volume   Culture   Final    NO GROWTH 1 DAY Performed at [facility], [place] [address] [place], [place] [address]    Report Status PENDING  Incomplete  Culture, blood (routine x 2)     Status: None (Preliminary result)   Collection Time: 03/30/18  7:51 AM  Result Value Ref Range Status   Specimen Description BLOOD LEFT ANTECUBITAL  Final  Special Requests   Final    BOTTLES DRAWN AEROBIC AND ANAEROBIC Blood Culture adequate volume   Culture   Final    NO GROWTH 1 DAY Performed at [facility], [place] [address], [place], [place] [address]    Report Status PENDING  Incomplete    Radiology  Reports Dg Chest 2 View  Result Date: 03/28/2018 CLINICAL DATA:  Fever 2 days ago returning today. Shortness of breath on exertion. Generalized weakness. EXAM: CHEST - 2 VIEW COMPARISON:  01/15/2018 FINDINGS: Status post endovascular aortic valve replacement. Heart is top-normal in size. No mediastinal or hilar masses. No evidence of adenopathy. Clear lungs.  No pleural effusion or pneumothorax. Stable left anterior chest wall sequential pacemaker. Skeletal structures are demineralized but intact. IMPRESSION: No acute cardiopulmonary disease. Electronically Signed   By: Lajean Manes M.D.   On: 03/28/2018 17:43   [place] Ekg Site Rite  Result Date: 04/01/2018 If Site Rite image not attached, placement could not be confirmed due to current cardiac rhythm.   Time Spent in minutes  30   Lala Lund M.D on 04/01/2018 at 11:45 AM  To page go to [email or web address] - password [email or web address]

## 2018-04-01 NOTE — Progress Notes (Signed)
Peripherally Inserted Central Catheter/Midline Placement  The IV Nurse has discussed with the patient and/or persons authorized to consent for the patient, the purpose of this procedure and the potential benefits and risks involved with this procedure.  The benefits include less needle sticks, lab draws from the catheter, and the patient may be discharged home with the catheter. Risks include, but not limited to, infection, bleeding, blood clot (thrombus formation), and puncture of an artery; nerve damage and irregular heartbeat and possibility to perform a PICC exchange if needed/ordered by physician.  Alternatives to this procedure were also discussed.  Bard Power PICC patient education guide, fact sheet on infection prevention and patient information card has been provided to patient /or left at bedside.    PICC/Midline Placement Documentation  PICC Double Lumen 04/01/18 PICC Right Brachial 36 cm 0 cm (Active)  Indication for Insertion or Continuance of Line Home intravenous therapies (PICC only) 04/01/2018  6:53 PM  Exposed Catheter (cm) 0 cm 04/01/2018  6:53 PM  Site Assessment Clean;Dry;Intact 04/01/2018  6:53 PM  Lumen #1 Status Flushed;Blood return noted 04/01/2018  6:53 PM  Lumen #2 Status Flushed;Blood return noted 04/01/2018  6:53 PM  Dressing Type Transparent 04/01/2018  6:53 PM  Dressing Status Clean;Dry;Intact;Antimicrobial disc in place 04/01/2018  6:53 PM  Dressing Intervention New dressing 04/01/2018  6:53 PM  Dressing Change Due 04/08/18 04/01/2018  6:53 PM       Ryan [name] 04/01/2018, 6:54 PM

## 2018-04-02 MED ORDER — AMPICILLIN IV (FOR PTA / DISCHARGE USE ONLY): 2.0000 g | Freq: Three times a day (TID) | INTRAVENOUS | 0 refills | Status: DC

## 2018-04-02 MED ORDER — AMPICILLIN IV (FOR PTA / DISCHARGE USE ONLY): 6.0000 g | INTRAVENOUS | 0 refills | Status: DC

## 2018-04-02 MED ORDER — AMPICILLIN IV (FOR PTA / DISCHARGE USE ONLY): 2.0000 g | Freq: Three times a day (TID) | INTRAVENOUS | 0 refills | Status: AC

## 2018-04-02 MED ORDER — CEFTRIAXONE IV (FOR PTA / DISCHARGE USE ONLY): 2.0000 g | Freq: Two times a day (BID) | INTRAVENOUS | 0 refills | Status: DC

## 2018-04-02 NOTE — Progress Notes (Signed)
Patient resting comfortably, no needs on round.

## 2018-04-02 NOTE — Discharge Summary (Signed)
Ryan [name] QMG:[id number] DOB: Oct 08, 1929 DOA: 03/29/2018  PCP: Dorothyann Peng, NP  Admit date: 03/29/2018  Discharge date: 04/02/2018  Admitted From: Home   Disposition:  Home   Recommendations for Outpatient Follow-up:   Follow up with PCP in 1-2 weeks  PCP Please obtain BMP/CBC, 2 view CXR in 1week,  (see Discharge instructions)   PCP Please follow up on the following pending results:    Home Health: PT,RN, Aide, S work   Equipment/Devices: [employer]  Consultations: ID, Cards Discharge Condition: Fair   CODE STATUS: Full   Diet Recommendation: Heart Healthy    Chief Complaint  Patient presents with  . 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  He was admitted to the hospital for reoccurrence of endocarditis with enterococcus bacteremia.                                                                 Hospital Course   1.  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  Repeat surveillance blood cultures obtained 03/30/2018 are negative till date, received a right arm double-lumen PICC line on 04/01/2018, he will receive IV ampicillin along with twice daily Rocephin IV through PICC line for 6 weeks.  Thereafter he will follow with ID for prolonged oral antibiotics for continued suppression.  Clinically he is appearing to be nontoxic.  2.  Chronic combined systolic and diastolic heart failure.  EF close to 50% on last echocardiogram.  Currently on beta-blocker, placed on fluid restriction, due to CKD 4 cannot use ACE/ARB.  3.  Paroxysmal atrial fibrillation [place] vas 2 score of at least 3.  Currently on beta-blocker Eliquis combination continue.  Has pacemaker placed.  4.  CKD 4.  Baseline creatinine around 2 to 2.5 recently, continue to monitor.  5.  CAD status post DES to RCA in 2017.  No chest pain, continue combination of Imdur, beta-blocker, statin and Eliquis.  6.  Essential hypertension.  On combination of Norvasc and beta-blocker will monitor.  7.  Hypothyroidism.  On Synthroid continue.  8.  Dyslipidemia.  On statin continue unchanged.  9. Mild Chr.Thrombocytopenia - in 100K range, follow with PCP unchanged.    Discharge diagnosis     Principal Problem:   Bacteremia due to Enterococcus Active Problems:   PAF (paroxysmal atrial fibrillation) (HCC)   Chronic combined systolic (congestive) and diastolic (congestive) heart failure (HCC)   Hypertension   Prosthetic valve endocarditis (HCC)   S/P AVR   Acute renal insufficiency   Presence of permanent cardiac pacemaker    Discharge instructions    Discharge Instructions    Diet - low sodium heart healthy   Complete by:  As directed    Discharge instructions   Complete by:  As directed    Follow with Primary MD Dorothyann Peng, NP in 7 days   Get CBC, CMP checked  by Primary MD  in 5-7 days   Activity: As tolerated with Full fall precautions use walker/cane &  assistance as needed  Disposition Home    Diet: Heart Healthy     Special Instructions: If you have smoked or chewed Tobacco  in the last 2 yrs please stop smoking, stop any regular Alcohol  and or any Recreational drug use.  On your next visit with your primary care physician please Get Medicines reviewed and adjusted.  Please request your Prim.MD to go over all Hospital Tests and Procedure/Radiological results at the follow up, please get all Hospital records sent to your Prim MD by signing hospital release before you go home.  If you experience worsening of your admission symptoms, develop shortness of breath, life threatening emergency, suicidal or homicidal thoughts you must seek medical attention immediately by calling 911 or calling your MD immediately  if symptoms less severe.   Home infusion instructions [facility] May follow [place] Dosing Protocol; May administer Cathflo as needed to maintain patency of vascular access device.; Flushing of vascular access device: per [facility] Protocol: 0.9% NaCl pre/post medica...   Complete by:  As directed    Instructions:  May follow [place] Dosing Protocol   Instructions:  May administer Cathflo as needed to maintain patency of vascular access device.   Instructions:  Flushing of vascular access device: per [facility] Protocol: 0.9% NaCl pre/post medication administration and prn patency; Heparin 100 u/ml, 30m for implanted ports and Heparin 10u/ml, 521mfor all other central venous catheters.   Instructions:  May follow [facility] Anaphylaxis Protocol for First Dose Administration in the home: 0.9% NaCl at 25-50 ml/hr to maintain IV access for protocol meds. Epinephrine 0.3 ml IV/IM PRN and Benadryl 25-50 IV/IM PRN s/s of anaphylaxis.   Instructions:  AdSugarmill Woodsnfusion Coordinator (RN) to assist per patient IV care needs in the home PRN.   Increase activity slowly   Complete by:  As directed       Discharge Medications   Allergies  as of 04/02/2018      Reactions   Ciprofloxacin Anaphylaxis   Hydrochlorothiazide Anaphylaxis   Sulfa Antibiotics Anaphylaxis   Ace Inhibitors Cough   Losartan Cough   Carvedilol Cough   Pt states it "causes him too cough."   Lisinopril Cough   Pt reports worsening cough and "feeling wheezy."   Soy Allergy Nausea And Vomiting      Medication List    TAKE these medications   albuterol (2.5 MG/3ML) 0.083% nebulizer solution Commonly known as:  PROVENTIL Take 3 mLs (2.5 mg total) by nebulization every 8 (  eight) hours as needed for wheezing or shortness of breath.   amLODipine 5 MG tablet Commonly known as:  NORVASC Take 1 tablet (5 mg total) by mouth daily.   ampicillin  IVPB Inject 2 g into the vein every 8 (eight) hours. Indication: PVE/PPM infection Last Day of Therapy:  May 10, 2018 Labs - Once weekly:  CBC/D and BMP, Labs - Every other week:  ESR and CRP   apixaban 2.5 MG Tabs tablet Commonly known as:  ELIQUIS Take 1 tablet (2.5 mg total) by mouth 2 (two) times daily.   atorvastatin 40 MG tablet Commonly known as:  LIPITOR Take 1 tablet (40 mg total) by mouth daily at 6 PM.   budesonide-formoterol 80-4.5 MCG/ACT inhaler Commonly known as:  SYMBICORT Inhale 2 puffs into the lungs 2 (two) times daily. What changed:  when to take this   cefTRIAXone  IVPB Commonly known as:  ROCEPHIN Inject 2 g into the vein every 12 (twelve) hours. Indication:  PVE/PPM infection Last Day of Therapy:  May 10, 2018 Labs - Once weekly:  CBC/D and BMP, Labs - Every other week:  ESR and CRP   cholecalciferol 1000 units tablet Commonly known as:  VITAMIN D Take 1,000 Units by mouth 2 (two) times a week.   isosorbide mononitrate 60 MG 24 hr tablet Commonly known as:  IMDUR Take 1 tablet (60 mg total) by mouth daily.   lansoprazole 15 MG capsule Commonly known as:  PREVACID Take 1 capsule (15 mg total) by mouth daily at 12 noon. What changed:    when to take  this  reasons to take this   latanoprost 0.005 % ophthalmic solution Commonly known as:  XALATAN Place 1 drop into both eyes at bedtime.   levothyroxine 25 MCG tablet Commonly known as:  SYNTHROID, LEVOTHROID Take 0.5 tablets (12.5 mcg total) by mouth daily before breakfast.   menthol-cetylpyridinium 3 MG lozenge Commonly known as:  CEPACOL Take 1 lozenge (3 mg total) by mouth as needed for sore throat.   metoprolol succinate 100 MG 24 hr tablet Commonly known as:  TOPROL-XL Take 1 tablet (100 mg total) by mouth daily. Take with or immediately following a meal.   nitroGLYCERIN 0.4 MG SL tablet Commonly known as:  NITROSTAT Place 1 tablet (0.4 mg total) under the tongue every 5 (five) minutes x 3 doses as needed for chest pain.   polyethylene glycol packet Commonly known as:  MIRALAX / GLYCOLAX Take 17 g by mouth daily. What changed:    when to take this  reasons to take this   RHOPRESSA 0.02 % Soln Generic drug:  Netarsudil Dimesylate Place 1 drop into both eyes at bedtime.   senna 8.6 MG Tabs tablet Commonly known as:  SENOKOT Take 1 tablet (8.6 mg total) by mouth daily as needed for mild constipation.   vitamin B-12 1000 MCG tablet Commonly known as:  CYANOCOBALAMIN Take 1,000 mcg by mouth daily.            Home Infusion Instuctions  (From admission, onward)         Start     Ordered   04/02/18 0000  Home infusion instructions [facility] May follow [place] Dosing Protocol; May administer Cathflo as needed to maintain patency of vascular access device.; Flushing of vascular access device: per [facility] Protocol: 0.9% NaCl pre/post medica...    Question Answer Comment  Instructions May follow [place] Dosing Protocol   Instructions May administer Cathflo as needed  to maintain patency of vascular access device.   Instructions Flushing of vascular access device: per [facility] Protocol: 0.9% NaCl pre/post medication administration and prn patency; Heparin  100 u/ml, 64m for implanted ports and Heparin 10u/ml, 555mfor all other central venous catheters.   Instructions May follow [facility] Anaphylaxis Protocol for First Dose Administration in the home: 0.9% NaCl at 25-50 ml/hr to maintain IV access for protocol meds. Epinephrine 0.3 ml IV/IM PRN and Benadryl 25-50 IV/IM PRN s/s of anaphylaxis.   Instructions [facility] Infusion Coordinator (RN) to assist per patient IV care needs in the home PRN.      04/02/18 0712          Follow-up Information    [facility], [facility]-Home Follow up.   Specialty:  [place]:  Home Health RN, IV antibiotics-agency will call to arrange initial appointment Contact information: [id number]      CoThayer HeadingsMD Follow up on 04/22/2018.   Specialty:  Infectious Diseases Why:  10:00 am appointment  Contact information: [phone]           Major procedures and Radiology Reports - PLEASE review detailed and final reports thoroughly  -      Right arm double-lumen PICC line placed on 04/01/2018.     TEE -   Left Ventricle:Normal EF,  Mitral Valve:Mild MR, no vegetation.  Aortic Valve:TAVR, mild to moderate eccentric AI. No vegetation. Tricuspid Valve:mild TR. Left Atrium:No LAA thrombus. Pacer wires: no obvious vegetations detected  Impression: Overall reassuring TEE. No signs of endocarditis.    Dg Chest 2 View  Result Date: 03/28/2018 CLINICAL DATA:  Fever 2 days ago returning today. Shortness of breath on exertion. Generalized weakness. EXAM: CHEST - 2 VIEW COMPARISON:  01/15/2018 FINDINGS: Status post endovascular aortic valve replacement. Heart is top-normal in size. No mediastinal or hilar masses. No evidence of adenopathy. Clear lungs.  No pleural effusion or pneumothorax. Stable left anterior chest wall sequential pacemaker. Skeletal structures are  demineralized but intact. IMPRESSION: No acute cardiopulmonary disease. Electronically Signed   By: DaLajean Manes.D.   On: 03/28/2018 17:43   Us[place]akg Site Rite  Result Date: 04/01/2018 If Site Rite image not attached, placement could not be confirmed due to current cardiac rhythm.   Micro Results    Recent Results (from the past 240 hour(s))  Blood culture (routine x 2)     Status: Abnormal   Collection Time: 03/28/18  4:56 PM  Result Value Ref Range Status   Specimen Description   Final    BLOOD RIGHT ARM Performed at [address] [place] [address]  Special Requests   Final    BAA BCHV Performed at [facility] [place] [address]  Culture  Setup Time   Final    GRAM POSITIVE COCCI IN BOTH AEROBIC AND ANAEROBIC BOTTLES CRITICAL VALUE NOTED.  VALUE IS CONSISTENT WITH PREVIOUSLY REPORTED AND CALLED VALUE.    Culture (A)  Final    ENTEROCOCCUS FAECALIS SUSCEPTIBILITIES PERFORMED ON PREVIOUS CULTURE WITHIN THE LAST 5 DAYS. Performed at [facility] [place] [address]  Report Status 03/31/2018 FINAL  Final  Blood culture (routine x 2)     Status: Abnormal   Collection Time: 03/28/18  5:20 PM  Result Value Ref Range Status   Specimen Description   Final    BLOOD LEFT ARM Performed at [place] [address], [place], [place] [address]    Special Requests   Final    BAA BCHV Performed at [facility], [place] [address] [place], [place] [address]    Culture  Setup Time   Final    GRAM POSITIVE COCCI IN BOTH AEROBIC AND ANAEROBIC BOTTLES CRITICAL RESULT CALLED TO, READ BACK BY AND VERIFIED WITH: RN A HARTLEY [id number] Performed at [facility], [address], [place], [place] [address]    Culture ENTEROCOCCUS FAECALIS (A)  Final   Report Status 03/31/2018 FINAL  Final   Organism ID, Bacteria  ENTEROCOCCUS FAECALIS  Final      Susceptibility   Enterococcus faecalis - MIC*    AMPICILLIN <=2 SENSITIVE Sensitive     VANCOMYCIN 2 SENSITIVE Sensitive     GENTAMICIN SYNERGY SENSITIVE Sensitive     * ENTEROCOCCUS FAECALIS  Blood Culture ID Panel (Reflexed)     Status: Abnormal   Collection Time: 03/28/18  5:20 PM  Result Value Ref Range Status   Enterococcus species DETECTED (A) NOT DETECTED Final    Comment: CRITICAL RESULT CALLED TO, READ BACK BY AND VERIFIED WITH: RN A HARTLEY [id number] MLM    Vancomycin resistance NOT DETECTED NOT DETECTED Final   Listeria monocytogenes NOT DETECTED NOT DETECTED Final   Staphylococcus species NOT DETECTED NOT DETECTED Final   Staphylococcus aureus (BCID) NOT DETECTED NOT DETECTED Final   Streptococcus species NOT DETECTED NOT DETECTED Final   Streptococcus agalactiae NOT DETECTED NOT DETECTED Final   Streptococcus pneumoniae NOT DETECTED NOT DETECTED Final   Streptococcus pyogenes NOT DETECTED NOT DETECTED Final   Acinetobacter baumannii NOT DETECTED NOT DETECTED Final   Enterobacteriaceae species NOT DETECTED NOT DETECTED Final   Enterobacter cloacae complex NOT DETECTED NOT DETECTED Final   Escherichia coli NOT DETECTED NOT DETECTED Final   Klebsiella oxytoca NOT DETECTED NOT DETECTED Final   Klebsiella pneumoniae NOT DETECTED NOT DETECTED Final   Proteus species NOT DETECTED NOT DETECTED Final   Serratia marcescens NOT DETECTED NOT DETECTED Final   Haemophilus influenzae NOT DETECTED NOT DETECTED Final   Neisseria meningitidis NOT DETECTED NOT DETECTED Final   Pseudomonas aeruginosa NOT DETECTED NOT DETECTED Final   Candida albicans NOT DETECTED NOT DETECTED Final   Candida glabrata NOT DETECTED NOT DETECTED Final   Candida krusei NOT DETECTED NOT DETECTED Final   Candida parapsilosis NOT DETECTED NOT DETECTED Final   Candida tropicalis NOT DETECTED NOT DETECTED Final    Comment: Performed at [facility], [place]. [address] [place], [place] [address]  Culture, blood (routine x 2)     Status: Abnormal   Collection Time: 03/29/18  1:49 PM  Result Value Ref Range Status   Specimen Description BLOOD LEFT ANTECUBITAL  Final   Special Requests   Final    BOTTLES DRAWN AEROBIC AND ANAEROBIC Blood Culture adequate volume   Culture  Setup Time   Final    GRAM POSITIVE COCCI IN BOTH AEROBIC AND ANAEROBIC BOTTLES CRITICAL RESULT CALLED TO, READ BACK BY AND VERIFIED WITH: PHARMD JAMES [id number] FCP    Culture (A)  Final    ENTEROCOCCUS FAECALIS SUSCEPTIBILITIES PERFORMED ON PREVIOUS CULTURE WITHIN THE LAST 5 DAYS. Performed at [facility], [place] [address] [place], [place] [address]    Report Status  04/01/2018 FINAL  Final  Blood Culture ID Panel (Reflexed)     Status: Abnormal   Collection Time: 03/29/18  1:49 PM  Result Value Ref Range Status   Enterococcus species DETECTED (A) NOT DETECTED Final    Comment: CRITICAL RESULT CALLED TO, READ BACK BY AND VERIFIED WITH: PHARMD JAMES L. [phone] [id number]    Vancomycin resistance NOT DETECTED NOT DETECTED Final   Listeria monocytogenes NOT DETECTED NOT DETECTED Final   Staphylococcus species NOT DETECTED NOT DETECTED Final   Staphylococcus aureus (BCID) NOT DETECTED NOT DETECTED Final   Streptococcus species NOT DETECTED NOT DETECTED Final   Streptococcus agalactiae NOT DETECTED NOT DETECTED Final   Streptococcus pneumoniae NOT DETECTED NOT DETECTED Final   Streptococcus pyogenes NOT DETECTED NOT DETECTED Final   Acinetobacter baumannii NOT DETECTED NOT DETECTED Final   Enterobacteriaceae species NOT DETECTED NOT DETECTED Final   Enterobacter cloacae complex NOT DETECTED NOT DETECTED Final   Escherichia coli NOT DETECTED NOT DETECTED Final   Klebsiella oxytoca NOT DETECTED NOT DETECTED Final   Klebsiella pneumoniae NOT DETECTED NOT DETECTED Final   Proteus species NOT DETECTED NOT DETECTED Final   Serratia marcescens NOT DETECTED NOT DETECTED Final    Haemophilus influenzae NOT DETECTED NOT DETECTED Final   Neisseria meningitidis NOT DETECTED NOT DETECTED Final   Pseudomonas aeruginosa NOT DETECTED NOT DETECTED Final   Candida albicans NOT DETECTED NOT DETECTED Final   Candida glabrata NOT DETECTED NOT DETECTED Final   Candida krusei NOT DETECTED NOT DETECTED Final   Candida parapsilosis NOT DETECTED NOT DETECTED Final   Candida tropicalis NOT DETECTED NOT DETECTED Final    Comment: Performed at [facility], [place]. [address], [place], [place] [address]  Culture, blood (routine x 2)     Status: None (Preliminary result)   Collection Time: 03/29/18  1:54 PM  Result Value Ref Range Status   Specimen Description BLOOD LEFT ANTECUBITAL  Final   Special Requests   Final    BOTTLES DRAWN AEROBIC AND ANAEROBIC Blood Culture adequate volume   Culture   Final    NO GROWTH 3 DAYS Performed at [facility], [place] [address] [place], [place] [address]    Report Status PENDING  Incomplete  Culture, blood (routine x 2)     Status: None (Preliminary result)   Collection Time: 03/30/18  7:48 AM  Result Value Ref Range Status   Specimen Description BLOOD RIGHT ANTECUBITAL  Final   Special Requests   Final    BOTTLES DRAWN AEROBIC AND ANAEROBIC Blood Culture adequate volume   Culture   Final    NO GROWTH 2 DAYS Performed at [facility], [place] [address] [place], [place] [address]    Report Status PENDING  Incomplete  Culture, blood (routine x 2)     Status: None (Preliminary result)   Collection Time: 03/30/18  7:51 AM  Result Value Ref Range Status   Specimen Description BLOOD LEFT ANTECUBITAL  Final   Special Requests   Final    BOTTLES DRAWN AEROBIC AND ANAEROBIC Blood Culture adequate volume   Culture   Final    NO GROWTH 2 DAYS Performed at [facility], [place] [address] [place], [place] [address]    Report Status PENDING  Incomplete    Today   Subjective    Ryan [name] today has no headache,no chest  abdominal pain,no new weakness tingling or numbness, feels much better wants to go home today.  Objective   Blood pressure (!) 149/60, pulse 60, temperature 97.8 F (36.6 C), resp. rate 16, SpO2 98 %.   Intake/Output Summary (Last 24 hours) at 04/02/2018 0712 Last data filed at 04/02/2018 0100 Gross per 24 hour  Intake 300 ml  Output -  Net 300 ml    Exam Awake Alert, Oriented x 3, No new F.N deficits, Normal affect [place].AT,PERRAL Supple Neck,No JVD, No cervical lymphadenopathy appriciated.  Symmetrical Chest wall movement, Good air movement bilaterally, CTAB RRR,No Gallops,Rubs or new Murmurs, No Parasternal Heave +ve B.Sounds, Abd Soft, Non tender, No organomegaly appriciated, No rebound -guarding or rigidity. No Cyanosis, Clubbing or edema, No new Rash or bruise   Data Review   CBC w Diff:  Lab Results  Component Value Date   WBC 6.9 04/01/2018   HGB 10.4 (L) 04/01/2018   HGB 12.2 (L) 01/14/2018   HCT 33.0 (L) 04/01/2018   HCT 37.8 01/14/2018   PLT 164 04/01/2018   PLT 106 (L) 01/14/2018   LYMPHOPCT 8 03/29/2018   MONOPCT 10 03/29/2018   EOSPCT 0 03/29/2018   BASOPCT 0 03/29/2018    CMP:  Lab Results  Component Value Date   NA 141 04/01/2018   NA 142 01/14/2018   K 3.8 04/01/2018   CL 110 04/01/2018   CO2 23 04/01/2018   BUN 32 (H) 04/01/2018   BUN 39 (H) 01/14/2018   CREATININE 2.43 (H) 04/01/2018   CREATININE 1.18 (H) 05/22/2016   PROT 5.9 (L) 03/29/2018   PROT 5.7 (L) 12/02/2017   ALBUMIN 3.3 (L) 03/29/2018   ALBUMIN 3.6 12/02/2017   BILITOT 0.8 03/29/2018   BILITOT 0.3 12/02/2017   ALKPHOS 73 03/29/2018   AST 21 03/29/2018   ALT 13 03/29/2018  .   Total Time in preparing paper work, data evaluation and todays exam - 16 minutes  Lala Lund M.D on 04/02/2018 at [facility]  [phone]

## 2018-04-02 NOTE — Progress Notes (Addendum)
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 Ampicillin 2 gm every 8 hours  2. Ceftriaxone 2 gm every 12 hours   End date: May 10, 2018  IV antibiotic discharge orders are pended. To discharging provider:  please sign these orders via discharge navigator,  Select New Orders & click on the button choice - Manage This Unsigned Work.     Thank you for allowing [place] to participate in this patients care.   Jimmy Footman, PharmD, BCPS  Infectious Diseases Clinical Pharmacist  Phone: [phone] Please utilize Amion (under [place]) for appropriate number for your unit pharmacist. 04/02/2018 8:55 AM

## 2018-04-03 DIAGNOSIS — B952 Enterococcus as the cause of diseases classified elsewhere: Secondary | ICD-10-CM | POA: Diagnosis not present

## 2018-04-03 DIAGNOSIS — R7881 Bacteremia: Secondary | ICD-10-CM | POA: Diagnosis not present

## 2018-04-03 DIAGNOSIS — D696 Thrombocytopenia, unspecified: Secondary | ICD-10-CM | POA: Diagnosis not present

## 2018-04-03 DIAGNOSIS — I2510 Atherosclerotic heart disease of native coronary artery without angina pectoris: Secondary | ICD-10-CM | POA: Diagnosis not present

## 2018-04-03 DIAGNOSIS — I130 Hypertensive heart and chronic kidney disease with heart failure and stage 1 through stage 4 chronic kidney disease, or unspecified chronic kidney disease: Secondary | ICD-10-CM | POA: Diagnosis not present

## 2018-04-03 DIAGNOSIS — I471 Supraventricular tachycardia: Secondary | ICD-10-CM | POA: Diagnosis not present

## 2018-04-03 DIAGNOSIS — I5042 Chronic combined systolic (congestive) and diastolic (congestive) heart failure: Secondary | ICD-10-CM | POA: Diagnosis not present

## 2018-04-03 DIAGNOSIS — N184 Chronic kidney disease, stage 4 (severe): Secondary | ICD-10-CM | POA: Diagnosis not present

## 2018-04-03 DIAGNOSIS — I480 Paroxysmal atrial fibrillation: Secondary | ICD-10-CM | POA: Diagnosis not present

## 2018-04-03 DIAGNOSIS — I339 Acute and subacute endocarditis, unspecified: Secondary | ICD-10-CM | POA: Diagnosis not present

## 2018-04-03 DIAGNOSIS — J45909 Unspecified asthma, uncomplicated: Secondary | ICD-10-CM | POA: Diagnosis not present

## 2018-04-03 DIAGNOSIS — T826XXA Infection and inflammatory reaction due to cardiac valve prosthesis, initial encounter: Secondary | ICD-10-CM | POA: Diagnosis not present

## 2018-04-03 DIAGNOSIS — I495 Sick sinus syndrome: Secondary | ICD-10-CM | POA: Diagnosis not present

## 2018-04-03 LAB — CULTURE, BLOOD (ROUTINE X 2)
Culture: NO GROWTH
Special Requests: ADEQUATE

## 2018-04-04 LAB — CULTURE, BLOOD (ROUTINE X 2)
CULTURE: NO GROWTH
CULTURE: NO GROWTH
SPECIAL REQUESTS: ADEQUATE
SPECIAL REQUESTS: ADEQUATE

## 2018-04-06 DIAGNOSIS — I339 Acute and subacute endocarditis, unspecified: Secondary | ICD-10-CM | POA: Diagnosis not present

## 2018-04-06 LAB — BASIC METABOLIC PANEL
BUN: 31 — AB (ref 4–21)
CREATININE: 2.3 — AB (ref 0.6–1.3)
Glucose: 124
Potassium: 4.6 (ref 3.4–5.3)
Sodium: 140 (ref 137–147)

## 2018-04-06 LAB — CBC AND DIFFERENTIAL
HEMATOCRIT: 30 — AB (ref 41–53)
Hemoglobin: 9.8 — AB (ref 13.5–17.5)
Neutrophils Absolute: 4
Platelets: 222 (ref 150–399)
WBC: 6.7

## 2018-04-06 LAB — POCT ERYTHROCYTE SEDIMENTATION RATE, NON-AUTOMATED: SED RATE: 35

## 2018-04-09 ENCOUNTER — Other Ambulatory Visit: Payer: Self-pay

## 2018-04-09 ENCOUNTER — Other Ambulatory Visit: Payer: Self-pay | Admitting: Adult Health

## 2018-04-09 ENCOUNTER — Encounter (HOSPITAL_COMMUNITY): Payer: Self-pay | Admitting: *Deleted

## 2018-04-09 ENCOUNTER — Emergency Department (HOSPITAL_COMMUNITY)
Admission: EM | Admit: 2018-04-09 | Discharge: 2018-04-09 | Disposition: A | Discharge status: Home / Self Care | Payer: Medicare Other | Attending: Emergency Medicine | Admitting: Emergency Medicine

## 2018-04-09 DIAGNOSIS — Z8546 Personal history of malignant neoplasm of prostate: Secondary | ICD-10-CM | POA: Insufficient documentation

## 2018-04-09 DIAGNOSIS — J45909 Unspecified asthma, uncomplicated: Secondary | ICD-10-CM | POA: Insufficient documentation

## 2018-04-09 DIAGNOSIS — T82838A Hemorrhage of vascular prosthetic devices, implants and grafts, initial encounter: Secondary | ICD-10-CM | POA: Diagnosis not present

## 2018-04-09 DIAGNOSIS — Z79899 Other long term (current) drug therapy: Secondary | ICD-10-CM | POA: Diagnosis not present

## 2018-04-09 DIAGNOSIS — I5042 Chronic combined systolic (congestive) and diastolic (congestive) heart failure: Secondary | ICD-10-CM | POA: Diagnosis not present

## 2018-04-09 DIAGNOSIS — I2510 Atherosclerotic heart disease of native coronary artery without angina pectoris: Secondary | ICD-10-CM | POA: Diagnosis not present

## 2018-04-09 DIAGNOSIS — Z950 Presence of cardiac pacemaker: Secondary | ICD-10-CM | POA: Diagnosis not present

## 2018-04-09 DIAGNOSIS — Y828 Other medical devices associated with adverse incidents: Secondary | ICD-10-CM | POA: Insufficient documentation

## 2018-04-09 DIAGNOSIS — Z7901 Long term (current) use of anticoagulants: Secondary | ICD-10-CM | POA: Diagnosis not present

## 2018-04-09 DIAGNOSIS — I110 Hypertensive heart disease with heart failure: Secondary | ICD-10-CM | POA: Insufficient documentation

## 2018-04-09 DIAGNOSIS — Z8551 Personal history of malignant neoplasm of bladder: Secondary | ICD-10-CM | POA: Diagnosis not present

## 2018-04-09 HISTORY — DX: Acute and subacute infective endocarditis: I33.0

## 2018-04-09 NOTE — Progress Notes (Signed)
PICC site with signs of bleeding. Dressing changed per protocol and noted blood clot over exit site. No new bleeding after removal of blood clot. Both lumens flush easily with blood return present. No signs of line dislocation. RN notified.

## 2018-04-09 NOTE — ED Triage Notes (Signed)
Patient states he woke up this am and his PICC line was bleeding  Right upper arm. States he is due for a injection today

## 2018-04-09 NOTE — ED Provider Notes (Signed)
[facility] EMERGENCY DEPARTMENT Provider Note   CSN: [id number] Arrival date & time: 04/09/18  0500     History   Chief Complaint Chief Complaint  Patient presents with  . Vascular Access Problem    HPI Ryan [name] is a 84 y.o. male.  84 yo M with a cc of bleeding from his picc line site.  Going on since this morning.  Blood noted on linens.  He has a PICC line for bacteremia which is assumed to be endocarditis.  He has had no issues with this.  No ongoing fevers.  Has been doing well.  Has had bleeding from the PICC line site once before.  The history is provided by the patient.  Illness  This is a recurrent problem. The current episode started 3 to 5 hours ago. The problem occurs constantly. The problem has not changed since onset.Pertinent negatives include no chest pain, no abdominal pain, no headaches and no shortness of breath. Nothing aggravates the symptoms. Nothing relieves the symptoms. He has tried nothing for the symptoms. The treatment provided no relief.    Past Medical History:  Diagnosis Date  . Age-related macular degeneration   . Allergic rhinitis   . 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 Arthritis    "hands, lower back" (04/30/2016)  . Asthma   . Bacterial endocarditis   . Carotid artery obstruction    a. s/p bilat CEA.  . Chronic combined systolic (congestive) and diastolic (congestive) heart failure ([place]) 03/18/2016  . Chronic lower back pain   . Claudication in peripheral vascular disease ([place]) 01/01/2018   Claudication  . Coronary arteriosclerosis in native artery    a. 2017 Cardiac catheterization demonstrated worsening CAD with 70% mid LCx, 80% OM1 and 80% mid RCA stenosis. b.  In 11/17, he underwent successful rotational atherectomy and DES to RCA.  Marland Kitchen Decreased diffusion capacity 10/23/2017  . Diverticulitis of colon   . DJD (degenerative joint disease)   . Elevated TSH 06/30/2017  . GERD (gastroesophageal reflux  disease)   . History of hiatal hernia   . HLD (hyperlipidemia)   . Hypertension   . Hypertensive heart disease with heart failure ([place])   . Intractable back pain 11/07/2017  . OSA (obstructive sleep apnea)    intolerant to CPAP  . PAF (paroxysmal atrial fibrillation) ([place])   . Paroxysmal supraventricular tachycardia ([place])   . Presence of permanent cardiac pacemaker   . Primary malignant neoplasm of bladder ([place])   . Prostate cancer ([place])   . 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 S/P AVR 12/02/2017  . Syncope 10/23/2017  . Tachy-brady syndrome ([place])   . Thrombocytopenia ([place]) 11/07/2017  . Urinary retention 11/07/2017    Patient Active Problem List   Diagnosis Date Noted  . Presence of permanent cardiac pacemaker 03/29/2018  . Bacteremia due to Enterococcus 03/29/2018  . Hypertensive urgency   . Unstable angina ([place]) 01/15/2018  . Claudication in peripheral vascular disease ([place]) 01/01/2018  . S/P AVR 12/02/2017  . Acute renal insufficiency 12/02/2017  . 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 Intractable back pain 11/07/2017  . Elevated troponin 11/07/2017  . Urinary retention 11/07/2017  . Hypertension   . Syncope 10/23/2017  . Decreased diffusion capacity 10/23/2017  . 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 Elevated TSH 06/30/2017  . Anticoagulation management encounter 08/19/2016  . CAD (coronary artery disease), native coronary artery 04/30/2016  . Tachy-brady syndrome ([place])   . Hypertensive  heart disease with heart failure ([place])   . Pure hypercholesterolemia   . PAF (paroxysmal atrial fibrillation) ([place]) 03/18/2016  . Chronic combined systolic (congestive) and diastolic (congestive) heart failure ([place]) 03/18/2016  . Atrial fibrillation with rapid ventricular response ([place]) 03/18/2016  . DOE (dyspnea on exertion) 01/17/2016  . Coronary artery disease of native artery of native heart with stable angina pectoris [facility])      Past Surgical History:  Procedure Laterality Date  . APPENDECTOMY    . CARDIAC CATHETERIZATION N/A 01/17/2016   Procedure: Right/Left Heart Cath and Coronary Angiography;  Surgeon: Belva Crome, MD;  Location: [facility] CV LAB;  Service: Cardiovascular;  Laterality: N/A;  . CARDIAC CATHETERIZATION N/A 04/30/2016   Procedure: Coronary/Graft Atherectomy;  Surgeon: Sherren Mocha, MD;  [place] [facility];  Service: Cardiovascular;  Laterality: N/A;  . CAROTID ENDARTERECTOMY Bilateral   . CATARACT EXTRACTION W/ INTRAOCULAR LENS  IMPLANT, BILATERAL Bilateral   . COLON SURGERY  1981   Meckles Diverticulum with volvulus  . CORONARY ANGIOGRAPHY N/A 01/18/2018   Procedure: CORONARY ANGIOGRAPHY;  Surgeon: Lorretta Harp, MD;  Location: [place] CV LAB;  Service: Cardiovascular;  Laterality: N/A;  . EP IMPLANTABLE DEVICE N/A 03/21/2016   Procedure: Pacemaker Implant;  Surgeon: Evans Lance, MD;  Location: [place] CV LAB;  Service: Cardiovascular;  Laterality: N/A;  . EYE SURGERY    . INGUINAL HERNIA REPAIR Right 2009  . INSERT / REPLACE / REMOVE PACEMAKER    . INSERTION PROSTATE RADIATION SEED    . TEE WITHOUT CARDIOVERSION N/A 06/10/2016   Procedure: TRANSESOPHAGEAL ECHOCARDIOGRAM (TEE);  Surgeon: Sherren Mocha, MD;  Location: [facility];  Service: Open Heart Surgery;  Laterality: N/A;  . TEE WITHOUT CARDIOVERSION N/A 11/10/2017   Procedure: TRANSESOPHAGEAL ECHOCARDIOGRAM (TEE);  Surgeon: Acie Fredrickson Wonda Cheng, MD;  Location: [facility] ENDOSCOPY;  Service: Cardiovascular;  Laterality: N/A;  . TEE WITHOUT CARDIOVERSION N/A 12/15/2017   Procedure: TRANSESOPHAGEAL ECHOCARDIOGRAM (TEE);  Surgeon: Buford Dresser, MD;  Location: [facility] ENDOSCOPY;  Service: Cardiovascular;  Laterality: N/A;  . TEE WITHOUT CARDIOVERSION N/A 03/30/2018   Procedure: TRANSESOPHAGEAL ECHOCARDIOGRAM (TEE);  Surgeon: Jerline Pain, MD;  Location: [facility] ENDOSCOPY;  Service: Cardiovascular;  Laterality: N/A;  .  TONSILLECTOMY  ~ 1947  . TRANSCATHETER AORTIC VALVE REPLACEMENT, TRANSFEMORAL N/A 06/10/2016   Procedure: TRANSCATHETER AORTIC VALVE REPLACEMENT, TRANSFEMORAL;  Surgeon: Sherren Mocha, MD;  Location: [place];  Service: Open Heart Surgery;  Laterality: N/A;        Home Medications    Prior to Admission medications   Medication Sig Start Date End Date Taking? Authorizing Provider  albuterol (PROVENTIL) (2.5 MG/3ML) 0.083% nebulizer solution Take 3 mLs (2.5 mg total) by nebulization every 8 (eight) hours as needed for wheezing or shortness of breath. 10/13/17   Nafziger, Tommi Rumps, NP  amLODipine (NORVASC) 5 MG tablet Take 1 tablet (5 mg total) by mouth daily. 01/20/18   Kroeger, Lorelee Cover., PA-C  ampicillin IVPB Inject 2 g into the vein every 8 (eight) hours. As a continuous infusion. Indication: PVE/PPM infection Last Day of Therapy:  May 10, 2018 Labs - Once weekly:  CBC/D and BMP, Labs - Every other week:  ESR and CRP 04/02/18 05/11/18  Thurnell Lose, MD  apixaban (ELIQUIS) 2.5 MG TABS tablet Take 1 tablet (2.5 mg total) by mouth 2 (two) times daily. 10/22/17   Evans Lance, MD  atorvastatin (LIPITOR) 40 MG tablet Take 1 tablet (40 mg total) by mouth daily at 6 PM.  01/19/18   Kroeger, Lorelee Cover., PA-C  budesonide-formoterol (SYMBICORT) 80-4.5 MCG/ACT inhaler Inhale 2 puffs into the lungs 2 (two) times daily. Patient taking differently: Inhale 2 puffs into the lungs daily.  10/20/17   Lauraine Rinne, NP  cefTRIAXone (ROCEPHIN) IVPB Inject 2 g into the vein every 12 (twelve) hours. Indication:  PVE/PPM infection Last Day of Therapy:  May 10, 2018 Labs - Once weekly:  CBC/D and BMP, Labs - Every other week:  ESR and CRP 04/02/18   Thurnell Lose, MD  cholecalciferol (VITAMIN D) 1000 units tablet Take 1,000 Units by mouth 2 (two) times a week.     [provider]  isosorbide mononitrate (IMDUR) 60 MG 24 hr tablet Take 1 tablet (60 mg total) by mouth daily. 01/20/18   Kroeger, Lorelee Cover., PA-C  lansoprazole (PREVACID) 15 MG capsule Take 1 capsule (15 mg total) by mouth daily at 12 noon. Patient taking differently: Take 15 mg by mouth daily as needed (heartburn.).  01/28/18   Dorothy Spark, MD  latanoprost (XALATAN) 0.005 % ophthalmic solution Place 1 drop into both eyes at bedtime.    [provider]  levothyroxine (SYNTHROID, LEVOTHROID) 25 MCG tablet Take 0.5 tablets (12.5 mcg total) by mouth daily before breakfast. 10/21/17   Dorothy Spark, MD  menthol-cetylpyridinium (CEPACOL) 3 MG lozenge Take 1 lozenge (3 mg total) by mouth as needed for sore throat. 10/24/17   Lavina Hamman, MD  metoprolol succinate (TOPROL-XL) 100 MG 24 hr tablet Take 1 tablet (100 mg total) by mouth daily. Take with or immediately following a meal. 01/20/18   Kroeger, Daleen Snook M., PA-C  Netarsudil Dimesylate (RHOPRESSA) 0.02 % SOLN Place 1 drop into both eyes at bedtime.    [provider]  nitroGLYCERIN (NITROSTAT) 0.4 MG SL tablet Place 1 tablet (0.4 mg total) under the tongue every 5 (five) minutes x 3 doses as needed for chest pain. 01/19/18   Kroeger, Lorelee Cover., PA-C  polyethylene glycol (MIRALAX / GLYCOLAX) packet Take 17 g by mouth daily. Patient taking differently: Take 17 g by mouth daily as needed for moderate constipation.  11/14/17   Bonnell Public, MD  senna (SENOKOT) 8.6 MG TABS tablet Take 1 tablet (8.6 mg total) by mouth daily as needed for mild constipation. 11/14/17   Bonnell Public, MD  vitamin B-12 (CYANOCOBALAMIN) 1000 MCG tablet Take 1,000 mcg by mouth daily.    [provider]    Family History Family History  Problem Relation Age of Onset  . Heart disease Mother   . Heart disease Father     Social History Social History   Tobacco Use  . Smoking status: Never Smoker  . Smokeless tobacco: Never Used  Substance Use Topics  . Alcohol use: No    Alcohol/week: 0.0 standard drinks    Comment: 04/30/2016 "nothing in years"  . Drug  use: No     Allergies   Ciprofloxacin; Hydrochlorothiazide; Sulfa antibiotics; Ace inhibitors; Losartan; Carvedilol; Lisinopril; and Soy allergy   Review of Systems Review of Systems  Constitutional: Negative for chills and fever.  HENT: Negative for congestion and facial swelling.   Eyes: Negative for discharge and visual disturbance.  Respiratory: Negative for shortness of breath.   Cardiovascular: Negative for chest pain and palpitations.  Gastrointestinal: Negative for abdominal pain, diarrhea and vomiting.  Musculoskeletal: Negative for arthralgias and myalgias.  Skin: Negative for color change and rash.  Neurological: Negative for tremors, syncope and headaches.  Psychiatric/Behavioral:  Negative for confusion and dysphoric mood.     Physical Exam Updated Vital Signs BP (!) 147/60 (BP Location: Left Arm)   Pulse 61   Temp (!) 97.3 F (36.3 C) (Oral)   Resp 18   Ht _0  (1.651 m)   Wt 56.7 kg   SpO2 95%   BMI 20.80 kg/m   Physical Exam  Constitutional: He is oriented to person, place, and time. He appears well-developed and well-nourished.  HENT:  Head: Normocephalic and atraumatic.  Eyes: Pupils are equal, round, and reactive to light. EOM are normal.  Neck: Normal range of motion. Neck supple. No JVD present.  Cardiovascular: Normal rate and regular rhythm. Exam reveals no gallop and no friction rub.  No murmur heard. Pulmonary/Chest: No respiratory distress. He has no wheezes.  Abdominal: He exhibits no distension. There is no rebound and no guarding.  Musculoskeletal: Normal range of motion.  Blood under Teg[place] of right picc line site.  No noted bleeding at insertion site of line.    Neurological: He is alert and oriented to person, place, and time.  Skin: No rash noted. No pallor.  Psychiatric: He has a normal mood and affect. His behavior is normal.  Nursing note and vitals reviewed.    ED Treatments / Results  Labs (all labs ordered are listed,  but only abnormal results are displayed) Labs Reviewed - No data to display  EKG None  Radiology No results found.  Procedures Procedures (including critical care time)  Medications Ordered in ED Medications - No data to display   Initial Impression / Assessment and Plan / ED Course  I have reviewed the triage vital signs and the nursing notes.  Pertinent labs & imaging results that were available during my care of the patient were reviewed by me and considered in my medical decision making (see chart for details).     84 yo M with a chief complaints of bleeding from his PICC line site.  No active bleeding from what I can see, IV team was consulted to redressed the site.  Cleaned and redressed, no continued bleeding.  D/c home.   6:18 AM:  I have discussed the diagnosis/risks/treatment options with the patient and family and believe the pt to be eligible for discharge home to follow-up with PCP. We also discussed returning to the ED immediately if new or worsening sx occur. We discussed the sx which are most concerning (e.g., sudden worsening pain, fever, inability to tolerate by mouth) that necessitate immediate return. Medications administered to the patient during their visit and any new prescriptions provided to the patient are listed below.  Medications given during this visit Medications - No data to display    The patient appears reasonably screen and/or stabilized for discharge and I doubt any other medical condition or other [facility] requiring further screening, evaluation, or treatment in the ED at this time prior to discharge.    Final Clinical Impressions(s) / ED Diagnoses   Final diagnoses:  Bleeding from PICC line, initial encounter [facility])    ED Discharge Orders    None       Deno Etienne, DO 04/09/18 [phone]

## 2018-04-12 ENCOUNTER — Ambulatory Visit (INDEPENDENT_AMBULATORY_CARE_PROVIDER_SITE_OTHER): Payer: Medicare Other | Admitting: *Deleted

## 2018-04-12 DIAGNOSIS — I495 Sick sinus syndrome: Secondary | ICD-10-CM

## 2018-04-12 DIAGNOSIS — T826XXA Infection and inflammatory reaction due to cardiac valve prosthesis, initial encounter: Secondary | ICD-10-CM | POA: Diagnosis not present

## 2018-04-12 DIAGNOSIS — I429 Cardiomyopathy, unspecified: Secondary | ICD-10-CM

## 2018-04-13 ENCOUNTER — Encounter: Payer: Self-pay | Admitting: Family Medicine

## 2018-04-13 NOTE — Progress Notes (Signed)
Remote pacemaker transmission.   

## 2018-04-15 ENCOUNTER — Ambulatory Visit: Payer: Self-pay

## 2018-04-15 NOTE — Telephone Encounter (Signed)
Pt's wife called to report increased swelling of feet, ankles up to calf with left > right, and "puffiness around eyes and in his cheeks."   Reported the swelling has been intermittent, but has increased over the past 4-5 days.  Denied any swelling of his trunk or upper extremities.  Denied shortness of breath at rest; reported "slight shortness of breath with activity."  Denied any cough or chest discomfort.  Denied redness or warmth of lower extremities.  Denied pain.  Denied fever/ chills; temp 98.3 this morning.  Stated the pt. has not been able to schedule his hosp. F/u appt.; "trying to get him in better shape to go to an appt."  Reported on IV infusions of Ampicillin and Rocephin TID, and requested to work around his dose times, in scheduling an appt.    Called [facility]; spoke with Rachel Bo; okay'd to sched. 30 min. appt. tomorrow, in place of 2 same day slots.  Offered/ sched. appt. tomorrow at 11:00 AM; wife agreed.  Care advice given per protocol.  Instructed on worsening symptoms to watch for, that would be indicative he would need to go to the ER.  Wife verb. understanding.           Reason for Disposition . [1] MODERATE leg swelling (e.g., swelling extends up to knees) AND [2] new onset or worsening  Answer Assessment - Initial Assessment Questions 1. ONSET: "When did the swelling start?" (e.g., minutes, hours, days)     Over past 4 days; off and on; wears compression socks 2. LOCATION: "What part of the leg is swollen?"  "Are both legs swollen or just one leg?"     Feet and ankles up to calf; left > right 3. SEVERITY: "How bad is the swelling?" (e.g., localized; mild, moderate, severe)  - Localized - small area of swelling localized to one leg  - MILD pedal edema - swelling limited to foot and ankle, pitting edema < 1/4 inch (6 mm) deep, rest and elevation eliminate most or all swelling  - MODERATE edema - swelling of lower leg to knee, pitting edema > 1/4 inch (6 mm) deep, rest and elevation  only partially reduce swelling  - SEVERE edema - swelling extends above knee, facial or hand swelling present      moderate 4. REDNESS: "Does the swelling look red or infected?"      Denied redness 5. PAIN: "Is the swelling painful to touch?" If so, ask: "How painful is it?"   (Scale 1-10; mild, moderate or severe)     Denied pain 6. FEVER: "Do you have a fever?" If so, ask: "What is it, how was it measured, and when did it start?"      Denied fever; temp. 98.3 7. CAUSE: "What do you think is causing the leg swelling?"    Unknown 8. MEDICAL HISTORY: "Do you have a history of heart failure, kidney disease, liver failure, or cancer?"     Endocarditis, kidney function is "low"; denied liver failure or cancer 9. RECURRENT SYMPTOM: "Have you had leg swelling before?" If so, ask: "When was the last time?" "What happened that time?"    Yes; getting worse over past 4-5 days 10. OTHER SYMPTOMS: "Do you have any other symptoms?" (e.g., chest pain, difficulty breathing)       Slight shortness of breath with activity; denied c/o chest discomfort; denied cough; some swelling in face around eyes and cheeks  Protocols used: LEG SWELLING AND EDEMA-A-AH

## 2018-04-16 ENCOUNTER — Other Ambulatory Visit: Payer: Self-pay | Admitting: Adult Health

## 2018-04-16 ENCOUNTER — Encounter: Payer: Self-pay | Admitting: Adult Health

## 2018-04-16 ENCOUNTER — Ambulatory Visit: Payer: Medicare Other | Admitting: Adult Health

## 2018-04-16 VITALS — BP 144/60 | HR 77 | Temp 97.5°F | Wt 137.0 lb

## 2018-04-16 DIAGNOSIS — R600 Localized edema: Secondary | ICD-10-CM | POA: Diagnosis not present

## 2018-04-16 MED ORDER — ACETAMINOPHEN-CODEINE #3 300-30 MG PO TABS: 0.5000 | ORAL_TABLET | ORAL | 0 refills | Status: DC | PRN

## 2018-04-16 NOTE — Progress Notes (Signed)
Subjective:    Patient ID: Ryan [name], male    DOB: 1930-04-07, 84 y.o.   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), Arthritis, Asthma, Bacterial endocarditis, Carotid artery obstruction, Chronic combined systolic (congestive) and diastolic (congestive) heart failure (HCC) (03/18/2016), Chronic lower back pain, Claudication in peripheral vascular disease ([place]) (01/01/2018), Coronary arteriosclerosis in native artery, Decreased diffusion capacity (10/23/2017), Diverticulitis of colon, DJD (degenerative joint disease), Elevated TSH (06/30/2017), GERD (gastroesophageal reflux disease), History of hiatal hernia, HLD (hyperlipidemia), Hypertension, Hypertensive heart disease with heart failure ([place]), Intractable back pain (11/07/2017), OSA (obstructive sleep apnea), PAF (paroxysmal atrial fibrillation) ([place]), Paroxysmal supraventricular tachycardia ([place]), Presence of permanent cardiac pacemaker, Primary malignant neoplasm of bladder ([place]), Prostate cancer ([place]), 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 His wife is with him at this visit.   Complaint today is that of lower extremity edema.  He has had some chronic edema for some time now but feels as though over the last 10 days to 2 weeks the lower extremity edema has been becoming worse.  Reports that she feels as though one day lower extremity edema might be in his right leg and then the next day his left leg is more swollen.  He has been using his compression socks and elevating his legs at night but does not feel that this is helping as much as it once did. His wife reports that she gave him 20 mg Lasix this week that they had leftover and that this did not cause him to urinate any more  than he normally does.  Denies lower extremity pain, redness, or warmth.  Does not feel any more short of breath past baseline and does not have any chest pain.   [facility] for recurrent bioprosthetic AV valve endocarditis.  Currently doing antibiotics at home via PICC line.  He continues to feel fatigued since his hospital stay but feels as though he is getting stronger every day.   Review of Systems See HPI   Past Medical History:  Diagnosis Date  . Age-related macular degeneration   . Allergic rhinitis   . 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 Arthritis    "hands, lower back" (04/30/2016)  . Asthma   . Bacterial endocarditis   . Carotid artery obstruction    a. s/p bilat CEA.  . Chronic combined systolic (congestive) and diastolic (congestive) heart failure ([place]) 03/18/2016  . Chronic lower back pain   . Claudication in peripheral vascular disease ([place]) 01/01/2018   Claudication  . Coronary arteriosclerosis in native artery    a. 2017 Cardiac catheterization demonstrated worsening CAD with 70% mid LCx, 80% OM1 and 80% mid RCA stenosis. b.  In 11/17, he underwent successful rotational atherectomy and DES to RCA.  Marland Kitchen Decreased diffusion capacity 10/23/2017  . Diverticulitis of colon   . DJD (degenerative joint disease)   . Elevated TSH 06/30/2017  . GERD (gastroesophageal reflux disease)   . History of hiatal hernia   . HLD (hyperlipidemia)   . Hypertension   . Hypertensive heart disease with heart failure ([place])   . Intractable back pain 11/07/2017  . OSA (obstructive sleep apnea)    intolerant to CPAP  . PAF (paroxysmal atrial fibrillation) ([place])   . Paroxysmal supraventricular tachycardia ([place])   . Presence of  permanent cardiac pacemaker   . Primary malignant neoplasm of bladder ([place])   . Prostate cancer ([place])   . Prosthetic valve endocarditis ([place]) 11/10/2017   enterococcal bacteremia  . S/P AVR 12/02/2017  . Syncope 10/23/2017  .  Tachy-brady syndrome ([place])   . Thrombocytopenia ([place]) 11/07/2017  . Urinary retention 11/07/2017    Social History   Socioeconomic History  . Marital status: Married    Spouse name: Not on file  . Number of children: Not on file  . Years of education: Not on file  . Highest education level: Not on file  Occupational History  . Occupation: retired  Scientific laboratory technician  . Financial resource strain: Not on file  . Food insecurity:    Worry: Not on file    Inability: Not on file  . Transportation needs:    Medical: Not on file    Non-medical: Not on file  Tobacco Use  . Smoking status: Never Smoker  . Smokeless tobacco: Never Used  Substance and Sexual Activity  . Alcohol use: No    Alcohol/week: 0.0 standard drinks    Comment: 04/30/2016 "nothing in years"  . Drug use: No  . Sexual activity: Never  Lifestyle  . Physical activity:    Days per week: Not on file    Minutes per session: Not on file  . Stress: Not on file  Relationships  . Social connections:    Talks on phone: Not on file    Gets together: Not on file    Attends religious service: Not on file    Active member of club or organization: Not on file    Attends meetings of clubs or organizations: Not on file    Relationship status: Not on file  . Intimate partner violence:    Fear of current or ex partner: Not on file    Emotionally abused: Not on file    Physically abused: Not on file    Forced sexual activity: Not on file  Other Topics Concern  . Not on file  Social History Narrative   Retired    Three children - One in [place], One in [place], and one in [place].        Past Surgical History:  Procedure Laterality Date  . APPENDECTOMY    . CARDIAC CATHETERIZATION N/A 01/17/2016   Procedure: Right/Left Heart Cath and Coronary Angiography;  Surgeon: Belva Crome, MD;  Location: [facility];  Service: Cardiovascular;  Laterality: N/A;  . CARDIAC CATHETERIZATION N/A 04/30/2016   Procedure: Coronary/Graft  Atherectomy;  Surgeon: Sherren Mocha, MD;  Location: [facility];  Service: Cardiovascular;  Laterality: N/A;  . CAROTID ENDARTERECTOMY Bilateral   . CATARACT EXTRACTION W/ INTRAOCULAR LENS  IMPLANT, BILATERAL Bilateral   . COLON SURGERY  1981   Meckles Diverticulum with volvulus  . CORONARY ANGIOGRAPHY N/A 01/18/2018   Procedure: CORONARY ANGIOGRAPHY;  Surgeon: Lorretta Harp, MD;  Location: [facility];  Service: Cardiovascular;  Laterality: N/A;  . EP IMPLANTABLE DEVICE N/A 03/21/2016   Procedure: Pacemaker Implant;  Surgeon: Evans Lance, MD;  Location: [facility];  Service: Cardiovascular;  Laterality: N/A;  . EYE SURGERY    . INGUINAL HERNIA REPAIR Right 2009  . INSERT / REPLACE / REMOVE PACEMAKER    . INSERTION PROSTATE RADIATION SEED    . TEE WITHOUT CARDIOVERSION N/A 06/10/2016   Procedure: TRANSESOPHAGEAL ECHOCARDIOGRAM (TEE);  Surgeon: Sherren Mocha, MD;  Location: [facility];  Service: Open  Heart Surgery;  Laterality: N/A;  . TEE WITHOUT CARDIOVERSION N/A 11/10/2017   Procedure: TRANSESOPHAGEAL ECHOCARDIOGRAM (TEE);  Surgeon: Acie Fredrickson Wonda Cheng, MD;  Location: [facility] ENDOSCOPY;  Service: Cardiovascular;  Laterality: N/A;  . TEE WITHOUT CARDIOVERSION N/A 12/15/2017   Procedure: TRANSESOPHAGEAL ECHOCARDIOGRAM (TEE);  Surgeon: Buford Dresser, MD;  Location: [facility] ENDOSCOPY;  Service: Cardiovascular;  Laterality: N/A;  . TEE WITHOUT CARDIOVERSION N/A 03/30/2018   Procedure: TRANSESOPHAGEAL ECHOCARDIOGRAM (TEE);  Surgeon: Jerline Pain, MD;  Location: [facility] ENDOSCOPY;  Service: Cardiovascular;  Laterality: N/A;  . TONSILLECTOMY  ~ 1947  . TRANSCATHETER AORTIC VALVE REPLACEMENT, TRANSFEMORAL N/A 06/10/2016   Procedure: TRANSCATHETER AORTIC VALVE REPLACEMENT, TRANSFEMORAL;  Surgeon: Sherren Mocha, MD;  Location: [facility];  Service: Open Heart Surgery;  Laterality: N/A;    Family History  Problem Relation Age of Onset  . Heart disease Mother   . Heart disease Father      Allergies  Allergen Reactions  . Ciprofloxacin Anaphylaxis  . Hydrochlorothiazide Anaphylaxis  . Sulfa Antibiotics Anaphylaxis  . Ace Inhibitors Cough  . Losartan Cough  . Carvedilol Cough    Pt states it "causes him too cough."  . Lisinopril Cough    Pt reports worsening cough and "feeling wheezy."  . Soy Allergy Nausea And Vomiting    Current Outpatient Medications on File Prior to Visit  Medication Sig Dispense Refill  . albuterol (PROVENTIL) (2.5 MG/3ML) 0.083% nebulizer solution Take 3 mLs (2.5 mg total) by nebulization every 8 (eight) hours as needed for wheezing or shortness of breath. 50 mL 0  . amLODipine (NORVASC) 5 MG tablet Take 1 tablet (5 mg total) by mouth daily. 30 tablet 3  . ampicillin IVPB Inject 2 g into the vein every 8 (eight) hours. As a continuous infusion. Indication: PVE/PPM infection Last Day of Therapy:  May 10, 2018 Labs - Once weekly:  CBC/D and BMP, Labs - Every other week:  ESR and CRP 117 Units 0  . apixaban (ELIQUIS) 2.5 MG TABS tablet Take 1 tablet (2.5 mg total) by mouth 2 (two) times daily. 180 tablet 3  . atorvastatin (LIPITOR) 40 MG tablet Take 1 tablet (40 mg total) by mouth daily at 6 PM. 90 tablet 3  . budesonide-formoterol (SYMBICORT) 80-4.5 MCG/ACT inhaler Inhale 2 puffs into the lungs 2 (two) times daily. (Patient taking differently: Inhale 2 puffs into the lungs daily. ) 1 Inhaler 0  . cefTRIAXone (ROCEPHIN) IVPB Inject 2 g into the vein every 12 (twelve) hours. Indication:  PVE/PPM infection Last Day of Therapy:  May 10, 2018 Labs - Once weekly:  CBC/D and BMP, Labs - Every other week:  ESR and CRP 78 Units 0  . cholecalciferol (VITAMIN D) 1000 units tablet Take 1,000 Units by mouth 2 (two) times a week.     . isosorbide mononitrate (IMDUR) 60 MG 24 hr tablet Take 1 tablet (60 mg total) by mouth daily. 30 tablet 3  . lansoprazole (PREVACID) 15 MG capsule Take 1 capsule (15 mg total) by mouth daily at 12 noon. (Patient taking  differently: Take 15 mg by mouth daily as needed (heartburn.). ) 30 capsule 6  . latanoprost (XALATAN) 0.005 % ophthalmic solution Place 1 drop into both eyes at bedtime.    Marland Kitchen levothyroxine (SYNTHROID, LEVOTHROID) 25 MCG tablet Take 0.5 tablets (12.5 mcg total) by mouth daily before breakfast. 45 tablet 3  . menthol-cetylpyridinium (CEPACOL) 3 MG lozenge Take 1 lozenge (3 mg total) by mouth as needed for sore throat. 100 tablet 12  .  metoprolol succinate (TOPROL-XL) 100 MG 24 hr tablet Take 1 tablet (100 mg total) by mouth daily. Take with or immediately following a meal. 90 tablet 3  . Netarsudil Dimesylate (RHOPRESSA) 0.02 % SOLN Place 1 drop into both eyes at bedtime.    . nitroGLYCERIN (NITROSTAT) 0.4 MG SL tablet Place 1 tablet (0.4 mg total) under the tongue every 5 (five) minutes x 3 doses as needed for chest pain. 25 tablet 3  . polyethylene glycol (MIRALAX / GLYCOLAX) packet Take 17 g by mouth daily. (Patient taking differently: Take 17 g by mouth daily as needed for moderate constipation. ) 14 each 0  . senna (SENOKOT) 8.6 MG TABS tablet Take 1 tablet (8.6 mg total) by mouth daily as needed for mild constipation. 120 each 0  . vitamin B-12 (CYANOCOBALAMIN) 1000 MCG tablet Take 1,000 mcg by mouth daily.     No current facility-administered medications on file prior to visit.     BP (!) 144/60   Pulse 77   Temp (!) 97.5 F (36.4 C)   Wt 137 lb (62.1 kg)   SpO2 97%   BMI 22.80 kg/m        Objective:   Physical Exam  Constitutional: He is oriented to person, place, and time. He appears well-developed and well-nourished. No distress.  Cardiovascular: Normal rate, regular rhythm, normal heart sounds and intact distal pulses.  Pulmonary/Chest: Effort normal and breath sounds normal.  Musculoskeletal: He exhibits edema. He exhibits no tenderness or deformity.  +2 pitting edema left lower extremity from ankle to mid calf. No pain with palpation. No redness or warmth noted. Trace  pitting edema in right lower ankle.   Slow steady gait with single prong cane  Neurological: He is alert and oriented to person, place, and time.  Skin: Skin is warm. He is not diaphoretic.  Nursing note and vitals reviewed.     Assessment & Plan:  1. Lower extremity edema - likely due to decreased kidney function. His baseline kidney function now is Cr. 2.2 with GFR 25-30. Not concerned for DVT - Will not start on diuretic at this time. I would like him to be seen by his cardiologist first - Continue to use compression socks and elevate legs above heart when at rest.  - Patient and family in agreement with this plan

## 2018-04-19 DIAGNOSIS — T826XXA Infection and inflammatory reaction due to cardiac valve prosthesis, initial encounter: Secondary | ICD-10-CM | POA: Diagnosis not present

## 2018-04-20 ENCOUNTER — Encounter: Payer: Self-pay | Admitting: Cardiology

## 2018-04-20 ENCOUNTER — Ambulatory Visit: Payer: Medicare Other | Admitting: Cardiology

## 2018-04-20 VITALS — BP 150/62 | HR 65 | Ht 65.0 in | Wt 137.0 lb

## 2018-04-20 DIAGNOSIS — I480 Paroxysmal atrial fibrillation: Secondary | ICD-10-CM | POA: Diagnosis not present

## 2018-04-20 DIAGNOSIS — I2510 Atherosclerotic heart disease of native coronary artery without angina pectoris: Secondary | ICD-10-CM | POA: Diagnosis not present

## 2018-04-20 DIAGNOSIS — I38 Endocarditis, valve unspecified: Secondary | ICD-10-CM

## 2018-04-20 DIAGNOSIS — T826XXD Infection and inflammatory reaction due to cardiac valve prosthesis, subsequent encounter: Secondary | ICD-10-CM | POA: Diagnosis not present

## 2018-04-20 DIAGNOSIS — I110 Hypertensive heart disease with heart failure: Secondary | ICD-10-CM | POA: Diagnosis not present

## 2018-04-20 DIAGNOSIS — Z950 Presence of cardiac pacemaker: Secondary | ICD-10-CM

## 2018-04-20 LAB — COMPREHENSIVE METABOLIC PANEL
ALT: 10 IU/L (ref 0–44)
AST: 18 IU/L (ref 0–40)
Albumin/Globulin Ratio: 2.2 (ref 1.2–2.2)
Albumin: 4.1 g/dL (ref 3.5–4.7)
Alkaline Phosphatase: 61 IU/L (ref 39–117)
BUN/Creatinine Ratio: 16 (ref 10–24)
BUN: 46 mg/dL — ABNORMAL HIGH (ref 8–27)
Bilirubin Total: <0.2 mg/dL (ref 0.0–1.2)
CO2: 21 mmol/L (ref 20–29)
Calcium: 9.2 mg/dL (ref 8.6–10.2)
Chloride: 104 mmol/L (ref 96–106)
Creatinine, Ser: 2.89 mg/dL — ABNORMAL HIGH (ref 0.76–1.27)
GFR calc Af Amer: 22 mL/min/{1.73_m2} — ABNORMAL LOW (ref >59)
GFR calc non Af Amer: 19 mL/min/{1.73_m2} — ABNORMAL LOW (ref >59)
Globulin, Total: 1.9 g/dL (ref 1.5–4.5)
Glucose: 83 mg/dL (ref 65–99)
Potassium: 4.2 mmol/L (ref 3.5–5.2)
Sodium: 144 mmol/L (ref 134–144)
Total Protein: 6 g/dL (ref 6.0–8.5)

## 2018-04-20 LAB — CBC WITH DIFFERENTIAL/PLATELET
Basophils Absolute: 0.1 10*3/uL (ref 0.0–0.2)
Basos: 1 %
EOS (ABSOLUTE): 0.2 10*3/uL (ref 0.0–0.4)
Eos: 3 %
Hematocrit: 26.1 % — ABNORMAL LOW (ref 37.5–51.0)
Hemoglobin: 8.3 g/dL — ABNORMAL LOW (ref 13.0–17.7)
Immature Grans (Abs): 0.1 10*3/uL (ref 0.0–0.1)
Immature Granulocytes: 1 %
Lymphocytes Absolute: 1 10*3/uL (ref 0.7–3.1)
Lymphs: 17 %
MCH: 30.1 pg (ref 26.6–33.0)
MCHC: 31.8 g/dL (ref 31.5–35.7)
MCV: 95 fL (ref 79–97)
Monocytes Absolute: 0.8 10*3/uL (ref 0.1–0.9)
Monocytes: 14 %
Neutrophils Absolute: 3.6 10*3/uL (ref 1.4–7.0)
Neutrophils: 64 %
Platelets: 206 10*3/uL (ref 150–450)
RBC: 2.76 x10E6/uL — ABNORMAL LOW (ref 4.14–5.80)
RDW: 12.9 % (ref 12.3–15.4)
WBC: 5.7 10*3/uL (ref 3.4–10.8)

## 2018-04-20 MED ORDER — FUROSEMIDE 40 MG PO TABS: 40.0000 mg | ORAL_TABLET | Freq: Every day | ORAL | 2 refills | Status: DC

## 2018-04-20 NOTE — Progress Notes (Signed)
Cardiology Office Note    Date:  04/20/2018  ID:  [name], DOB 1930/05/01, MRN [id number] PCP:  Dorothyann Peng, NP  Cardiologist:  Ena Dawley, MD   Chief Complaint: Follow up for prosthetic aortic valve endocarditis  History of Present Illness:  Ryan [name] a very pleasant73 year old Caucasian male with past medical history significant for carotid artery disease status post bilateral CEA, CAD status post PTCA/DES to RCA 04/2016, aortic stenosis status post TAVR 06/2016, HTN, HLD, prior SVT, OSA, chronic combined CHF, cardiomyopathy status post PPM, paroxysmal atrial fibrillation on Eliquis who presented to ED on 11/07/2017 with progressive left lower back pain of 3 days duration not relieved by pain medications.Patient was 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 He was doing better until 3 days prior to admission when he developed worsening left low back pain and profound fatigue.    He was admitted on November 07, 2017 and diagnosed with eterococcal bacteremia withprosthetic valve endocarditis.  There was a large vegetation seen on bioprosthetic aortic valve, no vegetation was seen on pacemaker leads. Patient has been on IV antibiotics IV ampicillin and Rocephin for the endocarditis.  Patient was discharged to skilled nursing facility to complete course of antibiotics, completed on December 22, 2017 During the hospital stay, the patient developed an acute kidney injury believed to be secondary to dehydration and possible endocarditis embolization. Patient's intractable back pain was further evaluated, CT showed spondylosis, mild to moderate multilevel foraminal stenosis, pars defect L 4-5.   Neurosurgery reassessed on 6/11 and indicatedthat his acute onset of back pain is due to 1 of 3 possibilities: Exacerbation of chronic lumbar spondylosis, spontaneous spinal EDH related to Eliquis or discitis. He  recommends nonoperative treatment, continue antibiotics for his bacteremia and if stroke risk felt to be high without Eliquis then consider resuming it.   01/27/2018 -the patient was readmitted on January 15, 2018 for complaint of like of energy and chest pain with elevated troponin.  Coronary CTA on August 16 showed widely patent mid RCA stent the remainder of anatomy and change, the etiology of his chest pain is not supported by any change in his coronary anatomy and medical therapy was recommended.  He was treated for acute combined systolic and diastolic heart failure.  Discharged on August 20 has 2019. Today he states that he has more energy, he finally feels little better, alternates exercises with naps during the day, denies any chest pain other than heartburn at night, no lower extremity edema, no orthopnea, paroxmal nocturnal dyspnea no fevers.  04/20/2018 -patient was readmitted on October 28 with recurrent fevers, he has been started on IV ampicillin and Rocephin, his leukocytosis was improved and he appears nontoxic, TEE did not show any recurrent aortic valve endocarditis.  He is blood cultures are negative.  He received PICC line to his right arm on April 01, 2018 and will receive IV penicillin with Rocephin for 6 weeks.  He is supposed to follow with ID service as well.  Today he denies any fevers or chills,, he has been compliant with all his medications and antibiotics, he has some bleeding from the PICC line occasionally, he has a visiting nurse that checks on that.  Has noticed worsening lower extremity edema but no orthopnea proximal nocturnal dyspnea.  Past Medical History:  Diagnosis Date  . Age-related macular degeneration   . Allergic rhinitis   . 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 Arthritis    "  hands, lower back" (04/30/2016)  . Asthma   . Bacterial endocarditis   . Carotid artery obstruction    a. s/p bilat CEA.  . Chronic combined systolic (congestive) and  diastolic (congestive) heart failure ([place]) 03/18/2016  . Chronic lower back pain   . Claudication in peripheral vascular disease ([place]) 01/01/2018   Claudication  . Coronary arteriosclerosis in native artery    a. 2017 Cardiac catheterization demonstrated worsening CAD with 70% mid LCx, 80% OM1 and 80% mid RCA stenosis. b.  In 11/17, he underwent successful rotational atherectomy and DES to RCA.  Marland Kitchen Decreased diffusion capacity 10/23/2017  . Diverticulitis of colon   . DJD (degenerative joint disease)   . Elevated TSH 06/30/2017  . GERD (gastroesophageal reflux disease)   . History of hiatal hernia   . HLD (hyperlipidemia)   . Hypertension   . Hypertensive heart disease with heart failure ([place])   . Intractable back pain 11/07/2017  . OSA (obstructive sleep apnea)    intolerant to CPAP  . PAF (paroxysmal atrial fibrillation) ([place])   . Paroxysmal supraventricular tachycardia ([place])   . Presence of permanent cardiac pacemaker   . Primary malignant neoplasm of bladder ([place])   . Prostate cancer ([place])   . 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 S/P AVR 12/02/2017  . Syncope 10/23/2017  . Tachy-brady syndrome ([place])   . Thrombocytopenia ([place]) 11/07/2017  . Urinary retention 11/07/2017    Past Surgical History:  Procedure Laterality Date  . APPENDECTOMY    . CARDIAC CATHETERIZATION N/A 01/17/2016   Procedure: Right/Left Heart Cath and Coronary Angiography;  Surgeon: Belva Crome, MD;  Location: [facility];  Service: Cardiovascular;  Laterality: N/A;  . CARDIAC CATHETERIZATION N/A 04/30/2016   Procedure: Coronary/Graft Atherectomy;  Surgeon: Sherren Mocha, MD;  Location: [facility];  Service: Cardiovascular;  Laterality: N/A;  . CAROTID ENDARTERECTOMY Bilateral   . CATARACT EXTRACTION W/ INTRAOCULAR LENS  IMPLANT, BILATERAL Bilateral   . COLON SURGERY  1981   Meckles Diverticulum with volvulus  . CORONARY ANGIOGRAPHY N/A 01/18/2018   Procedure:  CORONARY ANGIOGRAPHY;  Surgeon: Lorretta Harp, MD;  Location: [facility];  Service: Cardiovascular;  Laterality: N/A;  . EP IMPLANTABLE DEVICE N/A 03/21/2016   Procedure: Pacemaker Implant;  Surgeon: Evans Lance, MD;  Location: [facility];  Service: Cardiovascular;  Laterality: N/A;  . EYE SURGERY    . INGUINAL HERNIA REPAIR Right 2009  . INSERT / REPLACE / REMOVE PACEMAKER    . INSERTION PROSTATE RADIATION SEED    . TEE WITHOUT CARDIOVERSION N/A 06/10/2016   Procedure: TRANSESOPHAGEAL ECHOCARDIOGRAM (TEE);  Surgeon: Sherren Mocha, MD;  Location: [facility];  Service: Open Heart Surgery;  Laterality: N/A;  . TEE WITHOUT CARDIOVERSION N/A 11/10/2017   Procedure: TRANSESOPHAGEAL ECHOCARDIOGRAM (TEE);  Surgeon: Acie Fredrickson Wonda Cheng, MD;  Location: [facility] ENDOSCOPY;  Service: Cardiovascular;  Laterality: N/A;  . TEE WITHOUT CARDIOVERSION N/A 12/15/2017   Procedure: TRANSESOPHAGEAL ECHOCARDIOGRAM (TEE);  Surgeon: Buford Dresser, MD;  Location: [facility] ENDOSCOPY;  Service: Cardiovascular;  Laterality: N/A;  . TEE WITHOUT CARDIOVERSION N/A 03/30/2018   Procedure: TRANSESOPHAGEAL ECHOCARDIOGRAM (TEE);  Surgeon: Jerline Pain, MD;  Location: [facility] ENDOSCOPY;  Service: Cardiovascular;  Laterality: N/A;  . TONSILLECTOMY  ~ 1947  . TRANSCATHETER AORTIC VALVE REPLACEMENT, TRANSFEMORAL N/A 06/10/2016   Procedure: TRANSCATHETER AORTIC VALVE REPLACEMENT, TRANSFEMORAL;  Surgeon: Sherren Mocha, MD;  Location: [facility];  Service: Open Heart Surgery;  Laterality:  N/A;    Current Medications: Current Meds  Medication Sig  . acetaminophen-codeine (TYLENOL #3) 300-30 MG tablet Take 0.5-1 tablets by mouth every 4 (four) hours as needed for moderate pain.  Marland Kitchen albuterol (PROVENTIL) (2.5 MG/3ML) 0.083% nebulizer solution Take 3 mLs (2.5 mg total) by nebulization every 8 (eight) hours as needed for wheezing or shortness of breath.  Marland Kitchen amLODipine (NORVASC) 5 MG tablet Take 1 tablet (5 mg total) by mouth daily.  Marland Kitchen  ampicillin IVPB Inject 2 g into the vein every 8 (eight) hours. As a continuous infusion. Indication: PVE/PPM infection Last Day of Therapy:  May 10, 2018 Labs - Once weekly:  CBC/D and BMP, Labs - Every other week:  ESR and CRP  . apixaban (ELIQUIS) 2.5 MG TABS tablet Take 1 tablet (2.5 mg total) by mouth 2 (two) times daily.  Marland Kitchen atorvastatin (LIPITOR) 40 MG tablet Take 1 tablet (40 mg total) by mouth daily at 6 PM.  . budesonide-formoterol (SYMBICORT) 80-4.5 MCG/ACT inhaler Inhale 2 puffs into the lungs 2 (two) times daily. (Patient taking differently: Inhale 2 puffs into the lungs daily. )  . cefTRIAXone (ROCEPHIN) IVPB Inject 2 g into the vein every 12 (twelve) hours. Indication:  PVE/PPM infection Last Day of Therapy:  May 10, 2018 Labs - Once weekly:  CBC/D and BMP, Labs - Every other week:  ESR and CRP  . cholecalciferol (VITAMIN D) 1000 units tablet Take 1,000 Units by mouth 2 (two) times a week.   . isosorbide mononitrate (IMDUR) 60 MG 24 hr tablet Take 1 tablet (60 mg total) by mouth daily.  . lansoprazole (PREVACID) 15 MG capsule Take 1 capsule (15 mg total) by mouth daily at 12 noon. (Patient taking differently: Take 15 mg by mouth daily as needed (heartburn.). )  . latanoprost (XALATAN) 0.005 % ophthalmic solution Place 1 drop into both eyes at bedtime.  Marland Kitchen levothyroxine (SYNTHROID, LEVOTHROID) 25 MCG tablet Take 0.5 tablets (12.5 mcg total) by mouth daily before breakfast.  . menthol-cetylpyridinium (CEPACOL) 3 MG lozenge Take 1 lozenge (3 mg total) by mouth as needed for sore throat.  . metoprolol succinate (TOPROL-XL) 100 MG 24 hr tablet Take 1 tablet (100 mg total) by mouth daily. Take with or immediately following a meal.  . Netarsudil Dimesylate (RHOPRESSA) 0.02 % SOLN Place 1 drop into both eyes at bedtime.  . nitroGLYCERIN (NITROSTAT) 0.4 MG SL tablet Place 1 tablet (0.4 mg total) under the tongue every 5 (five) minutes x 3 doses as needed for chest pain.  .  polyethylene glycol (MIRALAX / GLYCOLAX) packet Take 17 g by mouth daily. (Patient taking differently: Take 17 g by mouth daily as needed for moderate constipation. )  . senna (SENOKOT) 8.6 MG TABS tablet Take 1 tablet (8.6 mg total) by mouth daily as needed for mild constipation.  . vitamin B-12 (CYANOCOBALAMIN) 1000 MCG tablet Take 1,000 mcg by mouth daily.    Allergies:   Ciprofloxacin; Hydrochlorothiazide; Sulfa antibiotics; Ace inhibitors; Losartan; Carvedilol; Lisinopril; and Soy allergy   Social History   Socioeconomic History  . Marital status: Married    Spouse name: Not on file  . Number of children: Not on file  . Years of education: Not on file  . Highest education level: Not on file  Occupational History  . Occupation: retired  Scientific laboratory technician  . Financial resource strain: Not on file  . Food insecurity:    Worry: Not on file    Inability: Not on file  . Transportation  needs:    Medical: Not on file    Non-medical: Not on file  Tobacco Use  . Smoking status: Never Smoker  . Smokeless tobacco: Never Used  Substance and Sexual Activity  . Alcohol use: No    Alcohol/week: 0.0 standard drinks    Comment: 04/30/2016 "nothing in years"  . Drug use: No  . Sexual activity: Never  Lifestyle  . Physical activity:    Days per week: Not on file    Minutes per session: Not on file  . Stress: Not on file  Relationships  . Social connections:    Talks on phone: Not on file    Gets together: Not on file    Attends religious service: Not on file    Active member of club or organization: Not on file    Attends meetings of clubs or organizations: Not on file    Relationship status: Not on file  Other Topics Concern  . Not on file  Social History Narrative   Retired    Three children - One in [place], One in [place], and one in [place].        Family History:  The patient's family history includes Heart disease in his father and mother.  ROS:   Please see the history of  present illness.  All other systems are reviewed and otherwise negative.    PHYSICAL EXAM:   VS:  BP (!) 150/62   Pulse 65   Ht 5' 5"  (1.651 m)   Wt 137 lb (62.1 kg)   SpO2 99%   BMI 22.80 kg/m   BMI: Body mass index is 22.8 kg/m. GEN: Well nourished, well developed chronically ill appearing WM, in no acute distress HEENT: normocephalic, atraumatic Neck: no JVD, carotid bruits, or masses Cardiac: RRR; no murmurs, rubs, or gallops, bilateral 1+ pitting edema left more than right, PICC line in the right arm Respiratory: Crackles at bases bilaterally GI: soft, nontender, nondistended, + BS MS: no deformity or atrophy Skin: warm and dry, no rash Neuro:  Alert and Oriented x 3, Strength and sensation are intact, follows commands. reporst generalized R eye blurriness that comes and goes Psych: euthymic mood, full affect  Wt Readings from Last 3 Encounters:  04/20/18 137 lb (62.1 kg)  04/16/18 137 lb (62.1 kg)  04/09/18 125 lb (56.7 kg)    Studies/Labs Reviewed:   EKG:  EKG was ordered today and personally reviewed, it shows atrial paced rhythm with left bundle branch block.  Recent Labs: 11/07/2017: B Natriuretic Peptide 219.3 01/14/2018: NT-Pro BNP 3,168; TSH 3.390 03/29/2018: ALT 13 03/31/2018: Magnesium 2.1 04/06/2018: BUN 31; Creatinine 2.3; Hemoglobin 9.8; Platelets 222; Potassium 4.6; Sodium 140   Lipid Panel    Component Value Date/Time   CHOL 130 05/01/2017 0827   TRIG 70.0 05/01/2017 0827   HDL 56.00 05/01/2017 0827   CHOLHDL 2 05/01/2017 0827   VLDL 14.0 05/01/2017 0827   LDLCALC 60 05/01/2017 0827    Additional studies/ records that were reviewed today include: Summarized above  ASSESSMENT & PLAN:   1. Bioprosthetic aortic valve endocarditis -diagnosed on November 13, 2017, on antibiotics IV ampicillin and Rocephin for total of 6 weeks until December 22, 2017.  Recurrent fever, with no evidence of endocarditis on TEE on March 30, 2018, on ongoing Rocephin and  ampicillin for 6 weeks via PICC line.   No recurrent fevers, all blood cultures negative.  He is to follow with ID next week.  2. CAD -status post cath  on December 15, 2017 with stable findings as above, medical therapy is indicated.  3.  Paroxysmal atrial fibrillation, he is maintaining sinus rhythm, EKG today shows atrial pacing.  On Eliquis, some bleeding from PICC line, last hemoglobin 9.8, will recheck today.  4.  Acute on chronic combined CHF with cardiomyopathy - LV function has declined over the last several years.  I will add Lasix 40 mg daily, check BMP and BNP today, follow-up in 6.  5. Sick sinus syndrome, status post pacemaker placement, working normally, followed by Dr. Lovena Le.  Disposition: Follow-up with me in 3 weeks..  Medication Adjustments/Labs and Tests Ordered: Current medicines are reviewed at length with the patient today.  Concerns regarding medicines are outlined above. Medication changes, Labs and Tests ordered today are summarized above and listed in the Patient Instructions accessible in Encounters.   Signed, Ena Dawley, MD  04/20/2018 11:34 AM    [address], [place],   [address] Phone: [phone]; Fax: [phone]

## 2018-04-20 NOTE — Patient Instructions (Signed)
Medication Instructions:   START TAKING LASIX 40 MG ONCE DAILY  If you need a refill on your cardiac medications before your next appointment, please call your pharmacy.     Lab work:  TODAY--CMET AND CBC W DIFF  If you have labs (blood work) drawn today and your tests are completely normal, you will receive your results only by: Marland Kitchen MyChart Message (if you have MyChart) OR . A paper copy in the mail If you have any lab test that is abnormal or we need to change your treatment, we will call you to review the results.    Follow-Up:  WITH DR Meda Coffee ON 05/14/18 AT HER 11:40 AM SLOT--OK TO ADD PER DR NELSON AND Leslieanne Cobarrubias

## 2018-04-21 ENCOUNTER — Telehealth: Payer: Self-pay | Admitting: *Deleted

## 2018-04-21 DIAGNOSIS — D649 Anemia, unspecified: Secondary | ICD-10-CM

## 2018-04-21 DIAGNOSIS — R7989 Other specified abnormal findings of blood chemistry: Secondary | ICD-10-CM

## 2018-04-21 NOTE — Telephone Encounter (Signed)
Spoke with the pts wife and endorsed to her the pts labs and recommendations per Dr Meda Coffee, for him to have repeat labs (bmet and cbc w diff) at his next OV with her, on 05/14/18.  Scheduled the pt his lab appt for repeat BMET and CBC W DIFF, same day as he see's Dr Meda Coffee, on 05/14/18.  Wife verbalized understanding and agrees with plan.  Wife will endorse this to the pt.

## 2018-04-21 NOTE — Telephone Encounter (Signed)
-----   Message from Dorothy Spark, MD sent at 04/21/2018  8:58 AM EST ----- Worsening Crea and anemia, we will repeat it at the next vist in 3 weeks,.

## 2018-04-22 ENCOUNTER — Ambulatory Visit (INDEPENDENT_AMBULATORY_CARE_PROVIDER_SITE_OTHER): Payer: Medicare Other | Admitting: Internal Medicine

## 2018-04-22 ENCOUNTER — Encounter: Payer: Self-pay | Admitting: Internal Medicine

## 2018-04-22 VITALS — BP 144/74 | HR 76 | Temp 97.8°F | Ht 66.0 in | Wt 135.0 lb

## 2018-04-22 DIAGNOSIS — Z5181 Encounter for therapeutic drug level monitoring: Secondary | ICD-10-CM | POA: Diagnosis not present

## 2018-04-22 DIAGNOSIS — Z952 Presence of prosthetic heart valve: Secondary | ICD-10-CM | POA: Diagnosis not present

## 2018-04-22 DIAGNOSIS — B952 Enterococcus as the cause of diseases classified elsewhere: Secondary | ICD-10-CM | POA: Diagnosis not present

## 2018-04-22 DIAGNOSIS — R7881 Bacteremia: Secondary | ICD-10-CM | POA: Diagnosis not present

## 2018-04-22 MED ORDER — AMOXICILLIN 500 MG PO TABS: 500.0000 mg | ORAL_TABLET | Freq: Two times a day (BID) | ORAL | 11 refills | Status: DC

## 2018-04-22 NOTE — Progress Notes (Signed)
   Subjective:    Patient ID: Ryan [name], male    DOB: 1929-06-29, 84 y.o.   MRN: [id number]  HPI He is here for follow-up of his recent hospitalization. He came back to the hospital again with fever and found enterococcal bacteremia again.  Fortunately his TEE was negative for vegetation with his history of prosthetic valve placement and pacemaker.  He is though undergoing 6 weeks of dual beta-lactam therapy with ceftriaxone and ampicillin.  He is tolerating with this well and is here today with his wife.  No recent fever or chills.  No associated rash or diarrhea.  Plan to treat through December 10.   Review of Systems  Constitutional: Negative for chills, fatigue and fever.  Gastrointestinal: Negative for diarrhea and nausea.  Skin: Negative for rash.       Objective:   Physical Exam  Constitutional: He appears well-developed and well-nourished. No distress.  Eyes: No scleral icterus.  Cardiovascular: Normal rate, regular rhythm and normal heart sounds.  Pulmonary/Chest: Effort normal and breath sounds normal. No respiratory distress.  Skin: No rash noted.    SH: no tobacco history      Assessment & Plan:

## 2018-04-22 NOTE — Assessment & Plan Note (Signed)
He is doing well with treatment and no concerning signs at this time.  He will complete his antibiotics in December 10 and I will send a prescription for him to start the following day with amoxicillin 500 mg twice a day.  He will stay on this indefinitely.  I will repeat blood cultures when he comes back.

## 2018-04-22 NOTE — Assessment & Plan Note (Signed)
As noted, with his AVR and recent bacteremia, I will put him on suppressive antibiotics for Enterococcus indefinitely

## 2018-04-22 NOTE — Assessment & Plan Note (Signed)
Labs reviewed and no concerns, stable creat

## 2018-04-26 DIAGNOSIS — T826XXA Infection and inflammatory reaction due to cardiac valve prosthesis, initial encounter: Secondary | ICD-10-CM | POA: Diagnosis not present

## 2018-04-27 ENCOUNTER — Other Ambulatory Visit: Payer: Self-pay | Admitting: Adult Health

## 2018-04-27 DIAGNOSIS — D649 Anemia, unspecified: Secondary | ICD-10-CM

## 2018-04-28 ENCOUNTER — Encounter (HOSPITAL_COMMUNITY): Payer: Self-pay

## 2018-04-28 ENCOUNTER — Observation Stay (HOSPITAL_COMMUNITY)
Admission: EM | Admit: 2018-04-28 | Discharge: 2018-05-02 | Disposition: A | Discharge status: Home / Self Care | Payer: Medicare Other | Attending: Internal Medicine | Admitting: Internal Medicine

## 2018-04-28 ENCOUNTER — Other Ambulatory Visit: Payer: Self-pay | Admitting: Adult Health

## 2018-04-28 ENCOUNTER — Other Ambulatory Visit: Payer: Self-pay

## 2018-04-28 ENCOUNTER — Telehealth: Payer: Self-pay | Admitting: *Deleted

## 2018-04-28 ENCOUNTER — Emergency Department (HOSPITAL_COMMUNITY): Payer: Medicare Other

## 2018-04-28 ENCOUNTER — Other Ambulatory Visit (INDEPENDENT_AMBULATORY_CARE_PROVIDER_SITE_OTHER): Payer: Medicare Other

## 2018-04-28 DIAGNOSIS — E785 Hyperlipidemia, unspecified: Secondary | ICD-10-CM | POA: Diagnosis present

## 2018-04-28 DIAGNOSIS — E039 Hypothyroidism, unspecified: Secondary | ICD-10-CM | POA: Diagnosis not present

## 2018-04-28 DIAGNOSIS — R7881 Bacteremia: Secondary | ICD-10-CM

## 2018-04-28 DIAGNOSIS — Z882 Allergy status to sulfonamides status: Secondary | ICD-10-CM | POA: Insufficient documentation

## 2018-04-28 DIAGNOSIS — D123 Benign neoplasm of transverse colon: Secondary | ICD-10-CM | POA: Insufficient documentation

## 2018-04-28 DIAGNOSIS — I739 Peripheral vascular disease, unspecified: Secondary | ICD-10-CM | POA: Insufficient documentation

## 2018-04-28 DIAGNOSIS — Z7901 Long term (current) use of anticoagulants: Secondary | ICD-10-CM | POA: Insufficient documentation

## 2018-04-28 DIAGNOSIS — D122 Benign neoplasm of ascending colon: Secondary | ICD-10-CM | POA: Insufficient documentation

## 2018-04-28 DIAGNOSIS — Z8249 Family history of ischemic heart disease and other diseases of the circulatory system: Secondary | ICD-10-CM | POA: Insufficient documentation

## 2018-04-28 DIAGNOSIS — K644 Residual hemorrhoidal skin tags: Secondary | ICD-10-CM | POA: Diagnosis not present

## 2018-04-28 DIAGNOSIS — G4733 Obstructive sleep apnea (adult) (pediatric): Secondary | ICD-10-CM | POA: Insufficient documentation

## 2018-04-28 DIAGNOSIS — I480 Paroxysmal atrial fibrillation: Secondary | ICD-10-CM | POA: Diagnosis present

## 2018-04-28 DIAGNOSIS — I5042 Chronic combined systolic (congestive) and diastolic (congestive) heart failure: Secondary | ICD-10-CM | POA: Insufficient documentation

## 2018-04-28 DIAGNOSIS — T826XXA Infection and inflammatory reaction due to cardiac valve prosthesis, initial encounter: Secondary | ICD-10-CM | POA: Diagnosis present

## 2018-04-28 DIAGNOSIS — D124 Benign neoplasm of descending colon: Secondary | ICD-10-CM | POA: Insufficient documentation

## 2018-04-28 DIAGNOSIS — D649 Anemia, unspecified: Secondary | ICD-10-CM

## 2018-04-28 DIAGNOSIS — K641 Second degree hemorrhoids: Secondary | ICD-10-CM | POA: Diagnosis not present

## 2018-04-28 DIAGNOSIS — K922 Gastrointestinal hemorrhage, unspecified: Secondary | ICD-10-CM | POA: Diagnosis not present

## 2018-04-28 DIAGNOSIS — B952 Enterococcus as the cause of diseases classified elsewhere: Secondary | ICD-10-CM

## 2018-04-28 DIAGNOSIS — K219 Gastro-esophageal reflux disease without esophagitis: Secondary | ICD-10-CM | POA: Diagnosis not present

## 2018-04-28 DIAGNOSIS — Z950 Presence of cardiac pacemaker: Secondary | ICD-10-CM | POA: Diagnosis present

## 2018-04-28 DIAGNOSIS — Z8551 Personal history of malignant neoplasm of bladder: Secondary | ICD-10-CM | POA: Insufficient documentation

## 2018-04-28 DIAGNOSIS — D125 Benign neoplasm of sigmoid colon: Secondary | ICD-10-CM | POA: Insufficient documentation

## 2018-04-28 DIAGNOSIS — I5032 Chronic diastolic (congestive) heart failure: Secondary | ICD-10-CM | POA: Diagnosis present

## 2018-04-28 DIAGNOSIS — R079 Chest pain, unspecified: Secondary | ICD-10-CM | POA: Diagnosis not present

## 2018-04-28 DIAGNOSIS — D509 Iron deficiency anemia, unspecified: Principal | ICD-10-CM | POA: Insufficient documentation

## 2018-04-28 DIAGNOSIS — Z79890 Hormone replacement therapy (postmenopausal): Secondary | ICD-10-CM | POA: Insufficient documentation

## 2018-04-28 DIAGNOSIS — T826XXD Infection and inflammatory reaction due to cardiac valve prosthesis, subsequent encounter: Secondary | ICD-10-CM

## 2018-04-28 DIAGNOSIS — I2510 Atherosclerotic heart disease of native coronary artery without angina pectoris: Secondary | ICD-10-CM | POA: Diagnosis present

## 2018-04-28 DIAGNOSIS — K449 Diaphragmatic hernia without obstruction or gangrene: Secondary | ICD-10-CM | POA: Diagnosis not present

## 2018-04-28 DIAGNOSIS — K3189 Other diseases of stomach and duodenum: Secondary | ICD-10-CM | POA: Diagnosis not present

## 2018-04-28 DIAGNOSIS — Z888 Allergy status to other drugs, medicaments and biological substances status: Secondary | ICD-10-CM | POA: Insufficient documentation

## 2018-04-28 DIAGNOSIS — N184 Chronic kidney disease, stage 4 (severe): Secondary | ICD-10-CM | POA: Diagnosis present

## 2018-04-28 DIAGNOSIS — Z9842 Cataract extraction status, left eye: Secondary | ICD-10-CM | POA: Insufficient documentation

## 2018-04-28 DIAGNOSIS — R195 Other fecal abnormalities: Secondary | ICD-10-CM | POA: Insufficient documentation

## 2018-04-28 DIAGNOSIS — Z881 Allergy status to other antibiotic agents status: Secondary | ICD-10-CM | POA: Insufficient documentation

## 2018-04-28 DIAGNOSIS — M1990 Unspecified osteoarthritis, unspecified site: Secondary | ICD-10-CM | POA: Insufficient documentation

## 2018-04-28 DIAGNOSIS — E7800 Pure hypercholesterolemia, unspecified: Secondary | ICD-10-CM | POA: Diagnosis not present

## 2018-04-28 DIAGNOSIS — Q399 Congenital malformation of esophagus, unspecified: Secondary | ICD-10-CM | POA: Diagnosis not present

## 2018-04-28 DIAGNOSIS — Z955 Presence of coronary angioplasty implant and graft: Secondary | ICD-10-CM | POA: Insufficient documentation

## 2018-04-28 DIAGNOSIS — I38 Endocarditis, valve unspecified: Secondary | ICD-10-CM

## 2018-04-28 DIAGNOSIS — Z91018 Allergy to other foods: Secondary | ICD-10-CM | POA: Insufficient documentation

## 2018-04-28 DIAGNOSIS — N179 Acute kidney failure, unspecified: Secondary | ICD-10-CM | POA: Diagnosis not present

## 2018-04-28 DIAGNOSIS — Z885 Allergy status to narcotic agent status: Secondary | ICD-10-CM | POA: Insufficient documentation

## 2018-04-28 DIAGNOSIS — Z952 Presence of prosthetic heart valve: Secondary | ICD-10-CM

## 2018-04-28 DIAGNOSIS — I10 Essential (primary) hypertension: Secondary | ICD-10-CM | POA: Diagnosis present

## 2018-04-28 DIAGNOSIS — R0602 Shortness of breath: Secondary | ICD-10-CM | POA: Diagnosis not present

## 2018-04-28 DIAGNOSIS — I130 Hypertensive heart and chronic kidney disease with heart failure and stage 1 through stage 4 chronic kidney disease, or unspecified chronic kidney disease: Secondary | ICD-10-CM | POA: Insufficient documentation

## 2018-04-28 DIAGNOSIS — J45909 Unspecified asthma, uncomplicated: Secondary | ICD-10-CM | POA: Diagnosis present

## 2018-04-28 DIAGNOSIS — Z79899 Other long term (current) drug therapy: Secondary | ICD-10-CM | POA: Insufficient documentation

## 2018-04-28 DIAGNOSIS — Z9889 Other specified postprocedural states: Secondary | ICD-10-CM | POA: Insufficient documentation

## 2018-04-28 DIAGNOSIS — K6389 Other specified diseases of intestine: Secondary | ICD-10-CM | POA: Insufficient documentation

## 2018-04-28 DIAGNOSIS — Z8546 Personal history of malignant neoplasm of prostate: Secondary | ICD-10-CM | POA: Insufficient documentation

## 2018-04-28 DIAGNOSIS — Z9841 Cataract extraction status, right eye: Secondary | ICD-10-CM | POA: Insufficient documentation

## 2018-04-28 HISTORY — DX: Chronic kidney disease, stage 4 (severe): N18.4

## 2018-04-28 HISTORY — DX: Anemia, unspecified: D64.9

## 2018-04-28 LAB — COMPREHENSIVE METABOLIC PANEL
ALT: 11 U/L (ref 0–44)
AST: 21 U/L (ref 15–41)
Albumin: 3.2 g/dL — ABNORMAL LOW (ref 3.5–5.0)
Alkaline Phosphatase: 54 U/L (ref 38–126)
Anion gap: 9 (ref 5–15)
BUN: 48 mg/dL — ABNORMAL HIGH (ref 8–23)
CO2: 27 mmol/L (ref 22–32)
Calcium: 9.1 mg/dL (ref 8.9–10.3)
Chloride: 103 mmol/L (ref 98–111)
Creatinine, Ser: 3.21 mg/dL — ABNORMAL HIGH (ref 0.61–1.24)
GFR calc Af Amer: 19 mL/min — ABNORMAL LOW (ref >60)
GFR calc non Af Amer: 17 mL/min — ABNORMAL LOW (ref >60)
Glucose, Bld: 109 mg/dL — ABNORMAL HIGH (ref 70–99)
Potassium: 4.3 mmol/L (ref 3.5–5.1)
Sodium: 139 mmol/L (ref 135–145)
Total Bilirubin: 0.3 mg/dL (ref 0.3–1.2)
Total Protein: 6.2 g/dL — ABNORMAL LOW (ref 6.5–8.1)

## 2018-04-28 LAB — CBC WITH DIFFERENTIAL/PLATELET
BASOS PCT: 0.9 % (ref 0.0–3.0)
Basophils Absolute: 0.1 10*3/uL (ref 0.0–0.1)
Eosinophils Absolute: 0.2 10*3/uL (ref 0.0–0.7)
Eosinophils Relative: 3 % (ref 0.0–5.0)
HCT: 23.5 % — CL (ref 39.0–52.0)
Hemoglobin: 7.8 g/dL — CL (ref 13.0–17.0)
LYMPHS ABS: 1.1 10*3/uL (ref 0.7–4.0)
Lymphocytes Relative: 18.2 % (ref 12.0–46.0)
MCHC: 33.1 g/dL (ref 30.0–36.0)
MCV: 91.4 fl (ref 78.0–100.0)
MONOS PCT: 12.2 % — AB (ref 3.0–12.0)
Monocytes Absolute: 0.7 10*3/uL (ref 0.1–1.0)
NEUTROS ABS: 3.9 10*3/uL (ref 1.4–7.7)
NEUTROS PCT: 65.7 % (ref 43.0–77.0)
Platelets: 191 10*3/uL (ref 150.0–400.0)
RBC: 2.57 Mil/uL — ABNORMAL LOW (ref 4.22–5.81)
RDW: 15.3 % (ref 11.5–15.5)
WBC: 5.9 10*3/uL (ref 4.0–10.5)

## 2018-04-28 LAB — CBC
HCT: 25.5 % — ABNORMAL LOW (ref 39.0–52.0)
Hemoglobin: 7.9 g/dL — ABNORMAL LOW (ref 13.0–17.0)
MCH: 29.4 pg (ref 26.0–34.0)
MCHC: 31 g/dL (ref 30.0–36.0)
MCV: 94.8 fL (ref 80.0–100.0)
Platelets: 199 10*3/uL (ref 150–400)
RBC: 2.69 MIL/uL — ABNORMAL LOW (ref 4.22–5.81)
RDW: 14.6 % (ref 11.5–15.5)
WBC: 6 10*3/uL (ref 4.0–10.5)
nRBC: 0 % (ref 0.0–0.2)

## 2018-04-28 LAB — PREPARE RBC (CROSSMATCH)

## 2018-04-28 LAB — PROTIME-INR
INR: 1.27
PROTHROMBIN TIME: 15.7 s — AB (ref 11.4–15.2)

## 2018-04-28 LAB — I-STAT TROPONIN, ED: Troponin i, poc: 0.02 ng/mL (ref 0.00–0.08)

## 2018-04-28 LAB — POC OCCULT BLOOD, ED: Fecal Occult Bld: POSITIVE — AB

## 2018-04-28 MED ORDER — HYDRALAZINE HCL 20 MG/ML IJ SOLN: 5.0000 mg | INTRAMUSCULAR | Status: DC | PRN

## 2018-04-28 MED ORDER — METOPROLOL SUCCINATE ER 100 MG PO TB24
100.0000 mg | ORAL_TABLET | Freq: Every day | ORAL | Status: DC
  Administered 2018-04-29 – 2018-05-02 (×4): 100 mg via ORAL
  Filled 2018-04-28 (×4): qty 1

## 2018-04-28 MED ORDER — AMLODIPINE BESYLATE 2.5 MG PO TABS
5.0000 mg | ORAL_TABLET | Freq: Every day | ORAL | Status: DC
  Administered 2018-04-29 – 2018-05-02 (×3): 5 mg via ORAL
  Filled 2018-04-28 (×3): qty 2

## 2018-04-28 MED ORDER — SODIUM CHLORIDE 0.9 % IV SOLN
2.0000 g | Freq: Two times a day (BID) | INTRAVENOUS | Status: DC
  Administered 2018-04-29 – 2018-05-02 (×7): 2 g via INTRAVENOUS
  Filled 2018-04-28 (×8): qty 20

## 2018-04-28 MED ORDER — ONDANSETRON HCL 4 MG/2ML IJ SOLN: 4.0000 mg | Freq: Four times a day (QID) | INTRAMUSCULAR | Status: DC | PRN

## 2018-04-28 MED ORDER — NETARSUDIL DIMESYLATE 0.02 % OP SOLN: 1.0000 [drp] | Freq: Every day | OPHTHALMIC | Status: DC

## 2018-04-28 MED ORDER — SODIUM CHLORIDE 0.9 % IV SOLN
10.0000 mL/h | Freq: Once | INTRAVENOUS | Status: AC
  Administered 2018-04-29: 10 mL/h via INTRAVENOUS

## 2018-04-28 MED ORDER — ZOLPIDEM TARTRATE 5 MG PO TABS
5.0000 mg | ORAL_TABLET | Freq: Every evening | ORAL | Status: DC | PRN
  Filled 2018-04-28: qty 1

## 2018-04-28 MED ORDER — ACETAMINOPHEN 650 MG RE SUPP: 650.0000 mg | Freq: Four times a day (QID) | RECTAL | Status: DC | PRN

## 2018-04-28 MED ORDER — SODIUM CHLORIDE 0.9 % IV SOLN
2.0000 g | Freq: Three times a day (TID) | INTRAVENOUS | Status: DC
  Administered 2018-04-29 – 2018-05-02 (×9): 2 g via INTRAVENOUS
  Filled 2018-04-28 (×13): qty 2000

## 2018-04-28 MED ORDER — PANTOPRAZOLE SODIUM 40 MG PO TBEC
40.0000 mg | DELAYED_RELEASE_TABLET | Freq: Two times a day (BID) | ORAL | Status: DC
  Administered 2018-04-28 – 2018-05-02 (×8): 40 mg via ORAL
  Filled 2018-04-28 (×8): qty 1

## 2018-04-28 MED ORDER — ATORVASTATIN CALCIUM 40 MG PO TABS
40.0000 mg | ORAL_TABLET | Freq: Every day | ORAL | Status: DC
  Administered 2018-04-29 – 2018-05-01 (×3): 40 mg via ORAL
  Filled 2018-04-28 (×3): qty 1

## 2018-04-28 MED ORDER — SODIUM CHLORIDE 0.9 % IV SOLN
2.0000 g | Freq: Once | INTRAVENOUS | Status: AC
  Administered 2018-04-28: 2 g via INTRAVENOUS
  Filled 2018-04-28: qty 2000

## 2018-04-28 MED ORDER — NITROGLYCERIN 0.4 MG SL SUBL: 0.4000 mg | SUBLINGUAL_TABLET | SUBLINGUAL | Status: DC | PRN

## 2018-04-28 MED ORDER — VITAMIN D 25 MCG (1000 UNIT) PO TABS
1000.0000 [IU] | ORAL_TABLET | ORAL | Status: DC
  Administered 2018-04-29: 1000 [IU] via ORAL
  Filled 2018-04-28 (×2): qty 1

## 2018-04-28 MED ORDER — POLYETHYLENE GLYCOL 3350 17 G PO PACK: 17.0000 g | PACK | Freq: Every day | ORAL | Status: DC | PRN

## 2018-04-28 MED ORDER — VITAMIN B-12 1000 MCG PO TABS
1000.0000 ug | ORAL_TABLET | Freq: Every day | ORAL | Status: DC
  Administered 2018-04-29 – 2018-05-02 (×3): 1000 ug via ORAL
  Filled 2018-04-28 (×2): qty 1

## 2018-04-28 MED ORDER — LATANOPROST 0.005 % OP SOLN
1.0000 [drp] | Freq: Every day | OPHTHALMIC | Status: DC
  Administered 2018-04-28 – 2018-05-01 (×4): 1 [drp] via OPHTHALMIC
  Filled 2018-04-28: qty 2.5

## 2018-04-28 MED ORDER — AMPICILLIN IV (FOR PTA / DISCHARGE USE ONLY): 2.0000 g | Freq: Three times a day (TID) | INTRAVENOUS | Status: DC

## 2018-04-28 MED ORDER — ONDANSETRON HCL 4 MG PO TABS: 4.0000 mg | ORAL_TABLET | Freq: Four times a day (QID) | ORAL | Status: DC | PRN

## 2018-04-28 MED ORDER — CEFTRIAXONE IV (FOR PTA / DISCHARGE USE ONLY): 2.0000 g | Freq: Two times a day (BID) | INTRAVENOUS | Status: DC

## 2018-04-28 MED ORDER — ACETAMINOPHEN 325 MG PO TABS: 650.0000 mg | ORAL_TABLET | Freq: Four times a day (QID) | ORAL | Status: DC | PRN

## 2018-04-28 MED ORDER — ISOSORBIDE MONONITRATE ER 60 MG PO TB24
60.0000 mg | ORAL_TABLET | Freq: Every day | ORAL | Status: DC
  Administered 2018-04-29: 60 mg via ORAL
  Filled 2018-04-28: qty 1

## 2018-04-28 MED ORDER — LEVOTHYROXINE SODIUM 25 MCG PO TABS
12.5000 ug | ORAL_TABLET | Freq: Every day | ORAL | Status: DC
  Administered 2018-04-29 – 2018-05-02 (×3): 12.5 ug via ORAL
  Filled 2018-04-28 (×2): qty 1
  Filled 2018-04-28: qty 0.5
  Filled 2018-04-28 (×2): qty 1

## 2018-04-28 MED ORDER — ACETAMINOPHEN-CODEINE #3 300-30 MG PO TABS: 0.5000 | ORAL_TABLET | ORAL | Status: DC | PRN

## 2018-04-28 NOTE — ED Triage Notes (Addendum)
Pt c/o SOB and intermittent chest pain. Pt hgb has dropped to 7.8. Pt is eliquis and aspirin. Pt is being treated for endocarditis with IV antibiotics. Pt has been having dark stools for 2 weeks.

## 2018-04-28 NOTE — H&P (Signed)
History and Physical    Ryan [name] WPY:[id number] DOB: 1929-12-02 DOA: 04/28/2018  Referring MD/NP/PA:   PCP: Dorothyann Peng, NP   Patient coming from:  The patient is coming from home.  At baseline, pt is independent for most of ADL.        Chief Complaint: Generalized weakness, dark stool, shortness of breath  HPI: Ryan [name] is a 84 y.o. male with medical history significant of hypertension, hyperlipidemia, asthma, GERD, hypothyroidism, diverticulosis, dCHF, CAD, PVD, atrial fibrillation on Eliquis, OSA not on CPAP, pacemaker placement due to tachy-bradycardia syndrome, CKD-4, bacteria aortic valve endocarditis on IV ampicillin and Rocephin currently (s/p of AVR), who presents with generalized weakness, dark stool, shortness of breath.  Patient states that he has been having intermittent dark stool for almost 2 weeks.  He also developed generalized weakness and mild shortness of breath.  Patient does not have chest pain, but reports burning sensation in upper chest and throat.  No cough, fever or chills.  Patient has intermittent mild nausea, but no vomiting, diarrhea or abdominal pain.  Denies symptoms of UTI or unilateral weakness. Of note, patient was recently hospitalized from 10/28-11/1 due to enterococcus bacteremia secondary to bioprosthetic aortic valve endocarditis. Pt is currently being treated with IV ampicillin and Rocephin via PICC line (supposed to take antibiotics for 6 weeks, has completed 4 weeks now). Per his son-in-law, Dr. Loletha Carrow who is a GI doctor in [place], pt had colonoscopy ~15 years ago which showed severe diverticulosis.  Patient never had EGD in the past. Pt is taking Eliquis for A fib, last dose was at 9:00 this AM.  ED Course: pt was found to have positive FOBT, hemoglobin 7.9 (10.4 on 04/01/2018--> 8.3 on 04/20/2018), WBC 6.0, worsening renal function, temperature normal, no tachycardia, oxygen saturation 97% on room air, negative chest x-ray.  Patient  is placed on telemetry bed for observation.   Review of Systems:   General: no fevers, chills, no body weight gain, has fatigue HEENT: no blurry vision, hearing changes or sore throat Respiratory: has dyspnea, no coughing, wheezing CV: no chest pain, no palpitations GI: has nausea, no vomiting, abdominal pain, diarrhea, constipation. Has dark stool. GU: no dysuria, burning on urination, increased urinary frequency, hematuria  Ext: has mild leg edema Neuro: no unilateral weakness, numbness, or tingling, no vision change or hearing loss Skin: no rash, no skin tear. MSK: No muscle spasm, no deformity, no limitation of range of movement in spin Heme: No easy bruising.  Travel history: No recent long distant travel.  Allergy:  Allergies  Allergen Reactions  . Ciprofloxacin Rash  . Hydrochlorothiazide Rash  . Sulfa Antibiotics Anaphylaxis  . Ace Inhibitors Cough  . Losartan Cough  . Carvedilol Cough    Pt states it "causes him too cough."  . Lisinopril Cough    Pt reports worsening cough and "feeling wheezy."  . Soy Allergy Nausea And Vomiting    Past Medical History:  Diagnosis Date  . Age-related macular degeneration   . Allergic rhinitis   . 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 Arthritis    "hands, lower back" (04/30/2016)  . Asthma   . Bacterial endocarditis   . Carotid artery obstruction    a. s/p bilat CEA.  . Chronic combined systolic (congestive) and diastolic (congestive) heart failure ([place]) 03/18/2016  . Chronic lower back pain   . Claudication in peripheral vascular disease ([place]) 01/01/2018   Claudication  . Coronary arteriosclerosis in native artery    a.  2017 Cardiac catheterization demonstrated worsening CAD with 70% mid LCx, 80% OM1 and 80% mid RCA stenosis. b.  In 11/17, he underwent successful rotational atherectomy and DES to RCA.  Marland Kitchen Decreased diffusion capacity 10/23/2017  . Diverticulitis of colon   . DJD (degenerative joint disease)   . Elevated  TSH 06/30/2017  . GERD (gastroesophageal reflux disease)   . History of hiatal hernia   . HLD (hyperlipidemia)   . Hypertension   . Hypertensive heart disease with heart failure ([place])   . Intractable back pain 11/07/2017  . OSA (obstructive sleep apnea)    intolerant to CPAP  . PAF (paroxysmal atrial fibrillation) ([place])   . Paroxysmal supraventricular tachycardia ([place])   . Presence of permanent cardiac pacemaker   . Primary malignant neoplasm of bladder ([place])   . Prostate cancer ([place])   . 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 S/P AVR 12/02/2017  . Syncope 10/23/2017  . Tachy-brady syndrome ([place])   . Thrombocytopenia ([place]) 11/07/2017  . Urinary retention 11/07/2017    Past Surgical History:  Procedure Laterality Date  . APPENDECTOMY    . CARDIAC CATHETERIZATION N/A 01/17/2016   Procedure: Right/Left Heart Cath and Coronary Angiography;  Surgeon: Belva Crome, MD;  Location: [place] [facility];  Service: Cardiovascular;  Laterality: N/A;  . CARDIAC CATHETERIZATION N/A 04/30/2016   Procedure: Coronary/Graft Atherectomy;  Surgeon: Sherren Mocha, MD;  Location: [place] [facility];  Service: Cardiovascular;  Laterality: N/A;  . CAROTID ENDARTERECTOMY Bilateral   . CATARACT EXTRACTION W/ INTRAOCULAR LENS  IMPLANT, BILATERAL Bilateral   . COLON SURGERY  1981   Meckles Diverticulum with volvulus  . CORONARY ANGIOGRAPHY N/A 01/18/2018   Procedure: CORONARY ANGIOGRAPHY;  Surgeon: Lorretta Harp, MD;  Location: [place] [facility];  Service: Cardiovascular;  Laterality: N/A;  . EP IMPLANTABLE DEVICE N/A 03/21/2016   Procedure: Pacemaker Implant;  Surgeon: Evans Lance, MD;  Location: [place] [facility];  Service: Cardiovascular;  Laterality: N/A;  . EYE SURGERY    . INGUINAL HERNIA REPAIR Right 2009  . INSERT / REPLACE / REMOVE PACEMAKER    . INSERTION PROSTATE RADIATION SEED    . TEE WITHOUT CARDIOVERSION N/A 06/10/2016   Procedure: TRANSESOPHAGEAL  ECHOCARDIOGRAM (TEE);  Surgeon: Sherren Mocha, MD;  Location: [facility];  Service: Open Heart Surgery;  Laterality: N/A;  . TEE WITHOUT CARDIOVERSION N/A 11/10/2017   Procedure: TRANSESOPHAGEAL ECHOCARDIOGRAM (TEE);  Surgeon: Acie Fredrickson Wonda Cheng, MD;  Location: [facility] ENDOSCOPY;  Service: Cardiovascular;  Laterality: N/A;  . TEE WITHOUT CARDIOVERSION N/A 12/15/2017   Procedure: TRANSESOPHAGEAL ECHOCARDIOGRAM (TEE);  Surgeon: Buford Dresser, MD;  Location[facility] END[facility]COPY;  Service: Cardiovascular;  Laterality: N/A;  . TEE WITHOUT CARDIOVERSION N/A 03/30/2018   Procedure: TRANSESOPHAGEAL ECHOCARDIOGRAM (TEE);  Surgeon: Jerline Pain, MD;  Location: [facility] ENDOSCOPY;  Service: Cardiovascular;  Laterality: N/A;  . TONSILLECTOMY  ~ 1947  . TRANSCATHETER AORTIC VALVE REPLACEMENT, TRANSFEMORAL N/A 06/10/2016   Procedure: TRANSCATHETER AORTIC VALVE REPLACEMENT, TRANSFEMORAL;  Surgeon: Sherren Mocha, MD;  Location: [place];  Service: Open Heart Surgery;  Laterality: N/A;    Social History:  reports that he has never smoked. He has never used smokeless tobacco. He reports that he does not drink alcohol or use drugs.  Family History:  Family History  Problem Relation Age of Onset  . Heart disease Mother   . Heart disease Father      Prior to Admission medications   Medication Sig Start Date  End Date Taking? Authorizing Provider  acetaminophen-codeine (TYLENOL #3) 300-30 MG tablet Take 0.5-1 tablets by mouth every 4 (four) hours as needed for moderate pain. 04/16/18  Yes Nafziger, Tommi Rumps, NP  amLODipine (NORVASC) 5 MG tablet Take 1 tablet (5 mg total) by mouth daily. 01/20/18  Yes Kroeger, Daleen Snook M., PA-C  ampicillin IVPB Inject 2 g into the vein every 8 (eight) hours. As a continuous infusion. Indication: PVE/PPM infection Last Day of Therapy:  May 10, 2018 Labs - Once weekly:  CBC/D and BMP, Labs - Every other week:  ESR and CRP 04/02/18 05/11/18 Yes Thurnell Lose, MD  apixaban (ELIQUIS) 2.5 MG TABS  tablet Take 1 tablet (2.5 mg total) by mouth 2 (two) times daily. 10/22/17  Yes Evans Lance, MD  atorvastatin (LIPITOR) 40 MG tablet Take 1 tablet (40 mg total) by mouth daily at 6 PM. 01/19/18  Yes Kroeger, Lorelee Cover., PA-C  cefTRIAXone (ROCEPHIN) IVPB Inject 2 g into the vein every 12 (twelve) hours. Indication:  PVE/PPM infection Last Day of Therapy:  May 10, 2018 Labs - Once weekly:  CBC/D and BMP, Labs - Every other week:  ESR and CRP 04/02/18  Yes Thurnell Lose, MD  cholecalciferol (VITAMIN D) 1000 units tablet Take 1,000 Units by mouth 2 (two) times a week.    Yes [provider]  furosemide (LASIX) 40 MG tablet Take 1 tablet (40 mg total) by mouth daily. Patient taking differently: Take 40 mg by mouth daily as needed for fluid.  04/20/18  Yes Dorothy Spark, MD  isosorbide mononitrate (IMDUR) 60 MG 24 hr tablet Take 1 tablet (60 mg total) by mouth daily. 01/20/18  Yes Kroeger, Daleen Snook M., PA-C  latanoprost (XALATAN) 0.005 % ophthalmic solution Place 1 drop into both eyes at bedtime.   Yes [provider]  levothyroxine (SYNTHROID, LEVOTHROID) 25 MCG tablet Take 0.5 tablets (12.5 mcg total) by mouth daily before breakfast. 10/21/17  Yes Dorothy Spark, MD  metoprolol succinate (TOPROL-XL) 100 MG 24 hr tablet Take 1 tablet (100 mg total) by mouth daily. Take with or immediately following a meal. 01/20/18  Yes Kroeger, Daleen Snook M., PA-C  Netarsudil Dimesylate (RHOPRESSA) 0.02 % SOLN Place 1 drop into both eyes at bedtime.   Yes [provider]  nitroGLYCERIN (NITROSTAT) 0.4 MG SL tablet Place 1 tablet (0.4 mg total) under the tongue every 5 (five) minutes x 3 doses as needed for chest pain. 01/19/18  Yes Kroeger, Daleen Snook M., PA-C  omeprazole (PRILOSEC) 20 MG capsule Take 20 mg by mouth daily.   Yes [provider]  polyethylene glycol (MIRALAX / GLYCOLAX) packet Take 17 g by mouth daily. Patient taking differently: Take 17 g by mouth daily as needed  for moderate constipation.  11/14/17  Yes Bonnell Public, MD  vitamin B-12 (CYANOCOBALAMIN) 1000 MCG tablet Take 1,000 mcg by mouth daily.   Yes [provider]  albuterol (PROVENTIL) (2.5 MG/3ML) 0.083% nebulizer solution Take 3 mLs (2.5 mg total) by nebulization every 8 (eight) hours as needed for wheezing or shortness of breath. Patient not taking: Reported on 04/28/2018 10/13/17   Dorothyann Peng, NP  amoxicillin (AMOXIL) 500 MG tablet Take 1 tablet (500 mg total) by mouth 2 (two) times daily. 05/12/18   Thayer Headings, MD  budesonide-formoterol (SYMBICORT) 80-4.5 MCG/ACT inhaler Inhale 2 puffs into the lungs 2 (two) times daily. Patient not taking: Reported on 04/28/2018 10/20/17   Lauraine Rinne, NP  lansoprazole (PREVACID) 15 MG capsule  Take 1 capsule (15 mg total) by mouth daily at 12 noon. Patient not taking: Reported on 04/28/2018 01/28/18   Dorothy Spark, MD  menthol-cetylpyridinium (CEPACOL) 3 MG lozenge Take 1 lozenge (3 mg total) by mouth as needed for sore throat. Patient not taking: Reported on 04/28/2018 10/24/17   Lavina Hamman, MD  senna (SENOKOT) 8.6 MG TABS tablet Take 1 tablet (8.6 mg total) by mouth daily as needed for mild constipation. Patient not taking: Reported on 04/28/2018 11/14/17   Bonnell Public, MD    Physical Exam: Vitals:   04/29/18 0307 04/29/18 0329 04/29/18 0538 04/29/18 0548  BP: (!) 131/58 (!) 135/59  (!) 127/58  Pulse: 64 65  66  Resp:  17  17  Temp: 98.1 F (36.7 C) 98.4 F (36.9 C)  98.2 F (36.8 C)  TempSrc: Oral Oral  Oral  SpO2: 94%   95%  Weight:   61.3 kg   Height:       General: Not in acute distress HEENT:       Eyes: PERRL, EOMI, no scleral icterus.       ENT: No discharge from the ears and nose, no pharynx injection, no tonsillar enlargement.        Neck: No JVD, no bruit, no mass felt. Heme: No neck lymph node enlargement. Cardiac: S1/S2, RRR, 1/6 murmurs, No gallops or rubs. Respiratory: No rales, wheezing,  rhonchi or rubs. GI: Soft, nondistended, nontender, no rebound pain, no organomegaly, BS present. GU: No hematuria Ext: has trace edema bilaterally. 2+DP/PT pulse bilaterally. Musculoskeletal: No joint deformities, No joint redness or warmth, no limitation of ROM in spin. Skin: No rashes.  Neuro: Alert, oriented X3, cranial nerves II-XII grossly intact, moves all extremities normally.  Psych: Patient is not psychotic, no suicidal or hemocidal ideation.  Labs on Admission: I have personally reviewed following labs and imaging studies  CBC: Recent Labs  Lab 04/28/18 0939 04/28/18 1752  WBC 5.9 6.0  NEUTROABS 3.9  --   HGB 7.8 Repeated and verified X2.* 7.9*  HCT 23.5 Repeated and verified X2.* 25.5*  MCV 91.4 94.8  PLT 191.0 203   Basic Metabolic Panel: Recent Labs  Lab 04/28/18 1752  NA 139  K 4.3  CL 103  CO2 27  GLUCOSE 109*  BUN 48*  CREATININE 3.21*  CALCIUM 9.1   GFR: Estimated Creatinine Clearance: 14.6 mL/min (A) (by C-G formula based on SCr of 3.21 mg/dL (H)). Liver Function Tests: Recent Labs  Lab 04/28/18 1752  AST 21  ALT 11  ALKPHOS 54  BILITOT 0.3  PROT 6.2*  ALBUMIN 3.2*   No results for input(s): LIPASE, AMYLASE in the last 168 hours. No results for input(s): AMMONIA in the last 168 hours. Coagulation Profile: Recent Labs  Lab 04/28/18 2013  INR 1.27   Cardiac Enzymes: No results for input(s): CKTOTAL, CKMB, CKMBINDEX, TROPONINI in the last 168 hours. BNP (last 3 results) Recent Labs    10/20/17 1058 01/14/18 1309  PROBNP 146.0* 3,168*   HbA1C: No results for input(s): HGBA1C in the last 72 hours. CBG: No results for input(s): GLUCAP in the last 168 hours. Lipid Profile: No results for input(s): CHOL, HDL, LDLCALC, TRIG, CHOLHDL, LDLDIRECT in the last 72 hours. Thyroid Function Tests: No results for input(s): TSH, T4TOTAL, FREET4, T3FREE, THYROIDAB in the last 72 hours. Anemia Panel: Recent Labs    04/28/18 0939  FERRITIN 17*   TIBC 301  IRON 25*   Urine analysis:  Component Value Date/Time   COLORURINE YELLOW 03/28/2018 1821   APPEARANCEUR CLEAR 03/28/2018 1821   LABSPEC 1.013 03/28/2018 1821   PHURINE 5.0 03/28/2018 1821   GLUCOSEU NEGATIVE 03/28/2018 1821   HGBUR NEGATIVE 03/28/2018 1821   BILIRUBINUR NEGATIVE 03/28/2018 1821   KETONESUR NEGATIVE 03/28/2018 1821   PROTEINUR NEGATIVE 03/28/2018 1821   NITRITE NEGATIVE 03/28/2018 1821   LEUKOCYTESUR NEGATIVE 03/28/2018 1821   Sepsis Labs: _0 (procalcitonin:4,lacticidven:4) )No results found for this or any previous visit (from the past 240 hour(s)).   Radiological Exams on Admission: Dg Chest 2 View  Result Date: 04/28/2018 CLINICAL DATA:  Dyspnea, chest pain, anemia EXAM: CHEST - 2 VIEW COMPARISON:  03/28/2018 chest radiograph. FINDINGS: Stable configuration of 2 lead left subclavian pacemaker and aortic valve prosthesis. Right PICC terminates in middle third of the SVC. Stable cardiomediastinal silhouette with top-normal heart size. No pneumothorax. No pleural effusion. Lungs appear clear, with no acute consolidative airspace disease and no pulmonary edema. IMPRESSION: No active cardiopulmonary disease. Electronically Signed   By: Ilona Sorrel M.D.   On: 04/28/2018 18:55     EKG: Independently reviewed.    Assessment/Plan Principal Problem:   GIB (gastrointestinal bleeding) Active Problems:   PAF (paroxysmal atrial fibrillation) (HCC)   Pure hypercholesterolemia   CAD (coronary artery disease), native coronary artery   Hypertension   Prosthetic valve endocarditis (HCC)   S/P AVR   Presence of permanent cardiac pacemaker   Bacteremia due to Enterococcus   HLD (hyperlipidemia)   Asthma   Hypothyroidism   Chronic diastolic CHF (congestive heart failure) (HCC)   Symptomatic anemia   Acute renal failure superimposed on stage 4 chronic kidney disease (HCC)   Symptomatic anemia due to GIB : Hemoglobin dropped from 10.4 on  04/01/2018--> 8.3, then 7.9 today.  Patient has intermittent dark stool for 2 weeks, seems to have slow GI bleeding.  Per his son-in-law, patient had colonoscopy 15 years ago, which showed severe diverticulosis.  Patient never had EGD before.  Given his dark stool, there is possibility of upper GI bleeding.  Per his son-in law, Dr. Loletha Carrow, [facility] will see pt in AM  - will place in tele bed for obs - hold Eliquis - transfuse 2 units of blood now - NPO for possible EGD - oral pantoprazole 40 mg bid per Dr. Loletha Carrow recommendation - Zofran IV for nausea - Avoid NSAIDs and SQ heparin - Maintain IV access (2 large bore IVs if possible). - Monitor closely and follow q6h cbc, transfuse as necessary, if Hgb<7.0 - repeat CBC in AM  Atrial Fibrillation: CHA2DS2-VASc Score is 5, needs oral anticoagulation. Patient is Eliquis at home. Heart rate is well controlled. -hold Eliquis -continue metoprolol   Pure hypercholesterolemia: -lipitor  Hx of CAD: no CP -lipitor, Imdur and prn NTG  HTN:  -Continue home medications: Metoprolol, amlodipine, -IV hydralazine prn  Bacteremia due to Enterococcus due to prosthetic valve endocarditis:  S/P AVR -continue IV ampicillin and Rocephin for another 2 weeks  Asthma: stable -prn albuterol nebs  Hypothyroidism: Last TSH was 3.390 on 01/14/18 -Continue home Synthroid  Chronic diastolic CHF (congestive heart failure) ([place]): 2D echo on 03/30/2018 showed EF 55- 55% with aortic valve regurgitation.  Patient has trace leg edema, no JVD.  CHF to be compensated. -Hold Lasix due to worsening renal function -check BNP  AoCKD-III: Baseline Cre is 2.2-2.3, pt's Cre is 3.21 and BUN 48 on admission. Likely due to GIB and dehydration, and continuation of diuretics. -Blood transfusion as above -  Follow up renal function by BMP - Hold Lasix   DVT ppx: SCD Code Status: Full code Family Communication:  Yes, patient's son-in law  at bed side Disposition Plan:   Anticipate discharge back to previous home environment Consults called:  [facility] Admission status: Obs / tele     Date of Service 04/29/2018    Ivor Costa [facility] Pager [phone]  If 7PM-7AM, please contact night-coverage [email or web address] Password [facility] 04/29/2018, 6:59 AM

## 2018-04-28 NOTE — Telephone Encounter (Signed)
CRITICAL VALUE STICKER  CRITICAL VALUE:  7.8 hemoglobin and 23.2 hematocrit   RECEIVER (on-site recipient of call): Apolonio Schneiders  DATE & TIME NOTIFIED: 04/28/18 2:34 pm  MESSENGER (representative from lab): Christain [place]  MD NOTIFIED: Sallee Provencal  TIME OF NOTIFICATION:. 2:34 PM  RESPONSE:

## 2018-04-28 NOTE — ED Notes (Signed)
ED Provider at bedside. 

## 2018-04-28 NOTE — ED Notes (Signed)
PA aware that MD son at bedside is requesting last daily dose of antibiotics prior to starting blood.

## 2018-04-28 NOTE — ED Provider Notes (Signed)
[facility] EMERGENCY DEPARTMENT Provider Note   CSN: [id number] Arrival date & time: 04/28/18  1716     History   Chief Complaint Chief Complaint  Patient presents with  . Shortness of Breath  . Chest Pain    HPI Ryan [name] is a 84 y.o. male.  Patient who is currently undergoing treatment for endocarditis, completed 4 weeks of IV antibiotics at home, history of coronary artery disease status post stent on Eliquis --presents the emergency department with drop in hemoglobin.  About 1 month ago patient's baseline was about 10.  He has dropped into the sevens gradually over the past several weeks.  Patient has reported dark brown stools.  He has also had worsening decreased energy and shortness of breath with exertion over this time.  He has had some pain in the middle of his chest that radiates up to his neck.  This is described as burning in nature.  Symptoms are nonexertional and do not radiate.  No fevers or cough.  No abdominal pain, nausea, vomiting, or diarrhea.  No other easy bruising or bleeding reported.  Patient is never had any GI bleeding.  Patient's son-in-law, at bedside, is a gastroenterologist and performed Hemoccult at home.  Stool was heme positive.  The onset of this condition was acute. The course is constant. Aggravating factors: none. Alleviating factors: none.       Past Medical History:  Diagnosis Date  . Age-related macular degeneration   . Allergic rhinitis   . 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 Arthritis    "hands, lower back" (04/30/2016)  . Asthma   . Bacterial endocarditis   . Carotid artery obstruction    a. s/p bilat CEA.  . Chronic combined systolic (congestive) and diastolic (congestive) heart failure ([place]) 03/18/2016  . Chronic lower back pain   . Claudication in peripheral vascular disease ([place]) 01/01/2018   Claudication  . Coronary arteriosclerosis in native artery    a. 2017 Cardiac catheterization  demonstrated worsening CAD with 70% mid LCx, 80% OM1 and 80% mid RCA stenosis. b.  In 11/17, he underwent successful rotational atherectomy and DES to RCA.  Marland Kitchen Decreased diffusion capacity 10/23/2017  . Diverticulitis of colon   . DJD (degenerative joint disease)   . Elevated TSH 06/30/2017  . GERD (gastroesophageal reflux disease)   . History of hiatal hernia   . HLD (hyperlipidemia)   . Hypertension   . Hypertensive heart disease with heart failure ([place])   . Intractable back pain 11/07/2017  . OSA (obstructive sleep apnea)    intolerant to CPAP  . PAF (paroxysmal atrial fibrillation) ([place])   . Paroxysmal supraventricular tachycardia ([place])   . Presence of permanent cardiac pacemaker   . Primary malignant neoplasm of bladder ([place])   . Prostate cancer ([place])   . 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 S/P AVR 12/02/2017  . Syncope 10/23/2017  . Tachy-brady syndrome ([place])   . Thrombocytopenia ([place]) 11/07/2017  . Urinary retention 11/07/2017    Patient Active Problem List   Diagnosis Date Noted  . Medication monitoring encounter 04/22/2018  . Presence of permanent cardiac pacemaker 03/29/2018  . Bacteremia due to Enterococcus 03/29/2018  . Hypertensive urgency   . Unstable angina ([place]) 01/15/2018  . Claudication in peripheral vascular disease ([place]) 01/01/2018  . S/P AVR 12/02/2017  . 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 Intractable back pain 11/07/2017  . Elevated troponin 11/07/2017  . Urinary retention 11/07/2017  .  Hypertension   . Syncope 10/23/2017  . Decreased diffusion capacity 10/23/2017  . 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 Elevated TSH 06/30/2017  . Anticoagulation management encounter 08/19/2016  . CAD (coronary artery disease), native coronary artery 04/30/2016  . Tachy-brady syndrome ([place])   . Hypertensive heart disease with heart failure ([place])   . Pure hypercholesterolemia   . PAF (paroxysmal atrial  fibrillation) ([place]) 03/18/2016  . Chronic combined systolic (congestive) and diastolic (congestive) heart failure ([place]) 03/18/2016  . Atrial fibrillation with rapid ventricular response ([place]) 03/18/2016  . DOE (dyspnea on exertion) 01/17/2016  . Coronary artery disease of native artery of native heart with stable angina pectoris [facility])     Past Surgical History:  Procedure Laterality Date  . APPENDECTOMY    . CARDIAC CATHETERIZATION N/A 01/17/2016   Procedure: Right/Left Heart Cath and Coronary Angiography;  Surgeon: Belva Crome, MD;  Location: [place] CV LAB;  Service: Cardiovascular;  Laterality: N/A;  . CARDIAC CATHETERIZATION N/A 04/30/2016   Procedure: Coronary/Graft Atherectomy;  Surgeon: Sherren Mocha, MD;  Location: [place] CV LAB;  Service: Cardiovascular;  Laterality: N/A;  . CAROTID ENDARTERECTOMY Bilateral   . CATARACT EXTRACTION W/ INTRAOCULAR LENS  IMPLANT, BILATERAL Bilateral   . COLON SURGERY  1981   Meckles Diverticulum with volvulus  . CORONARY ANGIOGRAPHY N/A 01/18/2018   Procedure: CORONARY ANGIOGRAPHY;  Surgeon: Lorretta Harp, MD;  Location: [place] CV LAB;  Service: Cardiovascular;  Laterality: N/A;  . EP IMPLANTABLE DEVICE N/A 03/21/2016   Procedure: Pacemaker Implant;  Surgeon: Evans Lance, MD;  Location: [place] CV LAB;  Service: Cardiovascular;  Laterality: N/A;  . EYE SURGERY    . INGUINAL HERNIA REPAIR Right 2009  . INSERT / REPLACE / REMOVE PACEMAKER    . INSERTION PROSTATE RADIATION SEED    . TEE WITHOUT CARDIOVERSION N/A 06/10/2016   Procedure: TRANSESOPHAGEAL ECHOCARDIOGRAM (TEE);  Surgeon: Sherren Mocha, MD;  Location: [place] [facility];  Service: Open Heart Surgery;  Laterality: N/A;  . TEE WITHOUT CARDIOVERSION N/A 11/10/2017   Procedure: TRANSESOPHAGEAL ECHOCARDIOGRAM (TEE);  Surgeon: Acie Fredrickson Wonda Cheng, MD;  Location: [facility] ENDOSCOPY;  Service: Cardiovascular;  Laterality: N/A;  . TEE WITHOUT CARDIOVERSION N/A 12/15/2017   Procedure:  TRANSESOPHAGEAL ECHOCARDIOGRAM (TEE);  Surgeon: Buford Dresser, MD;  Location: [facility] ENDOSCOPY;  Service: Cardiovascular;  Laterality: N/A;  . TEE WITHOUT CARDIOVERSION N/A 03/30/2018   Procedure: TRANSESOPHAGEAL ECHOCARDIOGRAM (TEE);  Surgeon: Jerline Pain, MD;  Location: [facility] ENDOSCOPY;  Service: Cardiovascular;  Laterality: N/A;  . TONSILLECTOMY  ~ 1947  . TRANSCATHETER AORTIC VALVE REPLACEMENT, TRANSFEMORAL N/A 06/10/2016   Procedure: TRANSCATHETER AORTIC VALVE REPLACEMENT, TRANSFEMORAL;  Surgeon: Sherren Mocha, MD;  Location: [place];  Service: Open Heart Surgery;  Laterality: N/A;        Home Medications    Prior to Admission medications   Medication Sig Start Date End Date Taking? Authorizing Provider  acetaminophen-codeine (TYLENOL #3) 300-30 MG tablet Take 0.5-1 tablets by mouth every 4 (four) hours as needed for moderate pain. 04/16/18   Nafziger, Tommi Rumps, NP  albuterol (PROVENTIL) (2.5 MG/3ML) 0.083% nebulizer solution Take 3 mLs (2.5 mg total) by nebulization every 8 (eight) hours as needed for wheezing or shortness of breath. 10/13/17   Nafziger, Tommi Rumps, NP  amLODipine (NORVASC) 5 MG tablet Take 1 tablet (5 mg total) by mouth daily. 01/20/18   Kroeger, Lorelee Cover., PA-C  amoxicillin (AMOXIL) 500 MG tablet Take 1 tablet (500 mg total) by mouth 2 (two) times daily. 05/12/18  Comer, Okey Regal, MD  ampicillin IVPB Inject 2 g into the vein every 8 (eight) hours. As a continuous infusion. Indication: PVE/PPM infection Last Day of Therapy:  May 10, 2018 Labs - Once weekly:  CBC/D and BMP, Labs - Every other week:  ESR and CRP 04/02/18 05/11/18  Thurnell Lose, MD  apixaban (ELIQUIS) 2.5 MG TABS tablet Take 1 tablet (2.5 mg total) by mouth 2 (two) times daily. 10/22/17   Evans Lance, MD  atorvastatin (LIPITOR) 40 MG tablet Take 1 tablet (40 mg total) by mouth daily at 6 PM. 01/19/18   Kroeger, Lorelee Cover., PA-C  budesonide-formoterol (SYMBICORT) 80-4.5 MCG/ACT inhaler Inhale 2 puffs into  the lungs 2 (two) times daily. Patient taking differently: Inhale 2 puffs into the lungs daily.  10/20/17   Lauraine Rinne, NP  cefTRIAXone (ROCEPHIN) IVPB Inject 2 g into the vein every 12 (twelve) hours. Indication:  PVE/PPM infection Last Day of Therapy:  May 10, 2018 Labs - Once weekly:  CBC/D and BMP, Labs - Every other week:  ESR and CRP 04/02/18   Thurnell Lose, MD  cholecalciferol (VITAMIN D) 1000 units tablet Take 1,000 Units by mouth 2 (two) times a week.     [provider]  furosemide (LASIX) 40 MG tablet Take 1 tablet (40 mg total) by mouth daily. 04/20/18   Dorothy Spark, MD  isosorbide mononitrate (IMDUR) 60 MG 24 hr tablet Take 1 tablet (60 mg total) by mouth daily. 01/20/18   Kroeger, Lorelee Cover., PA-C  lansoprazole (PREVACID) 15 MG capsule Take 1 capsule (15 mg total) by mouth daily at 12 noon. Patient taking differently: Take 15 mg by mouth daily as needed (heartburn.).  01/28/18   Dorothy Spark, MD  latanoprost (XALATAN) 0.005 % ophthalmic solution Place 1 drop into both eyes at bedtime.    [provider]  levothyroxine (SYNTHROID, LEVOTHROID) 25 MCG tablet Take 0.5 tablets (12.5 mcg total) by mouth daily before breakfast. 10/21/17   Dorothy Spark, MD  menthol-cetylpyridinium (CEPACOL) 3 MG lozenge Take 1 lozenge (3 mg total) by mouth as needed for sore throat. 10/24/17   Lavina Hamman, MD  metoprolol succinate (TOPROL-XL) 100 MG 24 hr tablet Take 1 tablet (100 mg total) by mouth daily. Take with or immediately following a meal. 01/20/18   Kroeger, Daleen Snook M., PA-C  Netarsudil Dimesylate (RHOPRESSA) 0.02 % SOLN Place 1 drop into both eyes at bedtime.    [provider]  nitroGLYCERIN (NITROSTAT) 0.4 MG SL tablet Place 1 tablet (0.4 mg total) under the tongue every 5 (five) minutes x 3 doses as needed for chest pain. 01/19/18   Kroeger, Lorelee Cover., PA-C  polyethylene glycol (MIRALAX / GLYCOLAX) packet Take 17 g by mouth daily. Patient  taking differently: Take 17 g by mouth daily as needed for moderate constipation.  11/14/17   Bonnell Public, MD  senna (SENOKOT) 8.6 MG TABS tablet Take 1 tablet (8.6 mg total) by mouth daily as needed for mild constipation. 11/14/17   Bonnell Public, MD  vitamin B-12 (CYANOCOBALAMIN) 1000 MCG tablet Take 1,000 mcg by mouth daily.    [provider]    Family History Family History  Problem Relation Age of Onset  . Heart disease Mother   . Heart disease Father     Social History Social History   Tobacco Use  . Smoking status: Never Smoker  . Smokeless tobacco: Never Used  Substance Use Topics  . Alcohol use:  No    Alcohol/week: 0.0 standard drinks    Comment: 04/30/2016 "nothing in years"  . Drug use: No     Allergies   Ciprofloxacin; Hydrochlorothiazide; Sulfa antibiotics; Ace inhibitors; Losartan; Carvedilol; Lisinopril; and Soy allergy   Review of Systems Review of Systems  Constitutional: Positive for fatigue. Negative for fever.  HENT: Negative for rhinorrhea and sore throat.   Eyes: Negative for redness.  Respiratory: Positive for shortness of breath. Negative for cough.   Cardiovascular: Negative for chest pain.  Gastrointestinal: Positive for blood in stool. Negative for abdominal pain, diarrhea, nausea and vomiting.  Genitourinary: Negative for dysuria.  Musculoskeletal: Negative for myalgias.  Skin: Negative for rash.  Neurological: Positive for weakness (generalized). Negative for headaches.     Physical Exam Updated Vital Signs BP 134/60 (BP Location: Right Arm)   Pulse 79   Temp 98.1 F (36.7 C) (Oral)   Resp 18   Ht 5' 6"  (1.676 m)   Wt 61.2 kg   SpO2 98%   BMI 21.78 kg/m   Physical Exam  Constitutional: He appears well-developed and well-nourished.  HENT:  Head: Normocephalic and atraumatic.  Slightly pale conjunctiva  Eyes: Conjunctivae are normal. Right eye exhibits no discharge. Left eye exhibits no discharge.  Neck:  Normal range of motion. Neck supple.  Cardiovascular: Normal rate, regular rhythm and normal heart sounds.  Pulmonary/Chest: Effort normal and breath sounds normal. He has no decreased breath sounds. He has no wheezes. He has no rhonchi. He has no rales.  Abdominal: Soft. He exhibits no mass. There is no tenderness. There is no rebound and no guarding.  Musculoskeletal:       Right lower leg: He exhibits no tenderness and no edema.       Left lower leg: He exhibits no tenderness and no edema.  Neurological: He is alert.  Skin: Skin is warm and dry.  Psychiatric: He has a normal mood and affect.  Nursing note and vitals reviewed.    ED Treatments / Results  Labs (all labs ordered are listed, but only abnormal results are displayed) Labs Reviewed  COMPREHENSIVE METABOLIC PANEL - Abnormal; Notable for the following components:      Result Value   Glucose, Bld 109 (*)    BUN 48 (*)    Creatinine, Ser 3.21 (*)    Total Protein 6.2 (*)    Albumin 3.2 (*)    GFR calc non Af Amer 17 (*)    GFR calc Af Amer 19 (*)    All other components within normal limits  CBC - Abnormal; Notable for the following components:   RBC 2.69 (*)    Hemoglobin 7.9 (*)    HCT 25.5 (*)    All other components within normal limits  PROTIME-INR  I-STAT TROPONIN, ED  POC OCCULT BLOOD, ED  TYPE AND SCREEN  PREPARE RBC (CROSSMATCH)    EKG None  Radiology Dg Chest 2 View  Result Date: 04/28/2018 CLINICAL DATA:  Dyspnea, chest pain, anemia EXAM: CHEST - 2 VIEW COMPARISON:  03/28/2018 chest radiograph. FINDINGS: Stable configuration of 2 lead left subclavian pacemaker and aortic valve prosthesis. Right PICC terminates in middle third of the SVC. Stable cardiomediastinal silhouette with top-normal heart size. No pneumothorax. No pleural effusion. Lungs appear clear, with no acute consolidative airspace disease and no pulmonary edema. IMPRESSION: No active cardiopulmonary disease. Electronically Signed   By:  Ilona Sorrel M.D.   On: 04/28/2018 18:55    Procedures Procedures (including critical care  time)  Medications Ordered in ED Medications  0.9 %  sodium chloride infusion (has no administration in time range)  ampicillin (OMNIPEN) 2 g in sodium chloride 0.9 % 100 mL IVPB (has no administration in time range)     Initial Impression / Assessment and Plan / ED Course  I have reviewed the triage vital signs and the nursing notes.  Pertinent labs & imaging results that were available during my care of the patient were reviewed by me and considered in my medical decision making (see chart for details).     Patient seen and examined. Work-up initiated.  Patient has symptomatic anemia due to likely slow GI bleeding.  Current plan is to initiate blood transfusion.  Will reassess tomorrow and get GI input when response to blood is known.   She discussed with Dr. Wilson Singer who will see patient.  Vital signs reviewed and are as follows: BP 134/60 (BP Location: Right Arm)   Pulse 79   Temp 98.1 F (36.7 C) (Oral)   Resp 18   Ht 5' 6"  (1.676 m)   Wt 61.2 kg   SpO2 98%   BMI 21.78 kg/m   8:20 PM Spoke with Dr. Blaine Hamper of [facility] who will see/admit.   Final Clinical Impressions(s) / ED Diagnoses   Final diagnoses:  Symptomatic anemia  Gastrointestinal hemorrhage, unspecified gastrointestinal hemorrhage type   Admit, symptomatic anemia.   ED Discharge Orders    None       Carlisle Cater, Hershal Coria 04/28/18 2022    Virgel Manifold, MD 05/05/18 [phone]

## 2018-04-28 NOTE — ED Notes (Signed)
Pt returns from imaging. Son at bedside.

## 2018-04-28 NOTE — ED Notes (Addendum)
Pt has double lumen  PICC at right  [facility].

## 2018-04-29 DIAGNOSIS — D509 Iron deficiency anemia, unspecified: Secondary | ICD-10-CM

## 2018-04-29 DIAGNOSIS — N179 Acute kidney failure, unspecified: Secondary | ICD-10-CM | POA: Diagnosis not present

## 2018-04-29 DIAGNOSIS — D649 Anemia, unspecified: Secondary | ICD-10-CM | POA: Diagnosis not present

## 2018-04-29 DIAGNOSIS — R195 Other fecal abnormalities: Secondary | ICD-10-CM | POA: Diagnosis not present

## 2018-04-29 DIAGNOSIS — Z7901 Long term (current) use of anticoagulants: Secondary | ICD-10-CM

## 2018-04-29 DIAGNOSIS — R7881 Bacteremia: Secondary | ICD-10-CM | POA: Diagnosis not present

## 2018-04-29 DIAGNOSIS — K922 Gastrointestinal hemorrhage, unspecified: Secondary | ICD-10-CM | POA: Diagnosis not present

## 2018-04-29 LAB — BASIC METABOLIC PANEL
Anion gap: 11 (ref 5–15)
BUN: 44 mg/dL — ABNORMAL HIGH (ref 8–23)
CALCIUM: 8.8 mg/dL — AB (ref 8.9–10.3)
CHLORIDE: 105 mmol/L (ref 98–111)
CO2: 25 mmol/L (ref 22–32)
CREATININE: 2.93 mg/dL — AB (ref 0.61–1.24)
GFR calc Af Amer: 22 mL/min — ABNORMAL LOW (ref >60)
GFR calc non Af Amer: 19 mL/min — ABNORMAL LOW (ref >60)
GLUCOSE: 94 mg/dL (ref 70–99)
Potassium: 4 mmol/L (ref 3.5–5.1)
Sodium: 141 mmol/L (ref 135–145)

## 2018-04-29 LAB — PROTIME-INR
INR: 1.17
Prothrombin Time: 14.8 seconds (ref 11.4–15.2)

## 2018-04-29 LAB — CBC
HCT: 31.8 % — ABNORMAL LOW (ref 39.0–52.0)
HCT: 32.2 % — ABNORMAL LOW (ref 39.0–52.0)
Hemoglobin: 10.3 g/dL — ABNORMAL LOW (ref 13.0–17.0)
Hemoglobin: 10.4 g/dL — ABNORMAL LOW (ref 13.0–17.0)
MCH: 28.9 pg (ref 26.0–34.0)
MCH: 28.9 pg (ref 26.0–34.0)
MCHC: 32.4 g/dL (ref 30.0–36.0)
MCHC: 32.3 g/dL (ref 30.0–36.0)
MCV: 89.1 fL (ref 80.0–100.0)
MCV: 89.4 fL (ref 80.0–100.0)
NRBC: 0 % (ref 0.0–0.2)
PLATELETS: 171 10*3/uL (ref 150–400)
Platelets: 181 10*3/uL (ref 150–400)
RBC: 3.57 MIL/uL — ABNORMAL LOW (ref 4.22–5.81)
RBC: 3.6 MIL/uL — ABNORMAL LOW (ref 4.22–5.81)
RDW: 15.4 % (ref 11.5–15.5)
RDW: 15.7 % — AB (ref 11.5–15.5)
WBC: 6.3 10*3/uL (ref 4.0–10.5)
WBC: 6.2 10*3/uL (ref 4.0–10.5)
nRBC: 0 % (ref 0.0–0.2)

## 2018-04-29 LAB — PREPARE RBC (CROSSMATCH)

## 2018-04-29 LAB — IRON,TIBC AND FERRITIN PANEL
%SAT: 8 % — AB (ref 20–48)
FERRITIN: 17 ng/mL — AB (ref 24–380)
IRON: 25 ug/dL — AB (ref 50–180)
TIBC: 301 mcg/dL (calc) (ref 250–425)

## 2018-04-29 LAB — APTT: aPTT: 31 seconds (ref 24–36)

## 2018-04-29 MED ORDER — SODIUM CHLORIDE 0.9% FLUSH
10.0000 mL | INTRAVENOUS | Status: DC | PRN
  Administered 2018-04-29 – 2018-05-02 (×2): 20 mL
  Filled 2018-04-29 (×2): qty 40

## 2018-04-29 MED ORDER — ALBUTEROL SULFATE (2.5 MG/3ML) 0.083% IN NEBU: 2.5000 mg | INHALATION_SOLUTION | RESPIRATORY_TRACT | Status: DC | PRN

## 2018-04-29 MED ORDER — HALOPERIDOL LACTATE 5 MG/ML IJ SOLN: 1.0000 mg | Freq: Four times a day (QID) | INTRAMUSCULAR | Status: DC | PRN

## 2018-04-29 NOTE — Consult Note (Signed)
Gastroenterology Inpatient Consultation   Attending Requesting Consult Charlynne Cousins, [place] Hospital Day: 2  Reason for Consult Anemia in setting of +FOBT   History of Present Illness  Ryan [name] is a 84 y.o. male with a pmh significant for s/p TAVR with recurrent Endocarditis, CHF, A. fib (on Eliquis), PVD, Diverticulosis, HTN, HLD, hx of Prostate Cancer.  The GI service is consulted for evaluation and management of anemia in the setting of positive FOBT.  The patient states that over the course of the last week and 1/2 to 2 weeks that he had noted a slight change in the color of his stool.  His stool was brown and slightly darker brown than slightly darker brown then very dark brown and potentially near black.  However he has not had any true melanic stools and no overt liquid stools.  His bowel habits have not really changed in regards to the number or frequency of times he was going.  However with the course the last few weeks as well he has had an increasing dyspnea on exertion.  He has had issues of a chest burning that occurs at the time of early morning IV antibiotics being administered as well as evening antibiotics being administered.  Is not clear that this is been true angina versus heartburn but he does describe some issues of potentially mild transit dysphagia where he has to put his chin downwards to try and help allow food to pass.  Sometimes he feels like food may stick in the chest but that is not very frequent.  He has been on PPI therapy.  He came in for further evaluation after his blood counts were found to be low his primary care provider's office.  He has been taking Eliquis for his history of atrial fibrillation.  His last dose of Eliquis was yesterday in the morning.  The patient does not take any significant nonsteroidals or BC/Goody powders.  He has had a prior colonoscopy more than 10 to 15 years ago (per Dr. Loletha Carrow he has had a difficult colon with  significant diverticular disease because of adhesions in his abdomen from a surgery when he was a child).  He has no symptoms of diverticulitis or abdominal symptoms otherwise.  The patient is feeling much better after coming in and receiving 2 units of packed red blood cells with an appropriate response.  GI Review of Systems Positive as above very minimal amount of abdominal bloating Negative for odynophagia, early satiety, hematochezia   Problem List   Patient Active Problem List   Diagnosis Date Noted  . GIB (gastrointestinal bleeding) 04/28/2018  . HLD (hyperlipidemia) 04/28/2018  . Asthma 04/28/2018  . Hypothyroidism 04/28/2018  . Chronic diastolic CHF (congestive heart failure) ([place]) 04/28/2018  . Symptomatic anemia 04/28/2018  . Acute renal failure superimposed on stage 4 chronic kidney disease ([place]) 04/28/2018  . Medication monitoring encounter 04/22/2018  . Presence of permanent cardiac pacemaker 03/29/2018  . Bacteremia due to Enterococcus 03/29/2018  . Hypertensive urgency   . Unstable angina ([place]) 01/15/2018  . Claudication in peripheral vascular disease ([place]) 01/01/2018  . S/P AVR 12/02/2017  . 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 Intractable back pain 11/07/2017  . Elevated troponin 11/07/2017  . Urinary retention 11/07/2017  . Hypertension   . Syncope 10/23/2017  . Decreased diffusion capacity 10/23/2017  . 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 Elevated TSH 06/30/2017  . Anticoagulation management encounter 08/19/2016  . CAD (coronary artery  disease), native coronary artery 04/30/2016  . Tachy-brady syndrome ([place])   . Hypertensive heart disease with heart failure ([place])   . Pure hypercholesterolemia   . PAF (paroxysmal atrial fibrillation) ([place]) 03/18/2016  . Chronic combined systolic (congestive) and diastolic (congestive) heart failure ([place]) 03/18/2016  . Atrial fibrillation with rapid ventricular response ([place]) 03/18/2016  . DOE (dyspnea  on exertion) 01/17/2016  . Coronary artery disease of native artery of native heart with stable angina pectoris [facility])      Histories  Past Medical History Past Medical History:  Diagnosis Date  . Age-related macular degeneration   . Allergic rhinitis   . 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 Arthritis    "hands, lower back" (04/30/2016)  . Asthma   . Bacterial endocarditis   . Carotid artery obstruction    a. s/p bilat CEA.  . Chronic combined systolic (congestive) and diastolic (congestive) heart failure ([place]) 03/18/2016  . Chronic lower back pain   . Claudication in peripheral vascular disease ([place]) 01/01/2018   Claudication  . Coronary arteriosclerosis in native artery    a. 2017 Cardiac catheterization demonstrated worsening CAD with 70% mid LCx, 80% OM1 and 80% mid RCA stenosis. b.  In 11/17, he underwent successful rotational atherectomy and DES to RCA.  Marland Kitchen Decreased diffusion capacity 10/23/2017  . Diverticulitis of colon   . DJD (degenerative joint disease)   . Elevated TSH 06/30/2017  . GERD (gastroesophageal reflux disease)   . History of hiatal hernia   . HLD (hyperlipidemia)   . Hypertension   . Hypertensive heart disease with heart failure ([place])   . Intractable back pain 11/07/2017  . OSA (obstructive sleep apnea)    intolerant to CPAP  . PAF (paroxysmal atrial fibrillation) ([place])   . Paroxysmal supraventricular tachycardia ([place])   . Presence of permanent cardiac pacemaker   . Primary malignant neoplasm of bladder ([place])   . Prostate cancer ([place])   . 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 S/P AVR 12/02/2017  . Syncope 10/23/2017  . Tachy-brady syndrome ([place])   . Thrombocytopenia ([place]) 11/07/2017  . Urinary retention 11/07/2017   Past Surgical History:  Procedure Laterality Date  . APPENDECTOMY    . CARDIAC CATHETERIZATION N/A 01/17/2016   Procedure: Right/Left Heart Cath and Coronary Angiography;  Surgeon: Belva Crome, MD;   Location: [place] [facility];  Service: Cardiovascular;  Laterality: N/A;  . CARDIAC CATHETERIZATION N/A 04/30/2016   Procedure: Coronary/Graft Atherectomy;  Surgeon: Sherren Mocha, MD;  Location: [place] [facility];  Service: Cardiovascular;  Laterality: N/A;  . CAROTID ENDARTERECTOMY Bilateral   . CATARACT EXTRACTION W/ INTRAOCULAR LENS  IMPLANT, BILATERAL Bilateral   . COLON SURGERY  1981   Meckles Diverticulum with volvulus  . CORONARY ANGIOGRAPHY N/A 01/18/2018   Procedure: CORONARY ANGIOGRAPHY;  Surgeon: Lorretta Harp, MD;  Location: [place] [facility];  Service: Cardiovascular;  Laterality: N/A;  . EP IMPLANTABLE DEVICE N/A 03/21/2016   Procedure: Pacemaker Implant;  Surgeon: Evans Lance, MD;  Location: [place] [facility];  Service: Cardiovascular;  Laterality: N/A;  . EYE SURGERY    . INGUINAL HERNIA REPAIR Right 2009  . INSERT / REPLACE / REMOVE PACEMAKER    . INSERTION PROSTATE RADIATION SEED    . TEE WITHOUT CARDIOVERSION N/A 06/10/2016   Procedure: TRANSESOPHAGEAL ECHOCARDIOGRAM (TEE);  Surgeon: Sherren Mocha, MD;  Location: [place];  Service: Open Heart Surgery;  Laterality: N/A;  . TEE WITHOUT CARDIOVERSION N/A 11/10/2017  Procedure: TRANSESOPHAGEAL ECHOCARDIOGRAM (TEE);  Surgeon: Nahser, Philip J, MD;  Location: [facility];  Service: Cardiovascular;  Laterality: N/A;  . TEE WITHOUT CARDIOVERSION N/A 12/15/2017   Procedure: TRANSESOPHAGEAL ECHOCARDIOGRAM (TEE);  Surgeon: Christopher, Bridgette, MD;  Location: [facility];  Service: Cardiovascular;  Laterality: N/A;  . TEE WITHOUT CARDIOVERSION N/A 03/30/2018   Procedure: TRANSESOPHAGEAL ECHOCARDIOGRAM (TEE);  Surgeon: Skains, Mark C, MD;  Location: [facility];  Service: Cardiovascular;  Laterality: N/A;  . TONSILLECTOMY  ~ 1947  . TRANSCATHETER AORTIC VALVE REPLACEMENT, TRANSFEMORAL N/A 06/10/2016   Procedure: TRANSCATHETER AORTIC VALVE REPLACEMENT, TRANSFEMORAL;  Surgeon: Michael Cooper, MD;  Location: [facility];  Service:  Open Heart Surgery;  Laterality: N/A;    Allergies Allergies  Allergen Reactions  . Ciprofloxacin Rash  . Hydrochlorothiazide Rash  . Sulfa Antibiotics Anaphylaxis  . Ace Inhibitors Cough  . Losartan Cough  . Carvedilol Cough    Pt states it "causes him too cough."  . Lisinopril Cough    Pt reports worsening cough and "feeling wheezy."  . Soy Allergy Nausea And Vomiting   Family History Family History  Problem Relation Age of Onset  . Heart disease Mother   . Heart disease Father   The patient's FH is negative for IBD/GI Malignancies.  Social History Social History   Socioeconomic History  . Marital status: Married    Spouse name: Not on file  . Number of children: Not on file  . Years of education: Not on file  . Highest education level: Not on file  Occupational History  . Occupation: retired  Social Needs  . Financial resource strain: Not on file  . Food insecurity:    Worry: Not on file    Inability: Not on file  . Transportation needs:    Medical: Not on file    Non-medical: Not on file  Tobacco Use  . Smoking status: Never Smoker  . Smokeless tobacco: Never Used  Substance and Sexual Activity  . Alcohol use: No    Alcohol/week: 0.0 standard drinks    Comment: 04/30/2016 "nothing in years"  . Drug use: No  . Sexual activity: Never  Lifestyle  . Physical activity:    Days per week: Not on file    Minutes per session: Not on file  . Stress: Not on file  Relationships  . Social connections:    Talks on phone: Not on file    Gets together: Not on file    Attends religious service: Not on file    Active member of club or organization: Not on file    Attends meetings of clubs or organizations: Not on file    Relationship status: Not on file  . Intimate partner violence:    Fear of current or ex partner: Not on file    Emotionally abused: Not on file    Physically abused: Not on file    Forced sexual activity: Not on file  Other Topics Concern  .  Not on file  Social History Narrative   Retired    Three children - One in [place], One in [place], and one in [place].        Medications  Home Medications No current facility-administered medications on file prior to encounter.    Current Outpatient Medications on File Prior to Encounter  Medication Sig Dispense Refill  . acetaminophen-codeine (TYLENOL #3) 300-30 MG tablet Take 0.5-1 tablets by mouth every 4 (four) hours as needed for moderate pain. 15 tablet 0  . amLODipine (NORVASC)   5 MG tablet Take 1 tablet (5 mg total) by mouth daily. 30 tablet 3  . ampicillin IVPB Inject 2 g into the vein every 8 (eight) hours. As a continuous infusion. Indication: PVE/PPM infection Last Day of Therapy:  May 10, 2018 Labs - Once weekly:  CBC/D and BMP, Labs - Every other week:  ESR and CRP 117 Units 0  . apixaban (ELIQUIS) 2.5 MG TABS tablet Take 1 tablet (2.5 mg total) by mouth 2 (two) times daily. 180 tablet 3  . atorvastatin (LIPITOR) 40 MG tablet Take 1 tablet (40 mg total) by mouth daily at 6 PM. 90 tablet 3  . cefTRIAXone (ROCEPHIN) IVPB Inject 2 g into the vein every 12 (twelve) hours. Indication:  PVE/PPM infection Last Day of Therapy:  May 10, 2018 Labs - Once weekly:  CBC/D and BMP, Labs - Every other week:  ESR and CRP 78 Units 0  . cholecalciferol (VITAMIN D) 1000 units tablet Take 1,000 Units by mouth 2 (two) times a week.     . furosemide (LASIX) 40 MG tablet Take 1 tablet (40 mg total) by mouth daily. (Patient taking differently: Take 40 mg by mouth daily as needed for fluid. ) 90 tablet 2  . isosorbide mononitrate (IMDUR) 60 MG 24 hr tablet Take 1 tablet (60 mg total) by mouth daily. 30 tablet 3  . latanoprost (XALATAN) 0.005 % ophthalmic solution Place 1 drop into both eyes at bedtime.    Marland Kitchen levothyroxine (SYNTHROID, LEVOTHROID) 25 MCG tablet Take 0.5 tablets (12.5 mcg total) by mouth daily before breakfast. 45 tablet 3  . metoprolol succinate (TOPROL-XL) 100 MG 24 hr tablet  Take 1 tablet (100 mg total) by mouth daily. Take with or immediately following a meal. 90 tablet 3  . Netarsudil Dimesylate (RHOPRESSA) 0.02 % SOLN Place 1 drop into both eyes at bedtime.    . nitroGLYCERIN (NITROSTAT) 0.4 MG SL tablet Place 1 tablet (0.4 mg total) under the tongue every 5 (five) minutes x 3 doses as needed for chest pain. 25 tablet 3  . omeprazole (PRILOSEC) 20 MG capsule Take 20 mg by mouth daily.    . polyethylene glycol (MIRALAX / GLYCOLAX) packet Take 17 g by mouth daily. (Patient taking differently: Take 17 g by mouth daily as needed for moderate constipation. ) 14 each 0  . vitamin B-12 (CYANOCOBALAMIN) 1000 MCG tablet Take 1,000 mcg by mouth daily.    Derrill Memo ON 05/12/2018] amoxicillin (AMOXIL) 500 MG tablet Take 1 tablet (500 mg total) by mouth 2 (two) times daily. 60 tablet 11   Scheduled Inpatient Medications . amLODipine  5 mg Oral Daily  . atorvastatin  40 mg Oral q1800  . cholecalciferol  1,000 Units Oral Once per day on Mon Thu  . isosorbide mononitrate  60 mg Oral Daily  . latanoprost  1 drop Both Eyes QHS  . levothyroxine  12.5 mcg Oral QAC breakfast  . metoprolol succinate  100 mg Oral Daily  . Netarsudil Dimesylate  1 drop Both Eyes QHS  . pantoprazole  40 mg Oral BID  . vitamin B-12  1,000 mcg Oral Daily   Continuous Inpatient Infusions . ampicillin (OMNIPEN) IV Stopped (04/29/18 0720)  . cefTRIAXone (ROCEPHIN)  IV 2 g (04/29/18 7989)   PRN Inpatient Medications acetaminophen **OR** acetaminophen, acetaminophen-codeine, albuterol, hydrALAZINE, nitroGLYCERIN, ondansetron **OR** ondansetron (ZOFRAN) IV, polyethylene glycol, sodium chloride flush, zolpidem   Review of Systems  General: Positive for fatigue and weight loss; denies current fevers or chills HEENT:  Denies oral lesions Cardiovascular: Denies chest pain/palpitations Pulmonary: Shortness of breath at baseline Gastroenterological: See HPI Genitourinary: Denies darkened  urine Hematological: Positive for easy bruising/bleeding Psychological: Mood is anxious about being in the hospital again but hopeful to establish a diagnosis   Physical Examination  BP (!) 127/58   Pulse 66   Temp 98.2 F (36.8 C) (Oral)   Resp 17   Ht 5' 6" (1.676 m)   Wt 61.3 kg   SpO2 95%   BMI 21.81 kg/m  GEN: Elderly, appears stated age, accompanied at bedside by wife PSYCH: Cooperative, without pressured speech EYE: Conjunctivae pale pink, sclera anicteric ENT: MMM, without oral ulcers, no erythema or exudates noted NECK: Supple  CV: Without rubs or gallops, systolic murmur appreciated RESP: CTAB posteriorly, without wheezing GI: NABS, soft, rounded, NT/ND, without rebound or guarding GU: DRE not performed MSK/EXT: Trace bilateral lower extremity edema SKIN: No jaundice NEURO:  Alert & Oriented x 3, no focal deficits   Review of Data  I reviewed the following data at the time of this encounter:  Laboratory Studies   Recent Labs  Lab 04/29/18 0805  NA 141  K 4.0  CL 105  CO2 25  BUN 44*  CREATININE 2.93*  GLUCOSE 94  CALCIUM 8.8*   Recent Labs  Lab 04/28/18 1752  AST 21  ALT 11  ALKPHOS 54    Recent Labs  Lab 04/28/18 0939 04/28/18 1752 04/29/18 0805  WBC 5.9 6.0 6.3  HGB 7.8 Repeated and verified X2.* 7.9* 10.3*  HCT 23.5 Repeated and verified X2.* 25.5* 31.8*  PLT 191.0 199 171   Recent Labs  Lab 04/28/18 2013 04/29/18 1003  APTT  --  31  INR 1.27  --    Imaging Studies  June 2019 CT abdomen without contrast IMPRESSION: 1. Moderate stool throughout the colon. There is a relative transition point in the sigmoid without obstruction. This could be due to adhesions. 2. No other acute or focal lesion to explain abdominal distention or leukocytosis. 3. Right greater than left basilar airspace disease likely reflects atelectasis. Infection is not excluded on the right. 4. Aortic Atherosclerosis (ICD10-I70.0). No significant aneurysm is  present. 5. Coronary artery disease. 6. TAVR 7. Mild inflammatory changes about the kidneys and in the mesentery likely reflects mild edema.  GI Procedures and Studies  No relevant studies to review  Assessment  Ryan [name] is a 84 y.o. male with a pmh significant for s/p TAVR with recurrent Endocarditis, CHF, A. fib (on Eliquis), PVD, Diverticulosis, HTN, HLD, hx of Prostate Cancer.  The GI service is consulted for evaluation and management of anemia in the setting of positive FOBT.  The patient is hemodynamically stable.  At this point in time the etiology of his occult blood loss is not completely clear.  The changes in bowel habits over the course the last couple weeks would suggest potentially an upper source of GI bleeding however as we know sometimes lower sources of GI bleeding can be causes of occult anemia as well.  His Eliquis has been stopped as of today but last dose was yesterday.  Due to his hemodynamic stability and appropriate response to 2 units of packed RBCs I think it is reasonable for the patient to be monitored and allow Eliquis to washout completely.  We will plan for an EGD/SBE to be performed tomorrow and evaluation of possible source of occult GI blood loss.  We will maintain him on twice daily PPI.  Even though   his aortic valve has been replaced I think he still may be at risk of angiectasia is in the small bowel so an enteroscopy would be ideal if an upper does not show a source.  If the upper evaluation is negative we will plan a colonoscopy and further evaluation including possible capsule endoscopy based on findings and discussions with family thereafter.  We briefly discussed considering doing an endoscopy and colonoscopy at the same time but due to the patient's multiple medical comorbidities and reported difficult colonoscopy and his overall health at this point in time I would like to start and began with just an upper evaluation if possible to find his source.  The  risks and benefits of endoscopic evaluation were discussed with the patient; these include but are not limited to the risk of perforation, infection, bleeding, missed lesions, lack of diagnosis, severe illness requiring hospitalization, as well as anesthesia and sedation related illnesses.  The patient is agreeable to proceed.  All patient questions were answered, to the best of my ability, and the patient agrees to the aforementioned plan of action with follow-up as indicated.   Plan/Recommendations  Please maintain NPO Status _0  Plan for tentative EGD on 11/29 Please obtain early AM CBC, INR Hold Heparin/VTE PPx Continue PPI BID   Discharge Planning Diet: NPO @ midnight Anticoagulation and antiplatelets:Holding eliquis Discharge Medications: N/A Follow up: N/A  Procedure Procedures planned and timing of procedure: EGD/SBE tomorrow Lab work ordered: Per [facility] Hold the following anticoagulation and/or antiplatelets for procedure? Holding currently   Thank you for this consult.  We will continue to follow.  Please page/call with questions or concerns.   Justice Britain, MD [facility] Office # [phone]

## 2018-04-29 NOTE — Progress Notes (Signed)
[facility]  Active pt with [facility] [place] and [facility] for Home IV ABX.   Ampicillin 2gms Q8hrs  and Rocephin 2gms Q12hrs  both thru 05/10/18.  We will follow and support DC home when deemed ready.   If patient discharges after hours, please call [phone].   Larry Sierras 04/29/2018, 9:55 AM

## 2018-04-29 NOTE — Progress Notes (Signed)
[facility] PROGRESS NOTE    Progress Note  Ryan [name]  TDD:[id number] DOB: 29-Apr-1930 DOA: 04/28/2018 PCP: Dorothyann Peng, NP     Brief Narrative:   Ryan [name] is an 84 y.o. 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 Relates [name] has had intermittent dark stools for 2 weeks. Colonoscopy was 15 years ago which showed severe diverticulosis. Awaiting GI recommendations. Hold Eliquis, agree with type and screen, check a hemoglobin post transfusional. We will place n.p.o. for possible EGD. Continue IV Protonix twice daily. Avoid other anticoagulants, please place 2 large-bore's IV  Chronic atrial fibrillation: CHA2DS2-VASc is 5. We will hold Eliquis, rate controlled continue metoprolol.  Hyperlipidemia: Continue Lipitor.  History of CAD: Denies any chest painh, continue Lipitor Imdur and nitroglycerin as needed.  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  Continue IV antibiotics for 2 additional weeks.  Asthma: Continue inhalers.  Hypothyroidism: Continue in 3.  Chronic diastolic heart failure: Last 2D echo on 03/30/2018 EF showed 55% with aortic valve regurgitation. Seems to be dry on physical exam. Hold diuretics.  His baseline creatinine is around 2.3-2.8.  ACute kidney injury on chronic kidney disease stage III: With a baseline creatinine of 2.3-2.8. On admission 2.3 question due to GI bleed plus or minus dehydration. Hold diuretics, agree  with volume expansion. Hold Lasix.  Presence of permanent cardiac pacemaker     DVT prophylaxis: SCD Family Communication:wife Disposition Plan/Barrier to D/C: once stable. Code Status:     Code Status Orders  (From admission, onward)         Start     Ordered   04/28/18 2047  Full code  Continuous     04/28/18 2047        Code Status History    Date Active Date Inactive Code Status Order ID Comments User Context   03/29/2018 1300 04/02/2018 1704 Full Code [id number]  Karmen Bongo, MD ED   01/15/2018 1540 01/19/2018 1724 Full Code [id number]  Abigail Butts., PA-C Inpatient   11/07/2017 1201 11/14/2017 1709 Full Code [id number]  Radene Gunning, NP ED   10/23/2017 1758 10/24/2017 2309 Full Code [id number]  Lavina Hamman, MD ED   06/10/2016 1003 06/12/2016 1605 Full Code [id number]  Sherren Mocha, MD Inpatient   04/30/2016 1328 05/01/2016 1512 Full Code [id number]  Sherren Mocha, MD Inpatient   03/18/2016 1506 03/22/2016 1615 Full Code [id number]  Eileen Stanford, PA-C Inpatient   03/18/2016 1205 03/18/2016 1506 Full Code [id number]  Belva Crome, MD ED   01/17/2016 1003 01/17/2016 1649 Full Code [id number]  Belva Crome, MD Inpatient        IV Access:    Peripheral IV   Procedures and diagnostic studies:   Dg Chest 2 View  Result Date: 04/28/2018 CLINICAL DATA:  Dyspnea, chest pain, anemia EXAM: CHEST - 2 VIEW COMPARISON:  03/28/2018 chest radiograph. FINDINGS: Stable configuration of 2 lead left subclavian pacemaker and aortic valve prosthesis. Right PICC terminates in middle third of the SVC. Stable cardiomediastinal silhouette with top-normal heart size. No pneumothorax. No pleural effusion. Lungs appear clear,  with no acute consolidative airspace disease and no pulmonary edema. IMPRESSION: No active cardiopulmonary disease. Electronically Signed   By: Ilona Sorrel M.D.   On: 04/28/2018 18:55     Medical Consultants:    None.  Anti-Infectives:   IV  ampicillin and Rocephin.  Subjective:    Ryan [name] feels great [name] relates some burning retrosternally  Objective:    Vitals:   04/29/18 0307 04/29/18 0329 04/29/18 0538 04/29/18 0548  BP: (!) 131/58 (!) 135/59  (!) 127/58  Pulse: 64 65  66  Resp:  17  17  Temp: 98.1 F (36.7 C) 98.4 F (36.9 C)  98.2 F (36.8 C)  TempSrc: Oral Oral  Oral  SpO2: 94%   95%  Weight:   61.3 kg   Height:        Intake/Output Summary (Last 24 hours) at 04/29/2018 0743 Last data filed at 04/29/2018 0700 Gross per 24 hour  Intake 919 ml  Output 451 ml  Net 468 ml   Filed Weights   04/28/18 1740 04/28/18 2215 04/29/18 0538  Weight: 61.2 kg 61.5 kg 61.3 kg    Exam: General exam: In no acute distress in a good mood. Respiratory system: Good air movement and clear to auscultation. Cardiovascular system: S1 & S2 heard, RRR. Gastrointestinal system: Abdomen is nondistended, soft and nontender.  Central nervous system: Alert and oriented. No focal neurological deficits. Extremities: No pedal edema. Skin: No rashes, lesions or ulcers Psychiatry: Judgement and insight appear normal. Mood & affect appropriate.    Data Reviewed:    Labs: Basic Metabolic Panel: Recent Labs  Lab 04/28/18 1752  NA 139  K 4.3  CL 103  CO2 27  GLUCOSE 109*  BUN 48*  CREATININE 3.21*  CALCIUM 9.1   GFR Estimated Creatinine Clearance: 14.6 mL/min (A) (by C-G formula based on SCr of 3.21 mg/dL (H)). Liver Function Tests: Recent Labs  Lab 04/28/18 1752  AST 21  ALT 11  ALKPHOS 54  BILITOT 0.3  PROT 6.2*  ALBUMIN 3.2*   No results for input(s): LIPASE, AMYLASE in the last 168 hours. No results for input(s): AMMONIA in the last 168 hours. Coagulation profile Recent Labs  Lab 04/28/18 2013  INR 1.27    CBC: Recent Labs  Lab 04/28/18 0939 04/28/18 1752  WBC 5.9 6.0  NEUTROABS 3.9  --   HGB 7.8 Repeated and verified X2.* 7.9*  HCT 23.5 Repeated and verified X2.* 25.5*  MCV 91.4  94.8  PLT 191.0 199   Cardiac Enzymes: No results for input(s): CKTOTAL, CKMB, CKMBINDEX, TROPONINI in the last 168 hours. BNP (last 3 results) Recent Labs    10/20/17 1058 01/14/18 1309  PROBNP 146.0* 3,168*   CBG: No results for input(s): GLUCAP in the last 168 hours. D-Dimer: No results for input(s): DDIMER in the last 72 hours. Hgb A1c: No results for input(s): HGBA1C in the last 72 hours. Lipid Profile: No results for input(s): CHOL, HDL, LDLCALC, TRIG, CHOLHDL, LDLDIRECT in the last 72 hours. Thyroid function studies: No results for input(s): TSH, T4TOTAL, T3FREE, THYROIDAB in the last 72 hours.  Invalid input(s): FREET3 Anemia work up: Recent Labs    04/28/18 0939  FERRITIN 17*  TIBC 301  IRON 25*   Sepsis Labs: Recent Labs  Lab 04/28/18 0939 04/28/18 1752  WBC 5.9 6.0   Microbiology No results found for this or any previous visit (from the past 240 hour(s)).   Medications:   . amLODipine  5  mg Oral Daily  . atorvastatin  40 mg Oral q1800  . cholecalciferol  1,000 Units Oral Once per day on Mon Thu  . isosorbide mononitrate  60 mg Oral Daily  . latanoprost  1 drop Both Eyes QHS  . levothyroxine  12.5 mcg Oral QAC breakfast  . metoprolol succinate  100 mg Oral Daily  . Netarsudil Dimesylate  1 drop Both Eyes QHS  . pantoprazole  40 mg Oral BID  . vitamin B-12  1,000 mcg Oral Daily   Continuous Infusions: . ampicillin (OMNIPEN) IV 2 g (04/29/18 0700)  . cefTRIAXone (ROCEPHIN)  IV 2 g (04/29/18 8280)     LOS: 0 days   Charlynne Cousins  [facility]   *Please refer to [email or web address], password [email or web address] to get updated schedule on who will round on this patient, as hospitalists switch teams weekly. If 7PM-7AM, please contact night-coverage at [email or web address], password [email or web address] for any overnight needs.  04/29/2018, 7:43 AM

## 2018-04-29 NOTE — Anesthesia Preprocedure Evaluation (Addendum)
Anesthesia Evaluation  Patient identified by MRN, date of birth, ID band Patient awake    Reviewed: Allergy & Precautions, NPO status , Patient's Chart, lab work & pertinent test results  History of Anesthesia Complications Negative for: history of anesthetic complications  Airway Mallampati: II  TM Distance: >3 FB Neck ROM: Full    Dental  (+) Dental Advisory Given, Teeth Intact   Pulmonary asthma , sleep apnea ,   Amiodarone pulmonary toxicity    breath sounds clear to auscultation       Cardiovascular hypertension, Pt. on home beta blockers and Pt. on medications + CAD, + Cardiac Stents, + Peripheral Vascular Disease (s/p b/l CEA) and +CHF  + dysrhythmias Atrial Fibrillation + pacemaker  Rhythm:Regular Rate:Normal   S/p TAVR 2018  '19 TTE - EF 50% to 55%. Mild-mod AI. Mild MR.   '19 Coronary Angio -  Prox Cx lesion is 70% stenosed. Ost 1st Mrg lesion is 75% stenosed. Previously placed Mid RCA stent (unknown type) is widely patent. Prox RCA lesion is 40% stenosed. Post Atrio lesion is 75% stenosed. Ost LAD to Prox LAD lesion is 30% stenosed.    Neuro/Psych negative neurological ROS  negative psych ROS   GI/Hepatic Neg liver ROS, hiatal hernia, GERD  Medicated and Controlled,  Endo/Other  Hypothyroidism   Renal/GU CRFRenal disease    Prostate and bladder cancer     Musculoskeletal  (+) Arthritis ,   Abdominal   Peds  Hematology  (+) anemia ,   Anesthesia Other Findings Anaphylaxis to sulfa   Reproductive/Obstetrics                            Anesthesia Physical Anesthesia Plan  ASA: III  Anesthesia Plan: MAC   Post-op Pain Management:    Induction: Intravenous  PONV Risk Score and Plan: 1 and Propofol infusion and Treatment may vary due to age or medical condition  Airway Management Planned: Nasal Cannula and Natural Airway  Additional Equipment:  None  Intra-op Plan:   Post-operative Plan:   Informed Consent: I have reviewed the patients History and Physical, chart, labs and discussed the procedure including the risks, benefits and alternatives for the proposed anesthesia with the patient or authorized representative who has indicated his/her understanding and acceptance.     Plan Discussed with: CRNA and Anesthesiologist  Anesthesia Plan Comments:        Anesthesia Quick Evaluation

## 2018-04-29 NOTE — H&P (View-Only) (Signed)
Gastroenterology Inpatient Consultation   Attending Requesting Consult Charlynne Cousins, [facility] Day: 2  Reason for Consult Anemia in setting of +FOBT   History of Present Illness  Ryan [name] is a 84 y.o. male with a pmh significant for s/p TAVR with recurrent Endocarditis, CHF, A. fib (on Eliquis), PVD, Diverticulosis, HTN, HLD, hx of Prostate Cancer.  The GI service is consulted for evaluation and management of anemia in the setting of positive FOBT.  The patient states that over the course of the last week and 1/2 to 2 weeks that he had noted a slight change in the color of his stool.  His stool was brown and slightly darker brown than slightly darker brown then very dark brown and potentially near black.  However he has not had any true melanic stools and no overt liquid stools.  His bowel habits have not really changed in regards to the number or frequency of times he was going.  However with the course the last few weeks as well he has had an increasing dyspnea on exertion.  He has had issues of a chest burning that occurs at the time of early morning IV antibiotics being administered as well as evening antibiotics being administered.  Is not clear that this is been true angina versus heartburn but he does describe some issues of potentially mild transit dysphagia where he has to put his chin downwards to try and help allow food to pass.  Sometimes he feels like food may stick in the chest but that is not very frequent.  He has been on PPI therapy.  He came in for further evaluation after his blood counts were found to be low his primary care provider's office.  He has been taking Eliquis for his history of atrial fibrillation.  His last dose of Eliquis was yesterday in the morning.  The patient does not take any significant nonsteroidals or BC/Goody powders.  He has had a prior colonoscopy more than 10 to 15 years ago (per Dr. Loletha Carrow he has had a difficult colon with  significant diverticular disease because of adhesions in his abdomen from a surgery when he was a child).  He has no symptoms of diverticulitis or abdominal symptoms otherwise.  The patient is feeling much better after coming in and receiving 2 units of packed red blood cells with an appropriate response.  GI Review of Systems Positive as above very minimal amount of abdominal bloating Negative for odynophagia, early satiety, hematochezia   Problem List   Patient Active Problem List   Diagnosis Date Noted  . GIB (gastrointestinal bleeding) 04/28/2018  . HLD (hyperlipidemia) 04/28/2018  . Asthma 04/28/2018  . Hypothyroidism 04/28/2018  . Chronic diastolic CHF (congestive heart failure) ([place]) 04/28/2018  . Symptomatic anemia 04/28/2018  . Acute renal failure superimposed on stage 4 chronic kidney disease ([place]) 04/28/2018  . Medication monitoring encounter 04/22/2018  . Presence of permanent cardiac pacemaker 03/29/2018  . Bacteremia due to Enterococcus 03/29/2018  . Hypertensive urgency   . Unstable angina ([place]) 01/15/2018  . Claudication in peripheral vascular disease ([place]) 01/01/2018  . S/P AVR 12/02/2017  . 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 Intractable back pain 11/07/2017  . Elevated troponin 11/07/2017  . Urinary retention 11/07/2017  . Hypertension   . Syncope 10/23/2017  . Decreased diffusion capacity 10/23/2017  . 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 Elevated TSH 06/30/2017  . Anticoagulation management encounter 08/19/2016  . CAD (coronary artery  disease), native coronary artery 04/30/2016  . Tachy-brady syndrome ([place])   . Hypertensive heart disease with heart failure ([place])   . Pure hypercholesterolemia   . PAF (paroxysmal atrial fibrillation) ([place]) 03/18/2016  . Chronic combined systolic (congestive) and diastolic (congestive) heart failure ([place]) 03/18/2016  . Atrial fibrillation with rapid ventricular response ([place]) 03/18/2016  . DOE (dyspnea  on exertion) 01/17/2016  . Coronary artery disease of native artery of native heart with stable angina pectoris [facility])      Histories  Past Medical History Past Medical History:  Diagnosis Date  . Age-related macular degeneration   . Allergic rhinitis   . 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 Arthritis    "hands, lower back" (04/30/2016)  . Asthma   . Bacterial endocarditis   . Carotid artery obstruction    a. s/p bilat CEA.  . Chronic combined systolic (congestive) and diastolic (congestive) heart failure ([place]) 03/18/2016  . Chronic lower back pain   . Claudication in peripheral vascular disease ([place]) 01/01/2018   Claudication  . Coronary arteriosclerosis in native artery    a. 2017 Cardiac catheterization demonstrated worsening CAD with 70% mid LCx, 80% OM1 and 80% mid RCA stenosis. b.  In 11/17, he underwent successful rotational atherectomy and DES to RCA.  Marland Kitchen Decreased diffusion capacity 10/23/2017  . Diverticulitis of colon   . DJD (degenerative joint disease)   . Elevated TSH 06/30/2017  . GERD (gastroesophageal reflux disease)   . History of hiatal hernia   . HLD (hyperlipidemia)   . Hypertension   . Hypertensive heart disease with heart failure ([place])   . Intractable back pain 11/07/2017  . OSA (obstructive sleep apnea)    intolerant to CPAP  . PAF (paroxysmal atrial fibrillation) ([place])   . Paroxysmal supraventricular tachycardia ([place])   . Presence of permanent cardiac pacemaker   . Primary malignant neoplasm of bladder ([place])   . Prostate cancer ([place])   . 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 S/P AVR 12/02/2017  . Syncope 10/23/2017  . Tachy-brady syndrome ([place])   . Thrombocytopenia ([place]) 11/07/2017  . Urinary retention 11/07/2017   Past Surgical History:  Procedure Laterality Date  . APPENDECTOMY    . CARDIAC CATHETERIZATION N/A 01/17/2016   Procedure: Right/Left Heart Cath and Coronary Angiography;  Surgeon: Belva Crome, MD;   Location: [place] [facility];  Service: Cardiovascular;  Laterality: N/A;  . CARDIAC CATHETERIZATION N/A 04/30/2016   Procedure: Coronary/Graft Atherectomy;  Surgeon: Sherren Mocha, MD;  Location: [facility] [facility];  Service: Cardiovascular;  Laterality: N/A;  . CAROTID ENDARTERECTOMY Bilateral   . CATARACT EXTRACTION W/ INTRAOCULAR LENS  IMPLANT, BILATERAL Bilateral   . COLON SURGERY  1981   Meckles Diverticulum with volvulus  . CORONARY ANGIOGRAPHY N/A 01/18/2018   Procedure: CORONARY ANGIOGRAPHY;  Surgeon: Lorretta Harp, MD;  Location: [facility];  Service: Cardiovascular;  Laterality: N/A;  . EP IMPLANTABLE DEVICE N/A 03/21/2016   Procedure: Pacemaker Implant;  Surgeon: Evans Lance, MD;  Location: [facility];  Service: Cardiovascular;  Laterality: N/A;  . EYE SURGERY    . INGUINAL HERNIA REPAIR Right 2009  . INSERT / REPLACE / REMOVE PACEMAKER    . INSERTION PROSTATE RADIATION SEED    . TEE WITHOUT CARDIOVERSION N/A 06/10/2016   Procedure: TRANSESOPHAGEAL ECHOCARDIOGRAM (TEE);  Surgeon: Sherren Mocha, MD;  Location: [facility];  Service: Open Heart Surgery;  Laterality: N/A;  . TEE WITHOUT CARDIOVERSION N/A 11/10/2017  Procedure: TRANSESOPHAGEAL ECHOCARDIOGRAM (TEE);  Surgeon: Acie Fredrickson Wonda Cheng, MD;  Location: [facility] ENDOSCOPY;  Service: Cardiovascular;  Laterality: N/A;  . TEE WITHOUT CARDIOVERSION N/A 12/15/2017   Procedure: TRANSESOPHAGEAL ECHOCARDIOGRAM (TEE);  Surgeon: Buford Dresser, MD;  Location: [facility] ENDOSCOPY;  Service: Cardiovascular;  Laterality: N/A;  . TEE WITHOUT CARDIOVERSION N/A 03/30/2018   Procedure: TRANSESOPHAGEAL ECHOCARDIOGRAM (TEE);  Surgeon: Jerline Pain, MD;  Location: [facility] ENDOSCOPY;  Service: Cardiovascular;  Laterality: N/A;  . TONSILLECTOMY  ~ 1947  . TRANSCATHETER AORTIC VALVE REPLACEMENT, TRANSFEMORAL N/A 06/10/2016   Procedure: TRANSCATHETER AORTIC VALVE REPLACEMENT, TRANSFEMORAL;  Surgeon: Sherren Mocha, MD;  Location: [facility];  Service:  Open Heart Surgery;  Laterality: N/A;    Allergies Allergies  Allergen Reactions  . Ciprofloxacin Rash  . Hydrochlorothiazide Rash  . Sulfa Antibiotics Anaphylaxis  . Ace Inhibitors Cough  . Losartan Cough  . Carvedilol Cough    Pt states it "causes him too cough."  . Lisinopril Cough    Pt reports worsening cough and "feeling wheezy."  . Soy Allergy Nausea And Vomiting   Family History Family History  Problem Relation Age of Onset  . Heart disease Mother   . Heart disease Father   The patient's FH is negative for IBD/GI Malignancies.  Social History Social History   Socioeconomic History  . Marital status: Married    Spouse name: Not on file  . Number of children: Not on file  . Years of education: Not on file  . Highest education level: Not on file  Occupational History  . Occupation: retired  Scientific laboratory technician  . Financial resource strain: Not on file  . Food insecurity:    Worry: Not on file    Inability: Not on file  . Transportation needs:    Medical: Not on file    Non-medical: Not on file  Tobacco Use  . Smoking status: Never Smoker  . Smokeless tobacco: Never Used  Substance and Sexual Activity  . Alcohol use: No    Alcohol/week: 0.0 standard drinks    Comment: 04/30/2016 "nothing in years"  . Drug use: No  . Sexual activity: Never  Lifestyle  . Physical activity:    Days per week: Not on file    Minutes per session: Not on file  . Stress: Not on file  Relationships  . Social connections:    Talks on phone: Not on file    Gets together: Not on file    Attends religious service: Not on file    Active member of club or organization: Not on file    Attends meetings of clubs or organizations: Not on file    Relationship status: Not on file  . Intimate partner violence:    Fear of current or ex partner: Not on file    Emotionally abused: Not on file    Physically abused: Not on file    Forced sexual activity: Not on file  Other Topics Concern  .  Not on file  Social History Narrative   Retired    Three children - One in [place], One in [place], and one in [place].        Medications  Home Medications No current facility-administered medications on file prior to encounter.    Current Outpatient Medications on File Prior to Encounter  Medication Sig Dispense Refill  . acetaminophen-codeine (TYLENOL #3) 300-30 MG tablet Take 0.5-1 tablets by mouth every 4 (four) hours as needed for moderate pain. 15 tablet 0  . amLODipine (NORVASC)  5 MG tablet Take 1 tablet (5 mg total) by mouth daily. 30 tablet 3  . ampicillin IVPB Inject 2 g into the vein every 8 (eight) hours. As a continuous infusion. Indication: PVE/PPM infection Last Day of Therapy:  May 10, 2018 Labs - Once weekly:  CBC/D and BMP, Labs - Every other week:  ESR and CRP 117 Units 0  . apixaban (ELIQUIS) 2.5 MG TABS tablet Take 1 tablet (2.5 mg total) by mouth 2 (two) times daily. 180 tablet 3  . atorvastatin (LIPITOR) 40 MG tablet Take 1 tablet (40 mg total) by mouth daily at 6 PM. 90 tablet 3  . cefTRIAXone (ROCEPHIN) IVPB Inject 2 g into the vein every 12 (twelve) hours. Indication:  PVE/PPM infection Last Day of Therapy:  May 10, 2018 Labs - Once weekly:  CBC/D and BMP, Labs - Every other week:  ESR and CRP 78 Units 0  . cholecalciferol (VITAMIN D) 1000 units tablet Take 1,000 Units by mouth 2 (two) times a week.     . furosemide (LASIX) 40 MG tablet Take 1 tablet (40 mg total) by mouth daily. (Patient taking differently: Take 40 mg by mouth daily as needed for fluid. ) 90 tablet 2  . isosorbide mononitrate (IMDUR) 60 MG 24 hr tablet Take 1 tablet (60 mg total) by mouth daily. 30 tablet 3  . latanoprost (XALATAN) 0.005 % ophthalmic solution Place 1 drop into both eyes at bedtime.    Marland Kitchen levothyroxine (SYNTHROID, LEVOTHROID) 25 MCG tablet Take 0.5 tablets (12.5 mcg total) by mouth daily before breakfast. 45 tablet 3  . metoprolol succinate (TOPROL-XL) 100 MG 24 hr tablet  Take 1 tablet (100 mg total) by mouth daily. Take with or immediately following a meal. 90 tablet 3  . Netarsudil Dimesylate (RHOPRESSA) 0.02 % SOLN Place 1 drop into both eyes at bedtime.    . nitroGLYCERIN (NITROSTAT) 0.4 MG SL tablet Place 1 tablet (0.4 mg total) under the tongue every 5 (five) minutes x 3 doses as needed for chest pain. 25 tablet 3  . omeprazole (PRILOSEC) 20 MG capsule Take 20 mg by mouth daily.    . polyethylene glycol (MIRALAX / GLYCOLAX) packet Take 17 g by mouth daily. (Patient taking differently: Take 17 g by mouth daily as needed for moderate constipation. ) 14 each 0  . vitamin B-12 (CYANOCOBALAMIN) 1000 MCG tablet Take 1,000 mcg by mouth daily.    Derrill Memo ON 05/12/2018] amoxicillin (AMOXIL) 500 MG tablet Take 1 tablet (500 mg total) by mouth 2 (two) times daily. 60 tablet 11   Scheduled Inpatient Medications . amLODipine  5 mg Oral Daily  . atorvastatin  40 mg Oral q1800  . cholecalciferol  1,000 Units Oral Once per day on Mon Thu  . isosorbide mononitrate  60 mg Oral Daily  . latanoprost  1 drop Both Eyes QHS  . levothyroxine  12.5 mcg Oral QAC breakfast  . metoprolol succinate  100 mg Oral Daily  . Netarsudil Dimesylate  1 drop Both Eyes QHS  . pantoprazole  40 mg Oral BID  . vitamin B-12  1,000 mcg Oral Daily   Continuous Inpatient Infusions . ampicillin (OMNIPEN) IV Stopped (04/29/18 0720)  . cefTRIAXone (ROCEPHIN)  IV 2 g (04/29/18 1245)   PRN Inpatient Medications acetaminophen **OR** acetaminophen, acetaminophen-codeine, albuterol, hydrALAZINE, nitroGLYCERIN, ondansetron **OR** ondansetron (ZOFRAN) IV, polyethylene glycol, sodium chloride flush, zolpidem   Review of Systems  General: Positive for fatigue and weight loss; denies current fevers or chills HEENT:  Denies oral lesions Cardiovascular: Denies chest pain/palpitations Pulmonary: Shortness of breath at baseline Gastroenterological: See HPI Genitourinary: Denies darkened  urine Hematological: Positive for easy bruising/bleeding Psychological: Mood is anxious about being in the hospital again but hopeful to establish a diagnosis   Physical Examination  BP (!) 127/58   Pulse 66   Temp 98.2 F (36.8 C) (Oral)   Resp 17   Ht _0  (1.676 m)   Wt 61.3 kg   SpO2 95%   BMI 21.81 kg/m  GEN: Elderly, appears stated age, accompanied at bedside by wife PSYCH: Cooperative, without pressured speech EYE: Conjunctivae pale pink, sclera anicteric ENT: MMM, without oral ulcers, no erythema or exudates noted NECK: Supple  CV: Without rubs or gallops, systolic murmur appreciated RESP: CTAB posteriorly, without wheezing GI: NABS, soft, rounded, NT/ND, without rebound or guarding GU: DRE not performed MSK/EXT: Trace bilateral lower extremity edema SKIN: No jaundice NEURO:  Alert & Oriented x 3, no focal deficits   Review of Data  I reviewed the following data at the time of this encounter:  Laboratory Studies   Recent Labs  Lab 04/29/18 0805  NA 141  K 4.0  CL 105  CO2 25  BUN 44*  CREATININE 2.93*  GLUCOSE 94  CALCIUM 8.8*   Recent Labs  Lab 04/28/18 1752  AST 21  ALT 11  ALKPHOS 54    Recent Labs  Lab 04/28/18 0939 04/28/18 1752 04/29/18 0805  WBC 5.9 6.0 6.3  HGB 7.8 Repeated and verified X2.* 7.9* 10.3*  HCT 23.5 Repeated and verified X2.* 25.5* 31.8*  PLT 191.0 199 171   Recent Labs  Lab 04/28/18 2013 04/29/18 1003  APTT  --  31  INR 1.27  --    Imaging Studies  June 2019 CT abdomen without contrast IMPRESSION: 1. Moderate stool throughout the colon. There is a relative transition point in the sigmoid without obstruction. This could be due to adhesions. 2. No other acute or focal lesion to explain abdominal distention or leukocytosis. 3. Right greater than left basilar airspace disease likely reflects atelectasis. Infection is not excluded on the right. 4. Aortic Atherosclerosis (ICD10-I70.0). No significant aneurysm is  present. 5. Coronary artery disease. 6. TAVR 7. Mild inflammatory changes about the kidneys and in the mesentery likely reflects mild edema.  GI Procedures and Studies  No relevant studies to review  Assessment  Mr. Dufrane is a 84 y.o. male with a pmh significant for s/p TAVR with recurrent Endocarditis, CHF, A. fib (on Eliquis), PVD, Diverticulosis, HTN, HLD, hx of Prostate Cancer.  The GI service is consulted for evaluation and management of anemia in the setting of positive FOBT.  The patient is hemodynamically stable.  At this point in time the etiology of his occult blood loss is not completely clear.  The changes in bowel habits over the course the last couple weeks would suggest potentially an upper source of GI bleeding however as we know sometimes lower sources of GI bleeding can be causes of occult anemia as well.  His Eliquis has been stopped as of today but last dose was yesterday.  Due to his hemodynamic stability and appropriate response to 2 units of packed RBCs I think it is reasonable for the patient to be monitored and allow Eliquis to washout completely.  We will plan for an EGD/SBE to be performed tomorrow and evaluation of possible source of occult GI blood loss.  We will maintain him on twice daily PPI.  Even though  his aortic valve has been replaced I think he still may be at risk of angiectasia is in the small bowel so an enteroscopy would be ideal if an upper does not show a source.  If the upper evaluation is negative we will plan a colonoscopy and further evaluation including possible capsule endoscopy based on findings and discussions with family thereafter.  We briefly discussed considering doing an endoscopy and colonoscopy at the same time but due to the patient's multiple medical comorbidities and reported difficult colonoscopy and his overall health at this point in time I would like to start and began with just an upper evaluation if possible to find his source.  The  risks and benefits of endoscopic evaluation were discussed with the patient; these include but are not limited to the risk of perforation, infection, bleeding, missed lesions, lack of diagnosis, severe illness requiring hospitalization, as well as anesthesia and sedation related illnesses.  The patient is agreeable to proceed.  All patient questions were answered, to the best of my ability, and the patient agrees to the aforementioned plan of action with follow-up as indicated.   Plan/Recommendations  Please maintain NPO Status _0  Plan for tentative EGD on 11/29 Please obtain early AM CBC, INR Hold Heparin/VTE PPx Continue PPI BID   Discharge Planning Diet: NPO @ midnight Anticoagulation and antiplatelets:Holding eliquis Discharge Medications: N/A Follow up: N/A  Procedure Procedures planned and timing of procedure: EGD/SBE tomorrow Lab work ordered: Per [facility] Hold the following anticoagulation and/or antiplatelets for procedure? Holding currently   Thank you for this consult.  We will continue to follow.  Please page/call with questions or concerns.   Justice Britain, MD [facility] Office # [phone]

## 2018-04-29 NOTE — Progress Notes (Signed)
   04/28/18 2215  Vitals  Temp 97.8 F (36.6 C)  Temp Source Oral  BP (!) 144/64  MAP (mmHg) 86  BP Location Right Arm  BP Method Automatic  Patient Position (if appropriate) Sitting  Pulse Rate 64  Resp 20  Oxygen Therapy  SpO2 96 %  O2 Device Room Air  Height and Weight  Height 5\' 6"  (1.676 m)  Weight 61.5 kg (Scale C)  Type of Scale Used Standing  Type of Weight Actual  BSA (Calculated - sq m) 1.69 sq meters  BMI (Calculated) 21.9  Weight in (lb) to have BMI = 25 154.6  Admitted pt to rm 3E16 from ED, pt alert and oriented, denied pain at this time, oriented to room, call bell placed within reach, placed on cardiac monitor, CCMD made aware.

## 2018-04-30 ENCOUNTER — Encounter (HOSPITAL_COMMUNITY)
Admission: EM | Disposition: A | Discharge status: Home / Self Care | Payer: Self-pay | Source: Home / Self Care | Attending: Emergency Medicine

## 2018-04-30 ENCOUNTER — Observation Stay (HOSPITAL_COMMUNITY): Payer: Medicare Other | Admitting: Anesthesiology

## 2018-04-30 ENCOUNTER — Encounter (HOSPITAL_COMMUNITY): Payer: Self-pay | Admitting: *Deleted

## 2018-04-30 DIAGNOSIS — K3189 Other diseases of stomach and duodenum: Secondary | ICD-10-CM

## 2018-04-30 DIAGNOSIS — K6389 Other specified diseases of intestine: Secondary | ICD-10-CM | POA: Diagnosis not present

## 2018-04-30 DIAGNOSIS — D509 Iron deficiency anemia, unspecified: Secondary | ICD-10-CM | POA: Diagnosis not present

## 2018-04-30 DIAGNOSIS — K449 Diaphragmatic hernia without obstruction or gangrene: Secondary | ICD-10-CM | POA: Diagnosis not present

## 2018-04-30 DIAGNOSIS — R7881 Bacteremia: Secondary | ICD-10-CM | POA: Diagnosis not present

## 2018-04-30 DIAGNOSIS — E039 Hypothyroidism, unspecified: Secondary | ICD-10-CM | POA: Diagnosis not present

## 2018-04-30 DIAGNOSIS — N179 Acute kidney failure, unspecified: Secondary | ICD-10-CM | POA: Diagnosis not present

## 2018-04-30 DIAGNOSIS — R195 Other fecal abnormalities: Secondary | ICD-10-CM | POA: Diagnosis not present

## 2018-04-30 DIAGNOSIS — Q399 Congenital malformation of esophagus, unspecified: Secondary | ICD-10-CM | POA: Diagnosis not present

## 2018-04-30 DIAGNOSIS — K922 Gastrointestinal hemorrhage, unspecified: Secondary | ICD-10-CM | POA: Diagnosis not present

## 2018-04-30 DIAGNOSIS — D508 Other iron deficiency anemias: Secondary | ICD-10-CM | POA: Diagnosis not present

## 2018-04-30 DIAGNOSIS — I5032 Chronic diastolic (congestive) heart failure: Secondary | ICD-10-CM | POA: Diagnosis not present

## 2018-04-30 HISTORY — PX: ENTEROSCOPY: SHX5533

## 2018-04-30 LAB — TYPE AND SCREEN
ABO/RH(D): O POS
Antibody Screen: NEGATIVE
Unit division: 0
Unit division: 0

## 2018-04-30 LAB — CBC
HCT: 30 % — ABNORMAL LOW (ref 39.0–52.0)
HEMATOCRIT: 31.1 % — AB (ref 39.0–52.0)
Hemoglobin: 9.6 g/dL — ABNORMAL LOW (ref 13.0–17.0)
Hemoglobin: 9.8 g/dL — ABNORMAL LOW (ref 13.0–17.0)
MCH: 28.7 pg (ref 26.0–34.0)
MCH: 28.5 pg (ref 26.0–34.0)
MCHC: 32 g/dL (ref 30.0–36.0)
MCHC: 31.5 g/dL (ref 30.0–36.0)
MCV: 89.6 fL (ref 80.0–100.0)
MCV: 90.4 fL (ref 80.0–100.0)
NRBC: 0 % (ref 0.0–0.2)
Platelets: 169 10*3/uL (ref 150–400)
Platelets: 174 10*3/uL (ref 150–400)
RBC: 3.35 MIL/uL — ABNORMAL LOW (ref 4.22–5.81)
RBC: 3.44 MIL/uL — ABNORMAL LOW (ref 4.22–5.81)
RDW: 15.5 % (ref 11.5–15.5)
RDW: 15.2 % (ref 11.5–15.5)
WBC: 6.2 10*3/uL (ref 4.0–10.5)
WBC: 14.7 10*3/uL — AB (ref 4.0–10.5)
nRBC: 0 % (ref 0.0–0.2)

## 2018-04-30 LAB — BPAM RBC
Blood Product Expiration Date: 201912252359
Blood Product Expiration Date: 201912262359
ISSUE DATE / TIME: 201911272255
ISSUE DATE / TIME: 201911280305
UNIT TYPE AND RH: 5100
Unit Type and Rh: 5100

## 2018-04-30 SURGERY — ENTEROSCOPY
Anesthesia: Monitor Anesthesia Care

## 2018-04-30 MED ORDER — SODIUM CHLORIDE 0.9 % IV SOLN
510.0000 mg | Freq: Once | INTRAVENOUS | Status: AC
  Administered 2018-04-30: 510 mg via INTRAVENOUS
  Filled 2018-04-30: qty 17

## 2018-04-30 MED ORDER — PEG-KCL-NACL-NASULF-NA ASC-C 100 G PO SOLR
1.0000 | Freq: Once | ORAL | Status: AC
  Administered 2018-04-30: 200 g via ORAL
  Filled 2018-04-30: qty 1

## 2018-04-30 MED ORDER — PROPOFOL 1000 MG/100ML IV EMUL
INTRAVENOUS | Status: AC
  Filled 2018-04-30: qty 100

## 2018-04-30 MED ORDER — BISACODYL 5 MG PO TBEC
20.0000 mg | DELAYED_RELEASE_TABLET | Freq: Once | ORAL | Status: AC
  Administered 2018-04-30: 20 mg via ORAL
  Filled 2018-04-30: qty 4

## 2018-04-30 MED ORDER — METOCLOPRAMIDE HCL 5 MG/ML IJ SOLN: 5.0000 mg | Freq: Once | INTRAMUSCULAR | Status: DC

## 2018-04-30 MED ORDER — ISOSORBIDE MONONITRATE ER 30 MG PO TB24
30.0000 mg | ORAL_TABLET | Freq: Every day | ORAL | Status: DC
  Administered 2018-05-02: 30 mg via ORAL
  Filled 2018-04-30: qty 1

## 2018-04-30 MED ORDER — SPOT INK MARKER SYRINGE KIT
PACK | SUBMUCOSAL | Status: AC
  Filled 2018-04-30: qty 5

## 2018-04-30 MED ORDER — PROPOFOL 500 MG/50ML IV EMUL
INTRAVENOUS | Status: DC | PRN
  Administered 2018-04-30: 50 ug/kg/min via INTRAVENOUS

## 2018-04-30 MED ORDER — SODIUM CHLORIDE 0.9 % IV SOLN
INTRAVENOUS | Status: DC
  Administered 2018-04-30 (×2): via INTRAVENOUS

## 2018-04-30 MED ORDER — SODIUM CHLORIDE 0.9 % IV SOLN
INTRAVENOUS | Status: AC
  Administered 2018-04-30: 12:00:00 via INTRAVENOUS

## 2018-04-30 MED ORDER — SPOT INK MARKER SYRINGE KIT
PACK | SUBMUCOSAL | Status: DC | PRN
  Administered 2018-04-30: 2 mL via SUBMUCOSAL

## 2018-04-30 MED ORDER — PROPOFOL 10 MG/ML IV BOLUS
INTRAVENOUS | Status: DC | PRN
  Administered 2018-04-30 (×5): 20 mg via INTRAVENOUS
  Administered 2018-04-30: 40 mg via INTRAVENOUS
  Administered 2018-04-30: 20 mg via INTRAVENOUS

## 2018-04-30 MED ORDER — PHENYLEPHRINE HCL 10 MG/ML IJ SOLN
INTRAMUSCULAR | Status: DC | PRN
  Administered 2018-04-30 (×3): 80 ug via INTRAVENOUS
  Administered 2018-04-30: 40 ug via INTRAVENOUS
  Administered 2018-04-30 (×2): 80 ug via INTRAVENOUS

## 2018-04-30 NOTE — Op Note (Signed)
[facility] Patient Name: Ryan [name] Procedure Date : 04/30/2018 MRN: [id number] Attending MD: Justice Britain , MD Date of Birth: [birth year] CSN: [id number] Age: 84 Admit Type: Inpatient Procedure:                Small bowel enteroscopy Indications:              Iron deficiency anemia, Occult blood in stool Providers:                Justice Britain, MD, Carmie End, RN, Elspeth Cho Tech., Technician, Derinda Sis, CRNA Referring MD:             Dr. Broadus John ([facility]) Medicines:                Monitored Anesthesia Care Complications:            No immediate complications. Estimated Blood Loss:     Estimated blood loss was minimal. Procedure:                Pre-Anesthesia Assessment:                           - Prior to the procedure, a History and Physical                            was performed, and patient medications and                            allergies were reviewed. The patient's tolerance of                            previous anesthesia was also reviewed. The risks                            and benefits of the procedure and the sedation                            options and risks were discussed with the patient.                            All questions were answered, and informed consent                            was obtained. Prior Anticoagulants: The patient has                            taken Eliquis (apixaban), last dose was 2 days                            prior to procedure. ASA Grade Assessment: III - A                            patient with severe systemic disease. After  reviewing the risks and benefits, the patient was                            deemed in satisfactory condition to undergo the                            procedure.                           After obtaining informed consent, the endoscope was                            passed under direct vision. Throughout  the                            procedure, the patient's blood pressure, pulse, and                            oxygen saturations were monitored continuously. The                            GIF-H190 ([id number]) Olympus adult EGD was introduced                            through the mouth and advanced to the second part                            of duodenum. The PCF-H190DL ([id number]) peds colon                            was introduced through the mouth and advanced to                            the proximal jejunum. The small bowel enteroscopy                            was accomplished without difficulty. The patient                            tolerated the procedure. Scope In: Scope Out: Findings:      No gross lesions were noted in the proximal esophagus and in the mid       esophagus.      The distal esophagus was mildly tortuous.      A 3 cm hiatal hernia was found. The proximal extent of the gastric folds       (end of tubular esophagus) was 39.5 cm from the incisors. The hiatal       narrowing was 42 cm from the incisors. The Z-line was 39 cm from the       incisors. There was no evidence of Cameron's lesions.      The gastroesophageal flap valve was visualized endoscopically and       classified as Hill Grade IV (no fold, wide open lumen, hiatal hernia       present).      No gross lesions were noted  in the entire examined stomach.      Normal appearing mucosa throughout the examined duodenum. However, the       mucosa is friable, such that deep pressure on certain areas led to mild       irritation of the mucosa.      Normal appearing mucosa throughout the examined proximal jejunum.       However, the mucosa is friable, such that deep pressure with the       endoscope led to mild irritation. Area was tattooed with an injection of       Spot (carbon black) to demarcate the distal extent of the push       enteroscope. Impression:               - No gross lesions in proximal/middle  esophagus.                            Slightly tortuous distal esophagus.                           - 3 cm hiatal hernia. Gastroesophageal flap valve                            classified as Hill Grade IV (no fold, wide open                            lumen, hiatal hernia present). No Cameron's lesions                            noted.                           - No gross lesions in the stomach.                           - Normal appearing duodenal mucosa that had some                            mild friability.                           - Normal appearing proximal jejunum mucosa that had                            some mild friability. Tattooed distal extent of                            push enteroscopy. Recommendation:           - The patient will be observed post-procedure,                            until all discharge criteria are met.                           - Return patient to hospital ward for ongoing care.                           -  Clear liquid diet.                           - Colonoscopy to be scheduled for tomorrow.                           - Preparation to begin later today (this has been                            ordered).                           - No source for occult blood loss anemia noted on                            this examination, but with the friability of the                            mucosa he could have intermittent oozing while on                            anticoagulation that should be considered a                            possibility.                           - He will receive IV Iron later today (Rx already                            ordered).                           - The findings and recommendations were discussed                            with the patient.                           - The findings and recommendations were discussed                            with the patient's family.                           - The findings and  recommendations were discussed                            with the referring physician. Procedure Code(s):        --- Professional ---                           [id number], Small intestinal endoscopy, enteroscopy                            beyond second portion of duodenum, not including  ileum; diagnostic, including collection of                            specimen(s) by brushing or washing, when performed                            (separate procedure)                           44799, Unlisted procedure, small intestine Diagnosis Code(s):        --- Professional ---                           Q39.9, Congenital malformation of esophagus,                            unspecified                           K44.9, Diaphragmatic hernia without obstruction or                            gangrene                           K31.89, Other diseases of stomach and duodenum                           K63.89, Other specified diseases of intestine                           D50.9, Iron deficiency anemia, unspecified                           R19.5, Other fecal abnormalities CPT copyright 2018 [employer]. All rights reserved. The codes documented in this report are preliminary and upon coder review may  be revised to meet current compliance requirements. Justice Britain, MD 04/30/2018 10:34:51 AM Number of Addenda: 0

## 2018-04-30 NOTE — Progress Notes (Signed)
First part of prep almost finished. Will start with clear liquids per order. Already had 4 BM, still brown per pt and wife.

## 2018-04-30 NOTE — Anesthesia Postprocedure Evaluation (Signed)
Anesthesia Post Note  Patient: Ryan [name]  Procedure(s) Performed: ENTEROSCOPY (N/A ) SUBMUCOSAL TATTOO INJECTION     Patient location during evaluation: PACU Anesthesia Type: MAC Level of consciousness: awake and alert Pain management: pain level controlled Vital Signs Assessment: post-procedure vital signs reviewed and stable Respiratory status: spontaneous breathing, nonlabored ventilation and respiratory function stable Cardiovascular status: stable and blood pressure returned to baseline Anesthetic complications: no    Last Vitals:  Vitals:   04/30/18 1040 04/30/18 1108  BP: (!) 109/52 (!) 154/74  Pulse: 61 77  Resp: 15 20  Temp:  (!) 36.4 C  SpO2: 99% 98%    Last Pain:  Vitals:   04/30/18 1108  TempSrc: Oral  PainSc: 0-No pain                 Audry Pili

## 2018-04-30 NOTE — Plan of Care (Signed)

## 2018-04-30 NOTE — Progress Notes (Signed)
Paged MD about expired cardiac order

## 2018-04-30 NOTE — Transfer of Care (Signed)
Immediate Anesthesia Transfer of Care Note  Patient: Ryan [name]  Procedure(s) Performed: ENTEROSCOPY (N/A ) ESOPHAGOGASTRODUODENOSCOPY (EGD) (N/A ) SUBMUCOSAL TATTOO INJECTION  Patient Location: Endoscopy Unit  Anesthesia Type:MAC  Level of Consciousness: awake, alert  and oriented, talking with family at bedside  Airway & Oxygen Therapy: Patient Spontanous Breathing and Patient connected to nasal cannula oxygen  Post-op Assessment: Report given to RN and Post -op Vital signs reviewed and stable  Post vital signs: Reviewed and stable  Last Vitals:  Vitals Value Taken Time  BP 105/46 04/30/2018 10:28 AM  Temp    Pulse 63 04/30/2018 10:31 AM  Resp 18 04/30/2018 10:31 AM  SpO2 96 % 04/30/2018 10:31 AM  Vitals shown include unvalidated device data.  Last Pain:  Vitals:   04/30/18 1028  TempSrc: Oral  PainSc: 0-No pain         Complications: No apparent anesthesia complications

## 2018-04-30 NOTE — Anesthesia Procedure Notes (Signed)
Procedure Name: MAC Date/Time: 04/30/2018 9:42 AM Performed by: Jearld Pies, CRNA Pre-anesthesia Checklist: Patient identified, Emergency Drugs available, Suction available, Patient being monitored and Timeout performed Oxygen Delivery Method: Simple face mask Preoxygenation: Pre-oxygenation with 100% oxygen Induction Type: IV induction

## 2018-04-30 NOTE — Progress Notes (Addendum)
[facility] PROGRESS NOTE    Progress Note  Ryan [name]  MRN:[id number] DOB: 03/16/1930 DOA: 04/28/2018 PCP: Nafziger, Cory, NP     Brief Narrative:   Ryan [name] is an 84 y.o. 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 via PICC line , history of TAVR who presented with generalized weakness dark stools and shortness of breath  Assessment/Plan:   Iron deficiency anemia with melena /heme positive stools  -Baseline hemoglobin around 10, on admission hemoglobin was 7.9 with symptoms,  -also has chronic anemia likely from CKD4 -Eliquis on hold -History of colonoscopy remotely with diverticulosis -Appreciate gastroenterology consultation, plan for endoscopy this morning -Currently on oral Protonix, will need to consider colonoscopy if EGD is unrevealing -Hemoglobin is 9.6 this morning, monitor every 12h  -IV Iron x1 today  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 -Currently in sinus rhythm, heart rate controlled, continue metoprolol -Eliquis on hold  Acute kidney injury on CKD stage IV -Baseline creatinine ranges from 2.3-2.8 -Creatinine slightly higher at 3.4 today likely in the setting of blood loss -Diuretics on hold, gentle saline for 12 hours -Monitor bmet closely, renal dosing of medications  Hyperlipidemia: Continue Lipitor.  History of  CAD: -stable, continue Lipitor,  Imdur-cut down dose  Asthma: Continue inhalers.  Hypothyroidism: Continue Synthroid  Chronic diastolic heart failure: Last 2D echo on 03/30/2018 EF showed 55% with aortic valve regurgitation. -Appears slightly dry on exam -Holding diuretics today -Gentle IV fluids for 12 hours  H/o permanent cardiac pacemaker   DVT prophylaxis: SCD Family Communication:wife at bedside Disposition Plan/Barrier to D/C: Home pending workup  Code Status:     Code Status Orders  (From admission, onward)         Start     Ordered   04/28/18 2047  Full code  Continuous     04/28/18 2047        Code Status History    Date Active Date Inactive Code Status Order ID Comments User Context   03/29/2018 1300 04/02/2018 1704 Full Code [id number]  Yates, Jennifer, MD ED   01/15/2018 1540 01/19/2018 1724 Full Code [id number]  Kroeger, Krista M., PA-C Inpatient   11/07/2017 1201 11/14/2017 1709 Full Code [id number]  Black, Karen M, NP ED   10/23/2017 1758 10/24/2017 2309 Full Code [id number]  Patel, Pranav M, MD ED   06/10/2016 1003 06/12/2016 1605 Full Code [id number]  Cooper, Michael, MD Inpatient   04/30/2016 1328 05/01/2016 1512 Full Code [id number]  Cooper, Michael, MD Inpatient   03/18/2016 1506 03/22/2016 1615 Full Code [id number]  Thompson, Kathryn R, PA-C Inpatient   03/18/2016 1205 03/18/2016 1506 Full Code [id number]  Smith, Henry W, MD ED   01/17/2016 1003 01/17/2016 1649 Full Code [id number]  Smith, Henry W, MD Inpatient        IV Access:    Peripheral IV   Procedures and diagnostic studies:   Dg Chest 2 View  Result Date: 04/28/2018 CLINICAL DATA:  Dyspnea, chest pain, anemia EXAM: CHEST - 2 VIEW   COMPARISON:  03/28/2018 chest radiograph. FINDINGS: Stable configuration of 2 lead left subclavian pacemaker and aortic valve prosthesis. Right PICC terminates in middle third of the SVC. Stable cardiomediastinal silhouette with top-normal heart size. No pneumothorax. No  pleural effusion. Lungs appear clear, with no acute consolidative airspace disease and no pulmonary edema. IMPRESSION: No active cardiopulmonary disease. Electronically Signed   By: Jason A Poff M.D.   On: 04/28/2018 18:55     Medical Consultants:    None.  Anti-Infectives:   IV ampicillin and Rocephin.  Subjective:    Ryan [name] he feels great he relates some burning retrosternally  Objective:    Vitals:   04/30/18 0842 04/30/18 1028 04/30/18 1035 04/30/18 1040  BP: (!) 189/64 (!) 105/46 (!) 111/44 (!) 109/52  Pulse: 62 61 61 61  Resp: 12  13 15  Temp: 97.9 F (36.6 C)  97.8 F (36.6 C)   TempSrc: Oral Oral Oral   SpO2: 96% 96% 95% 99%  Weight:      Height:        Intake/Output Summary (Last 24 hours) at 04/30/2018 1101 Last data filed at 04/30/2018 1002 Gross per 24 hour  Intake 750 ml  Output 900 ml  Net -150 ml   Filed Weights   04/28/18 2215 04/29/18 0538 04/30/18 0622  Weight: 61.5 kg 61.3 kg 60.1 kg    Exam: Gen: Awake, Alert, Oriented X 3, no distress HEENT: Pupils equal reactive, oral mucosa moist Lungs: Good air movement, clear bilaterally CVS: S1-S2/regular rate rhythm Abd: soft, Non tender, non distended, BS present Extremities: No edema Neuro; moves all extremities, no localizing signs  Psychiatry: Mood & affect appropriate.    Data Reviewed:    Labs: Basic Metabolic Panel: Recent Labs  Lab 04/28/18 1752 04/29/18 0805  NA 139 141  K 4.3 4.0  CL 103 105  CO2 27 25  GLUCOSE 109* 94  BUN 48* 44*  CREATININE 3.21* 2.93*  CALCIUM 9.1 8.8*   GFR Estimated Creatinine Clearance: 15.7 mL/min (A) (by C-G formula based on SCr of 2.93 mg/dL (H)). Liver Function Tests: Recent Labs  Lab 04/28/18 1752  AST 21  ALT 11  ALKPHOS 54  BILITOT 0.3  PROT 6.2*  ALBUMIN 3.2*   No results for input(s): LIPASE, AMYLASE in the last 168 hours. No results for input(s): AMMONIA in the last 168 hours. Coagulation profile Recent Labs    Lab 04/28/18 2013 04/29/18 1003  INR 1.27 1.17    CBC: Recent Labs  Lab 04/28/18 0939 04/28/18 1752 04/29/18 0805 04/29/18 2034 04/30/18 0336  WBC 5.9 6.0 6.3 6.2 6.2  NEUTROABS 3.9  --   --   --   --   HGB 7.8 Repeated and verified X2.* 7.9* 10.3* 10.4* 9.6*  HCT 23.5 Repeated and verified X2.* 25.5* 31.8* 32.2* 30.0*  MCV 91.4 94.8 89.1 89.4 89.6  PLT 191.0 199 171 181 169   Cardiac Enzymes: No results for input(s): CKTOTAL, CKMB, CKMBINDEX, TROPONINI in the last 168 hours. BNP (last 3 results) Recent Labs    10/20/17 1058 01/14/18 1309  PROBNP 146.0* 3,168*   CBG: No results for input(s): GLUCAP in the last 168 hours. D-Dimer: No results for input(s): DDIMER in the last 72 hours. Hgb A1c: No results for input(s): HGBA1C in the last 72 hours. Lipid Profile: No results for input(s): CHOL, HDL, LDLCALC, TRIG, CHOLHDL, LDLDIRECT in the last 72 hours. Thyroid function studies: No results for input(s): TSH, T4TOTAL, T3FREE, THYROIDAB in the last   72 hours.  Invalid input(s): FREET3 Anemia work up: Recent Labs    04/28/18 0939  FERRITIN 17*  TIBC 301  IRON 25*   Sepsis Labs: Recent Labs  Lab 04/28/18 1752 04/29/18 0805 04/29/18 2034 04/30/18 0336  WBC 6.0 6.3 6.2 6.2   Microbiology No results found for this or any previous visit (from the past 240 hour(s)).   Medications:   . amLODipine  5 mg Oral Daily  . atorvastatin  40 mg Oral q1800  . bisacodyl  20 mg Oral Once  . cholecalciferol  1,000 Units Oral Once per day on Mon Thu  . isosorbide mononitrate  60 mg Oral Daily  . latanoprost  1 drop Both Eyes QHS  . levothyroxine  12.5 mcg Oral QAC breakfast  . metoCLOPramide (REGLAN) injection  5 mg Intravenous Once   Followed by  . [START ON 05/01/2018] metoCLOPramide (REGLAN) injection  5 mg Intravenous Once  . metoprolol succinate  100 mg Oral Daily  . Netarsudil Dimesylate  1 drop Both Eyes QHS  . pantoprazole  40 mg Oral BID  . peg 3350 powder   1 kit Oral Once  . vitamin B-12  1,000 mcg Oral Daily   Continuous Infusions: . ampicillin (OMNIPEN) IV 2 g (04/30/18 0626)  . cefTRIAXone (ROCEPHIN)  IV 2 g (04/30/18 0514)  . ferumoxytol       LOS: 0 days      [facility]  Page via [email or web address]  04/30/2018, 11:01 AM        

## 2018-04-30 NOTE — Progress Notes (Signed)
Instructions given, patient will start prep at 4 pm as ordered

## 2018-04-30 NOTE — Interval H&P Note (Signed)
History and Physical Interval Note:  04/30/2018 9:24 AM  Ryan [name]  has presented today for surgery, with the diagnosis of Anemia  The various methods of treatment have been discussed with the patient and family. After consideration of risks, benefits and other options for treatment, the patient has consented to  Procedure(s): ENTEROSCOPY (N/A) as a surgical intervention .  The patient's history has been reviewed, patient examined, no change in status, stable for surgery.  I have reviewed the patient's chart and labs.  Questions were answered to the patient's satisfaction.     [employer]

## 2018-04-30 NOTE — Plan of Care (Signed)
  Problem: Education: Goal: Knowledge of General Education information will improve Description Including pain rating scale, medication(s)/side effects and non-pharmacologic comfort measures 04/30/2018 0030 by Theador Hawthorne, RN Outcome: Progressing 04/30/2018 0027 by Theador Hawthorne, RN Outcome: Progressing   Problem: Clinical Measurements: Goal: Respiratory complications will improve 04/30/2018 0030 by Theador Hawthorne, RN Outcome: Progressing 04/30/2018 0027 by Theador Hawthorne, RN Outcome: Progressing   Problem: Nutrition: Goal: Adequate nutrition will be maintained 04/30/2018 0030 by Theador Hawthorne, RN Outcome: Progressing 04/30/2018 0027 by Theador Hawthorne, RN Outcome: Progressing   Problem: Pain Managment: Goal: General experience of comfort will improve 04/30/2018 0030 by Theador Hawthorne, RN Outcome: Progressing 04/30/2018 0027 by Theador Hawthorne, RN Outcome: Progressing   Problem: Safety: Goal: Ability to remain free from injury will improve 04/30/2018 0030 by Theador Hawthorne, RN Outcome: Progressing 04/30/2018 0027 by Theador Hawthorne, RN Outcome: Progressing   Problem: Skin Integrity: Goal: Risk for impaired skin integrity will decrease 04/30/2018 0030 by Theador Hawthorne, RN Outcome: Progressing 04/30/2018 0027 by Theador Hawthorne, RN Outcome: Progressing

## 2018-05-01 ENCOUNTER — Encounter (HOSPITAL_COMMUNITY)
Admission: EM | Disposition: A | Discharge status: Home / Self Care | Payer: Self-pay | Source: Home / Self Care | Attending: Emergency Medicine

## 2018-05-01 ENCOUNTER — Encounter (HOSPITAL_COMMUNITY): Payer: Self-pay | Admitting: *Deleted

## 2018-05-01 ENCOUNTER — Observation Stay (HOSPITAL_COMMUNITY): Payer: Medicare Other | Admitting: Certified Registered Nurse Anesthetist

## 2018-05-01 DIAGNOSIS — B952 Enterococcus as the cause of diseases classified elsewhere: Secondary | ICD-10-CM | POA: Diagnosis not present

## 2018-05-01 DIAGNOSIS — K3189 Other diseases of stomach and duodenum: Secondary | ICD-10-CM | POA: Diagnosis not present

## 2018-05-01 DIAGNOSIS — D509 Iron deficiency anemia, unspecified: Secondary | ICD-10-CM | POA: Diagnosis not present

## 2018-05-01 DIAGNOSIS — N179 Acute kidney failure, unspecified: Secondary | ICD-10-CM | POA: Diagnosis not present

## 2018-05-01 DIAGNOSIS — K922 Gastrointestinal hemorrhage, unspecified: Secondary | ICD-10-CM | POA: Diagnosis not present

## 2018-05-01 DIAGNOSIS — R7881 Bacteremia: Secondary | ICD-10-CM | POA: Diagnosis not present

## 2018-05-01 DIAGNOSIS — D124 Benign neoplasm of descending colon: Secondary | ICD-10-CM | POA: Diagnosis not present

## 2018-05-01 DIAGNOSIS — I2510 Atherosclerotic heart disease of native coronary artery without angina pectoris: Secondary | ICD-10-CM | POA: Diagnosis not present

## 2018-05-01 DIAGNOSIS — Q399 Congenital malformation of esophagus, unspecified: Secondary | ICD-10-CM | POA: Diagnosis not present

## 2018-05-01 DIAGNOSIS — K449 Diaphragmatic hernia without obstruction or gangrene: Secondary | ICD-10-CM | POA: Diagnosis not present

## 2018-05-01 DIAGNOSIS — I5032 Chronic diastolic (congestive) heart failure: Secondary | ICD-10-CM | POA: Diagnosis not present

## 2018-05-01 DIAGNOSIS — D122 Benign neoplasm of ascending colon: Secondary | ICD-10-CM | POA: Diagnosis not present

## 2018-05-01 HISTORY — PX: POLYPECTOMY: SHX5525

## 2018-05-01 HISTORY — PX: COLONOSCOPY WITH PROPOFOL: SHX5780

## 2018-05-01 LAB — CBC
HCT: 34.8 % — ABNORMAL LOW (ref 39.0–52.0)
HCT: 32.1 % — ABNORMAL LOW (ref 39.0–52.0)
Hemoglobin: 10.8 g/dL — ABNORMAL LOW (ref 13.0–17.0)
Hemoglobin: 10.3 g/dL — ABNORMAL LOW (ref 13.0–17.0)
MCH: 28.1 pg (ref 26.0–34.0)
MCH: 28.9 pg (ref 26.0–34.0)
MCHC: 31 g/dL (ref 30.0–36.0)
MCHC: 32.1 g/dL (ref 30.0–36.0)
MCV: 90.4 fL (ref 80.0–100.0)
MCV: 89.9 fL (ref 80.0–100.0)
Platelets: 205 10*3/uL (ref 150–400)
Platelets: 163 10*3/uL (ref 150–400)
RBC: 3.85 MIL/uL — AB (ref 4.22–5.81)
RBC: 3.57 MIL/uL — ABNORMAL LOW (ref 4.22–5.81)
RDW: 15.1 % (ref 11.5–15.5)
RDW: 15 % (ref 11.5–15.5)
WBC: 11.4 10*3/uL — ABNORMAL HIGH (ref 4.0–10.5)
WBC: 6.4 10*3/uL (ref 4.0–10.5)
nRBC: 0 % (ref 0.0–0.2)
nRBC: 0 % (ref 0.0–0.2)

## 2018-05-01 LAB — BASIC METABOLIC PANEL
Anion gap: 15 (ref 5–15)
BUN: 29 mg/dL — ABNORMAL HIGH (ref 8–23)
CHLORIDE: 104 mmol/L (ref 98–111)
CO2: 23 mmol/L (ref 22–32)
Calcium: 9 mg/dL (ref 8.9–10.3)
Creatinine, Ser: 2.78 mg/dL — ABNORMAL HIGH (ref 0.61–1.24)
GFR calc Af Amer: 23 mL/min — ABNORMAL LOW (ref >60)
GFR calc non Af Amer: 20 mL/min — ABNORMAL LOW (ref >60)
Glucose, Bld: 98 mg/dL (ref 70–99)
Potassium: 3.2 mmol/L — ABNORMAL LOW (ref 3.5–5.1)
Sodium: 142 mmol/L (ref 135–145)

## 2018-05-01 SURGERY — COLONOSCOPY WITH PROPOFOL
Anesthesia: Monitor Anesthesia Care

## 2018-05-01 MED ORDER — SODIUM CHLORIDE 0.9 % IV SOLN
INTRAVENOUS | Status: DC | PRN
  Administered 2018-05-01: 1000 mL via INTRAVENOUS

## 2018-05-01 MED ORDER — POTASSIUM CHLORIDE CRYS ER 20 MEQ PO TBCR: 40.0000 meq | EXTENDED_RELEASE_TABLET | Freq: Once | ORAL | Status: DC

## 2018-05-01 MED ORDER — PROPOFOL 500 MG/50ML IV EMUL
INTRAVENOUS | Status: DC | PRN
  Administered 2018-05-01: 120 ug/kg/min via INTRAVENOUS

## 2018-05-01 SURGICAL SUPPLY — 21 items

## 2018-05-01 NOTE — Plan of Care (Signed)
  Problem: Education: Goal: Knowledge of General Education information will improve Description Including pain rating scale, medication(s)/side effects and non-pharmacologic comfort measures Outcome: Progressing   Problem: Health Behavior/Discharge Planning: Goal: Ability to manage health-related needs will improve Outcome: Progressing   

## 2018-05-01 NOTE — Anesthesia Preprocedure Evaluation (Addendum)
Anesthesia Evaluation  Patient identified by MRN, date of birth, ID band Patient awake    Reviewed: Allergy & Precautions, NPO status , Patient's Chart, lab work & pertinent test results  History of Anesthesia Complications Negative for: history of anesthetic complications  Airway Mallampati: II  TM Distance: >3 FB Neck ROM: Full    Dental  (+) Dental Advisory Given, Teeth Intact   Pulmonary asthma , sleep apnea ,   Amiodarone pulmonary toxicity    breath sounds clear to auscultation       Cardiovascular hypertension, Pt. on home beta blockers and Pt. on medications + CAD, + Cardiac Stents, + Peripheral Vascular Disease (s/p b/l CEA) and +CHF  + dysrhythmias Atrial Fibrillation + pacemaker  Rhythm:Regular Rate:Normal  S/p TAVR 2018  '19 TTE - EF 50% to 55%. Mild-mod AI. Mild MR.   '19 Coronary Angio -  Prox Cx lesion is 70% stenosed. Ost 1st Mrg lesion is 75% stenosed. Previously placed Mid RCA stent (unknown type) is widely patent. Prox RCA lesion is 40% stenosed. Post Atrio lesion is 75% stenosed. Ost LAD to Prox LAD lesion is 30% stenosed.    Neuro/Psych negative neurological ROS  negative psych ROS   GI/Hepatic Neg liver ROS, hiatal hernia, GERD  Medicated and Controlled,  Endo/Other  Hypothyroidism   Renal/GU CRFRenal disease    Prostate and bladder cancer     Musculoskeletal  (+) Arthritis ,   Abdominal   Peds  Hematology  (+) anemia ,   Anesthesia Other Findings Anaphylaxis to sulfa  anemia, FOBT +, dark stools,  Reproductive/Obstetrics                            Anesthesia Physical  Anesthesia Plan  ASA: III  Anesthesia Plan: MAC   Post-op Pain Management:    Induction: Intravenous  PONV Risk Score and Plan: 1 and Propofol infusion and Treatment may vary due to age or medical condition  Airway Management Planned: Nasal Cannula and Natural  Airway  Additional Equipment: None  Intra-op Plan:   Post-operative Plan:   Informed Consent: I have reviewed the patients History and Physical, chart, labs and discussed the procedure including the risks, benefits and alternatives for the proposed anesthesia with the patient or authorized representative who has indicated his/her understanding and acceptance.   Dental advisory given  Plan Discussed with: CRNA  Anesthesia Plan Comments:         Anesthesia Quick Evaluation

## 2018-05-01 NOTE — Op Note (Signed)
[facility] Patient Name: Ryan [name] Procedure Date : 05/01/2018 MRN: [id number] Attending MD: Justice Britain , MD Date of Birth: June 18, 1929 CSN: [id number] Age: 84 Admit Type: Inpatient Procedure:                Colonoscopy Indications:              Gastrointestinal occult blood loss Providers:                Justice Britain, MD, Elna Breslow, RN, Charolette Child, Technician Referring MD:             Dr. Broadus John ([facility]) Medicines:                Monitored Anesthesia Care Complications:            No immediate complications. Estimated Blood Loss:     Estimated blood loss was minimal. Procedure:                Pre-Anesthesia Assessment:                           - Prior to the procedure, a History and Physical                            was performed, and patient medications and                            allergies were reviewed. The patient's tolerance of                            previous anesthesia was also reviewed. The risks                            and benefits of the procedure and the sedation                            options and risks were discussed with the patient.                            All questions were answered, and informed consent                            was obtained. Prior Anticoagulants: The patient has                            taken Eliquis (apixaban), last dose was 3 days                            prior to procedure. ASA Grade Assessment: III - A                            patient with severe systemic disease. After  reviewing the risks and benefits, the patient was                            deemed in satisfactory condition to undergo the                            procedure.                           After obtaining informed consent, the colonoscope                            was passed under direct vision. Throughout the                            procedure, the  patient's blood pressure, pulse, and                            oxygen saturations were monitored continuously. The                            PCF-[id number] ([id number]) peds colon was introduced                            through the anus and advanced to the 5 cm into the                            ileum. The colonoscopy was performed without                            difficulty. The patient tolerated the procedure. Scope In: 1:43:35 PM Scope Out: 2:15:14 PM Scope Withdrawal Time: 0 hours 23 minutes 44 seconds  Total Procedure Duration: 0 hours 31 minutes 39 seconds  Findings:      The digital rectal exam findings include non-thrombosed external       hemorrhoids and non-thrombosed internal hemorrhoids. Pertinent negatives       include no palpable rectal lesions.      The terminal ileum and ileocecal valve appeared normal.      A 5 mm polyp was found in the ascending colon. The polyp was sessile.       The polyp was removed with a cold snare. Resection and retrieval were       complete. There was some mild oozing. To prevent bleeding after the       polypectomy, two hemostatic clips were successfully placed (MR       conditional). There was no bleeding at the end of the procedure.      A 25 mm polyp was found in the ascending colon. The polyp was sessile.      A 20 mm polyp was found in the descending colon. The polyp was sessile.      Three sessile polyps were found in the sigmoid colon and transverse       colon. The polyps were 5 to 10 mm in size.      A localized area of mildly nodular mucosa was found in the transverse       colon (potentially  from scope trauma) was noted with a bit of       hematin/clot. There was no active oozing. To prevent bleeding, one       hemostatic clip was successfully placed (MR conditional). There was no       bleeding at the end of the procedure.      Non-bleeding non-thrombosed external and internal hemorrhoids were found       during retroflexion,  during perianal exam and during digital exam. The       hemorrhoids were Grade II (internal hemorrhoids that prolapse but reduce       spontaneously). Impression:               - Non-thrombosed external hemorrhoids and                            non-thrombosed internal hemorrhoids found on                            digital rectal exam.                           - The examined portion of the ileum was normal.                           - One 5 mm polyp in the ascending colon, removed                            with a cold snare. Resected and retrieved. Clips                            (MR conditional) were placed.                           - One 25 mm polyp in the ascending colon.                           - One 20 mm polyp in the descending colon.                           - Three 5 to 10 mm polyps in the sigmoid colon and                            in the transverse colon.                           - Nodular mucosa from scope trauma with some heme,                            in the transverse colon. Clip (MR conditional) was                            placed to decrease risk of bleeding.                           - Non-bleeding non-thrombosed external and internal  hemorrhoids. Recommendation:           - The patient will be observed post-procedure,                            until all discharge criteria are met.                           - Return patient to hospital ward for ongoing care.                           - Advance diet as tolerated.                           - Await pathology results.                           - Repeat colonoscopy is not recommended for polyp                            surveillance at this time - discussed with Dr.                            Loletha Carrow and with family.                           - Polyps were not resected due to the invasiveness                            and need for anticoagulation to be restarted soon.                            - Based on decision from Cardiology and primary                            medical service, can restart Eliquis and make                            decision about close monitoring of Hgb/Hct as                            outpatient.                           - Patient should be continued on Oral Iron once                            daily to try and optimize stores.                           - The findings and recommendations have been                            discussed with you.                           -  The findings and recommendations were discussed                            with your family.                           - The findings and recommendations were discussed                            with your referring physician. Procedure Code(s):        --- Professional ---                           [phone], Colonoscopy, flexible; with removal of                            tumor(s), polyp(s), or other lesion(s) by snare                            technique Diagnosis Code(s):        --- Professional ---                           K64.1, Second degree hemorrhoids                           K64.4, Residual hemorrhoidal skin tags                           D12.2, Benign neoplasm of ascending colon                           D12.4, Benign neoplasm of descending colon                           D12.5, Benign neoplasm of sigmoid colon                           D12.3, Benign neoplasm of transverse colon (hepatic                            flexure or splenic flexure)                           K63.89, Other specified diseases of intestine                           R19.5, Other fecal abnormalities CPT copyright 2018 American [employer]. All rights reserved. The codes documented in this report are preliminary and upon coder review may  be revised to meet current compliance requirements. Justice Britain, MD 05/01/2018 2:48:17 PM Number of Addenda: 0

## 2018-05-01 NOTE — Anesthesia Postprocedure Evaluation (Signed)
Anesthesia Post Note  Patient: Ryan [name]  Procedure(s) Performed: COLONOSCOPY WITH PROPOFOL (N/A ) POLYPECTOMY HEMOSTASIS CLIP PLACEMENT     Patient location during evaluation: PACU Anesthesia Type: MAC Level of consciousness: awake and alert Pain management: pain level controlled Vital Signs Assessment: post-procedure vital signs reviewed and stable Respiratory status: spontaneous breathing, nonlabored ventilation, respiratory function stable and patient connected to nasal cannula oxygen Cardiovascular status: stable and blood pressure returned to baseline Postop Assessment: no apparent nausea or vomiting Anesthetic complications: no    Last Vitals:  Vitals:   05/01/18 1420 05/01/18 1430  BP: (!) 167/59 (!) 171/57  Pulse: 68 64  Resp: 10 13  Temp: 36.8 C   SpO2: 95% 97%    Last Pain:  Vitals:   05/01/18 1430  TempSrc:   PainSc: 0-No pain                 Nanea Jared P Lyndel Dancel

## 2018-05-01 NOTE — Transfer of Care (Signed)
Immediate Anesthesia Transfer of Care Note  Patient: Ryan [name]  Procedure(s) Performed: COLONOSCOPY WITH PROPOFOL (N/A ) POLYPECTOMY HEMOSTASIS CLIP PLACEMENT  Patient Location: PACU  Anesthesia Type:MAC and General  Level of Consciousness: sedated, drowsy and patient cooperative  Airway & Oxygen Therapy: Patient Spontanous Breathing and Patient connected to nasal cannula oxygen  Post-op Assessment: Report given to RN and Post -op Vital signs reviewed and stable  Post vital signs: Reviewed and stable  Last Vitals:  Vitals Value Taken Time  BP    Temp    Pulse 68 05/01/2018  2:20 PM  Resp 10 05/01/2018  2:20 PM  SpO2 95 % 05/01/2018  2:20 PM    Last Pain:  Vitals:   05/01/18 1243  TempSrc: Oral  PainSc: 0-No pain         Complications: No apparent anesthesia complications

## 2018-05-01 NOTE — Progress Notes (Addendum)
Second half of bowel prep started at 0245 at the request of the patient's son.  Bowel prep completed prior to 0500.

## 2018-05-01 NOTE — Plan of Care (Signed)
  Problem: Clinical Measurements: Goal: Respiratory complications will improve Outcome: Progressing Goal: Cardiovascular complication will be avoided Outcome: Progressing   Problem: Activity: Goal: Risk for activity intolerance will decrease Outcome: Progressing   Problem: Coping: Goal: Level of anxiety will decrease Outcome: Progressing   Problem: Pain Managment: Goal: General experience of comfort will improve Outcome: Progressing   Problem: Safety: Goal: Ability to remain free from injury will improve Outcome: Progressing   Problem: Skin Integrity: Goal: Risk for impaired skin integrity will decrease Outcome: Progressing

## 2018-05-01 NOTE — Interval H&P Note (Signed)
History and Physical Interval Note:  05/01/2018 12:56 PM  Ryan [name]  has presented today for surgery, with the diagnosis of anemia, FOBT +, dark stools, Eliquis on hold.  The various methods of treatment have been discussed with the patient and family. After consideration of risks, benefits and other options for treatment, the patient has consented to  Procedure(s): COLONOSCOPY WITH PROPOFOL (N/A) as a surgical intervention .  The patient's history has been reviewed, patient examined, no change in status, stable for surgery.  I have reviewed the patient's chart and labs.  Questions were answered to the patient's satisfaction.     [employer]

## 2018-05-01 NOTE — Progress Notes (Signed)
[facility] PROGRESS NOTE    Progress Note  Ryan [name]  YBO:[id number] DOB: 1929-12-23 DOA: 04/28/2018 PCP: Dorothyann Peng, NP     Brief Narrative:   Ryan [name] is an 84 y.o. 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 via PICC line , history of TAVR who presented with generalized weakness dark stools and shortness of breath  Assessment/Plan:   Iron deficiency anemia with melena /heme positive stools  -Baseline hemoglobin around 10, on admission hemoglobin was 7.9 with symptoms,  -also has baseline chronic anemia from CKD 4  -Eliquis on hold -Appreciate gastroenterology consultation, EGD 11/29 unremarkable except friable mucosa in duodenum and jejunum -Continue oral PPI, plan for colonoscopy today -Hemoglobin is 10.8 this morning, higher than expected expect this to equilibrate in the next 48 hours -Given IV iron yesterday -Monitor CBC -Ambulate, PT eval  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 -Currently in sinus rhythm, heart rate controlled, continue metoprolol -Eliquis on hold  Acute kidney injury on CKD stage IV -Baseline creatinine ranges from 2.3-2.8 -Creatinine went up to 3.4 the setting of blood loss, given blood and gentle IV fluids now off -Creatinine improving, down to 2.7 now which is his baseline  Hyperlipidemia: Continue  Lipitor.  History of CAD: -stable, continue Lipitor,  Imdur-cut down dose  Asthma: Continue inhalers.  Hypothyroidism: Continue Synthroid  Chronic diastolic heart failure: Last 2D echo on 03/30/2018 EF showed 55% with aortic valve regurgitation. -Clinically appears euvolemic now -Fluids discontinued, Lasix on hold resume likely in 1 to 2 days  H/o permanent cardiac pacemaker   DVT prophylaxis: SCD Family Communication:wife at bedside Disposition Plan: Home pending workup  Code Status:     Code Status Orders  (From admission, onward)         Start     Ordered   04/28/18 2047  Full code  Continuous     04/28/18 2047        Code Status History    Date Active Date Inactive Code Status Order ID Comments User Context   03/29/2018 1300 04/02/2018 1704 Full Code [id number]  Karmen Bongo, MD ED   01/15/2018 1540 01/19/2018 1724 Full Code [id number]  Abigail Butts., PA-C Inpatient   11/07/2017 1201 11/14/2017 1709 Full Code [id number]  Radene Gunning, NP ED   10/23/2017 1758 10/24/2017 2309 Full Code [id number]  Lavina Hamman, MD ED   06/10/2016 1003 06/12/2016 1605 Full Code [id number]  Sherren Mocha, MD Inpatient   04/30/2016 1328 05/01/2016 1512 Full Code [id number]  Sherren Mocha, MD Inpatient   03/18/2016 1506 03/22/2016 1615 Full Code [id number]  Eileen Stanford, PA-C Inpatient   03/18/2016 1205 03/18/2016 1506 Full Code [id number]  Belva Crome, MD ED   01/17/2016 1003 01/17/2016 1649 Full Code [id number]  Belva Crome, MD Inpatient        IV Access:    Peripheral IV   Procedures and diagnostic studies:   No results found.   Medical Consultants:    None.  Anti-Infectives:   IV ampicillin and Rocephin.  Subjective:   -No new complaints, feels well, tolerated bowel prep last night  Objective:    Vitals:   04/30/18 1040 04/30/18 1108 04/30/18 1930 05/01/18 0546  BP: (!) 109/52 (!) 154/74 (!) 170/63 (!) 153/63  Pulse: 61 77 71 61  Resp: 15  20 18 16   Temp:  (!) 97.5 F (36.4 C) 97.7 F (36.5 C) 97.6 F (36.4 C)  TempSrc:  Oral Oral Oral  SpO2: 99% 98% 95% 95%  Weight:    60.4 kg  Height:        Intake/Output Summary (Last 24 hours) at 05/01/2018 1142 Last data filed at 05/01/2018 0941 Gross per 24 hour  Intake 1421.12 ml  Output 301 ml  Net 1120.12 ml   Filed Weights   04/29/18 0538 04/30/18 0622 05/01/18 0546  Weight: 61.3 kg 60.1 kg 60.4 kg    Exam: Gen: Awake, Alert, Oriented X 3, sitting up in bed, pleasant, no distress HEENT: Pupils are equal reactive, oral mucosa is moist and pink Lungs: Clear bilaterally CVS: RRR,No Gallops,Rubs or new Murmurs Abd: soft, Non tender, non distended, BS present Extremities: No edema Skin: no new rashes Psychiatry: Mood & affect appropriate.    Data Reviewed:    Labs: Basic Metabolic Panel: Recent Labs  Lab 04/28/18 1752 04/29/18 0805 05/01/18 0603  NA 139 141 142  K 4.3 4.0 3.2*  CL 103 105 104  CO2 27 25 23   GLUCOSE 109* 94 98  BUN 48* 44* 29*  CREATININE 3.21* 2.93* 2.78*  CALCIUM 9.1 8.8* 9.0   GFR Estimated Creatinine Clearance: 16.6 mL/min (A) (by C-G formula based on SCr of 2.78 mg/dL (H)). Liver Function Tests: Recent Labs  Lab 04/28/18 1752  AST 21  ALT 11  ALKPHOS 54  BILITOT 0.3  PROT 6.2*  ALBUMIN 3.2*   No results for input(s): LIPASE, AMYLASE in the last 168 hours. No results for input(s): AMMONIA in the last 168 hours. Coagulation profile Recent Labs  Lab 04/28/18 2013 04/29/18 1003  INR 1.27 1.17    CBC: Recent Labs  Lab 04/28/18 0939  04/29/18 0805 04/29/18 2034 04/30/18 0336 04/30/18 2131 05/01/18 0603  WBC 5.9   < > 6.3 6.2 6.2 14.7* 11.4*  NEUTROABS 3.9  --   --   --   --   --   --   HGB 7.8 Repeated and verified X2.*   < > 10.3* 10.4* 9.6* 9.8* 10.8*  HCT 23.5 Repeated and verified X2.*   < > 31.8* 32.2* 30.0* 31.1* 34.8*  MCV 91.4   < > 89.1 89.4 89.6 90.4 90.4  PLT 191.0   < > 171 181 169 174 205   < > =  values in this interval not displayed.   Cardiac Enzymes: No results for input(s): CKTOTAL, CKMB, CKMBINDEX, TROPONINI in the last 168 hours. BNP (last 3 results) Recent Labs    10/20/17 1058 01/14/18 1309  PROBNP 146.0* 3,168*   CBG: No results for input(s): GLUCAP in the last 168 hours. D-Dimer: No results for input(s): DDIMER in the last 72 hours. Hgb A1c: No results for input(s): HGBA1C in the last 72 hours. Lipid Profile: No results for input(s): CHOL, HDL, LDLCALC, TRIG, CHOLHDL, LDLDIRECT in the last 72 hours. Thyroid function studies: No results for input(s): TSH, T4TOTAL, T3FREE, THYROIDAB in the last 72 hours.  Invalid input(s): FREET3 Anemia work up: No results for input(s): VITAMINB12, FOLATE, FERRITIN, TIBC, IRON, RETICCTPCT in the  last 72 hours. Sepsis Labs: Recent Labs  Lab 04/29/18 2034 04/30/18 0336 04/30/18 2131 05/01/18 0603  WBC 6.2 6.2 14.7* 11.4*   Microbiology No results found for this or any previous visit (from the past 240 hour(s)).   Medications:   . amLODipine  5 mg Oral Daily  . atorvastatin  40 mg Oral q1800  . cholecalciferol  1,000 Units Oral Once per day on Mon Thu  . isosorbide mononitrate  30 mg Oral Daily  . latanoprost  1 drop Both Eyes QHS  . levothyroxine  12.5 mcg Oral QAC breakfast  . metoprolol succinate  100 mg Oral Daily  . Netarsudil Dimesylate  1 drop Both Eyes QHS  . pantoprazole  40 mg Oral BID  . vitamin B-12  1,000 mcg Oral Daily   Continuous Infusions: . sodium chloride 1,000 mL (05/01/18 0611)  . ampicillin (OMNIPEN) IV 300 mL/hr at 05/01/18 0611  . cefTRIAXone (ROCEPHIN)  IV Stopped (05/01/18 0446)     LOS: 0 days   Domenic Polite  [facility]  Page via [email or web address]  05/01/2018, 11:42 AM

## 2018-05-02 ENCOUNTER — Encounter (HOSPITAL_COMMUNITY): Payer: Self-pay | Admitting: Gastroenterology

## 2018-05-02 DIAGNOSIS — R195 Other fecal abnormalities: Secondary | ICD-10-CM

## 2018-05-02 DIAGNOSIS — R7881 Bacteremia: Secondary | ICD-10-CM | POA: Diagnosis not present

## 2018-05-02 DIAGNOSIS — D508 Other iron deficiency anemias: Secondary | ICD-10-CM | POA: Diagnosis not present

## 2018-05-02 DIAGNOSIS — K922 Gastrointestinal hemorrhage, unspecified: Secondary | ICD-10-CM | POA: Diagnosis not present

## 2018-05-02 DIAGNOSIS — K3189 Other diseases of stomach and duodenum: Secondary | ICD-10-CM | POA: Diagnosis not present

## 2018-05-02 DIAGNOSIS — K449 Diaphragmatic hernia without obstruction or gangrene: Secondary | ICD-10-CM | POA: Diagnosis not present

## 2018-05-02 DIAGNOSIS — N184 Chronic kidney disease, stage 4 (severe): Secondary | ICD-10-CM | POA: Diagnosis not present

## 2018-05-02 DIAGNOSIS — N179 Acute kidney failure, unspecified: Secondary | ICD-10-CM | POA: Diagnosis not present

## 2018-05-02 DIAGNOSIS — I5032 Chronic diastolic (congestive) heart failure: Secondary | ICD-10-CM | POA: Diagnosis not present

## 2018-05-02 LAB — BASIC METABOLIC PANEL
Anion gap: 13 (ref 5–15)
Anion gap: 12 (ref 5–15)
BUN: 27 mg/dL — ABNORMAL HIGH (ref 8–23)
BUN: 28 mg/dL — AB (ref 8–23)
CO2: 23 mmol/L (ref 22–32)
CO2: 23 mmol/L (ref 22–32)
CREATININE: 2.77 mg/dL — AB (ref 0.61–1.24)
Calcium: 8.6 mg/dL — ABNORMAL LOW (ref 8.9–10.3)
Calcium: 8.6 mg/dL — ABNORMAL LOW (ref 8.9–10.3)
Chloride: 105 mmol/L (ref 98–111)
Chloride: 106 mmol/L (ref 98–111)
Creatinine, Ser: 2.64 mg/dL — ABNORMAL HIGH (ref 0.61–1.24)
GFR calc Af Amer: 23 mL/min — ABNORMAL LOW (ref >60)
GFR calc Af Amer: 24 mL/min — ABNORMAL LOW (ref >60)
GFR calc non Af Amer: 21 mL/min — ABNORMAL LOW (ref >60)
GFR, EST NON AFRICAN AMERICAN: 20 mL/min — AB (ref >60)
GLUCOSE: 134 mg/dL — AB (ref 70–99)
Glucose, Bld: 87 mg/dL (ref 70–99)
Potassium: 2.8 mmol/L — ABNORMAL LOW (ref 3.5–5.1)
Potassium: 3.5 mmol/L (ref 3.5–5.1)
Sodium: 141 mmol/L (ref 135–145)
Sodium: 141 mmol/L (ref 135–145)

## 2018-05-02 LAB — CBC
HCT: 31.6 % — ABNORMAL LOW (ref 39.0–52.0)
Hemoglobin: 10 g/dL — ABNORMAL LOW (ref 13.0–17.0)
MCH: 28.2 pg (ref 26.0–34.0)
MCHC: 31.6 g/dL (ref 30.0–36.0)
MCV: 89.3 fL (ref 80.0–100.0)
PLATELETS: 181 10*3/uL (ref 150–400)
RBC: 3.54 MIL/uL — ABNORMAL LOW (ref 4.22–5.81)
RDW: 14.9 % (ref 11.5–15.5)
WBC: 6.6 10*3/uL (ref 4.0–10.5)
nRBC: 0 % (ref 0.0–0.2)

## 2018-05-02 MED ORDER — HEPARIN SOD (PORK) LOCK FLUSH 100 UNIT/ML IV SOLN
250.0000 [IU] | INTRAVENOUS | Status: AC | PRN
  Administered 2018-05-02: 250 [IU]

## 2018-05-02 MED ORDER — PANTOPRAZOLE SODIUM 40 MG PO TBEC: 40.0000 mg | DELAYED_RELEASE_TABLET | Freq: Every day | ORAL | 0 refills | Status: DC

## 2018-05-02 MED ORDER — POTASSIUM CHLORIDE CRYS ER 20 MEQ PO TBCR
40.0000 meq | EXTENDED_RELEASE_TABLET | Freq: Once | ORAL | Status: AC
  Administered 2018-05-02: 40 meq via ORAL
  Filled 2018-05-02: qty 2

## 2018-05-02 MED ORDER — POTASSIUM CHLORIDE CRYS ER 20 MEQ PO TBCR: 40.0000 meq | EXTENDED_RELEASE_TABLET | Freq: Every day | ORAL | 0 refills | Status: DC

## 2018-05-02 NOTE — Progress Notes (Signed)
Potassium 2.8, NP paged

## 2018-05-02 NOTE — Discharge Summary (Signed)
Physician Discharge Summary  Ryan [name] ZOX:[id number] DOB: 1930-04-17 DOA: 04/28/2018  PCP: Dorothyann Peng, NP  Admit date: 04/28/2018 Discharge date: 05/02/2018  Time spent: 45 minutes  Recommendations for Outpatient Follow-up:  -PCP in 1 week, please check CBC and K at FU, please start Iron Po at FU visit -Ena Dawley in 2 weeks -Please consider nephrology follow-up in 1 month, would benefit from Erythropeotin -Infectious disease Dr. Baxter Flattery in 2 weeks, Continue IV ceftriaxone and ampicillin for recent enterococcal bacteremia/presumed aortic valve endocarditis until 12/10, please discontinue PICC line after antibiotic course completed, plan per ID to start oral amoxicillin for prolonged suppression post IV antibiotic course  Discharge Diagnoses:  Principal Problem:   GIB (gastrointestinal bleeding)   Melena   Acute blood loss anemia   Iron Deficiency anemia   PAF (paroxysmal atrial fibrillation) ([place])   Pure hypercholesterolemia   CAD (coronary artery disease), native coronary artery   Hypertension   Prosthetic valve endocarditis ([place])   S/P AVR   Presence of permanent cardiac pacemaker   Bacteremia due to Enterococcus   HLD (hyperlipidemia)   Asthma   Hypothyroidism   Chronic diastolic CHF (congestive heart failure) ([facility])   Symptomatic anemia   Acute renal failure superimposed on stage 4 chronic kidney disease ([place])   Discharge Condition: Stable, improved  Diet recommendation: Low sodium  Filed Weights   04/30/18 0622 05/01/18 0546 05/02/18 0537  Weight: 60.1 kg 60.4 kg 58.5 kg    History of present illness:  Ryan [name] is an 84 y.o. 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 via PICC line , history of  TAVR who presented with generalized weakness dark stools and shortness of breath   Hospital Course:   Iron deficiency anemia with melena /heme positive stools  -Baseline hemoglobin around 10, admitted with symptomatic anemia, hemoglobin was down to 7.9 with melena -Eliquis was held, lumbar gastroenterology was consulted EGD 11/29 unremarkable except friable mucosa in duodenum and jejunum, started on oral PPI -Underwent colonoscopy yesterday afternoon, noted ascending colon polyp which was removed, and a few other smaller polyps noted, nonbleeding hemorrhoids noted as well -No evidence of active ongoing bleeding at this time, hemoglobin has responded appropriately to 2 units of PRBC given on admission -Hemoglobin is stable at 10.0 this morning, we have restarted his Eliquis today -recommend close monitoring of CBC at discharge, consider capsule endoscopy if he continues to have frequent drop in his hemoglobin down the road -Also given IV iron inpatient, start oral iron in 1 week -In addition has chronic kidney disease stage IV and component of chronic anemia from this, has nephrology follow-up and Epogen -Due to high chads 2 vas score and stroke risk I have restarted his Eliquis today at discharge  Sent admission for presumed aortic valve endocarditis /recurrent enterococcal bacteremia  -Had recurrent enterococcal bacteremia, TEE was negative however in the setting of prosthetic valve and permanent pacemaker decision was made to treat with 6-week course of ampicillin and ceftriaxone -Stable, -Patient has a PICC line, end date of antibiotics is 12/10 -Followed by home health nursing for IV antibiotics, per ID note from last hospitalization they have recommended chronic suppression with oral amoxicillin after IV antibiotic course has completed  Chronic atrial fibrillation: CHA2DS2-VASc is 5. -Currently in sinus rhythm, heart rate controlled, continue metoprolol -Eliquis resumed  after Gi  workup  Acute kidney injury on CKD stage IV -Baseline creatinine ranges from 2.3-2.8 -Creatinine went up to 3.4 the setting of blood loss, given blood and gentle IV fluids now off -Creatinine improving, down to 2.7which is his baseline  Hyperlipidemia: Continue Lipitor.  History of CAD: -stable, continue Lipitor,  Imdur- dose was reduced and then back to regular dose  Asthma: Continue inhalers.  Hypothyroidism: Continue Synthroid  Chronic diastolic heart failure: Last 2D echo on 03/30/2018 EF showed 55% with aortic valve regurgitation. -Clinically appears euvolemic now -Fluids discontinued, Lasix resumed  H/o permanent cardiac pacemaker   Colonoscopy  Impression:    - Non-thrombosed external hemorrhoids and                            non-thrombosed internal hemorrhoids found on                            digital rectal exam.                           - The examined portion of the ileum was normal.                           - One 5 mm polyp in the ascending colon, removed                            with a cold snare. Resected and retrieved. Clips                            (MR conditional) were placed.                           - One 25 mm polyp in the ascending colon.                           - One 20 mm polyp in the descending colon.                           - Three 5 to 10 mm polyps in the sigmoid colon and                            in the transverse colon.                           - Nodular mucosa from scope trauma with some heme,                            in the transverse colon. Clip (MR conditional) was                            placed to decrease risk of bleeding.                           - Non-bleeding non-thrombosed external and internal  hemorrhoids.  EGD: Impression:               - No gross lesions in proximal/middle esophagus.                            Slightly tortuous distal esophagus.                           -  3 cm hiatal hernia. Gastroesophageal flap valve                            classified as Hill Grade IV (no fold, wide open                            lumen, hiatal hernia present). No Cameron's lesions                            noted.                           - No gross lesions in the stomach.                           - Normal appearing duodenal mucosa that had some                            mild friability.                           - Normal appearing proximal jejunum mucosa that had                            some mild friability. Tattooed distal extent of                            push enteroscopy.  Discharge Exam: Vitals:   05/02/18 0535 05/02/18 0828  BP: (!) 169/67 (!) 168/68  Pulse: 62 60  Resp: 16   Temp: 97.9 F (36.6 C)   SpO2: 93%     General: AAOx3 Cardiovascular: S1S2/RRR Respiratory: CTAB  Discharge Instructions   Discharge Instructions    Diet - low sodium heart healthy   Complete by:  As directed    Increase activity slowly   Complete by:  As directed      Allergies as of 05/02/2018      Reactions   Ciprofloxacin Rash   Hydrochlorothiazide Rash   Sulfa Antibiotics Anaphylaxis   Ace Inhibitors Cough   Losartan Cough   Carvedilol Cough   Pt states it "causes him too cough."   Lisinopril Cough   Pt reports worsening cough and "feeling wheezy."   Soy Allergy Nausea And Vomiting      Medication List    STOP taking these medications   albuterol (2.5 MG/3ML) 0.083% nebulizer solution Commonly known as:  PROVENTIL   budesonide-formoterol 80-4.5 MCG/ACT inhaler Commonly known as:  SYMBICORT   lansoprazole 15 MG capsule Commonly known as:  PREVACID   menthol-cetylpyridinium 3 MG lozenge Commonly known as:  CEPACOL   omeprazole 20 MG capsule Commonly known  as:  PRILOSEC   senna 8.6 MG Tabs tablet Commonly known as:  SENOKOT     TAKE these medications   acetaminophen-codeine 300-30 MG tablet Commonly known as:  TYLENOL #3 Take 0.5-1  tablets by mouth every 4 (four) hours as needed for moderate pain.   amLODipine 5 MG tablet Commonly known as:  NORVASC Take 1 tablet (5 mg total) by mouth daily.   amoxicillin 500 MG tablet Commonly known as:  AMOXIL Take 1 tablet (500 mg total) by mouth 2 (two) times daily. Start taking on:  05/12/2018   ampicillin  IVPB Inject 2 g into the vein every 8 (eight) hours. As a continuous infusion. Indication: PVE/PPM infection Last Day of Therapy:  May 10, 2018 Labs - Once weekly:  CBC/D and BMP, Labs - Every other week:  ESR and CRP   apixaban 2.5 MG Tabs tablet Commonly known as:  ELIQUIS Take 1 tablet (2.5 mg total) by mouth 2 (two) times daily.   atorvastatin 40 MG tablet Commonly known as:  LIPITOR Take 1 tablet (40 mg total) by mouth daily at 6 PM.   cefTRIAXone  IVPB Commonly known as:  ROCEPHIN Inject 2 g into the vein every 12 (twelve) hours. Indication:  PVE/PPM infection Last Day of Therapy:  May 10, 2018 Labs - Once weekly:  CBC/D and BMP, Labs - Every other week:  ESR and CRP   cholecalciferol 1000 units tablet Commonly known as:  VITAMIN D Take 1,000 Units by mouth 2 (two) times a week.   furosemide 40 MG tablet Commonly known as:  LASIX Take 1 tablet (40 mg total) by mouth daily. What changed:    when to take this  reasons to take this   isosorbide mononitrate 60 MG 24 hr tablet Commonly known as:  IMDUR Take 1 tablet (60 mg total) by mouth daily.   latanoprost 0.005 % ophthalmic solution Commonly known as:  XALATAN Place 1 drop into both eyes at bedtime.   levothyroxine 25 MCG tablet Commonly known as:  SYNTHROID, LEVOTHROID Take 0.5 tablets (12.5 mcg total) by mouth daily before breakfast.   metoprolol succinate 100 MG 24 hr tablet Commonly known as:  TOPROL-XL Take 1 tablet (100 mg total) by mouth daily. Take with or immediately following a meal.   nitroGLYCERIN 0.4 MG SL tablet Commonly known as:  NITROSTAT Place 1 tablet (0.4 mg  total) under the tongue every 5 (five) minutes x 3 doses as needed for chest pain.   pantoprazole 40 MG tablet Commonly known as:  PROTONIX Take 1 tablet (40 mg total) by mouth daily.   polyethylene glycol packet Commonly known as:  MIRALAX / GLYCOLAX Take 17 g by mouth daily. What changed:    when to take this  reasons to take this   potassium chloride SA 20 MEQ tablet Commonly known as:  K-DUR,KLOR-CON Take 2 tablets (40 mEq total) by mouth daily. With daily lasix   RHOPRESSA 0.02 % Soln Generic drug:  Netarsudil Dimesylate Place 1 drop into both eyes at bedtime.   vitamin B-12 1000 MCG tablet Commonly known as:  CYANOCOBALAMIN Take 1,000 mcg by mouth daily.      Allergies  Allergen Reactions  . Ciprofloxacin Rash  . Hydrochlorothiazide Rash  . Sulfa Antibiotics Anaphylaxis  . Ace Inhibitors Cough  . Losartan Cough  . Carvedilol Cough    Pt states it "causes him too cough."  . Lisinopril Cough    Pt reports worsening cough and "feeling wheezy."  .  Soy Allergy Nausea And Vomiting   Follow-up Information    Dorothyann Peng, NP. Schedule an appointment as soon as possible for a visit in 1 week(s).   Specialty:  Family Medicine Why:  please check CBC and Potassium at FU  and get prescription for Iron Contact information: [place] [address] [phone]        Dorothy Spark, MD. Schedule an appointment as soon as possible for a visit in 2 week(s).   Specialty:  Cardiology Contact information: [place] [address] [phone]        Nephrologist. Go in 1 month(s).            The results of significant diagnostics from this hospitalization (including imaging, microbiology, ancillary and laboratory) are listed below for reference.    Significant Diagnostic Studies: Dg Chest 2 View  Result Date: 04/28/2018 CLINICAL DATA:  Dyspnea, chest pain, anemia EXAM: CHEST - 2 VIEW COMPARISON:   03/28/2018 chest radiograph. FINDINGS: Stable configuration of 2 lead left subclavian pacemaker and aortic valve prosthesis. Right PICC terminates in middle third of the SVC. Stable cardiomediastinal silhouette with top-normal heart size. No pneumothorax. No pleural effusion. Lungs appear clear, with no acute consolidative airspace disease and no pulmonary edema. IMPRESSION: No active cardiopulmonary disease. Electronically Signed   By: Ilona Sorrel M.D.   On: 04/28/2018 18:55    Microbiology: No results found for this or any previous visit (from the past 240 hour(s)).   Labs: Basic Metabolic Panel: Recent Labs  Lab 04/28/18 1752 04/29/18 0805 05/01/18 0603 05/02/18 0326 05/02/18 1129  NA 139 141 142 141 141  K 4.3 4.0 3.2* 2.8* 3.5  CL 103 105 104 105 106  CO2 _0 GLUCOSE 109* 94 98 87 134*  BUN 48* 44* 29* 27* 28*  CREATININE 3.21* 2.93* 2.78* 2.77* 2.64*  CALCIUM 9.1 8.8* 9.0 8.6* 8.6*   Liver Function Tests: Recent Labs  Lab 04/28/18 1752  AST 21  ALT 11  ALKPHOS 54  BILITOT 0.3  PROT 6.2*  ALBUMIN 3.2*   No results for input(s): LIPASE, AMYLASE in the last 168 hours. No results for input(s): AMMONIA in the last 168 hours. CBC: Recent Labs  Lab 04/28/18 0939  04/30/18 0336 04/30/18 2131 05/01/18 0603 05/01/18 1933 05/02/18 0326  WBC 5.9   < > 6.2 14.7* 11.4* 6.4 6.6  NEUTROABS 3.9  --   --   --   --   --   --   HGB 7.8 Repeated and verified X2.*   < > 9.6* 9.8* 10.8* 10.3* 10.0*  HCT 23.5 Repeated and verified X2.*   < > 30.0* 31.1* 34.8* 32.1* 31.6*  MCV 91.4   < > 89.6 90.4 90.4 89.9 89.3  PLT 191.0   < > 169 174 205 163 181   < > = values in this interval not displayed.   Cardiac Enzymes: No results for input(s): CKTOTAL, CKMB, CKMBINDEX, TROPONINI in the last 168 hours. BNP: BNP (last 3 results) Recent Labs    10/23/17 1938 11/07/17 0849  BNP 146.7* 219.3*    ProBNP (last 3 results) Recent Labs    10/20/17 1058 01/14/18 1309   PROBNP 146.0* 3,168*    CBG: No results for input(s): GLUCAP in the last 168 hours.     Signed:  Domenic Polite MD.  [facility] 05/02/2018, 2:28 PM

## 2018-05-02 NOTE — Progress Notes (Signed)
Patient resting comfortably during shift report. Denies complaints.  

## 2018-05-02 NOTE — Evaluation (Signed)
Physical Therapy Evaluation Patient Details Name: Ryan [name] MRN: [id number] DOB: 1929/11/26 Today's Date: 05/02/2018   History of Present Illness  Ryan [name] is a 84 y.o. male with medical history significant of hypertension, hyperlipidemia, asthma, GERD, hypothyroidism, diverticulosis, dCHF, CAD, PVD, atrial fibrillation on Eliquis, OSA not on CPAP, pacemaker placement due to tachy-bradycardia syndrome, CKD-4, bacteria aortic valve endocarditis on IV ampicillin and Rocephin currently (s/p of AVR), who presents with generalized weakness, dark stool, shortness of breath.    Clinical Impression  Patient evaluated by Physical Therapy with no further acute PT needs identified. All education has been completed and the patient has no further questions. PTA living with wife, mod I with all mobility utilizing  SPC for ambulation, with active lifestyle. Today pt walking the unit without issue, discussed gradual reconditioning at home pt and wife comfortable  See below for any follow-up Physical Therapy or equipment needs. PT is signing off. Thank you for this referral.     Follow Up Recommendations No PT follow up    Equipment Recommendations  None recommended by PT    Recommendations for Other Services       Precautions / Restrictions Precautions Precautions: None Restrictions Weight Bearing Restrictions: No      Mobility  Bed Mobility Overal bed mobility: Independent                Transfers Overall transfer level: Independent                  Ambulation/Gait Ambulation/Gait assistance: Supervision Gait Distance (Feet): 200 Feet Assistive device: Straight cane Gait Pattern/deviations: WFL(Within Functional Limits) Gait velocity: decreased      Stairs            Wheelchair Mobility    Modified Rankin (Stroke Patients Only)       Balance                                             Pertinent Vitals/Pain Pain  Assessment: No/denies pain    Home Living Family/patient expects to be discharged to:: Private residence Living Arrangements: Spouse/significant other Available Help at Discharge: Family;Available 24 hours/day Type of Home: House Home Access: Stairs to enter Entrance Stairs-Rails: Can reach both Entrance Stairs-Number of Steps: 3 Home Layout: One level Home Equipment: Walker - 2 wheels;Cane - single point;Shower seat      Prior Function Level of Independence: Independent with assistive device(s)         Comments: uses SPC indoors, RW when out of home; very active and rows/rides seated bike      Hand Dominance        Extremity/Trunk Assessment   Upper Extremity Assessment Upper Extremity Assessment: Overall WFL for tasks assessed    Lower Extremity Assessment Lower Extremity Assessment: Overall WFL for tasks assessed       Communication   Communication: No difficulties  Cognition Arousal/Alertness: Awake/alert Behavior During Therapy: WFL for tasks assessed/performed Overall Cognitive Status: Within Functional Limits for tasks assessed                                        General Comments      Exercises     Assessment/Plan    PT Assessment    PT Problem List  PT Treatment Interventions      PT Goals (Current goals can be found in the Care Plan section)  Acute Rehab PT Goals Patient Stated Goal: go home PT Goal Formulation: With patient Time For Goal Achievement: 05/16/18 Potential to Achieve Goals: Good    Frequency     Barriers to discharge        Co-evaluation               AM-PAC PT "6 Clicks" Mobility  Outcome Measure Help needed turning from your back to your side while in a flat bed without using bedrails?: None Help needed moving from lying on your back to sitting on the side of a flat bed without using bedrails?: None Help needed moving to and from a bed to a chair (including a wheelchair)?:  None Help needed standing up from a chair using your arms (e.g., wheelchair or bedside chair)?: None Help needed to walk in hospital room?: None Help needed climbing 3-5 steps with a railing? : None 6 Click Score: 24    End of Session              Time: 1300-1318 PT Time Calculation (min) (ACUTE ONLY): 18 min   Charges:   PT Evaluation $PT Eval Low Complexity: 1 Low         Reinaldo Berber, PT, DPT Acute Rehabilitation Services Pager: [phone] Office: [place] 05/02/2018, 2:23 PM

## 2018-05-02 NOTE — Progress Notes (Addendum)
Daily Rounding Note  05/02/2018, 9:06 AM  LOS: 0 days   SUBJECTIVE:   Chief complaint: anemia, FOBT +   Stools not dark.  No nausea, no abd pain.  Feels better though coughing this AM.   Dr Broadus John planning discharge today.    OBJECTIVE:         Vital signs in last 24 hours:    Temp:  [97.7 F (36.5 C)-99.1 F (37.3 C)] 97.9 F (36.6 C) (12/01 0535) Pulse Rate:  [60-69] 60 (12/01 0828) Resp:  [10-18] 16 (12/01 0535) BP: (154-171)/(54-71) 168/68 (12/01 0828) SpO2:  [93 %-97 %] 93 % (12/01 0535) Weight:  [58.5 kg] 58.5 kg (12/01 0537) Last BM Date: 04/30/18 Filed Weights   04/30/18 0622 05/01/18 0546 05/02/18 0537  Weight: 60.1 kg 60.4 kg 58.5 kg   General: frail, alert, comfortable looking.     Heart: RRR Chest: some rough crackles in left base.  Coughing.   Abdomen: soft, NT, ND.  Active BS.  Extremities: no CCE.   Neuro/Psych:  Pleasant, fully alert and oriented.  No gross deficits.    Intake/Output from previous day: 11/30 0701 - 12/01 0700 In: 2635.8 [P.O.:720; I.V.:60.9; IV Piggyback:1854.9] Out: 400 [Urine:400]  Intake/Output this shift: Total I/O In: 240 [P.O.:240] Out: -   Lab Results: Recent Labs    05/01/18 0603 05/01/18 1933 05/02/18 0326  WBC 11.4* 6.4 6.6  HGB 10.8* 10.3* 10.0*  HCT 34.8* 32.1* 31.6*  PLT 205 163 181   BMET Recent Labs    05/01/18 0603 05/02/18 0326  NA 142 141  K 3.2* 2.8*  CL 104 105  CO2 23 23  GLUCOSE 98 87  BUN 29* 27*  CREATININE 2.78* 2.77*  CALCIUM 9.0 8.6*   PT/INR Recent Labs    04/29/18 1003  LABPROT 14.8  INR 1.17   Scheduled Meds: . amLODipine  5 mg Oral Daily  . atorvastatin  40 mg Oral q1800  . cholecalciferol  1,000 Units Oral Once per day on Mon Thu  . isosorbide mononitrate  30 mg Oral Daily  . latanoprost  1 drop Both Eyes QHS  . levothyroxine  12.5 mcg Oral QAC breakfast  . metoprolol succinate  100 mg Oral Daily  . Netarsudil  Dimesylate  1 drop Both Eyes QHS  . pantoprazole  40 mg Oral BID  . potassium chloride  40 mEq Oral Once  . vitamin B-12  1,000 mcg Oral Daily   Continuous Infusions: . sodium chloride 1,000 mL (05/01/18 0611)  . ampicillin (OMNIPEN) IV 2 g (05/02/18 0655)  . cefTRIAXone (ROCEPHIN)  IV 2 g (05/02/18 0536)   PRN Meds:.sodium chloride, acetaminophen **OR** acetaminophen, acetaminophen-codeine, albuterol, haloperidol lactate, hydrALAZINE, nitroGLYCERIN, ondansetron **OR** ondansetron (ZOFRAN) IV, polyethylene glycol, sodium chloride flush, zolpidem   ASSESMENT:   *  Iron def Anemia, FOBT positive.  S/p 2 U PRBCs.  Hgb stable.  Received Feraheme 11/29.   04/30/18 SB Enteroscopy: Tortuous distal esophagus.  3 cm HH.  Slight friability of duodenum and proximal jejunum.  Distal end of this friability was tattooed to demarcate the extent of the push enteroscopy 05/01/2018 colonoscopy: Nonbleeding mixed hemorrhoids.  Several polyps at the ascending, transverse, descending sigmoid.  Size ranging from 5 to 25 mm.  Only one of the polyps, at the ascending colon, was removed and site clipped.  Nodular mucosa with some scope trauma related bleeding in transverse colon, endoclip placed in the region to decrease risk of  bleeding.  *   S/p TAVR.  Subsequent prosthetic valve enterococcal endocarditis, in the midst of a 6-week course of ampicillin, ceftriaxone.  Chronic A fib.  Status post cardiac pacemaker.  History CAD diastolic heart failure.  Chronic Eliquis.   PLAN   *   Daily PPI, 40 mg Omeprazole or Protonix daily    *   Awaiting polyp path report.  Not clear when/if MD will repeat colonoscopy with polypectomy given pt's frailty and extensive comorbidities.  He would not tolerate post polypectomy bleed, perforation well.      *  Consider repeating Feraheme ~ 05/07/18, would need to be arranged at outpt setting.   *   Discharge today.  GI fup prn, his son-in-law is GI Dr Wilfrid Lund who keeps close  watch over the pt.       Azucena Freed  05/02/2018, 9:06 AM Phone [phone]

## 2018-05-02 NOTE — Progress Notes (Signed)
Page to Joseph,MD 3e16 [place] just FYI Labs Resulted: K+ 3.5

## 2018-05-03 DIAGNOSIS — T826XXA Infection and inflammatory reaction due to cardiac valve prosthesis, initial encounter: Secondary | ICD-10-CM | POA: Diagnosis not present

## 2018-05-04 ENCOUNTER — Telehealth: Payer: Self-pay | Admitting: *Deleted

## 2018-05-04 NOTE — Telephone Encounter (Signed)
He should just go back to what he was doing.  Ampicillin 2 grams every 8 hours with ceftriaxone 2 grams every 12 hours.  Either works fine though.

## 2018-05-04 NOTE — Telephone Encounter (Signed)
Patient was hospitalized, discharged with same iv antibiotics, but written for 24 hour continuous dosing rather than three times a day dosing. Please advise if they need 24 hour dosing or if they can resume as they were prior to latest hospitalization. Previous regimen was : Ampicillin 2 gm Q24h (either q6h push or via pump)  +   Ceftriaxone 2 gm IV Q12h On Discharge, ampicillin is noted as a continuous infusion with ceftriaxone 2 gm q 12 hours. Please advise.  Landis Gandy, RN

## 2018-05-05 NOTE — Telephone Encounter (Signed)
Melissa accessed note via EPIC, relayed information to patient. Thanks!

## 2018-05-07 ENCOUNTER — Other Ambulatory Visit: Payer: Self-pay | Admitting: Adult Health

## 2018-05-07 ENCOUNTER — Encounter: Payer: Self-pay | Admitting: Gastroenterology

## 2018-05-07 DIAGNOSIS — N184 Chronic kidney disease, stage 4 (severe): Secondary | ICD-10-CM

## 2018-05-07 DIAGNOSIS — D631 Anemia in chronic kidney disease: Secondary | ICD-10-CM

## 2018-05-10 ENCOUNTER — Other Ambulatory Visit (INDEPENDENT_AMBULATORY_CARE_PROVIDER_SITE_OTHER): Payer: Medicare Other

## 2018-05-10 DIAGNOSIS — N184 Chronic kidney disease, stage 4 (severe): Secondary | ICD-10-CM | POA: Diagnosis not present

## 2018-05-10 DIAGNOSIS — D631 Anemia in chronic kidney disease: Secondary | ICD-10-CM | POA: Diagnosis not present

## 2018-05-10 LAB — POCT HEMOGLOBIN: Hemoglobin: 11.4 g/dL (ref 9.5–13.5)

## 2018-05-14 ENCOUNTER — Ambulatory Visit: Payer: Medicare Other | Admitting: Cardiology

## 2018-05-14 ENCOUNTER — Other Ambulatory Visit: Payer: Medicare Other

## 2018-05-14 ENCOUNTER — Encounter: Payer: Self-pay | Admitting: Cardiology

## 2018-05-14 VITALS — BP 138/74 | HR 83 | Ht 66.0 in | Wt 133.4 lb

## 2018-05-14 DIAGNOSIS — T826XXD Infection and inflammatory reaction due to cardiac valve prosthesis, subsequent encounter: Secondary | ICD-10-CM

## 2018-05-14 DIAGNOSIS — I5043 Acute on chronic combined systolic (congestive) and diastolic (congestive) heart failure: Secondary | ICD-10-CM

## 2018-05-14 DIAGNOSIS — I480 Paroxysmal atrial fibrillation: Secondary | ICD-10-CM

## 2018-05-14 DIAGNOSIS — I2510 Atherosclerotic heart disease of native coronary artery without angina pectoris: Secondary | ICD-10-CM

## 2018-05-14 DIAGNOSIS — I38 Endocarditis, valve unspecified: Secondary | ICD-10-CM

## 2018-05-14 MED ORDER — ISOSORBIDE MONONITRATE ER 60 MG PO TB24: 60.0000 mg | ORAL_TABLET | Freq: Every day | ORAL | 3 refills | Status: DC

## 2018-05-14 MED ORDER — AMLODIPINE BESYLATE 5 MG PO TABS: 5.0000 mg | ORAL_TABLET | Freq: Every day | ORAL | 3 refills | Status: DC

## 2018-05-14 NOTE — Patient Instructions (Signed)
Medication Instructions:  Your physician recommends that you continue on your current medications as directed. Please refer to the Current Medication list given to you today.  If you need a refill on your cardiac medications before your next appointment, please call your pharmacy.   Lab work: None Ordered  If you have labs (blood work) drawn today and your tests are completely normal, you will receive your results only by: Marland Kitchen MyChart Message (if you have MyChart) OR . A paper copy in the mail If you have any lab test that is abnormal or we need to change your treatment, we will call you to review the results.  Testing/Procedures: None ordered  Follow-Up: . Follow up with Dr. Meda Coffee on 06/17/18 at 10:20 AM  Any Other Special Instructions Will Be Listed Below (If Applicable).

## 2018-05-14 NOTE — Progress Notes (Signed)
Cardiology Office Note    Date:  05/14/2018  ID:  Ryan [name], DOB Nov 14, 1929, MRN [id number] PCP:  Dorothyann Peng, NP  Cardiologist:  Ena Dawley, MD   Chief Complaint: Follow up for prosthetic aortic valve endocarditis  History of Present Illness:  Ryan [name] a very pleasant7 year old Caucasian male with past medical history significant for carotid artery disease status post bilateral CEA, CAD status post PTCA/DES to RCA 04/2016, aortic stenosis status post TAVR 06/2016, HTN, HLD, prior SVT, OSA, chronic combined CHF, cardiomyopathy status post PPM, paroxysmal atrial fibrillation on Eliquis who presented to ED on 11/07/2017 with progressive left lower back pain of 3 days duration not relieved by pain medications.Patient was 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 He was doing better until 3 days prior to admission when he developed worsening left low back pain and profound fatigue.    He was admitted on November 07, 2017 and diagnosed with eterococcal bacteremia withprosthetic valve endocarditis.  There was a large vegetation seen on bioprosthetic aortic valve, no vegetation was seen on pacemaker leads. Patient has been on IV antibiotics IV ampicillin and Rocephin for the endocarditis.  Patient was discharged to skilled nursing facility to complete course of antibiotics, completed on December 22, 2017 During the hospital stay, the patient developed an acute kidney injury believed to be secondary to dehydration and possible endocarditis embolization. Patient's intractable back pain was further evaluated, CT showed spondylosis, mild to moderate multilevel foraminal stenosis, pars defect L 4-5.   Neurosurgery reassessed on 6/11 and indicatedthat his acute onset of back pain is due to 1 of 3 possibilities: Exacerbation of chronic lumbar spondylosis, spontaneous spinal EDH related to Eliquis or discitis. He  recommends nonoperative treatment, continue antibiotics for his bacteremia and if stroke risk felt to be high without Eliquis then consider resuming it.   01/27/2018 -the patient was readmitted on January 15, 2018 for complaint of like of energy and chest pain with elevated troponin.  Coronary CTA on August 16 showed widely patent mid RCA stent the remainder of anatomy and change, the etiology of his chest pain is not supported by any change in his coronary anatomy and medical therapy was recommended.  He was treated for acute combined systolic and diastolic heart failure.  Discharged on August 20 has 2019. Today he states that he has more energy, he finally feels little better, alternates exercises with naps during the day, denies any chest pain other than heartburn at night, no lower extremity edema, no orthopnea, paroxmal nocturnal dyspnea no fevers.  04/20/2018 -patient was readmitted on October 28 with recurrent fevers, he has been started on IV ampicillin and Rocephin, his leukocytosis was improved and he appears nontoxic, TEE did not show any recurrent aortic valve endocarditis.  He is blood cultures are negative.  He received PICC line to his right arm on April 01, 2018 and will receive IV penicillin with Rocephin for 6 weeks.  He is supposed to follow with ID service as well.  May 14, 2018 -the patient is coming after 4 weeks, he has been doing well he completed his 6-week course of  IV antibiotics and is on lifelong therapy with amoxicillin.  He is also better than he has felt in a long time he denies any fever or chills, no palpitations chest pain, no lower extremity edema orthopnea or paroxysmal nocturnal dyspnea.  No bleeding.  Past Medical History:  Diagnosis Date  .  Age-related macular degeneration   . Allergic rhinitis   . 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 Anemia 12/2017   normocytic.  iron deficient, ferritin 17 04/2018  . Arthritis    "hands, lower back" (04/30/2016)   . Asthma   . Bacterial endocarditis   . Carotid artery obstruction    a. s/p bilat CEA.  . Chronic combined systolic (congestive) and diastolic (congestive) heart failure ([place]) 03/18/2016  . Chronic lower back pain   . CKD (chronic kidney disease) stage 4, GFR 15-29 ml/min (HCC) 12/2017  . Claudication in peripheral vascular disease ([place]) 01/01/2018   Claudication  . Coronary arteriosclerosis in native artery    a. 2017 Cardiac catheterization demonstrated worsening CAD with 70% mid LCx, 80% OM1 and 80% mid RCA stenosis. b.  In 11/17, he underwent successful rotational atherectomy and DES to RCA.  Marland Kitchen Decreased diffusion capacity 10/23/2017  . Diverticulitis of colon   . DJD (degenerative joint disease)   . Elevated TSH 06/30/2017  . GERD (gastroesophageal reflux disease)   . History of hiatal hernia   . HLD (hyperlipidemia)   . Hypertension   . Hypertensive heart disease with heart failure ([place])   . Intractable back pain 11/07/2017  . OSA (obstructive sleep apnea)    intolerant to CPAP  . PAF (paroxysmal atrial fibrillation) ([place])   . Paroxysmal supraventricular tachycardia ([place])   . Presence of permanent cardiac pacemaker   . Primary malignant neoplasm of bladder ([place])   . Prostate cancer ([place])   . 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 S/P AVR 12/02/2017  . Syncope 10/23/2017  . Tachy-brady syndrome ([place])   . Thrombocytopenia ([place]) 11/07/2017  . Urinary retention 11/07/2017    Past Surgical History:  Procedure Laterality Date  . APPENDECTOMY    . CARDIAC CATHETERIZATION N/A 01/17/2016   Procedure: Right/Left Heart Cath and Coronary Angiography;  Surgeon: Belva Crome, MD;  Location: [facility];  Service: Cardiovascular;  Laterality: N/A;  . CARDIAC CATHETERIZATION N/A 04/30/2016   Procedure: Coronary/Graft Atherectomy;  Surgeon: Sherren Mocha, MD;  Location: [facility];  Service: Cardiovascular;  Laterality: N/A;  . CAROTID  ENDARTERECTOMY Bilateral   . CATARACT EXTRACTION W/ INTRAOCULAR LENS  IMPLANT, BILATERAL Bilateral   . COLON SURGERY  1981   Meckles Diverticulum with volvulus  . COLONOSCOPY WITH PROPOFOL N/A 05/01/2018   Procedure: COLONOSCOPY WITH PROPOFOL;  Surgeon: Rush Landmark Telford Nab., MD;  Location: [facility];  Service: Gastroenterology;  Laterality: N/A;  . CORONARY ANGIOGRAPHY N/A 01/18/2018   Procedure: CORONARY ANGIOGRAPHY;  Surgeon: Lorretta Harp, MD;  Location: [facility];  Service: Cardiovascular;  Laterality: N/A;  . ENTEROSCOPY N/A 04/30/2018   Procedure: ENTEROSCOPY;  Surgeon: Rush Landmark Telford Nab., MD;  Location: [facility];  Service: Gastroenterology;  Laterality: N/A;  . EP IMPLANTABLE DEVICE N/A 03/21/2016   Procedure: Pacemaker Implant;  Surgeon: Evans Lance, MD;  Location: [facility];  Service: Cardiovascular;  Laterality: N/A;  . EYE SURGERY    . INGUINAL HERNIA REPAIR Right 2009  . INSERT / REPLACE / REMOVE PACEMAKER    . INSERTION PROSTATE RADIATION SEED    . POLYPECTOMY  05/01/2018   Procedure: POLYPECTOMY;  Surgeon: Mansouraty, Telford Nab., MD;  Location: [facility];  Service: Gastroenterology;;  . TEE WITHOUT CARDIOVERSION N/A 06/10/2016   Procedure: TRANSESOPHAGEAL ECHOCARDIOGRAM (TEE);  Surgeon: Sherren Mocha, MD;  Location: [facility];  Service: Open Heart Surgery;  Laterality: N/A;  .  TEE WITHOUT CARDIOVERSION N/A 11/10/2017   Procedure: TRANSESOPHAGEAL ECHOCARDIOGRAM (TEE);  Surgeon: Acie Fredrickson Wonda Cheng, MD;  Location: [facility];  Service: Cardiovascular;  Laterality: N/A;  . TEE WITHOUT CARDIOVERSION N/A 12/15/2017   Procedure: TRANSESOPHAGEAL ECHOCARDIOGRAM (TEE);  Surgeon: Buford Dresser, MD;  Location: [facility] END[facility]COPY;  Service: Cardiovascular;  Laterality: N/A;  . TEE WITHOUT CARDIOVERSION N/A 03/30/2018   Procedure: TRANSESOPHAGEAL ECHOCARDIOGRAM (TEE);  Surgeon: Jerline Pain, MD;  Location[facility];  Service: Cardiovascular;   Laterality: N/A;  . TONSILLECTOMY  ~ 1947  . TRANSCATHETER AORTIC VALVE REPLACEMENT, TRANSFEMORAL N/A 06/10/2016   Procedure: TRANSCATHETER AORTIC VALVE REPLACEMENT, TRANSFEMORAL;  Surgeon: Sherren Mocha, MD;  Location: [facility];  Service: Open Heart Surgery;  Laterality: N/A;    Current Medications: Current Meds  Medication Sig  . acetaminophen-codeine (TYLENOL #3) 300-30 MG tablet Take 0.5-1 tablets by mouth every 4 (four) hours as needed for moderate pain.  Marland Kitchen amLODipine (NORVASC) 5 MG tablet Take 1 tablet (5 mg total) by mouth daily.  Marland Kitchen amoxicillin (AMOXIL) 500 MG tablet Take 1 tablet (500 mg total) by mouth 2 (two) times daily.  Marland Kitchen apixaban (ELIQUIS) 2.5 MG TABS tablet Take 1 tablet (2.5 mg total) by mouth 2 (two) times daily.  Marland Kitchen atorvastatin (LIPITOR) 40 MG tablet Take 1 tablet (40 mg total) by mouth daily at 6 PM.  . cholecalciferol (VITAMIN D) 1000 units tablet Take 1,000 Units by mouth 2 (two) times a week.   . furosemide (LASIX) 40 MG tablet Take 1 tablet (40 mg total) by mouth daily.  . isosorbide mononitrate (IMDUR) 60 MG 24 hr tablet Take 1 tablet (60 mg total) by mouth daily.  Marland Kitchen latanoprost (XALATAN) 0.005 % ophthalmic solution Place 1 drop into both eyes at bedtime.  Marland Kitchen levothyroxine (SYNTHROID, LEVOTHROID) 25 MCG tablet Take 0.5 tablets (12.5 mcg total) by mouth daily before breakfast.  . metoprolol succinate (TOPROL-XL) 100 MG 24 hr tablet Take 1 tablet (100 mg total) by mouth daily. Take with or immediately following a meal.  . Netarsudil Dimesylate (RHOPRESSA) 0.02 % SOLN Place 1 drop into both eyes at bedtime.  . nitroGLYCERIN (NITROSTAT) 0.4 MG SL tablet Place 1 tablet (0.4 mg total) under the tongue every 5 (five) minutes x 3 doses as needed for chest pain.  . pantoprazole (PROTONIX) 40 MG tablet Take 1 tablet (40 mg total) by mouth daily.  . polyethylene glycol (MIRALAX / GLYCOLAX) packet Take 17 g by mouth daily.  . potassium chloride SA (K-DUR,KLOR-CON) 20 MEQ tablet Take 2  tablets (40 mEq total) by mouth daily. With daily lasix  . vitamin B-12 (CYANOCOBALAMIN) 1000 MCG tablet Take 1,000 mcg by mouth daily.    Allergies:   Ciprofloxacin; Hydrochlorothiazide; Sulfa antibiotics; Ace inhibitors; Losartan; Carvedilol; Lisinopril; and Soy allergy   Social History   Socioeconomic History  . Marital status: Married    Spouse name: Not on file  . Number of children: Not on file  . Years of education: Not on file  . Highest education level: Not on file  Occupational History  . Occupation: retired  Scientific laboratory technician  . Financial resource strain: Not on file  . Food insecurity:    Worry: Not on file    Inability: Not on file  . Transportation needs:    Medical: Not on file    Non-medical: Not on file  Tobacco Use  . Smoking status: Never Smoker  . Smokeless tobacco: Never Used  Substance and Sexual Activity  . Alcohol use: No  Alcohol/week: 0.0 standard drinks    Comment: 04/30/2016 "nothing in years"  . Drug use: No  . Sexual activity: Never  Lifestyle  . Physical activity:    Days per week: Not on file    Minutes per session: Not on file  . Stress: Not on file  Relationships  . Social connections:    Talks on phone: Not on file    Gets together: Not on file    Attends religious service: Not on file    Active member of club or organization: Not on file    Attends meetings of clubs or organizations: Not on file    Relationship status: Not on file  Other Topics Concern  . Not on file  Social History Narrative   Retired    Three children - One in [place], One in [place], and one in [place].        Family History:  The patient's family history includes Heart disease in his father and mother.  ROS:   Please see the history of present illness.  All other systems are reviewed and otherwise negative.    PHYSICAL EXAM:   VS:  BP 138/74   Pulse 83   Ht 5\' 6"  (1.676 m)   Wt 133 lb 6.4 oz (60.5 kg)   SpO2 98%   BMI 21.53 kg/m   BMI: Body mass  index is 21.53 kg/m. GEN: Well nourished, well developed chronically ill appearing WM, in no acute distress HEENT: normocephalic, atraumatic Neck: no JVD, carotid bruits, or masses Cardiac: RRR; no murmurs, rubs, or gallops, bilateral 1+ pitting edema left more than right, PICC line in the right arm Respiratory: Crackles at bases bilaterally GI: soft, nontender, nondistended, + BS MS: no deformity or atrophy Skin: warm and dry, no rash Neuro:  Alert and Oriented x 3, Strength and sensation are intact, follows commands. reporst generalized R eye blurriness that comes and goes Psych: euthymic mood, full affect  Wt Readings from Last 3 Encounters:  05/14/18 133 lb 6.4 oz (60.5 kg)  05/02/18 128 lb 14.4 oz (58.5 kg)  04/22/18 135 lb (61.2 kg)    Studies/Labs Reviewed:   EKG:  EKG was ordered today and personally reviewed, it shows atrial paced rhythm with left bundle branch block.  Recent Labs: 11/07/2017: B Natriuretic Peptide 219.3 01/14/2018: NT-Pro BNP 3,168; TSH 3.390 03/31/2018: Magnesium 2.1 04/28/2018: ALT 11 05/02/2018: BUN 28; Creatinine, Ser 2.64; Platelets 181; Potassium 3.5; Sodium 141 05/10/2018: Hemoglobin 11.4   Lipid Panel    Component Value Date/Time   CHOL 130 05/01/2017 0827   TRIG 70.0 05/01/2017 0827   HDL 56.00 05/01/2017 0827   CHOLHDL 2 05/01/2017 0827   VLDL 14.0 05/01/2017 0827   LDLCALC 60 05/01/2017 0827    Additional studies/ records that were reviewed today include: Summarized above  ASSESSMENT & PLAN:   1. Bioprosthetic aortic valve endocarditis -diagnosed on November 13, 2017, completed 6 weeks course of IV antibiotics and on lifelong.  Ampicillin.  He has no recurrent fever, no evidence of endocarditis on TEE on March 30, 2018.  2. CAD -status post cath on December 15, 2017 with stable findings as above, medical therapy is indicated.  3.  Paroxysmal atrial fibrillation, he is maintaining sinus rhythm, on Eliquis and no bleeding.  Most recent  hemoglobin on May 10, 2018 was 11.4.  4.  Acute on chronic combined CHF with cardiomyopathy - LV function has declined over the last several years.  We added Lasix 40 mg daily  at the last visit with improvement of symptoms and loss of 4 pounds.  His creatinine of 2.6 from previous 3.2.  I will continue Lasix.  5. Sick sinus syndrome, status post pacemaker placement, working normally, followed by Dr. Lovena Le.  Disposition: Follow-up with me in 3 weeks on January 16 at 10:20 AM.  Medication Adjustments/Labs and Tests Ordered: Current medicines are reviewed at length with the patient today.  Concerns regarding medicines are outlined above. Medication changes, Labs and Tests ordered today are summarized above and listed in the Patient Instructions accessible in Encounters.   Signed, Ena Dawley, MD  05/14/2018 12:04 PM    [address] [place], [place], [place]  [address] Phone: [phone]; Fax: [phone]

## 2018-05-17 ENCOUNTER — Encounter: Payer: Self-pay | Admitting: Internal Medicine

## 2018-05-17 ENCOUNTER — Ambulatory Visit: Payer: Medicare Other | Admitting: Internal Medicine

## 2018-05-17 VITALS — BP 176/76 | HR 81 | Temp 97.6°F | Wt 134.0 lb

## 2018-05-17 DIAGNOSIS — Z950 Presence of cardiac pacemaker: Secondary | ICD-10-CM

## 2018-05-17 DIAGNOSIS — R7881 Bacteremia: Secondary | ICD-10-CM | POA: Diagnosis not present

## 2018-05-17 DIAGNOSIS — B952 Enterococcus as the cause of diseases classified elsewhere: Secondary | ICD-10-CM | POA: Diagnosis not present

## 2018-05-17 NOTE — Assessment & Plan Note (Signed)
Will keep him on suppressive amoxcillin

## 2018-05-17 NOTE — Assessment & Plan Note (Signed)
Doing well and will recheck blood cultures today to be sure no further growth  He will return in 1 year for a yearly check on amoxicillin.

## 2018-05-17 NOTE — Progress Notes (Signed)
   Subjective:    Patient ID: SAKSHAM AKKERMAN, male    DOB: 24-Oct-1929, 84 y.o.   MRN: [id number]  HPI He is here for follow-up of his recent hospitalization. He came back to the hospital again with fever and found enterococcal bacteremia again.  Fortunately his TEE was negative for vegetation with his history of prosthetic valve placement and pacemaker.  He now has completed 6 weeks of dual beta-lactam therapy with ceftriaxone and ampicillin and did well with this.  Now on amoxicillin 500 mg twice a day indefinitely.  No recent fever or chills.  No associated rash or diarrhea.     Review of Systems  Constitutional: Negative for chills, fatigue and fever.  Gastrointestinal: Negative for diarrhea and nausea.  Skin: Negative for rash.       Objective:   Physical Exam Constitutional:      General: He is not in acute distress.    Appearance: He is well-developed.  Eyes:     General: No scleral icterus. Cardiovascular:     Rate and Rhythm: Normal rate and regular rhythm.     Heart sounds: Normal heart sounds.  Pulmonary:     Effort: Pulmonary effort is normal. No respiratory distress.     Breath sounds: Normal breath sounds.  Skin:    Findings: No rash.         Assessment & Plan:

## 2018-05-25 LAB — CULTURE, BLOOD (SINGLE)
MICRO NUMBER:: 91503326
MICRO NUMBER:: 91503328
Result:: NO GROWTH
Result:: NO GROWTH
SPECIMEN QUALITY: ADEQUATE
SPECIMEN QUALITY:: ADEQUATE

## 2018-05-28 DIAGNOSIS — H26493 Other secondary cataract, bilateral: Secondary | ICD-10-CM | POA: Diagnosis not present

## 2018-05-28 DIAGNOSIS — H401213 Low-tension glaucoma, right eye, severe stage: Secondary | ICD-10-CM | POA: Diagnosis not present

## 2018-05-28 DIAGNOSIS — H401222 Low-tension glaucoma, left eye, moderate stage: Secondary | ICD-10-CM | POA: Diagnosis not present

## 2018-05-28 DIAGNOSIS — H18003 Unspecified corneal deposit, bilateral: Secondary | ICD-10-CM | POA: Diagnosis not present

## 2018-06-03 ENCOUNTER — Other Ambulatory Visit: Payer: Self-pay | Admitting: Adult Health

## 2018-06-03 DIAGNOSIS — D649 Anemia, unspecified: Secondary | ICD-10-CM

## 2018-06-04 LAB — CUP PACEART REMOTE DEVICE CHECK
Battery Remaining Longevity: 134 mo
Battery Voltage: 2.8 V
Brady Statistic AP VP Percent: 0 %
Brady Statistic AP VS Percent: 96 %
Brady Statistic AS VP Percent: 0 %
Brady Statistic AS VS Percent: 3 %
Date Time Interrogation Session: 20191111151309
Implantable Lead Implant Date: 20171020
Implantable Lead Location: 753859
Implantable Lead Location: 753860
Implantable Lead Model: 5076
Implantable Lead Model: 5076
Implantable Pulse Generator Implant Date: 20171020
Lead Channel Impedance Value: 528 Ohm
Lead Channel Pacing Threshold Amplitude: 0.5 V
Lead Channel Pacing Threshold Amplitude: 0.75 V
Lead Channel Pacing Threshold Pulse Width: 0.4 ms
Lead Channel Pacing Threshold Pulse Width: 0.4 ms
Lead Channel Setting Pacing Amplitude: 2 V
Lead Channel Setting Pacing Amplitude: 2.5 V
Lead Channel Setting Pacing Pulse Width: 0.4 ms
Lead Channel Setting Sensing Sensitivity: 4 mV
MDC IDC LEAD IMPLANT DT: 20171020
MDC IDC MSMT BATTERY IMPEDANCE: 162 Ohm
MDC IDC MSMT LEADCHNL RA IMPEDANCE VALUE: 423 Ohm

## 2018-06-08 ENCOUNTER — Other Ambulatory Visit (INDEPENDENT_AMBULATORY_CARE_PROVIDER_SITE_OTHER): Payer: Medicare Other

## 2018-06-08 DIAGNOSIS — D649 Anemia, unspecified: Secondary | ICD-10-CM | POA: Diagnosis not present

## 2018-06-08 LAB — BASIC METABOLIC PANEL
BUN: 66 mg/dL — ABNORMAL HIGH (ref 6–23)
CO2: 25 mEq/L (ref 19–32)
Calcium: 9.6 mg/dL (ref 8.4–10.5)
Chloride: 102 mEq/L (ref 96–112)
Creatinine, Ser: 2.81 mg/dL — ABNORMAL HIGH (ref 0.40–1.50)
GFR: 22.85 mL/min — AB (ref >60.00)
Glucose, Bld: 90 mg/dL (ref 70–99)
Potassium: 4 mEq/L (ref 3.5–5.1)
Sodium: 138 mEq/L (ref 135–145)

## 2018-06-08 LAB — CBC WITH DIFFERENTIAL/PLATELET
Basophils Absolute: 0.1 10*3/uL (ref 0.0–0.1)
Basophils Relative: 1.4 % (ref 0.0–3.0)
Eosinophils Absolute: 0.1 10*3/uL (ref 0.0–0.7)
Eosinophils Relative: 2.5 % (ref 0.0–5.0)
HCT: 38.2 % — ABNORMAL LOW (ref 39.0–52.0)
Hemoglobin: 12.7 g/dL — ABNORMAL LOW (ref 13.0–17.0)
Lymphocytes Relative: 13.1 % (ref 12.0–46.0)
Lymphs Abs: 0.8 10*3/uL (ref 0.7–4.0)
MCHC: 33.2 g/dL (ref 30.0–36.0)
MCV: 92.4 fl (ref 78.0–100.0)
Monocytes Absolute: 0.7 10*3/uL (ref 0.1–1.0)
Monocytes Relative: 11.8 % (ref 3.0–12.0)
Neutro Abs: 4.2 10*3/uL (ref 1.4–7.7)
Neutrophils Relative %: 71.2 % (ref 43.0–77.0)
Platelets: 159 10*3/uL (ref 150.0–400.0)
RBC: 4.13 Mil/uL — AB (ref 4.22–5.81)
RDW: 17.4 % — ABNORMAL HIGH (ref 11.5–15.5)
WBC: 5.8 10*3/uL (ref 4.0–10.5)

## 2018-06-10 LAB — IRON,TIBC AND FERRITIN PANEL
%SAT: 35 % (calc) (ref 20–48)
Ferritin: 190 ng/mL (ref 24–380)
IRON: 105 ug/dL (ref 50–180)
TIBC: 303 ug/dL (ref 250–425)

## 2018-06-10 LAB — ERYTHROPOIETIN: ERYTHROPOIETIN: 9 m[IU]/mL (ref 2.6–18.5)

## 2018-06-14 DIAGNOSIS — H401213 Low-tension glaucoma, right eye, severe stage: Secondary | ICD-10-CM | POA: Diagnosis not present

## 2018-06-14 DIAGNOSIS — H26493 Other secondary cataract, bilateral: Secondary | ICD-10-CM | POA: Diagnosis not present

## 2018-06-14 DIAGNOSIS — H353131 Nonexudative age-related macular degeneration, bilateral, early dry stage: Secondary | ICD-10-CM | POA: Diagnosis not present

## 2018-06-14 DIAGNOSIS — H18003 Unspecified corneal deposit, bilateral: Secondary | ICD-10-CM | POA: Diagnosis not present

## 2018-06-17 ENCOUNTER — Encounter: Payer: Self-pay | Admitting: Cardiology

## 2018-06-17 ENCOUNTER — Ambulatory Visit: Payer: Medicare Other | Admitting: Cardiology

## 2018-06-17 ENCOUNTER — Encounter (INDEPENDENT_AMBULATORY_CARE_PROVIDER_SITE_OTHER): Payer: Self-pay

## 2018-06-17 VITALS — BP 146/80 | HR 88 | Ht 66.0 in | Wt 130.0 lb

## 2018-06-17 DIAGNOSIS — I2510 Atherosclerotic heart disease of native coronary artery without angina pectoris: Secondary | ICD-10-CM

## 2018-06-17 DIAGNOSIS — T826XXD Infection and inflammatory reaction due to cardiac valve prosthesis, subsequent encounter: Secondary | ICD-10-CM

## 2018-06-17 DIAGNOSIS — I5043 Acute on chronic combined systolic (congestive) and diastolic (congestive) heart failure: Secondary | ICD-10-CM

## 2018-06-17 DIAGNOSIS — I480 Paroxysmal atrial fibrillation: Secondary | ICD-10-CM

## 2018-06-17 DIAGNOSIS — I38 Endocarditis, valve unspecified: Secondary | ICD-10-CM

## 2018-06-17 DIAGNOSIS — Z952 Presence of prosthetic heart valve: Secondary | ICD-10-CM

## 2018-06-17 NOTE — Progress Notes (Signed)
Cardiology Office Note    Date:  06/17/2018  ID:  Ryan [name], DOB 1930/04/22, MRN [id number] PCP:  Dorothyann Peng, NP  Cardiologist:  Ena Dawley, MD   Chief Complaint: Follow up for prosthetic aortic valve endocarditis  History of Present Illness:  Ryan [name] a very pleasant10 year old Caucasian male with past medical history significant for carotid artery disease status post bilateral CEA, CAD status post PTCA/DES to RCA 04/2016, aortic stenosis status post TAVR 06/2016, HTN, HLD, prior SVT, OSA, chronic combined CHF, cardiomyopathy status post PPM, paroxysmal atrial fibrillation on Eliquis who presented to ED on 11/07/2017 with progressive left lower back pain of 3 days duration not relieved by pain medications.Patient was 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 He was doing better until 3 days prior to admission when he developed worsening left low back pain and profound fatigue.    He was admitted on November 07, 2017 and diagnosed with eterococcal bacteremia withprosthetic valve endocarditis.  There was a large vegetation seen on bioprosthetic aortic valve, no vegetation was seen on pacemaker leads. Patient has been on IV antibiotics IV ampicillin and Rocephin for the endocarditis.  Patient was discharged to skilled nursing facility to complete course of antibiotics, completed on December 22, 2017 During the hospital stay, the patient developed an acute kidney injury believed to be secondary to dehydration and possible endocarditis embolization. Patient's intractable back pain was further evaluated, CT showed spondylosis, mild to moderate multilevel foraminal stenosis, pars defect L 4-5.   Neurosurgery reassessed on 6/11 and indicatedthat his acute onset of back pain is due to 1 of 3 possibilities: Exacerbation of chronic lumbar spondylosis, spontaneous spinal EDH related to Eliquis or discitis. He  recommends nonoperative treatment, continue antibiotics for his bacteremia and if stroke risk felt to be high without Eliquis then consider resuming it.   01/27/2018 -the patient was readmitted on January 15, 2018 for complaint of like of energy and chest pain with elevated troponin.  Coronary CTA on August 16 showed widely patent mid RCA stent the remainder of anatomy and change, the etiology of his chest pain is not supported by any change in his coronary anatomy and medical therapy was recommended.  He was treated for acute combined systolic and diastolic heart failure.  Discharged on August 20 has 2019. Today he states that he has more energy, he finally feels little better, alternates exercises with naps during the day, denies any chest pain other than heartburn at night, no lower extremity edema, no orthopnea, paroxmal nocturnal dyspnea no fevers.  04/20/2018 -patient was readmitted on October 28 with recurrent fevers, he has been started on IV ampicillin and Rocephin, his leukocytosis was improved and he appears nontoxic, TEE did not show any recurrent aortic valve endocarditis.  He is blood cultures are negative.  He received PICC line to his right arm on April 01, 2018 and will receive IV penicillin with Rocephin for 6 weeks.  He is supposed to follow with ID service as well.  May 14, 2018 -the patient is coming after 4 weeks, he has been doing well he completed his 6-week course of  IV antibiotics and is on lifelong therapy with amoxicillin.  He is also better than he has felt in a long time he denies any fever or chills, no palpitations chest pain, no lower extremity edema orthopnea or paroxysmal nocturnal dyspnea.  No bleeding.  06/17/2018 -this is 4 weeks follow-up the patient  feels and looks great, he states he feels tired however he still able to perform 1 hour and stationary bike, use rowing machine and walk for about 20 minutes a day.  He has a lot of interruptions and he sleeps but  takes naps during the day so overall he gets 8 hours of sleep.  He denies any fever or chills.  He now uses Lasix only occasionally as needed as his lower extremity edema has resolved.  He use compression socks.  He denies any chest pain and overall just feels really well right now.   Past Medical History:  Diagnosis Date  . Age-related macular degeneration   . Allergic rhinitis   . 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 Anemia 12/2017   normocytic.  iron deficient, ferritin 17 04/2018  . Arthritis    "hands, lower back" (04/30/2016)  . Asthma   . Bacterial endocarditis   . Carotid artery obstruction    a. s/p bilat CEA.  . Chronic combined systolic (congestive) and diastolic (congestive) heart failure ([place]) 03/18/2016  . Chronic lower back pain   . CKD (chronic kidney disease) stage 4, GFR 15-29 ml/min (HCC) 12/2017  . Claudication in peripheral vascular disease ([place]) 01/01/2018   Claudication  . Coronary arteriosclerosis in native artery    a. 2017 Cardiac catheterization demonstrated worsening CAD with 70% mid LCx, 80% OM1 and 80% mid RCA stenosis. b.  In 11/17, he underwent successful rotational atherectomy and DES to RCA.  Marland Kitchen Decreased diffusion capacity 10/23/2017  . Diverticulitis of colon   . DJD (degenerative joint disease)   . Elevated TSH 06/30/2017  . GERD (gastroesophageal reflux disease)   . History of hiatal hernia   . HLD (hyperlipidemia)   . Hypertension   . Hypertensive heart disease with heart failure ([place])   . Intractable back pain 11/07/2017  . OSA (obstructive sleep apnea)    intolerant to CPAP  . PAF (paroxysmal atrial fibrillation) ([place])   . Paroxysmal supraventricular tachycardia ([place])   . Presence of permanent cardiac pacemaker   . Primary malignant neoplasm of bladder ([place])   . Prostate cancer ([place])   . 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 S/P AVR 12/02/2017  . Syncope 10/23/2017  . Tachy-brady syndrome ([place])   .  Thrombocytopenia ([place]) 11/07/2017  . Urinary retention 11/07/2017    Past Surgical History:  Procedure Laterality Date  . APPENDECTOMY    . CARDIAC CATHETERIZATION N/A 01/17/2016   Procedure: Right/Left Heart Cath and Coronary Angiography;  Surgeon: Belva Crome, MD;  Location: [place] [facility];  Service: Cardiovascular;  Laterality: N/A;  . CARDIAC CATHETERIZATION N/A 04/30/2016   Procedure: Coronary/Graft Atherectomy;  Surgeon: Sherren Mocha, MD;  Location: [place] [facility];  Service: Cardiovascular;  Laterality: N/A;  . CAROTID ENDARTERECTOMY Bilateral   . CATARACT EXTRACTION W/ INTRAOCULAR LENS  IMPLANT, BILATERAL Bilateral   . COLON SURGERY  1981   Meckles Diverticulum with volvulus  . COLONOSCOPY WITH PROPOFOL N/A 05/01/2018   Procedure: COLONOSCOPY WITH PROPOFOL;  Surgeon: Rush Landmark Telford Nab., MD;  Location: [facility];  Service: Gastroenterology;  Laterality: N/A;  . CORONARY ANGIOGRAPHY N/A 01/18/2018   Procedure: CORONARY ANGIOGRAPHY;  Surgeon: Lorretta Harp, MD;  Location: [facility];  Service: Cardiovascular;  Laterality: N/A;  . ENTEROSCOPY N/A 04/30/2018   Procedure: ENTEROSCOPY;  Surgeon: Rush Landmark Telford Nab., MD;  Location: [facility];  Service: Gastroenterology;  Laterality: N/A;  . EP IMPLANTABLE DEVICE N/A 03/21/2016  Procedure: Pacemaker Implant;  Surgeon: Evans Lance, MD;  Location: [facility];  Service: Cardiovascular;  Laterality: N/A;  . EYE SURGERY    . INGUINAL HERNIA REPAIR Right 2009  . INSERT / REPLACE / REMOVE PACEMAKER    . INSERTION PROSTATE RADIATION SEED    . POLYPECTOMY  05/01/2018   Procedure: POLYPECTOMY;  Surgeon: Mansouraty, Telford Nab., MD;  Location: [facility];  Service: Gastroenterology;;  . TEE WITHOUT CARDIOVERSION N/A 06/10/2016   Procedure: TRANSESOPHAGEAL ECHOCARDIOGRAM (TEE);  Surgeon: Sherren Mocha, MD;  Location: [facility];  Service: Open Heart Surgery;  Laterality: N/A;  . TEE WITHOUT CARDIOVERSION N/A  11/10/2017   Procedure: TRANSESOPHAGEAL ECHOCARDIOGRAM (TEE);  Surgeon: Acie Fredrickson Wonda Cheng, MD;  Location[facility] END[facility];  Service: Cardiovascular;  Laterality: N/A;  . TEE WITHOUT CARDIOVERSION N/A 12/15/2017   Procedure: TRANSESOPHAGEAL ECHOCARDIOGRAM (TEE);  Surgeon: Buford Dresser, MD;  Location: [facility] ENDOSCOPY;  Service: Cardiovascular;  Laterality: N/A;  . TEE WITHOUT CARDIOVERSION N/A 03/30/2018   Procedure: TRANSESOPHAGEAL ECHOCARDIOGRAM (TEE);  Surgeon: Jerline Pain, MD;  Location: [facility] ENDOSCOPY;  Service: Cardiovascular;  Laterality: N/A;  . TONSILLECTOMY  ~ 1947  . TRANSCATHETER AORTIC VALVE REPLACEMENT, TRANSFEMORAL N/A 06/10/2016   Procedure: TRANSCATHETER AORTIC VALVE REPLACEMENT, TRANSFEMORAL;  Surgeon: Sherren Mocha, MD;  Location: [place];  Service: Open Heart Surgery;  Laterality: N/A;    Current Medications: Current Meds  Medication Sig  . acetaminophen-codeine (TYLENOL #3) 300-30 MG tablet Take 0.5-1 tablets by mouth every 4 (four) hours as needed for moderate pain.  Marland Kitchen amLODipine (NORVASC) 5 MG tablet Take 1 tablet (5 mg total) by mouth daily.  Marland Kitchen amoxicillin (AMOXIL) 500 MG tablet Take 1 tablet (500 mg total) by mouth 2 (two) times daily.  Marland Kitchen apixaban (ELIQUIS) 2.5 MG TABS tablet Take 1 tablet (2.5 mg total) by mouth 2 (two) times daily.  Marland Kitchen atorvastatin (LIPITOR) 40 MG tablet Take 1 tablet (40 mg total) by mouth daily at 6 PM.  . cholecalciferol (VITAMIN D) 1000 units tablet Take 1,000 Units by mouth 2 (two) times a week.   . furosemide (LASIX) 40 MG tablet Take 1 tablet (40 mg total) by mouth daily.  . isosorbide mononitrate (IMDUR) 60 MG 24 hr tablet Take 1 tablet (60 mg total) by mouth daily.  Marland Kitchen latanoprost (XALATAN) 0.005 % ophthalmic solution Place 1 drop into both eyes at bedtime.  Marland Kitchen levothyroxine (SYNTHROID, LEVOTHROID) 25 MCG tablet Take 0.5 tablets (12.5 mcg total) by mouth daily before breakfast.  . metoprolol succinate (TOPROL-XL) 100 MG 24 hr tablet Take 1 tablet  (100 mg total) by mouth daily. Take with or immediately following a meal.  . Netarsudil Dimesylate (RHOPRESSA) 0.02 % SOLN Place 1 drop into both eyes at bedtime.  . nitroGLYCERIN (NITROSTAT) 0.4 MG SL tablet Place 1 tablet (0.4 mg total) under the tongue every 5 (five) minutes x 3 doses as needed for chest pain.  . pantoprazole (PROTONIX) 40 MG tablet Take 1 tablet (40 mg total) by mouth daily.  . polyethylene glycol (MIRALAX / GLYCOLAX) packet Take 17 g by mouth daily.  . potassium chloride SA (K-DUR,KLOR-CON) 20 MEQ tablet Take 2 tablets (40 mEq total) by mouth daily. With daily lasix  . vitamin B-12 (CYANOCOBALAMIN) 1000 MCG tablet Take 1,000 mcg by mouth daily.    Allergies:   Ciprofloxacin; Hydrochlorothiazide; Sulfa antibiotics; Ace inhibitors; Losartan; Carvedilol; Lisinopril; and Soy allergy   Social History   Socioeconomic History  . Marital status: Married    Spouse name: Not  on file  . Number of children: Not on file  . Years of education: Not on file  . Highest education level: Not on file  Occupational History  . Occupation: retired  Scientific laboratory technician  . Financial resource strain: Not on file  . Food insecurity:    Worry: Not on file    Inability: Not on file  . Transportation needs:    Medical: Not on file    Non-medical: Not on file  Tobacco Use  . Smoking status: Never Smoker  . Smokeless tobacco: Never Used  Substance and Sexual Activity  . Alcohol use: No    Alcohol/week: 0.0 standard drinks    Comment: 04/30/2016 "nothing in years"  . Drug use: No  . Sexual activity: Never  Lifestyle  . Physical activity:    Days per week: Not on file    Minutes per session: Not on file  . Stress: Not on file  Relationships  . Social connections:    Talks on phone: Not on file    Gets together: Not on file    Attends religious service: Not on file    Active member of club or organization: Not on file    Attends meetings of clubs or organizations: Not on file     Relationship status: Not on file  Other Topics Concern  . Not on file  Social History Narrative   Retired    Three children - One in [place], One in [place], and one in [place].        Family History:  The patient's family history includes Heart disease in his father and mother.  ROS:   Please see the history of present illness.  All other systems are reviewed and otherwise negative.    PHYSICAL EXAM:   VS:  BP (!) 146/80   Pulse 88   Ht 5\' 6"  (1.676 m)   Wt 130 lb (59 kg)   SpO2 97%   BMI 20.98 kg/m   BMI: Body mass index is 20.98 kg/m. GEN: Well nourished, well developed chronically ill appearing WM, in no acute distress HEENT: normocephalic, atraumatic Neck: no JVD, carotid bruits, or masses Cardiac: RRR; no murmurs, rubs, or gallops, bilateral compression socks and no swelling.   Respiratory: Crackles at bases bilaterally GI: soft, nontender, nondistended, + BS MS: no deformity or atrophy Skin: warm and dry, no rash Neuro:  Alert and Oriented x 3, Strength and sensation are intact, follows commands. reporst generalized R eye blurriness that comes and goes Psych: euthymic mood, full affect  Wt Readings from Last 3 Encounters:  06/17/18 130 lb (59 kg)  05/17/18 134 lb (60.8 kg)  05/14/18 133 lb 6.4 oz (60.5 kg)    Studies/Labs Reviewed:   EKG:  EKG was ordered today and personally reviewed, it shows atrial paced rhythm with left bundle branch block.  Recent Labs: 11/07/2017: B Natriuretic Peptide 219.3 01/14/2018: NT-Pro BNP 3,168; TSH 3.390 03/31/2018: Magnesium 2.1 04/28/2018: ALT 11 06/08/2018: BUN 66; Creatinine, Ser 2.81; Hemoglobin 12.7; Platelets 159.0; Potassium 4.0; Sodium 138   Lipid Panel    Component Value Date/Time   CHOL 130 05/01/2017 0827   TRIG 70.0 05/01/2017 0827   HDL 56.00 05/01/2017 0827   CHOLHDL 2 05/01/2017 0827   VLDL 14.0 05/01/2017 0827   LDLCALC 60 05/01/2017 0827    Additional studies/ records that were reviewed today  include: Summarized above  ASSESSMENT & PLAN:   1. Bioprosthetic aortic valve endocarditis -diagnosed on November 13, 2017, completed 6  weeks course of IV antibiotics and on lifelong Ampicillin.  He takes it with probiotics and has no significant diarrhea.  He has no recurrent fever, no evidence of endocarditis on TEE on March 30, 2018.  2. CAD -status post cath on December 15, 2017 with stable findings as above, medical therapy is indicated.  3.  Paroxysmal atrial fibrillation, he is maintaining sinus rhythm, on Eliquis and no bleeding.  Most recent hemoglobin on June 08, 2018 was 12.7.  4.  Chronic combined CHF with cardiomyopathy - LV function has declined over the last several years.  We added Lasix 40 mg daily at the last visit with improvement, lower extremity edema is now resolved, he now only takes Lasix as needed.  His weight is stable, he is last creatinine was 2.8.   5. Sick sinus syndrome, status post pacemaker placement, working normally, followed by Dr. Lovena Le.  Disposition: Follow-up with me in 3 weeks on January 16 at 10:20 AM.  Medication Adjustments/Labs and Tests Ordered: Current medicines are reviewed at length with the patient today.  Concerns regarding medicines are outlined above. Medication changes, Labs and Tests ordered today are summarized above and listed in the Patient Instructions accessible in Encounters.   Signed, Ena Dawley, MD  06/17/2018 10:34 AM    [address], [place], [place]  [address] Phone: [phone]; Fax: [phone]

## 2018-06-17 NOTE — Patient Instructions (Signed)
Medication Instructions:   Your physician recommends that you continue on your current medications as directed. Please refer to the Current Medication list given to you today.  If you need a refill on your cardiac medications before your next appointment, please call your pharmacy.      Follow-Up:  3 MONTHS WITH DR Meda Coffee

## 2018-07-12 ENCOUNTER — Ambulatory Visit (INDEPENDENT_AMBULATORY_CARE_PROVIDER_SITE_OTHER): Payer: Medicare Other

## 2018-07-12 DIAGNOSIS — I429 Cardiomyopathy, unspecified: Secondary | ICD-10-CM

## 2018-07-12 DIAGNOSIS — I495 Sick sinus syndrome: Secondary | ICD-10-CM

## 2018-07-12 LAB — CUP PACEART REMOTE DEVICE CHECK
Battery Impedance: 162 Ohm
Battery Remaining Longevity: 124 mo
Battery Voltage: 2.8 V
Brady Statistic AP VP Percent: 0 %
Brady Statistic AP VS Percent: 96 %
Brady Statistic AS VP Percent: 0 %
Date Time Interrogation Session: 20200210161705
Implantable Lead Implant Date: 20171020
Implantable Lead Location: 753859
Implantable Lead Location: 753860
Implantable Lead Model: 5076
Implantable Lead Model: 5076
Implantable Pulse Generator Implant Date: 20171020
Lead Channel Impedance Value: 423 Ohm
Lead Channel Pacing Threshold Amplitude: 0.5 V
Lead Channel Pacing Threshold Amplitude: 0.625 V
Lead Channel Pacing Threshold Pulse Width: 0.4 ms
Lead Channel Pacing Threshold Pulse Width: 0.4 ms
Lead Channel Setting Pacing Amplitude: 2 V
Lead Channel Setting Pacing Amplitude: 2.5 V
Lead Channel Setting Pacing Pulse Width: 0.4 ms
Lead Channel Setting Sensing Sensitivity: 5.6 mV
MDC IDC LEAD IMPLANT DT: 20171020
MDC IDC MSMT LEADCHNL RV IMPEDANCE VALUE: 566 Ohm
MDC IDC STAT BRADY AS VS PERCENT: 4 %

## 2018-07-22 NOTE — Progress Notes (Signed)
Remote pacemaker transmission.   

## 2018-09-09 ENCOUNTER — Telehealth: Payer: Self-pay | Admitting: *Deleted

## 2018-09-09 NOTE — Telephone Encounter (Signed)
..     I have called and spoke with patient to discuss virtual visit scheduled for 09/22/2018 at 3:20 PM and provided information as below. Patient does not have a computer or smart phone and therefore will need to be a telephone visit.     TELEPHONE CALL NOTE  Ryan [name] has been deemed a candidate for a follow-up tele-health visit to limit community exposure during the Covid-19 pandemic. I spoke with the patient via phone to confirm preferred phone number, and discuss instructions and expectations.  I reminded Ryan [name] to be prepared with any vital sign and/or heart rhythm information that could potentially be obtained via home monitoring, at the time of his visit. I reminded Ryan [name] to expect a phone call at the time of his visit.  Confirm consent - "In the setting of the current Covid19 crisis, you are scheduled for a phone visit with your provider on 09/22/2018 at 3:20 PM.  Just as we do with many in-office visits, in order for you to participate in this visit, we must obtain consent.  If you'd like, I can send this to your mychart (if signed up) or email for you to review.  Otherwise, I can obtain your verbal consent now.  All virtual visits are billed to your insurance company just like a normal visit would be.  By agreeing to a virtual visit, we'd like you to understand that the technology does not allow for your provider to perform an examination, and thus may limit your provider's ability to fully assess your condition.  Finally, though the technology is pretty good, we cannot assure that it will always work on either your or our end, and in the setting of a video visit, we may have to convert it to a phone-only visit.  In either situation, we cannot ensure that we have a secure connection.    Did the patient verbally acknowledge consent to treatment? Yes  Juventino Slovak, CMA 09/09/2018 11:50 AM

## 2018-09-22 ENCOUNTER — Other Ambulatory Visit: Payer: Self-pay

## 2018-09-22 ENCOUNTER — Encounter: Payer: Self-pay | Admitting: Cardiology

## 2018-09-22 ENCOUNTER — Telehealth (INDEPENDENT_AMBULATORY_CARE_PROVIDER_SITE_OTHER): Payer: Medicare Other | Admitting: Cardiology

## 2018-09-22 DIAGNOSIS — N179 Acute kidney failure, unspecified: Secondary | ICD-10-CM

## 2018-09-22 DIAGNOSIS — I429 Cardiomyopathy, unspecified: Secondary | ICD-10-CM | POA: Diagnosis not present

## 2018-09-22 DIAGNOSIS — I5042 Chronic combined systolic (congestive) and diastolic (congestive) heart failure: Secondary | ICD-10-CM

## 2018-09-22 DIAGNOSIS — I10 Essential (primary) hypertension: Secondary | ICD-10-CM

## 2018-09-22 DIAGNOSIS — Z8619 Personal history of other infectious and parasitic diseases: Secondary | ICD-10-CM

## 2018-09-22 DIAGNOSIS — Z7901 Long term (current) use of anticoagulants: Secondary | ICD-10-CM

## 2018-09-22 DIAGNOSIS — I4891 Unspecified atrial fibrillation: Secondary | ICD-10-CM

## 2018-09-22 DIAGNOSIS — Z950 Presence of cardiac pacemaker: Secondary | ICD-10-CM

## 2018-09-22 DIAGNOSIS — I2510 Atherosclerotic heart disease of native coronary artery without angina pectoris: Secondary | ICD-10-CM

## 2018-09-22 DIAGNOSIS — I480 Paroxysmal atrial fibrillation: Secondary | ICD-10-CM | POA: Diagnosis not present

## 2018-09-22 DIAGNOSIS — Z5181 Encounter for therapeutic drug level monitoring: Secondary | ICD-10-CM

## 2018-09-22 DIAGNOSIS — I495 Sick sinus syndrome: Secondary | ICD-10-CM

## 2018-09-22 DIAGNOSIS — N184 Chronic kidney disease, stage 4 (severe): Secondary | ICD-10-CM

## 2018-09-22 DIAGNOSIS — Z9861 Coronary angioplasty status: Secondary | ICD-10-CM

## 2018-09-22 MED ORDER — METOPROLOL SUCCINATE ER 100 MG PO TB24: 100.0000 mg | ORAL_TABLET | Freq: Every day | ORAL | 3 refills | Status: DC

## 2018-09-22 MED ORDER — ALBUTEROL SULFATE HFA 108 (90 BASE) MCG/ACT IN AERS: 2.0000 | INHALATION_SPRAY | Freq: Four times a day (QID) | RESPIRATORY_TRACT | 2 refills | Status: DC | PRN

## 2018-09-22 MED ORDER — ATORVASTATIN CALCIUM 40 MG PO TABS: 40.0000 mg | ORAL_TABLET | Freq: Every day | ORAL | 3 refills | Status: DC

## 2018-09-22 NOTE — Patient Instructions (Addendum)
Medication Instructions:   DR. Meda Coffee HAS PRESCRIBED YOU ALBUTEROL (VENTOLIN HFA) INHALER-INAHLE 2 PUFFS INTO THE LUNGS EVERY 6 HOURS HAS NEEDED FOR WHEEZING OR SHORTNESS OF BREATH.  ALL YOUR OTHER REQUESTED CARDIAC MEDICATIONS WERE SENT TO YOUR CONFIRMED PHARMACY OF CHOICE.  If you need a refill on your cardiac medications before your next appointment, please call your pharmacy.       Follow-Up:  WITH DR Meda Coffee AS ANOTHER TELEPHONE VIRTUAL VISIT ON 10/20/18 AT 1:00 PM.  THIS TIME MAY CHANGE ON THIS DAY BUT WE WILL MAKE YOU AWARE IF THIS OCCURS.

## 2018-09-22 NOTE — Progress Notes (Signed)
Virtual Visit via Video Note   This visit type was conducted due to national recommendations for restrictions regarding the COVID-19 Pandemic (e.g. social distancing) in an effort to limit this patient's exposure and mitigate transmission in our community.  Due to his co-morbid illnesses, this patient is at least at moderate risk for complications without adequate follow up.  This format is felt to be most appropriate for this patient at this time.  All issues noted in this document were discussed and addressed.  A limited physical exam was performed with this format.  Please refer to the patient's chart for his consent to telehealth for [facility].   Evaluation Performed:  Follow-up visit  Date:  09/22/2018   ID:  Ryan [name], DOB 01-Jan-1930, MRN [id number]  Patient Location: Home Provider Location: Home  PCP:  Dorothyann Peng, NP  Cardiologist:  Ena Dawley, MD  Electrophysiologist:  Dr Lovena Le  Chief Complaint:  Fatigue, SOB  History of Present Illness:    Ryan [name] is a 85 y.o. 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 He was doing better until 3 daysprior to [place] he developed worsening left low back pain and profound fatigue.  He was admitted on November 07, 2017 and diagnosed with eterococcal bacteremia withprosthetic valve endocarditis.  There was a large vegetation seen on bioprosthetic aortic valve, no vegetation was seen on pacemaker leads.Patient has been on IV  antibiotics IV ampicillin and Rocephin for the endocarditis. Patient was discharged to skilled nursing facility to complete course of antibiotics, completed on December 22, 2017 During the hospital stay, the patient developed an acute kidney injury believed to be secondary to dehydration and possible endocarditis embolization. Patient's intractable back pain was further evaluated, CT showed spondylosis, mild to moderate multilevel foraminal stenosis, pars defect L 4-5.   Neurosurgery reassessed on 6/11 and indicatedthat his acute onset of back pain is due to 1 of 3 possibilities: Exacerbation of chronic lumbar spondylosis, spontaneous spinal EDH related to Eliquis or discitis. He recommends nonoperative treatment, continue antibiotics for his bacteremia and if stroke risk felt to be high without Eliquis then consider resuming it.   01/27/2018 -the patient was readmitted on January 15, 2018 for complaint of like of energy and chest pain with elevated troponin.  Coronary CTA on August 16 showed widely patent mid RCA stent the remainder of anatomy and change, the etiology of his chest pain is not supported by any change in his coronary anatomy and medical therapy was recommended.  He was treated for acute combined systolic and diastolic heart failure.  Discharged on August 20 has 2019. Today he states that he has more energy, he finally feels little better, alternates exercises with naps during the day, denies any chest pain other than heartburn at night, no lower extremity edema, no orthopnea, paroxmal nocturnal dyspnea no fevers.  04/20/2018 -patient was readmitted on October 28 with recurrent fevers, he has been started on IV ampicillin and Rocephin, his leukocytosis was improved and he appears nontoxic, TEE did not show any recurrent aortic valve endocarditis.  He is blood cultures are negative.  He received PICC line to his right arm on April 01, 2018 and will receive IV penicillin with Rocephin for 6  weeks.  He is supposed to follow with ID service as well.  May 14, 2018 -the patient is coming after 4 weeks, he has been doing well he completed his 6-week course of  IV antibiotics and is on lifelong therapy with amoxicillin.  He is also better than he has felt in a long time he denies any fever or chills, no palpitations chest pain, no lower extremity edema orthopnea or paroxysmal nocturnal dyspnea.  No bleeding.  06/17/2018 -this is 4 weeks follow-up the patient feels and looks great, he states he feels tired however he still able to perform 1 hour and stationary bike, use rowing machine and walk for about 20 minutes a day.  He has a lot of interruptions and he sleeps but takes naps during the day so overall he gets 8 hours of sleep.  He denies any fever or chills.  He now uses Lasix only occasionally as needed as his lower extremity edema has resolved.  He use compression socks.  He denies any chest pain and overall just feels really well right now.  09/22/2018 - 3 months follow up, no fever, chest pain, SOB till today after walk when he felt tired and SOB. However, overall he has felt great, he is able to walk daily and use stationary bike for an hour/day. No palpitations, dizziness or falls. He started himself on iron pills 10 days ago and has noticed very dark stools today - no blood, no abdominal pain.   The patient does not have symptoms concerning for COVID-19 infection (fever, chills, cough, or new shortness of breath).   Past Medical History:  Diagnosis Date  . Age-related macular degeneration   . Allergic rhinitis   . 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 Anemia 12/2017   normocytic.  iron deficient, ferritin 17 04/2018  . Arthritis    "hands, lower back" (04/30/2016)  . Asthma   . Bacterial endocarditis   . Carotid artery obstruction    a. s/p bilat CEA.  . Chronic combined systolic (congestive) and diastolic (congestive) heart failure ([place]) 03/18/2016  . Chronic  lower back pain   . CKD (chronic kidney disease) stage 4, GFR 15-29 ml/min (HCC) 12/2017  . Claudication in peripheral vascular disease ([place]) 01/01/2018   Claudication  . Coronary arteriosclerosis in native artery    a. 2017 Cardiac catheterization demonstrated worsening CAD with 70% mid LCx, 80% OM1 and 80% mid RCA stenosis. b.  In 11/17, he underwent successful rotational atherectomy and DES to RCA.  Marland Kitchen Decreased diffusion capacity 10/23/2017  . Diverticulitis of colon   . DJD (degenerative joint disease)   . Elevated TSH 06/30/2017  . GERD (gastroesophageal reflux disease)   . History of hiatal hernia   . HLD (hyperlipidemia)   . Hypertension   . Hypertensive heart disease with heart failure ([place])   . Intractable back pain 11/07/2017  . OSA (obstructive sleep apnea)    intolerant to CPAP  . PAF (paroxysmal atrial fibrillation) ([place])   . Paroxysmal supraventricular tachycardia ([place])   . Presence of permanent cardiac pacemaker   . Primary malignant neoplasm of bladder ([place])   . Prostate cancer ([place])   . Prosthetic valve endocarditis ([place]) 11/10/2017   enterococcal bacteremia  . S/P AVR 12/02/2017  . Syncope 10/23/2017  . Tachy-brady syndrome ([place])   . Thrombocytopenia (  [place]) 11/07/2017  . Urinary retention 11/07/2017   Past Surgical History:  Procedure Laterality Date  . APPENDECTOMY    . CARDIAC CATHETERIZATION N/A 01/17/2016   Procedure: Right/Left Heart Cath and Coronary Angiography;  Surgeon: Belva Crome, MD;  Location: [place] CV LAB;  Service: Cardiovascular;  Laterality: N/A;  . CARDIAC CATHETERIZATION N/A 04/30/2016   Procedure: Coronary/Graft Atherectomy;  Surgeon: Sherren Mocha, MD;  Location: [place] [facility];  Service: Cardiovascular;  Laterality: N/A;  . CAROTID ENDARTERECTOMY Bilateral   . CATARACT EXTRACTION W/ INTRAOCULAR LENS  IMPLANT, BILATERAL Bilateral   . COLON SURGERY  1981   Meckles Diverticulum with volvulus  . COLONOSCOPY WITH PROPOFOL N/A 05/01/2018    Procedure: COLONOSCOPY WITH PROPOFOL;  Surgeon: Rush Landmark Telford Nab., MD;  Location: [place];  Service: Gastroenterology;  Laterality: N/A;  . CORONARY ANGIOGRAPHY N/A 01/18/2018   Procedure: CORONARY ANGIOGRAPHY;  Surgeon: Lorretta Harp, MD;  Location: [place] CV LAB;  Service: Cardiovascular;  Laterality: N/A;  . ENTEROSCOPY N/A 04/30/2018   Procedure: ENTEROSCOPY;  Surgeon: Rush Landmark Telford Nab., MD;  Location: [place];  Service: Gastroenterology;  Laterality: N/A;  . EP IMPLANTABLE DEVICE N/A 03/21/2016   Procedure: Pacemaker Implant;  Surgeon: Evans Lance, MD;  Location: [place] [facility];  Service: Cardiovascular;  Laterality: N/A;  . EYE SURGERY    . INGUINAL HERNIA REPAIR Right 2009  . INSERT / REPLACE / REMOVE PACEMAKER    . INSERTION PROSTATE RADIATION SEED    . POLYPECTOMY  05/01/2018   Procedure: POLYPECTOMY;  Surgeon: Mansouraty, Telford Nab., MD;  Location: [place];  Service: Gastroenterology;;  . TEE WITHOUT CARDIOVERSION N/A 06/10/2016   Procedure: TRANSESOPHAGEAL ECHOCARDIOGRAM (TEE);  Surgeon: Sherren Mocha, MD;  Location: [place];  Service: Open Heart Surgery;  Laterality: N/A;  . TEE WITHOUT CARDIOVERSION N/A 11/10/2017   Procedure: TRANSESOPHAGEAL ECHOCARDIOGRAM (TEE);  Surgeon: Acie Fredrickson Wonda Cheng, MD;  Location[facility] ENDOSCOPY;  Service: Cardiovascular;  Laterality: N/A;  . TEE WITHOUT CARDIOVERSION N/A 12/15/2017   Procedure: TRANSESOPHAGEAL ECHOCARDIOGRAM (TEE);  Surgeon: Buford Dresser, MD;  Location: [facility] ENDOSCOPY;  Service: Cardiovascular;  Laterality: N/A;  . TEE WITHOUT CARDIOVERSION N/A 03/30/2018   Procedure: TRANSESOPHAGEAL ECHOCARDIOGRAM (TEE);  Surgeon: Jerline Pain, MD;  Location: [facility] ENDOSCOPY;  Service: Cardiovascular;  Laterality: N/A;  . TONSILLECTOMY  ~ 1947  . TRANSCATHETER AORTIC VALVE REPLACEMENT, TRANSFEMORAL N/A 06/10/2016   Procedure: TRANSCATHETER AORTIC VALVE REPLACEMENT, TRANSFEMORAL;  Surgeon: Sherren Mocha, MD;   Location: [place];  Service: Open Heart Surgery;  Laterality: N/A;     Current Meds  Medication Sig  . acetaminophen-codeine (TYLENOL #3) 300-30 MG tablet Take 0.5-1 tablets by mouth every 4 (four) hours as needed for moderate pain.  Marland Kitchen amLODipine (NORVASC) 5 MG tablet Take 1 tablet (5 mg total) by mouth daily.  Marland Kitchen amoxicillin (AMOXIL) 500 MG tablet Take 1 tablet (500 mg total) by mouth 2 (two) times daily.  Marland Kitchen apixaban (ELIQUIS) 2.5 MG TABS tablet Take 1 tablet (2.5 mg total) by mouth 2 (two) times daily.  Marland Kitchen atorvastatin (LIPITOR) 40 MG tablet Take 1 tablet (40 mg total) by mouth daily at 6 PM.  . cholecalciferol (VITAMIN D) 1000 units tablet Take 1,000 Units by mouth 2 (two) times a week.   . isosorbide mononitrate (IMDUR) 60 MG 24 hr tablet Take 1 tablet (60 mg total) by mouth daily.  Marland Kitchen latanoprost (XALATAN) 0.005 % ophthalmic solution Place 1 drop into both eyes at bedtime.  Marland Kitchen levothyroxine (SYNTHROID, LEVOTHROID)  25 MCG tablet Take 0.5 tablets (12.5 mcg total) by mouth daily before breakfast.  . metoprolol succinate (TOPROL-XL) 100 MG 24 hr tablet Take 1 tablet (100 mg total) by mouth daily. Take with or immediately following a meal.  . Netarsudil Dimesylate (RHOPRESSA) 0.02 % SOLN Place 1 drop into both eyes at bedtime.  . nitroGLYCERIN (NITROSTAT) 0.4 MG SL tablet Place 1 tablet (0.4 mg total) under the tongue every 5 (five) minutes x 3 doses as needed for chest pain.  . pantoprazole (PROTONIX) 40 MG tablet Take 40 mg by mouth as needed.  . polyethylene glycol (MIRALAX / GLYCOLAX) packet Take 17 g by mouth daily.  Marland Kitchen UNABLE TO FIND Take 1 tablet by mouth daily. Megafood blood builder vit c, folate, b12, iron and beet root.  . vitamin B-12 (CYANOCOBALAMIN) 1000 MCG tablet Take 1,000 mcg by mouth daily.     Allergies:   Ciprofloxacin; Hydrochlorothiazide; Sulfa antibiotics; Ace inhibitors; Losartan; Carvedilol; Lisinopril; and Soy allergy   Social History   Tobacco Use  . Smoking status:  Never Smoker  . Smokeless tobacco: Never Used  Substance Use Topics  . Alcohol use: No    Alcohol/week: 0.0 standard drinks    Comment: 04/30/2016 "nothing in years"  . Drug use: No     Family Hx: The patient's family history includes Heart disease in his father and mother.  ROS:   Please see the history of present illness.    All other systems reviewed and are negative.   Prior CV studies:   The following studies were reviewed today:  Labs/Other Tests and Data Reviewed:    EKG:  No ECG reviewed.  Recent Labs: 11/07/2017: B Natriuretic Peptide 219.3 01/14/2018: NT-Pro BNP 3,168; TSH 3.390 03/31/2018: Magnesium 2.1 04/28/2018: ALT 11 06/08/2018: BUN 66; Creatinine, Ser 2.81; Hemoglobin 12.7; Platelets 159.0; Potassium 4.0; Sodium 138   Recent Lipid Panel Lab Results  Component Value Date/Time   CHOL 130 05/01/2017 08:27 AM   TRIG 70.0 05/01/2017 08:27 AM   HDL 56.00 05/01/2017 08:27 AM   CHOLHDL 2 05/01/2017 08:27 AM   LDLCALC 60 05/01/2017 08:27 AM    Wt Readings from Last 3 Encounters:  09/22/18 130 lb (59 kg)  06/17/18 130 lb (59 kg)  05/17/18 134 lb (60.8 kg)     Objective:    Vital Signs:  BP 122/71   Pulse 86   Ht 5\' 6"  (1.676 m)   Wt 130 lb (59 kg)   BMI 20.98 kg/m    VITAL SIGNS:  reviewed  ASSESSMENT & PLAN:    1. Bioprosthetic aortic valve endocarditis -diagnosed on November 13, 2017, completed 6 weeks course of IV antibiotics and on lifelong Ampicillin.  He takes it with probiotics and has no significant diarrhea.  He has no recurrent fever, no evidence of endocarditis on TEE on March 30, 2018. Stable.   2. CAD -status post cath on December 15, 2017 with stable findings as above, medical therapy is indicated.  3.  Paroxysmal atrial fibrillation, he is maintaining sinus rhythm, on Eliquis, he report 1 episode of black stool, ? Melena vs sec to iron supplements, I would like to avoid lab testing in the covid pandemic and will follow with a phone call in  2 weeks. He is to call us if he continues to experience that especially if bloody stools, melena, worsening SOB or fatigue.   4.  Chronic combined CHF with cardiomyopathy - LV function has declined over the last several years.  He  seems to be well compensated, NYHA IIa.Marland Kitchen   5. Sick sinus syndrome, status post pacemaker placement, working normally, followed by Dr. Lovena Le.  COVID-19 Education: The signs and symptoms of COVID-19 were discussed with the patient and how to seek care for testing (follow up with PCP or arrange E-visit).  The importance of social distancing was discussed today.  Time:   Today, I have spent 25 minutes with the patient with telehealth technology discussing the above problems.     Medication Adjustments/Labs and Tests Ordered: Current medicines are reviewed at length with the patient today.  Concerns regarding medicines are outlined above.   Tests Ordered: No orders of the defined types were placed in this encounter.   Medication Changes: No orders of the defined types were placed in this encounter.   Disposition:  Follow up in 2 week(s)  Signed, Ena Dawley, MD  09/22/2018 3:28 PM    [place]

## 2018-09-22 NOTE — Addendum Note (Signed)
Addended by: Nuala Alpha on: 09/22/2018 04:19 PM   Modules accepted: Orders

## 2018-09-30 ENCOUNTER — Ambulatory Visit: Payer: Medicare Other | Admitting: Cardiology

## 2018-10-11 ENCOUNTER — Ambulatory Visit (INDEPENDENT_AMBULATORY_CARE_PROVIDER_SITE_OTHER): Payer: Medicare Other | Admitting: *Deleted

## 2018-10-11 ENCOUNTER — Other Ambulatory Visit: Payer: Self-pay

## 2018-10-11 DIAGNOSIS — I4891 Unspecified atrial fibrillation: Secondary | ICD-10-CM | POA: Diagnosis not present

## 2018-10-11 DIAGNOSIS — I495 Sick sinus syndrome: Secondary | ICD-10-CM

## 2018-10-12 LAB — CUP PACEART REMOTE DEVICE CHECK
Battery Impedance: 186 Ohm
Battery Remaining Longevity: 122 mo
Battery Voltage: 2.79 V
Brady Statistic AP VP Percent: 0 %
Brady Statistic AP VS Percent: 97 %
Brady Statistic AS VP Percent: 0 %
Brady Statistic AS VS Percent: 3 %
Date Time Interrogation Session: 20200511110711
Implantable Lead Implant Date: 20171020
Implantable Lead Implant Date: 20171020
Implantable Lead Location: 753859
Implantable Lead Location: 753860
Implantable Lead Model: 5076
Implantable Lead Model: 5076
Implantable Pulse Generator Implant Date: 20171020
Lead Channel Impedance Value: 447 Ohm
Lead Channel Impedance Value: 564 Ohm
Lead Channel Pacing Threshold Amplitude: 0.5 V
Lead Channel Pacing Threshold Amplitude: 0.625 V
Lead Channel Pacing Threshold Pulse Width: 0.4 ms
Lead Channel Pacing Threshold Pulse Width: 0.4 ms
Lead Channel Setting Pacing Amplitude: 2 V
Lead Channel Setting Pacing Amplitude: 2.5 V
Lead Channel Setting Pacing Pulse Width: 0.4 ms
Lead Channel Setting Sensing Sensitivity: 4 mV

## 2018-10-19 NOTE — Progress Notes (Signed)
Remote pacemaker transmission.   

## 2018-10-20 ENCOUNTER — Telehealth (INDEPENDENT_AMBULATORY_CARE_PROVIDER_SITE_OTHER): Payer: Medicare Other | Admitting: Cardiology

## 2018-10-20 ENCOUNTER — Other Ambulatory Visit: Payer: Self-pay | Admitting: Adult Health

## 2018-10-20 ENCOUNTER — Encounter: Payer: Self-pay | Admitting: Cardiology

## 2018-10-20 ENCOUNTER — Other Ambulatory Visit: Payer: Self-pay

## 2018-10-20 VITALS — BP 146/73 | HR 62 | Ht 66.5 in | Wt 131.0 lb

## 2018-10-20 DIAGNOSIS — I2510 Atherosclerotic heart disease of native coronary artery without angina pectoris: Secondary | ICD-10-CM

## 2018-10-20 DIAGNOSIS — R0609 Other forms of dyspnea: Secondary | ICD-10-CM

## 2018-10-20 DIAGNOSIS — K922 Gastrointestinal hemorrhage, unspecified: Secondary | ICD-10-CM

## 2018-10-20 DIAGNOSIS — I110 Hypertensive heart disease with heart failure: Secondary | ICD-10-CM

## 2018-10-20 DIAGNOSIS — I5043 Acute on chronic combined systolic (congestive) and diastolic (congestive) heart failure: Secondary | ICD-10-CM

## 2018-10-20 DIAGNOSIS — Z950 Presence of cardiac pacemaker: Secondary | ICD-10-CM

## 2018-10-20 DIAGNOSIS — R5383 Other fatigue: Secondary | ICD-10-CM

## 2018-10-20 DIAGNOSIS — Z952 Presence of prosthetic heart valve: Secondary | ICD-10-CM

## 2018-10-20 DIAGNOSIS — R0600 Dyspnea, unspecified: Secondary | ICD-10-CM

## 2018-10-20 NOTE — Progress Notes (Signed)
Virtual Visit via Video Note   This visit type was conducted due to national recommendations for restrictions regarding the COVID-19 Pandemic (e.g. social distancing) in an effort to limit this patient's exposure and mitigate transmission in our community.  Due to his co-morbid illnesses, this patient is at least at moderate risk for complications without adequate follow up.  This format is felt to be most appropriate for this patient at this time.  All issues noted in this document were discussed and addressed.  A limited physical exam was performed with this format.  Please refer to the patient's chart for his consent to telehealth for [facility].   Evaluation Performed:  Follow-up visit  Date:  10/20/2018   ID:  Ryan [name], DOB 1929-06-17, MRN [id number]  Patient Location: Home Provider Location: Home  PCP:  Dorothyann Peng, NP  Cardiologist:  Ena Dawley, MD  Electrophysiologist:  Dr Lovena Le  Chief Complaint:  Fatigue, SOB  History of Present Illness:    Ryan [name] is a 85 y.o. 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 He was doing better until 3 daysprior to [place] he developed worsening left low back pain and profound fatigue.  He was admitted on November 07, 2017 and diagnosed with eterococcal bacteremia withprosthetic valve endocarditis.  There was a large vegetation seen on bioprosthetic aortic valve, no vegetation was seen on pacemaker leads.Patient has been on IV  antibiotics IV ampicillin and Rocephin for the endocarditis. Patient was discharged to skilled nursing facility to complete course of antibiotics, completed on December 22, 2017 During the hospital stay, the patient developed an acute kidney injury believed to be secondary to dehydration and possible endocarditis embolization. Patient's intractable back pain was further evaluated, CT showed spondylosis, mild to moderate multilevel foraminal stenosis, pars defect L 4-5.   Neurosurgery reassessed on 6/11 and indicatedthat his acute onset of back pain is due to 1 of 3 possibilities: Exacerbation of chronic lumbar spondylosis, spontaneous spinal EDH related to Eliquis or discitis. He recommends nonoperative treatment, continue antibiotics for his bacteremia and if stroke risk felt to be high without Eliquis then consider resuming it.   01/27/2018 -the patient was readmitted on January 15, 2018 for complaint of like of energy and chest pain with elevated troponin.  Coronary CTA on August 16 showed widely patent mid RCA stent the remainder of anatomy and change, the etiology of his chest pain is not supported by any change in his coronary anatomy and medical therapy was recommended.  He was treated for acute combined systolic and diastolic heart failure.  Discharged on August 20 has 2019. Today he states that he has more energy, he finally feels little better, alternates exercises with naps during the day, denies any chest pain other than heartburn at night, no lower extremity edema, no orthopnea, paroxmal nocturnal dyspnea no fevers.  04/20/2018 -patient was readmitted on October 28 with recurrent fevers, he has been started on IV ampicillin and Rocephin, his leukocytosis was improved and he appears nontoxic, TEE did not show any recurrent aortic valve endocarditis.  He is blood cultures are negative.  He received PICC line to his right arm on April 01, 2018 and will receive IV penicillin with Rocephin for 6  weeks.  He is supposed to follow with ID service as well.  May 14, 2018 -the patient is coming after 4 weeks, he has been doing well he completed his 6-week course of  IV antibiotics and is on lifelong therapy with amoxicillin.  He is also better than he has felt in a long time he denies any fever or chills, no palpitations chest pain, no lower extremity edema orthopnea or paroxysmal nocturnal dyspnea.  No bleeding.  06/17/2018 -this is 4 weeks follow-up the patient feels and looks great, he states he feels tired however he still able to perform 1 hour and stationary bike, use rowing machine and walk for about 20 minutes a day.  He has a lot of interruptions and he sleeps but takes naps during the day so overall he gets 8 hours of sleep.  He denies any fever or chills.  He now uses Lasix only occasionally as needed as his lower extremity edema has resolved.  He use compression socks.  He denies any chest pain and overall just feels really well right now.  09/22/2018 - 3 months follow up, no fever, chest pain, SOB till today after walk when he felt tired and SOB. However, overall he has felt great, he is able to walk daily and use stationary bike for an hour/day. No palpitations, dizziness or falls. He started himself on iron pills 10 days ago and has noticed very dark stools today - no blood, no abdominal pain.  10/20/2018 -today the patient states that he has been more short of breath since the last virtual visit, he tries to do stationary bike and walk every day but has noticed that now gets short of breath easily, this is also been noticed by his wife and son-in-law.  He denies any orthopnea or proximal nocturnal dyspnea, he has minimal lower extremity edema and he wears compression socks.  He denies any chest pain no fever chills or cough.  He continues to take iron pills 1 a day.  Denies any melena or bleeding.   The patient does not have symptoms concerning for COVID-19 infection (fever,  chills, cough, or new shortness of breath).   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  iron deficient, ferritin 17 04/2018   Arthritis    "hands, lower back" (04/30/2016)   Asthma    Bacterial endocarditis    Carotid artery obstruction    a. s/p bilat CEA.   Chronic combined systolic (congestive) and diastolic (congestive) heart failure (HCC) 03/18/2016   Chronic lower back pain    CKD (chronic kidney disease) stage 4, GFR 15-29 ml/min (HCC) 12/2017   Claudication in peripheral vascular disease ([place]) 01/01/2018   Claudication   Coronary arteriosclerosis in native artery    a. 2017 Cardiac catheterization demonstrated worsening CAD with 70% mid LCx, 80% OM1 and 80% mid RCA stenosis. b.  In 11/17, he underwent successful rotational atherectomy and DES to RCA.   Decreased diffusion capacity 10/23/2017   Diverticulitis of colon    DJD (degenerative joint disease)    Elevated TSH 06/30/2017   GERD (gastroesophageal reflux disease)    History of hiatal hernia    HLD (hyperlipidemia)    Hypertension  Hypertensive heart disease with heart failure (HCC)    Intractable back pain 11/07/2017   OSA (obstructive sleep apnea)    intolerant to CPAP   PAF (paroxysmal atrial fibrillation) (HCC)    Paroxysmal supraventricular tachycardia (HCC)    Presence of permanent cardiac pacemaker    Primary malignant neoplasm of bladder [facility])    Prostate cancer ([place])    Prosthetic valve endocarditis ([place]) 11/10/2017   enterococcal bacteremia   S/P AVR 12/02/2017   Syncope 10/23/2017   Tachy-brady syndrome ([place])    Thrombocytopenia ([place]) 11/07/2017   Urinary retention 11/07/2017   Past Surgical History:  Procedure Laterality Date   APPENDECTOMY     CARDIAC CATHETERIZATION N/A 01/17/2016   Procedure: Right/Left Heart Cath and Coronary Angiography;   Surgeon: Belva Crome, MD;  Location: [place] CV [facility];  Service: Cardiovascular;  Laterality: N/A;   CARDIAC CATHETERIZATION N/A 04/30/2016   Procedure: Coronary/Graft Atherectomy;  Surgeon: Sherren Mocha, MD;  Location: [place] [facility];  Service: Cardiovascular;  Laterality: N/A;   CAROTID ENDARTERECTOMY Bilateral    CATARACT EXTRACTION W/ INTRAOCULAR LENS  IMPLANT, BILATERAL Bilateral    COLON SURGERY  1981   Meckles Diverticulum with volvulus   COLONOSCOPY WITH PROPOFOL N/A 05/01/2018   Procedure: COLONOSCOPY WITH PROPOFOL;  Surgeon: Rush Landmark Telford Nab., MD;  Location: [place];  Service: Gastroenterology;  Laterality: N/A;   CORONARY ANGIOGRAPHY N/A 01/18/2018   Procedure: CORONARY ANGIOGRAPHY;  Surgeon: Lorretta Harp, MD;  Location: [place] [facility];  Service: Cardiovascular;  Laterality: N/A;   ENTEROSCOPY N/A 04/30/2018   Procedure: ENTEROSCOPY;  Surgeon: Rush Landmark Telford Nab., MD;  Location: [place];  Service: Gastroenterology;  Laterality: N/A;   EP IMPLANTABLE DEVICE N/A 03/21/2016   Procedure: Pacemaker Implant;  Surgeon: Evans Lance, MD;  Location: [place] [facility];  Service: Cardiovascular;  Laterality: N/A;   EYE SURGERY     INGUINAL HERNIA REPAIR Right 2009   INSERT / REPLACE / REMOVE PACEMAKER     INSERTION PROSTATE RADIATION SEED     POLYPECTOMY  05/01/2018   Procedure: POLYPECTOMY;  Surgeon: Rush Landmark Telford Nab., MD;  Location: [place];  Service: Gastroenterology;;   TEE WITHOUT CARDIOVERSION N/A 06/10/2016   Procedure: TRANSESOPHAGEAL ECHOCARDIOGRAM (TEE);  Surgeon: Sherren Mocha, MD;  Location: [place];  Service: Open Heart Surgery;  Laterality: N/A;   TEE WITHOUT CARDIOVERSION N/A 11/10/2017   Procedure: TRANSESOPHAGEAL ECHOCARDIOGRAM (TEE);  Surgeon: Acie Fredrickson Wonda Cheng, MD;  Location: [facility] ENDOSCOPY;  Service: Cardiovascular;  Laterality: N/A;   TEE WITHOUT CARDIOVERSION N/A 12/15/2017   Procedure: TRANSESOPHAGEAL  ECHOCARDIOGRAM (TEE);  Surgeon: Buford Dresser, MD;  Location: [facility] ENDOSCOPY;  Service: Cardiovascular;  Laterality: N/A;   TEE WITHOUT CARDIOVERSION N/A 03/30/2018   Procedure: TRANSESOPHAGEAL ECHOCARDIOGRAM (TEE);  Surgeon: Jerline Pain, MD;  Location[facility] END[facility];  Service: Cardiovascular;  Laterality: N/A;   TONSILLECTOMY  ~ 1947   TRANSCATHETER AORTIC VALVE REPLACEMENT, TRANSFEMORAL N/A 06/10/2016   Procedure: TRANSCATHETER AORTIC VALVE REPLACEMENT, TRANSFEMORAL;  Surgeon: Sherren Mocha, MD;  Location: [place];  Service: Open Heart Surgery;  Laterality: N/A;     Current Meds  Medication Sig   acetaminophen-codeine (TYLENOL #3) 300-30 MG tablet Take 0.5-1 tablets by mouth every 4 (four) hours as needed for moderate pain.   albuterol (VENTOLIN HFA) 108 (90 Base) MCG/ACT inhaler Inhale 2 puffs into the lungs every 6 (six) hours as needed for wheezing or shortness of breath.   amLODipine (NORVASC) 5 MG tablet Take 1 tablet (5 mg  total) by mouth daily.   amoxicillin (AMOXIL) 500 MG tablet Take 1 tablet (500 mg total) by mouth 2 (two) times daily.   apixaban (ELIQUIS) 2.5 MG TABS tablet Take 1 tablet (2.5 mg total) by mouth 2 (two) times daily.   atorvastatin (LIPITOR) 40 MG tablet Take 1 tablet (40 mg total) by mouth daily at 6 PM.   cholecalciferol (VITAMIN D) 1000 units tablet Take 1,000 Units by mouth 2 (two) times a week.    isosorbide mononitrate (IMDUR) 60 MG 24 hr tablet Take 1 tablet (60 mg total) by mouth daily.   latanoprost (XALATAN) 0.005 % ophthalmic solution Place 1 drop into both eyes at bedtime.   levothyroxine (SYNTHROID, LEVOTHROID) 25 MCG tablet Take 0.5 tablets (12.5 mcg total) by mouth daily before breakfast.   metoprolol succinate (TOPROL-XL) 100 MG 24 hr tablet Take 1 tablet (100 mg total) by mouth daily. Take with or immediately following a meal.   Netarsudil Dimesylate (RHOPRESSA) 0.02 % SOLN Place 1 drop into both eyes at bedtime.   nitroGLYCERIN  (NITROSTAT) 0.4 MG SL tablet Place 1 tablet (0.4 mg total) under the tongue every 5 (five) minutes x 3 doses as needed for chest pain.   pantoprazole (PROTONIX) 40 MG tablet Take 40 mg by mouth as needed.   polyethylene glycol (MIRALAX / GLYCOLAX) 17 g packet Take 17 g by mouth daily as needed.   UNABLE TO FIND Take 1 tablet by mouth daily. Megafood blood builder vit c, folate, b12, iron and beet root.   vitamin B-12 (CYANOCOBALAMIN) 1000 MCG tablet Take 1,000 mcg by mouth daily.     Allergies:   Ciprofloxacin; Hydrochlorothiazide; Sulfa antibiotics; Ace inhibitors; Losartan; Carvedilol; Lisinopril; and Soy allergy   Social History   Tobacco Use   Smoking status: Never Smoker   Smokeless tobacco: Never Used  Substance Use Topics   Alcohol use: No    Alcohol/week: 0.0 standard drinks    Comment: 04/30/2016 "nothing in years"   Drug use: No     Family Hx: The patient's family history includes Heart disease in his father and mother.  ROS:   Please see the history of present illness.    All other systems reviewed and are negative.   Prior CV studies:   The following studies were reviewed today:  Labs/Other Tests and Data Reviewed:    EKG:  No ECG reviewed.  Recent Labs: 11/07/2017: B Natriuretic Peptide 219.3 01/14/2018: NT-Pro BNP 3,168; TSH 3.390 03/31/2018: Magnesium 2.1 04/28/2018: ALT 11 06/08/2018: BUN 66; Creatinine, Ser 2.81; Hemoglobin 12.7; Platelets 159.0; Potassium 4.0; Sodium 138   Recent Lipid Panel Lab Results  Component Value Date/Time   CHOL 130 05/01/2017 08:27 AM   TRIG 70.0 05/01/2017 08:27 AM   HDL 56.00 05/01/2017 08:27 AM   CHOLHDL 2 05/01/2017 08:27 AM   LDLCALC 60 05/01/2017 08:27 AM    Wt Readings from Last 3 Encounters:  10/20/18 131 lb (59.4 kg)  09/22/18 130 lb (59 kg)  06/17/18 130 lb (59 kg)     Objective:    Vital Signs:  BP (!) 146/73    Pulse 62    Ht 5' 6.5" (1.689 m)    Wt 131 lb (59.4 kg)    BMI 20.83 kg/m    VITAL  SIGNS:  reviewed    ASSESSMENT & PLAN:    1.  Dyspnea on exertion -Can be multifactorial, patient has a complicated cardiac history, this seems to be exertional and not related to CHF as he  denies any orthopnea proximal nocturnal dyspnea no lower extremity edema and stable weight.  Also has history of CAD last cath in August 2019 showed Prox Cx lesion is 70% stenosed. Ost 1st Mrg lesion is 75% stenosed. Previously placed Mid RCA stent (unknown type) is widely patent. Prox RCA lesion is 40% stenosed. Post Atrio lesion is 75% stenosed. Ost LAD to Prox LAD lesion is 30% stenosed.  This certainly can be progression of his disease, he is not a good Candidate considering CKD stage IV.  If his labs normal and no evidence of CHF or anemia we will plan for a stress test.  2. Bioprosthetic aortic valve endocarditis -diagnosed on November 13, 2017, completed 6 weeks course of IV antibiotics and on lifelong Ampicillin.  He takes it with probiotics and has no significant diarrhea.  He has no recurrent fever, no evidence of endocarditis on TEE on March 30, 2018. Stable.   3.  Paroxysmal atrial fibrillation, he is maintaining sinus rhythm, on Eliquis, we will recheck his hemoglobin now.   4.  Chronic combined CHF with cardiomyopathy - LV function has declined over the last several years.    He is now functional class NYHA IIb.  We will obtain creatinine and BNP.  5. Sick sinus syndrome, status post pacemaker placement, working normally, followed by Dr. Lovena Le.  COVID-19 Education: The signs and symptoms of COVID-19 were discussed with the patient and how to seek care for testing (follow up with PCP or arrange E-visit).  The importance of social distancing was discussed today.  Time:   Today, I have spent 25 minutes with the patient with telehealth technology discussing the above problems.     Medication Adjustments/Labs and Tests Ordered: Current medicines are reviewed at length with the patient today.   Concerns regarding medicines are outlined above.   Tests Ordered: No orders of the defined types were placed in this encounter.   Medication Changes: No orders of the defined types were placed in this encounter.   Disposition:  Follow up in 2 week(s)  Signed, Ena Dawley, MD  10/20/2018 11:21 AM    [place]

## 2018-10-20 NOTE — Patient Instructions (Signed)
Medication Instructions:   Your physician recommends that you continue on your current medications as directed. Please refer to the Current Medication list given to you today.  If you need a refill on your cardiac medications before your next appointment, please call your pharmacy.    Lab work:  TOMORROW 10/21/18 AT 9:45 AM AT OUR OFFICE TO CHECK--CMET, CBC W DIFF, TSH, AND PRO-BNP  If you have labs (blood work) drawn today and your tests are completely normal, you will receive your results only by: Marland Kitchen MyChart Message (if you have MyChart) OR . A paper copy in the mail If you have any lab test that is abnormal or we need to change your treatment, we will call you to review the results.    Follow-Up:  DR Meda Coffee WILL SEE YOU AS ANOTHER TELEPHONE VIRTUAL VISIT ON November 04, 2018 AT 8:30 AM

## 2018-10-21 ENCOUNTER — Other Ambulatory Visit: Payer: Self-pay

## 2018-10-21 ENCOUNTER — Other Ambulatory Visit: Payer: Medicare Other | Admitting: *Deleted

## 2018-10-21 DIAGNOSIS — I5043 Acute on chronic combined systolic (congestive) and diastolic (congestive) heart failure: Secondary | ICD-10-CM

## 2018-10-21 DIAGNOSIS — I110 Hypertensive heart disease with heart failure: Secondary | ICD-10-CM

## 2018-10-21 DIAGNOSIS — Z952 Presence of prosthetic heart valve: Secondary | ICD-10-CM

## 2018-10-21 DIAGNOSIS — I2510 Atherosclerotic heart disease of native coronary artery without angina pectoris: Secondary | ICD-10-CM

## 2018-10-21 DIAGNOSIS — Z950 Presence of cardiac pacemaker: Secondary | ICD-10-CM

## 2018-10-22 ENCOUNTER — Telehealth: Payer: Self-pay | Admitting: *Deleted

## 2018-10-22 LAB — CBC WITH DIFFERENTIAL/PLATELET
Basophils Absolute: 0.1 10*3/uL (ref 0.0–0.2)
Basos: 1 %
EOS (ABSOLUTE): 0.2 10*3/uL (ref 0.0–0.4)
Eos: 2 %
Hematocrit: 39.1 % (ref 37.5–51.0)
Hemoglobin: 13.3 g/dL (ref 13.0–17.7)
Immature Grans (Abs): 0 10*3/uL (ref 0.0–0.1)
Immature Granulocytes: 1 %
Lymphocytes Absolute: 1.5 10*3/uL (ref 0.7–3.1)
Lymphs: 21 %
MCH: 31.7 pg (ref 26.6–33.0)
MCHC: 34 g/dL (ref 31.5–35.7)
MCV: 93 fL (ref 79–97)
Monocytes Absolute: 1 10*3/uL — ABNORMAL HIGH (ref 0.1–0.9)
Monocytes: 15 %
Neutrophils Absolute: 4.2 10*3/uL (ref 1.4–7.0)
Neutrophils: 60 %
Platelets: 186 10*3/uL (ref 150–450)
RBC: 4.19 x10E6/uL (ref 4.14–5.80)
RDW: 12.6 % (ref 11.6–15.4)
WBC: 6.9 10*3/uL (ref 3.4–10.8)

## 2018-10-22 LAB — COMPREHENSIVE METABOLIC PANEL
ALT: 13 IU/L (ref 0–44)
AST: 27 IU/L (ref 0–40)
Albumin/Globulin Ratio: 1.9 (ref 1.2–2.2)
Albumin: 4.3 g/dL (ref 3.6–4.6)
Alkaline Phosphatase: 82 IU/L (ref 39–117)
BUN/Creatinine Ratio: 23 (ref 10–24)
BUN: 52 mg/dL — ABNORMAL HIGH (ref 8–27)
Bilirubin Total: 0.3 mg/dL (ref 0.0–1.2)
CO2: 23 mmol/L (ref 20–29)
Calcium: 9.7 mg/dL (ref 8.6–10.2)
Chloride: 104 mmol/L (ref 96–106)
Creatinine, Ser: 2.23 mg/dL — ABNORMAL HIGH (ref 0.76–1.27)
GFR calc Af Amer: 30 mL/min/{1.73_m2} — ABNORMAL LOW (ref >59)
GFR calc non Af Amer: 26 mL/min/{1.73_m2} — ABNORMAL LOW (ref >59)
Globulin, Total: 2.3 g/dL (ref 1.5–4.5)
Glucose: 144 mg/dL — ABNORMAL HIGH (ref 65–99)
Potassium: 4.5 mmol/L (ref 3.5–5.2)
Sodium: 143 mmol/L (ref 134–144)
Total Protein: 6.6 g/dL (ref 6.0–8.5)

## 2018-10-22 LAB — PRO B NATRIURETIC PEPTIDE: NT-Pro BNP: 516 pg/mL — ABNORMAL HIGH (ref 0–486)

## 2018-10-22 LAB — TSH: TSH: 3.15 u[IU]/mL (ref 0.450–4.500)

## 2018-10-22 NOTE — Telephone Encounter (Signed)
Erroneous encounter

## 2018-11-04 ENCOUNTER — Telehealth (INDEPENDENT_AMBULATORY_CARE_PROVIDER_SITE_OTHER): Payer: Medicare Other | Admitting: Cardiology

## 2018-11-04 ENCOUNTER — Encounter: Payer: Self-pay | Admitting: Cardiology

## 2018-11-04 ENCOUNTER — Other Ambulatory Visit: Payer: Self-pay

## 2018-11-04 VITALS — BP 151/71 | HR 61 | Ht 66.0 in | Wt 132.5 lb

## 2018-11-04 DIAGNOSIS — I4891 Unspecified atrial fibrillation: Secondary | ICD-10-CM

## 2018-11-04 DIAGNOSIS — I480 Paroxysmal atrial fibrillation: Secondary | ICD-10-CM

## 2018-11-04 DIAGNOSIS — R0609 Other forms of dyspnea: Secondary | ICD-10-CM | POA: Diagnosis not present

## 2018-11-04 DIAGNOSIS — I2510 Atherosclerotic heart disease of native coronary artery without angina pectoris: Secondary | ICD-10-CM | POA: Diagnosis not present

## 2018-11-04 DIAGNOSIS — I10 Essential (primary) hypertension: Secondary | ICD-10-CM

## 2018-11-04 DIAGNOSIS — I5043 Acute on chronic combined systolic (congestive) and diastolic (congestive) heart failure: Secondary | ICD-10-CM | POA: Diagnosis not present

## 2018-11-04 DIAGNOSIS — R0600 Dyspnea, unspecified: Secondary | ICD-10-CM

## 2018-11-04 MED ORDER — FUROSEMIDE 20 MG PO TABS: 20.0000 mg | ORAL_TABLET | ORAL | 2 refills | Status: DC

## 2018-11-04 NOTE — Progress Notes (Signed)
Virtual Visit via Video Note   This visit type was conducted due to national recommendations for restrictions regarding the COVID-19 Pandemic (e.g. social distancing) in an effort to limit this patient's exposure and mitigate transmission in our community.  Due to his co-morbid illnesses, this patient is at least at moderate risk for complications without adequate follow up.  This format is felt to be most appropriate for this patient at this time.  All issues noted in this document were discussed and addressed.  A limited physical exam was performed with this format.  Please refer to the patient's chart for his consent to telehealth for [facility].   Evaluation Performed:  Follow-up visit  Date:  11/04/2018   ID:  Ryan [name], DOB 1929/07/28, MRN [id number]  Patient Location: Home Provider Location: Home  PCP:  Dorothyann Peng, NP  Cardiologist:  Ena Dawley, MD  Electrophysiologist:  Dr Lovena Le  Chief Complaint:  Fatigue, SOB  History of Present Illness:    Ryan [name] is a 85 y.o. 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 He was doing better [place] [address] [place] he developed worsening left low back pain and profound fatigue.  He was admitted on November 07, 2017 and diagnosed with eterococcal bacteremia withprosthetic valve endocarditis.  There was a large vegetation seen on bioprosthetic aortic valve, no vegetation was seen on pacemaker leads.Patient has been on IV  antibiotics IV ampicillin and Rocephin for the endocarditis. Patient was discharged to skilled nursing facility to complete course of antibiotics, completed on December 22, 2017 During the hospital stay, the patient developed an acute kidney injury believed to be secondary to dehydration and possible endocarditis embolization. Patient's intractable back pain was further evaluated, CT showed spondylosis, mild to moderate multilevel foraminal stenosis, pars defect L 4-5.   Neurosurgery reassessed on 6/11 and indicatedthat his acute onset of back pain is due to 1 of 3 possibilities: Exacerbation of chronic lumbar spondylosis, spontaneous spinal EDH related to Eliquis or discitis. He recommends nonoperative treatment, continue antibiotics for his bacteremia and if stroke risk felt to be high without Eliquis then consider resuming it.   01/27/2018 -the patient was readmitted on January 15, 2018 for complaint of like of energy and chest pain with elevated troponin.  Coronary CTA on August 16 showed widely patent mid RCA stent the remainder of anatomy and change, the etiology of his chest pain is not supported by any change in his coronary anatomy and medical therapy was recommended.  He was treated for acute combined systolic and diastolic heart failure.  Discharged on August 20 has 2019. Today he states that he has more energy, he finally feels little better, alternates exercises with naps during the day, denies any chest pain other than heartburn at night, no lower extremity edema, no orthopnea, paroxmal nocturnal dyspnea no fevers.  04/20/2018 -patient was readmitted on October 28 with recurrent fevers, he has been started on IV ampicillin and Rocephin, his leukocytosis was improved and he appears nontoxic, TEE did not show any recurrent aortic valve endocarditis.  He is blood cultures are negative.  He received PICC line to his right arm on April 01, 2018 and will receive IV penicillin with Rocephin for 6  weeks.  He is supposed to follow with ID service as well.  May 14, 2018 -the patient is coming after 4 weeks, he has been doing well he completed his 6-week course of  IV antibiotics and is on lifelong therapy with amoxicillin.  He is also better than he has felt in a long time he denies any fever or chills, no palpitations chest pain, no lower extremity edema orthopnea or paroxysmal nocturnal dyspnea.  No bleeding.  06/17/2018 -this is 4 weeks follow-up the patient feels and looks great, he states he feels tired however he still able to perform 1 hour and stationary bike, use rowing machine and walk for about 20 minutes a day.  He has a lot of interruptions and he sleeps but takes naps during the day so overall he gets 8 hours of sleep.  He denies any fever or chills.  He now uses Lasix only occasionally as needed as his lower extremity edema has resolved.  He use compression socks.  He denies any chest pain and overall just feels really well right now.  09/22/2018 - 3 months follow up, no fever, chest pain, SOB till today after walk when he felt tired and SOB. However, overall he has felt great, he is able to walk daily and use stationary bike for an hour/day. No palpitations, dizziness or falls. He started himself on iron pills 10 days ago and has noticed very dark stools today - no blood, no abdominal pain.  10/20/2018 -today the patient states that he has been more short of breath since the last virtual visit, he tries to do stationary bike and walk every day but has noticed that now gets short of breath easily, this is also been noticed by his wife and son-in-law.  He denies any orthopnea or proximal nocturnal dyspnea, he has minimal lower extremity edema and he wears compression socks.  He denies any chest pain no fever chills or cough.  He continues to take iron pills 1 a day.  Denies any melena or bleeding.  11/04/2018 - 2 weeks follow up, he stays that his exercise capacity is decreasing with  walking, last Saturday he had an episode of chest pain radiating to his neck relieved by nitroglycerin in less than 5 minutes.  He continues to be active, exercising for an hour every day he has no problem when he is on a rowing machine or stationary bike and only gets short of breath when he is walking.  His blood pressure has been slightly elevated and he had admits to eating some extra salt in his diet.  The patient does not have symptoms concerning for COVID-19 infection (fever, chills, cough, or new shortness of breath).   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  iron deficient, ferritin 17 04/2018   Arthritis    "hands, lower back" (04/30/2016)   Asthma    Bacterial endocarditis    Carotid artery obstruction    a. s/p bilat CEA.   Chronic combined systolic (congestive) and diastolic (congestive) heart failure (HCC) 03/18/2016   Chronic lower back pain    CKD (chronic kidney disease) stage 4, GFR 15-29 ml/min (HCC) 12/2017   Claudication in peripheral vascular  disease ([place]) 01/01/2018   Claudication   Coronary arteriosclerosis in native artery    a. 2017 Cardiac catheterization demonstrated worsening CAD with 70% mid LCx, 80% OM1 and 80% mid RCA stenosis. b.  In 11/17, he underwent successful rotational atherectomy and DES to RCA.   Decreased diffusion capacity 10/23/2017   Diverticulitis of colon    DJD (degenerative joint disease)    Elevated TSH 06/30/2017   GERD (gastroesophageal reflux disease)    History of hiatal hernia    HLD (hyperlipidemia)    Hypertension    Hypertensive heart disease with heart failure (HCC)    Intractable back pain 11/07/2017   OSA (obstructive sleep apnea)    intolerant to CPAP   PAF (paroxysmal atrial fibrillation) (HCC)    Paroxysmal supraventricular tachycardia (HCC)    Presence of permanent  cardiac pacemaker    Primary malignant neoplasm of bladder ([place])    Prostate cancer ([place])    Prosthetic valve endocarditis ([place]) 11/10/2017   enterococcal bacteremia   S/P AVR 12/02/2017   Syncope 10/23/2017   Tachy-brady syndrome ([place])    Thrombocytopenia ([place]) 11/07/2017   Urinary retention 11/07/2017   Past Surgical History:  Procedure Laterality Date   APPENDECTOMY     CARDIAC CATHETERIZATION N/A 01/17/2016   Procedure: Right/Left Heart Cath and Coronary Angiography;  Surgeon: Belva Crome, MD;  Location: [place] [facility];  Service: Cardiovascular;  Laterality: N/A;   CARDIAC CATHETERIZATION N/A 04/30/2016   Procedure: Coronary/Graft Atherectomy;  Surgeon: Sherren Mocha, MD;  Location: [place] [facility];  Service: Cardiovascular;  Laterality: N/A;   CAROTID ENDARTERECTOMY Bilateral    CATARACT EXTRACTION W/ INTRAOCULAR LENS  IMPLANT, BILATERAL Bilateral    COLON SURGERY  1981   Meckles Diverticulum with volvulus   COLONOSCOPY WITH PROPOFOL N/A 05/01/2018   Procedure: COLONOSCOPY WITH PROPOFOL;  Surgeon: Rush Landmark Telford Nab., MD;  Location: [place];  Service: Gastroenterology;  Laterality: N/A;   CORONARY ANGIOGRAPHY N/A 01/18/2018   Procedure: CORONARY ANGIOGRAPHY;  Surgeon: Lorretta Harp, MD;  Location: [place] [facility];  Service: Cardiovascular;  Laterality: N/A;   ENTEROSCOPY N/A 04/30/2018   Procedure: ENTEROSCOPY;  Surgeon: Rush Landmark Telford Nab., MD;  Location: [place];  Service: Gastroenterology;  Laterality: N/A;   EP IMPLANTABLE DEVICE N/A 03/21/2016   Procedure: Pacemaker Implant;  Surgeon: Evans Lance, MD;  Location: [place] [facility];  Service: Cardiovascular;  Laterality: N/A;   EYE SURGERY     INGUINAL HERNIA REPAIR Right 2009   INSERT / REPLACE / REMOVE PACEMAKER     INSERTION PROSTATE RADIATION SEED     POLYPECTOMY  05/01/2018   Procedure: POLYPECTOMY;  Surgeon: Rush Landmark Telford Nab., MD;  Location: [place];   Service: Gastroenterology;;   TEE WITHOUT CARDIOVERSION N/A 06/10/2016   Procedure: TRANSESOPHAGEAL ECHOCARDIOGRAM (TEE);  Surgeon: Sherren Mocha, MD;  Location: [place];  Service: Open Heart Surgery;  Laterality: N/A;   TEE WITHOUT CARDIOVERSION N/A 11/10/2017   Procedure: TRANSESOPHAGEAL ECHOCARDIOGRAM (TEE);  Surgeon: Acie Fredrickson Wonda Cheng, MD;  Location: [facility];  Service: Cardiovascular;  Laterality: N/A;   TEE WITHOUT CARDIOVERSION N/A 12/15/2017   Procedure: TRANSESOPHAGEAL ECHOCARDIOGRAM (TEE);  Surgeon: Buford Dresser, MD;  Location: [facility] ENDOSCOPY;  Service: Cardiovascular;  Laterality: N/A;   TEE WITHOUT CARDIOVERSION N/A 03/30/2018   Procedure: TRANSESOPHAGEAL ECHOCARDIOGRAM (TEE);  Surgeon: Jerline Pain, MD;  Location: [facility]COPY;  Service: Cardiovascular;  Laterality: N/A;   TONSILLECTOMY  ~ 1947   [place], TRANSFEMORAL N/A 06/10/2016  Procedure: TRANSCATHETER AORTIC VALVE REPLACEMENT, TRANSFEMORAL;  Surgeon: Sherren Mocha, MD;  Location: [place];  Service: Open Heart Surgery;  Laterality: N/A;     Current Meds  Medication Sig   acetaminophen-codeine (TYLENOL #3) 300-30 MG tablet Take 0.5-1 tablets by mouth every 4 (four) hours as needed for moderate pain.   albuterol (VENTOLIN HFA) 108 (90 Base) MCG/ACT inhaler Inhale 2 puffs into the lungs every 6 (six) hours as needed for wheezing or shortness of breath.   amLODipine (NORVASC) 5 MG tablet Take 1 tablet (5 mg total) by mouth daily.   amoxicillin (AMOXIL) 500 MG tablet Take 1 tablet (500 mg total) by mouth 2 (two) times daily.   apixaban (ELIQUIS) 2.5 MG TABS tablet Take 1 tablet (2.5 mg total) by mouth 2 (two) times daily.   atorvastatin (LIPITOR) 40 MG tablet Take 1 tablet (40 mg total) by mouth daily at 6 PM.   cholecalciferol (VITAMIN D) 1000 units tablet Take 1,000 Units by mouth 2 (two) times a week.    isosorbide mononitrate (IMDUR) 60 MG 24 hr tablet Take 1 tablet (60 mg  total) by mouth daily.   latanoprost (XALATAN) 0.005 % ophthalmic solution Place 1 drop into both eyes at bedtime.   levothyroxine (SYNTHROID, LEVOTHROID) 25 MCG tablet Take 0.5 tablets (12.5 mcg total) by mouth daily before breakfast.   metoprolol succinate (TOPROL-XL) 100 MG 24 hr tablet Take 1 tablet (100 mg total) by mouth daily. Take with or immediately following a meal.   Netarsudil Dimesylate (RHOPRESSA) 0.02 % SOLN Place 1 drop into both eyes at bedtime.   nitroGLYCERIN (NITROSTAT) 0.4 MG SL tablet Place 1 tablet (0.4 mg total) under the tongue every 5 (five) minutes x 3 doses as needed for chest pain.   pantoprazole (PROTONIX) 40 MG tablet Take 40 mg by mouth as needed.   polyethylene glycol (MIRALAX / GLYCOLAX) 17 g packet Take 17 g by mouth daily as needed.   UNABLE TO FIND Take 1 tablet by mouth daily. Megafood blood builder vit c, folate, b12, iron and beet root.   vitamin B-12 (CYANOCOBALAMIN) 1000 MCG tablet Take 1,000 mcg by mouth daily.     Allergies:   Ciprofloxacin; Hydrochlorothiazide; Sulfa antibiotics; Ace inhibitors; Losartan; Carvedilol; Lisinopril; and Soy allergy   Social History   Tobacco Use   Smoking status: Never Smoker   Smokeless tobacco: Never Used  Substance Use Topics   Alcohol use: No    Alcohol/week: 0.0 standard drinks    Comment: 04/30/2016 "nothing in years"   Drug use: No     Family Hx: The patient's family history includes Heart disease in his father and mother.  ROS:   Please see the history of present illness.    All other systems reviewed and are negative.   Prior CV studies:   The following studies were reviewed today:  Labs/Other Tests and Data Reviewed:    EKG:  No ECG reviewed.  Recent Labs: 11/07/2017: B Natriuretic Peptide 219.3 03/31/2018: Magnesium 2.1 10/21/2018: ALT 13; BUN 52; Creatinine, Ser 2.23; Hemoglobin 13.3; NT-Pro BNP 516; Platelets 186; Potassium 4.5; Sodium 143; TSH 3.150   Recent Lipid  Panel Lab Results  Component Value Date/Time   CHOL 130 05/01/2017 08:27 AM   TRIG 70.0 05/01/2017 08:27 AM   HDL 56.00 05/01/2017 08:27 AM   CHOLHDL 2 05/01/2017 08:27 AM   LDLCALC 60 05/01/2017 08:27 AM    Wt Readings from Last 3 Encounters:  11/04/18 132 lb 8 oz (60.1 kg)  10/20/18 131 lb (59.4 kg)  09/22/18 130 lb (59 kg)     Objective:    Vital Signs:  BP (!) 151/71    Pulse 61    Ht 5\' 6"  (1.676 m)    Wt 132 lb 8 oz (60.1 kg)    BMI 21.39 kg/m    VITAL SIGNS:  reviewed    ASSESSMENT & PLAN:    1.  Dyspnea on exertion -Can be multifactorial, patient has a complicated cardiac history, this seems to be exertional and not related to CHF as he denies any orthopnea proximal nocturnal dyspnea no lower extremity edema and stable weight.  Also has history of CAD last cath in August 2019 showed Prox Cx lesion is 70% stenosed. Ost 1st Mrg lesion is 75% stenosed. Previously placed Mid RCA stent (unknown type) is widely patent. Prox RCA lesion is 40% stenosed. Post Atrio lesion is 75% stenosed. Ost LAD to Prox LAD lesion is 30% stenosed.  This certainly can be progression of his disease, he is not a good Candidate considering CKD stage IV.  If his labs normal and no evidence of CHF or anemia we will plan for a stress test. His most recent labs showed creatinine of 2.2 and BNP 513, I will start him on Lasix 20 mg every other day.  He is encouraged to continue exercising every day.  2. Bioprosthetic aortic valve endocarditis -diagnosed on November 13, 2017, completed 6 weeks course of IV antibiotics and on lifelong Ampicillin.  He takes it with probiotics and has no significant diarrhea.  He has no recurrent fever, no evidence of endocarditis on TEE on March 30, 2018. Stable.   3.  Paroxysmal atrial fibrillation, he is maintaining sinus rhythm, on Eliquis, we will recheck his hemoglobin now.  His hemoglobin 13.3, he is advised to discontinue iron pills.  4.    Acute on chronic combined CHF  with cardiomyopathy - LV function has declined over the last several years.    He is now functional class NYHA IIb.  BNP 513 and starting him on Lasix 20 mg every other day.  5. Sick sinus syndrome, status post pacemaker placement, working normally, followed by Dr. Lovena Le.  COVID-19 Education: The signs and symptoms of COVID-19 were discussed with the patient and how to seek care for testing (follow up with PCP or arrange E-visit).  The importance of social distancing was discussed today.  Time:   Today, I have spent 25 minutes with the patient with telehealth technology discussing the above problems.     Medication Adjustments/Labs and Tests Ordered: Current medicines are reviewed at length with the patient today.  Concerns regarding medicines are outlined above.   Tests Ordered: No orders of the defined types were placed in this encounter.   Medication Changes: No orders of the defined types were placed in this encounter.   Disposition:  Follow up in 4 week(s)  Signed, Ena Dawley, MD  11/04/2018 8:41 AM    [facility]

## 2018-11-04 NOTE — Patient Instructions (Signed)
Medication Instructions:    TAKE LASIX 20 MG BY MOUTH EVERY OTHER DAY, STARTING TODAY.  If you need a refill on your cardiac medications before your next appointment, please call your pharmacy.     Follow-Up:  WITH DR Meda Coffee AS ANOTHER FOLLOW-UP VIRTUAL TELEPHONE VISIT ON Tuesday 12/14/18 AT 9 AM.

## 2018-11-10 ENCOUNTER — Telehealth: Payer: Self-pay | Admitting: Cardiology

## 2018-11-10 ENCOUNTER — Telehealth: Payer: Self-pay

## 2018-11-10 NOTE — Telephone Encounter (Signed)
AP/VS @ 66. AT/AF burden 0.1% since 12/02/17. 5 VHR episodes, available markers suggest 1:1 tachycardia. Longest 5 seconds, most recent 10/28/18. Battery longevity 10 years.

## 2018-11-10 NOTE — Telephone Encounter (Signed)
Device clinic reviewed Pt's last remote check from May.  Very low afib burden at that time.  Device clinic to call Pt to send remote to determine if there has been any change in afib burden.  Will call Pt with results of remote check and determine what further actions are needed.

## 2018-11-10 NOTE — Telephone Encounter (Signed)
F/U Message           Patient was told by Dr. Meda Coffee to call back if he was not feeling well. Patient B/P is running like180/102/ 90/56/ 153/77 114/67 Right now 142/80.

## 2018-11-10 NOTE — Telephone Encounter (Signed)
Transmission received 11/10/2018

## 2018-11-10 NOTE — Telephone Encounter (Signed)
I spoke with the pt and his wife. They agreed to send a manual transmission with his home monitor soon.

## 2018-11-10 NOTE — Telephone Encounter (Signed)
The device clinic reviewed a remote transmission today and his afib burden was 0.1%. He stated his main problem is being fatigued all the time. He can use and exercise bike and swim in a pool without problems but when he uses a walker to go for a walk he can only go about 10 minutes before he has to stop and catch his breath. He said he used to be able to walk for 30-45 minutes without problems. They tried to take Lasix every other day, but when he took the medication his pressure was all of the place, getting up around 461J systolic and going down to the 100s. The wife stopped the medication because every time he took the medication his pressure fluctuated drastically. His pressure currently was 136/72. He has no swelling currently.

## 2018-11-11 NOTE — Telephone Encounter (Signed)
No advice needed, continue current management

## 2018-12-01 ENCOUNTER — Other Ambulatory Visit: Payer: Self-pay | Admitting: Cardiology

## 2018-12-01 DIAGNOSIS — R7989 Other specified abnormal findings of blood chemistry: Secondary | ICD-10-CM

## 2018-12-14 ENCOUNTER — Other Ambulatory Visit: Payer: Self-pay

## 2018-12-14 ENCOUNTER — Telehealth (INDEPENDENT_AMBULATORY_CARE_PROVIDER_SITE_OTHER): Payer: Medicare Other | Admitting: Cardiology

## 2018-12-14 ENCOUNTER — Encounter: Payer: Self-pay | Admitting: Cardiology

## 2018-12-14 VITALS — BP 147/76 | HR 57 | Wt 133.0 lb

## 2018-12-14 DIAGNOSIS — I2510 Atherosclerotic heart disease of native coronary artery without angina pectoris: Secondary | ICD-10-CM

## 2018-12-14 DIAGNOSIS — Z950 Presence of cardiac pacemaker: Secondary | ICD-10-CM

## 2018-12-14 DIAGNOSIS — I4891 Unspecified atrial fibrillation: Secondary | ICD-10-CM

## 2018-12-14 DIAGNOSIS — R5383 Other fatigue: Secondary | ICD-10-CM

## 2018-12-14 DIAGNOSIS — I10 Essential (primary) hypertension: Secondary | ICD-10-CM

## 2018-12-14 DIAGNOSIS — Z952 Presence of prosthetic heart valve: Secondary | ICD-10-CM

## 2018-12-14 MED ORDER — FUROSEMIDE 20 MG PO TABS: 20.0000 mg | ORAL_TABLET | Freq: Every day | ORAL | 3 refills | Status: DC | PRN

## 2018-12-14 NOTE — Addendum Note (Signed)
Addended by: Nuala Alpha on: 12/14/2018 10:01 AM   Modules accepted: Orders

## 2018-12-14 NOTE — Progress Notes (Signed)
Virtual Visit via Video Note   This visit type was conducted due to national recommendations for restrictions regarding the COVID-19 Pandemic (e.g. social distancing) in an effort to limit this patient's exposure and mitigate transmission in our community.  Due to his co-morbid illnesses, this patient is at least at moderate risk for complications without adequate follow up.  This format is felt to be most appropriate for this patient at this time.  All issues noted in this document were discussed and addressed.  A limited physical exam was performed with this format.  Please refer to the patient's chart for his consent to telehealth for [facility].   Evaluation Performed:  Follow-up visit  Date:  12/14/2018   ID:  Ryan [name], DOB [id number], MRN [id number]  Patient Location: Home Provider Location: Home  PCP:  Dorothyann Peng, NP  Cardiologist:  Ena Dawley, MD  Electrophysiologist:  Dr Lovena Le  Chief Complaint:  fatigue  History of Present Illness:    Ryan [name] is a 85 y.o. 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 He was doing better until 3 daysprior to [place] he developed worsening left low back pain and profound fatigue.  He was admitted on November 07, 2017 and diagnosed with eterococcal bacteremia withprosthetic valve endocarditis.  There was a large vegetation seen on bioprosthetic aortic valve, no vegetation was seen on pacemaker leads.Patient has been on IV  antibiotics IV ampicillin and Rocephin for the endocarditis. Patient was discharged to skilled nursing facility to complete course of antibiotics, completed on December 22, 2017 During the hospital stay, the patient developed an acute kidney injury believed to be secondary to dehydration and possible endocarditis embolization. Patient's intractable back pain was further evaluated, CT showed spondylosis, mild to moderate multilevel foraminal stenosis, pars defect L 4-5.   Neurosurgery reassessed on 6/11 and indicatedthat his acute onset of back pain is due to 1 of 3 possibilities: Exacerbation of chronic lumbar spondylosis, spontaneous spinal EDH related to Eliquis or discitis. He recommends nonoperative treatment, continue antibiotics for his bacteremia and if stroke risk felt to be high without Eliquis then consider resuming it.   01/27/2018 -the patient was readmitted on January 15, 2018 for complaint of like of energy and chest pain with elevated troponin.  Coronary CTA on August 16 showed widely patent mid RCA stent the remainder of anatomy and change, the etiology of his chest pain is not supported by any change in his coronary anatomy and medical therapy was recommended.  He was treated for acute combined systolic and diastolic heart failure.  Discharged on August 20 has 2019. Today he states that he has more energy, he finally feels little better, alternates exercises with naps during the day, denies any chest pain other than heartburn at night, no lower extremity edema, no orthopnea, paroxmal nocturnal dyspnea no fevers.  04/20/2018 -patient was readmitted on October 28 with recurrent fevers, he has been started on IV ampicillin and Rocephin, his leukocytosis was improved and he appears nontoxic, TEE did not show any recurrent aortic valve endocarditis.  He  is blood cultures are negative.  He received PICC line to his right arm on April 01, 2018 and will receive IV penicillin with Rocephin for 6  weeks.  He is supposed to follow with ID service as well.  May 14, 2018 -the patient is coming after 4 weeks, he has been doing well he completed his 6-week course of  IV antibiotics and is on lifelong therapy with amoxicillin.  He is also better than he has felt in a long time he denies any fever or chills, no palpitations chest pain, no lower extremity edema orthopnea or paroxysmal nocturnal dyspnea.  No bleeding.  06/17/2018 -this is 4 weeks follow-up the patient feels and looks great, he states he feels tired however he still able to perform 1 hour and stationary bike, use rowing machine and walk for about 20 minutes a day.  He has a lot of interruptions and he sleeps but takes naps during the day so overall he gets 8 hours of sleep.  He denies any fever or chills.  He now uses Lasix only occasionally as needed as his lower extremity edema has resolved.  He use compression socks.  He denies any chest pain and overall just feels really well right now.  11/04/2018 - 2 weeks follow up, he stays that his exercise capacity is decreasing with walking, last Saturday he had an episode of chest pain radiating to his neck relieved by nitroglycerin in less than 5 minutes.  He continues to be active, exercising for an hour every day he has no problem when he is on a rowing machine or stationary bike and only gets short of breath when he is walking.  His blood pressure has been slightly elevated and he had admits to eating some extra salt in his diet.  12/13/2018 -this is 1 months follow-up, the patient reports he has been doing great, his fatigue is slightly improved, he used to take 3 naps a day now only 1 nap a day he continues to walk in his pool without any chest pain or shortness of breath.  He stopped using Lasix every other day as his lower extremity edema has resolved and now uses it only PRN.  No palpitations and no fevers.  The patient does not have symptoms concerning for COVID-19 infection (fever,  chills, cough, or new shortness of breath).   Past Medical History:  Diagnosis Date  . Age-related macular degeneration   . Allergic rhinitis   . 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 Anemia 12/2017   normocytic.  iron deficient, ferritin 17 04/2018  . Arthritis    "hands, lower back" (04/30/2016)  . Asthma   . Bacterial endocarditis   . Carotid artery obstruction    a. s/p bilat CEA.  . Chronic combined systolic (congestive) and diastolic (congestive) heart failure ([place]) 03/18/2016  . Chronic lower back pain   . CKD (chronic kidney disease) stage 4, GFR 15-29 ml/min (HCC) 12/2017  . Claudication in peripheral vascular disease ([place]) 01/01/2018   Claudication  . Coronary arteriosclerosis in native artery    a. 2017 Cardiac catheterization demonstrated worsening CAD with 70% mid LCx, 80% OM1 and 80% mid RCA stenosis. b.  In 11/17, he underwent successful rotational atherectomy and DES to RCA.  Marland Kitchen Decreased diffusion capacity 10/23/2017  . Diverticulitis of colon   . DJD (degenerative joint disease)   . Elevated TSH 06/30/2017  . GERD (gastroesophageal reflux disease)   . History of hiatal hernia   . HLD (hyperlipidemia)   .  Hypertension   . Hypertensive heart disease with heart failure ([place])   . Intractable back pain 11/07/2017  . OSA (obstructive sleep apnea)    intolerant to CPAP  . PAF (paroxysmal atrial fibrillation) ([place])   . Paroxysmal supraventricular tachycardia ([place])   . Presence of permanent cardiac pacemaker   . Primary malignant neoplasm of bladder ([place])   . Prostate cancer ([place])   . Prosthetic valve endocarditis ([place]) 11/10/2017   enterococcal bacteremia  . S/P AVR 12/02/2017  . Syncope 10/23/2017  . Tachy-brady syndrome ([place])   . Thrombocytopenia ([place]) 11/07/2017  . Urinary retention 11/07/2017   Past Surgical History:  Procedure Laterality Date  . APPENDECTOMY    . CARDIAC CATHETERIZATION N/A 01/17/2016   Procedure: Right/Left Heart Cath and Coronary Angiography;   Surgeon: Belva Crome, MD;  Location: [place] [facility];  Service: Cardiovascular;  Laterality: N/A;  . CARDIAC CATHETERIZATION N/A 04/30/2016   Procedure: Coronary/Graft Atherectomy;  Surgeon: Sherren Mocha, MD;  Location: [place] [facility];  Service: Cardiovascular;  Laterality: N/A;  . CAROTID ENDARTERECTOMY Bilateral   . CATARACT EXTRACTION W/ INTRAOCULAR LENS  IMPLANT, BILATERAL Bilateral   . COLON SURGERY  1981   Meckles Diverticulum with volvulus  . COLONOSCOPY WITH PROPOFOL N/A 05/01/2018   Procedure: COLONOSCOPY WITH PROPOFOL;  Surgeon: Rush Landmark Telford Nab., MD;  Location: [place];  Service: Gastroenterology;  Laterality: N/A;  . CORONARY ANGIOGRAPHY N/A 01/18/2018   Procedure: CORONARY ANGIOGRAPHY;  Surgeon: Lorretta Harp, MD;  Location: [place] [facility];  Service: Cardiovascular;  Laterality: N/A;  . ENTEROSCOPY N/A 04/30/2018   Procedure: ENTEROSCOPY;  Surgeon: Rush Landmark Telford Nab., MD;  Location: [place];  Service: Gastroenterology;  Laterality: N/A;  . EP IMPLANTABLE DEVICE N/A 03/21/2016   Procedure: Pacemaker Implant;  Surgeon: Evans Lance, MD;  Location: [place] [facility];  Service: Cardiovascular;  Laterality: N/A;  . EYE SURGERY    . INGUINAL HERNIA REPAIR Right 2009  . INSERT / REPLACE / REMOVE PACEMAKER    . INSERTION PROSTATE RADIATION SEED    . POLYPECTOMY  05/01/2018   Procedure: POLYPECTOMY;  Surgeon: Mansouraty, Telford Nab., MD;  Location: [place];  Service: Gastroenterology;;  . TEE WITHOUT CARDIOVERSION N/A 06/10/2016   Procedure: TRANSESOPHAGEAL ECHOCARDIOGRAM (TEE);  Surgeon: Sherren Mocha, MD;  Location: [place];  Service: Open Heart Surgery;  Laterality: N/A;  . TEE WITHOUT CARDIOVERSION N/A 11/10/2017   Procedure: TRANSESOPHAGEAL ECHOCARDIOGRAM (TEE);  Surgeon: Acie Fredrickson Wonda Cheng, MD;  Location: [facility] ENDOS[facility];  Service: Cardiovascular;  Laterality: N/A;  . TEE WITHOUT CARDIOVERSION N/A 12/15/2017   Procedure: TRANSESOPHAGEAL  ECHOCARDIOGRAM (TEE);  Surgeon: Buford Dresser, MD;  Location: [facility] ENDOSCOPY;  Service: Cardiovascular;  Laterality: N/A;  . TEE WITHOUT CARDIOVERSION N/A 03/30/2018   Procedure: TRANSESOPHAGEAL ECHOCARDIOGRAM (TEE);  Surgeon: Jerline Pain, MD;  Location: [facility] ENDOSCOPY;  Service: Cardiovascular;  Laterality: N/A;  . TONSILLECTOMY  ~ 1947  . TRANSCATHETER AORTIC VALVE REPLACEMENT, TRANSFEMORAL N/A 06/10/2016   Procedure: TRANSCATHETER AORTIC VALVE REPLACEMENT, TRANSFEMORAL;  Surgeon: Sherren Mocha, MD;  Location: [place];  Service: Open Heart Surgery;  Laterality: N/A;     Current Meds  Medication Sig  . acetaminophen-codeine (TYLENOL #3) 300-30 MG tablet Take 0.5-1 tablets by mouth every 4 (four) hours as needed for moderate pain.  Marland Kitchen albuterol (VENTOLIN HFA) 108 (90 Base) MCG/ACT inhaler Inhale 2 puffs into the lungs every 6 (six) hours as needed for wheezing or shortness of breath.  Marland Kitchen amLODipine (NORVASC) 5 MG tablet Take  1 tablet (5 mg total) by mouth daily.  Marland Kitchen amoxicillin (AMOXIL) 500 MG tablet Take 1 tablet (500 mg total) by mouth 2 (two) times daily.  Marland Kitchen apixaban (ELIQUIS) 2.5 MG TABS tablet Take 1 tablet (2.5 mg total) by mouth 2 (two) times daily.  Marland Kitchen atorvastatin (LIPITOR) 40 MG tablet Take 1 tablet (40 mg total) by mouth daily at 6 PM.  . cholecalciferol (VITAMIN D) 1000 units tablet Take 1,000 Units by mouth 2 (two) times a week.   . isosorbide mononitrate (IMDUR) 60 MG 24 hr tablet Take 1 tablet (60 mg total) by mouth daily.  Marland Kitchen latanoprost (XALATAN) 0.005 % ophthalmic solution Place 1 drop into both eyes at bedtime.  Marland Kitchen levothyroxine (SYNTHROID) 25 MCG tablet TAKE 1/2 TABLET BY MOUTH EVERY DAY BEFORE BREAKFAST  . metoprolol succinate (TOPROL-XL) 100 MG 24 hr tablet Take 1 tablet (100 mg total) by mouth daily. Take with or immediately following a meal.  . Netarsudil Dimesylate (RHOPRESSA) 0.02 % SOLN Place 1 drop into both eyes at bedtime.  . nitroGLYCERIN (NITROSTAT) 0.4 MG SL  tablet Place 1 tablet (0.4 mg total) under the tongue every 5 (five) minutes x 3 doses as needed for chest pain.  . pantoprazole (PROTONIX) 40 MG tablet Take 40 mg by mouth as needed.  . polyethylene glycol (MIRALAX / GLYCOLAX) 17 g packet Take 17 g by mouth daily as needed.  Marland Kitchen UNABLE TO FIND Take 1 tablet by mouth daily. Megafood blood builder vit c, folate, b12, iron and beet root.  . vitamin B-12 (CYANOCOBALAMIN) 1000 MCG tablet Take 1,000 mcg by mouth daily.     Allergies:   Ciprofloxacin, Hydrochlorothiazide, Sulfa antibiotics, Ace inhibitors, Losartan, Carvedilol, Lisinopril, and Soy allergy   Social History   Tobacco Use  . Smoking status: Never Smoker  . Smokeless tobacco: Never Used  Substance Use Topics  . Alcohol use: No    Alcohol/week: 0.0 standard drinks    Comment: 04/30/2016 "nothing in years"  . Drug use: No     Family Hx: The patient's family history includes Heart disease in his father and mother.  ROS:   Please see the history of present illness.    All other systems reviewed and are negative.   Prior CV studies:   The following studies were reviewed today:  Labs/Other Tests and Data Reviewed:    EKG:  No ECG reviewed.  Recent Labs: 03/31/2018: Magnesium 2.1 10/21/2018: ALT 13; BUN 52; Creatinine, Ser 2.23; Hemoglobin 13.3; NT-Pro BNP 516; Platelets 186; Potassium 4.5; Sodium 143; TSH 3.150   Recent Lipid Panel Lab Results  Component Value Date/Time   CHOL 130 05/01/2017 08:27 AM   TRIG 70.0 05/01/2017 08:27 AM   HDL 56.00 05/01/2017 08:27 AM   CHOLHDL 2 05/01/2017 08:27 AM   LDLCALC 60 05/01/2017 08:27 AM    Wt Readings from Last 3 Encounters:  12/14/18 133 lb (60.3 kg)  11/04/18 132 lb 8 oz (60.1 kg)  10/20/18 131 lb (59.4 kg)     Objective:    Vital Signs:  BP (!) 147/76   Pulse (!) 57   Wt 133 lb (60.3 kg)   BMI 21.47 kg/m    VITAL SIGNS:  reviewed    ASSESSMENT & PLAN:    1.  Fatigue -Multifactorial secondary to multiple  comorbidities including acute on chronic CHF, status post aortic valve replacement, age, deconditioning, coronary artery disease. -However patient is good as he has been in a long time he is encouraged to continue  exercising daily he is very motivated to do so.  2.  CAD -Stable -last cath in August 2019 showed Prox Cx lesion is 70% stenosed. Ost 1st Mrg lesion is 75% stenosed. Previously placed Mid RCA stent (unknown type) is widely patent. Prox RCA lesion is 40% stenosed. Post Atrio lesion is 75% stenosed. Ost LAD to Prox LAD lesion is 30% stenosed.  3. Bioprosthetic aortic valve endocarditis -diagnosed on November 13, 2017, completed 6 weeks course of IV antibiotics and on lifelong Ampicillin.  He takes it with probiotics and has no significant diarrhea.  He has no recurrent fever, no evidence of endocarditis on TEE on March 30, 2018.   4.  Paroxysmal atrial fibrillation, he is maintaining sinus rhythm, on Eliquis, hemoglobin is 13, he has no bleeding, on most recent pacemaker interrogation his A. fib burden is less than 0.1%.  5.    Acute on chronic combined CHF with cardiomyopathy - LV function has declined over the last several years.    He is now functional class NYHA IIb.  BNP 513, volume status improved on Lasix 20 mg every other day, we will switch to as needed.   6. Sick sinus syndrome, status post pacemaker placement, working normally, followed by Dr. Lovena Le.  COVID-19 Education: The signs and symptoms of COVID-19 were discussed with the patient and how to seek care for testing (follow up with PCP or arrange E-visit).  The importance of social distancing was discussed today.  Time:   Today, I have spent 25 minutes with the patient with telehealth technology discussing the above problems.     Medication Adjustments/Labs and Tests Ordered: Current medicines are reviewed at length with the patient today.  Concerns regarding medicines are outlined above.   Tests Ordered: No orders of  the defined types were placed in this encounter.   Medication Changes: No orders of the defined types were placed in this encounter.   Disposition:  Follow up in 4 week(s)  Signed, Ena Dawley, MD  12/14/2018 9:46 AM    [facility]

## 2018-12-14 NOTE — Patient Instructions (Addendum)
Medication Instructions:   START TAKING YOUR LASIX 20 MG BY MOUTH DAILY ONLY AS NEEDED FOR LOWER EXTREMITY SWELLING OR FLUID RETENTION  If you need a refill on your cardiac medications before your next appointment, please call your pharmacy.     Follow-Up:  AS ANOTHER FOLLOW-UP TELEPHONE VISIT ON March 16, 2019 AT 9 AM

## 2018-12-21 ENCOUNTER — Other Ambulatory Visit: Payer: Self-pay | Admitting: Internal Medicine

## 2018-12-22 NOTE — Telephone Encounter (Signed)
Pt last saw Dr Meda Coffee telemedicine 12/14/18 Covid-19, last labs 10/21/18 Creat 2.23, age 85, weight 60.3kg, based on specified criteria pt is on appropriate dosage of Eliquis 2.5mg  BID.  Will refill rx.

## 2019-01-10 ENCOUNTER — Ambulatory Visit (INDEPENDENT_AMBULATORY_CARE_PROVIDER_SITE_OTHER): Payer: Medicare Other | Admitting: *Deleted

## 2019-01-10 DIAGNOSIS — I495 Sick sinus syndrome: Secondary | ICD-10-CM | POA: Diagnosis not present

## 2019-01-10 DIAGNOSIS — R55 Syncope and collapse: Secondary | ICD-10-CM

## 2019-01-10 LAB — CUP PACEART REMOTE DEVICE CHECK
Battery Impedance: 210 Ohm
Battery Remaining Longevity: 117 mo
Battery Voltage: 2.8 V
Brady Statistic AP VP Percent: 0 %
Brady Statistic AP VS Percent: 97 %
Brady Statistic AS VP Percent: 0 %
Brady Statistic AS VS Percent: 3 %
Date Time Interrogation Session: 20200810170856
Implantable Lead Implant Date: 20171020
Implantable Lead Implant Date: 20171020
Implantable Lead Location: 753859
Implantable Lead Location: 753860
Implantable Lead Model: 5076
Implantable Lead Model: 5076
Implantable Pulse Generator Implant Date: 20171020
Lead Channel Impedance Value: 465 Ohm
Lead Channel Impedance Value: 588 Ohm
Lead Channel Pacing Threshold Amplitude: 0.625 V
Lead Channel Pacing Threshold Amplitude: 0.625 V
Lead Channel Pacing Threshold Pulse Width: 0.4 ms
Lead Channel Pacing Threshold Pulse Width: 0.4 ms
Lead Channel Setting Pacing Amplitude: 2 V
Lead Channel Setting Pacing Amplitude: 2.5 V
Lead Channel Setting Pacing Pulse Width: 0.4 ms
Lead Channel Setting Sensing Sensitivity: 5.6 mV

## 2019-01-18 NOTE — Progress Notes (Signed)
Remote pacemaker transmission.   

## 2019-01-28 DIAGNOSIS — L570 Actinic keratosis: Secondary | ICD-10-CM | POA: Diagnosis not present

## 2019-01-28 DIAGNOSIS — Z85828 Personal history of other malignant neoplasm of skin: Secondary | ICD-10-CM | POA: Diagnosis not present

## 2019-01-28 DIAGNOSIS — L820 Inflamed seborrheic keratosis: Secondary | ICD-10-CM | POA: Diagnosis not present

## 2019-01-28 DIAGNOSIS — D485 Neoplasm of uncertain behavior of skin: Secondary | ICD-10-CM | POA: Diagnosis not present

## 2019-02-21 DIAGNOSIS — H353131 Nonexudative age-related macular degeneration, bilateral, early dry stage: Secondary | ICD-10-CM | POA: Diagnosis not present

## 2019-02-21 DIAGNOSIS — H401223 Low-tension glaucoma, left eye, severe stage: Secondary | ICD-10-CM | POA: Diagnosis not present

## 2019-02-21 DIAGNOSIS — H401213 Low-tension glaucoma, right eye, severe stage: Secondary | ICD-10-CM | POA: Diagnosis not present

## 2019-02-21 DIAGNOSIS — H26493 Other secondary cataract, bilateral: Secondary | ICD-10-CM | POA: Diagnosis not present

## 2019-03-07 DIAGNOSIS — H18003 Unspecified corneal deposit, bilateral: Secondary | ICD-10-CM | POA: Diagnosis not present

## 2019-03-07 DIAGNOSIS — H26493 Other secondary cataract, bilateral: Secondary | ICD-10-CM | POA: Diagnosis not present

## 2019-03-07 DIAGNOSIS — H401213 Low-tension glaucoma, right eye, severe stage: Secondary | ICD-10-CM | POA: Diagnosis not present

## 2019-03-07 DIAGNOSIS — H401223 Low-tension glaucoma, left eye, severe stage: Secondary | ICD-10-CM | POA: Diagnosis not present

## 2019-03-16 ENCOUNTER — Other Ambulatory Visit: Payer: Self-pay

## 2019-03-16 ENCOUNTER — Telehealth (INDEPENDENT_AMBULATORY_CARE_PROVIDER_SITE_OTHER): Payer: Medicare Other | Admitting: Cardiology

## 2019-03-16 ENCOUNTER — Encounter: Payer: Self-pay | Admitting: Cardiology

## 2019-03-16 VITALS — BP 141/80 | HR 56 | Wt 132.0 lb

## 2019-03-16 DIAGNOSIS — R5383 Other fatigue: Secondary | ICD-10-CM

## 2019-03-16 DIAGNOSIS — Z952 Presence of prosthetic heart valve: Secondary | ICD-10-CM | POA: Diagnosis not present

## 2019-03-16 DIAGNOSIS — I480 Paroxysmal atrial fibrillation: Secondary | ICD-10-CM

## 2019-03-16 DIAGNOSIS — I2510 Atherosclerotic heart disease of native coronary artery without angina pectoris: Secondary | ICD-10-CM

## 2019-03-16 DIAGNOSIS — I5043 Acute on chronic combined systolic (congestive) and diastolic (congestive) heart failure: Secondary | ICD-10-CM | POA: Diagnosis not present

## 2019-03-16 NOTE — Progress Notes (Signed)
Virtual Visit via Video Note   This visit type was conducted due to national recommendations for restrictions regarding the COVID-19 Pandemic (e.g. social distancing) in an effort to limit this patient's exposure and mitigate transmission in our community.  Due to his co-morbid illnesses, this patient is at least at moderate risk for complications without adequate follow up.  This format is felt to be most appropriate for this patient at this time.  All issues noted in this document were discussed and addressed.  A limited physical exam was performed with this format.  Please refer to the patient's chart for his consent to telehealth for [facility].   Evaluation Performed:  Follow-up visit  Date:  03/16/2019   ID:  Ryan [name], DOB Jan 06, 1930, MRN [id number]  Patient Location: Home Provider Location: Home  PCP:  Dorothyann Peng, NP  Cardiologist:  Ena Dawley, MD  Electrophysiologist:  Dr Lovena Le  Chief Complaint:  fatigue  History of Present Illness:    Ryan [name] is a 85 y.o. 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 He was doing better until 3 daysprior to Rule he developed worsening left low back pain and profound fatigue.  He was admitted on November 07, 2017 and diagnosed with eterococcal bacteremia withprosthetic valve endocarditis.  There was a large vegetation seen on bioprosthetic aortic valve, no vegetation was seen on pacemaker leads.Patient has been on IV  antibiotics IV ampicillin and Rocephin for the endocarditis. Patient was discharged to skilled nursing facility to complete course of antibiotics, completed on December 22, 2017 During the hospital stay, the patient developed an acute kidney injury believed to be secondary to dehydration and possible endocarditis embolization. Patient's intractable back pain was further evaluated, CT showed spondylosis, mild to moderate multilevel foraminal stenosis, pars defect L 4-5.   Neurosurgery reassessed on 6/11 and indicatedthat his acute onset of back pain is due to 1 of 3 possibilities: Exacerbation of chronic lumbar spondylosis, spontaneous spinal EDH related to Eliquis or discitis. He recommends nonoperative treatment, continue antibiotics for his bacteremia and if stroke risk felt to be high without Eliquis then consider resuming it.   01/27/2018 -the patient was readmitted on January 15, 2018 for complaint of like of energy and chest pain with elevated troponin.  Coronary CTA on August 16 showed widely patent mid RCA stent the remainder of anatomy and change, the etiology of his chest pain is not supported by any change in his coronary anatomy and medical therapy was recommended.  He was treated for acute combined systolic and diastolic heart failure.  Discharged on August 20 has 2019. Today he states that he has more energy, he finally feels little better, alternates exercises with naps during the day, denies any chest pain other than heartburn at night, no lower extremity edema, no orthopnea, paroxmal nocturnal dyspnea no fevers.  04/20/2018 -patient was readmitted on October 28 with recurrent fevers, he has been started on IV ampicillin and Rocephin, his leukocytosis was improved and he appears nontoxic, TEE did not show any recurrent aortic valve endocarditis.  He  is blood cultures are negative.  He received PICC line to his right arm on April 01, 2018 and will receive IV penicillin with Rocephin for 6  weeks.  He is supposed to follow with ID service as well.  May 14, 2018 -the patient is coming after 4 weeks, he has been doing well he completed his 6-week course of  IV antibiotics and is on lifelong therapy with amoxicillin.  He is also better than he has felt in a long time he denies any fever or chills, no palpitations chest pain, no lower extremity edema orthopnea or paroxysmal nocturnal dyspnea.  No bleeding.  06/17/2018 -this is 4 weeks follow-up the patient feels and looks great, he states he feels tired however he still able to perform 1 hour and stationary bike, use rowing machine and walk for about 20 minutes a day.  He has a lot of interruptions and he sleeps but takes naps during the day so overall he gets 8 hours of sleep.  He denies any fever or chills.  He now uses Lasix only occasionally as needed as his lower extremity edema has resolved.  He use compression socks.  He denies any chest pain and overall just feels really well right now.  12/13/2018 -this is 1 months follow-up, the patient reports he has been doing great, his fatigue is slightly improved, he used to take 3 naps a day now only 1 nap a day he continues to walk in his pool without any chest pain or shortness of breath.  He stopped using Lasix every other day as his lower extremity edema has resolved and now uses it only PRN.  No palpitations and no fevers.  03/16/2019 -the patient with the same symptoms of fatigue and taking naps during the day but then inability to sleep at night.  However considering his age and diagnosis is doing incredibly well.  He is able to bike and walk daily and do push-ups against the wall.  He has very minimal lower extremity edema for which he drinks on Lasix however very occasionally.  He has occasional dizziness but no falls.  He states that he feels like the more short of breath.  The patient does not have symptoms concerning for COVID-19 infection (fever, chills, cough, or new shortness  of breath).   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  iron deficient, ferritin 17 04/2018   Arthritis    "hands, lower back" (04/30/2016)   Asthma    Bacterial endocarditis    Carotid artery obstruction    a. s/p bilat CEA.   Chronic combined systolic (congestive) and diastolic (congestive) heart failure (HCC) 03/18/2016   Chronic lower back pain    CKD (chronic kidney disease) stage 4, GFR 15-29 ml/min (HCC) 12/2017   Claudication in peripheral vascular disease ([place]) 01/01/2018   Claudication   Coronary arteriosclerosis in native artery    a. 2017 Cardiac catheterization demonstrated worsening CAD with 70% mid LCx, 80% OM1 and 80% mid RCA stenosis. b.  In 11/17, he underwent successful rotational atherectomy and DES to RCA.   Decreased diffusion capacity 10/23/2017   Diverticulitis of colon    DJD (degenerative joint disease)    Elevated TSH 06/30/2017   GERD (gastroesophageal reflux disease)    History of hiatal hernia    HLD (hyperlipidemia)    Hypertension    Hypertensive  heart disease with heart failure (HCC)    Intractable back pain 11/07/2017   OSA (obstructive sleep apnea)    intolerant to CPAP   PAF (paroxysmal atrial fibrillation) (HCC)    Paroxysmal supraventricular tachycardia (HCC)    Presence of permanent cardiac pacemaker    Primary malignant neoplasm of bladder [facility])    Prostate cancer ([place])    Prosthetic valve endocarditis ([place]) 11/10/2017   enterococcal bacteremia   S/P AVR 12/02/2017   Syncope 10/23/2017   Tachy-brady syndrome ([place])    Thrombocytopenia ([name]) 11/07/2017   Urinary retention 11/07/2017   Past Surgical History:  Procedure Laterality Date   APPENDECTOMY     CARDIAC CATHETERIZATION N/A 01/17/2016   Procedure: Right/Left Heart Cath and Coronary Angiography;  Surgeon: Belva Crome, MD;   Location: [place] CV LAB;  Service: Cardiovascular;  Laterality: N/A;   CARDIAC CATHETERIZATION N/A 04/30/2016   Procedure: Coronary/Graft Atherectomy;  Surgeon: Sherren Mocha, MD;  Location: [place] CV LAB;  Service: Cardiovascular;  Laterality: N/A;   CAROTID ENDARTERECTOMY Bilateral    CATARACT EXTRACTION W/ INTRAOCULAR LENS  IMPLANT, BILATERAL Bilateral    COLON SURGERY  1981   Meckles Diverticulum with volvulus   COLONOSCOPY WITH PROPOFOL N/A 05/01/2018   Procedure: COLONOSCOPY WITH PROPOFOL;  Surgeon: Rush Landmark Telford Nab., MD;  Location: [place];  Service: Gastroenterology;  Laterality: N/A;   CORONARY ANGIOGRAPHY N/A 01/18/2018   Procedure: CORONARY ANGIOGRAPHY;  Surgeon: Lorretta Harp, MD;  Location: [place] CV [facility];  Service: Cardiovascular;  Laterality: N/A;   ENTEROSCOPY N/A 04/30/2018   Procedure: ENTEROSCOPY;  Surgeon: Rush Landmark Telford Nab., MD;  Location: [place];  Service: Gastroenterology;  Laterality: N/A;   EP IMPLANTABLE DEVICE N/A 03/21/2016   Procedure: Pacemaker Implant;  Surgeon: Evans Lance, MD;  Location: [place] CV LAB;  Service: Cardiovascular;  Laterality: N/A;   EYE SURGERY     INGUINAL HERNIA REPAIR Right 2009   INSERT / REPLACE / REMOVE PACEMAKER     INSERTION PROSTATE RADIATION SEED     POLYPECTOMY  05/01/2018   Procedure: POLYPECTOMY;  Surgeon: Rush Landmark Telford Nab., MD;  Location: [place];  Service: Gastroenterology;;   TEE WITHOUT CARDIOVERSION N/A 06/10/2016   Procedure: TRANSESOPHAGEAL ECHOCARDIOGRAM (TEE);  Surgeon: Sherren Mocha, MD;  Location: [place];  Service: Open Heart Surgery;  Laterality: N/A;   TEE WITHOUT CARDIOVERSION N/A 11/10/2017   Procedure: TRANSESOPHAGEAL ECHOCARDIOGRAM (TEE);  Surgeon: Acie Fredrickson Wonda Cheng, MD;  Location[facility];  Service: Cardiovascular;  Laterality: N/A;   TEE WITHOUT CARDIOVERSION N/A 12/15/2017   Procedure: TRANSESOPHAGEAL ECHOCARDIOGRAM (TEE);  Surgeon:  Buford Dresser, MD;  Location: [facility] ENDOSCOPY;  Service: Cardiovascular;  Laterality: N/A;   TEE WITHOUT CARDIOVERSION N/A 03/30/2018   Procedure: TRANSESOPHAGEAL ECHOCARDIOGRAM (TEE);  Surgeon: Jerline Pain, MD;  Location: [facility] ENDOSCOPY;  Service: Cardiovascular;  Laterality: N/A;   TONSILLECTOMY  ~ 1947   TRANSCATHETER AORTIC VALVE REPLACEMENT, TRANSFEMORAL N/A 06/10/2016   Procedure: TRANSCATHETER AORTIC VALVE REPLACEMENT, TRANSFEMORAL;  Surgeon: Sherren Mocha, MD;  Location: [place];  Service: Open Heart Surgery;  Laterality: N/A;     Current Meds  Medication Sig   albuterol (VENTOLIN HFA) 108 (90 Base) MCG/ACT inhaler Inhale 2 puffs into the lungs every 6 (six) hours as needed for wheezing or shortness of breath.   amLODipine (NORVASC) 5 MG tablet Take 1 tablet (5 mg total) by mouth daily.   amoxicillin (AMOXIL) 500 MG tablet Take 1 tablet (500 mg total) by mouth 2 (two) times daily.  atorvastatin (LIPITOR) 40 MG tablet Take 1 tablet (40 mg total) by mouth daily at 6 PM.   cholecalciferol (VITAMIN D) 1000 units tablet Take 1,000 Units by mouth 2 (two) times a week.    Coenzyme Q10 (CO Q 10) 100 MG CAPS Take 200 mg by mouth daily.   ELIQUIS 2.5 MG TABS tablet TAKE 1 TABLET(2.5 MG) BY MOUTH TWICE DAILY   furosemide (LASIX) 20 MG tablet Take 1 tablet (20 mg total) by mouth daily as needed for fluid or edema.   isosorbide mononitrate (IMDUR) 60 MG 24 hr tablet Take 1 tablet (60 mg total) by mouth daily.   latanoprost (XALATAN) 0.005 % ophthalmic solution Place 1 drop into both eyes at bedtime.   levothyroxine (SYNTHROID) 25 MCG tablet TAKE 1/2 TABLET BY MOUTH EVERY DAY BEFORE BREAKFAST   metoprolol succinate (TOPROL-XL) 100 MG 24 hr tablet Take 1 tablet (100 mg total) by mouth daily. Take with or immediately following a meal.   Netarsudil Dimesylate (RHOPRESSA) 0.02 % SOLN Place 1 drop into both eyes at bedtime.   vitamin B-12 (CYANOCOBALAMIN) 1000 MCG tablet Take 1,000  mcg by mouth daily.     Allergies:   Ciprofloxacin, Hydrochlorothiazide, Sulfa antibiotics, Ace inhibitors, Losartan, Carvedilol, Lisinopril, and Soy allergy   Social History   Tobacco Use   Smoking status: Never Smoker   Smokeless tobacco: Never Used  Substance Use Topics   Alcohol use: No    Alcohol/week: 0.0 standard drinks    Comment: 04/30/2016 "nothing in years"   Drug use: No     Family Hx: The patient's family history includes Heart disease in his father and mother.  ROS:   Please see the history of present illness.    All other systems reviewed and are negative.   Prior CV studies:   The following studies were reviewed today:  Labs/Other Tests and Data Reviewed:    EKG:  No ECG reviewed.  Recent Labs: 03/31/2018: Magnesium 2.1 10/21/2018: ALT 13; BUN 52; Creatinine, Ser 2.23; Hemoglobin 13.3; NT-Pro BNP 516; Platelets 186; Potassium 4.5; Sodium 143; TSH 3.150   Recent Lipid Panel Lab Results  Component Value Date/Time   CHOL 130 05/01/2017 08:27 AM   TRIG 70.0 05/01/2017 08:27 AM   HDL 56.00 05/01/2017 08:27 AM   CHOLHDL 2 05/01/2017 08:27 AM   LDLCALC 60 05/01/2017 08:27 AM    Wt Readings from Last 3 Encounters:  03/16/19 132 lb (59.9 kg)  12/14/18 133 lb (60.3 kg)  11/04/18 132 lb 8 oz (60.1 kg)     Objective:    Vital Signs:  BP (!) 141/80    Pulse (!) 56    Wt 132 lb (59.9 kg)    BMI 21.31 kg/m    VITAL SIGNS:  reviewed    ASSESSMENT & PLAN:    1.  Fatigue -Multifactorial secondary to multiple comorbidities including acute on chronic CHF, status post aortic valve replacement, age, deconditioning, coronary artery disease. -However patient is good as he has been in a long time he is encouraged to continue exercising daily he is very motivated to do so.  2.  CAD -The patient states that he has mildly worsened shortness of breath however no chest pain, he is able to exercise daily, given CKD stage IV who is not a candidate for cardiac  cath, will continue conservative management. -last cath in August 2019 showed Prox Cx lesion is 70% stenosed. Ost 1st Mrg lesion is 75% stenosed. Previously placed Mid RCA stent (unknown type)  is widely patent. Prox RCA lesion is 40% stenosed. Post Atrio lesion is 75% stenosed. Ost LAD to Prox LAD lesion is 30% stenosed.  3. Bioprosthetic aortic valve endocarditis -diagnosed on November 13, 2017, completed 6 weeks course of IV antibiotics and on lifelong Ampicillin.  He takes it with probiotics and has no significant diarrhea.  He has no recurrent fever, no evidence of endocarditis on TEE on March 30, 2018.  He is going to see an ID specialist next week.  4.  Paroxysmal atrial fibrillation, he is maintaining sinus rhythm, on Eliquis, hemoglobin is 13, he has no bleeding, on most recent pacemaker interrogation his A. fib burden is less than 0.1%.  5.    Acute on chronic combined CHF with cardiomyopathy - LV function has declined over the last several years.    He is now functional class NYHA IIb.  BNP 513, volume status improved on Lasix 20 mg every other day, we will switch to as needed.   6. Sick sinus syndrome, status post pacemaker placement, working normally, followed by Dr. Lovena Le.  I would want to see this patient in person however he does not feel comfortable given call with, I totally understand and agree, we will see him again virtually, and obtain labs next time he is willing to come.  COVID-19 Education: The signs and symptoms of COVID-19 were discussed with the patient and how to seek care for testing (follow up with PCP or arrange E-visit).  The importance of social distancing was discussed today.  Time:   Today, I have spent 25 minutes with the patient with telehealth technology discussing the above problems.     Medication Adjustments/Labs and Tests Ordered: Current medicines are reviewed at length with the patient today.  Concerns regarding medicines are outlined above.   Tests  Ordered: No orders of the defined types were placed in this encounter.   Medication Changes: No orders of the defined types were placed in this encounter.   Disposition:  Follow up in 4 week(s)  Signed, Ena Dawley, MD  03/16/2019 9:44 AM    [facility]

## 2019-03-16 NOTE — Patient Instructions (Addendum)
Medication Instructions:   Your physician recommends that you continue on your current medications as directed. Please refer to the Current Medication list given to you today.  If you need a refill on your cardiac medications before your next appointment, please call your pharmacy.     Follow-Up:  IN 4 MONTHS AS ANOTHER VIRTUAL TELEPHONE VISIT WITH DR. Meda Coffee ON WEDNESDAY July 20, 2019 AT 9 AM

## 2019-03-24 ENCOUNTER — Encounter: Payer: Self-pay | Admitting: Internal Medicine

## 2019-03-24 ENCOUNTER — Other Ambulatory Visit: Payer: Self-pay

## 2019-03-24 ENCOUNTER — Ambulatory Visit (INDEPENDENT_AMBULATORY_CARE_PROVIDER_SITE_OTHER): Payer: Medicare Other | Admitting: Internal Medicine

## 2019-03-24 DIAGNOSIS — T826XXD Infection and inflammatory reaction due to cardiac valve prosthesis, subsequent encounter: Secondary | ICD-10-CM | POA: Diagnosis not present

## 2019-03-24 DIAGNOSIS — I38 Endocarditis, valve unspecified: Secondary | ICD-10-CM

## 2019-03-24 MED ORDER — AMOXICILLIN 500 MG PO TABS: 500.0000 mg | ORAL_TABLET | Freq: Two times a day (BID) | ORAL | 11 refills | Status: DC

## 2019-03-24 NOTE — Progress Notes (Signed)
   Subjective:    Patient ID: Ryan [name], male    DOB: November 19, 1929, 85 y.o.   MRN: [id number]  HPI Follow up of previous prosthetic valve endocarditis He developed Enterococcal bacteremia in 2019 x 2 and treated for 6 weeks with dual beta lactam therapy.  I then put him on chronic suppression with amoxicillin 500 mg twice a day indefinitely.  He is having no issues, no rash or diarrhea.    Phone visit and spent 6 minutes  Refills sent for 1 year and he will follow up again in 1 year.     Review of Systems     Objective:   Physical Exam        Assessment & Plan:

## 2019-04-11 ENCOUNTER — Ambulatory Visit (INDEPENDENT_AMBULATORY_CARE_PROVIDER_SITE_OTHER): Payer: Medicare Other | Admitting: *Deleted

## 2019-04-11 DIAGNOSIS — I495 Sick sinus syndrome: Secondary | ICD-10-CM

## 2019-04-11 DIAGNOSIS — R55 Syncope and collapse: Secondary | ICD-10-CM | POA: Diagnosis not present

## 2019-04-12 LAB — CUP PACEART REMOTE DEVICE CHECK
Battery Impedance: 211 Ohm
Battery Remaining Longevity: 117 mo
Battery Voltage: 2.8 V
Brady Statistic AP VP Percent: 0 %
Brady Statistic AP VS Percent: 97 %
Brady Statistic AS VP Percent: 0 %
Brady Statistic AS VS Percent: 3 %
Date Time Interrogation Session: 20201109131441
Implantable Lead Implant Date: 20171020
Implantable Lead Implant Date: 20171020
Implantable Lead Location: 753859
Implantable Lead Location: 753860
Implantable Lead Model: 5076
Implantable Lead Model: 5076
Implantable Pulse Generator Implant Date: 20171020
Lead Channel Impedance Value: 453 Ohm
Lead Channel Impedance Value: 564 Ohm
Lead Channel Pacing Threshold Amplitude: 0.625 V
Lead Channel Pacing Threshold Amplitude: 0.625 V
Lead Channel Pacing Threshold Pulse Width: 0.4 ms
Lead Channel Pacing Threshold Pulse Width: 0.4 ms
Lead Channel Setting Pacing Amplitude: 2 V
Lead Channel Setting Pacing Amplitude: 2.5 V
Lead Channel Setting Pacing Pulse Width: 0.4 ms
Lead Channel Setting Sensing Sensitivity: 4 mV

## 2019-05-07 NOTE — Progress Notes (Signed)
Remote pacemaker transmission.   

## 2019-05-10 ENCOUNTER — Other Ambulatory Visit: Payer: Self-pay | Admitting: Cardiology

## 2019-05-10 MED ORDER — ISOSORBIDE MONONITRATE ER 60 MG PO TB24: 60.0000 mg | ORAL_TABLET | Freq: Every day | ORAL | 3 refills | Status: DC

## 2019-06-13 ENCOUNTER — Other Ambulatory Visit: Payer: Self-pay | Admitting: Cardiology

## 2019-06-13 NOTE — Telephone Encounter (Signed)
Prescription refill request for Eliquis received.  Last office visit: 10/14/2020Meda Coffee Scr: 2.23, 10/21/2018 Age: 86 y.o. Weight: 59 kg   Prescription refill sent.

## 2019-06-18 ENCOUNTER — Ambulatory Visit: Payer: Medicare Other | Attending: Internal Medicine

## 2019-06-18 DIAGNOSIS — Z23 Encounter for immunization: Secondary | ICD-10-CM | POA: Insufficient documentation

## 2019-06-18 NOTE — Progress Notes (Signed)
   Covid-19 Vaccination Clinic  Name:  Ryan [name]    MRN: [id number] DOB: [birth year]  06/18/2019  [name] was observed post Covid-19 immunization for 30 minutes based on pre-vaccination screening without incidence. He was provided with Vaccine Information Sheet and instruction to access the V-Safe system.   [name] was instructed to call 911 with any severe reactions post vaccine: Marland Kitchen Difficulty breathing  . Swelling of your face and throat  . A fast heartbeat  . A bad rash all over your body  . Dizziness and weakness    Immunizations Administered    Name Date Dose VIS Date Route   Pfizer COVID-19 Vaccine 06/18/2019 12:44 PM 0.3 mL 05/13/2019 Intramuscular   Manufacturer: [place]   Lot: [id number]   [place]: [id number]

## 2019-07-09 ENCOUNTER — Ambulatory Visit: Payer: Medicare Other | Attending: Internal Medicine

## 2019-07-09 DIAGNOSIS — Z23 Encounter for immunization: Secondary | ICD-10-CM | POA: Insufficient documentation

## 2019-07-09 NOTE — Progress Notes (Signed)
   Covid-19 Vaccination Clinic  Name:  Ryan [name]    MRN: [id number] DOB: 28-Mar-1930  07/09/2019  Ryan [name] was observed post Covid-19 immunization for 15 minutes without incidence. He was provided with Vaccine Information Sheet and instruction to access the V-Safe system.   Ryan [name] was instructed to call 911 with any severe reactions post vaccine: Marland Kitchen Difficulty breathing  . Swelling of your face and throat  . A fast heartbeat  . A bad rash all over your body  . Dizziness and weakness    Immunizations Administered    Name Date Dose VIS Date Route   Pfizer COVID-19 Vaccine 07/09/2019 12:50 PM 0.3 mL 05/13/2019 Intramuscular   Manufacturer: [place]   Lot: [id number]   [employer]: [id number]

## 2019-07-11 ENCOUNTER — Ambulatory Visit (INDEPENDENT_AMBULATORY_CARE_PROVIDER_SITE_OTHER): Payer: Medicare Other | Admitting: *Deleted

## 2019-07-11 DIAGNOSIS — R55 Syncope and collapse: Secondary | ICD-10-CM | POA: Diagnosis not present

## 2019-07-12 LAB — CUP PACEART REMOTE DEVICE CHECK
Battery Impedance: 210 Ohm
Battery Remaining Longevity: 118 mo
Battery Voltage: 2.79 V
Brady Statistic AP VP Percent: 0 %
Brady Statistic AP VS Percent: 97 %
Brady Statistic AS VP Percent: 0 %
Brady Statistic AS VS Percent: 3 %
Date Time Interrogation Session: 20210209095658
Implantable Lead Implant Date: 20171020
Implantable Lead Implant Date: 20171020
Implantable Lead Location: 753859
Implantable Lead Location: 753860
Implantable Lead Model: 5076
Implantable Lead Model: 5076
Implantable Pulse Generator Implant Date: 20171020
Lead Channel Impedance Value: 473 Ohm
Lead Channel Impedance Value: 590 Ohm
Lead Channel Pacing Threshold Amplitude: 0.625 V
Lead Channel Pacing Threshold Amplitude: 0.75 V
Lead Channel Pacing Threshold Pulse Width: 0.4 ms
Lead Channel Pacing Threshold Pulse Width: 0.4 ms
Lead Channel Setting Pacing Amplitude: 2 V
Lead Channel Setting Pacing Amplitude: 2.5 V
Lead Channel Setting Pacing Pulse Width: 0.4 ms
Lead Channel Setting Sensing Sensitivity: 4 mV

## 2019-07-12 NOTE — Progress Notes (Signed)
PPM Remote  

## 2019-07-13 DIAGNOSIS — H401223 Low-tension glaucoma, left eye, severe stage: Secondary | ICD-10-CM | POA: Diagnosis not present

## 2019-07-13 DIAGNOSIS — H401213 Low-tension glaucoma, right eye, severe stage: Secondary | ICD-10-CM | POA: Diagnosis not present

## 2019-07-20 ENCOUNTER — Encounter: Payer: Self-pay | Admitting: Cardiology

## 2019-07-20 ENCOUNTER — Telehealth (INDEPENDENT_AMBULATORY_CARE_PROVIDER_SITE_OTHER): Payer: Medicare Other | Admitting: Cardiology

## 2019-07-20 ENCOUNTER — Other Ambulatory Visit: Payer: Self-pay

## 2019-07-20 VITALS — BP 148/77 | HR 61 | Wt 132.0 lb

## 2019-07-20 DIAGNOSIS — Z950 Presence of cardiac pacemaker: Secondary | ICD-10-CM | POA: Diagnosis not present

## 2019-07-20 DIAGNOSIS — I2510 Atherosclerotic heart disease of native coronary artery without angina pectoris: Secondary | ICD-10-CM

## 2019-07-20 DIAGNOSIS — Z952 Presence of prosthetic heart valve: Secondary | ICD-10-CM

## 2019-07-20 DIAGNOSIS — I480 Paroxysmal atrial fibrillation: Secondary | ICD-10-CM | POA: Diagnosis not present

## 2019-07-20 DIAGNOSIS — I5043 Acute on chronic combined systolic (congestive) and diastolic (congestive) heart failure: Secondary | ICD-10-CM

## 2019-07-20 DIAGNOSIS — I4891 Unspecified atrial fibrillation: Secondary | ICD-10-CM

## 2019-07-20 MED ORDER — NITROGLYCERIN 0.4 MG SL SUBL: 0.4000 mg | SUBLINGUAL_TABLET | SUBLINGUAL | 3 refills | Status: DC | PRN

## 2019-07-20 NOTE — Patient Instructions (Signed)
Medication Instructions:   Your physician recommends that you continue on your current medications as directed. Please refer to the Current Medication list given to you today.  *If you need a refill on your cardiac medications before your next appointment, please call your pharmacy*   Lab Work:  ON NEXT Tuesday 07/26/19 HERE AT THE OFFICE--WE WILL CHECK CMET, CBC, TSH, PRO-BNP, AND LIPIDS--PLEASE COME FASTING TO THIS LAB APPOINTMENT  If you have labs (blood work) drawn today and your tests are completely normal, you will receive your results only by: Marland Kitchen MyChart Message (if you have MyChart) OR . A paper copy in the mail If you have any lab test that is abnormal or we need to change your treatment, we will call you to review the results.    Follow-Up: At [facility], you and your health needs are our priority.  As part of our continuing mission to provide you with exceptional heart care, we have created designated Provider Care Teams.  These Care Teams include your primary Cardiologist (physician) and Advanced Practice Providers (APPs -  Physician Assistants and Nurse Practitioners) who all work together to provide you with the care you need, when you need it.   IN 4 MONTHS WITH DR. Meda Coffee IN THE OFFICE ON 11/17/19 AT 9:00 AM

## 2019-07-20 NOTE — Progress Notes (Signed)
Virtual Visit via Video Note   This visit type was conducted due to national recommendations for restrictions regarding the COVID-19 Pandemic (e.g. social distancing) in an effort to limit this patient's exposure and mitigate transmission in our community.  Due to his co-morbid illnesses, this patient is at least at moderate risk for complications without adequate follow up.  This format is felt to be most appropriate for this patient at this time.  All issues noted in this document were discussed and addressed.  A limited physical exam was performed with this format.  Please refer to the patient's chart for his consent to telehealth for [facility].   Evaluation Performed:  Follow-up visit, 3 months  Date:  07/20/2019   ID:  Ryan [name], DOB Jun 18, 1929, MRN [id number]  Patient Location: Home Provider Location: Home  PCP:  Dorothyann Peng, NP  Cardiologist:  Ena Dawley, MD  Electrophysiologist:  Dr Lovena Le  Reason for visit: 4 months follow up  History of Present Illness:    Ryan [name] is a 86 y.o. 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 He was doing better until 3 daysprior to [place] he developed worsening left low back pain and profound fatigue.  He was admitted on November 07, 2017 and diagnosed with eterococcal bacteremia withprosthetic valve endocarditis.  There was a large vegetation seen on bioprosthetic aortic valve, no vegetation was seen on pacemaker  leads.Patient has been on IV antibiotics IV ampicillin and Rocephin for the endocarditis. Patient was discharged to skilled nursing facility to complete course of antibiotics, completed on December 22, 2017 During the hospital stay, the patient developed an acute kidney injury believed to be secondary to dehydration and possible endocarditis embolization. Patient's intractable back pain was further evaluated, CT showed spondylosis, mild to moderate multilevel foraminal stenosis, pars defect L 4-5.   Neurosurgery reassessed on 6/11 and indicatedthat his acute onset of back pain is due to 1 of 3 possibilities: Exacerbation of chronic lumbar spondylosis, spontaneous spinal EDH related to Eliquis or discitis. He recommends nonoperative treatment, continue antibiotics for his bacteremia and if stroke risk felt to be high without Eliquis then consider resuming it.   01/27/2018 -the patient was readmitted on January 15, 2018 for complaint of like of energy and chest pain with elevated troponin.  Coronary CTA on August 16 showed widely patent mid RCA stent the remainder of anatomy and change, the etiology of his chest pain is not supported by any change in his coronary anatomy and medical therapy was recommended.  He was treated for acute combined systolic and diastolic heart failure.  Discharged on August 20 has 2019. Today he states that he has more energy, he finally feels little better, alternates exercises with naps during the day, denies any chest pain other than heartburn at night, no lower extremity edema, no orthopnea, paroxmal nocturnal dyspnea no fevers.  04/20/2018 -patient was readmitted on October 28 with recurrent fevers, he has been started on IV ampicillin and Rocephin, his leukocytosis was improved and he appears nontoxic, TEE did not show any recurrent  aortic valve endocarditis.  He is blood cultures are negative.  He received PICC line to his right arm on April 01, 2018 and will receive IV  penicillin with Rocephin for 6 weeks.  He is supposed to follow with ID service as well.  May 14, 2018 -the patient is coming after 4 weeks, he has been doing well he completed his 6-week course of  IV antibiotics and is on lifelong therapy with amoxicillin.  He is also better than he has felt in a long time he denies any fever or chills, no palpitations chest pain, no lower extremity edema orthopnea or paroxysmal nocturnal dyspnea.  No bleeding.  06/17/2018 -this is 4 weeks follow-up the patient feels and looks great, he states he feels tired however he still able to perform 1 hour and stationary bike, use rowing machine and walk for about 20 minutes a day.  He has a lot of interruptions and he sleeps but takes naps during the day so overall he gets 8 hours of sleep.  He denies any fever or chills.  He now uses Lasix only occasionally as needed as his lower extremity edema has resolved.  He use compression socks.  He denies any chest pain and overall just feels really well right now.  12/13/2018 -this is 1 months follow-up, the patient reports he has been doing great, his fatigue is slightly improved, he used to take 3 naps a day now only 1 nap a day he continues to walk in his pool without any chest pain or shortness of breath.  He stopped using Lasix every other day as his lower extremity edema has resolved and now uses it only PRN.  No palpitations and no fevers.  03/16/2019 -the patient with the same symptoms of fatigue and taking naps during the day but then inability to sleep at night.  However considering his age and diagnosis is doing incredibly well.  He is able to bike and walk daily and do push-ups against the wall.  He has very minimal lower extremity edema for which he drinks on Lasix however very occasionally.  He has occasional dizziness but no falls.  He states that he feels like the more short of breath.  07/20/2019 - the patient has been doing great, he uses stationary bike daily  for an hour, then he walks with his wife outside. He experiences mild DOE while walking but not while biking. He denies any chest pain, no palpitations, no bleeding, no falls.   The patient does not have symptoms concerning for COVID-19 infection (fever, chills, cough, or new shortness of breath).   Past Medical History:  Diagnosis Date  . Age-related macular degeneration   . Allergic rhinitis   . 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 Anemia 12/2017   normocytic.  iron deficient, ferritin 17 04/2018  . Arthritis    "hands, lower back" (04/30/2016)  . Asthma   . Bacterial endocarditis   . Carotid artery obstruction    a. s/p bilat CEA.  . Chronic combined systolic (congestive) and diastolic (congestive) heart failure ([place]) 03/18/2016  . Chronic lower back pain   . CKD (chronic kidney disease) stage 4, GFR 15-29 ml/min (HCC) 12/2017  . Claudication in peripheral vascular disease ([place]) 01/01/2018   Claudication  . Coronary arteriosclerosis in native artery    a. 2017 Cardiac catheterization demonstrated worsening CAD with 70% mid LCx, 80% OM1 and 80% mid RCA stenosis. b.  In 11/17, he underwent successful rotational atherectomy and DES to  RCA.  . Decreased diffusion capacity 10/23/2017  . Diverticulitis of colon   . DJD (degenerative joint disease)   . Elevated TSH 06/30/2017  . GERD (gastroesophageal reflux disease)   . History of hiatal hernia   . HLD (hyperlipidemia)   . Hypertension   . Hypertensive heart disease with heart failure ([place])   . Intractable back pain 11/07/2017  . OSA (obstructive sleep apnea)    intolerant to CPAP  . PAF (paroxysmal atrial fibrillation) ([place])   . Paroxysmal supraventricular tachycardia ([place])   . Presence of permanent cardiac pacemaker   . Primary malignant neoplasm of bladder ([place])   . Prostate cancer ([place])   . Prosthetic valve endocarditis ([place]) 11/10/2017   enterococcal bacteremia  . S/P AVR 12/02/2017  . Syncope 10/23/2017  . Tachy-brady  syndrome ([place])   . Thrombocytopenia ([place]) 11/07/2017  . Urinary retention 11/07/2017   Past Surgical History:  Procedure Laterality Date  . APPENDECTOMY    . CARDIAC CATHETERIZATION N/A 01/17/2016   Procedure: Right/Left Heart Cath and Coronary Angiography;  Surgeon: Belva Crome, MD;  Location: [place] [facility];  Service: Cardiovascular;  Laterality: N/A;  . CARDIAC CATHETERIZATION N/A 04/30/2016   Procedure: Coronary/Graft Atherectomy;  Surgeon: Sherren Mocha, MD;  Location: [place] [facility];  Service: Cardiovascular;  Laterality: N/A;  . CAROTID ENDARTERECTOMY Bilateral   . CATARACT EXTRACTION W/ INTRAOCULAR LENS  IMPLANT, BILATERAL Bilateral   . COLON SURGERY  1981   Meckles Diverticulum with volvulus  . COLONOSCOPY WITH PROPOFOL N/A 05/01/2018   Procedure: COLONOSCOPY WITH PROPOFOL;  Surgeon: Rush Landmark Telford Nab., MD;  Location: [place];  Service: Gastroenterology;  Laterality: N/A;  . CORONARY ANGIOGRAPHY N/A 01/18/2018   Procedure: CORONARY ANGIOGRAPHY;  Surgeon: Lorretta Harp, MD;  Location: [place] [facility];  Service: Cardiovascular;  Laterality: N/A;  . ENTEROSCOPY N/A 04/30/2018   Procedure: ENTEROSCOPY;  Surgeon: Rush Landmark Telford Nab., MD;  Location: [place];  Service: Gastroenterology;  Laterality: N/A;  . EP IMPLANTABLE DEVICE N/A 03/21/2016   Procedure: Pacemaker Implant;  Surgeon: Evans Lance, MD;  Location: [place] [facility];  Service: Cardiovascular;  Laterality: N/A;  . EYE SURGERY    . INGUINAL HERNIA REPAIR Right 2009  . INSERT / REPLACE / REMOVE PACEMAKER    . INSERTION PROSTATE RADIATION SEED    . POLYPECTOMY  05/01/2018   Procedure: POLYPECTOMY;  Surgeon: Mansouraty, Telford Nab., MD;  Location: [place];  Service: Gastroenterology;;  . TEE WITHOUT CARDIOVERSION N/A 06/10/2016   Procedure: TRANSESOPHAGEAL ECHOCARDIOGRAM (TEE);  Surgeon: Sherren Mocha, MD;  Location: [place];  Service: Open Heart Surgery;  Laterality: N/A;  . TEE WITHOUT  CARDIOVERSION N/A 11/10/2017   Procedure: TRANSESOPHAGEAL ECHOCARDIOGRAM (TEE);  Surgeon: Acie Fredrickson Wonda Cheng, MD;  Location: [facility] ENDOSCOPY;  Service: Cardiovascular;  Laterality: N/A;  . TEE WITHOUT CARDIOVERSION N/A 12/15/2017   Procedure: TRANSESOPHAGEAL ECHOCARDIOGRAM (TEE);  Surgeon: Buford Dresser, MD;  Location: [facility] ENDOSCOPY;  Service: Cardiovascular;  Laterality: N/A;  . TEE WITHOUT CARDIOVERSION N/A 03/30/2018   Procedure: TRANSESOPHAGEAL ECHOCARDIOGRAM (TEE);  Surgeon: Jerline Pain, MD;  Location[facility] ENDOSCOPY;  Service: Cardiovascular;  Laterality: N/A;  . TONSILLECTOMY  ~ 1947  . TRANSCATHETER AORTIC VALVE REPLACEMENT, TRANSFEMORAL N/A 06/10/2016   Procedure: TRANSCATHETER AORTIC VALVE REPLACEMENT, TRANSFEMORAL;  Surgeon: Sherren Mocha, MD;  Location: [place];  Service: Open Heart Surgery;  Laterality: N/A;    Current Meds  Medication Sig  . albuterol (VENTOLIN HFA) 108 (90 Base) MCG/ACT inhaler Inhale 2 puffs  into the lungs every 6 (six) hours as needed for wheezing or shortness of breath.  Marland Kitchen amLODipine (NORVASC) 5 MG tablet TAKE 1 TABLET(5 MG) BY MOUTH DAILY  . amoxicillin (AMOXIL) 500 MG tablet Take 1 tablet (500 mg total) by mouth 2 (two) times daily.  Marland Kitchen atorvastatin (LIPITOR) 40 MG tablet Take 1 tablet (40 mg total) by mouth daily at 6 PM.  . cholecalciferol (VITAMIN D) 1000 units tablet Take 1,000 Units by mouth 2 (two) times a week.   . Coenzyme Q10 (CO Q 10) 100 MG CAPS Take 200 mg by mouth daily.  Marland Kitchen ELIQUIS 2.5 MG TABS tablet TAKE 1 TABLET(2.5 MG) BY MOUTH TWICE DAILY  . furosemide (LASIX) 20 MG tablet Take 1 tablet (20 mg total) by mouth daily as needed for fluid or edema.  . isosorbide mononitrate (IMDUR) 60 MG 24 hr tablet Take 1 tablet (60 mg total) by mouth daily.  Marland Kitchen latanoprost (XALATAN) 0.005 % ophthalmic solution Place 1 drop into both eyes at bedtime.  Marland Kitchen levothyroxine (SYNTHROID) 25 MCG tablet TAKE 1/2 TABLET BY MOUTH EVERY DAY BEFORE BREAKFAST  . metoprolol  succinate (TOPROL-XL) 100 MG 24 hr tablet Take 1 tablet (100 mg total) by mouth daily. Take with or immediately following a meal.  . Netarsudil Dimesylate (RHOPRESSA) 0.02 % SOLN Place 1 drop into both eyes at bedtime.  . nitroGLYCERIN (NITROSTAT) 0.4 MG SL tablet Place 1 tablet (0.4 mg total) under the tongue every 5 (five) minutes x 3 doses as needed for chest pain.  . pantoprazole (PROTONIX) 40 MG tablet Take 40 mg by mouth as needed.  . polyethylene glycol (MIRALAX / GLYCOLAX) 17 g packet Take 17 g by mouth daily as needed.  Marland Kitchen UNABLE TO FIND Take 1 tablet by mouth daily. Megafood blood builder vit c, folate, b12, iron and beet root.  . vitamin B-12 (CYANOCOBALAMIN) 1000 MCG tablet Take 1,000 mcg by mouth daily.     Allergies:   Ciprofloxacin, Hydrochlorothiazide, Sulfa antibiotics, Ace inhibitors, Losartan, Carvedilol, Lisinopril, and Soy allergy   Social History   Tobacco Use  . Smoking status: Never Smoker  . Smokeless tobacco: Never Used  Substance Use Topics  . Alcohol use: No    Alcohol/week: 0.0 standard drinks    Comment: 04/30/2016 "nothing in years"  . Drug use: No    Family Hx: The patient's family history includes Heart disease in his father and mother.  ROS:   Please see the history of present illness.    All other systems reviewed and are negative.  Prior CV studies:   The following studies were reviewed today:  Labs/Other Tests and Data Reviewed:    EKG:  No ECG reviewed.  Recent Labs: 10/21/2018: ALT 13; BUN 52; Creatinine, Ser 2.23; Hemoglobin 13.3; NT-Pro BNP 516; Platelets 186; Potassium 4.5; Sodium 143; TSH 3.150   Recent Lipid Panel Lab Results  Component Value Date/Time   CHOL 130 05/01/2017 08:27 AM   TRIG 70.0 05/01/2017 08:27 AM   HDL 56.00 05/01/2017 08:27 AM   CHOLHDL 2 05/01/2017 08:27 AM   LDLCALC 60 05/01/2017 08:27 AM   Wt Readings from Last 3 Encounters:  07/20/19 132 lb (59.9 kg)  03/16/19 132 lb (59.9 kg)  12/14/18 133 lb (60.3  kg)    Objective:    Vital Signs:  BP (!) 148/77   Pulse 61   Wt 132 lb (59.9 kg)   BMI 21.31 kg/m    VITAL SIGNS:  reviewed    ASSESSMENT &  PLAN:    1.  CAD -last cath in August 2019 showed Prox Cx lesion is 70% stenosed. Ost 1st Mrg lesion is 75% stenosed. Previously placed Mid RCA stent (unknown type) is widely patent. Prox RCA lesion is 40% stenosed. Post Atrio lesion is 75% stenosed. Ost LAD to Prox LAD lesion is 30% stenosed. - the patient remains very active with 1.5 hour of physical activity daily with minimal symptoms, he is congratulated on that.  - we will continue Imdur, Metoprolol, Atorvastatin  2. Bioprosthetic aortic valve endocarditis -diagnosed on November 13, 2017, completed 6 weeks course of IV antibiotics and on lifelong Ampicillin.  He takes it with probiotics and has no significant diarrhea.  He has no recurrent fever, no evidence of endocarditis on TEE on March 30, 2018. Afebrile.  3.  Paroxysmal atrial fibrillation, he is maintaining sinus rhythm, on Eliquis, has no bleeding, on most recent pacemaker interrogation his A. fib burden is less than 0.1%. We will check Hb.  4.    Acute on chronic combined CHF with cardiomyopathy - LV function has declined over the last several years.    He is now functional class NYHA IIb.  We will repeat labs.   5. Sick sinus syndrome, status post pacemaker placement, working normally, followed by Dr. Lovena Le.  COVID-19 Education: The signs and symptoms of COVID-19 were discussed with the patient and how to seek care for testing (follow up with PCP or arrange E-visit).  The importance of social distancing was discussed today.  Time:   Today, I have spent 25 minutes with the patient with telehealth technology discussing the above problems.     Medication Adjustments/Labs and Tests Ordered: Current medicines are reviewed at length with the patient today.  Concerns regarding medicines are outlined above.   Tests Ordered: No  orders of the defined types were placed in this encounter.   Medication Changes: No orders of the defined types were placed in this encounter.   Disposition:  Follow up in 4 months  Signed, Ena Dawley, MD  07/20/2019 9:00 AM    [place]

## 2019-07-26 ENCOUNTER — Other Ambulatory Visit: Payer: Self-pay

## 2019-07-26 ENCOUNTER — Other Ambulatory Visit: Payer: Medicare Other

## 2019-07-26 DIAGNOSIS — I5043 Acute on chronic combined systolic (congestive) and diastolic (congestive) heart failure: Secondary | ICD-10-CM

## 2019-07-26 DIAGNOSIS — I4891 Unspecified atrial fibrillation: Secondary | ICD-10-CM

## 2019-07-26 DIAGNOSIS — I2510 Atherosclerotic heart disease of native coronary artery without angina pectoris: Secondary | ICD-10-CM

## 2019-07-26 DIAGNOSIS — I480 Paroxysmal atrial fibrillation: Secondary | ICD-10-CM

## 2019-07-26 DIAGNOSIS — Z952 Presence of prosthetic heart valve: Secondary | ICD-10-CM | POA: Diagnosis not present

## 2019-07-26 DIAGNOSIS — Z950 Presence of cardiac pacemaker: Secondary | ICD-10-CM | POA: Diagnosis not present

## 2019-07-27 LAB — COMPREHENSIVE METABOLIC PANEL
ALT: 12 IU/L (ref 0–44)
AST: 28 IU/L (ref 0–40)
Albumin/Globulin Ratio: 1.8 (ref 1.2–2.2)
Albumin: 4.6 g/dL (ref 3.6–4.6)
Alkaline Phosphatase: 88 IU/L (ref 39–117)
BUN/Creatinine Ratio: 19 (ref 10–24)
BUN: 37 mg/dL — ABNORMAL HIGH (ref 8–27)
Bilirubin Total: 0.7 mg/dL (ref 0.0–1.2)
CO2: 20 mmol/L (ref 20–29)
Calcium: 9.6 mg/dL (ref 8.6–10.2)
Chloride: 104 mmol/L (ref 96–106)
Creatinine, Ser: 2 mg/dL — ABNORMAL HIGH (ref 0.76–1.27)
GFR calc Af Amer: 34 mL/min/{1.73_m2} — ABNORMAL LOW (ref >59)
GFR calc non Af Amer: 29 mL/min/{1.73_m2} — ABNORMAL LOW (ref >59)
Globulin, Total: 2.5 g/dL (ref 1.5–4.5)
Glucose: 93 mg/dL (ref 65–99)
Potassium: 4.3 mmol/L (ref 3.5–5.2)
Sodium: 143 mmol/L (ref 134–144)
Total Protein: 7.1 g/dL (ref 6.0–8.5)

## 2019-07-27 LAB — CBC
Hematocrit: 46.9 % (ref 37.5–51.0)
Hemoglobin: 15.3 g/dL (ref 13.0–17.7)
MCH: 30.6 pg (ref 26.6–33.0)
MCHC: 32.6 g/dL (ref 31.5–35.7)
MCV: 94 fL (ref 79–97)
Platelets: 178 10*3/uL (ref 150–450)
RBC: 5 x10E6/uL (ref 4.14–5.80)
RDW: 12.5 % (ref 11.6–15.4)
WBC: 6.3 10*3/uL (ref 3.4–10.8)

## 2019-07-27 LAB — PRO B NATRIURETIC PEPTIDE: NT-Pro BNP: 419 pg/mL (ref 0–486)

## 2019-07-27 LAB — LIPID PANEL
Chol/HDL Ratio: 2 ratio (ref 0.0–5.0)
Cholesterol, Total: 126 mg/dL (ref 100–199)
HDL: 64 mg/dL (ref >39)
LDL Chol Calc (NIH): 42 mg/dL (ref 0–99)
Triglycerides: 110 mg/dL (ref 0–149)
VLDL Cholesterol Cal: 20 mg/dL (ref 5–40)

## 2019-07-27 LAB — TSH: TSH: 2.71 u[IU]/mL (ref 0.450–4.500)

## 2019-08-29 ENCOUNTER — Other Ambulatory Visit: Payer: Self-pay | Admitting: Cardiology

## 2019-08-30 NOTE — Telephone Encounter (Signed)
Dispense 1 inhaler with 2 refills. Thanks!

## 2019-08-30 NOTE — Telephone Encounter (Signed)
Pt's pharmacy is requesting a refill on albuterol. Would Dr. Nelson like to refill this medication? Please address °

## 2019-08-30 NOTE — Telephone Encounter (Signed)
Yes, please refill.  Thank you

## 2019-08-30 NOTE — Telephone Encounter (Signed)
Ivy, LPN, what are you dispensing and how many refills? Please address

## 2019-09-11 ENCOUNTER — Other Ambulatory Visit: Payer: Self-pay | Admitting: Cardiology

## 2019-10-10 ENCOUNTER — Ambulatory Visit (INDEPENDENT_AMBULATORY_CARE_PROVIDER_SITE_OTHER): Payer: Medicare Other | Admitting: *Deleted

## 2019-10-10 DIAGNOSIS — I495 Sick sinus syndrome: Secondary | ICD-10-CM

## 2019-10-10 LAB — CUP PACEART REMOTE DEVICE CHECK
Battery Impedance: 235 Ohm
Battery Remaining Longevity: 115 mo
Battery Voltage: 2.79 V
Brady Statistic AP VP Percent: 0 %
Brady Statistic AP VS Percent: 97 %
Brady Statistic AS VP Percent: 0 %
Brady Statistic AS VS Percent: 3 %
Date Time Interrogation Session: 20210510090143
Implantable Lead Implant Date: 20171020
Implantable Lead Implant Date: 20171020
Implantable Lead Location: 753859
Implantable Lead Location: 753860
Implantable Lead Model: 5076
Implantable Lead Model: 5076
Implantable Pulse Generator Implant Date: 20171020
Lead Channel Impedance Value: 493 Ohm
Lead Channel Impedance Value: 601 Ohm
Lead Channel Pacing Threshold Amplitude: 0.5 V
Lead Channel Pacing Threshold Amplitude: 0.75 V
Lead Channel Pacing Threshold Pulse Width: 0.4 ms
Lead Channel Pacing Threshold Pulse Width: 0.4 ms
Lead Channel Setting Pacing Amplitude: 2 V
Lead Channel Setting Pacing Amplitude: 2.5 V
Lead Channel Setting Pacing Pulse Width: 0.4 ms
Lead Channel Setting Sensing Sensitivity: 4 mV

## 2019-10-10 NOTE — Progress Notes (Signed)
Remote pacemaker transmission.   

## 2019-10-11 ENCOUNTER — Other Ambulatory Visit: Payer: Self-pay | Admitting: Cardiology

## 2019-11-12 IMAGING — DX DG LUMBAR SPINE COMPLETE 4+V
5 series · 5 of 5 positions shown · non-contrast
Comparison: Abdominal CT scan March 19, 2016

CLINICAL DATA: Worsening low back pain for the past 2 weeks with no
known injury.

EXAM:
LUMBAR SPINE - COMPLETE 4+ VIEW

[l-spine ap]
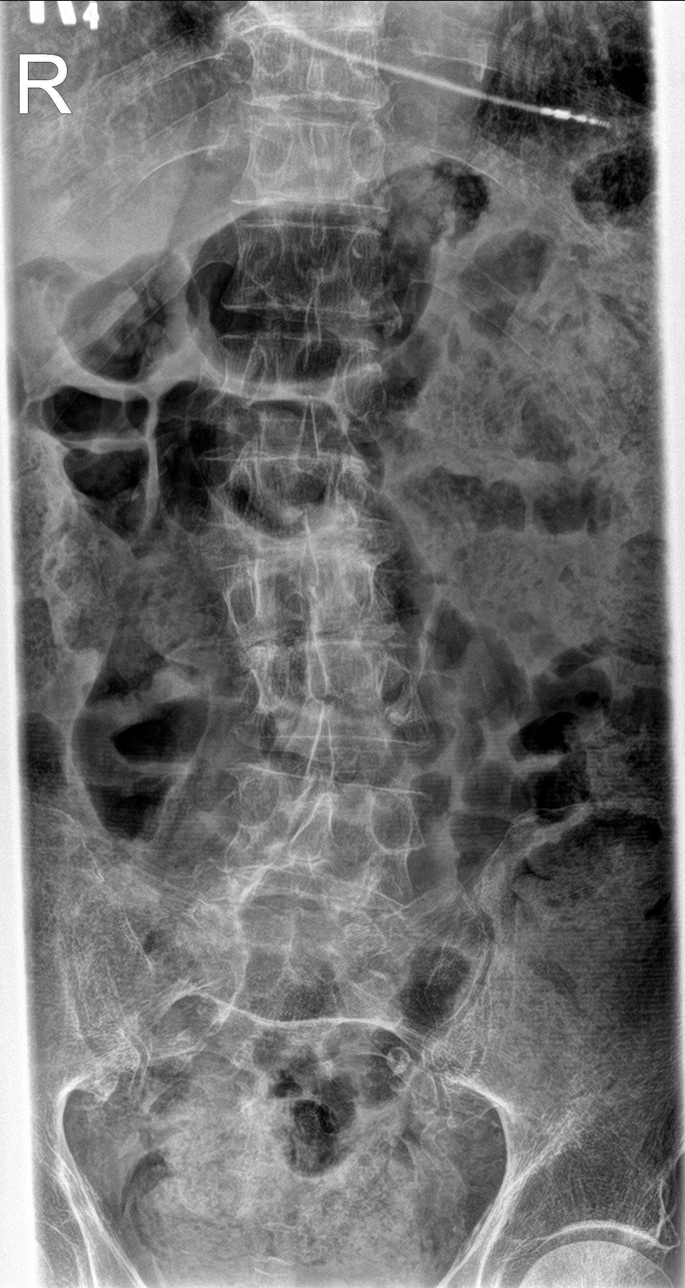

[l-spine obl (1 of 2)]
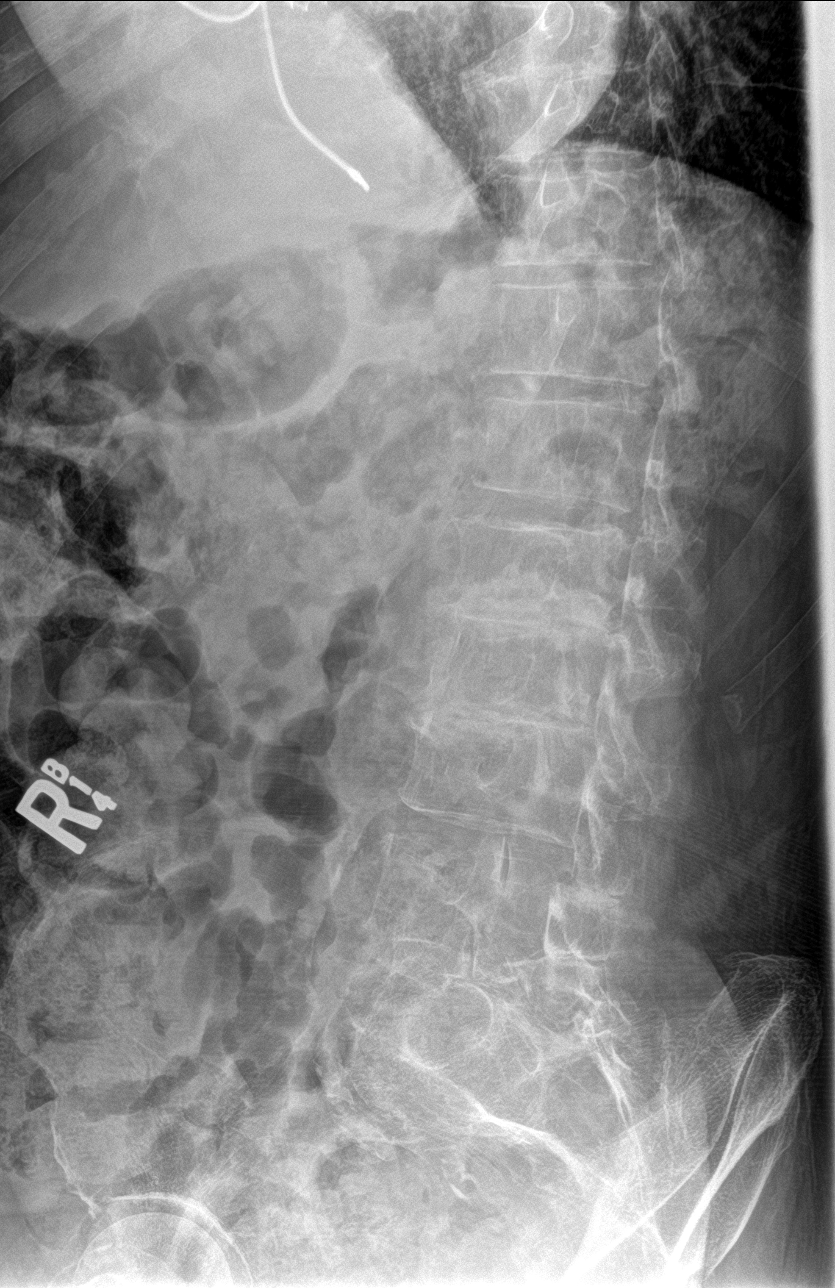

[l-spine obl (2 of 2)]
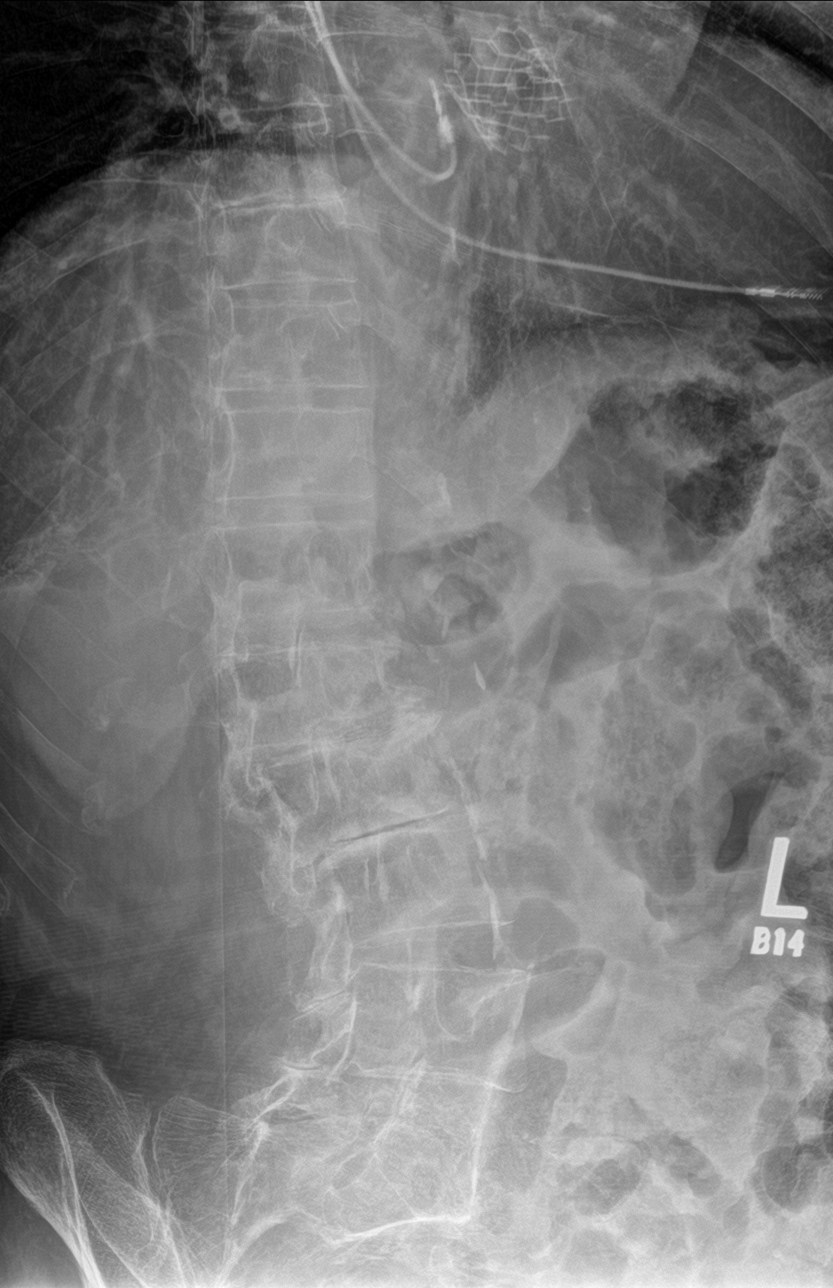

[l-spine lat]
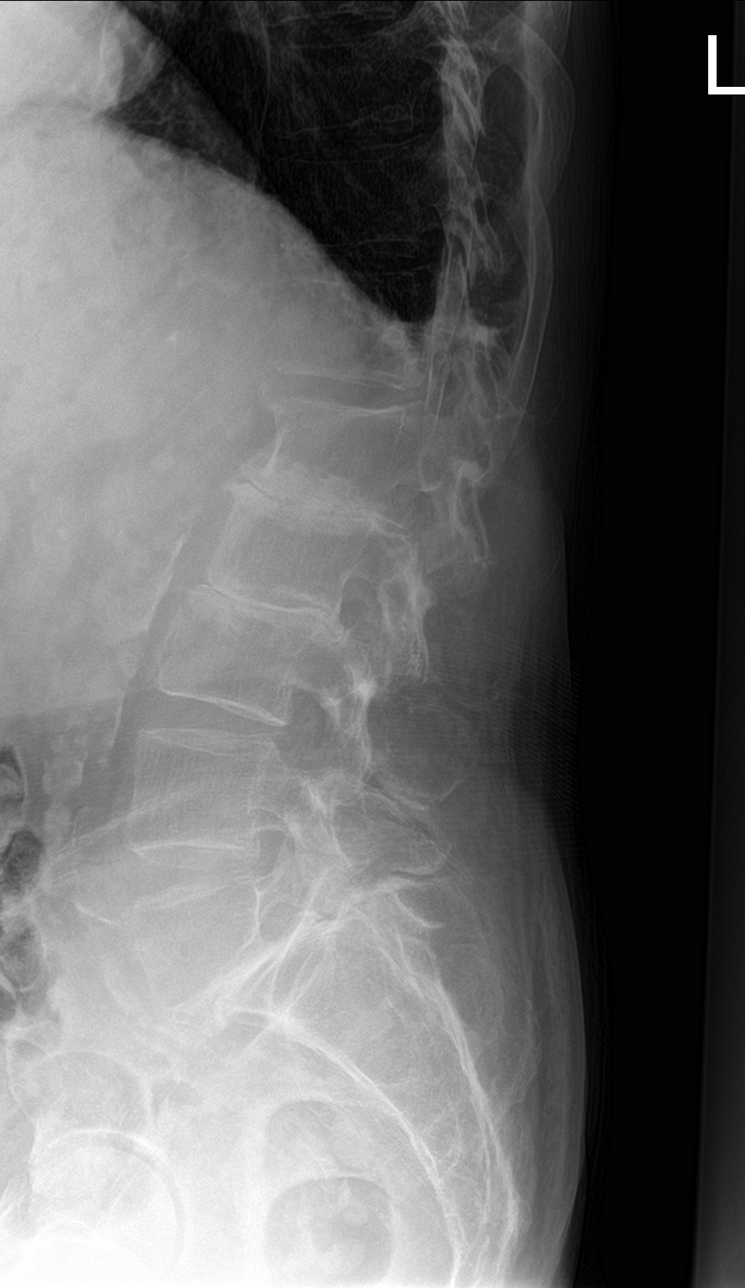

[l-spine spot]
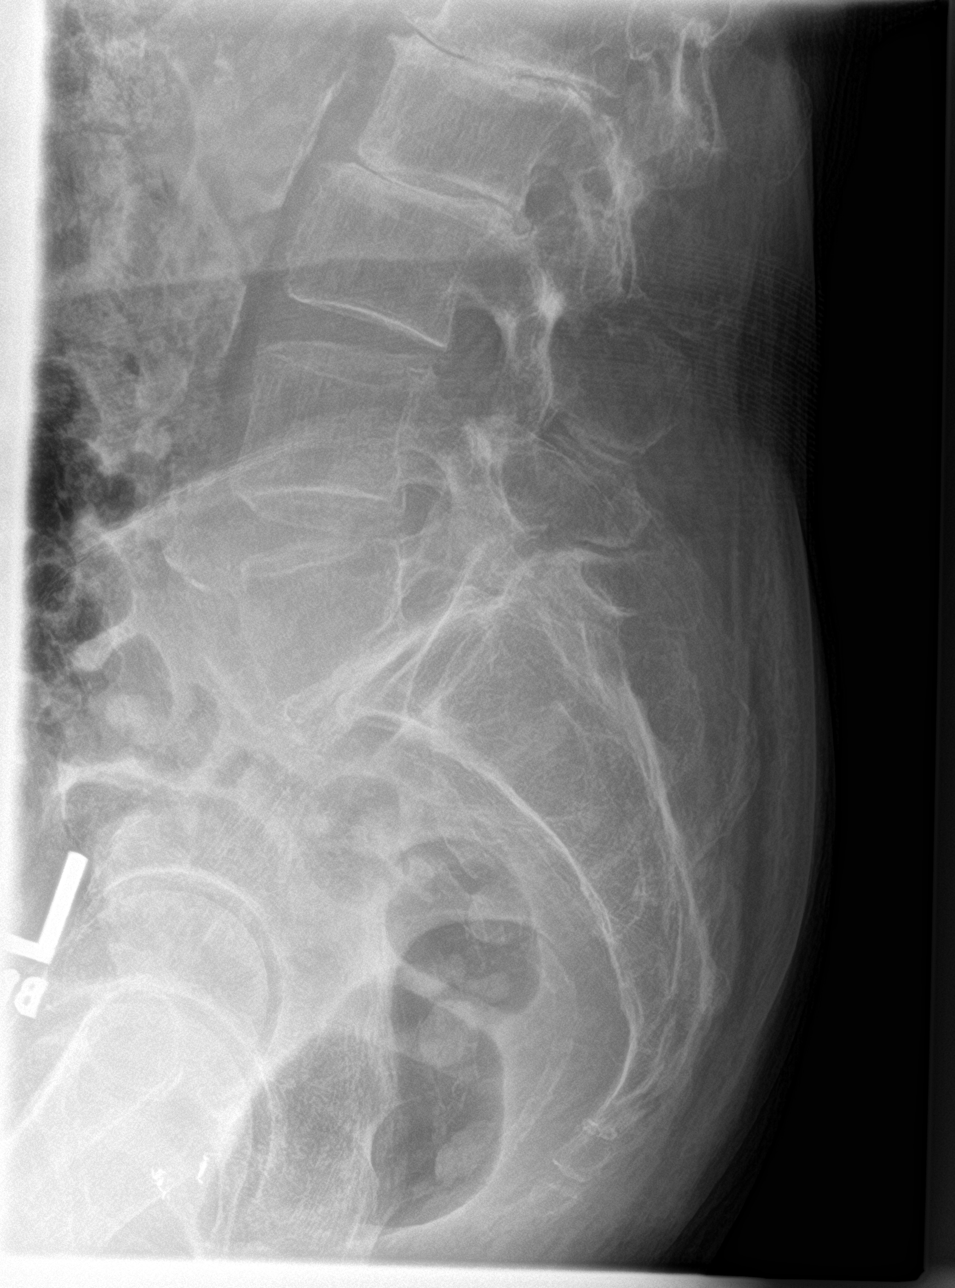

[5 of 5 positions shown; findings below may reference images not displayed]

FINDINGS: The colonic stool burden is increased diffusely. There is mild
levocurvature centered at L4-5. There is grade 2 anterolisthesis of
L5 with respect to S1 measuring approximately 1.7 cm. There is
degenerative disc space narrowing at L1-2 and L2-3 as well as at
L5-S1. There are bilateral pars defects at L5. The pedicles appear
intact. The observed portions of the sacrum are normal.
IMPRESSION: Grade 2 anterolisthesis of L5 with respect S1 secondary to
degenerative disc disease and bilateral pars defects. Moderate to
severe disc space narrowing at L1-2 and at L2-3 consistent with
degenerative change.

## 2019-11-12 IMAGING — DX DG CHEST 2V
2 series · 2 of 2 positions shown · non-contrast
Comparison: PA and lateral chest x-ray September 06, 2017

CLINICAL DATA: Cough and wheezing due to bronchitis. History of
valve replacement and pacemaker placement. Nonsmoker.

EXAM:
CHEST - 2 VIEW

[chest pa]
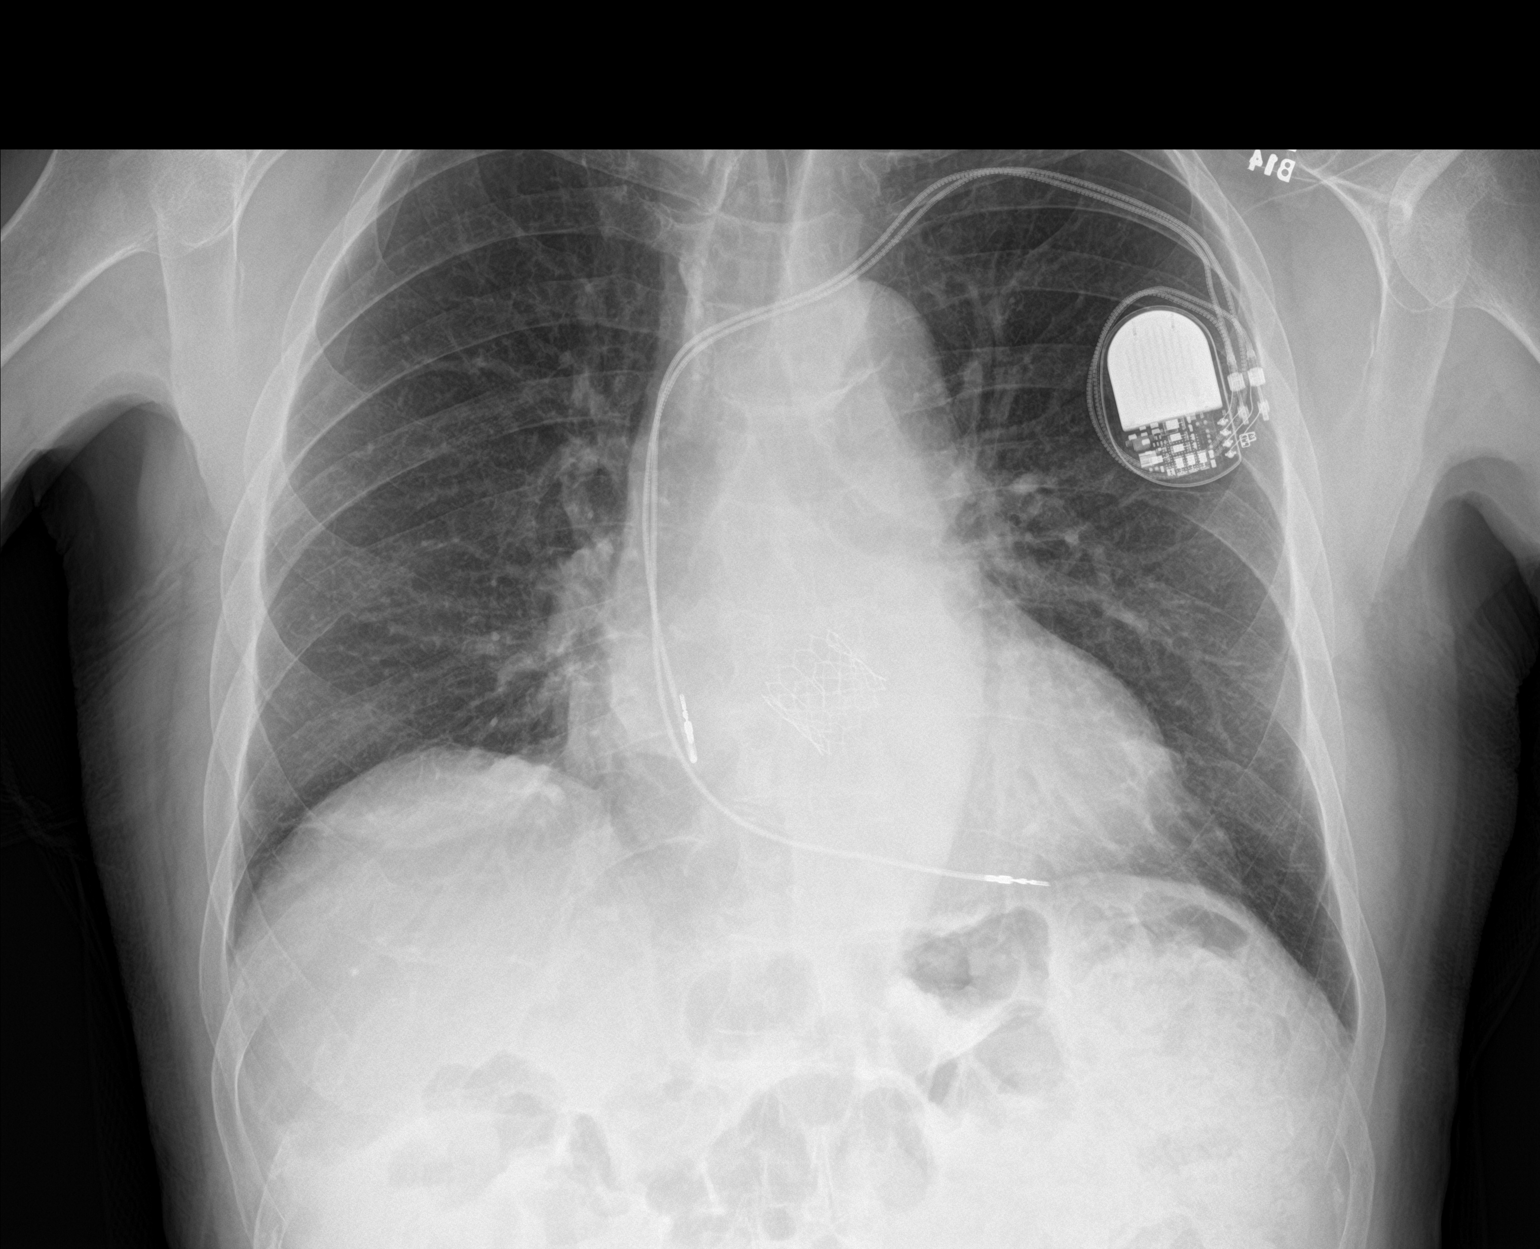

[chest lat]
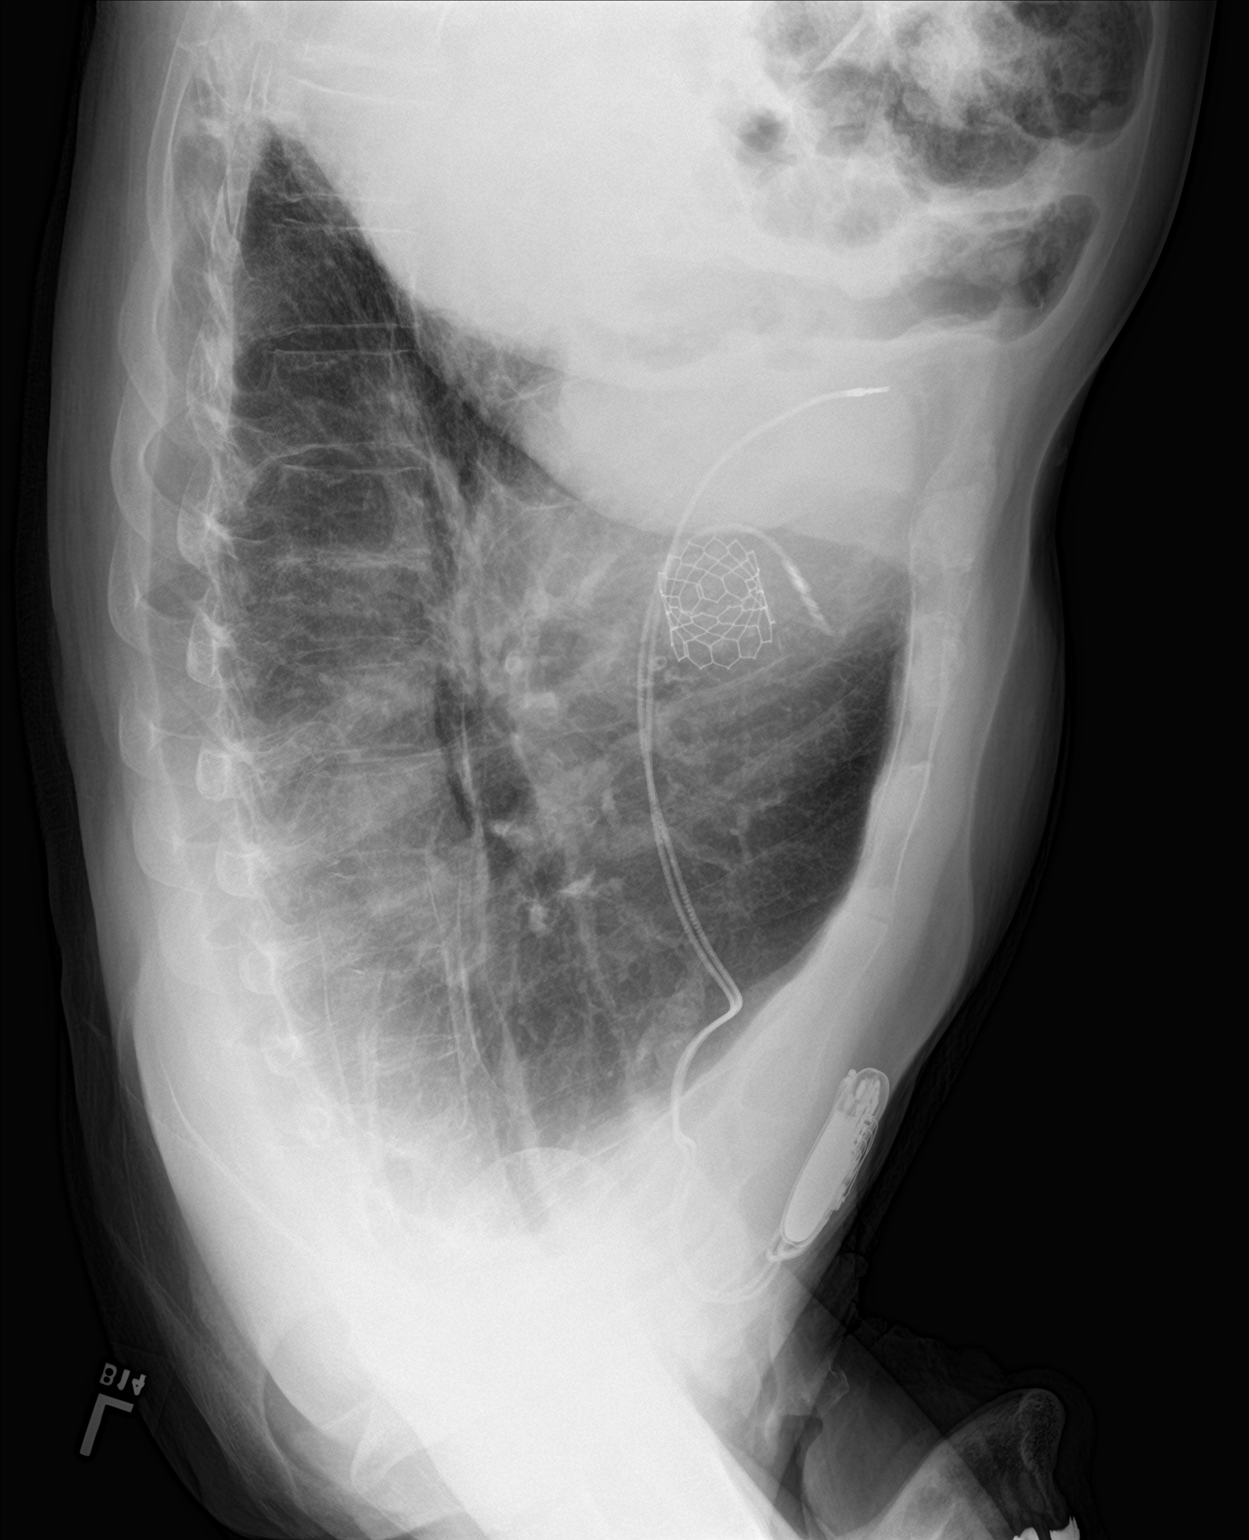

[2 of 2 positions shown; findings below may reference images not displayed]

FINDINGS: The lungs are well-expanded. There is no focal infiltrate. There is
no pleural effusion. The heart and pulmonary vascularity are normal.
And prosthetic aortic valve cage is present. The ICD is in stable
position. There is calcification in the wall of the aortic arch. The
bony thorax exhibits no acute abnormality.
IMPRESSION: There is no active cardiopulmonary disease.

## 2019-11-14 DIAGNOSIS — H26493 Other secondary cataract, bilateral: Secondary | ICD-10-CM | POA: Diagnosis not present

## 2019-11-14 DIAGNOSIS — H18003 Unspecified corneal deposit, bilateral: Secondary | ICD-10-CM | POA: Diagnosis not present

## 2019-11-14 DIAGNOSIS — H401213 Low-tension glaucoma, right eye, severe stage: Secondary | ICD-10-CM | POA: Diagnosis not present

## 2019-11-14 DIAGNOSIS — H401223 Low-tension glaucoma, left eye, severe stage: Secondary | ICD-10-CM | POA: Diagnosis not present

## 2019-11-17 ENCOUNTER — Other Ambulatory Visit: Payer: Self-pay

## 2019-11-17 ENCOUNTER — Encounter: Payer: Self-pay | Admitting: Cardiology

## 2019-11-17 ENCOUNTER — Ambulatory Visit: Payer: Medicare Other | Admitting: Cardiology

## 2019-11-17 ENCOUNTER — Encounter (INDEPENDENT_AMBULATORY_CARE_PROVIDER_SITE_OTHER): Payer: Self-pay

## 2019-11-17 VITALS — BP 148/82 | HR 69 | Ht 66.0 in | Wt 134.0 lb

## 2019-11-17 DIAGNOSIS — I5043 Acute on chronic combined systolic (congestive) and diastolic (congestive) heart failure: Secondary | ICD-10-CM | POA: Diagnosis not present

## 2019-11-17 DIAGNOSIS — I5042 Chronic combined systolic (congestive) and diastolic (congestive) heart failure: Secondary | ICD-10-CM

## 2019-11-17 DIAGNOSIS — I2510 Atherosclerotic heart disease of native coronary artery without angina pectoris: Secondary | ICD-10-CM

## 2019-11-17 DIAGNOSIS — I25118 Atherosclerotic heart disease of native coronary artery with other forms of angina pectoris: Secondary | ICD-10-CM | POA: Diagnosis not present

## 2019-11-17 DIAGNOSIS — I5032 Chronic diastolic (congestive) heart failure: Secondary | ICD-10-CM

## 2019-11-17 MED ORDER — ISOSORBIDE MONONITRATE ER 120 MG PO TB24: 120.0000 mg | ORAL_TABLET | Freq: Every day | ORAL | 1 refills | Status: DC

## 2019-11-17 NOTE — Progress Notes (Signed)
Cardiology Office Progress Note   Evaluation Performed:  Follow-up visit, 4 months  Date:  11/17/2019   ID:  Ryan [name], DOB [birth year], MRN [id number]  Patient Location: Home Provider Location: Home  PCP:  Dorothyann Peng, NP  Cardiologist:  Ena Dawley, MD  Electrophysiologist:  Dr Lovena Le  Reason for visit: 4 months follow up  History of Present Illness:    [name] is a 86 y.o. 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 He was doing better until 3 daysprior to [place] he developed worsening left low back pain and profound fatigue.  He was admitted on November 07, 2017 and diagnosed with eterococcal bacteremia withprosthetic valve endocarditis.  There was a large vegetation seen on bioprosthetic aortic valve, no vegetation was seen on pacemaker leads.Patient has been on IV antibiotics IV ampicillin and Rocephin for the endocarditis. Patient was discharged to skilled nursing facility to complete course of antibiotics, completed on December 22, 2017 During the hospital stay, the patient developed an acute kidney injury believed to be secondary to dehydration and possible endocarditis embolization. Patient's intractable back pain was further evaluated, CT showed spondylosis, mild to moderate multilevel foraminal stenosis, pars defect L 4-5.   Neurosurgery reassessed on 6/11 and indicatedthat his acute onset of back pain is due to 1 of 3 possibilities: Exacerbation of chronic  lumbar spondylosis, spontaneous spinal EDH related to Eliquis or discitis. He recommends nonoperative treatment, continue antibiotics for his bacteremia and if stroke risk felt to be high without Eliquis then consider resuming it.   01/27/2018 -the patient was readmitted on January 15, 2018 for complaint of like of energy and chest pain with elevated troponin.  Coronary CTA on August 16 showed widely patent mid RCA stent the remainder of anatomy and change, the etiology of his chest pain is not supported by any change in his coronary anatomy and medical therapy was recommended.  He was treated for acute combined systolic and diastolic heart failure.  Discharged on August 20 has 2019. Today he states that he has more energy, he finally feels little better, alternates exercises with naps during the day, denies any chest pain other than heartburn at night, no lower extremity edema, no orthopnea, paroxmal nocturnal dyspnea no fevers.  04/20/2018 -patient was readmitted on October 28 with recurrent fevers, he has been started on IV ampicillin and Rocephin, his leukocytosis was improved and he appears nontoxic, TEE did not show any recurrent aortic valve endocarditis.  He is blood cultures are negative.  He received PICC line to his right arm on April 01, 2018 and will receive IV penicillin with Rocephin for 6 weeks.  He is supposed to follow with ID service as well.  May 14, 2018 -the patient is coming after 4 weeks, he has been doing well he completed his 6-week course of  IV antibiotics and is on lifelong therapy with amoxicillin.  He is also better than he has felt in a long time he denies any fever or chills, no palpitations chest pain, no lower extremity edema orthopnea or paroxysmal  nocturnal dyspnea.  No bleeding.  06/17/2018 -this is 4 weeks follow-up the patient feels and looks great, he states he feels tired however he still able to perform 1 hour and stationary bike, use rowing machine and  walk for about 20 minutes a day.  He has a lot of interruptions and he sleeps but takes naps during the day so overall he gets 8 hours of sleep.  He denies any fever or chills.  He now uses Lasix only occasionally as needed as his lower extremity edema has resolved.  He use compression socks.  He denies any chest pain and overall just feels really well right now.  12/13/2018 -this is 1 months follow-up, the patient reports he has been doing great, his fatigue is slightly improved, he used to take 3 naps a day now only 1 nap a day he continues to walk in his pool without any chest pain or shortness of breath.  He stopped using Lasix every other day as his lower extremity edema has resolved and now uses it only PRN.  No palpitations and no fevers.  03/16/2019 -the patient with the same symptoms of fatigue and taking naps during the day but then inability to sleep at night.  However considering his age and diagnosis is doing incredibly well.  He is able to bike and walk daily and do push-ups against the wall.  He has very minimal lower extremity edema for which he drinks on Lasix however very occasionally.  He has occasional dizziness but no falls.  He states that he feels like the more short of breath.  07/20/2019 - the patient has been doing great, he uses stationary bike daily for an hour, then he walks with his wife outside. He experiences mild DOE while walking but not while biking. He denies any chest pain, no palpitations, no bleeding, no falls.   11/17/2019 -the patient is coming after 4 months, he has been doing great, he continues to swim and use stationary bike on a daily basis, he exercises 70 to 80 minutes a day, he gets tired when he walks.  He denies any palpitation dizziness or syncope.  No lower extremity edema.  No need to use Lasix.  No bleeding just some bruising.  The patient does not have symptoms concerning for COVID-19 infection (fever, chills, cough, or new shortness of breath).    Past Medical History:  Diagnosis Date  . Age-related macular degeneration   . Allergic rhinitis   . 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 Anemia 12/2017   normocytic.  iron deficient, ferritin 17 04/2018  . Arthritis    "hands, lower back" (04/30/2016)  . Asthma   . Bacterial endocarditis   . Carotid artery obstruction    a. s/p bilat CEA.  . Chronic combined systolic (congestive) and diastolic (congestive) heart failure ([place]) 03/18/2016  . Chronic lower back pain   . CKD (chronic kidney disease) stage 4, GFR 15-29 ml/min (HCC) 12/2017  . Claudication in peripheral vascular disease ([place]) 01/01/2018   Claudication  . Coronary arteriosclerosis in native artery    a. 2017 Cardiac catheterization demonstrated worsening CAD with 70% mid LCx, 80% OM1 and 80% mid RCA stenosis. b.  In 11/17, he underwent successful rotational atherectomy and DES to RCA.  Marland Kitchen Decreased diffusion capacity 10/23/2017  . Diverticulitis of colon   . DJD (degenerative joint disease)   . Elevated TSH 06/30/2017  . GERD (gastroesophageal reflux disease)   . History of hiatal hernia   .  HLD (hyperlipidemia)   . Hypertension   . Hypertensive heart disease with heart failure ([place])   . Intractable back pain 11/07/2017  . OSA (obstructive sleep apnea)    intolerant to CPAP  . PAF (paroxysmal atrial fibrillation) ([place])   . Paroxysmal supraventricular tachycardia ([place])   . Presence of permanent cardiac pacemaker   . Primary malignant neoplasm of bladder ([place])   . Prostate cancer ([place])   . 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 S/P AVR 12/02/2017  . Syncope 10/23/2017  . Tachy-brady syndrome ([place])   . Thrombocytopenia ([place]) 11/07/2017  . Urinary retention 11/07/2017   Past Surgical History:  Procedure Laterality Date  . APPENDECTOMY    . CARDIAC CATHETERIZATION N/A 01/17/2016   Procedure: Right/Left Heart Cath and Coronary Angiography;  Surgeon: Belva Crome, MD;  Location: [place] [facility];  Service: Cardiovascular;  Laterality: N/A;  . CARDIAC CATHETERIZATION N/A 04/30/2016   Procedure: Coronary/Graft Atherectomy;  Surgeon: Sherren Mocha, MD;  Location: [place] [facility];  Service: Cardiovascular;  Laterality: N/A;  . CAROTID ENDARTERECTOMY Bilateral   . CATARACT EXTRACTION W/ INTRAOCULAR LENS  IMPLANT, BILATERAL Bilateral   . COLON SURGERY  1981   Meckles Diverticulum with volvulus  . COLONOSCOPY WITH PROPOFOL N/A 05/01/2018   Procedure: COLONOSCOPY WITH PROPOFOL;  Surgeon: Rush Landmark Telford Nab., MD;  Location: [place];  Service: Gastroenterology;  Laterality: N/A;  . CORONARY ANGIOGRAPHY N/A 01/18/2018   Procedure: CORONARY ANGIOGRAPHY;  Surgeon: Lorretta Harp, MD;  Location: [place] [facility];  Service: Cardiovascular;  Laterality: N/A;  . ENTEROSCOPY N/A 04/30/2018   Procedure: ENTEROSCOPY;  Surgeon: Rush Landmark Telford Nab., MD;  Location: [facility];  Service: Gastroenterology;  Laterality: N/A;  . EP IMPLANTABLE DEVICE N/A 03/21/2016   Procedure: Pacemaker Implant;  Surgeon: Evans Lance, MD;  Location: [place] [facility];  Service: Cardiovascular;  Laterality: N/A;  . EYE SURGERY    . INGUINAL HERNIA REPAIR Right 2009  . INSERT / REPLACE / REMOVE PACEMAKER    . INSERTION PROSTATE RADIATION SEED    . POLYPECTOMY  05/01/2018   Procedure: POLYPECTOMY;  Surgeon: Mansouraty, Telford Nab., MD;  Location: [facility];  Service: Gastroenterology;;  . TEE WITHOUT CARDIOVERSION N/A 06/10/2016   Procedure: TRANSESOPHAGEAL ECHOCARDIOGRAM (TEE);  Surgeon: Sherren Mocha, MD;  Location: [place];  Service: Open Heart Surgery;  Laterality: N/A;  . TEE WITHOUT CARDIOVERSION N/A 11/10/2017   Procedure: TRANSESOPHAGEAL ECHOCARDIOGRAM (TEE);  Surgeon: Acie Fredrickson Wonda Cheng, MD;  Location: [facility] ENDOSCOPY;  Service: Cardiovascular;  Laterality: N/A;  . TEE WITHOUT CARDIOVERSION N/A 12/15/2017   Procedure: TRANSESOPHAGEAL ECHOCARDIOGRAM (TEE);  Surgeon: Buford Dresser, MD;  Location: [facility] ENDOSCOPY;  Service: Cardiovascular;  Laterality: N/A;  . TEE WITHOUT CARDIOVERSION N/A 03/30/2018   Procedure: TRANSESOPHAGEAL ECHOCARDIOGRAM (TEE);  Surgeon: Jerline Pain, MD;  Location: [facility] ENDOSCOPY;  Service: Cardiovascular;  Laterality: N/A;  . TONSILLECTOMY  ~ 1947  . TRANSCATHETER AORTIC VALVE REPLACEMENT, TRANSFEMORAL N/A 06/10/2016   Procedure: TRANSCATHETER AORTIC VALVE REPLACEMENT, TRANSFEMORAL;  Surgeon: Sherren Mocha, MD;  Location: [place];  Service: Open Heart Surgery;  Laterality: N/A;    Current Meds  Medication Sig  . albuterol (VENTOLIN HFA) 108 (90 Base) MCG/ACT inhaler Inhale 2 puffs into the lungs every 6 (six) hours as needed for wheezing or shortness of breath.  Marland Kitchen amLODipine (NORVASC) 5 MG tablet TAKE 1 TABLET(5 MG) BY MOUTH DAILY  . amoxicillin (AMOXIL) 500 MG tablet Take 1 tablet (500 mg  total) by mouth 2 (two) times daily.  Marland Kitchen atorvastatin (LIPITOR) 40 MG tablet TAKE 1 TABLET(40 MG) BY MOUTH DAILY AT 6 PM  . cholecalciferol (VITAMIN D) 1000 units tablet Take 1,000 Units by mouth 2 (two) times a week.   Marland Kitchen ELIQUIS 2.5 MG TABS tablet TAKE 1 TABLET(2.5 MG) BY MOUTH TWICE DAILY  . furosemide (LASIX) 20 MG tablet Take 1 tablet (20 mg total) by mouth daily as needed for fluid or edema.  Marland Kitchen latanoprost (XALATAN) 0.005 % ophthalmic solution Place 1 drop into both eyes at bedtime.  Marland Kitchen levothyroxine (SYNTHROID) 25 MCG tablet TAKE 1/2 TABLET BY MOUTH EVERY DAY BEFORE BREAKFAST  . metoprolol succinate (TOPROL-XL) 100 MG 24 hr tablet TAKE 1 TABLET BY MOUTH DAILY, TAKE WITH OR IMMEDIATELY FOLLOWING A MEAL  . Netarsudil Dimesylate (RHOPRESSA) 0.02 % SOLN Place 1 drop into both eyes at bedtime.  . nitroGLYCERIN (NITROSTAT) 0.4 MG SL tablet Place 1 tablet (0.4 mg total) under the tongue every 5 (five) minutes x 3 doses as needed for chest pain.  . pantoprazole (PROTONIX) 40 MG tablet Take 40 mg by mouth as needed.  . polyethylene glycol (MIRALAX / GLYCOLAX) 17  g packet Take 17 g by mouth daily as needed.  Marland Kitchen UNABLE TO FIND Take 1 tablet by mouth daily. Megafood blood builder vit c, folate, b12, iron and beet root.  . vitamin B-12 (CYANOCOBALAMIN) 1000 MCG tablet Take 1,000 mcg by mouth daily.  . [DISCONTINUED] isosorbide mononitrate (IMDUR) 60 MG 24 hr tablet Take 1 tablet (60 mg total) by mouth daily.    Allergies:   Ciprofloxacin, Hydrochlorothiazide, Sulfa antibiotics, Ace inhibitors, Losartan, Carvedilol, Lisinopril, and Soy allergy   Social History   Tobacco Use  . Smoking status: Never Smoker  . Smokeless tobacco: Never Used  Vaping Use  . Vaping Use: Never used  Substance Use Topics  . Alcohol use: No    Alcohol/week: 0.0 standard drinks    Comment: 04/30/2016 "nothing in years"  . Drug use: No    Family Hx: The patient's family history includes Heart disease in his father and mother.  ROS:   Please see the history of present illness.    All other systems reviewed and are negative.  Prior CV studies:   The following studies were reviewed today:  Labs/Other Tests and Data Reviewed:    EKG: Atrial pacing, left bundle branch block, unchanged from prior.  Recent Labs: 07/26/2019: ALT 12; BUN 37; Creatinine, Ser 2.00; Hemoglobin 15.3; NT-Pro BNP 419; Platelets 178; Potassium 4.3; Sodium 143; TSH 2.710   Recent Lipid Panel Lab Results  Component Value Date/Time   CHOL 126 07/26/2019 07:56 AM   TRIG 110 07/26/2019 07:56 AM   HDL 64 07/26/2019 07:56 AM   CHOLHDL 2.0 07/26/2019 07:56 AM   CHOLHDL 2 05/01/2017 08:27 AM   LDLCALC 42 07/26/2019 07:56 AM   Wt Readings from Last 3 Encounters:  11/17/19 134 lb (60.8 kg)  07/20/19 132 lb (59.9 kg)  03/16/19 132 lb (59.9 kg)    Objective:    Vital Signs:  BP (!) 148/82   Pulse 69   Ht 5\' 6"  (1.676 m)   Wt 134 lb (60.8 kg)   SpO2 95%   BMI 21.63 kg/m    VITAL SIGNS:  reviewed    ASSESSMENT & PLAN:    1.  CAD -last cath in August 2019 showed Prox Cx lesion is 70%  stenosed. Ost 1st Mrg lesion is 75% stenosed. Previously placed Mid RCA  stent (unknown type) is widely patent. Prox RCA lesion is 40% stenosed. Post Atrio lesion is 75% stenosed. Ost LAD to Prox LAD lesion is 30% stenosed. - the patient remains very active with 1.5 hour of physical activity daily with minimal symptoms, he is congratulated on that.  - we will increase Imdur to 120 mg daily as he is hypertensive, continue Metoprolol, Atorvastatin, no aspirin as he is on Eliquis.  2. Bioprosthetic aortic valve endocarditis -diagnosed on November 13, 2017, completed 6 weeks course of IV antibiotics and on lifelong Ampicillin.  He takes it with probiotics and has no significant diarrhea.  He has no recurrent fever, no evidence of endocarditis on TEE on March 30, 2018. Afebrile.  We will repeat echocardiogram now.  3.  Paroxysmal atrial fibrillation, he is maintaining sinus rhythm, on Eliquis, has no bleeding, on most recent pacemaker interrogation his A. fib burden is less than 0.1%.  Hemoglobin 15.3.  4.    Acute on chronic combined CHF with cardiomyopathy - LV function has declined over the last several years.    He is now functional class NYHA IIb.  We will repeat echocardiogram now.  5. Sick sinus syndrome, status post pacemaker placement, working normally, followed by Dr. Lovena Le.  COVID-19 Education: The signs and symptoms of COVID-19 were discussed with the patient and how to seek care for testing (follow up with PCP or arrange E-visit).  The importance of social distancing was discussed today.  Time:   Today, I have spent 25 minutes with the patient with telehealth technology discussing the above problems.     Medication Adjustments/Labs and Tests Ordered: Current medicines are reviewed at length with the patient today.  Concerns regarding medicines are outlined above.   Tests Ordered: No orders of the defined types were placed in this encounter.   Medication Changes: No orders of the  defined types were placed in this encounter.   Disposition:  Follow up in 4 months  Signed, Ena Dawley, MD  11/17/2019 9:53 AM    [place]

## 2019-11-17 NOTE — Patient Instructions (Signed)
Medication Instructions:   INCREASE YOUR ISOSORBIDE MONONITRATE (IMDUR) TO 120 MG BY MOUTH DAILY  *If you need a refill on your cardiac medications before your next appointment, please call your pharmacy*   Testing/Procedures:  Your physician has requested that you have an echocardiogram. Echocardiography is a painless test that uses sound waves to create images of your heart. It provides your doctor with information about the size and shape of your heart and how well your hearts chambers and valves are working. This procedure takes approximately one hour. There are no restrictions for this procedure.  Follow-Up:  4 MONTHS IN PERSON WITH DR. Meda Coffee

## 2019-11-24 ENCOUNTER — Telehealth: Payer: Self-pay | Admitting: Cardiology

## 2019-11-24 NOTE — Telephone Encounter (Signed)
New message   Pt c/o medication issue:  1. Name of Medication: isosorbide mononitrate (IMDUR) 120 MG 24 hr tablet  2. How are you currently taking this medication (dosage and times per day)? As written  3. Are you having a reaction (difficulty breathing--STAT)?no  4. What is your medication issue? Patient's wife states that this medication is causing his b/p to be lower. Please advise.

## 2019-11-24 NOTE — Telephone Encounter (Signed)
Pt and wife was calling to report BP/HR values since Dr. Meda Coffee increased his Imdur to 120 mg po daily at last OV on 11/17/19.  Wife states she is monitoring his vitals before taking the medication, one hour after medication administration, and around 3 pm, everyday. Wife states when the pt wakes up his blood pressure usually runs in the 456Y systolic.  Wife states this morning 153/83 HR-71. Wife states after he takes the med, she waits one hour and takes his BP and it runs 563S systolic- today being 937/34 HR-81.  She states at this current time, it is 124/73 HR-78.  Wife states he is asymptomatic, just a little tired, but this is his norm. Wife is inquiring if his BP is too low.  Advised the pts wife that his BP and HR are WNL's while on this medication.  Advised the pts wife if his systolic gets 287 or below, and/or he becomes symptomatic, then she should inform us, for he may need his Imdur dose reduced back to his prior dose.  Advised the pts wife to have him continue his current med regimen, continue to monitor his numbers as needed, report any alarming values, make sure he's staying plenty hydrated with water, continue his activity level of walking daily, and eat a nutritious diet.  Informed the pts wife that I will route this communication to Dr. Meda Coffee to further review and advise on if needed.  Informed the pts wife if she has any other recommendations, I will call them back.  Wife verbalized understanding and agrees with this plan.  Both were more than gracious for all the assistance provided.

## 2019-11-24 NOTE — Telephone Encounter (Signed)
I would continue for now, have her call us in 1-2 weeks

## 2019-11-24 NOTE — Telephone Encounter (Signed)
Spoke with the pts wife and informed her that per Dr. Meda Coffee, she would like for the pt to continue his regimen for now, and call us in 1-2 weeks for an update.  Advised the pts wife to have him come in as planned for his echo on 7/13.  Wife verbalized understanding and agrees with this plan.

## 2019-11-26 ENCOUNTER — Other Ambulatory Visit: Payer: Self-pay | Admitting: Cardiology

## 2019-11-26 ENCOUNTER — Other Ambulatory Visit: Payer: Self-pay | Admitting: Internal Medicine

## 2019-11-26 DIAGNOSIS — R7989 Other specified abnormal findings of blood chemistry: Secondary | ICD-10-CM

## 2019-11-28 NOTE — Telephone Encounter (Signed)
Pt's pharmacy is requesting a refill on levothyroxine. Would Dr. Nelson like to refill this medication? Please address 

## 2019-12-02 ENCOUNTER — Telehealth: Payer: Self-pay | Admitting: Cardiology

## 2019-12-02 MED ORDER — ISOSORBIDE MONONITRATE ER 60 MG PO TB24
60.0000 mg | ORAL_TABLET | Freq: Every day | ORAL | 3 refills | Status: DC
Start: 2019-12-02 — End: 2020-09-10

## 2019-12-02 NOTE — Telephone Encounter (Signed)
I spoke with patient's wife and told her Dr Meda Coffee recommends patient decrease Imdur to 60 mg daily. Will send updated prescription to [facility]. Wife will continue to monitor BP and call if problems.

## 2019-12-02 NOTE — Telephone Encounter (Signed)
     Pt c/o BP issue: STAT if pt c/o blurred vision, one-sided weakness or slurred speech  1. What are your last 5 BP readings? 94/59 HR 61  2. Are you having any other symptoms (ex. Dizziness, headache, blurred vision, passed out)? Dizzy and weak  3. What is your BP issue? Pt's wife calling she said they were told by Dr. Meda Coffee if pt is not feeling well to call, she said pt"s BP has been low and been feeling weak and dizzy

## 2019-12-02 NOTE — Telephone Encounter (Signed)
Yes, he can go back to his original dose

## 2019-12-02 NOTE — Telephone Encounter (Signed)
I spoke with patient's wife. She reports patient's BP this morning was 156/82 and pulse 66 prior to taking AM medications. He took AM meds at 8:15.  Around 10:30 he was outside cleaning his pool and started not feeling well.  He went inside and BP was 104-109/50-60 and then 94/59.  His wife gave him apple juice and salted cracker, elevated his legs and applied compression socks.  Currently he is feeling better and last BP was 123/71 and pulse 66. Prior to today was not having any problems with BP. Imdur was increased at recent office visit and last phone note indicates it may need to be decreased back to previous dose if BP becomes low. I advised patient's wife to decrease Imdur back to previous dose of 60 mg daily and make sure patient stays hydrated.  I asked her to continue to monitor BP and to call next week with an update or sooner if patient developed any problems.

## 2019-12-02 NOTE — Telephone Encounter (Signed)
I placed call to patient's wife and left message to call office

## 2019-12-13 ENCOUNTER — Ambulatory Visit (HOSPITAL_COMMUNITY): Payer: Medicare Other | Attending: Cardiology

## 2019-12-13 ENCOUNTER — Other Ambulatory Visit: Payer: Self-pay

## 2019-12-13 DIAGNOSIS — I25118 Atherosclerotic heart disease of native coronary artery with other forms of angina pectoris: Secondary | ICD-10-CM | POA: Diagnosis not present

## 2019-12-13 DIAGNOSIS — I2510 Atherosclerotic heart disease of native coronary artery without angina pectoris: Secondary | ICD-10-CM | POA: Diagnosis not present

## 2019-12-13 DIAGNOSIS — I5042 Chronic combined systolic (congestive) and diastolic (congestive) heart failure: Secondary | ICD-10-CM | POA: Diagnosis not present

## 2019-12-13 DIAGNOSIS — I5032 Chronic diastolic (congestive) heart failure: Secondary | ICD-10-CM

## 2019-12-13 DIAGNOSIS — I5043 Acute on chronic combined systolic (congestive) and diastolic (congestive) heart failure: Secondary | ICD-10-CM

## 2019-12-14 ENCOUNTER — Other Ambulatory Visit: Payer: Self-pay | Admitting: Cardiology

## 2019-12-14 ENCOUNTER — Telehealth: Payer: Self-pay | Admitting: *Deleted

## 2019-12-14 DIAGNOSIS — I110 Hypertensive heart disease with heart failure: Secondary | ICD-10-CM

## 2019-12-14 DIAGNOSIS — I5043 Acute on chronic combined systolic (congestive) and diastolic (congestive) heart failure: Secondary | ICD-10-CM

## 2019-12-14 DIAGNOSIS — Z5181 Encounter for therapeutic drug level monitoring: Secondary | ICD-10-CM

## 2019-12-14 DIAGNOSIS — D649 Anemia, unspecified: Secondary | ICD-10-CM

## 2019-12-14 DIAGNOSIS — I480 Paroxysmal atrial fibrillation: Secondary | ICD-10-CM

## 2019-12-14 DIAGNOSIS — Z7901 Long term (current) use of anticoagulants: Secondary | ICD-10-CM

## 2019-12-14 DIAGNOSIS — I2510 Atherosclerotic heart disease of native coronary artery without angina pectoris: Secondary | ICD-10-CM

## 2019-12-14 NOTE — Telephone Encounter (Signed)
-----   Message from Dorothy Spark, MD sent at 12/14/2019 12:13 PM EDT ----- Karlene Einstein, Can you please schedule CBC, BNP and CMP sometimes in the next 2 weeks for this patient?Thank you, KN

## 2019-12-14 NOTE — Telephone Encounter (Signed)
-----   Message from Dorothy Spark, MD sent at 12/14/2019 12:21 PM EDT ----- Stable low normal LVEF 50-55%, normal transaortic gradients across the TAVR valve

## 2019-12-14 NOTE — Telephone Encounter (Signed)
Prescription refill request for Eliquis received.  Last office visit: Ryan [name] 11/17/2019 Scr: 2.00, 07/26/2019 Age: 86 y.o. Weight: 60.8 kg   Prescription refill sent.

## 2019-12-14 NOTE — Telephone Encounter (Signed)
Spoke with both the pt and wife and informed them of echo results and recommendations per Dr. Meda Coffee. Informed both parties that she would like for him to come in, in 2 weeks, to have his labs checked.  We will check cmet, cbc, and pro-bnp at that time. Scheduled the pt to come in on 7/28, to have the following labs drawn. Both verbalized understanding and agrees with this plan.

## 2019-12-28 ENCOUNTER — Other Ambulatory Visit: Payer: Medicare Other | Admitting: *Deleted

## 2019-12-28 ENCOUNTER — Other Ambulatory Visit: Payer: Self-pay

## 2019-12-28 DIAGNOSIS — I480 Paroxysmal atrial fibrillation: Secondary | ICD-10-CM

## 2019-12-28 DIAGNOSIS — I110 Hypertensive heart disease with heart failure: Secondary | ICD-10-CM

## 2019-12-28 DIAGNOSIS — I2510 Atherosclerotic heart disease of native coronary artery without angina pectoris: Secondary | ICD-10-CM | POA: Diagnosis not present

## 2019-12-28 DIAGNOSIS — Z5181 Encounter for therapeutic drug level monitoring: Secondary | ICD-10-CM

## 2019-12-28 DIAGNOSIS — I5043 Acute on chronic combined systolic (congestive) and diastolic (congestive) heart failure: Secondary | ICD-10-CM | POA: Diagnosis not present

## 2019-12-28 DIAGNOSIS — D649 Anemia, unspecified: Secondary | ICD-10-CM

## 2019-12-29 LAB — COMPREHENSIVE METABOLIC PANEL WITH GFR
ALT: 10 IU/L (ref 0–44)
AST: 20 IU/L (ref 0–40)
Albumin/Globulin Ratio: 2 (ref 1.2–2.2)
Albumin: 4.3 g/dL (ref 3.6–4.6)
Alkaline Phosphatase: 79 IU/L (ref 48–121)
BUN/Creatinine Ratio: 22 (ref 10–24)
BUN: 41 mg/dL — ABNORMAL HIGH (ref 8–27)
Bilirubin Total: 0.5 mg/dL (ref 0.0–1.2)
CO2: 22 mmol/L (ref 20–29)
Calcium: 9.4 mg/dL (ref 8.6–10.2)
Chloride: 102 mmol/L (ref 96–106)
Creatinine, Ser: 1.84 mg/dL — ABNORMAL HIGH (ref 0.76–1.27)
GFR calc Af Amer: 37 mL/min/1.73 — ABNORMAL LOW
GFR calc non Af Amer: 32 mL/min/1.73 — ABNORMAL LOW
Globulin, Total: 2.1 g/dL (ref 1.5–4.5)
Glucose: 95 mg/dL (ref 65–99)
Potassium: 4.6 mmol/L (ref 3.5–5.2)
Sodium: 140 mmol/L (ref 134–144)
Total Protein: 6.4 g/dL (ref 6.0–8.5)

## 2019-12-29 LAB — CBC
Hematocrit: 42.4 % (ref 37.5–51.0)
Hemoglobin: 14.5 g/dL (ref 13.0–17.7)
MCH: 32.2 pg (ref 26.6–33.0)
MCHC: 34.2 g/dL (ref 31.5–35.7)
MCV: 94 fL (ref 79–97)
Platelets: 160 10*3/uL (ref 150–450)
RBC: 4.5 x10E6/uL (ref 4.14–5.80)
RDW: 12.6 % (ref 11.6–15.4)
WBC: 6.5 10*3/uL (ref 3.4–10.8)

## 2019-12-29 LAB — PRO B NATRIURETIC PEPTIDE: NT-Pro BNP: 326 pg/mL (ref 0–486)

## 2020-01-09 ENCOUNTER — Ambulatory Visit (INDEPENDENT_AMBULATORY_CARE_PROVIDER_SITE_OTHER): Payer: Medicare Other | Admitting: *Deleted

## 2020-01-09 DIAGNOSIS — I495 Sick sinus syndrome: Secondary | ICD-10-CM

## 2020-01-09 LAB — CUP PACEART REMOTE DEVICE CHECK
Battery Impedance: 234 Ohm
Battery Remaining Longevity: 115 mo
Battery Voltage: 2.79 V
Brady Statistic AP VP Percent: 0 %
Brady Statistic AP VS Percent: 97 %
Brady Statistic AS VP Percent: 0 %
Brady Statistic AS VS Percent: 3 %
Date Time Interrogation Session: 20210809100143
Implantable Lead Implant Date: 20171020
Implantable Lead Implant Date: 20171020
Implantable Lead Location: 753859
Implantable Lead Location: 753860
Implantable Lead Model: 5076
Implantable Lead Model: 5076
Implantable Pulse Generator Implant Date: 20171020
Lead Channel Impedance Value: 473 Ohm
Lead Channel Impedance Value: 632 Ohm
Lead Channel Pacing Threshold Amplitude: 0.625 V
Lead Channel Pacing Threshold Amplitude: 0.625 V
Lead Channel Pacing Threshold Pulse Width: 0.4 ms
Lead Channel Pacing Threshold Pulse Width: 0.4 ms
Lead Channel Setting Pacing Amplitude: 2 V
Lead Channel Setting Pacing Amplitude: 2.5 V
Lead Channel Setting Pacing Pulse Width: 0.4 ms
Lead Channel Setting Sensing Sensitivity: 4 mV

## 2020-01-10 NOTE — Progress Notes (Signed)
Remote pacemaker transmission.   

## 2020-02-11 ENCOUNTER — Other Ambulatory Visit: Payer: Self-pay | Admitting: Cardiology

## 2020-02-13 NOTE — Telephone Encounter (Signed)
Pt's pharmacy is requesting a refill on albuterol inhaler. Would Dr. Meda Coffee like to refill this medication? Please address

## 2020-02-13 NOTE — Telephone Encounter (Signed)
Yes please refill. Thanks. 

## 2020-03-02 ENCOUNTER — Other Ambulatory Visit: Payer: Self-pay

## 2020-03-02 ENCOUNTER — Telehealth: Payer: Self-pay | Admitting: Adult Health

## 2020-03-02 ENCOUNTER — Other Ambulatory Visit: Payer: Medicare Other

## 2020-03-02 ENCOUNTER — Other Ambulatory Visit: Payer: Self-pay | Admitting: Adult Health

## 2020-03-02 DIAGNOSIS — G8929 Other chronic pain: Secondary | ICD-10-CM | POA: Diagnosis not present

## 2020-03-02 DIAGNOSIS — M545 Low back pain, unspecified: Secondary | ICD-10-CM

## 2020-03-02 LAB — CBC WITH DIFFERENTIAL/PLATELET
Absolute Monocytes: 809 cells/uL (ref 200–950)
Basophils Absolute: 72 cells/uL (ref 0–200)
Basophils Relative: 1.3 %
Eosinophils Absolute: 160 cells/uL (ref 15–500)
Eosinophils Relative: 2.9 %
HCT: 42.6 % (ref 38.5–50.0)
Hemoglobin: 14.1 g/dL (ref 13.2–17.1)
Lymphs Abs: 1287 cells/uL (ref 850–3900)
MCH: 31.1 pg (ref 27.0–33.0)
MCHC: 33.1 g/dL (ref 32.0–36.0)
MCV: 94 fL (ref 80.0–100.0)
MPV: 10.9 fL (ref 7.5–12.5)
Monocytes Relative: 14.7 %
Neutro Abs: 3174 cells/uL (ref 1500–7800)
Neutrophils Relative %: 57.7 %
Platelets: 156 10*3/uL (ref 140–400)
RBC: 4.53 10*6/uL (ref 4.20–5.80)
RDW: 12.4 % (ref 11.0–15.0)
Total Lymphocyte: 23.4 %
WBC: 5.5 10*3/uL (ref 3.8–10.8)

## 2020-03-02 NOTE — Telephone Encounter (Signed)
Spoke to patient's son-in-law, Dr. Wilfrid Lund who informed me that the patient has been having a few days of slowly escalating back pain without fever.  This may be due to osteoarthritis but this is how the patient also presented with an did discitis from endocarditis.  I will order a CBC with differential and to order some blood cultures

## 2020-03-03 ENCOUNTER — Inpatient Hospital Stay (HOSPITAL_COMMUNITY)
Admission: EM | Admit: 2020-03-03 | Discharge: 2020-03-06 | DRG: 552 | Disposition: A | Discharge status: Home / Self Care | Payer: Medicare Other | Attending: Internal Medicine | Admitting: Internal Medicine

## 2020-03-03 ENCOUNTER — Encounter (HOSPITAL_COMMUNITY): Payer: Self-pay | Admitting: *Deleted

## 2020-03-03 ENCOUNTER — Telehealth: Payer: Self-pay | Admitting: Family Medicine

## 2020-03-03 ENCOUNTER — Other Ambulatory Visit: Payer: Self-pay

## 2020-03-03 DIAGNOSIS — Z79890 Hormone replacement therapy (postmenopausal): Secondary | ICD-10-CM

## 2020-03-03 DIAGNOSIS — E039 Hypothyroidism, unspecified: Secondary | ICD-10-CM | POA: Diagnosis present

## 2020-03-03 DIAGNOSIS — K219 Gastro-esophageal reflux disease without esophagitis: Secondary | ICD-10-CM | POA: Diagnosis present

## 2020-03-03 DIAGNOSIS — G4733 Obstructive sleep apnea (adult) (pediatric): Secondary | ICD-10-CM | POA: Diagnosis not present

## 2020-03-03 DIAGNOSIS — I5042 Chronic combined systolic (congestive) and diastolic (congestive) heart failure: Secondary | ICD-10-CM | POA: Diagnosis present

## 2020-03-03 DIAGNOSIS — Z7901 Long term (current) use of anticoagulants: Secondary | ICD-10-CM

## 2020-03-03 DIAGNOSIS — R7881 Bacteremia: Secondary | ICD-10-CM | POA: Diagnosis present

## 2020-03-03 DIAGNOSIS — E785 Hyperlipidemia, unspecified: Secondary | ICD-10-CM | POA: Diagnosis present

## 2020-03-03 DIAGNOSIS — R350 Frequency of micturition: Secondary | ICD-10-CM | POA: Diagnosis not present

## 2020-03-03 DIAGNOSIS — I2510 Atherosclerotic heart disease of native coronary artery without angina pectoris: Secondary | ICD-10-CM | POA: Diagnosis not present

## 2020-03-03 DIAGNOSIS — I480 Paroxysmal atrial fibrillation: Secondary | ICD-10-CM | POA: Diagnosis present

## 2020-03-03 DIAGNOSIS — B9689 Other specified bacterial agents as the cause of diseases classified elsewhere: Secondary | ICD-10-CM | POA: Diagnosis present

## 2020-03-03 DIAGNOSIS — G8929 Other chronic pain: Secondary | ICD-10-CM | POA: Diagnosis present

## 2020-03-03 DIAGNOSIS — Z8551 Personal history of malignant neoplasm of bladder: Secondary | ICD-10-CM

## 2020-03-03 DIAGNOSIS — Z8249 Family history of ischemic heart disease and other diseases of the circulatory system: Secondary | ICD-10-CM | POA: Diagnosis not present

## 2020-03-03 DIAGNOSIS — Z8679 Personal history of other diseases of the circulatory system: Secondary | ICD-10-CM | POA: Diagnosis not present

## 2020-03-03 DIAGNOSIS — I130 Hypertensive heart and chronic kidney disease with heart failure and stage 1 through stage 4 chronic kidney disease, or unspecified chronic kidney disease: Secondary | ICD-10-CM | POA: Diagnosis present

## 2020-03-03 DIAGNOSIS — Z79899 Other long term (current) drug therapy: Secondary | ICD-10-CM | POA: Diagnosis not present

## 2020-03-03 DIAGNOSIS — F419 Anxiety disorder, unspecified: Secondary | ICD-10-CM | POA: Diagnosis present

## 2020-03-03 DIAGNOSIS — N184 Chronic kidney disease, stage 4 (severe): Secondary | ICD-10-CM | POA: Diagnosis not present

## 2020-03-03 DIAGNOSIS — Z20822 Contact with and (suspected) exposure to covid-19: Secondary | ICD-10-CM | POA: Diagnosis not present

## 2020-03-03 DIAGNOSIS — Z8546 Personal history of malignant neoplasm of prostate: Secondary | ICD-10-CM | POA: Diagnosis not present

## 2020-03-03 DIAGNOSIS — Z952 Presence of prosthetic heart valve: Secondary | ICD-10-CM

## 2020-03-03 DIAGNOSIS — M4646 Discitis, unspecified, lumbar region: Principal | ICD-10-CM | POA: Diagnosis present

## 2020-03-03 DIAGNOSIS — M545 Low back pain, unspecified: Secondary | ICD-10-CM | POA: Diagnosis not present

## 2020-03-03 DIAGNOSIS — Z950 Presence of cardiac pacemaker: Secondary | ICD-10-CM | POA: Diagnosis not present

## 2020-03-03 DIAGNOSIS — I10 Essential (primary) hypertension: Secondary | ICD-10-CM | POA: Diagnosis present

## 2020-03-03 LAB — BASIC METABOLIC PANEL
Anion gap: 10 (ref 5–15)
BUN: 40 mg/dL — ABNORMAL HIGH (ref 8–23)
CO2: 24 mmol/L (ref 22–32)
Calcium: 9.5 mg/dL (ref 8.9–10.3)
Chloride: 105 mmol/L (ref 98–111)
Creatinine, Ser: 2.13 mg/dL — ABNORMAL HIGH (ref 0.61–1.24)
GFR calc Af Amer: 31 mL/min — ABNORMAL LOW (ref >60)
GFR calc non Af Amer: 27 mL/min — ABNORMAL LOW (ref >60)
Glucose, Bld: 127 mg/dL — ABNORMAL HIGH (ref 70–99)
Potassium: 4.5 mmol/L (ref 3.5–5.1)
Sodium: 139 mmol/L (ref 135–145)

## 2020-03-03 LAB — CBC
HCT: 46.1 % (ref 39.0–52.0)
Hemoglobin: 14.6 g/dL (ref 13.0–17.0)
MCH: 30.2 pg (ref 26.0–34.0)
MCHC: 31.7 g/dL (ref 30.0–36.0)
MCV: 95.4 fL (ref 80.0–100.0)
Platelets: 147 10*3/uL — ABNORMAL LOW (ref 150–400)
RBC: 4.83 MIL/uL (ref 4.22–5.81)
RDW: 12.9 % (ref 11.5–15.5)
WBC: 5.4 10*3/uL (ref 4.0–10.5)
nRBC: 0 % (ref 0.0–0.2)

## 2020-03-03 NOTE — Telephone Encounter (Signed)
Received call from [place] at [facility]. They took call from lab with critical result of blood culture with gram positive cocci in clusters. Spoke with patient's wife Ryan [name], daughter Ryan [name] and son-in-law Dr Loletha Carrow. 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 He was found to have disciitis treated with antibiotics and improved. He las had a PIC line about a year ago and follows with Dr Linus Salmons of Infectious Disease. Given his significant history and blood culture preliminary as noted above he is sent to [facility] emergency room for further evaluation and the charge nurse at the ER is notified.

## 2020-03-03 NOTE — ED Triage Notes (Signed)
Pt states that has had back pain for about a week, denies fevers. blood drawn at PCP for the same +blood cultures/cbc. hx of endocarditis and Discitis.

## 2020-03-04 ENCOUNTER — Inpatient Hospital Stay (HOSPITAL_COMMUNITY): Payer: Medicare Other

## 2020-03-04 DIAGNOSIS — M4646 Discitis, unspecified, lumbar region: Secondary | ICD-10-CM | POA: Diagnosis present

## 2020-03-04 DIAGNOSIS — F419 Anxiety disorder, unspecified: Secondary | ICD-10-CM | POA: Diagnosis present

## 2020-03-04 DIAGNOSIS — Z950 Presence of cardiac pacemaker: Secondary | ICD-10-CM | POA: Diagnosis not present

## 2020-03-04 DIAGNOSIS — Z79899 Other long term (current) drug therapy: Secondary | ICD-10-CM | POA: Diagnosis not present

## 2020-03-04 DIAGNOSIS — R7881 Bacteremia: Secondary | ICD-10-CM | POA: Diagnosis present

## 2020-03-04 DIAGNOSIS — Z8546 Personal history of malignant neoplasm of prostate: Secondary | ICD-10-CM | POA: Diagnosis not present

## 2020-03-04 DIAGNOSIS — R350 Frequency of micturition: Secondary | ICD-10-CM | POA: Diagnosis present

## 2020-03-04 DIAGNOSIS — B9689 Other specified bacterial agents as the cause of diseases classified elsewhere: Secondary | ICD-10-CM | POA: Diagnosis present

## 2020-03-04 DIAGNOSIS — G4733 Obstructive sleep apnea (adult) (pediatric): Secondary | ICD-10-CM | POA: Diagnosis present

## 2020-03-04 DIAGNOSIS — Z952 Presence of prosthetic heart valve: Secondary | ICD-10-CM | POA: Diagnosis not present

## 2020-03-04 DIAGNOSIS — Z79890 Hormone replacement therapy (postmenopausal): Secondary | ICD-10-CM | POA: Diagnosis not present

## 2020-03-04 DIAGNOSIS — Z8249 Family history of ischemic heart disease and other diseases of the circulatory system: Secondary | ICD-10-CM | POA: Diagnosis not present

## 2020-03-04 DIAGNOSIS — I130 Hypertensive heart and chronic kidney disease with heart failure and stage 1 through stage 4 chronic kidney disease, or unspecified chronic kidney disease: Secondary | ICD-10-CM | POA: Diagnosis present

## 2020-03-04 DIAGNOSIS — I2510 Atherosclerotic heart disease of native coronary artery without angina pectoris: Secondary | ICD-10-CM | POA: Diagnosis present

## 2020-03-04 DIAGNOSIS — G8929 Other chronic pain: Secondary | ICD-10-CM | POA: Diagnosis present

## 2020-03-04 DIAGNOSIS — N184 Chronic kidney disease, stage 4 (severe): Secondary | ICD-10-CM | POA: Diagnosis present

## 2020-03-04 DIAGNOSIS — I5042 Chronic combined systolic (congestive) and diastolic (congestive) heart failure: Secondary | ICD-10-CM | POA: Diagnosis present

## 2020-03-04 DIAGNOSIS — Z8679 Personal history of other diseases of the circulatory system: Secondary | ICD-10-CM | POA: Diagnosis not present

## 2020-03-04 DIAGNOSIS — E785 Hyperlipidemia, unspecified: Secondary | ICD-10-CM | POA: Diagnosis present

## 2020-03-04 DIAGNOSIS — E039 Hypothyroidism, unspecified: Secondary | ICD-10-CM | POA: Diagnosis present

## 2020-03-04 DIAGNOSIS — I480 Paroxysmal atrial fibrillation: Secondary | ICD-10-CM | POA: Diagnosis present

## 2020-03-04 DIAGNOSIS — Z7901 Long term (current) use of anticoagulants: Secondary | ICD-10-CM | POA: Diagnosis not present

## 2020-03-04 DIAGNOSIS — K219 Gastro-esophageal reflux disease without esophagitis: Secondary | ICD-10-CM | POA: Diagnosis present

## 2020-03-04 DIAGNOSIS — Z20822 Contact with and (suspected) exposure to covid-19: Secondary | ICD-10-CM | POA: Diagnosis present

## 2020-03-04 DIAGNOSIS — Z8551 Personal history of malignant neoplasm of bladder: Secondary | ICD-10-CM | POA: Diagnosis not present

## 2020-03-04 LAB — RESPIRATORY PANEL BY RT PCR (FLU A&B, COVID)
Influenza A by PCR: NEGATIVE
Influenza B by PCR: NEGATIVE
SARS Coronavirus 2 by RT PCR: NEGATIVE

## 2020-03-04 LAB — SEDIMENTATION RATE: Sed Rate: 26 mm/hr — ABNORMAL HIGH (ref 0–16)

## 2020-03-04 LAB — C-REACTIVE PROTEIN: CRP: 0.6 mg/dL (ref <1.0)

## 2020-03-04 LAB — PROCALCITONIN: Procalcitonin: <0.1 ng/mL

## 2020-03-04 MED ORDER — PANTOPRAZOLE SODIUM 40 MG PO TBEC
40.0000 mg | DELAYED_RELEASE_TABLET | Freq: Two times a day (BID) | ORAL | Status: DC
  Administered 2020-03-04 – 2020-03-05 (×3): 40 mg via ORAL
  Filled 2020-03-04 (×5): qty 1

## 2020-03-04 MED ORDER — NITROGLYCERIN 0.4 MG SL SUBL: 0.4000 mg | SUBLINGUAL_TABLET | SUBLINGUAL | Status: DC | PRN

## 2020-03-04 MED ORDER — ACETAMINOPHEN 650 MG RE SUPP: 650.0000 mg | Freq: Four times a day (QID) | RECTAL | Status: DC | PRN

## 2020-03-04 MED ORDER — VITAMIN B-12 1000 MCG PO TABS
1000.0000 ug | ORAL_TABLET | Freq: Every day | ORAL | Status: DC
  Administered 2020-03-04 – 2020-03-06 (×3): 1000 ug via ORAL
  Filled 2020-03-04 (×3): qty 1

## 2020-03-04 MED ORDER — VANCOMYCIN HCL 750 MG/150ML IV SOLN
750.0000 mg | INTRAVENOUS | Status: DC
  Administered 2020-03-05 – 2020-03-06 (×2): 750 mg via INTRAVENOUS
  Filled 2020-03-04 (×2): qty 150

## 2020-03-04 MED ORDER — ACETAMINOPHEN 325 MG PO TABS: 650.0000 mg | ORAL_TABLET | Freq: Four times a day (QID) | ORAL | Status: DC | PRN

## 2020-03-04 MED ORDER — VITAMIN D 25 MCG (1000 UNIT) PO TABS
1000.0000 [IU] | ORAL_TABLET | ORAL | Status: DC
  Administered 2020-03-05: 1000 [IU] via ORAL
  Filled 2020-03-04: qty 1

## 2020-03-04 MED ORDER — LORAZEPAM 0.5 MG PO TABS: 0.5000 mg | ORAL_TABLET | Freq: Two times a day (BID) | ORAL | Status: DC | PRN

## 2020-03-04 MED ORDER — ATORVASTATIN CALCIUM 40 MG PO TABS
40.0000 mg | ORAL_TABLET | Freq: Every day | ORAL | Status: DC
  Administered 2020-03-04 – 2020-03-05 (×2): 40 mg via ORAL
  Filled 2020-03-04 (×3): qty 1

## 2020-03-04 MED ORDER — HYDRALAZINE HCL 20 MG/ML IJ SOLN: 10.0000 mg | Freq: Four times a day (QID) | INTRAMUSCULAR | Status: DC | PRN

## 2020-03-04 MED ORDER — APIXABAN 2.5 MG PO TABS
2.5000 mg | ORAL_TABLET | Freq: Two times a day (BID) | ORAL | Status: DC
  Administered 2020-03-04 – 2020-03-06 (×5): 2.5 mg via ORAL
  Filled 2020-03-04 (×5): qty 1

## 2020-03-04 MED ORDER — POLYETHYLENE GLYCOL 3350 17 G PO PACK
17.0000 g | PACK | Freq: Every day | ORAL | Status: DC
  Filled 2020-03-04 (×3): qty 1

## 2020-03-04 MED ORDER — LATANOPROST 0.005 % OP SOLN
1.0000 [drp] | Freq: Every day | OPHTHALMIC | Status: DC
  Administered 2020-03-04 – 2020-03-05 (×2): 1 [drp] via OPHTHALMIC
  Filled 2020-03-04 (×2): qty 2.5

## 2020-03-04 MED ORDER — VANCOMYCIN HCL IN DEXTROSE 1-5 GM/200ML-% IV SOLN
1000.0000 mg | Freq: Once | INTRAVENOUS | Status: AC
  Administered 2020-03-04: 1000 mg via INTRAVENOUS
  Filled 2020-03-04: qty 200

## 2020-03-04 MED ORDER — LEVOTHYROXINE SODIUM 25 MCG PO TABS
25.0000 ug | ORAL_TABLET | Freq: Every day | ORAL | Status: DC
  Administered 2020-03-04 – 2020-03-06 (×3): 25 ug via ORAL
  Filled 2020-03-04 (×3): qty 1

## 2020-03-04 MED ORDER — METOPROLOL SUCCINATE ER 100 MG PO TB24
100.0000 mg | ORAL_TABLET | Freq: Every day | ORAL | Status: DC
  Administered 2020-03-04 – 2020-03-06 (×3): 100 mg via ORAL
  Filled 2020-03-04 (×3): qty 1

## 2020-03-04 MED ORDER — ISOSORBIDE MONONITRATE ER 60 MG PO TB24
60.0000 mg | ORAL_TABLET | Freq: Every day | ORAL | Status: DC
  Administered 2020-03-04 – 2020-03-06 (×3): 60 mg via ORAL
  Filled 2020-03-04: qty 2
  Filled 2020-03-04 (×2): qty 1

## 2020-03-04 NOTE — H&P (Signed)
History and Physical    Ryan [name]:[id number] DOB: 13-Oct-1929 DOA: 03/03/2020  PCP: Dorothyann Peng, NP Patient coming from: Home  Chief Complaint: Positive blood cultures  HPI: Ryan [name] is a 86 y.o. male with medical history significant of aortic stenosis status post TAVR (bioprosthetic valve) in 2018, history of recurrent Enterococcus faecalis bacteremia and prior bioprosthetic valve endocarditis and history of discitis, chronic combined systolic and diastolic CHF, paroxysmal atrial fibrillation, sick sinus syndrome status post PPM, CAD status post PCI, PVD, carotid artery disease status post bilateral CEA, CKD stage IV, chronic low back pain, asthma, GERD, hypertension, hyperlipidemia, OSA intolerant to CPAP.  Presented to ED today after blood cultures done by PCP on 10/1 growing gram-positive cocci (aerobic bottle only), speciation pending.  Patient son-in-law present at bedside is a physician.  He states blood cultures were drawn by the patient's PCP as he has had escalating lower back pain for the past few days and has a prior history of Enterococcus bacteremia and discitis.  He is chronically on amoxicillin for the past 2 years.  Patient denies fevers or chest pain.  Reports having intermittent shortness of breath due to history of asthma, no shortness of breath at present.  Reports chronic abdominal pain and constipation.  Denies nausea, vomiting, or diarrhea.  His last bowel movement was yesterday.  Denies dysuria.  Reports having chronic urinary frequency/urgency.  ED Course: No fever or signs of sepsis.  WBC 5.4, hemoglobin 14.6, hematocrit 46.1, platelet 147K.  Sodium 139, potassium 4.5, chloride 105, bicarb 24, BUN 40, creatinine 2.1 (at baseline), glucose 127.  SARS-CoV-2 PCR test pending.  ESR and CRP pending.  Blood culture x2 pending.  CT lumbar spine without contrast pending.  Review of Systems:  All systems reviewed and apart from history of presenting illness, are  negative.  Past Medical History:  Diagnosis Date  . Age-related macular degeneration   . Allergic rhinitis   . 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 Anemia 12/2017   normocytic.  iron deficient, ferritin 17 04/2018  . Arthritis    "hands, lower back" (04/30/2016)  . Asthma   . Bacterial endocarditis   . Carotid artery obstruction    a. s/p bilat CEA.  . Chronic combined systolic (congestive) and diastolic (congestive) heart failure ([place]) 03/18/2016  . Chronic lower back pain   . CKD (chronic kidney disease) stage 4, GFR 15-29 ml/min (HCC) 12/2017  . Claudication in peripheral vascular disease ([place]) 01/01/2018   Claudication  . Coronary arteriosclerosis in native artery    a. 2017 Cardiac catheterization demonstrated worsening CAD with 70% mid LCx, 80% OM1 and 80% mid RCA stenosis. b.  In 11/17, he underwent successful rotational atherectomy and DES to RCA.  Marland Kitchen Decreased diffusion capacity 10/23/2017  . Diverticulitis of colon   . DJD (degenerative joint disease)   . Elevated TSH 06/30/2017  . GERD (gastroesophageal reflux disease)   . History of hiatal hernia   . HLD (hyperlipidemia)   . Hypertension   . Hypertensive heart disease with heart failure ([place])   . Intractable back pain 11/07/2017  . OSA (obstructive sleep apnea)    intolerant to CPAP  . PAF (paroxysmal atrial fibrillation) ([place])   . Paroxysmal supraventricular tachycardia ([place])   . Presence of permanent cardiac pacemaker   . Primary malignant neoplasm of bladder ([place])   . Prostate cancer ([place])   . Prosthetic valve endocarditis ([place]) 11/10/2017   enterococcal bacteremia  . S/P AVR 12/02/2017  . Syncope  10/23/2017  . Tachy-brady syndrome ([place])   . Thrombocytopenia ([place]) 11/07/2017  . Urinary retention 11/07/2017    Past Surgical History:  Procedure Laterality Date  . APPENDECTOMY    . CARDIAC CATHETERIZATION N/A 01/17/2016   Procedure: Right/Left Heart Cath and Coronary Angiography;  Surgeon: Belva Crome, MD;   Location: [place] [facility];  Service: Cardiovascular;  Laterality: N/A;  . CARDIAC CATHETERIZATION N/A 04/30/2016   Procedure: Coronary/Graft Atherectomy;  Surgeon: Sherren Mocha, MD;  Location: [place] [facility];  Service: Cardiovascular;  Laterality: N/A;  . CAROTID ENDARTERECTOMY Bilateral   . CATARACT EXTRACTION W/ INTRAOCULAR LENS  IMPLANT, BILATERAL Bilateral   . COLON SURGERY  1981   Meckles Diverticulum with volvulus  . COLONOSCOPY WITH PROPOFOL N/A 05/01/2018   Procedure: COLONOSCOPY WITH PROPOFOL;  Surgeon: Rush Landmark Telford Nab., MD;  Location: [place];  Service: Gastroenterology;  Laterality: N/A;  . CORONARY ANGIOGRAPHY N/A 01/18/2018   Procedure: CORONARY ANGIOGRAPHY;  Surgeon: Lorretta Harp, MD;  Location: [place] [facility];  Service: Cardiovascular;  Laterality: N/A;  . ENTEROSCOPY N/A 04/30/2018   Procedure: ENTEROSCOPY;  Surgeon: Rush Landmark Telford Nab., MD;  Location: [place];  Service: Gastroenterology;  Laterality: N/A;  . EP IMPLANTABLE DEVICE N/A 03/21/2016   Procedure: Pacemaker Implant;  Surgeon: Evans Lance, MD;  Location: [place] [facility];  Service: Cardiovascular;  Laterality: N/A;  . EYE SURGERY    . INGUINAL HERNIA REPAIR Right 2009  . INSERT / REPLACE / REMOVE PACEMAKER    . INSERTION PROSTATE RADIATION SEED    . POLYPECTOMY  05/01/2018   Procedure: POLYPECTOMY;  Surgeon: Mansouraty, Telford Nab., MD;  Location: [place];  Service: Gastroenterology;;  . TEE WITHOUT CARDIOVERSION N/A 06/10/2016   Procedure: TRANSESOPHAGEAL ECHOCARDIOGRAM (TEE);  Surgeon: Sherren Mocha, MD;  Location: [place];  Service: Open Heart Surgery;  Laterality: N/A;  . TEE WITHOUT CARDIOVERSION N/A 11/10/2017   Procedure: TRANSESOPHAGEAL ECHOCARDIOGRAM (TEE);  Surgeon: Acie Fredrickson Wonda Cheng, MD;  Location: [facility];  Service: Cardiovascular;  Laterality: N/A;  . TEE WITHOUT CARDIOVERSION N/A 12/15/2017   Procedure: TRANSESOPHAGEAL ECHOCARDIOGRAM (TEE);  Surgeon:  Buford Dresser, MD;  Location: [facility];  Service: Cardiovascular;  Laterality: N/A;  . TEE WITHOUT CARDIOVERSION N/A 03/30/2018   Procedure: TRANSESOPHAGEAL ECHOCARDIOGRAM (TEE);  Surgeon: Jerline Pain, MD;  Location: [facility] ENDOSCOPY;  Service: Cardiovascular;  Laterality: N/A;  . TONSILLECTOMY  ~ 1947  . TRANSCATHETER AORTIC VALVE REPLACEMENT, TRANSFEMORAL N/A 06/10/2016   Procedure: TRANSCATHETER AORTIC VALVE REPLACEMENT, TRANSFEMORAL;  Surgeon: Sherren Mocha, MD;  Location: [place];  Service: Open Heart Surgery;  Laterality: N/A;     reports that he has never smoked. He has never used smokeless tobacco. He reports that he does not drink alcohol and does not use drugs.  Allergies  Allergen Reactions  . Ciprofloxacin Rash  . Hydrochlorothiazide Rash  . Sulfa Antibiotics Anaphylaxis  . Ace Inhibitors Cough  . Losartan Cough  . Carvedilol Cough    Pt states it "causes him too cough."  . Lisinopril Cough    Pt reports worsening cough and "feeling wheezy."  . Soy Allergy Nausea And Vomiting    Family History  Problem Relation Age of Onset  . Heart disease Mother   . Heart disease Father     Prior to Admission medications   Medication Sig Start Date End Date Taking? Authorizing Provider  albuterol (VENTOLIN HFA) 108 (90 Base) MCG/ACT inhaler INHALE 2 PUFFS INTO THE LUNGS EVERY 6 HOURS AS NEEDED FOR  WHEEZING OR SHORTNESS OF BREATH 02/13/20   Dorothy Spark, MD  amLODipine (NORVASC) 5 MG tablet TAKE 1 TABLET(5 MG) BY MOUTH DAILY 05/10/19   Dorothy Spark, MD  amoxicillin (AMOXIL) 500 MG tablet TAKE 1 TABLET(500 MG) BY MOUTH TWICE DAILY 11/28/19   Comer, Okey Regal, MD  atorvastatin (LIPITOR) 40 MG tablet TAKE 1 TABLET(40 MG) BY MOUTH DAILY AT 6 PM 10/11/19   Dorothy Spark, MD  cholecalciferol (VITAMIN D) 1000 units tablet Take 1,000 Units by mouth 2 (two) times a week.     [provider]  ELIQUIS 2.5 MG TABS tablet TAKE 1 TABLET(2.5 MG) BY MOUTH TWICE DAILY  12/14/19   Dorothy Spark, MD  furosemide (LASIX) 20 MG tablet Take 1 tablet (20 mg total) by mouth daily as needed for fluid or edema. 12/14/18   Dorothy Spark, MD  isosorbide mononitrate (IMDUR) 60 MG 24 hr tablet Take 1 tablet (60 mg total) by mouth daily. 12/02/19   Dorothy Spark, MD  latanoprost (XALATAN) 0.005 % ophthalmic solution Place 1 drop into both eyes at bedtime.    [provider]  levothyroxine (SYNTHROID) 25 MCG tablet TAKE 1/2 TABLET BY MOUTH EVERY DAY BEFORE BREAKFAST 11/28/19   Dorothy Spark, MD  metoprolol succinate (TOPROL-XL) 100 MG 24 hr tablet TAKE 1 TABLET BY MOUTH DAILY, TAKE WITH OR IMMEDIATELY FOLLOWING A MEAL 09/13/19   Dorothy Spark, MD  Netarsudil Dimesylate (RHOPRESSA) 0.02 % SOLN Place 1 drop into both eyes at bedtime.    [provider]  nitroGLYCERIN (NITROSTAT) 0.4 MG SL tablet Place 1 tablet (0.4 mg total) under the tongue every 5 (five) minutes x 3 doses as needed for chest pain. 07/20/19   Dorothy Spark, MD  pantoprazole (PROTONIX) 40 MG tablet Take 40 mg by mouth as needed.    [provider]  polyethylene glycol (MIRALAX / GLYCOLAX) 17 g packet Take 17 g by mouth daily as needed.    [provider]  UNABLE TO FIND Take 1 tablet by mouth daily. Megafood blood builder vit c, folate, b12, iron and beet root.    [provider]  vitamin B-12 (CYANOCOBALAMIN) 1000 MCG tablet Take 1,000 mcg by mouth daily.    [provider]    Physical Exam: Vitals:   03/03/20 2006 03/03/20 2028 03/03/20 2307 03/04/20 0110  BP: (!) 156/84  (!) 177/79 (!) 141/78  Pulse: 71  65 60  Resp: _0 Temp: 97.8 F (36.6 C)  (!) 97.3 F (36.3 C) (!) 97.5 F (36.4 C)  TempSrc:   Oral   SpO2: 93%  98% 98%  Weight:  61.2 kg    Height:  _1  (1.702 m)      Physical Exam Constitutional:      General: He is not in acute distress.    Appearance: He is not ill-appearing.  HENT:     Head:  Normocephalic and atraumatic.     Mouth/Throat:     Mouth: Mucous membranes are moist.     Pharynx: Oropharynx is clear.  Eyes:     Extraocular Movements: Extraocular movements intact.     Conjunctiva/sclera: Conjunctivae normal.  Cardiovascular:     Rate and Rhythm: Normal rate and regular rhythm.     Pulses: Normal pulses.  Pulmonary:     Effort: Pulmonary effort is normal. No respiratory distress.     Breath sounds: Normal breath sounds. No wheezing or rales.  Abdominal:  General: Bowel sounds are normal.     Palpations: Abdomen is soft.     Tenderness: There is no abdominal tenderness. There is no guarding.  Musculoskeletal:        General: Tenderness present. No swelling.     Cervical back: Normal range of motion.     Comments: Lumbar spine tender to palpation  Skin:    General: Skin is warm and dry.  Neurological:     General: No focal deficit present.     Mental Status: He is alert and oriented to person, place, and time.     Labs on Admission: I have personally reviewed following labs and imaging studies  CBC: Recent Labs  Lab 03/02/20 1546 03/03/20 2036  WBC 5.5 5.4  NEUTROABS 3,174  --   HGB 14.1 14.6  HCT 42.6 46.1  MCV 94.0 95.4  PLT 156 147*   Basic Metabolic Panel: Recent Labs  Lab 03/03/20 2036  NA 139  K 4.5  CL 105  CO2 24  GLUCOSE 127*  BUN 40*  CREATININE 2.13*  CALCIUM 9.5   GFR: Estimated Creatinine Clearance: 21.2 mL/min (A) (by C-G formula based on SCr of 2.13 mg/dL (H)). Liver Function Tests: No results for input(s): AST, ALT, ALKPHOS, BILITOT, PROT, ALBUMIN in the last 168 hours. No results for input(s): LIPASE, AMYLASE in the last 168 hours. No results for input(s): AMMONIA in the last 168 hours. Coagulation Profile: No results for input(s): INR, PROTIME in the last 168 hours. Cardiac Enzymes: No results for input(s): CKTOTAL, CKMB, CKMBINDEX, TROPONINI in the last 168 hours. BNP (last 3 results) Recent Labs     07/26/19 0756 12/28/19 0815  PROBNP 419 326   HbA1C: No results for input(s): HGBA1C in the last 72 hours. CBG: No results for input(s): GLUCAP in the last 168 hours. Lipid Profile: No results for input(s): CHOL, HDL, LDLCALC, TRIG, CHOLHDL, LDLDIRECT in the last 72 hours. Thyroid Function Tests: No results for input(s): TSH, T4TOTAL, FREET4, T3FREE, THYROIDAB in the last 72 hours. Anemia Panel: No results for input(s): VITAMINB12, FOLATE, FERRITIN, TIBC, IRON, RETICCTPCT in the last 72 hours. Urine analysis:    Component Value Date/Time   COLORURINE YELLOW 03/28/2018 1821   APPEARANCEUR CLEAR 03/28/2018 1821   LABSPEC 1.013 03/28/2018 1821   PHURINE 5.0 03/28/2018 1821   GLUCOSEU NEGATIVE 03/28/2018 1821   HGBUR NEGATIVE 03/28/2018 1821   BILIRUBINUR NEGATIVE 03/28/2018 1821   KETONESUR NEGATIVE 03/28/2018 1821   PROTEINUR NEGATIVE 03/28/2018 1821   NITRITE NEGATIVE 03/28/2018 1821   LEUKOCYTESUR NEGATIVE 03/28/2018 1821    Radiological Exams on Admission: CT Lumbar Spine Wo Contrast  Result Date: 03/04/2020 CLINICAL DATA:  Low back pain for 1 week. Positive blood culture. No fever or leukocytosis. History of endocarditis. EXAM: CT LUMBAR SPINE WITHOUT CONTRAST TECHNIQUE: Multidetector CT imaging of the lumbar spine was performed without intravenous contrast administration. Multiplanar CT image reconstructions were also generated. COMPARISON:  11/07/2017 FINDINGS: Segmentation: 5 lumbar type vertebrae. Alignment: Moderate lower lumbar levoscoliosis. Unchanged grade 1 retrolisthesis of L1 on L2, L2 on L3, and L3 on L4. Unchanged 1.6 cm anterolisthesis of L5 on S1 secondary to L5 pars defects. Vertebrae: No acute fracture or suspicious osseous lesion. No interval disc or endplate destruction to indicate discitis-osteomyelitis. Similar appearance of sclerotic degenerative endplate changes at W2-9, L2-3, and L5-S1 associated with severe disc space narrowing and vacuum disc at these  levels. Mild posterior disc space narrowing at L3-4 and L4-5. Paraspinal and other soft tissues: No  evidence of significant paraspinal soft tissue inflammation or fluid collection. Prominent bladder distension. Abdominal aortic atherosclerosis without aneurysm. Unchanged 9 mm hypodensity in the liver, likely a cyst. Small sliding hiatal hernia. Disc levels: T10-11: Schmorl's node.  No stenosis. T11-12: Schmorl's node.  No stenosis. T12-L1: Mild disc bulging without stenosis, unchanged. L1-2: Retrolisthesis with a circumferential disc osteophyte complex asymmetric to the left results in moderate left neural foraminal stenosis without significant spinal stenosis, unchanged. L2-3: Retrolisthesis with a circumferential disc osteophyte complex and right greater than left facet and ligamentum flavum hypertrophy result in asymmetric right lateral recess stenosis and moderate right and mild left neural foraminal stenosis without significant spinal stenosis, unchanged. L3-4: Retrolisthesis with bulging uncovered disc and facet and ligamentum flavum hypertrophy result in right greater than left lateral recess stenosis and mild bilateral neural foraminal stenosis without significant spinal stenosis, unchanged. L4-5: Right eccentric disc bulging and facet hypertrophy result in mild right neural foraminal stenosis without significant spinal stenosis, unchanged. L5-S1: Anterolisthesis with disc uncovering and severe disc space height loss result in severe bilateral neural foraminal stenosis and mild spinal stenosis, unchanged. IMPRESSION: 1. No evidence of infection or other acute osseous abnormality in the lumbar spine. 2. Unchanged lumbar disc and facet degeneration. 3. 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 4. Aortic Atherosclerosis (ICD10-I70.0). Electronically Signed   By: Logan Bores M.D.   On: 03/04/2020 04:59    Assessment/Plan Principal  Problem:   Bacteremia Active Problems:   Chronic combined systolic (congestive) and diastolic (congestive) heart failure (HCC)   Hypertension   HLD (hyperlipidemia)   Hypothyroidism   Concern for bacteremia: Patient has had increasing back pain for the past few days.  Blood cultures done by PCP on 10/1 growing gram-positive cocci (aerobic bottle only).  Although this could be a contaminant, he does have a history of recurrent Enterococcus faecalis bacteremia and prior bioprosthetic valve endocarditis and history of discitis.  Also has a pacemaker which could be a potential source of infection.  He is chronically on amoxicillin. -Start vancomycin, pending speciation and sensitivities on current culture.  Repeat blood cultures have been drawn.  Please consult ID in a.m. Will likely need TEE to rule out endocarditis.  ESR and CRP pending.  CT lumbar spine pending.  Chronic combined systolic and diastolic CHF: Appears euvolemic on exam at present. -Resume home meds after pharmacy med rec is done  Paroxysmal atrial fibrillation: Stable. -Resume Eliquis after pharmacy med rec is done  CKD stage IV: Creatinine currently 2.1 and appears to be at baseline. -Continue to monitor renal function.  Hypertension: Systolic currently in the 140s. -Resume home meds after pharmacy med rec is done  Hyperlipidemia -Resume statin after pharmacy med rec is done  Hypothyroidism -Resume Synthroid after pharmacy med rec is done  Pharmacy med rec pending.  DVT prophylaxis: On chronic anticoagulation with Eliquis, resume after pharmacy med rec is done. Code Status: Patient wishes to be full code. Family Communication: Son-in-law at bedside. Disposition Plan: Status is: Inpatient  Remains inpatient appropriate because:IV treatments appropriate due to intensity of illness or inability to take PO   Dispo: The patient is from: Home              Anticipated d/c is to: Home              Anticipated d/c date  is: 3 days              Patient  currently is not medically stable to d/c.  The medical decision making on this patient was of high complexity and the patient is at high risk for clinical deterioration, therefore this is a level 3 visit.  Shela Leff MD [facility]  If 7PM-7AM, please contact night-coverage [email or web address]  03/04/2020, 5:12 AM

## 2020-03-04 NOTE — Evaluation (Signed)
Physical Therapy Evaluation and discharge Patient Details Name: Ryan [name] MRN: [id number] DOB: August 17, 1929 Today's Date: 03/04/2020   History of Present Illness  Pt is an 86 y/o male admitted secondary to increased back pain with concern for bacteremia. Workup pending. PMH includes OSA on CPAP, CKD, HTN, CHF, CAD s/p stent, s/p pacemaker, a fib and s/p TAVR.   Clinical Impression  Patient evaluated by Physical Therapy with no further acute PT needs identified. All education has been completed and the patient has no further questions. Pt overall steady with gait this session. Able to perform dynamic head turns and step over obstacles without LOB. Pt normally very active and exercises daily. Required supervision for safety throughout. Per pt, feels he is close to baseline. No further skilled acute PT needs. See below for any follow-up Physical Therapy or equipment needs. PT is signing off. Thank you for this referral. If needs change, please re-consult.     Follow Up Recommendations No PT follow up    Equipment Recommendations  None recommended by PT    Recommendations for Other Services       Precautions / Restrictions Precautions Precautions: Back Precaution Booklet Issued: No Precaution Comments: Verbally educated about back precautions to help with pain management.  Restrictions Weight Bearing Restrictions: No      Mobility  Bed Mobility Overal bed mobility: Needs Assistance Bed Mobility: Rolling;Sidelying to Sit;Sit to Supine Rolling: Supervision Sidelying to sit: Supervision   Sit to supine: Supervision   General bed mobility comments: Supervision for safety. Educated about use of log roll to help with back pain.   Transfers Overall transfer level: Needs assistance Equipment used: Straight cane Transfers: Sit to/from Stand Sit to Stand: Supervision         General transfer comment: supervision for safety. No LOB noted.   Ambulation/Gait Ambulation/Gait  assistance: Supervision Gait Distance (Feet): 125 Feet Assistive device: Straight cane Gait Pattern/deviations: Step-through pattern;Decreased stride length Gait velocity: Decreased   General Gait Details: Pt reports R knee pain at baseline. Slightly antalgic gait. Able to perform dynamic head turns and step over obstacles without LOB. Pt reports he feels close to baseline.   Stairs            Wheelchair Mobility    Modified Rankin (Stroke Patients Only)       Balance Overall balance assessment: No apparent balance deficits (not formally assessed)                                           Pertinent Vitals/Pain Pain Assessment: 0-10 Pain Score: 6  Pain Location: back Pain Descriptors / Indicators: Grimacing;Guarding Pain Intervention(s): Monitored during session;Limited activity within patient's tolerance;Repositioned    Home Living Family/patient expects to be discharged to:: Private residence Living Arrangements: Spouse/significant other Available Help at Discharge: Family;Available 24 hours/day Type of Home: House Home Access: Stairs to enter Entrance Stairs-Rails: Right Entrance Stairs-Number of Steps: 3 Home Layout: Two level;Able to live on main level with bedroom/bathroom Home Equipment: Shower seat;Walker - 2 wheels;Cane - single point      Prior Function Level of Independence: Independent with assistive device(s)         Comments: Is very active. Exercises on recumbent bike daily and walks daily. Uses cane for ambulation      Hand Dominance        Extremity/Trunk Assessment   Upper Extremity Assessment  Upper Extremity Assessment: Overall WFL for tasks assessed    Lower Extremity Assessment Lower Extremity Assessment: RLE deficits/detail RLE Deficits / Details: Reports R knee pain at baseline.     Cervical / Trunk Assessment Cervical / Trunk Assessment: Other exceptions Cervical / Trunk Exceptions: chronic back pain    Communication   Communication: No difficulties  Cognition Arousal/Alertness: Awake/alert Behavior During Therapy: WFL for tasks assessed/performed Overall Cognitive Status: Within Functional Limits for tasks assessed                                        General Comments General comments (skin integrity, edema, etc.): Pt's son present during session. Educated about walking program to perform at home.     Exercises     Assessment/Plan    PT Assessment Patent does not need any further PT services  PT Problem List         PT Treatment Interventions      PT Goals (Current goals can be found in the Care Plan section)  Acute Rehab PT Goals Patient Stated Goal: to go home PT Goal Formulation: With patient Time For Goal Achievement: 03/04/20 Potential to Achieve Goals: Good    Frequency     Barriers to discharge        Co-evaluation               AM-PAC PT "6 Clicks" Mobility  Outcome Measure Help needed turning from your back to your side while in a flat bed without using bedrails?: None Help needed moving from lying on your back to sitting on the side of a flat bed without using bedrails?: None Help needed moving to and from a bed to a chair (including a wheelchair)?: None Help needed standing up from a chair using your arms (e.g., wheelchair or bedside chair)?: None Help needed to walk in hospital room?: None Help needed climbing 3-5 steps with a railing? : A Little 6 Click Score: 23    End of Session Equipment Utilized During Treatment: Gait belt Activity Tolerance: Patient tolerated treatment well Patient left: in bed;with call bell/phone within reach;with family/visitor present (on stretcher in ED ) Nurse Communication: Mobility status PT Visit Diagnosis: Other abnormalities of gait and mobility (R26.89);Pain Pain - part of body:  (back)    Time: 1000-1015 PT Time Calculation (min) (ACUTE ONLY): 15 min   Charges:   PT Evaluation $PT  Eval Low Complexity: 1 Low          Lou Miner, DPT  Acute Rehabilitation Services  Pager: [phone] Office: [phone]   Rudean Hitt 03/04/2020, 12:30 PM

## 2020-03-04 NOTE — Progress Notes (Signed)
Pharmacy Antibiotic Note  Ryan [name] is a 86 y.o. male admitted on 03/03/2020 with bacteremia.  Pharmacy has been consulted for vancomycin dosing.  Of note pt has h/o recurrent enterococcal endocarditis, currently on amoxicillin suppression; sees ID service in f/u and due for OV this month.  Blood cx at PCP growing gram-positive cocci in clusters.  Plan: Vancomycin 1000mg  x1 then 750mg  IV every 24 hours.  Goal trough 15-20 mcg/mL.  Height: 5\' 7"  (170.2 cm) Weight: 61.2 kg (135 lb) IBW/kg (Calculated) : 66.1  Temp (24hrs), Avg:97.5 F (36.4 C), Min:97.3 F (36.3 C), Max:97.8 F (36.6 C)  Recent Labs  Lab 03/02/20 1546 03/03/20 2036  WBC 5.5 5.4  CREATININE  --  2.13*    Estimated Creatinine Clearance: 21.2 mL/min (A) (by C-G formula based on SCr of 2.13 mg/dL (H)).    Allergies  Allergen Reactions  . Ciprofloxacin Rash  . Hydrochlorothiazide Rash  . Sulfa Antibiotics Anaphylaxis  . Ace Inhibitors Cough  . Losartan Cough  . Carvedilol Cough    Pt states it "causes him too cough."  . Lisinopril Cough    Pt reports worsening cough and "feeling wheezy."  . Soy Allergy Nausea And Vomiting     Thank you for allowing pharmacy to be a part of this patient's care.  Wynona Neat, PharmD, BCPS  03/04/2020 5:03 AM

## 2020-03-04 NOTE — ED Notes (Signed)
Patient resting on stretcher with HOB elevated. Vitals stable. Appears in NAD. Denies needs.

## 2020-03-04 NOTE — ED Notes (Signed)
Patient ambulated to and from restroom independently with steady gait. Denies further needs at this time.

## 2020-03-04 NOTE — Plan of Care (Signed)
  Problem: Education: Goal: Knowledge of General Education information will improve Description: Including pain rating scale, medication(s)/side effects and non-pharmacologic comfort measures Outcome: Completed/Met

## 2020-03-04 NOTE — ED Notes (Signed)
Ryan [name] - Son in [place], [phone], call with updates/if pt gets inpatient bed assignment

## 2020-03-04 NOTE — Progress Notes (Signed)
ANTICOAGULATION CONSULT NOTE - Initial Consult  Pharmacy Consult for eliquis Indication: atrial fibrillation  Allergies  Allergen Reactions  . Ciprofloxacin Rash  . Hydrochlorothiazide Rash  . Sulfa Antibiotics Anaphylaxis  . Ace Inhibitors Cough  . Losartan Cough  . Carvedilol Cough    Pt states it "causes him too cough."  . Lisinopril Cough    Pt reports worsening cough and "feeling wheezy."  . Soy Allergy Nausea And Vomiting    Patient Measurements: Height: 5\' 7"  (170.2 cm) Weight: 61.2 kg (135 lb) IBW/kg (Calculated) : 66.1  Vital Signs: Temp: 97.5 F (36.4 C) (10/03 0110) Temp Source: Oral (10/02 2307) BP: 180/94 (10/03 0630) Pulse Rate: 59 (10/03 0630)  Labs: Recent Labs    03/02/20 1546 03/03/20 2036  HGB 14.1 14.6  HCT 42.6 46.1  PLT 156 147*  CREATININE  --  2.13*    Estimated Creatinine Clearance: 21.2 mL/min (A) (by C-G formula based on SCr of 2.13 mg/dL (H)).   Medical History: Past Medical History:  Diagnosis Date  . Age-related macular degeneration   . Allergic rhinitis   . 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 Anemia 12/2017   normocytic.  iron deficient, ferritin 17 04/2018  . Arthritis    "hands, lower back" (04/30/2016)  . Asthma   . Bacterial endocarditis   . Carotid artery obstruction    a. s/p bilat CEA.  . Chronic combined systolic (congestive) and diastolic (congestive) heart failure ([place]) 03/18/2016  . Chronic lower back pain   . CKD (chronic kidney disease) stage 4, GFR 15-29 ml/min (HCC) 12/2017  . Claudication in peripheral vascular disease ([place]) 01/01/2018   Claudication  . Coronary arteriosclerosis in native artery    a. 2017 Cardiac catheterization demonstrated worsening CAD with 70% mid LCx, 80% OM1 and 80% mid RCA stenosis. b.  In 11/17, he underwent successful rotational atherectomy and DES to RCA.  Marland Kitchen Decreased diffusion capacity 10/23/2017  . Diverticulitis of colon   . DJD (degenerative joint disease)   .  Elevated TSH 06/30/2017  . GERD (gastroesophageal reflux disease)   . History of hiatal hernia   . HLD (hyperlipidemia)   . Hypertension   . Hypertensive heart disease with heart failure ([place])   . Intractable back pain 11/07/2017  . OSA (obstructive sleep apnea)    intolerant to CPAP  . PAF (paroxysmal atrial fibrillation) ([place])   . Paroxysmal supraventricular tachycardia ([place])   . Presence of permanent cardiac pacemaker   . Primary malignant neoplasm of bladder ([place])   . Prostate cancer ([place])   . 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 S/P AVR 12/02/2017  . Syncope 10/23/2017  . Tachy-brady syndrome ([place])   . Thrombocytopenia ([place]) 11/07/2017  . Urinary retention 11/07/2017    Assessment: 86 yo man to continue eliquis therapy for afib Goal of Therapy:  Therapeutic anticoagulation Monitor platelets by anticoagulation protocol: Yes   Plan:  Eliquis 2.5 mg po bid Monitor for bleeding complications  Freeland Pracht [place] 03/04/2020,6:59 AM

## 2020-03-04 NOTE — ED Notes (Signed)
Tele  Breakfast Ordered 

## 2020-03-04 NOTE — ED Provider Notes (Signed)
[facility] EMERGENCY DEPARTMENT Provider Note   CSN: [id number] Arrival date & time: 03/03/20  1953     History Chief Complaint  Patient presents with  . Back Pain    Ryan [name] is a 86 y.o. male.  HPI 86 year old male presents with recurrent low back pain and positive blood culture.  History is primarily obtained from the patient's son-in-law at the bedside.  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  Cannot get MRI because of his pacemaker.  Last occurrence was a couple years ago.  Over the last few days he has had atraumatic lumbar back pain without fever or radicular symptoms.  Went to his PCP who obtained labs and blood cultures.  One of the 2 blood cultures has started growing gram-positive cocci so he was referred to the ER.   Past Medical History:  Diagnosis Date  . Age-related macular degeneration   . Allergic rhinitis   . 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 Anemia 12/2017   normocytic.  iron deficient, ferritin 17 04/2018  . Arthritis    "hands, lower back" (04/30/2016)  . Asthma   . Bacterial endocarditis   . Carotid artery obstruction    a. s/p bilat CEA.  . Chronic combined systolic (congestive) and diastolic (congestive) heart failure ([place]) 03/18/2016  . Chronic lower back pain   . CKD (chronic kidney disease) stage 4, GFR 15-29 ml/min (HCC) 12/2017  . Claudication in peripheral vascular disease ([place]) 01/01/2018   Claudication  . Coronary arteriosclerosis in native artery    a. 2017 Cardiac catheterization demonstrated worsening CAD with 70% mid LCx, 80% OM1 and 80% mid RCA stenosis. b.  In 11/17, he underwent successful rotational atherectomy and DES to RCA.  Marland Kitchen Decreased diffusion capacity 10/23/2017  . Diverticulitis of colon   . DJD (degenerative joint disease)   . Elevated TSH 06/30/2017  . GERD (gastroesophageal reflux disease)   . History of hiatal hernia   . HLD  (hyperlipidemia)   . Hypertension   . Hypertensive heart disease with heart failure ([place])   . Intractable back pain 11/07/2017  . OSA (obstructive sleep apnea)    intolerant to CPAP  . PAF (paroxysmal atrial fibrillation) ([place])   . Paroxysmal supraventricular tachycardia ([place])   . Presence of permanent cardiac pacemaker   . Primary malignant neoplasm of bladder ([place])   . Prostate cancer ([place])   . 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 S/P AVR 12/02/2017  . Syncope 10/23/2017  . Tachy-brady syndrome ([place])   . Thrombocytopenia ([place]) 11/07/2017  . Urinary retention 11/07/2017    Patient Active Problem List   Diagnosis Date Noted  . Bacteremia 03/04/2020  . GIB (gastrointestinal bleeding) 04/28/2018  . HLD (hyperlipidemia) 04/28/2018  . Asthma 04/28/2018  . Hypothyroidism 04/28/2018  . Chronic diastolic CHF (congestive heart failure) ([place]) 04/28/2018  . Symptomatic anemia 04/28/2018  . Acute renal failure superimposed on stage 4 chronic kidney disease ([place]) 04/28/2018  . Medication monitoring encounter 04/22/2018  . Presence of permanent cardiac pacemaker 03/29/2018  . Bacteremia due to Enterococcus 03/29/2018  . Hypertensive urgency   . Unstable angina ([place]) 01/15/2018  . Claudication in peripheral vascular disease ([place]) 01/01/2018  . S/P AVR 12/02/2017  . 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 Intractable back pain 11/07/2017  . Elevated troponin 11/07/2017  . Urinary retention 11/07/2017  . Hypertension   . Syncope 10/23/2017  . Decreased  diffusion capacity 10/23/2017  . 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 Elevated TSH 06/30/2017  . Anticoagulation management encounter 08/19/2016  . CAD (coronary artery disease), native coronary artery 04/30/2016  . Tachy-brady syndrome ([place])   . Hypertensive heart disease with heart failure ([place])   . Pure hypercholesterolemia   . PAF (paroxysmal atrial fibrillation) ([place]) 03/18/2016  .  Chronic combined systolic (congestive) and diastolic (congestive) heart failure ([place]) 03/18/2016  . Atrial fibrillation with rapid ventricular response ([place]) 03/18/2016  . DOE (dyspnea on exertion) 01/17/2016  . Coronary artery disease of native artery of native heart with stable angina pectoris [facility])     Past Surgical History:  Procedure Laterality Date  . APPENDECTOMY    . CARDIAC CATHETERIZATION N/A 01/17/2016   Procedure: Right/Left Heart Cath and Coronary Angiography;  Surgeon: Belva Crome, MD;  Location: [facility] CV LAB;  Service: Cardiovascular;  Laterality: N/A;  . CARDIAC CATHETERIZATION N/A 04/30/2016   Procedure: Coronary/Graft Atherectomy;  Surgeon: Sherren Mocha, MD;  Location: [facility] CV LAB;  Service: Cardiovascular;  Laterality: N/A;  . CAROTID ENDARTERECTOMY Bilateral   . CATARACT EXTRACTION W/ INTRAOCULAR LENS  IMPLANT, BILATERAL Bilateral   . COLON SURGERY  1981   Meckles Diverticulum with volvulus  . COLONOSCOPY WITH PROPOFOL N/A 05/01/2018   Procedure: COLONOSCOPY WITH PROPOFOL;  Surgeon: Rush Landmark Telford Nab., MD;  Location: [facility];  Service: Gastroenterology;  Laterality: N/A;  . CORONARY ANGIOGRAPHY N/A 01/18/2018   Procedure: CORONARY ANGIOGRAPHY;  Surgeon: Lorretta Harp, MD;  Location: [facility] CV LAB;  Service: Cardiovascular;  Laterality: N/A;  . ENTEROSCOPY N/A 04/30/2018   Procedure: ENTEROSCOPY;  Surgeon: Rush Landmark Telford Nab., MD;  Location: [facility];  Service: Gastroenterology;  Laterality: N/A;  . EP IMPLANTABLE DEVICE N/A 03/21/2016   Procedure: Pacemaker Implant;  Surgeon: Evans Lance, MD;  Location: [facility] CV LAB;  Service: Cardiovascular;  Laterality: N/A;  . EYE SURGERY    . INGUINAL HERNIA REPAIR Right 2009  . INSERT / REPLACE / REMOVE PACEMAKER    . INSERTION PROSTATE RADIATION SEED    . POLYPECTOMY  05/01/2018   Procedure: POLYPECTOMY;  Surgeon: Mansouraty, Telford Nab., MD;  Location: [facility];  Service:  Gastroenterology;;  . TEE WITHOUT CARDIOVERSION N/A 06/10/2016   Procedure: TRANSESOPHAGEAL ECHOCARDIOGRAM (TEE);  Surgeon: Sherren Mocha, MD;  Location: [facility];  Service: Open Heart Surgery;  Laterality: N/A;  . TEE WITHOUT CARDIOVERSION N/A 11/10/2017   Procedure: TRANSESOPHAGEAL ECHOCARDIOGRAM (TEE);  Surgeon: Acie Fredrickson Wonda Cheng, MD;  Location[facility];  Service: Cardiovascular;  Laterality: N/A;  . TEE WITHOUT CARDIOVERSION N/A 12/15/2017   Procedure: TRANSESOPHAGEAL ECHOCARDIOGRAM (TEE);  Surgeon: Buford Dresser, MD;  [facility]COPY;  Service: Cardiovascular;  Laterality: N/A;  . TEE WITHOUT CARDIOVERSION N/A 03/30/2018   Procedure: TRANSESOPHAGEAL ECHOCARDIOGRAM (TEE);  Surgeon: Jerline Pain, MD;  Location: [facility] ENDOSCOPY;  Service: Cardiovascular;  Laterality: N/A;  . TONSILLECTOMY  ~ 1947  . TRANSCATHETER AORTIC VALVE REPLACEMENT, TRANSFEMORAL N/A 06/10/2016   Procedure: TRANSCATHETER AORTIC VALVE REPLACEMENT, TRANSFEMORAL;  Surgeon: Sherren Mocha, MD;  Location: [facility];  Service: Open Heart Surgery;  Laterality: N/A;       Family History  Problem Relation Age of Onset  . Heart disease Mother   . Heart disease Father     Social History   Tobacco Use  . Smoking status: Never Smoker  . Smokeless tobacco: Never Used  Vaping Use  . Vaping Use: Never used  Substance Use Topics  . Alcohol use:  No    Alcohol/week: 0.0 standard drinks    Comment: 04/30/2016 "nothing in years"  . Drug use: No    Home Medications Prior to Admission medications   Medication Sig Start Date End Date Taking? Authorizing Provider  albuterol (VENTOLIN HFA) 108 (90 Base) MCG/ACT inhaler INHALE 2 PUFFS INTO THE LUNGS EVERY 6 HOURS AS NEEDED FOR WHEEZING OR SHORTNESS OF BREATH 02/13/20   Dorothy Spark, MD  amLODipine (NORVASC) 5 MG tablet TAKE 1 TABLET(5 MG) BY MOUTH DAILY 05/10/19   Dorothy Spark, MD  amoxicillin (AMOXIL) 500 MG tablet TAKE 1 TABLET(500 MG) BY MOUTH TWICE DAILY  11/28/19   Comer, Okey Regal, MD  atorvastatin (LIPITOR) 40 MG tablet TAKE 1 TABLET(40 MG) BY MOUTH DAILY AT 6 PM 10/11/19   Dorothy Spark, MD  cholecalciferol (VITAMIN D) 1000 units tablet Take 1,000 Units by mouth 2 (two) times a week.     [provider]  ELIQUIS 2.5 MG TABS tablet TAKE 1 TABLET(2.5 MG) BY MOUTH TWICE DAILY 12/14/19   Dorothy Spark, MD  furosemide (LASIX) 20 MG tablet Take 1 tablet (20 mg total) by mouth daily as needed for fluid or edema. 12/14/18   Dorothy Spark, MD  isosorbide mononitrate (IMDUR) 60 MG 24 hr tablet Take 1 tablet (60 mg total) by mouth daily. 12/02/19   Dorothy Spark, MD  latanoprost (XALATAN) 0.005 % ophthalmic solution Place 1 drop into both eyes at bedtime.    [provider]  levothyroxine (SYNTHROID) 25 MCG tablet TAKE 1/2 TABLET BY MOUTH EVERY DAY BEFORE BREAKFAST 11/28/19   Dorothy Spark, MD  metoprolol succinate (TOPROL-XL) 100 MG 24 hr tablet TAKE 1 TABLET BY MOUTH DAILY, TAKE WITH OR IMMEDIATELY FOLLOWING A MEAL 09/13/19   Dorothy Spark, MD  Netarsudil Dimesylate (RHOPRESSA) 0.02 % SOLN Place 1 drop into both eyes at bedtime.    [provider]  nitroGLYCERIN (NITROSTAT) 0.4 MG SL tablet Place 1 tablet (0.4 mg total) under the tongue every 5 (five) minutes x 3 doses as needed for chest pain. 07/20/19   Dorothy Spark, MD  pantoprazole (PROTONIX) 40 MG tablet Take 40 mg by mouth as needed.    [provider]  polyethylene glycol (MIRALAX / GLYCOLAX) 17 g packet Take 17 g by mouth daily as needed.    [provider]  UNABLE TO FIND Take 1 tablet by mouth daily. Megafood blood builder vit c, folate, b12, iron and beet root.    [provider]  vitamin B-12 (CYANOCOBALAMIN) 1000 MCG tablet Take 1,000 mcg by mouth daily.    [provider]    Allergies    Ciprofloxacin, Hydrochlorothiazide, Sulfa antibiotics, Ace inhibitors, Losartan, Carvedilol, Lisinopril, and Soy  allergy  Review of Systems   Review of Systems  Constitutional: Negative for fever.  Musculoskeletal: Positive for back pain.  Neurological: Negative for weakness and numbness.  All other systems reviewed and are negative.   Physical Exam Updated Vital Signs BP (!) 141/78   Pulse 60   Temp (!) 97.5 F (36.4 C)   Resp 18   Ht 5\' 7"  (1.702 m)   Wt 61.2 kg   SpO2 98%   BMI 21.14 kg/m   Physical Exam Vitals and nursing note reviewed.  Constitutional:      Appearance: He is well-developed.  HENT:     Head: Normocephalic and atraumatic.     Right Ear: External ear normal.     Left Ear:  External ear normal.     Nose: Nose normal.  Eyes:     General:        Right eye: No discharge.        Left eye: No discharge.  Cardiovascular:     Rate and Rhythm: Normal rate and regular rhythm.     Heart sounds: Murmur heard.   Pulmonary:     Effort: Pulmonary effort is normal.     Breath sounds: Normal breath sounds.  Abdominal:     Palpations: Abdomen is soft.     Tenderness: There is no abdominal tenderness.  Musculoskeletal:     Cervical back: Neck supple.     Lumbar back: Tenderness (mild) present.  Skin:    General: Skin is warm and dry.  Neurological:     Mental Status: He is alert.     Comments: 5/5 strength in BLE. Normal gross sensation  Psychiatric:        Mood and Affect: Mood is not anxious.     ED Results / Procedures / Treatments   Labs (all labs ordered are listed, but only abnormal results are displayed) Labs Reviewed  CBC - Abnormal; Notable for the following components:      Result Value   Platelets 147 (*)    All other components within normal limits  BASIC METABOLIC PANEL - Abnormal; Notable for the following components:   Glucose, Bld 127 (*)    BUN 40 (*)    Creatinine, Ser 2.13 (*)    GFR calc non Af Amer 27 (*)    GFR calc Af Amer 31 (*)    All other components within normal limits  CULTURE, BLOOD (ROUTINE X 2)  CULTURE, BLOOD (ROUTINE X  2)  RESPIRATORY PANEL BY RT PCR (FLU A&B, COVID)  SEDIMENTATION RATE  C-REACTIVE PROTEIN    EKG None  Radiology No results found.  Procedures Procedures (including critical care time)  Medications Ordered in ED Medications  vancomycin (VANCOCIN) IVPB 1000 mg/200 mL premix (has no administration in time range)    ED Course  I have reviewed the triage vital signs and the nursing notes.  Pertinent labs & imaging results that were available during my care of the patient were reviewed by me and considered in my medical decision making (see chart for details).    MDM Rules/Calculators/A&P                          Patient is not ill-appearing or septic.  His initial blood work is unremarkable save for chronic kidney disease.  However, with the positive blood culture in his prior history, I think he will need work-up for endocarditis and admission.  Unfortunately he cannot get MRI so we will get CT lumbar spine which will have to be without contrast because of his chronic kidney disease.  I have discussed admission with Dr. Marlowe Sax. Final Clinical Impression(s) / ED Diagnoses Final diagnoses:  Positive blood culture    Rx / DC Orders ED Discharge Orders    None       Sherwood Gambler, MD 03/04/20 [phone]

## 2020-03-04 NOTE — Consult Note (Signed)
[facility]  Total days of antibiotics -chronic amox       Reason for Consult:gram positive bacteremia    Referring Physician: singh  Principal Problem:   Bacteremia Active Problems:   Chronic combined systolic (congestive) and diastolic (congestive) heart failure (HCC)   Hypertension   HLD (hyperlipidemia)   Hypothyroidism    HPI: Ryan [name] is a 86 y.o. 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 Has had hx of intermittent worsening low back pain due to chronic lumbar spondylosis. Hover, on day prior to admit, he reports having acute worsening back pain. Denies fever, chills, nightsweats. He was seen by PCP who drew blood cx and 1 of 4 bottles showing GPCs thus patient routed to the ED for evaluation. He remains afebrile, wbc is WNL, sed rate is 29 and CRP of 0.6, procalcitonin negative. Imaging of lumbar spine shows:Chronic L5 pars defects with grade 2 anterolisthesis and unchanged severe bilateral neural foraminal stenosis. He was started on vancomycin in the ED.  Past Medical History:  Diagnosis Date  . Age-related macular degeneration   . Allergic rhinitis   . 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 Anemia 12/2017   normocytic.  iron deficient, ferritin 17 04/2018  . Arthritis    "hands, lower back" (04/30/2016)  . Asthma   . Bacterial endocarditis   . Carotid artery obstruction    a. s/p bilat CEA.  . Chronic combined systolic (congestive) and diastolic (congestive) heart failure ([place]) 03/18/2016  . Chronic lower back pain   . CKD (chronic kidney disease) stage 4, GFR 15-29 ml/min (HCC) 12/2017  . Claudication in  peripheral vascular disease ([place]) 01/01/2018   Claudication  . Coronary arteriosclerosis in native artery    a. 2017 Cardiac catheterization demonstrated worsening CAD with 70% mid LCx, 80% OM1 and 80% mid RCA stenosis. b.  In 11/17, he underwent successful rotational atherectomy and DES to RCA.  Marland Kitchen Decreased diffusion capacity 10/23/2017  . Diverticulitis of colon   . DJD (degenerative joint disease)   . Elevated TSH 06/30/2017  . GERD (gastroesophageal reflux disease)   . History of hiatal hernia   . HLD (hyperlipidemia)   . Hypertension   . Hypertensive heart disease with heart failure ([place])   . Intractable back pain 11/07/2017  . OSA (obstructive sleep apnea)    intolerant to CPAP  . PAF (paroxysmal atrial fibrillation) ([place])   . Paroxysmal supraventricular tachycardia ([place])   . Presence of permanent cardiac pacemaker   . Primary malignant neoplasm of bladder ([place])   . Prostate cancer ([place])   . Prosthetic valve endocarditis ([place]) 11/10/2017   enterococcal bacteremia  . S/P AVR 12/02/2017  . Syncope 10/23/2017  . Tachy-brady syndrome ([place])   . Thrombocytopenia ([place]) 11/07/2017  . Urinary retention 11/07/2017    Allergies:  Allergies  Allergen Reactions  . Ciprofloxacin Rash  . Hydrochlorothiazide Rash  . Sulfa Antibiotics Anaphylaxis  . Ace Inhibitors Cough  . Losartan Cough  . Carvedilol Cough    Pt states it "causes him too cough."  . Lisinopril Cough    Pt reports worsening cough and "feeling wheezy."  . Soy Allergy Nausea And Vomiting  MEDICATIONS: . apixaban  2.5 mg Oral BID  . atorvastatin  40 mg Oral Daily  . [START ON 03/05/2020] cholecalciferol  1,000 Units Oral Once per day on Mon Thu  . isosorbide mononitrate  60 mg Oral Daily  . latanoprost  1 drop Both Eyes QHS  . levothyroxine  25 mcg Oral Q0600  . metoprolol succinate  100 mg Oral Daily  . pantoprazole  40 mg Oral BID  . polyethylene glycol  17 g Oral Daily  . vitamin B-12  1,000 mcg Oral Daily    Social  History   Tobacco Use  . Smoking status: Never Smoker  . Smokeless tobacco: Never Used  Vaping Use  . Vaping Use: Never used  Substance Use Topics  . Alcohol use: No    Alcohol/week: 0.0 standard drinks    Comment: 04/30/2016 "nothing in years"  . Drug use: No    Family History  Problem Relation Age of Onset  . Heart disease Mother   . Heart disease Father     Review of Systems  Constitutional: Negative for fever, chills, diaphoresis, activity change, appetite change, fatigue and unexpected weight change.  HENT: Negative for congestion, sore throat, rhinorrhea, sneezing, trouble swallowing and sinus pressure.  Eyes: Negative for photophobia and visual disturbance.  Respiratory: Negative for cough, chest tightness, shortness of breath, wheezing and stridor.  Cardiovascular: Negative for chest pain, palpitations and leg swelling.  Gastrointestinal: Negative for nausea, vomiting, abdominal pain, diarrhea, constipation, blood in stool, abdominal distention and anal bleeding.  Genitourinary: Negative for dysuria, hematuria, flank pain and difficulty urinating.  Musculoskeletal: +back pain. Negative for myalgias,  joint swelling, arthralgias and gait problem.  Skin: Negative for color change, pallor, rash and wound.  Neurological: Negative for dizziness, tremors, weakness and light-headedness.  Hematological: Negative for adenopathy. Does not bruise/bleed easily.  Psychiatric/Behavioral: Negative for behavioral problems, confusion, sleep disturbance, dysphoric mood, decreased concentration and agitation.     OBJECTIVE: Temp:  [97.3 F (36.3 C)-98.1 F (36.7 C)] 98.1 F (36.7 C) (10/03 1217) Pulse Rate:  [59-71] 62 (10/03 1217) Resp:  [13-19] 19 (10/03 1154) BP: (141-197)/(73-120) 152/73 (10/03 1217) SpO2:  [93 %-99 %] 98 % (10/03 1217) Weight:  [61.2 kg] 61.2 kg (10/02 2028) Physical Exam  Constitutional: He is oriented to person, place, and time. He appears well-developed  and well-nourished. No distress.  HENT:  Mouth/Throat: Oropharynx is clear and moist. No oropharyngeal exudate.  Cardiovascular: Normal rate, regular rhythm and normal heart sounds. Exam reveals no gallop and no friction rub. +murmur Pulmonary/Chest: Effort normal and breath sounds normal. No respiratory distress. He has no wheezes.  Abdominal: Soft. Bowel sounds are normal. He exhibits no distension. There is no tenderness.  Lymphadenopathy:  He has no cervical adenopathy.  Neurological: He is alert and oriented to person, place, and time.  Skin: Skin is warm and dry. No rash noted. No erythema.  Psychiatric: He has a normal mood and affect. His behavior is normal.    LABS: Results for orders placed or performed during the hospital encounter of 03/03/20 (from the past 48 hour(s))  CBC     Status: Abnormal   Collection Time: 03/03/20  8:36 PM  Result Value Ref Range   WBC 5.4 4.0 - 10.5 K/uL   RBC 4.83 4.22 - 5.81 MIL/uL   Hemoglobin 14.6 13.0 - 17.0 g/dL   HCT 46.1 39 - 52 %   MCV 95.4 80.0 - 100.0 fL   MCH 30.2 26.0 - 34.0 pg  MCHC 31.7 30.0 - 36.0 g/dL   RDW 12.9 11.5 - 15.5 %   Platelets 147 (L) 150 - 400 K/uL   nRBC 0.0 0.0 - 0.2 %    Comment: Performed at [facility], [place] [address], [place], [place] [address]  Basic metabolic panel     Status: Abnormal   Collection Time: 03/03/20  8:36 PM  Result Value Ref Range   Sodium 139 135 - 145 mmol/L   Potassium 4.5 3.5 - 5.1 mmol/L   Chloride 105 98 - 111 mmol/L   CO2 24 22 - 32 mmol/L   Glucose, Bld 127 (H) 70 - 99 mg/dL    Comment: Glucose reference range applies only to samples taken after fasting for at least 8 hours.   BUN 40 (H) 8 - 23 mg/dL   Creatinine, Ser 2.13 (H) 0.61 - 1.24 mg/dL   Calcium 9.5 8.9 - 10.3 mg/dL   GFR calc non Af Amer 27 (L) >60 mL/min   GFR calc Af Amer 31 (L) >60 mL/min   Anion gap 10 5 - 15    Comment: Performed at [facility] [address] [place], [place] [address]    Culture, blood (routine x 2)     Status: None (Preliminary result)   Collection Time: 03/04/20  3:35 AM   Specimen: BLOOD  Result Value Ref Range   Specimen Description BLOOD LEFT ANTECUBITAL    Special Requests      BOTTLES DRAWN AEROBIC AND ANAEROBIC Blood Culture results may not be optimal due to an inadequate volume of blood received in culture bottles   Culture      NO GROWTH < 12 HOURS Performed at [facility] [address] [place], [place] [address]    Report Status PENDING   Sedimentation rate     Status: Abnormal   Collection Time: 03/04/20  3:36 AM  Result Value Ref Range   Sed Rate 26 (H) 0 - 16 mm/hr    Comment: Performed at [facility] [address] [place], [place] [address]  C-reactive protein     Status: None   Collection Time: 03/04/20  3:36 AM  Result Value Ref Range   CRP 0.6 <1.0 mg/dL    Comment: Performed at [facility] [address], [place], [place] [address]  Procalcitonin - Baseline     Status: None   Collection Time: 03/04/20  3:36 AM  Result Value Ref Range   Procalcitonin <0.10 ng/mL    Comment:        Interpretation: PCT (Procalcitonin) <= 0.5 ng/mL: Systemic infection (sepsis) is not likely. Local bacterial infection is possible. (NOTE)       Sepsis PCT Algorithm           Lower Respiratory Tract                                      Infection PCT Algorithm    ----------------------------     ----------------------------         PCT < 0.25 ng/mL                PCT < 0.10 ng/mL          Strongly encourage             Strongly discourage   discontinuation of antibiotics    initiation of antibiotics    ----------------------------     -----------------------------  PCT 0.25 - 0.50 ng/mL            PCT 0.10 - 0.25 ng/mL               OR       >80% decrease in PCT            Discourage initiation of                                            antibiotics      Encourage discontinuation           of antibiotics     ----------------------------     -----------------------------         PCT >= 0.50 ng/mL              PCT 0.26 - 0.50 ng/mL               AND        <80% decrease in PCT             Encourage initiation of                                             antibiotics       Encourage continuation           of antibiotics    ----------------------------     -----------------------------        PCT >= 0.50 ng/mL                  PCT > 0.50 ng/mL               AND         increase in PCT                  Strongly encourage                                      initiation of antibiotics    Strongly encourage escalation           of antibiotics                                     -----------------------------                                           PCT <= 0.25 ng/mL                                                 OR                                        > 80% decrease in PCT  Discontinue / Do not initiate                                             antibiotics  Performed at [facility], [place] [address], [place], [place] [address]   Culture, blood (routine x 2)     Status: None (Preliminary result)   Collection Time: 03/04/20  3:38 AM   Specimen: BLOOD RIGHT FOREARM  Result Value Ref Range   Specimen Description BLOOD RIGHT FOREARM    Special Requests      BOTTLES DRAWN AEROBIC AND ANAEROBIC Blood Culture adequate volume   Culture      NO GROWTH < 12 HOURS Performed at [facility], [place] [address] [place], [place] [address]    Report Status PENDING   Respiratory Panel by RT PCR (Flu A&B, Covid) - Nasopharyngeal Swab     Status: None   Collection Time: 03/04/20  5:50 AM   Specimen: Nasopharyngeal Swab  Result Value Ref Range   SARS Coronavirus 2 by RT PCR NEGATIVE NEGATIVE    Comment: (NOTE) SARS-CoV-2 target nucleic acids are NOT DETECTED.  The SARS-CoV-2 RNA is generally detectable in upper respiratoy specimens during the acute  phase of infection. The lowest concentration of SARS-CoV-2 viral copies this assay can detect is 131 copies/mL. A negative result does not preclude SARS-Cov-2 infection and should not be used as the sole basis for treatment or other patient management decisions. A negative result may occur with  improper specimen collection/handling, submission of specimen other than nasopharyngeal swab, presence of viral mutation(s) within the areas targeted by this assay, and inadequate number of viral copies (<131 copies/mL). A negative result must be combined with clinical observations, patient history, and epidemiological information. The expected result is Negative.  Fact Sheet for Patients:  PinkCheek.be  Fact Sheet for Healthcare Providers:  GravelBags.it  This test is no t yet approved or cleared by the [place] FDA and  has been authorized for detection and/or diagnosis of SARS-CoV-2 by FDA under an Emergency Use Authorization (EUA). This EUA will remain  in effect (meaning this test can be used) for the duration of the COVID-19 declaration under Section 564(b)(1) of the Act, 21 U.S.C. section 360bbb-3(b)(1), unless the authorization is terminated or revoked sooner.     Influenza A by PCR NEGATIVE NEGATIVE   Influenza B by PCR NEGATIVE NEGATIVE    Comment: (NOTE) The Xpert Xpress SARS-CoV-2/FLU/RSV assay is intended as an aid in  the diagnosis of influenza from Nasopharyngeal swab specimens and  should not be used as a sole basis for treatment. Nasal washings and  aspirates are unacceptable for Xpert Xpress SARS-CoV-2/FLU/RSV  testing.  Fact Sheet for Patients: PinkCheek.be  Fact Sheet for Healthcare Providers: GravelBags.it  This test is not yet approved or cleared by the [place] FDA and  has been authorized for detection and/or diagnosis of SARS-CoV-2 by    FDA under an Emergency Use Authorization (EUA). This EUA will remain  in effect (meaning this test can be used) for the duration of the  Covid-19 declaration under Section 564(b)(1) of the Act, 21  U.S.C. section 360bbb-3(b)(1), unless the authorization is  terminated or revoked. Performed at [facility], [place] [address], [place], [place] [address]     MICRO:  IMAGING: CT Lumbar Spine Wo Contrast  Result Date: 03/04/2020 CLINICAL  DATA:  Low back pain for 1 week. Positive blood culture. No fever or leukocytosis. History of endocarditis. EXAM: CT LUMBAR SPINE WITHOUT CONTRAST TECHNIQUE: Multidetector CT imaging of the lumbar spine was performed without intravenous contrast administration. Multiplanar CT image reconstructions were also generated. COMPARISON:  11/07/2017 FINDINGS: Segmentation: 5 lumbar type vertebrae. Alignment: Moderate lower lumbar levoscoliosis. Unchanged grade 1 retrolisthesis of L1 on L2, L2 on L3, and L3 on L4. Unchanged 1.6 cm anterolisthesis of L5 on S1 secondary to L5 pars defects. Vertebrae: No acute fracture or suspicious osseous lesion. No interval disc or endplate destruction to indicate discitis-osteomyelitis. Similar appearance of sclerotic degenerative endplate changes at Q6-5, L2-3, and L5-S1 associated with severe disc space narrowing and vacuum disc at these levels. Mild posterior disc space narrowing at L3-4 and L4-5. Paraspinal and other soft tissues: No evidence of significant paraspinal soft tissue inflammation or fluid collection. Prominent bladder distension. Abdominal aortic atherosclerosis without aneurysm. Unchanged 9 mm hypodensity in the liver, likely a cyst. Small sliding hiatal hernia. Disc levels: T10-11: Schmorl's node.  No stenosis. T11-12: Schmorl's node.  No stenosis. T12-L1: Mild disc bulging without stenosis, unchanged. L1-2: Retrolisthesis with a circumferential disc osteophyte complex asymmetric to the left results in moderate left neural  foraminal stenosis without significant spinal stenosis, unchanged. L2-3: Retrolisthesis with a circumferential disc osteophyte complex and right greater than left facet and ligamentum flavum hypertrophy result in asymmetric right lateral recess stenosis and moderate right and mild left neural foraminal stenosis without significant spinal stenosis, unchanged. L3-4: Retrolisthesis with bulging uncovered disc and facet and ligamentum flavum hypertrophy result in right greater than left lateral recess stenosis and mild bilateral neural foraminal stenosis without significant spinal stenosis, unchanged. L4-5: Right eccentric disc bulging and facet hypertrophy result in mild right neural foraminal stenosis without significant spinal stenosis, unchanged. L5-S1: Anterolisthesis with disc uncovering and severe disc space height loss result in severe bilateral neural foraminal stenosis and mild spinal stenosis, unchanged. IMPRESSION: 1. No evidence of infection or other acute osseous abnormality in the lumbar spine. 2. Unchanged lumbar disc and facet degeneration. 3. 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 4. Aortic Atherosclerosis (ICD10-I70.0). Electronically Signed   By: Logan Bores M.D.   On: 03/04/2020 04:59     Assessment/Plan:  86yo M with hx of enterococcal PVE treated in 2019 x 2 on chronic suppression with amoxicillin with hx of lumbar spine/disc degeneration who is admitted for worsening back pain with possible bacteremia ( 1 of 4 bottles with GPC) - taking into consideration imaging and biomarkers, it does not appear that he has new infection, however this would not explain his possible bacteremia. - agree with plan to keep on vancomycin until we here back from [facility] labs on identification of GPC. They did not have identification when I called the lab today. This possibly maybe a contaminant given only 1 of 4 bottles. If true bacteremia -  then would need to consider re-evaluation for cardiac device/prosthetic vavle endocarditis/discitis.  - dr comer to see tomorrow.

## 2020-03-04 NOTE — Progress Notes (Signed)
PROGRESS NOTE                                                                                                                                                                                                             Patient Demographics:    Ryan [name], is a 86 y.o. male, DOB - [birth year], EHM:[id number]  Outpatient Primary MD for the patient is Dorothyann Peng, NP    LOS - 0  Admit date - 03/03/2020    Chief Complaint  Patient presents with  . Back Pain       Brief Narrative  Ryan [name] is a 86 y.o. male with medical history significant of aortic stenosis status post TAVR (bioprosthetic valve) in 2018, history of recurrent Enterococcus faecalis bacteremia and prior bioprosthetic valve endocarditis and history of discitis, chronic combined systolic and diastolic CHF, paroxysmal atrial fibrillation, sick sinus syndrome status post PPM, CAD status post PCI, PVD, carotid artery disease status post bilateral CEA, CKD stage IV, chronic low back pain, asthma, GERD, hypertension, hyperlipidemia, OSA intolerant to CPAP.   He has chronic low back pain but it got slightly worse, saw his PCP where due to his past history blood cultures were drawn, 1 out of 2 cultures were positive for gram-positive cocci and he was sent to the ER for further work-up of possible bacteremia.   Subjective:    Ryan [name] today has, No headache, No chest pain, No abdominal pain - No Nausea, No new weakness tingling or numbness, no shortness of breath, chronic low back pain.   Assessment  & Plan :     1. 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  His CT scan of L-spine although noncontrast appears satisfactory, procalcitonin and CRP are also reassuring.  He has no leukocytosis or fever and appears nontoxic.  He is currently on IV antibiotics and his oral amoxicillin is on hold.  Blood cultures drawn this admission are still pending however I would favor towards contamination as being the source of positive blood culture in PCP office.  Will monitor another 24 hours and discussed with ID.  Advance activity.  PT OT.    SpO2: 96 %  Recent Labs  Lab 03/02/20 1546 03/03/20 2036 03/04/20 0336 03/04/20 0550  WBC 5.5 5.4  --   --   PLT 156 147*  --   --   CRP  --   --  0.6  --   PROCALCITON  --   --  <0.10  --   SARSCOV2NAA  --   --   --  NEGATIVE     2.  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  As in above.  3.  Chronic diastolic CHF EF now 26% on recent echocardiogram.  Currently appears compensated hold dose of Imdur and beta-blocker have been continued.   4.  CKD stage IV.  Baseline creatinine around 2.  Monitor.  5.  Dyslipidemia.  On home dose statin.  6.  Paroxysmal atrial fibrillation.  Mali vas 2 score of greater than 3.  On beta-blocker and Eliquis.  7.  Anxiety.  As needed Ativan.  8.  Essential hypertension.  Home medications resumed.  9.  Hypothyroidism.  On Synthroid.  10.  GERD.  On PPI.    Condition - Fair  Family Communication  : Son-in-law at bedside in detail  Code Status : Full  Consults  : Will discuss with ID.  Procedures  : CT of L-spine.  Nonacute.  PUD Prophylaxis : PPI  Disposition Plan  :    Status is: Inpatient  Remains inpatient appropriate because:IV treatments appropriate due to intensity of illness or inability to take PO   Dispo: The patient is from: Home              Anticipated d/c is to: Home              Anticipated d/c date is: > 3 days              Patient currently is not medically stable to d/c.   DVT  Prophylaxis  :  Eliquis  Lab Results  Component Value Date   PLT 147 (L) 03/03/2020    Diet :  Diet Order            Diet Heart Room service appropriate? Yes; Fluid consistency: Thin  Diet effective now                  Inpatient Medications  Scheduled Meds: . apixaban  2.5 mg Oral BID  . atorvastatin  40 mg Oral Daily  . [START ON 03/05/2020] cholecalciferol  1,000 Units Oral Once per day on Mon Thu  . isosorbide mononitrate  60 mg Oral Daily  . latanoprost  1 drop Both Eyes QHS  . levothyroxine  25 mcg Oral Q0600  . metoprolol succinate  100 mg Oral Daily  . pantoprazole  40 mg Oral BID  . polyethylene glycol  17 g Oral Daily  . vitamin B-12  1,000 mcg Oral Daily   Continuous Infusions: . [START ON 03/05/2020] vancomycin     PRN Meds:.acetaminophen **OR** acetaminophen, nitroGLYCERIN  Antibiotics  :    Anti-infectives (From admission, onward)   Start     Dose/Rate Route Frequency Ordered Stop   03/05/20 0400  vancomycin (VANCOREADY) IVPB 750 mg/150 mL        750 mg 150 mL/hr over 60 Minutes Intravenous Every 24 hours 03/04/20 0508     03/04/20 0430  vancomycin (VANCOCIN) IVPB 1000 mg/200 mL  premix        1,000 mg 200 mL/hr over 60 Minutes Intravenous  Once 03/04/20 0421 03/04/20 9326       Time Spent in minutes  30   Lala Lund M.D on 03/04/2020 at 9:22 AM  To page go to [email or web address] - password [facility]  [facility] -  Office  [phone]     See all Orders from today for further details    Objective:   Vitals:   03/04/20 0545 03/04/20 0600 03/04/20 0615 03/04/20 0630  BP: (!) 166/77 (!) 167/87 (!) 175/73 (!) 180/94  Pulse: 60 60 60 (!) 59  Resp:      Temp:      TempSrc:      SpO2: 93% 96% 95% 96%  Weight:      Height:        Wt Readings from Last 3 Encounters:  03/03/20 61.2 kg  11/17/19 60.8 kg  07/20/19 59.9 kg     Intake/Output Summary (Last 24 hours) at 03/04/2020 0922 Last data filed at 03/04/2020 3382 Gross per 24 hour   Intake 194.43 ml  Output --  Net 194.43 ml     Physical Exam  Awake Alert, No new F.N deficits, Normal affect [place].AT,PERRAL Supple Neck,No JVD, No cervical lymphadenopathy appriciated.  Symmetrical Chest wall movement, Good air movement bilaterally, CTAB RRR,No Gallops,Rubs or new Murmurs, No Parasternal Heave +ve B.Sounds, Abd Soft, No tenderness, No organomegaly appriciated, No rebound - guarding or rigidity. No Cyanosis, Clubbing or edema, mild L-spine tenderness on palpation question of this is chronic    Data Review:    CBC Recent Labs  Lab 03/02/20 1546 03/03/20 2036  WBC 5.5 5.4  HGB 14.1 14.6  HCT 42.6 46.1  PLT 156 147*  MCV 94.0 95.4  MCH 31.1 30.2  MCHC 33.1 31.7  RDW 12.4 12.9  LYMPHSABS 1,287  --   EOSABS 160  --   BASOSABS 72  --     Recent Labs  Lab 03/03/20 2036 03/04/20 0336  NA 139  --   K 4.5  --   CL 105  --   CO2 24  --   GLUCOSE 127*  --   BUN 40*  --   CREATININE 2.13*  --   CALCIUM 9.5  --   CRP  --  0.6  PROCALCITON  --  <0.10    ------------------------------------------------------------------------------------------------------------------ No results for input(s): CHOL, HDL, LDLCALC, TRIG, CHOLHDL, LDLDIRECT in the last 72 hours.  Lab Results  Component Value Date   HGBA1C 5.5 06/06/2016   ------------------------------------------------------------------------------------------------------------------ No results for input(s): TSH, T4TOTAL, T3FREE, THYROIDAB in the last 72 hours.  Invalid input(s): FREET3  Cardiac Enzymes No results for input(s): CKMB, TROPONINI, MYOGLOBIN in the last 168 hours.  Invalid input(s): CK ------------------------------------------------------------------------------------------------------------------    Component Value Date/Time   BNP 219.3 (H) 11/07/2017 5053    Micro Results Recent Results (from the past 240 hour(s))  Culture, Blood     Status: None (Preliminary result)    Collection Time: 03/02/20  3:44 PM   Specimen: Blood  Result Value Ref Range Status   MICRO NUMBER: [id number]  Preliminary   SPECIMEN QUALITY: Adequate  Preliminary   Source BLOOD  Preliminary   STATUS: PRELIMINARY  Preliminary   Result:   Preliminary    No growth to date. Culture is continuously monitored for a total of 120 hours incubation. A change in status will result in a phone report followed by an updated printed culture report.  Visual inspection of blood culture bottles indicates that an  excessive volume of blood may have been collected. Detection of sepsis may be compromised.    COMMENT: Aerobic and anaerobic bottle received.  Preliminary  Culture, Blood     Status: Abnormal (Preliminary result)   Collection Time: 03/02/20  3:44 PM   Specimen: Blood  Result Value Ref Range Status   MICRO NUMBER: [id number]  Preliminary   SPECIMEN QUALITY: Adequate  Preliminary   Source BLOOD  Preliminary   STATUS: PRELIMINARY  Preliminary   Result:   Preliminary    Visual inspection of blood culture bottles indicates that an excessive volume of blood may have been collected. Detection of sepsis may be compromised.   ISOLATE 1: Gram positive cocci from aerobic bottle only (AA)  Preliminary   COMMENT: Aerobic and anaerobic bottle received.  Preliminary  Respiratory Panel by RT PCR (Flu A&B, Covid) - Nasopharyngeal Swab     Status: None   Collection Time: 03/04/20  5:50 AM   Specimen: Nasopharyngeal Swab  Result Value Ref Range Status   SARS Coronavirus 2 by RT PCR NEGATIVE NEGATIVE Final    Comment: (NOTE) SARS-CoV-2 target nucleic acids are NOT DETECTED.  The SARS-CoV-2 RNA is generally detectable in upper respiratoy specimens during the acute phase of infection. The lowest concentration of SARS-CoV-2 viral copies this assay can detect is 131 copies/mL. A negative result does not preclude SARS-Cov-2 infection and should not be used as the sole basis for treatment or other patient  management decisions. A negative result may occur with  improper specimen collection/handling, submission of specimen other than nasopharyngeal swab, presence of viral mutation(s) within the areas targeted by this assay, and inadequate number of viral copies (<131 copies/mL). A negative result must be combined with clinical observations, patient history, and epidemiological information. The expected result is Negative.  Fact Sheet for Patients:  PinkCheek.be  Fact Sheet for Healthcare Providers:  GravelBags.it  This test is no t yet approved or cleared by the [place] FDA and  has been authorized for detection and/or diagnosis of SARS-CoV-2 by FDA under an Emergency Use Authorization (EUA). This EUA will remain  in effect (meaning this test can be used) for the duration of the COVID-19 declaration under Section 564(b)(1) of the Act, 21 U.S.C. section 360bbb-3(b)(1), unless the authorization is terminated or revoked sooner.     Influenza A by PCR NEGATIVE NEGATIVE Final   Influenza B by PCR NEGATIVE NEGATIVE Final    Comment: (NOTE) The Xpert Xpress SARS-CoV-2/FLU/RSV assay is intended as an aid in  the diagnosis of influenza from Nasopharyngeal swab specimens and  should not be used as a sole basis for treatment. Nasal washings and  aspirates are unacceptable for Xpert Xpress SARS-CoV-2/FLU/RSV  testing.  Fact Sheet for Patients: PinkCheek.be  Fact Sheet for Healthcare Providers: GravelBags.it  This test is not yet approved or cleared by the [place] FDA and  has been authorized for detection and/or diagnosis of SARS-CoV-2 by  FDA under an Emergency Use Authorization (EUA). This EUA will remain  in effect (meaning this test can be used) for the duration of the  Covid-19 declaration under Section 564(b)(1) of the Act, 21  U.S.C. section 360bbb-3(b)(1),  unless the authorization is  terminated or revoked. Performed at [facility], [place] [address] [place], [place] [address]     Radiology Reports CT Lumbar Spine Wo Contrast  Result Date: 03/04/2020 CLINICAL DATA:  Low back pain for 1 week. Positive  blood culture. No fever or leukocytosis. History of endocarditis. EXAM: CT LUMBAR SPINE WITHOUT CONTRAST TECHNIQUE: Multidetector CT imaging of the lumbar spine was performed without intravenous contrast administration. Multiplanar CT image reconstructions were also generated. COMPARISON:  11/07/2017 FINDINGS: Segmentation: 5 lumbar type vertebrae. Alignment: Moderate lower lumbar levoscoliosis. Unchanged grade 1 retrolisthesis of L1 on L2, L2 on L3, and L3 on L4. Unchanged 1.6 cm anterolisthesis of L5 on S1 secondary to L5 pars defects. Vertebrae: No acute fracture or suspicious osseous lesion. No interval disc or endplate destruction to indicate discitis-osteomyelitis. Similar appearance of sclerotic degenerative endplate changes at B9-3, L2-3, and L5-S1 associated with severe disc space narrowing and vacuum disc at these levels. Mild posterior disc space narrowing at L3-4 and L4-5. Paraspinal and other soft tissues: No evidence of significant paraspinal soft tissue inflammation or fluid collection. Prominent bladder distension. Abdominal aortic atherosclerosis without aneurysm. Unchanged 9 mm hypodensity in the liver, likely a cyst. Small sliding hiatal hernia. Disc levels: T10-11: Schmorl's node.  No stenosis. T11-12: Schmorl's node.  No stenosis. T12-L1: Mild disc bulging without stenosis, unchanged. L1-2: Retrolisthesis with a circumferential disc osteophyte complex asymmetric to the left results in moderate left neural foraminal stenosis without significant spinal stenosis, unchanged. L2-3: Retrolisthesis with a circumferential disc osteophyte complex and right greater than left facet and ligamentum flavum hypertrophy result in asymmetric right  lateral recess stenosis and moderate right and mild left neural foraminal stenosis without significant spinal stenosis, unchanged. L3-4: Retrolisthesis with bulging uncovered disc and facet and ligamentum flavum hypertrophy result in right greater than left lateral recess stenosis and mild bilateral neural foraminal stenosis without significant spinal stenosis, unchanged. L4-5: Right eccentric disc bulging and facet hypertrophy result in mild right neural foraminal stenosis without significant spinal stenosis, unchanged. L5-S1: Anterolisthesis with disc uncovering and severe disc space height loss result in severe bilateral neural foraminal stenosis and mild spinal stenosis, unchanged. IMPRESSION: 1. No evidence of infection or other acute osseous abnormality in the lumbar spine. 2. Unchanged lumbar disc and facet degeneration. 3. 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 4. Aortic Atherosclerosis (ICD10-I70.0). Electronically Signed   By: Logan Bores M.D.   On: 03/04/2020 04:59

## 2020-03-05 DIAGNOSIS — Z8679 Personal history of other diseases of the circulatory system: Secondary | ICD-10-CM

## 2020-03-05 LAB — CBC WITH DIFFERENTIAL/PLATELET
Abs Immature Granulocytes: 0.03 10*3/uL (ref 0.00–0.07)
Basophils Absolute: 0.1 10*3/uL (ref 0.0–0.1)
Basophils Relative: 1 %
Eosinophils Absolute: 0.3 10*3/uL (ref 0.0–0.5)
Eosinophils Relative: 6 %
HCT: 42 % (ref 39.0–52.0)
Hemoglobin: 13.2 g/dL (ref 13.0–17.0)
Immature Granulocytes: 1 %
Lymphocytes Relative: 18 %
Lymphs Abs: 0.8 10*3/uL (ref 0.7–4.0)
MCH: 30.5 pg (ref 26.0–34.0)
MCHC: 31.4 g/dL (ref 30.0–36.0)
MCV: 97 fL (ref 80.0–100.0)
Monocytes Absolute: 0.5 10*3/uL (ref 0.1–1.0)
Monocytes Relative: 12 %
Neutro Abs: 2.8 10*3/uL (ref 1.7–7.7)
Neutrophils Relative %: 62 %
Platelets: 150 10*3/uL (ref 150–400)
RBC: 4.33 MIL/uL (ref 4.22–5.81)
RDW: 13 % (ref 11.5–15.5)
WBC: 4.4 10*3/uL (ref 4.0–10.5)
nRBC: 0 % (ref 0.0–0.2)

## 2020-03-05 LAB — COMPREHENSIVE METABOLIC PANEL
ALT: 11 U/L (ref 0–44)
AST: 24 U/L (ref 15–41)
Albumin: 3.2 g/dL — ABNORMAL LOW (ref 3.5–5.0)
Alkaline Phosphatase: 54 U/L (ref 38–126)
Anion gap: 8 (ref 5–15)
BUN: 36 mg/dL — ABNORMAL HIGH (ref 8–23)
CO2: 24 mmol/L (ref 22–32)
Calcium: 9.1 mg/dL (ref 8.9–10.3)
Chloride: 109 mmol/L (ref 98–111)
Creatinine, Ser: 2.25 mg/dL — ABNORMAL HIGH (ref 0.61–1.24)
GFR calc Af Amer: 29 mL/min — ABNORMAL LOW (ref >60)
GFR calc non Af Amer: 25 mL/min — ABNORMAL LOW (ref >60)
Glucose, Bld: 100 mg/dL — ABNORMAL HIGH (ref 70–99)
Potassium: 3.6 mmol/L (ref 3.5–5.1)
Sodium: 141 mmol/L (ref 135–145)
Total Bilirubin: 0.7 mg/dL (ref 0.3–1.2)
Total Protein: 5.7 g/dL — ABNORMAL LOW (ref 6.5–8.1)

## 2020-03-05 LAB — C-REACTIVE PROTEIN: CRP: 0.7 mg/dL (ref <1.0)

## 2020-03-05 LAB — PHOSPHORUS: Phosphorus: 3.2 mg/dL (ref 2.5–4.6)

## 2020-03-05 LAB — PROCALCITONIN: Procalcitonin: <0.1 ng/mL

## 2020-03-05 LAB — BRAIN NATRIURETIC PEPTIDE: B Natriuretic Peptide: 51.5 pg/mL (ref 0.0–100.0)

## 2020-03-05 LAB — MAGNESIUM: Magnesium: 2.1 mg/dL (ref 1.7–2.4)

## 2020-03-05 MED ORDER — AMLODIPINE BESYLATE 5 MG PO TABS
5.0000 mg | ORAL_TABLET | Freq: Every day | ORAL | Status: DC
  Administered 2020-03-05: 5 mg via ORAL
  Filled 2020-03-05: qty 1

## 2020-03-05 NOTE — Progress Notes (Signed)
[facility] for Infectious Disease  Date of Admission:  03/03/2020   Total days of antibiotics 2        Day 2 Vanc   ASSESSMENT: 86 yo male hx tavr complicated by recurrent enterococcal faecalis endocarditis on suppressive amoxicillin admitted 10/2 for brief episode weakness/fatigue after outpatient blood cx grew GPC cluster in 1 of 4 bottles  Repeat blood cx 10/3 ngtd. Clinically back to baseline. Original blood cx pending speciation still. Remains on vanc  PLAN: 1. Anticipate speciation today; ok to keep on vancomycin 2. Agree looking at discharge tomorrow if bcx 10/1 c/w contaminant, and restart on home amoxicillin 3. Will need f/u with dr Scharlene Gloss in the ID clinic within 2-3 weeks   Principal Problem:   Bacteremia Active Problems:   Chronic combined systolic (congestive) and diastolic (congestive) heart failure (HCC)   Hypertension   HLD (hyperlipidemia)   Hypothyroidism   Scheduled Meds:  amLODipine  5 mg Oral Daily   apixaban  2.5 mg Oral BID   atorvastatin  40 mg Oral Daily   cholecalciferol  1,000 Units Oral Once per day on Mon Thu   isosorbide mononitrate  60 mg Oral Daily   latanoprost  1 drop Both Eyes QHS   levothyroxine  25 mcg Oral Q0600   metoprolol succinate  100 mg Oral Daily   pantoprazole  40 mg Oral BID   polyethylene glycol  17 g Oral Daily   vitamin B-12  1,000 mcg Oral Daily   Continuous Infusions:  vancomycin 750 mg (03/05/20 0505)   PRN Meds:.acetaminophen **OR** acetaminophen, hydrALAZINE, LORazepam, nitroGLYCERIN   SUBJECTIVE: Felt well, back to 100% baseline health Query why he is not back on his home amlodipine; complains current bp running high 150s-180s here No n/v/diarrhea, rash, f/c, sob   Review of Systems: ROS Negative 11 point ros unless mentioned above  Allergies  Allergen Reactions   Ciprofloxacin Rash   Hydrochlorothiazide Rash   Sulfa Antibiotics Anaphylaxis   Ace Inhibitors Cough     Losartan Cough   Carvedilol Cough    Pt states it "causes him too cough."   Lisinopril Cough    Pt reports worsening cough and "feeling wheezy."   Soy Allergy Nausea And Vomiting    OBJECTIVE: Vitals:   03/05/20 0029 03/05/20 0539 03/05/20 0840 03/05/20 0918  BP: (!) 156/80 (!) 160/66 (!) 152/66 (!) 152/66  Pulse: 63 61 83 83  Resp: 18 18    Temp: 98.2 F (36.8 C) 97.8 F (36.6 C)    TempSrc: Oral Oral    SpO2: 94% 93% 97%   Weight:      Height:       Body mass index is 21.14 kg/m.  Physical Exam General: no distress, pleasant, playing cards with his wife Heent: normocephalic; per, conj clear Neck supple Cv: rrr, presence of systolic murmur upper sternal border abd s/nt Ext no edema Skin no rash Neuro nonfocal Psych alert/oriented  Lab Results Lab Results  Component Value Date   WBC 4.4 03/05/2020   HGB 13.2 03/05/2020   HCT 42.0 03/05/2020   MCV 97.0 03/05/2020   PLT 150 03/05/2020    Lab Results  Component Value Date   CREATININE 2.25 (H) 03/05/2020   BUN 36 (H) 03/05/2020   NA 141 03/05/2020   K 3.6 03/05/2020   CL 109 03/05/2020   CO2 24 03/05/2020    Lab Results  Component Value Date   ALT 11  03/05/2020   AST 24 03/05/2020   ALKPHOS 54 03/05/2020   BILITOT 0.7 03/05/2020     Microbiology: Recent Results (from the past 240 hour(s))  Culture, Blood     Status: None (Preliminary result)   Collection Time: 03/02/20  3:44 PM   Specimen: Blood  Result Value Ref Range Status   MICRO NUMBER: [id number]  Preliminary   SPECIMEN QUALITY: Adequate  Preliminary   Source BLOOD  Preliminary   STATUS: PRELIMINARY  Preliminary   Result:   Preliminary    No growth to date. Culture is continuously monitored for a total of 120 hours incubation. A change in status will result in a phone report followed by an updated printed culture report. Visual inspection of blood culture bottles indicates that an  excessive volume of blood may have been collected.  Detection of sepsis may be compromised.    COMMENT: Aerobic and anaerobic bottle received.  Preliminary  Culture, Blood     Status: Abnormal (Preliminary result)   Collection Time: 03/02/20  3:44 PM   Specimen: Blood  Result Value Ref Range Status   MICRO NUMBER: [id number]  Preliminary   SPECIMEN QUALITY: Adequate  Preliminary   Source BLOOD  Preliminary   STATUS: PRELIMINARY  Preliminary   Result:   Preliminary    Visual inspection of blood culture bottles indicates that an excessive volume of blood may have been collected. Detection of sepsis may be compromised.   ISOLATE 1: Gram positive cocci from aerobic bottle only (AA)  Preliminary   COMMENT: Aerobic and anaerobic bottle received.  Preliminary  Culture, blood (routine x 2)     Status: None (Preliminary result)   Collection Time: 03/04/20  3:35 AM   Specimen: BLOOD  Result Value Ref Range Status   Specimen Description BLOOD LEFT ANTECUBITAL  Final   Special Requests   Final    BOTTLES DRAWN AEROBIC AND ANAEROBIC Blood Culture results may not be optimal due to an inadequate volume of blood received in culture bottles   Culture   Final    NO GROWTH 1 DAY Performed at [facility], [place] [address], [place], [place] [address]    Report Status PENDING  Incomplete  Culture, blood (routine x 2)     Status: None (Preliminary result)   Collection Time: 03/04/20  3:38 AM   Specimen: BLOOD RIGHT FOREARM  Result Value Ref Range Status   Specimen Description BLOOD RIGHT FOREARM  Final   Special Requests   Final    BOTTLES DRAWN AEROBIC AND ANAEROBIC Blood Culture adequate volume   Culture   Final    NO GROWTH 1 DAY Performed at [facility], [place] [address] [place], [place] [address]    Report Status PENDING  Incomplete  Respiratory Panel by RT PCR (Flu A&B, Covid) - Nasopharyngeal Swab     Status: None   Collection Time: 03/04/20  5:50 AM   Specimen: Nasopharyngeal Swab  Result Value Ref Range Status   SARS Coronavirus  2 by RT PCR NEGATIVE NEGATIVE Final    Comment: (NOTE) SARS-CoV-2 target nucleic acids are NOT DETECTED.  The SARS-CoV-2 RNA is generally detectable in upper respiratoy specimens during the acute phase of infection. The lowest concentration of SARS-CoV-2 viral copies this assay can detect is 131 copies/mL. A negative result does not preclude SARS-Cov-2 infection and should not be used as the sole basis for treatment or other patient management decisions. A negative result may occur with  improper specimen  collection/handling, submission of specimen other than nasopharyngeal swab, presence of viral mutation(s) within the areas targeted by this assay, and inadequate number of viral copies (<131 copies/mL). A negative result must be combined with clinical observations, patient history, and epidemiological information. The expected result is Negative.  Fact Sheet for Patients:  PinkCheek.be  Fact Sheet for Healthcare Providers:  GravelBags.it  This test is no t yet approved or cleared by the [place] FDA and  has been authorized for detection and/or diagnosis of SARS-CoV-2 by FDA under an Emergency Use Authorization (EUA). This EUA will remain  in effect (meaning this test can be used) for the duration of the COVID-19 declaration under Section 564(b)(1) of the Act, 21 U.S.C. section 360bbb-3(b)(1), unless the authorization is terminated or revoked sooner.     Influenza A by PCR NEGATIVE NEGATIVE Final   Influenza B by PCR NEGATIVE NEGATIVE Final    Comment: (NOTE) The Xpert Xpress SARS-CoV-2/FLU/RSV assay is intended as an aid in  the diagnosis of influenza from Nasopharyngeal swab specimens and  should not be used as a sole basis for treatment. Nasal washings and  aspirates are unacceptable for Xpert Xpress SARS-CoV-2/FLU/RSV  testing.  Fact Sheet for Patients: PinkCheek.be  Fact Sheet  for Healthcare Providers: GravelBags.it  This test is not yet approved or cleared by the [place] FDA and  has been authorized for detection and/or diagnosis of SARS-CoV-2 by  FDA under an Emergency Use Authorization (EUA). This EUA will remain  in effect (meaning this test can be used) for the duration of the  Covid-19 declaration under Section 564(b)(1) of the Act, 21  U.S.C. section 360bbb-3(b)(1), unless the authorization is  terminated or revoked. Performed at [facility], [place] [address], [place], [place] [address]     Serology:    Jabier Mutton, [facility] [place] -- --  pager   [phone] cell 03/05/2020, 11:30 AM

## 2020-03-05 NOTE — Progress Notes (Signed)
PROGRESS NOTE                                                                                                                                                                                                             Patient Demographics:    Ryan [name], is a 86 y.o. male, DOB - 05-Mar-1930, GOT:[id number]  Outpatient Primary MD for the patient is Dorothyann Peng, NP    LOS - 1  Admit date - 03/03/2020    Chief Complaint  Patient presents with  . Back Pain       Brief Narrative  Ryan [name] is a 86 y.o. male with medical history significant of aortic stenosis status post TAVR (bioprosthetic valve) in 2018, history of recurrent Enterococcus faecalis bacteremia and prior bioprosthetic valve endocarditis and history of discitis, chronic combined systolic and diastolic CHF, paroxysmal atrial fibrillation, sick sinus syndrome status post PPM, CAD status post PCI, PVD, carotid artery disease status post bilateral CEA, CKD stage IV, chronic low back pain, asthma, GERD, hypertension, hyperlipidemia, OSA intolerant to CPAP.   He has chronic low back pain but it got slightly worse, saw his PCP where due to his past history blood cultures were drawn, 1 out of 2 cultures were positive for gram-positive cocci and he was sent to the ER for further work-up of possible bacteremia.   Subjective:   Patient in bed, appears comfortable, denies any headache, no fever, no chest pain or pressure, no shortness of breath , no abdominal pain. No focal weakness.  Improved low back pain.   Assessment  & Plan :     1. 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  His CT scan of L-spine although noncontrast appears satisfactory, procalcitonin and CRP are also reassuring.  He has no leukocytosis or fever and appears nontoxic.    He is currently on IV antibiotics and his oral amoxicillin is on hold.  Blood cultures drawn this admission are still pending however I would favor towards contamination as being the source of positive blood culture  in PCP office.  Will monitor another 24 hours and discussed with ID on 03/04/2020 and 03/05/2020.  Advance activity.  PT OT.  If new cultures remain negative likely discharge on 03/06/2020.    SpO2: 97 %  Recent Labs  Lab 03/02/20 1546 03/03/20 2036 03/04/20 0336 03/04/20 0550 03/05/20 0741  WBC 5.5 5.4  --   --  4.4  PLT 156 147*  --   --  150  CRP  --   --  0.6  --  0.7  BNP  --   --   --   --  51.5  PROCALCITON  --   --  <0.10  --  <0.10  AST  --   --   --   --  24  ALT  --   --   --   --  11  ALKPHOS  --   --   --   --  54  BILITOT  --   --   --   --  0.7  ALBUMIN  --   --   --   --  3.2*  SARSCOV2NAA  --   --   --  NEGATIVE  --      2.  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  As in above.  3.  Chronic diastolic CHF EF now 14% on recent echocardiogram.  Currently appears compensated hold dose of Imdur and beta-blocker have been continued.   4.  CKD stage IV.  Baseline creatinine around 2.  Monitor.  5.  Dyslipidemia.  On home dose statin.  6.  Paroxysmal atrial fibrillation.  Mali vas 2 score of greater than 3.  On beta-blocker and Eliquis.  7.  Anxiety.  As needed Ativan.  8.  Essential hypertension.  Home dose B blocker + Norvasc  9.  Hypothyroidism.  On Synthroid.  10.  GERD.  On PPI.    Condition - Fair  Family Communication  :   Son-in-law at bedside in detail 03/04/20 and 03/05/20  Wife Joycelyn Schmid [phone] on  03/05/2020 at 11:26 AM, mailbox full.    Code Status : Full  Consults  : Will discuss with  ID.  Procedures  : CT of L-spine.  Nonacute.  PUD Prophylaxis : PPI  Disposition Plan  :    Status is: Inpatient  Remains inpatient appropriate because:IV treatments appropriate due to intensity of illness or inability to take PO   Dispo: The patient is from: Home              Anticipated d/c is to: Home              Anticipated d/c date is: > 3 days              Patient currently is not medically stable to d/c.   DVT Prophylaxis  :  Eliquis  Lab Results  Component Value Date   PLT 150 03/05/2020    Diet :  Diet Order            Diet Heart Room service appropriate? Yes; Fluid consistency: Thin  Diet effective now                  Inpatient Medications  Scheduled Meds: . amLODipine  5 mg Oral Daily  . apixaban  2.5 mg Oral BID  . atorvastatin  40 mg Oral Daily  . cholecalciferol  1,000 Units Oral Once per day on Mon Thu  . isosorbide  mononitrate  60 mg Oral Daily  . latanoprost  1 drop Both Eyes QHS  . levothyroxine  25 mcg Oral Q0600  . metoprolol succinate  100 mg Oral Daily  . pantoprazole  40 mg Oral BID  . polyethylene glycol  17 g Oral Daily  . vitamin B-12  1,000 mcg Oral Daily   Continuous Infusions: . vancomycin 750 mg (03/05/20 0505)   PRN Meds:.acetaminophen **OR** acetaminophen, hydrALAZINE, LORazepam, nitroGLYCERIN  Antibiotics  :    Anti-infectives (From admission, onward)   Start     Dose/Rate Route Frequency Ordered Stop   03/05/20 0400  vancomycin (VANCOREADY) IVPB 750 mg/150 mL        750 mg 150 mL/hr over 60 Minutes Intravenous Every 24 hours 03/04/20 0508     03/04/20 0430  vancomycin (VANCOCIN) IVPB 1000 mg/200 mL premix        1,000 mg 200 mL/hr over 60 Minutes Intravenous  Once 03/04/20 0421 03/05/20 0644       Time Spent in minutes  30   Lala Lund M.D on 03/05/2020 at 11:22 AM  To page go to [email or web address] - password [facility]  [facility] -  Office  [phone]     See all Orders from today for further  details    Objective:   Vitals:   03/05/20 0029 03/05/20 0539 03/05/20 0840 03/05/20 0918  BP: (!) 156/80 (!) 160/66 (!) 152/66 (!) 152/66  Pulse: 63 61 83 83  Resp: 18 18    Temp: 98.2 F (36.8 C) 97.8 F (36.6 C)    TempSrc: Oral Oral    SpO2: 94% 93% 97%   Weight:      Height:        Wt Readings from Last 3 Encounters:  03/03/20 61.2 kg  11/17/19 60.8 kg  07/20/19 59.9 kg     Intake/Output Summary (Last 24 hours) at 03/05/2020 1122 Last data filed at 03/05/2020 0643 Gross per 24 hour  Intake 630 ml  Output 1 ml  Net 629 ml     Physical Exam  Awake Alert, No new F.N deficits, Normal affect [place].AT,PERRAL Supple Neck,No JVD, No cervical lymphadenopathy appriciated.  Symmetrical Chest wall movement, Good air movement bilaterally, CTAB RRR,No Gallops, Rubs or new Murmurs, No Parasternal Heave +ve B.Sounds, Abd Soft, No tenderness, No organomegaly appriciated, No rebound - guarding or rigidity. No Cyanosis, Clubbing or edema, No new Rash or bruise     Data Review:    CBC Recent Labs  Lab 03/02/20 1546 03/03/20 2036 03/05/20 0741  WBC 5.5 5.4 4.4  HGB 14.1 14.6 13.2  HCT 42.6 46.1 42.0  PLT 156 147* 150  MCV 94.0 95.4 97.0  MCH 31.1 30.2 30.5  MCHC 33.1 31.7 31.4  RDW 12.4 12.9 13.0  LYMPHSABS 1,287  --  0.8  MONOABS  --   --  0.5  EOSABS 160  --  0.3  BASOSABS 72  --  0.1    Recent Labs  Lab 03/03/20 2036 03/04/20 0336 03/05/20 0741  NA 139  --  141  K 4.5  --  3.6  CL 105  --  109  CO2 24  --  24  GLUCOSE 127*  --  100*  BUN 40*  --  36*  CREATININE 2.13*  --  2.25*  CALCIUM 9.5  --  9.1  AST  --   --  24  ALT  --   --  11  ALKPHOS  --   --  46  BILITOT  --   --  0.7  ALBUMIN  --   --  3.2*  MG  --   --  2.1  CRP  --  0.6 0.7  PROCALCITON  --  <0.10 <0.10  BNP  --   --  51.5    ------------------------------------------------------------------------------------------------------------------ No results for input(s): CHOL, HDL,  LDLCALC, TRIG, CHOLHDL, LDLDIRECT in the last 72 hours.  Lab Results  Component Value Date   HGBA1C 5.5 06/06/2016   ------------------------------------------------------------------------------------------------------------------ No results for input(s): TSH, T4TOTAL, T3FREE, THYROIDAB in the last 72 hours.  Invalid input(s): FREET3  Cardiac Enzymes No results for input(s): CKMB, TROPONINI, MYOGLOBIN in the last 168 hours.  Invalid input(s): CK ------------------------------------------------------------------------------------------------------------------    Component Value Date/Time   BNP 51.5 03/05/2020 0741    Micro Results Recent Results (from the past 240 hour(s))  Culture, Blood     Status: None (Preliminary result)   Collection Time: 03/02/20  3:44 PM   Specimen: Blood  Result Value Ref Range Status   MICRO NUMBER: [id number]  Preliminary   SPECIMEN QUALITY: Adequate  Preliminary   Source BLOOD  Preliminary   STATUS: PRELIMINARY  Preliminary   Result:   Preliminary    No growth to date. Culture is continuously monitored for a total of 120 hours incubation. A change in status will result in a phone report followed by an updated printed culture report. Visual inspection of blood culture bottles indicates that an  excessive volume of blood may have been collected. Detection of sepsis may be compromised.    COMMENT: Aerobic and anaerobic bottle received.  Preliminary  Culture, Blood     Status: Abnormal (Preliminary result)   Collection Time: 03/02/20  3:44 PM   Specimen: Blood  Result Value Ref Range Status   MICRO NUMBER: [id number]  Preliminary   SPECIMEN QUALITY: Adequate  Preliminary   Source BLOOD  Preliminary   STATUS: PRELIMINARY  Preliminary   Result:   Preliminary    Visual inspection of blood culture bottles indicates that an excessive volume of blood may have been collected. Detection of sepsis may be compromised.   ISOLATE 1: Gram positive cocci from  aerobic bottle only (AA)  Preliminary   COMMENT: Aerobic and anaerobic bottle received.  Preliminary  Culture, blood (routine x 2)     Status: None (Preliminary result)   Collection Time: 03/04/20  3:35 AM   Specimen: BLOOD  Result Value Ref Range Status   Specimen Description BLOOD LEFT ANTECUBITAL  Final   Special Requests   Final    BOTTLES DRAWN AEROBIC AND ANAEROBIC Blood Culture results may not be optimal due to an inadequate volume of blood received in culture bottles   Culture   Final    NO GROWTH 1 DAY Performed at [facility], [place] [address] [place], [place] [address]    Report Status PENDING  Incomplete  Culture, blood (routine x 2)     Status: None (Preliminary result)   Collection Time: 03/04/20  3:38 AM   Specimen: BLOOD RIGHT FOREARM  Result Value Ref Range Status   Specimen Description BLOOD RIGHT FOREARM  Final   Special Requests   Final    BOTTLES DRAWN AEROBIC AND ANAEROBIC Blood Culture adequate volume   Culture   Final    NO GROWTH 1 DAY Performed at [facility], [place] [address] [place], [place] [address]    Report Status PENDING  Incomplete  Respiratory Panel by RT PCR (  Flu A&B, Covid) - Nasopharyngeal Swab     Status: None   Collection Time: 03/04/20  5:50 AM   Specimen: Nasopharyngeal Swab  Result Value Ref Range Status   SARS Coronavirus 2 by RT PCR NEGATIVE NEGATIVE Final    Comment: (NOTE) SARS-CoV-2 target nucleic acids are NOT DETECTED.  The SARS-CoV-2 RNA is generally detectable in upper respiratoy specimens during the acute phase of infection. The lowest concentration of SARS-CoV-2 viral copies this assay can detect is 131 copies/mL. A negative result does not preclude SARS-Cov-2 infection and should not be used as the sole basis for treatment or other patient management decisions. A negative result may occur with  improper specimen collection/handling, submission of specimen other than nasopharyngeal swab, presence of viral  mutation(s) within the areas targeted by this assay, and inadequate number of viral copies (<131 copies/mL). A negative result must be combined with clinical observations, patient history, and epidemiological information. The expected result is Negative.  Fact Sheet for Patients:  PinkCheek.be  Fact Sheet for Healthcare Providers:  GravelBags.it  This test is no t yet approved or cleared by the [place] FDA and  has been authorized for detection and/or diagnosis of SARS-CoV-2 by FDA under an Emergency Use Authorization (EUA). This EUA will remain  in effect (meaning this test can be used) for the duration of the COVID-19 declaration under Section 564(b)(1) of the Act, 21 U.S.C. section 360bbb-3(b)(1), unless the authorization is terminated or revoked sooner.     Influenza A by PCR NEGATIVE NEGATIVE Final   Influenza B by PCR NEGATIVE NEGATIVE Final    Comment: (NOTE) The Xpert Xpress SARS-CoV-2/FLU/RSV assay is intended as an aid in  the diagnosis of influenza from Nasopharyngeal swab specimens and  should not be used as a sole basis for treatment. Nasal washings and  aspirates are unacceptable for Xpert Xpress SARS-CoV-2/FLU/RSV  testing.  Fact Sheet for Patients: PinkCheek.be  Fact Sheet for Healthcare Providers: GravelBags.it  This test is not yet approved or cleared by the [place] FDA and  has been authorized for detection and/or diagnosis of SARS-CoV-2 by  FDA under an Emergency Use Authorization (EUA). This EUA will remain  in effect (meaning this test can be used) for the duration of the  Covid-19 declaration under Section 564(b)(1) of the Act, 21  U.S.C. section 360bbb-3(b)(1), unless the authorization is  terminated or revoked. Performed at [facility], [place] [address], [place], [place] [address]     Radiology Reports CT Lumbar  Spine Wo Contrast  Result Date: 03/04/2020 CLINICAL DATA:  Low back pain for 1 week. Positive blood culture. No fever or leukocytosis. History of endocarditis. EXAM: CT LUMBAR SPINE WITHOUT CONTRAST TECHNIQUE: Multidetector CT imaging of the lumbar spine was performed without intravenous contrast administration. Multiplanar CT image reconstructions were also generated. COMPARISON:  11/07/2017 FINDINGS: Segmentation: 5 lumbar type vertebrae. Alignment: Moderate lower lumbar levoscoliosis. Unchanged grade 1 retrolisthesis of L1 on L2, L2 on L3, and L3 on L4. Unchanged 1.6 cm anterolisthesis of L5 on S1 secondary to L5 pars defects. Vertebrae: No acute fracture or suspicious osseous lesion. No interval disc or endplate destruction to indicate discitis-osteomyelitis. Similar appearance of sclerotic degenerative endplate changes at U7-2, L2-3, and L5-S1 associated with severe disc space narrowing and vacuum disc at these levels. Mild posterior disc space narrowing at L3-4 and L4-5. Paraspinal and other soft tissues: No evidence of significant paraspinal soft tissue inflammation or fluid collection. Prominent bladder distension. Abdominal aortic atherosclerosis without aneurysm.  Unchanged 9 mm hypodensity in the liver, likely a cyst. Small sliding hiatal hernia. Disc levels: T10-11: Schmorl's node.  No stenosis. T11-12: Schmorl's node.  No stenosis. T12-L1: Mild disc bulging without stenosis, unchanged. L1-2: Retrolisthesis with a circumferential disc osteophyte complex asymmetric to the left results in moderate left neural foraminal stenosis without significant spinal stenosis, unchanged. L2-3: Retrolisthesis with a circumferential disc osteophyte complex and right greater than left facet and ligamentum flavum hypertrophy result in asymmetric right lateral recess stenosis and moderate right and mild left neural foraminal stenosis without significant spinal stenosis, unchanged. L3-4: Retrolisthesis with bulging  uncovered disc and facet and ligamentum flavum hypertrophy result in right greater than left lateral recess stenosis and mild bilateral neural foraminal stenosis without significant spinal stenosis, unchanged. L4-5: Right eccentric disc bulging and facet hypertrophy result in mild right neural foraminal stenosis without significant spinal stenosis, unchanged. L5-S1: Anterolisthesis with disc uncovering and severe disc space height loss result in severe bilateral neural foraminal stenosis and mild spinal stenosis, unchanged. IMPRESSION: 1. No evidence of infection or other acute osseous abnormality in the lumbar spine. 2. Unchanged lumbar disc and facet degeneration. 3. 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 4. Aortic Atherosclerosis (ICD10-I70.0). Electronically Signed   By: Logan Bores M.D.   On: 03/04/2020 04:59

## 2020-03-05 NOTE — Progress Notes (Signed)
OT Cancellation Note  Patient Details Name: Ryan [name] MRN: [id number] DOB: 1930/03/01   Cancelled Treatment:    Reason Eval/Treat Not Completed: OT screened, no needs identified, will sign off. Pt reporting he feels close to "normal" and comfortable performing ADLs. Per PT evaluation, pt at baseline for functional mobility and balance. Pt reporting that his wife is at home with him. Will acute OT needs met and will screen and sign off.  [place], OTR/L Acute Rehab Pager: [phone] Office: [phone] 03/05/2020, 8:42 AM

## 2020-03-06 ENCOUNTER — Telehealth: Payer: Self-pay | Admitting: Adult Health

## 2020-03-06 LAB — CBC WITH DIFFERENTIAL/PLATELET
Abs Immature Granulocytes: 0.03 10*3/uL (ref 0.00–0.07)
Basophils Absolute: 0.1 10*3/uL (ref 0.0–0.1)
Basophils Relative: 2 %
Eosinophils Absolute: 0.2 10*3/uL (ref 0.0–0.5)
Eosinophils Relative: 5 %
HCT: 40 % (ref 39.0–52.0)
Hemoglobin: 12.9 g/dL — ABNORMAL LOW (ref 13.0–17.0)
Immature Granulocytes: 1 %
Lymphocytes Relative: 27 %
Lymphs Abs: 1.3 10*3/uL (ref 0.7–4.0)
MCH: 30.4 pg (ref 26.0–34.0)
MCHC: 32.3 g/dL (ref 30.0–36.0)
MCV: 94.3 fL (ref 80.0–100.0)
Monocytes Absolute: 0.7 10*3/uL (ref 0.1–1.0)
Monocytes Relative: 14 %
Neutro Abs: 2.5 10*3/uL (ref 1.7–7.7)
Neutrophils Relative %: 51 %
Platelets: 153 10*3/uL (ref 150–400)
RBC: 4.24 MIL/uL (ref 4.22–5.81)
RDW: 12.9 % (ref 11.5–15.5)
WBC: 4.8 10*3/uL (ref 4.0–10.5)
nRBC: 0 % (ref 0.0–0.2)

## 2020-03-06 LAB — BRAIN NATRIURETIC PEPTIDE: B Natriuretic Peptide: 65.9 pg/mL (ref 0.0–100.0)

## 2020-03-06 LAB — COMPREHENSIVE METABOLIC PANEL
ALT: 12 U/L (ref 0–44)
AST: 20 U/L (ref 15–41)
Albumin: 3.3 g/dL — ABNORMAL LOW (ref 3.5–5.0)
Alkaline Phosphatase: 59 U/L (ref 38–126)
Anion gap: 9 (ref 5–15)
BUN: 43 mg/dL — ABNORMAL HIGH (ref 8–23)
CO2: 23 mmol/L (ref 22–32)
Calcium: 9.1 mg/dL (ref 8.9–10.3)
Chloride: 109 mmol/L (ref 98–111)
Creatinine, Ser: 1.9 mg/dL — ABNORMAL HIGH (ref 0.61–1.24)
GFR calc Af Amer: 36 mL/min — ABNORMAL LOW (ref >60)
GFR calc non Af Amer: 31 mL/min — ABNORMAL LOW (ref >60)
Glucose, Bld: 92 mg/dL (ref 70–99)
Potassium: 3.9 mmol/L (ref 3.5–5.1)
Sodium: 141 mmol/L (ref 135–145)
Total Bilirubin: 0.5 mg/dL (ref 0.3–1.2)
Total Protein: 5.6 g/dL — ABNORMAL LOW (ref 6.5–8.1)

## 2020-03-06 LAB — PROCALCITONIN: Procalcitonin: <0.1 ng/mL

## 2020-03-06 LAB — MAGNESIUM: Magnesium: 2 mg/dL (ref 1.7–2.4)

## 2020-03-06 LAB — C-REACTIVE PROTEIN: CRP: 0.6 mg/dL (ref <1.0)

## 2020-03-06 MED ORDER — AMLODIPINE BESYLATE 10 MG PO TABS
10.0000 mg | ORAL_TABLET | Freq: Every day | ORAL | Status: DC
  Administered 2020-03-06: 10 mg via ORAL
  Filled 2020-03-06: qty 1

## 2020-03-06 MED ORDER — PANTOPRAZOLE SODIUM 40 MG PO TBEC: 40.0000 mg | DELAYED_RELEASE_TABLET | Freq: Every day | ORAL | Status: DC | PRN

## 2020-03-06 NOTE — Discharge Summary (Signed)
Ryan [name] AQT:[id number] DOB: 08/03/1929 DOA: 03/03/2020  PCP: Dorothyann Peng, NP  Admit date: 03/03/2020  Discharge date: 03/06/2020  Admitted From: Home   Disposition:  Home   Recommendations for Outpatient Follow-up:   Follow up with PCP in 1-2 weeks  PCP Please obtain BMP/CBC, 2 view CXR in 1week,  (see Discharge instructions)   PCP Please follow up on the following pending results: Monitor final blood culture results drawn in the office on 03/02/2020.   Home Health: None   Equipment/Devices: None  Consultations: ID Discharge Condition: Stable    CODE STATUS: Full    Diet Recommendation: Heart Healthy   Diet Order            Diet - low sodium heart healthy           Diet Heart Room service appropriate? Yes; Fluid consistency: Thin  Diet effective now                  Chief Complaint  Patient presents with  . Back Pain     Brief history of present illness from the day of admission and additional interim summary    Ryan [name] a 86 y.o.malewith medical history significant ofaortic stenosis status post TAVR (bioprosthetic valve) in 2018, history ofrecurrentEnterococcus faecalis bacteremia and prior bioprosthetic valveendocarditisand history of discitis, chronic combined systolic and diastolic CHF, paroxysmal atrial fibrillation, sick sinus syndrome status post PPM, CAD status post PCI, PVD, carotid artery disease status post bilateral CEA, CKD stage IV, chronic low back pain, asthma, GERD, hypertension, hyperlipidemia, OSA intolerant to CPAP.  He has chronic low back pain but it got slightly worse, saw his PCP where due to his past history blood cultures were drawn, 1 out of 2 cultures were positive for gram-positive cocci and he was sent to the ER for further work-up of possible  bacteremia.                                                                 Hospital Course   1. 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  His CT scan of L-spine although noncontrast appears satisfactory, procalcitonin and CRP are also reassuring.  He has no leukocytosis or fever and appears nontoxic.    Was given IV antibiotics empirically here, blood cultures here have remained negative for 48 hours, I have discussed this with micro lab and ID physician Dr. Linus Salmons today.  He is now close to his baseline will be discharged back home with outpatient PCP and ID follow-up.  Home medications will be unchanged.  Updated son-in-law on 03/06/2020 at 9.20 am, also called wife Ryan [name] on her cell phone [phone] on   03/06/2020 at 9:28 AM but the mailbox is full.   SpO2: 95 %  Recent Labs  Lab 03/02/20 1546 03/03/20 2036 03/04/20 0336 03/04/20 0550 03/05/20 0741 03/06/20 0321  WBC 5.5 5.4  --   --  4.4 4.8  CRP  --   --  0.6  --  0.7 0.6  BNP  --   --   --   --  51.5 65.9  PROCALCITON  --   --  <0.10  --  <0.10 <0.10  AST  --   --   --   --  24 20  ALT  --   --   --   --  11 12  ALKPHOS  --   --   --   --  54 59  BILITOT  --   --   --   --  0.7 0.5  ALBUMIN  --   --   --   --  3.2* 3.3*  SARSCOV2NAA  --   --   --  NEGATIVE  --   --       2.  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  As in above.  3.  Chronic diastolic CHF EF now 25% on recent echocardiogram.  Currently appears compensated home medications of Imdur, beta-blocker continued.   4.  CKD  stage IV.  Baseline creatinine around 2.  Stable.  5.  Dyslipidemia.  On home dose statin.  6.  Paroxysmal atrial fibrillation.  Mali vas 2 score of greater than 3.  On beta-blocker and Eliquis.  7.  Anxiety.  As needed Ativan.  8.  Essential hypertension.  Home dose B blocker + Norvasc  9.  Hypothyroidism.  On Synthroid.  10.  GERD.  On PPI as needed.   Discharge diagnosis     Principal Problem:   Bacteremia Active Problems:   Chronic combined systolic (congestive) and diastolic (congestive) heart failure (HCC)   Hypertension   HLD (hyperlipidemia)   Hypothyroidism   History of endocarditis    Discharge instructions    Discharge Instructions    Diet - low sodium heart healthy   Complete by: As directed    Discharge instructions   Complete by: As directed    Follow with Primary MD Dorothyann Peng, NP in 7 days   Get CBC, CMP, 2 view Chest X ray -  checked next visit within 1 week by Primary MD   Activity: As tolerated with Full fall precautions use walker/cane & assistance as needed  Disposition Home    Diet: Heart Healthy    Special Instructions: If you have smoked or chewed Tobacco  in the last 2 yrs please stop smoking, stop any regular Alcohol  and or any Recreational drug use.  On your next visit with your primary care physician please Get Medicines reviewed and adjusted.  Please request your Prim.MD to go over all Hospital Tests and Procedure/Radiological results at the follow up, please get all Hospital records sent to your Prim MD by signing hospital release before you go home.  If you experience worsening of your admission symptoms, develop shortness of breath, life threatening emergency, suicidal or homicidal thoughts you must seek medical attention immediately by  calling 911 or calling your MD immediately  if symptoms less severe.  You Must read complete instructions/literature along with all the possible adverse reactions/side effects for all the  Medicines you take and that have been prescribed to you. Take any new Medicines after you have completely understood and accpet all the possible adverse reactions/side effects.   Increase activity slowly   Complete by: As directed       Discharge Medications   Allergies as of 03/06/2020      Reactions   Ciprofloxacin Rash   Hydrochlorothiazide Rash   Sulfa Antibiotics Anaphylaxis   Ace Inhibitors Cough   Losartan Cough   Carvedilol Cough   Pt states it "causes him too cough."   Lisinopril Cough   Pt reports worsening cough and "feeling wheezy."   Soy Allergy Nausea And Vomiting      Medication List    TAKE these medications   albuterol 108 (90 Base) MCG/ACT inhaler Commonly known as: VENTOLIN HFA INHALE 2 PUFFS INTO THE LUNGS EVERY 6 HOURS AS NEEDED FOR WHEEZING OR SHORTNESS OF BREATH What changed: See the new instructions.   amLODipine 5 MG tablet Commonly known as: NORVASC TAKE 1 TABLET(5 MG) BY MOUTH DAILY What changed: See the new instructions.   amoxicillin 500 MG tablet Commonly known as: AMOXIL TAKE 1 TABLET(500 MG) BY MOUTH TWICE DAILY What changed: See the new instructions.   atorvastatin 40 MG tablet Commonly known as: LIPITOR TAKE 1 TABLET(40 MG) BY MOUTH DAILY AT 6 PM What changed: See the new instructions.   cholecalciferol 1000 units tablet Commonly known as: VITAMIN D Take 1,000 Units by mouth 2 (two) times a week. Monday and Thursday   Eliquis 2.5 MG Tabs tablet Generic drug: apixaban TAKE 1 TABLET(2.5 MG) BY MOUTH TWICE DAILY What changed: See the new instructions.   furosemide 20 MG tablet Commonly known as: LASIX Take 1 tablet (20 mg total) by mouth daily as needed for fluid or edema.   isosorbide mononitrate 60 MG 24 hr tablet Commonly known as: IMDUR Take 1 tablet (60 mg total) by mouth daily.   latanoprost 0.005 % ophthalmic solution Commonly known as: XALATAN Place 1 drop into both eyes at bedtime.   levothyroxine 25 MCG  tablet Commonly known as: SYNTHROID TAKE 1/2 TABLET BY MOUTH EVERY DAY BEFORE BREAKFAST What changed: See the new instructions.   metoprolol succinate 100 MG 24 hr tablet Commonly known as: TOPROL-XL TAKE 1 TABLET BY MOUTH DAILY, TAKE WITH OR IMMEDIATELY FOLLOWING A MEAL What changed: See the new instructions.   nitroGLYCERIN 0.4 MG SL tablet Commonly known as: NITROSTAT Place 1 tablet (0.4 mg total) under the tongue every 5 (five) minutes x 3 doses as needed for chest pain.   pantoprazole 40 MG tablet Commonly known as: PROTONIX Take 40 mg by mouth daily as needed (reflux).   polyethylene glycol 17 g packet Commonly known as: MIRALAX / GLYCOLAX Take 17 g by mouth daily as needed.   Rhopressa 0.02 % Soln Generic drug: Netarsudil Dimesylate Place 1 drop into both eyes at bedtime.   vitamin B-12 1000 MCG tablet Commonly known as: CYANOCOBALAMIN Take 1,000 mcg by mouth daily.        Follow-up Information    Dorothyann Peng, NP. Schedule an appointment as soon as possible for a visit in 1 week(s).   Specialty: Family Medicine Contact information: [place] [address] [phone]        Dorothy Spark, MD .  Specialty: Cardiology Contact information: [address] [place] [address] [phone]        Thayer Headings, MD. Schedule an appointment as soon as possible for a visit in 1 week(s).   Specialty: Infectious Diseases Contact information: [address] [place] [address] [phone]               Major procedures and Radiology Reports - PLEASE review detailed and final reports thoroughly  -       CT Lumbar Spine Wo Contrast  Result Date: 03/04/2020 CLINICAL DATA:  Low back pain for 1 week. Positive blood culture. No fever or leukocytosis. History of endocarditis. EXAM: CT LUMBAR SPINE WITHOUT CONTRAST TECHNIQUE: Multidetector CT imaging of the lumbar spine was performed without  intravenous contrast administration. Multiplanar CT image reconstructions were also generated. COMPARISON:  11/07/2017 FINDINGS: Segmentation: 5 lumbar type vertebrae. Alignment: Moderate lower lumbar levoscoliosis. Unchanged grade 1 retrolisthesis of L1 on L2, L2 on L3, and L3 on L4. Unchanged 1.6 cm anterolisthesis of L5 on S1 secondary to L5 pars defects. Vertebrae: No acute fracture or suspicious osseous lesion. No interval disc or endplate destruction to indicate discitis-osteomyelitis. Similar appearance of sclerotic degenerative endplate changes at P6-1, L2-3, and L5-S1 associated with severe disc space narrowing and vacuum disc at these levels. Mild posterior disc space narrowing at L3-4 and L4-5. Paraspinal and other soft tissues: No evidence of significant paraspinal soft tissue inflammation or fluid collection. Prominent bladder distension. Abdominal aortic atherosclerosis without aneurysm. Unchanged 9 mm hypodensity in the liver, likely a cyst. Small sliding hiatal hernia. Disc levels: T10-11: Schmorl's node.  No stenosis. T11-12: Schmorl's node.  No stenosis. T12-L1: Mild disc bulging without stenosis, unchanged. L1-2: Retrolisthesis with a circumferential disc osteophyte complex asymmetric to the left results in moderate left neural foraminal stenosis without significant spinal stenosis, unchanged. L2-3: Retrolisthesis with a circumferential disc osteophyte complex and right greater than left facet and ligamentum flavum hypertrophy result in asymmetric right lateral recess stenosis and moderate right and mild left neural foraminal stenosis without significant spinal stenosis, unchanged. L3-4: Retrolisthesis with bulging uncovered disc and facet and ligamentum flavum hypertrophy result in right greater than left lateral recess stenosis and mild bilateral neural foraminal stenosis without significant spinal stenosis, unchanged. L4-5: Right eccentric disc bulging and facet hypertrophy result in mild right  neural foraminal stenosis without significant spinal stenosis, unchanged. L5-S1: Anterolisthesis with disc uncovering and severe disc space height loss result in severe bilateral neural foraminal stenosis and mild spinal stenosis, unchanged. IMPRESSION: 1. No evidence of infection or other acute osseous abnormality in the lumbar spine. 2. Unchanged lumbar disc and facet degeneration. 3. 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 4. Aortic Atherosclerosis (ICD10-I70.0). Electronically Signed   By: Logan Bores M.D.   On: 03/04/2020 04:59    Micro Results     Recent Results (from the past 240 hour(s))  Culture, Blood     Status: None (Preliminary result)   Collection Time: 03/02/20  3:44 PM   Specimen: Blood  Result Value Ref Range Status   MICRO NUMBER: [id number]  Preliminary   SPECIMEN QUALITY: Adequate  Preliminary   Source BLOOD  Preliminary   STATUS: PRELIMINARY  Preliminary   Result:   Preliminary    No growth to date. Culture is continuously monitored for a total of 120 hours incubation. A change in status will result in a phone report followed by an updated  printed culture report. Visual inspection of blood culture bottles indicates that an  excessive volume of blood may have been collected. Detection of sepsis may be compromised.    COMMENT: Aerobic and anaerobic bottle received.  Preliminary  Culture, Blood     Status: Abnormal (Preliminary result)   Collection Time: 03/02/20  3:44 PM   Specimen: Blood  Result Value Ref Range Status   MICRO NUMBER: [id number]  Preliminary   SPECIMEN QUALITY: Adequate  Preliminary   Source BLOOD  Preliminary   STATUS: PRELIMINARY  Preliminary   Result:   Preliminary    Visual inspection of blood culture bottles indicates that an excessive volume of blood may have been collected. Detection of sepsis may be compromised.   ISOLATE 1: Gram positive cocci from aerobic bottle only (AA)   Preliminary   COMMENT: Aerobic and anaerobic bottle received.  Preliminary  Culture, blood (routine x 2)     Status: None (Preliminary result)   Collection Time: 03/04/20  3:35 AM   Specimen: BLOOD  Result Value Ref Range Status   Specimen Description BLOOD LEFT ANTECUBITAL  Final   Special Requests   Final    BOTTLES DRAWN AEROBIC AND ANAEROBIC Blood Culture results may not be optimal due to an inadequate volume of blood received in culture bottles   Culture   Final    NO GROWTH 2 DAYS Performed at [facility], [place] [address] [place], [place] [address]    Report Status PENDING  Incomplete  Culture, blood (routine x 2)     Status: None (Preliminary result)   Collection Time: 03/04/20  3:38 AM   Specimen: BLOOD RIGHT FOREARM  Result Value Ref Range Status   Specimen Description BLOOD RIGHT FOREARM  Final   Special Requests   Final    BOTTLES DRAWN AEROBIC AND ANAEROBIC Blood Culture adequate volume   Culture   Final    NO GROWTH 2 DAYS Performed at [facility], [place] [address], [place], [place] [address]    Report Status PENDING  Incomplete  Respiratory Panel by RT PCR (Flu A&B, Covid) - Nasopharyngeal Swab     Status: None   Collection Time: 03/04/20  5:50 AM   Specimen: Nasopharyngeal Swab  Result Value Ref Range Status   SARS Coronavirus 2 by RT PCR NEGATIVE NEGATIVE Final    Comment: (NOTE) SARS-CoV-2 target nucleic acids are NOT DETECTED.  The SARS-CoV-2 RNA is generally detectable in upper respiratoy specimens during the acute phase of infection. The lowest concentration of SARS-CoV-2 viral copies this assay can detect is 131 copies/mL. A negative result does not preclude SARS-Cov-2 infection and should not be used as the sole basis for treatment or other patient management decisions. A negative result may occur with  improper specimen collection/handling, submission of specimen other than nasopharyngeal swab, presence of viral mutation(s) within the areas  targeted by this assay, and inadequate number of viral copies (<131 copies/mL). A negative result must be combined with clinical observations, patient history, and epidemiological information. The expected result is Negative.  Fact Sheet for Patients:  PinkCheek.be  Fact Sheet for Healthcare Providers:  GravelBags.it  This test is no t yet approved or cleared by the [place] FDA and  has been authorized for detection and/or diagnosis of SARS-CoV-2 by FDA under an Emergency Use Authorization (EUA). This EUA will remain  in effect (meaning this test can be used) for the duration of the COVID-19 declaration under Section 564(b)(1) of the Act,  21 U.S.C. section 360bbb-3(b)(1), unless the authorization is terminated or revoked sooner.     Influenza A by PCR NEGATIVE NEGATIVE Final   Influenza B by PCR NEGATIVE NEGATIVE Final    Comment: (NOTE) The Xpert Xpress SARS-CoV-2/FLU/RSV assay is intended as an aid in  the diagnosis of influenza from Nasopharyngeal swab specimens and  should not be used as a sole basis for treatment. Nasal washings and  aspirates are unacceptable for Xpert Xpress SARS-CoV-2/FLU/RSV  testing.  Fact Sheet for Patients: PinkCheek.be  Fact Sheet for Healthcare Providers: GravelBags.it  This test is not yet approved or cleared by the [place] FDA and  has been authorized for detection and/or diagnosis of SARS-CoV-2 by  FDA under an Emergency Use Authorization (EUA). This EUA will remain  in effect (meaning this test can be used) for the duration of the  Covid-19 declaration under Section 564(b)(1) of the Act, 21  U.S.C. section 360bbb-3(b)(1), unless the authorization is  terminated or revoked. Performed at [facility], [place] [address], [place], [place] [address]     Today   Subjective    Ryan [name] today has no  headache,no chest abdominal pain,no new weakness tingling or numbness, feels much better wants to go home today.  Stable chronic back pain.   Objective   Blood pressure (!) 151/76, pulse 61, temperature 97.7 F (36.5 C), temperature source Oral, resp. rate 16, height 5\' 7"  (1.702 m), weight 61.2 kg, SpO2 95 %.   Intake/Output Summary (Last 24 hours) at 03/06/2020 0924 Last data filed at 03/05/2020 2300 Gross per 24 hour  Intake 240 ml  Output --  Net 240 ml    Exam  Awake Alert, No new F.N deficits, Normal affect [place].AT,PERRAL Supple Neck,No JVD, No cervical lymphadenopathy appriciated.  Symmetrical Chest wall movement, Good air movement bilaterally, CTAB RRR,No Gallops,Rubs or new Murmurs, No Parasternal Heave +ve B.Sounds, Abd Soft, Non tender, No organomegaly appriciated, No rebound -guarding or rigidity. No Cyanosis, Clubbing or edema, No new Rash or bruise   Data Review   CBC w Diff:  Lab Results  Component Value Date   WBC 4.8 03/06/2020   HGB 12.9 (L) 03/06/2020   HGB 14.5 12/28/2019   HCT 40.0 03/06/2020   HCT 42.4 12/28/2019   PLT 153 03/06/2020   PLT 160 12/28/2019   LYMPHOPCT 27 03/06/2020   MONOPCT 14 03/06/2020   EOSPCT 5 03/06/2020   BASOPCT 2 03/06/2020    CMP:  Lab Results  Component Value Date   NA 141 03/06/2020   NA 140 12/28/2019   K 3.9 03/06/2020   CL 109 03/06/2020   CO2 23 03/06/2020   BUN 43 (H) 03/06/2020   BUN 41 (H) 12/28/2019   CREATININE 1.90 (H) 03/06/2020   CREATININE 1.18 (H) 05/22/2016   GLU 124 04/06/2018   PROT 5.6 (L) 03/06/2020   PROT 6.4 12/28/2019   ALBUMIN 3.3 (L) 03/06/2020   ALBUMIN 4.3 12/28/2019   BILITOT 0.5 03/06/2020   BILITOT 0.5 12/28/2019   ALKPHOS 59 03/06/2020   AST 20 03/06/2020   ALT 12 03/06/2020  .   Total Time in preparing paper work, data evaluation and todays exam - 70 minutes  Lala Lund M.D on 03/06/2020 at 9:24 AM  [facility]   Office  [phone]

## 2020-03-06 NOTE — Discharge Instructions (Signed)
Follow with Primary MD Dorothyann Peng, NP in 7 days   Get CBC, CMP, 2 view Chest X ray -  checked next visit within 1 week by Primary MD   Activity: As tolerated with Full fall precautions use walker/cane & assistance as needed  Disposition Home    Diet: Heart Healthy    Special Instructions: If you have smoked or chewed Tobacco  in the last 2 yrs please stop smoking, stop any regular Alcohol  and or any Recreational drug use.  On your next visit with your primary care physician please Get Medicines reviewed and adjusted.  Please request your Prim.MD to go over all Hospital Tests and Procedure/Radiological results at the follow up, please get all Hospital records sent to your Prim MD by signing hospital release before you go home.  If you experience worsening of your admission symptoms, develop shortness of breath, life threatening emergency, suicidal or homicidal thoughts you must seek medical attention immediately by calling 911 or calling your MD immediately  if symptoms less severe.  You Must read complete instructions/literature along with all the possible adverse reactions/side effects for all the Medicines you take and that have been prescribed to you. Take any new Medicines after you have completely understood and accpet all the possible adverse reactions/side effects.   ================================================================================================  Information on my medicine - ELIQUIS (apixaban)  Why was Eliquis prescribed for you? Eliquis was prescribed for you to reduce the risk of a blood clot forming that can cause a stroke if you have a medical condition called atrial fibrillation (a type of irregular heartbeat).  What do You need to know about Eliquis ? Take your Eliquis TWICE DAILY - one tablet in the morning and one tablet in the evening with or without food. If you have difficulty swallowing the tablet whole please discuss with your pharmacist how  to take the medication safely.  Take Eliquis exactly as prescribed by your doctor and DO NOT stop taking Eliquis without talking to the doctor who prescribed the medication.  Stopping may increase your risk of developing a stroke.  Refill your prescription before you run out.  After discharge, you should have regular check-up appointments with your healthcare provider that is prescribing your Eliquis.  In the future your dose may need to be changed if your kidney function or weight changes by a significant amount or as you get older.  What do you do if you miss a dose? If you miss a dose, take it as soon as you remember on the same day and resume taking twice daily.  Do not take more than one dose of ELIQUIS at the same time to make up a missed dose.  Important Safety Information A possible side effect of Eliquis is bleeding. You should call your healthcare provider right away if you experience any of the following: ? Bleeding from an injury or your nose that does not stop. ? Unusual colored urine (red or dark brown) or unusual colored stools (red or black). ? Unusual bruising for unknown reasons. ? A serious fall or if you hit your head (even if there is no bleeding).  Some medicines may interact with Eliquis and might increase your risk of bleeding or clotting while on Eliquis. To help avoid this, consult your healthcare provider or pharmacist prior to using any new prescription or non-prescription medications, including herbals, vitamins, non-steroidal anti-inflammatory drugs (NSAIDs) and supplements.  This website has more information on Eliquis (apixaban): http://www.eliquis.com/eliquis/home

## 2020-03-06 NOTE — Progress Notes (Signed)
Entered patients room and introduced myself. Wife at bedside, she immediately begins questioning me about his medications, calling the staff in the hospital liars. She is yelling, she is concerned about his eye drops not being ordered correctly. I explain that i'm sorry for any misunderstandings that may have happened, that I will do what I can to investigate the matter. I ask her to please use a calm manner with speaking to me. She laughs and continues to yell and calling me a liar. I have contacted the physician to resolve the issue about eyedrops and he will call the room shortly to speak with them.

## 2020-03-06 NOTE — Telephone Encounter (Signed)
Spoke with Estill Bamberg and informed her our office does not send lab results without a signed release from the pt and our fax number was given.  Estill Bamberg stated she is calling from Infectious Diseases at [facility] and I informed her in this case, the results would be in the pts chart and this could be viewed through their system via Epic.

## 2020-03-06 NOTE — Telephone Encounter (Signed)
Transition Care Management Unsuccessful Follow-up Telephone Call  Date of discharge and from where:  03/06/2020 from [facility]   Attempts:  1st Attempt  Reason for unsuccessful TCM follow-up call:  Left voice message

## 2020-03-06 NOTE — Telephone Encounter (Signed)
Transition Care Management Follow-up Telephone Call Date of discharge and from where: 03/06/2020 from [facility]  How have you been since you were released from the hospital? Patient states he is doing well. He is just trying to rest since being back home Any questions or concerns? No  Items Reviewed: Did the pt receive and understand the discharge instructions provided? Yes  Medications obtained and verified? Yes  Any new allergies since your discharge? No  Dietary orders reviewed? Yes Do you have support at home? Yes   Functional Questionnaire: (I = Independent and D = Dependent) ADLs: I  Bathing/Dressing- I  Meal Prep- D  Eating- I  Maintaining continence- I  Transferring/Ambulation- I  Managing Meds- I  Follow up appointments reviewed:  PCP Hospital f/u appt confirmed? Yes  Scheduled to see Dorothyann Peng  on 03/15/2020 @ 8:30 am. [facility] f/u appt confirmed? Yes  Scheduled to see Dr. Linus Salmons  on 03/20/2020 . Are transportation arrangements needed? No  If their condition worsens, is the pt aware to call PCP or go to the Emergency Dept.? Yes Was the patient provided with contact information for the PCP's office or ED? Yes Was to pt encouraged to call back with questions or concerns? No

## 2020-03-06 NOTE — Telephone Encounter (Signed)
Estill Bamberg is calling in stating that they spoke with someone in the office to get the most recent labs sent to them Internal Medicine (Infectious Disease Dept is needing the results)  [phone] (F)

## 2020-03-08 LAB — CULTURE, BLOOD (SINGLE)
MICRO NUMBER:: 11021216
MICRO NUMBER:: 11021218
SPECIMEN QUALITY:: ADEQUATE
SPECIMEN QUALITY:: ADEQUATE

## 2020-03-09 LAB — CULTURE, BLOOD (ROUTINE X 2)
Culture: NO GROWTH
Culture: NO GROWTH
Special Requests: ADEQUATE

## 2020-03-12 DIAGNOSIS — H18003 Unspecified corneal deposit, bilateral: Secondary | ICD-10-CM | POA: Diagnosis not present

## 2020-03-12 DIAGNOSIS — H401223 Low-tension glaucoma, left eye, severe stage: Secondary | ICD-10-CM | POA: Diagnosis not present

## 2020-03-12 DIAGNOSIS — H26493 Other secondary cataract, bilateral: Secondary | ICD-10-CM | POA: Diagnosis not present

## 2020-03-12 DIAGNOSIS — H401213 Low-tension glaucoma, right eye, severe stage: Secondary | ICD-10-CM | POA: Diagnosis not present

## 2020-03-14 NOTE — Progress Notes (Signed)
Subjective:    Patient ID: Ryan [name], male    DOB: 02/02/1930, 86 y.o.   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), Anemia (12/2017), Arthritis, Asthma, Bacterial endocarditis, Carotid artery obstruction, Chronic combined systolic (congestive) and diastolic (congestive) heart failure (HCC) (03/18/2016), Chronic lower back pain, CKD (chronic kidney disease) stage 4, GFR 15-29 ml/min (HCC) (12/2017), Claudication in peripheral vascular disease ([place]) (01/01/2018), Coronary arteriosclerosis in native artery, Decreased diffusion capacity (10/23/2017), Diverticulitis of colon, DJD (degenerative joint disease), Elevated TSH (06/30/2017), GERD (gastroesophageal reflux disease), History of hiatal hernia, HLD (hyperlipidemia), Hypertension, Hypertensive heart disease with heart failure ([place]), Intractable back pain (11/07/2017), OSA (obstructive sleep apnea), PAF (paroxysmal atrial fibrillation) ([place]), Paroxysmal supraventricular tachycardia ([place]), Presence of permanent cardiac pacemaker, Primary malignant neoplasm of bladder ([place]), Prostate cancer ([place]), 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  He presents to the office today for TCM visit   Admit Date 03/03/2020 Discharge Date 03/06/2020  Patient with chronic low back pain but was getting slightly worse, this writer through 2 sets of blood cultures due to history of recurrent Enterococcus bacteremia with prior bioprosthetic valve endocarditis and history of discitis.  1 out of 2 cultures were positive for gram-positive cocci and he was sent to the ER for further work-up of possible bacteremia  Hospital Course  1.  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  First blood cultures appeared to be consistent with contamination.  His CT scan of the L-spine although noncontrast appears satisfactory, pro calcitonin and CRP were reassuring.  He had no leukocytosis or fever and appeared nontoxic.  He was given IV antibiotics empirically while admitted.  2.  Chronic diastolic CHF-EF now 56% on recent echocardiogram.  He appeared compensated with home medications of Imdur and beta-blocker  3.  CKD stage IV-baseline creatinine around 2, this stayed stable during hospital admission  4.  Dyslipidemia-continued on home dose of statin  5.  Paroxysmal atrial fibrillation-continued on beta-blocker and Eliquis  #6 anxiety-as needed Ativan  7.  Hypertension-Home dose beta-blocker plus Norvasc.  Stayed stable during hospital admission  8.  Hypothyroidism-continued on home dose of Synthroid  9.  GERD-was continued on PPI as needed  Recommended follow-up BMP/CBC, and 2 view chest x-ray  Today he presents in the office with his wife.  He reports that overall he is feeling well but continues to have significant low right-sided back pain.  His CT scan of the lumbar spine showed  IMPRESSION: 1. No evidence of infection or other acute osseous abnormality in the lumbar spine. 2. Unchanged lumbar disc and facet degeneration. 3. 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 4. Aortic Atherosclerosis (ICD10-I70.0).  At home he has been using a heating pad, and various sports creams along with Tylenol-these modalities have produced mild relief of his pain.  Pain varies throughout the day today the pain is less than yesterday.  Patient reports "it is a nagging pain that just drains all my energy".  He is using either a cane or rolling  walker for ambulation.  He continues to exercise with a stationary  bike.  He denies any falls.  He has not developed fevers, chills, shortness of breath, chest pain or feeling acutely ill since being discharged from the hospital   Review of Systems  Constitutional: Negative.   HENT: Negative.   Eyes: Negative.   Respiratory: Negative.   Cardiovascular: Negative.   Gastrointestinal: Negative.   Endocrine: Negative.   Genitourinary: Negative.   Musculoskeletal: Positive for arthralgias, back pain, gait problem and myalgias.  Skin: Negative.   Allergic/Immunologic: Negative.   Hematological: Negative.   Psychiatric/Behavioral: Negative.   All other systems reviewed and are negative.  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  iron deficient, ferritin 17 04/2018   Arthritis    "hands, lower back" (04/30/2016)   Asthma    Bacterial endocarditis    Carotid artery obstruction    a. s/p bilat CEA.   Chronic combined systolic (congestive) and diastolic (congestive) heart failure (HCC) 03/18/2016   Chronic lower back pain    CKD (chronic kidney disease) stage 4, GFR 15-29 ml/min (HCC) 12/2017   Claudication in peripheral vascular disease ([place]) 01/01/2018   Claudication   Coronary arteriosclerosis in native artery    a. 2017 Cardiac catheterization demonstrated worsening CAD with 70% mid LCx, 80% OM1 and 80% mid RCA stenosis. b.  In 11/17, he underwent successful rotational atherectomy and DES to RCA.   Decreased diffusion capacity 10/23/2017   Diverticulitis of colon    DJD (degenerative joint disease)    Elevated TSH 06/30/2017   GERD (gastroesophageal reflux disease)    History of hiatal hernia    HLD (hyperlipidemia)    Hypertension    Hypertensive heart disease with heart failure (HCC)    Intractable back pain 11/07/2017   OSA  (obstructive sleep apnea)    intolerant to CPAP   PAF (paroxysmal atrial fibrillation) (HCC)    Paroxysmal supraventricular tachycardia (HCC)    Presence of permanent cardiac pacemaker    Primary malignant neoplasm of bladder ([place])    Prostate cancer ([place])    Prosthetic valve endocarditis ([place]) 11/10/2017   enterococcal bacteremia   S/P AVR 12/02/2017   Syncope 10/23/2017   Tachy-brady syndrome ([place])    Thrombocytopenia ([place]) 11/07/2017   Urinary retention 11/07/2017    Social History   Socioeconomic History   Marital status: Married    Spouse name: Not on file   Number of children: Not on file   Years of education: Not on file   Highest education level: Not on file  Occupational History   Occupation: retired  Tobacco Use   Smoking status: Never Smoker   Smokeless tobacco: Never Used  Scientific laboratory technician Use: Never used  Substance and Sexual Activity   Alcohol use: No    Alcohol/week: 0.0 standard drinks    Comment: 04/30/2016 "nothing in years"   Drug use: No   Sexual activity: Never  Other Topics Concern   Not on file  Social History Narrative   Retired    Three children - One in [place], One in [place], and one in [place].       Social Determinants of Health   Financial Resource Strain:    Difficulty of Paying Living Expenses: Not on file  Food Insecurity:    Worried About [place] in the Last Year: Not on file   [employer] of Food in  the Last Year: Not on file  Transportation Needs:    Lack of Transportation (Medical): Not on file   Lack of Transportation (Non-Medical): Not on file  Physical Activity:    Days of Exercise per Week: Not on file   Minutes of Exercise per Session: Not on file  Stress:    Feeling of Stress : Not on file  Social Connections:    Frequency of Communication with Friends and Family: Not on file   Frequency of Social Gatherings with Friends and Family: Not on file   Attends Religious Services: Not  on file   Active Member of Clubs or Organizations: Not on file   Attends Archivist Meetings: Not on file   Marital Status: Not on file  Intimate Partner Violence:    Fear of Current or Ex-Partner: Not on file   Emotionally Abused: Not on file   Physically Abused: Not on file   Sexually Abused: Not on file    Past Surgical History:  Procedure Laterality Date   APPENDECTOMY     CARDIAC CATHETERIZATION N/A 01/17/2016   Procedure: Right/Left Heart Cath and Coronary Angiography;  Surgeon: Belva Crome, MD;  Location: [place] [facility];  Service: Cardiovascular;  Laterality: N/A;   CARDIAC CATHETERIZATION N/A 04/30/2016   Procedure: Coronary/Graft Atherectomy;  Surgeon: Sherren Mocha, MD;  Location: [place] [facility];  Service: Cardiovascular;  Laterality: N/A;   CAROTID ENDARTERECTOMY Bilateral    CATARACT EXTRACTION W/ INTRAOCULAR LENS  IMPLANT, BILATERAL Bilateral    COLON SURGERY  1981   Meckles Diverticulum with volvulus   COLONOSCOPY WITH PROPOFOL N/A 05/01/2018   Procedure: COLONOSCOPY WITH PROPOFOL;  Surgeon: Rush Landmark Telford Nab., MD;  Location: [place];  Service: Gastroenterology;  Laterality: N/A;   CORONARY ANGIOGRAPHY N/A 01/18/2018   Procedure: CORONARY ANGIOGRAPHY;  Surgeon: Lorretta Harp, MD;  Location: [place] [facility];  Service: Cardiovascular;  Laterality: N/A;   ENTEROSCOPY N/A 04/30/2018   Procedure: ENTEROSCOPY;  Surgeon: Rush Landmark Telford Nab., MD;  Location: [place];  Service: Gastroenterology;  Laterality: N/A;   EP IMPLANTABLE DEVICE N/A 03/21/2016   Procedure: Pacemaker Implant;  Surgeon: Evans Lance, MD;  Location: [place] [facility];  Service: Cardiovascular;  Laterality: N/A;   EYE SURGERY     INGUINAL HERNIA REPAIR Right 2009   INSERT / REPLACE / REMOVE PACEMAKER     INSERTION PROSTATE RADIATION SEED     POLYPECTOMY  05/01/2018   Procedure: POLYPECTOMY;  Surgeon: Rush Landmark Telford Nab., MD;   Location: [place];  Service: Gastroenterology;;   TEE WITHOUT CARDIOVERSION N/A 06/10/2016   Procedure: TRANSESOPHAGEAL ECHOCARDIOGRAM (TEE);  Surgeon: Sherren Mocha, MD;  Location: [place];  Service: Open Heart Surgery;  Laterality: N/A;   TEE WITHOUT CARDIOVERSION N/A 11/10/2017   Procedure: TRANSESOPHAGEAL ECHOCARDIOGRAM (TEE);  Surgeon: Acie Fredrickson Wonda Cheng, MD;  Location: [facility]OSCOPY;  Service: Cardiovascular;  Laterality: N/A;   TEE WITHOUT CARDIOVERSION N/A 12/15/2017   Procedure: TRANSESOPHAGEAL ECHOCARDIOGRAM (TEE);  Surgeon: Buford Dresser, MD;  Location: [facility] ENDOSCOPY;  Service: Cardiovascular;  Laterality: N/A;   TEE WITHOUT CARDIOVERSION N/A 03/30/2018   Procedure: TRANSESOPHAGEAL ECHOCARDIOGRAM (TEE);  Surgeon: Jerline Pain, MD;  Location: [facility]COPY;  Service: Cardiovascular;  Laterality: N/A;   TONSILLECTOMY  ~ 1947   TRANSCATHETER AORTIC VALVE REPLACEMENT, TRANSFEMORAL N/A 06/10/2016   Procedure: TRANSCATHETER AORTIC VALVE REPLACEMENT, TRANSFEMORAL;  Surgeon: Sherren Mocha, MD;  Location: [place];  Service: Open Heart Surgery;  Laterality: N/A;    Family History  Problem Relation Age of Onset   Heart disease Mother    Heart disease Father     Allergies  Allergen Reactions   Ciprofloxacin Rash   Hydrochlorothiazide Rash   Sulfa Antibiotics Anaphylaxis   Ace Inhibitors Cough   Losartan Cough   Carvedilol Cough    Pt states it "causes him too cough."   Lisinopril Cough    Pt reports worsening cough and "feeling wheezy."   Soy Allergy Nausea And Vomiting    Current Outpatient Medications on File Prior to Visit  Medication Sig Dispense Refill   albuterol (VENTOLIN HFA) 108 (90 Base) MCG/ACT inhaler INHALE 2 PUFFS INTO THE LUNGS EVERY 6 HOURS AS NEEDED FOR WHEEZING OR SHORTNESS OF BREATH (Patient taking differently: Inhale 2 puffs into the lungs every 6 (six) hours as needed for wheezing or shortness of breath. ) 6.7 g 2   amLODipine (NORVASC) 5  MG tablet TAKE 1 TABLET(5 MG) BY MOUTH DAILY (Patient taking differently: Take 5 mg by mouth daily. ) 90 tablet 3   amoxicillin (AMOXIL) 500 MG tablet TAKE 1 TABLET(500 MG) BY MOUTH TWICE DAILY (Patient taking differently: Take 500 mg by mouth 2 (two) times daily. ) 60 tablet 11   atorvastatin (LIPITOR) 40 MG tablet TAKE 1 TABLET(40 MG) BY MOUTH DAILY AT 6 PM (Patient taking differently: Take 40 mg by mouth daily at 6 PM. ) 90 tablet 2   cholecalciferol (VITAMIN D) 1000 units tablet Take 1,000 Units by mouth 2 (two) times a week. Monday and Thursday     ELIQUIS 2.5 MG TABS tablet TAKE 1 TABLET(2.5 MG) BY MOUTH TWICE DAILY (Patient taking differently: Take 2.5 mg by mouth 2 (two) times daily. ) 180 tablet 1   furosemide (LASIX) 20 MG tablet Take 1 tablet (20 mg total) by mouth daily as needed for fluid or edema. 30 tablet 3   isosorbide mononitrate (IMDUR) 60 MG 24 hr tablet Take 1 tablet (60 mg total) by mouth daily. 90 tablet 3   latanoprost (XALATAN) 0.005 % ophthalmic solution Place 1 drop into both eyes at bedtime.     levothyroxine (SYNTHROID) 25 MCG tablet TAKE 1/2 TABLET BY MOUTH EVERY DAY BEFORE BREAKFAST (Patient taking differently: Take 12.5 mcg by mouth daily before breakfast. ) 45 tablet 3   metoprolol succinate (TOPROL-XL) 100 MG 24 hr tablet TAKE 1 TABLET BY MOUTH DAILY, TAKE WITH OR IMMEDIATELY FOLLOWING A MEAL (Patient taking differently: Take 100 mg by mouth daily. ) 90 tablet 3   Netarsudil Dimesylate (RHOPRESSA) 0.02 % SOLN Place 1 drop into both eyes at bedtime.     nitroGLYCERIN (NITROSTAT) 0.4 MG SL tablet Place 1 tablet (0.4 mg total) under the tongue every 5 (five) minutes x 3 doses as needed for chest pain. 25 tablet 3   pantoprazole (PROTONIX) 40 MG tablet Take 40 mg by mouth daily as needed (reflux).      polyethylene glycol (MIRALAX / GLYCOLAX) 17 g packet Take 17 g by mouth daily as needed.     vitamin B-12 (CYANOCOBALAMIN) 1000 MCG tablet Take 1,000 mcg by  mouth daily.     No current facility-administered medications on file prior to visit.    BP (!) 150/78 (BP Location: Left Arm, Patient Position: Sitting, Cuff Size: Normal)    Pulse 74    Temp 97.6 F (36.4 C) (Oral)    Ht _0  (1.702 m)    Wt 136 lb 1.6 oz (61.7 kg)    SpO2 95%  BMI 21.32 kg/m       Objective:   Physical Exam Vitals and nursing note reviewed.  Constitutional:      Appearance: Normal appearance.  Cardiovascular:     Rate and Rhythm: Normal rate and regular rhythm.     Pulses: Normal pulses.     Heart sounds: Normal heart sounds.  Pulmonary:     Effort: Pulmonary effort is normal.     Breath sounds: Normal breath sounds.  Musculoskeletal:        General: Tenderness (lower lumbar spine and right buttocks) present.  Skin:    General: Skin is warm and dry.  Neurological:     General: No focal deficit present.     Mental Status: He is alert and oriented to person, place, and time.     Gait: Gait abnormal.     Comments: Walking with cane, has slow steady gait   Psychiatric:        Mood and Affect: Mood normal.        Behavior: Behavior normal.        Thought Content: Thought content normal.        Judgment: Judgment normal.       Assessment & Plan:  1. Chronic right-sided low back pain without sciatica -Advised to continue to use heating pad and topical sports creams.  Will send him to physical therapy to see if we can get him some more pain relief.  We will send in 10 tabs of Tylenol 3 for breakthrough pain. - Ambulatory referral to Physical Therapy - CBC with Differential/Platelet; Future - BMP with eGFR([facility]); Future - BMP with eGFR([facility]) - CBC with Differential/Platelet  2. Bacteremia -Viewed hospital discharge notes, imaging, and labs with the patient and his wife.  All questions answered to the best of my ability.  Thankfully second set of blood cultures were negative.  He is not feeling acutely ill at this time.  3. History of  endocarditis -Continue with daily antibiotics.  Follow-up with cardiology as directed  4. Chronic diastolic CHF (congestive heart failure) (HCC) -Continue with home meds.  Follow-up with cardiology as directed  5. Need for immunization against influenza  - Flu Vaccine QUAD High Dose(Fluad)   Dorothyann Peng, NP

## 2020-03-15 ENCOUNTER — Ambulatory Visit (INDEPENDENT_AMBULATORY_CARE_PROVIDER_SITE_OTHER): Payer: Medicare Other | Admitting: Adult Health

## 2020-03-15 ENCOUNTER — Other Ambulatory Visit: Payer: Self-pay

## 2020-03-15 ENCOUNTER — Encounter: Payer: Self-pay | Admitting: Adult Health

## 2020-03-15 VITALS — BP 150/78 | HR 74 | Temp 97.6°F | Ht 67.0 in | Wt 136.1 lb

## 2020-03-15 DIAGNOSIS — R7881 Bacteremia: Secondary | ICD-10-CM | POA: Diagnosis not present

## 2020-03-15 DIAGNOSIS — M545 Low back pain, unspecified: Secondary | ICD-10-CM | POA: Diagnosis not present

## 2020-03-15 DIAGNOSIS — I5032 Chronic diastolic (congestive) heart failure: Secondary | ICD-10-CM

## 2020-03-15 DIAGNOSIS — Z8679 Personal history of other diseases of the circulatory system: Secondary | ICD-10-CM

## 2020-03-15 DIAGNOSIS — G8929 Other chronic pain: Secondary | ICD-10-CM | POA: Diagnosis not present

## 2020-03-15 DIAGNOSIS — Z23 Encounter for immunization: Secondary | ICD-10-CM

## 2020-03-15 MED ORDER — ACETAMINOPHEN-CODEINE #3 300-30 MG PO TABS: 1.0000 | ORAL_TABLET | Freq: Three times a day (TID) | ORAL | 0 refills | Status: DC | PRN

## 2020-03-15 NOTE — Patient Instructions (Signed)
It was great seeing you today   I am going to refer you to physical therapy for low back pain - they will call you to schedule your appointment   I have also sent in a few tab of Tylenol 3 to take for severe pain.   Continue with the muscle creams and heating pad

## 2020-03-16 LAB — CBC WITH DIFFERENTIAL/PLATELET
Absolute Monocytes: 767 cells/uL (ref 200–950)
Basophils Absolute: 71 cells/uL (ref 0–200)
Basophils Relative: 1.2 %
Eosinophils Absolute: 142 cells/uL (ref 15–500)
Eosinophils Relative: 2.4 %
HCT: 43.9 % (ref 38.5–50.0)
Hemoglobin: 14.3 g/dL (ref 13.2–17.1)
Lymphs Abs: 1180 cells/uL (ref 850–3900)
MCH: 31.1 pg (ref 27.0–33.0)
MCHC: 32.6 g/dL (ref 32.0–36.0)
MCV: 95.4 fL (ref 80.0–100.0)
MPV: 10.8 fL (ref 7.5–12.5)
Monocytes Relative: 13 %
Neutro Abs: 3741 cells/uL (ref 1500–7800)
Neutrophils Relative %: 63.4 %
Platelets: 165 10*3/uL (ref 140–400)
RBC: 4.6 10*6/uL (ref 4.20–5.80)
RDW: 12.3 % (ref 11.0–15.0)
Total Lymphocyte: 20 %
WBC: 5.9 10*3/uL (ref 3.8–10.8)

## 2020-03-16 LAB — BASIC METABOLIC PANEL WITH GFR
BUN/Creatinine Ratio: 22 (calc) (ref 6–22)
BUN: 46 mg/dL — ABNORMAL HIGH (ref 7–25)
CO2: 24 mmol/L (ref 20–32)
Calcium: 9.4 mg/dL (ref 8.6–10.3)
Chloride: 107 mmol/L (ref 98–110)
Creat: 2.11 mg/dL — ABNORMAL HIGH (ref 0.70–1.11)
GFR, Est African American: 32 mL/min/{1.73_m2} — ABNORMAL LOW (ref >=60)
GFR, Est Non African American: 27 mL/min/{1.73_m2} — ABNORMAL LOW (ref >=60)
Glucose, Bld: 101 mg/dL — ABNORMAL HIGH (ref 65–99)
Potassium: 4 mmol/L (ref 3.5–5.3)
Sodium: 143 mmol/L (ref 135–146)

## 2020-03-20 ENCOUNTER — Ambulatory Visit: Payer: Medicare Other | Admitting: Internal Medicine

## 2020-03-20 ENCOUNTER — Encounter: Payer: Self-pay | Admitting: Internal Medicine

## 2020-03-20 ENCOUNTER — Other Ambulatory Visit: Payer: Self-pay

## 2020-03-20 VITALS — BP 171/85 | HR 78 | Wt 137.0 lb

## 2020-03-20 DIAGNOSIS — R7881 Bacteremia: Secondary | ICD-10-CM

## 2020-03-20 DIAGNOSIS — B952 Enterococcus as the cause of diseases classified elsewhere: Secondary | ICD-10-CM

## 2020-03-20 DIAGNOSIS — M549 Dorsalgia, unspecified: Secondary | ICD-10-CM | POA: Diagnosis not present

## 2020-03-20 DIAGNOSIS — T826XXD Infection and inflammatory reaction due to cardiac valve prosthesis, subsequent encounter: Secondary | ICD-10-CM

## 2020-03-20 DIAGNOSIS — I38 Endocarditis, valve unspecified: Secondary | ICD-10-CM

## 2020-03-20 MED ORDER — AMOXICILLIN 500 MG PO TABS
500.0000 mg | ORAL_TABLET | Freq: Two times a day (BID) | ORAL | 11 refills | Status: DC
Start: 2020-03-20 — End: 2020-03-27

## 2020-03-21 ENCOUNTER — Encounter: Payer: Self-pay | Admitting: Cardiology

## 2020-03-21 ENCOUNTER — Encounter: Payer: Self-pay | Admitting: Internal Medicine

## 2020-03-21 ENCOUNTER — Ambulatory Visit: Payer: Medicare Other | Admitting: Cardiology

## 2020-03-21 VITALS — BP 146/70 | HR 84 | Ht 67.0 in | Wt 136.2 lb

## 2020-03-21 DIAGNOSIS — T826XXD Infection and inflammatory reaction due to cardiac valve prosthesis, subsequent encounter: Secondary | ICD-10-CM | POA: Diagnosis not present

## 2020-03-21 DIAGNOSIS — I38 Endocarditis, valve unspecified: Secondary | ICD-10-CM | POA: Diagnosis not present

## 2020-03-21 NOTE — Patient Instructions (Signed)
Medication Instructions:   Your physician recommends that you continue on your current medications as directed. Please refer to the Current Medication list given to you today.  *If you need a refill on your cardiac medications before your next appointment, please call your pharmacy*  Testing/Procedures:  Your physician has requested that you have an echocardiogram. Echocardiography is a painless test that uses sound waves to create images of your heart. It provides your doctor with information about the size and shape of your heart and how well your heart's chambers and valves are working. This procedure takes approximately one hour. There are no restrictions for this procedure.  Follow-Up:  WITH DR. Meda Coffee IN THE OFFICE ON May 16, 2020 AT HER 2:00 PM SLOT--SCHEDULING PLEASE ADD PATIENT TO THIS DAY--OK TO ADD PER DR. Meda Coffee AND Rolin Schult

## 2020-03-21 NOTE — Assessment & Plan Note (Signed)
No new positive blood culture or new concerns.  He will continue with oral amoxicillin and follow up in 1 year.

## 2020-03-21 NOTE — Assessment & Plan Note (Signed)
No new positive blood cultures and no issues.

## 2020-03-21 NOTE — Progress Notes (Addendum)
Cardiology Office Progress Note   Evaluation Performed:  Follow-up visit, 4 months  Date:  03/21/2020   ID:  Ryan [name], DOB Jun 11, 1929, MRN [id number]  Patient Location: Home Provider Location: Home  PCP:  Dorothyann Peng, NP  Cardiologist:  Ena Dawley, MD  Electrophysiologist:  Dr Lovena Le  Reason for visit: Post hospitalization follow up  History of Present Illness:    Ryan [name] is a 86 y.o. 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   01/27/2018 -the patient was readmitted on January 15, 2018 for complaint of like of energy and chest pain with elevated troponin. Coronary CTA on August 16 showed widely patent mid RCA stent the remainder of anatomy and change.  04/20/2018 -patient was readmitted on October 28 with recurrent fevers, he has been started on IV ampicillin and Rocephin,  TEE did not show any recurrent aortic valve endocarditis.  He is blood cultures were negative.  He received PICC line to his right arm on April 01, 2018 and will receive IV penicillin with Rocephin for 6 weeks.  He is supposed to follow with ID service as well.  He was last seen in June 2021 when he was doing great, continued to swim and use stationary bike on a daily basis, he exercised 70 to 80 minutes a day, he gets tired when he walks.  He denies any palpitation dizziness or syncope.  No lower extremity edema.  No need to use Lasix.  No bleeding just some bruising.  The patient was hospitalized on March 04, 2020 after his outpatient  blood cultures obtained at the ID office turned positive for gram-positive cocci - Staph hominis.  He also noticed worsening of his chronic low back pain.  He had no leukocytosis, his CRP and procalcitonin where normal, he did not have fever chills, only one blood culture was positive.  His CT scan of L-spine did not show any progression of infection.  He was given IV antibiotics empirically, he was seen yesterday by Dr. Linus Salmons at ID and instructed to use amoxicillin for another year.  Today he states that he has no fever chills, he has decreased the amount of energy, he is using Tylenol 3 for lower back pain and is scheduled to start physical therapy.  He denies any lower extremity edema orthopnea or proximal nocturnal dyspnea.  The patient does not have symptoms concerning for COVID-19 infection (fever, chills, cough, or new shortness of breath).   Past Medical History:  Diagnosis Date  . Age-related macular degeneration   . Allergic rhinitis   . 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 Anemia 12/2017   normocytic.  iron deficient, ferritin 17 04/2018  . Arthritis    "hands, lower back" (04/30/2016)  . Asthma   . Bacterial endocarditis   . Carotid artery obstruction    a. s/p bilat CEA.  . Chronic combined systolic (congestive) and diastolic (congestive) heart failure ([place]) 03/18/2016  . Chronic lower back pain   . CKD (chronic kidney disease) stage 4, GFR 15-29 ml/min (HCC) 12/2017  . Claudication in peripheral vascular disease ([place]) 01/01/2018   Claudication  . Coronary  arteriosclerosis in native artery    a. 2017 Cardiac catheterization demonstrated worsening CAD with 70% mid LCx, 80% OM1 and 80% mid RCA stenosis. b.  In 11/17, he underwent successful rotational atherectomy and DES to RCA.  Marland Kitchen Decreased diffusion capacity 10/23/2017  . Diverticulitis of colon   . DJD (degenerative joint disease)   . Elevated TSH 06/30/2017  . GERD (gastroesophageal reflux disease)   . History of hiatal  hernia   . HLD (hyperlipidemia)   . Hypertension   . Hypertensive heart disease with heart failure ([place])   . Intractable back pain 11/07/2017  . OSA (obstructive sleep apnea)    intolerant to CPAP  . PAF (paroxysmal atrial fibrillation) ([place])   . Paroxysmal supraventricular tachycardia ([place])   . Presence of permanent cardiac pacemaker   . Primary malignant neoplasm of bladder ([place])   . Prostate cancer ([place])   . 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 S/P AVR 12/02/2017  . Syncope 10/23/2017  . Tachy-brady syndrome ([place])   . Thrombocytopenia ([place]) 11/07/2017  . Urinary retention 11/07/2017   Past Surgical History:  Procedure Laterality Date  . APPENDECTOMY    . CARDIAC CATHETERIZATION N/A 01/17/2016   Procedure: Right/Left Heart Cath and Coronary Angiography;  Surgeon: Belva Crome, MD;  Location: [facility];  Service: Cardiovascular;  Laterality: N/A;  . CARDIAC CATHETERIZATION N/A 04/30/2016   Procedure: Coronary/Graft Atherectomy;  Surgeon: Sherren Mocha, MD;  Location: [facility];  Service: Cardiovascular;  Laterality: N/A;  . CAROTID ENDARTERECTOMY Bilateral   . CATARACT EXTRACTION W/ INTRAOCULAR LENS  IMPLANT, BILATERAL Bilateral   . COLON SURGERY  1981   Meckles Diverticulum with volvulus  . COLONOSCOPY WITH PROPOFOL N/A 05/01/2018   Procedure: COLONOSCOPY WITH PROPOFOL;  Surgeon: Rush Landmark Telford Nab., MD;  Location: [facility];  Service: Gastroenterology;  Laterality: N/A;  . CORONARY ANGIOGRAPHY N/A 01/18/2018   Procedure: CORONARY ANGIOGRAPHY;  Surgeon: Lorretta Harp, MD;  Location: [facility];  Service: Cardiovascular;  Laterality: N/A;  . ENTEROSCOPY N/A 04/30/2018   Procedure: ENTEROSCOPY;  Surgeon: Rush Landmark Telford Nab., MD;  Location: [facility];  Service: Gastroenterology;  Laterality: N/A;  . EP IMPLANTABLE DEVICE N/A 03/21/2016   Procedure: Pacemaker Implant;  Surgeon: Evans Lance, MD;  Location:  [facility];  Service: Cardiovascular;  Laterality: N/A;  . EYE SURGERY    . INGUINAL HERNIA REPAIR Right 2009  . INSERT / REPLACE / REMOVE PACEMAKER    . INSERTION PROSTATE RADIATION SEED    . POLYPECTOMY  05/01/2018   Procedure: POLYPECTOMY;  Surgeon: Mansouraty, Telford Nab., MD;  Location: [facility];  Service: Gastroenterology;;  . TEE WITHOUT CARDIOVERSION N/A 06/10/2016   Procedure: TRANSESOPHAGEAL ECHOCARDIOGRAM (TEE);  Surgeon: Sherren Mocha, MD;  Location: [facility];  Service: Open Heart Surgery;  Laterality: N/A;  . TEE WITHOUT CARDIOVERSION N/A 11/10/2017   Procedure: TRANSESOPHAGEAL ECHOCARDIOGRAM (TEE);  Surgeon: Acie Fredrickson Wonda Cheng, MD;  Location: [facility] ENDOSCOPY;  Service: Cardiovascular;  Laterality: N/A;  . TEE WITHOUT CARDIOVERSION N/A 12/15/2017   Procedure: TRANSESOPHAGEAL ECHOCARDIOGRAM (TEE);  Surgeon: Buford Dresser, MD;  Location: [facility] ENDOSCOPY;  Service: Cardiovascular;  Laterality: N/A;  . TEE WITHOUT CARDIOVERSION N/A 03/30/2018   Procedure: TRANSESOPHAGEAL ECHOCARDIOGRAM (TEE);  Surgeon: Jerline Pain, MD;  Location: [facility] ENDOSCOPY;  Service: Cardiovascular;  Laterality: N/A;  . TONSILLECTOMY  ~ 1947  . TRANSCATHETER AORTIC VALVE REPLACEMENT, TRANSFEMORAL N/A 06/10/2016   Procedure: TRANSCATHETER AORTIC VALVE REPLACEMENT, TRANSFEMORAL;  Surgeon: Sherren Mocha, MD;  Location: [facility];  Service: Open Heart Surgery;  Laterality: N/A;    Current Meds  Medication Sig  . acetaminophen-codeine (TYLENOL #3) 300-30 MG tablet Take 1 tablet by mouth every 8 (eight) hours as needed for moderate pain.  Marland Kitchen albuterol (VENTOLIN HFA) 108 (90 Base) MCG/ACT inhaler INHALE 2 PUFFS INTO THE LUNGS EVERY 6 HOURS AS NEEDED FOR WHEEZING OR SHORTNESS OF BREATH  . amLODipine (NORVASC) 5 MG tablet TAKE 1 TABLET(5 MG) BY MOUTH DAILY  . amoxicillin (AMOXIL) 500 MG tablet Take 1 tablet (500 mg total) by mouth 2 (two) times daily.  Marland Kitchen atorvastatin (LIPITOR) 40 MG tablet TAKE 1 TABLET(40 MG) BY  MOUTH DAILY AT 6 PM  . cholecalciferol (VITAMIN D) 1000 units tablet Take 1,000 Units by mouth 2 (two) times a week. Monday and Thursday  . ELIQUIS 2.5 MG TABS tablet TAKE 1 TABLET(2.5 MG) BY MOUTH TWICE DAILY  . furosemide (LASIX) 20 MG tablet Take 1 tablet (20 mg total) by mouth daily as needed for fluid or edema.  . isosorbide mononitrate (IMDUR) 60 MG 24 hr tablet Take 1 tablet (60 mg total) by mouth daily.  Marland Kitchen latanoprost (XALATAN) 0.005 % ophthalmic solution Place 1 drop into both eyes at bedtime.  Marland Kitchen levothyroxine (SYNTHROID) 25 MCG tablet TAKE 1/2 TABLET BY MOUTH EVERY DAY BEFORE BREAKFAST  . metoprolol succinate (TOPROL-XL) 100 MG 24 hr tablet TAKE 1 TABLET BY MOUTH DAILY, TAKE WITH OR IMMEDIATELY FOLLOWING A MEAL  . Netarsudil Dimesylate (RHOPRESSA) 0.02 % SOLN Place 1 drop into both eyes at bedtime.  . nitroGLYCERIN (NITROSTAT) 0.4 MG SL tablet Place 1 tablet (0.4 mg total) under the tongue every 5 (five) minutes x 3 doses as needed for chest pain.  . pantoprazole (PROTONIX) 40 MG tablet Take 40 mg by mouth daily as needed (reflux).   . polyethylene glycol (MIRALAX / GLYCOLAX) 17 g packet Take 17 g by mouth daily as needed.  . vitamin B-12 (CYANOCOBALAMIN) 1000 MCG tablet Take 1,000 mcg by mouth daily.    Allergies:   Ciprofloxacin, Hydrochlorothiazide, Sulfa antibiotics, Ace inhibitors, Losartan, Carvedilol, Lisinopril, and Soy allergy   Social History   Tobacco Use  . Smoking status: Never Smoker  . Smokeless tobacco: Never Used  Vaping Use  . Vaping Use: Never used  Substance Use Topics  . Alcohol use: No    Alcohol/week: 0.0 standard drinks    Comment: 04/30/2016 "nothing in years"  . Drug use: No    Family Hx: The patient's family history includes Heart disease in his father and mother.  ROS:   Please see the history of present illness.    All other systems reviewed and are negative.  Prior CV studies:   The following studies were reviewed today:  Labs/Other Tests  and Data Reviewed:    EKG: Atrial pacing, 84 BPM, left bundle branch block, unchanged from prior.  Recent Labs: 07/26/2019: TSH 2.710 12/28/2019: NT-Pro BNP 326 03/06/2020: ALT 12; B Natriuretic Peptide 65.9; Magnesium 2.0 03/15/2020: BUN 46; Creat 2.11; Hemoglobin 14.3; Platelets 165; Potassium 4.0; Sodium 143   Recent Lipid Panel Lab Results  Component Value Date/Time   CHOL 126 07/26/2019 07:56 AM   TRIG 110 07/26/2019 07:56 AM   HDL 64 07/26/2019 07:56 AM   CHOLHDL 2.0 07/26/2019 07:56 AM   CHOLHDL 2 05/01/2017 08:27 AM   LDLCALC 42 07/26/2019 07:56 AM   Wt Readings from Last 3 Encounters:  03/21/20 136 lb 3.2 oz (61.8 kg)  03/20/20 137 lb (62.1 kg)  03/15/20 136 lb 1.6 oz (61.7 kg)    Objective:      Physical Exam:    VS:  BP (!) 146/70   Pulse 84   Ht 5\' 7"  (1.702 m)   Wt 136 lb 3.2 oz (61.8 kg)   SpO2 96%   BMI 21.33 kg/m     Wt Readings from Last 3 Encounters:  03/21/20 136 lb 3.2 oz (61.8 kg)  03/20/20 137 lb (62.1 kg)  03/15/20 136 lb 1.6 oz (61.7 kg)     GEN:  Well nourished, well developed in no acute distress HEENT: Normal NECK: No JVD; No carotid bruits LYMPHATICS: No lymphadenopathy CARDIAC: RRR, 3 out of 6 holosystolic murmurs, rubs, gallops RESPIRATORY:  Clear to auscultation without rales, wheezing or rhonchi  ABDOMEN: Soft, non-tender, non-distended MUSCULOSKELETAL:  No edema; No deformity  SKIN: Warm and dry NEUROLOGIC:  Alert and oriented x 3 PSYCHIATRIC:  Normal affect    ASSESSMENT & PLAN:    1.  CAD -last cath in August 2019 showed Prox Cx lesion is 70% stenosed. Ost 1st Mrg lesion is 75% stenosed. Previously placed Mid RCA stent (unknown type) is widely patent. Prox RCA lesion is 40% stenosed. Post Atrio lesion is 75% stenosed. Ost LAD to Prox LAD lesion is 30% stenosed. -The patient has no anginal symptoms.  He is on no aspirin as he is on Eliquis, continue high-dose of atorvastatin.  No side effects.  2. Bioprosthetic aortic  valve endocarditis -diagnosed on November 13, 2017, now new positive blood culture with Staph hominis however no fever chills, normal WBC, CRP and procalcitonin.  He has a prominent systolic murmur we will repeat his echocardiogram.  3.  Paroxysmal atrial fibrillation, he is maintaining sinus rhythm, on Eliquis, has no bleeding, on most recent pacemaker interrogation his A. fib burden is less than 0.1%.  Hemoglobin 14.3..  4.    Acute on chronic combined CHF with cardiomyopathy -he is well compensated.  5. Sick sinus syndrome, status post pacemaker placement, working normally, followed by Dr. Lovena Le.  6.  Chronic back pain -lumbar spine CT on March 04, 2020 showed no evidence of infection or other acute osseous abnormality in the lumbar spine. 2. Unchanged lumbar disc and facet degeneration. 3. 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 he is about to start physical therapy.  COVID-19 Education: The signs and symptoms of COVID-19 were discussed with the patient and how to seek care for testing (follow up with PCP or arrange E-visit).  The importance of social distancing was discussed today.  Time:   Today, I have spent 25 minutes with the patient with telehealth technology discussing the above problems.    Medication Adjustments/Labs and Tests Ordered: Current medicines are reviewed at length with the patient today.  Concerns regarding medicines are outlined above.   Tests Ordered: Orders Placed This Encounter  Procedures  . EKG 12-Lead  . ECHOCARDIOGRAM COMPLETE   Medication Changes: No orders of the defined types were placed in this encounter.  Disposition:  Follow up in 5months, obtain echocardiogram now This is a very complex patient with polymorbidity multiple medical issues and recent hospitalization, total time spent with the patient 40 minutes.  Signed, Ena Dawley, MD  03/21/2020 9:36 AM    [place]

## 2020-03-21 NOTE — Assessment & Plan Note (Signed)
This is a new problem and evaluated in the hospital.  No infectious concerns and he has follow up with PT.

## 2020-03-21 NOTE — Progress Notes (Signed)
   Subjective:    Patient ID: Ryan [name], male    DOB: 1930/01/27, 86 y.o.   MRN: [id number]  HPI Here for hsfu and follow up of prosthetic valve endocarditis. He was hospitalized recently with worsening low back pain and concern for new infection.  He had no fever or new concerns.  Blood culture was 1 bottle positive for Staph hominis (previous infection with Enterococcus) in one set and a repeat set remained negative.  He has otherwise continued to do well though the back pain is limiting his mobility more than previous.  He is starting PT for this.  He is here with his wife and has no new concerns.    Review of Systems  Constitutional: Negative for chills and fever.  Gastrointestinal: Negative for diarrhea.  Skin: Negative for rash.       Objective:   Physical Exam Cardiovascular:     Rate and Rhythm: Normal rate and regular rhythm.     Heart sounds: No murmur heard.   Pulmonary:     Effort: Pulmonary effort is normal.  Neurological:     General: No focal deficit present.     Mental Status: He is alert.  Psychiatric:        Mood and Affect: Mood normal.   SH: no tobacco        Assessment & Plan:

## 2020-03-27 ENCOUNTER — Other Ambulatory Visit: Payer: Self-pay | Admitting: Internal Medicine

## 2020-04-09 ENCOUNTER — Ambulatory Visit (INDEPENDENT_AMBULATORY_CARE_PROVIDER_SITE_OTHER): Payer: Medicare Other

## 2020-04-09 DIAGNOSIS — I495 Sick sinus syndrome: Secondary | ICD-10-CM | POA: Diagnosis not present

## 2020-04-09 LAB — CUP PACEART REMOTE DEVICE CHECK
Battery Impedance: 260 Ohm
Battery Remaining Longevity: 110 mo
Battery Voltage: 2.8 V
Brady Statistic AP VP Percent: 0 %
Brady Statistic AP VS Percent: 97 %
Brady Statistic AS VP Percent: 0 %
Brady Statistic AS VS Percent: 3 %
Date Time Interrogation Session: 20211108085847
Implantable Lead Implant Date: 20171020
Implantable Lead Implant Date: 20171020
Implantable Lead Location: 753859
Implantable Lead Location: 753860
Implantable Lead Model: 5076
Implantable Lead Model: 5076
Implantable Pulse Generator Implant Date: 20171020
Lead Channel Impedance Value: 466 Ohm
Lead Channel Impedance Value: 562 Ohm
Lead Channel Pacing Threshold Amplitude: 0.625 V
Lead Channel Pacing Threshold Amplitude: 0.625 V
Lead Channel Pacing Threshold Pulse Width: 0.4 ms
Lead Channel Pacing Threshold Pulse Width: 0.4 ms
Lead Channel Setting Pacing Amplitude: 2 V
Lead Channel Setting Pacing Amplitude: 2.5 V
Lead Channel Setting Pacing Pulse Width: 0.4 ms
Lead Channel Setting Sensing Sensitivity: 4 mV

## 2020-04-10 NOTE — Progress Notes (Signed)
Remote pacemaker transmission.   

## 2020-04-12 ENCOUNTER — Telehealth: Payer: Self-pay | Admitting: Cardiology

## 2020-04-12 ENCOUNTER — Other Ambulatory Visit: Payer: Self-pay

## 2020-04-12 ENCOUNTER — Ambulatory Visit (HOSPITAL_COMMUNITY): Payer: Medicare Other | Attending: Internal Medicine

## 2020-04-12 DIAGNOSIS — T826XXD Infection and inflammatory reaction due to cardiac valve prosthesis, subsequent encounter: Secondary | ICD-10-CM | POA: Diagnosis not present

## 2020-04-12 DIAGNOSIS — I38 Endocarditis, valve unspecified: Secondary | ICD-10-CM | POA: Insufficient documentation

## 2020-04-12 LAB — ECHOCARDIOGRAM COMPLETE
AR max vel: 1.25 cm2
AV Area VTI: 1.19 cm2
AV Area mean vel: 1.24 cm2
AV Mean grad: 13 mmHg
AV Peak grad: 29.6 mmHg
Ao pk vel: 2.72 m/s
Area-P 1/2: 2.41 cm2
S' Lateral: 2.8 cm

## 2020-04-12 NOTE — Telephone Encounter (Signed)
Patient's wife returning call for echo results.

## 2020-04-12 NOTE — Telephone Encounter (Signed)
ECHOCARDIOGRAM COMPLETE: Result Notes  Nuala Alpha, LPN  27/74/1287 8:67 PM EST     The patient and his wife have been notified of the result and verbalized understanding. All questions (if any) were answered. Nuala Alpha, LPN 67/20/9470 9:62 PM        Dorothy Spark, MD  04/12/2020 3:13 PM EST     Improved LVEF, now 55-60%, normally functioning aortic prosthesis, no vegetation

## 2020-04-16 ENCOUNTER — Other Ambulatory Visit: Payer: Self-pay

## 2020-04-16 ENCOUNTER — Ambulatory Visit: Payer: Medicare Other | Attending: Adult Health | Admitting: Physical Therapy

## 2020-04-16 ENCOUNTER — Encounter: Payer: Self-pay | Admitting: Physical Therapy

## 2020-04-16 DIAGNOSIS — G8929 Other chronic pain: Secondary | ICD-10-CM | POA: Diagnosis not present

## 2020-04-16 DIAGNOSIS — M545 Low back pain, unspecified: Secondary | ICD-10-CM | POA: Diagnosis not present

## 2020-04-16 DIAGNOSIS — M6281 Muscle weakness (generalized): Secondary | ICD-10-CM | POA: Diagnosis not present

## 2020-04-16 DIAGNOSIS — R2681 Unsteadiness on feet: Secondary | ICD-10-CM | POA: Diagnosis not present

## 2020-04-16 NOTE — Patient Instructions (Signed)
Access Code: [email or web address] URL: [email or web address] Date: 04/16/2020 Prepared by: Almyra Free  Exercises Supine Lower Trunk Rotation - 2 x daily - 7 x weekly - 1 sets - 5 reps - 10 sec hold Sit to Stand - 3 x daily - 7 x weekly - 1 sets - 5-10 reps Supine Piriformis Stretch with Leg Straight - 2 x daily - 7 x weekly - 1 sets - 3 reps - 30 sec hold Gastroc Stretch on Wall - 2 x daily - 7 x weekly - 1 sets - 3 reps - 30-60 sec hold  Patient Education Trigger Point Dry Needling

## 2020-04-16 NOTE — Therapy (Signed)
[facility] [address], [place], [place], [address] Phone: [phone]   Fax:  [phone]  Physical Therapy Evaluation  Patient Details  Name: Ryan [name] MRN: [id number] Date of Birth: 11/11/1929 Referring Provider (PT): Dorothyann Peng NP   Encounter Date: 04/16/2020   PT End of Session - 04/16/20 0847    Visit Number 1    Date for PT Re-Evaluation 06/11/20    Authorization Type BCBS MCR    Progress Note Due on Visit 10    PT Start Time 0847    PT Stop Time 0933    PT Time Calculation (min) 46 min    Activity Tolerance Patient tolerated treatment well    Behavior During Therapy [facility] for tasks assessed/performed           Past Medical History:  Diagnosis Date   Age-related macular degeneration    Allergic rhinitis    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  iron deficient, ferritin 17 04/2018   Arthritis    "hands, lower back" (04/30/2016)   Asthma    Bacterial endocarditis    Carotid artery obstruction    a. s/p bilat CEA.   Chronic combined systolic (congestive) and diastolic (congestive) heart failure (HCC) 03/18/2016   Chronic lower back pain    CKD (chronic kidney disease) stage 4, GFR 15-29 ml/min (HCC) 12/2017   Claudication in peripheral vascular disease ([employer]) 01/01/2018   Claudication   Coronary arteriosclerosis in native artery    a. 2017 Cardiac catheterization demonstrated worsening CAD with 70% mid LCx, 80% OM1 and 80% mid RCA stenosis. b.  In 11/17, he underwent successful rotational atherectomy and DES to RCA.   Decreased diffusion capacity 10/23/2017   Diverticulitis of colon    DJD (degenerative joint disease)    Elevated TSH 06/30/2017   GERD (gastroesophageal reflux disease)    History of hiatal hernia    HLD (hyperlipidemia)    Hypertension    Hypertensive heart disease with heart failure (HCC)    Intractable back pain  11/07/2017   OSA (obstructive sleep apnea)    intolerant to CPAP   PAF (paroxysmal atrial fibrillation) (HCC)    Paroxysmal supraventricular tachycardia (HCC)    Presence of permanent cardiac pacemaker    Primary malignant neoplasm of bladder ([place])    Prostate cancer ([place])    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   enterococcal bacteremia   S/P AVR 12/02/2017   Syncope 10/23/2017   Tachy-brady syndrome ([place])    Thrombocytopenia ([place]) 11/07/2017   Urinary retention 11/07/2017    Past Surgical History:  Procedure Laterality Date   APPENDECTOMY     CARDIAC CATHETERIZATION N/A 01/17/2016   Procedure: Right/Left Heart Cath and Coronary Angiography;  Surgeon: Belva Crome, MD;  Location: [facility];  Service: Cardiovascular;  Laterality: N/A;   CARDIAC CATHETERIZATION N/A 04/30/2016   Procedure: Coronary/Graft Atherectomy;  Surgeon: Sherren Mocha, MD;  Location: [facility];  Service: Cardiovascular;  Laterality: N/A;   CAROTID ENDARTERECTOMY Bilateral    CATARACT EXTRACTION W/ INTRAOCULAR LENS  IMPLANT, BILATERAL Bilateral    COLON SURGERY  1981   Meckles Diverticulum with volvulus   COLONOSCOPY WITH PROPOFOL N/A 05/01/2018   Procedure: COLONOSCOPY WITH PROPOFOL;  Surgeon: Rush Landmark Telford Nab., MD;  Location: [facility];  Service: Gastroenterology;  Laterality: N/A;   CORONARY ANGIOGRAPHY N/A 01/18/2018   Procedure: CORONARY ANGIOGRAPHY;  Surgeon: Lorretta Harp, MD;  Location: [facility]  INV[facility];  Service: Cardiovascular;  Laterality: N/A;   ENTEROSCOPY N/A 04/30/2018   Procedure: ENTEROSCOPY;  Surgeon: Rush Landmark Telford Nab., MD;  Location: [facility];  Service: Gastroenterology;  Laterality: N/A;   EP IMPLANTABLE DEVICE N/A 03/21/2016   Procedure: Pacemaker Implant;  Surgeon: Evans Lance, MD;  Location: [facility];  Service: Cardiovascular;  Laterality: N/A;   EYE SURGERY     INGUINAL HERNIA REPAIR Right 2009   INSERT /  REPLACE / REMOVE PACEMAKER     INSERTION PROSTATE RADIATION SEED     POLYPECTOMY  05/01/2018   Procedure: POLYPECTOMY;  Surgeon: Rush Landmark Telford Nab., MD;  Location: [facility];  Service: Gastroenterology;;   TEE WITHOUT CARDIOVERSION N/A 06/10/2016   Procedure: TRANSESOPHAGEAL ECHOCARDIOGRAM (TEE);  Surgeon: Sherren Mocha, MD;  Location: [facility];  Service: Open Heart Surgery;  Laterality: N/A;   TEE WITHOUT CARDIOVERSION N/A 11/10/2017   Procedure: TRANSESOPHAGEAL ECHOCARDIOGRAM (TEE);  Surgeon: Acie Fredrickson Wonda Cheng, MD;  Location: [facility] ENDOSCOPY;  Service: Cardiovascular;  Laterality: N/A;   TEE WITHOUT CARDIOVERSION N/A 12/15/2017   Procedure: TRANSESOPHAGEAL ECHOCARDIOGRAM (TEE);  Surgeon: Buford Dresser, MD;  Location: [facility] ENDOSCOPY;  Service: Cardiovascular;  Laterality: N/A;   TEE WITHOUT CARDIOVERSION N/A 03/30/2018   Procedure: TRANSESOPHAGEAL ECHOCARDIOGRAM (TEE);  Surgeon: Jerline Pain, MD;  Location: [facility] ENDOSCOPY;  Service: Cardiovascular;  Laterality: N/A;   TONSILLECTOMY  ~ 1947   TRANSCATHETER AORTIC VALVE REPLACEMENT, TRANSFEMORAL N/A 06/10/2016   Procedure: TRANSCATHETER AORTIC VALVE REPLACEMENT, TRANSFEMORAL;  Surgeon: Sherren Mocha, MD;  Location: [facility];  Service: Open Heart Surgery;  Laterality: N/A;    There were no vitals filed for this visit.    Subjective Assessment - 04/16/20 0850    Subjective In past 3-4 years has really slowed down. Patient was in the hospital for 4 days for blood issue; discharging 04/06/20. Feels weaker since being in the hospital. 27 yr h/o LBP but worsened in the past 10 days, but has been worsening for 6 months. Patient having pain in his back buttocks and post right thigh as soon as he gets out of bed. Reaching OH causes the same pain. Transitions to standing the worst.    Pertinent History Pacemaker, prostate CA 2016, vascular claudication, HOH, Rt knee pain (varus/wears brace), unsteadiness    How long can you sit comfortably? 15  min    How long can you stand comfortably? 30 min    How long can you walk comfortably? 15 min hurts with a cane, with walker no limit    Diagnostic tests CT: Severe disc narrowing L1 to 3 and L5/S1; chronic L5 pars defect with grade II anterolistheisis; sever bil foraminal stenosis    Patient Stated Goals to improve function and decrease pain    Currently in Pain? Yes    Pain Location Back    Pain Orientation Right    Pain Descriptors / Indicators Shooting    Pain Type Chronic pain    Pain Radiating Towards into right buttocks and post thigh    Pain Onset More than a month ago    Pain Frequency Intermittent    Aggravating Factors  supine to stand; sit to stand, reaching OH    Pain Relieving Factors change position              [facility] PT Assessment - 04/16/20 0001      Assessment   Medical Diagnosis chronic Rt sided LBP without sciatica    Referring Provider (PT) Dorothyann Peng NP  Onset Date/Surgical Date 10/01/19    Next MD Visit none scheduled    Prior Therapy yes for other things      Precautions   Precautions ICD/Pacemaker;Fall    Precaution Comments reports near falls      Restrictions   Weight Bearing Restrictions No      Balance Screen   Has the patient fallen in the past 6 months No   has come close; gets dizzy   Has the patient had a decrease in activity level because of a fear of falling?  Yes    Is the patient reluctant to leave their home because of a fear of falling?  No      Home Environment   Living Environment Private residence    Living Arrangements Spouse/significant other    Type of [place] Two level;Able to live on main level with bedroom/bathroom    Additional Comments 4 steps to back deck with bil railings spends a lot of time here      Prior Function   Level of Independence Independent with household mobility with device    Vocation Retired    Leisure walk 15 min per day with walker      Functional Tests   Functional  tests Sit to Stand      Sit to Stand   Comments 32 seconds      Posture/Postural Control   Posture/Postural Control Postural limitations    Postural Limitations Rounded Shoulders;Forward head;Flexed trunk;Weight shift left    Posture Comments even pelvis left shoulder depressed      ROM / Strength   AROM / PROM / Strength AROM;Strength      AROM   AROM Assessment Site Lumbar    Lumbar Flexion WFL    Lumbar Extension stands in forward flexion, no ext    Lumbar - Right Side Bend 25%    Lumbar - Left Side Bend 25%    Lumbar - Right Rotation 60%    Lumbar - Left Rotation 50%      Strength   Overall Strength Comments knees bil 5/5; ankle DF 5/5 bil    Strength Assessment Site Hip    Right/Left Hip Right;Left    Right Hip Flexion 4/5    Right Hip Extension 4+/5    Right Hip ABduction 4/5   painful   Left Hip Flexion 4+/5    Left Hip Extension 4+/5    Left Hip ABduction 4+/5      Flexibility   Soft Tissue Assessment /Muscle Length yes    Hamstrings WNL    Quadriceps bil gastroc/soleus tightness    Piriformis left tight    Quadratus Lumborum bil tightness      Palpation   Palpation comment right lumbar, piriformis      Transfers   Five time sit to stand comments  32 sec      Ambulation/Gait   Ambulation/Gait Yes    Ambulation/Gait Assistance 6: Modified independent (Device/Increase time)    Ambulation Distance (Feet) 30 Feet    Assistive device Straight cane    Gait Pattern Step-through pattern;Decreased stride length;Decreased dorsiflexion - left;Right flexed knee in stance;Left flexed knee in stance;Trunk flexed;Poor foot clearance - left    Ambulation Surface Level                      Objective measurements completed on examination: See above findings.  PT Education - 04/16/20 1340    Education Details HEP; basic DN ed    Person(s) Educated Patient    Methods Explanation;Demonstration;Handout    Comprehension Verbalized  understanding            PT Short Term Goals - 04/16/20 1353      PT SHORT TERM GOAL #1   Title Ind with initial HEP    Time 2    Period Weeks    Status New    Target Date 04/30/20      PT SHORT TERM GOAL #2   Title Complete BERG and set goals accordingly    Time 2    Period Weeks    Status New    Target Date 04/30/20      PT SHORT TERM GOAL #3   Title Decreased Pain in the low back by 25% or more with transitional movements.    Time 4    Period Weeks    Status New      PT SHORT TERM GOAL #4   Title decreased 5x sit to stand to 20 sec or less             PT Long Term Goals - 04/16/20 1354      PT LONG TERM GOAL #1   Title Patient able to perform transitional movements to standing with 50% less pain.    Time 8    Period Weeks    Status New    Target Date 06/11/20      PT LONG TERM GOAL #2   Title Patient able to reach [facility] with 50% less pain in the back.    Time 8    Period Weeks    Status New      PT LONG TERM GOAL #3   Title Pt to demonstrate increased LE strength by decreased 5x  stand to <= 15 sec.    Time 8    Period Weeks    Status New                  Plan - 04/16/20 0934    Clinical Impression Statement Patient presents with increased LBP x 6 months especially in past 2 weeks. He also reports general weaknes and unsteadiness with gait exacerbated by a recent stay in the hospital for observation. He amb with a SPC today but uses a walker when ambulating in community. He was previously very active (tennis, running) but has slowed considerably in the past 4 years. He has decreased lumbar ROM and significant degeneration in the lumbar spine. He has marked pain in the back and right hip with transitions to standing and with OH lifting in standing. His 5x sit to stand time of 32 sec puts him at significant fall risk and he reports some near falls recently. He uses a SPC or walker in the community and sometimes at home. He has tight ankle plantar  flexors and decreased heel strike on the left. He will benefit from PT to decrease his pain with ADLS and work on general strengthening to improve balance and overal function. He has a pool at home and worked out in this prior to cold weather. He may benefit from aquatic PT as well.    Personal Factors and Comorbidities Comorbidity 3+    Comorbidities Pacemaker, prostate CA 2016, vascular claudication, HOH, Rt knee pain (varus/wears brace), unsteadiness    Examination-Activity Limitations Stand;Reach Overhead;Transfers    Examination-Participation Restrictions Community Activity    Stability/Clinical Decision  Making Evolving/Moderate complexity    Clinical Decision Making Low    Rehab Potential Good    PT Frequency 2x / week    PT Duration 8 weeks    PT Treatment/Interventions ADLs/Self Care Home Management;Aquatic Therapy;Moist Heat;Therapeutic exercise;Gait training;Patient/family education;Manual techniques;Dry needling    PT Next Visit Plan FOTO, BERG, DN to right hip/lumbar; seated trunk rotation/diagonals; sit to stand; general LE, core strength; balance.    PT Home Exercise Plan [id number]    Consulted and Agree with Plan of Care Patient           Patient will benefit from skilled therapeutic intervention in order to improve the following deficits and impairments:  Abnormal gait, Decreased range of motion, Pain, Increased muscle spasms, Postural dysfunction, Decreased strength, Decreased balance, Impaired flexibility  Visit Diagnosis: Chronic bilateral low back pain, unspecified whether sciatica present - Plan: PT plan of care cert/re-cert  Unsteadiness on feet - Plan: PT plan of care cert/re-cert  Muscle weakness (generalized) - Plan: PT plan of care cert/re-cert     Problem List Patient Active Problem List   Diagnosis Date Noted   History of endocarditis    Bacteremia 03/04/2020   GIB (gastrointestinal bleeding) 04/28/2018   HLD (hyperlipidemia) 04/28/2018   Asthma  04/28/2018   Hypothyroidism 04/28/2018   Chronic diastolic CHF (congestive heart failure) ([place]) 04/28/2018   Symptomatic anemia 04/28/2018   Acute renal failure superimposed on stage 4 chronic kidney disease ([place]) 04/28/2018   Medication monitoring encounter 04/22/2018   Presence of permanent cardiac pacemaker 03/29/2018   Bacteremia due to Enterococcus 03/29/2018   Hypertensive urgency    Unstable angina ([place]) 01/15/2018   Claudication in peripheral vascular disease ([place]) 01/01/2018   S/P AVR 12/02/2017   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   Intractable back pain 11/07/2017   Elevated troponin 11/07/2017   Urinary retention 11/07/2017   Hypertension    Syncope 10/23/2017   Decreased diffusion capacity 10/23/2017   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   Elevated TSH 06/30/2017   Anticoagulation management encounter 08/19/2016   CAD (coronary artery disease), native coronary artery 04/30/2016   Tachy-brady syndrome ([place])    Hypertensive heart disease with heart failure ([place])    Pure hypercholesterolemia    PAF (paroxysmal atrial fibrillation) ([place]) 03/18/2016   Chronic combined systolic (congestive) and diastolic (congestive) heart failure ([place]) 03/18/2016   Atrial fibrillation with rapid ventricular response ([place]) 03/18/2016   DOE (dyspnea on exertion) 01/17/2016   Coronary artery disease of native artery of native heart with stable angina pectoris ([place])     Madelyn Flavors PT 04/16/2020, 1:59 PM  [facility] [address] [place], [place], [address] Phone: [phone]   Fax:  [phone]  Name: Ryan [name] MRN: [id number] Date of Birth: 1930/04/30

## 2020-04-18 ENCOUNTER — Ambulatory Visit: Payer: Medicare Other

## 2020-04-18 ENCOUNTER — Other Ambulatory Visit: Payer: Self-pay

## 2020-04-18 DIAGNOSIS — G8929 Other chronic pain: Secondary | ICD-10-CM | POA: Diagnosis not present

## 2020-04-18 DIAGNOSIS — M545 Low back pain, unspecified: Secondary | ICD-10-CM

## 2020-04-18 DIAGNOSIS — R2681 Unsteadiness on feet: Secondary | ICD-10-CM | POA: Diagnosis not present

## 2020-04-18 DIAGNOSIS — M6281 Muscle weakness (generalized): Secondary | ICD-10-CM

## 2020-04-18 NOTE — Therapy (Signed)
[facility] [address] [place], [place], [address] Phone: [phone]   Fax:  [phone]  Physical Therapy Treatment  Patient Details  Name: Ryan [name] MRN: [id number] Date of Birth: 03-06-1930 Referring Provider (PT): Dorothyann Peng NP   Encounter Date: 04/18/2020   PT End of Session - 04/18/20 1441    Visit Number 2    Date for PT Re-Evaluation 06/11/20    Authorization Type [employer] MCR    Progress Note Due on Visit 10    PT Start Time 9563    PT Stop Time 1441    PT Time Calculation (min) 42 min    Activity Tolerance Patient tolerated treatment well    Behavior During Therapy [facility] for tasks assessed/performed           Past Medical History:  Diagnosis Date  . Age-related macular degeneration   . Allergic rhinitis   . 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 Anemia 12/2017   normocytic.  iron deficient, ferritin 17 04/2018  . Arthritis    "hands, lower back" (04/30/2016)  . Asthma   . Bacterial endocarditis   . Carotid artery obstruction    a. s/p bilat CEA.  . Chronic combined systolic (congestive) and diastolic (congestive) heart failure ([place]) 03/18/2016  . Chronic lower back pain   . CKD (chronic kidney disease) stage 4, GFR 15-29 ml/min (HCC) 12/2017  . Claudication in peripheral vascular disease ([place]) 01/01/2018   Claudication  . Coronary arteriosclerosis in native artery    a. 2017 Cardiac catheterization demonstrated worsening CAD with 70% mid LCx, 80% OM1 and 80% mid RCA stenosis. b.  In 11/17, he underwent successful rotational atherectomy and DES to RCA.  Marland Kitchen Decreased diffusion capacity 10/23/2017  . Diverticulitis of colon   . DJD (degenerative joint disease)   . Elevated TSH 06/30/2017  . GERD (gastroesophageal reflux disease)   . History of hiatal hernia   . HLD (hyperlipidemia)   . Hypertension   . Hypertensive heart disease with heart failure ([place])   . Intractable back pain  11/07/2017  . OSA (obstructive sleep apnea)    intolerant to CPAP  . PAF (paroxysmal atrial fibrillation) ([place])   . Paroxysmal supraventricular tachycardia ([place])   . Presence of permanent cardiac pacemaker   . Primary malignant neoplasm of bladder ([place])   . Prostate cancer ([place])   . 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 S/P AVR 12/02/2017  . Syncope 10/23/2017  . Tachy-brady syndrome ([place])   . Thrombocytopenia ([place]) 11/07/2017  . Urinary retention 11/07/2017    Past Surgical History:  Procedure Laterality Date  . APPENDECTOMY    . CARDIAC CATHETERIZATION N/A 01/17/2016   Procedure: Right/Left Heart Cath and Coronary Angiography;  Surgeon: Belva Crome, MD;  Location: [place] [facility];  Service: Cardiovascular;  Laterality: N/A;  . CARDIAC CATHETERIZATION N/A 04/30/2016   Procedure: Coronary/Graft Atherectomy;  Surgeon: Sherren Mocha, MD;  Location: [place] [facility];  Service: Cardiovascular;  Laterality: N/A;  . CAROTID ENDARTERECTOMY Bilateral   . CATARACT EXTRACTION W/ INTRAOCULAR LENS  IMPLANT, BILATERAL Bilateral   . COLON SURGERY  1981   Meckles Diverticulum with volvulus  . COLONOSCOPY WITH PROPOFOL N/A 05/01/2018   Procedure: COLONOSCOPY WITH PROPOFOL;  Surgeon: Rush Landmark Telford Nab., MD;  Location: [place];  Service: Gastroenterology;  Laterality: N/A;  . CORONARY ANGIOGRAPHY N/A 01/18/2018   Procedure: CORONARY ANGIOGRAPHY;  Surgeon: Lorretta Harp, MD;  Location: [facility]  INVASIVE [facility] LAB;  Service: Cardiovascular;  Laterality: N/A;  . ENTEROSCOPY N/A 04/30/2018   Procedure: ENTEROSCOPY;  Surgeon: Rush Landmark Telford Nab., MD;  Location: [place];  Service: Gastroenterology;  Laterality: N/A;  . EP IMPLANTABLE DEVICE N/A 03/21/2016   Procedure: Pacemaker Implant;  Surgeon: Evans Lance, MD;  Location: [place] [facility];  Service: Cardiovascular;  Laterality: N/A;  . EYE SURGERY    . INGUINAL HERNIA REPAIR Right 2009  . INSERT /  REPLACE / REMOVE PACEMAKER    . INSERTION PROSTATE RADIATION SEED    . POLYPECTOMY  05/01/2018   Procedure: POLYPECTOMY;  Surgeon: Mansouraty, Telford Nab., MD;  Location: [facility];  Service: Gastroenterology;;  . TEE WITHOUT CARDIOVERSION N/A 06/10/2016   Procedure: TRANSESOPHAGEAL ECHOCARDIOGRAM (TEE);  Surgeon: Sherren Mocha, MD;  Location: [place];  Service: Open Heart Surgery;  Laterality: N/A;  . TEE WITHOUT CARDIOVERSION N/A 11/10/2017   Procedure: TRANSESOPHAGEAL ECHOCARDIOGRAM (TEE);  Surgeon: Acie Fredrickson Wonda Cheng, MD;  Location: [facility] ENDOSCOPY;  Service: Cardiovascular;  Laterality: N/A;  . TEE WITHOUT CARDIOVERSION N/A 12/15/2017   Procedure: TRANSESOPHAGEAL ECHOCARDIOGRAM (TEE);  Surgeon: Buford Dresser, MD;  Location: [facility] ENDOSCOPY;  Service: Cardiovascular;  Laterality: N/A;  . TEE WITHOUT CARDIOVERSION N/A 03/30/2018   Procedure: TRANSESOPHAGEAL ECHOCARDIOGRAM (TEE);  Surgeon: Jerline Pain, MD;  Location: [facility] ENDOSCOPY;  Service: Cardiovascular;  Laterality: N/A;  . TONSILLECTOMY  ~ 1947  . TRANSCATHETER AORTIC VALVE REPLACEMENT, TRANSFEMORAL N/A 06/10/2016   Procedure: TRANSCATHETER AORTIC VALVE REPLACEMENT, TRANSFEMORAL;  Surgeon: Sherren Mocha, MD;  Location: [place];  Service: Open Heart Surgery;  Laterality: N/A;    There were no vitals filed for this visit.   Subjective Assessment - 04/18/20 1359    Subjective I was really hurting after the evaluation and last visit.  I read about dry needling and I don't think I wasnt to try it right now.    Pertinent History Pacemaker, prostate CA 2016, vascular claudication, HOH, Rt knee pain (varus/wears brace), unsteadiness    Patient Stated Goals to improve function and decrease pain    Currently in Pain? Yes    Pain Score 5     Pain Location Back    Pain Orientation Right    Pain Descriptors / Indicators Shooting    Pain Type Chronic pain    Pain Onset More than a month ago    Pain Frequency Intermittent                              [facility] Adult PT Treatment/Exercise - 04/18/20 0001      Exercises   Exercises Knee/Hip;Lumbar      Lumbar Exercises: Stretches   Lower Trunk Rotation 3 reps;20 seconds    Lower Trunk Rotation Limitations painful to the Rt- PT advised to reduce thre range or to no do this to the Rt if too painful      Lumbar Exercises: Supine   Ab Set 10 reps    AB Set Limitations with ball squeezes      Knee/Hip Exercises: Stretches   Active Hamstring Stretch Both;2 reps;20 seconds    Piriformis Stretch 2 reps;30 seconds;Left;Right    Gastroc Stretch Both;1 rep;30 seconds    Gastroc Stretch Limitations verbal cues for hip alignment of back leg      Knee/Hip Exercises: Aerobic   Nustep Level 1x 8 minutes-PT present to discuss progress      Knee/Hip Exercises: Standing   Heel Raises  2 sets;10 reps      Knee/Hip Exercises: Seated   Long Arc Quad Strengthening;Both;10 reps    Marching Strengthening;Both;20 reps    Sit to [employer] 2 sets;5 reps   seated on black balance pad                   PT Short Term Goals - 04/16/20 1353      PT SHORT TERM GOAL #1   Title Ind with initial HEP    Time 2    Period Weeks    Status New    Target Date 04/30/20      PT SHORT TERM GOAL #2   Title Complete BERG and set goals accordingly    Time 2    Period Weeks    Status New    Target Date 04/30/20      PT SHORT TERM GOAL #3   Title Decreased Pain in the low back by 25% or more with transitional movements.    Time 4    Period Weeks    Status New      PT SHORT TERM GOAL #4   Title decreased 5x sit to stand to 20 sec or less             PT Long Term Goals - 04/16/20 1354      PT LONG TERM GOAL #1   Title Patient able to perform transitional movements to standing with 50% less pain.    Time 8    Period Weeks    Status New    Target Date 06/11/20      PT LONG TERM GOAL #2   Title Patient able to reach [facility] with 50% less pain in the back.    Time  8    Period Weeks    Status New      PT LONG TERM GOAL #3   Title Pt to demonstrate increased LE strength by decreased 5x  stand to <= 15 sec.    Time 8    Period Weeks    Status New                 Plan - 04/18/20 1421    Clinical Impression Statement Pt with first time follow-up after evaluation.  Pt reports that he had an increase in pain after starting exercises.  Pt does not want to try dry needling at this time. Session focused on gentle activity and balance exercise to improve safety and mobility. Pt was able to perform all exercises in the clinic without pain  Pt required minor verbal and tactile cues for technique.  Pt performed all exercise without significant increase in pain.  Pt will continue to benefit from skilled PT to address lumbar pain, gentle mobility, balance and core/hip strength.    PT Frequency 2x / week    PT Duration 8 weeks    PT Treatment/Interventions ADLs/Self Care Home Management;Aquatic Therapy;Moist Heat;Therapeutic exercise;Gait training;Patient/family education;Manual techniques;Dry needling    PT Next Visit Plan BERG if pt can tolerate (he was in pain today), trunk and hip flexibility, work on sit to stand, LE strength, core strength, balance    PT Home Exercise Plan [id number]    Recommended Other Services initial cert is signed    Consulted and Agree with Plan of Care Patient           Patient will benefit from skilled therapeutic intervention in order to improve the following deficits and impairments:  Abnormal gait, Decreased range  of motion, Pain, Increased muscle spasms, Postural dysfunction, Decreased strength, Decreased balance, Impaired flexibility  Visit Diagnosis: Chronic bilateral low back pain, unspecified whether sciatica present  Unsteadiness on feet  Muscle weakness (generalized)     Problem List Patient Active Problem List   Diagnosis Date Noted  . History of endocarditis   . Bacteremia 03/04/2020  . GIB  (gastrointestinal bleeding) 04/28/2018  . HLD (hyperlipidemia) 04/28/2018  . Asthma 04/28/2018  . Hypothyroidism 04/28/2018  . Chronic diastolic CHF (congestive heart failure) ([place]) 04/28/2018  . Symptomatic anemia 04/28/2018  . Acute renal failure superimposed on stage 4 chronic kidney disease ([place]) 04/28/2018  . Medication monitoring encounter 04/22/2018  . Presence of permanent cardiac pacemaker 03/29/2018  . Bacteremia due to Enterococcus 03/29/2018  . Hypertensive urgency   . Unstable angina ([place]) 01/15/2018  . Claudication in peripheral vascular disease ([place]) 01/01/2018  . S/P AVR 12/02/2017  . 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 Intractable back pain 11/07/2017  . Elevated troponin 11/07/2017  . Urinary retention 11/07/2017  . Hypertension   . Syncope 10/23/2017  . Decreased diffusion capacity 10/23/2017  . 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 Elevated TSH 06/30/2017  . Anticoagulation management encounter 08/19/2016  . CAD (coronary artery disease), native coronary artery 04/30/2016  . Tachy-brady syndrome ([place])   . Hypertensive heart disease with heart failure ([place])   . Pure hypercholesterolemia   . PAF (paroxysmal atrial fibrillation) ([place]) 03/18/2016  . Chronic combined systolic (congestive) and diastolic (congestive) heart failure ([place]) 03/18/2016  . Atrial fibrillation with rapid ventricular response ([place]) 03/18/2016  . DOE (dyspnea on exertion) 01/17/2016  . Coronary artery disease of native artery of native heart with stable angina pectoris [facility])      Sigurd Sos, PT 04/18/20 2:43 PM  [facility] [address], [place], [place], [address] Phone: [phone]   Fax:  [phone]  Name: Ryan [name] MRN: [id number] Date of Birth: [birth year]

## 2020-04-23 ENCOUNTER — Other Ambulatory Visit: Payer: Self-pay

## 2020-04-23 ENCOUNTER — Ambulatory Visit: Payer: Medicare Other | Admitting: Physical Therapy

## 2020-04-23 ENCOUNTER — Encounter: Payer: Self-pay | Admitting: Physical Therapy

## 2020-04-23 DIAGNOSIS — M545 Low back pain, unspecified: Secondary | ICD-10-CM | POA: Diagnosis not present

## 2020-04-23 DIAGNOSIS — M6281 Muscle weakness (generalized): Secondary | ICD-10-CM

## 2020-04-23 DIAGNOSIS — R2681 Unsteadiness on feet: Secondary | ICD-10-CM

## 2020-04-23 DIAGNOSIS — G8929 Other chronic pain: Secondary | ICD-10-CM

## 2020-04-23 NOTE — Therapy (Signed)
[facility] [address] [place], [place], [address] Phone: [phone]   Fax:  [phone]  Physical Therapy Treatment  Patient Details  Name: Ryan [name] MRN: [id number] Date of Birth: 12-02-1929 Referring Provider (PT): Dorothyann Peng NP   Encounter Date: 04/23/2020   PT End of Session - 04/23/20 0800    Visit Number 3    Date for PT Re-Evaluation 06/11/20    Authorization Type BCBS MCR    Progress Note Due on Visit 10    PT Start Time 0758    PT Stop Time 0846    PT Time Calculation (min) 48 min    Activity Tolerance Patient tolerated treatment well    Behavior During Therapy [facility] for tasks assessed/performed           Past Medical History:  Diagnosis Date  . Age-related macular degeneration   . Allergic rhinitis   . 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 Anemia 12/2017   normocytic.  iron deficient, ferritin 17 04/2018  . Arthritis    "hands, lower back" (04/30/2016)  . Asthma   . Bacterial endocarditis   . Carotid artery obstruction    a. s/p bilat CEA.  . Chronic combined systolic (congestive) and diastolic (congestive) heart failure ([place]) 03/18/2016  . Chronic lower back pain   . CKD (chronic kidney disease) stage 4, GFR 15-29 ml/min (HCC) 12/2017  . Claudication in peripheral vascular disease ([place]) 01/01/2018   Claudication  . Coronary arteriosclerosis in native artery    a. 2017 Cardiac catheterization demonstrated worsening CAD with 70% mid LCx, 80% OM1 and 80% mid RCA stenosis. b.  In 11/17, he underwent successful rotational atherectomy and DES to RCA.  Marland Kitchen Decreased diffusion capacity 10/23/2017  . Diverticulitis of colon   . DJD (degenerative joint disease)   . Elevated TSH 06/30/2017  . GERD (gastroesophageal reflux disease)   . History of hiatal hernia   . HLD (hyperlipidemia)   . Hypertension   . Hypertensive heart disease with heart failure ([place])   . Intractable back pain  11/07/2017  . OSA (obstructive sleep apnea)    intolerant to CPAP  . PAF (paroxysmal atrial fibrillation) ([place])   . Paroxysmal supraventricular tachycardia ([place])   . Presence of permanent cardiac pacemaker   . Primary malignant neoplasm of bladder ([place])   . Prostate cancer ([place])   . 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 S/P AVR 12/02/2017  . Syncope 10/23/2017  . Tachy-brady syndrome ([place])   . Thrombocytopenia ([place]) 11/07/2017  . Urinary retention 11/07/2017    Past Surgical History:  Procedure Laterality Date  . APPENDECTOMY    . CARDIAC CATHETERIZATION N/A 01/17/2016   Procedure: Right/Left Heart Cath and Coronary Angiography;  Surgeon: Belva Crome, MD;  Location: [place] [facility];  Service: Cardiovascular;  Laterality: N/A;  . CARDIAC CATHETERIZATION N/A 04/30/2016   Procedure: Coronary/Graft Atherectomy;  Surgeon: Sherren Mocha, MD;  Location: [place] [facility];  Service: Cardiovascular;  Laterality: N/A;  . CAROTID ENDARTERECTOMY Bilateral   . CATARACT EXTRACTION W/ INTRAOCULAR LENS  IMPLANT, BILATERAL Bilateral   . COLON SURGERY  1981   Meckles Diverticulum with volvulus  . COLONOSCOPY WITH PROPOFOL N/A 05/01/2018   Procedure: COLONOSCOPY WITH PROPOFOL;  Surgeon: Rush Landmark Telford Nab., MD;  Location: [place];  Service: Gastroenterology;  Laterality: N/A;  . CORONARY ANGIOGRAPHY N/A 01/18/2018   Procedure: CORONARY ANGIOGRAPHY;  Surgeon: Lorretta Harp, MD;  Location: [facility]  INVASIVE CV LAB;  Service: Cardiovascular;  Laterality: N/A;  . ENTEROSCOPY N/A 04/30/2018   Procedure: ENTEROSCOPY;  Surgeon: Rush Landmark Telford Nab., MD;  Location: [place];  Service: Gastroenterology;  Laterality: N/A;  . EP IMPLANTABLE DEVICE N/A 03/21/2016   Procedure: Pacemaker Implant;  Surgeon: Evans Lance, MD;  Location: [place] [facility];  Service: Cardiovascular;  Laterality: N/A;  . EYE SURGERY    . INGUINAL HERNIA REPAIR Right 2009  . INSERT /  REPLACE / REMOVE PACEMAKER    . INSERTION PROSTATE RADIATION SEED    . POLYPECTOMY  05/01/2018   Procedure: POLYPECTOMY;  Surgeon: Mansouraty, Telford Nab., MD;  Location: [place];  Service: Gastroenterology;;  . TEE WITHOUT CARDIOVERSION N/A 06/10/2016   Procedure: TRANSESOPHAGEAL ECHOCARDIOGRAM (TEE);  Surgeon: Sherren Mocha, MD;  Location: [place];  Service: Open Heart Surgery;  Laterality: N/A;  . TEE WITHOUT CARDIOVERSION N/A 11/10/2017   Procedure: TRANSESOPHAGEAL ECHOCARDIOGRAM (TEE);  Surgeon: Acie Fredrickson Wonda Cheng, MD;  Location: [facility] ENDOSCOPY;  Service: Cardiovascular;  Laterality: N/A;  . TEE WITHOUT CARDIOVERSION N/A 12/15/2017   Procedure: TRANSESOPHAGEAL ECHOCARDIOGRAM (TEE);  Surgeon: Buford Dresser, MD;  Location: [facility] ENDOSCOPY;  Service: Cardiovascular;  Laterality: N/A;  . TEE WITHOUT CARDIOVERSION N/A 03/30/2018   Procedure: TRANSESOPHAGEAL ECHOCARDIOGRAM (TEE);  Surgeon: Jerline Pain, MD;  Location: [facility] ENDOSCOPY;  Service: Cardiovascular;  Laterality: N/A;  . TONSILLECTOMY  ~ 1947  . TRANSCATHETER AORTIC VALVE REPLACEMENT, TRANSFEMORAL N/A 06/10/2016   Procedure: TRANSCATHETER AORTIC VALVE REPLACEMENT, TRANSFEMORAL;  Surgeon: Sherren Mocha, MD;  Location: [place];  Service: Open Heart Surgery;  Laterality: N/A;    There were no vitals filed for this visit.   Subjective Assessment - 04/23/20 0801    Subjective Patient reporting he is sore today. the pain in the back of the right leg and hip is constant. This is what I want to work on.    Pertinent History Pacemaker, prostate CA 2016, vascular claudication, HOH, Rt knee pain (varus/wears brace), unsteadiness    How long can you sit comfortably? 15 min    How long can you stand comfortably? 30 min    How long can you walk comfortably? 15 min hurts with a cane, with walker no limit    Diagnostic tests CT: Severe disc narrowing L1 to 3 and L5/S1; chronic L5 pars defect with grade II anterolistheisis; sever bil foraminal  stenosis    Patient Stated Goals to improve function and decrease pain    Currently in Pain? Yes    Pain Score 3     Pain Location Back    Pain Orientation Right    Pain Descriptors / Indicators Aching;Shooting    Pain Type Chronic pain              [facility] PT Assessment - 04/23/20 0001      Standardized Balance Assessment   Standardized Balance Assessment Berg Balance Test      Berg Balance Test   Sit to Stand Able to stand without using hands and stabilize independently    Standing Unsupported Able to stand safely 2 minutes    Sitting with Back Unsupported but Feet Supported on Floor or Stool Able to sit safely and securely 2 minutes    Stand to Sit Sits safely with minimal use of hands    Transfers Able to transfer safely, minor use of hands    Standing Unsupported with Eyes Closed Able to stand 10 seconds safely    Standing Unsupported with Feet Together Able  to place feet together independently and stand 1 minute safely    From Standing, Reach Forward with Outstretched Arm Can reach confidently >25 cm (10")    From Standing Position, Pick up Object from [place] to pick up shoe safely and easily    From Standing Position, Turn to Look Behind Over each Shoulder Looks behind from both sides and weight shifts well   pain in right thigh turning left   Turn 360 Degrees Needs close supervision or verbal cueing    Standing Unsupported, Alternately Place Feet on Step/Stool Able to stand independently and safely and complete 8 steps in 20 seconds    Standing Unsupported, One Foot in [employer] balance while stepping or standing    Standing on One Leg Tries to lift leg/unable to hold 3 seconds but remains standing independently    Total Score 46    Berg comment: some activities limited by LE pain                         [facility] Adult PT Treatment/Exercise - 04/23/20 0001      Lumbar Exercises: Stretches   Figure 4 Stretch 4 reps;60 seconds;Seated    Figure 4 Stretch  Limitations 2 bil      Lumbar Exercises: Aerobic   Nustep L2 x 8min      Lumbar Exercises: Seated   Sit to Stand 10 reps      Manual Therapy   Manual Therapy Soft tissue mobilization;Myofascial release;Manual Traction    Soft tissue mobilization STM and IASTM with [place] to right gluteals and piriformis; also along lateral and post thigh.    Myofascial Release TPR to Rt glute min/med and piriformis; ITB strumming    Manual Traction Rt long leg traction holding above knee due to right knee pain 2 x 20 sec                    PT Short Term Goals - 04/23/20 0859      PT SHORT TERM GOAL #1   Title Ind with initial HEP    Status Achieved      PT SHORT TERM GOAL #2   Title Complete BERG and set goals accordingly    Status Achieved             PT Long Term Goals - 04/23/20 0900      PT LONG TERM GOAL #4   Title Increase BERG score to >=50/56 to decrease fall risk.    Status New                 Plan - 04/23/20 0849    Clinical Impression Statement Patient continuing to have pain upon standing into right hip and post thigh. He responded very well to IASTM and TPR to right hip and post thigh. BERG score is 46/56 indicating significant fall risk. BERG assessment did aggravate LE sx especially turning left to look behind him. Patient may benefit from manual lumbar traction next visit and DN to gluteals.    Personal Factors and Comorbidities Comorbidity 3+    Comorbidities Pacemaker, prostate CA 2016, vascular claudication, HOH, Rt knee pain (varus/wears brace), unsteadiness    Examination-Activity Limitations Stand;Reach Overhead;Transfers    Examination-Participation Restrictions Community Activity    PT Frequency 2x / week    PT Duration 8 weeks    PT Treatment/Interventions ADLs/Self Care Home Management;Aquatic Therapy;Moist Heat;Therapeutic exercise;Gait training;Patient/family education;Manual techniques;Dry needling    PT Next  Visit Plan assess STGs;  continue with manual/IASTM to right gluteals; possibly DN; manual lumbar traction;  trunk and hip flexibility, work on sit to stand, LE strength, core strength, balance    PT Home Exercise Plan [email or web address]           Patient will benefit from skilled therapeutic intervention in order to improve the following deficits and impairments:  Abnormal gait, Decreased range of motion, Pain, Increased muscle spasms, Postural dysfunction, Decreased strength, Decreased balance, Impaired flexibility  Visit Diagnosis: Chronic bilateral low back pain, unspecified whether sciatica present  Unsteadiness on feet  Muscle weakness (generalized)     Problem List Patient Active Problem List   Diagnosis Date Noted  . History of endocarditis   . Bacteremia 03/04/2020  . GIB (gastrointestinal bleeding) 04/28/2018  . HLD (hyperlipidemia) 04/28/2018  . Asthma 04/28/2018  . Hypothyroidism 04/28/2018  . Chronic diastolic CHF (congestive heart failure) ([place]) 04/28/2018  . Symptomatic anemia 04/28/2018  . Acute renal failure superimposed on stage 4 chronic kidney disease ([place]) 04/28/2018  . Medication monitoring encounter 04/22/2018  . Presence of permanent cardiac pacemaker 03/29/2018  . Bacteremia due to Enterococcus 03/29/2018  . Hypertensive urgency   . Unstable angina ([place]) 01/15/2018  . Claudication in peripheral vascular disease ([place]) 01/01/2018  . S/P AVR 12/02/2017  . 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 Intractable back pain 11/07/2017  . Elevated troponin 11/07/2017  . Urinary retention 11/07/2017  . Hypertension   . Syncope 10/23/2017  . Decreased diffusion capacity 10/23/2017  . 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 Elevated TSH 06/30/2017  . Anticoagulation management encounter 08/19/2016  . CAD (coronary artery disease), native coronary artery 04/30/2016  . Tachy-brady syndrome ([place])   . Hypertensive heart disease with heart failure ([place])   . Pure  hypercholesterolemia   . PAF (paroxysmal atrial fibrillation) ([place]) 03/18/2016  . Chronic combined systolic (congestive) and diastolic (congestive) heart failure ([place]) 03/18/2016  . Atrial fibrillation with rapid ventricular response ([place]) 03/18/2016  . DOE (dyspnea on exertion) 01/17/2016  . Coronary artery disease of native artery of native heart with stable angina pectoris ([place])     Madelyn Flavors PT 04/23/2020, 9:03 AM  [facility] [address], [place], [place], [address] Phone: [phone]   Fax:  [phone]  Name: Ryan [name] MRN: [id number] Date of Birth: 1930/01/12

## 2020-04-30 ENCOUNTER — Ambulatory Visit: Payer: Medicare Other | Admitting: Physical Therapy

## 2020-04-30 ENCOUNTER — Other Ambulatory Visit: Payer: Self-pay

## 2020-04-30 ENCOUNTER — Encounter: Payer: Self-pay | Admitting: Physical Therapy

## 2020-04-30 DIAGNOSIS — R2681 Unsteadiness on feet: Secondary | ICD-10-CM | POA: Diagnosis not present

## 2020-04-30 DIAGNOSIS — M6281 Muscle weakness (generalized): Secondary | ICD-10-CM | POA: Diagnosis not present

## 2020-04-30 DIAGNOSIS — G8929 Other chronic pain: Secondary | ICD-10-CM | POA: Diagnosis not present

## 2020-04-30 DIAGNOSIS — M545 Low back pain, unspecified: Secondary | ICD-10-CM | POA: Diagnosis not present

## 2020-04-30 NOTE — Patient Instructions (Signed)
Access Code: [email or web address] URL: [email or web address] Date: 04/30/2020 Prepared by: Almyra Free  Exercises Supine Lower Trunk Rotation - 2 x daily - 7 x weekly - 1 sets - 5 reps - 10 sec hold Sit to Stand - 3 x daily - 7 x weekly - 1 sets - 5-10 reps Supine Piriformis Stretch with Leg Straight - 2 x daily - 7 x weekly - 1 sets - 3 reps - 30 sec hold Gastroc Stretch on Wall - 2 x daily - 7 x weekly - 1 sets - 3 reps - 30-60 sec hold Thoracic Sidebending with Towel Roll - 4 x daily - 7 x weekly - 1 sets - 1 reps - 5-10 min hold Supine Shoulder Flexion Extension Full Range AROM - 1 x daily - 7 x weekly - 2 sets - 10 reps  Patient Education Trigger Point Dry Needling

## 2020-04-30 NOTE — Therapy (Addendum)
[facility] [address], [place], [place], [address] Phone: [phone]   Fax:  [phone]  Physical Therapy Treatment  Patient Details  Name: Ryan [name] MRN: [id number] Date of Birth: 1929/09/12 Referring Provider (PT): Dorothyann Peng NP   Encounter Date: 04/30/2020   PT End of Session - 04/30/20 1016    Visit Number 4    Date for PT Re-Evaluation 06/11/20    Authorization Type BCBS MCR    Progress Note Due on Visit 10    PT Start Time 1016    PT Stop Time 1108    PT Time Calculation (min) 52 min    Activity Tolerance Patient tolerated treatment well    Behavior During Therapy [facility] for tasks assessed/performed           Past Medical History:  Diagnosis Date  . Age-related macular degeneration   . Allergic rhinitis   . 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 Anemia 12/2017   normocytic.  iron deficient, ferritin 17 04/2018  . Arthritis    "hands, lower back" (04/30/2016)  . Asthma   . Bacterial endocarditis   . Carotid artery obstruction    a. s/p bilat CEA.  . Chronic combined systolic (congestive) and diastolic (congestive) heart failure ([place]) 03/18/2016  . Chronic lower back pain   . CKD (chronic kidney disease) stage 4, GFR 15-29 ml/min (HCC) 12/2017  . Claudication in peripheral vascular disease ([place]) 01/01/2018   Claudication  . Coronary arteriosclerosis in native artery    a. 2017 Cardiac catheterization demonstrated worsening CAD with 70% mid LCx, 80% OM1 and 80% mid RCA stenosis. b.  In 11/17, he underwent successful rotational atherectomy and DES to RCA.  Marland Kitchen Decreased diffusion capacity 10/23/2017  . Diverticulitis of colon   . DJD (degenerative joint disease)   . Elevated TSH 06/30/2017  . GERD (gastroesophageal reflux disease)   . History of hiatal hernia   . HLD (hyperlipidemia)   . Hypertension   . Hypertensive heart disease with heart failure ([place])   . Intractable back pain  11/07/2017  . OSA (obstructive sleep apnea)    intolerant to CPAP  . PAF (paroxysmal atrial fibrillation) ([name])   . Paroxysmal supraventricular tachycardia ([place])   . Presence of permanent cardiac pacemaker   . Primary malignant neoplasm of bladder ([place])   . Prostate cancer ([place])   . 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 S/P AVR 12/02/2017  . Syncope 10/23/2017  . Tachy-brady syndrome ([place])   . Thrombocytopenia ([place]) 11/07/2017  . Urinary retention 11/07/2017    Past Surgical History:  Procedure Laterality Date  . APPENDECTOMY    . CARDIAC CATHETERIZATION N/A 01/17/2016   Procedure: Right/Left Heart Cath and Coronary Angiography;  Surgeon: Belva Crome, MD;  Location: [place] [facility];  Service: Cardiovascular;  Laterality: N/A;  . CARDIAC CATHETERIZATION N/A 04/30/2016   Procedure: Coronary/Graft Atherectomy;  Surgeon: Sherren Mocha, MD;  Location: [place] [facility];  Service: Cardiovascular;  Laterality: N/A;  . CAROTID ENDARTERECTOMY Bilateral   . CATARACT EXTRACTION W/ INTRAOCULAR LENS  IMPLANT, BILATERAL Bilateral   . COLON SURGERY  1981   Meckles Diverticulum with volvulus  . COLONOSCOPY WITH PROPOFOL N/A 05/01/2018   Procedure: COLONOSCOPY WITH PROPOFOL;  Surgeon: Rush Landmark Telford Nab., MD;  Location: [place];  Service: Gastroenterology;  Laterality: N/A;  . CORONARY ANGIOGRAPHY N/A 01/18/2018   Procedure: CORONARY ANGIOGRAPHY;  Surgeon: Lorretta Harp, MD;  Location: [facility]  INVASIVE CV LAB;  Service: Cardiovascular;  Laterality: N/A;  . ENTEROSCOPY N/A 04/30/2018   Procedure: ENTEROSCOPY;  Surgeon: Rush Landmark Telford Nab., MD;  Location: [place];  Service: Gastroenterology;  Laterality: N/A;  . EP IMPLANTABLE DEVICE N/A 03/21/2016   Procedure: Pacemaker Implant;  Surgeon: Evans Lance, MD;  Location: [place] [facility];  Service: Cardiovascular;  Laterality: N/A;  . EYE SURGERY    . INGUINAL HERNIA REPAIR Right 2009  . INSERT /  REPLACE / REMOVE PACEMAKER    . INSERTION PROSTATE RADIATION SEED    . POLYPECTOMY  05/01/2018   Procedure: POLYPECTOMY;  Surgeon: Mansouraty, Telford Nab., MD;  Location: [place];  Service: Gastroenterology;;  . TEE WITHOUT CARDIOVERSION N/A 06/10/2016   Procedure: TRANSESOPHAGEAL ECHOCARDIOGRAM (TEE);  Surgeon: Sherren Mocha, MD;  Location: [place];  Service: Open Heart Surgery;  Laterality: N/A;  . TEE WITHOUT CARDIOVERSION N/A 11/10/2017   Procedure: TRANSESOPHAGEAL ECHOCARDIOGRAM (TEE);  Surgeon: Acie Fredrickson Wonda Cheng, MD;  Location: [facility] ENDOSCOPY;  Service: Cardiovascular;  Laterality: N/A;  . TEE WITHOUT CARDIOVERSION N/A 12/15/2017   Procedure: TRANSESOPHAGEAL ECHOCARDIOGRAM (TEE);  Surgeon: Buford Dresser, MD;  Location: [facility] ENDOSCOPY;  Service: Cardiovascular;  Laterality: N/A;  . TEE WITHOUT CARDIOVERSION N/A 03/30/2018   Procedure: TRANSESOPHAGEAL ECHOCARDIOGRAM (TEE);  Surgeon: Jerline Pain, MD;  Location: [facility] ENDOSCOPY;  Service: Cardiovascular;  Laterality: N/A;  . TONSILLECTOMY  ~ 1947  . TRANSCATHETER AORTIC VALVE REPLACEMENT, TRANSFEMORAL N/A 06/10/2016   Procedure: TRANSCATHETER AORTIC VALVE REPLACEMENT, TRANSFEMORAL;  Surgeon: Sherren Mocha, MD;  Location: [place];  Service: Open Heart Surgery;  Laterality: N/A;    There were no vitals filed for this visit.   Subjective Assessment - 04/30/20 1019    Subjective Patient hurting more this week than he did last week. More in the back than hip today. He's spoke to his MD and he does not want him to do DN.    Pertinent History Pacemaker, prostate CA 2016, vascular claudication, HOH, Rt knee pain (varus/wears brace), unsteadiness    Diagnostic tests CT: Severe disc narrowing L1 to 3 and L5/S1; chronic L5 pars defect with grade II anterolistheisis; sever bil foraminal stenosis    Patient Stated Goals to improve function and decrease pain    Currently in Pain? Yes    Pain Score 4     Pain Location Back    Pain Orientation Lower    center   Pain Descriptors / Indicators Aching    Pain Type Chronic pain    Pain Radiating Towards into right buttocks and post thigh    Pain Onset More than a month ago                             [facility] Adult PT Treatment/Exercise - 04/30/20 0001      Transfers   Five time sit to stand comments  12.76 sec      Self-Care   Self-Care Other Self-Care Comments    Other Self-Care Comments  spinal education and traction education      Lumbar Exercises: Stretches   Other Lumbar Stretch Exercise traction at sink 2x 10 sec      Lumbar Exercises: Aerobic   Nustep L2 x 6 min      Lumbar Exercises: Standing   Other Standing Lumbar Exercises against wall with one arm OH flexion x 10 ea   pain in to right post thigh at end     Lumbar Exercises:  Seated   Sit to Stand 5 reps      Lumbar Exercises: Supine   Bent Knee Raise 10 reps    Bent Knee Raise Limitations then 5 with double knee raise    Other Supine Lumbar Exercises OH flexion x 10 ea arm; knees bent; no pain      Lumbar Exercises: Sidelying   Other Sidelying Lumbar Exercises over pillow to create traction x 6 min                   PT Education - 04/30/20 1515    Education Details HEP progressed; spine education    Person(s) Educated Patient    Methods Explanation;Demonstration;Handout    Comprehension Verbalized understanding;Returned demonstration            PT Short Term Goals - 04/30/20 1024      PT SHORT TERM GOAL #3   Title Decreased Pain in the low back by 25% or more with transitional movements.    Status On-going      PT SHORT TERM GOAL #4   Title decreased 5x sit to stand to 20 sec or less    Status Achieved             PT Long Term Goals - 04/30/20 1053      PT LONG TERM GOAL #1   Title Patient able to perform transitional movements to standing with 50% less pain.    Status On-going      PT LONG TERM GOAL #2   Title Patient able to reach [facility] with 50% less pain in the  back.    Status On-going      PT LONG TERM GOAL #3   Title Pt to demonstrate increased LE strength by decreased 5x  stand to <= 15 sec.    Baseline 12.76 sec on 04/30/20    Status Achieved                 Plan - 04/30/20 1515    Clinical Impression Statement Patient reporting increased LBP today and in the past week. Still hurting in the right hip/leg but a little better. Patient educated on lumbar traction and the spine in general and ways to decompress it. He responded well to left SDLY over one pillow x 51mn. Plan is to have him lie down every 2-3 hours for 5-10 min in this position to hopefully increased standing tolerance. He did not have leg pain today after lying until [facility]. He has met his sit to stand goals, both short and long term.    Personal Factors and Comorbidities Comorbidity 3+    Comorbidities Pacemaker, prostate CA 2016, vascular claudication, HOH, Rt knee pain (varus/wears brace), unsteadiness    PT Frequency 2x / week    PT Duration 8 weeks    PT Treatment/Interventions ADLs/Self Care Home Management;Aquatic Therapy;Moist Heat;Therapeutic exercise;Gait training;Patient/family education;Manual techniques;Dry needling    PT Next Visit Plan Assess SDLY traction; continue with manual/IASTM to right gluteals; trunk and hip flexibility, LE strength, core strength, balance    PT Home Exercise Plan [id number]          Patient will benefit from skilled therapeutic intervention in order to improve the following deficits and impairments:  Abnormal gait, Decreased range of motion, Pain, Increased muscle spasms, Postural dysfunction, Decreased strength, Decreased balance, Impaired flexibility  Visit Diagnosis: Chronic bilateral low back pain, unspecified whether sciatica present  Unsteadiness on feet  Muscle weakness (generalized)  Problem List Patient Active Problem List   Diagnosis Date Noted  . History of endocarditis   . Bacteremia  03/04/2020  . GIB (gastrointestinal bleeding) 04/28/2018  . HLD (hyperlipidemia) 04/28/2018  . Asthma 04/28/2018  . Hypothyroidism 04/28/2018  . Chronic diastolic CHF (congestive heart failure) ([place]) 04/28/2018  . Symptomatic anemia 04/28/2018  . Acute renal failure superimposed on stage 4 chronic kidney disease ([place]) 04/28/2018  . Medication monitoring encounter 04/22/2018  . Presence of permanent cardiac pacemaker 03/29/2018  . Bacteremia due to Enterococcus 03/29/2018  . Hypertensive urgency   . Unstable angina ([place]) 01/15/2018  . Claudication in peripheral vascular disease ([place]) 01/01/2018  . S/P AVR 12/02/2017  . 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 Intractable back pain 11/07/2017  . Elevated troponin 11/07/2017  . Urinary retention 11/07/2017  . Hypertension   . Syncope 10/23/2017  . Decreased diffusion capacity 10/23/2017  . 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 Elevated TSH 06/30/2017  . Anticoagulation management encounter 08/19/2016  . CAD (coronary artery disease), native coronary artery 04/30/2016  . Tachy-brady syndrome ([place])   . Hypertensive heart disease with heart failure ([place])   . Pure hypercholesterolemia   . PAF (paroxysmal atrial fibrillation) ([place]) 03/18/2016  . Chronic combined systolic (congestive) and diastolic (congestive) heart failure ([place]) 03/18/2016  . Atrial fibrillation with rapid ventricular response ([place]) 03/18/2016  . DOE (dyspnea on exertion) 01/17/2016  . Coronary artery disease of native artery of native heart with stable angina pectoris ([place])    Madelyn Flavors PT 04/30/2020, 3:27 PM   [facility] [address], [place], [place], [address] Phone: [phone]   Fax:  [phone]  Name: Ryan [name] MRN: [id number] Date of Birth: 09-06-1929  PHYSICAL THERAPY DISCHARGE SUMMARY  Visits from Start of Care: 4  Current functional level related to goals /  functional outcomes: See above   Remaining deficits: See above   Education / Equipment: HEP Plan: Patient agrees to discharge.  Patient goals were partially met. Patient is being discharged due to the patient's request.  ?????Patient phoned and reported that therapy exhausts him and he will continue to do the exercises that have been given to him.     Madelyn Flavors, PT 05/02/20 12:57 PM [facility] [address] [place], [place] [address] Phone # [phone] Fax [phone]

## 2020-05-11 ENCOUNTER — Encounter: Payer: Medicare Other | Admitting: Physical Therapy

## 2020-05-13 ENCOUNTER — Other Ambulatory Visit: Payer: Self-pay | Admitting: Cardiology

## 2020-05-16 ENCOUNTER — Encounter: Payer: Medicare Other | Admitting: Physical Therapy

## 2020-05-16 ENCOUNTER — Other Ambulatory Visit: Payer: Self-pay

## 2020-05-16 ENCOUNTER — Ambulatory Visit: Payer: Medicare Other | Admitting: Cardiology

## 2020-05-16 ENCOUNTER — Encounter: Payer: Self-pay | Admitting: Cardiology

## 2020-05-16 VITALS — BP 138/78 | HR 68 | Ht 67.0 in | Wt 136.8 lb

## 2020-05-16 DIAGNOSIS — I5043 Acute on chronic combined systolic (congestive) and diastolic (congestive) heart failure: Secondary | ICD-10-CM | POA: Diagnosis not present

## 2020-05-16 DIAGNOSIS — I2510 Atherosclerotic heart disease of native coronary artery without angina pectoris: Secondary | ICD-10-CM

## 2020-05-16 DIAGNOSIS — Z5181 Encounter for therapeutic drug level monitoring: Secondary | ICD-10-CM

## 2020-05-16 DIAGNOSIS — I495 Sick sinus syndrome: Secondary | ICD-10-CM

## 2020-05-16 DIAGNOSIS — I480 Paroxysmal atrial fibrillation: Secondary | ICD-10-CM | POA: Diagnosis not present

## 2020-05-16 DIAGNOSIS — Z7901 Long term (current) use of anticoagulants: Secondary | ICD-10-CM

## 2020-05-16 NOTE — Progress Notes (Signed)
Cardiology Office Progress Note   Evaluation Performed:  Follow-up visit, 4 months  Date:  05/16/2020   ID:  Ryan [name], DOB 1929/08/30, MRN [id number]  Patient Location: Home Provider Location: Home  PCP:  Dorothyann Peng, NP  Cardiologist:  Ena Dawley, MD  Electrophysiologist:  Dr Lovena Le  Reason for visit: Regular 3 months follow-up  History of Present Illness:    Ryan [name] is a 86 y.o. 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   01/27/2018 -the patient was readmitted on January 15, 2018 for complaint of like of energy and chest pain with elevated troponin. Coronary CTA on August 16 showed widely patent mid RCA stent the remainder of anatomy and change.  04/20/2018 -patient was readmitted on October 28 with recurrent fevers, he has been started on IV ampicillin and Rocephin,  TEE did not show any recurrent aortic valve endocarditis.  He is blood cultures were negative.  He received PICC line to his right arm on April 01, 2018 and will receive IV penicillin with Rocephin for 6 weeks.  He is supposed to follow with ID service as well.  He was last seen in June 2021 when he was doing great, continued to swim and use stationary bike on a daily basis, he exercised 70 to 80 minutes a day, he gets tired when he walks.  He denies any palpitation dizziness or syncope.  No lower extremity edema.  No need to use Lasix.  No bleeding just some bruising.  The patient was hospitalized on March 04, 2020 after his outpatient blood  cultures obtained at the ID office turned positive for gram-positive cocci - Staph hominis.  He also noticed worsening of his chronic low back pain.  He had no leukocytosis, his CRP and procalcitonin where normal, he did not have fever chills, only one blood culture was positive.  His CT scan of L-spine did not show any progression of infection.  He was given IV antibiotics empirically, he was seen yesterday by Dr. Linus Salmons at ID and instructed to use amoxicillin for another year.  Today he states that he has no fever chills, he has decreased the amount of energy, he is using Tylenol 3 for lower back pain and is scheduled to start physical therapy.  He denies any lower extremity edema orthopnea or proximal nocturnal dyspnea.  05/15/2020 -the patient is coming after 6 months, he has been doing really well, he denies any chest pain or shortness of breath, he has been compliant with his meds and has no side effects, no lower extremity edema orthopnea proximal nocturnal dyspnea no fevers.  His biggest complaint right now sciatic pain for which he underwent physical therapy with no improvement so far.  The patient does not have symptoms concerning for COVID-19 infection (fever, chills, cough, or new shortness of breath).   Past Medical History:  Diagnosis Date  . Age-related macular degeneration   . Allergic rhinitis   . 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 Anemia 12/2017   normocytic.  iron deficient, ferritin 17 04/2018  . Arthritis    "hands, lower back" (04/30/2016)  . Asthma   .  Bacterial endocarditis   . Carotid artery obstruction    a. s/p bilat CEA.  . Chronic combined systolic (congestive) and diastolic (congestive) heart failure ([place]) 03/18/2016  . Chronic lower back pain   . CKD (chronic kidney disease) stage 4, GFR 15-29 ml/min (HCC) 12/2017  . Claudication in peripheral vascular disease ([place]) 01/01/2018   Claudication  . Coronary arteriosclerosis in native artery    a. 2017 Cardiac  catheterization demonstrated worsening CAD with 70% mid LCx, 80% OM1 and 80% mid RCA stenosis. b.  In 11/17, he underwent successful rotational atherectomy and DES to RCA.  Marland Kitchen Decreased diffusion capacity 10/23/2017  . Diverticulitis of colon   . DJD (degenerative joint disease)   . Elevated TSH 06/30/2017  . GERD (gastroesophageal reflux disease)   . History of hiatal hernia   . HLD (hyperlipidemia)   . Hypertension   . Hypertensive heart disease with heart failure ([place])   . Intractable back pain 11/07/2017  . OSA (obstructive sleep apnea)    intolerant to CPAP  . PAF (paroxysmal atrial fibrillation) ([place])   . Paroxysmal supraventricular tachycardia ([place])   . Presence of permanent cardiac pacemaker   . Primary malignant neoplasm of bladder ([place])   . Prostate cancer ([place])   . 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 S/P AVR 12/02/2017  . Syncope 10/23/2017  . Tachy-brady syndrome ([place])   . Thrombocytopenia ([place]) 11/07/2017  . Urinary retention 11/07/2017   Past Surgical History:  Procedure Laterality Date  . APPENDECTOMY    . CARDIAC CATHETERIZATION N/A 01/17/2016   Procedure: Right/Left Heart Cath and Coronary Angiography;  Surgeon: Belva Crome, MD;  Location: [facility];  Service: Cardiovascular;  Laterality: N/A;  . CARDIAC CATHETERIZATION N/A 04/30/2016   Procedure: Coronary/Graft Atherectomy;  Surgeon: Sherren Mocha, MD;  Location: [facility];  Service: Cardiovascular;  Laterality: N/A;  . CAROTID ENDARTERECTOMY Bilateral   . CATARACT EXTRACTION W/ INTRAOCULAR LENS  IMPLANT, BILATERAL Bilateral   . COLON SURGERY  1981   Meckles Diverticulum with volvulus  . COLONOSCOPY WITH PROPOFOL N/A 05/01/2018   Procedure: COLONOSCOPY WITH PROPOFOL;  Surgeon: Rush Landmark Telford Nab., MD;  Location: [facility];  Service: Gastroenterology;  Laterality: N/A;  . CORONARY ANGIOGRAPHY N/A 01/18/2018   Procedure: CORONARY ANGIOGRAPHY;  Surgeon:  Lorretta Harp, MD;  Location: [facility];  Service: Cardiovascular;  Laterality: N/A;  . ENTEROSCOPY N/A 04/30/2018   Procedure: ENTEROSCOPY;  Surgeon: Rush Landmark Telford Nab., MD;  Location: [facility];  Service: Gastroenterology;  Laterality: N/A;  . EP IMPLANTABLE DEVICE N/A 03/21/2016   Procedure: Pacemaker Implant;  Surgeon: Evans Lance, MD;  Location: [facility];  Service: Cardiovascular;  Laterality: N/A;  . EYE SURGERY    . INGUINAL HERNIA REPAIR Right 2009  . INSERT / REPLACE / REMOVE PACEMAKER    . INSERTION PROSTATE RADIATION SEED    . POLYPECTOMY  05/01/2018   Procedure: POLYPECTOMY;  Surgeon: Mansouraty, Telford Nab., MD;  Location: [facility];  Service: Gastroenterology;;  . TEE WITHOUT CARDIOVERSION N/A 06/10/2016   Procedure: TRANSESOPHAGEAL ECHOCARDIOGRAM (TEE);  Surgeon: Sherren Mocha, MD;  Location: [place] [facility];  Service: Open Heart Surgery;  Laterality: N/A;  . TEE WITHOUT CARDIOVERSION N/A 11/10/2017   Procedure: TRANSESOPHAGEAL ECHOCARDIOGRAM (TEE);  Surgeon: Acie Fredrickson Wonda Cheng, MD;  Location: [facility] ENDOSCOPY;  Service: Cardiovascular;  Laterality: N/A;  . TEE WITHOUT CARDIOVERSION N/A 12/15/2017   Procedure: TRANSESOPHAGEAL ECHOCARDIOGRAM (TEE);  Surgeon: Buford Dresser, MD;  Location: [facility] ENDOSCOPY;  Service: Cardiovascular;  Laterality: N/A;  . TEE WITHOUT CARDIOVERSION N/A 03/30/2018   Procedure: TRANSESOPHAGEAL ECHOCARDIOGRAM (TEE);  Surgeon: Jerline Pain, MD;  Location[facility];  Service: Cardiovascular;  Laterality: N/A;  . TONSILLECTOMY  ~ 1947  . TRANSCATHETER AORTIC VALVE REPLACEMENT, TRANSFEMORAL N/A 06/10/2016   Procedure: TRANSCATHETER AORTIC VALVE REPLACEMENT, TRANSFEMORAL;  Surgeon: Sherren Mocha, MD;  Location: [place];  Service: Open Heart Surgery;  Laterality: N/A;    Current Meds  Medication Sig  . acetaminophen (TYLENOL) 500 MG tablet Take 500 mg by mouth daily.  Marland Kitchen acetaminophen-codeine (TYLENOL #3) 300-30 MG tablet Take 1  tablet by mouth every 8 (eight) hours as needed for moderate pain.  Marland Kitchen albuterol (VENTOLIN HFA) 108 (90 Base) MCG/ACT inhaler INHALE 2 PUFFS INTO THE LUNGS EVERY 6 HOURS AS NEEDED FOR WHEEZING OR SHORTNESS OF BREATH  . amLODipine (NORVASC) 5 MG tablet TAKE 1 TABLET(5 MG) BY MOUTH DAILY  . amoxicillin (AMOXIL) 500 MG tablet TAKE 1 TABLET(500 MG) BY MOUTH TWICE DAILY  . atorvastatin (LIPITOR) 40 MG tablet TAKE 1 TABLET(40 MG) BY MOUTH DAILY AT 6 PM  . cholecalciferol (VITAMIN D) 1000 units tablet Take 1,000 Units by mouth 2 (two) times a week. Monday and Thursday  . ELIQUIS 2.5 MG TABS tablet TAKE 1 TABLET(2.5 MG) BY MOUTH TWICE DAILY  . furosemide (LASIX) 20 MG tablet Take 1 tablet (20 mg total) by mouth daily as needed for fluid or edema.  . isosorbide mononitrate (IMDUR) 60 MG 24 hr tablet Take 1 tablet (60 mg total) by mouth daily.  Marland Kitchen latanoprost (XALATAN) 0.005 % ophthalmic solution Place 1 drop into both eyes at bedtime.  Marland Kitchen levothyroxine (SYNTHROID) 25 MCG tablet TAKE 1/2 TABLET BY MOUTH EVERY DAY BEFORE BREAKFAST  . metoprolol succinate (TOPROL-XL) 100 MG 24 hr tablet Take 100 mg by mouth daily. Take with or immediately following a meal.  . Netarsudil Dimesylate 0.02 % SOLN Place 1 drop into both eyes at bedtime.  . nitroGLYCERIN (NITROSTAT) 0.4 MG SL tablet Place 1 tablet (0.4 mg total) under the tongue every 5 (five) minutes x 3 doses as needed for chest pain.  . pantoprazole (PROTONIX) 40 MG tablet Take 40 mg by mouth daily as needed (reflux).   . polyethylene glycol (MIRALAX / GLYCOLAX) 17 g packet Take 17 g by mouth daily as needed.  . vitamin B-12 (CYANOCOBALAMIN) 1000 MCG tablet Take 1,000 mcg by mouth daily.    Allergies:   Ciprofloxacin, Hydrochlorothiazide, Sulfa antibiotics, Ace inhibitors, Losartan, Carvedilol, Lisinopril, and Soy allergy   Social History   Tobacco Use  . Smoking status: Never Smoker  . Smokeless tobacco: Never Used  Vaping Use  . Vaping Use: Never used   Substance Use Topics  . Alcohol use: No    Alcohol/week: 0.0 standard drinks    Comment: 04/30/2016 "nothing in years"  . Drug use: No    Family Hx: The patient's family history includes Heart disease in his father and mother.  ROS:   Please see the history of present illness.    All other systems reviewed and are negative.  Prior CV studies:   The following studies were reviewed today:  Labs/Other Tests and Data Reviewed:    EKG: Atrial pacing, 84 BPM, left bundle branch block, unchanged from prior.  Recent Labs: 07/26/2019: TSH 2.710 12/28/2019: NT-Pro BNP 326 03/06/2020: ALT 12; B Natriuretic Peptide 65.9; Magnesium 2.0 03/15/2020: BUN 46; Creat 2.11; Hemoglobin 14.3; Platelets 165; Potassium 4.0; Sodium 143  Recent Lipid Panel Lab Results  Component Value Date/Time   CHOL 126 07/26/2019 07:56 AM   TRIG 110 07/26/2019 07:56 AM   HDL 64 07/26/2019 07:56 AM   CHOLHDL 2.0 07/26/2019 07:56 AM   CHOLHDL 2 05/01/2017 08:27 AM   LDLCALC 42 07/26/2019 07:56 AM   Wt Readings from Last 3 Encounters:  05/16/20 136 lb 12.8 oz (62.1 kg)  03/21/20 136 lb 3.2 oz (61.8 kg)  03/20/20 137 lb (62.1 kg)    Objective:    Physical Exam:    VS:  BP (!) 146/70   Pulse 84   Ht 5\' 7"  (1.702 m)   Wt 136 lb 3.2 oz (61.8 kg)   SpO2 96%   BMI 21.33 kg/m     Wt Readings from Last 3 Encounters:  03/21/20 136 lb 3.2 oz (61.8 kg)  03/20/20 137 lb (62.1 kg)  03/15/20 136 lb 1.6 oz (61.7 kg)    GEN:  Well nourished, well developed in no acute distress HEENT: Normal NECK: No JVD; No carotid bruits LYMPHATICS: No lymphadenopathy CARDIAC: RRR, 3 out of 6 holosystolic murmurs, rubs, gallops RESPIRATORY:  Clear to auscultation without rales, wheezing or rhonchi  ABDOMEN: Soft, non-tender, non-distended MUSCULOSKELETAL:  No edema; No deformity  SKIN: Warm and dry NEUROLOGIC:  Alert and oriented x 3 PSYCHIATRIC:  Normal affect    ASSESSMENT & PLAN:    1.  CAD -last cath in August  2019 showed Prox Cx lesion is 70% stenosed. Ost 1st Mrg lesion is 75% stenosed. Previously placed Mid RCA stent (unknown type) is widely patent. Prox RCA lesion is 40% stenosed. Post Atrio lesion is 75% stenosed. Ost LAD to Prox LAD lesion is 30% stenosed. -The patient has no anginal symptoms despite high activity level.  He is on no aspirin as he is on Eliquis, continue high-dose of atorvastatin.  No side effects.  2. Bioprosthetic aortic valve endocarditis -diagnosed on November 13, 2017, now new positive blood culture with Staph hominis however no fever chills, normal WBC, CRP and procalcitonin.  He has no recurrent symptoms of fever or chills.  3.  Paroxysmal atrial fibrillation, he is maintaining sinus rhythm, o on Eliquis with no bleeding, most recent hemoglobin 14.3.  4. Chronic combined CHF with cardiomyopathy -he is well compensated.  Most recent BNP was normal, creatinine stable at 2.1.  5. Sick sinus syndrome, status post pacemaker placement, working normally, followed by Dr. Lovena Le.  COVID-19 Education: The signs and symptoms of COVID-19 were discussed with the patient and how to seek care for testing (follow up with PCP or arrange E-visit).  The importance of social distancing was discussed today.  Time:   Today, I have spent 25 minutes with the patient with telehealth technology discussing the above problems.    Medication Adjustments/Labs and Tests Ordered: Current medicines are reviewed at length with the patient today.  Concerns regarding medicines are outlined above.   Tests Ordered: No orders of the defined types were placed in this encounter.  Medication Changes: No orders of the defined types were placed in this encounter.  Disposition:  Follow up in 20months, obtain echocardiogram now This is a very complex patient with polymorbidity multiple medical issues and recent hospitalization, total time spent with the patient 40 minutes.  Signed, Ena Dawley, MD   05/16/2020 2:41 PM    [place]

## 2020-05-16 NOTE — Patient Instructions (Signed)
Medication Instructions:   Your physician recommends that you continue on your current medications as directed. Please refer to the Current Medication list given to you today.  *If you need a refill on your cardiac medications before your next appointment, please call your pharmacy*   Follow-Up:  WITH DR. Meda Coffee IN THE OFFICE FOR MAY 2022

## 2020-05-18 ENCOUNTER — Encounter: Payer: Medicare Other | Admitting: Physical Therapy

## 2020-05-23 ENCOUNTER — Encounter: Payer: Medicare Other | Admitting: Physical Therapy

## 2020-05-28 ENCOUNTER — Encounter: Payer: Medicare Other | Admitting: Physical Therapy

## 2020-05-29 DIAGNOSIS — Z85828 Personal history of other malignant neoplasm of skin: Secondary | ICD-10-CM | POA: Diagnosis not present

## 2020-05-29 DIAGNOSIS — D0461 Carcinoma in situ of skin of right upper limb, including shoulder: Secondary | ICD-10-CM | POA: Diagnosis not present

## 2020-05-29 DIAGNOSIS — L821 Other seborrheic keratosis: Secondary | ICD-10-CM | POA: Diagnosis not present

## 2020-05-29 DIAGNOSIS — L720 Epidermal cyst: Secondary | ICD-10-CM | POA: Diagnosis not present

## 2020-05-29 DIAGNOSIS — L570 Actinic keratosis: Secondary | ICD-10-CM | POA: Diagnosis not present

## 2020-05-30 ENCOUNTER — Encounter: Payer: Medicare Other | Admitting: Physical Therapy

## 2020-06-04 ENCOUNTER — Telehealth: Payer: Self-pay | Admitting: Adult Health

## 2020-06-04 NOTE — Telephone Encounter (Signed)
Left message for patient to call back and schedule Medicare Annual Wellness Visit (AWV) either virtually or in office.   Last AWV no information please schedule at anytime with [facility] Nurse Health Advisor 1 or 2   This should be a 45 minute visit. 

## 2020-06-06 ENCOUNTER — Encounter: Payer: Medicare Other | Admitting: Physical Therapy

## 2020-06-15 ENCOUNTER — Other Ambulatory Visit: Payer: Self-pay | Admitting: Cardiology

## 2020-06-15 DIAGNOSIS — I480 Paroxysmal atrial fibrillation: Secondary | ICD-10-CM

## 2020-06-18 NOTE — Telephone Encounter (Signed)
Prescription refill request for Eliquis received. Indication: a fib Last office visit: 05/16/20 Scr: 2.11 Age:  87 Weight: 62kg

## 2020-07-09 ENCOUNTER — Ambulatory Visit (INDEPENDENT_AMBULATORY_CARE_PROVIDER_SITE_OTHER): Payer: Medicare Other

## 2020-07-09 ENCOUNTER — Telehealth: Payer: Self-pay | Admitting: Cardiology

## 2020-07-09 DIAGNOSIS — I495 Sick sinus syndrome: Secondary | ICD-10-CM | POA: Diagnosis not present

## 2020-07-09 NOTE — Telephone Encounter (Signed)
Wife is calling to report that the pt has been bumping into things occasionally, and gets bruising and bleeding at times.  Wife states its mostly his hands and arms he bumps on objects, which in turn causes small bruises and sometimes bleeding.  Wife states he bumped is wrist this morning and it started bleeding more than usual.  Wife states the bleeding is very controlled, just occurs each time he bumps into something. Wife states should she hold his eliquis in the case of him bumping his extremities into things and experiencing bleeding.  Advised the pts wife that she should not hold his eliquis, unless otherwise advised, the pt has blood in his urine and stool, or he has a traumatic fall and hits his head, or the bleeding is uncontrollable.  Advised the pts wife that if those acute bleeds occur, that would seek immediate medical assistance first, then advisement would be provided on eliquis hold.  Advised the pts wife that being his bleeding is controlled, he should take his eliquis as prescribed. Informed the pts wife that I will route this message to Dr. Meda Coffee to further review and advise on this matter as well.  Informed the pts wife that if Dr. Meda Coffee has anything further to add, I will follow-up with them shortly thereafter.  Wife verbalized understanding and agrees with this plan.

## 2020-07-09 NOTE — Telephone Encounter (Signed)
Pt c/o medication issue:  1. Name of Medication: ELIQUIS 2.5 MG TABS tablet    2. How are you currently taking this medication (dosage and times per day)? As prescribed.  3. Are you having a reaction (difficulty breathing--STAT)? Yes.  4. What is your medication issue? Patients wife is calling stating that he is having bleeding from his right arm, she states that she thinks this type of bleed is due to the patient taking Eliquis. Please advise.

## 2020-07-09 NOTE — Telephone Encounter (Signed)
Continue anticoagulation for now, please call us if it gets worse if he notices blood in his stool or urine or nosebleeds.

## 2020-07-09 NOTE — Telephone Encounter (Signed)
Spoke with the pts wife and endorsed to her, recommendations per Dr. Meda Coffee, for the pt to continue his anticoagulation, and call us if it gets worse or they notice blood in his urine or stool, or frequent uncontrolled nosebleeds.  Wife verbalized understanding and agrees with this plan. Wife states the pt is resting comfortably, no active bleeding, and playing scrabble at this time.  Wife appreciative for all the assistance provided.

## 2020-07-10 LAB — CUP PACEART REMOTE DEVICE CHECK
Battery Impedance: 284 Ohm
Battery Remaining Longevity: 107 mo
Battery Voltage: 2.79 V
Brady Statistic AP VP Percent: 0 %
Brady Statistic AP VS Percent: 97 %
Brady Statistic AS VP Percent: 0 %
Brady Statistic AS VS Percent: 3 %
Date Time Interrogation Session: 20220208113059
Implantable Lead Implant Date: 20171020
Implantable Lead Implant Date: 20171020
Implantable Lead Location: 753859
Implantable Lead Location: 753860
Implantable Lead Model: 5076
Implantable Lead Model: 5076
Implantable Pulse Generator Implant Date: 20171020
Lead Channel Impedance Value: 447 Ohm
Lead Channel Impedance Value: 613 Ohm
Lead Channel Pacing Threshold Amplitude: 0.5 V
Lead Channel Pacing Threshold Amplitude: 0.75 V
Lead Channel Pacing Threshold Pulse Width: 0.4 ms
Lead Channel Pacing Threshold Pulse Width: 0.4 ms
Lead Channel Setting Pacing Amplitude: 2 V
Lead Channel Setting Pacing Amplitude: 2.5 V
Lead Channel Setting Pacing Pulse Width: 0.46 ms
Lead Channel Setting Sensing Sensitivity: 4 mV

## 2020-07-13 NOTE — Progress Notes (Signed)
Remote pacemaker transmission.   

## 2020-07-16 DIAGNOSIS — H401213 Low-tension glaucoma, right eye, severe stage: Secondary | ICD-10-CM | POA: Diagnosis not present

## 2020-07-16 DIAGNOSIS — H18003 Unspecified corneal deposit, bilateral: Secondary | ICD-10-CM | POA: Diagnosis not present

## 2020-07-16 DIAGNOSIS — H26493 Other secondary cataract, bilateral: Secondary | ICD-10-CM | POA: Diagnosis not present

## 2020-07-16 DIAGNOSIS — H401223 Low-tension glaucoma, left eye, severe stage: Secondary | ICD-10-CM | POA: Diagnosis not present

## 2020-07-23 ENCOUNTER — Other Ambulatory Visit: Payer: Self-pay | Admitting: Cardiology

## 2020-07-24 NOTE — Telephone Encounter (Signed)
Pt's pharmacy is requesting a refill on albuterol. Would Dr. Nelson like to refill this medication? Please address °

## 2020-07-24 NOTE — Telephone Encounter (Signed)
Yes, please refill

## 2020-07-30 ENCOUNTER — Other Ambulatory Visit: Payer: Self-pay | Admitting: Cardiology

## 2020-08-09 ENCOUNTER — Telehealth: Payer: Self-pay | Admitting: Cardiology

## 2020-08-09 NOTE — Telephone Encounter (Signed)
Spoke with the pt and wife and informed them both that per Dr. Meda Coffee, pt should avoid long hot showers and stay well hydrated.  Both verbalized understanding and agrees with this plan. Pt will be establishing care and seeing Dr. Sallyanne Kuster in April, as his new Cardiologist. Appt with him is 09/10/20, at our [place] office.

## 2020-08-09 NOTE — Telephone Encounter (Signed)
Avoid long hot showers, hydrate well

## 2020-08-09 NOTE — Telephone Encounter (Signed)
Pt c/o BP issue: STAT if pt c/o blurred vision, one-sided weakness or slurred speech  1. What are your last 5 BP readings?  105/61 pulse 60, 123/61 pulse 60, 103/61 pulse 60, 111/61 pulse 60  105/61 pulse  2. Are you having any other symptoms (ex. Dizziness, headache, blurred vision, passed out)? almost collasped a few minutes ago  3. What is your BP issue? Low blood pressure

## 2020-08-09 NOTE — Telephone Encounter (Signed)
Wife was calling to report to [place] that the pt had one pre-syncopal episode while taking a "very hot and long shower" this morning.  Wife states he reported to her that he took a longer shower this morning, and the temperature he had it set on was much hotter than he usually takes his shower in, but he wanted to do this for it was cooler this morning.   Wife states he finished his shower and came out and said he felt slightly lightheaded and pre-syncopal. Wife states she took his BP after that and it was normal at 105/61 HR-60, and also other recordings noted in this message.  Wife states after he cooled off, hydrated, and rested for a minute, his symptoms stopped and his BP came to 138/76 HR-60. Wife states during the brief episode he never had any other cardiac complaints.  He had no chest pain, sob, doe, palpitations, weakness, N/V, diaphoresis, or syncopal episode.  Wife states he's actually been doing very well, just this one episode.  Wife wanted to run this by Dr. Meda Coffee to ask if they should be concerned.  She states he feels great now, eating breakfast, with zero complaints.  Advised the pts wife that he should avoid prolong hot showers.  Advised her that the pt should stay plenty hydrated, and change positions slowly.  Advised her to continue to monitor and report any other new symptoms if he has them. Informed the pts wife that I will run this by Dr. Meda Coffee and follow-up with them accordingly thereafter.  Wife verbalized understanding and agrees with this plan. Wife was gracious for all the assistance provided.

## 2020-08-10 ENCOUNTER — Telehealth: Payer: Self-pay | Admitting: Adult Health

## 2020-08-10 NOTE — Progress Notes (Signed)
  Chronic Care Management   Outreach Note  08/10/2020 Name: KATHRYN LINAREZ MRN: [id number] DOB: 1930/02/12  Referred by: Dorothyann Peng, NP Reason for referral : No chief complaint on file.   An unsuccessful telephone outreach was attempted today. The patient was referred to the pharmacist for assistance with care management and care coordination.   Follow Up Plan:   Carley Perdue [employer] Scheduler

## 2020-08-22 ENCOUNTER — Telehealth: Payer: Self-pay | Admitting: Adult Health

## 2020-08-22 NOTE — Telephone Encounter (Signed)
Left message for patient to call back and schedule Medicare Annual Wellness Visit (AWV) either virtually or in office. No detailed message left   Last AWV no information  please schedule at anytime with [facility] Nurse Health Advisor 1 or 2   This should be a 45 minute visit.

## 2020-09-09 NOTE — Progress Notes (Signed)
Cardiology Office Note:    Date:  09/16/2020   ID:  Ryan [name], DOB [birth year], MRN [id number]  PCP:  Dorothyann Peng, NP   [place]  Cardiologist:  Sanda Klein, MD to [facility] Provider:  No care team member to display Electrophysiologist:  None       Referring MD: Dorothyann Peng, NP   Chief Complaint  Patient presents with  . Follow-up  Establish new Cardiology follow up  History of Present Illness:    Ryan [name] is a 87 y.o. male with a hx of carotid artery disease s/p bilateral CEA, CAD (s/p PTCA/DES to RCA 2017, also 70%OM1), aortic stenosis status post TAVR 2018 (Edwards 26 mm), enterococcal endocarditis 2019,  HTN, HLD, prior SVT, OSA, chronic combined CHF, cardiomyopathy with recovered LVEF,  SSS s/p PPM ([employer]onic Adapta DR, 2017 Lovena Le), paroxysmal atrial fibrillation on Eliquis, amiodarone induced lung toxicity, lumbar spondylosis, mild to moderate multilevel foraminal stenosis, pars defect L 4-5.   He had another episode of bacteremia with Staphylococcus hominis last October 2021, although without clear-cut clinical infectious syndrome, with normal CRP and very mildly elevated ESR, and is on a 1 year course of oral amoxicillin.  Follow-up blood cultures negative.  Overall doing well.  He exercises daily, 45 minutes on a reclining bicycle and then walks for about 22 minutes.  He is limited mostly by aching joints (mostly his back and his right knee).  He rarely develops dyspnea with activity.  He has not required nitroglycerin in months and he has not taking any diuretics (furosemide prescribed as needed for swelling) in several months either.  He was started on levothyroxine in a very low dose based on abnormal labs during his episode of endocarditis in 2019, but his most recent TSH was normal at 2.71 in February.  He denies angina at rest or with activity, palpitations, dizziness, syncope, focal neurological deficits,  falls or bleeding problems, claudication, lower extremity edema.  He is on chronic anticoagulation with Eliquis and has not missed any doses.  Past Medical History:  Diagnosis Date  . Age-related macular degeneration   . Allergic rhinitis   . 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 Anemia 12/2017   normocytic.  iron deficient, ferritin 17 04/2018  . Arthritis    "hands, lower back" (04/30/2016)  . Asthma   . Bacterial endocarditis   . Carotid artery obstruction    a. s/p bilat CEA.  . Chronic combined systolic (congestive) and diastolic (congestive) heart failure ([place]) 03/18/2016  . Chronic lower back pain   . CKD (chronic kidney disease) stage 4, GFR 15-29 ml/min (HCC) 12/2017  . Claudication in peripheral vascular disease ([place]) 01/01/2018   Claudication  . Coronary arteriosclerosis in native artery    a. 2017 Cardiac catheterization demonstrated worsening CAD with 70% mid LCx, 80% OM1 and 80% mid RCA stenosis. b.  In 11/17, he underwent successful rotational atherectomy and DES to RCA.  Marland Kitchen Decreased diffusion capacity 10/23/2017  . Diverticulitis of colon   . DJD (degenerative joint disease)   . Elevated TSH 06/30/2017  . GERD (gastroesophageal reflux disease)   . History of hiatal hernia   . HLD (hyperlipidemia)   . Hypertension   . Hypertensive heart disease with heart failure ([place])   . Intractable back pain 11/07/2017  . OSA (obstructive sleep apnea)    intolerant to CPAP  . PAF (paroxysmal atrial fibrillation) ([place])   . Paroxysmal supraventricular tachycardia ([place])   . Presence  of permanent cardiac pacemaker   . Primary malignant neoplasm of bladder ([place])   . Prostate cancer ([place])   . 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 S/P AVR 12/02/2017  . Syncope 10/23/2017  . Tachy-brady syndrome ([place])   . Thrombocytopenia ([place]) 11/07/2017  . Urinary retention 11/07/2017    Past Surgical History:  Procedure Laterality Date  . APPENDECTOMY     . CARDIAC CATHETERIZATION N/A 01/17/2016   Procedure: Right/Left Heart Cath and Coronary Angiography;  Surgeon: Belva Crome, MD;  Location: [facility];  Service: Cardiovascular;  Laterality: N/A;  . CARDIAC CATHETERIZATION N/A 04/30/2016   Procedure: Coronary/Graft Atherectomy;  Surgeon: Sherren Mocha, MD;  Location: [facility];  Service: Cardiovascular;  Laterality: N/A;  . CAROTID ENDARTERECTOMY Bilateral   . CATARACT EXTRACTION W/ INTRAOCULAR LENS  IMPLANT, BILATERAL Bilateral   . COLON SURGERY  1981   Meckles Diverticulum with volvulus  . COLONOSCOPY WITH PROPOFOL N/A 05/01/2018   Procedure: COLONOSCOPY WITH PROPOFOL;  Surgeon: Rush Landmark Telford Nab., MD;  Location: [facility];  Service: Gastroenterology;  Laterality: N/A;  . CORONARY ANGIOGRAPHY N/A 01/18/2018   Procedure: CORONARY ANGIOGRAPHY;  Surgeon: Lorretta Harp, MD;  Location: [facility];  Service: Cardiovascular;  Laterality: N/A;  . ENTEROSCOPY N/A 04/30/2018   Procedure: ENTEROSCOPY;  Surgeon: Rush Landmark Telford Nab., MD;  Location: [facility];  Service: Gastroenterology;  Laterality: N/A;  . EP IMPLANTABLE DEVICE N/A 03/21/2016   Procedure: Pacemaker Implant;  Surgeon: Evans Lance, MD;  Location: [facility];  Service: Cardiovascular;  Laterality: N/A;  . EYE SURGERY    . INGUINAL HERNIA REPAIR Right 2009  . INSERT / REPLACE / REMOVE PACEMAKER    . INSERTION PROSTATE RADIATION SEED    . POLYPECTOMY  05/01/2018   Procedure: POLYPECTOMY;  Surgeon: Mansouraty, Telford Nab., MD;  Location: [facility];  Service: Gastroenterology;;  . TEE WITHOUT CARDIOVERSION N/A 06/10/2016   Procedure: TRANSESOPHAGEAL ECHOCARDIOGRAM (TEE);  Surgeon: Sherren Mocha, MD;  Location: [facility];  Service: Open Heart Surgery;  Laterality: N/A;  . TEE WITHOUT CARDIOVERSION N/A 11/10/2017   Procedure: TRANSESOPHAGEAL ECHOCARDIOGRAM (TEE);  Surgeon: Acie Fredrickson Wonda Cheng, MD;  Location[facility];  Service:  Cardiovascular;  Laterality: N/A;  . TEE WITHOUT CARDIOVERSION N/A 12/15/2017   Procedure: TRANSESOPHAGEAL ECHOCARDIOGRAM (TEE);  Surgeon: Buford Dresser, MD;  Location[facility];  Service: Cardiovascular;  Laterality: N/A;  . TEE WITHOUT CARDIOVERSION N/A 03/30/2018   Procedure: TRANSESOPHAGEAL ECHOCARDIOGRAM (TEE);  Surgeon: Jerline Pain, MD;  Location: [facility] END[facility]COPY;  Service: Cardiovascular;  Laterality: N/A;  . TONSILLECTOMY  ~ 1947  . TRANSCATHETER AORTIC VALVE REPLACEMENT, TRANSFEMORAL N/A 06/10/2016   Procedure: TRANSCATHETER AORTIC VALVE REPLACEMENT, TRANSFEMORAL;  Surgeon: Sherren Mocha, MD;  Location: [place];  Service: Open Heart Surgery;  Laterality: N/A;    Current Medications: Current Meds  Medication Sig  . acetaminophen (TYLENOL) 500 MG tablet Take 500 mg by mouth daily.  Marland Kitchen acetaminophen-codeine (TYLENOL #3) 300-30 MG tablet Take 1 tablet by mouth every 8 (eight) hours as needed for moderate pain.  Marland Kitchen albuterol (VENTOLIN HFA) 108 (90 Base) MCG/ACT inhaler INHALE 2 PUFFS INTO THE LUNGS EVERY 6 HOURS AS NEEDED FOR WHEEZING OR SHORTNESS OF BREATH  . amLODipine (NORVASC) 5 MG tablet TAKE 1 TABLET(5 MG) BY MOUTH DAILY  . amoxicillin (AMOXIL) 500 MG tablet TAKE 1 TABLET(500 MG) BY MOUTH TWICE DAILY  . atorvastatin (LIPITOR) 40 MG tablet TAKE 1 TABLET(40 MG) BY MOUTH  DAILY AT 6 PM  . cholecalciferol (VITAMIN D) 1000 units tablet Take 1,000 Units by mouth 2 (two) times a week. Monday and Thursday  . latanoprost (XALATAN) 0.005 % ophthalmic solution Place 1 drop into both eyes at bedtime.  . nitroGLYCERIN (NITROSTAT) 0.4 MG SL tablet Place 1 tablet (0.4 mg total) under the tongue every 5 (five) minutes x 3 doses as needed for chest pain.  . vitamin B-12 (CYANOCOBALAMIN) 1000 MCG tablet Take 1,000 mcg by mouth daily.  . [DISCONTINUED] ELIQUIS 2.5 MG TABS tablet TAKE 1 TABLET(2.5 MG) BY MOUTH TWICE DAILY  . [DISCONTINUED] furosemide (LASIX) 20 MG tablet Take 1 tablet (20 mg  total) by mouth daily as needed for fluid or edema.  . [DISCONTINUED] isosorbide mononitrate (IMDUR) 60 MG 24 hr tablet Take 1 tablet (60 mg total) by mouth daily.  . [DISCONTINUED] levothyroxine (SYNTHROID) 25 MCG tablet TAKE 1/2 TABLET BY MOUTH EVERY DAY BEFORE BREAKFAST  . [DISCONTINUED] metoprolol succinate (TOPROL-XL) 100 MG 24 hr tablet Take 100 mg by mouth daily. Take with or immediately following a meal.  . [DISCONTINUED] Netarsudil Dimesylate 0.02 % SOLN Place 1 drop into both eyes at bedtime.  . [DISCONTINUED] polyethylene glycol (MIRALAX / GLYCOLAX) 17 g packet Take 17 g by mouth daily as needed.     Allergies:   Ciprofloxacin, Hydrochlorothiazide, Sulfa antibiotics, Ace inhibitors, Losartan, Carvedilol, Lisinopril, and Soy allergy   Social History   Socioeconomic History  . Marital status: Married    Spouse name: Not on file  . Number of children: Not on file  . Years of education: Not on file  . Highest education level: Not on file  Occupational History  . Occupation: retired  Tobacco Use  . Smoking status: Never Smoker  . Smokeless tobacco: Never Used  Vaping Use  . Vaping Use: Never used  Substance and Sexual Activity  . Alcohol use: No    Alcohol/week: 0.0 standard drinks    Comment: 04/30/2016 "nothing in years"  . Drug use: No  . Sexual activity: Never  Other Topics Concern  . Not on file  Social History Narrative   Retired    Three children - One in [place], One in [place], and one in [place].       Social Determinants of Health   Financial Resource Strain: Not on file  Food Insecurity: Not on file  Transportation Needs: Not on file  Physical Activity: Not on file  Stress: Not on file  Social Connections: Not on file     Family History: The patient's family history includes Heart disease in his father and mother.  ROS:   Please see the history of present illness.     All other systems reviewed and are negative.  EKGs/Labs/Other Studies Reviewed:     The following studies were reviewed today: Remote PM download 07/10/2020: Normal device function.  Estimated generator longevity over 12 years.  97% atrial paced no ventricular pacing, no major arrhythmic events  Echo 04/12/2020: 1. Left ventricular ejection fraction, by estimation, is 55 to 60%. The  left ventricle has normal function. The left ventricle has no regional  wall motion abnormalities. There is mild left ventricular hypertrophy.  Left ventricular diastolic parameters  are consistent with Grade I diastolic dysfunction (impaired relaxation).  2. Right ventricular systolic function is normal. The right ventricular  size is normal. There is mildly elevated pulmonary artery systolic  pressure. The estimated right ventricular systolic pressure is 74.2 mmHg.  3. Left atrial size was mildly  dilated.  4. A small pericardial effusion is present. The pericardial effusion is  posterior to the left ventricle.  5. The mitral valve is normal in structure. Trivial mitral valve  regurgitation. No evidence of mitral stenosis.  6. The aortic valve has been repaired/replaced. Aortic valve  regurgitation is trivial. No aortic stenosis is present. There is a 26 mm  Sapien prosthetic (TAVR) valve present in the aortic position (06/2016).  Aortic valve mean gradient measures 13.0 mmHg.  Grossly stable appearance of prosthetic aortic valve. No valvular  vegetations.  7. The inferior vena cava is normal in size with greater than 50%  respiratory variability, suggesting right atrial pressure of 3 mmHg.   EKG:  EKG is ordered today.  The ekg ordered today demonstrates atrial paced, ventricular sensed rhythm with left bundle branch block, QRS is 120 ms, QTC 447  Recent Labs: 12/28/2019: NT-Pro BNP 326 03/06/2020: ALT 12; B Natriuretic Peptide 65.9; Magnesium 2.0 03/15/2020: BUN 46; Creat 2.11; Hemoglobin 14.3; Platelets 165; Potassium 4.0; Sodium 143  Recent Lipid Panel    Component Value  Date/Time   CHOL 126 07/26/2019 0756   TRIG 110 07/26/2019 0756   HDL 64 07/26/2019 0756   CHOLHDL 2.0 07/26/2019 0756   CHOLHDL 2 05/01/2017 0827   VLDL 14.0 05/01/2017 0827   LDLCALC 42 07/26/2019 0756     Risk Assessment/Calculations:    CHA2DS2-VASc Score = 5  This indicates a 7.2% annual risk of stroke. The patient's score is based upon: CHF History: Yes HTN History: Yes Diabetes History: No Stroke History: No Vascular Disease History: Yes Age Score: 2 Gender Score: 0      Physical Exam:    VS:  BP 132/68   Pulse 69   Ht _0  (1.702 m)   Wt 136 lb 9.6 oz (62 kg)   BMI 21.39 kg/m     Wt Readings from Last 3 Encounters:  09/10/20 136 lb 9.6 oz (62 kg)  05/16/20 136 lb 12.8 oz (62.1 kg)  03/21/20 136 lb 3.2 oz (61.8 kg)     GEN: Appears well, well nourished, well developed in no acute distress HEENT: Normal NECK: No JVD; No carotid bruits LYMPHATICS: No lymphadenopathy CARDIAC: RRR, no diastolic murmurs, rubs, gallops RESPIRATORY:  Clear to auscultation without rales, wheezing or rhonchi  ABDOMEN: Soft, non-tender, non-distended MUSCULOSKELETAL:  No edema; No deformity  SKIN: Warm and dry NEUROLOGIC:  Alert and oriented x 3 PSYCHIATRIC:  Normal affect   ASSESSMENT:    1. Chronic diastolic CHF (congestive heart failure) ([place])   2. Coronary artery disease involving native coronary artery of native heart without angina pectoris   3. History of transcatheter aortic valve replacement (TAVR)   4. History of endocarditis   5. SSS (sick sinus syndrome) (HCC)   6. Cardiac pacemaker in situ   7. PAF (paroxysmal atrial fibrillation) ([place])   8. LBBB (left bundle branch block)   9. Long term (current) use of anticoagulants   10. Status post bilateral carotid endarterectomy   11. Essential hypertension   12. Pure hypercholesterolemia   13. CKD (chronic kidney disease) stage 4, GFR 15-29 ml/min (HCC)   14. Near syncope   15. Lumbar spondylolysis    PLAN:     In order of problems listed above:  1. CHF: recovered EF (35-40% in 2019, normal by echo 04/2020).  Clinically euvolemic without using diuretics in many months.  NYHA functional class 1-2. 2. CAD: s/p DES-RCA pre TAVR. Known 70% OM1 stenosis and  mild LAD stenosis.  Does not have angina pectoris even though he exercises daily.  Will reduce his isosorbide mononitrate to just 30 mg daily. 3. TAVR:  Edwards 26 mm, 2018. Stable gradients and no insufficiency by echo in November 2021.  4. History of enterococcal endocarditis: managed medically 2019 (+ve TAVR vegetation on TEE, tiny fibers on PM lead) and [place] bacteremia October 2021 (-ve vegetations on TAVR, unchanged fibers on PM lead on TEE; amoxicillin planned for a year after that episode).  Second event may not have been true endocarditis. 5. SSS: virtually 100 % A paced V sensed, good histogram distribution.  Does not have symptoms of chronotropic incompetence. 6. PPM: followed by Dr. Lovena Le, last download 07/10/2020. 7. Parox AFib: last meaningful episode in October 2021, overall only 0.1% prevalence. 8. LBBB: Despite evidence of intraventricular conduction abnormalities, he never requires ventricular pacing. 9. Anticoagulation: on dose adjusted Eliquis (age, renal dysfunction). Occasional epistaxis, none recently. 10. S/P bilat CEA: last duplex US in 2017;< 40% on R, 40-59% on L. 11. HTN: Well-controlled 12. HLP: Most recent LDL cholesterol 42.  Continue atorvastatin. 13. CKD 4: baseline creatinine 1.9-2.2 (GFR 25-30).  Had a brief discussion with him about the significance of his advanced chronic kidney disease and encouraged him to see a nephrologist and also to give some thought to whether or not would be interested in hemodialysis he is almost 87 years old and may not be a good candidate for renal replacement therapy. 14. Presyncope: after a hot shower in early March.  We will cut down on his isosorbide to 30 mg once daily and consider  stopping it altogether if he does not have angina. 15. Lumbar spondylosis: R sciatica is the biggest limitation to physical activity.        Medication Adjustments/Labs and Tests Ordered: Current medicines are reviewed at length with the patient today.  Concerns regarding medicines are outlined above.  Orders Placed This Encounter  Procedures  . CBC  . Comprehensive metabolic panel  . Lipid panel  . TSH  . Ambulatory referral to Nephrology  . EKG 12-Lead   Meds ordered this encounter  Medications  . metoprolol succinate (TOPROL-XL) 100 MG 24 hr tablet    Sig: Take 1 tablet (100 mg total) by mouth daily. Take with or immediately following a meal.    Dispense:  90 tablet    Refill:  3  . furosemide (LASIX) 20 MG tablet    Sig: Take 1 tablet (20 mg total) by mouth daily as needed for fluid or edema.    Dispense:  30 tablet    Refill:  3  . isosorbide mononitrate (IMDUR) 30 MG 24 hr tablet    Sig: Take 1 tablet (30 mg total) by mouth daily.    Dispense:  90 tablet    Refill:  3    Dose decrease.  Please discontinue prescription for 120 mg tablets    Patient Instructions  Medication Instructions:  STOP the Levothyroxine DECREASE the Imdur to 30 mg once daily  *If you need a refill on your cardiac medications before your next appointment, please call your pharmacy*   Lab Work: Your provider would like for you to return in 6 weeks to have the following labs drawn: Fasting Lipid, CMET, CBC and TSH. You do not need an appointment for the lab. Once in our office lobby there is a podium where you can sign in and ring the doorbell to alert [place] that you are here. The  lab is open from 8:00 am to 4:30 pm; closed for lunch from 12:45pm-1:45pm.  If you have labs (blood work) drawn today and your tests are completely normal, you will receive your results only by: Marland Kitchen MyChart Message (if you have MyChart) OR . A paper copy in the mail If you have any lab test that is abnormal or we need to  change your treatment, we will call you to review the results.   Testing/Procedures: None ordered   Follow-Up: At [facility], you and your health needs are our priority.  As part of our continuing mission to provide you with exceptional heart care, we have created designated Provider Care Teams.  These Care Teams include your primary Cardiologist (physician) and Advanced Practice Providers (APPs -  Physician Assistants and Nurse Practitioners) who all work together to provide you with the care you need, when you need it.  We recommend signing up for the patient portal called "MyChart".  Sign up information is provided on this After Visit Summary.  MyChart is used to connect with patients for Virtual Visits (Telemedicine).  Patients are able to view lab/test results, encounter notes, upcoming appointments, etc.  Non-urgent messages can be sent to your provider as well.   To learn more about what you can do with MyChart, go to [email or web address].    Your next appointment:   12 month(s)  The format for your next appointment:   In Person  Provider:   You may see Sanda Klein, MD or one of the following Advanced Practice Providers on your designated Care Team:    Almyra Deforest, PA-C  Fabian Sharp, [place] or   Roby Lofts, [place]    Other Instructions A referral has been placed to [place], Sanda Klein, MD  09/16/2020 9:52 AM    [place]

## 2020-09-10 ENCOUNTER — Other Ambulatory Visit: Payer: Self-pay

## 2020-09-10 ENCOUNTER — Encounter: Payer: Self-pay | Admitting: Cardiovascular Disease

## 2020-09-10 ENCOUNTER — Ambulatory Visit (INDEPENDENT_AMBULATORY_CARE_PROVIDER_SITE_OTHER): Payer: Medicare Other | Admitting: Cardiovascular Disease

## 2020-09-10 VITALS — BP 132/68 | HR 69 | Ht 67.0 in | Wt 136.6 lb

## 2020-09-10 DIAGNOSIS — R55 Syncope and collapse: Secondary | ICD-10-CM

## 2020-09-10 DIAGNOSIS — I10 Essential (primary) hypertension: Secondary | ICD-10-CM

## 2020-09-10 DIAGNOSIS — Z9889 Other specified postprocedural states: Secondary | ICD-10-CM

## 2020-09-10 DIAGNOSIS — Z7901 Long term (current) use of anticoagulants: Secondary | ICD-10-CM

## 2020-09-10 DIAGNOSIS — I5032 Chronic diastolic (congestive) heart failure: Secondary | ICD-10-CM

## 2020-09-10 DIAGNOSIS — I495 Sick sinus syndrome: Secondary | ICD-10-CM

## 2020-09-10 DIAGNOSIS — Z952 Presence of prosthetic heart valve: Secondary | ICD-10-CM | POA: Diagnosis not present

## 2020-09-10 DIAGNOSIS — I480 Paroxysmal atrial fibrillation: Secondary | ICD-10-CM

## 2020-09-10 DIAGNOSIS — I2510 Atherosclerotic heart disease of native coronary artery without angina pectoris: Secondary | ICD-10-CM

## 2020-09-10 DIAGNOSIS — M4306 Spondylolysis, lumbar region: Secondary | ICD-10-CM

## 2020-09-10 DIAGNOSIS — I447 Left bundle-branch block, unspecified: Secondary | ICD-10-CM

## 2020-09-10 DIAGNOSIS — Z950 Presence of cardiac pacemaker: Secondary | ICD-10-CM

## 2020-09-10 DIAGNOSIS — Z8679 Personal history of other diseases of the circulatory system: Secondary | ICD-10-CM

## 2020-09-10 DIAGNOSIS — N184 Chronic kidney disease, stage 4 (severe): Secondary | ICD-10-CM

## 2020-09-10 DIAGNOSIS — E7800 Pure hypercholesterolemia, unspecified: Secondary | ICD-10-CM

## 2020-09-10 MED ORDER — ISOSORBIDE MONONITRATE ER 30 MG PO TB24
30.0000 mg | ORAL_TABLET | Freq: Every day | ORAL | 3 refills | Status: DC
Start: 2020-09-10 — End: 2021-08-29

## 2020-09-10 MED ORDER — METOPROLOL SUCCINATE ER 100 MG PO TB24
100.0000 mg | ORAL_TABLET | Freq: Every day | ORAL | 3 refills | Status: DC
Start: 2020-09-10 — End: 2021-08-29

## 2020-09-10 MED ORDER — FUROSEMIDE 20 MG PO TABS: 20.0000 mg | ORAL_TABLET | Freq: Every day | ORAL | 3 refills | Status: DC | PRN

## 2020-09-10 NOTE — Patient Instructions (Signed)
Medication Instructions:  STOP the Levothyroxine DECREASE the Imdur to 30 mg once daily  *If you need a refill on your cardiac medications before your next appointment, please call your pharmacy*   Lab Work: Your provider would like for you to return in 6 weeks to have the following labs drawn: Fasting Lipid, CMET, CBC and TSH. You do not need an appointment for the lab. Once in our office lobby there is a podium where you can sign in and ring the doorbell to alert [place] that you are here. The lab is open from 8:00 am to 4:30 pm; closed for lunch from 12:45pm-1:45pm.  If you have labs (blood work) drawn today and your tests are completely normal, you will receive your results only by: Marland Kitchen MyChart Message (if you have MyChart) OR . A paper copy in the mail If you have any lab test that is abnormal or we need to change your treatment, we will call you to review the results.   Testing/Procedures: None ordered   Follow-Up: At [facility], you and your health needs are our priority.  As part of our continuing mission to provide you with exceptional heart care, we have created designated Provider Care Teams.  These Care Teams include your primary Cardiologist (physician) and Advanced Practice Providers (APPs -  Physician Assistants and Nurse Practitioners) who all work together to provide you with the care you need, when you need it.  We recommend signing up for the patient portal called "MyChart".  Sign up information is provided on this After Visit Summary.  MyChart is used to connect with patients for Virtual Visits (Telemedicine).  Patients are able to view lab/test results, encounter notes, upcoming appointments, etc.  Non-urgent messages can be sent to your provider as well.   To learn more about what you can do with MyChart, go to [email or web address].    Your next appointment:   12 month(s)  The format for your next appointment:   In Person  Provider:   You may see Sanda Klein,  MD or one of the following Advanced Practice Providers on your designated Care Team:    Almyra Deforest, PA-C  Fabian Sharp, [place] or   Roby Lofts, [place]    Other Instructions A referral has been placed to [facility]

## 2020-09-14 ENCOUNTER — Other Ambulatory Visit: Payer: Self-pay | Admitting: Cardiology

## 2020-09-14 ENCOUNTER — Other Ambulatory Visit: Payer: Self-pay | Admitting: Cardiovascular Disease

## 2020-09-14 DIAGNOSIS — I480 Paroxysmal atrial fibrillation: Secondary | ICD-10-CM

## 2020-09-14 NOTE — Telephone Encounter (Signed)
Pt last saw Dr Sallyanne Kuster 09/10/20, last labs 03/15/20 Creat 2.11, age 87, weight 62kg, based on specified criteria pt is on appropriate dosage of Eliquis 2.5mg  BID.  Will refill rx.

## 2020-09-14 NOTE — Telephone Encounter (Signed)
nelsonn pt but this looks like it was previously refilled by erika byrum today I will route to [facility] anticoag pool for verification

## 2020-09-16 ENCOUNTER — Encounter: Payer: Self-pay | Admitting: Cardiovascular Disease

## 2020-09-17 NOTE — Progress Notes (Signed)
Subjective:   Ryan [name] is a 87 y.o. male who presents for Medicare Annual/Subsequent preventive examination.  I connected with [name] today by telephone and verified that I am speaking with the correct person using two identifiers. Location patient: home Location provider: work Persons participating in the virtual visit: patient, provider.   I discussed the limitations, risks, security and privacy concerns of performing an evaluation and management service by telephone and the availability of in person appointments. I also discussed with the patient that there may be a patient responsible charge related to this service. The patient expressed understanding and verbally consented to this telephonic visit.    Interactive audio and video telecommunications were attempted between this provider and patient, however failed, due to patient having technical difficulties OR patient did not have access to video capability.  We continued and completed visit with audio only.      Review of Systems    n/a Cardiac Risk Factors include: advanced age (>63men, >79 women);hypertension;male gender     Objective:    Today's Vitals   There is no height or weight on file to calculate BMI.  Advanced Directives 04/16/2020 03/04/2020 04/29/2018 04/28/2018 04/09/2018 03/30/2018 03/28/2018  Does Patient Have a Medical Advance Directive? Yes Yes No No Yes Yes Yes  Type of Paramedic of Onset;Living will [place] will -  Does patient want to make changes to medical advance directive? No - Patient declined No - Patient declined - - - No - Patient declined -  Copy of [place] in Chart? No - copy requested - - - - - -  Would patient like information on creating a medical advance directive? - - No - Patient declined - - - -    Current Medications (verified) Outpatient Encounter Medications as of 09/18/2020  Medication  Sig  . acetaminophen (TYLENOL) 500 MG tablet Take 500 mg by mouth daily.  Marland Kitchen acetaminophen-codeine (TYLENOL #3) 300-30 MG tablet Take 1 tablet by mouth every 8 (eight) hours as needed for moderate pain.  Marland Kitchen albuterol (VENTOLIN HFA) 108 (90 Base) MCG/ACT inhaler INHALE 2 PUFFS INTO THE LUNGS EVERY 6 HOURS AS NEEDED FOR WHEEZING OR SHORTNESS OF BREATH  . amLODipine (NORVASC) 5 MG tablet TAKE 1 TABLET(5 MG) BY MOUTH DAILY  . amoxicillin (AMOXIL) 500 MG tablet TAKE 1 TABLET(500 MG) BY MOUTH TWICE DAILY  . atorvastatin (LIPITOR) 40 MG tablet TAKE 1 TABLET(40 MG) BY MOUTH DAILY AT 6 PM  . cholecalciferol (VITAMIN D) 1000 units tablet Take 1,000 Units by mouth 2 (two) times a week. Monday and Thursday  . ELIQUIS 2.5 MG TABS tablet TAKE 1 TABLET(2.5 MG) BY MOUTH TWICE DAILY  . furosemide (LASIX) 20 MG tablet Take 1 tablet (20 mg total) by mouth daily as needed for fluid or edema.  . isosorbide mononitrate (IMDUR) 30 MG 24 hr tablet Take 1 tablet (30 mg total) by mouth daily.  Marland Kitchen latanoprost (XALATAN) 0.005 % ophthalmic solution Place 1 drop into both eyes at bedtime.  . metoprolol succinate (TOPROL-XL) 100 MG 24 hr tablet Take 1 tablet (100 mg total) by mouth daily. Take with or immediately following a meal.  . nitroGLYCERIN (NITROSTAT) 0.4 MG SL tablet Place 1 tablet (0.4 mg total) under the tongue every 5 (five) minutes x 3 doses as needed for chest pain.  . vitamin B-12 (CYANOCOBALAMIN) 1000 MCG tablet Take 1,000 mcg by mouth daily.   No facility-administered encounter  medications on file as of 09/18/2020.    Allergies (verified) Ciprofloxacin, Hydrochlorothiazide, Sulfa antibiotics, Ace inhibitors, Losartan, Carvedilol, Lisinopril, and Soy allergy   History: Past Medical History:  Diagnosis Date  . Age-related macular degeneration   . Allergic rhinitis   . 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 Anemia 12/2017   normocytic.  iron deficient, ferritin 17 04/2018  . Arthritis    "hands, lower  back" (04/30/2016)  . Asthma   . Bacterial endocarditis   . Carotid artery obstruction    a. s/p bilat CEA.  . Chronic combined systolic (congestive) and diastolic (congestive) heart failure ([place]) 03/18/2016  . Chronic lower back pain   . CKD (chronic kidney disease) stage 4, GFR 15-29 ml/min (HCC) 12/2017  . Claudication in peripheral vascular disease ([place]) 01/01/2018   Claudication  . Coronary arteriosclerosis in native artery    a. 2017 Cardiac catheterization demonstrated worsening CAD with 70% mid LCx, 80% OM1 and 80% mid RCA stenosis. b.  In 11/17, he underwent successful rotational atherectomy and DES to RCA.  Marland Kitchen Decreased diffusion capacity 10/23/2017  . Diverticulitis of colon   . DJD (degenerative joint disease)   . Elevated TSH 06/30/2017  . GERD (gastroesophageal reflux disease)   . History of hiatal hernia   . HLD (hyperlipidemia)   . Hypertension   . Hypertensive heart disease with heart failure ([place])   . Intractable back pain 11/07/2017  . OSA (obstructive sleep apnea)    intolerant to CPAP  . PAF (paroxysmal atrial fibrillation) ([place])   . Paroxysmal supraventricular tachycardia ([place])   . Presence of permanent cardiac pacemaker   . Primary malignant neoplasm of bladder ([place])   . Prostate cancer ([place])   . 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 S/P AVR 12/02/2017  . Syncope 10/23/2017  . Tachy-brady syndrome ([place])   . Thrombocytopenia ([place]) 11/07/2017  . Urinary retention 11/07/2017   Past Surgical History:  Procedure Laterality Date  . APPENDECTOMY    . CARDIAC CATHETERIZATION N/A 01/17/2016   Procedure: Right/Left Heart Cath and Coronary Angiography;  Surgeon: Belva Crome, MD;  Location: [facility];  Service: Cardiovascular;  Laterality: N/A;  . CARDIAC CATHETERIZATION N/A 04/30/2016   Procedure: Coronary/Graft Atherectomy;  Surgeon: Sherren Mocha, MD;  Location: [facility];  Service: Cardiovascular;  Laterality: N/A;   . CAROTID ENDARTERECTOMY Bilateral   . CATARACT EXTRACTION W/ INTRAOCULAR LENS  IMPLANT, BILATERAL Bilateral   . COLON SURGERY  1981   Meckles Diverticulum with volvulus  . COLONOSCOPY WITH PROPOFOL N/A 05/01/2018   Procedure: COLONOSCOPY WITH PROPOFOL;  Surgeon: Rush Landmark Telford Nab., MD;  Location: [facility];  Service: Gastroenterology;  Laterality: N/A;  . CORONARY ANGIOGRAPHY N/A 01/18/2018   Procedure: CORONARY ANGIOGRAPHY;  Surgeon: Lorretta Harp, MD;  Location: [facility];  Service: Cardiovascular;  Laterality: N/A;  . ENTEROSCOPY N/A 04/30/2018   Procedure: ENTEROSCOPY;  Surgeon: Rush Landmark Telford Nab., MD;  Location: [facility];  Service: Gastroenterology;  Laterality: N/A;  . EP IMPLANTABLE DEVICE N/A 03/21/2016   Procedure: Pacemaker Implant;  Surgeon: Evans Lance, MD;  Location: [facility];  Service: Cardiovascular;  Laterality: N/A;  . EYE SURGERY    . INGUINAL HERNIA REPAIR Right 2009  . INSERT / REPLACE / REMOVE PACEMAKER    . INSERTION PROSTATE RADIATION SEED    . POLYPECTOMY  05/01/2018   Procedure: POLYPECTOMY;  Surgeon: Mansouraty, Telford Nab., MD;  Location: [facility];  Service:  Gastroenterology;;  . TEE WITHOUT CARDIOVERSION N/A 06/10/2016   Procedure: TRANSESOPHAGEAL ECHOCARDIOGRAM (TEE);  Surgeon: Sherren Mocha, MD;  Location: [facility];  Service: Open Heart Surgery;  Laterality: N/A;  . TEE WITHOUT CARDIOVERSION N/A 11/10/2017   Procedure: TRANSESOPHAGEAL ECHOCARDIOGRAM (TEE);  Surgeon: Acie Fredrickson Wonda Cheng, MD;  Location[facility];  Service: Cardiovascular;  Laterality: N/A;  . TEE WITHOUT CARDIOVERSION N/A 12/15/2017   Procedure: TRANSESOPHAGEAL ECHOCARDIOGRAM (TEE);  Surgeon: Buford Dresser, MD;  Location: [facility] ENDOSCOPY;  Service: Cardiovascular;  Laterality: N/A;  . TEE WITHOUT CARDIOVERSION N/A 03/30/2018   Procedure: TRANSESOPHAGEAL ECHOCARDIOGRAM (TEE);  Surgeon: Jerline Pain, MD;  Location: [facility] ENDOSCOPY;  Service:  Cardiovascular;  Laterality: N/A;  . TONSILLECTOMY  ~ 1947  . TRANSCATHETER AORTIC VALVE REPLACEMENT, TRANSFEMORAL N/A 06/10/2016   Procedure: TRANSCATHETER AORTIC VALVE REPLACEMENT, TRANSFEMORAL;  Surgeon: Sherren Mocha, MD;  Location: [facility];  Service: Open Heart Surgery;  Laterality: N/A;   Family History  Problem Relation Age of Onset  . Heart disease Mother   . Heart disease Father    Social History   Socioeconomic History  . Marital status: Married    Spouse name: Not on file  . Number of children: Not on file  . Years of education: Not on file  . Highest education level: Not on file  Occupational History  . Occupation: retired  Tobacco Use  . Smoking status: Never Smoker  . Smokeless tobacco: Never Used  Vaping Use  . Vaping Use: Never used  Substance and Sexual Activity  . Alcohol use: No    Alcohol/week: 0.0 standard drinks    Comment: 04/30/2016 "nothing in years"  . Drug use: No  . Sexual activity: Never  Other Topics Concern  . Not on file  Social History Narrative   Retired    Three children - One in [place], One in [place], and one in [place].       Social Determinants of Health   Financial Resource Strain: Low Risk   . Difficulty of Paying Living Expenses: Not hard at all  Food Insecurity: Not on file  Transportation Needs: No Transportation Needs  . Lack of Transportation (Medical): No  . Lack of Transportation (Non-Medical): No  Physical Activity: Inactive  . Days of Exercise per Week: 0 days  . Minutes of Exercise per Session: 0 min  Stress: No Stress Concern Present  . Feeling of Stress : Not at all  Social Connections: Moderately Integrated  . Frequency of Communication with Friends and Family: More than three times a week  . Frequency of Social Gatherings with Friends and Family: More than three times a week  . Attends Religious Services: 1 to 4 times per year  . Active Member of Clubs or Organizations: No  . Attends Archivist  Meetings: Never  . Marital Status: Married    Tobacco Counseling Counseling given: Not Answered   Clinical Intake:  Pre-visit preparation completed: Yes  Pain : No/denies pain     Nutritional Risks: None Diabetes: No  How often do you need to have someone help you when you read instructions, pamphlets, or other written materials from your doctor or pharmacy?: 1 - Never  Diabetic?no  Interpreter Needed?: No  Comments: L.Jahnay Lantier,LPN   Activities of Daily Living In your present state of health, do you have any difficulty performing the following activities: 09/18/2020 03/04/2020  Hearing? N N  Vision? N N  Difficulty concentrating or making decisions? N N  Walking or climbing stairs? N N  Dressing or bathing? N N  Doing errands, shopping? N N  Preparing Food and eating ? N -  Using the Toilet? N -  In the past six months, have you accidently leaked urine? N -  Do you have problems with loss of bowel control? N -  Managing your Medications? N -  Managing your Finances? N -  Housekeeping or managing your Housekeeping? N -  Some recent data might be hidden    Patient Care Team: Dorothyann Peng, NP as PCP - General (Family Medicine) Croitoru, Dani Gobble, MD as PCP - Cardiology (Cardiology)  Indicate any recent Medical Services you may have received from other than [facility] providers in the past year (date may be approximate).     Assessment:   This is a routine wellness examination for Ryan [name].  Hearing/Vision screen  Hearing Screening   125Hz  250Hz  500Hz  1000Hz  2000Hz  3000Hz  4000Hz  6000Hz  8000Hz   Right ear:           Left ear:           Vision Screening Comments: Annual eye exams wears glasses   Dietary issues and exercise activities discussed: Current Exercise Habits: Home exercise routine, Type of exercise: walking, Time (Minutes): 60, Frequency (Times/Week): 7, Weekly Exercise (Minutes/Week): 420, Intensity: Mild, Exercise limited by: orthopedic condition(s)  Goals    None    Depression Screen PHQ 2/9 Scores 09/18/2020 03/20/2020 04/22/2018 12/09/2017 04/30/2017 04/30/2017 08/04/2016  PHQ - 2 Score 0 0 0 0 0 0 0    Fall Risk Fall Risk  09/18/2020 03/20/2020 04/22/2018 12/09/2017 04/30/2017  Falls in the past year? 0 0 0 No No  Number falls in past yr: 0 - - - -  Injury with Fall? 0 - - - -  Risk for fall due to : Impaired balance/gait Impaired balance/gait;Impaired mobility - - -  Follow up Falls evaluation completed Falls evaluation completed - - -    FALL RISK PREVENTION PERTAINING TO THE HOME:  Any stairs in or around the home? Yes  If so, are there any without handrails? Yes  Home free of loose throw rugs in walkways, pet beds, electrical cords, etc? Yes  Adequate lighting in your home to reduce risk of falls? Yes   ASSISTIVE DEVICES UTILIZED TO PREVENT FALLS:  Life alert? No  Use of a ca ne, walker or w/c? Yes  Grab bars in the bathroom? Yes  Shower chair or bench in shower? Yes  Elevated toilet seat or a handicapped toilet? Yes   Cognitive Function:     Normal cognitive status assessed by direct observation by this Nurse Health Advisor. No abnormalities found.      Immunizations Immunization History  Administered Date(s) Administered  . Fluad Quad(high Dose 65+) 03/15/2020  . Influenza, High Dose Seasonal PF 03/08/2018  . Influenza,inj,Quad PF,6+ Mos 03/26/2017  . PFIZER(Purple Top)SARS-COV-2 Vaccination 06/18/2019, 07/09/2019  . Pneumococcal Conjugate-13 04/30/2017  . Tdap 04/01/2015    TDAP status: Up to date  Flu Vaccine status: Up to date  Pneumococcal vaccine status: Up to date  Covid-19 vaccine status: Completed vaccines  Qualifies for Shingles Vaccine? Yes   Zostavax completed No   Shingrix Completed?: No.    Education has been provided regarding the importance of this vaccine. Patient has been advised to call insurance company to determine out of pocket expense if they have not yet received this vaccine. Advised  may also receive vaccine at local pharmacy or Health Dept. Verbalized acceptance and understanding.  Screening Tests Health Maintenance  Topic Date Due  . PNA vac Low Risk Adult (2 of 2 - PPSV23) 04/30/2018  . COVID-19 Vaccine (3 - Booster for Pfizer series) 01/06/2020  . INFLUENZA VACCINE  12/31/2020  . TETANUS/TDAP  03/31/2025  . HPV VACCINES  Aged Out    Health Maintenance  Health Maintenance Due  Topic Date Due  . PNA vac Low Risk Adult (2 of 2 - PPSV23) 04/30/2018  . COVID-19 Vaccine (3 - Booster for Pfizer series) 01/06/2020    Colorectal cancer screening: No longer required.   Lung Cancer Screening: (Low Dose CT Chest recommended if Age 10-80 years, 30 pack-year currently smoking OR have quit w/in 15years.) does not qualify.   Lung Cancer Screening Referral: n/a  Additional Screening:  Hepatitis C Screening: does not qualify  Vision Screening: Recommended annual ophthalmology exams for early detection of glaucoma and other disorders of the eye. Is the patient up to date with their annual eye exam?  Yes  Who is the provider or what is the name of the office in which the patient attends annual eye exams? Dr.Digby If pt is not established with a provider, would they like to be referred to a provider to establish care? No .   Dental Screening: Recommended annual dental exams for proper oral hygiene  Community Resource Referral / Chronic Care Management: CRR required this visit?  No   CCM required this visit?  No      Plan:     I have personally reviewed and noted the following in the patient's chart:   . Medical and social history . Use of alcohol, tobacco or illicit drugs  . Current medications and supplements . Functional ability and status . Nutritional status . Physical activity . Advanced directives . List of other physicians . Hospitalizations, surgeries, and ER visits in previous 12 months . Vitals . Screenings to include cognitive, depression, and  falls . Referrals and appointments  In addition, I have reviewed and discussed with patient certain preventive protocols, quality metrics, and best practice recommendations. A written personalized care plan for preventive services as well as general preventive health recommendations were provided to patient.     Randel Pigg, LPN   [id number]/81/[id number]   Nurse Notes: none

## 2020-09-18 ENCOUNTER — Ambulatory Visit (INDEPENDENT_AMBULATORY_CARE_PROVIDER_SITE_OTHER): Payer: Medicare Other

## 2020-09-18 DIAGNOSIS — Z0000 Encounter for general adult medical examination without abnormal findings: Secondary | ICD-10-CM

## 2020-09-18 NOTE — Patient Instructions (Signed)
Ryan [name] , Thank you for taking time to come for your Medicare Wellness Visit. I appreciate your ongoing commitment to your health goals. Please review the following plan we discussed and let me know if I can assist you in the future.   Screening recommendations/referrals: Colonoscopy: no longer required Recommended yearly ophthalmology/optometry visit for glaucoma screening and checkup Recommended yearly dental visit for hygiene and checkup  Vaccinations: Influenza vaccine: Current due fall 2022 Pneumococcal vaccine: completed series Tdap vaccine: current due 03/31/2025 Shingles vaccine: pt declines    Advanced directives: will provide copies  Conditions/risks identified: none  Next appointment: none  Preventive Care 46 Years and Older, Male Preventive care refers to lifestyle choices and visits with your health care provider that can promote health and wellness. What does preventive care include?  A yearly physical exam. This is also called an annual well check.  Dental exams once or twice a year.  Routine eye exams. Ask your health care provider how often you should have your eyes checked.  Personal lifestyle choices, including:  Daily care of your teeth and gums.  Regular physical activity.  Eating a healthy diet.  Avoiding tobacco and drug use.  Limiting alcohol use.  Practicing safe sex.  Taking low doses of aspirin every day.  Taking vitamin and mineral supplements as recommended by your health care provider. What happens during an annual well check? The services and screenings done by your health care provider during your annual well check will depend on your age, overall health, lifestyle risk factors, and family history of disease. Counseling  Your health care provider may ask you questions about your:  Alcohol use.  Tobacco use.  Drug use.  Emotional well-being.  Home and relationship well-being.  Sexual activity.  Eating  habits.  History of falls.  Memory and ability to understand (cognition).  Work and work Statistician. Screening  You may have the following tests or measurements:  Height, weight, and BMI.  Blood pressure.  Lipid and cholesterol levels. These may be checked every 5 years, or more frequently if you are over 31 years old.  Skin check.  Lung cancer screening. You may have this screening every year starting at age 56 if you have a 30-pack-year history of smoking and currently smoke or have quit within the past 15 years.  Fecal occult blood test (FOBT) of the stool. You may have this test every year starting at age 37.  Flexible sigmoidoscopy or colonoscopy. You may have a sigmoidoscopy every 5 years or a colonoscopy every 10 years starting at age 28.  Prostate cancer screening. Recommendations will vary depending on your family history and other risks.  Hepatitis C blood test.  Hepatitis B blood test.  Sexually transmitted disease (STD) testing.  Diabetes screening. This is done by checking your blood sugar (glucose) after you have not eaten for a while (fasting). You may have this done every 1-3 years.  Abdominal aortic aneurysm (AAA) screening. You may need this if you are a current or former smoker.  Osteoporosis. You may be screened starting at age 82 if you are at high risk. Talk with your health care provider about your test results, treatment options, and if necessary, the need for more tests. Vaccines  Your health care provider may recommend certain vaccines, such as:  Influenza vaccine. This is recommended every year.  Tetanus, diphtheria, and acellular pertussis (Tdap, Td) vaccine. You may need a Td booster every 10 years.  Zoster vaccine. You may need this  after age 68.  Pneumococcal 13-valent conjugate (PCV13) vaccine. One dose is recommended after age 7.  Pneumococcal polysaccharide (PPSV23) vaccine. One dose is recommended after age 27. Talk to your health  care provider about which screenings and vaccines you need and how often you need them. This information is not intended to replace advice given to you by your health care provider. Make sure you discuss any questions you have with your health care provider. Document Released: 06/15/2015 Document Revised: 02/06/2016 Document Reviewed: 03/20/2015 Elsevier Interactive Patient Education  2017 [employer] Prevention in the Home Falls can cause injuries. They can happen to people of all ages. There are many things you can do to make your home safe and to help prevent falls. What can I do on the outside of my home?  Regularly fix the edges of walkways and driveways and fix any cracks.  Remove anything that might make you trip as you walk through a door, such as a raised step or threshold.  Trim any bushes or trees on the path to your home.  Use bright outdoor lighting.  Clear any walking paths of anything that might make someone trip, such as rocks or tools.  Regularly check to see if handrails are loose or broken. Make sure that both sides of any steps have handrails.  Any raised decks and porches should have guardrails on the edges.  Have any leaves, snow, or ice cleared regularly.  Use sand or salt on walking paths during winter.  Clean up any spills in your garage right away. This includes oil or grease spills. What can I do in the bathroom?  Use night lights.  Install grab bars by the toilet and in the tub and shower. Do not use towel bars as grab bars.  Use non-skid mats or decals in the tub or shower.  If you need to sit down in the shower, use a plastic, non-slip stool.  Keep the floor dry. Clean up any water that spills on the floor as soon as it happens.  Remove soap buildup in the tub or shower regularly.  Attach bath mats securely with double-sided non-slip rug tape.  Do not have throw rugs and other things on the floor that can make you trip. What can I do  in the bedroom?  Use night lights.  Make sure that you have a light by your bed that is easy to reach.  Do not use any sheets or blankets that are too big for your bed. They should not hang down onto the floor.  Have a firm chair that has side arms. You can use this for support while you get dressed.  Do not have throw rugs and other things on the floor that can make you trip. What can I do in the kitchen?  Clean up any spills right away.  Avoid walking on wet floors.  Keep items that you use a lot in easy-to-reach places.  If you need to reach something above you, use a strong step stool that has a grab bar.  Keep electrical cords out of the way.  Do not use floor polish or wax that makes floors slippery. If you must use wax, use non-skid floor wax.  Do not have throw rugs and other things on the floor that can make you trip. What can I do with my stairs?  Do not leave any items on the stairs.  Make sure that there are handrails on both sides of the  stairs and use them. Fix handrails that are broken or loose. Make sure that handrails are as long as the stairways.  Check any carpeting to make sure that it is firmly attached to the stairs. Fix any carpet that is loose or worn.  Avoid having throw rugs at the top or bottom of the stairs. If you do have throw rugs, attach them to the floor with carpet tape.  Make sure that you have a light switch at the top of the stairs and the bottom of the stairs. If you do not have them, ask someone to add them for you. What else can I do to help prevent falls?  Wear shoes that:  Do not have high heels.  Have rubber bottoms.  Are comfortable and fit you well.  Are closed at the toe. Do not wear sandals.  If you use a stepladder:  Make sure that it is fully opened. Do not climb a closed stepladder.  Make sure that both sides of the stepladder are locked into place.  Ask someone to hold it for you, if possible.  Clearly mark  and make sure that you can see:  Any grab bars or handrails.  First and last steps.  Where the edge of each step is.  Use tools that help you move around (mobility aids) if they are needed. These include:  Canes.  Walkers.  Scooters.  Crutches.  Turn on the lights when you go into a dark area. Replace any light bulbs as soon as they burn out.  Set up your furniture so you have a clear path. Avoid moving your furniture around.  If any of your floors are uneven, fix them.  If there are any pets around you, be aware of where they are.  Review your medicines with your doctor. Some medicines can make you feel dizzy. This can increase your chance of falling. Ask your doctor what other things that you can do to help prevent falls. This information is not intended to replace advice given to you by your health care provider. Make sure you discuss any questions you have with your health care provider. Document Released: 03/15/2009 Document Revised: 10/25/2015 Document Reviewed: 06/23/2014 Elsevier Interactive Patient Education  2017 [employer].

## 2020-10-08 ENCOUNTER — Ambulatory Visit (INDEPENDENT_AMBULATORY_CARE_PROVIDER_SITE_OTHER): Payer: Medicare Other

## 2020-10-08 DIAGNOSIS — I495 Sick sinus syndrome: Secondary | ICD-10-CM | POA: Diagnosis not present

## 2020-10-08 LAB — CUP PACEART REMOTE DEVICE CHECK
Battery Impedance: 283 Ohm
Battery Remaining Longevity: 107 mo
Battery Voltage: 2.79 V
Brady Statistic AP VP Percent: 0 %
Brady Statistic AP VS Percent: 97 %
Brady Statistic AS VP Percent: 0 %
Brady Statistic AS VS Percent: 3 %
Date Time Interrogation Session: 20220509085733
Implantable Lead Implant Date: 20171020
Implantable Lead Implant Date: 20171020
Implantable Lead Location: 753859
Implantable Lead Location: 753860
Implantable Lead Model: 5076
Implantable Lead Model: 5076
Implantable Pulse Generator Implant Date: 20171020
Lead Channel Impedance Value: 453 Ohm
Lead Channel Impedance Value: 599 Ohm
Lead Channel Pacing Threshold Amplitude: 0.5 V
Lead Channel Pacing Threshold Amplitude: 0.75 V
Lead Channel Pacing Threshold Pulse Width: 0.4 ms
Lead Channel Pacing Threshold Pulse Width: 0.4 ms
Lead Channel Setting Pacing Amplitude: 2 V
Lead Channel Setting Pacing Amplitude: 2.5 V
Lead Channel Setting Pacing Pulse Width: 0.4 ms
Lead Channel Setting Sensing Sensitivity: 4 mV

## 2020-10-09 ENCOUNTER — Other Ambulatory Visit: Payer: Self-pay | Admitting: Adult Health

## 2020-10-09 DIAGNOSIS — G8929 Other chronic pain: Secondary | ICD-10-CM

## 2020-10-12 DIAGNOSIS — M1711 Unilateral primary osteoarthritis, right knee: Secondary | ICD-10-CM | POA: Diagnosis not present

## 2020-10-18 ENCOUNTER — Ambulatory Visit: Payer: Medicare Other | Admitting: Cardiology

## 2020-10-24 DIAGNOSIS — E7800 Pure hypercholesterolemia, unspecified: Secondary | ICD-10-CM | POA: Diagnosis not present

## 2020-10-24 DIAGNOSIS — I5032 Chronic diastolic (congestive) heart failure: Secondary | ICD-10-CM | POA: Diagnosis not present

## 2020-10-24 DIAGNOSIS — I480 Paroxysmal atrial fibrillation: Secondary | ICD-10-CM | POA: Diagnosis not present

## 2020-10-24 LAB — COMPREHENSIVE METABOLIC PANEL
ALT: 14 IU/L (ref 0–44)
AST: 24 IU/L (ref 0–40)
Albumin/Globulin Ratio: 2.2 (ref 1.2–2.2)
Albumin: 4.3 g/dL (ref 3.6–4.6)
Alkaline Phosphatase: 93 IU/L (ref 44–121)
BUN/Creatinine Ratio: 20 (ref 10–24)
BUN: 39 mg/dL — ABNORMAL HIGH (ref 8–27)
Bilirubin Total: 0.4 mg/dL (ref 0.0–1.2)
CO2: 24 mmol/L (ref 20–29)
Calcium: 9.9 mg/dL (ref 8.6–10.2)
Chloride: 98 mmol/L (ref 96–106)
Creatinine, Ser: 1.94 mg/dL — ABNORMAL HIGH (ref 0.76–1.27)
Globulin, Total: 2 g/dL (ref 1.5–4.5)
Glucose: 91 mg/dL (ref 65–99)
Potassium: 4.4 mmol/L (ref 3.5–5.2)
Sodium: 139 mmol/L (ref 134–144)
Total Protein: 6.3 g/dL (ref 6.0–8.5)
eGFR: 33 mL/min/{1.73_m2} — ABNORMAL LOW (ref >59)

## 2020-10-24 LAB — LIPID PANEL
Chol/HDL Ratio: 2.7 ratio (ref 0.0–5.0)
Cholesterol, Total: 144 mg/dL (ref 100–199)
HDL: 54 mg/dL (ref >39)
LDL Chol Calc (NIH): 69 mg/dL (ref 0–99)
Triglycerides: 117 mg/dL (ref 0–149)
VLDL Cholesterol Cal: 21 mg/dL (ref 5–40)

## 2020-10-24 LAB — CBC
Hematocrit: 46.9 % (ref 37.5–51.0)
Hemoglobin: 15.5 g/dL (ref 13.0–17.7)
MCH: 30.2 pg (ref 26.6–33.0)
MCHC: 33 g/dL (ref 31.5–35.7)
MCV: 91 fL (ref 79–97)
Platelets: 168 10*3/uL (ref 150–450)
RBC: 5.13 x10E6/uL (ref 4.14–5.80)
RDW: 12.5 % (ref 11.6–15.4)
WBC: 6.7 10*3/uL (ref 3.4–10.8)

## 2020-10-24 LAB — TSH: TSH: 4.5 u[IU]/mL (ref 0.450–4.500)

## 2020-10-25 ENCOUNTER — Encounter: Payer: Self-pay | Admitting: *Deleted

## 2020-10-26 NOTE — Progress Notes (Signed)
Remote pacemaker transmission.   

## 2020-10-31 ENCOUNTER — Telehealth: Payer: Self-pay | Admitting: Cardiovascular Disease

## 2020-10-31 ENCOUNTER — Other Ambulatory Visit: Payer: Self-pay

## 2020-10-31 ENCOUNTER — Telehealth: Payer: Self-pay | Admitting: Adult Health

## 2020-10-31 MED ORDER — ATORVASTATIN CALCIUM 40 MG PO TABS: ORAL_TABLET | ORAL | 3 refills | Status: DC

## 2020-10-31 NOTE — Telephone Encounter (Signed)
*  STAT* If patient is at the pharmacy, call can be transferred to refill team.   1. Which medications need to be refilled? (please list name of each medication and dose if known)  atorvastatin (LIPITOR) 40 MG tablet  2. Which pharmacy/location (including street and city if local pharmacy) is medication to be sent to? [facility] [place], [place] AT [address]  3. Do they need a 30 day or 90 day supply? 90  Patient only has one pill left

## 2020-10-31 NOTE — Telephone Encounter (Signed)
Let patient know that refills for his medication has been sent to the pharmacy.

## 2020-10-31 NOTE — Chronic Care Management (AMB) (Signed)
  Chronic Care Management   Outreach Note  10/31/2020 Name: RENNER SEBALD MRN: [id number] DOB: 04/06/1930  Referred by: Dorothyann Peng, NP Reason for referral : No chief complaint on file.   A second unsuccessful telephone outreach was attempted today. The patient was referred to pharmacist for assistance with care management and care coordination.  Follow Up Plan:   Tatjana Dellinger Upstream Scheduler

## 2020-11-02 DIAGNOSIS — D631 Anemia in chronic kidney disease: Secondary | ICD-10-CM | POA: Diagnosis not present

## 2020-11-02 DIAGNOSIS — I129 Hypertensive chronic kidney disease with stage 1 through stage 4 chronic kidney disease, or unspecified chronic kidney disease: Secondary | ICD-10-CM | POA: Diagnosis not present

## 2020-11-02 DIAGNOSIS — N2581 Secondary hyperparathyroidism of renal origin: Secondary | ICD-10-CM | POA: Diagnosis not present

## 2020-11-02 DIAGNOSIS — N189 Chronic kidney disease, unspecified: Secondary | ICD-10-CM | POA: Diagnosis not present

## 2020-11-02 DIAGNOSIS — N184 Chronic kidney disease, stage 4 (severe): Secondary | ICD-10-CM | POA: Diagnosis not present

## 2020-11-02 LAB — CBC AND DIFFERENTIAL
HCT: 46 (ref 41–53)
Hemoglobin: 15.3 (ref 13.5–17.5)
Platelets: 152 (ref 150–399)
WBC: 5.5

## 2020-11-02 LAB — CBC: RBC: 5.9 — AB (ref 3.87–5.11)

## 2020-11-06 ENCOUNTER — Other Ambulatory Visit: Payer: Self-pay | Admitting: Nephrology

## 2020-11-06 DIAGNOSIS — I509 Heart failure, unspecified: Secondary | ICD-10-CM

## 2020-11-06 DIAGNOSIS — N189 Chronic kidney disease, unspecified: Secondary | ICD-10-CM

## 2020-11-06 DIAGNOSIS — N184 Chronic kidney disease, stage 4 (severe): Secondary | ICD-10-CM

## 2020-11-06 DIAGNOSIS — N2581 Secondary hyperparathyroidism of renal origin: Secondary | ICD-10-CM

## 2020-11-06 DIAGNOSIS — I129 Hypertensive chronic kidney disease with stage 1 through stage 4 chronic kidney disease, or unspecified chronic kidney disease: Secondary | ICD-10-CM

## 2020-11-06 DIAGNOSIS — D631 Anemia in chronic kidney disease: Secondary | ICD-10-CM

## 2020-11-09 ENCOUNTER — Ambulatory Visit
Admission: RE | Admit: 2020-11-09 | Discharge: 2020-11-09 | Disposition: A | Discharge status: Home / Self Care | Payer: Medicare Other | Source: Ambulatory Visit | Attending: Nephrology | Admitting: Nephrology

## 2020-11-09 DIAGNOSIS — N2581 Secondary hyperparathyroidism of renal origin: Secondary | ICD-10-CM

## 2020-11-09 DIAGNOSIS — N189 Chronic kidney disease, unspecified: Secondary | ICD-10-CM | POA: Diagnosis not present

## 2020-11-09 DIAGNOSIS — N184 Chronic kidney disease, stage 4 (severe): Secondary | ICD-10-CM

## 2020-11-09 DIAGNOSIS — D631 Anemia in chronic kidney disease: Secondary | ICD-10-CM

## 2020-11-09 DIAGNOSIS — I509 Heart failure, unspecified: Secondary | ICD-10-CM

## 2020-11-09 DIAGNOSIS — I129 Hypertensive chronic kidney disease with stage 1 through stage 4 chronic kidney disease, or unspecified chronic kidney disease: Secondary | ICD-10-CM

## 2020-11-14 ENCOUNTER — Telehealth: Payer: Self-pay | Admitting: Adult Health

## 2020-11-14 DIAGNOSIS — H353131 Nonexudative age-related macular degeneration, bilateral, early dry stage: Secondary | ICD-10-CM | POA: Diagnosis not present

## 2020-11-14 DIAGNOSIS — H26493 Other secondary cataract, bilateral: Secondary | ICD-10-CM | POA: Diagnosis not present

## 2020-11-14 DIAGNOSIS — H401213 Low-tension glaucoma, right eye, severe stage: Secondary | ICD-10-CM | POA: Diagnosis not present

## 2020-11-14 DIAGNOSIS — H401223 Low-tension glaucoma, left eye, severe stage: Secondary | ICD-10-CM | POA: Diagnosis not present

## 2020-11-14 NOTE — Chronic Care Management (AMB) (Signed)
  Chronic Care Management   Outreach Note  11/14/2020 Name: ASTIN SAYRE MRN: [id number] DOB: Feb 28, 1930  Referred by: Dorothyann Peng, NP Reason for referral : No chief complaint on file.   Third unsuccessful telephone outreach was attempted today. The patient was referred to the pharmacist for assistance with care management and care coordination.   Follow Up Plan:   Tatjana Dellinger Upstream Scheduler

## 2020-12-10 DIAGNOSIS — N2581 Secondary hyperparathyroidism of renal origin: Secondary | ICD-10-CM | POA: Diagnosis not present

## 2020-12-10 DIAGNOSIS — N184 Chronic kidney disease, stage 4 (severe): Secondary | ICD-10-CM | POA: Diagnosis not present

## 2020-12-13 ENCOUNTER — Encounter: Payer: Self-pay | Admitting: Adult Health

## 2021-01-03 ENCOUNTER — Other Ambulatory Visit: Payer: Self-pay | Admitting: Internal Medicine

## 2021-01-07 ENCOUNTER — Ambulatory Visit (INDEPENDENT_AMBULATORY_CARE_PROVIDER_SITE_OTHER): Payer: Medicare Other

## 2021-01-07 DIAGNOSIS — I495 Sick sinus syndrome: Secondary | ICD-10-CM

## 2021-01-07 LAB — CUP PACEART REMOTE DEVICE CHECK
Battery Impedance: 284 Ohm
Battery Remaining Longevity: 107 mo
Battery Voltage: 2.79 V
Brady Statistic AP VP Percent: 1 %
Brady Statistic AP VS Percent: 96 %
Brady Statistic AS VP Percent: 0 %
Brady Statistic AS VS Percent: 3 %
Date Time Interrogation Session: 20220808101438
Implantable Lead Implant Date: 20171020
Implantable Lead Implant Date: 20171020
Implantable Lead Location: 753859
Implantable Lead Location: 753860
Implantable Lead Model: 5076
Implantable Lead Model: 5076
Implantable Pulse Generator Implant Date: 20171020
Lead Channel Impedance Value: 454 Ohm
Lead Channel Impedance Value: 586 Ohm
Lead Channel Pacing Threshold Amplitude: 0.5 V
Lead Channel Pacing Threshold Amplitude: 0.75 V
Lead Channel Pacing Threshold Pulse Width: 0.4 ms
Lead Channel Pacing Threshold Pulse Width: 0.4 ms
Lead Channel Setting Pacing Amplitude: 2 V
Lead Channel Setting Pacing Amplitude: 2.5 V
Lead Channel Setting Pacing Pulse Width: 0.4 ms
Lead Channel Setting Sensing Sensitivity: 4 mV

## 2021-01-30 NOTE — Progress Notes (Signed)
Remote pacemaker transmission.   

## 2021-03-15 ENCOUNTER — Other Ambulatory Visit: Payer: Self-pay | Admitting: Cardiovascular Disease

## 2021-03-15 DIAGNOSIS — I480 Paroxysmal atrial fibrillation: Secondary | ICD-10-CM

## 2021-03-15 NOTE — Telephone Encounter (Signed)
Prescription refill request for Eliquis received. Indication:Afib Last office visit:4/22 Scr:1.9 Age: 86 Weight:62 kg  Prescription refilled

## 2021-03-20 DIAGNOSIS — H401233 Low-tension glaucoma, bilateral, severe stage: Secondary | ICD-10-CM | POA: Diagnosis not present

## 2021-03-20 DIAGNOSIS — H26493 Other secondary cataract, bilateral: Secondary | ICD-10-CM | POA: Diagnosis not present

## 2021-03-26 ENCOUNTER — Encounter: Payer: Self-pay | Admitting: Internal Medicine

## 2021-03-26 ENCOUNTER — Ambulatory Visit: Payer: Medicare Other | Admitting: Internal Medicine

## 2021-03-26 ENCOUNTER — Other Ambulatory Visit: Payer: Self-pay

## 2021-03-26 VITALS — BP 153/80 | HR 66 | Temp 97.4°F | Resp 16 | Ht 67.0 in | Wt 133.2 lb

## 2021-03-26 DIAGNOSIS — T826XXD Infection and inflammatory reaction due to cardiac valve prosthesis, subsequent encounter: Secondary | ICD-10-CM | POA: Diagnosis not present

## 2021-03-26 DIAGNOSIS — I38 Endocarditis, valve unspecified: Secondary | ICD-10-CM | POA: Diagnosis not present

## 2021-03-26 DIAGNOSIS — M549 Dorsalgia, unspecified: Secondary | ICD-10-CM | POA: Diagnosis not present

## 2021-03-26 DIAGNOSIS — Z5181 Encounter for therapeutic drug level monitoring: Secondary | ICD-10-CM | POA: Diagnosis not present

## 2021-03-26 MED ORDER — AMOXICILLIN 500 MG PO TABS: ORAL_TABLET | ORAL | 11 refills | Status: DC

## 2021-03-26 NOTE — Assessment & Plan Note (Signed)
Recent creat stable and WBC wnl.

## 2021-03-26 NOTE — Assessment & Plan Note (Signed)
He continues to do well on suppression with amoxicillin and with good tolerance and no issues, will have him continue this indefinitely.  Refills given for 1 year.  He can rtc in 1 year.

## 2021-03-26 NOTE — Assessment & Plan Note (Signed)
Continues to have back pain but no new issues and no new concerns for infection.

## 2021-03-26 NOTE — Progress Notes (Signed)
   Subjective:    Patient ID: Ryan [name], male    DOB: [birth year], 87 y.o.   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  He continues on amoxicillin suppression with no missed doses.  No issues with rash or diarrhea.  Good tolerance of the medication.  Here with his wife.  No complaints other than ongoing back and bilateral knee pain.    Review of Systems  Constitutional:  Negative for chills and fever.  Gastrointestinal:  Negative for diarrhea.  Skin:  Negative for rash.      Objective:   Physical Exam Eyes:     General: No scleral icterus. Cardiovascular:     Rate and Rhythm: Normal rate and regular rhythm.     Heart sounds: No murmur heard. Pulmonary:     Effort: Pulmonary effort is normal.  Neurological:     General: No focal deficit present.     Mental Status: He is alert.  Psychiatric:        Mood and Affect: Mood normal.   SH: no tobacco        Assessment & Plan:

## 2021-04-03 ENCOUNTER — Other Ambulatory Visit: Payer: Self-pay | Admitting: Internal Medicine

## 2021-04-08 ENCOUNTER — Ambulatory Visit (INDEPENDENT_AMBULATORY_CARE_PROVIDER_SITE_OTHER): Payer: Medicare Other

## 2021-04-08 DIAGNOSIS — I495 Sick sinus syndrome: Secondary | ICD-10-CM

## 2021-04-08 LAB — CUP PACEART REMOTE DEVICE CHECK
Battery Impedance: 308 Ohm
Battery Remaining Longevity: 105 mo
Battery Voltage: 2.78 V
Brady Statistic AP VP Percent: 1 %
Brady Statistic AP VS Percent: 96 %
Brady Statistic AS VP Percent: 0 %
Brady Statistic AS VS Percent: 3 %
Date Time Interrogation Session: 20221107090724
Implantable Lead Implant Date: 20171020
Implantable Lead Implant Date: 20171020
Implantable Lead Location: 753859
Implantable Lead Location: 753860
Implantable Lead Model: 5076
Implantable Lead Model: 5076
Implantable Pulse Generator Implant Date: 20171020
Lead Channel Impedance Value: 453 Ohm
Lead Channel Impedance Value: 584 Ohm
Lead Channel Pacing Threshold Amplitude: 0.5 V
Lead Channel Pacing Threshold Amplitude: 0.875 V
Lead Channel Pacing Threshold Pulse Width: 0.4 ms
Lead Channel Pacing Threshold Pulse Width: 0.4 ms
Lead Channel Setting Pacing Amplitude: 2 V
Lead Channel Setting Pacing Amplitude: 2.5 V
Lead Channel Setting Pacing Pulse Width: 0.4 ms
Lead Channel Setting Sensing Sensitivity: 4 mV

## 2021-04-11 NOTE — Progress Notes (Signed)
Remote pacemaker transmission.   

## 2021-05-01 DIAGNOSIS — H524 Presbyopia: Secondary | ICD-10-CM | POA: Diagnosis not present

## 2021-05-01 DIAGNOSIS — H18893 Other specified disorders of cornea, bilateral: Secondary | ICD-10-CM | POA: Diagnosis not present

## 2021-05-01 DIAGNOSIS — H401233 Low-tension glaucoma, bilateral, severe stage: Secondary | ICD-10-CM | POA: Diagnosis not present

## 2021-05-02 DIAGNOSIS — L570 Actinic keratosis: Secondary | ICD-10-CM | POA: Diagnosis not present

## 2021-05-02 DIAGNOSIS — Z85828 Personal history of other malignant neoplasm of skin: Secondary | ICD-10-CM | POA: Diagnosis not present

## 2021-05-02 DIAGNOSIS — L298 Other pruritus: Secondary | ICD-10-CM | POA: Diagnosis not present

## 2021-05-02 DIAGNOSIS — B078 Other viral warts: Secondary | ICD-10-CM | POA: Diagnosis not present

## 2021-05-08 ENCOUNTER — Other Ambulatory Visit: Payer: Self-pay

## 2021-05-08 MED ORDER — AMLODIPINE BESYLATE 5 MG PO TABS
ORAL_TABLET | ORAL | 1 refills | Status: DC
Start: 2021-05-08 — End: 2021-08-29

## 2021-05-08 NOTE — Telephone Encounter (Signed)
This is Dr. Croitoru's pt 

## 2021-05-28 DIAGNOSIS — N184 Chronic kidney disease, stage 4 (severe): Secondary | ICD-10-CM | POA: Diagnosis not present

## 2021-06-06 DIAGNOSIS — D631 Anemia in chronic kidney disease: Secondary | ICD-10-CM | POA: Diagnosis not present

## 2021-06-06 DIAGNOSIS — I129 Hypertensive chronic kidney disease with stage 1 through stage 4 chronic kidney disease, or unspecified chronic kidney disease: Secondary | ICD-10-CM | POA: Diagnosis not present

## 2021-06-06 DIAGNOSIS — N184 Chronic kidney disease, stage 4 (severe): Secondary | ICD-10-CM | POA: Diagnosis not present

## 2021-06-06 DIAGNOSIS — N2581 Secondary hyperparathyroidism of renal origin: Secondary | ICD-10-CM | POA: Diagnosis not present

## 2021-06-18 ENCOUNTER — Other Ambulatory Visit: Payer: Self-pay | Admitting: Adult Health

## 2021-06-18 MED ORDER — ALBUTEROL SULFATE HFA 108 (90 BASE) MCG/ACT IN AERS: 2.0000 | INHALATION_SPRAY | Freq: Four times a day (QID) | RESPIRATORY_TRACT | 2 refills | Status: AC | PRN

## 2021-07-08 ENCOUNTER — Ambulatory Visit: Payer: Medicare Other

## 2021-07-08 DIAGNOSIS — I495 Sick sinus syndrome: Secondary | ICD-10-CM

## 2021-07-09 LAB — CUP PACEART REMOTE DEVICE CHECK
Battery Impedance: 333 Ohm
Battery Remaining Longevity: 101 mo
Battery Voltage: 2.79 V
Brady Statistic AP VP Percent: 1 %
Brady Statistic AP VS Percent: 96 %
Brady Statistic AS VP Percent: 0 %
Brady Statistic AS VS Percent: 3 %
Date Time Interrogation Session: 20230206133427
Implantable Lead Implant Date: 20171020
Implantable Lead Implant Date: 20171020
Implantable Lead Location: 753859
Implantable Lead Location: 753860
Implantable Lead Model: 5076
Implantable Lead Model: 5076
Implantable Pulse Generator Implant Date: 20171020
Lead Channel Impedance Value: 447 Ohm
Lead Channel Impedance Value: 584 Ohm
Lead Channel Pacing Threshold Amplitude: 0.5 V
Lead Channel Pacing Threshold Amplitude: 0.75 V
Lead Channel Pacing Threshold Pulse Width: 0.4 ms
Lead Channel Pacing Threshold Pulse Width: 0.4 ms
Lead Channel Setting Pacing Amplitude: 2 V
Lead Channel Setting Pacing Amplitude: 2.5 V
Lead Channel Setting Pacing Pulse Width: 0.4 ms
Lead Channel Setting Sensing Sensitivity: 4 mV

## 2021-07-15 DIAGNOSIS — C671 Malignant neoplasm of dome of bladder: Secondary | ICD-10-CM | POA: Diagnosis not present

## 2021-07-15 DIAGNOSIS — R351 Nocturia: Secondary | ICD-10-CM | POA: Diagnosis not present

## 2021-07-15 DIAGNOSIS — C61 Malignant neoplasm of prostate: Secondary | ICD-10-CM | POA: Diagnosis not present

## 2021-07-18 DIAGNOSIS — H0101B Ulcerative blepharitis left eye, upper and lower eyelids: Secondary | ICD-10-CM | POA: Diagnosis not present

## 2021-07-25 DIAGNOSIS — H0101B Ulcerative blepharitis left eye, upper and lower eyelids: Secondary | ICD-10-CM | POA: Diagnosis not present

## 2021-07-25 DIAGNOSIS — H18893 Other specified disorders of cornea, bilateral: Secondary | ICD-10-CM | POA: Diagnosis not present

## 2021-07-26 ENCOUNTER — Ambulatory Visit (INDEPENDENT_AMBULATORY_CARE_PROVIDER_SITE_OTHER): Payer: Medicare Other | Admitting: Adult Health

## 2021-07-26 ENCOUNTER — Other Ambulatory Visit: Payer: Self-pay | Admitting: Adult Health

## 2021-07-26 ENCOUNTER — Encounter: Payer: Self-pay | Admitting: Adult Health

## 2021-07-26 VITALS — BP 120/80 | HR 66 | Temp 97.7°F | Ht 67.0 in | Wt 132.0 lb

## 2021-07-26 DIAGNOSIS — H0500 Unspecified acute inflammation of orbit: Secondary | ICD-10-CM

## 2021-07-26 MED ORDER — CEFTRIAXONE SODIUM 1 G IJ SOLR
1.0000 g | Freq: Once | INTRAMUSCULAR | Status: AC
  Administered 2021-07-26: 1 g via INTRAMUSCULAR

## 2021-07-26 MED ORDER — METHYLPREDNISOLONE ACETATE 40 MG/ML IJ SUSP
40.0000 mg | Freq: Once | INTRAMUSCULAR | Status: AC
  Administered 2021-07-26: 40 mg via INTRAMUSCULAR

## 2021-07-26 MED ORDER — PREDNISONE 20 MG PO TABS: 20.0000 mg | ORAL_TABLET | Freq: Every day | ORAL | 0 refills | Status: DC

## 2021-07-26 MED ORDER — METHYLPREDNISOLONE ACETATE 80 MG/ML IJ SUSP: 120.0000 mg | Freq: Once | INTRAMUSCULAR | Status: DC

## 2021-07-26 MED ORDER — METHYLPREDNISOLONE ACETATE 80 MG/ML IJ SUSP
80.0000 mg | Freq: Once | INTRAMUSCULAR | Status: AC
  Administered 2021-07-26: 80 mg via INTRA_ARTICULAR

## 2021-07-26 NOTE — Addendum Note (Signed)
Addended by: Apolinar Junes on: 07/26/2021 02:23 PM   Modules accepted: Orders

## 2021-07-26 NOTE — Progress Notes (Signed)
Subjective:    Patient ID: Ryan [name], male    DOB: 30-Mar-1930, 88 y.o.   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), Anemia (12/2017), Arthritis, Asthma, Bacterial endocarditis, Carotid artery obstruction, Chronic combined systolic (congestive) and diastolic (congestive) heart failure (HCC) (03/18/2016), Chronic lower back pain, CKD (chronic kidney disease) stage 4, GFR 15-29 ml/min (HCC) (12/2017), Claudication in peripheral vascular disease ([place]) (01/01/2018), Coronary arteriosclerosis in native artery, Decreased diffusion capacity (10/23/2017), Diverticulitis of colon, DJD (degenerative joint disease), Elevated TSH (06/30/2017), GERD (gastroesophageal reflux disease), History of hiatal hernia, HLD (hyperlipidemia), Hypertension, Hypertensive heart disease with heart failure ([place]), Intractable back pain (11/07/2017), OSA (obstructive sleep apnea), PAF (paroxysmal atrial fibrillation) ([place]), Paroxysmal supraventricular tachycardia ([place]), Presence of permanent cardiac pacemaker, Primary malignant neoplasm of bladder ([place]), Prostate cancer ([place]), 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  He presents to the office today for an acute issue.  Over the last few weeks he has developed redness and edema around bilateral eyes.  He has been seen by his ophthalmologist multiple times for this issue in the past couple weeks.  There was concern for bacterial infection on his eyelids and was prescribed azithromycin ointment to apply.  He was seen yesterday at the eye doctor who did a complete eye exam did not see anything in the eye itself but did describe doxycycline 100 mg twice daily for possible cellulitis.  He reports that his symptoms seem to  coincide with seasonal allergy-like symptoms.  His wife who is with him today does endorse some slight improvement in his symptoms   Review of Systems See HPI   Past Medical History:  Diagnosis Date   Age-related macular degeneration    Allergic rhinitis    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  iron deficient, ferritin 17 04/2018   Arthritis    "hands, lower back" (04/30/2016)   Asthma    Bacterial endocarditis    Carotid artery obstruction    a. s/p bilat CEA.   Chronic combined systolic (congestive) and diastolic (congestive) heart failure (HCC) 03/18/2016   Chronic lower back pain    CKD (chronic kidney disease) stage 4, GFR 15-29 ml/min (HCC) 12/2017   Claudication in peripheral vascular disease ([place]) 01/01/2018   Claudication   Coronary arteriosclerosis in native artery    a. 2017 Cardiac catheterization demonstrated worsening CAD with 70% mid LCx, 80% OM1 and 80% mid RCA stenosis. b.  In 11/17, he underwent successful rotational atherectomy and DES to RCA.   Decreased diffusion capacity 10/23/2017   Diverticulitis of colon    DJD (degenerative joint disease)    Elevated TSH 06/30/2017   GERD (gastroesophageal reflux disease)    History of hiatal hernia    HLD (hyperlipidemia)    Hypertension    Hypertensive heart disease with heart failure (HCC)    Intractable back pain 11/07/2017   OSA (obstructive sleep apnea)    intolerant to CPAP   PAF (paroxysmal atrial fibrillation) (HCC)    Paroxysmal supraventricular tachycardia (HCC)    Presence of permanent cardiac pacemaker    Primary malignant neoplasm of bladder [facility])    Prostate cancer ([place])    Prosthetic valve endocarditis ([place]) 11/10/2017   enterococcal bacteremia   S/P AVR 12/02/2017   Syncope 10/23/2017   Tachy-brady syndrome (  [place])    Thrombocytopenia ([place]) 11/07/2017   Urinary retention 11/07/2017    Social History   Socioeconomic History   Marital status: Married    Spouse name:  Not on file   Number of children: Not on file   Years of education: Not on file   Highest education level: Not on file  Occupational History   Occupation: retired  Tobacco Use   Smoking status: Never   Smokeless tobacco: Never  Vaping Use   Vaping Use: Never used  Substance and Sexual Activity   Alcohol use: No    Alcohol/week: 0.0 standard drinks    Comment: 04/30/2016 "nothing in years"   Drug use: No   Sexual activity: Never  Other Topics Concern   Not on file  Social History Narrative   Retired    Three children - One in [place], One in [place], and one in [place].       Social Determinants of Health   Financial Resource Strain: Low Risk    Difficulty of Paying Living Expenses: Not hard at all  Food Insecurity: Not on file  Transportation Needs: No Transportation Needs   Lack of Transportation (Medical): No   Lack of Transportation (Non-Medical): No  Physical Activity: Inactive   Days of Exercise per Week: 0 days   Minutes of Exercise per Session: 0 min  Stress: No Stress Concern Present   Feeling of Stress : Not at all  Social Connections: Moderately Integrated   Frequency of Communication with Friends and Family: More than three times a week   Frequency of Social Gatherings with Friends and Family: More than three times a week   Attends Religious Services: 1 to 4 times per year   Active Member of [employer] or Organizations: No   Attends Archivist Meetings: Never   Marital Status: Married  Human resources officer Violence: Not on file    Past Surgical History:  Procedure Laterality Date   APPENDECTOMY     CARDIAC CATHETERIZATION N/A 01/17/2016   Procedure: Right/Left Heart Cath and Coronary Angiography;  Surgeon: Belva Crome, MD;  Location: [place] [facility];  Service: Cardiovascular;  Laterality: N/A;   CARDIAC CATHETERIZATION N/A 04/30/2016   Procedure: Coronary/Graft Atherectomy;  Surgeon: Sherren Mocha, MD;  Location: [place] [facility];  Service:  Cardiovascular;  Laterality: N/A;   CAROTID ENDARTERECTOMY Bilateral    CATARACT EXTRACTION W/ INTRAOCULAR LENS  IMPLANT, BILATERAL Bilateral    COLON SURGERY  1981   Meckles Diverticulum with volvulus   COLONOSCOPY WITH PROPOFOL N/A 05/01/2018   Procedure: COLONOSCOPY WITH PROPOFOL;  Surgeon: Rush Landmark Telford Nab., MD;  Location: [place];  Service: Gastroenterology;  Laterality: N/A;   CORONARY ANGIOGRAPHY N/A 01/18/2018   Procedure: CORONARY ANGIOGRAPHY;  Surgeon: Lorretta Harp, MD;  Location: [place] [facility];  Service: Cardiovascular;  Laterality: N/A;   ENTEROSCOPY N/A 04/30/2018   Procedure: ENTEROSCOPY;  Surgeon: Rush Landmark Telford Nab., MD;  Location: [place];  Service: Gastroenterology;  Laterality: N/A;   EP IMPLANTABLE DEVICE N/A 03/21/2016   Procedure: Pacemaker Implant;  Surgeon: Evans Lance, MD;  Location: [place] [facility];  Service: Cardiovascular;  Laterality: N/A;   EYE SURGERY     INGUINAL HERNIA REPAIR Right 2009   INSERT / REPLACE / REMOVE PACEMAKER     INSERTION PROSTATE RADIATION SEED     POLYPECTOMY  05/01/2018   Procedure: POLYPECTOMY;  Surgeon: Rush Landmark Telford Nab., MD;  Location: [place];  Service: Gastroenterology;;   TEE WITHOUT  CARDIOVERSION N/A 06/10/2016   Procedure: TRANSESOPHAGEAL ECHOCARDIOGRAM (TEE);  Surgeon: Sherren Mocha, MD;  Location: [place];  Service: Open Heart Surgery;  Laterality: N/A;   TEE WITHOUT CARDIOVERSION N/A 11/10/2017   Procedure: TRANSESOPHAGEAL ECHOCARDIOGRAM (TEE);  Surgeon: Acie Fredrickson Wonda Cheng, MD;  Location: [facility] ENDOSCOPY;  Service: Cardiovascular;  Laterality: N/A;   TEE WITHOUT CARDIOVERSION N/A 12/15/2017   Procedure: TRANSESOPHAGEAL ECHOCARDIOGRAM (TEE);  Surgeon: Buford Dresser, MD;  Location: [facility] ENDOSCOPY;  Service: Cardiovascular;  Laterality: N/A;   TEE WITHOUT CARDIOVERSION N/A 03/30/2018   Procedure: TRANSESOPHAGEAL ECHOCARDIOGRAM (TEE);  Surgeon: Jerline Pain, MD;  Location: [facility]COPY;   Service: Cardiovascular;  Laterality: N/A;   TONSILLECTOMY  ~ 1947   TRANSCATHETER AORTIC VALVE REPLACEMENT, TRANSFEMORAL N/A 06/10/2016   Procedure: TRANSCATHETER AORTIC VALVE REPLACEMENT, TRANSFEMORAL;  Surgeon: Sherren Mocha, MD;  Location: [place];  Service: Open Heart Surgery;  Laterality: N/A;    Family History  Problem Relation Age of Onset   Heart disease Mother    Heart disease Father     Allergies  Allergen Reactions   Ciprofloxacin Rash   Hydrochlorothiazide Rash   Sulfa Antibiotics Anaphylaxis   Ace Inhibitors Cough   Losartan Cough   Carvedilol Cough    Pt states it "causes him too cough."   Lisinopril Cough    Pt reports worsening cough and "feeling wheezy."   Soy Allergy Nausea And Vomiting    Current Outpatient Medications on File Prior to Visit  Medication Sig Dispense Refill   acetaminophen (TYLENOL) 500 MG tablet Take 500 mg by mouth daily.     acetaminophen-codeine (TYLENOL #3) 300-30 MG tablet Take 1 tablet by mouth every 8 (eight) hours as needed for moderate pain. 10 tablet 0   albuterol (VENTOLIN HFA) 108 (90 Base) MCG/ACT inhaler Inhale 2 puffs into the lungs every 6 (six) hours as needed for wheezing or shortness of breath. 8 g 2   amLODipine (NORVASC) 5 MG tablet TAKE 1 TABLET(5 MG) BY MOUTH DAILY 90 tablet 1   amoxicillin (AMOXIL) 500 MG tablet TAKE 1 TABLET(500 MG) BY MOUTH TWICE DAILY 60 tablet 11   atorvastatin (LIPITOR) 40 MG tablet TAKE 1 TABLET(40 MG) BY MOUTH DAILY AT 6 PM 90 tablet 3   cholecalciferol (VITAMIN D) 1000 units tablet Take 1,000 Units by mouth 2 (two) times a week. Monday and Thursday     ELIQUIS 2.5 MG TABS tablet TAKE 1 TABLET(2.5 MG) BY MOUTH TWICE DAILY 180 tablet 1   furosemide (LASIX) 20 MG tablet Take 1 tablet (20 mg total) by mouth daily as needed for fluid or edema. 30 tablet 3   isosorbide mononitrate (IMDUR) 30 MG 24 hr tablet Take 1 tablet (30 mg total) by mouth daily. 90 tablet 3   latanoprost (XALATAN) 0.005 %  ophthalmic solution Place 1 drop into both eyes at bedtime.     metoprolol succinate (TOPROL-XL) 100 MG 24 hr tablet Take 1 tablet (100 mg total) by mouth daily. Take with or immediately following a meal. 90 tablet 3   nitroGLYCERIN (NITROSTAT) 0.4 MG SL tablet Place 1 tablet (0.4 mg total) under the tongue every 5 (five) minutes x 3 doses as needed for chest pain. 25 tablet 3   vitamin B-12 (CYANOCOBALAMIN) 1000 MCG tablet Take 1,000 mcg by mouth daily.     No current facility-administered medications on file prior to visit.    There were no vitals taken for this visit.      Objective:   Physical Exam Vitals and  nursing note reviewed.  Constitutional:      Appearance: Normal appearance.  HENT:     Head:      Comments: His orbits are quite erythematous.  Does have dry scaly skin on both upper eyelids.  Has a stye on his left lower lid.  Noticeable swelling especially around his left orbit and left forehead.  He has minor serous fluid noted draining from his right forehead, this appears to be from edema.  No signs of shingles noted. Cardiovascular:     Rate and Rhythm: Normal rate and regular rhythm.     Pulses: Normal pulses.     Heart sounds: Normal heart sounds.  Pulmonary:     Effort: Pulmonary effort is normal.     Breath sounds: Normal breath sounds.  Skin:    Capillary Refill: Capillary refill takes less than 2 seconds.  Neurological:     General: No focal deficit present.     Mental Status: He is alert and oriented to person, place, and time.  Psychiatric:        Mood and Affect: Mood normal.        Behavior: Behavior normal.        Thought Content: Thought content normal.        Judgment: Judgment normal.      Assessment & Plan:  1. Orbital inflammation, acute -Possibly related to allergies in combination with cellulitis.  We will give him a shot of Rocephin in the office today as well as injection of Depo-Medrol.  Will prescribe Depo-Medrol daily starting tomorrow  for 7 days.  Continue with doxycycline until finished.  If symptoms do not seem to be improving family will bring him back next week - cefTRIAXone (ROCEPHIN) injection 1 g - Body fluid culture - predniSONE (DELTASONE) 20 MG tablet; Take 1 tablet (20 mg total) by mouth daily with breakfast.  Dispense: 7 tablet; Refill: 0 - methylPREDNISolone acetate (DEPO-MEDROL) injection 80 mg - methylPREDNISolone acetate (DEPO-MEDROL) injection 40 mg  Dorothyann Peng, NP

## 2021-07-26 NOTE — Patient Instructions (Addendum)
It was great seeing you today   I gave you a injection of an antibiotic called Rocephin and an injection of steroids   I also took a culture of the drainage   I have sent in a prescription for prednisone ( steroid) to start tomorrow - take this for 7 days

## 2021-07-27 ENCOUNTER — Encounter (HOSPITAL_BASED_OUTPATIENT_CLINIC_OR_DEPARTMENT_OTHER): Payer: Self-pay | Admitting: Emergency Medicine

## 2021-07-27 ENCOUNTER — Emergency Department (HOSPITAL_BASED_OUTPATIENT_CLINIC_OR_DEPARTMENT_OTHER): Payer: Medicare Other

## 2021-07-27 ENCOUNTER — Other Ambulatory Visit: Payer: Self-pay

## 2021-07-27 ENCOUNTER — Inpatient Hospital Stay (HOSPITAL_BASED_OUTPATIENT_CLINIC_OR_DEPARTMENT_OTHER)
Admission: EM | Admit: 2021-07-27 | Discharge: 2021-07-29 | DRG: 125 | Disposition: A | Discharge status: Home / Self Care | Payer: Medicare Other | Attending: Internal Medicine | Admitting: Internal Medicine

## 2021-07-27 DIAGNOSIS — R7303 Prediabetes: Secondary | ICD-10-CM | POA: Diagnosis present

## 2021-07-27 DIAGNOSIS — R220 Localized swelling, mass and lump, head: Secondary | ICD-10-CM | POA: Diagnosis not present

## 2021-07-27 DIAGNOSIS — N184 Chronic kidney disease, stage 4 (severe): Secondary | ICD-10-CM | POA: Diagnosis not present

## 2021-07-27 DIAGNOSIS — I495 Sick sinus syndrome: Secondary | ICD-10-CM | POA: Diagnosis not present

## 2021-07-27 DIAGNOSIS — H3530 Unspecified macular degeneration: Secondary | ICD-10-CM | POA: Diagnosis present

## 2021-07-27 DIAGNOSIS — I480 Paroxysmal atrial fibrillation: Secondary | ICD-10-CM | POA: Diagnosis present

## 2021-07-27 DIAGNOSIS — Z950 Presence of cardiac pacemaker: Secondary | ICD-10-CM | POA: Diagnosis not present

## 2021-07-27 DIAGNOSIS — D696 Thrombocytopenia, unspecified: Secondary | ICD-10-CM | POA: Diagnosis present

## 2021-07-27 DIAGNOSIS — L03213 Periorbital cellulitis: Secondary | ICD-10-CM | POA: Diagnosis not present

## 2021-07-27 DIAGNOSIS — Z20822 Contact with and (suspected) exposure to covid-19: Secondary | ICD-10-CM | POA: Diagnosis not present

## 2021-07-27 DIAGNOSIS — Z8546 Personal history of malignant neoplasm of prostate: Secondary | ICD-10-CM

## 2021-07-27 DIAGNOSIS — B0230 Zoster ocular disease, unspecified: Secondary | ICD-10-CM | POA: Diagnosis not present

## 2021-07-27 DIAGNOSIS — H0500 Unspecified acute inflammation of orbit: Secondary | ICD-10-CM | POA: Diagnosis not present

## 2021-07-27 DIAGNOSIS — Z952 Presence of prosthetic heart valve: Secondary | ICD-10-CM | POA: Diagnosis not present

## 2021-07-27 DIAGNOSIS — I10 Essential (primary) hypertension: Secondary | ICD-10-CM | POA: Diagnosis not present

## 2021-07-27 DIAGNOSIS — E039 Hypothyroidism, unspecified: Secondary | ICD-10-CM | POA: Diagnosis not present

## 2021-07-27 DIAGNOSIS — N1832 Chronic kidney disease, stage 3b: Secondary | ICD-10-CM | POA: Diagnosis present

## 2021-07-27 DIAGNOSIS — R739 Hyperglycemia, unspecified: Secondary | ICD-10-CM

## 2021-07-27 DIAGNOSIS — E7800 Pure hypercholesterolemia, unspecified: Secondary | ICD-10-CM | POA: Diagnosis present

## 2021-07-27 DIAGNOSIS — Z8551 Personal history of malignant neoplasm of bladder: Secondary | ICD-10-CM

## 2021-07-27 DIAGNOSIS — Z881 Allergy status to other antibiotic agents status: Secondary | ICD-10-CM

## 2021-07-27 DIAGNOSIS — D849 Immunodeficiency, unspecified: Secondary | ICD-10-CM | POA: Diagnosis not present

## 2021-07-27 DIAGNOSIS — N183 Chronic kidney disease, stage 3 unspecified: Secondary | ICD-10-CM | POA: Diagnosis not present

## 2021-07-27 DIAGNOSIS — E785 Hyperlipidemia, unspecified: Secondary | ICD-10-CM | POA: Diagnosis present

## 2021-07-27 DIAGNOSIS — I5042 Chronic combined systolic (congestive) and diastolic (congestive) heart failure: Secondary | ICD-10-CM | POA: Diagnosis not present

## 2021-07-27 DIAGNOSIS — J45909 Unspecified asthma, uncomplicated: Secondary | ICD-10-CM | POA: Diagnosis not present

## 2021-07-27 DIAGNOSIS — Z888 Allergy status to other drugs, medicaments and biological substances status: Secondary | ICD-10-CM

## 2021-07-27 DIAGNOSIS — Z7901 Long term (current) use of anticoagulants: Secondary | ICD-10-CM

## 2021-07-27 DIAGNOSIS — I252 Old myocardial infarction: Secondary | ICD-10-CM

## 2021-07-27 DIAGNOSIS — H5789 Other specified disorders of eye and adnexa: Secondary | ICD-10-CM | POA: Diagnosis not present

## 2021-07-27 DIAGNOSIS — Z882 Allergy status to sulfonamides status: Secondary | ICD-10-CM

## 2021-07-27 DIAGNOSIS — I25118 Atherosclerotic heart disease of native coronary artery with other forms of angina pectoris: Secondary | ICD-10-CM | POA: Diagnosis present

## 2021-07-27 DIAGNOSIS — I130 Hypertensive heart and chronic kidney disease with heart failure and stage 1 through stage 4 chronic kidney disease, or unspecified chronic kidney disease: Secondary | ICD-10-CM | POA: Diagnosis present

## 2021-07-27 DIAGNOSIS — R443 Hallucinations, unspecified: Secondary | ICD-10-CM | POA: Diagnosis not present

## 2021-07-27 DIAGNOSIS — Z79899 Other long term (current) drug therapy: Secondary | ICD-10-CM

## 2021-07-27 DIAGNOSIS — Z8249 Family history of ischemic heart disease and other diseases of the circulatory system: Secondary | ICD-10-CM

## 2021-07-27 DIAGNOSIS — B0239 Other herpes zoster eye disease: Secondary | ICD-10-CM | POA: Diagnosis not present

## 2021-07-27 DIAGNOSIS — B0052 Herpesviral keratitis: Secondary | ICD-10-CM | POA: Diagnosis not present

## 2021-07-27 DIAGNOSIS — K219 Gastro-esophageal reflux disease without esophagitis: Secondary | ICD-10-CM | POA: Diagnosis present

## 2021-07-27 DIAGNOSIS — H539 Unspecified visual disturbance: Secondary | ICD-10-CM

## 2021-07-27 DIAGNOSIS — G4733 Obstructive sleep apnea (adult) (pediatric): Secondary | ICD-10-CM | POA: Diagnosis present

## 2021-07-27 LAB — CBC WITH DIFFERENTIAL/PLATELET
Abs Immature Granulocytes: 0.07 10*3/uL (ref 0.00–0.07)
Basophils Absolute: 0 10*3/uL (ref 0.0–0.1)
Basophils Relative: 1 %
Eosinophils Absolute: 0 10*3/uL (ref 0.0–0.5)
Eosinophils Relative: 0 %
HCT: 48.1 % (ref 39.0–52.0)
Hemoglobin: 15.6 g/dL (ref 13.0–17.0)
Immature Granulocytes: 1 %
Lymphocytes Relative: 14 %
Lymphs Abs: 0.8 10*3/uL (ref 0.7–4.0)
MCH: 31 pg (ref 26.0–34.0)
MCHC: 32.4 g/dL (ref 30.0–36.0)
MCV: 95.4 fL (ref 80.0–100.0)
Monocytes Absolute: 0.8 10*3/uL (ref 0.1–1.0)
Monocytes Relative: 13 %
Neutro Abs: 4.2 10*3/uL (ref 1.7–7.7)
Neutrophils Relative %: 71 %
Platelets: 157 10*3/uL (ref 150–400)
RBC: 5.04 MIL/uL (ref 4.22–5.81)
RDW: 13 % (ref 11.5–15.5)
WBC: 5.9 10*3/uL (ref 4.0–10.5)
nRBC: 0 % (ref 0.0–0.2)

## 2021-07-27 LAB — COMPREHENSIVE METABOLIC PANEL
ALT: 14 U/L (ref 0–44)
AST: 24 U/L (ref 15–41)
Albumin: 4.2 g/dL (ref 3.5–5.0)
Alkaline Phosphatase: 73 U/L (ref 38–126)
Anion gap: 9 (ref 5–15)
BUN: 35 mg/dL — ABNORMAL HIGH (ref 8–23)
CO2: 27 mmol/L (ref 22–32)
Calcium: 9.8 mg/dL (ref 8.9–10.3)
Chloride: 102 mmol/L (ref 98–111)
Creatinine, Ser: 1.8 mg/dL — ABNORMAL HIGH (ref 0.61–1.24)
GFR, Estimated: 36 mL/min — ABNORMAL LOW (ref >60)
Glucose, Bld: 116 mg/dL — ABNORMAL HIGH (ref 70–99)
Potassium: 4.3 mmol/L (ref 3.5–5.1)
Sodium: 138 mmol/L (ref 135–145)
Total Bilirubin: 0.5 mg/dL (ref 0.3–1.2)
Total Protein: 6.3 g/dL — ABNORMAL LOW (ref 6.5–8.1)

## 2021-07-27 LAB — TSH: TSH: 4.155 u[IU]/mL (ref 0.350–4.500)

## 2021-07-27 LAB — RESP PANEL BY RT-PCR (FLU A&B, COVID) ARPGX2
Influenza A by PCR: NEGATIVE
Influenza B by PCR: NEGATIVE
SARS Coronavirus 2 by RT PCR: NEGATIVE

## 2021-07-27 MED ORDER — IOHEXOL 300 MG/ML  SOLN
100.0000 mL | Freq: Once | INTRAMUSCULAR | Status: AC | PRN
  Administered 2021-07-27: 50 mL via INTRAVENOUS

## 2021-07-27 MED ORDER — APIXABAN 2.5 MG PO TABS
2.5000 mg | ORAL_TABLET | Freq: Two times a day (BID) | ORAL | Status: DC
Start: 2021-07-27 — End: 2021-07-29
  Administered 2021-07-27 – 2021-07-29 (×4): 2.5 mg via ORAL
  Filled 2021-07-27 (×4): qty 1

## 2021-07-27 MED ORDER — POLYETHYLENE GLYCOL 3350 17 G PO PACK: 17.0000 g | PACK | Freq: Every day | ORAL | Status: DC | PRN

## 2021-07-27 MED ORDER — DIPHENHYDRAMINE HCL 25 MG PO CAPS
25.0000 mg | ORAL_CAPSULE | Freq: Every evening | ORAL | Status: DC | PRN
  Administered 2021-07-28: 25 mg via ORAL
  Filled 2021-07-27: qty 1

## 2021-07-27 MED ORDER — ALBUTEROL SULFATE (2.5 MG/3ML) 0.083% IN NEBU: 2.5000 mg | INHALATION_SOLUTION | Freq: Four times a day (QID) | RESPIRATORY_TRACT | Status: DC | PRN

## 2021-07-27 MED ORDER — TROPICAMIDE 1 % OP SOLN
1.0000 [drp] | OPHTHALMIC | Status: AC
  Administered 2021-07-27 (×3): 1 [drp] via OPHTHALMIC
  Filled 2021-07-27: qty 15

## 2021-07-27 MED ORDER — FENTANYL CITRATE PF 50 MCG/ML IJ SOSY
PREFILLED_SYRINGE | INTRAMUSCULAR | Status: AC
  Filled 2021-07-27: qty 1

## 2021-07-27 MED ORDER — BISACODYL 10 MG RE SUPP: 10.0000 mg | Freq: Every day | RECTAL | Status: DC | PRN

## 2021-07-27 MED ORDER — DOCUSATE SODIUM 100 MG PO CAPS
100.0000 mg | ORAL_CAPSULE | Freq: Two times a day (BID) | ORAL | Status: DC
  Administered 2021-07-28 – 2021-07-29 (×2): 100 mg via ORAL
  Filled 2021-07-27 (×2): qty 1

## 2021-07-27 MED ORDER — AMOXICILLIN 500 MG PO CAPS
500.0000 mg | ORAL_CAPSULE | Freq: Two times a day (BID) | ORAL | Status: DC
  Administered 2021-07-27 – 2021-07-28 (×2): 500 mg via ORAL
  Filled 2021-07-27 (×3): qty 1

## 2021-07-27 MED ORDER — VANCOMYCIN HCL IN DEXTROSE 1-5 GM/200ML-% IV SOLN: 1000.0000 mg | INTRAVENOUS | Status: DC

## 2021-07-27 MED ORDER — FENTANYL CITRATE PF 50 MCG/ML IJ SOSY
50.0000 ug | PREFILLED_SYRINGE | INTRAMUSCULAR | Status: DC | PRN
  Administered 2021-07-27: 50 ug via INTRAVENOUS

## 2021-07-27 MED ORDER — ONDANSETRON HCL 4 MG/2ML IJ SOLN
4.0000 mg | Freq: Four times a day (QID) | INTRAMUSCULAR | Status: DC | PRN
  Administered 2021-07-28: 4 mg via INTRAVENOUS
  Filled 2021-07-27: qty 2

## 2021-07-27 MED ORDER — MORPHINE SULFATE (PF) 4 MG/ML IV SOLN
4.0000 mg | Freq: Once | INTRAVENOUS | Status: AC
  Administered 2021-07-27: 4 mg via INTRAVENOUS
  Filled 2021-07-27: qty 1

## 2021-07-27 MED ORDER — ACETAMINOPHEN 650 MG RE SUPP: 650.0000 mg | Freq: Four times a day (QID) | RECTAL | Status: DC | PRN

## 2021-07-27 MED ORDER — PREDNISOLONE SODIUM PHOSPHATE 15 MG/5ML PO SOLN
1.0000 mg/kg/d | Freq: Every day | ORAL | Status: DC
  Administered 2021-07-27 – 2021-07-28 (×2): 60 mg via ORAL
  Filled 2021-07-27: qty 4
  Filled 2021-07-27 (×2): qty 20

## 2021-07-27 MED ORDER — ISOSORBIDE MONONITRATE ER 30 MG PO TB24
30.0000 mg | ORAL_TABLET | Freq: Every day | ORAL | Status: DC
  Administered 2021-07-28 – 2021-07-29 (×2): 30 mg via ORAL
  Filled 2021-07-27 (×2): qty 1

## 2021-07-27 MED ORDER — FENTANYL CITRATE PF 50 MCG/ML IJ SOSY
50.0000 ug | PREFILLED_SYRINGE | Freq: Once | INTRAMUSCULAR | Status: AC
  Administered 2021-07-27: 50 ug via INTRAVENOUS
  Filled 2021-07-27: qty 1

## 2021-07-27 MED ORDER — METOPROLOL SUCCINATE ER 100 MG PO TB24
100.0000 mg | ORAL_TABLET | Freq: Every day | ORAL | Status: DC
  Administered 2021-07-28 – 2021-07-29 (×2): 100 mg via ORAL
  Filled 2021-07-27 (×2): qty 1

## 2021-07-27 MED ORDER — HYDRALAZINE HCL 20 MG/ML IJ SOLN
10.0000 mg | Freq: Four times a day (QID) | INTRAMUSCULAR | Status: DC | PRN
  Administered 2021-07-28: 10 mg via INTRAVENOUS
  Filled 2021-07-27: qty 1

## 2021-07-27 MED ORDER — MORPHINE SULFATE (PF) 4 MG/ML IV SOLN: 4.0000 mg | Freq: Once | INTRAVENOUS | Status: AC

## 2021-07-27 MED ORDER — PHENYLEPHRINE HCL 2.5 % OP SOLN
1.0000 [drp] | Freq: Once | OPHTHALMIC | Status: AC
  Administered 2021-07-27: 1 [drp] via OPHTHALMIC
  Filled 2021-07-27: qty 2

## 2021-07-27 MED ORDER — ONDANSETRON HCL 4 MG/2ML IJ SOLN
4.0000 mg | Freq: Once | INTRAMUSCULAR | Status: AC
  Administered 2021-07-27: 4 mg via INTRAVENOUS
  Filled 2021-07-27: qty 2

## 2021-07-27 MED ORDER — NITROGLYCERIN 0.4 MG SL SUBL: 0.4000 mg | SUBLINGUAL_TABLET | SUBLINGUAL | Status: DC | PRN

## 2021-07-27 MED ORDER — FENTANYL CITRATE PF 50 MCG/ML IJ SOSY: 12.5000 ug | PREFILLED_SYRINGE | INTRAMUSCULAR | Status: DC | PRN

## 2021-07-27 MED ORDER — ACYCLOVIR SODIUM 50 MG/ML IV SOLN
INTRAVENOUS | Status: AC
  Filled 2021-07-27: qty 10

## 2021-07-27 MED ORDER — SODIUM CHLORIDE 0.9 % IV SOLN
INTRAVENOUS | Status: DC
Start: 2021-07-27 — End: 2021-07-29

## 2021-07-27 MED ORDER — PROPARACAINE HCL 0.5 % OP SOLN
1.0000 [drp] | Freq: Once | OPHTHALMIC | Status: AC
  Administered 2021-07-27: 1 [drp] via OPHTHALMIC
  Filled 2021-07-27: qty 15

## 2021-07-27 MED ORDER — ONDANSETRON HCL 4 MG PO TABS: 4.0000 mg | ORAL_TABLET | Freq: Four times a day (QID) | ORAL | Status: DC | PRN

## 2021-07-27 MED ORDER — MORPHINE SULFATE (PF) 4 MG/ML IV SOLN
INTRAVENOUS | Status: AC
  Administered 2021-07-27: 4 mg via INTRAVENOUS
  Filled 2021-07-27: qty 1

## 2021-07-27 MED ORDER — VITAMIN B-12 1000 MCG PO TABS
1000.0000 ug | ORAL_TABLET | Freq: Every day | ORAL | Status: DC
  Administered 2021-07-28 – 2021-07-29 (×2): 1000 ug via ORAL
  Filled 2021-07-27 (×2): qty 1

## 2021-07-27 MED ORDER — AMLODIPINE BESYLATE 5 MG PO TABS
5.0000 mg | ORAL_TABLET | Freq: Every day | ORAL | Status: DC
  Administered 2021-07-28 – 2021-07-29 (×2): 5 mg via ORAL
  Filled 2021-07-27 (×2): qty 1

## 2021-07-27 MED ORDER — ACYCLOVIR SODIUM 50 MG/ML IV SOLN
10.0000 mg/kg | INTRAVENOUS | Status: DC
Start: 2021-07-27 — End: 2021-07-29
  Administered 2021-07-27 – 2021-07-29 (×3): 600 mg via INTRAVENOUS
  Filled 2021-07-27 (×3): qty 12

## 2021-07-27 MED ORDER — OXYCODONE HCL 5 MG PO TABS
5.0000 mg | ORAL_TABLET | ORAL | Status: DC | PRN
  Administered 2021-07-27 – 2021-07-28 (×4): 5 mg via ORAL
  Filled 2021-07-27 (×4): qty 1

## 2021-07-27 MED ORDER — FUROSEMIDE 20 MG PO TABS: 20.0000 mg | ORAL_TABLET | Freq: Every day | ORAL | Status: DC | PRN

## 2021-07-27 MED ORDER — ATORVASTATIN CALCIUM 40 MG PO TABS
40.0000 mg | ORAL_TABLET | Freq: Every evening | ORAL | Status: DC
Start: 2021-07-27 — End: 2021-07-29
  Administered 2021-07-27 – 2021-07-28 (×2): 40 mg via ORAL
  Filled 2021-07-27 (×2): qty 1

## 2021-07-27 MED ORDER — ACETAMINOPHEN 325 MG PO TABS
650.0000 mg | ORAL_TABLET | Freq: Four times a day (QID) | ORAL | Status: DC | PRN
  Administered 2021-07-28 – 2021-07-29 (×2): 650 mg via ORAL
  Filled 2021-07-27 (×2): qty 2

## 2021-07-27 MED ORDER — VANCOMYCIN HCL 1250 MG/250ML IV SOLN
1250.0000 mg | Freq: Once | INTRAVENOUS | Status: AC
  Administered 2021-07-27: 1250 mg via INTRAVENOUS
  Filled 2021-07-27: qty 250

## 2021-07-27 MED ORDER — FLUORESCEIN SODIUM 1 MG OP STRP
1.0000 | ORAL_STRIP | Freq: Once | OPHTHALMIC | Status: AC
  Administered 2021-07-27: 1 via OPHTHALMIC
  Filled 2021-07-27: qty 1

## 2021-07-27 NOTE — Assessment & Plan Note (Addendum)
-   This patient's presenting complaint and he is currently afebrile.  Has been seen in the last few weeks for this by his PCP and went to urgent care yesterday.  Received 3 days of erythromycin and yesterday received a dose of ceftriaxone IM and given steroids. -Given his worsening he presented to the [facility] ED and was transferred to [name] by ophthalmology request -Consulted Dr. Posey Pronto in Ophthalmology for  evaluation and recs of eye drops -His diseases has also been consulted and they are recommending acyclovir and we have also initiated IV vancomycin per their request -CT of the orbits done as above -Currently not septic on admission he is afebrile and has no white count -Further care per specialists and ophthalmology just recommends erythromycin topically to prevent scarring and recommends continue IV acyclovir; -Gentle IV fluid hydration with 50 MLS per hour given his history of combined systolic and diastolic CHF -Initiated on prednisolone 60 mg p.o. dail and will change to prednisone taper for discharge -Continued pain control was given IV morphine 4 mg x 2 and also fentanyl 50 mcg IV; will change to fentanyl 12.5 to 25 mg every 2 as needed -Given that patient has a pacer they are checking to see if an MRI is compatible or not but likely does not need an MRI given that he is improving -EDP exam done and he had dendrites on fluorescein stain with eyelid vesicles and patient admits to having some gross vision loss when compared to the right. 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

## 2021-07-27 NOTE — Assessment & Plan Note (Addendum)
-  Has a Documented Stage 4 CKD per wife and sees Dr. Gean Quint in the outpatient -Patient is BUN/creatinine is now 35/1.80 -> 40/1.89 and is improved to 36/1.70 -Avoid nephrotoxic medications, contrast dyes, hypotension and dehydration and renally dose medications -Patient is going to be placed on IV vancomycin and IV acyclovir so we will start gentle IV fluid hydration with 50 mL/hr given that he has a history of chronic combined CHF -Patient was given additional 500 mL yesterday over 4 hours -Continue monitor and trend and repeat CMP within 1 week

## 2021-07-27 NOTE — Progress Notes (Signed)
Pharmacy Antibiotic Note  Ryan [name] is a 88 y.o. male admitted on 07/27/2021 with  Zoster ophthalmicus  .  Pharmacy has been consulted for IV acyclovir dosing. Now adding vancomycin for periorbital L eye cellulitis.  SCr 1.8, noted baseline ~2   Plan: Vancomycin 1250mg  IV now then 1000mg  IV q48h. Est AUC 480 with SCr 1.8 Acyclovir 10mg /kg (600 mg) IV q24h (NS at 50 ml/hr) Trend WBC, Fever, Renal function, & Clinical course Vanc levels prn      Temp (24hrs), Avg:97.8 F (36.6 C), Min:97.6 F (36.4 C), Max:97.9 F (36.6 C)  Recent Labs  Lab 07/27/21 0950  WBC 5.9  CREATININE 1.80*     Estimated Creatinine Clearance: 23.6 mL/min (A) (by C-G formula based on SCr of 1.8 mg/dL (H)).    Allergies  Allergen Reactions   Ciprofloxacin Rash   Hydrochlorothiazide Rash   Sulfa Antibiotics Anaphylaxis   Ace Inhibitors Cough   Losartan Cough   Carvedilol Cough    Pt states it "causes him too cough."   Lisinopril Cough    Pt reports worsening cough and "feeling wheezy."   Soy Allergy Nausea And Vomiting   Antimicrobials this admission: Acyclovir 2/25 >>  Vancomycin 2/25>>  Microbiology results: 2/25 BCx:   Thank you for allowing pharmacy to be a part of this patients care.  Sherlon Handing, PharmD, BCPS Please see amion for complete clinical pharmacist phone list 07/27/2021 5:15 PM

## 2021-07-27 NOTE — H&P (Signed)
History and Physical    Patient: Ryan [name] NLZ:[id number] DOB: 25-Jun-1929 DOA: 07/27/2021 DOS: the patient was seen and examined on 07/27/2021 PCP: Dorothyann Peng, NP  Patient coming from: Home  Chief Complaint:  Chief Complaint  Patient presents with   Eye Pain   HPI: The patient is an 88 year old elderly Caucasian male with a past medical history significant for but not limited to PAF on anticoagulation, history of tachybradycardia syndrome, history of AVR, history of CAD, history of chronic combined systolic and diastolic CHF, hypertension, hyperlipidemia, asthma, hypothyroidism, chronic kidney disease stage IIIb, history of endocarditis as well as other comorbidities who presented to the emergency room with worsening left eye pain and drainage for last 2 weeks.  Symptoms started about 2 weeks ago when he started having some dry eyes and went to his ophthalmologist a few days ago for redness and irritation.  At that time he was given doxycycline 100 mg p.o. twice daily and azithromycin eyedrops.  Given his continued worsening he saw his PCP received antibiotics and steroids for presumed facial soft tissue infection.  He progressively had worsening eye pain and drainage for last 2 weeks and several days ago he started having pustules on his forehead and scalp when he woke up this a.m.  Yesterday at his PCPs office he was given ceftriaxone IM and Depo-Medrol today continues to have symptoms and presented.  His CBC was normal CMP was stable with a GFR and his creatinine of 1.8 given his history of CKD stage IIIb.  A CT of the orbits was done and showed  "Left preseptal soft tissue swelling. No acute intraorbital finding on either side."  The EDP spoke with the hospitalist team and they recommended transfer to [name] given the ophthalmology request.  He was given pain management, prednisolone and started on acyclovir.  ID was also consulted who recommended acyclovir and vancomycin.  I have  notified Dr. Posey Pronto of ophthalmology that the patient has arrived to [facility].  Review of Systems: As mentioned in the history of present illness. All other systems reviewed and are negative. 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  iron deficient, ferritin 17 04/2018   Arthritis    "hands, lower back" (04/30/2016)   Asthma    Bacterial endocarditis    Carotid artery obstruction    a. s/p bilat CEA.   Chronic combined systolic (congestive) and diastolic (congestive) heart failure (HCC) 03/18/2016   Chronic lower back pain    CKD (chronic kidney disease) stage 4, GFR 15-29 ml/min (HCC) 12/2017   Claudication in peripheral vascular disease ([place]) 01/01/2018   Claudication   Coronary arteriosclerosis in native artery    a. 2017 Cardiac catheterization demonstrated worsening CAD with 70% mid LCx, 80% OM1 and 80% mid RCA stenosis. b.  In 11/17, he underwent successful rotational atherectomy and DES to RCA.   Decreased diffusion capacity 10/23/2017   Diverticulitis of colon    DJD (degenerative joint disease)    Elevated TSH 06/30/2017   GERD (gastroesophageal reflux disease)    History of hiatal hernia    HLD (hyperlipidemia)    Hypertension    Hypertensive heart disease with heart failure (HCC)    Intractable back pain 11/07/2017   OSA (obstructive sleep apnea)    intolerant to CPAP   PAF (paroxysmal atrial fibrillation) (HCC)    Paroxysmal  supraventricular tachycardia (HCC)    Presence of permanent cardiac pacemaker    Primary malignant neoplasm of bladder [facility])    Prostate cancer ([place])    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)    Thrombocytopenia ([place]) 11/07/2017   Urinary retention 11/07/2017   Past Surgical History:  Procedure Laterality Date    APPENDECTOMY     CARDIAC CATHETERIZATION N/A 01/17/2016   Procedure: Right/Left Heart Cath and Coronary Angiography;  Surgeon: Belva Crome, MD;  Location: [place] CV [facility];  Service: Cardiovascular;  Laterality: N/A;   CARDIAC CATHETERIZATION N/A 04/30/2016   Procedure: Coronary/Graft Atherectomy;  Surgeon: Sherren Mocha, MD;  Location: [place] [facility];  Service: Cardiovascular;  Laterality: N/A;   CAROTID ENDARTERECTOMY Bilateral    CATARACT EXTRACTION W/ INTRAOCULAR LENS  IMPLANT, BILATERAL Bilateral    COLON SURGERY  1981   Meckles Diverticulum with volvulus   COLONOSCOPY WITH PROPOFOL N/A 05/01/2018   Procedure: COLONOSCOPY WITH PROPOFOL;  Surgeon: Rush Landmark Telford Nab., MD;  Location: [place];  Service: Gastroenterology;  Laterality: N/A;   CORONARY ANGIOGRAPHY N/A 01/18/2018   Procedure: CORONARY ANGIOGRAPHY;  Surgeon: Lorretta Harp, MD;  Location: [place] [facility];  Service: Cardiovascular;  Laterality: N/A;   ENTEROSCOPY N/A 04/30/2018   Procedure: ENTEROSCOPY;  Surgeon: Rush Landmark Telford Nab., MD;  Location: [place];  Service: Gastroenterology;  Laterality: N/A;   EP IMPLANTABLE DEVICE N/A 03/21/2016   Procedure: Pacemaker Implant;  Surgeon: Evans Lance, MD;  Location: [place] [facility];  Service: Cardiovascular;  Laterality: N/A;   EYE SURGERY     INGUINAL HERNIA REPAIR Right 2009   INSERT / REPLACE / REMOVE PACEMAKER     INSERTION PROSTATE RADIATION SEED     POLYPECTOMY  05/01/2018   Procedure: POLYPECTOMY;  Surgeon: Rush Landmark Telford Nab., MD;  Location: [place];  Service: Gastroenterology;;   TEE WITHOUT CARDIOVERSION N/A 06/10/2016   Procedure: TRANSESOPHAGEAL ECHOCARDIOGRAM (TEE);  Surgeon: Sherren Mocha, MD;  Location: [place];  Service: Open Heart Surgery;  Laterality: N/A;   TEE WITHOUT CARDIOVERSION N/A 11/10/2017   Procedure: TRANSESOPHAGEAL ECHOCARDIOGRAM (TEE);  Surgeon: Acie Fredrickson Wonda Cheng, MD;  Location: [facility] ENDOSCOPY;  Service:  Cardiovascular;  Laterality: N/A;   TEE WITHOUT CARDIOVERSION N/A 12/15/2017   Procedure: TRANSESOPHAGEAL ECHOCARDIOGRAM (TEE);  Surgeon: Buford Dresser, MD;  Location: [facility]COPY;  Service: Cardiovascular;  Laterality: N/A;   TEE WITHOUT CARDIOVERSION N/A 03/30/2018   Procedure: TRANSESOPHAGEAL ECHOCARDIOGRAM (TEE);  Surgeon: Jerline Pain, MD;  Location: [facility] ENDOSCOPY;  Service: Cardiovascular;  Laterality: N/A;   TONSILLECTOMY  ~ 1947   TRANSCATHETER AORTIC VALVE REPLACEMENT, TRANSFEMORAL N/A 06/10/2016   Procedure: TRANSCATHETER AORTIC VALVE REPLACEMENT, TRANSFEMORAL;  Surgeon: Sherren Mocha, MD;  Location: [place];  Service: Open Heart Surgery;  Laterality: N/A;   Social History:  reports that he has never smoked. He has never used smokeless tobacco. He reports that he does not drink alcohol and does not use drugs.  Allergies  Allergen Reactions   Ciprofloxacin Rash   Hydrochlorothiazide Rash   Sulfa Antibiotics Anaphylaxis   Ace Inhibitors Cough   Losartan Cough   Carvedilol Cough    Pt states it "causes him too cough."   Lisinopril Cough    Pt reports worsening cough and "feeling wheezy."   Soy Allergy Nausea And Vomiting   Family History  Problem Relation Age of Onset   Heart disease Mother  Heart disease Father    Prior to Admission medications   Medication Sig Start Date End Date Taking? Authorizing Provider  ELIQUIS 2.5 MG TABS tablet TAKE 1 TABLET(2.5 MG) BY MOUTH TWICE DAILY 03/15/21  Yes Croitoru, Mihai, MD  acetaminophen (TYLENOL) 500 MG tablet Take 500 mg by mouth daily.    [provider]  acetaminophen-codeine (TYLENOL #3) 300-30 MG tablet Take 1 tablet by mouth every 8 (eight) hours as needed for moderate pain. 03/15/20   Nafziger, Tommi Rumps, NP  albuterol (VENTOLIN HFA) 108 (90 Base) MCG/ACT inhaler Inhale 2 puffs into the lungs every 6 (six) hours as needed for wheezing or shortness of breath. 06/18/21   Nafziger, Tommi Rumps, NP  amLODipine (NORVASC) 5 MG  tablet TAKE 1 TABLET(5 MG) BY MOUTH DAILY 05/08/21   Croitoru, Mihai, MD  amoxicillin (AMOXIL) 500 MG tablet TAKE 1 TABLET(500 MG) BY MOUTH TWICE DAILY 03/26/21   Comer, Okey Regal, MD  atorvastatin (LIPITOR) 40 MG tablet TAKE 1 TABLET(40 MG) BY MOUTH DAILY AT 6 PM 10/31/20   Croitoru, Mihai, MD  cholecalciferol (VITAMIN D) 1000 units tablet Take 1,000 Units by mouth 2 (two) times a week. Monday and Thursday    [provider]  furosemide (LASIX) 20 MG tablet Take 1 tablet (20 mg total) by mouth daily as needed for fluid or edema. 09/10/20   Croitoru, Mihai, MD  isosorbide mononitrate (IMDUR) 30 MG 24 hr tablet Take 1 tablet (30 mg total) by mouth daily. 09/10/20   Croitoru, Mihai, MD  latanoprost (XALATAN) 0.005 % ophthalmic solution Place 1 drop into both eyes at bedtime.    [provider]  metoprolol succinate (TOPROL-XL) 100 MG 24 hr tablet Take 1 tablet (100 mg total) by mouth daily. Take with or immediately following a meal. 09/10/20   Croitoru, Dani Gobble, MD  nitroGLYCERIN (NITROSTAT) 0.4 MG SL tablet Place 1 tablet (0.4 mg total) under the tongue every 5 (five) minutes x 3 doses as needed for chest pain. 07/20/19   Dorothy Spark, MD  predniSONE (DELTASONE) 20 MG tablet Take 1 tablet (20 mg total) by mouth daily with breakfast. 07/26/21   Nafziger, Tommi Rumps, NP  vitamin B-12 (CYANOCOBALAMIN) 1000 MCG tablet Take 1,000 mcg by mouth daily.    [provider]    Physical Exam: Vitals:   07/27/21 0856 07/27/21 0900 07/27/21 1001 07/27/21 1330  BP: (!) 182/87 (!) 180/68 (!) 168/76 (!) 155/67  Pulse: 61 60 61 60  Resp: 20  20 14   Temp: 97.9 F (36.6 C)     SpO2: 99% 98% 96% 96%   Constitutional: WN/WD elderly Caucasian male in no acute distress appears a little fatigued Eyes: He has some left eye swelling and some vesicles noted with vesicles in the V1 distribution Respiratory: Diminished to auscultation bilaterally with coarse breath sounds, no wheezing, rales, rhonchi or  crackles. Normal respiratory effort and patient is not tachypenic. No accessory muscle use.  Unlabored breathing Cardiovascular: RRR, no murmurs / rubs / gallops. S1 and S2 auscultated.  No appreciable extremity edema Abdomen: Soft, non-tender, non-distended.  Bowel sounds positive.  GU: Deferred. Skin: Has erythema and vesicles in the right V1 distribution and has some left eye swelling of his leg with erythema.. No induration; Warm and dry.   Data Reviewed:  I have independently reviewed the patient's clinical data  Patient is afebrile and has no leukocytosis and his BUN/creatinine is 35/1.80.  His blood sugar slightly elevated at 116  Assessment and Plan: * Herpes zoster ophthalmicus- (  present on admission) - This patient's presenting complaint and he is currently afebrile.  Has been seen in the last few weeks for this by his PCP and went to urgent care yesterday.  Received 3 days of erythromycin and yesterday received a dose of ceftriaxone IM and given steroids. -Given his worsening he presented to the [place] ED and was transferred to [name] by ophthalmology request -Consulted Dr. Posey Pronto in Ophthalmology for  evaluation and recs of eye drops -His diseases has also been consulted and they are recommending acyclovir and we have also initiated IV vancomycin per their request -CT of the orbits done as above -Currently not septic on admission he is afebrile and has no white count -Further care per specialists -Gentle IV fluid hydration with 50 MLS per hour given his history of combined systolic and diastolic CHF -Initiated on prednisolone 60 mg p.o. daily -Continue pain control was given IV morphine 4 mg x 2 and also fentanyl 50 mcg IV -Given that patient has a pacer they are checking to see if an MRI is compatible or not -EDP exam done and he had dendrites on fluorescein stain with eyelid vesicles and patient admits to having some gross vision loss when compared to the  right.    Stage 3b chronic kidney disease (CKD) ([place]) - Patient is BUN/creatinine is now 35/1.80 -Avoid nephrotoxic medications, contrast dyes, hypotension and renally dose medications -Patient is going to be placed on IV vancomycin and IV acyclovir so we will start gentle IV fluid hydration with 50 cc/h given that he has a history of chronic combined CHF -Continue monitor and trend and repeat CMP in a.m.  Periorbital cellulitis of left eye - See above.  His herpes zoster ophthalmicus resulting in vision impairment with secondary periorbital cellulitis -Ophthalmology has been consulted and he has been initiated on acyclovir and was given 10 mg/kg/day of methylprednisolone and vancomycin has been ordered.  ID has also been consulted for further evaluation recommendations -Check blood cultures x2 and further infectious work-up  HLD (hyperlipidemia)- (present on admission) - Resume home atorvastatin 40 mg p.o. nightly  Presence of permanent cardiac pacemaker- (present on admission) - Pacer in place and he has a history of a TAVR  Hypertension- (present on admission) - Continue to monitor blood pressures per protocol continue home medications including amlodipine 5 mg p.o. daily, isosorbide mononitrate 30 mg p.o. daily, metoprolol succinate 100 g p.o. daily   Chronic combined systolic (congestive) and diastolic (congestive) heart failure (HCC)- (present on admission) -Continue monitor volume status carefully -Currently not decompensated -Resume home cardiac medications as above and continue Lasix 20 g p.o. daily as needed for fluid or edema -Strict I's and O's and Daily Weights -Continue monitor for signs and symptoms of volume overload   PAF (paroxysmal atrial fibrillation) ([place])- (present on admission) - Continue to monitor on telemetry -Continue with anticoagulation with apixaban 2.5 mg p.o. twice daily given his age and renal function -continue with home beta-blocker with  metoprolol succinate 100 mg p.o. daily  Coronary artery disease of native artery of native heart with stable angina pectoris ([place])- (present on admission) - Has also history of TAVR -Denies any chest pain -Continue home medications including isosorbide mononitrate 30 mg p.o. daily, metoprolol succinate 100 mg p.o. daily, nitroglycerin 0.4 mg sublingually every 5 minutes as needed and also resume his home atorvastatin 40 g p.o. daily   Advance Care Planning:   Code Status: Full Code   Consults: ID, Opthalmology   Family Communication:  Discussed with Wife at bedside   Severity of Illness: The appropriate patient status for this patient is INPATIENT. Inpatient status is judged to be reasonable and necessary in order to provide the required intensity of service to ensure the patient's safety. The patient's presenting symptoms, physical exam findings, and initial radiographic and laboratory data in the context of their chronic comorbidities is felt to place them at high risk for further clinical deterioration. Furthermore, it is not anticipated that the patient will be medically stable for discharge from the hospital within 2 midnights of admission.   * I certify that at the point of admission it is my clinical judgment that the patient will require inpatient hospital care spanning beyond 2 midnights from the point of admission due to high intensity of service, high risk for further deterioration and high frequency of surveillance required.*  Author: Kerney Elbe, DO [facility]  07/27/2021 4:51 PM  For on call review [email or web address].

## 2021-07-27 NOTE — ED Notes (Signed)
Pt requested family to sign consent for transport form. Pt understood what was being signed on his behalf.

## 2021-07-27 NOTE — Progress Notes (Signed)
Plan of Care Note for accepted transfer   Patient: Ryan [name] MRN: [id number]   [place]: 07/27/2021  Facility requesting transfer: [facility]. Requesting Provider: Campbell Stall, DO. Reason for transfer: Herpes Zoster Ophthalmicus. Facility course:  88 year old male with a past medical history of paroxysmal atrial fibrillation, tachybradycardia syndrome, history of pacemaker placement, history of endocarditis, status post AVR, CAD, chronic combined systolic and diastolic heart failure hypertension, hyperlipidemia asthma, hypothyroidism, stage IIIb CKD who presented to the emergency department with complaints of progressively worse left eye pain and drainage for the past 2 weeks.  He started several days ago having pustules in his forehead and scalp.  He was initially seen by his primary care doctor and given erythromycin for his eye, then he was given doxycycline 3 days ago, yesterday he recently a gram of ceftriaxone IM and Depo-Medrol.   However, stated he continues to have symptoms.  CBC was normal.  CMP with stable GFR.  CT orbits has been ordered .  Dr. Campbell Stall spoke to ophthalmology who recommended transfer to [facility], prednisolone and pain management.  The case was also discussed with infectious diseases who recommended acyclovir and vancomycin.  Please notify ophthalmology of the patient's arrival to [place] of care: The patient is accepted for admission to Telemetry unit, at [facility].  Author: Reubin Milan, MD 07/27/2021  Check [email or web address] for on-call coverage.  Nursing staff, Please call [place] number on Amion as soon as patient's arrival, so appropriate admitting provider can evaluate the pt.

## 2021-07-27 NOTE — Assessment & Plan Note (Addendum)
-  Has also history of TAVR -Denies any chest pain -Continue home medications including Isosorbide Mononitrate 30 mg p.o. daily, Metoprolol Succinate 100 mg p.o. daily, Nitroglycerin 0.4 mg sublingually every 5 minutes as needed and also resume his home Atorvastatin 40 g p.o. daily -Follow-up with cardiology in outpatient setting

## 2021-07-27 NOTE — Assessment & Plan Note (Addendum)
-   Pacer in place and he has a history of a TAVR with TAVR Endocarditis x2 -Continue with suppressive amoxicillin 500 every 12 at discharge as the 500 3 times daily has not been stopped by ID

## 2021-07-27 NOTE — Assessment & Plan Note (Addendum)
-  Resume home Atorvastatin 40 mg p.o. nightly

## 2021-07-27 NOTE — Assessment & Plan Note (Addendum)
-  Continue monitor volume status carefully -Currently not decompensated -Resume home cardiac medications as above and continue Lasix 20 g p.o. daily as needed for fluid or edema but he is no longer taking this -Strict I's and O's and Daily Weights; Patient is + 2.512 liters since admission  -Continue monitor for signs and symptoms of volume overload and follow-up with cardiology outpatient setting

## 2021-07-27 NOTE — Consult Note (Signed)
[facility] Infectious Disease  Total days of antibiotics 1-acyclovir  Reason for Consult: left V1 zoster ophthalmicus in immunocompetent host  Referring Physician: sheikh  Principal Problem:   Herpes zoster ophthalmicus Active Problems:   Coronary artery disease of native artery of native heart with stable angina pectoris (HCC)   PAF (paroxysmal atrial fibrillation) (HCC)   Chronic combined systolic (congestive) and diastolic (congestive) heart failure ([place])   Hypertension   Presence of permanent cardiac pacemaker   HLD (hyperlipidemia)   Periorbital cellulitis of left eye   Stage 3b chronic kidney disease (CKD) (HCC)    HPI: Ryan [name] is a 88 y.o. male with histroy of CAD, hx of TAVR c/b endocarditis, PPM, pAF, CKD3, admitted on 2/25 for acute onset of left sided facial and eye pain with associated rash < 24hrs (of rash onset). Has not had zoster vaccine. Patient was seen earlier in the week by his ophtho office for redness and edema bilateral eyelids. ? Concern for blepheritis, vs. Possible allergies. AT that time, started on doxycycline 100mg  bid, erythromycin eye drops, and netty pot. He was seen by his pcp yesterday where he still had swelling to left orbit and left forehead but not necessarily vesicular rash. Due to possible orbital cellulitis, he was started steroid pack and continued on doxycycline ( in the office give steroid injection plus ctx 1gm IM). The following morning, has increasing pain to left side of scalp and eye pain with increasing blurry vision to left eye. He was seen at [facility] -where his clinical presentation is more c/w with zoster ophthalmicus of V1 distribution. He was started on iv acyclovir, continued on steroids. Head CT showed preseptal edema. He is Admitted for symptoms, pain management and initiation of Iv antivirals and urgent ophtho evaluation.  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  iron deficient, ferritin 17 04/2018   Arthritis    "hands, lower back" (04/30/2016)   Asthma    Bacterial endocarditis    Carotid artery obstruction    a. s/p bilat CEA.   Chronic combined systolic (congestive) and diastolic (congestive) heart failure (HCC) 03/18/2016   Chronic lower back pain    CKD (chronic kidney disease) stage 4, GFR 15-29 ml/min (HCC) 12/2017   Claudication in peripheral vascular disease ([place]) 01/01/2018   Claudication   Coronary arteriosclerosis in native artery    a. 2017 Cardiac catheterization demonstrated worsening CAD with 70% mid LCx, 80% OM1 and 80% mid RCA stenosis. b.  In 11/17, he underwent successful rotational atherectomy and DES to RCA.   Decreased diffusion capacity 10/23/2017   Diverticulitis of colon    DJD (degenerative joint disease)    Elevated TSH 06/30/2017   GERD (gastroesophageal reflux disease)    History of hiatal hernia    HLD (hyperlipidemia)    Hypertension    Hypertensive heart disease with heart failure (HCC)    Intractable back pain 11/07/2017   OSA (obstructive sleep apnea)    intolerant to CPAP   PAF (paroxysmal atrial fibrillation) (HCC)    Paroxysmal supraventricular tachycardia (HCC)    Presence of permanent cardiac pacemaker    Primary malignant neoplasm of bladder [facility])    Prostate cancer ([place])    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   enterococcal bacteremia   S/P AVR 12/02/2017   Syncope 10/23/2017   Tachy-brady  syndrome ([place])    Thrombocytopenia ([place]) 11/07/2017   Urinary retention 11/07/2017    Allergies:  Allergies  Allergen Reactions   Ciprofloxacin Rash   Hydrochlorothiazide Rash   Sulfa Antibiotics Anaphylaxis   Ace Inhibitors Cough   Losartan Cough   Carvedilol Cough    Pt states it "causes him too cough."   Lisinopril Cough    Pt reports worsening cough and "feeling wheezy."   Soy Allergy Nausea And Vomiting     MEDICATIONS:  acyclovir       prednisoLONE  1 mg/kg/day Oral Daily    Social History   Tobacco Use   Smoking status: Never   Smokeless tobacco: Never  Vaping Use   Vaping Use: Never used  Substance Use Topics   Alcohol use: No    Alcohol/week: 0.0 standard drinks    Comment: 04/30/2016 "nothing in years"   Drug use: No    Family History  Problem Relation Age of Onset   Heart disease Mother    Heart disease Father     Review of Systems -   Constitutional: Negative for fever, chills, diaphoresis, activity change, appetite change, fatigue and unexpected weight change.  HENT: Negative for congestion, sore throat, rhinorrhea, sneezing, trouble swallowing and sinus pressure.  Eyes: blurry vision of left eye. Negative for photophobia and visual disturbance.  Respiratory: Negative for cough, chest tightness, shortness of breath, wheezing and stridor.  Cardiovascular: Negative for chest pain, palpitations and leg swelling.  Gastrointestinal: Negative for nausea, vomiting, abdominal pain, diarrhea, constipation, blood in stool, abdominal distention and anal bleeding.  Genitourinary: Negative for dysuria, hematuria, flank pain and difficulty urinating.  Musculoskeletal: Negative for myalgias, back pain, joint swelling, arthralgias and gait problem.  Skin: Negative for color change, pallor, rash and wound.  Neurological: +pain associated with rash. Negative for dizziness, tremors, weakness and light-headedness.  Hematological: Negative for adenopathy. Does not bruise/bleed easily.  Psychiatric/Behavioral: Negative for behavioral problems, confusion, sleep disturbance, dysphoric mood, decreased concentration and agitation.     OBJECTIVE: Temp:  [97.9 F (36.6 C)] 97.9 F (36.6 C) (02/25 0856) Pulse Rate:  [60-61] 60 (02/25 1330) Resp:  [14-20] 14 (02/25 1330) BP: (155-182)/(67-87) 155/67 (02/25 1330) SpO2:  [96 %-99 %] 96 % (02/25 1330) Physical Exam  Constitutional: He is  oriented to person, place. He appears his stated age and slender physique. No distress.  HENT: left upper eyelid edematous with vesicular rash extended to his forehead and vertex of scalp Mouth/Throat: Oropharynx is clear and moist. No oropharyngeal exudate. ?3 small vesicles to rough of mouth, midline Cardiovascular: Normal rate,soft systolic murmur. Exam reveals no gallop and no friction rub.  No murmur heard.  Pulmonary/Chest: Effort normal and breath sounds normal. No respiratory distress. He has no wheezes.  Abdominal: Soft. Bowel sounds are normal. He exhibits no distension. There is no tenderness.  Lymphadenopathy:  He has no cervical adenopathy.  Neurological: He is alert and oriented to person, place, only. Skin: Skin is warm and dry. No rash noted. No erythema.  Psychiatric: He has a normal mood and affect. His behavior is normal.    LABS: Results for orders placed or performed during the hospital encounter of 07/27/21 (from the past 48 hour(s))  CBC with Differential     Status: None   Collection Time: 07/27/21  9:50 AM  Result Value Ref Range   WBC 5.9 4.0 - 10.5 K/uL   RBC 5.04 4.22 - 5.81 MIL/uL   Hemoglobin 15.6 13.0 - 17.0 g/dL  HCT 48.1 39.0 - 52.0 %   MCV 95.4 80.0 - 100.0 fL   MCH 31.0 26.0 - 34.0 pg   MCHC 32.4 30.0 - 36.0 g/dL   RDW 13.0 11.5 - 15.5 %   Platelets 157 150 - 400 K/uL   nRBC 0.0 0.0 - 0.2 %   Neutrophils Relative % 71 %   Neutro Abs 4.2 1.7 - 7.7 K/uL   Lymphocytes Relative 14 %   Lymphs Abs 0.8 0.7 - 4.0 K/uL   Monocytes Relative 13 %   Monocytes Absolute 0.8 0.1 - 1.0 K/uL   Eosinophils Relative 0 %   Eosinophils Absolute 0.0 0.0 - 0.5 K/uL   Basophils Relative 1 %   Basophils Absolute 0.0 0.0 - 0.1 K/uL   Immature Granulocytes 1 %   Abs Immature Granulocytes 0.07 0.00 - 0.07 K/uL    Comment: Performed at [facility], [place], [place] [address]  Comprehensive metabolic panel     Status: Abnormal    Collection Time: 07/27/21  9:50 AM  Result Value Ref Range   Sodium 138 135 - 145 mmol/L   Potassium 4.3 3.5 - 5.1 mmol/L   Chloride 102 98 - 111 mmol/L   CO2 27 22 - 32 mmol/L   Glucose, Bld 116 (H) 70 - 99 mg/dL    Comment: Glucose reference range applies only to samples taken after fasting for at least 8 hours.   BUN 35 (H) 8 - 23 mg/dL   Creatinine, Ser 1.80 (H) 0.61 - 1.24 mg/dL   Calcium 9.8 8.9 - 10.3 mg/dL   Total Protein 6.3 (L) 6.5 - 8.1 g/dL   Albumin 4.2 3.5 - 5.0 g/dL   AST 24 15 - 41 U/L   ALT 14 0 - 44 U/L   Alkaline Phosphatase 73 38 - 126 U/L   Total Bilirubin 0.5 0.3 - 1.2 mg/dL   GFR, Estimated 36 (L) >60 mL/min    Comment: (NOTE) Calculated using the CKD-EPI Creatinine Equation (2021)    Anion gap 9 5 - 15    Comment: Performed at [facility], [address], [place], [place] [address]  Resp Panel by RT-PCR (Flu A&B, Covid) Nasopharyngeal Swab     Status: None   Collection Time: 07/27/21 10:33 AM   Specimen: Nasopharyngeal Swab; Nasopharyngeal(NP) swabs in vial transport medium  Result Value Ref Range   SARS Coronavirus 2 by RT PCR NEGATIVE NEGATIVE    Comment: (NOTE) SARS-CoV-2 target nucleic acids are NOT DETECTED.  The SARS-CoV-2 RNA is generally detectable in upper respiratory specimens during the acute phase of infection. The lowest concentration of SARS-CoV-2 viral copies this assay can detect is 138 copies/mL. A negative result does not preclude SARS-Cov-2 infection and should not be used as the sole basis for treatment or other patient management decisions. A negative result may occur with  improper specimen collection/handling, submission of specimen other than nasopharyngeal swab, presence of viral mutation(s) within the areas targeted by this assay, and inadequate number of viral copies(<138 copies/mL). A negative result must be combined with clinical observations, patient history, and epidemiological information. The  expected result is Negative.  Fact Sheet for Patients:  EntrepreneurPulse.com.au  Fact Sheet for Healthcare Providers:  IncredibleEmployment.be  This test is no t yet approved or cleared by the [place] FDA and  has been authorized for detection and/or diagnosis of SARS-CoV-2 by FDA under an Emergency Use Authorization (EUA). This EUA will remain  in effect (meaning this  test can be used) for the duration of the COVID-19 declaration under Section 564(b)(1) of the Act, 21 U.S.C.section 360bbb-3(b)(1), unless the authorization is terminated  or revoked sooner.       Influenza A by PCR NEGATIVE NEGATIVE   Influenza B by PCR NEGATIVE NEGATIVE    Comment: (NOTE) The Xpert Xpress SARS-CoV-2/FLU/RSV plus assay is intended as an aid in the diagnosis of influenza from Nasopharyngeal swab specimens and should not be used as a sole basis for treatment. Nasal washings and aspirates are unacceptable for Xpert Xpress SARS-CoV-2/FLU/RSV testing.  Fact Sheet for Patients: EntrepreneurPulse.com.au  Fact Sheet for Healthcare Providers: IncredibleEmployment.be  This test is not yet approved or cleared by the [place] FDA and has been authorized for detection and/or diagnosis of SARS-CoV-2 by FDA under an Emergency Use Authorization (EUA). This EUA will remain in effect (meaning this test can be used) for the duration of the COVID-19 declaration under Section 564(b)(1) of the Act, 21 U.S.C. section 360bbb-3(b)(1), unless the authorization is terminated or revoked.  Performed at [facility], [address] [place], [place] [address]     MICRO: review IMAGING: CT Orbits W Contrast  Result Date: 07/27/2021 CLINICAL DATA:  Monocular vision loss. Herpes zoster with red swollen left eye EXAM: CT ORBITS WITH CONTRAST TECHNIQUE: Multidetector CT images was performed according to the standard  protocol following intravenous contrast administration. RADIATION DOSE REDUCTION: This exam was performed according to the departmental dose-optimization program which includes automated exposure control, adjustment of the mA and/or kV according to patient size and/or use of iterative reconstruction technique. CONTRAST:  24mL OMNIPAQUE IOHEXOL 300 MG/ML  SOLN COMPARISON:  No pertinent prior exam. FINDINGS: Orbits: Preseptal soft tissue swelling on the left. Bilateral cataract resection and senile calcifications of the globes. No evidence of intra-ocular collection or scleral thickening. Unremarkable optic nerve sheath complexes, lacrimal glands, muscles, ophthalmic veins, and orbital fat. Visible paranasal sinuses: Clear Soft tissues: Left preseptal soft tissue swelling is noted above. Osseous: Negative for fracture or lesion. Limited intracranial: Negative IMPRESSION: Left preseptal soft tissue swelling. No acute intraorbital finding on either side. Electronically Signed   By: Jorje Guild M.D.   On: 07/27/2021 11:07    Assessment/Plan:  88yo M with hx of CAD, AS s/p TAVR c/b enterococcal endocarditis, and life long suppression with amoxicillin, now admitted for V1 HZO with acute pain to area and blurry vision. 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 Would like 2-3 days of IV to see some improvement of his symptoms - please have ophtho to evaluate to see if any further eye drops are needed for management, ? Secondary bacterial infection - continue on prednisolone- will like give short course -continue on contact precautions. Does not appear to be disseminated  Ckd 3 = recommend to give IVF in setting of acyclovir IV and watch kidney function  Hx of TAVR and enterococcal endocarditis = continue on amoxicillin 500mg  bid for chronic suppression

## 2021-07-27 NOTE — Progress Notes (Signed)
RT Note: RT placed 20G IV R A/C, no complications, RN aware.

## 2021-07-27 NOTE — ED Provider Notes (Signed)
[facility] EMERGENCY DEPT Provider Note   CSN: [id number] Arrival date & time: 07/27/21  0846     History  Chief Complaint  Patient presents with   Eye Pain    Ryan [name] is a 88 y.o. male.  Pt is a 88 yo male with pmh of afib with rvr, pacemaker, hypertensive heart failure, hyperlipidema, MI with coronary angiography, and prostetic heart valve with hx of endocarditis presenting for facial rash and eye pain. Pt admits to left sided facial swelling that started 3-4 days ago with fever. Pt states he went to urgent care yesterday and received antibiotics and steriods for persumed facial soft tissue infection. Admits to left   The history is provided by the patient and the spouse.  Eye Pain Pertinent negatives include no chest pain, no abdominal pain and no shortness of breath.      Home Medications Prior to Admission medications   Medication Sig Start Date End Date Taking? Authorizing Provider  ELIQUIS 2.5 MG TABS tablet TAKE 1 TABLET(2.5 MG) BY MOUTH TWICE DAILY 03/15/21  Yes Croitoru, Mihai, MD  acetaminophen (TYLENOL) 500 MG tablet Take 500 mg by mouth daily.    [provider]  acetaminophen-codeine (TYLENOL #3) 300-30 MG tablet Take 1 tablet by mouth every 8 (eight) hours as needed for moderate pain. 03/15/20   Nafziger, Tommi Rumps, NP  albuterol (VENTOLIN HFA) 108 (90 Base) MCG/ACT inhaler Inhale 2 puffs into the lungs every 6 (six) hours as needed for wheezing or shortness of breath. 06/18/21   Nafziger, Tommi Rumps, NP  amLODipine (NORVASC) 5 MG tablet TAKE 1 TABLET(5 MG) BY MOUTH DAILY 05/08/21   Croitoru, Mihai, MD  amoxicillin (AMOXIL) 500 MG tablet TAKE 1 TABLET(500 MG) BY MOUTH TWICE DAILY 03/26/21   Comer, Okey Regal, MD  atorvastatin (LIPITOR) 40 MG tablet TAKE 1 TABLET(40 MG) BY MOUTH DAILY AT 6 PM 10/31/20   Croitoru, Mihai, MD  cholecalciferol (VITAMIN D) 1000 units tablet Take 1,000 Units by mouth 2 (two) times a week. Monday and Thursday    [provider]  furosemide (LASIX) 20 MG tablet Take 1 tablet (20 mg total) by mouth daily as needed for fluid or edema. 09/10/20   Croitoru, Mihai, MD  isosorbide mononitrate (IMDUR) 30 MG 24 hr tablet Take 1 tablet (30 mg total) by mouth daily. 09/10/20   Croitoru, Mihai, MD  latanoprost (XALATAN) 0.005 % ophthalmic solution Place 1 drop into both eyes at bedtime.    [provider]  metoprolol succinate (TOPROL-XL) 100 MG 24 hr tablet Take 1 tablet (100 mg total) by mouth daily. Take with or immediately following a meal. 09/10/20   Croitoru, Dani Gobble, MD  nitroGLYCERIN (NITROSTAT) 0.4 MG SL tablet Place 1 tablet (0.4 mg total) under the tongue every 5 (five) minutes x 3 doses as needed for chest pain. 07/20/19   Dorothy Spark, MD  predniSONE (DELTASONE) 20 MG tablet Take 1 tablet (20 mg total) by mouth daily with breakfast. 07/26/21   Nafziger, Tommi Rumps, NP  vitamin B-12 (CYANOCOBALAMIN) 1000 MCG tablet Take 1,000 mcg by mouth daily.    [provider]      Allergies    Ciprofloxacin, Hydrochlorothiazide, Sulfa antibiotics, Ace inhibitors, Losartan, Carvedilol, Lisinopril, and Soy allergy    Review of Systems   Review of Systems  Constitutional:  Negative for chills and fever.  HENT:  Negative for ear pain and sore throat.   Eyes:  Positive for pain and visual disturbance.  Respiratory:  Negative for  cough and shortness of breath.   Cardiovascular:  Negative for chest pain and palpitations.  Gastrointestinal:  Negative for abdominal pain and vomiting.  Genitourinary:  Negative for dysuria and hematuria.  Musculoskeletal:  Negative for arthralgias and back pain.  Skin:  Negative for color change and rash.  Neurological:  Negative for seizures and syncope.  All other systems reviewed and are negative.  Physical Exam Updated Vital Signs BP (!) 182/87    Pulse 61    Temp 97.9 F (36.6 C)    Resp 20    SpO2 99%  Physical Exam Vitals and nursing note reviewed.   Constitutional:      General: He is not in acute distress.    Appearance: He is well-developed.  HENT:     Head: Normocephalic and atraumatic.  Eyes:     Conjunctiva/sclera:     Right eye: Right conjunctiva is not injected. No chemosis, exudate or hemorrhage.    Left eye: Left conjunctiva is injected. No chemosis.    Pupils: Pupils are equal, round, and reactive to light.     Comments: Left sided gross vision loss when compared to right  Left eyelid vesicles  Dendrites on fluorescein   Cardiovascular:     Rate and Rhythm: Normal rate and regular rhythm.     Heart sounds: No murmur heard. Pulmonary:     Effort: Pulmonary effort is normal. No respiratory distress.     Breath sounds: Normal breath sounds.  Abdominal:     Palpations: Abdomen is soft.     Tenderness: There is no abdominal tenderness.  Musculoskeletal:        General: No swelling.     Cervical back: Neck supple.  Skin:    General: Skin is warm and dry.     Capillary Refill: Capillary refill takes less than 2 seconds.  Neurological:     Mental Status: He is alert.  Psychiatric:        Mood and Affect: Mood normal.        ED Results / Procedures / Treatments   Labs (all labs ordered are listed, but only abnormal results are displayed) Labs Reviewed  CBC WITH DIFFERENTIAL/PLATELET  COMPREHENSIVE METABOLIC PANEL    EKG None  Radiology No results found.  Procedures .Critical Care Performed by: Lianne Cure, DO Authorized by: Lianne Cure, DO   Critical care provider statement:    Critical care time (minutes):  76   Critical care was necessary to treat or prevent imminent or life-threatening deterioration of the following conditions: Herpes Zoster Ophthalmicus resulting in vision loss with secondary periorbital cellulitis.   Critical care was time spent personally by me on the following activities:  Development of treatment plan with patient or surrogate, discussions with consultants, evaluation  of patient's response to treatment, examination of patient, ordering and review of laboratory studies, ordering and review of radiographic studies, ordering and performing treatments and interventions, pulse oximetry, re-evaluation of patient's condition and review of old charts   Care discussed with comment:  Ophtho, infectious disease, admitting provider    Medications Ordered in ED Medications  fluorescein ophthalmic strip 1 strip (has no administration in time range)  fentaNYL (SUBLIMAZE) injection 50 mcg (has no administration in time range)    ED Course/ Medical Decision Making/ A&P                           Medical Decision Making Amount and/or Complexity of Data  Reviewed Labs: ordered. Radiology: ordered.  Risk Prescription drug management. Decision regarding hospitalization.   10:26 AM 88 yo male with pmh of afib with rvr, pacemaker, hypertensive heart failure, hyperlipidema, MI with coronary angiography, and prostetic heart valve with hx of endocarditis presenting for facial rash and eye pain. Pt admits to left sided facial swelling that started 3-4 days ago with fever.  Physical exam demonstrates -Left sided gross vision loss when compared to right  -Left eyelid vesicles  -Periorbital cellulitis -Dendrites on fluorescein   Dx: Herpes Zoster Ophthalmicus resulting in vision impairment with secondary periorbital cellulitis. Imaging pending to rule out orbital cellulitis and/or retinal involvement. IV acyclovir, 1mg /kg/day methylprednisolone, and vancomycin ordered. ID consulted and agreeable to see patient. Ophthalmologist Dr. Posey Pronto consulted and agreeable to see patient. Requesting update when pt gets to [facility] Patient accepted by admitting physician Dr. Olevia Bowens.   Pt has pacemaker... will check to see if MRI compatible.         Final Clinical Impression(s) / ED Diagnoses Final diagnoses:  Herpes zoster ophthalmicus of left eye  Visual changes  Periorbital  cellulitis of left eye    Rx / DC Orders ED Discharge Orders     None         Lianne Cure, DO 46/96/29 1245

## 2021-07-27 NOTE — Consult Note (Signed)
Ryan [name]                                                                               07/27/2021                                               Ophthalmology Consultation                                         Consult requested by: Dr. Pearline Cables  Reason for consultation:  Herpes Zoster Ophthalmicus  HPI: Patient transferred from [facility] with worsening eye pain/swelling from herpes zoster infection involving the right eyelids and periorbital region.  Pertinent Medical History:   Active Ambulatory Problems    Diagnosis Date Noted   DOE (dyspnea on exertion) 01/17/2016   Coronary artery disease of native artery of native heart with stable angina pectoris (HCC)    PAF (paroxysmal atrial fibrillation) ([place]) 03/18/2016   Chronic combined systolic (congestive) and diastolic (congestive) heart failure ([place]) 03/18/2016   Atrial fibrillation with rapid ventricular response ([place]) 03/18/2016   Hypertensive heart disease with heart failure ([place])    Pure hypercholesterolemia    Tachy-brady syndrome (HCC)    CAD (coronary artery disease), native coronary artery 04/30/2016   Anticoagulation management encounter 08/19/2016   Elevated TSH 06/30/2017   Syncope 10/23/2017   Decreased diffusion capacity 10/23/2017   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   Intractable back pain 11/07/2017   Elevated troponin 11/07/2017   Hypertension    Urinary retention 11/07/2017   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   S/P AVR 12/02/2017   Claudication in peripheral vascular disease ([place]) 01/01/2018   Unstable angina ([place]) 01/15/2018   Hypertensive urgency    Presence of permanent cardiac pacemaker 03/29/2018   Bacteremia due to Enterococcus 03/29/2018   Medication monitoring encounter 04/22/2018   GIB (gastrointestinal bleeding) 04/28/2018   HLD (hyperlipidemia) 04/28/2018   Asthma 04/28/2018   Hypothyroidism 04/28/2018   Chronic diastolic CHF (congestive heart failure) ([place])  04/28/2018   Symptomatic anemia 04/28/2018   Acute renal failure superimposed on stage 4 chronic kidney disease ([place]) 04/28/2018   History of endocarditis    Resolved Ambulatory Problems    Diagnosis Date Noted   Severe aortic stenosis 10/12/2015   Fatigue 01/17/2016   Atrial fibrillation with RVR ([place]) 03/18/2016   Thrombocytopenia ([place]) 11/07/2017   Enterococcal bacteremia 11/09/2017   Subacute bacterial endocarditis    Acute renal insufficiency 12/02/2017   Chest pain with high risk for cardiac etiology    Bacteremia 03/04/2020   Past Medical History:  Diagnosis Date   Age-related macular degeneration    Allergic rhinitis    Anemia 12/2017   Arthritis    Bacterial endocarditis    Carotid artery obstruction    Chronic lower back pain    CKD (chronic kidney disease) stage 4, GFR 15-29 ml/min ([place]) 12/2017   Coronary arteriosclerosis in native artery    Diverticulitis of colon  DJD (degenerative joint disease)    GERD (gastroesophageal reflux disease)    History of hiatal hernia    OSA (obstructive sleep apnea)    Paroxysmal supraventricular tachycardia (HCC)    Primary malignant neoplasm of bladder (HCC)    Prostate cancer ([place])      Pertinent Ophthalmic History: Recent treatment for blepharitis and IV treatment with cephalosporin and steroid.  Current Eye Medications: Erythromycin  Systemic medications on admission:   Medications Prior to Admission  Medication Sig Dispense Refill   ELIQUIS 2.5 MG TABS tablet TAKE 1 TABLET(2.5 MG) BY MOUTH TWICE DAILY 180 tablet 1   acetaminophen (TYLENOL) 500 MG tablet Take 500 mg by mouth daily.     acetaminophen-codeine (TYLENOL #3) 300-30 MG tablet Take 1 tablet by mouth every 8 (eight) hours as needed for moderate pain. 10 tablet 0   albuterol (VENTOLIN HFA) 108 (90 Base) MCG/ACT inhaler Inhale 2 puffs into the lungs every 6 (six) hours as needed for wheezing or shortness of breath. 8 g 2   amLODipine (NORVASC) 5 MG tablet  TAKE 1 TABLET(5 MG) BY MOUTH DAILY 90 tablet 1   amoxicillin (AMOXIL) 500 MG tablet TAKE 1 TABLET(500 MG) BY MOUTH TWICE DAILY 60 tablet 11   atorvastatin (LIPITOR) 40 MG tablet TAKE 1 TABLET(40 MG) BY MOUTH DAILY AT 6 PM 90 tablet 3   cholecalciferol (VITAMIN D) 1000 units tablet Take 1,000 Units by mouth 2 (two) times a week. Monday and Thursday     furosemide (LASIX) 20 MG tablet Take 1 tablet (20 mg total) by mouth daily as needed for fluid or edema. 30 tablet 3   isosorbide mononitrate (IMDUR) 30 MG 24 hr tablet Take 1 tablet (30 mg total) by mouth daily. 90 tablet 3   latanoprost (XALATAN) 0.005 % ophthalmic solution Place 1 drop into both eyes at bedtime.     metoprolol succinate (TOPROL-XL) 100 MG 24 hr tablet Take 1 tablet (100 mg total) by mouth daily. Take with or immediately following a meal. 90 tablet 3   nitroGLYCERIN (NITROSTAT) 0.4 MG SL tablet Place 1 tablet (0.4 mg total) under the tongue every 5 (five) minutes x 3 doses as needed for chest pain. 25 tablet 3   predniSONE (DELTASONE) 20 MG tablet Take 1 tablet (20 mg total) by mouth daily with breakfast. 7 tablet 0   vitamin B-12 (CYANOCOBALAMIN) 1000 MCG tablet Take 1,000 mcg by mouth daily.         ROS: Eyes - left eye blurry vision, skin - pain/pustules, CV Constitutional: Negative for fever, chills, diaphoresis, activity change, appetite change, fatigue and unexpected weight change.  HENT: Negative for congestion, sore throat, rhinorrhea, sneezing, trouble swallowing and sinus pressure.  Eyes: blurry vision of left eye. Negative for photophobia and visual disturbance.  Respiratory: Negative for cough, chest tightness, shortness of breath, wheezing and stridor.  Cardiovascular: Negative for chest pain, palpitations and leg swelling.  Gastrointestinal: Negative for nausea, vomiting, abdominal pain, diarrhea, constipation, blood in stool, abdominal distention and anal bleeding.  Genitourinary: Negative for dysuria, hematuria,  flank pain and difficulty urinating.  Musculoskeletal: Negative for myalgias, back pain, joint swelling, arthralgias and gait problem.  Skin: Negative for color change, pallor, rash and wound.  Neurological: +pain associated with rash. Negative for dizziness, tremors, weakness and light-headedness.  Hematological: Negative for adenopathy. Does not bruise/bleed easily.  Psychiatric/Behavioral: Negative for behavioral problems, confusion, sleep disturbance, dysphoric mood, decreased concentration and agitation  Visual Fields: difficult to ascertain - patient unable to  clearly articulate what he sees   Pupils:  Pharmacologically dilated at my direction before exam   Equal, brisk, no APD     Near acuity:   20/50  OD      CF      OS    TA:       Normal to palpation OU      Dilation:  OS with tropicamide  External:   OD:  Normal      OS:  Vescicles, mild eyelid edema - much improved from earlier photos.  Anterior segment exam:  dendrites OS, no corneal edema or ulcer, filaments over cornea, mild conjunctival hyperemia  Conjunctiva:  OD:  Quiet     OS:  hyperemia  Cornea:    OD: Clear, no fluorescein stain      OS: Dendrites  Anterior Chamber:   OD:  Deep/quiet     OS:  Deep/quiet  Iris:    OD:  Normal      OS:  Normal         Optic disc:  OD:    OS:  Flat, sharp, pink, healthy   0.2  Central retina--examined with indirect ophthalmoscope:   OS:  Drusen and pigment mottling; media clear, no active posterior inflammation, no evidence of acute retinal necrosis    Impression/Plan:   Herpes zoster ophthalmicus with herpetic keratitis.  - Continue Acyclovir, no role for topical therapy for keratitis except erythromycin for lubrication.  Topica antivirals do not changes the course of the condition.  Systemic anti-virals are the most effective treatment.  Periorbital vescicles - Erythromycin ophthalmic ointment to preent scarrying  Periorbital edema/cellulitis - continue  Vancomycin  Recommend reevaluation as outpatient with Dr. Sharmon Leyden discussed these findings with the nurse.  Please contact our office with any questions or concerns at [phone].   Jalene Mullet, MD

## 2021-07-27 NOTE — Assessment & Plan Note (Addendum)
-  Continue to monitor blood pressures per protocol continue home medications including amlodipine 5 mg p.o. daily, isosorbide mononitrate 30 mg p.o. daily, metoprolol succinate 100 g p.o. daily -Continue to monitor blood pressures per protocol -Last blood pressure reading was slightly elevated to 170/62

## 2021-07-27 NOTE — Assessment & Plan Note (Addendum)
-   ee above.  His herpes zoster ophthalmicus resulting in vision impairment with secondary periorbital cellulitis -Ophthalmology has been consulted and he has been initiated on Acyclovir and was given 10 mg/kg/day of Methylprednisolone and vancomycin had been ordered but ID recommends stopping the vancomycin and changing to p.o. Doxycycline and increasing the dose of Amoxicillin.  He has completed 3 days of antibiotics and his cellulitis appears to be resolved clinically so infectious disease has stopped the doxycycline and the amoxicillin. -ID had also been consulted for further evaluation recommendations and appreciate their recommendations -Check blood cultures x2 and showed No Growth to at 2 days and will need to follow-up with his PCP or ID for final blood culture readings

## 2021-07-27 NOTE — Progress Notes (Signed)
Pharmacy Antibiotic Note  Ryan [name] is a 88 y.o. male admitted on 07/27/2021 with  Zoster ophthalmicus  .  Pharmacy has been consulted for IV acyclovir dosing.  Patient with a history of pAF, tachy/brady syndrome, hyx of pacemaker, hx of endocarditis, s/p AVR, CAD, HF, HLD, asthma, hypothyroidism, CKDIII. Patient presenting with left eye pain and drainage for the past 2 weeks.  SCr 1.8 - at baseline WBC 5.9; afeb COVID/Flu neg  Plan: Acyclovir 10mg /kg (600 mg) IV q24h (NS at 50 ml/hr) Trend WBC, Fever, Renal function, & Clinical course F/u cultures, clinical course, WBC, fever De-escalate when able F/u ID recommendations F/u optho recommendations     Temp (24hrs), Avg:97.8 F (36.6 C), Min:97.7 F (36.5 C), Max:97.9 F (36.6 C)  Recent Labs  Lab 07/27/21 0950  WBC 5.9  CREATININE 1.80*    Estimated Creatinine Clearance: 23.6 mL/min (A) (by C-G formula based on SCr of 1.8 mg/dL (H)).    Allergies  Allergen Reactions   Ciprofloxacin Rash   Hydrochlorothiazide Rash   Sulfa Antibiotics Anaphylaxis   Ace Inhibitors Cough   Losartan Cough   Carvedilol Cough    Pt states it "causes him too cough."   Lisinopril Cough    Pt reports worsening cough and "feeling wheezy."   Soy Allergy Nausea And Vomiting   Antimicrobials this admission: Acyclovir 2/25 >>   Microbiology results: Pending  Thank you for allowing pharmacy to be a part of this patients care.  Lorelei Pont, PharmD, BCPS 07/27/2021 10:45 AM ED Clinical Pharmacist -  [phone]

## 2021-07-27 NOTE — TOC Progression Note (Signed)
Transition of Care [facility]) - Progression Note    Patient Details  Name: Ryan [name] MRN: [id number] Date of Birth: Jun 19, 1929  Transition of Care [facility]) CM/SW Contact  Zenon Mayo, RN Phone Number: 07/27/2021, 4:07 PM  Clinical Narrative:     Transition of Care [facility]) Screening Note   Patient Details  Name: Ryan [name] Date of Birth: November 10, 1929   Transition of Care [facility]) CM/SW Contact:    Zenon Mayo, RN Phone Number: 07/27/2021, 4:07 PM    Transition of Care Department [facility]) has reviewed patient and no TOC needs have been identified at this time. We will continue to monitor patient advancement through interdisciplinary progression rounds. If new patient transition needs arise, please place a TOC consult.          Expected Discharge Plan and Services                                                 Social Determinants of Health (SDOH) Interventions    Readmission Risk Interventions No flowsheet data found.

## 2021-07-27 NOTE — ED Triage Notes (Signed)
Pt started with left  eye pain/drainage 2 weeks ago. Pt then started having pustules to his forehead and his scalp. Pt has been seen by eye dr and primary dr. [name] has been taking erythromycin topical for his left eye, was treated with an injection of cephalosporin and steroid yesteday. He is on 3rd day of oral doxy. Pt is having worse pain in eye today and severe headache.

## 2021-07-27 NOTE — Assessment & Plan Note (Addendum)
-  Continue to monitor on telemetry -Continue with anticoagulation with apixaban 2.5 mg p.o. twice daily given his age and renal function -Continue with home Beta-Blocker with Metoprolol Succinate 100 mg p.o. daily

## 2021-07-28 DIAGNOSIS — R739 Hyperglycemia, unspecified: Secondary | ICD-10-CM

## 2021-07-28 DIAGNOSIS — D696 Thrombocytopenia, unspecified: Secondary | ICD-10-CM

## 2021-07-28 DIAGNOSIS — N184 Chronic kidney disease, stage 4 (severe): Secondary | ICD-10-CM | POA: Diagnosis not present

## 2021-07-28 DIAGNOSIS — I5042 Chronic combined systolic (congestive) and diastolic (congestive) heart failure: Secondary | ICD-10-CM | POA: Diagnosis not present

## 2021-07-28 DIAGNOSIS — E785 Hyperlipidemia, unspecified: Secondary | ICD-10-CM | POA: Diagnosis not present

## 2021-07-28 DIAGNOSIS — B0230 Zoster ocular disease, unspecified: Secondary | ICD-10-CM | POA: Diagnosis not present

## 2021-07-28 LAB — COMPREHENSIVE METABOLIC PANEL
ALT: 14 U/L (ref 0–44)
AST: 27 U/L (ref 15–41)
Albumin: 3.3 g/dL — ABNORMAL LOW (ref 3.5–5.0)
Alkaline Phosphatase: 60 U/L (ref 38–126)
Anion gap: 10 (ref 5–15)
BUN: 40 mg/dL — ABNORMAL HIGH (ref 8–23)
CO2: 24 mmol/L (ref 22–32)
Calcium: 9.3 mg/dL (ref 8.9–10.3)
Chloride: 103 mmol/L (ref 98–111)
Creatinine, Ser: 1.89 mg/dL — ABNORMAL HIGH (ref 0.61–1.24)
GFR, Estimated: 34 mL/min — ABNORMAL LOW (ref >60)
Glucose, Bld: 130 mg/dL — ABNORMAL HIGH (ref 70–99)
Potassium: 4.5 mmol/L (ref 3.5–5.1)
Sodium: 137 mmol/L (ref 135–145)
Total Bilirubin: 0.5 mg/dL (ref 0.3–1.2)
Total Protein: 5.6 g/dL — ABNORMAL LOW (ref 6.5–8.1)

## 2021-07-28 LAB — CBC
HCT: 41.5 % (ref 39.0–52.0)
Hemoglobin: 14.3 g/dL (ref 13.0–17.0)
MCH: 32.2 pg (ref 26.0–34.0)
MCHC: 34.5 g/dL (ref 30.0–36.0)
MCV: 93.5 fL (ref 80.0–100.0)
Platelets: 147 10*3/uL — ABNORMAL LOW (ref 150–400)
RBC: 4.44 MIL/uL (ref 4.22–5.81)
RDW: 12.9 % (ref 11.5–15.5)
WBC: 6.5 10*3/uL (ref 4.0–10.5)
nRBC: 0 % (ref 0.0–0.2)

## 2021-07-28 LAB — GLUCOSE, CAPILLARY
Glucose-Capillary: 173 mg/dL — ABNORMAL HIGH (ref 70–99)
Glucose-Capillary: 160 mg/dL — ABNORMAL HIGH (ref 70–99)
Glucose-Capillary: 123 mg/dL — ABNORMAL HIGH (ref 70–99)
Glucose-Capillary: 142 mg/dL — ABNORMAL HIGH (ref 70–99)

## 2021-07-28 MED ORDER — DOXYCYCLINE HYCLATE 100 MG PO TABS
100.0000 mg | ORAL_TABLET | Freq: Two times a day (BID) | ORAL | Status: DC
Start: 2021-07-29 — End: 2021-07-29
  Administered 2021-07-29: 100 mg via ORAL
  Filled 2021-07-28: qty 1

## 2021-07-28 MED ORDER — AMOXICILLIN 500 MG PO CAPS
500.0000 mg | ORAL_CAPSULE | Freq: Three times a day (TID) | ORAL | Status: DC
Start: 2021-07-28 — End: 2021-07-29
  Administered 2021-07-28 – 2021-07-29 (×2): 500 mg via ORAL
  Filled 2021-07-28 (×3): qty 1

## 2021-07-28 MED ORDER — ERYTHROMYCIN 5 MG/GM OP OINT
TOPICAL_OINTMENT | Freq: Three times a day (TID) | OPHTHALMIC | Status: DC
  Filled 2021-07-28: qty 3.5

## 2021-07-28 MED ORDER — DOXYCYCLINE HYCLATE 100 MG PO TABS
100.0000 mg | ORAL_TABLET | Freq: Two times a day (BID) | ORAL | Status: DC
  Administered 2021-07-28: 100 mg via ORAL
  Filled 2021-07-28: qty 1

## 2021-07-28 MED ORDER — ONDANSETRON HCL 4 MG PO TABS
4.0000 mg | ORAL_TABLET | Freq: Two times a day (BID) | ORAL | Status: DC
  Administered 2021-07-28 – 2021-07-29 (×2): 4 mg via ORAL
  Filled 2021-07-28 (×2): qty 1

## 2021-07-28 MED ORDER — INSULIN ASPART 100 UNIT/ML IJ SOLN
0.0000 [IU] | Freq: Three times a day (TID) | INTRAMUSCULAR | Status: DC
  Administered 2021-07-28: 2 [IU] via SUBCUTANEOUS
  Administered 2021-07-28: 1 [IU] via SUBCUTANEOUS

## 2021-07-28 MED ORDER — SODIUM CHLORIDE 0.9 % IV BOLUS
500.0000 mL | Freq: Once | INTRAVENOUS | Status: AC
  Administered 2021-07-28: 500 mL via INTRAVENOUS

## 2021-07-28 NOTE — Progress Notes (Signed)
Progress Note   Patient: Ryan [name] YQM:[id number] DOB: 08-29-1929 DOA: 07/27/2021     1 DOS: the patient was seen and examined on 07/28/2021   Brief hospital course: The patient is an 88 year old elderly Caucasian male with a past medical history significant for but not limited to PAF on anticoagulation, history of tachybradycardia syndrome, history of AVR, history of CAD, history of chronic combined systolic and diastolic CHF, hypertension, hyperlipidemia, asthma, hypothyroidism, chronic kidney disease stage IIIb, history of endocarditis as well as other comorbidities who presented to the emergency room with worsening left eye pain and drainage for last 2 weeks.  Symptoms started about 2 weeks ago when he started having some dry eyes and went to his ophthalmologist a few days ago for redness and irritation.  At that time he was given doxycycline 100 mg p.o. twice daily and azithromycin eyedrops.  Given his continued worsening he saw his PCP received antibiotics and steroids for presumed facial soft tissue infection.  He progressively had worsening eye pain and drainage for last 2 weeks and several days ago he started having pustules on his forehead and scalp when he woke up this a.m.  Yesterday at his PCPs office he was given ceftriaxone IM and Depo-Medrol today continues to have symptoms and presented.  His CBC was normal CMP was stable with a GFR and his creatinine of 1.8 given his history of CKD stage IIIb.  A CT of the orbits was done and showed  "Left preseptal soft tissue swelling. No acute intraorbital finding on either side."  The EDP spoke with the hospitalist team and they recommended transfer to [name] given the ophthalmology request.  He was given pain management, prednisolone and started on acyclovir.  ID was also consulted who recommended acyclovir and vancomycin.  I have notified Dr. Posey Pronto of ophthalmology that the patient has arrived to [facility].   **Interim  History Patient's eye looks a lot better and is lesions are clearing up.  Denies any nausea or vomiting.  Patient started hallucinating a little bit overnight given his prednisone and lack of sleep but is doing better today.  He is awake and alert.  Renal function slowly went up so we will need to continue monitor carefully.  Per ID continuing IV acyclovir we will add the doxycycline per ID recommendations.  Ophthalmology evaluated and recommending continuing acyclovir with no palpable therapy for keratitis except for erythromycin for lubrication.  Assessment and Plan: * Herpes zoster ophthalmicus- (present on admission) - This patient's presenting complaint and he is currently afebrile.  Has been seen in the last few weeks for this by his PCP and went to urgent care yesterday.  Received 3 days of erythromycin and yesterday received a dose of ceftriaxone IM and given steroids. -Given his worsening he presented to the [place] ED and was transferred to [name] by ophthalmology request -Consulted Dr. Posey Pronto in Ophthalmology for  evaluation and recs of eye drops -His diseases has also been consulted and they are recommending acyclovir and we have also initiated IV vancomycin per their request -CT of the orbits done as above -Currently not septic on admission he is afebrile and has no white count -Further care per specialists and ophthalmology just recommends erythromycin topically to prevent scarring and recommends continue IV acyclovir; -Gentle IV fluid hydration with 50 MLS per hour given his history of combined systolic and diastolic CHF -Initiated on prednisolone 60 mg p.o. daily -Continued pain control was given IV morphine 4 mg  x 2 and also fentanyl 50 mcg IV; will change to fentanyl 12.5 to 25 mg every 2 as needed -Given that patient has a pacer they are checking to see if an MRI is compatible or not but likely does not need an MRI given that he is improving -EDP exam done and he had  dendrites on fluorescein stain with eyelid vesicles and patient admits to having some gross vision loss when compared to the right. -Ophthalmology evaluated and recommending continuing the erythromycin as noted and they were able to see dendrites again and feel that he has herpetic keratitis    Hyperglycemia -Blood sugars are likely elevated in the setting of steroid Demargination -We will place on sensitive NovoLog/scale insulin AC -Check hemoglobin A1c in the morning continue monitor blood sugars carefully and if necessary make further insulin adjustments -CBGs ranging from 116- 173  Thrombocytopenia ([name]) -Patient's Platelet Count went from 157 -> 147 -Continue to Monitor for S/Sx of Bleeding; No overt bleeding Noted -Repeat CBC in the AM   CKD (chronic kidney disease) stage 4, GFR 15-29 ml/min (HCC) -Has a Documented Stage 4 CKD per wife and sees Dr. Gean Quint in the outpatient -Patient is BUN/creatinine is now 35/1.80 -> 40/1.89 -Avoid nephrotoxic medications, contrast dyes, hypotension and dehydration and renally dose medications -Patient is going to be placed on IV vancomycin and IV acyclovir so we will start gentle IV fluid hydration with 50 mL/hr given that he has a history of chronic combined CHF -Continue monitor and trend and repeat CMP in a.m.  Periorbital cellulitis of left eye - See above.  His herpes zoster ophthalmicus resulting in vision impairment with secondary periorbital cellulitis -Ophthalmology has been consulted and he has been initiated on Acyclovir and was given 10 mg/kg/day of Methylprednisolone and vancomycin had been ordered but ID recommends stopping the vancomycin and changing to p.o. Doxycycline and increasing the dose of Amoxicillin -ID has also been consulted for further evaluation recommendations and appreciate their recommendations -Check blood cultures x2 and showed No Growth to Date at <24 hours   HLD (hyperlipidemia)- (present on  admission) -Resume home Atorvastatin 40 mg p.o. nightly  Presence of permanent cardiac pacemaker- (present on admission) - Pacer in place and he has a history of a TAVR with TAVR Endocarditis x2  Hypertension- (present on admission) -Continue to monitor blood pressures per protocol continue home medications including amlodipine 5 mg p.o. daily, isosorbide mononitrate 30 mg p.o. daily, metoprolol succinate 100 g p.o. daily -Continue to monitor blood pressures per protocol -Last blood pressure reading was slightly elevated to 186/84   Chronic combined systolic (congestive) and diastolic (congestive) heart failure ([place])- (present on admission) -Continue monitor volume status carefully -Currently not decompensated -Resume home cardiac medications as above and continue Lasix 20 g p.o. daily as needed for fluid or edema -Strict I's and O's and Daily Weights; Patient is +671.1 Liters since admission  -Continue monitor for signs and symptoms of volume overload   PAF (paroxysmal atrial fibrillation) ([place])- (present on admission) -Continue to monitor on telemetry -Continue with anticoagulation with apixaban 2.5 mg p.o. twice daily given his age and renal function -Continue with home Beta-Blocker with Metoprolol Succinate 100 mg p.o. daily  Coronary artery disease of native artery of native heart with stable angina pectoris ([place])- (present on admission) -Has also history of TAVR -Denies any chest pain -Continue home medications including Isosorbide Mononitrate 30 mg p.o. daily, Metoprolol Succinate 100 mg p.o. daily, Nitroglycerin 0.4 mg sublingually every 5 minutes as  needed and also resume his home Atorvastatin 40 g p.o. daily   Subjective: Seen and examined at bedside and states that he did not get a sleep last night.  Was hallucinating a little bit given that he is on steroids and pain meds but this is now resolved.  No nausea or vomiting.  Denies any chest pain or shortness breath.  His face  looks better and he is feeling little bit better just tired.  No other concerns or complaints at this time.  Physical Exam: Vitals:   07/27/21 2127 07/27/21 2244 07/28/21 0032 07/28/21 0456  BP: (!) 149/69 (!) 149/69 (!) 156/71 (!) 186/84  Pulse: 65 65 68 71  Resp: 16 16 16 19   Temp: (!) 97.5 F (36.4 C) (!) 97.5 F (36.4 C) (!) 97.5 F (36.4 C) 98.2 F (36.8 C)  TempSrc: Oral Oral Oral Oral  SpO2: 94%   90%  Weight:  59 kg  58.4 kg  Height:  5\' 7"  (1.702 m)     Examination: Physical Exam:  Constitutional: WN/WD elderly Caucasian male in no acute distress Eyes: Has some left eye swelling and some vesicles noted in the V1 distribution is erythema is improving.  His face is looking better today Respiratory: Diminished to auscultation bilaterally with coarse breath sounds, no wheezing, rales, rhonchi or crackles. Normal respiratory effort and patient is not tachypenic. No accessory muscle use.  Unlabored breathing Cardiovascular: RRR, no murmurs / rubs / gallops. S1 and S2 auscultated. No extremity edema.  Abdomen: Soft, non-tender, non-distended. Bowel sounds positive.  GU: Deferred. Musculoskeletal: No clubbing / cyanosis of digits/nails. No joint deformity upper and lower extremities.  Skin: Facial vesicles are getting better and his erythema is improved  Data Reviewed:  -Independently reviewed and interpreted the patient's clinical laboratory data and he has a slightly elevated glucose and has a BUNs/creatinine 40/1.89 and a platelet count of 147  Family Communication: I have discussed the case with the patient's wife and the patient's son-in-law   Disposition: Status is: Inpatient Remains inpatient appropriate because: Remains on IV Acyclovir   Planned Discharge Destination: Home with Home Health  DVT Prophylaxis: Anticoagulant with apixaban 2.5 mg p.o. twice daily  Author: Raiford Noble, DO [facility]  07/28/2021 12:38 PM  For on call review  [email or web address].

## 2021-07-28 NOTE — Assessment & Plan Note (Addendum)
-  Blood sugars are likely elevated in the setting of steroid Demargination but now given that he has Prediabetes will need close PCP follow-up -We will place on sensitive NovoLog/scale insulin AC -Checked a hemoglobin A1c and it was 5.8  -Continue monitor blood sugars carefully and if necessary make further insulin adjustments -CBGs ranging from 99-173

## 2021-07-28 NOTE — Hospital Course (Addendum)
The patient is an 88 year old elderly Caucasian male with a past medical history significant for but not limited to PAF on anticoagulation, history of tachybradycardia syndrome, history of AVR, history of CAD, history of chronic combined systolic and diastolic CHF, hypertension, hyperlipidemia, asthma, hypothyroidism, chronic kidney disease stage IIIb, history of endocarditis as well as other comorbidities who presented to the emergency room with worsening left eye pain and drainage for last 2 weeks.  Symptoms started about 2 weeks ago when he started having some dry eyes and went to his ophthalmologist a few days ago for redness and irritation.  At that time he was given doxycycline 100 mg p.o. twice daily and azithromycin eyedrops.  Given his continued worsening he saw his PCP received antibiotics and steroids for presumed facial soft tissue infection.  He progressively had worsening eye pain and drainage for last 2 weeks and several days ago he started having pustules on his forehead and scalp when he woke up this a.m.  Yesterday at his PCPs office he was given ceftriaxone IM and Depo-Medrol today continues to have symptoms and presented.  His CBC was normal CMP was stable with a GFR and his creatinine of 1.8 given his history of CKD stage IIIb.  A CT of the orbits was done and showed  "Left preseptal soft tissue swelling. No acute intraorbital finding on either side."  The EDP spoke with the hospitalist team and they recommended transfer to [name] given the ophthalmology request.  He was given pain management, prednisolone and started on acyclovir.  ID was also consulted who recommended acyclovir and vancomycin.  I have notified Dr. Posey Pronto of ophthalmology that the patient has arrived to [facility].   **Interim History Patient's eye looks a lot better and is lesions are clearing up.  Denies any nausea or vomiting.  Patient started hallucinating a little bit overnight given his prednisone and lack  of sleep but is doing better today.  He is awake and alert.  Renal function slowly went up so we will need to continue monitor carefully.  Per ID continuing IV acyclovir we will add the doxycycline per ID recommendations.  Ophthalmology evaluated and recommending continuing acyclovir with no topical therapy for keratitis except for erythromycin for lubrication.  Patient is acyclovir was changed to valacyclovir 1000 mg p.o. daily by infectious diseases.  He will be sent home on a prednisone taper given that he is improved and that his vesicles or crusting.  His vision has improved slightly and he is completed 30 days of IV acyclovir and ID recommended discharging on 7 days total of antivirals and he will be continued on erythromycin.  Renal function improved slightly.  He has been deemed stable for discharge and was given Tylenol 3 for pain control given that he has adverse effects with narcotics.  Given that his cellulitis is improved they have stopped his doxycycline and have transitioned him back to his suppressive dose of amoxicillin at discharge.  All questions were answered to the patient's family satisfaction and he has been deemed stable for discharge with outpatient follow-up with the specialist listed as above

## 2021-07-28 NOTE — Evaluation (Signed)
Physical Therapy Evaluation and Discharge Patient Details Name: Ryan [name] MRN: [id number] DOB: 07-Nov-1929 Today's Date: 07/28/2021  History of Present Illness  Pt is an 88 y/o M presenting to ED on 2/25 for L eye drainage x2 weeks, found to have herpes zoster opthalmicus. PMH includes A fib with RVR, pacemaker, hypertensive heart failure, HLD, MI with coronary angiography, prosthetic heart valve, and endocarditis.  Clinical Impression  PTA, pt lives with his spouse, uses a [facility] for household mobility vs Rollator for community mobility and requires assist for IADL's. Pt seems to be fairly close to his functional baseline, however, does present with decreased vision. Pt has macular degeneration at baseline, and left eye is typically the "better" one, however, now has decreased L eye central vision. Pt ambulating 300 feet with a walker at a min guard assist level; cues provided for environmental navigation. Discussed with pt spouse and she feels comfortable providing this level of assist/guidance and reports their home is already set up ideally. Recommended continued activity as tolerated. No further acute PT needs. Thank you for this consult.     Recommendations for follow up therapy are one component of a multi-disciplinary discharge planning process, led by the attending physician.  Recommendations may be updated based on patient status, additional functional criteria and insurance authorization.  Follow Up Recommendations No PT follow up    Assistance Recommended at Discharge Frequent or constant Supervision/Assistance  Patient can return home with the following  A little help with walking and/or transfers;A little help with bathing/dressing/bathroom;Direct supervision/assist for medications management;Assist for transportation;Help with stairs or ramp for entrance    Equipment Recommendations None recommended by PT  Recommendations for Other Services       Functional Status  Assessment Patient has had a recent decline in their functional status and demonstrates the ability to make significant improvements in function in a reasonable and predictable amount of time.     Precautions / Restrictions Precautions Precautions: Fall Restrictions Weight Bearing Restrictions: No      Mobility  Bed Mobility Overal bed mobility: Modified Independent                  Transfers Overall transfer level: Needs assistance Equipment used: Straight cane Transfers: Sit to/from Stand Sit to Stand: Supervision                Ambulation/Gait Ambulation/Gait assistance: Min guard Gait Distance (Feet): 300 Feet Assistive device: Rolling walker (2 wheels) Gait Pattern/deviations: Step-through pattern, Decreased stride length, Narrow base of support Gait velocity: decreased Gait velocity interpretation: <1.8 ft/sec, indicate of risk for recurrent falls   General Gait Details: Cues provided for wider BOS, upright posture and walker proximity. Intermittent walker assist for steering, verbal cueing for environmental navigation due to low vision  Stairs            Wheelchair Mobility    Modified Rankin (Stroke Patients Only)       Balance Overall balance assessment: Needs assistance Sitting-balance support: Feet supported Sitting balance-Leahy Scale: Good Sitting balance - Comments: able to reach outside BOS without LOB   Standing balance support: During functional activity, Reliant on assistive device for balance, Single extremity supported Standing balance-Leahy Scale: Poor Standing balance comment: requires single and bilateral UE support at times during ambulation and dynamic standing                             Pertinent Vitals/Pain Pain Assessment Pain  Assessment: Faces Faces Pain Scale: Hurts even more Pain Location: L eye Pain Descriptors / Indicators: Discomfort, Constant Pain Intervention(s): Monitored during session     Home Living Family/patient expects to be discharged to:: Private residence Living Arrangements: Spouse/significant other Available Help at Discharge: Available 24 hours/day Type of Home: House Home Access: Stairs to enter Entrance Stairs-Rails: Right Entrance Stairs-Number of Steps: 3   Home Layout: Two level;Able to live on main level with bedroom/bathroom Home Equipment: Shower seat;Cane - single point;Rollator (4 wheels) Additional Comments: pt does not go upstairs    Prior Function Prior Level of Function : Independent/Modified Independent             Mobility Comments: uses cane in house, rollator for community ADLs Comments: does IADLs     Hand Dominance   Dominant Hand: Right    Extremity/Trunk Assessment   Upper Extremity Assessment Upper Extremity Assessment: Defer to OT evaluation    Lower Extremity Assessment Lower Extremity Assessment: Overall WFL for tasks assessed    Cervical / Trunk Assessment Cervical / Trunk Assessment: Kyphotic  Communication   Communication: No difficulties  Cognition Arousal/Alertness: Awake/alert Behavior During Therapy: WFL for tasks assessed/performed Overall Cognitive Status: Impaired/Different from baseline Area of Impairment: Memory, Following commands                     Memory: Decreased short-term memory Following Commands: Follows one step commands with increased time, Follows one step commands inconsistently       General Comments: spouse reports pt is having some confusion, seeing things that are not in the room, possibly related to decreased vision        General Comments      Exercises     Assessment/Plan    PT Assessment Patient does not need any further PT services  PT Problem List Decreased strength;Decreased activity tolerance;Decreased balance;Decreased mobility       PT Treatment Interventions      PT Goals (Current goals can be found in the Care Plan section)  Acute Rehab PT  Goals Patient Stated Goal: go home PT Goal Formulation: All assessment and education complete, DC therapy    Frequency       Co-evaluation               AM-PAC PT "6 Clicks" Mobility  Outcome Measure Help needed turning from your back to your side while in a flat bed without using bedrails?: None Help needed moving from lying on your back to sitting on the side of a flat bed without using bedrails?: None Help needed moving to and from a bed to a chair (including a wheelchair)?: A Little Help needed standing up from a chair using your arms (e.g., wheelchair or bedside chair)?: A Little Help needed to walk in hospital room?: A Little Help needed climbing 3-5 steps with a railing? : A Little 6 Click Score: 20    End of Session Equipment Utilized During Treatment: Gait belt Activity Tolerance: Patient tolerated treatment well Patient left: in bed;with call bell/phone within reach;with bed alarm set Nurse Communication: Mobility status PT Visit Diagnosis: Unsteadiness on feet (R26.81);Difficulty in walking, not elsewhere classified (R26.2)    Time: 0370-4888 PT Time Calculation (min) (ACUTE ONLY): 20 min   Charges:   PT Evaluation $PT Eval Low Complexity: [place], PT, DPT Acute Rehabilitation Services Pager [phone] Office [phone]   Deno Etienne 07/28/2021, 4:42 PM

## 2021-07-28 NOTE — Evaluation (Signed)
Occupational Therapy Evaluation Patient Details Name: Ryan [name] MRN: [id number] DOB: 1930-01-06 Today's Date: 07/28/2021   History of Present Illness Pt is an 88 y/o M presenting to ED on 2/25 for L eye drainage x2 weeks, found to have herpes zoster opthalmicus. PMH includes A fib with RVR, pacemaker, hypertensive heart failure, HLD, MI with coronary angiography, prosthetic heart valve, and endocarditis.   Clinical Impression   Pt reports independence at baseline with ADLs, uses cane/rollator for functional mobility. Pt lives with spouse who can provide assist at d/c. Currently, pt min-mod A for ADLs, mod I for bed mobility, and min guard for transfers and in-room ambulation with SPC. Pt reporting blurry vision, sees more peripherally in L eye and more centrally in R eye. Pt with dizziness throughout session, BP 171/84 sitting EOB and 159/97 after toilet transfer. Pt presenting with impairments listed below, will follow acutely. Recommend d/c home with [place] pending progress.      Recommendations for follow up therapy are one component of a multi-disciplinary discharge planning process, led by the attending physician.  Recommendations may be updated based on patient status, additional functional criteria and insurance authorization.   Follow Up Recommendations  Home health OT    Assistance Recommended at Discharge Intermittent Supervision/Assistance  Patient can return home with the following A little help with walking and/or transfers;A little help with bathing/dressing/bathroom;Help with stairs or ramp for entrance;Assistance with cooking/housework    Functional Status Assessment  Patient has had a recent decline in their functional status and demonstrates the ability to make significant improvements in function in a reasonable and predictable amount of time.  Equipment Recommendations  None recommended by OT;Other (comment) (pt has all needed DME)    Recommendations for Other  Services PT consult     Precautions / Restrictions Precautions Precautions: Fall Restrictions Weight Bearing Restrictions: No      Mobility Bed Mobility Overal bed mobility: Modified Independent                  Transfers Overall transfer level: Needs assistance Equipment used: Straight cane Transfers: Sit to/from Stand Sit to Stand: Min guard                  Balance Overall balance assessment: Needs assistance Sitting-balance support: Feet supported Sitting balance-Leahy Scale: Good Sitting balance - Comments: able to reach outside BOS without LOB   Standing balance support: During functional activity, Reliant on assistive device for balance, Single extremity supported Standing balance-Leahy Scale: Poor Standing balance comment: requires single and bilateral UE support at times during ambulation and dynamic standing                           ADL either performed or assessed with clinical judgement   ADL Overall ADL's : Needs assistance/impaired Eating/Feeding: Set up;Sitting   Grooming: Minimal assistance;Standing;Sitting   Upper Body Bathing: Minimal assistance;Standing;Sitting   Lower Body Bathing: Sitting/lateral leans;Sit to/from stand;Minimal assistance   Upper Body Dressing : Minimal assistance;Sitting   Lower Body Dressing: Minimal assistance;Sitting/lateral leans   Toilet Transfer: Min guard;Grab bars;Ambulation;Regular Toilet   Toileting- Water quality scientist and Hygiene: Moderate assistance;Sit to/from stand       Functional mobility during ADLs: Min guard;Cane       Vision Baseline Vision/History: 6 Macular Degeneration Patient Visual Report: Blurring of vision Vision Assessment?: Vision impaired- to be further tested in functional context Additional Comments: pt reports good peripheral vision in L eye, however  vision is better more centrally in R eye     Perception     Praxis      Pertinent Vitals/Pain Pain  Assessment Pain Assessment: Faces Pain Score: 7  Faces Pain Scale: Hurts even more Pain Location: L eye Pain Descriptors / Indicators: Discomfort, Constant Pain Intervention(s): Limited activity within patient's tolerance, Monitored during session     Hand Dominance Right   Extremity/Trunk Assessment Upper Extremity Assessment Upper Extremity Assessment: Overall WFL for tasks assessed   Lower Extremity Assessment Lower Extremity Assessment: Defer to PT evaluation   Cervical / Trunk Assessment Cervical / Trunk Assessment: Kyphotic   Communication Communication Communication: No difficulties   Cognition Arousal/Alertness: Awake/alert Behavior During Therapy: WFL for tasks assessed/performed Overall Cognitive Status: Impaired/Different from baseline Area of Impairment: Memory, Following commands                     Memory: Decreased short-term memory Following Commands: Follows one step commands with increased time, Follows one step commands inconsistently       General Comments: spouse reports pt is having some confusion, seeing things that are not in the room, possibly related to decreased vision     General Comments  pt reporting dizziness sitting EOB BP taken and 171/84, and 159/97 after toilet transfer.    Exercises     Shoulder Instructions      Home Living Family/patient expects to be discharged to:: Private residence Living Arrangements: Spouse/significant other Available Help at Discharge: Available 24 hours/day Type of Home: House Home Access: Stairs to enter [employer] of Steps: 3 Entrance Stairs-Rails: Right Home Layout: Two level;Able to live on main level with bedroom/bathroom     Bathroom Shower/Tub: Tub/shower unit;Curtain   Biochemist, clinical: Standard     Home Equipment: Shower seat;Cane - single point;Rollator (4 wheels)   Additional Comments: pt does not go upstairs      Prior Functioning/Environment Prior Level of  Function : Independent/Modified Independent             Mobility Comments: uses cane in house, rollator for community ADLs Comments: does IADLs        OT Problem List: Decreased strength;Decreased range of motion;Decreased activity tolerance;Impaired balance (sitting and/or standing);Impaired vision/perception;Decreased safety awareness;Cardiopulmonary status limiting activity      OT Treatment/Interventions: Self-care/ADL training;Therapeutic exercise;Energy conservation;DME and/or AE instruction;Visual/perceptual remediation/compensation;Patient/family education;Balance training;Therapeutic activities    OT Goals(Current goals can be found in the care plan section) Acute Rehab OT Goals Patient Stated Goal: none stated OT Goal Formulation: With patient Time For Goal Achievement: 08/11/21 Potential to Achieve Goals: Good ADL Goals Pt Will Perform Upper Body Dressing: with modified independence;sitting Pt Will Perform Lower Body Dressing: with supervision;sit to/from stand Pt Will Transfer to Toilet: with supervision;regular height toilet;ambulating Pt Will Perform Tub/Shower Transfer: Tub transfer;ambulating;shower seat;with min guard assist  OT Frequency: Min 2X/week    Co-evaluation              AM-PAC OT "6 Clicks" Daily Activity     Outcome Measure Help from another person eating meals?: None Help from another person taking care of personal grooming?: A Little Help from another person toileting, which includes using toliet, bedpan, or urinal?: A Little Help from another person bathing (including washing, rinsing, drying)?: A Lot Help from another person to put on and taking off regular upper body clothing?: A Little Help from another person to put on and taking off regular lower body clothing?: A Lot 6 Click Score: 17  End of Session Equipment Utilized During Treatment: Gait belt;Other (comment) (cane) Nurse Communication: Mobility status;Other (comment) (RN  notified of BP and pt's dizziness)  Activity Tolerance: Patient limited by fatigue Patient left: in bed;with call bell/phone within reach;with bed alarm set;Other (comment) (IV team in room)  OT Visit Diagnosis: Unsteadiness on feet (R26.81);Other abnormalities of gait and mobility (R26.89);Muscle weakness (generalized) (M62.81);Low vision, both eyes (H54.2)                Time: 2712-9290 OT Time Calculation (min): 30 min Charges:  OT General Charges $OT Visit: 1 Visit OT Evaluation $OT Eval Moderate Complexity: 1 Mod OT Treatments $Self Care/Home Management : 8-22 mins  Lynnda Child, OTD, OTR/L Acute Rehab [phone]) [phone] 07/28/2021, 10:40 AM

## 2021-07-28 NOTE — Assessment & Plan Note (Signed)
-  Patient's Platelet Count went from 157 -> 147 -Continue to Monitor for S/Sx of Bleeding; No overt bleeding Noted -Repeat CBC in the AM

## 2021-07-28 NOTE — Progress Notes (Addendum)
[facility]    Date of Admission:  07/27/2021   Total days of antibiotics 2/iv acyclovir           ID: Ryan [name] is a 88 y.o. male with  HZO Principal Problem:   Herpes zoster ophthalmicus Active Problems:   Coronary artery disease of native artery of native heart with stable angina pectoris (HCC)   PAF (paroxysmal atrial fibrillation) (HCC)   Chronic combined systolic (congestive) and diastolic (congestive) heart failure (HCC)   Hypertension   Presence of permanent cardiac pacemaker   HLD (hyperlipidemia)   Periorbital cellulitis of left eye   CKD (chronic kidney disease) stage 4, GFR 15-29 ml/min (HCC)   Thrombocytopenia (HCC)   Hyperglycemia    Subjective: Patient reports improved pain, can see better, less swelling to left eye lid. Was seen by ophtho last night  Medications:   amLODipine  5 mg Oral Daily   amoxicillin  500 mg Oral Q8H   apixaban  2.5 mg Oral BID   atorvastatin  40 mg Oral QPM   docusate sodium  100 mg Oral BID   doxycycline  100 mg Oral Q12H   erythromycin   Left Eye Q8H   insulin aspart  0-9 Units Subcutaneous TID WC   isosorbide mononitrate  30 mg Oral Daily   metoprolol succinate  100 mg Oral Daily   prednisoLONE  1 mg/kg/day Oral Daily   vitamin B-12  1,000 mcg Oral Daily    Objective: Vital signs in last 24 hours: Temp:  [97.5 F (36.4 C)-98.2 F (36.8 C)] 97.6 F (36.4 C) (02/26 1325) Pulse Rate:  [60-71] 60 (02/26 1325) Resp:  [11-19] 11 (02/26 1325) BP: (136-186)/(66-84) 151/76 (02/26 1325) SpO2:  [90 %-96 %] 90 % (02/26 0456) Weight:  [58.4 kg-59 kg] 58.4 kg (02/26 0456)  Physical Exam  Constitutional: He is oriented to person, place, and time. He appears well-developed and well-nourished. No distress.  HENT: eyelid improvement with swelling, some drying of vesicles. Vesicle like area on forehead and apex of head still present Mouth/Throat: Oropharynx is clear and moist. No oropharyngeal  exudate. Small vesicles to hard palate still present Cardiovascular: Normal rate, regular rhythm and systolic murmur present. Exam reveals no gallop and no friction rub.  No murmur heard.  Pulmonary/Chest: Effort normal and breath sounds normal. No respiratory distress. He has no wheezes.  Abdominal: Soft. Bowel sounds are normal. He exhibits no distension. There is no tenderness.  Lymphadenopathy:  He has no cervical adenopathy.  Neurological: He is alert and oriented to person, place, and time.  Skin: Skin is warm and dry. No rash noted. No erythema.  Psychiatric: He has a normal mood and affect. His behavior is normal.    Lab Results Recent Labs    07/27/21 0950 07/28/21 0239  WBC 5.9 6.5  HGB 15.6 14.3  HCT 48.1 41.5  NA 138 137  K 4.3 4.5  CL 102 103  CO2 27 24  BUN 35* 40*  CREATININE 1.80* 1.89*   Liver Panel Recent Labs    07/27/21 0950 07/28/21 0239  PROT 6.3* 5.6*  ALBUMIN 4.2 3.3*  AST 24 27  ALT 14 14  ALKPHOS 73 60  BILITOT 0.5 0.5   Sedimentation Rate No results for input(s): ESRSEDRATE in the last 72 hours. C-Reactive Protein No results for input(s): CRP in the last 72 hours.  Microbiology: reviewed Studies/Results: CT Orbits W Contrast  Result Date: 07/27/2021 CLINICAL DATA:  Monocular  vision loss. Herpes zoster with red swollen left eye EXAM: CT ORBITS WITH CONTRAST TECHNIQUE: Multidetector CT images was performed according to the standard protocol following intravenous contrast administration. RADIATION DOSE REDUCTION: This exam was performed according to the departmental dose-optimization program which includes automated exposure control, adjustment of the mA and/or kV according to patient size and/or use of iterative reconstruction technique. CONTRAST:  25mL OMNIPAQUE IOHEXOL 300 MG/ML  SOLN COMPARISON:  No pertinent prior exam. FINDINGS: Orbits: Preseptal soft tissue swelling on the left. Bilateral cataract resection and senile calcifications of  the globes. No evidence of intra-ocular collection or scleral thickening. Unremarkable optic nerve sheath complexes, lacrimal glands, muscles, ophthalmic veins, and orbital fat. Visible paranasal sinuses: Clear Soft tissues: Left preseptal soft tissue swelling is noted above. Osseous: Negative for fracture or lesion. Limited intracranial: Negative IMPRESSION: Left preseptal soft tissue swelling. No acute intraorbital finding on either side. Electronically Signed   By: Jorje Guild M.D.   On: 07/27/2021 11:07     Assessment/Plan: HZO = continue on IV acyclovir. Would recommend to increase IV F by addn 568mL to keep from causing AKI. Plan to continue with additional day of IV acyclovir on 2/27 before switching to orals to finish 7 day course..can also do one more day of steroids then stop on Tuesday. Continue on contact isolation. Only 1 dermatome involvement. Continue on tolical erythromycin for lubrication  Cellulitis = appears improving, difficult to tell if he is having secondary bacterial infection -since he was starting treatment prior to admit. I would not give vanco given his kidney function and that he is improving. Can increase his amox 500mg  TID (take bid for endocarditis-chronic suppression) and doxycycline 100mg  bid - would treat short course ?  3 days.Marland Kitchen appears much, much improved  Ckd4 = watch kidney function with receiving acyclovir. Consider increasing IV fluid by 552mL today  Nauseau/vomiting = likely from doxy today. Will schedule with food and pre-medicate with anti-emetics  [facility] Pager: [phone]  07/28/2021, 1:59 PM

## 2021-07-29 ENCOUNTER — Other Ambulatory Visit (HOSPITAL_COMMUNITY): Payer: Self-pay

## 2021-07-29 DIAGNOSIS — R7303 Prediabetes: Secondary | ICD-10-CM

## 2021-07-29 LAB — CBC WITH DIFFERENTIAL/PLATELET
Abs Immature Granulocytes: 0.05 10*3/uL (ref 0.00–0.07)
Basophils Absolute: 0 10*3/uL (ref 0.0–0.1)
Basophils Relative: 0 %
Eosinophils Absolute: 0 10*3/uL (ref 0.0–0.5)
Eosinophils Relative: 0 %
HCT: 42.7 % (ref 39.0–52.0)
Hemoglobin: 14.3 g/dL (ref 13.0–17.0)
Immature Granulocytes: 1 %
Lymphocytes Relative: 9 %
Lymphs Abs: 0.8 10*3/uL (ref 0.7–4.0)
MCH: 31.9 pg (ref 26.0–34.0)
MCHC: 33.5 g/dL (ref 30.0–36.0)
MCV: 95.3 fL (ref 80.0–100.0)
Monocytes Absolute: 0.8 10*3/uL (ref 0.1–1.0)
Monocytes Relative: 9 %
Neutro Abs: 7.5 10*3/uL (ref 1.7–7.7)
Neutrophils Relative %: 81 %
Platelets: 137 10*3/uL — ABNORMAL LOW (ref 150–400)
RBC: 4.48 MIL/uL (ref 4.22–5.81)
RDW: 13 % (ref 11.5–15.5)
WBC: 9.1 10*3/uL (ref 4.0–10.5)
nRBC: 0 % (ref 0.0–0.2)

## 2021-07-29 LAB — PHOSPHORUS: Phosphorus: 3.8 mg/dL (ref 2.5–4.6)

## 2021-07-29 LAB — COMPREHENSIVE METABOLIC PANEL
ALT: 16 U/L (ref 0–44)
AST: 32 U/L (ref 15–41)
Albumin: 3.1 g/dL — ABNORMAL LOW (ref 3.5–5.0)
Alkaline Phosphatase: 54 U/L (ref 38–126)
Anion gap: 9 (ref 5–15)
BUN: 36 mg/dL — ABNORMAL HIGH (ref 8–23)
CO2: 22 mmol/L (ref 22–32)
Calcium: 9 mg/dL (ref 8.9–10.3)
Chloride: 107 mmol/L (ref 98–111)
Creatinine, Ser: 1.7 mg/dL — ABNORMAL HIGH (ref 0.61–1.24)
GFR, Estimated: 38 mL/min — ABNORMAL LOW (ref >60)
Glucose, Bld: 102 mg/dL — ABNORMAL HIGH (ref 70–99)
Potassium: 4.4 mmol/L (ref 3.5–5.1)
Sodium: 138 mmol/L (ref 135–145)
Total Bilirubin: 0.5 mg/dL (ref 0.3–1.2)
Total Protein: 5.3 g/dL — ABNORMAL LOW (ref 6.5–8.1)

## 2021-07-29 LAB — MAGNESIUM: Magnesium: 1.9 mg/dL (ref 1.7–2.4)

## 2021-07-29 LAB — HEMOGLOBIN A1C
Hgb A1c MFr Bld: 5.8 % — ABNORMAL HIGH (ref 4.8–5.6)
Mean Plasma Glucose: 119.76 mg/dL

## 2021-07-29 LAB — GLUCOSE, CAPILLARY
Glucose-Capillary: 103 mg/dL — ABNORMAL HIGH (ref 70–99)
Glucose-Capillary: 99 mg/dL (ref 70–99)

## 2021-07-29 MED ORDER — VALACYCLOVIR HCL 500 MG PO TABS: 1000.0000 mg | ORAL_TABLET | Freq: Every day | ORAL | Status: DC

## 2021-07-29 MED ORDER — POLYETHYLENE GLYCOL 3350 17 GM/SCOOP PO POWD
17.0000 g | Freq: Every day | ORAL | 0 refills | Status: DC | PRN
  Filled 2021-07-29: qty 238, 14d supply, fill #0

## 2021-07-29 MED ORDER — PREDNISONE 10 MG (21) PO TBPK
ORAL_TABLET | ORAL | 0 refills | Status: DC
  Filled 2021-07-29: qty 21, 6d supply, fill #0

## 2021-07-29 MED ORDER — ACETAMINOPHEN-CODEINE #3 300-30 MG PO TABS
1.0000 | ORAL_TABLET | Freq: Three times a day (TID) | ORAL | 0 refills | Status: DC | PRN
Start: 2021-07-29 — End: 2021-08-02
  Filled 2021-07-29: qty 12, 4d supply, fill #0

## 2021-07-29 MED ORDER — VALACYCLOVIR HCL 1 G PO TABS
1000.0000 mg | ORAL_TABLET | Freq: Every day | ORAL | 0 refills | Status: DC
  Filled 2021-07-29: qty 4, 4d supply, fill #0

## 2021-07-29 MED ORDER — VALACYCLOVIR HCL 500 MG PO TABS
1000.0000 mg | ORAL_TABLET | Freq: Every day | ORAL | 0 refills | Status: AC
  Filled 2021-07-29: qty 8, 4d supply, fill #0

## 2021-07-29 MED ORDER — AMOXICILLIN 500 MG PO CAPS
500.0000 mg | ORAL_CAPSULE | Freq: Two times a day (BID) | ORAL | Status: DC
  Filled 2021-07-29: qty 1

## 2021-07-29 MED ORDER — DEXTROSE 5 % IV SOLN
10.0000 mg/kg | Freq: Once | INTRAVENOUS | Status: AC
  Administered 2021-07-29: 600 mg via INTRAVENOUS
  Filled 2021-07-29: qty 12

## 2021-07-29 MED ORDER — ONDANSETRON HCL 4 MG PO TABS
4.0000 mg | ORAL_TABLET | Freq: Four times a day (QID) | ORAL | 0 refills | Status: AC | PRN
  Filled 2021-07-29: qty 20, 5d supply, fill #0

## 2021-07-29 NOTE — Progress Notes (Signed)
Mobility Specialist Criteria Algorithm Info.   07/29/21 1200  Mobility  Activity Ambulated with assistance in hallway  Range of Motion/Exercises Active;All extremities  Level of Assistance Modified independent, requires aide device or extra time  Assistive Device Front wheel walker  Distance Ambulated (ft) 340 ft  Activity Response Tolerated well   Patient received supine in bed eager to participate. Ambulated mod I in hallway with steady gait. Returned to room without complaint or incident. Was left lying supine in bed with all needs met, call bell in reach.    07/29/2021 12:11 PM  Ryan [name], [place], [place]  TUYWS:[phone] Office: [phone]

## 2021-07-29 NOTE — Discharge Summary (Signed)
Physician Discharge Summary   Patient: Ryan [name] MRN: [id number] DOB: May 22, 1930  Admit date:     07/27/2021  Discharge date: 07/29/21  Discharge Physician: Raiford Noble, DO   PCP: Dorothyann Peng, NP   Recommendations at discharge:   Follow up with PCP within 1-2 weeks and repeat CBC, CMP, mag, Phos within 1 week Follow-up with ophthalmology Dr. Eulas Post within 1 week and continue valacyclovir for 4 days and then continue erythromycin for the eye Follow-up with ID in outpatient setting if necessary Follow-up with cardiology outpatient setting  Discharge Diagnoses: Principal Problem:   Herpes zoster ophthalmicus Active Problems:   Coronary artery disease of native artery of native heart with stable angina pectoris (HCC)   PAF (paroxysmal atrial fibrillation) (HCC)   Chronic combined systolic (congestive) and diastolic (congestive) heart failure ([place])   Hypertension   Presence of permanent cardiac pacemaker   HLD (hyperlipidemia)   Periorbital cellulitis of left eye   CKD (chronic kidney disease) stage 4, GFR 15-29 ml/min (HCC)   Thrombocytopenia (HCC)   Hyperglycemia  Resolved Problems:   * No resolved hospital problems. [facility] Course: The patient is an 88 year old elderly Caucasian male with a past medical history significant for but not limited to PAF on anticoagulation, history of tachybradycardia syndrome, history of AVR, history of CAD, history of chronic combined systolic and diastolic CHF, hypertension, hyperlipidemia, asthma, hypothyroidism, chronic kidney disease stage IIIb, history of endocarditis as well as other comorbidities who presented to the emergency room with worsening left eye pain and drainage for last 2 weeks.  Symptoms started about 2 weeks ago when he started having some dry eyes and went to his ophthalmologist a few days ago for redness and irritation.  At that time he was given doxycycline 100 mg p.o. twice daily and azithromycin eyedrops.  Given  his continued worsening he saw his PCP received antibiotics and steroids for presumed facial soft tissue infection.  He progressively had worsening eye pain and drainage for last 2 weeks and several days ago he started having pustules on his forehead and scalp when he woke up this a.m.  Yesterday at his PCPs office he was given ceftriaxone IM and Depo-Medrol today continues to have symptoms and presented.  His CBC was normal CMP was stable with a GFR and his creatinine of 1.8 given his history of CKD stage IIIb.  A CT of the orbits was done and showed  "Left preseptal soft tissue swelling. No acute intraorbital finding on either side."  The EDP spoke with the hospitalist team and they recommended transfer to [name] given the ophthalmology request.  He was given pain management, prednisolone and started on acyclovir.  ID was also consulted who recommended acyclovir and vancomycin.  I have notified Dr. Posey Pronto of ophthalmology that the patient has arrived to [facility].   **Interim History Patient's eye looks a lot better and is lesions are clearing up.  Denies any nausea or vomiting.  Patient started hallucinating a little bit overnight given his prednisone and lack of sleep but is doing better today.  He is awake and alert.  Renal function slowly went up so we will need to continue monitor carefully.  Per ID continuing IV acyclovir we will add the doxycycline per ID recommendations.  Ophthalmology evaluated and recommending continuing acyclovir with no topical therapy for keratitis except for erythromycin for lubrication.  Patient is acyclovir was changed to valacyclovir 1000 mg p.o. daily by infectious diseases.  He will  be sent home on a prednisone taper given that he is improved and that his vesicles or crusting.  His vision has improved slightly and he is completed 30 days of IV acyclovir and ID recommended discharging on 7 days total of antivirals and he will be continued on erythromycin.  Renal  function improved slightly.  He has been deemed stable for discharge and was given Tylenol 3 for pain control given that he has adverse effects with narcotics.  Given that his cellulitis is improved they have stopped his doxycycline and have transitioned him back to his suppressive dose of amoxicillin at discharge.  All questions were answered to the patient's family satisfaction and he has been deemed stable for discharge with outpatient follow-up with the specialist listed as above  Assessment and Plan: * Herpes zoster ophthalmicus- (present on admission) - This patient's presenting complaint and he is currently afebrile.  Has been seen in the last few weeks for this by his PCP and went to urgent care yesterday.  Received 3 days of erythromycin and yesterday received a dose of ceftriaxone IM and given steroids. -Given his worsening he presented to the [facility] ED and was transferred to [name] by ophthalmology request -Consulted Dr. Posey Pronto in Ophthalmology for  evaluation and recs of eye drops -His diseases has also been consulted and they are recommending acyclovir and we have also initiated IV vancomycin per their request -CT of the orbits done as above -Currently not septic on admission he is afebrile and has no white count -Further care per specialists and ophthalmology just recommends erythromycin topically to prevent scarring and recommends continue IV acyclovir; -Gentle IV fluid hydration with 50 MLS per hour given his history of combined systolic and diastolic CHF -Initiated on prednisolone 60 mg p.o. dail and will change to prednisone taper for discharge -Continued pain control was given IV morphine 4 mg x 2 and also fentanyl 50 mcg IV; will change to fentanyl 12.5 to 25 mg every 2 as needed -Given that patient has a pacer they are checking to see if an MRI is compatible or not but likely does not need an MRI given that he is improving -EDP exam done and he had dendrites on fluorescein  stain with eyelid vesicles and patient admits to having some gross vision loss when compared to the right. 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      Hyperglycemia -Blood sugars are likely elevated in the setting of steroid Demargination but now given that he has Prediabetes will need close PCP follow-up -We will place on sensitive NovoLog/scale insulin AC -Checked a hemoglobin A1c and it was 5.8  -Continue monitor blood sugars carefully and if necessary make further insulin adjustments -CBGs ranging from 99-173  Thrombocytopenia ([place]) -Patient's Platelet Count went from 157 -> 147 -Continue to Monitor for S/Sx of Bleeding; No overt bleeding Noted -Repeat CBC in the AM   CKD (chronic kidney disease) stage 4, GFR 15-29 ml/min (HCC) -Has a Documented Stage 4 CKD per wife and sees Dr. Gean Quint in the outpatient -Patient is BUN/creatinine is now 35/1.80 -> 40/1.89 and is improved to 36/1.70 -Avoid nephrotoxic medications, contrast dyes, hypotension and dehydration and  renally dose medications -Patient is going to be placed on IV vancomycin and IV acyclovir so we will start gentle IV fluid hydration with 50 mL/hr given that he has a history of chronic combined CHF -Patient was given additional 500 mL yesterday over 4 hours -Continue monitor and trend and repeat CMP within 1 week  Periorbital cellulitis of left eye - ee above.  His herpes zoster ophthalmicus resulting in vision impairment with secondary periorbital cellulitis -Ophthalmology has been consulted and he has been initiated on Acyclovir and was given 10 mg/kg/day of  Methylprednisolone and vancomycin had been ordered but ID recommends stopping the vancomycin and changing to p.o. Doxycycline and increasing the dose of Amoxicillin.  He has completed 3 days of antibiotics and his cellulitis appears to be resolved clinically so infectious disease has stopped the doxycycline and the amoxicillin. -ID had also been consulted for further evaluation recommendations and appreciate their recommendations -Check blood cultures x2 and showed No Growth to at 2 days and will need to follow-up with his PCP or ID for final blood culture readings  HLD (hyperlipidemia)- (present on admission) -Resume home Atorvastatin 40 mg p.o. nightly  Presence of permanent cardiac pacemaker- (present on admission) - Pacer in place and he has a history of a TAVR with TAVR Endocarditis x2 -Continue with suppressive amoxicillin 500 every 12 at discharge as the 500 3 times daily has not been stopped by ID  Hypertension- (present on admission) -Continue to monitor blood pressures per protocol continue home medications including amlodipine 5 mg p.o. daily, isosorbide mononitrate 30 mg p.o. daily, metoprolol succinate 100 g p.o. daily -Continue to monitor blood pressures per protocol -Last blood pressure reading was slightly elevated to 170/62   Chronic combined systolic (congestive) and diastolic (congestive) heart failure ([place])- (present on admission) -Continue monitor volume status carefully -Currently not decompensated -Resume home cardiac medications as above and continue Lasix 20 g p.o. daily as needed for fluid or edema but he is no longer taking this -Strict I's and O's and Daily Weights; Patient is + 2.512 liters since admission  -Continue monitor for signs and symptoms of volume overload and follow-up with cardiology outpatient setting   PAF (paroxysmal atrial fibrillation) ([place])- (present on admission) -Continue to monitor on telemetry -Continue with anticoagulation with apixaban  2.5 mg p.o. twice daily given his age and renal function -Continue with home Beta-Blocker with Metoprolol Succinate 100 mg p.o. daily  Coronary artery disease of native artery of native heart with stable angina pectoris ([place])- (present on admission) -Has also history of TAVR -Denies any chest pain -Continue home medications including Isosorbide Mononitrate 30 mg p.o. daily, Metoprolol Succinate 100 mg p.o. daily, Nitroglycerin 0.4 mg sublingually every 5 minutes as needed and also resume his home Atorvastatin 40 g p.o. daily -Follow-up with cardiology in outpatient setting   Pain control - [place] was reviewed. and patient was instructed, not to drive, operate heavy machinery, perform activities at heights, swimming or participation in water activities or provide baby-sitting services while on Pain, Sleep and Anxiety Medications; until their outpatient Physician has advised to do so again. Also recommended to not to take more than prescribed Pain, Sleep and Anxiety Medications.   Consultants: Infectious diseases, ophthalmology Procedures performed: None Disposition: Home Diet recommendation:  Cardiac and Carb modified diet  DISCHARGE MEDICATION: Allergies as of 07/29/2021       Reactions   Ciprofloxacin Rash   Hydrochlorothiazide Rash   Sulfa Antibiotics Anaphylaxis  Ace Inhibitors Cough   Losartan Cough   Carvedilol Cough   Pt states it "causes him too cough."   Lisinopril Cough   Pt reports worsening cough and "feeling wheezy."   Soy Allergy Nausea And Vomiting        Medication List     STOP taking these medications    acetaminophen 500 MG tablet Commonly known as: TYLENOL   doxycycline 100 MG tablet Commonly known as: ADOXA   furosemide 20 MG tablet Commonly known as: LASIX   predniSONE 20 MG tablet Commonly known as: DELTASONE Replaced by: predniSONE 10 MG (21) Tbpk tablet   vitamin B-12 1000 MCG  tablet Commonly known as: CYANOCOBALAMIN       TAKE these medications    acetaminophen-codeine 300-30 MG tablet Commonly known as: TYLENOL #3 Take 1 tablet by mouth every 8 (eight) hours as needed for moderate pain.   albuterol 108 (90 Base) MCG/ACT inhaler Commonly known as: VENTOLIN HFA Inhale 2 puffs into the lungs every 6 (six) hours as needed for wheezing or shortness of breath.   amLODipine 5 MG tablet Commonly known as: NORVASC TAKE 1 TABLET(5 MG) BY MOUTH DAILY What changed:  how much to take how to take this when to take this additional instructions   amoxicillin 500 MG tablet Commonly known as: AMOXIL TAKE 1 TABLET(500 MG) BY MOUTH TWICE DAILY What changed:  how much to take how to take this when to take this additional instructions   atorvastatin 40 MG tablet Commonly known as: LIPITOR TAKE 1 TABLET(40 MG) BY MOUTH DAILY AT 6 PM What changed:  how much to take how to take this when to take this additional instructions   diphenhydramine-acetaminophen 25-500 MG Tabs tablet Commonly known as: TYLENOL PM Take 1 tablet by mouth at bedtime as needed (sleep).   Eliquis 2.5 MG Tabs tablet Generic drug: apixaban TAKE 1 TABLET(2.5 MG) BY MOUTH TWICE DAILY What changed: See the new instructions.   erythromycin ophthalmic ointment 1 application in the morning, at noon, and at bedtime. Apply to both eye lids for 7 days   isosorbide mononitrate 30 MG 24 hr tablet Commonly known as: IMDUR Take 1 tablet (30 mg total) by mouth daily.   latanoprost 0.005 % ophthalmic solution Commonly known as: XALATAN Place 1 drop into both eyes at bedtime.   metoprolol succinate 100 MG 24 hr tablet Commonly known as: TOPROL-XL Take 1 tablet (100 mg total) by mouth daily. Take with or immediately following a meal.   nitroGLYCERIN 0.4 MG SL tablet Commonly known as: NITROSTAT Place 1 tablet (0.4 mg total) under the tongue every 5 (five) minutes x 3 doses as needed for  chest pain.   ondansetron 4 MG tablet Commonly known as: ZOFRAN Take 1 tablet (4 mg total) by mouth every 6 (six) hours as needed for nausea.   polyethylene glycol powder 17 GM/SCOOP powder Commonly known as: GLYCOLAX/MIRALAX Take 17 g by mouth daily as needed for mild constipation.   predniSONE 10 MG (21) Tbpk tablet Commonly known as: STERAPRED UNI-PAK 21 TAB Take 6 tablets on day 1; take 5 tablets on day 2; take 4 tablets on day 3; take 3 tablets on day 4; take 2 tablets on day 5; take 1 tablet on day 6 and then stop on the Day 7 Replaces: predniSONE 20 MG tablet   Rhopressa 0.02 % Soln Generic drug: Netarsudil Dimesylate Place 1 drop into both eyes at bedtime.   valACYclovir 500 MG tablet Commonly known  as: VALTREX Take 2 tablets (1,000 mg total) by mouth daily for 4 days. Start taking on: July 30, 2021        Follow-up Information     Nafziger, Tommi Rumps, NP. Call.   Specialty: Family Medicine Why: Follow up within 1-2 weeks Contact information: [place] [address] [phone]         Sanda Klein, MD .   Specialty: Cardiology Contact information: [address] [place] [address] [phone]         Lonia Skinner, MD Follow up.   Specialty: Ophthalmology Why: Follow up within 1-2 weeks Contact information: [place] [address] [phone]         Thayer Headings, MD Follow up.   Specialty: Infectious Diseases Why: Follow up within 1-2 weeks Contact information: [address] [address] [phone]                 Discharge Exam: Danley Danker Weights   07/27/21 2244 07/28/21 0456  Weight: 59 kg 58.4 kg   Vitals:   07/29/21 0400 07/29/21 0949  BP: (!) 119/108 (!) 170/62  Pulse: 62 64  Resp: 18   Temp: 98 F (36.7 C)   SpO2: 92%    Constitutional: WN/WD elderly Caucasian male currently in no acute distress appears calm Eyes: I  have vesicles or crusting over and his cellulitis and erythema is improved.  Has a little lid lag on the left eye Respiratory: Mildly diminished to auscultation bilaterally, no wheezing, rales, rhonchi or crackles. Normal respiratory effort and patient is not tachypenic. No accessory muscle use.  Unlabored breathing Cardiovascular: RRR, no murmurs / rubs / gallops. S1 and S2 auscultated.  No appreciable extremity edema noted Abdomen: Soft, non-tender, non-distended. Bowel sounds positive.  GU: Deferred. Musculoskeletal: No clubbing / cyanosis of digits/nails. No joint deformity upper and lower extremities appreciated  Condition at discharge: stable  The results of significant diagnostics from this hospitalization (including imaging, microbiology, ancillary and laboratory) are listed below for reference.   Imaging Studies: CUP PACEART REMOTE DEVICE CHECK  Result Date: 07/09/2021 Scheduled remote reviewed. Normal device function.  Known PAF, +OAC, burden <1%, 13 AHR's suggestive of AF 5 VHR's, likely AF with RVR vs SVT Next remote 91 days. LA  CT Orbits W Contrast  Result Date: 07/27/2021 CLINICAL DATA:  Monocular vision loss. Herpes zoster with red swollen left eye EXAM: CT ORBITS WITH CONTRAST TECHNIQUE: Multidetector CT images was performed according to the standard protocol following intravenous contrast administration. RADIATION DOSE REDUCTION: This exam was performed according to the departmental dose-optimization program which includes automated exposure control, adjustment of the mA and/or kV according to patient size and/or use of iterative reconstruction technique. CONTRAST:  6mL OMNIPAQUE IOHEXOL 300 MG/ML  SOLN COMPARISON:  No pertinent prior exam. FINDINGS: Orbits: Preseptal soft tissue swelling on the left. Bilateral cataract resection and senile calcifications of the globes. No evidence of intra-ocular collection or scleral thickening. Unremarkable optic nerve sheath complexes,  lacrimal glands, muscles, ophthalmic veins, and orbital fat. Visible paranasal sinuses: Clear Soft tissues: Left preseptal soft tissue swelling is noted above. Osseous: Negative for fracture or lesion. Limited intracranial: Negative IMPRESSION: Left preseptal soft tissue swelling. No acute intraorbital finding on either side. Electronically Signed   By: Jorje Guild M.D.   On: 07/27/2021 11:07    Microbiology: Results for orders placed or performed during the hospital encounter of 07/27/21  Resp Panel  by RT-PCR (Flu A&B, Covid) Nasopharyngeal Swab     Status: None   Collection Time: 07/27/21 10:33 AM   Specimen: Nasopharyngeal Swab; Nasopharyngeal(NP) swabs in vial transport medium  Result Value Ref Range Status   SARS Coronavirus 2 by RT PCR NEGATIVE NEGATIVE Final    Comment: (NOTE) SARS-CoV-2 target nucleic acids are NOT DETECTED.  The SARS-CoV-2 RNA is generally detectable in upper respiratory specimens during the acute phase of infection. The lowest concentration of SARS-CoV-2 viral copies this assay can detect is 138 copies/mL. A negative result does not preclude SARS-Cov-2 infection and should not be used as the sole basis for treatment or other patient management decisions. A negative result may occur with  improper specimen collection/handling, submission of specimen other than nasopharyngeal swab, presence of viral mutation(s) within the areas targeted by this assay, and inadequate number of viral copies(<138 copies/mL). A negative result must be combined with clinical observations, patient history, and epidemiological information. The expected result is Negative.  Fact Sheet for Patients:  EntrepreneurPulse.com.au  Fact Sheet for Healthcare Providers:  IncredibleEmployment.be  This test is no t yet approved or cleared by the [place] FDA and  has been authorized for detection and/or diagnosis of SARS-CoV-2 by FDA under an  Emergency Use Authorization (EUA). This EUA will remain  in effect (meaning this test can be used) for the duration of the COVID-19 declaration under Section 564(b)(1) of the Act, 21 U.S.C.section 360bbb-3(b)(1), unless the authorization is terminated  or revoked sooner.       Influenza A by PCR NEGATIVE NEGATIVE Final   Influenza B by PCR NEGATIVE NEGATIVE Final    Comment: (NOTE) The Xpert Xpress SARS-CoV-2/FLU/RSV plus assay is intended as an aid in the diagnosis of influenza from Nasopharyngeal swab specimens and should not be used as a sole basis for treatment. Nasal washings and aspirates are unacceptable for Xpert Xpress SARS-CoV-2/FLU/RSV testing.  Fact Sheet for Patients: EntrepreneurPulse.com.au  Fact Sheet for Healthcare Providers: IncredibleEmployment.be  This test is not yet approved or cleared by the [place] FDA and has been authorized for detection and/or diagnosis of SARS-CoV-2 by FDA under an Emergency Use Authorization (EUA). This EUA will remain in effect (meaning this test can be used) for the duration of the COVID-19 declaration under Section 564(b)(1) of the Act, 21 U.S.C. section 360bbb-3(b)(1), unless the authorization is terminated or revoked.  Performed at [facility], [address], [place], [place] [address]   Culture, blood (routine x 2)     Status: None (Preliminary result)   Collection Time: 07/27/21  4:10 PM   Specimen: BLOOD LEFT HAND  Result Value Ref Range Status   Specimen Description BLOOD LEFT HAND  Final   Special Requests   Final    BOTTLES DRAWN AEROBIC AND ANAEROBIC Blood Culture adequate volume   Culture   Final    NO GROWTH 2 DAYS Performed at [facility], [place] [address] [place], [place] [address]    Report Status PENDING  Incomplete  Culture, blood (routine x 2)     Status: None (Preliminary result)   Collection Time: 07/27/21  4:15 PM   Specimen: BLOOD  RIGHT HAND  Result Value Ref Range Status   Specimen Description BLOOD RIGHT HAND  Final   Special Requests   Final    BOTTLES DRAWN AEROBIC AND ANAEROBIC Blood Culture adequate volume   Culture   Final    NO GROWTH 2 DAYS Performed at [facility], [address] [place], [place] [address]    Report Status PENDING  Incomplete    Labs: CBC: Recent Labs  Lab 07/27/21 0950 07/28/21 0239 07/29/21 0322  WBC 5.9 6.5 9.1  NEUTROABS 4.2  --  7.5  HGB 15.6 14.3 14.3  HCT 48.1 41.5 42.7  MCV 95.4 93.5 95.3  PLT 157 147* 654*   Basic Metabolic Panel: Recent Labs  Lab 07/27/21 0950 07/28/21 0239 07/29/21 0322  NA 138 137 138  K 4.3 4.5 4.4  CL 102 103 107  CO2 27 24 22   GLUCOSE 116* 130* 102*  BUN 35* 40* 36*  CREATININE 1.80* 1.89* 1.70*  CALCIUM 9.8 9.3 9.0  MG  --   --  1.9  PHOS  --   --  3.8   Liver Function Tests: Recent Labs  Lab 07/27/21 0950 07/28/21 0239 07/29/21 0322  AST 24 27 32  ALT 14 14 16   ALKPHOS 73 60 54  BILITOT 0.5 0.5 0.5  PROT 6.3* 5.6* 5.3*  ALBUMIN 4.2 3.3* 3.1*   CBG: Recent Labs  Lab 07/28/21 1306 07/28/21 1600 07/28/21 2002 07/29/21 0738 07/29/21 1232  GLUCAP 160* 123* 142* 103* 99    Discharge time spent: greater than 30 minutes.  Signed: Raiford Noble, DO [facility] 07/29/2021

## 2021-07-29 NOTE — Progress Notes (Signed)
[facility]   Reason for visit: Follow up on herpes zoster ophthalmicus  Interval History: improved vision, decreased swelling of eyelid; WBC wnl, no fever. Day 3 acyclovir  Physical Exam: Constitutional:  Vitals:   07/29/21 0400 07/29/21 0949  BP: (!) 119/108 (!) 170/62  Pulse: 62 64  Resp: 18   Temp: 98 F (36.7 C)   SpO2: 92%    patient appears in NAD Eyes: no erythema, no warmth; + ptosis Respiratory: Normal respiratory effort  Review of Systems: Constitutional: negative for fevers and chills  Lab Results  Component Value Date   WBC 9.1 07/29/2021   HGB 14.3 07/29/2021   HCT 42.7 07/29/2021   MCV 95.3 07/29/2021   PLT 137 (L) 07/29/2021    Lab Results  Component Value Date   CREATININE 1.70 (H) 07/29/2021   BUN 36 (H) 07/29/2021   NA 138 07/29/2021   K 4.4 07/29/2021   CL 107 07/29/2021   CO2 22 07/29/2021    Lab Results  Component Value Date   ALT 16 07/29/2021   AST 32 07/29/2021   ALKPHOS 54 07/29/2021     Microbiology: Recent Results (from the past 240 hour(s))  Resp Panel by RT-PCR (Flu A&B, Covid) Nasopharyngeal Swab     Status: None   Collection Time: 07/27/21 10:33 AM   Specimen: Nasopharyngeal Swab; Nasopharyngeal(NP) swabs in vial transport medium  Result Value Ref Range Status   SARS Coronavirus 2 by RT PCR NEGATIVE NEGATIVE Final    Comment: (NOTE) SARS-CoV-2 target nucleic acids are NOT DETECTED.  The SARS-CoV-2 RNA is generally detectable in upper respiratory specimens during the acute phase of infection. The lowest concentration of SARS-CoV-2 viral copies this assay can detect is 138 copies/mL. A negative result does not preclude SARS-Cov-2 infection and should not be used as the sole basis for treatment or other patient management decisions. A negative result may occur with  improper specimen collection/handling, submission of specimen other than nasopharyngeal swab, presence of viral mutation(s)  within the areas targeted by this assay, and inadequate number of viral copies(<138 copies/mL). A negative result must be combined with clinical observations, patient history, and epidemiological information. The expected result is Negative.  Fact Sheet for Patients:  EntrepreneurPulse.com.au  Fact Sheet for Healthcare Providers:  IncredibleEmployment.be  This test is no t yet approved or cleared by the [place] FDA and  has been authorized for detection and/or diagnosis of SARS-CoV-2 by FDA under an Emergency Use Authorization (EUA). This EUA will remain  in effect (meaning this test can be used) for the duration of the COVID-19 declaration under Section 564(b)(1) of the Act, 21 U.S.C.section 360bbb-3(b)(1), unless the authorization is terminated  or revoked sooner.       Influenza A by PCR NEGATIVE NEGATIVE Final   Influenza B by PCR NEGATIVE NEGATIVE Final    Comment: (NOTE) The Xpert Xpress SARS-CoV-2/FLU/RSV plus assay is intended as an aid in the diagnosis of influenza from Nasopharyngeal swab specimens and should not be used as a sole basis for treatment. Nasal washings and aspirates are unacceptable for Xpert Xpress SARS-CoV-2/FLU/RSV testing.  Fact Sheet for Patients: EntrepreneurPulse.com.au  Fact Sheet for Healthcare Providers: IncredibleEmployment.be  This test is not yet approved or cleared by the [place] FDA and has been authorized for detection and/or diagnosis of SARS-CoV-2 by FDA under an Emergency Use Authorization (EUA). This EUA will remain in effect (meaning this test can be used) for the duration of the COVID-19  declaration under Section 564(b)(1) of the Act, 21 U.S.C. section 360bbb-3(b)(1), unless the authorization is terminated or revoked.  Performed at [facility], [address], [place], [place] [address]   Culture, blood (routine x 2)      Status: None (Preliminary result)   Collection Time: 07/27/21  4:10 PM   Specimen: BLOOD LEFT HAND  Result Value Ref Range Status   Specimen Description BLOOD LEFT HAND  Final   Special Requests   Final    BOTTLES DRAWN AEROBIC AND ANAEROBIC Blood Culture adequate volume   Culture   Final    NO GROWTH 2 DAYS Performed at [facility], [place] [address] [place], [place] [address]    Report Status PENDING  Incomplete  Culture, blood (routine x 2)     Status: None (Preliminary result)   Collection Time: 07/27/21  4:15 PM   Specimen: BLOOD RIGHT HAND  Result Value Ref Range Status   Specimen Description BLOOD RIGHT HAND  Final   Special Requests   Final    BOTTLES DRAWN AEROBIC AND ANAEROBIC Blood Culture adequate volume   Culture   Final    NO GROWTH 2 DAYS Performed at [facility], [place] [address], [place], [place] [address]    Report Status PENDING  Incomplete    Impression/Plan:  1. 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  Will transition to oral valacyclovir 1 gram daily to complete 7 days total. He should follow up with ophthalmology  2.  Cellulitis - appears resolved clinically.  Will stop doxycycline  3.  H/o endocarditis - he will remain on suppressive amoxicillin at discharge.

## 2021-07-30 ENCOUNTER — Telehealth: Payer: Self-pay

## 2021-07-30 NOTE — Telephone Encounter (Signed)
Transition Care Management Follow-up Telephone Call Date of discharge and from where: [facility] 07[facility]-21 Dx: Herpes zoster ophthalmicus/ pre-diabetes How have you been since you were released from the hospital? Doing better  Any questions or concerns? No  Items Reviewed: Did the pt receive and understand the discharge instructions provided? Yes  Medications obtained and verified? Yes  Other? No  Any new allergies since your discharge? No  Dietary orders reviewed? Yes Do you have support at home? Yes   Home Care and Equipment/Supplies: Were home health services ordered? no If so, what is the name of the agency? na  Has the agency set up a time to come to the patient's home? no Were any new equipment or medical supplies ordered?  No What is the name of the medical supply agency? na Were you able to get the supplies/equipment? not applicable Do you have any questions related to the use of the equipment or supplies? No  Functional Questionnaire: (I = Independent and D = Dependent) ADLs: I  Bathing/Dressing- I  Meal Prep- I  Eating- I  Maintaining continence- I  Transferring/Ambulation- I  Managing Meds- D  Follow up appointments reviewed:  PCP Hospital f/u appt confirmed? Yes  Scheduled to see Ryan Peng, NP on 08-02-21 @ [facility] f/u appt confirmed? No  . Are transportation arrangements needed? No  If their condition worsens, is the pt aware to call PCP or go to the Emergency Dept.? Yes Was the patient provided with contact information for the PCP's office or ED? Yes Was to pt encouraged to call back with questions or concerns? Yes

## 2021-08-01 LAB — CULTURE, BLOOD (ROUTINE X 2)
Culture: NO GROWTH
Culture: NO GROWTH
Special Requests: ADEQUATE
Special Requests: ADEQUATE

## 2021-08-01 LAB — WOUND CULTURE
MICRO NUMBER:: 13064855
SPECIMEN QUALITY:: ADEQUATE

## 2021-08-01 LAB — HOUSE ACCOUNT TRACKING

## 2021-08-02 ENCOUNTER — Ambulatory Visit (INDEPENDENT_AMBULATORY_CARE_PROVIDER_SITE_OTHER): Payer: Medicare Other | Admitting: Adult Health

## 2021-08-02 ENCOUNTER — Telehealth: Payer: Medicare Other | Admitting: Adult Health

## 2021-08-02 ENCOUNTER — Encounter: Payer: Self-pay | Admitting: Adult Health

## 2021-08-02 VITALS — BP 180/70 | HR 70 | Temp 97.8°F | Ht 67.0 in

## 2021-08-02 DIAGNOSIS — R739 Hyperglycemia, unspecified: Secondary | ICD-10-CM

## 2021-08-02 DIAGNOSIS — I480 Paroxysmal atrial fibrillation: Secondary | ICD-10-CM | POA: Diagnosis not present

## 2021-08-02 DIAGNOSIS — I2510 Atherosclerotic heart disease of native coronary artery without angina pectoris: Secondary | ICD-10-CM

## 2021-08-02 DIAGNOSIS — I5042 Chronic combined systolic (congestive) and diastolic (congestive) heart failure: Secondary | ICD-10-CM

## 2021-08-02 DIAGNOSIS — L03213 Periorbital cellulitis: Secondary | ICD-10-CM

## 2021-08-02 DIAGNOSIS — N184 Chronic kidney disease, stage 4 (severe): Secondary | ICD-10-CM

## 2021-08-02 DIAGNOSIS — D696 Thrombocytopenia, unspecified: Secondary | ICD-10-CM

## 2021-08-02 DIAGNOSIS — B0230 Zoster ocular disease, unspecified: Secondary | ICD-10-CM

## 2021-08-02 DIAGNOSIS — E782 Mixed hyperlipidemia: Secondary | ICD-10-CM

## 2021-08-02 DIAGNOSIS — Z950 Presence of cardiac pacemaker: Secondary | ICD-10-CM

## 2021-08-02 DIAGNOSIS — I10 Essential (primary) hypertension: Secondary | ICD-10-CM

## 2021-08-02 LAB — COMPREHENSIVE METABOLIC PANEL
ALT: 22 U/L (ref 0–53)
AST: 27 U/L (ref 0–37)
Albumin: 4 g/dL (ref 3.5–5.2)
Alkaline Phosphatase: 64 U/L (ref 39–117)
BUN: 51 mg/dL — ABNORMAL HIGH (ref 6–23)
CO2: 27 mEq/L (ref 19–32)
Calcium: 9.5 mg/dL (ref 8.4–10.5)
Chloride: 101 mEq/L (ref 96–112)
Creatinine, Ser: 1.78 mg/dL — ABNORMAL HIGH (ref 0.40–1.50)
GFR: 33.48 mL/min — ABNORMAL LOW (ref >60.00)
Glucose, Bld: 115 mg/dL — ABNORMAL HIGH (ref 70–99)
Potassium: 4.7 mEq/L (ref 3.5–5.1)
Sodium: 136 mEq/L (ref 135–145)
Total Bilirubin: 0.5 mg/dL (ref 0.2–1.2)
Total Protein: 6.5 g/dL (ref 6.0–8.3)

## 2021-08-02 LAB — PHOSPHORUS: Phosphorus: 3.3 mg/dL (ref 2.3–4.6)

## 2021-08-02 LAB — CBC WITH DIFFERENTIAL/PLATELET
Basophils Absolute: 0 10*3/uL (ref 0.0–0.1)
Basophils Relative: 0.2 % (ref 0.0–3.0)
Eosinophils Absolute: 0 10*3/uL (ref 0.0–0.7)
Eosinophils Relative: 0 % (ref 0.0–5.0)
HCT: 47.5 % (ref 39.0–52.0)
Hemoglobin: 15.8 g/dL (ref 13.0–17.0)
Lymphocytes Relative: 8.6 % — ABNORMAL LOW (ref 12.0–46.0)
Lymphs Abs: 0.7 10*3/uL (ref 0.7–4.0)
MCHC: 33.2 g/dL (ref 30.0–36.0)
MCV: 95.1 fl (ref 78.0–100.0)
Monocytes Absolute: 0.3 10*3/uL (ref 0.1–1.0)
Monocytes Relative: 4.1 % (ref 3.0–12.0)
Neutro Abs: 6.9 10*3/uL (ref 1.4–7.7)
Neutrophils Relative %: 87.1 % — ABNORMAL HIGH (ref 43.0–77.0)
Platelets: 160 10*3/uL (ref 150.0–400.0)
RBC: 4.99 Mil/uL (ref 4.22–5.81)
RDW: 13.4 % (ref 11.5–15.5)
WBC: 8 10*3/uL (ref 4.0–10.5)

## 2021-08-02 LAB — MAGNESIUM: Magnesium: 2.3 mg/dL (ref 1.5–2.5)

## 2021-08-02 MED ORDER — ACETAMINOPHEN-CODEINE #3 300-30 MG PO TABS: 1.0000 | ORAL_TABLET | Freq: Three times a day (TID) | ORAL | 0 refills | Status: AC | PRN

## 2021-08-02 NOTE — Progress Notes (Signed)
Subjective:    Patient ID: Ryan [name], male    DOB: 23-Nov-1929, 88 y.o.   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), Anemia (12/2017), Arthritis, Asthma, Bacterial endocarditis, Carotid artery obstruction, Chronic combined systolic (congestive) and diastolic (congestive) heart failure (HCC) (03/18/2016), Chronic lower back pain, CKD (chronic kidney disease) stage 4, GFR 15-29 ml/min (HCC) (12/2017), Claudication in peripheral vascular disease ([place]) (01/01/2018), Coronary arteriosclerosis in native artery, Decreased diffusion capacity (10/23/2017), Diverticulitis of colon, DJD (degenerative joint disease), Elevated TSH (06/30/2017), GERD (gastroesophageal reflux disease), History of hiatal hernia, HLD (hyperlipidemia), Hypertension, Hypertensive heart disease with heart failure ([place]), Intractable back pain (11/07/2017), OSA (obstructive sleep apnea), PAF (paroxysmal atrial fibrillation) ([place]), Paroxysmal supraventricular tachycardia ([place]), Presence of permanent cardiac pacemaker, Primary malignant neoplasm of bladder ([place]), Prostate cancer ([place]), 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  He presents to the office today for TCM visit   Admit Date 07/27/2021 Discharge Date  07/29/2021  He presented to the emergency room with worsening left eye pain and drainage over the previous 2 weeks.  His symptoms started about 2 weeks prior when he started having some dry eyes and went to his ophthalmologist a few days ago for redness and irritation.  At that time he was given doxycycline 100 mg p.o. twice daily and azithromycin eyedrops.  His symptoms continued to worsen and he saw myself who gave him a IM Rocephin injection and IM steroid  injection for presumed facial soft tissue infection.  He progressively had worsening eye pain and drainage and started to have pustules on his forehead and scalp when he woke up in the morning that he presented to the ER.  CT scan of the orbits was done and showed "left preseptal soft tissue swelling.  No acute intraorbital finding on either side."  He was admitted to [facility] was given pain management, prednisone, and started on acyclovir.  ID was consulted who recommended acyclovir and vancomycin.  Hospital Course   Herpes zoster ophalmicus  -His worsening symptoms he presented to the [facility] ED and was transferred to [name] by ophthalmology request -Insulted Dr. Posey Pronto and ophthalmology for evaluation and recommendations of eyedrops -CT of the orbits done as above -Currently not septic on admission he is afebrile with no white count -Recommended erythromycin topically to prevent scarring and continue IV acyclovir -Was initiated on prednisone 60 mg p.o. daily and will change to prednisone taper for discharge. 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  His A1c was 5.8 -During admission CBGs ranging from 99-173  Thrombocytopenia  -Patient's platelet count went  from  157-->147---137 on discharge   CKD stage IV, GFR 15-29 -He does see nephrology as outpatient -Avoid nephrotoxic medications, contrast dyes, hypotension, and dehydration -Admission his BUN/creatinine was 40/1.89, on discharge 35/1.80 Repeat BMP at follow up   Preoribital Cellulitis  -Pulmonology consulted and had initiated cycle variant was given 10 mg/kg/day, methylprednisolone and vancomycin had been ordered by ID recommend stopping the vancomycin and changing to p.o. doxycycline and increasing the dose of amoxicillin.  After 3 days of antibiotics and cellulitis appears to be resolved clinically no ID has stopped the Doxy and amoxicillin -Blood cultures x2 had no growth  HLD -resume home atorvastatin 40 mg p.o. nightly  Presence of permanent cardiac pacemaker -Pacer in place and has a history of TAB R with [place] endocarditis x2 -Advised to continue with suppressive amoxicillin 500 mg every 12 hours at discharge  Hypertension  -Continue to monitor blood pressure per protocol, continue home medications including amlodipine 5 mg p.o. daily, isosorbide 30 mg p.o. daily, metoprolol succinate 100 mg p.o. daily -Continue to monitor blood pressure per protocol  Chronic combined systolic and diastolic CHF -Recommended continuing to monitor volume status carefully -Currently not decompensated -Resumed home cardiac medications and continue Lasix 20 mg p.o. daily as needed for fluid or edema  Paroxysmal atrial fibrillation -Continue to monitor.  On anticoagulation with Eliquis 2.5 mg p.o. daily given age and renal function.  Continue with home beta-blocker with metoprolol succinate 100 mg p.o. daily  Coronary Artery Disease  -History of TAVR are.  No chest pain noted.  Advised to continue on home medications including isosorbide 30 mg daily, metoprolol succinate 100 mg daily, nitroglycerin 0.4 mg sublingual every 5 minutes as needed and to resume his home atorvastatin 40 mg daily.   Follow-up with cardiology as outpatient  -----------------------------------------------------------------------------------------------  Today he presents with his wonderful family - daughter and wife.  Overall he feels as though he is doing well.  Has been starting to regain his strength to some degree.  His appetite is good, was able to be out on his deck yesterday doing some walking but still feels pretty weak past his baseline.  He does continue to have intermittent postherpetic neuralgia especially in his left eye.  He was given short course of Tylenol 3 upon discharge and will take this occasionally when the pain becomes severe and feels as though it helps.  Does have some blurred vision in his left eye, is seeing shadows and lights and feels as though at times his vision is improving.  He is concerned that his vision will not completely resolve.  He has an appointment with his ophthalmologist early next week and has made an appointment with his cardiologist for March 16 and infectious disease on August 20, 2021.  His wife and daughter do report some visual hallucinations when coming back from the hospital but this has also improved.   He has finished his oral antivirals and will finish his prednisone taper in 2 days.   Review of Systems  Constitutional:  Positive for fatigue.  HENT: Negative.    Eyes:  Positive for pain and visual disturbance.  Respiratory: Negative.    Cardiovascular: Negative.   Gastrointestinal: Negative.   Genitourinary: Negative.   Musculoskeletal: Negative.   Skin:  Positive for rash and wound.  Neurological:  Positive for weakness.  Psychiatric/Behavioral: Negative.      Past Medical History:  Diagnosis Date   Age-related macular degeneration    Allergic rhinitis    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  Anemia 12/2017   normocytic.  iron deficient, ferritin 17 04/2018   Arthritis    "hands, lower back" (04/30/2016)   Asthma    Bacterial  endocarditis    Carotid artery obstruction    a. s/p bilat CEA.   Chronic combined systolic (congestive) and diastolic (congestive) heart failure (HCC) 03/18/2016   Chronic lower back pain    CKD (chronic kidney disease) stage 4, GFR 15-29 ml/min (HCC) 12/2017   Claudication in peripheral vascular disease ([place]) 01/01/2018   Claudication   Coronary arteriosclerosis in native artery    a. 2017 Cardiac catheterization demonstrated worsening CAD with 70% mid LCx, 80% OM1 and 80% mid RCA stenosis. b.  In 11/17, he underwent successful rotational atherectomy and DES to RCA.   Decreased diffusion capacity 10/23/2017   Diverticulitis of colon    DJD (degenerative joint disease)    Elevated TSH 06/30/2017   GERD (gastroesophageal reflux disease)    History of hiatal hernia    HLD (hyperlipidemia)    Hypertension    Hypertensive heart disease with heart failure (HCC)    Intractable back pain 11/07/2017   OSA (obstructive sleep apnea)    intolerant to CPAP   PAF (paroxysmal atrial fibrillation) (HCC)    Paroxysmal supraventricular tachycardia (HCC)    Presence of permanent cardiac pacemaker    Primary malignant neoplasm of bladder ([place])    Prostate cancer ([place])    Prosthetic valve endocarditis ([place]) 11/10/2017   enterococcal bacteremia   S/P AVR 12/02/2017   Syncope 10/23/2017   Tachy-brady syndrome ([place])    Thrombocytopenia ([place]) 11/07/2017   Urinary retention 11/07/2017    Social History   Socioeconomic History   Marital status: Married    Spouse name: Not on file   Number of children: Not on file   Years of education: Not on file   Highest education level: Not on file  Occupational History   Occupation: retired  Tobacco Use   Smoking status: Never   Smokeless tobacco: Never  Vaping Use   Vaping Use: Never used  Substance and Sexual Activity   Alcohol use: No    Alcohol/week: 0.0 standard drinks    Comment: 04/30/2016 "nothing in years"   Drug use: No   Sexual activity: Never   Other Topics Concern   Not on file  Social History Narrative   Retired    Three children - One in [place], One in [place], and one in [place].       Social Determinants of Health   Financial Resource Strain: Low Risk    Difficulty of Paying Living Expenses: Not hard at all  Food Insecurity: Not on file  Transportation Needs: No Transportation Needs   Lack of Transportation (Medical): No   Lack of Transportation (Non-Medical): No  Physical Activity: Inactive   Days of Exercise per Week: 0 days   Minutes of Exercise per Session: 0 min  Stress: No Stress Concern Present   Feeling of Stress : Not at all  Social Connections: Moderately Integrated   Frequency of Communication with Friends and Family: More than three times a week   Frequency of Social Gatherings with Friends and Family: More than three times a week   Attends Religious Services: 1 to 4 times per year   Active Member of [employer] or Organizations: No   Attends Archivist Meetings: Never   Marital Status: Married  Human resources officer Violence: Not on file    Past Surgical History:  Procedure Laterality Date  APPENDECTOMY     CARDIAC CATHETERIZATION N/A 01/17/2016   Procedure: Right/Left Heart Cath and Coronary Angiography;  Surgeon: Belva Crome, MD;  Location: [facility];  Service: Cardiovascular;  Laterality: N/A;   CARDIAC CATHETERIZATION N/A 04/30/2016   Procedure: Coronary/Graft Atherectomy;  Surgeon: Sherren Mocha, MD;  Location: [place];  Service: Cardiovascular;  Laterality: N/A;   CAROTID ENDARTERECTOMY Bilateral    CATARACT EXTRACTION W/ INTRAOCULAR LENS  IMPLANT, BILATERAL Bilateral    COLON SURGERY  1981   Meckles Diverticulum with volvulus   COLONOSCOPY WITH PROPOFOL N/A 05/01/2018   Procedure: COLONOSCOPY WITH PROPOFOL;  Surgeon: Rush Landmark Telford Nab., MD;  Location: [facility];  Service: Gastroenterology;  Laterality: N/A;   CORONARY ANGIOGRAPHY N/A 01/18/2018   Procedure:  CORONARY ANGIOGRAPHY;  Surgeon: Lorretta Harp, MD;  Location: [place] [facility];  Service: Cardiovascular;  Laterality: N/A;   ENTEROSCOPY N/A 04/30/2018   Procedure: ENTEROSCOPY;  Surgeon: Rush Landmark Telford Nab., MD;  Location: [facility];  Service: Gastroenterology;  Laterality: N/A;   EP IMPLANTABLE DEVICE N/A 03/21/2016   Procedure: Pacemaker Implant;  Surgeon: Evans Lance, MD;  Location: [place] [facility];  Service: Cardiovascular;  Laterality: N/A;   EYE SURGERY     INGUINAL HERNIA REPAIR Right 2009   INSERT / REPLACE / REMOVE PACEMAKER     INSERTION PROSTATE RADIATION SEED     POLYPECTOMY  05/01/2018   Procedure: POLYPECTOMY;  Surgeon: Rush Landmark Telford Nab., MD;  Location: [place];  Service: Gastroenterology;;   TEE WITHOUT CARDIOVERSION N/A 06/10/2016   Procedure: TRANSESOPHAGEAL ECHOCARDIOGRAM (TEE);  Surgeon: Sherren Mocha, MD;  Location: [place];  Service: Open Heart Surgery;  Laterality: N/A;   TEE WITHOUT CARDIOVERSION N/A 11/10/2017   Procedure: TRANSESOPHAGEAL ECHOCARDIOGRAM (TEE);  Surgeon: Acie Fredrickson Wonda Cheng, MD;  Location: [facility];  Service: Cardiovascular;  Laterality: N/A;   TEE WITHOUT CARDIOVERSION N/A 12/15/2017   Procedure: TRANSESOPHAGEAL ECHOCARDIOGRAM (TEE);  Surgeon: Buford Dresser, MD;  Location: [facility] ENDOSCOPY;  Service: Cardiovascular;  Laterality: N/A;   TEE WITHOUT CARDIOVERSION N/A 03/30/2018   Procedure: TRANSESOPHAGEAL ECHOCARDIOGRAM (TEE);  Surgeon: Jerline Pain, MD;  [facility];  Service: Cardiovascular;  Laterality: N/A;   TONSILLECTOMY  ~ 1947   TRANSCATHETER AORTIC VALVE REPLACEMENT, TRANSFEMORAL N/A 06/10/2016   Procedure: TRANSCATHETER AORTIC VALVE REPLACEMENT, TRANSFEMORAL;  Surgeon: Sherren Mocha, MD;  Location: [place];  Service: Open Heart Surgery;  Laterality: N/A;    Family History  Problem Relation Age of Onset   Heart disease Mother    Heart disease Father     Allergies  Allergen Reactions    Ciprofloxacin Rash   Hydrochlorothiazide Rash   Sulfa Antibiotics Anaphylaxis   Ace Inhibitors Cough   Losartan Cough   Carvedilol Cough    Pt states it "causes him too cough."   Lisinopril Cough    Pt reports worsening cough and "feeling wheezy."   Soy Allergy Nausea And Vomiting    Current Outpatient Medications on File Prior to Visit  Medication Sig Dispense Refill   acetaminophen-codeine (TYLENOL #3) 300-30 MG tablet Take 1 tablet by mouth every 8 (eight) hours as needed for moderate pain. 12 tablet 0   albuterol (VENTOLIN HFA) 108 (90 Base) MCG/ACT inhaler Inhale 2 puffs into the lungs every 6 (six) hours as needed for wheezing or shortness of breath. 8 g 2   amLODipine (NORVASC) 5 MG tablet TAKE 1 TABLET(5 MG) BY MOUTH DAILY (Patient taking differently: Take 5 mg by mouth daily.)  90 tablet 1   amoxicillin (AMOXIL) 500 MG tablet TAKE 1 TABLET(500 MG) BY MOUTH TWICE DAILY (Patient taking differently: Take 500 mg by mouth 2 (two) times daily.) 60 tablet 11   atorvastatin (LIPITOR) 40 MG tablet TAKE 1 TABLET(40 MG) BY MOUTH DAILY AT 6 PM (Patient taking differently: Take 40 mg by mouth every evening.) 90 tablet 3   diphenhydramine-acetaminophen (TYLENOL PM) 25-500 MG TABS tablet Take 1 tablet by mouth at bedtime as needed (sleep).     ELIQUIS 2.5 MG TABS tablet TAKE 1 TABLET(2.5 MG) BY MOUTH TWICE DAILY (Patient taking differently: Take 2.5 mg by mouth 2 (two) times daily.) 180 tablet 1   erythromycin ophthalmic ointment 1 application in the morning, at noon, and at bedtime. Apply to both eye lids for 7 days     isosorbide mononitrate (IMDUR) 30 MG 24 hr tablet Take 1 tablet (30 mg total) by mouth daily. 90 tablet 3   latanoprost (XALATAN) 0.005 % ophthalmic solution Place 1 drop into both eyes at bedtime.     metoprolol succinate (TOPROL-XL) 100 MG 24 hr tablet Take 1 tablet (100 mg total) by mouth daily. Take with or immediately following a meal. 90 tablet 3   nitroGLYCERIN (NITROSTAT)  0.4 MG SL tablet Place 1 tablet (0.4 mg total) under the tongue every 5 (five) minutes x 3 doses as needed for chest pain. 25 tablet 3   ondansetron (ZOFRAN) 4 MG tablet Take 1 tablet (4 mg total) by mouth every 6 (six) hours as needed for nausea. 20 tablet 0   polyethylene glycol powder (GLYCOLAX/MIRALAX) 17 GM/SCOOP powder Take 17 g by mouth daily as needed for mild constipation. 238 g 0   predniSONE (STERAPRED UNI-PAK 21 TAB) 10 MG (21) TBPK tablet Take 6 tablets on day 1; take 5 tablets on day 2; take 4 tablets on day 3; take 3 tablets on day 4; take 2 tablets on day 5; take 1 tablet on day 6 and then stop on the Day 7 21 tablet 0   RHOPRESSA 0.02 % SOLN Place 1 drop into both eyes at bedtime.     valACYclovir (VALTREX) 500 MG tablet Take 2 tablets (1,000 mg total) by mouth daily for 4 days. 8 tablet 0   No current facility-administered medications on file prior to visit.    There were no vitals taken for this visit.      Objective:   Physical Exam Vitals and nursing note reviewed.  Constitutional:      Appearance: Normal appearance.  HENT:     Head:      Comments: Well-healing shingles rash noted on left eye and left forehead.  Most of the vesicles have scabbed over.  No signs of infection Eyes:     General:        Right eye: No discharge.        Left eye: Discharge present.    Extraocular Movements: Extraocular movements intact.     Conjunctiva/sclera: Conjunctivae normal.     Pupils: Pupils are equal, round, and reactive to light.     Comments: Continues to have erythema to his left orbit  Cardiovascular:     Rate and Rhythm: Normal rate and regular rhythm.     Pulses: Normal pulses.     Heart sounds: Normal heart sounds.  Pulmonary:     Effort: Pulmonary effort is normal.     Breath sounds: Normal breath sounds.  Musculoskeletal:        General: Normal range  of motion.  Skin:    General: Skin is warm and dry.     Capillary Refill: Capillary refill takes less than 2  seconds.     Findings: Lesion and rash present.  Neurological:     General: No focal deficit present.     Mental Status: He is alert and oriented to person, place, and time.  Psychiatric:        Mood and Affect: Mood normal.        Behavior: Behavior normal.        Thought Content: Thought content normal.        Judgment: Judgment normal.      Assessment & Plan:  1. Herpes zoster ophthalmicus -Follow-up with ophthalmology as directed as well as infectious disease.  He does appear to be healing well.  We will provide additional Tylenol 3 to take as needed. - acetaminophen-codeine (TYLENOL #3) 300-30 MG tablet; Take 1 tablet by mouth every 8 (eight) hours as needed for moderate pain.  Dispense: 30 tablet; Refill: 0  2. Presence of permanent cardiac pacemaker   3. Primary hypertension -Continues to be elevated in the office today.  Likely due to steroid use.  Family will monitor blood pressure at home I would expect early next week his blood pressure should return to normal. - CBC with Differential/Platelet; Future - Comprehensive metabolic panel; Future - Magnesium; Future - Phosphorus; Future - Phosphorus - Magnesium - Comprehensive metabolic panel - CBC with Differential/Platelet  4. Chronic combined systolic (congestive) and diastolic (congestive) heart failure ([place]) - Per cardiology  - CBC with Differential/Platelet; Future - Comprehensive metabolic panel; Future - Magnesium; Future - Phosphorus; Future - Phosphorus - Magnesium - Comprehensive metabolic panel - CBC with Differential/Platelet  5. PAF (paroxysmal atrial fibrillation) ([place]) - Per cardiology  - CBC with Differential/Platelet; Future - Comprehensive metabolic panel; Future - Magnesium; Future - Phosphorus; Future - Phosphorus - Magnesium - Comprehensive metabolic panel - CBC with Differential/Platelet  6. Coronary artery disease involving native coronary artery of native heart without angina  pectoris - Per cardiology  - CBC with Differential/Platelet; Future - Comprehensive metabolic panel; Future - Magnesium; Future - Phosphorus; Future - Phosphorus - Magnesium - Comprehensive metabolic panel - CBC with Differential/Platelet  7. Hyperglycemia -partially in the setting of oral steroids.  Was prediabetc.  Due to age likely nothing that we need to worry about at this time. - CBC with Differential/Platelet; Future - Comprehensive metabolic panel; Future - Magnesium; Future - Phosphorus; Future - Phosphorus - Magnesium - Comprehensive metabolic panel - CBC with Differential/Platelet  8. Thrombocytopenia ([place]) -Continue to monitor - CBC with Differential/Platelet; Future - Comprehensive metabolic panel; Future - Magnesium; Future - Phosphorus; Future - Phosphorus - Magnesium - Comprehensive metabolic panel - CBC with Differential/Platelet  9. CKD (chronic kidney disease) stage 4, GFR 15-29 ml/min (HCC) -Continue to monitor.  Follow-up with nephrology as directed - CBC with Differential/Platelet; Future - Comprehensive metabolic panel; Future - Magnesium; Future - Phosphorus; Future - Phosphorus - Magnesium - Comprehensive metabolic panel - CBC with Differential/Platelet  10. Periorbital cellulitis of left eye -Continue with prednisone until finished as well as eye ointment. - CBC with Differential/Platelet; Future - Comprehensive metabolic panel; Future - Magnesium; Future - Phosphorus; Future - Phosphorus - Magnesium - Comprehensive metabolic panel - CBC with Differential/Platelet  11. Mixed hyperlipidemia - continue statin   Dorothyann Peng, NP

## 2021-08-07 DIAGNOSIS — B0233 Zoster keratitis: Secondary | ICD-10-CM | POA: Diagnosis not present

## 2021-08-07 DIAGNOSIS — H401233 Low-tension glaucoma, bilateral, severe stage: Secondary | ICD-10-CM | POA: Diagnosis not present

## 2021-08-07 DIAGNOSIS — H47292 Other optic atrophy, left eye: Secondary | ICD-10-CM | POA: Diagnosis not present

## 2021-08-09 ENCOUNTER — Telehealth: Payer: Medicare Other | Admitting: Adult Health

## 2021-08-12 DIAGNOSIS — B0233 Zoster keratitis: Secondary | ICD-10-CM | POA: Diagnosis not present

## 2021-08-12 DIAGNOSIS — H47292 Other optic atrophy, left eye: Secondary | ICD-10-CM | POA: Diagnosis not present

## 2021-08-12 DIAGNOSIS — H401233 Low-tension glaucoma, bilateral, severe stage: Secondary | ICD-10-CM | POA: Diagnosis not present

## 2021-08-15 ENCOUNTER — Ambulatory Visit: Payer: Medicare Other | Admitting: Cardiovascular Disease

## 2021-08-16 ENCOUNTER — Other Ambulatory Visit: Payer: Self-pay | Admitting: Adult Health

## 2021-08-19 ENCOUNTER — Other Ambulatory Visit (INDEPENDENT_AMBULATORY_CARE_PROVIDER_SITE_OTHER): Payer: Medicare Other

## 2021-08-19 ENCOUNTER — Telehealth: Payer: Self-pay

## 2021-08-19 ENCOUNTER — Other Ambulatory Visit: Payer: Self-pay | Admitting: Adult Health

## 2021-08-19 DIAGNOSIS — R300 Dysuria: Secondary | ICD-10-CM

## 2021-08-19 DIAGNOSIS — R410 Disorientation, unspecified: Secondary | ICD-10-CM

## 2021-08-19 LAB — URINALYSIS
Bilirubin Urine: NEGATIVE
Hgb urine dipstick: NEGATIVE
Ketones, ur: NEGATIVE
Leukocytes,Ua: NEGATIVE
Nitrite: NEGATIVE
Specific Gravity, Urine: 1.02 (ref 1.000–1.030)
Total Protein, Urine: NEGATIVE
Urine Glucose: NEGATIVE
Urobilinogen, UA: 0.2 (ref 0.0–1.0)
pH: 5.5 (ref 5.0–8.0)

## 2021-08-19 MED ORDER — TRAZODONE HCL 50 MG PO TABS: 25.0000 mg | ORAL_TABLET | Freq: Every evening | ORAL | 3 refills | Status: DC | PRN

## 2021-08-19 MED ORDER — LORAZEPAM 0.5 MG PO TABS
0.2500 mg | ORAL_TABLET | Freq: Two times a day (BID) | ORAL | 2 refills | Status: DC | PRN
Start: 2021-08-19 — End: 2021-08-29

## 2021-08-19 NOTE — Telephone Encounter (Signed)
Spoke with patient's daughter [name] and scheduled a in person Palliative Consult for 08/22/21 @ 11:30 AM.  ? ?Consent obtained; updated Outlook/Netsmart/Team List and Epic.  ? ? ?

## 2021-08-20 ENCOUNTER — Inpatient Hospital Stay: Payer: Medicare Other | Admitting: Internal Medicine

## 2021-08-20 LAB — URINE CULTURE
MICRO NUMBER:: 13152619
Result:: NO GROWTH
SPECIMEN QUALITY:: ADEQUATE

## 2021-08-21 ENCOUNTER — Telehealth (INDEPENDENT_AMBULATORY_CARE_PROVIDER_SITE_OTHER): Payer: Medicare Other | Admitting: Adult Health

## 2021-08-21 ENCOUNTER — Encounter: Payer: Self-pay | Admitting: Adult Health

## 2021-08-21 VITALS — BP 140/78 | HR 62 | Ht 67.0 in | Wt 128.0 lb

## 2021-08-21 DIAGNOSIS — F419 Anxiety disorder, unspecified: Secondary | ICD-10-CM

## 2021-08-21 DIAGNOSIS — G4700 Insomnia, unspecified: Secondary | ICD-10-CM | POA: Diagnosis not present

## 2021-08-21 DIAGNOSIS — F22 Delusional disorders: Secondary | ICD-10-CM | POA: Diagnosis not present

## 2021-08-21 DIAGNOSIS — R443 Hallucinations, unspecified: Secondary | ICD-10-CM

## 2021-08-21 MED ORDER — RISPERIDONE 1 MG PO TABS: 1.0000 mg | ORAL_TABLET | Freq: Every day | ORAL | 0 refills | Status: DC

## 2021-08-21 NOTE — Progress Notes (Signed)
Virtual Visit via Video Note ? ?I connected with Ryan [name]  on 08/21/21 at 11:30 AM EDT by a video enabled telemedicine application and verified that I am speaking with the correct person using two identifiers. ? Location patient: home ?Location provider:work or home office ?Persons participating in the virtual visit: patient, provider, daughter and wife.  ? ?I discussed the limitations of evaluation and management by telemedicine and the availability of in person appointments. The patient expressed understanding and agreed to proceed. ? ? ?HPI: ?88 year old male recently diagnosed with herpes zoster ophthalmicus admitted at the end of February 2023 for this condition.  No recovering at home with left-sided vision loss.  Since coming home from the hospital he has been suffering from insomnia, and visual and auditory hallucinations/ delusions ( possibly from sleep deprivation).  He reports seeing solders with guns that are in the room trying to kill his daughter.  ? ?Earlier this week he was started on trazodone 5 mg nightly and Ativan 0.25 mg twice daily.  Over the last few days there has been some improvement in his sleep schedule as well as the hallucinations/delusions.  It seems as though when he gets more sleep that the delusions and hallucinations seem to improve.  His wife and daughter report that he is sleeping and blocks.  Last night he slept from 9 PM to 12 AM and then was up for a while and then slept in 45-minute blocks after that.  Unfortunately since starting trazodone he has been experiencing a headache when he wakes up.  This is slightly relieved with Tylenol but does not completely go away. ? ?He did have some anxiety prior to shingles outbreak but since he is lost his vision is seeing things that are not there and is having trouble sleeping his anxiety has increased.  Usually presents worse around 4 PM every evening. ? ?We did due ua and culture this week which was negative  ? ? ?ROS: See  pertinent positives and negatives per HPI. ? ?Past Medical History:  ?Diagnosis Date  ? Age-related macular degeneration   ? Allergic rhinitis   ? 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 Anemia 12/2017  ? normocytic.  iron deficient, ferritin 17 04/2018  ? Arthritis   ? "hands, lower back" (04/30/2016)  ? Asthma   ? Bacterial endocarditis   ? Carotid artery obstruction   ? a. s/p bilat CEA.  ? Chronic combined systolic (congestive) and diastolic (congestive) heart failure ([place]) 03/18/2016  ? Chronic lower back pain   ? CKD (chronic kidney disease) stage 4, GFR 15-29 ml/min (HCC) 12/2017  ? Claudication in peripheral vascular disease ([place]) 01/01/2018  ? Claudication  ? Coronary arteriosclerosis in native artery   ? a. 2017 Cardiac catheterization demonstrated worsening CAD with 70% mid LCx, 80% OM1 and 80% mid RCA stenosis. b.  In 11/17, he underwent successful rotational atherectomy and DES to RCA.  ? Decreased diffusion capacity 10/23/2017  ? Diverticulitis of colon   ? DJD (degenerative joint disease)   ? Elevated TSH 06/30/2017  ? GERD (gastroesophageal reflux disease)   ? History of hiatal hernia   ? HLD (hyperlipidemia)   ? Hypertension   ? Hypertensive heart disease with heart failure ([place])   ? Intractable back pain 11/07/2017  ? OSA (obstructive sleep apnea)   ? intolerant to CPAP  ? PAF (paroxysmal atrial fibrillation) ([place])   ? Paroxysmal supraventricular tachycardia ([place])   ? Presence of permanent cardiac pacemaker   ? Primary malignant  neoplasm of bladder ([place])   ? Prostate cancer ([place])   ? 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 enterococcal bacteremia  ? S/P AVR 12/02/2017  ? Syncope 10/23/2017  ? Tachy-brady syndrome ([place])   ? Thrombocytopenia ([place]) 11/07/2017  ? Urinary retention 11/07/2017  ? ? ?Past Surgical History:  ?Procedure Laterality Date  ? APPENDECTOMY    ? CARDIAC CATHETERIZATION N/A 01/17/2016  ? Procedure: Right/Left Heart Cath and Coronary Angiography;  Surgeon: Belva Crome, MD;   Location: [facility];  Service: Cardiovascular;  Laterality: N/A;  ? CARDIAC CATHETERIZATION N/A 04/30/2016  ? Procedure: Coronary/Graft Atherectomy;  Surgeon: Sherren Mocha, MD;  Location: [facility];  Service: Cardiovascular;  Laterality: N/A;  ? CAROTID ENDARTERECTOMY Bilateral   ? CATARACT EXTRACTION W/ INTRAOCULAR LENS  IMPLANT, BILATERAL Bilateral   ? COLON SURGERY  1981  ? Meckles Diverticulum with volvulus  ? COLONOSCOPY WITH PROPOFOL N/A 05/01/2018  ? Procedure: COLONOSCOPY WITH PROPOFOL;  Surgeon: Mansouraty, Telford Nab., MD;  Location: [facility];  Service: Gastroenterology;  Laterality: N/A;  ? CORONARY ANGIOGRAPHY N/A 01/18/2018  ? Procedure: CORONARY ANGIOGRAPHY;  Surgeon: Lorretta Harp, MD;  Location: [facility];  Service: Cardiovascular;  Laterality: N/A;  ? ENTEROSCOPY N/A 04/30/2018  ? Procedure: ENTEROSCOPY;  Surgeon: Rush Landmark Telford Nab., MD;  Location: [facility];  Service: Gastroenterology;  Laterality: N/A;  ? EP IMPLANTABLE DEVICE N/A 03/21/2016  ? Procedure: Pacemaker Implant;  Surgeon: Evans Lance, MD;  Location: [facility];  Service: Cardiovascular;  Laterality: N/A;  ? EYE SURGERY    ? INGUINAL HERNIA REPAIR Right 2009  ? INSERT / REPLACE / REMOVE PACEMAKER    ? INSERTION PROSTATE RADIATION SEED    ? POLYPECTOMY  05/01/2018  ? Procedure: POLYPECTOMY;  Surgeon: Rush Landmark Telford Nab., MD;  Location: [facility];  Service: Gastroenterology;;  ? TEE WITHOUT CARDIOVERSION N/A 06/10/2016  ? Procedure: TRANSESOPHAGEAL ECHOCARDIOGRAM (TEE);  Surgeon: Sherren Mocha, MD;  Location: [facility];  Service: Open Heart Surgery;  Laterality: N/A;  ? TEE WITHOUT CARDIOVERSION N/A 11/10/2017  ? Procedure: TRANSESOPHAGEAL ECHOCARDIOGRAM (TEE);  Surgeon: Acie Fredrickson Wonda Cheng, MD;  Location: [facility];  Service: Cardiovascular;  Laterality: N/A;  ? TEE WITHOUT CARDIOVERSION N/A 12/15/2017  ? Procedure: TRANSESOPHAGEAL ECHOCARDIOGRAM (TEE);  Surgeon: Buford Dresser, MD;  Location[facility];  Service: Cardiovascular;  Laterality: N/A;  ? TEE WITHOUT CARDIOVERSION N/A 03/30/2018  ? Procedure: TRANSESOPHAGEAL ECHOCARDIOGRAM (TEE);  Surgeon: Jerline Pain, MD;  [facility];  Service: Cardiovascular;  Laterality: N/A;  ? TONSILLECTOMY  ~ 1947  ? TRANSCATHETER AORTIC VALVE REPLACEMENT, TRANSFEMORAL N/A 06/10/2016  ? Procedure: TRANSCATHETER AORTIC VALVE REPLACEMENT, TRANSFEMORAL;  Surgeon: Sherren Mocha, MD;  Location: [facility];  Service: Open Heart Surgery;  Laterality: N/A;  ? ? ?Family History  ?Problem Relation Age of Onset  ? Heart disease Mother   ? Heart disease Father   ? ? ? ? ? ?Current Outpatient Medications:  ?  acetaminophen-codeine (TYLENOL #3) 300-30 MG tablet, Take 1 tablet by mouth every 8 (eight) hours as needed for moderate pain., Disp: 30 tablet, Rfl: 0 ?  albuterol (VENTOLIN HFA) 108 (90 Base) MCG/ACT inhaler, Inhale 2 puffs into the lungs every 6 (six) hours as needed for wheezing or shortness of breath., Disp: 8 g, Rfl: 2 ?  amLODipine (NORVASC) 5 MG tablet, TAKE 1 TABLET(5 MG) BY MOUTH DAILY (Patient taking differently: Take 5 mg by mouth daily.), Disp: 90 tablet, Rfl: 1 ?  amoxicillin (AMOXIL) 500 MG tablet, TAKE 1 TABLET(500 MG) BY MOUTH TWICE DAILY (Patient taking differently: Take 500 mg by mouth 2 (two) times daily.), Disp: 60 tablet, Rfl: 11 ?  atorvastatin (LIPITOR) 40 MG tablet, TAKE 1 TABLET(40 MG) BY MOUTH DAILY AT 6 PM (Patient taking differently: Take 40 mg by mouth every evening.), Disp: 90 tablet, Rfl: 3 ?  diphenhydramine-acetaminophen (TYLENOL PM) 25-500 MG TABS tablet, Take 1 tablet by mouth at bedtime as needed (sleep)., Disp: , Rfl:  ?  ELIQUIS 2.5 MG TABS tablet, TAKE 1 TABLET(2.5 MG) BY MOUTH TWICE DAILY (Patient taking differently: Take 2.5 mg by mouth 2 (two) times daily.), Disp: 180 tablet, Rfl: 1 ?  erythromycin ophthalmic ointment, 1 application in the morning, at noon, and at bedtime. Apply to both eye lids for  7 days, Disp: , Rfl:  ?  isosorbide mononitrate (IMDUR) 30 MG 24 hr tablet, Take 1 tablet (30 mg total) by mouth daily., Disp: 90 tablet, Rfl: 3 ?  latanoprost (XALATAN) 0.005 % ophthalmic solution, Place 1 drop into

## 2021-08-22 ENCOUNTER — Encounter: Payer: Self-pay | Admitting: Family Medicine

## 2021-08-22 ENCOUNTER — Other Ambulatory Visit: Payer: Self-pay

## 2021-08-22 ENCOUNTER — Telehealth: Payer: Self-pay | Admitting: Adult Health

## 2021-08-22 ENCOUNTER — Other Ambulatory Visit: Payer: Medicare Other | Admitting: Family Medicine

## 2021-08-22 VITALS — BP 136/78 | HR 72 | Temp 97.8°F | Resp 18

## 2021-08-22 DIAGNOSIS — F5104 Psychophysiologic insomnia: Secondary | ICD-10-CM

## 2021-08-22 DIAGNOSIS — B0222 Postherpetic trigeminal neuralgia: Secondary | ICD-10-CM

## 2021-08-22 DIAGNOSIS — Z515 Encounter for palliative care: Secondary | ICD-10-CM

## 2021-08-22 DIAGNOSIS — F09 Unspecified mental disorder due to known physiological condition: Secondary | ICD-10-CM

## 2021-08-22 DIAGNOSIS — H547 Unspecified visual loss: Secondary | ICD-10-CM

## 2021-08-22 DIAGNOSIS — R5381 Other malaise: Secondary | ICD-10-CM

## 2021-08-22 DIAGNOSIS — B0230 Zoster ocular disease, unspecified: Secondary | ICD-10-CM | POA: Diagnosis not present

## 2021-08-22 NOTE — Telephone Encounter (Signed)
Santiago Glad NP is calling and has some concerns about patient and would like cory to return her call ?

## 2021-08-22 NOTE — Progress Notes (Signed)
? ? ?Manufacturing engineer ?[facility] Palliative [facility] Consult Note ?Telephone: [phone]  ?Fax: [phone]  ? ?Date of encounter: 08/24/21 ?11:30 AM ?PATIENT NAME: [name] ?[place] [address]   ?[phone] (home)  ?DOB: Oct 23, 1929 ?MRN: [id number] ?PRIMARY CARE PROVIDER:    ?Dorothyann Peng, NP,  ?[place] ?[place] [address] ?[phone] ? ?REFERRING PROVIDER:   ?Dorothyann Peng, NP ?[address] ?[place],  [place] [address] ?[phone] ? ?RESPONSIBLE PARTY:    ?Contact Information   ? ? Name Relation Home Work Mobile  ? Petrov,Margaret Spouse [phone]  [phone]  ? [name] Daughter   [phone]  ? Danis,Henry Relative   [phone]  ? ?  ? ? ? ?I met face to face with patient and family in his home. Palliative Care was asked to follow this patient by consultation request of  Nafziger, Tommi Rumps, NP to address advance care planning and complex medical decision making. This is the initial visit.  ? ? ?      ASSESSMENT, SYMPTOM MANAGEMENT AND PLAN / RECOMMENDATIONS:  ? Herpes zoster opthalmicus/poor vision ?Continues on Prednisolone eye gtts as prescribed by Ophthalmologist. Has completed course of Valtrex. ?Poor vision is a result of both right eye glaucoma and post-viral inflammation from Herpes zoster with resulting alteration in depth perception increasing risk for falls. ?Home Health OT for adaptations for visual acuity change, alterations for increased independence with bathing and dressing.  Home health aide to assist with safe bathing/personal care. ?Recommend pt discuss with Ophthalmologist if he would benefit from long term low dose suppressive Valtrex therapy. ? ?2.  Psychophysiologic insomnia/post-traumatic organic psychosis ?Paradoxical reaction to Risperdal.  SOB with benzodiazepine. ?Haldol 2 mg po Q 8 hrs prn hallucination, agitation with one dose at HS. Schedule 1 dose around 2-3 pm if pt becomes more agitated at 4 pm. Can  titrate dose up if needed. ?Increase physical activity during the day as much as can safely allow and limit naps. ? ?3.  Physical Debility ?Fall risk due to change in depth perception and visual acuity from virus. ?Weakness due to increased sitting, less activity ?Home Health PT to evaluate and treat to improve balance and increase strength, home safety evaluation and evaluate need for DME to improve stability and prevent falls. ? ?4.  Palliative Care Encounter/HZV postherpetic trigeminal neuralgia with headache ?SW referral to provide counseling. ?Address sleep and if this does not resolve psychotic episodes and AV hallucinations, would recommend CT head. ?Start Gabapentin 100 mg in am and 200 mg QHS (start with HS dose on Monday to ensure accurate assessment of reaction to Haldol first). ? ? ? ?Follow up Palliative Care Visit: Palliative care will continue to follow for complex medical decision making, advance care planning, and clarification of goals. Return 2 weeks or prn. ? ? ? ?This visit was coded based on medical decision making (MDM). ? ?PPS: 50% ? ?HOSPICE ELIGIBILITY/DIAGNOSIS: TBD ? ?Chief Complaint:  ?[facility] received a referral to follow up with patient for chronic disease management of sequelae of shingles opthalmicus in left eye, insomnia and av hallucinations.  ? ?HISTORY OF PRESENT ILLNESS:  ISHAAQ PENNA is a 88 y.o. 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 Subsequently he has developed intermittent sharp, stabbing pains above and in left eye with resulting alteration in depth perception. He c/o feeling like if the ground is uneven he has to take a  bigger step which has put him off balance.  He exhibits difficulty in visual tracking of location of sound and c/o sharp stabbing pain above and in left eye with no improvement from Tylenol.  Pt began having more trouble with the  initial episode of shingles started in January. As time has gone on he has had increased trouble sleeping.  He has tried low dose Trazodone but this left him with a persistent headache. He tried Lorazepam but states waking up feeling like he has difficulty breathing but still not sleeping well or long.  He has started seeing people and having conversations with people only he can see or hear.  Recently his PCP tried Risperdal which put him to sleep but he woke up after 2 hours where daughter states "his body was awake but his mind was definitely now awake".  He still did not sleep but for short periods of time and became restless and agitated, standing on top of the bed.  Pt states "My mind never shuts off."  Wife states he was very difficult to attempt to redirect and became increasingly restless, active and agitated with attempts to reason with him.  Daughter states he has had AV hallucinations at different times of the day or night but they have noted that he begins to get worse around 4 pm in the afternoon.  Prior to current illness he was able to ambulate, bathe and dress himself independently, was active with walking and exercise, and was able to feed himself.  Currently he can feed himself finger foods but if using a utensil has to have someone feed him to get in the right area to put it in his mouth.  Daughter states he has had AVR and is on Eliquis with last fall about a week ago when he fell back and hit his head.  He has had a recent hx of hypertensive urgency, afib RVR and sepsis with endocarditis of his prosthetic AVR.  He had a recent echo 04/12/20 which did not show vegetation of the valve, EF 55-60%, no regional wall motion abnormality and Grade I impaired relaxation with mild pulmonary hypertension with estimated RVSP 37 mm HG and small pericardial effusion.  He has scheduled follow up with Cardiology PA on 09/04/21.  Although pt has good strength in his extremities he is relying on transport  wheelchair for mobility, has voluntarily curtailed walking due to risk for fall with visual change. ?Pt also has hx of PSVT, afib, carotid artery obstruction s/p bilat, chronic combined systolic and diastolic heart failure, OSA intolerant of CPAP, hypothyroidism, DJD, hx of prostate and bladder cancer (not treated surgically but with radiation and has been in remission for 10 years), and CKD stage 4, chronic low back pain and on chronic anticoagulation. ?Wife, daughter and son-in-law are helpful and supportive but are being stretched thin with caring for patient and not getting rest of their own.   ? ?History obtained from review of EMR, discussion with primary team, and interview with family and/or Mr. Yip.   ?I reviewed available labs, medications, imaging, studies and related documents from the EMR.  Records reviewed and summarized above.  ? ?ROS ?General: NAD ?EYES: Has difficulty negotiating uneven ground  ?ENMT: denies dysphagia ?Cardiovascular: denies chest pain, denies DOE ?Pulmonary: denies cough, denies increased SOB ?Abdomen: endorses fair appetite, denies constipation, endorses continence of bowel ?GU: denies dysuria, endorses continence of urine ?MSK:  endorses increased weakness and fatigue, falls reported ?Skin: denies rashes.  Has bruising to  left temple and posterior head ?Neurological: endorses sharp/stabbing intermittent burning pain above and in left eye accompanied by headache, endorses significant insomnia ?Psych: Endorses depressed mood, seeing and hearing things that no one else sees or hears ?Heme/lymph/immuno: denies bruises, abnormal bleeding ? ?Physical Exam: ?Current and past weights: 08/21/21 weight 128 lbs, prior weight 09/10/20 was 136 lbs 9.6 ounces ?Constitutional: NAD, Appears fatigued ?General: frail appearing, thin, not tracking sound visually ?EYES: anicteric sclera, lids intact, no discharge, mild scleral injection of left eye. ?ENMT: intact hearing, oral mucous membranes  moist, dentition intact ?CV: S1S2, IRIR with LUSB murmur, no LE edema ?Pulmonary: CTAB, no increased work of breathing, no cough, room air ?Abdomen: normo-active BS + 4 quadrants, soft and non tender, no ascites

## 2021-08-23 ENCOUNTER — Telehealth: Payer: Self-pay | Admitting: *Deleted

## 2021-08-23 NOTE — Telephone Encounter (Signed)
Received a request from palliative provider Damaris Hippo, NP requesting I contact daughter [name] and let her know that patient's medications should now be available at the pharmacy. I called and spoke to [place] to make her aware and she is appreciative.  ?

## 2021-08-24 ENCOUNTER — Encounter: Payer: Self-pay | Admitting: Family Medicine

## 2021-08-24 DIAGNOSIS — H547 Unspecified visual loss: Secondary | ICD-10-CM | POA: Insufficient documentation

## 2021-08-24 DIAGNOSIS — F5104 Psychophysiologic insomnia: Secondary | ICD-10-CM | POA: Insufficient documentation

## 2021-08-24 DIAGNOSIS — R5381 Other malaise: Secondary | ICD-10-CM | POA: Insufficient documentation

## 2021-08-24 DIAGNOSIS — Z515 Encounter for palliative care: Secondary | ICD-10-CM | POA: Insufficient documentation

## 2021-08-24 DIAGNOSIS — F09 Unspecified mental disorder due to known physiological condition: Secondary | ICD-10-CM | POA: Insufficient documentation

## 2021-08-26 ENCOUNTER — Inpatient Hospital Stay (HOSPITAL_BASED_OUTPATIENT_CLINIC_OR_DEPARTMENT_OTHER)
Admission: EM | Admit: 2021-08-26 | Discharge: 2021-08-29 | DRG: 309 | Disposition: A | Discharge status: Hospice | Payer: Medicare Other | Attending: Internal Medicine | Admitting: Internal Medicine

## 2021-08-26 ENCOUNTER — Emergency Department (HOSPITAL_BASED_OUTPATIENT_CLINIC_OR_DEPARTMENT_OTHER): Payer: Medicare Other

## 2021-08-26 ENCOUNTER — Other Ambulatory Visit: Payer: Self-pay

## 2021-08-26 ENCOUNTER — Encounter (HOSPITAL_BASED_OUTPATIENT_CLINIC_OR_DEPARTMENT_OTHER): Payer: Self-pay | Admitting: *Deleted

## 2021-08-26 DIAGNOSIS — I2510 Atherosclerotic heart disease of native coronary artery without angina pectoris: Secondary | ICD-10-CM | POA: Diagnosis not present

## 2021-08-26 DIAGNOSIS — R296 Repeated falls: Secondary | ICD-10-CM | POA: Diagnosis present

## 2021-08-26 DIAGNOSIS — I130 Hypertensive heart and chronic kidney disease with heart failure and stage 1 through stage 4 chronic kidney disease, or unspecified chronic kidney disease: Secondary | ICD-10-CM | POA: Diagnosis present

## 2021-08-26 DIAGNOSIS — Z952 Presence of prosthetic heart valve: Secondary | ICD-10-CM

## 2021-08-26 DIAGNOSIS — R627 Adult failure to thrive: Secondary | ICD-10-CM | POA: Diagnosis not present

## 2021-08-26 DIAGNOSIS — R410 Disorientation, unspecified: Secondary | ICD-10-CM | POA: Diagnosis not present

## 2021-08-26 DIAGNOSIS — Z955 Presence of coronary angioplasty implant and graft: Secondary | ICD-10-CM

## 2021-08-26 DIAGNOSIS — Z8551 Personal history of malignant neoplasm of bladder: Secondary | ICD-10-CM

## 2021-08-26 DIAGNOSIS — Z7401 Bed confinement status: Secondary | ICD-10-CM | POA: Diagnosis not present

## 2021-08-26 DIAGNOSIS — I4891 Unspecified atrial fibrillation: Secondary | ICD-10-CM | POA: Diagnosis not present

## 2021-08-26 DIAGNOSIS — Z91199 Patient's noncompliance with other medical treatment and regimen due to unspecified reason: Secondary | ICD-10-CM

## 2021-08-26 DIAGNOSIS — H5462 Unqualified visual loss, left eye, normal vision right eye: Secondary | ICD-10-CM | POA: Diagnosis present

## 2021-08-26 DIAGNOSIS — N184 Chronic kidney disease, stage 4 (severe): Secondary | ICD-10-CM | POA: Diagnosis present

## 2021-08-26 DIAGNOSIS — Z515 Encounter for palliative care: Secondary | ICD-10-CM | POA: Diagnosis not present

## 2021-08-26 DIAGNOSIS — Z8546 Personal history of malignant neoplasm of prostate: Secondary | ICD-10-CM | POA: Diagnosis not present

## 2021-08-26 DIAGNOSIS — Z950 Presence of cardiac pacemaker: Secondary | ICD-10-CM

## 2021-08-26 DIAGNOSIS — I495 Sick sinus syndrome: Secondary | ICD-10-CM | POA: Diagnosis present

## 2021-08-26 DIAGNOSIS — G4733 Obstructive sleep apnea (adult) (pediatric): Secondary | ICD-10-CM | POA: Diagnosis present

## 2021-08-26 DIAGNOSIS — Z79899 Other long term (current) drug therapy: Secondary | ICD-10-CM | POA: Diagnosis not present

## 2021-08-26 DIAGNOSIS — E876 Hypokalemia: Secondary | ICD-10-CM | POA: Diagnosis present

## 2021-08-26 DIAGNOSIS — R7989 Other specified abnormal findings of blood chemistry: Secondary | ICD-10-CM | POA: Diagnosis present

## 2021-08-26 DIAGNOSIS — Z8679 Personal history of other diseases of the circulatory system: Secondary | ICD-10-CM

## 2021-08-26 DIAGNOSIS — H3530 Unspecified macular degeneration: Secondary | ICD-10-CM | POA: Diagnosis present

## 2021-08-26 DIAGNOSIS — Z7901 Long term (current) use of anticoagulants: Secondary | ICD-10-CM

## 2021-08-26 DIAGNOSIS — R54 Age-related physical debility: Secondary | ICD-10-CM | POA: Diagnosis present

## 2021-08-26 DIAGNOSIS — Z8249 Family history of ischemic heart disease and other diseases of the circulatory system: Secondary | ICD-10-CM | POA: Diagnosis not present

## 2021-08-26 DIAGNOSIS — F05 Delirium due to known physiological condition: Secondary | ICD-10-CM | POA: Diagnosis present

## 2021-08-26 DIAGNOSIS — N179 Acute kidney failure, unspecified: Secondary | ICD-10-CM | POA: Diagnosis present

## 2021-08-26 DIAGNOSIS — G4700 Insomnia, unspecified: Secondary | ICD-10-CM | POA: Diagnosis present

## 2021-08-26 DIAGNOSIS — R4182 Altered mental status, unspecified: Secondary | ICD-10-CM | POA: Diagnosis not present

## 2021-08-26 DIAGNOSIS — B0230 Zoster ocular disease, unspecified: Secondary | ICD-10-CM | POA: Diagnosis present

## 2021-08-26 DIAGNOSIS — I5042 Chronic combined systolic (congestive) and diastolic (congestive) heart failure: Secondary | ICD-10-CM | POA: Diagnosis present

## 2021-08-26 DIAGNOSIS — Z66 Do not resuscitate: Secondary | ICD-10-CM | POA: Diagnosis not present

## 2021-08-26 DIAGNOSIS — B0229 Other postherpetic nervous system involvement: Secondary | ICD-10-CM | POA: Diagnosis present

## 2021-08-26 DIAGNOSIS — R531 Weakness: Secondary | ICD-10-CM

## 2021-08-26 DIAGNOSIS — Z7189 Other specified counseling: Secondary | ICD-10-CM

## 2021-08-26 DIAGNOSIS — B948 Sequelae of other specified infectious and parasitic diseases: Secondary | ICD-10-CM

## 2021-08-26 DIAGNOSIS — I10 Essential (primary) hypertension: Secondary | ICD-10-CM | POA: Diagnosis present

## 2021-08-26 DIAGNOSIS — E860 Dehydration: Secondary | ICD-10-CM | POA: Diagnosis not present

## 2021-08-26 DIAGNOSIS — I25118 Atherosclerotic heart disease of native coronary artery with other forms of angina pectoris: Secondary | ICD-10-CM | POA: Diagnosis not present

## 2021-08-26 DIAGNOSIS — I480 Paroxysmal atrial fibrillation: Principal | ICD-10-CM | POA: Diagnosis present

## 2021-08-26 DIAGNOSIS — E7800 Pure hypercholesterolemia, unspecified: Secondary | ICD-10-CM | POA: Diagnosis present

## 2021-08-26 DIAGNOSIS — I517 Cardiomegaly: Secondary | ICD-10-CM | POA: Diagnosis not present

## 2021-08-26 DIAGNOSIS — R778 Other specified abnormalities of plasma proteins: Secondary | ICD-10-CM | POA: Diagnosis present

## 2021-08-26 LAB — URINALYSIS, ROUTINE W REFLEX MICROSCOPIC
Bilirubin Urine: NEGATIVE
Glucose, UA: NEGATIVE mg/dL
Hgb urine dipstick: NEGATIVE
Ketones, ur: NEGATIVE mg/dL
Leukocytes,Ua: NEGATIVE
Nitrite: NEGATIVE
Specific Gravity, Urine: 1.017 (ref 1.005–1.030)
pH: 8 (ref 5.0–8.0)

## 2021-08-26 LAB — CBC
HCT: 42.9 % (ref 39.0–52.0)
Hemoglobin: 14.1 g/dL (ref 13.0–17.0)
MCH: 31.1 pg (ref 26.0–34.0)
MCHC: 32.9 g/dL (ref 30.0–36.0)
MCV: 94.5 fL (ref 80.0–100.0)
Platelets: 132 10*3/uL — ABNORMAL LOW (ref 150–400)
RBC: 4.54 MIL/uL (ref 4.22–5.81)
RDW: 14.1 % (ref 11.5–15.5)
WBC: 5.9 10*3/uL (ref 4.0–10.5)
nRBC: 0 % (ref 0.0–0.2)

## 2021-08-26 LAB — COMPREHENSIVE METABOLIC PANEL
ALT: 25 U/L (ref 0–44)
AST: 32 U/L (ref 15–41)
Albumin: 3.5 g/dL (ref 3.5–5.0)
Alkaline Phosphatase: 63 U/L (ref 38–126)
Anion gap: 9 (ref 5–15)
BUN: 40 mg/dL — ABNORMAL HIGH (ref 8–23)
CO2: 30 mmol/L (ref 22–32)
Calcium: 9.3 mg/dL (ref 8.9–10.3)
Chloride: 98 mmol/L (ref 98–111)
Creatinine, Ser: 1.97 mg/dL — ABNORMAL HIGH (ref 0.61–1.24)
GFR, Estimated: 32 mL/min — ABNORMAL LOW (ref >60)
Glucose, Bld: 101 mg/dL — ABNORMAL HIGH (ref 70–99)
Potassium: 3.9 mmol/L (ref 3.5–5.1)
Sodium: 137 mmol/L (ref 135–145)
Total Bilirubin: 0.8 mg/dL (ref 0.3–1.2)
Total Protein: 5.9 g/dL — ABNORMAL LOW (ref 6.5–8.1)

## 2021-08-26 LAB — TROPONIN I (HIGH SENSITIVITY)
Troponin I (High Sensitivity): 1053 ng/L (ref <18)
Troponin I (High Sensitivity): 981 ng/L (ref <18)

## 2021-08-26 LAB — LACTIC ACID, PLASMA: Lactic Acid, Venous: 1 mmol/L (ref 0.5–1.9)

## 2021-08-26 LAB — CK: Total CK: 282 U/L (ref 49–397)

## 2021-08-26 MED ORDER — OLANZAPINE 10 MG IM SOLR
5.0000 mg | Freq: Once | INTRAMUSCULAR | Status: DC
  Filled 2021-08-26: qty 10

## 2021-08-26 MED ORDER — METOPROLOL TARTRATE 5 MG/5ML IV SOLN
5.0000 mg | Freq: Once | INTRAVENOUS | Status: AC
  Administered 2021-08-26: 5 mg via INTRAVENOUS
  Filled 2021-08-26: qty 5

## 2021-08-26 MED ORDER — AMITRIPTYLINE HCL 50 MG PO TABS
25.0000 mg | ORAL_TABLET | Freq: Every day | ORAL | Status: DC
  Administered 2021-08-26: 25 mg via ORAL
  Filled 2021-08-26: qty 1

## 2021-08-26 MED ORDER — MELATONIN 3 MG PO TABS
3.0000 mg | ORAL_TABLET | Freq: Every day | ORAL | Status: DC
  Administered 2021-08-26: 3 mg via ORAL
  Filled 2021-08-26: qty 1

## 2021-08-26 MED ORDER — PREDNISOLONE ACETATE 1 % OP SUSP
1.0000 [drp] | Freq: Two times a day (BID) | OPHTHALMIC | Status: DC
  Administered 2021-08-26 – 2021-08-29 (×5): 1 [drp] via OPHTHALMIC
  Filled 2021-08-26: qty 5

## 2021-08-26 MED ORDER — SODIUM CHLORIDE 0.9 % IV SOLN: INTRAVENOUS | Status: DC

## 2021-08-26 MED ORDER — ERYTHROMYCIN 5 MG/GM OP OINT
1.0000 "application " | TOPICAL_OINTMENT | Freq: Every day | OPHTHALMIC | Status: DC
  Administered 2021-08-26 – 2021-08-27 (×2): 1 via OPHTHALMIC
  Filled 2021-08-26: qty 3.5

## 2021-08-26 MED ORDER — OFLOXACIN 0.3 % OP SOLN
1.0000 [drp] | Freq: Three times a day (TID) | OPHTHALMIC | Status: DC
  Administered 2021-08-26 – 2021-08-29 (×6): 1 [drp] via OPHTHALMIC
  Filled 2021-08-26: qty 5

## 2021-08-26 MED ORDER — METOPROLOL SUCCINATE ER 50 MG PO TB24
100.0000 mg | ORAL_TABLET | Freq: Every day | ORAL | Status: DC
Start: 2021-08-27 — End: 2021-08-27
  Administered 2021-08-27: 100 mg via ORAL
  Filled 2021-08-26: qty 2

## 2021-08-26 MED ORDER — ACETAMINOPHEN-CODEINE #3 300-30 MG PO TABS
1.0000 | ORAL_TABLET | Freq: Three times a day (TID) | ORAL | Status: DC | PRN
  Administered 2021-08-26 – 2021-08-27 (×2): 1 via ORAL
  Filled 2021-08-26 (×2): qty 1

## 2021-08-26 MED ORDER — NETARSUDIL DIMESYLATE 0.02 % OP SOLN: 1.0000 [drp] | Freq: Every day | OPHTHALMIC | Status: DC

## 2021-08-26 MED ORDER — HEPARIN SODIUM (PORCINE) 5000 UNIT/ML IJ SOLN
5000.0000 [IU] | Freq: Three times a day (TID) | INTRAMUSCULAR | Status: DC
  Administered 2021-08-26 – 2021-08-27 (×3): 5000 [IU] via SUBCUTANEOUS
  Filled 2021-08-26 (×3): qty 1

## 2021-08-26 MED ORDER — ATORVASTATIN CALCIUM 40 MG PO TABS
40.0000 mg | ORAL_TABLET | Freq: Every evening | ORAL | Status: DC
Start: 2021-08-26 — End: 2021-08-27
  Administered 2021-08-26: 40 mg via ORAL
  Filled 2021-08-26: qty 1

## 2021-08-26 MED ORDER — TRAZODONE HCL 50 MG PO TABS
25.0000 mg | ORAL_TABLET | Freq: Every day | ORAL | Status: DC
  Administered 2021-08-26: 25 mg via ORAL
  Filled 2021-08-26: qty 1

## 2021-08-26 MED ORDER — ONDANSETRON HCL 4 MG PO TABS: 4.0000 mg | ORAL_TABLET | Freq: Four times a day (QID) | ORAL | Status: DC | PRN

## 2021-08-26 MED ORDER — RISPERIDONE 1 MG PO TABS: 1.0000 mg | ORAL_TABLET | Freq: Every day | ORAL | Status: DC

## 2021-08-26 MED ORDER — ALBUTEROL SULFATE HFA 108 (90 BASE) MCG/ACT IN AERS: 2.0000 | INHALATION_SPRAY | Freq: Four times a day (QID) | RESPIRATORY_TRACT | Status: DC | PRN

## 2021-08-26 MED ORDER — LACTATED RINGERS IV BOLUS
1000.0000 mL | Freq: Once | INTRAVENOUS | Status: AC
  Administered 2021-08-26: 1000 mL via INTRAVENOUS

## 2021-08-26 MED ORDER — ALBUTEROL SULFATE (2.5 MG/3ML) 0.083% IN NEBU: 2.5000 mg | INHALATION_SOLUTION | Freq: Four times a day (QID) | RESPIRATORY_TRACT | Status: DC | PRN

## 2021-08-26 MED ORDER — HALOPERIDOL LACTATE 5 MG/ML IJ SOLN
5.0000 mg | Freq: Once | INTRAMUSCULAR | Status: AC
  Administered 2021-08-26: 5 mg via INTRAVENOUS
  Filled 2021-08-26: qty 1

## 2021-08-26 MED ORDER — AMOXICILLIN 500 MG PO CAPS
500.0000 mg | ORAL_CAPSULE | Freq: Two times a day (BID) | ORAL | Status: DC
  Administered 2021-08-26 – 2021-08-27 (×2): 500 mg via ORAL
  Filled 2021-08-26 (×2): qty 1

## 2021-08-26 MED ORDER — LATANOPROST 0.005 % OP SOLN
1.0000 [drp] | Freq: Every day | OPHTHALMIC | Status: DC
  Administered 2021-08-27: 1 [drp] via OPHTHALMIC
  Filled 2021-08-26: qty 2.5

## 2021-08-26 NOTE — H&P (Signed)
?History and Physical  ? ? ?Ryan [name] VFI:[id number] DOB: 1929-07-31 DOA: 08/26/2021 ? ?PCP: Dorothyann Peng, NP (Confirm with patient/family/NH records and if not entered, this has to be entered at [facility] point of entry) ?Patient coming from: Home ? ?I have personally briefly reviewed patient's old medical records in [place] ? ?Chief Complaint: Delirium ? ?HPI: Ryan [name] is a 88 y.o. male with medical history significant of recent herpes zoster of ophthalmicus, posttreatment, with vision loss to left eye 3 weeks ago, multivessel CAD status post RCA stenting, PAF on Eliquis, CKD stage IIIb, HTN, HLD, OSA noncompliant with CPAP, brought in by family for evaluation of delirium. ? ?Last month, patient was admitted to the hospital for herpes zoster ophthalmicus, patient received treatment IV acyclovir x7 days, and p.o. antibiotics for periorbital cellulitis.  Patient was discharged home, however patient left-sided vision eventually lost about 3 weeks ago.  Since then the patient has had deteriorated mentations, initially was sleep cycle disturbance, for which patient has tried several medications including Ativan, risperidone, haloperidol.  Condition continued to deteriorate despite all above medications.  Patient also started to have visual hallucinations and has unable to recognize family members.  Family reported the patient appears to have discomfort with left-sided eye pain and sometimes noted patient scratch scalp and pulling hair.  And for the last couple of days, patient has had frequent falls, and had to stay in bed.  Wondering about patient worsening ambulation status, family has stopped patient Eliquis since yesterday.  Outpatient palliative care also consulted. ? ?ED Course: Patient appears to be very agitated with uncontrolled A-fib.  CT head showed no acute intracranial etiology. ? ?UA negative for UTI, chest x-ray no acute infiltrates.  WBC 5.9, troponin 1000> 980.  Creatinine 1.9  compared to baseline 1.7-1.9 ? ?Review of Systems: Unable to perform, patient has altered mentation. ? ?Past Medical History:  ?Diagnosis Date  ? Age-related macular degeneration   ? Allergic rhinitis   ? 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 Anemia 12/2017  ? normocytic.  iron deficient, ferritin 17 04/2018  ? Arthritis   ? "hands, lower back" (04/30/2016)  ? Asthma   ? Bacterial endocarditis   ? Carotid artery obstruction   ? a. s/p bilat CEA.  ? Chronic combined systolic (congestive) and diastolic (congestive) heart failure ([place]) 03/18/2016  ? Chronic lower back pain   ? CKD (chronic kidney disease) stage 4, GFR 15-29 ml/min (HCC) 12/2017  ? Claudication in peripheral vascular disease ([place]) 01/01/2018  ? Claudication  ? Coronary arteriosclerosis in native artery   ? a. 2017 Cardiac catheterization demonstrated worsening CAD with 70% mid LCx, 80% OM1 and 80% mid RCA stenosis. b.  In 11/17, he underwent successful rotational atherectomy and DES to RCA.  ? Decreased diffusion capacity 10/23/2017  ? Diverticulitis of colon   ? DJD (degenerative joint disease)   ? Elevated TSH 06/30/2017  ? GERD (gastroesophageal reflux disease)   ? History of hiatal hernia   ? HLD (hyperlipidemia)   ? Hypertension   ? Hypertensive heart disease with heart failure ([place])   ? Intractable back pain 11/07/2017  ? OSA (obstructive sleep apnea)   ? intolerant to CPAP  ? PAF (paroxysmal atrial fibrillation) ([place])   ? Paroxysmal supraventricular tachycardia ([place])   ? Presence of permanent cardiac pacemaker   ? Primary malignant neoplasm of bladder ([place])   ? Prostate cancer ([place])   ? 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 enterococcal bacteremia  ?  S/P AVR 12/02/2017  ? Syncope 10/23/2017  ? Tachy-brady syndrome ([place])   ? Thrombocytopenia ([place]) 11/07/2017  ? Urinary retention 11/07/2017  ? ? ?Past Surgical History:  ?Procedure Laterality Date  ? APPENDECTOMY    ? CARDIAC CATHETERIZATION N/A 01/17/2016  ? Procedure: Right/Left Heart Cath and  Coronary Angiography;  Surgeon: Belva Crome, MD;  Location: [place] [facility];  Service: Cardiovascular;  Laterality: N/A;  ? CARDIAC CATHETERIZATION N/A 04/30/2016  ? Procedure: Coronary/Graft Atherectomy;  Surgeon: Sherren Mocha, MD;  Location: [place] [facility];  Service: Cardiovascular;  Laterality: N/A;  ? CAROTID ENDARTERECTOMY Bilateral   ? CATARACT EXTRACTION W/ INTRAOCULAR LENS  IMPLANT, BILATERAL Bilateral   ? COLON SURGERY  1981  ? Meckles Diverticulum with volvulus  ? COLONOSCOPY WITH PROPOFOL N/A 05/01/2018  ? Procedure: COLONOSCOPY WITH PROPOFOL;  Surgeon: Mansouraty, Telford Nab., MD;  Location: [place];  Service: Gastroenterology;  Laterality: N/A;  ? CORONARY ANGIOGRAPHY N/A 01/18/2018  ? Procedure: CORONARY ANGIOGRAPHY;  Surgeon: Lorretta Harp, MD;  Location: [facility] [facility];  Service: Cardiovascular;  Laterality: N/A;  ? ENTEROSCOPY N/A 04/30/2018  ? Procedure: ENTEROSCOPY;  Surgeon: Rush Landmark Telford Nab., MD;  Location: [place];  Service: Gastroenterology;  Laterality: N/A;  ? EP IMPLANTABLE DEVICE N/A 03/21/2016  ? Procedure: Pacemaker Implant;  Surgeon: Evans Lance, MD;  Location: [place] [facility];  Service: Cardiovascular;  Laterality: N/A;  ? EYE SURGERY    ? INGUINAL HERNIA REPAIR Right 2009  ? INSERT / REPLACE / REMOVE PACEMAKER    ? INSERTION PROSTATE RADIATION SEED    ? POLYPECTOMY  05/01/2018  ? Procedure: POLYPECTOMY;  Surgeon: Rush Landmark Telford Nab., MD;  Location: [place];  Service: Gastroenterology;;  ? TEE WITHOUT CARDIOVERSION N/A 06/10/2016  ? Procedure: TRANSESOPHAGEAL ECHOCARDIOGRAM (TEE);  Surgeon: Sherren Mocha, MD;  Location: [place];  Service: Open Heart Surgery;  Laterality: N/A;  ? TEE WITHOUT CARDIOVERSION N/A 11/10/2017  ? Procedure: TRANSESOPHAGEAL ECHOCARDIOGRAM (TEE);  Surgeon: Acie Fredrickson Wonda Cheng, MD;  Location: [place];  Service: Cardiovascular;  Laterality: N/A;  ? TEE WITHOUT CARDIOVERSION N/A 12/15/2017  ? Procedure: TRANSESOPHAGEAL  ECHOCARDIOGRAM (TEE);  Surgeon: Buford Dresser, MD;  Location: [facility] END[facility];  Service: Cardiovascular;  Laterality: N/A;  ? TEE WITHOUT CARDIOVERSION N/A 03/30/2018  ? Procedure: TRANSESOPHAGEAL ECHOCARDIOGRAM (TEE);  Surgeon: Jerline Pain, MD;  Location: [facility] ENDOSCOPY;  Service: Cardiovascular;  Laterality: N/A;  ? TONSILLECTOMY  ~ 1947  ? TRANSCATHETER AORTIC VALVE REPLACEMENT, TRANSFEMORAL N/A 06/10/2016  ? Procedure: TRANSCATHETER AORTIC VALVE REPLACEMENT, TRANSFEMORAL;  Surgeon: Sherren Mocha, MD;  Location: [place];  Service: Open Heart Surgery;  Laterality: N/A;  ? ? ? reports that he has never smoked. He has never used smokeless tobacco. He reports that he does not drink alcohol and does not use drugs. ? ?Allergies  ?Allergen Reactions  ? Ciprofloxacin Rash  ? Hydrochlorothiazide Rash  ? Sulfa Antibiotics Anaphylaxis  ? Ace Inhibitors Cough  ? Losartan Cough  ? Carvedilol Cough  ?  Pt states it "causes him too cough."  ? Lisinopril Cough  ?  Pt reports worsening cough and "feeling wheezy."  ? Soy Allergy Nausea And Vomiting  ? ? ?Family History  ?Problem Relation Age of Onset  ? Heart disease Mother   ? Heart disease Father   ? ? ? ?Prior to Admission medications   ?Medication Sig Start Date End Date Taking? Authorizing Provider  ?acetaminophen-codeine (TYLENOL #3) 300-30 MG tablet Take 1 tablet by mouth every 8 (  eight) hours as needed for moderate pain. 08/02/21  Yes Nafziger, Tommi Rumps, NP  ?albuterol (VENTOLIN HFA) 108 (90 Base) MCG/ACT inhaler Inhale 2 puffs into the lungs every 6 (six) hours as needed for wheezing or shortness of breath. 06/18/21  Yes Nafziger, Tommi Rumps, NP  ?amLODipine (NORVASC) 5 MG tablet TAKE 1 TABLET(5 MG) BY MOUTH DAILY ?Patient taking differently: Take 5 mg by mouth daily. 05/08/21  Yes Croitoru, Mihai, MD  ?amoxicillin (AMOXIL) 500 MG tablet TAKE 1 TABLET(500 MG) BY MOUTH TWICE DAILY ?Patient taking differently: Take 500 mg by mouth 2 (two) times daily. 03/26/21  Yes Comer, Okey Regal,  MD  ?atorvastatin (LIPITOR) 40 MG tablet TAKE 1 TABLET(40 MG) BY MOUTH DAILY AT 6 PM ?Patient taking differently: Take 40 mg by mouth every evening. 10/31/20  Yes Croitoru, Dani Gobble, MD  ?diphenhydramine-acetamin

## 2021-08-26 NOTE — ED Notes (Signed)
Patient had a large BM on the bedpan. Patient is able to communicate when he needs to go to the bathroom and is not able to tell when he hurts. He is speaking with his eyes closed whenever questions are asked. Patient given some ice chips to suck on due to the dryness of his mucous membranes ?
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
## 2021-08-26 NOTE — ED Provider Notes (Signed)
?[facility] EMERGENCY DEPT ?Provider Note ? ? ?CSN: [id number] ?Arrival date & time: 08/26/21  4503 ? ?  ? ?History ? ?Chief Complaint  ?Patient presents with  ? Altered Mental Status  ? ? ?Ryan [name] is a 88 y.o. male. ? 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  Patient has been home with family since discharge from the hospital and unfortunately over the last 2 weeks he has had a continued decline and more significantly in the last 72 hours.  Family reports that since coming home and having decreased vision he has had some episodes of delirium which initially were just in the evening times with some difficulty sleeping.  However this has progressed to having hallucinations throughout the day, having very poor oral intake and he has not slept for the last 4 nights.  Patient has become so weak he cannot stand or walk.  They have involved palliative care who recently did a intake last week and has started him on some Haldol but family reports that that only helped for 1 night and has not helped since.  They have not noticed any cough or congestion.  Patient has had very minimal amounts of lucidness in the last 72 hours.  Prior to this patient had no history of dementia had normal mental status and was able to ambulate with a walker but effectively.  He now is unable to even feed himself. ? ?The history is provided by a relative and medical records.  ?Altered Mental Status ?Presenting symptoms: behavior changes   ? ?   ? ?Home Medications ?Prior to Admission medications   ?Medication Sig Start Date End Date Taking? Authorizing Provider  ?acetaminophen-codeine (TYLENOL #3) 300-30 MG tablet Take 1 tablet by mouth every 8 (eight) hours as needed for moderate pain. 08/02/21   Nafziger, Tommi Rumps, NP  ?albuterol (VENTOLIN HFA) 108 (90 Base) MCG/ACT inhaler Inhale 2 puffs into the lungs every 6 (six) hours as needed for wheezing or shortness of breath. 06/18/21   Nafziger, Tommi Rumps, NP  ?amLODipine (NORVASC) 5 MG tablet TAKE 1 TABLET(5 MG) BY MOUTH DAILY ?Patient taking differently: Take 5 mg by mouth daily. 05/08/21   Croitoru, Mihai, MD  ?amoxicillin (AMOXIL) 500 MG tablet TAKE 1 TABLET(500 MG) BY MOUTH TWICE DAILY ?Patient taking differently: Take 500 mg by mouth 2 (two) times daily. 03/26/21   Thayer Headings, MD  ?atorvastatin (LIPITOR) 40 MG tablet TAKE 1 TABLET(40 MG) BY MOUTH DAILY AT 6 PM ?Patient taking differently: Take 40 mg by mouth every evening. 10/31/20   Croitoru, Mihai, MD  ?diphenhydramine-acetaminophen (TYLENOL PM) 25-500 MG TABS tablet Take 1 tablet by mouth at bedtime as needed (sleep).    [provider]  ?ELIQUIS 2.5 MG TABS tablet TAKE 1 TABLET(2.5 MG) BY MOUTH TWICE DAILY ?Patient taking differently: Take 2.5 mg by mouth 2 (two) times daily. 03/15/21   Croitoru, Mihai, MD  ?erythromycin ophthalmic ointment 1 application in the morning, at noon, and at bedtime. Apply to both eye lids for 7 days 07/25/21   [provider]  ?isosorbide mononitrate (IMDUR)  30 MG 24 hr tablet Take 1 tablet (30 mg total) by mouth daily. 09/10/20   Croitoru, Mihai, MD  ?latanoprost (XALATAN) 0.005 % ophthalmic solution Place 1 drop into both eyes at bedtime.    [provider]  ?LORazepam (ATIVAN) 0.5 MG tablet Take 0.5-1 tablets (0.25-0.5 mg total) by mouth 2 (two) times daily as needed for anxiety. 08/19/21   Nafziger, Tommi Rumps, NP  ?metoprolol succinate (TOPROL-XL) 100 MG 24 hr tablet Take 1 tablet (100 mg total) by mouth  daily. Take with or immediately following a meal. 09/10/20   Croitoru, Dani Gobble, MD  ?nitroGLYCERIN (NITROSTAT) 0.4 MG SL tablet Place 1 tablet (0.4 mg total) under the tongue every 5 (five) minutes x 3 doses as needed for chest pain. 07/20/19   Dorothy Spark, MD  ?ofloxacin (OCUFLOX) 0.3 % ophthalmic solution Place 1 drop into the left eye 3 (three) times daily. 08/08/21   [provider]  ?ondansetron (ZOFRAN) 4 MG tablet Take 1 tablet (4 mg total) by mouth every 6 (six) hours as needed for nausea. 07/29/21   Raiford Noble Latif, DO  ?polyethylene glycol powder (GLYCOLAX/MIRALAX) 17 GM/SCOOP powder Take 17 g by mouth daily as needed for mild constipation. 07/29/21   Raiford Noble Latif, DO  ?prednisoLONE acetate (PRED FORTE) 1 % ophthalmic suspension Place 1 drop into the left eye 2 (two) times daily. 08/07/21   [provider]  ?RHOPRESSA 0.02 % SOLN Place 1 drop into both eyes at bedtime. 07/02/21   [provider]  ?risperiDONE (RISPERDAL) 1 MG tablet Take 1 tablet (1 mg total) by mouth at bedtime. 08/21/21   Dorothyann Peng, NP  ?   ? ?Allergies    ?Ciprofloxacin, Hydrochlorothiazide, Sulfa antibiotics, Ace inhibitors, Losartan, Carvedilol, Lisinopril, and Soy allergy   ? ?Review of Systems   ?Review of Systems ? ?Physical Exam ?Updated Vital Signs ?BP 130/88   Pulse (!) 38   Temp 98.2 ?F (36.8 ?C) (Oral)   Resp 19   SpO2 98%  ?Physical Exam ?Vitals and nursing note reviewed.  ?Constitutional:   ?   General: He is not in acute distress. ?   Appearance: He is well-developed.  ?   Comments: Currently sleeping, chronically ill-appearing  ?HENT:  ?   Head: Normocephalic and atraumatic.  ?   Comments: Healing shingles over the left side of the scalp with scabbed vesicles ?   Mouth/Throat:  ?   Mouth: Mucous membranes are dry.  ?Eyes:  ?   Conjunctiva/sclera: Conjunctivae normal.  ?   Pupils: Pupils are equal, round, and reactive to light.  ?Cardiovascular:  ?   Rate and Rhythm: Normal rate and  regular rhythm.  ?   Heart sounds: No murmur heard. ?Pulmonary:  ?   Effort: Pulmonary effort is normal. No respiratory distress.  ?   Breath sounds: Normal breath sounds. No wheezing or rales.  ?   Comments: Pacemaker present in the left upper chest ?Abdominal:  ?   General: There is no distension.  ?   Palpations: Abdomen is soft.  ?   Tenderness: There is no abdominal tenderness. There is no guarding or rebound.  ?Musculoskeletal:     ?   General: No tenderness. Normal range of motion.  ?   Cervical back: Normal range of motion and neck supple.  ?   Right lower leg: No edema.  ?   Left lower leg: No edema.  ?Skin: ?   General: Skin is warm and dry.  ?  Findings: No erythema or rash.  ?Neurological:  ?   Comments: Patient really not responding to verbal stimuli at this time other than grunting.  Not making coherent conversation.  However approximately 2 or 3 hours after the initial exam when daughter is present at bedside he does say yes and no to some questions  ?Psychiatric:  ?   Comments: Altered  ? ? ?ED Results / Procedures / Treatments   ?Labs ?(all labs ordered are listed, but only abnormal results are displayed) ?Labs Reviewed  ?COMPREHENSIVE METABOLIC PANEL - Abnormal; Notable for the following components:  ?    Result Value  ? Glucose, Bld 101 (*)   ? BUN 40 (*)   ? Creatinine, Ser 1.97 (*)   ? Total Protein 5.9 (*)   ? GFR, Estimated 32 (*)   ? All other components within normal limits  ?CBC - Abnormal; Notable for the following components:  ? Platelets 132 (*)   ? All other components within normal limits  ?URINALYSIS, ROUTINE W REFLEX MICROSCOPIC - Abnormal; Notable for the following components:  ? APPearance HAZY (*)   ? Protein, ur TRACE (*)   ? Bacteria, UA RARE (*)   ? All other components within normal limits  ?TROPONIN I (HIGH SENSITIVITY) - Abnormal; Notable for the following components:  ? Troponin I (High Sensitivity) 1,053 (*)   ? All other components within normal limits  ?LACTIC ACID,  PLASMA  ?TROPONIN I (HIGH SENSITIVITY)  ? ? ?EKG ?EKG Interpretation ? ?Date/Time:  Monday August 26 2021 09:15:46 EDT ?Ventricular Rate:  139 ?PR Interval:  178 ?QRS Duration: 122 ?QT Interval:  292 ?QTC Calcula

## 2021-08-26 NOTE — ED Notes (Signed)
Patient is agitated and pulling at lines. MD made aware. Troponin 1053, MD Plunkett made aware ?

## 2021-08-26 NOTE — ED Notes (Signed)
RN and EMT assisted patient to be repositioned in the bed. Patient is on his side now with extra pillows for comfort, daughter is at bedside. Patient attempted to have another BM and was unable to. Patient did urinate. Patient is actively confused and still speaking with eyes closed.  ?

## 2021-08-26 NOTE — ED Notes (Signed)
Patient given snacks with MD approval. Patient's daughter wishes to discuss DNR for admission and is tearful at this time. She reports a very rapid change over the last few weeks that has been difficult to manage. MD is aware of patient preference for DNR for admission. Patient reports he is catholic, will mention this to RN when patient has a bed assigned at [name] for religious preferences.  ?

## 2021-08-26 NOTE — ED Notes (Signed)
Patient placed on bedpan at this time.
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
## 2021-08-26 NOTE — ED Notes (Signed)
ED Provider at bedside. 

## 2021-08-26 NOTE — Consult Note (Addendum)
?Cardiology Consultation:  ? ?Patient ID: Ryan [name] ?MRN: [id number]; DOB: [birth year] ? ?Admit date: 08/26/2021 ?Date of Consult: 08/26/2021 ? ?PCP:  Dorothyann Peng, NP ?  ?[facility] ?Cardiologist:  Sanda Klein, MD   }   ? ? ?Patient Profile:  ? ?Ryan [name] is a 88 y.o. male with a hx of carotid artery disease status post bilateral CEA, CAD status post DES to RCA in 2017 with 70% OM, aortic stenosis status post TAVR 2018, enterococcal endocarditis 2019, hypertension, hyperlipidemia, OSA, chronic combined CHF, sick sinus syndrome status post PPM, paroxysmal atrial fibrillation, amiodarone induced lung toxicity and CKD stage IV who is being seen 08/26/2021 for the evaluation of atrial fibrillation with RVR at the request of Dr. Roosevelt Locks. ? ?History of Present Illness:  ? ?Ryan [name] is an 88 year old male with past medical history noted above.  He was previously followed by Dr. Meda Coffee and transferred care to Dr. Sallyanne Kuster.  Extensive cardiac history noted above.  He had an episode of bacteremia with Staphylococcus hominis October 2021 without clear-cut clinical infectious syndrome with normal CRP and mildly elevated ESR and was treated with a 1 year course of oral amoxicillin. ? ?Echocardiogram 04/2020 showed LVEF of 55 to 60%, no regional wall motion abnormality, grade 1 diastolic dysfunction, normal RV, mildly elevated pulmonary artery pressures, mildly dilated left atrial size and small pericardial effusion.  Aortic valve mean gradient of 13 mmHg. ? ?He was last seen in the office on 08/2020 and reported doing well.  He was exercising daily about 45 minutes on a reclining bike as well as walking.  Noted that he was mostly limited by aching joints.  Rarely had episodes of dyspnea with activity.  Had not required any nitroglycerin or diuretics.  Denied any angina.  Noted to be on chronic anticoagulation with Eliquis and had not missed any doses.  He remained clinically euvolemic without the  need for diuretics.  He was not having any angina and his Imdur was reduced to 30 mg daily.  Noted to be virtually 100% a paced V sensed.  There was a brief discussion between him and Dr. Jesusita Oka about his advanced kidney disease and possibly seeing nephrology as an outpatient.  He stated he would not be interested in further evaluation. ? ?He was recently admitted to the hospital about 3 weeks prior due to ocular shingles and lost his left-sided vision.  Since being home he was suffering from insomnia and visual along with auditory hallucinations/delusions.  He was started on trazodone 5 mg nightly and Ativan 0.25 mg twice daily.  Had had some improvement in his sleep schedule.  He has been referred to palliative care as an outpatient. ? ?[facility] on [facility]/27 by his daughter reporting he had not slept in 4 days, along with increased confusion and hallucinations.  Dated he had become so weak he could not stand or walk.  During his work-up in the ED he went into atrial fibrillation with RVR. ? ?Labs in the ED showed sodium 137, potassium 3.9, creatinine 1.9, high-sensitivity troponin 1053>> 981, lactic acid 1, WBC 5.9, hemoglobin 14.1.  Chest x-ray without acute finding.  CT head with multifocal nonacute encephalomalacia involving bilateral frontal lobes and left occipital lobe.  He was given IV Lopressor without significant improvement in heart rate.  Internal medicine admitted for further evaluation cardiology now asked to evaluate in the setting of A-fib RVR. ? ?In talking with family he has had significant  decline over the past couple of weeks. He is no longer "himself", confused and combative. Does not know his family. He was seen by palliative care last week for initial evaluation.  ? ?Past Medical History:  ?Diagnosis Date  ? Age-related macular degeneration   ? Allergic rhinitis   ? 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 Anemia 12/2017  ? normocytic.  iron deficient, ferritin 17  04/2018  ? Arthritis   ? "hands, lower back" (04/30/2016)  ? Asthma   ? Bacterial endocarditis   ? Carotid artery obstruction   ? a. s/p bilat CEA.  ? Chronic combined systolic (congestive) and diastolic (congestive) heart failure ([place]) 03/18/2016  ? Chronic lower back pain   ? CKD (chronic kidney disease) stage 4, GFR 15-29 ml/min (HCC) 12/2017  ? Claudication in peripheral vascular disease ([place]) 01/01/2018  ? Claudication  ? Coronary arteriosclerosis in native artery   ? a. 2017 Cardiac catheterization demonstrated worsening CAD with 70% mid LCx, 80% OM1 and 80% mid RCA stenosis. b.  In 11/17, he underwent successful rotational atherectomy and DES to RCA.  ? Decreased diffusion capacity 10/23/2017  ? Diverticulitis of colon   ? DJD (degenerative joint disease)   ? Elevated TSH 06/30/2017  ? GERD (gastroesophageal reflux disease)   ? History of hiatal hernia   ? HLD (hyperlipidemia)   ? Hypertension   ? Hypertensive heart disease with heart failure ([place])   ? Intractable back pain 11/07/2017  ? OSA (obstructive sleep apnea)   ? intolerant to CPAP  ? PAF (paroxysmal atrial fibrillation) ([place])   ? Paroxysmal supraventricular tachycardia ([place])   ? Presence of permanent cardiac pacemaker   ? Primary malignant neoplasm of bladder ([place])   ? Prostate cancer ([place])   ? 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 enterococcal bacteremia  ? S/P AVR 12/02/2017  ? Syncope 10/23/2017  ? Tachy-brady syndrome ([place])   ? Thrombocytopenia ([place]) 11/07/2017  ? Urinary retention 11/07/2017  ? ? ?Past Surgical History:  ?Procedure Laterality Date  ? APPENDECTOMY    ? CARDIAC CATHETERIZATION N/A 01/17/2016  ? Procedure: Right/Left Heart Cath and Coronary Angiography;  Surgeon: Belva Crome, MD;  Location: [facility];  Service: Cardiovascular;  Laterality: N/A;  ? CARDIAC CATHETERIZATION N/A 04/30/2016  ? Procedure: Coronary/Graft Atherectomy;  Surgeon: Sherren Mocha, MD;  Location: [place] [facility];  Service: Cardiovascular;   Laterality: N/A;  ? CAROTID ENDARTERECTOMY Bilateral   ? CATARACT EXTRACTION W/ INTRAOCULAR LENS  IMPLANT, BILATERAL Bilateral   ? COLON SURGERY  1981  ? Meckles Diverticulum with volvulus  ? COLONOSCOPY WITH PROPOFOL N/A 05/01/2018  ? Procedure: COLONOSCOPY WITH PROPOFOL;  Surgeon: Mansouraty, Telford Nab., MD;  Location: [facility];  Service: Gastroenterology;  Laterality: N/A;  ? CORONARY ANGIOGRAPHY N/A 01/18/2018  ? Procedure: CORONARY ANGIOGRAPHY;  Surgeon: Lorretta Harp, MD;  Location: [facility];  Service: Cardiovascular;  Laterality: N/A;  ? ENTEROSCOPY N/A 04/30/2018  ? Procedure: ENTEROSCOPY;  Surgeon: Rush Landmark Telford Nab., MD;  Location: [facility];  Service: Gastroenterology;  Laterality: N/A;  ? EP IMPLANTABLE DEVICE N/A 03/21/2016  ? Procedure: Pacemaker Implant;  Surgeon: Evans Lance, MD;  Location: [facility];  Service: Cardiovascular;  Laterality: N/A;  ? EYE SURGERY    ? INGUINAL HERNIA REPAIR Right 2009  ? INSERT / REPLACE / REMOVE PACEMAKER    ? INSERTION PROSTATE RADIATION SEED    ? POLYPECTOMY  05/01/2018  ? Procedure: POLYPECTOMY;  Surgeon: Justice Britain  [name], MD;  Location: [facility];  Service: Gastroenterology;;  ? TEE WITHOUT CARDIOVERSION N/A 06/10/2016  ? Procedure: TRANSESOPHAGEAL ECHOCARDIOGRAM (TEE);  Surgeon: Sherren Mocha, MD;  Location: [facility];  Service: Open Heart Surgery;  Laterality: N/A;  ? TEE WITHOUT CARDIOVERSION N/A 11/10/2017  ? Procedure: TRANSESOPHAGEAL ECHOCARDIOGRAM (TEE);  Surgeon: Acie Fredrickson Wonda Cheng, MD;  Location: [facility];  Service: Cardiovascular;  Laterality: N/A;  ? TEE WITHOUT CARDIOVERSION N/A 12/15/2017  ? Procedure: TRANSESOPHAGEAL ECHOCARDIOGRAM (TEE);  Surgeon: Buford Dresser, MD;  Location: [facility];  Service: Cardiovascular;  Laterality: N/A;  ? TEE WITHOUT CARDIOVERSION N/A 03/30/2018  ? Procedure: TRANSESOPHAGEAL ECHOCARDIOGRAM (TEE);  Surgeon: Jerline Pain, MD;  Location: [facility] ENDOSCOPY;  Service:  Cardiovascular;  Laterality: N/A;  ? TONSILLECTOMY  ~ 1947  ? TRANSCATHETER AORTIC VALVE REPLACEMENT, TRANSFEMORAL N/A 06/10/2016  ? Procedure: TRANSCATHETER AORTIC VALVE REPLACEMENT, TRANSFEMORAL;  Surgeon: Pricilla Holm

## 2021-08-26 NOTE — ED Notes (Addendum)
RT Note: Pt. noted to have desaturations into the 80's~88-90% while sleeping, hx. of OSA, placed on 2 lpm n/c, RN made aware, RT to monitor. ?

## 2021-08-26 NOTE — ED Notes (Signed)
Frequent rounding on patient due to altered mental status, bed rails up, Male pure-wick applied. Patient is still confused and not ambulatory. ?

## 2021-08-27 DIAGNOSIS — Z7189 Other specified counseling: Secondary | ICD-10-CM | POA: Diagnosis not present

## 2021-08-27 DIAGNOSIS — R410 Disorientation, unspecified: Secondary | ICD-10-CM | POA: Diagnosis not present

## 2021-08-27 LAB — CBC
HCT: 43.8 % (ref 39.0–52.0)
Hemoglobin: 14.5 g/dL (ref 13.0–17.0)
MCH: 31.7 pg (ref 26.0–34.0)
MCHC: 33.1 g/dL (ref 30.0–36.0)
MCV: 95.8 fL (ref 80.0–100.0)
Platelets: 129 10*3/uL — ABNORMAL LOW (ref 150–400)
RBC: 4.57 MIL/uL (ref 4.22–5.81)
RDW: 14.2 % (ref 11.5–15.5)
WBC: 5.9 10*3/uL (ref 4.0–10.5)
nRBC: 0 % (ref 0.0–0.2)

## 2021-08-27 LAB — BASIC METABOLIC PANEL
Anion gap: 9 (ref 5–15)
BUN: 29 mg/dL — ABNORMAL HIGH (ref 8–23)
CO2: 26 mmol/L (ref 22–32)
Calcium: 8.5 mg/dL — ABNORMAL LOW (ref 8.9–10.3)
Chloride: 103 mmol/L (ref 98–111)
Creatinine, Ser: 1.81 mg/dL — ABNORMAL HIGH (ref 0.61–1.24)
GFR, Estimated: 35 mL/min — ABNORMAL LOW (ref >60)
Glucose, Bld: 122 mg/dL — ABNORMAL HIGH (ref 70–99)
Potassium: 3.4 mmol/L — ABNORMAL LOW (ref 3.5–5.1)
Sodium: 138 mmol/L (ref 135–145)

## 2021-08-27 MED ORDER — QUETIAPINE FUMARATE 25 MG PO TABS
50.0000 mg | ORAL_TABLET | Freq: Every day | ORAL | Status: DC
  Administered 2021-08-27 – 2021-08-28 (×2): 50 mg via ORAL
  Filled 2021-08-27 (×2): qty 2

## 2021-08-27 MED ORDER — MELATONIN 3 MG PO TABS
3.0000 mg | ORAL_TABLET | Freq: Every day | ORAL | Status: DC
  Administered 2021-08-27 – 2021-08-28 (×2): 3 mg via ORAL
  Filled 2021-08-27 (×2): qty 1

## 2021-08-27 MED ORDER — HYDROMORPHONE HCL 1 MG/ML IJ SOLN
1.0000 mg | INTRAMUSCULAR | Status: AC
Start: 2021-08-27 — End: 2021-08-27
  Administered 2021-08-27: 1 mg via INTRAVENOUS
  Filled 2021-08-27: qty 1

## 2021-08-27 MED ORDER — GABAPENTIN 100 MG PO CAPS
100.0000 mg | ORAL_CAPSULE | Freq: Three times a day (TID) | ORAL | Status: DC
Start: 2021-08-27 — End: 2021-08-29
  Administered 2021-08-27 – 2021-08-29 (×5): 100 mg via ORAL
  Filled 2021-08-27 (×7): qty 1

## 2021-08-27 MED ORDER — HYDROMORPHONE HCL 1 MG/ML IJ SOLN
1.0000 mg | INTRAMUSCULAR | Status: DC | PRN
  Administered 2021-08-28: 1 mg via INTRAVENOUS
  Filled 2021-08-27: qty 1

## 2021-08-27 MED ORDER — QUETIAPINE FUMARATE 25 MG PO TABS: 25.0000 mg | ORAL_TABLET | Freq: Two times a day (BID) | ORAL | Status: DC | PRN

## 2021-08-27 MED ORDER — SODIUM CHLORIDE 0.9 % IV SOLN: INTRAVENOUS | Status: DC

## 2021-08-27 MED ORDER — POTASSIUM CHLORIDE CRYS ER 20 MEQ PO TBCR
40.0000 meq | EXTENDED_RELEASE_TABLET | Freq: Once | ORAL | Status: AC
  Administered 2021-08-27: 40 meq via ORAL
  Filled 2021-08-27: qty 2

## 2021-08-27 MED ORDER — DIVALPROEX SODIUM 250 MG PO DR TAB
250.0000 mg | DELAYED_RELEASE_TABLET | Freq: Two times a day (BID) | ORAL | Status: DC
  Administered 2021-08-27 – 2021-08-28 (×3): 250 mg via ORAL
  Filled 2021-08-27 (×4): qty 1

## 2021-08-27 NOTE — Consult Note (Signed)
[facility] New Face-to-Face Psychiatric Evaluation ? ? ?Service Date: August 27, 2021 ?LOS:  LOS: 1 day  ? ? ?Assessment  ?Ryan [name] is a 88 y.o. male admitted medically for 08/26/2021  6:34 AM for delirium with failure of outpt management. He has no premorbid psychiatric history. Marland Kitchen He carries the psychiatric diagnoses of  and has an extensive past medical history most significant for recent loss of sight in one eye and 30% vision in the other (please see EMR for full medicla history); importantly was in fairly good physical health prior to this (no memory issues, walking, completing ADLs) .Psychiatry was consulted for management of agitation by Shelly Coss, MD.  ? ?At this time, the patient's presentation is most consistent with mixed delirium, most likely due to multiple etiologies including but not limited to infection, medications, pain, altered sleep/wake cycle, and limited mobility. Has been quite aggressive per report but was sleeping through the majority of my assessment. The patient would strongly benefit from medical treatment of any underlying medical cause of delirium. During this time period, minimization of delirogenic insults will be of utmost importance; this includes promoting the normal circadian cycle, minimizing lines/tubes, avoiding deliriogenic medications such as benzodiazepines and anticholinergic medications, and frequently reorienting the patient. Symptomatic treatment for agitation can be provided by antipsychotic medications, though it is important to remember that these do not treat the underlying etiology of delirium. Notably, there can be a time lag effect between treatment of a medical problem and resolution of delirium. This time lag effect may be of longer duration in the elderly, and those with underlying cognitive impairment or brain injury. 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 For these reasons will largely focus on optimizing sleep cycle and treating aggression; discussed r/b/se of medication plan below with daughter at bedside.  ? ? ?Diagnoses:  ?Active Hospital problems: ?Principal Problem: ?  Delirium ?Active Problems: ?  Coronary artery disease of native artery of native heart with stable angina pectoris ([place]) ?  PAF (paroxysmal atrial fibrillation) ([place]) ?  Pure hypercholesterolemia ?  Elevated troponin ?  Hypertension ?  ? ? ?Plan  ?## Safety and Observation Level:  ?- Based on my clinical evaluation, I estimate the patient to be at high risk of unintentional self harm in the current setting due to delirium ?-  Will defer to primary team; has benefitted from sitter thus far in admission.  ? ? ?## Medical Decision Making Capacity:  ?Not formally assessed ? ?## Medications ?-- s depakote 250 mg BID (OK to convert to IV or sprinkles if pt refuses PO) ?-- s quetiapine 50 mg QHS; have put in additional 25 mg PRN dose for pt being awake between 10 and 6 and/or first line for agitation if pt can take oral meds ?- change melatonin from 10 PM to 6 PM ?- STOP elavil ?- STOP trazodone ? ?## Further Work-up:  ?-- Defer to primary - family's main focus is sx control and comfort ?Briefly discussed EEG v LP with primary team (likely low yield and invasive); daughter had declined these interventions preferring to focus on sx ? ?-- most recent EKG on 3/27 had QtC of 444 ?-- Pertinent labwork reviewed earlier  this admission includes: CMP (albumin wnl, AKI), troponin (elevated), u/a  (no evidence of UTI)  ? ?-- had TSH in 2/23 which was WNL ? ?-- other labs such as B12, RPR, etc do not fit clinical picture of rapid decline with known precipitant.   ? ?## Disposition:  ?-- per primary/palliative ? ?## Behavioral / Environmental:  ?-- Delirium Interventions for Nursing and Staff: ?- RN to open blinds every AM. ?- To Bedside: Glasses, hearing aide, and pt's own shoes. Make available to patients. when possible and encourage use. ?- Encourage po fluids when appropriate, keep fluids within reach. ?- OOB to chair with meals. ?- Passive ROM exercises to all extremities with AM & PM care. ?- RN to assess orientation to person, time and place QAM and PRN. ?- Recommend extended visitation hours with familiar family/friends as feasible. ?- Staff to minimize disturbances at night. Turn off television when pt asleep or when not in use. ? ?##Legal Status ? ? ?Thank you for this consult request. Recommendations have been communicated to the primary team.  We will continue to follow at this time.  ? ?Rosary Filosa A Damiya Sandefur ? ? ?NEW  history  ?Relevant Aspects of Hospital Course:  ?Admitted on 08/26/2021 for delirium with failure of outpt management. From H&P: ? ?Last month, patient was admitted to the hospital for herpes zoster ophthalmicus, patient received treatment IV acyclovir x7 days, and p.o. antibiotics for periorbital cellulitis.  Patient was discharged home, however patient left-sided vision eventually lost about 3 weeks ago.  Since then the patient has had deteriorated mentations, initially was sleep cycle disturbance, for which patient has tried several medications including Ativan, risperidone, haloperidol.  Condition continued to deteriorate despite all above medications.  Patient also started to have visual hallucinations and has unable to recognize family members.  Family reported the patient appears to have discomfort with left-sided eye pain and sometimes noted patient scratch scalp and pulling hair.  And for the last couple of days, patient has had frequent falls, and had to stay in bed.  Wondering about patient worsening ambulation status, family has stopped  patient Eliquis since yesterday.  Outpatient palliative care also consulted. ? ?Notably pt with poor vision in R eye. Prior to above, had no history of dementia, normal mental status, and able to effectively ambulate with a walker.  ? ?Patient Report:  ?Pt seen in late AM; he naps through majority of interview with his daughter. When he wakes up, he endorses a "confused" mood but does fairly well on brief bedside neurocognitive testing. Able to name 6/7 on DOWB, did 3/4 on CAM-ICU, and was oriented to self and general situation. No evidence of rigidity or clonus on exam (did have large lipoma in L arm). Denied any SI or HI; stated he sometimes sees things that aren't there that confuse him. Was not able to test orientation to year/month/date (pt very sedated). Interestingly did have some motor perseveration (after testing for rigidity he kept moving arm back and forth for 3-4 cycles and had to be told to rest).  ? ?Spent most of interview talking to daughter and provided psychoeducation about delirium and talking about non-pharmacologic interventions family can support (stressing re-orientation and keeping pt awake between lunch and dinner). Did discuss delirium is sometimes reversible but pts do not always return to baseline. Discussed r/b/se of all medication changes - daughter stated she doesn't care so much about side effects as controlling symptoms. Provided brief education on purpose of hospice which she has been  considering; ultimately stressed importance of taking things one day at a time. Daughter was tearful throughout many points of conversation, especially when talking about dad's recent aggression and rapid personality changes. Shares that at baseline he is "the sweetest person" and that she is thankful he does not remember being aggressive to her or her mom. Has had a few lucid intervals since this started which are getting fewer and farther between.  ?  ?ROS:  ?See above ? ?Collateral information:   ?RN overnight note entered 0600 3/28: ?The patient is injury-free, afebrile, disoriented X 3. Vital signs were within the baseline during this shift. He was confused and irritated off and on throughout the shi

## 2021-08-27 NOTE — Progress Notes (Addendum)
PROGRESS NOTE  Ryan [name]  QIO:[id number] DOB: 03-25-1930 DOA: 08/26/2021 PCP: Shirline Frees, NP   Brief Narrative: Patient is a 88 year old male with history of recent herpes zoster ophthalmicus with vision loss of left eye, multivessel coronary disease status post RCA stenting, paroxysmal A-fib on Eliquis, CKD stage IIIb, hypertension, hyperlipidemia, OSA noncompliant with CPAP who was brought from home by family for the evaluation of confusion.  He was admitted last month for the treatment of herpes zoster opthalmicus, received IV acyclovir and oral antibiotics for periorbital cellulitis and was discharged home.  Patient eventually lost his left eye vision about 3 weeks ago.  Since then his mental status deteriorated with confusion, delirium, hallucination, frequent falls.  Was on several antipsychotic medications at home.  On presentation, he was agitated, was in rapid A-fib.  CT head did not show any acute intracranial pathology.  UA was negative for UTI, chest x-ray did not show any pneumonia.  Lab work showed elevated troponin.  Due to his poor quality of life and prognosis, palliative care consulted.  Family now wishing comfort care.  Most likely the plan will be for residential hospice placement  Assessment & Plan:  Principal Problem:   Delirium Active Problems:   Coronary artery disease of native artery of native heart with stable angina pectoris (HCC)   PAF (paroxysmal atrial fibrillation) (HCC)   Pure hypercholesterolemia   Elevated troponin   Hypertension   Acute delirium: History of recent herpes history of complex, left vision loss, shingles neuralgia.  Was falling frequently at home, hallucinating, agitated. CT head did not show any acute intracranial abnormalities.was on amitriptyline, trazodone, melatonin at home.  Psychiatry also consulted by admitting physician.    A-fib with RVR: Was taking Eliquis previously but has been held currently due to family's request.  On  metoprolol  for rate control  Elevated troponin/history of coronary artery disease: Cardiology was consulted initially.  After discussion with family, they do not wish any escalation of care.  Heparin drip was discussed initially but family declined.  Recent history of herpes zooster ophthalmicus: Was treated with IV acyclovir.  Lost vision in the left eye.  History of endocarditis: On prolonged antibiotic therapy with ampicillin twice a day  CKD stage IIIb-IV: Dehydrated on presentation, started on IV fluid.  Most likely he will be transitioned to comfort care  Hypertension: On  metoprolol  Hypokalemia: Supplemented with potassium  Goals of care: Elderly patient with multiple comorbidities now with delirium, hallucination, frequent falls.  Poor prognosis and quality of life.  Palliative care has been consulted.    CODE STATUS is DNR.  Long discussion held with the daughter at bedside today.  She understands her dad's prognosis and poor quality of life.  She she wants to talk to palliative care            DVT prophylaxis:heparin injection 5,000 Units Start: 08/26/21 1745     Code Status: DNR  Family Communication: daughter at bedside  Patient status:Inpatient  Patient is from :Home  Anticipated discharge to: Residential hospice  Estimated DC date: Awaiting palliative care discussion with family   Consultants: Palliative care, psychiatry  Procedures: None  Antimicrobials:  Anti-infectives (From admission, onward)    Start     Dose/Rate Route Frequency Ordered Stop   08/26/21 2200  amoxicillin (AMOXIL) capsule 500 mg       Note to Pharmacy: TAKE 1 TABLET(500 MG) BY MOUTH TWICE DAILY     500 mg Oral 2 times daily  08/26/21 1652         Subjective: Patient seen and examined at the bedside this morning.  Daughter at the bedside.  He was started agitated, lying in bed.  Looks extremity deconditioned, debilitated, confused.  Blind in the left eye.  Talks inappropriately,  mittens on both hands not in distress  Objective: Vitals:   08/26/21 1330 08/26/21 1528 08/26/21 1600 08/26/21 2013  BP: 108/64 (!) 142/86  112/79  Pulse: 98 70  90  Resp: (!) 21 18 19 16   Temp:  98.5 F (36.9 C)  98.4 F (36.9 C)  TempSrc:  Oral    SpO2: 100% 100%  98%    Intake/Output Summary (Last 24 hours) at 08/27/2021 0755 Last data filed at 08/27/2021 0541 Gross per 24 hour  Intake 1241.3 ml  Output 800 ml  Net 441.3 ml   There were no vitals filed for this visit.  Examination:  General exam: Confused elderly gentleman, deconditioned not in distress HEENT: Blind in the left eye Respiratory system:  no wheezes or crackles  Cardiovascular system: Irregularly irregular rhythm Gastrointestinal system: Abdomen is nondistended, soft and nontender. Central nervous system: Awake but not oriented Extremities: No edema, no clubbing ,no cyanosis Skin: No rashes, no ulcers,no icterus     Data Reviewed: I have personally reviewed following labs and imaging studies  CBC: Recent Labs  Lab 08/26/21 0713 08/27/21 0243  WBC 5.9 5.9  HGB 14.1 14.5  HCT 42.9 43.8  MCV 94.5 95.8  PLT 132* 129*   Basic Metabolic Panel: Recent Labs  Lab 08/26/21 0713 08/27/21 0243  NA 137 138  K 3.9 3.4*  CL 98 103  CO2 30 26  GLUCOSE 101* 122*  BUN 40* 29*  CREATININE 1.97* 1.81*  CALCIUM 9.3 8.5*     Recent Results (from the past 240 hour(s))  Culture, Urine     Status: None   Collection Time: 08/19/21  2:50 PM   Specimen: Urine  Result Value Ref Range Status   MICRO NUMBER: [id number]  Final   SPECIMEN QUALITY: Adequate  Final   Sample Source NOT GIVEN  Final   STATUS: FINAL  Final   Result: No Growth  Final     Radiology Studies: CT Head Wo Contrast  Result Date: 08/26/2021 CLINICAL DATA:  Altered mental status EXAM: CT HEAD WITHOUT CONTRAST TECHNIQUE: Contiguous axial images were obtained from the base of the skull through the vertex without intravenous contrast.  RADIATION DOSE REDUCTION: This exam was performed according to the departmental dose-optimization program which includes automated exposure control, adjustment of the mA and/or kV according to patient size and/or use of iterative reconstruction technique. COMPARISON:  10/23/2017 FINDINGS: Brain: No evidence of acute infarction, hemorrhage, hydrocephalus, extra-axial collection or mass lesion/mass effect. Multifocal nonacute encephalomalacia, involving the bilateral frontal lobes (series 2, image 19) and the left occipital lobe (series 2, image 17). Underlying periventricular and deep white matter hypodensity. Mild, global cerebral volume loss. Vascular: No hyperdense vessel or unexpected calcification. Skull: Normal. Negative for fracture or focal lesion. Sinuses/Orbits: No acute finding. Other: None. IMPRESSION: 1. No acute intracranial pathology. 2. Multifocal nonacute encephalomalacia, involving the bilateral frontal lobes and the left occipital lobe. 3. Small-vessel white matter disease and mild, global cerebral volume loss in keeping with advanced patient age. Electronically Signed   By: Jearld Lesch M.D.   On: 08/26/2021 08:30   DG Chest Port 1 View  Result Date: 08/26/2021 CLINICAL DATA:  Altered mental status EXAM: PORTABLE CHEST 1  VIEW COMPARISON:  04/28/2018 FINDINGS: Cardiomegaly with aortic valve stent endograft and left chest multi lead pacer. Both lungs are clear. The visualized skeletal structures are unremarkable. IMPRESSION: Cardiomegaly without acute abnormality of the lungs in AP portable projection. Electronically Signed   By: Jearld Lesch M.D.   On: 08/26/2021 08:28    Scheduled Meds:  amitriptyline  25 mg Oral QHS   amoxicillin  500 mg Oral BID   atorvastatin  40 mg Oral QPM   erythromycin  1 application. Left Eye QHS   heparin  5,000 Units Subcutaneous Q8H   latanoprost  1 drop Both Eyes QHS   melatonin  3 mg Oral QHS   metoprolol succinate  100 mg Oral Daily   Netarsudil  Dimesylate  1 drop Both Eyes QHS   ofloxacin  1 drop Left Eye TID   prednisoLONE acetate  1 drop Left Eye BID   traZODone  25 mg Oral QHS   Continuous Infusions:  sodium chloride 100 mL/hr at 08/27/21 0357     LOS: 1 day   Burnadette Pop, MD [facility] P3/28/2023, 7:55 AM
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
## 2021-08-27 NOTE — Progress Notes (Signed)
The patient is injury-free, afebrile, disoriented X 3. Vital signs were within the baseline during this shift. He was confused and irritated off and on throughout the shift. No s/s of acute distress. Sitter remains at bedside for safety. We will continue to monitor and work toward achieving the care plan goals. ?

## 2021-08-27 NOTE — Telephone Encounter (Signed)
Left message to return phone call.

## 2021-08-27 NOTE — Plan of Care (Signed)
?  Problem: Education: ?Goal: Knowledge of General Education information will improve ?Description: Including pain rating scale, medication(s)/side effects and non-pharmacologic comfort measures ?Outcome: Progressing ?  ?Problem: Health Behavior/Discharge Planning: ?Goal: Ability to manage health-related needs will improve ?Outcome: Not Progressing ?  ?Problem: Clinical Measurements: ?Goal: Ability to maintain clinical measurements within normal limits will improve ?Outcome: Progressing ?Goal: Will remain free from infection ?Outcome: Progressing ?Goal: Diagnostic test results will improve ?Outcome: Progressing ?Goal: Respiratory complications will improve ?Outcome: Progressing ?Goal: Cardiovascular complication will be avoided ?Outcome: Progressing ?  ?Problem: Activity: ?Goal: Risk for activity intolerance will decrease ?Outcome: Not Progressing ?  ?Problem: Nutrition: ?Goal: Adequate nutrition will be maintained ?Outcome: Progressing ?  ?Problem: Coping: ?Goal: Level of anxiety will decrease ?Outcome: Not Progressing ?  ?Problem: Elimination: ?Goal: Will not experience complications related to bowel motility ?Outcome: Progressing ?Goal: Will not experience complications related to urinary retention ?Outcome: Progressing ?  ?Problem: Pain Managment: ?Goal: General experience of comfort will improve ?Outcome: Progressing ?  ?Problem: Safety: ?Goal: Ability to remain free from injury will improve ?Outcome: Not Progressing ?  ?Problem: Skin Integrity: ?Goal: Risk for impaired skin integrity will decrease ?Outcome: Progressing ?  ?

## 2021-08-28 DIAGNOSIS — R410 Disorientation, unspecified: Secondary | ICD-10-CM | POA: Diagnosis not present

## 2021-08-28 DIAGNOSIS — Z7189 Other specified counseling: Secondary | ICD-10-CM | POA: Diagnosis not present

## 2021-08-28 DIAGNOSIS — R54 Age-related physical debility: Secondary | ICD-10-CM | POA: Diagnosis not present

## 2021-08-28 DIAGNOSIS — B0230 Zoster ocular disease, unspecified: Secondary | ICD-10-CM | POA: Diagnosis not present

## 2021-08-28 MED ORDER — GUAIFENESIN 100 MG/5ML PO LIQD
5.0000 mL | ORAL | Status: DC | PRN
  Filled 2021-08-28: qty 5

## 2021-08-28 MED ORDER — DIVALPROEX SODIUM 125 MG PO CSDR
250.0000 mg | DELAYED_RELEASE_CAPSULE | Freq: Two times a day (BID) | ORAL | Status: DC
  Administered 2021-08-28 – 2021-08-29 (×2): 250 mg via ORAL
  Filled 2021-08-28 (×3): qty 2

## 2021-08-28 NOTE — Progress Notes (Addendum)
Pt noted to be resistive to medication administration at bedtime. Pt's son in law contacted to assist with process, pt compliant once speaking with son. Pt slept well after meds given, until approximately 3am. Calling out names known to himself, talking to unseen stimuli. Pt noted to be restless and agitated intermittently, combative and physically aggressive with personal care and redirection of staff when attempting to get out of bed unassisted. Pt calm at this time.  ?
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
## 2021-08-28 NOTE — Telephone Encounter (Signed)
Santiago Glad stated that she spoke with [facility] after returning call yesterday. No further action needed. ?

## 2021-08-28 NOTE — Hospital Course (Addendum)
Patient is a 88 year old male with history of recent herpes zoster ophthalmicus with vision loss of left eye, multivessel coronary disease status post RCA stenting, paroxysmal A-fib on Eliquis, CKD stage IIIb, hypertension, hyperlipidemia, OSA noncompliant with CPAP who was brought from home by family for the evaluation of confusion.  He was admitted last month for the treatment of herpes zoster opthalmicus, received IV acyclovir and oral antibiotics for periorbital cellulitis and was discharged home.  Patient eventually lost his left eye vision about 3 weeks prior to this admission.  Since then his mental status deteriorated with confusion, delirium, hallucination, frequent falls.  Was on several antipsychotic medications at home.  On presentation, he was agitated, was in rapid A-fib.  CT head did not show any acute intracranial pathology.  UA was negative for UTI, chest x-ray did not show any pneumonia.  Lab work showed elevated troponin.  Due to his poor quality of life and prognosis, palliative care consulted.  Family now wishing comfort care.  Hospice referral was made.  Plan is to go home with hospice services ?

## 2021-08-28 NOTE — Progress Notes (Signed)
This chaplain responded to PMT consult for EOL spiritual care. The chaplain observed the Pt. Daughter-[name]'s ability to comfort the Pt. through touch and a soft voice. ? ?The chaplain provided spiritual support for the Pt. daughter-[name] through empathic listening and presence. The chaplain understands the Pt. is Catholic. The family emotionally requests "anointing of the sick" for the Pt.. The chaplain understands Father Barnabas Lister will visit the Pt. today at 4pm. ? ?This chaplain is available for F/U spiritual care as needed for the Pt. and family. The chaplain provided education to [facility] on how to receive grief care through Hospice. ? ?Chaplain Sallyanne Kuster ?[phone] ?
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
## 2021-08-28 NOTE — Consult Note (Addendum)
[facility] New Face-to-Face Psychiatric Evaluation ? ? ?Service Date: August 28, 2021 ?LOS:  LOS: 2 days  ? ? ?Assessment  ?TYRAE ALCOSER is a 88 y.o. male admitted medically for 08/26/2021  6:34 AM for delirium with failure of outpt management. He has no premorbid psychiatric history. Marland Kitchen He carries the psychiatric diagnoses of  and has an extensive past medical history most significant for recent loss of sight in one eye and 30% vision in the other (please see EMR for full medicla history); importantly was in fairly good physical health prior to this (no memory issues, walking, completing ADLs) .Psychiatry was consulted for management of agitation by Shelly Coss, MD.  ? ?At this time, the patient's presentation is most consistent with mixed delirium, most likely due to multiple etiologies including but not limited to infection, medications, pain, altered sleep/wake cycle, and limited mobility. Has been quite aggressive per report but was sleeping through the majority of my assessment. The patient would strongly benefit from medical treatment of any underlying medical cause of delirium. During this time period, minimization of delirogenic insults will be of utmost importance; this includes promoting the normal circadian cycle, minimizing lines/tubes, avoiding deliriogenic medications such as benzodiazepines and anticholinergic medications, and frequently reorienting the patient. Symptomatic treatment for agitation can be provided by antipsychotic medications, though it is important to remember that these do not treat the underlying etiology of delirium. Notably, there can be a time lag effect between treatment of a medical problem and resolution of delirium. This time lag effect may be of longer duration in the elderly, and those with underlying cognitive impairment or brain injury. 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 For these reasons will largely focus on optimizing sleep cycle and treating aggression; discussed r/b/se of medication plan below with daughter at bedside.  ? ?3/29: Pt had a good night after some agitation around taking pills. Seemed alert to daughter this AM and even ate some food; became more upset/confused after needing to go to the bathroom. Sleeping peacefully when I saw. Will discharge to home hospice.  ? ? ?Diagnoses:  ?Active Hospital problems: ?Principal Problem: ?  Delirium ?Active Problems: ?  Coronary artery disease of native artery of native heart with stable angina pectoris ([place]) ?  PAF (paroxysmal atrial fibrillation) ([place]) ?  Pure hypercholesterolemia ?  Elevated troponin ?  Hypertension ?  Acute renal failure superimposed on stage 4 chronic kidney disease ([place]) ?  History of endocarditis ?  Herpes zoster ophthalmicus ?  Goals of care, counseling/discussion ?  ? ? ?Plan  ?## Safety and Observation Level:  ?- Based on my clinical evaluation, I estimate the patient to be at high risk of unintentional self harm in the current setting due to delirium ?-  Will defer to primary team; has benefitted from sitter thus far in admission.  ? ? ?## Medical Decision Making Capacity:  ?Not formally assessed ? ?## Medications ?-- s depakote 250 mg BID (OK to convert to IV or sprinkles if pt refuses PO) ? - have switched to depakote sprinkles for easier administration after pt discharged ?-- s quetiapine 50 mg QHS; have put in additional 25 mg PRN dose for pt being awake  between 10 and 6 and/or first line for agitation if pt can take oral meds ?- change melatonin from 10 PM to 6 PM ?- STOP elavil ?- STOP trazodone ? ?#Future changes to consider: ?Could consider  increasing depakote to 250/500 or even 500/500. Limited utility for depakote level in pt on hospice (unlikely to get toxic or supratheraputic in this dosage range).  ? ?In outpt setting, could consider ordering ABH cream from compounding pharmacy although data is mixed.  ? ? ?## Further Work-up:  ?-- Defer to primary - family's main focus is sx control and comfort ?Briefly discussed EEG v LP with primary team (likely low yield and invasive); daughter had declined these interventions preferring to focus on sx ? ?-- most recent EKG on 3/27 had QtC of 444 ?-- Pertinent labwork reviewed earlier this admission includes: CMP (albumin wnl, AKI), troponin (elevated), u/a  (no evidence of UTI)  ? ?-- had TSH in 2/23 which was WNL ? ?-- other labs such as B12, RPR, etc do not fit clinical picture of rapid decline with known precipitant.  ? ?## Disposition:  ?-- per primary/palliative, likely home hospice ? ?## Behavioral / Environmental:  ?-- Delirium Interventions for Nursing and Staff: ?- RN to open blinds every AM. ?- To Bedside: Glasses, hearing aide, and pt's own shoes. Make available to patients. when possible and encourage use. ?- Encourage po fluids when appropriate, keep fluids within reach. ?- OOB to chair with meals. ?- Passive ROM exercises to all extremities with AM & PM care. ?- RN to assess orientation to person, time and place QAM and PRN. ?- Recommend extended visitation hours with familiar family/friends as feasible. ?- Staff to minimize disturbances at night. Turn off television when pt asleep or when not in use. ? ?##Legal Status ? ? ?Thank you for this consult request. Recommendations have been communicated to the primary team.  We will sign off at this time.  ? ?Jonuel Butterfield A Dade Rodin ? ? ?NEW  history  ?Relevant Aspects of Hospital Course:  ?Admitted on 08/26/2021 for delirium with failure of outpt management. From H&P: ? ?Last month, patient was admitted to the hospital for herpes zoster ophthalmicus,  patient received treatment IV acyclovir x7 days, and p.o. antibiotics for periorbital cellulitis.  Patient was discharged home, however patient left-sided vision eventually lost about 3 weeks ago.  Since then the patient has had deteriorated mentations, initially was sleep cycle disturbance, for which patient has tried several medications including Ativan, risperidone, haloperidol.  Condition continued to deteriorate despite all above medications.  Patient also started to have visual hallucinations and has unable to recognize family members.  Family reported the patient appears to have discomfort with left-sided eye pain and sometimes noted patient scratch scalp and pulling hair.  And for the last couple of days, patient has had frequent falls, and had to stay in bed.  Wondering about patient worsening ambulation status, family has stopped patient Eliquis since yesterday.  Outpatient palliative care also consulted. ? ?Notably pt with poor vision in R eye. Prior to above, had no history of dementia, normal mental status, and able to effectively ambulate with a walker.  ? ?Patient Report:  ?Pt seen in late AM; he naps through entirety of interview. Slept well last night after medication changes although had to be coached to take meds by son in law. Was up through most of AM talking to daughter (conversations were "interesting") but was not agitated until he was taken to the bathroom. Daughter and  son in law will be paying for private hospice; clarified there will not be IV access. Discussed changing meds to depakote sprinkles for easier administration after he leaves the hospital and potential futrue changes above.  ?  ?ROS:  ?See above ? ?Collateral information:  ?RN overnight note entered 0600 3/28: ?The patient is injury-free, afebrile, disoriented X 3. Vital signs were within the baseline during this shift. He was confused and irritated off and on throughout the shift. No s/s of acute distress. Sitter remains at  bedside for safety. We will continue to monitor and work toward achieving the care plan goals. ? ?Psychiatric History:  ?Information collected from pt, family, medical record ?Pt with no personal  ? ? ?Family p

## 2021-08-28 NOTE — Assessment & Plan Note (Addendum)
On metoprolol

## 2021-08-28 NOTE — Progress Notes (Signed)
? ? ?   ?  I have followed clinical course peripherally    Please call with questions   will sign off. ? ?  Signed, ?Dorris Carnes, MD  ?08/28/2021, 2:16 PM   ? ?

## 2021-08-28 NOTE — Progress Notes (Addendum)
[facility]) Hospital Liaison note: ? ?Received request from [place] for hospice services at home after discharge. Chart and patient information reviewed by [facility] physician. Hospice eligibility is confirmed. ? ?Spoke with daughter [name] to initiate education related to hospice philosophy, services and team approach to care. [name] verbalized understanding of information given. Per discussion, the plan is for discharge by [facility] on 08/29/21. ? ?DME needs discussed. Patient has the following equipment in the home: transport wheelchair; traditional walker, rolling walker, shower bench and bedside commode. Family requests the following equipment for delivery: Hospital bed, overbed table and oxygen concentrator (2L/min). Address is correct in the chart and has been verified. [name] [phone] is the family contact to arrange time of equipment delivery. ? ?Please send signed and completed DNR home with patient/family. Please provide prescriptions at discharge as needed to ensure ongoing symptom management.  ? ?[facility] information and contact numbers given to [employer]. Above information shared with Whitman Hero, RN TOC. Please call with any questions or concerns. ? ?Thank you for the opportunity to participate in this patient's care. ? ?Lorelee Market, LPN ?[facility] Liaison ?[phone] ?

## 2021-08-28 NOTE — Assessment & Plan Note (Addendum)
Patient was gently hydrated.  Now transitioned to hospice/comfort care ?

## 2021-08-28 NOTE — Assessment & Plan Note (Addendum)
History of recent HZO, left vision loss, shingles neuralgia.  Was falling frequently at home, hallucinating, agitated. ?CT head did not show any acute intracranial abnormalities. Was on amitriptyline, trazodone, melatonin at home.  Psychiatry also consulted. ?Patient subsequently seen by palliative care.  Oral intake has been poor.  Family wants to transition to hospice at home. ?Hospice referral was made.  DME ordered.  Okay for discharge home today.  Discussed with daughter. ?

## 2021-08-28 NOTE — Assessment & Plan Note (Addendum)
Was on Eliquis previously.  Anticoagulation on hold now since the plan is for transition to hospice.  May resume metoprolol at a lower dose. ?

## 2021-08-28 NOTE — Assessment & Plan Note (Signed)
Seen by palliative care.  Discussions held with the patient's daughter.  Plan is for transition to hospice at home.  Prognosis is poor. ?

## 2021-08-28 NOTE — Assessment & Plan Note (Signed)
History of coronary artery disease ? ?Seen by cardiology.  After discussions with family they did not wish any escalation of care. ?

## 2021-08-28 NOTE — Assessment & Plan Note (Signed)
Was treated with IV acyclovir during previous admission.  Lost vision in the left eye. ?

## 2021-08-28 NOTE — Progress Notes (Addendum)
NCM received secured chat from bedside nurse. Nurse informed NCM pt's daughter would like to speak with her. NCM called pt's daughter, Ryan [name]. [name] informed NCM family would like pt to d/c to home with hospice. States pt is active with [employer] [facility] for palliative services and would like to use them for home hospice care,  NCM made MD aware. MD states he will reach out palliative to help with disposition. ?[name] (Daughter)      ?[phone]     ?TOC monitoring and will assist with TOC needs. ? ?08/28/2021 0845 Referral made for home hospice care with [facility] with [facility]. Ryan [name] to f/u with daughter, Ryan [name]. ?

## 2021-08-28 NOTE — Assessment & Plan Note (Addendum)
Was on long-term antibiotics with ampicillin twice a day.  Okay to discontinue now since focus is now mainly on comfort. ?

## 2021-08-29 MED ORDER — MELATONIN 3 MG PO TABS: 3.0000 mg | ORAL_TABLET | Freq: Every day | ORAL | 0 refills | Status: AC

## 2021-08-29 MED ORDER — HYDROMORPHONE HCL 1 MG/ML PO LIQD: 0.5000 mg | ORAL | 0 refills | Status: DC | PRN

## 2021-08-29 MED ORDER — HYDROMORPHONE HCL 2 MG PO TABS: 1.0000 mg | ORAL_TABLET | ORAL | 0 refills | Status: AC | PRN

## 2021-08-29 MED ORDER — DIVALPROEX SODIUM 125 MG PO CSDR: 250.0000 mg | DELAYED_RELEASE_CAPSULE | Freq: Two times a day (BID) | ORAL | 0 refills | Status: DC

## 2021-08-29 MED ORDER — QUETIAPINE FUMARATE 25 MG PO TABS: 25.0000 mg | ORAL_TABLET | Freq: Two times a day (BID) | ORAL | 0 refills | Status: AC | PRN

## 2021-08-29 MED ORDER — METOPROLOL TARTRATE 50 MG PO TABS
50.0000 mg | ORAL_TABLET | Freq: Two times a day (BID) | ORAL | Status: DC
  Administered 2021-08-29: 50 mg via ORAL
  Filled 2021-08-29: qty 1

## 2021-08-29 MED ORDER — DIVALPROEX SODIUM 250 MG PO DR TAB: 250.0000 mg | DELAYED_RELEASE_TABLET | Freq: Two times a day (BID) | ORAL | 1 refills | Status: AC

## 2021-08-29 MED ORDER — METOPROLOL TARTRATE 50 MG PO TABS: 50.0000 mg | ORAL_TABLET | Freq: Two times a day (BID) | ORAL | 1 refills | Status: AC

## 2021-08-29 MED ORDER — GABAPENTIN 100 MG PO CAPS
100.0000 mg | ORAL_CAPSULE | Freq: Three times a day (TID) | ORAL | 0 refills | Status: AC
Start: 2021-08-29 — End: ?

## 2021-08-29 MED ORDER — QUETIAPINE FUMARATE 50 MG PO TABS: 50.0000 mg | ORAL_TABLET | Freq: Every day | ORAL | 0 refills | Status: AC

## 2021-08-29 MED ORDER — GUAIFENESIN 100 MG/5ML PO LIQD: 5.0000 mL | ORAL | 0 refills | Status: AC | PRN

## 2021-08-29 NOTE — Plan of Care (Signed)

## 2021-08-29 NOTE — Consult Note (Addendum)
? ?  [facility] CM Inpatient Consult ? ? ?08/29/2021 ? ?[name] ?08/05/1929 ?[id number] ? ?[facility] Organization [ACO] Patient: [employer] ? ?Primary Care Provider:  Dorothyann Peng, NP, is an Embedded Provider with [facility] [place] and Embedded provider ? ? ?Chart reviewed for less than 30 days readmission and reveals the patient is currently transitioning to Hospice/Palliative Care.  ? ?Plan: Patient's post hospital follow up will be with full case management services through Hospice and needs will be met at the hospice level of care. ? ?Natividad Brood, RN BSN CCM ?[facility] Hospital Liaison ? [phone] business mobile phone ?Toll free office [phone]  ?Fax number: [phone] ?[email or web address] ?[email or web address] ?  ? ?

## 2021-08-29 NOTE — Care Management Important Message (Signed)
Important Message ? ?Patient Details  ?Name: Ryan [name] ?MRN: [id number] ?Date of Birth: 09-19-1929 ? ? ?Medicare Important Message Given:  Yes ? ? ? ? ?Levada Dy  Salley Boxley-Martin ?08/29/2021, 2:02 PM ?

## 2021-08-29 NOTE — Progress Notes (Signed)
Patient Ryan [name]      DOB: [birth year]      ZGY:[id number] ? ? ? ?  ?Palliative Medicine Team ? ? ? ?Subjective: Bedside symptom check. Daughter Ryan [name] bedside during this visit.  ? ? ?Physical exam: Patient resting in bed with eyes open at time of visit. Daughter assisting patient to eat and drink with safety precautions in place. Patient cooperative with chin tuck and cough clearance cues. Patient enjoying cookies and milk for breakfast. Patient breathing even and non-labored with nasal cannula in place. No excessive secretions noted. Patient without physical or non-verbal signs of pain or discomfort at this time.  ? ? ?Assessment and plan: Plan is for patient to go home with hospice and additional care support today. Ryan [name] noted that they expect evaluation from outside company this morning and discharge to take place after that. She did ask that I reach out to the [facility] hospice team for more details on outpatient plan. I sent a message on her behalf to [facility] and [name] to follow up. She also asked if I would confirm the addition of metopralol medication to discharge prescriptions for rate control/comfort at home. Confirmed with [name] of this. Ryan [name] denies any other needs or concerns at this time. All home equipment was delivered yesterday and set up in preparation for discharge. Will continue to follow for any changes or advances in care.   ? ? ?Thank you for allowing the Palliative Medicine Team to assist in the care of this patient. ?  ?  ?Damian Leavell, MSN, RN ?Palliative Medicine Team ?Team Phone: [phone]  ?This phone is monitored 7a-7p, please reach out to attending physician outside of these hours for urgent needs.   ?

## 2021-08-29 NOTE — Progress Notes (Signed)
Discussed with the daughter and all questioned fully answered. Discussed medications in detail and daughter reports appointments in Epic will be cancelled.  Patient being transferred via [facility] to his home to receive hospice care.  ? ?

## 2021-08-29 NOTE — TOC Transition Note (Signed)
Transition of Care (TOC) - CM/SW Discharge Note ? ? ?Patient Details  ?Name: Ryan [name] ?MRN: [id number] ?Date of Birth: Sep 05, 1929 ? ?Transition of Care [facility]) CM/SW Contact:  ?Sharin Mons, RN ?Phone Number: ?08/29/2021, 11:00 AM ? ? ?Clinical Narrative:    ?Patient will DC to: home ?Anticipated DC date: 08/29/2021 ?Family notified: yes, [name] ?Transport by: Corey Harold ? ? ?Per MD patient ready for DC today. RN, patient, patient's family, and [employer] [facility] notified of DC. Transportation and DNR paper on chart.  [facility] ambulance transport requested for patient.  ? ?RNCM will sign off for now as intervention is no longer needed. Please consult [place] again if new needs arise.  ? ? ?Final next level of care: [place] ?Barriers to Discharge: No Barriers Identified ? ? ?Patient Goals and CMS Choice ?  ?  ?Choice offered to / list presented to : Adult Children ? ?Discharge Placement ?  ?           ?  ?  ?  ?  ? ?Discharge Plan and Services ?  ?  ?           ?  ?  ?  ?  ?  ?  ?  ?  ?  ?  ? ?Social Determinants of Health (SDOH) Interventions ?  ? ? ?Readmission Risk Interventions ?   ? View : No data to display.  ?  ?  ?  ? ? ? ? ? ?

## 2021-08-29 NOTE — Discharge Summary (Signed)
?[facility] ? ?Physician Discharge Summary  ? ?Patient ID: ?Ryan [name] ?MRN: [id number] ?DOB/AGE: 86/25/1931 86 y.o. ? ?Admit date: 08/26/2021 ?Discharge date:   08/29/2021 ? ? ?PCP: Dorothyann Peng, NP ? ?DISCHARGE DIAGNOSES:  ?Principal Problem: ?  Delirium ?Active Problems: ?  PAF (paroxysmal atrial fibrillation) ([place]) ?  Elevated troponin ?  Acute renal failure superimposed on stage 4 chronic kidney disease ([place]) ?  Herpes zoster ophthalmicus ?  History of endocarditis ?  Hypertension ?  Coronary artery disease of native artery of native heart with stable angina pectoris ([place]) ?  Pure hypercholesterolemia ?  Goals of care, counseling/discussion ? ? ?RECOMMENDATIONS FOR OUTPATIENT FOLLOW UP: ?Home with hospice services ? ?CODE STATUS: DNR ?  ? ?DISCHARGE CONDITION: fair ? ?Diet recommendation: Comfort feeds as tolerated ? ?INITIAL HISTORY: ?Patient is a 88 year old male with history of recent herpes zoster ophthalmicus with vision loss of left eye, multivessel coronary disease status post RCA stenting, paroxysmal A-fib on Eliquis, CKD stage IIIb, hypertension, hyperlipidemia, OSA noncompliant with CPAP who was brought from home by family for the evaluation of confusion.  He was admitted last month for the treatment of herpes zoster opthalmicus, received IV acyclovir and oral antibiotics for periorbital cellulitis and was discharged home.  Patient eventually lost his left eye vision about 3 weeks prior to this admission.  Since then his mental status deteriorated with confusion, delirium, hallucination, frequent falls.  Was on several antipsychotic medications at home.  On presentation, he was agitated, was in rapid A-fib.  CT head did not show any acute intracranial pathology.  UA was negative for UTI, chest x-ray did not show any pneumonia.  Lab work showed elevated troponin.  Due to his poor quality of life and prognosis, palliative care consulted.  Family now wishing comfort care.    ? ?Consultations: ?Cardiology ?Palliative care ? ? ?HOSPITAL COURSE:  ? ?* Delirium ?History of recent HZO, left vision loss, shingles neuralgia.  Was falling frequently at home, hallucinating, agitated. ?CT head did not show any acute intracranial abnormalities. Was on amitriptyline, trazodone, melatonin at home.  Psychiatry also consulted. ?Patient subsequently seen by palliative care.  Oral intake has been poor.  Family wants to transition to hospice at home. ?Hospice referral was made.  DME ordered.  Okay for discharge home today.  Discussed with daughter. ? ?Elevated troponin ?History of coronary artery disease ? ?Seen by cardiology.  After discussions with family they did not wish any escalation of care. ? ?PAF (paroxysmal atrial fibrillation) ([place]) ?Was on Eliquis previously.  Anticoagulation on hold now since the plan is for transition to hospice.  May resume metoprolol at a lower dose. ? ?Acute renal failure superimposed on stage 4 chronic kidney disease ([place]) ?Patient was gently hydrated.  Now transitioned to hospice/comfort care ? ?Herpes zoster ophthalmicus ?Was treated with IV acyclovir during previous admission.  Lost vision in the left eye. ? ?History of endocarditis ?Was on long-term antibiotics with ampicillin twice a day.  Okay to discontinue now since focus is now mainly on comfort. ? ?Hypertension ?On metoprolol ? ?Goals of care, counseling/discussion ?Seen by palliative care.  Discussions held with the patient's daughter.  Plan is for transition to hospice at home.  Prognosis is poor. ? ? ? ?Okay for discharge home with hospice services. ? ? ?PERTINENT LABS: ? ?The results of significant diagnostics from this hospitalization (including imaging, microbiology, ancillary and laboratory) are listed below for reference.   ? ?Microbiology: ?Recent Results (from the past 240 hour(s))  ?  Culture, Urine     Status: None  ? Collection Time: 08/19/21  2:50 PM  ? Specimen: Urine  ?Result Value Ref Range  Status  ? MICRO NUMBER: [id number]  Final  ? SPECIMEN QUALITY: Adequate  Final  ? Sample Source NOT GIVEN  Final  ? STATUS: FINAL  Final  ? Result: No Growth  Final  ?  ? ?Labs: ? ?COVID-19 Labs ? ?Lab Results  ?Component Value Date  ? [place] NEGATIVE 07/27/2021  ? [place] NEGATIVE 03/04/2020  ? ? ? ? ?Basic Metabolic Panel: ?Recent Labs  ?Lab 08/26/21 ?0713 08/27/21 ?0243  ?NA 137 138  ?K 3.9 3.4*  ?CL 98 103  ?CO2 30 26  ?GLUCOSE 101* 122*  ?BUN 40* 29*  ?CREATININE 1.97* 1.81*  ?CALCIUM 9.3 8.5*  ? ?Liver Function Tests: ?Recent Labs  ?Lab 08/26/21 ?0713  ?AST 32  ?ALT 25  ?ALKPHOS 63  ?BILITOT 0.8  ?PROT 5.9*  ?ALBUMIN 3.5  ? ? ?CBC: ?Recent Labs  ?Lab 08/26/21 ?0713 08/27/21 ?0243  ?WBC 5.9 5.9  ?HGB 14.1 14.5  ?HCT 42.9 43.8  ?MCV 94.5 95.8  ?PLT 132* 129*  ? ?Cardiac Enzymes: ?Recent Labs  ?Lab 08/26/21 ?9379  ?CKTOTAL 282  ? ? ? ?IMAGING STUDIES ?CT Head Wo Contrast ? ?Result Date: 08/26/2021 ?CLINICAL DATA:  Altered mental status EXAM: CT HEAD WITHOUT CONTRAST TECHNIQUE: Contiguous axial images were obtained from the base of the skull through the vertex without intravenous contrast. RADIATION DOSE REDUCTION: This exam was performed according to the departmental dose-optimization program which includes automated exposure control, adjustment of the mA and/or kV according to patient size and/or use of iterative reconstruction technique. COMPARISON:  10/23/2017 FINDINGS: Brain: No evidence of acute infarction, hemorrhage, hydrocephalus, extra-axial collection or mass lesion/mass effect. Multifocal nonacute encephalomalacia, involving the bilateral frontal lobes (series 2, image 19) and the left occipital lobe (series 2, image 17). Underlying periventricular and deep white matter hypodensity. Mild, global cerebral volume loss. Vascular: No hyperdense vessel or unexpected calcification. Skull: Normal. Negative for fracture or focal lesion. Sinuses/Orbits: No acute finding. Other: None. IMPRESSION: 1. No  acute intracranial pathology. 2. Multifocal nonacute encephalomalacia, involving the bilateral frontal lobes and the left occipital lobe. 3. Small-vessel white matter disease and mild, global cerebral volume loss in keeping with advanced patient age. Electronically Signed   By: Delanna Ahmadi M.D.   On: 08/26/2021 08:30  ? ?DG Chest Port 1 View ? ?Result Date: 08/26/2021 ?CLINICAL DATA:  Altered mental status EXAM: PORTABLE CHEST 1 VIEW COMPARISON:  04/28/2018 FINDINGS: Cardiomegaly with aortic valve stent endograft and left chest multi lead pacer. Both lungs are clear. The visualized skeletal structures are unremarkable. IMPRESSION: Cardiomegaly without acute abnormality of the lungs in AP portable projection. Electronically Signed   By: Delanna Ahmadi M.D.   On: 08/26/2021 08:28   ? ?DISCHARGE EXAMINATION: ?Vitals:  ? 08/27/21 0815 08/27/21 0934 08/27/21 1645 08/28/21 0500  ?BP: 125/70 129/83 (!) 126/57 (!) 160/66  ?Pulse: (!) 104  60 60  ?Resp: 17  12   ?Temp:   97.8 ?F (36.6 ?C) 98 ?F (36.7 ?C)  ?TempSrc:   Axillary   ?SpO2:   93% 99%  ? ?General appearance: Somnolent.  Easily arousable.  Distracted ?Resp: Clear to auscultation bilaterally.  Normal effort ?Cardio: S1-S2 is irregularly irregular.  Tachycardic. ?GI: Abdomen is soft.  Nontender nondistended.  Bowel sounds are present normal.  No masses organomegaly ? ? ?DISPOSITION: Home with daughter under hospice care ? ?Discharge Instructions   ? ?  Call MD for:  difficulty breathing, headache or visual disturbances   Complete by: As directed ?  ? Call MD for:  extreme fatigue   Complete by: As directed ?  ? Call MD for:  persistant dizziness or light-headedness   Complete by: As directed ?  ? Call MD for:  persistant nausea and vomiting   Complete by: As directed ?  ? Call MD for:  severe uncontrolled pain   Complete by: As directed ?  ? Call MD for:  temperature >100.4   Complete by: As directed ?  ? Diet - low sodium heart healthy   Complete by: As directed ?  ?  Discharge instructions   Complete by: As directed ?  ? You were cared for by a hospitalist during your hospital stay. If you have any questions about your discharge medications or the care you received while you were in the

## 2021-09-03 ENCOUNTER — Telehealth: Payer: Self-pay | Admitting: Adult Health

## 2021-09-03 NOTE — Telephone Encounter (Signed)
Spoke to patients daughter Ryan [name]) who informed me that Ryan [name] died on 09/09/2021. My condolences were given  ? ? ?

## 2021-09-04 ENCOUNTER — Ambulatory Visit: Payer: Medicare Other | Admitting: Physician Assistant

## 2021-09-05 ENCOUNTER — Other Ambulatory Visit: Payer: Medicare Other | Admitting: Family Medicine

## 2021-09-30 DEATH — deceased
